# Patient Record
Sex: Male | Born: 1950
Health system: Southern US, Community
[De-identification: ages and names within clinical notes are randomized; demographics above are authoritative.]

## PROBLEM LIST (undated history)

## (undated) DIAGNOSIS — I10 Essential (primary) hypertension: Secondary | ICD-10-CM

## (undated) DIAGNOSIS — C61 Malignant neoplasm of prostate: Secondary | ICD-10-CM

## (undated) DIAGNOSIS — E785 Hyperlipidemia, unspecified: Secondary | ICD-10-CM

## (undated) DIAGNOSIS — R972 Elevated prostate specific antigen [PSA]: Secondary | ICD-10-CM

## (undated) HISTORY — DX: Malignant neoplasm of prostate: C61

## (undated) HISTORY — DX: Elevated prostate specific antigen (PSA): R97.20

## (undated) HISTORY — DX: Essential (primary) hypertension: I10

## (undated) HISTORY — PX: TONSILLECTOMY: SUR1361

## (undated) HISTORY — DX: Hyperlipidemia, unspecified: E78.5

---

## 1998-09-30 ENCOUNTER — Ambulatory Visit (HOSPITAL_COMMUNITY): Admission: RE | Admit: 1998-09-30 | Discharge: 1998-09-30 | Payer: Self-pay | Admitting: Specialist

## 1998-09-30 ENCOUNTER — Encounter: Payer: Self-pay | Admitting: Specialist

## 1999-05-11 ENCOUNTER — Encounter: Payer: Self-pay | Admitting: Orthopedic Surgery

## 1999-05-18 ENCOUNTER — Encounter: Payer: Self-pay | Admitting: Orthopedic Surgery

## 1999-05-18 ENCOUNTER — Inpatient Hospital Stay (HOSPITAL_COMMUNITY): Admission: RE | Admit: 1999-05-18 | Discharge: 1999-05-23 | Payer: Self-pay | Admitting: Orthopedic Surgery

## 1999-12-07 HISTORY — PX: TOTAL HIP ARTHROPLASTY: SHX124

## 2000-12-15 ENCOUNTER — Other Ambulatory Visit: Admission: RE | Admit: 2000-12-15 | Discharge: 2000-12-15 | Payer: Self-pay | Admitting: Internal Medicine

## 2004-12-28 ENCOUNTER — Ambulatory Visit: Payer: Self-pay | Admitting: Internal Medicine

## 2005-02-15 ENCOUNTER — Ambulatory Visit: Payer: Self-pay | Admitting: Gastroenterology

## 2005-02-26 ENCOUNTER — Ambulatory Visit: Payer: Self-pay | Admitting: Gastroenterology

## 2005-02-26 ENCOUNTER — Encounter: Payer: Self-pay | Admitting: Internal Medicine

## 2005-03-22 ENCOUNTER — Ambulatory Visit: Payer: Self-pay | Admitting: Internal Medicine

## 2005-03-29 ENCOUNTER — Ambulatory Visit: Payer: Self-pay | Admitting: Internal Medicine

## 2005-11-05 ENCOUNTER — Ambulatory Visit: Payer: Self-pay | Admitting: Internal Medicine

## 2005-12-06 HISTORY — PX: KNEE ARTHROSCOPY: SUR90

## 2006-03-01 ENCOUNTER — Ambulatory Visit: Payer: Self-pay | Admitting: Internal Medicine

## 2006-03-17 ENCOUNTER — Ambulatory Visit: Payer: Self-pay | Admitting: Internal Medicine

## 2006-09-06 ENCOUNTER — Ambulatory Visit: Payer: Self-pay | Admitting: Internal Medicine

## 2006-09-13 ENCOUNTER — Ambulatory Visit: Payer: Self-pay | Admitting: Internal Medicine

## 2006-12-06 HISTORY — PX: OTHER SURGICAL HISTORY: SHX169

## 2006-12-06 HISTORY — PX: PROSTATE BIOPSY: SHX241

## 2007-01-04 ENCOUNTER — Ambulatory Visit: Payer: Self-pay | Admitting: Internal Medicine

## 2007-01-04 LAB — CONVERTED CEMR LAB: Testosterone: 345.1 ng/dL — ABNORMAL LOW (ref 350.00–890)

## 2007-01-10 ENCOUNTER — Ambulatory Visit: Payer: Self-pay | Admitting: Internal Medicine

## 2007-01-11 LAB — CONVERTED CEMR LAB
FSH: 9.5 milliintl units/mL
LH: 13.1 milliintl units/mL
Prolactin: 11.5 ng/mL

## 2007-01-27 ENCOUNTER — Ambulatory Visit: Payer: Self-pay | Admitting: Internal Medicine

## 2007-03-14 ENCOUNTER — Ambulatory Visit: Payer: Self-pay | Admitting: Internal Medicine

## 2007-03-14 LAB — CONVERTED CEMR LAB
ALT: 35 units/L (ref 0–40)
AST: 35 units/L (ref 0–37)
Albumin: 4 g/dL (ref 3.5–5.2)
Alkaline Phosphatase: 47 units/L (ref 39–117)
BUN: 21 mg/dL (ref 6–23)
Basophils Absolute: 0 10*3/uL (ref 0.0–0.1)
Basophils Relative: 0.1 % (ref 0.0–1.0)
Bilirubin, Direct: 0.2 mg/dL (ref 0.0–0.3)
CO2: 32 meq/L (ref 19–32)
Calcium: 9.4 mg/dL (ref 8.4–10.5)
Chloride: 110 meq/L (ref 96–112)
Cholesterol: 110 mg/dL (ref 0–200)
Creatinine, Ser: 1 mg/dL (ref 0.4–1.5)
Eosinophils Absolute: 0.1 10*3/uL (ref 0.0–0.6)
Eosinophils Relative: 2.5 % (ref 0.0–5.0)
GFR calc Af Amer: 99 mL/min
GFR calc non Af Amer: 82 mL/min
Glucose, Bld: 93 mg/dL (ref 70–99)
HCT: 45.6 % (ref 39.0–52.0)
HDL: 46.1 mg/dL (ref >39.0)
Hemoglobin: 15.7 g/dL (ref 13.0–17.0)
LDL Cholesterol: 51 mg/dL (ref 0–99)
Lymphocytes Relative: 25.3 % (ref 12.0–46.0)
MCHC: 34.3 g/dL (ref 30.0–36.0)
MCV: 94.1 fL (ref 78.0–100.0)
Monocytes Absolute: 0.5 10*3/uL (ref 0.2–0.7)
Monocytes Relative: 10.4 % (ref 3.0–11.0)
Neutro Abs: 3.1 10*3/uL (ref 1.4–7.7)
Neutrophils Relative %: 61.7 % (ref 43.0–77.0)
PSA: 4.06 ng/mL — ABNORMAL HIGH (ref 0.10–4.00)
Platelets: 242 10*3/uL (ref 150–400)
Potassium: 4.2 meq/L (ref 3.5–5.1)
RBC: 4.85 M/uL (ref 4.22–5.81)
RDW: 13 % (ref 11.5–14.6)
Sodium: 149 meq/L — ABNORMAL HIGH (ref 135–145)
TSH: 1.76 microintl units/mL (ref 0.35–5.50)
Total Bilirubin: 1.1 mg/dL (ref 0.3–1.2)
Total CHOL/HDL Ratio: 2.4
Total Protein: 7.1 g/dL (ref 6.0–8.3)
Triglycerides: 64 mg/dL (ref 0–149)
VLDL: 13 mg/dL (ref 0–40)
WBC: 4.9 10*3/uL (ref 4.5–10.5)

## 2007-03-21 ENCOUNTER — Ambulatory Visit: Payer: Self-pay | Admitting: Internal Medicine

## 2007-03-21 LAB — CONVERTED CEMR LAB
PSA, Free Pct: 26 (ref >25)
PSA, Free: 1.2 ng/mL
PSA: 4.64 ng/mL — ABNORMAL HIGH (ref 0.10–4.00)

## 2007-03-31 ENCOUNTER — Ambulatory Visit: Payer: Self-pay | Admitting: Internal Medicine

## 2007-03-31 LAB — CONVERTED CEMR LAB
PSA, Free Pct: 19 — ABNORMAL LOW (ref >25)
PSA, Free: 0.7 ng/mL
PSA: 3.77 ng/mL (ref 0.10–4.00)

## 2007-05-05 ENCOUNTER — Ambulatory Visit: Payer: Self-pay | Admitting: Internal Medicine

## 2007-05-05 LAB — CONVERTED CEMR LAB
PSA, Free Pct: 18 — ABNORMAL LOW (ref >25)
PSA, Free: 0.7 ng/mL
PSA: 3.99 ng/mL (ref 0.10–4.00)

## 2007-07-06 DIAGNOSIS — E785 Hyperlipidemia, unspecified: Secondary | ICD-10-CM | POA: Insufficient documentation

## 2007-07-11 ENCOUNTER — Ambulatory Visit: Payer: Self-pay | Admitting: Internal Medicine

## 2007-07-11 LAB — CONVERTED CEMR LAB: Cholesterol: 134 mg/dL (ref 0–200)

## 2007-07-19 ENCOUNTER — Ambulatory Visit: Payer: Self-pay | Admitting: Internal Medicine

## 2007-07-19 DIAGNOSIS — K4020 Bilateral inguinal hernia, without obstruction or gangrene, not specified as recurrent: Secondary | ICD-10-CM | POA: Insufficient documentation

## 2007-07-19 DIAGNOSIS — IMO0002 Reserved for concepts with insufficient information to code with codable children: Secondary | ICD-10-CM | POA: Insufficient documentation

## 2007-07-19 DIAGNOSIS — B079 Viral wart, unspecified: Secondary | ICD-10-CM | POA: Insufficient documentation

## 2007-07-19 DIAGNOSIS — F528 Other sexual dysfunction not due to a substance or known physiological condition: Secondary | ICD-10-CM | POA: Insufficient documentation

## 2007-07-25 ENCOUNTER — Encounter: Payer: Self-pay | Admitting: Internal Medicine

## 2007-09-27 ENCOUNTER — Ambulatory Visit (HOSPITAL_BASED_OUTPATIENT_CLINIC_OR_DEPARTMENT_OTHER): Admission: RE | Admit: 2007-09-27 | Discharge: 2007-09-27 | Payer: Self-pay | Admitting: Orthopedic Surgery

## 2008-02-12 ENCOUNTER — Ambulatory Visit: Payer: Self-pay | Admitting: Internal Medicine

## 2008-02-12 LAB — CONVERTED CEMR LAB
Albumin: 4.1 g/dL (ref 3.5–5.2)
Alkaline Phosphatase: 45 units/L (ref 39–117)
BUN: 16 mg/dL (ref 6–23)
Basophils Absolute: 0 10*3/uL (ref 0.0–0.1)
Cholesterol: 134 mg/dL (ref 0–200)
Creatinine, Ser: 1 mg/dL (ref 0.4–1.5)
Eosinophils Absolute: 0.1 10*3/uL (ref 0.0–0.6)
GFR calc Af Amer: 99 mL/min
GFR calc non Af Amer: 82 mL/min
Glucose, Urine, Semiquant: NEGATIVE
HCT: 43.5 % (ref 39.0–52.0)
HDL: 52 mg/dL (ref >39.0)
Hemoglobin: 14.7 g/dL (ref 13.0–17.0)
LDL Cholesterol: 74 mg/dL (ref 0–99)
MCHC: 33.8 g/dL (ref 30.0–36.0)
MCV: 92.5 fL (ref 78.0–100.0)
Monocytes Absolute: 0.4 10*3/uL (ref 0.2–0.7)
Monocytes Relative: 8.5 % (ref 3.0–11.0)
Neutrophils Relative %: 71.8 % (ref 43.0–77.0)
PSA: 3.52 ng/mL (ref 0.10–4.00)
Potassium: 4.6 meq/L (ref 3.5–5.1)
RDW: 13.7 % (ref 11.5–14.6)
Sodium: 147 meq/L — ABNORMAL HIGH (ref 135–145)
TSH: 1.33 microintl units/mL (ref 0.35–5.50)
Total Bilirubin: 1.4 mg/dL — ABNORMAL HIGH (ref 0.3–1.2)
Total CHOL/HDL Ratio: 2.6
Urobilinogen, UA: 0.2
pH: 5.5

## 2008-02-19 ENCOUNTER — Ambulatory Visit: Payer: Self-pay | Admitting: Internal Medicine

## 2008-05-23 ENCOUNTER — Ambulatory Visit: Payer: Self-pay | Admitting: Internal Medicine

## 2008-05-23 DIAGNOSIS — L570 Actinic keratosis: Secondary | ICD-10-CM | POA: Insufficient documentation

## 2008-07-11 ENCOUNTER — Ambulatory Visit: Payer: Self-pay | Admitting: Internal Medicine

## 2008-07-11 LAB — CONVERTED CEMR LAB

## 2008-07-15 ENCOUNTER — Ambulatory Visit: Payer: Self-pay | Admitting: Internal Medicine

## 2008-07-15 DIAGNOSIS — M7710 Lateral epicondylitis, unspecified elbow: Secondary | ICD-10-CM | POA: Insufficient documentation

## 2008-08-23 ENCOUNTER — Telehealth: Payer: Self-pay | Admitting: Internal Medicine

## 2008-11-06 ENCOUNTER — Ambulatory Visit: Payer: Self-pay | Admitting: Psychology

## 2008-11-08 ENCOUNTER — Ambulatory Visit: Payer: Self-pay | Admitting: Psychology

## 2008-11-12 ENCOUNTER — Ambulatory Visit: Payer: Self-pay | Admitting: Psychology

## 2008-11-14 ENCOUNTER — Ambulatory Visit: Payer: Self-pay | Admitting: Psychology

## 2008-11-18 ENCOUNTER — Ambulatory Visit: Payer: Self-pay | Admitting: Psychology

## 2008-11-19 ENCOUNTER — Ambulatory Visit: Payer: Self-pay | Admitting: Psychology

## 2008-11-21 ENCOUNTER — Ambulatory Visit: Payer: Self-pay | Admitting: Psychology

## 2008-11-26 ENCOUNTER — Ambulatory Visit: Payer: Self-pay | Admitting: Psychology

## 2008-12-16 ENCOUNTER — Ambulatory Visit: Payer: Self-pay | Admitting: Internal Medicine

## 2008-12-16 LAB — CONVERTED CEMR LAB
ALT: 45 units/L (ref 0–53)
Basophils Absolute: 0 10*3/uL (ref 0.0–0.1)
Basophils Relative: 0.6 % (ref 0.0–3.0)
Bilirubin Urine: NEGATIVE
CO2: 30 meq/L (ref 19–32)
Calcium: 9.4 mg/dL (ref 8.4–10.5)
Cholesterol: 121 mg/dL (ref 0–200)
Creatinine, Ser: 0.6 mg/dL (ref 0.4–1.5)
GFR calc Af Amer: 178 mL/min
Glucose, Bld: 99 mg/dL (ref 70–99)
Glucose, Urine, Semiquant: NEGATIVE
HCT: 40.9 % (ref 39.0–52.0)
Hemoglobin: 14.7 g/dL (ref 13.0–17.0)
LDL Cholesterol: 59 mg/dL (ref 0–99)
Lymphocytes Relative: 17.3 % (ref 12.0–46.0)
MCHC: 35.8 g/dL (ref 30.0–36.0)
Monocytes Absolute: 0.6 10*3/uL (ref 0.1–1.0)
Monocytes Relative: 10.8 % (ref 3.0–12.0)
Neutro Abs: 3.8 10*3/uL (ref 1.4–7.7)
PSA: 5.31 ng/mL — ABNORMAL HIGH (ref 0.10–4.00)
RBC: 4.44 M/uL (ref 4.22–5.81)
Specific Gravity, Urine: >=1.03
TSH: 1.59 microintl units/mL (ref 0.35–5.50)
Total Protein: 7.2 g/dL (ref 6.0–8.3)
Triglycerides: 42 mg/dL (ref 0–149)
Urobilinogen, UA: 0.2

## 2008-12-20 ENCOUNTER — Ambulatory Visit: Payer: Self-pay | Admitting: Internal Medicine

## 2008-12-20 DIAGNOSIS — R972 Elevated prostate specific antigen [PSA]: Secondary | ICD-10-CM | POA: Insufficient documentation

## 2009-02-14 ENCOUNTER — Ambulatory Visit: Payer: Self-pay | Admitting: Internal Medicine

## 2009-02-14 LAB — CONVERTED CEMR LAB: PSA: 4.69 ng/mL — ABNORMAL HIGH (ref 0.10–4.00)

## 2009-03-31 ENCOUNTER — Telehealth: Payer: Self-pay | Admitting: Internal Medicine

## 2009-12-15 ENCOUNTER — Telehealth: Payer: Self-pay | Admitting: Internal Medicine

## 2009-12-22 ENCOUNTER — Ambulatory Visit: Payer: Self-pay | Admitting: Internal Medicine

## 2009-12-22 DIAGNOSIS — M549 Dorsalgia, unspecified: Secondary | ICD-10-CM | POA: Insufficient documentation

## 2010-01-13 ENCOUNTER — Encounter (INDEPENDENT_AMBULATORY_CARE_PROVIDER_SITE_OTHER): Payer: Self-pay | Admitting: *Deleted

## 2010-03-05 ENCOUNTER — Encounter (INDEPENDENT_AMBULATORY_CARE_PROVIDER_SITE_OTHER): Payer: Self-pay | Admitting: *Deleted

## 2010-03-09 ENCOUNTER — Ambulatory Visit: Payer: Self-pay | Admitting: Gastroenterology

## 2010-03-13 ENCOUNTER — Ambulatory Visit: Payer: Self-pay | Admitting: Internal Medicine

## 2010-03-13 LAB — CONVERTED CEMR LAB
AST: 27 units/L (ref 0–37)
Albumin: 4.2 g/dL (ref 3.5–5.2)
Alkaline Phosphatase: 49 units/L (ref 39–117)
Basophils Absolute: 0 10*3/uL (ref 0.0–0.1)
Blood in Urine, dipstick: NEGATIVE
Calcium: 9.6 mg/dL (ref 8.4–10.5)
GFR calc non Af Amer: 104.74 mL/min (ref >60)
Glucose, Bld: 103 mg/dL — ABNORMAL HIGH (ref 70–99)
HDL: 45 mg/dL (ref >39.00)
Hemoglobin: 15.5 g/dL (ref 13.0–17.0)
LDL Cholesterol: 89 mg/dL (ref 0–99)
Lymphocytes Relative: 20.2 % (ref 12.0–46.0)
Monocytes Relative: 8.8 % (ref 3.0–12.0)
Neutro Abs: 4.6 10*3/uL (ref 1.4–7.7)
Nitrite: NEGATIVE
RDW: 16.2 % — ABNORMAL HIGH (ref 11.5–14.6)
Sodium: 147 meq/L — ABNORMAL HIGH (ref 135–145)
Total Bilirubin: 0.8 mg/dL (ref 0.3–1.2)
Total CHOL/HDL Ratio: 3
Urobilinogen, UA: 0.2
VLDL: 8 mg/dL (ref 0.0–40.0)
WBC Urine, dipstick: NEGATIVE
WBC: 6.6 10*3/uL (ref 4.5–10.5)

## 2010-03-18 ENCOUNTER — Telehealth: Payer: Self-pay | Admitting: Gastroenterology

## 2010-03-19 ENCOUNTER — Ambulatory Visit: Payer: Self-pay | Admitting: Gastroenterology

## 2010-03-19 LAB — HM COLONOSCOPY

## 2010-03-20 ENCOUNTER — Ambulatory Visit: Payer: Self-pay | Admitting: Internal Medicine

## 2010-03-23 ENCOUNTER — Encounter: Payer: Self-pay | Admitting: Gastroenterology

## 2010-04-23 ENCOUNTER — Ambulatory Visit: Payer: Self-pay | Admitting: Internal Medicine

## 2010-04-23 LAB — CONVERTED CEMR LAB
PSA, Free Pct: 13 — ABNORMAL LOW (ref >25)
PSA, Free: 0.7 ng/mL

## 2010-05-06 ENCOUNTER — Telehealth: Payer: Self-pay | Admitting: Internal Medicine

## 2010-05-07 ENCOUNTER — Encounter: Payer: Self-pay | Admitting: Internal Medicine

## 2010-09-11 ENCOUNTER — Ambulatory Visit: Payer: Self-pay | Admitting: Internal Medicine

## 2010-09-11 LAB — CONVERTED CEMR LAB
Chloride: 106 meq/L (ref 96–112)
Potassium: 4.1 meq/L (ref 3.5–5.1)

## 2010-09-15 LAB — CONVERTED CEMR LAB
PSA, Free: 0.8 ng/mL
PSA: 5.64 ng/mL — ABNORMAL HIGH (ref 0.10–4.00)

## 2010-09-18 ENCOUNTER — Ambulatory Visit: Payer: Self-pay | Admitting: Internal Medicine

## 2010-09-25 ENCOUNTER — Telehealth: Payer: Self-pay | Admitting: Internal Medicine

## 2010-10-01 ENCOUNTER — Telehealth: Payer: Self-pay | Admitting: Internal Medicine

## 2010-10-05 ENCOUNTER — Telehealth: Payer: Self-pay | Admitting: Internal Medicine

## 2010-12-30 ENCOUNTER — Telehealth: Payer: Self-pay | Admitting: Internal Medicine

## 2011-01-05 NOTE — Letter (Signed)
Summary: Patient Notice- Polyp Results  [facility]  [address] [place], [place] [address]   Phone: [phone]  Fax: [phone]        March 23, 2010 MRN: [id number]    Ruben Chavez [address] [place], [place]  [address]    Dear [name],  I am pleased to inform you that the colon polyp(s) removed during your recent colonoscopy was (were) found to be benign (no cancer detected) upon pathologic examination.  I recommend you have a repeat colonoscopy examination in 3 years to look for recurrent polyps, as having colon polyps increases your risk for having recurrent polyps or even colon cancer in the future.  Should you develop new or worsening symptoms of abdominal pain, bowel habit changes or bleeding from the rectum or bowels, please schedule an evaluation with either your primary care physician or with me.  Continue treatment plan as outlined the day of your exam.  Please call us if you are having persistent problems or have questions about your condition that have not been fully answered at this time.  Sincerely,  Meryl Dare MD [facility]  This letter has been electronically signed by your physician.  Appended Document: Patient Notice- Polyp Results letter mailed 4.20.11

## 2011-01-05 NOTE — Assessment & Plan Note (Signed)
Summary: 6 MONTH ROV/NJR/pt rescd from bump//ccm   Vital Signs:  Patient profile:   60 year old male Height:      70 inches Weight:      192 pounds BMI:     27.65 Temp:     98.2 degrees F oral Pulse rate:   76 / minute Resp:     14 per minute BP sitting:   144 / 80  (left arm)  Vitals Entered By: Willy Eddy, LPN (September 18, 2010 11:30 AM)  Contraindications/Deferment of Procedures/Staging:    Test/Procedure: FLU VAX    Reason for deferment: patient declined  CC: roa labs Is Patient Diabetic? No   CC:  roa labs.  History of Present Illness: The pt had  PSAs was elevated to 7.49 then dropped to 5.47, now 5.64 the pt has no family hx of prostat risk pts age 3 hx of prostatitis seeing urology bx  in 2008 negative  Preventive Screening-Counseling & Management  Alcohol-Tobacco     Smoking Status: quit     Tobacco Counseling: to remain off tobacco products  Problems Prior to Update: 1)  Back Pain, Left  (ICD-724.5) 2)  Prostate Specific Antigen, Elevated  (ICD-790.93) 3)  Lateral Epicondylitis  (ICD-726.32) 4)  Actinic Keratosis, Forehead, Left  (ICD-702.0) 5)  Preventive Health Care  (ICD-V70.0) 6)  Wart, Left Hand  (ICD-078.10) 7)  Erectile Dysfunction, Mild  (ICD-302.72) 8)  Meniscus Tear  (ICD-836.2) 9)  Hernia, Bilateral Inguinal w/o Obst/gangrene  (ICD-550.92) 10)  Psa, Increased  (ICD-790.93) 11)  Hyperlipidemia  (ICD-272.4)  Current Problems (verified): 1)  Back Pain, Left  (ICD-724.5) 2)  Prostate Specific Antigen, Elevated  (ICD-790.93) 3)  Lateral Epicondylitis  (ICD-726.32) 4)  Actinic Keratosis, Forehead, Left  (ICD-702.0) 5)  Preventive Health Care  (ICD-V70.0) 6)  Wart, Left Hand  (ICD-078.10) 7)  Erectile Dysfunction, Mild  (ICD-302.72) 8)  Meniscus Tear  (ICD-836.2) 9)  Hernia, Bilateral Inguinal w/o Obst/gangrene  (ICD-550.92) 10)  Psa, Increased  (ICD-790.93) 11)  Hyperlipidemia  (ICD-272.4)  Medications Prior to Update: 1)   Multivitamins   Tabs (Multiple Vitamin) .... Once Daily 2)  Vitamin C 250 Mg  Chew (Ascorbic Acid) .... Once Daily 3)  Niacin Flush Free 500 Mg Caps (Inositol Niacinate) .... One By Mouth Daily 4)  Viagra 100 Mg Tabs (Sildenafil Citrate) .... Take As Directed One Hour Before Activity  Current Medications (verified): 1)  Multivitamins   Tabs (Multiple Vitamin) .... Once Daily 2)  Vitamin C 250 Mg  Chew (Ascorbic Acid) .... Once Daily 3)  Niacin Flush Free 500 Mg Caps (Inositol Niacinate) .... One By Mouth Daily 4)  Viagra 100 Mg Tabs (Sildenafil Citrate) .... Take As Directed One Hour Before Activity 5)  Lamisil 250 Mg Tabs (Terbinafine Hcl) .Marland Kitchen.. 1 Once Daily For 90 Days 6)  Doxycycline Hyclate 100 Mg Caps (Doxycycline Hyclate) .... One By Mouth Daily  Allergies (verified): 1)  ! Betadine  Past History:  Social History: Last updated: 07/19/2007 Married Former Smoker  Risk Factors: Smoking Status: quit (09/18/2010)  Past medical, surgical, family and social histories (including risk factors) reviewed, and no changes noted (except as noted below).  Past Medical History: Reviewed history from 07/06/2007 and no changes required. Hyperlipidemia elevated psa 790.93  Past Surgical History: Reviewed history from 07/19/2007 and no changes required. Total hip replacement  Family History: Reviewed history and no changes required.  Social History: Reviewed history from 07/19/2007 and no changes required. Married Former Smoker  Review  of Systems  The patient denies anorexia, fever, weight loss, weight gain, vision loss, decreased hearing, hoarseness, chest pain, syncope, dyspnea on exertion, peripheral edema, prolonged cough, headaches, hemoptysis, abdominal pain, melena, hematochezia, severe indigestion/heartburn, hematuria, incontinence, genital sores, muscle weakness, suspicious skin lesions, transient blindness, difficulty walking, depression, unusual weight change, abnormal  bleeding, enlarged lymph nodes, angioedema, breast masses, and testicular masses.         Flu Vaccine Consent Questions     Do you have a history of severe allergic reactions to this vaccine? no    Any prior history of allergic reactions to egg and/or gelatin? no    Do you have a sensitivity to the preservative Thimersol? no    Do you have a past history of Guillan-Barre Syndrome? no    Do you currently have an acute febrile illness? no    Have you ever had a severe reaction to latex? no    Vaccine information given and explained to patient? yes    Are you currently pregnant? no    Lot Number:[id number]   Exp Date:06/05/2011   Site Given  Left Deltoid IM   Physical Exam  General:  Well-developed,well-nourished,in no acute distress; alert,appropriate and cooperative throughout examination Head:  male-pattern balding.   Eyes:  pupils equal and pupils round.   Ears:  R ear normal and L ear normal.   Nose:  no external deformity and no nasal discharge.   Mouth:  pharynx pink and moist and no erythema.   Neck:  No deformities, masses, or tenderness noted. Lungs:  normal respiratory effort and no wheezes.   Heart:  normal rate and regular rhythm.   Abdomen:  Bowel sounds positive,abdomen soft and non-tender without masses, organomegaly or hernias noted. Msk:  joint replacment Pulses:  R and L carotid,radial,femoral,dorsalis pedis and posterior tibial pulses are full and equal bilaterally Extremities:  No clubbing, cyanosis, edema, or deformity noted with normal full range of motion of all joints.   Neurologic:  No cranial nerve deficits noted. Station and gait are normal. Plantar reflexes are down-going bilaterally. DTRs are symmetrical throughout. Sensory, motor and coordinative functions appear intact.   Impression & Recommendations:  Problem # 1:  PROSTATE SPECIFIC ANTIGEN, ELEVATED (ICD-790.93) Assessment Unchanged monitering and review of the urologist consult with pt pt askes to  have primary care porceed with monitering priveious response to ABX suggest inflection and inflamatoru process  Problem # 2:  HYPERLIPIDEMIA (ICD-272.4) Assessment: Unchanged  Labs Reviewed: SGOT: 27 (03/13/2010)   SGPT: 29 (03/13/2010)   HDL:45.00 (03/13/2010), 53.3 (12/16/2008)  LDL:89 (03/13/2010), 59 (12/16/2008)  Chol:142 (03/13/2010), 121 (12/16/2008)  Trig:40.0 (03/13/2010), 42 (12/16/2008)  Problem # 3:  BACK PAIN, LEFT (ICD-724.5)  Discussed use of moist heat or ice, modified activities, medications, and stretching/strengthening exercises. Back care instructions given. To be seen in 2 weeks if no improvement; sooner if worsening of symptoms.   Complete Medication List: 1)  Multivitamins Tabs (Multiple vitamin) .... Once daily 2)  Vitamin C 250 Mg Chew (Ascorbic acid) .... Once daily 3)  Niacin Flush Free 500 Mg Caps (Inositol niacinate) .... One by mouth daily 4)  Viagra 100 Mg Tabs (Sildenafil citrate) .... Take as directed one hour before activity 5)  Lamisil 250 Mg Tabs (Terbinafine hcl) .Marland Kitchen.. 1 once daily for 90 days 6)  Doxycycline Hyclate 100 Mg Caps (Doxycycline hyclate) .... One by mouth daily  Other Orders: Admin 1st Vaccine ([address]) Flu Vaccine 67yrs + [phone])  Patient Instructions: 1)  CPX in march  Prescriptions: DOXYCYCLINE HYCLATE 100 MG CAPS (DOXYCYCLINE HYCLATE) one by mouth daily  #90 x 1   Entered and Authorized by:   Stacie Glaze MD   Signed by:   Stacie Glaze MD on 09/18/2010   Method used:   Electronically to        [facility] (mail-order)             , [place]         Ph: [phone]       Fax: [phone]   RxID:   [id number]

## 2011-01-05 NOTE — Assessment & Plan Note (Signed)
Summary: back pain/dm   Vital Signs:  Patient profile:   60 year old male Height:      70 inches Weight:      192 pounds BMI:     27.65 Temp:     98.2 degrees F oral Pulse rate:   76 / minute Resp:     14 per minute BP sitting:   146 / 84  (left arm)  Vitals Entered By: Willy Eddy, LPN (December 22, 2009 12:16 PM) CC: c/o low back pain-vicodan helped some but still painful   CC:  c/o low back pain-vicodan helped some but still painful.  History of Present Illness: The primary complaint is MSK pain The pt has a floater that was worrisome The pt had been sneezing and has a vitrious detachment Had the flu and has a dental infections back pain ( hx since "college injury")  hx of a chipped bone in lumbar back sharp pain  10/10 acute now 6/10 differential includes prostate and disc   Preventive Screening-Counseling & Management  Alcohol-Tobacco     Smoking Status: quit  Problems Prior to Update: 1)  Prostate Specific Antigen, Elevated  (ICD-790.93) 2)  Lateral Epicondylitis  (ICD-726.32) 3)  Actinic Keratosis, Forehead, Left  (ICD-702.0) 4)  Preventive Health Care  (ICD-V70.0) 5)  Wart, Left Hand  (ICD-078.10) 6)  Erectile Dysfunction, Mild  (ICD-302.72) 7)  Meniscus Tear  (ICD-836.2) 8)  Hernia, Bilateral Inguinal w/o Obst/gangrene  (ICD-550.92) 9)  Psa, Increased  (ICD-790.93) 10)  Hyperlipidemia  (ICD-272.4)  Medications Prior to Update: 1)  Multivitamins   Tabs (Multiple Vitamin) .... Once Daily 2)  Vitamin C 250 Mg  Chew (Ascorbic Acid) .... Once Daily 3)  No Flush Niacin 100-400 Mg  Caps (Niacin-Inositol) .... Once Daily 4)  Viagra 100 Mg Tabs (Sildenafil Citrate) .... Take As Directed One Hour Before Activity 5)  Doxycycline Hyclate 100 Mg Caps (Doxycycline Hyclate) .... One By Mouth Bid 6)  Meridia 15 Mg Caps (Sibutramine Hcl Monohydrate) .Marland Kitchen.. 1 Once Daily 7)  Vicodin 5-500 Mg Tabs (Hydrocodone-Acetaminophen) .... One By Mouth Q 4 Hour As Needed Back  Pain  Current Medications (verified): 1)  Multivitamins   Tabs (Multiple Vitamin) .... Once Daily 2)  Vitamin C 250 Mg  Chew (Ascorbic Acid) .... Once Daily 3)  No Flush Niacin 100-400 Mg  Caps (Niacin-Inositol) .... Once Daily 4)  Viagra 100 Mg Tabs (Sildenafil Citrate) .... Take As Directed One Hour Before Activity 5)  Vicodin 5-500 Mg Tabs (Hydrocodone-Acetaminophen) .... One By Mouth Q 4 Hour As Needed Back Pain  Allergies (verified): No Known Drug Allergies  Past History:  Social History: Last updated: 07/19/2007 Married Former Smoker  Risk Factors: Smoking Status: quit (12/22/2009)  Past medical, surgical, family and social histories (including risk factors) reviewed, and no changes noted (except as noted below).  Past Medical History: Reviewed history from 07/06/2007 and no changes required. Hyperlipidemia elevated psa 790.93  Past Surgical History: Reviewed history from 07/19/2007 and no changes required. Total hip replacement  Family History: Reviewed history and no changes required.  Social History: Reviewed history from 07/19/2007 and no changes required. Married Former Smoker  Review of Systems       The patient complains of weight gain.  The patient denies anorexia, fever, weight loss, vision loss, decreased hearing, hoarseness, chest pain, syncope, dyspnea on exertion, peripheral edema, prolonged cough, headaches, hemoptysis, abdominal pain, melena, hematochezia, severe indigestion/heartburn, hematuria, incontinence, genital sores, muscle weakness, suspicious skin lesions, transient blindness, difficulty  walking, depression, unusual weight change, abnormal bleeding, enlarged lymph nodes, angioedema, and breast masses.    Physical Exam  General:  Well-developed,well-nourished,in no acute distress; alert,appropriate and cooperative throughout examination Head:  male-pattern balding.   Eyes:  pupils equal and pupils round.   Ears:  R ear normal and L ear  normal.   Nose:  no external deformity and no nasal discharge.   Mouth:  pharynx pink and moist and no erythema.   Neck:  No deformities, masses, or tenderness noted. Lungs:  normal respiratory effort and no wheezes.   Heart:  normal rate and regular rhythm.   Abdomen:  Bowel sounds positive,abdomen soft and non-tender without masses, organomegaly or hernias noted. Msk:  joint replacment Pulses:  R and L carotid,radial,femoral,dorsalis pedis and posterior tibial pulses are full and equal bilaterally Extremities:  No clubbing, cyanosis, edema, or deformity noted with normal full range of motion of all joints.   Neurologic:  No cranial nerve deficits noted. Station and gait are normal. Plantar reflexes are down-going bilaterally. DTRs are symmetrical throughout. Sensory, motor and coordinative functions appear intact.   Impression & Recommendations:  Problem # 1:  BACK PAIN, LEFT (ICD-724.5)  in the SI  joint area with  locations more toawrd the left and not radicular His updated medication list for this problem includes:    Hydrocodone-acetaminophen 7.5-650 Mg Tabs (Hydrocodone-acetaminophen) ..... One by mouth q 6 hour prn    Cyclobenzaprine Hcl 5 Mg Tabs (Cyclobenzaprine hcl) ..... One by mouth three times a day  Discussed use of moist heat or ice, modified activities, medications, and stretching/strengthening exercises. Back care instructions given. To be seen in 2 weeks if no improvement; sooner if worsening of symptoms.   Complete Medication List: 1)  Multivitamins Tabs (Multiple vitamin) .... Once daily 2)  Vitamin C 250 Mg Chew (Ascorbic acid) .... Once daily 3)  Niacin Flush Free 500 Mg Caps (Inositol niacinate) .... One by mouth daily 4)  Viagra 100 Mg Tabs (Sildenafil citrate) .... Take as directed one hour before activity 5)  Hydrocodone-acetaminophen 7.5-650 Mg Tabs (Hydrocodone-acetaminophen) .... One by mouth q 6 hour prn 6)  Cyclobenzaprine Hcl 5 Mg Tabs (Cyclobenzaprine  hcl) .... One by mouth three times a day  Patient Instructions: 1)  Please schedule a follow-up appointment in 3 months.  CPX Prescriptions: NIACIN FLUSH FREE 500 MG CAPS (INOSITOL NIACINATE) one by mouth daily  #30 x 11   Entered and Authorized by:   Stacie Glaze MD   Signed by:   Stacie Glaze MD on 12/22/2009   Method used:   Print then Give to Patient   RxID:   [id number] HYDROCODONE-ACETAMINOPHEN 7.5-650 MG TABS (HYDROCODONE-ACETAMINOPHEN) one by mouth q 6 hour prn  #30 x 2   Entered and Authorized by:   Stacie Glaze MD   Signed by:   Stacie Glaze MD on 12/22/2009   Method used:   Print then Give to Patient   RxID:   [id number] CYCLOBENZAPRINE HCL 5 MG TABS (CYCLOBENZAPRINE HCL) one by mouth three times a day  #30 x 0   Entered and Authorized by:   Stacie Glaze MD   Signed by:   Stacie Glaze MD on 12/22/2009   Method used:   [facility] [phone]* (retail)       [address]       [place]  [place], [place]  [address]       Ph: [phone]       Fax: [phone]   RxID:   [id number]

## 2011-01-05 NOTE — Progress Notes (Signed)
Summary: prep ?'s   Phone Note Call from Patient Call back at Home Phone [phone]   Caller: Patient Call For: Dr. Russella Dar Reason for Call: Talk to Nurse Summary of Call: prep ?'s Initial call taken by: Vallarie Mare,  March 18, 2010 2:11 PM  Follow-up for Phone Call        Phone call returned to pt.  Pt had a tooth extraction today and he took 1 hydrocodon today.  Pt questions if this was okay to take the med and if he can proceed with the procedure.  I told pt it is okay to take the hydrocodon  if he needed it and if he wants to proceed with the colonoscopy [place] it would be fine.  I also spoke with Jennye Boroughs, RN about this matter. Follow-up by: Eual Fines RN,  March 18, 2010 3:50 PM

## 2011-01-05 NOTE — Progress Notes (Signed)
Summary: med never received  Phone Note Refill Request   Refills Requested: Medication #1:  DOXYCYCLINE HYCLATE 100 MG CAPS one by mouth daily. #90 with 1 [facility] [phone]  they never received e-script  Initial call taken by: Heron Sabins,  September 25, 2010 11:36 AM  Follow-up for Phone Call        please let pt k now i sent it again Follow-up by: Willy Eddy, LPN,  September 25, 2010 11:59 AM  Additional Follow-up for Phone Call Additional follow up Details #1::        pt is aware Additional Follow-up by: Heron Sabins,  September 25, 2010 12:16 PM    Prescriptions: DOXYCYCLINE HYCLATE 100 MG CAPS (DOXYCYCLINE HYCLATE) one by mouth daily  #90 x 1   Entered by:   Willy Eddy, LPN   Authorized by:   Stacie Glaze MD   Signed by:   Willy Eddy, LPN on 78/29/5621   Method used:   Electronically to        [facility] [address] ([facility]order)       [facility]       [address]       [place], [place]  [address]       Ph: [phone]       Fax: [phone]   RxID:   [id number]

## 2011-01-05 NOTE — Miscellaneous (Signed)
Summary: [facility]  Clinical Lists Changes  Medications: Added new medication of MOVIPREP 100 GM  SOLR (PEG-KCL-NACL-NASULF-NA ASC-C) As per prep instructions. - Signed Rx of MOVIPREP 100 GM  SOLR (PEG-KCL-NACL-NASULF-NA ASC-C) As per prep instructions.;  #1 x 0;  Signed;  Entered by: Ezra Sites RN;  Authorized by: Meryl Dare MD [facility];  Method used: [facility] [phone]*, [address] [place], [place], [place]  [address], Ph: [phone], Fax: [phone] Allergies: Added new allergy or adverse reaction of BETADINE Observations: Added new observation of NKA: F (03/09/2010 14:10)    Prescriptions: MOVIPREP 100 GM  SOLR (PEG-KCL-NACL-NASULF-NA ASC-C) As per prep instructions.  #1 x 0   Entered by:   Ezra Sites RN   Authorized by:   Meryl Dare MD [facility]   Signed by:   Ezra Sites RN on 03/09/2010   Method used:   [facility]ically to        [facility] [phone]* (retail)       [address]       [place], [place]  [address]       Ph: [phone]       Fax: [phone]   RxID:   [id number]

## 2011-01-05 NOTE — Letter (Signed)
Summary: [facility] Instructions  [facility]  [address] [place], [place] [address]   Phone: [phone]  Fax: [phone]       Ruben Chavez    06-Aug-1951    MRN: [id number]        Procedure Day /Date:  Thursday 03/19/10     Arrival Time:  10:30 AM      Procedure Time:  11:30 AM     Location of Procedure:                    _X_  [facility] (4th Floor)  PREPARATION FOR COLONOSCOPY WITH MOVIPREP   Starting 5 days prior to your procedure Saturday 4/9/11_ do not eat nuts, seeds, popcorn, corn, beans, peas,  salads, or any raw vegetables.  Do not take any fiber supplements (e.g. Metamucil, Citrucel, and Benefiber).  THE DAY BEFORE YOUR PROCEDURE         DATE: 4/13/11_  DAY: Wednesday_  1.  Drink clear liquids the entire day-NO SOLID FOOD  2.  Do not drink anything colored red or purple.  Avoid juices with pulp.  No orange juice.  3.  Drink at least 64 oz. (8 glasses) of fluid/clear liquids during the day to prevent dehydration and help the prep work efficiently.  CLEAR LIQUIDS INCLUDE: Water Jello Ice Popsicles Tea (sugar ok, no milk/cream) Powdered fruit flavored drinks Coffee (sugar ok, no milk/cream) Gatorade Juice: apple, white grape, white cranberry  Lemonade Clear bullion, consomm, broth Carbonated beverages (any kind) Strained chicken noodle soup Hard Candy                             4.  In the morning, mix first dose of MoviPrep solution:    Empty 1 Pouch A and 1 Pouch B into the disposable container    Add lukewarm drinking water to the top line of the container. Mix to dissolve    Refrigerate (mixed solution should be used within 24 hrs)  5.  Begin drinking the prep at 5:00 p.m. The MoviPrep container is divided by 4 marks.   Every 15 minutes drink the solution down to the next mark (approximately 8 oz) until the full liter is complete.   6.  Follow completed prep with 16 oz of clear liquid of your choice (Nothing red  or purple).  Continue to drink clear liquids until bedtime.  7.  Before going to bed, mix second dose of MoviPrep solution:    Empty 1 Pouch A and 1 Pouch B into the disposable container    Add lukewarm drinking water to the top line of the container. Mix to dissolve    Refrigerate  THE DAY OF YOUR PROCEDURE      DATE: 03/19/10 DAY: Thursday  Beginning at 6:30 a.m. (5 hours before procedure):         1. Every 15 minutes, drink the solution down to the next mark (approx 8 oz) until the full liter is complete.  2. Follow completed prep with 16 oz. of clear liquid of your choice.    3. You may drink clear liquids until 9:30 AM (2 HOURS BEFORE PROCEDURE).   MEDICATION INSTRUCTIONS  Unless otherwise instructed, you should take regular prescription medications with a small sip of water   as early as possible the morning of your procedure.          OTHER INSTRUCTIONS  You will need a  responsible adult at least 61 years of age to accompany you and drive you home.   This person must remain in the waiting room during your procedure.  Wear loose fitting clothing that is easily removed.  Leave jewelry and other valuables at home.  However, you may wish to bring a book to read or  an iPod/MP3 player to listen to music as you wait for your procedure to start.  Remove all body piercing jewelry and leave at home.  Total time from sign-in until discharge is approximately 2-3 hours.  You should go home directly after your procedure and rest.  You can resume normal activities the  day after your procedure.  The day of your procedure you should not:   Drive   Make legal decisions   Operate machinery   Drink alcohol   Return to work  You will receive specific instructions about eating, activities and medications before you leave.    The above instructions have been reviewed and explained to me by   Ezra Sites RN  March 09, 2010 2:32 PM     I fully understand and can  verbalize these instructions _____________________________ Date _________

## 2011-01-05 NOTE — Progress Notes (Signed)
Summary: please resend rx  Phone Note Call from Patient Call back at Home Phone [phone]   Caller: Patient--live call Summary of Call: Pt called again this morning and said that his rx was not received by [facility] on 10-01-2010. please either fax again to the number or e-scibe. Pt also mentioned that he has a hard copy, but may take a long time to get there. please call pt when done.  Initial call taken by: Warnell Forester,  October 05, 2010 11:26 AM  Follow-up for Phone Call        pt informed fax was confirmed--he will mail hard copy Follow-up by: Willy Eddy, LPN,  October 05, 2010 11:30 AM

## 2011-01-05 NOTE — Procedures (Signed)
Summary: Colonoscopy  Patient: Ruben Chavez Note: All result statuses are Final unless otherwise noted.  Tests: (1) Colonoscopy (COL)   COL Colonoscopy           DONE     [facility]     [address]     [place], [place]  [address]           COLONOSCOPY PROCEDURE REPORT           PATIENT:  Ruben Chavez, Ruben Chavez  MR#:  [id number]     BIRTHDATE:  1951/01/16, 59 yrs. old  GENDER:  male           ENDOSCOPIST:  Judie Petit T. Russella Dar, MD, [facility]           PROCEDURE DATE:  03/19/2010     PROCEDURE:  Colonoscopy with biopsy and snare polypectomy     ASA CLASS:  Class II     INDICATIONS:  1) follow-up of polyp, surveillance and high-risk     screening, adenomatous polyp, 02/2002.           MEDICATIONS:   Fentanyl 75 mcg IV, Versed 7 mg IV           DESCRIPTION OF PROCEDURE:   After the risks benefits and     alternatives of the procedure were thoroughly explained, informed     consent was obtained.  Digital rectal exam was performed and     revealed no abnormalities.   The LB [id number]-[id number] endoscope     was introduced through the anus and advanced to the cecum, which     was identified by both the appendix and ileocecal valve, without     limitations.  The quality of the prep was good, using MoviPrep.     The instrument was then slowly withdrawn as the colon was fully     examined.     <<PROCEDUREIMAGES>>           FINDINGS:  A normal appearing cecum, ileocecal valve, and     appendiceal orifice were identified. The ascending, hepatic     flexure, transverse, splenic flexure, descending appeared     unremarkable.  Two polyps were found in the sigmoid colon. They     were 5 mm in size. Polyps were snared without cautery. Retrieval     was successful. Five polyps were found in the sigmoid colon. They     were 3 mm in size. The polyps were removed using cold biopsy     forceps.  Two polyps were found in the rectum. They were 4 - 5 mm     in size. Polyps were snared without  cautery. Retrieval was     successful.    Retroflexed views in the rectum revealed internal     hemorrhoids, small. The time to cecum =  3.75  minutes. The scope     was then withdrawn (time =  15.5  min) from the patient and the     procedure completed.           COMPLICATIONS:  None           ENDOSCOPIC IMPRESSION:     1) 5 mm, two polyps in the sigmoid colon     2) 3 mm, five polyps in the sigmoid colon     3) 4 - 5 mm, two polyps in the rectum     4) Internal hemorrhoids           RECOMMENDATIONS:  1) Await pathology results     2) Repeat Colonscopy in 3-5 years pending pathology review.           Venita Lick. Russella Dar, MD, [facility]           CC: Stacie Glaze, MD           n.     Rosalie DoctorVenita Lick. Sumi Lye at 03/19/2010 12:05 PM           [place], [employer], [id number]  Note: An exclamation mark (!) indicates a result that was not dispersed into the flowsheet. Document Creation Date: 03/19/2010 12:05 PM _______________________________________________________________________  (1) Order result status: Final Collection or observation date-time: 03/19/2010 11:59 Requested date-time:  Receipt date-time:  Reported date-time:  Referring Physician:   Ordering Physician: Claudette Head [phone]) Specimen Source:  Source: Launa Grill Order Number: [id number] Lab site:   Appended Document: Colonoscopy     Procedures Next Due Date:    Colonoscopy: 03/2013

## 2011-01-05 NOTE — Letter (Signed)
Summary: Colonoscopy Letter  [facility]  [address] [place], [place] [address]   Phone: [phone]  Fax: [phone]      January 13, 2010 MRN: [id number]   ELISA SORLIE [address] [place], [place]  [address]   Dear Mr. Piccini,   According to your medical record, it is time for you to schedule a Colonoscopy. The American Cancer Society recommends this procedure as a method to detect early colon cancer. Patients with a family history of colon cancer, or a personal history of colon polyps or inflammatory bowel disease are at increased risk.  This letter has beeen generated based on the recommendations made at the time of your procedure. If you feel that in your particular situation this may no longer apply, please contact our office.  Please call our office at [phone] to schedule this appointment or to update your records at your earliest convenience.  Thank you for cooperating with [place] to provide you with the very best care possible.   Sincerely,  Judie Petit T. Russella Dar, M.D.  [facility] [phone]

## 2011-01-05 NOTE — Consult Note (Signed)
Summary: [facility]  [facility]   Imported By: Maryln Gottron 05/13/2010 15:06:17  _____________________________________________________________________  External Attachment:    Type:   Image     Comment:   External Document

## 2011-01-05 NOTE — Progress Notes (Signed)
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  Really needs to see Dr. Lovell Sheehan today. [facility], [address]. [phone] Initial call taken by: Lynann Beaver CMA,  December 15, 2009 11:55 AM  Follow-up for Phone Call        [facility] have vicodan 1 every 4-6 hours as needed pain#30 and ov on tuesday pm Follow-up by: Willy Eddy, LPN,  December 15, 2009 11:59 AM    New/Updated Medications: VICODIN 5-500 MG TABS (HYDROCODONE-ACETAMINOPHEN) one by mouth q 4 hour as needed back pain Prescriptions: VICODIN 5-500 MG TABS (HYDROCODONE-ACETAMINOPHEN) one by mouth q 4 hour as needed back pain  #30 x 0   Entered by:   Lynann Beaver CMA   Authorized by:   Stacie Glaze MD   Signed by:   Lynann Beaver CMA on 12/15/2009   Method used:   Telephoned to ...       [facility] [address] [facility] [phone]* (retail)       [address] [place], [place]  [address]       Ph: [phone]       Fax: [phone]   RxID:   [id number]  Pt. notified but he cannot come in tomorrow.

## 2011-01-05 NOTE — Progress Notes (Signed)
  Phone Note Call from Patient   Summary of Call: pt called again stating [facility] never received script----I called them and coudl never talk with any one- finally after being on the phone for  10-15 minutes i left message on machine with new script and let them k now this is the third tim e- what is their problem.. talked with pt and let him know what trouble we are having- will mail him a script and let him mail it  Initial call taken by: Willy Eddy, LPN,  October 01, 2010 10:42 AM    Prescriptions: DOXYCYCLINE HYCLATE 100 MG CAPS (DOXYCYCLINE HYCLATE) one by mouth daily  #90 x 1   Entered by:   Willy Eddy, LPN   Authorized by:   Stacie Glaze MD   Signed by:   Willy Eddy, LPN on 57/84/6962   Method used:   Print then Give to Patient   RxID:   [id number]

## 2011-01-05 NOTE — Progress Notes (Signed)
Summary: psa results and urologist ov  Phone Note Call from Patient Call back at Home Phone [phone]   Caller: Patient Call For: Stacie Glaze MD Reason for Call: Insurance Question Summary of Call: pt has urologist appt on 05-07-2010. pt is resulting psa results and to find out should he kept urologist appt. Initial call taken by: Heron Sabins,  May 06, 2010 10:18 AM  Follow-up for Phone Call        psa did come down for 7 to 5 we can fax the result to the urologist or he can pick up a copy here  Follow-up by: Stacie Glaze MD,  May 06, 2010 11:16 AM  Additional Follow-up for Phone Call Additional follow up Details #1::        Pt wants to know why he has to see the URO if the PSA is going down.  He needs to be called early in the am, so he can cancel this appt or go to it.??? Please call ASAP Thursday am. Additional Follow-up by: Lynann Beaver CMA,  May 06, 2010 12:57 PM    Additional Follow-up for Phone Call Additional follow up Details #2::    due to the fact that it is still higher that bhis baseline of 4.5 just a check to be sure..... Follow-up by: Stacie Glaze MD,  May 07, 2010 8:04 AM  Additional Follow-up for Phone Call Additional follow up Details #3:: Details for Additional Follow-up Action Taken: pt informed and psa faxed to dr dahlstadt this am Additional Follow-up by: Willy Eddy, LPN,  May 07, 1477 9:10 AM

## 2011-01-05 NOTE — Assessment & Plan Note (Signed)
Summary: cpx/njr   Vital Signs:  Patient profile:   61 year old male Height:      70 inches Weight:      190 pounds BMI:     27.36 Temp:     98.2 degrees F oral Pulse rate:   76 / minute Resp:     14 per minute BP sitting:   136 / 90  (left arm)  Vitals Entered By: Willy Eddy, LPN (March 20, 2010 3:12 PM) CC: cpx   CC:  cpx.  Preventive Screening-Counseling & Management  Alcohol-Tobacco     Smoking Status: quit  Problems Prior to Update: 1)  Back Pain, Left  (ICD-724.5) 2)  Prostate Specific Antigen, Elevated  (ICD-790.93) 3)  Lateral Epicondylitis  (ICD-726.32) 4)  Actinic Keratosis, Forehead, Left  (ICD-702.0) 5)  Preventive Health Care  (ICD-V70.0) 6)  Wart, Left Hand  (ICD-078.10) 7)  Erectile Dysfunction, Mild  (ICD-302.72) 8)  Meniscus Tear  (ICD-836.2) 9)  Hernia, Bilateral Inguinal w/o Obst/gangrene  (ICD-550.92) 10)  Psa, Increased  (ICD-790.93) 11)  Hyperlipidemia  (ICD-272.4)  Medications Prior to Update: 1)  Multivitamins   Tabs (Multiple Vitamin) .... Once Daily 2)  Vitamin C 250 Mg  Chew (Ascorbic Acid) .... Once Daily 3)  Niacin Flush Free 500 Mg Caps (Inositol Niacinate) .... One By Mouth Daily 4)  Viagra 100 Mg Tabs (Sildenafil Citrate) .... Take As Directed One Hour Before Activity 5)  Hydrocodone-Acetaminophen 7.5-650 Mg Tabs (Hydrocodone-Acetaminophen) .... One By Mouth Q 6 Hour Prn 6)  Cyclobenzaprine Hcl 5 Mg Tabs (Cyclobenzaprine Hcl) .... One By Mouth Three Times A Day 7)  Moviprep 100 Gm  Solr (Peg-Kcl-Nacl-Nasulf-Na Asc-C) .... As Per Prep Instructions.  Current Medications (verified): 1)  Multivitamins   Tabs (Multiple Vitamin) .... Once Daily 2)  Vitamin C 250 Mg  Chew (Ascorbic Acid) .... Once Daily 3)  Niacin Flush Free 500 Mg Caps (Inositol Niacinate) .... One By Mouth Daily 4)  Viagra 100 Mg Tabs (Sildenafil Citrate) .... Take As Directed One Hour Before Activity 5)  Ciprofloxacin Hcl 500 Mg Tabs (Ciprofloxacin Hcl) .... One  By Mouth Two Times A Day For 21 Days  Allergies (verified): 1)  ! Betadine  Past History:  Social History: Last updated: 07/19/2007 Married Former Smoker  Risk Factors: Smoking Status: quit (03/20/2010)  Past medical, surgical, family and social histories (including risk factors) reviewed, and no changes noted (except as noted below).  Past Medical History: Reviewed history from 07/06/2007 and no changes required. Hyperlipidemia elevated psa 790.93  Past Surgical History: Reviewed history from 07/19/2007 and no changes required. Total hip replacement  Family History: Reviewed history and no changes required.  Social History: Reviewed history from 07/19/2007 and no changes required. Married Former Smoker  Review of Systems  The patient denies anorexia, fever, weight loss, weight gain, vision loss, decreased hearing, hoarseness, chest pain, syncope, dyspnea on exertion, peripheral edema, prolonged cough, headaches, hemoptysis, abdominal pain, melena, hematochezia, severe indigestion/heartburn, hematuria, incontinence, genital sores, muscle weakness, suspicious skin lesions, transient blindness, difficulty walking, depression, unusual weight change, abnormal bleeding, enlarged lymph nodes, angioedema, and breast masses.    Physical Exam  General:  Well-developed,well-nourished,in no acute distress; alert,appropriate and cooperative throughout examination Head:  male-pattern balding.   Eyes:  pupils equal and pupils round.   Ears:  R ear normal and L ear normal.   Nose:  no external deformity and no nasal discharge.   Mouth:  pharynx pink and moist and no erythema.  Neck:  No deformities, masses, or tenderness noted. Lungs:  normal respiratory effort and no wheezes.   Heart:  normal rate and regular rhythm.   Abdomen:  Bowel sounds positive,abdomen soft and non-tender without masses, organomegaly or hernias noted. Msk:  joint replacment Pulses:  R and L  carotid,radial,femoral,dorsalis pedis and posterior tibial pulses are full and equal bilaterally Extremities:  No clubbing, cyanosis, edema, or deformity noted with normal full range of motion of all joints.   Neurologic:  No cranial nerve deficits noted. Station and gait are normal. Plantar reflexes are down-going bilaterally. DTRs are symmetrical throughout. Sensory, motor and coordinative functions appear intact.   Impression & Recommendations:  Problem # 1:  PREVENTIVE HEALTH CARE (ICD-V70.0)  Colonoscopy: DONE (03/19/2010) Td Booster: Tdap (06/05/2006)   Chol: 142 (03/13/2010)   HDL: 45.00 (03/13/2010)   LDL: 89 (03/13/2010)   TG: 40.0 (03/13/2010) TSH: 1.60 (03/13/2010)   PSA: 7.49 (03/13/2010) Next Colonoscopy due:: 12/2009 (12/20/2008)  Discussed using sunscreen, use of alcohol, drug use, self testicular exam, routine dental care, routine eye care, routine physical exam, seat belts, multiple vitamins, osteoporosis prevention, adequate calcium intake in diet, and recommendations for immunizations.  Discussed exercise and checking cholesterol.  Discussed gun safety, safe sex, and contraception. Also recommend checking PSA.  Problem # 2:  PSA, INCREASED (ICD-790.93) monioter and it does not respond to the antibiotic then  reer ( hx of negsative bx in past 2008  Complete Medication List: 1)  Multivitamins Tabs (Multiple vitamin) .... Once daily 2)  Vitamin C 250 Mg Chew (Ascorbic acid) .... Once daily 3)  Niacin Flush Free 500 Mg Caps (Inositol niacinate) .... One by mouth daily 4)  Viagra 100 Mg Tabs (Sildenafil citrate) .... Take as directed one hour before activity 5)  Ciprofloxacin Hcl 500 Mg Tabs (Ciprofloxacin hcl) .... One by mouth two times a day for 21 days  Other Orders: Urology Referral (Urology)  Patient Instructions: 1)  PSA free and total  in 4 weeks 790.93 2)  Please schedule a follow-up appointment in 6 months. 3)  Lipid Panel prior to visit,  ICD-9:272.4 Prescriptions: CIPROFLOXACIN HCL 500 MG TABS (CIPROFLOXACIN HCL) one by mouth two times a day for 21 days  #42 x 0   Entered and Authorized by:   Stacie Glaze MD   Signed by:   Stacie Glaze MD on 03/20/2010   Method used:   Electronically to        [facility] [phone]* (retail)       [address]       [place], [place]  [address]       Ph: [phone]       Fax: [phone]   RxID:   [id number]

## 2011-01-07 NOTE — Progress Notes (Signed)
Summary: med call into pharm/ov declined  Phone Note Call from Patient Call back at Home Phone [phone]   Caller: Patient Call For: Stacie Glaze MD Summary of Call: pt has strain back would like hydrocodone  call into [facility]. Pt decline ov. Initial call taken by: Heron Sabins,  December 30, 2010 10:40 AM    New/Updated Medications: VICODIN 5-500 MG TABS (HYDROCODONE-ACETAMINOPHEN) 1 every 4 hours as needed pain Prescriptions: VICODIN 5-500 MG TABS (HYDROCODONE-ACETAMINOPHEN) 1 every 4 hours as needed pain  #30 x 1   Entered by:   Willy Eddy, LPN   Authorized by:   Stacie Glaze MD   Signed by:   Willy Eddy, LPN on 84/13/2440   Method used:   Telephoned to ...       [facility] [phone]* (retail)       [address]       [place], [place]  [address]       Ph: [phone]       Fax: [phone]   RxID:   [id number]  pt informed  left message on machine

## 2011-02-16 ENCOUNTER — Telehealth: Payer: Self-pay | Admitting: Internal Medicine

## 2011-02-16 NOTE — Telephone Encounter (Signed)
1 

## 2011-02-16 NOTE — Telephone Encounter (Signed)
Pt called and has questions re: upcoming labs and cpx. Pt is sch to come in. Pt is out of a job and does not have insurance.  Pt is req Dr Lovell Sheehan or nurse.

## 2011-02-16 NOTE — Telephone Encounter (Signed)
Talked with pt- lost job and insurance and will have to cx cpx and labs- he has 45 days remaining of doxycyline- do you want him to continue even though he is not coming for cpx

## 2011-02-17 NOTE — Telephone Encounter (Signed)
Finish the antibiotic

## 2011-02-17 NOTE — Telephone Encounter (Signed)
Pt informed

## 2011-02-26 ENCOUNTER — Other Ambulatory Visit: Payer: Self-pay

## 2011-03-05 ENCOUNTER — Encounter: Payer: Self-pay | Admitting: Internal Medicine

## 2011-04-20 NOTE — Op Note (Signed)
Ruben Chavez, Ruben Chavez             ACCOUNT NO.:  [id number]   MEDICAL RECORD NO.:  [id number]          PATIENT TYPE:  AMB   LOCATION:  [facility]                         FACILITY:  [facility]   PHYSICIAN:  Ollen Gross, M.D.    DATE OF BIRTH:  1951/11/11   DATE OF PROCEDURE:  09/27/2007  DATE OF DISCHARGE:                               OPERATIVE REPORT   PREOPERATIVE DIAGNOSIS:  Right knee medial meniscal tear.   POSTOPERATIVE DIAGNOSIS:  1. Right knee medial meniscal tear.  2. Chondral defect medial femoral condyle.   PROCEDURE:  1. Right knee arthroscopy.  2. Meniscal debridement.  3. Chondroplasty.   SURGEON:  Ollen Gross, M.D.   ASSISTANT:  None.   ANESTHESIA:  General.   ESTIMATED BLOOD LOSS:  Minimal.   DRAINS:  None.   COMPLICATIONS:  None.   CLINICAL NOTE:  The 60 year old male who has almost a year history of  significant right knee pain, mechanical symptoms which have gotten  worse.  He recently had MRI which showed a medial meniscal tear.  He  presents now for arthroscopy and debridement.   PROCEDURE IN DETAIL:  After successful administration of general  anesthetic, a tourniquet was placed high on the right thigh and right  lower extremity prepped and draped in usual sterile fashion.  Standard  superomedial inferolateral incisions were made in flow cannula passed  superomedial camera passed inferolateral.  Arthroscopic visualization  proceeds.  The undersurface of the patella and trochlea looked normal.  Mediolateral gutters were visualized.  There are no loose bodies.  Flexion valgus force applied to the knee and the medial compartment  entered.  There is evidence of a small chondral delamination off the  medial femoral condyles as well as significant tear in the body and  posterior horn of the medial meniscus.  A spinal needle is used to  localize the inferomedial portal, small incision made, dilator placed  and a probe placed.  The meniscus is unstable.  This  is debrided back to  stable base with baskets and 4.2 mm shaver and sealed off with the  ArthroCare device.  The chondral defect is debrided back to a stable  cartilaginous base with the 4.2 mm shaver.  Did not go down to  subchondral bone.  The intercondylar notch is visualized and the ACL  looks normal.  Lateral compartment is entered.  It is normal.  I again  inspected the joint.  There were no further cartilage tears, chondral  defects or loose bodies.  The arthroscopic  equipment is then removed from the inferior portals which were closed  with interrupted 4-0 nylon and 20 mL of 0.25% Marcaine with epinephrine  injected through the inflow cannula and then that is removed and that  portal closed with nylon.  Bulky sterile dressing applied and he is  awakened and transferred to recovery in stable condition.      Ollen Gross, M.D.  Electronically Signed     FA/MEDQ  D:  09/27/2007  T:  09/28/2007  Job:  [id number]

## 2011-06-18 ENCOUNTER — Other Ambulatory Visit (INDEPENDENT_AMBULATORY_CARE_PROVIDER_SITE_OTHER): Payer: BC Managed Care – PPO

## 2011-06-18 DIAGNOSIS — Z0000 Encounter for general adult medical examination without abnormal findings: Secondary | ICD-10-CM

## 2011-06-18 LAB — CBC WITH DIFFERENTIAL/PLATELET
Basophils Absolute: 0 10*3/uL (ref 0.0–0.1)
Eosinophils Absolute: 0.1 10*3/uL (ref 0.0–0.7)
Hemoglobin: 15 g/dL (ref 13.0–17.0)
Lymphocytes Relative: 18.4 % (ref 12.0–46.0)
Lymphs Abs: 1.2 10*3/uL (ref 0.7–4.0)
MCHC: 34.6 g/dL (ref 30.0–36.0)
Monocytes Relative: 8.6 % (ref 3.0–12.0)
Neutro Abs: 4.4 10*3/uL (ref 1.4–7.7)
Platelets: 205 10*3/uL (ref 150.0–400.0)
RDW: 14.4 % (ref 11.5–14.6)

## 2011-06-18 LAB — BASIC METABOLIC PANEL
Calcium: 9.6 mg/dL (ref 8.4–10.5)
Chloride: 105 mEq/L (ref 96–112)
Creatinine, Ser: 0.8 mg/dL (ref 0.4–1.5)
Sodium: 142 mEq/L (ref 135–145)

## 2011-06-18 LAB — POCT URINALYSIS DIPSTICK
Blood, UA: NEGATIVE
Nitrite, UA: NEGATIVE
Protein, UA: NEGATIVE
Urobilinogen, UA: 0.2
pH, UA: 5.5

## 2011-06-18 LAB — LIPID PANEL
HDL: 48.8 mg/dL (ref >39.00)
LDL Cholesterol: 98 mg/dL (ref 0–99)
Total CHOL/HDL Ratio: 3
Triglycerides: 53 mg/dL (ref 0.0–149.0)

## 2011-06-18 LAB — HEPATIC FUNCTION PANEL
Alkaline Phosphatase: 45 U/L (ref 39–117)
Bilirubin, Direct: 0.1 mg/dL (ref 0.0–0.3)
Total Bilirubin: 0.6 mg/dL (ref 0.3–1.2)

## 2011-06-23 ENCOUNTER — Encounter: Payer: Self-pay | Admitting: Internal Medicine

## 2011-06-25 ENCOUNTER — Ambulatory Visit (INDEPENDENT_AMBULATORY_CARE_PROVIDER_SITE_OTHER): Payer: BC Managed Care – PPO | Admitting: Internal Medicine

## 2011-06-25 ENCOUNTER — Encounter: Payer: Self-pay | Admitting: Internal Medicine

## 2011-06-25 VITALS — BP 150/80 | HR 58 | Temp 98.4°F | Resp 16 | Ht 70.0 in | Wt 192.0 lb

## 2011-06-25 DIAGNOSIS — Z0000 Encounter for general adult medical examination without abnormal findings: Secondary | ICD-10-CM

## 2011-06-25 DIAGNOSIS — L570 Actinic keratosis: Secondary | ICD-10-CM

## 2011-06-25 DIAGNOSIS — E785 Hyperlipidemia, unspecified: Secondary | ICD-10-CM

## 2011-06-25 DIAGNOSIS — R972 Elevated prostate specific antigen [PSA]: Secondary | ICD-10-CM

## 2011-06-25 DIAGNOSIS — M549 Dorsalgia, unspecified: Secondary | ICD-10-CM

## 2011-06-25 NOTE — Progress Notes (Signed)
  Subjective:    Patient ID: Ruben Chavez, male    DOB: 07/09/1951, 60 y.o.   MRN: [id number]  HPI Presents for CPX Recent history of elevated PSA one year ago 27 we have been following serial PSAs every 3 months since that time and have treated the acute prostatitis with antibiotics his PSA dropped to 6 and 5 and today it is in the high fours.  Declining velocity of the PSA is reassuring that this represents prostate inflammation and BPH rather than a malignant process.  He continues with mild rectal dysfunction is a history of bilateral hernias has a history of degenerative joint disease status post joint replacement   Review of Systems  Constitutional: Negative for fever and fatigue.  HENT: Negative for hearing loss, congestion, neck pain and postnasal drip.   Eyes: Negative for discharge, redness and visual disturbance.  Respiratory: Negative for cough, shortness of breath and wheezing.   Cardiovascular: Negative for leg swelling.  Gastrointestinal: Negative for abdominal pain, constipation and abdominal distention.  Genitourinary: Negative for urgency and frequency.  Musculoskeletal: Negative for joint swelling and arthralgias.  Skin: Negative for color change and rash.  Neurological: Negative for weakness and light-headedness.  Hematological: Negative for adenopathy.  Psychiatric/Behavioral: Negative for behavioral problems.   Past Medical History  Diagnosis Date  . Hyperlipidemia   . Elevated PSA    Past Surgical History  Procedure Date  . Total hip arthroplasty     reports that he has quit smoking. He does not have any smokeless tobacco history on file. He reports that he does not drink alcohol or use illicit drugs. family history is not on file. Allergies  Allergen Reactions  . Povidone-Iodine     REACTION: rash       Objective:   Physical Exam  Nursing note and vitals reviewed. Constitutional: He is oriented to person, place, and time. He appears  well-developed and well-nourished.  HENT:  Head: Normocephalic and atraumatic.  Eyes: Conjunctivae are normal. Pupils are equal, round, and reactive to light.  Neck: Normal range of motion. Neck supple.  Cardiovascular: Normal rate and regular rhythm.   Pulmonary/Chest: Effort normal and breath sounds normal.  Abdominal: Soft. Bowel sounds are normal.  Genitourinary: Guaiac negative stool. No penile tenderness.       Prostate +2 enlarged  Musculoskeletal: He exhibits tenderness.  Neurological: He is alert and oriented to person, place, and time. He has normal reflexes. No cranial nerve deficit. He exhibits normal muscle tone. Coordination normal.  Skin: Skin is warm and dry.  Psychiatric: He has a normal mood and affect. His behavior is normal. Judgment and thought content normal.          Assessment & Plan:   Patient presents for yearly preventative medicine examination.   all immunizations and health maintenance protocols were reviewed with the patient and they are up to date with these protocols.   screening laboratory values were reviewed with the patient including screening of hyperlipidemia PSA renal function and hepatic function.   There medications past medical history social history problem list and allergies were reviewed in detail.   Goals were established with regard to weight loss exercise diet in compliance with medications  The PSA velocity now is negative we'll continue to monitor this we plan return this visit in 6 months.  Blood pressure is stable hyperlipidemia is treated

## 2011-09-15 LAB — POCT HEMOGLOBIN-HEMACUE: Hemoglobin: 16.2

## 2011-12-07 DIAGNOSIS — C61 Malignant neoplasm of prostate: Secondary | ICD-10-CM

## 2011-12-07 HISTORY — DX: Malignant neoplasm of prostate: C61

## 2011-12-07 HISTORY — PX: PROSTATE BIOPSY: SHX241

## 2012-01-21 ENCOUNTER — Other Ambulatory Visit: Payer: Self-pay | Admitting: *Deleted

## 2012-01-21 ENCOUNTER — Other Ambulatory Visit (INDEPENDENT_AMBULATORY_CARE_PROVIDER_SITE_OTHER): Payer: BC Managed Care – PPO

## 2012-01-21 DIAGNOSIS — R972 Elevated prostate specific antigen [PSA]: Secondary | ICD-10-CM

## 2012-01-21 MED ORDER — CYCLOBENZAPRINE HCL 10 MG PO TABS: 10.0000 mg | ORAL_TABLET | Freq: Three times a day (TID) | ORAL | Status: AC | PRN

## 2012-01-21 NOTE — Telephone Encounter (Deleted)
Requesting muscle relaxer for back pain-

## 2012-01-21 NOTE — Telephone Encounter (Signed)
Per dr Lovell Sheehan m ay have flexeril 10 tid for 10 days with 1 refill

## 2012-01-21 NOTE — Telephone Encounter (Deleted)
Sent to pharmacy 

## 2012-01-24 LAB — PSA, TOTAL AND FREE: PSA: 7.72 ng/mL — ABNORMAL HIGH (ref <=4.00)

## 2012-01-27 NOTE — Progress Notes (Signed)
Has ov tomorrow - dr Lovell Sheehan will discuss then

## 2012-01-28 ENCOUNTER — Encounter: Payer: Self-pay | Admitting: Internal Medicine

## 2012-01-28 ENCOUNTER — Ambulatory Visit (INDEPENDENT_AMBULATORY_CARE_PROVIDER_SITE_OTHER): Payer: BC Managed Care – PPO | Admitting: Internal Medicine

## 2012-01-28 VITALS — BP 170/90 | HR 80 | Temp 98.2°F | Resp 16 | Ht 71.5 in | Wt 198.0 lb

## 2012-01-28 DIAGNOSIS — N411 Chronic prostatitis: Secondary | ICD-10-CM

## 2012-01-28 MED ORDER — CIPROFLOXACIN HCL 500 MG PO TABS: 500.0000 mg | ORAL_TABLET | Freq: Two times a day (BID) | ORAL | Status: AC

## 2012-01-28 NOTE — Patient Instructions (Signed)
The patient is instructed to continue all medications as prescribed. Schedule followup with check out clerk upon leaving the clinic  

## 2012-01-28 NOTE — Progress Notes (Signed)
Subjective:    Patient ID: Ruben Chavez, male    DOB: 03-Apr-1951, 61 y.o.   MRN: [id number]  HPI His is a 61 year old male patient is followed for elevated PSA.  His PSA has been elevated intermittently for over 3 years in 2008 he had a biopsy for an elevation in his PSA by Dr. Letha Cape. At that time the biopsies were negative.  His PSA went to 5.3 in 2010 then dropped to 4.69 then increased to 7.49 in 2011 and dropped to 5.47 subsequent measurements 5.64 and subsequent measure was 4.93 on 06/18/2011 6 month monitoring shows an elevation to 7.72.  In the past she's been treated with antibiotics without resolution of the elevation. We will again treat him with antibiotics if the PSA returns to the 5 range then we will continue watchful waiting if the PSA continues to be elevated in the 7+ range we will refer him back to the urologist for consideration for second biopsy.     Review of Systems  Constitutional: Negative for fever and fatigue.  HENT: Negative for hearing loss, congestion, neck pain and postnasal drip.   Eyes: Negative for discharge, redness and visual disturbance.  Respiratory: Negative for cough, shortness of breath and wheezing.   Cardiovascular: Negative for leg swelling.  Gastrointestinal: Negative for abdominal pain, constipation and abdominal distention.  Genitourinary: Negative for urgency and frequency.  Musculoskeletal: Negative for joint swelling and arthralgias.  Skin: Negative for color change and rash.  Neurological: Negative for weakness and light-headedness.  Hematological: Negative for adenopathy.  Psychiatric/Behavioral: Negative for behavioral problems.   Past Medical History  Diagnosis Date  . Hyperlipidemia   . Elevated PSA     History   Social History  . Marital Status: Married    Spouse Name: N/A    Number of Children: N/A  . Years of Education: N/A   Occupational History  . Not on file.   Social History Main Topics  . Smoking  status: Former Games developer  . Smokeless tobacco: Not on file  . Alcohol Use: No  . Drug Use: No  . Sexually Active: Yes   Other Topics Concern  . Not on file   Social History Narrative  . No narrative on file    Past Surgical History  Procedure Date  . Total hip arthroplasty     No family history on file.  Allergies  Allergen Reactions  . Povidone-Iodine     REACTION: rash    Current Outpatient Prescriptions on File Prior to Visit  Medication Sig Dispense Refill  . cyclobenzaprine (FLEXERIL) 10 MG tablet Take 1 tablet (10 mg total) by mouth 3 (three) times daily as needed for muscle spasms.  30 tablet  1  . HYDROcodone-acetaminophen (VICODIN) 5-500 MG per tablet Take 1 tablet by mouth every 4 (four) hours as needed.        . Inositol Niacinate (NIACIN FLUSH FREE) 500 MG CAPS Take by mouth daily.        . multivitamin (THERAGRAN) per tablet Take 1 tablet by mouth daily.        . sildenafil (VIAGRA) 100 MG tablet Take 100 mg by mouth daily as needed.        . vitamin C (ASCORBIC ACID) 250 MG tablet Take 250 mg by mouth daily.          BP 180/100  Pulse 80  Temp 98.2 F (36.8 C)  Resp 16  Ht 5' 11.5" (1.816 m)  Wt 198 lb (89.812 kg)  BMI 27.23 kg/m2       Objective:   Physical Exam  Nursing note and vitals reviewed. Constitutional: He appears well-developed and well-nourished.  HENT:  Head: Normocephalic and atraumatic.  Eyes: Conjunctivae are normal. Pupils are equal, round, and reactive to light.  Neck: Normal range of motion. Neck supple.  Cardiovascular: Normal rate and regular rhythm.   Pulmonary/Chest: Effort normal and breath sounds normal.  Abdominal: Soft. Bowel sounds are normal.          Assessment & Plan:  The plan was discussed with the patient and the patient agreed.

## 2012-02-03 ENCOUNTER — Telehealth: Payer: Self-pay | Admitting: Internal Medicine

## 2012-02-03 NOTE — Telephone Encounter (Signed)
Pt was seen on 01-28-2012 for prostate inf .Pt was given abx rx for 21 day and then 3 month appt  follow on psa. Pt would like to know can he change appt to mid April due to change in Insurance and ?biopsy maybe need. Pt insurance will change in May.

## 2012-02-03 NOTE — Telephone Encounter (Signed)
That will be fine in april

## 2012-02-04 NOTE — Telephone Encounter (Signed)
Pt is sch for all appt in april

## 2012-03-28 ENCOUNTER — Other Ambulatory Visit (INDEPENDENT_AMBULATORY_CARE_PROVIDER_SITE_OTHER): Payer: BC Managed Care – PPO

## 2012-03-28 DIAGNOSIS — R972 Elevated prostate specific antigen [PSA]: Secondary | ICD-10-CM

## 2012-04-03 ENCOUNTER — Ambulatory Visit: Payer: BC Managed Care – PPO | Admitting: Internal Medicine

## 2012-04-07 ENCOUNTER — Encounter: Payer: Self-pay | Admitting: Internal Medicine

## 2012-04-07 ENCOUNTER — Ambulatory Visit (INDEPENDENT_AMBULATORY_CARE_PROVIDER_SITE_OTHER): Payer: BC Managed Care – PPO | Admitting: Internal Medicine

## 2012-04-07 VITALS — BP 154/84 | HR 76 | Temp 98.3°F | Resp 16 | Ht 71.0 in | Wt 192.0 lb

## 2012-04-07 DIAGNOSIS — R972 Elevated prostate specific antigen [PSA]: Secondary | ICD-10-CM

## 2012-04-07 NOTE — Progress Notes (Signed)
  Subjective:    Patient ID: Ruben Chavez, male    DOB: Nov 21, 1951, 61 y.o.   MRN: [id number]  HPI Reason is a 61 year old male that we follow for elevated PSA.  His first elevation of his PSA was about 5 years ago when he was referred to urology and had stereotypical biopsies. Those were negative his PSA has hovered in the 5 range over the last several years with one bump up to the 7 range which return to the 5 range with antibiotic therapy.  At his last physical PSA increased again to the mid sevens and he took an antibiotic course and although it did not continue to rise it did not declined significantly.  We have discussed all the options available and we think a consultation with the urologist is the most viable at this time 2 courses may be pursued one with the continued watchful waiting under the guidance of the urologist the other would be to go ahead with a second series of biopsies.  Patient understands these options and we'll proceed with a consult as soon as possible   Review of Systems  Constitutional: Negative for fever and fatigue.  HENT: Negative for hearing loss, congestion, neck pain and postnasal drip.   Eyes: Negative for discharge, redness and visual disturbance.  Respiratory: Negative for cough, shortness of breath and wheezing.   Cardiovascular: Negative for leg swelling.  Gastrointestinal: Negative for abdominal pain, constipation and abdominal distention.  Genitourinary: Negative for urgency and frequency.  Musculoskeletal: Negative for joint swelling and arthralgias.  Skin: Negative for color change and rash.  Neurological: Negative for weakness and light-headedness.  Hematological: Negative for adenopathy.  Psychiatric/Behavioral: Negative for behavioral problems.       Objective:   Physical Exam  Nursing note and vitals reviewed. Constitutional: He appears well-developed and well-nourished.  HENT:  Head: Normocephalic and atraumatic.  Eyes:  Conjunctivae are normal. Pupils are equal, round, and reactive to light.  Neck: Normal range of motion. Neck supple.  Cardiovascular: Normal rate and regular rhythm.   Pulmonary/Chest: Effort normal and breath sounds normal.  Abdominal: Soft. Bowel sounds are normal.          Assessment & Plan:  Referred to Dr. Cyndie Mull at urology.

## 2012-04-07 NOTE — Patient Instructions (Signed)
The patient is instructed to continue all medications as prescribed. Schedule followup with check out clerk upon leaving the clinic  

## 2012-04-11 ENCOUNTER — Telehealth: Payer: Self-pay | Admitting: Internal Medicine

## 2012-04-11 NOTE — Telephone Encounter (Signed)
Open in error

## 2012-04-14 ENCOUNTER — Other Ambulatory Visit: Payer: BC Managed Care – PPO

## 2012-04-28 ENCOUNTER — Ambulatory Visit: Payer: BC Managed Care – PPO | Admitting: Internal Medicine

## 2012-06-19 ENCOUNTER — Telehealth: Payer: Self-pay | Admitting: Internal Medicine

## 2012-06-19 NOTE — Telephone Encounter (Signed)
Pt informed he will be charged for an office visit and he decided to wait until cpx

## 2012-06-19 NOTE — Telephone Encounter (Signed)
Pt is coming in for labs on Wed and has wax build up in his ear he needs to have removed pt want to know if he can have it done then or if he will need to wait for his cpx appt. Offered pt to see another provider, pt declined. Please contact

## 2012-06-21 ENCOUNTER — Other Ambulatory Visit (INDEPENDENT_AMBULATORY_CARE_PROVIDER_SITE_OTHER): Payer: BC Managed Care – PPO

## 2012-06-21 DIAGNOSIS — Z0000 Encounter for general adult medical examination without abnormal findings: Secondary | ICD-10-CM

## 2012-06-21 LAB — CBC WITH DIFFERENTIAL/PLATELET
Basophils Absolute: 0 10*3/uL (ref 0.0–0.1)
Eosinophils Relative: 2.3 % (ref 0.0–5.0)
HCT: 41.3 % (ref 39.0–52.0)
Hemoglobin: 13.8 g/dL (ref 13.0–17.0)
MCHC: 33.5 g/dL (ref 30.0–36.0)
Monocytes Absolute: 0.6 10*3/uL (ref 0.1–1.0)
Monocytes Relative: 9.6 % (ref 3.0–12.0)
Neutrophils Relative %: 70.2 % (ref 43.0–77.0)
Platelets: 251 10*3/uL (ref 150.0–400.0)
RBC: 4.44 Mil/uL (ref 4.22–5.81)
RDW: 14.5 % (ref 11.5–14.6)

## 2012-06-21 LAB — HEPATIC FUNCTION PANEL
ALT: 26 U/L (ref 0–53)
Bilirubin, Direct: 0 mg/dL (ref 0.0–0.3)
Total Bilirubin: 0.4 mg/dL (ref 0.3–1.2)

## 2012-06-21 LAB — BASIC METABOLIC PANEL
CO2: 27 mEq/L (ref 19–32)
Calcium: 9.1 mg/dL (ref 8.4–10.5)
Chloride: 107 mEq/L (ref 96–112)
Potassium: 4.1 mEq/L (ref 3.5–5.1)
Sodium: 142 mEq/L (ref 135–145)

## 2012-06-21 LAB — LIPID PANEL
Cholesterol: 134 mg/dL (ref 0–200)
LDL Cholesterol: 79 mg/dL (ref 0–99)
Total CHOL/HDL Ratio: 3
Triglycerides: 81 mg/dL (ref 0.0–149.0)
VLDL: 16.2 mg/dL (ref 0.0–40.0)

## 2012-06-21 LAB — POCT URINALYSIS DIPSTICK
Glucose, UA: NEGATIVE
Nitrite, UA: NEGATIVE
Protein, UA: NEGATIVE
Spec Grav, UA: 1.025
Urobilinogen, UA: 0.2
pH, UA: 6

## 2012-06-22 LAB — PSA: PSA: 9.91 ng/mL — ABNORMAL HIGH (ref 0.10–4.00)

## 2012-06-28 ENCOUNTER — Encounter: Payer: Self-pay | Admitting: Internal Medicine

## 2012-06-28 ENCOUNTER — Encounter: Payer: BC Managed Care – PPO | Admitting: Internal Medicine

## 2012-06-28 ENCOUNTER — Ambulatory Visit (INDEPENDENT_AMBULATORY_CARE_PROVIDER_SITE_OTHER): Payer: BC Managed Care – PPO | Admitting: Internal Medicine

## 2012-06-28 VITALS — BP 150/90 | HR 72 | Temp 98.1°F | Resp 16 | Ht 71.5 in | Wt 192.0 lb

## 2012-06-28 DIAGNOSIS — N411 Chronic prostatitis: Secondary | ICD-10-CM

## 2012-06-28 DIAGNOSIS — R972 Elevated prostate specific antigen [PSA]: Secondary | ICD-10-CM

## 2012-06-28 DIAGNOSIS — H6120 Impacted cerumen, unspecified ear: Secondary | ICD-10-CM

## 2012-06-28 DIAGNOSIS — Z2911 Encounter for prophylactic immunotherapy for respiratory syncytial virus (RSV): Secondary | ICD-10-CM

## 2012-06-28 DIAGNOSIS — Z0000 Encounter for general adult medical examination without abnormal findings: Secondary | ICD-10-CM

## 2012-06-28 MED ORDER — SULFAMETHOXAZOLE-TRIMETHOPRIM 800-160 MG PO TABS: 1.0000 | ORAL_TABLET | Freq: Two times a day (BID) | ORAL | Status: AC

## 2012-06-28 NOTE — Addendum Note (Signed)
Addended by: Willy Eddy on: 06/28/2012 04:43 PM   Modules accepted: Orders

## 2012-06-28 NOTE — Patient Instructions (Addendum)
The patient is instructed to continue all medications as prescribed. Schedule followup with check out clerk upon leaving the clinic  

## 2012-06-28 NOTE — Progress Notes (Signed)
Subjective:    Patient ID: Ruben Chavez, male    DOB: 1951-10-25, 61 y.o.   MRN: [id number]  HPI 90 % hearing loss in the right ear prostate biopsy Low grade prostate cancer Seeing the urologist Elected to do the 6 month "watchfull waiting"  Here for CPX     Review of Systems  Constitutional: Negative for fever and fatigue.  HENT: Negative for hearing loss, congestion, neck pain and postnasal drip.   Eyes: Negative for discharge, redness and visual disturbance.  Respiratory: Negative for cough, shortness of breath and wheezing.   Cardiovascular: Negative for leg swelling.  Gastrointestinal: Negative for abdominal pain, constipation and abdominal distention.  Genitourinary: Negative for urgency and frequency.  Musculoskeletal: Negative for joint swelling and arthralgias.  Skin: Negative for color change and rash.  Neurological: Negative for weakness and light-headedness.  Hematological: Negative for adenopathy.  Psychiatric/Behavioral: Negative for behavioral problems.   Past Medical History  Diagnosis Date  . Hyperlipidemia   . Elevated PSA     History   Social History  . Marital Status: Married    Spouse Name: N/A    Number of Children: N/A  . Years of Education: N/A   Occupational History  . Not on file.   Social History Main Topics  . Smoking status: Former Games developer  . Smokeless tobacco: Not on file  . Alcohol Use: No  . Drug Use: No  . Sexually Active: Yes   Other Topics Concern  . Not on file   Social History Narrative  . No narrative on file    Past Surgical History  Procedure Date  . Total hip arthroplasty     No family history on file.  Allergies  Allergen Reactions  . Povidone     REACTION: rash    Current Outpatient Prescriptions on File Prior to Visit  Medication Sig Dispense Refill  . HYDROcodone-acetaminophen (VICODIN) 5-500 MG per tablet Take 1 tablet by mouth every 4 (four) hours as needed.        . Inositol Niacinate  (NIACIN FLUSH FREE) 500 MG CAPS Take by mouth daily.        . multivitamin (THERAGRAN) per tablet Take 1 tablet by mouth daily.        . saw palmetto 160 MG capsule Take 160 mg by mouth daily.      . sildenafil (VIAGRA) 100 MG tablet Take 100 mg by mouth daily as needed.        . vitamin C (ASCORBIC ACID) 250 MG tablet Take 250 mg by mouth daily.          BP 150/90  Pulse 72  Temp 98.1 F (36.7 C)  Resp 16  Ht 5' 11.5" (1.816 m)  Wt 192 lb (87.091 kg)  BMI 26.41 kg/m2       Objective:   Physical Exam  Nursing note and vitals reviewed. Constitutional: He appears well-developed and well-nourished.  HENT:  Head: Normocephalic and atraumatic.  Eyes: Conjunctivae are normal. Pupils are equal, round, and reactive to light.  Neck: Normal range of motion. Neck supple.  Cardiovascular: Normal rate and regular rhythm.   Pulmonary/Chest: Effort normal and breath sounds normal.  Abdominal: Soft. Bowel sounds are normal.  Musculoskeletal: Normal range of motion.  Neurological: He is alert.  Skin: Skin is warm and dry.  Psychiatric: He has a normal mood and affect. His behavior is normal.          Assessment & Plan:   Patient presents for  yearly preventative medicine examination.   all immunizations and health maintenance protocols were reviewed with the patient and they are up to date with these protocols.   screening laboratory values were reviewed with the patient including screening of hyperlipidemia PSA renal function and hepatic function.   There medications past medical history social history problem list and allergies were reviewed in detail.  Elevated PAS and positive biopsy with low level malignancy  Dicussed patients choice for watchfully waiting  I have spent more than 30 minutes examining this patient face-to-face of which over half was spent in counseling    Goals were established with regard to weight loss exercise diet in compliance with  medications  Physical examination is consistent with mild chronic prostatitis will treat with Septra DS for 21 days

## 2012-07-17 ENCOUNTER — Encounter: Payer: BC Managed Care – PPO | Admitting: Internal Medicine

## 2012-08-29 ENCOUNTER — Other Ambulatory Visit (INDEPENDENT_AMBULATORY_CARE_PROVIDER_SITE_OTHER): Payer: BC Managed Care – PPO

## 2012-08-29 DIAGNOSIS — R972 Elevated prostate specific antigen [PSA]: Secondary | ICD-10-CM

## 2012-08-29 NOTE — Progress Notes (Signed)
Pt informed-very pleased

## 2013-03-14 ENCOUNTER — Encounter: Payer: Self-pay | Admitting: Gastroenterology

## 2013-04-02 ENCOUNTER — Encounter: Payer: Self-pay | Admitting: Gastroenterology

## 2013-05-10 ENCOUNTER — Encounter: Payer: BC Managed Care – PPO | Admitting: Gastroenterology

## 2013-06-05 ENCOUNTER — Ambulatory Visit (AMBULATORY_SURGERY_CENTER): Payer: BC Managed Care – PPO | Admitting: *Deleted

## 2013-06-05 VITALS — Ht 72.0 in | Wt 193.6 lb

## 2013-06-05 DIAGNOSIS — Z86010 Personal history of colonic polyps: Secondary | ICD-10-CM

## 2013-06-05 MED ORDER — MOVIPREP 100 G PO SOLR: ORAL | Status: DC

## 2013-06-06 ENCOUNTER — Encounter: Payer: Self-pay | Admitting: Gastroenterology

## 2013-06-29 ENCOUNTER — Encounter: Payer: BC Managed Care – PPO | Admitting: Gastroenterology

## 2013-07-12 ENCOUNTER — Telehealth: Payer: Self-pay | Admitting: Internal Medicine

## 2013-07-12 NOTE — Telephone Encounter (Signed)
We just dont have anything- we dont have places to put people in that we are moving that has scheduled-maybe padonda could do, if ok with pt

## 2013-07-12 NOTE — Telephone Encounter (Signed)
Pt would rather wait until jan for Dr Lovell Sheehan.  appt scheduled.

## 2013-07-12 NOTE — Telephone Encounter (Signed)
lmovm making pt aware / ga

## 2013-07-12 NOTE — Telephone Encounter (Signed)
PT is requesting to be seen by Dr. Lovell Sheehan soon for a CPX. Please assist.

## 2013-09-11 ENCOUNTER — Other Ambulatory Visit (INDEPENDENT_AMBULATORY_CARE_PROVIDER_SITE_OTHER): Payer: BC Managed Care – PPO

## 2013-09-11 DIAGNOSIS — Z0000 Encounter for general adult medical examination without abnormal findings: Secondary | ICD-10-CM

## 2013-09-11 LAB — LIPID PANEL
Cholesterol: 144 mg/dL (ref 0–200)
HDL: 47.1 mg/dL (ref >39.00)
LDL Cholesterol: 81 mg/dL (ref 0–99)
Triglycerides: 81 mg/dL (ref 0.0–149.0)

## 2013-09-11 LAB — BASIC METABOLIC PANEL
BUN: 17 mg/dL (ref 6–23)
CO2: 29 mEq/L (ref 19–32)
Calcium: 9.6 mg/dL (ref 8.4–10.5)
GFR: 108.22 mL/min (ref >60.00)
Glucose, Bld: 87 mg/dL (ref 70–99)
Potassium: 5.5 mEq/L — ABNORMAL HIGH (ref 3.5–5.1)
Sodium: 144 mEq/L (ref 135–145)

## 2013-09-11 LAB — HEPATIC FUNCTION PANEL
ALT: 35 U/L (ref 0–53)
AST: 36 U/L (ref 0–37)
Alkaline Phosphatase: 45 U/L (ref 39–117)
Bilirubin, Direct: 0.1 mg/dL (ref 0.0–0.3)
Total Bilirubin: 0.9 mg/dL (ref 0.3–1.2)
Total Protein: 7 g/dL (ref 6.0–8.3)

## 2013-09-11 LAB — CBC WITH DIFFERENTIAL/PLATELET
Basophils Absolute: 0 10*3/uL (ref 0.0–0.1)
Eosinophils Absolute: 0.2 10*3/uL (ref 0.0–0.7)
HCT: 44 % (ref 39.0–52.0)
Hemoglobin: 15 g/dL (ref 13.0–17.0)
Lymphs Abs: 1 10*3/uL (ref 0.7–4.0)
MCHC: 34 g/dL (ref 30.0–36.0)
Neutro Abs: 5.4 10*3/uL (ref 1.4–7.7)
Platelets: 226 10*3/uL (ref 150.0–400.0)
RDW: 14.9 % — ABNORMAL HIGH (ref 11.5–14.6)

## 2013-09-11 LAB — PSA: PSA: 7.82 ng/mL — ABNORMAL HIGH (ref 0.10–4.00)

## 2013-09-11 LAB — POCT URINALYSIS DIPSTICK
Bilirubin, UA: NEGATIVE
Glucose, UA: NEGATIVE
Nitrite, UA: NEGATIVE
Spec Grav, UA: 1.025
Urobilinogen, UA: 0.2

## 2013-09-11 LAB — TSH: TSH: 2.16 u[IU]/mL (ref 0.35–5.50)

## 2013-09-17 ENCOUNTER — Encounter: Payer: Self-pay | Admitting: Internal Medicine

## 2013-09-17 ENCOUNTER — Ambulatory Visit (INDEPENDENT_AMBULATORY_CARE_PROVIDER_SITE_OTHER): Payer: BC Managed Care – PPO | Admitting: Internal Medicine

## 2013-09-17 VITALS — BP 156/90 | HR 72 | Temp 98.2°F | Resp 16 | Ht 72.0 in | Wt 194.0 lb

## 2013-09-17 DIAGNOSIS — Z0000 Encounter for general adult medical examination without abnormal findings: Secondary | ICD-10-CM

## 2013-09-17 DIAGNOSIS — Z23 Encounter for immunization: Secondary | ICD-10-CM

## 2013-09-17 MED ORDER — NAPROXEN 375 MG PO TABS: 375.0000 mg | ORAL_TABLET | Freq: Two times a day (BID) | ORAL | Status: DC

## 2013-09-17 NOTE — Patient Instructions (Addendum)
Stop the niacin Start kril oil

## 2013-09-17 NOTE — Progress Notes (Signed)
Subjective:    Patient ID: Ruben Chavez, male    DOB: 1951-10-19, 62 y.o.   MRN: [id number]  HPI Has follow up wit [facility] for PSA Had biopsy with low grade CPX Lipid monitoring     Review of Systems  Constitutional: Negative for fever and fatigue.  HENT: Negative for congestion, hearing loss and postnasal drip.   Eyes: Negative for discharge, redness and visual disturbance.  Respiratory: Negative for cough, shortness of breath and wheezing.   Cardiovascular: Negative for leg swelling.  Gastrointestinal: Negative for abdominal pain, constipation and abdominal distention.  Genitourinary: Negative for urgency and frequency.  Musculoskeletal: Negative for arthralgias, joint swelling and neck pain.  Skin: Negative for color change and rash.  Neurological: Negative for weakness and light-headedness.  Hematological: Negative for adenopathy.  Psychiatric/Behavioral: Negative for behavioral problems.       Past Medical History  Diagnosis Date  . Hyperlipidemia   . Elevated PSA   . Prostate cancer 2013    History   Social History  . Marital Status: Married    Spouse Name: N/A    Number of Children: N/A  . Years of Education: N/A   Occupational History  . Not on file.   Social History Main Topics  . Smoking status: Former Smoker    Quit date: 06/06/1999  . Smokeless tobacco: Never Used  . Alcohol Use: No  . Drug Use: No  . Sexual Activity: Yes   Other Topics Concern  . Not on file   Social History Narrative  . No narrative on file    Past Surgical History  Procedure Laterality Date  . Total hip arthroplasty  2001    left  . Knee arthroscopy  2007    right  . Bilateral inguinal hernia  2008    Family History  Problem Relation Age of Onset  . Colon cancer Neg Hx     Allergies  Allergen Reactions  . Povidone     REACTION: rash    Current Outpatient Prescriptions on File Prior to Visit  Medication Sig Dispense Refill  .  HYDROcodone-acetaminophen (VICODIN) 5-500 MG per tablet Take 1 tablet by mouth every 4 (four) hours as needed.        . IBUPROFEN PO Take by mouth as needed.      . multivitamin (THERAGRAN) per tablet Take 1 tablet by mouth daily.        . sildenafil (VIAGRA) 100 MG tablet Take 100 mg by mouth daily as needed.        . vitamin C (ASCORBIC ACID) 250 MG tablet Take 250 mg by mouth daily.         No current facility-administered medications on file prior to visit.    BP 156/90  Pulse 72  Temp(Src) 98.2 F (36.8 C)  Resp 16  Ht 6' (1.829 m)  Wt 194 lb (87.998 kg)  BMI 26.31 kg/m2    Objective:   Physical Exam  Nursing note and vitals reviewed. Constitutional: He is oriented to person, place, and time. He appears well-developed and well-nourished.  HENT:  Head: Normocephalic and atraumatic.  Eyes: Conjunctivae are normal. Pupils are equal, round, and reactive to light.  Neck: Normal range of motion. Neck supple.  Cardiovascular: Normal rate and regular rhythm.   Pulmonary/Chest: Effort normal and breath sounds normal.  Abdominal: Soft. Bowel sounds are normal.  Genitourinary:  Deferred to urologist  Musculoskeletal: He exhibits edema and tenderness.  Neurological: He is alert and oriented to person,  place, and time.  Skin: Skin is warm and dry.  Psychiatric: He has a normal mood and affect. His behavior is normal.          Assessment & Plan:   Patient presents for yearly preventative medicine examination.   all immunizations and health maintenance protocols were reviewed with the patient and they are up to date with these protocols.   screening laboratory values were reviewed with the patient including screening of hyperlipidemia PSA renal function and hepatic function.   There medications past medical history social history problem list and allergies were reviewed in detail.   Goals were established with regard to weight loss exercise diet in compliance with  medications discussion of the PSA    kril oil discussed OA

## 2013-12-03 ENCOUNTER — Other Ambulatory Visit: Payer: BC Managed Care – PPO

## 2013-12-10 ENCOUNTER — Encounter: Payer: BC Managed Care – PPO | Admitting: Internal Medicine

## 2014-03-18 ENCOUNTER — Other Ambulatory Visit (INDEPENDENT_AMBULATORY_CARE_PROVIDER_SITE_OTHER): Payer: BC Managed Care – PPO

## 2014-03-18 DIAGNOSIS — E785 Hyperlipidemia, unspecified: Secondary | ICD-10-CM

## 2014-03-18 DIAGNOSIS — R972 Elevated prostate specific antigen [PSA]: Secondary | ICD-10-CM

## 2014-03-18 LAB — LIPID PANEL
CHOLESTEROL: 133 mg/dL (ref 0–200)
HDL: 47.8 mg/dL (ref >39.00)
LDL Cholesterol: 69 mg/dL (ref 0–99)
TRIGLYCERIDES: 82 mg/dL (ref 0.0–149.0)
Total CHOL/HDL Ratio: 3
VLDL: 16.4 mg/dL (ref 0.0–40.0)

## 2014-03-18 LAB — HEPATIC FUNCTION PANEL
ALBUMIN: 4.1 g/dL (ref 3.5–5.2)
ALK PHOS: 47 U/L (ref 39–117)
ALT: 33 U/L (ref 0–53)
AST: 34 U/L (ref 0–37)
Bilirubin, Direct: 0 mg/dL (ref 0.0–0.3)
TOTAL PROTEIN: 7.2 g/dL (ref 6.0–8.3)
Total Bilirubin: 0.7 mg/dL (ref 0.3–1.2)

## 2014-03-21 ENCOUNTER — Encounter: Payer: Self-pay | Admitting: Internal Medicine

## 2014-03-25 ENCOUNTER — Encounter: Payer: Self-pay | Admitting: Internal Medicine

## 2014-03-25 DIAGNOSIS — R972 Elevated prostate specific antigen [PSA]: Secondary | ICD-10-CM

## 2014-03-28 ENCOUNTER — Telehealth: Payer: Self-pay | Admitting: Internal Medicine

## 2014-03-28 DIAGNOSIS — R972 Elevated prostate specific antigen [PSA]: Secondary | ICD-10-CM

## 2014-03-28 NOTE — Telephone Encounter (Signed)
Pt had labs done on 4/17 and it looks like his PSA was not done. Pt would like to know why this was not done, and also would like to schedule to come in for the PSA, Do you need to put the order in?  Pt would like to be assured he will not be charged any additional amount besides the lab.  Is it ok to sch?

## 2014-03-28 NOTE — Telephone Encounter (Signed)
All the psa under the diagnosis in his chart for elevated psa

## 2014-03-29 NOTE — Telephone Encounter (Signed)
Lab order placed, please call and schedule

## 2014-04-01 NOTE — Telephone Encounter (Signed)
Discussed with patient will report out finding on mail order

## 2014-04-01 NOTE — Addendum Note (Signed)
Addended by: Elmer Picker on: 04/01/2014 09:26 AM   Modules accepted: Orders

## 2014-04-01 NOTE — Telephone Encounter (Signed)
Pt would like to meet w/ you one more time to discuss his psa results and also states he has a rash he wants something for. Pt refused to see someone else until he asked if you would see him. pls advise.

## 2014-04-02 LAB — PSA, TOTAL AND FREE
PSA FREE PCT: 15 % — AB (ref >25)
PSA, Free: 1.16 ng/mL
PSA: 7.64 ng/mL — ABNORMAL HIGH (ref <=4.00)

## 2014-04-08 ENCOUNTER — Encounter: Payer: Self-pay | Admitting: Internal Medicine

## 2014-05-07 ENCOUNTER — Telehealth: Payer: Self-pay | Admitting: Internal Medicine

## 2014-05-07 NOTE — Telephone Encounter (Signed)
Pt states he spoke with dr. Raliegh Ip personally 2weeks ago in his office, regarding establishing him as a new pt, please confirm if dr.k will accept him.

## 2014-05-16 NOTE — Telephone Encounter (Signed)
Pt has appt with Dr. Raliegh Ip

## 2014-05-16 NOTE — Telephone Encounter (Signed)
ok 

## 2014-09-11 ENCOUNTER — Other Ambulatory Visit: Payer: BC Managed Care – PPO

## 2014-09-18 ENCOUNTER — Encounter: Payer: BC Managed Care – PPO | Admitting: Internal Medicine

## 2014-09-18 ENCOUNTER — Other Ambulatory Visit (INDEPENDENT_AMBULATORY_CARE_PROVIDER_SITE_OTHER): Payer: BC Managed Care – PPO

## 2014-09-18 DIAGNOSIS — R972 Elevated prostate specific antigen [PSA]: Secondary | ICD-10-CM

## 2014-09-18 DIAGNOSIS — Z0000 Encounter for general adult medical examination without abnormal findings: Secondary | ICD-10-CM

## 2014-09-18 LAB — POCT URINALYSIS DIPSTICK
Bilirubin, UA: NEGATIVE
Blood, UA: NEGATIVE
Glucose, UA: NEGATIVE
Ketones, UA: NEGATIVE
Leukocytes, UA: NEGATIVE
Nitrite, UA: NEGATIVE
SPEC GRAV UA: 1.02
UROBILINOGEN UA: 0.2
pH, UA: 6

## 2014-09-18 LAB — CBC WITH DIFFERENTIAL/PLATELET
BASOS PCT: 0.3 % (ref 0.0–3.0)
Basophils Absolute: 0 10*3/uL (ref 0.0–0.1)
EOS ABS: 0.2 10*3/uL (ref 0.0–0.7)
EOS PCT: 2.7 % (ref 0.0–5.0)
HCT: 44.6 % (ref 39.0–52.0)
HEMOGLOBIN: 14.8 g/dL (ref 13.0–17.0)
Lymphocytes Relative: 15.4 % (ref 12.0–46.0)
Lymphs Abs: 1 10*3/uL (ref 0.7–4.0)
MCHC: 33.1 g/dL (ref 30.0–36.0)
MCV: 93.4 fl (ref 78.0–100.0)
MONOS PCT: 8.9 % (ref 3.0–12.0)
Monocytes Absolute: 0.6 10*3/uL (ref 0.1–1.0)
NEUTROS PCT: 72.7 % (ref 43.0–77.0)
Neutro Abs: 4.9 10*3/uL (ref 1.4–7.7)
Platelets: 231 10*3/uL (ref 150.0–400.0)
RBC: 4.77 Mil/uL (ref 4.22–5.81)
RDW: 15.1 % (ref 11.5–15.5)
WBC: 6.8 10*3/uL (ref 4.0–10.5)

## 2014-09-18 LAB — TSH: TSH: 1.36 u[IU]/mL (ref 0.35–4.50)

## 2014-09-18 LAB — LIPID PANEL
CHOL/HDL RATIO: 3
Cholesterol: 133 mg/dL (ref 0–200)
HDL: 38.3 mg/dL — ABNORMAL LOW (ref >39.00)
LDL CALC: 81 mg/dL (ref 0–99)
NonHDL: 94.7
Triglycerides: 71 mg/dL (ref 0.0–149.0)
VLDL: 14.2 mg/dL (ref 0.0–40.0)

## 2014-09-18 LAB — BASIC METABOLIC PANEL
BUN: 18 mg/dL (ref 6–23)
CALCIUM: 9.5 mg/dL (ref 8.4–10.5)
CO2: 28 meq/L (ref 19–32)
Chloride: 107 mEq/L (ref 96–112)
Creatinine, Ser: 0.8 mg/dL (ref 0.4–1.5)
GFR: 100.32 mL/min (ref >60.00)
Glucose, Bld: 101 mg/dL — ABNORMAL HIGH (ref 70–99)
Potassium: 4.8 mEq/L (ref 3.5–5.1)
SODIUM: 144 meq/L (ref 135–145)

## 2014-09-18 LAB — HEPATIC FUNCTION PANEL
ALBUMIN: 3.7 g/dL (ref 3.5–5.2)
ALK PHOS: 46 U/L (ref 39–117)
ALT: 26 U/L (ref 0–53)
AST: 27 U/L (ref 0–37)
BILIRUBIN DIRECT: 0.1 mg/dL (ref 0.0–0.3)
Total Bilirubin: 0.8 mg/dL (ref 0.2–1.2)
Total Protein: 7.8 g/dL (ref 6.0–8.3)

## 2014-09-19 LAB — PSA, TOTAL AND FREE
PSA FREE: 1.09 ng/mL
PSA, Free Pct: 14 % — ABNORMAL LOW (ref >25)
PSA: 7.71 ng/mL — ABNORMAL HIGH (ref <=4.00)

## 2014-09-26 ENCOUNTER — Ambulatory Visit (INDEPENDENT_AMBULATORY_CARE_PROVIDER_SITE_OTHER): Payer: BC Managed Care – PPO | Admitting: Internal Medicine

## 2014-09-26 ENCOUNTER — Encounter: Payer: Self-pay | Admitting: Internal Medicine

## 2014-09-26 VITALS — BP 170/100 | HR 67 | Temp 98.0°F | Resp 20 | Ht 70.5 in | Wt 187.0 lb

## 2014-09-26 DIAGNOSIS — Z0000 Encounter for general adult medical examination without abnormal findings: Secondary | ICD-10-CM

## 2014-09-26 DIAGNOSIS — Z23 Encounter for immunization: Secondary | ICD-10-CM

## 2014-09-26 DIAGNOSIS — Z86010 Personal history of colonic polyps: Secondary | ICD-10-CM

## 2014-09-26 NOTE — Progress Notes (Signed)
Subjective:    Patient ID: Ruben Chavez, male    DOB: 06/04/1951, 63 y.o.   MRN: [id number]  HPI 63 year old patient who is seen today for a preventive health examination.  He has a prior history of dyslipidemia that has normalized following dramatic weight loss.  Number of years ago. He has a number of orthopedic issues, which have been stable He is followed by urology for low-grade prostate cancer. He does have a history of colonic polyps and is deferring followup colonoscopy until he is Medicare eligible   Family history mother died of complications of probable pancreatic cancer Father died age 85 of an MI  One brother, age 46 one sister, age 78, in good health Past Medical History  Diagnosis Date  . Hyperlipidemia   . Elevated PSA   . Prostate cancer 2013    History   Social History  . Marital Status: Married    Spouse Name: N/A    Number of Children: N/A  . Years of Education: N/A   Occupational History  . Not on file.   Social History Main Topics  . Smoking status: Former Smoker    Quit date: 06/06/1999  . Smokeless tobacco: Never Used  . Alcohol Use: No  . Drug Use: No  . Sexual Activity: Yes   Other Topics Concern  . Not on file   Social History Narrative  . No narrative on file    Past Surgical History  Procedure Laterality Date  . Total hip arthroplasty  2001    left  . Knee arthroscopy  2007    right  . Bilateral inguinal hernia  2008    Family History  Problem Relation Age of Onset  . Colon cancer Neg Hx     Allergies  Allergen Reactions  . Povidone-Iodine     REACTION: rash    Current Outpatient Prescriptions on File Prior to Visit  Medication Sig Dispense Refill  . HYDROcodone-acetaminophen (VICODIN) 5-500 MG per tablet Take 1 tablet by mouth every 4 (four) hours as needed.        . multivitamin (THERAGRAN) per tablet Take 1 tablet by mouth daily.        . sildenafil (VIAGRA) 100 MG tablet Take 100 mg by mouth daily as  needed.         No current facility-administered medications on file prior to visit.    BP 170/100  Pulse 67  Temp(Src) 98 F (36.7 C) (Oral)  Resp 20  Ht 5' 10.5" (1.791 m)  Wt 187 lb (84.823 kg)  BMI 26.44 kg/m2  SpO2 98%      Review of Systems  Constitutional: Negative for fever, chills, activity change, appetite change and fatigue.  HENT: Negative for congestion, dental problem, ear pain, hearing loss, mouth sores, rhinorrhea, sinus pressure, sneezing, tinnitus, trouble swallowing and voice change.   Eyes: Negative for photophobia, pain, redness and visual disturbance.  Respiratory: Negative for apnea, cough, choking, chest tightness, shortness of breath and wheezing.   Cardiovascular: Negative for chest pain, palpitations and leg swelling.  Gastrointestinal: Negative for nausea, vomiting, abdominal pain, diarrhea, constipation, blood in stool, abdominal distention, anal bleeding and rectal pain.  Genitourinary: Negative for dysuria, urgency, frequency, hematuria, flank pain, decreased urine volume, discharge, penile swelling, scrotal swelling, difficulty urinating, genital sores and testicular pain.  Musculoskeletal: Positive for arthralgias and back pain. Negative for gait problem, joint swelling, myalgias, neck pain and neck stiffness.  Skin: Negative for color change, rash and wound.  Neurological: Negative for dizziness, tremors, seizures, syncope, facial asymmetry, speech difficulty, weakness, light-headedness, numbness and headaches.  Hematological: Negative for adenopathy. Does not bruise/bleed easily.  Psychiatric/Behavioral: Negative for suicidal ideas, hallucinations, behavioral problems, confusion, sleep disturbance, self-injury, dysphoric mood, decreased concentration and agitation. The patient is not nervous/anxious.        Objective:   Physical Exam  Constitutional: He appears well-developed and well-nourished.  Blood pressure 170/100  HENT:  Head:  Normocephalic and atraumatic.  Right Ear: External ear normal.  Left Ear: External ear normal.  Nose: Nose normal.  Mouth/Throat: Oropharynx is clear and moist.  Eyes: Conjunctivae and EOM are normal. Pupils are equal, round, and reactive to light. No scleral icterus.  Neck: Normal range of motion. Neck supple. No JVD present. No thyromegaly present.  Cardiovascular: Regular rhythm and normal heart sounds.  Exam reveals no gallop and no friction rub.   No murmur heard. Decreased right dorsalis pedis pulse  Pulmonary/Chest: Effort normal and breath sounds normal. He exhibits no tenderness.  Abdominal: Soft. Bowel sounds are normal. He exhibits no distension and no mass. There is no tenderness.  Genitourinary: Prostate normal and penis normal.  Musculoskeletal: Normal range of motion. He exhibits no edema and no tenderness.  Lymphadenopathy:    He has no cervical adenopathy.  Neurological: He is alert. He has normal reflexes. No cranial nerve deficit. Coordination normal.  Skin: Skin is warm and dry. No rash noted.  Psychiatric: He has a normal mood and affect. His behavior is normal.          Assessment & Plan:   Rule out sustained hypertension.  Careful home blood pressure monitoring.  Will be initiated.  Low salt diet recommended Preventive health examination History of prostate cancer.  Followup urology   Recheck 4 weeks

## 2014-09-26 NOTE — Progress Notes (Signed)
Pre visit review using our clinic review tool, if applicable. No additional management support is needed unless otherwise documented below in the visit note. 

## 2014-09-26 NOTE — Patient Instructions (Addendum)
Health Maintenance A healthy lifestyle and preventative care can promote health and wellness.  Maintain regular health, dental, and eye exams.  Eat a healthy diet. Foods like vegetables, fruits, whole grains, low-fat dairy products, and lean protein foods contain the nutrients you need and are low in calories. Decrease your intake of foods high in solid fats, added sugars, and salt. Get information about a proper diet from your health care provider, if necessary.  Regular physical exercise is one of the most important things you can do for your health. Most adults should get at least 150 minutes of moderate-intensity exercise (any activity that increases your heart rate and causes you to sweat) each week. In addition, most adults need muscle-strengthening exercises on 2 or more days a week.   Maintain a healthy weight. The body mass index (BMI) is a screening tool to identify possible weight problems. It provides an estimate of body fat based on height and weight. Your health care provider can find your BMI and can help you achieve or maintain a healthy weight. For males 20 years and older:  A BMI below 18.5 is considered underweight.  A BMI of 18.5 to 24.9 is normal.  A BMI of 25 to 29.9 is considered overweight.  A BMI of 30 and above is considered obese.  Maintain normal blood lipids and cholesterol by exercising and minimizing your intake of saturated fat. Eat a balanced diet with plenty of fruits and vegetables. Blood tests for lipids and cholesterol should begin at age 20 and be repeated every 5 years. If your lipid or cholesterol levels are high, you are over age 50, or you are at high risk for heart disease, you may need your cholesterol levels checked more frequently.Ongoing high lipid and cholesterol levels should be treated with medicines if diet and exercise are not working.  If you smoke, find out from your health care provider how to quit. If you do not use tobacco, do not  start.  Lung cancer screening is recommended for adults aged 55-80 years who are at high risk for developing lung cancer because of a history of smoking. A yearly low-dose CT scan of the lungs is recommended for people who have at least a 30-pack-year history of smoking and are current smokers or have quit within the past 15 years. A pack year of smoking is smoking an average of 1 pack of cigarettes a day for 1 year (for example, a 30-pack-year history of smoking could mean smoking 1 pack a day for 30 years or 2 packs a day for 15 years). Yearly screening should continue until the smoker has stopped smoking for at least 15 years. Yearly screening should be stopped for people who develop a health problem that would prevent them from having lung cancer treatment.  If you choose to drink alcohol, do not have more than 2 drinks per day. One drink is considered to be 12 oz (360 mL) of beer, 5 oz (150 mL) of wine, or 1.5 oz (45 mL) of liquor.  Avoid the use of street drugs. Do not share needles with anyone. Ask for help if you need support or instructions about stopping the use of drugs.  High blood pressure causes heart disease and increases the risk of stroke. Blood pressure should be checked at least every 1-2 years. Ongoing high blood pressure should be treated with medicines if weight loss and exercise are not effective.  If you are 45-79 years old, ask your health care provider if   you should take aspirin to prevent heart disease.  Diabetes screening involves taking a blood sample to check your fasting blood sugar level. This should be done once every 3 years after age 45 if you are at a normal weight and without risk factors for diabetes. Testing should be considered at a younger age or be carried out more frequently if you are overweight and have at least 1 risk factor for diabetes.  Colorectal cancer can be detected and often prevented. Most routine colorectal cancer screening begins at the age of 50  and continues through age 75. However, your health care provider may recommend screening at an earlier age if you have risk factors for colon cancer. On a yearly basis, your health care provider may provide home test kits to check for hidden blood in the stool. A small camera at the end of a tube may be used to directly examine the colon (sigmoidoscopy or colonoscopy) to detect the earliest forms of colorectal cancer. Talk to your health care provider about this at age 50 when routine screening begins. A direct exam of the colon should be repeated every 5-10 years through age 75, unless early forms of precancerous polyps or small growths are found.  People who are at an increased risk for hepatitis B should be screened for this virus. You are considered at high risk for hepatitis B if:  You were born in a country where hepatitis B occurs often. Talk with your health care provider about which countries are considered high risk.  Your parents were born in a high-risk country and you have not received a shot to protect against hepatitis B (hepatitis B vaccine).  You have HIV or AIDS.  You use needles to inject street drugs.  You live with, or have sex with, someone who has hepatitis B.  You are a man who has sex with other men (MSM).  You get hemodialysis treatment.  You take certain medicines for conditions like cancer, organ transplantation, and autoimmune conditions.  Hepatitis C blood testing is recommended for all people born from 1945 through 1965 and any individual with known risk factors for hepatitis C.  Healthy men should no longer receive prostate-specific antigen (PSA) blood tests as part of routine cancer screening. Talk to your health care provider about prostate cancer screening.  Testicular cancer screening is not recommended for adolescents or adult males who have no symptoms. Screening includes self-exam, a health care provider exam, and other screening tests. Consult with your  health care provider about any symptoms you have or any concerns you have about testicular cancer.  Practice safe sex. Use condoms and avoid high-risk sexual practices to reduce the spread of sexually transmitted infections (STIs).  You should be screened for STIs, including gonorrhea and chlamydia if:  You are sexually active and are younger than 24 years.  You are older than 24 years, and your health care provider tells you that you are at risk for this type of infection.  Your sexual activity has changed since you were last screened, and you are at an increased risk for chlamydia or gonorrhea. Ask your health care provider if you are at risk.  If you are at risk of being infected with HIV, it is recommended that you take a prescription medicine daily to prevent HIV infection. This is called pre-exposure prophylaxis (PrEP). You are considered at risk if:  You are a man who has sex with other men (MSM).  You are a heterosexual man who   is sexually active with multiple partners.  You take drugs by injection.  You are sexually active with a partner who has HIV.  Talk with your health care provider about whether you are at high risk of being infected with HIV. If you choose to begin PrEP, you should first be tested for HIV. You should then be tested every 3 months for as long as you are taking PrEP.  Use sunscreen. Apply sunscreen liberally and repeatedly throughout the day. You should seek shade when your shadow is shorter than you. Protect yourself by wearing long sleeves, pants, a wide-brimmed hat, and sunglasses year round whenever you are outdoors.  Tell your health care provider of new moles or changes in moles, especially if there is a change in shape or color. Also, tell your health care provider if a mole is larger than the size of a pencil eraser.  A one-time screening for abdominal aortic aneurysm (AAA) and surgical repair of large AAAs by ultrasound is recommended for men aged  65-75 years who are current or former smokers.  Stay current with your vaccines (immunizations). Document Released: 05/20/2008 Document Revised: 11/27/2013 Document Reviewed: 04/19/2011 [employer] Patient Information 2015 [employer]. This information is not intended to replace advice given to you by your health care provider. Make sure you discuss any questions you have with your health care provider. DASH Eating Plan DASH stands for "Dietary Approaches to Stop Hypertension." The DASH eating plan is a healthy eating plan that has been shown to reduce high blood pressure (hypertension). Additional health benefits may include reducing the risk of type 2 diabetes mellitus, heart disease, and stroke. The DASH eating plan may also help with weight loss. WHAT DO I NEED TO KNOW ABOUT THE DASH EATING PLAN? For the DASH eating plan, you will follow these general guidelines:  Choose foods with a percent daily value for sodium of less than 5% (as listed on the food label).  Use salt-free seasonings or herbs instead of table salt or sea salt.  Check with your health care provider or pharmacist before using salt substitutes.  Eat lower-sodium products, often labeled as "lower sodium" or "no salt added."  Eat fresh foods.  Eat more vegetables, fruits, and low-fat dairy products.  Choose whole grains. Look for the word "whole" as the first word in the ingredient list.  Choose fish and skinless chicken or turkey more often than red meat. Limit fish, poultry, and meat to 6 oz (170 g) each day.  Limit sweets, desserts, sugars, and sugary drinks.  Choose heart-healthy fats.  Limit cheese to 1 oz (28 g) per day.  Eat more home-cooked food and less restaurant, buffet, and fast food.  Limit fried foods.  Cook foods using methods other than frying.  Limit canned vegetables. If you do use them, rinse them well to decrease the sodium.  When eating at a restaurant, ask that your food be prepared  with less salt, or no salt if possible. WHAT FOODS CAN I EAT? Seek help from a dietitian for individual calorie needs. Grains Whole grain or whole wheat bread. Brown rice. Whole grain or whole wheat pasta. Quinoa, bulgur, and whole grain cereals. Low-sodium cereals. Corn or whole wheat flour tortillas. Whole grain cornbread. Whole grain crackers. Low-sodium crackers. Vegetables Fresh or frozen vegetables (raw, steamed, roasted, or grilled). Low-sodium or reduced-sodium tomato and vegetable juices. Low-sodium or reduced-sodium tomato sauce and paste. Low-sodium or reduced-sodium canned vegetables.  Fruits All fresh, canned (in natural juice), or frozen   fruits. Meat and Other Protein Products Ground beef (85% or leaner), grass-fed beef, or beef trimmed of fat. Skinless chicken or [place]. Ground chicken or [place]. Pork trimmed of fat. All fish and seafood. Eggs. Dried beans, peas, or lentils. Unsalted nuts and seeds. Unsalted canned beans. Dairy Low-fat dairy products, such as skim or 1% milk, 2% or reduced-fat cheeses, low-fat ricotta or cottage cheese, or plain low-fat yogurt. Low-sodium or reduced-sodium cheeses. Fats and Oils Tub margarines without trans fats. Light or reduced-fat mayonnaise and salad dressings (reduced sodium). Avocado. Safflower, olive, or canola oils. Natural peanut or almond butter. Other Unsalted popcorn and pretzels. The items listed above may not be a complete list of recommended foods or beverages. Contact your dietitian for more options. WHAT FOODS ARE NOT RECOMMENDED? Grains White bread. White pasta. White rice. Refined cornbread. Bagels and croissants. Crackers that contain trans fat. Vegetables Creamed or fried vegetables. Vegetables in a cheese sauce. Regular canned vegetables. Regular canned tomato sauce and paste. Regular tomato and vegetable juices. Fruits Dried fruits. Canned fruit in light or heavy syrup. Fruit juice. Meat and Other Protein Products Fatty  cuts of meat. Ribs, chicken wings, bacon, sausage, bologna, salami, chitterlings, fatback, hot dogs, bratwurst, and packaged luncheon meats. Salted nuts and seeds. Canned beans with salt. Dairy Whole or 2% milk, cream, half-and-half, and cream cheese. Whole-fat or sweetened yogurt. Full-fat cheeses or blue cheese. Nondairy creamers and whipped toppings. Processed cheese, cheese spreads, or cheese curds. Condiments Onion and garlic salt, seasoned salt, table salt, and sea salt. Canned and packaged gravies. Worcestershire sauce. Tartar sauce. Barbecue sauce. Teriyaki sauce. Soy sauce, including reduced sodium. Steak sauce. Fish sauce. Oyster sauce. Cocktail sauce. Horseradish. Ketchup and mustard. Meat flavorings and tenderizers. Bouillon cubes. Hot sauce. Tabasco sauce. Marinades. Taco seasonings. Relishes. Fats and Oils Butter, stick margarine, lard, shortening, ghee, and bacon fat. Coconut, palm kernel, or palm oils. Regular salad dressings. Other Pickles and olives. Salted popcorn and pretzels. The items listed above may not be a complete list of foods and beverages to avoid. Contact your dietitian for more information. WHERE CAN I FIND MORE INFORMATION? National Heart, Lung, and Blood Institute: travelstabloid.com Document Released: 11/11/2011 Document Revised: 04/08/2014 Document Reviewed: 09/26/2013 [facility] Patient Information 2015 [place], [place]. This information is not intended to replace advice given to you by your health care provider. Make sure you discuss any questions you have with your health care provider.  Please check your blood pressure on a regular basis.  If it is consistently greater than 150/90, please make an office appointment.

## 2014-10-07 ENCOUNTER — Telehealth: Payer: Self-pay | Admitting: Internal Medicine

## 2014-10-07 MED ORDER — HYDROCHLOROTHIAZIDE 25 MG PO TABS: 25.0000 mg | ORAL_TABLET | Freq: Every day | ORAL | Status: DC

## 2014-10-07 NOTE — Telephone Encounter (Signed)
Please call in a prescription for hydrochlorothiazide 25 mg #90 Notify patient of new prescription Follow-up office visit in 4-6 weeks Please ask  patient to bring home blood pressure monitor at his next visit

## 2014-10-07 NOTE — Telephone Encounter (Signed)
Pt is calling with bp results average from last 7 days 157/86 taking in am and pm 15 times. Pt was at urgent care on July 31 and bp was 149/84

## 2014-10-07 NOTE — Telephone Encounter (Signed)
Please advise 

## 2014-10-07 NOTE — Telephone Encounter (Signed)
Spoke to pt, told him Rx for Hydrochlorothiazide 25 mg one tablet daily was sent to pharmacy. Also need to schedule follow up in 4-6 weeks and bring blood pressure monitor with you to visit per Dr. Raliegh Ip. Pt verbalized understanding. Rx sent to pharmacy.

## 2014-11-27 ENCOUNTER — Encounter: Payer: Self-pay | Admitting: Internal Medicine

## 2014-11-27 ENCOUNTER — Ambulatory Visit (INDEPENDENT_AMBULATORY_CARE_PROVIDER_SITE_OTHER): Payer: BC Managed Care – PPO | Admitting: Internal Medicine

## 2014-11-27 VITALS — BP 146/80 | HR 88 | Temp 97.8°F | Resp 20 | Ht 70.5 in | Wt 186.0 lb

## 2014-11-27 DIAGNOSIS — I10 Essential (primary) hypertension: Secondary | ICD-10-CM

## 2014-11-27 DIAGNOSIS — E785 Hyperlipidemia, unspecified: Secondary | ICD-10-CM

## 2014-11-27 MED ORDER — HYDROCODONE-ACETAMINOPHEN 5-500 MG PO TABS: 1.0000 | ORAL_TABLET | ORAL | Status: DC | PRN

## 2014-11-27 MED ORDER — LISINOPRIL-HYDROCHLOROTHIAZIDE 20-25 MG PO TABS: 1.0000 | ORAL_TABLET | Freq: Every day | ORAL | Status: DC

## 2014-11-27 NOTE — Progress Notes (Signed)
Pre visit review using our clinic review tool, if applicable. No additional management support is needed unless otherwise documented below in the visit note. 

## 2014-11-27 NOTE — Progress Notes (Signed)
Subjective:    Patient ID: Ruben Chavez, male    DOB: Apr 24, 1951, 63 y.o.   MRN: [id number]  HPI 63 year old patient who is seen today for follow-up of hypertension.  After home blood pressure monitoring.  He was initially placed on diuretic therapy.  Blood pressure has improved but still at times remains moderately elevated. He brings his home blood pressure monitor today and there is nice.  Correlation with our blood pressure readings.  Blood pressure on arrival 146 over 80.  Past Medical History  Diagnosis Date  . Hyperlipidemia   . Elevated PSA   . Prostate cancer 2013    History   Social History  . Marital Status: Married    Spouse Name: N/A    Number of Children: N/A  . Years of Education: N/A   Occupational History  . Not on file.   Social History Main Topics  . Smoking status: Former Smoker    Quit date: 06/06/1999  . Smokeless tobacco: Never Used  . Alcohol Use: No  . Drug Use: No  . Sexual Activity: Yes   Other Topics Concern  . Not on file   Social History Narrative    Past Surgical History  Procedure Laterality Date  . Total hip arthroplasty  2001    left  . Knee arthroscopy  2007    right  . Bilateral inguinal hernia  2008    Family History  Problem Relation Age of Onset  . Colon cancer Neg Hx     Allergies  Allergen Reactions  . Povidone-Iodine     REACTION: rash    Current Outpatient Prescriptions on File Prior to Visit  Medication Sig Dispense Refill  . hydrochlorothiazide (HYDRODIURIL) 25 MG tablet Take 1 tablet (25 mg total) by mouth daily. 90 tablet 1  . HYDROcodone-acetaminophen (VICODIN) 5-500 MG per tablet Take 1 tablet by mouth every 4 (four) hours as needed.      Javier Docker Oil 300 MG CAPS Take 1 capsule by mouth daily.    . multivitamin (THERAGRAN) per tablet Take 1 tablet by mouth daily.      . sildenafil (VIAGRA) 100 MG tablet Take 100 mg by mouth daily as needed.       No current facility-administered medications on  file prior to visit.    BP 146/80 mmHg  Pulse 88  Temp(Src) 97.8 F (36.6 C) (Oral)  Resp 20  Ht 5' 10.5" (1.791 m)  Wt 186 lb (84.369 kg)  BMI 26.30 kg/m2  SpO2 98%      Review of Systems  Constitutional: Negative for fever, chills, appetite change and fatigue.  HENT: Negative for congestion, dental problem, ear pain, hearing loss, sore throat, tinnitus, trouble swallowing and voice change.   Eyes: Negative for pain, discharge and visual disturbance.  Respiratory: Negative for cough, chest tightness, wheezing and stridor.   Cardiovascular: Negative for chest pain, palpitations and leg swelling.  Gastrointestinal: Negative for nausea, vomiting, abdominal pain, diarrhea, constipation, blood in stool and abdominal distention.  Genitourinary: Negative for urgency, hematuria, flank pain, discharge, difficulty urinating and genital sores.  Musculoskeletal: Negative for myalgias, back pain, joint swelling, arthralgias, gait problem and neck stiffness.  Skin: Negative for rash.  Neurological: Negative for dizziness, syncope, speech difficulty, weakness, numbness and headaches.  Hematological: Negative for adenopathy. Does not bruise/bleed easily.  Psychiatric/Behavioral: Negative for behavioral problems and dysphoric mood. The patient is not nervous/anxious.        Objective:   Physical Exam  Constitutional:  He appears well-developed and well-nourished. No distress.  Blood pressure 160/80          Assessment & Plan:   Essential hypertension.  Suboptimal control.  Will change to lisinopril hydrochlorothiazide combination.  Continue home blood pressure monitoring.  Continue restricted salt diet

## 2014-11-27 NOTE — Patient Instructions (Signed)
Limit your sodium (Salt) intake  Please check your blood pressure on a regular basis.  If it is consistently greater than 150/90, please make an office appointment.   

## 2014-11-28 ENCOUNTER — Telehealth: Payer: Self-pay | Admitting: Internal Medicine

## 2014-11-28 NOTE — Telephone Encounter (Signed)
emmi emailed °

## 2014-12-06 HISTORY — PX: CYST REMOVAL TRUNK: SHX6283

## 2014-12-09 ENCOUNTER — Encounter: Payer: Self-pay | Admitting: Internal Medicine

## 2015-01-28 ENCOUNTER — Encounter: Payer: Self-pay | Admitting: Gastroenterology

## 2015-02-25 ENCOUNTER — Encounter: Payer: Self-pay | Admitting: Internal Medicine

## 2015-02-25 DIAGNOSIS — D179 Benign lipomatous neoplasm, unspecified: Secondary | ICD-10-CM

## 2015-02-27 ENCOUNTER — Telehealth: Payer: Self-pay | Admitting: Internal Medicine

## 2015-02-27 NOTE — Telephone Encounter (Signed)
Pt was referred to [place] [facility] for  Lipoma resection need to know where this is located please call [facility]  Address: [address], [place], [place] [address]  [phone] TO ADVISE

## 2015-03-03 NOTE — Telephone Encounter (Signed)
Left detailed message for Sonja on her voicemail that I believe the Lipoma is on his back. Any questions please call.

## 2015-04-30 ENCOUNTER — Other Ambulatory Visit: Payer: Self-pay

## 2015-06-02 ENCOUNTER — Other Ambulatory Visit: Payer: Self-pay

## 2015-06-26 ENCOUNTER — Other Ambulatory Visit (INDEPENDENT_AMBULATORY_CARE_PROVIDER_SITE_OTHER): Payer: BLUE CROSS/BLUE SHIELD

## 2015-06-26 DIAGNOSIS — R972 Elevated prostate specific antigen [PSA]: Secondary | ICD-10-CM

## 2015-06-27 LAB — PSA, TOTAL AND FREE
PSA, Free Pct: 19 % — ABNORMAL LOW (ref >25)
PSA, Free: 1.48 ng/mL
PSA: 7.9 ng/mL — ABNORMAL HIGH (ref <=4.00)

## 2015-09-23 ENCOUNTER — Other Ambulatory Visit (INDEPENDENT_AMBULATORY_CARE_PROVIDER_SITE_OTHER): Payer: BLUE CROSS/BLUE SHIELD

## 2015-09-23 DIAGNOSIS — Z0000 Encounter for general adult medical examination without abnormal findings: Secondary | ICD-10-CM

## 2015-09-23 LAB — LIPID PANEL
CHOL/HDL RATIO: 3
Cholesterol: 143 mg/dL (ref 0–200)
HDL: 47.7 mg/dL (ref >39.00)
LDL Cholesterol: 79 mg/dL (ref 0–99)
NONHDL: 95.74
Triglycerides: 86 mg/dL (ref 0.0–149.0)
VLDL: 17.2 mg/dL (ref 0.0–40.0)

## 2015-09-23 LAB — CBC WITH DIFFERENTIAL/PLATELET
BASOS ABS: 0 10*3/uL (ref 0.0–0.1)
BASOS PCT: 0.4 % (ref 0.0–3.0)
EOS ABS: 0.2 10*3/uL (ref 0.0–0.7)
EOS PCT: 3.7 % (ref 0.0–5.0)
HEMATOCRIT: 40 % (ref 39.0–52.0)
HEMOGLOBIN: 13.6 g/dL (ref 13.0–17.0)
LYMPHS ABS: 1.2 10*3/uL (ref 0.7–4.0)
Lymphocytes Relative: 20 % (ref 12.0–46.0)
MCHC: 34 g/dL (ref 30.0–36.0)
MCV: 93.9 fl (ref 78.0–100.0)
MONOS PCT: 11.5 % (ref 3.0–12.0)
Monocytes Absolute: 0.7 10*3/uL (ref 0.1–1.0)
Neutro Abs: 3.8 10*3/uL (ref 1.4–7.7)
Neutrophils Relative %: 64.4 % (ref 43.0–77.0)
Platelets: 230 10*3/uL (ref 150.0–400.0)
RBC: 4.26 Mil/uL (ref 4.22–5.81)
RDW: 13.7 % (ref 11.5–15.5)
WBC: 5.8 10*3/uL (ref 4.0–10.5)

## 2015-09-23 LAB — POCT URINALYSIS DIPSTICK
BILIRUBIN UA: NEGATIVE
Glucose, UA: NEGATIVE
Ketones, UA: NEGATIVE
Leukocytes, UA: NEGATIVE
NITRITE UA: NEGATIVE
PH UA: 5.5
RBC UA: NEGATIVE
Spec Grav, UA: >=1.03
UROBILINOGEN UA: 0.2

## 2015-09-23 LAB — BASIC METABOLIC PANEL
BUN: 27 mg/dL — AB (ref 6–23)
CHLORIDE: 105 meq/L (ref 96–112)
CO2: 33 meq/L — AB (ref 19–32)
Calcium: 10.2 mg/dL (ref 8.4–10.5)
Creatinine, Ser: 1.03 mg/dL (ref 0.40–1.50)
GFR: 76.86 mL/min (ref >60.00)
GLUCOSE: 99 mg/dL (ref 70–99)
POTASSIUM: 5.6 meq/L — AB (ref 3.5–5.1)
SODIUM: 144 meq/L (ref 135–145)

## 2015-09-23 LAB — HEPATIC FUNCTION PANEL
ALK PHOS: 34 U/L — AB (ref 39–117)
ALT: 26 U/L (ref 0–53)
AST: 27 U/L (ref 0–37)
Albumin: 4.1 g/dL (ref 3.5–5.2)
BILIRUBIN DIRECT: 0.1 mg/dL (ref 0.0–0.3)
TOTAL PROTEIN: 6.9 g/dL (ref 6.0–8.3)
Total Bilirubin: 0.5 mg/dL (ref 0.2–1.2)

## 2015-09-23 LAB — TSH: TSH: 1.44 u[IU]/mL (ref 0.35–4.50)

## 2015-09-30 ENCOUNTER — Encounter: Payer: Self-pay | Admitting: Internal Medicine

## 2015-09-30 ENCOUNTER — Ambulatory Visit (INDEPENDENT_AMBULATORY_CARE_PROVIDER_SITE_OTHER): Payer: BLUE CROSS/BLUE SHIELD | Admitting: Internal Medicine

## 2015-09-30 VITALS — BP 150/94 | HR 62 | Temp 98.1°F | Resp 20 | Ht 69.0 in | Wt 186.0 lb

## 2015-09-30 DIAGNOSIS — Z0000 Encounter for general adult medical examination without abnormal findings: Secondary | ICD-10-CM | POA: Diagnosis not present

## 2015-09-30 DIAGNOSIS — E785 Hyperlipidemia, unspecified: Secondary | ICD-10-CM | POA: Diagnosis not present

## 2015-09-30 DIAGNOSIS — I10 Essential (primary) hypertension: Secondary | ICD-10-CM | POA: Diagnosis not present

## 2015-09-30 DIAGNOSIS — Z23 Encounter for immunization: Secondary | ICD-10-CM | POA: Diagnosis not present

## 2015-09-30 DIAGNOSIS — Z86010 Personal history of colonic polyps: Secondary | ICD-10-CM

## 2015-09-30 MED ORDER — LISINOPRIL-HYDROCHLOROTHIAZIDE 20-25 MG PO TABS: 1.0000 | ORAL_TABLET | Freq: Every day | ORAL | Status: DC

## 2015-09-30 MED ORDER — HYDROCODONE-ACETAMINOPHEN 5-325 MG PO TABS: 1.0000 | ORAL_TABLET | Freq: Four times a day (QID) | ORAL | Status: DC | PRN

## 2015-09-30 NOTE — Progress Notes (Signed)
Subjective:    Patient ID: Ruben Chavez, male    DOB: 1951-02-02, 64 y.o.   MRN: [id number]  HPI  64 -year-old patient who is seen today for a preventive health examination.   He has a prior history of dyslipidemia that has normalized following dramatic weight loss.  Number of years ago. He has a number of orthopedic issues, which have been stable He is followed by urology for low-grade prostate cancer. He does have a history of colonic polyps and is deferring followup colonoscopy until he is Medicare eligible after the first of the year.  Family history mother died of complications of probable pancreatic cancer Father died age 82 of an MI  One brother, age 24 one sister, age 19, in good health Past Medical History  Diagnosis Date  . Hyperlipidemia   . Elevated PSA   . Prostate cancer [facility]) 2013    Social History   Social History  . Marital Status: Married    Spouse Name: N/A  . Number of Children: N/A  . Years of Education: N/A   Occupational History  . Not on file.   Social History Main Topics  . Smoking status: Former Smoker    Quit date: 06/06/1999  . Smokeless tobacco: Never Used  . Alcohol Use: No  . Drug Use: No  . Sexual Activity: Yes   Other Topics Concern  . Not on file   Social History Narrative    Past Surgical History  Procedure Laterality Date  . Total hip arthroplasty  2001    left  . Knee arthroscopy  2007    right  . Bilateral inguinal hernia  2008    Family History  Problem Relation Age of Onset  . Colon cancer Neg Hx     Allergies  Allergen Reactions  . Povidone-Iodine     REACTION: rash    Current Outpatient Prescriptions on File Prior to Visit  Medication Sig Dispense Refill  . Krill Oil 300 MG CAPS Take 1 capsule by mouth daily.    Marland Kitchen lisinopril-hydrochlorothiazide (PRINZIDE,ZESTORETIC) 20-25 MG per tablet Take 1 tablet by mouth daily. 90 tablet 3  . multivitamin (THERAGRAN) per tablet Take 1 tablet by mouth daily.       . naproxen sodium (ANAPROX) 220 MG tablet Take 220 mg by mouth daily.    . Potassium Gluconate 550 MG TABS Take 1 tablet by mouth daily.    . sildenafil (VIAGRA) 100 MG tablet Take 100 mg by mouth daily as needed.       No current facility-administered medications on file prior to visit.    BP 150/94 mmHg  Pulse 62  Temp(Src) 98.1 F (36.7 C) (Oral)  Resp 20  Ht 5\' 9"  (1.753 m)  Wt 186 lb (84.369 kg)  BMI 27.45 kg/m2  SpO2 98%      Review of Systems  Constitutional: Negative for fever, chills, activity change, appetite change and fatigue.  HENT: Negative for congestion, dental problem, ear pain, hearing loss, mouth sores, rhinorrhea, sinus pressure, sneezing, tinnitus, trouble swallowing and voice change.   Eyes: Negative for photophobia, pain, redness and visual disturbance.  Respiratory: Negative for apnea, cough, choking, chest tightness, shortness of breath and wheezing.   Cardiovascular: Negative for chest pain, palpitations and leg swelling.  Gastrointestinal: Negative for nausea, vomiting, abdominal pain, diarrhea, constipation, blood in stool, abdominal distention, anal bleeding and rectal pain.  Genitourinary: Negative for dysuria, urgency, frequency, hematuria, flank pain, decreased urine volume, discharge, penile swelling, scrotal  swelling, difficulty urinating, genital sores and testicular pain.  Musculoskeletal: Positive for back pain and arthralgias. Negative for myalgias, joint swelling, gait problem, neck pain and neck stiffness.  Skin: Negative for color change, rash and wound.  Neurological: Negative for dizziness, tremors, seizures, syncope, facial asymmetry, speech difficulty, weakness, light-headedness, numbness and headaches.  Hematological: Negative for adenopathy. Does not bruise/bleed easily.  Psychiatric/Behavioral: Negative for suicidal ideas, hallucinations, behavioral problems, confusion, sleep disturbance, self-injury, dysphoric mood, decreased  concentration and agitation. The patient is not nervous/anxious.        Objective:   Physical Exam  Constitutional: He appears well-developed and well-nourished.  Blood pressure 140 over 82  HENT:  Head: Normocephalic and atraumatic.  Right Ear: External ear normal.  Left Ear: External ear normal.  Nose: Nose normal.  Mouth/Throat: Oropharynx is clear and moist.  Eyes: Conjunctivae and EOM are normal. Pupils are equal, round, and reactive to light. No scleral icterus.  Neck: Normal range of motion. Neck supple. No JVD present. No thyromegaly present.  Cardiovascular: Regular rhythm and normal heart sounds.  Exam reveals no gallop and no friction rub.   No murmur heard. Decreased right dorsalis pedis pulse  Pulmonary/Chest: Effort normal and breath sounds normal. He exhibits no tenderness.  Abdominal: Soft. Bowel sounds are normal. He exhibits no distension and no mass. There is no tenderness.  Genitourinary: Prostate normal and penis normal.  Musculoskeletal: Normal range of motion. He exhibits no edema or tenderness.  Lymphadenopathy:    He has no cervical adenopathy.  Neurological: He is alert. He has normal reflexes. No cranial nerve deficit. Coordination normal.  Skin: Skin is warm and dry. No rash noted.  Psychiatric: He has a normal mood and affect. His behavior is normal.          Assessment & Plan:    Preventive health examination History of prostate cancer.  Followup urology Mild dyslipidemia Hypertension, stable.  Continue home blood pressure monitoring

## 2015-09-30 NOTE — Patient Instructions (Addendum)
Limit your sodium (Salt) intake  Please check your blood pressure on a regular basis.  If it is consistently greater than 150/90, please make an office appointment.    It is important that you exercise regularly, at least 20 minutes 3 to 4 times per week.  If you develop chest pain or shortness of breath seek  medical attention.  Schedule your colonoscopy to help detect colon cancer.  Return in one year for follow-up    Health Maintenance, Male A healthy lifestyle and preventative care can promote health and wellness.  Maintain regular health, dental, and eye exams.  Eat a healthy diet. Foods like vegetables, fruits, whole grains, low-fat dairy products, and lean protein foods contain the nutrients you need and are low in calories. Decrease your intake of foods high in solid fats, added sugars, and salt. Get information about a proper diet from your health care provider, if necessary.  Regular physical exercise is one of the most important things you can do for your health. Most adults should get at least 150 minutes of moderate-intensity exercise (any activity that increases your heart rate and causes you to sweat) each week. In addition, most adults need muscle-strengthening exercises on 2 or more days a week.   Maintain a healthy weight. The body mass index (BMI) is a screening tool to identify possible weight problems. It provides an estimate of body fat based on height and weight. Your health care provider can find your BMI and can help you achieve or maintain a healthy weight. For males 20 years and older:  A BMI below 18.5 is considered underweight.  A BMI of 18.5 to 24.9 is normal.  A BMI of 25 to 29.9 is considered overweight.  A BMI of 30 and above is considered obese.  Maintain normal blood lipids and cholesterol by exercising and minimizing your intake of saturated fat. Eat a balanced diet with plenty of fruits and vegetables. Blood tests for lipids and cholesterol should  begin at age 31 and be repeated every 5 years. If your lipid or cholesterol levels are high, you are over age 25, or you are at high risk for heart disease, you may need your cholesterol levels checked more frequently.Ongoing high lipid and cholesterol levels should be treated with medicines if diet and exercise are not working.  If you smoke, find out from your health care provider how to quit. If you do not use tobacco, do not start.  Lung cancer screening is recommended for adults aged 85-80 years who are at high risk for developing lung cancer because of a history of smoking. A yearly low-dose CT scan of the lungs is recommended for people who have at least a 30-pack-year history of smoking and are current smokers or have quit within the past 15 years. A pack year of smoking is smoking an average of 1 pack of cigarettes a day for 1 year (for example, a 30-pack-year history of smoking could mean smoking 1 pack a day for 30 years or 2 packs a day for 15 years). Yearly screening should continue until the smoker has stopped smoking for at least 15 years. Yearly screening should be stopped for people who develop a health problem that would prevent them from having lung cancer treatment.  If you choose to drink alcohol, do not have more than 2 drinks per day. One drink is considered to be 12 oz (360 mL) of beer, 5 oz (150 mL) of wine, or 1.5 oz (45 mL) of  liquor.  Avoid the use of street drugs. Do not share needles with anyone. Ask for help if you need support or instructions about stopping the use of drugs.  High blood pressure causes heart disease and increases the risk of stroke. High blood pressure is more likely to develop in:  People who have blood pressure in the end of the normal range (100-139/85-89 mm Hg).  People who are overweight or obese.  People who are African American.  If you are 58-85 years of age, have your blood pressure checked every 3-5 years. If you are 31 years of age or  older, have your blood pressure checked every year. You should have your blood pressure measured twice--once when you are at a hospital or clinic, and once when you are not at a hospital or clinic. Record the average of the two measurements. To check your blood pressure when you are not at a hospital or clinic, you can use:  An automated blood pressure machine at a pharmacy.  A home blood pressure monitor.  If you are 35-59 years old, ask your health care provider if you should take aspirin to prevent heart disease.  Diabetes screening involves taking a blood sample to check your fasting blood sugar level. This should be done once every 3 years after age 32 if you are at a normal weight and without risk factors for diabetes. Testing should be considered at a younger age or be carried out more frequently if you are overweight and have at least 1 risk factor for diabetes.  Colorectal cancer can be detected and often prevented. Most routine colorectal cancer screening begins at the age of 59 and continues through age 38. However, your health care provider may recommend screening at an earlier age if you have risk factors for colon cancer. On a yearly basis, your health care provider may provide home test kits to check for hidden blood in the stool. A small camera at the end of a tube may be used to directly examine the colon (sigmoidoscopy or colonoscopy) to detect the earliest forms of colorectal cancer. Talk to your health care provider about this at age 8 when routine screening begins. A direct exam of the colon should be repeated every 5-10 years through age 53, unless early forms of precancerous polyps or small growths are found.  People who are at an increased risk for hepatitis B should be screened for this virus. You are considered at high risk for hepatitis B if:  You were born in a country where hepatitis B occurs often. Talk with your health care provider about which countries are considered  high risk.  Your parents were born in a high-risk country and you have not received a shot to protect against hepatitis B (hepatitis B vaccine).  You have HIV or AIDS.  You use needles to inject street drugs.  You live with, or have sex with, someone who has hepatitis B.  You are a man who has sex with other men (MSM).  You get hemodialysis treatment.  You take certain medicines for conditions like cancer, organ transplantation, and autoimmune conditions.  Hepatitis C blood testing is recommended for all people born from [birth year] through 1966 and any individual with known risk factors for hepatitis C.  Healthy men should no longer receive prostate-specific antigen (PSA) blood tests as part of routine cancer screening. Talk to your health care provider about prostate cancer screening.  Testicular cancer screening is not recommended for adolescents or adult  males who have no symptoms. Screening includes self-exam, a health care provider exam, and other screening tests. Consult with your health care provider about any symptoms you have or any concerns you have about testicular cancer.  Practice safe sex. Use condoms and avoid high-risk sexual practices to reduce the spread of sexually transmitted infections (STIs).  You should be screened for STIs, including gonorrhea and chlamydia if:  You are sexually active and are younger than 24 years.  You are older than 24 years, and your health care provider tells you that you are at risk for this type of infection.  Your sexual activity has changed since you were last screened, and you are at an increased risk for chlamydia or gonorrhea. Ask your health care provider if you are at risk.  If you are at risk of being infected with HIV, it is recommended that you take a prescription medicine daily to prevent HIV infection. This is called pre-exposure prophylaxis (PrEP). You are considered at risk if:  You are a man who has sex with other men  (MSM).  You are a heterosexual man who is sexually active with multiple partners.  You take drugs by injection.  You are sexually active with a partner who has HIV.  Talk with your health care provider about whether you are at high risk of being infected with HIV. If you choose to begin PrEP, you should first be tested for HIV. You should then be tested every 3 months for as long as you are taking PrEP.  Use sunscreen. Apply sunscreen liberally and repeatedly throughout the day. You should seek shade when your shadow is shorter than you. Protect yourself by wearing long sleeves, pants, a wide-brimmed hat, and sunglasses year round whenever you are outdoors.  Tell your health care provider of new moles or changes in moles, especially if there is a change in shape or color. Also, tell your health care provider if a mole is larger than the size of a pencil eraser.  A one-time screening for abdominal aortic aneurysm (AAA) and surgical repair of large AAAs by ultrasound is recommended for men aged 29-75 years who are current or former smokers.  Stay current with your vaccines (immunizations).   This information is not intended to replace advice given to you by your health care provider. Make sure you discuss any questions you have with your health care provider.   Document Released: 05/20/2008 Document Revised: 12/13/2014 Document Reviewed: 04/19/2011 Elsevier Interactive Patient Education [employer].

## 2015-09-30 NOTE — Addendum Note (Signed)
Addended by: Marian Sorrow on: 09/30/2015 04:15 PM   Modules accepted: Orders, Medications

## 2015-09-30 NOTE — Progress Notes (Signed)
Pre visit review using our clinic review tool, if applicable. No additional management support is needed unless otherwise documented below in the visit note. 

## 2015-10-07 ENCOUNTER — Encounter: Payer: Self-pay | Admitting: Gastroenterology

## 2015-10-17 ENCOUNTER — Other Ambulatory Visit: Payer: Self-pay | Admitting: Urology

## 2015-10-17 DIAGNOSIS — C61 Malignant neoplasm of prostate: Secondary | ICD-10-CM

## 2015-11-08 ENCOUNTER — Other Ambulatory Visit: Payer: Self-pay | Admitting: Internal Medicine

## 2015-12-11 ENCOUNTER — Ambulatory Visit (AMBULATORY_SURGERY_CENTER): Payer: Self-pay | Admitting: *Deleted

## 2015-12-11 VITALS — Ht 71.0 in | Wt 193.0 lb

## 2015-12-11 DIAGNOSIS — Z86010 Personal history of colonic polyps: Secondary | ICD-10-CM

## 2015-12-11 MED ORDER — NA SULFATE-K SULFATE-MG SULF 17.5-3.13-1.6 GM/177ML PO SOLN: ORAL | Status: DC

## 2015-12-11 NOTE — Progress Notes (Signed)
No allergies to eggs or soy. No problems with anesthesia.  Pt given Emmi instructions for colonoscopy  No oxygen use  No diet drug use  

## 2015-12-21 ENCOUNTER — Encounter: Payer: Self-pay | Admitting: Internal Medicine

## 2015-12-25 ENCOUNTER — Ambulatory Visit (AMBULATORY_SURGERY_CENTER): Payer: BLUE CROSS/BLUE SHIELD | Admitting: Gastroenterology

## 2015-12-25 ENCOUNTER — Encounter: Payer: Self-pay | Admitting: Gastroenterology

## 2015-12-25 VITALS — BP 129/72 | HR 81 | Temp 97.1°F | Resp 19 | Ht 71.0 in | Wt 193.0 lb

## 2015-12-25 DIAGNOSIS — Z86010 Personal history of colonic polyps: Secondary | ICD-10-CM | POA: Diagnosis present

## 2015-12-25 DIAGNOSIS — D123 Benign neoplasm of transverse colon: Secondary | ICD-10-CM | POA: Diagnosis not present

## 2015-12-25 DIAGNOSIS — D125 Benign neoplasm of sigmoid colon: Secondary | ICD-10-CM | POA: Diagnosis not present

## 2015-12-25 MED ORDER — SODIUM CHLORIDE 0.9 % IV SOLN: 500.0000 mL | INTRAVENOUS | Status: DC

## 2015-12-25 NOTE — Patient Instructions (Signed)
Impressions/recommendations:  Polyps (handout given) Hemorrhoids (handout given)  Repeat colonoscopy in 5 years.  YOU HAD AN ENDOSCOPIC PROCEDURE TODAY AT [facility]:   Refer to the procedure report that was given to you for any specific questions about what was found during the examination.  If the procedure report does not answer your questions, please call your gastroenterologist to clarify.  If you requested that your care partner not be given the details of your procedure findings, then the procedure report has been included in a sealed envelope for you to review at your convenience later.  YOU SHOULD EXPECT: Some feelings of bloating in the abdomen. Passage of more gas than usual.  Walking can help get rid of the air that was put into your GI tract during the procedure and reduce the bloating. If you had a lower endoscopy (such as a colonoscopy or flexible sigmoidoscopy) you may notice spotting of blood in your stool or on the toilet paper. If you underwent a bowel prep for your procedure, you may not have a normal bowel movement for a few days.  Please Note:  You might notice some irritation and congestion in your nose or some drainage.  This is from the oxygen used during your procedure.  There is no need for concern and it should clear up in a day or so.  SYMPTOMS TO REPORT IMMEDIATELY:   Following lower endoscopy (colonoscopy or flexible sigmoidoscopy):  Excessive amounts of blood in the stool  Significant tenderness or worsening of abdominal pains  Swelling of the abdomen that is new, acute  Fever of 100F or higher  For urgent or emergent issues, a gastroenterologist can be reached at any hour by calling [phone].   DIET: Your first meal following the procedure should be a small meal and then it is ok to progress to your normal diet. Heavy or fried foods are harder to digest and may make you feel nauseous or bloated.  Likewise, meals heavy in dairy and  vegetables can increase bloating.  Drink plenty of fluids but you should avoid alcoholic beverages for 24 hours.  ACTIVITY:  You should plan to take it easy for the rest of today and you should NOT DRIVE or use heavy machinery until tomorrow (because of the sedation medicines used during the test).    FOLLOW UP: Our staff will call the number listed on your records the next business day following your procedure to check on you and address any questions or concerns that you may have regarding the information given to you following your procedure. If we do not reach you, we will leave a message.  However, if you are feeling well and you are not experiencing any problems, there is no need to return our call.  We will assume that you have returned to your regular daily activities without incident.  If any biopsies were taken you will be contacted by phone or by letter within the next 1-3 weeks.  Please call us at [phone] if you have not heard about the biopsies in 3 weeks.    SIGNATURES/CONFIDENTIALITY: You and/or your care partner have signed paperwork which will be entered into your electronic medical record.  These signatures attest to the fact that that the information above on your After Visit Summary has been reviewed and is understood.  Full responsibility of the confidentiality of this discharge information lies with you and/or your care-partner.

## 2015-12-25 NOTE — Progress Notes (Signed)
Discharge instructions given by N. Megan Salon, LPN.

## 2015-12-25 NOTE — Op Note (Signed)
[facility]  [address] [place], [id number]   COLONOSCOPY PROCEDURE REPORT  PATIENT: Ruben Chavez, Ruben Chavez  MR#: [id number] BIRTHDATE: 07/12/1951 , 44  yrs. old GENDER: male ENDOSCOPIST: Ladene Artist, MD, [facility] PROCEDURE DATE:  12/25/2015 PROCEDURE:   Colonoscopy, surveillance , Colonoscopy with biopsy, and Colonoscopy with snare polypectomy First Screening Colonoscopy - Avg.  risk and is 50 yrs.  old or older - No.  Prior Negative Screening - Now for repeat screening. N/A  History of Adenoma - Now for follow-up colonoscopy & has been > or = to 3 yrs.  Yes hx of adenoma.  Has been 3 or more years since last colonoscopy.  Polyps removed today? Yes ASA CLASS:   Class II INDICATIONS:Surveillance due to prior colonic neoplasia and PH Colon Adenoma. MEDICATIONS: Monitored anesthesia care, Propofol 450 mg IV, and lidocaine 40 mg IV DESCRIPTION OF PROCEDURE:   After the risks benefits and alternatives of the procedure were thoroughly explained, informed consent was obtained.  The digital rectal exam revealed no abnormalities of the rectum.   The LB [id number]  endoscope was introduced through the anus and advanced to the cecum, which was identified by both the appendix and ileocecal valve. No adverse events experienced.   The quality of the prep was adequate (Suprep was used)  The instrument was then slowly withdrawn as the colon was fully examined. Estimated blood loss is zero unless otherwise noted in this procedure report.    COLON FINDINGS: Three sessile polyps measuring 5-6 mm in size were found in the sigmoid colon.  Polypectomies were performed with a cold snare.  The resection was complete, the polyp tissue was completely retrieved and sent to histology.   A sessile polyp measuring 5 mm in size was found in the transverse colon.  A polypectomy was performed with cold forceps.  The resection was complete, the polyp tissue was completely retrieved and  sent to histology.   The examination was otherwise normal.  Retroflexed views revealed internal Grade I hemorrhoids. The time to cecum = 4.6 Withdrawal time = 13.7   The scope was withdrawn and the procedure completed. COMPLICATIONS: There were no immediate complications.  ENDOSCOPIC IMPRESSION: 1.   Three sessile polyps in the sigmoid colon; polypectomies performed with a cold snare 2.   Sessile polyp in the transverse colon; polypectomy performed with cold forceps 3.   Grade l internal hemorrhoids  RECOMMENDATIONS: 1.  Await pathology results 2.  Repeat Colonoscopy in 5 years.  eSigned:  Ladene Artist, MD, [facility] 12/25/2015 10:14 AM

## 2015-12-25 NOTE — Progress Notes (Signed)
Called to room to assist during endoscopic procedure.  Patient ID and intended procedure confirmed with present staff. Received instructions for my participation in the procedure from the performing physician.  

## 2015-12-25 NOTE — Progress Notes (Signed)
Stable to RR 

## 2015-12-26 ENCOUNTER — Telehealth: Payer: Self-pay

## 2015-12-26 NOTE — Telephone Encounter (Signed)
  Follow up Call-  Call back number 12/25/2015  Post procedure Call Back phone  # [phone]  Permission to leave phone message Yes     Patient was called for follow up after his procedure on 12/25/2015. No answer at the number given. A message was left on the answering machine.

## 2015-12-26 NOTE — Telephone Encounter (Signed)
  Follow up Call-  Call back number 12/25/2015  Post procedure Call Back phone  # [phone]  Permission to leave phone message Yes     Patient questions:  Do you have a fever, pain , or abdominal swelling? No. Pain Score  0 *  Have you tolerated food without any problems? Yes.    Have you been able to return to your normal activities? Yes.    Do you have any questions about your discharge instructions: Diet   No. Medications  No. Follow up visit  No.  Do you have questions or concerns about your Care? No.  Actions: * If pain score is 4 or above: No action needed, pain <4.

## 2016-01-05 ENCOUNTER — Telehealth: Payer: Self-pay | Admitting: Gastroenterology

## 2016-01-05 NOTE — Telephone Encounter (Signed)
Patient advised that pathology is on Dr. Lynne Leader desk for review and that he will send a letter or we will call with the results.

## 2016-01-06 ENCOUNTER — Encounter: Payer: Self-pay | Admitting: Gastroenterology

## 2016-01-14 ENCOUNTER — Ambulatory Visit (HOSPITAL_COMMUNITY)
Admission: RE | Admit: 2016-01-14 | Discharge: 2016-01-14 | Disposition: A | Discharge status: Home / Self Care | Payer: BLUE CROSS/BLUE SHIELD | Source: Ambulatory Visit | Attending: Urology | Admitting: Urology

## 2016-01-14 DIAGNOSIS — C61 Malignant neoplasm of prostate: Secondary | ICD-10-CM | POA: Insufficient documentation

## 2016-01-14 DIAGNOSIS — N400 Enlarged prostate without lower urinary tract symptoms: Secondary | ICD-10-CM | POA: Insufficient documentation

## 2016-01-14 LAB — POCT I-STAT CREATININE: Creatinine, Ser: 0.9 mg/dL (ref 0.61–1.24)

## 2016-01-14 MED ORDER — GADOBENATE DIMEGLUMINE 529 MG/ML IV SOLN
20.0000 mL | Freq: Once | INTRAVENOUS | Status: AC | PRN
  Administered 2016-01-14: 17 mL via INTRAVENOUS

## 2016-01-28 HISTORY — PX: PROSTATE BIOPSY: SHX241

## 2016-02-24 ENCOUNTER — Telehealth: Payer: Self-pay | Admitting: Internal Medicine

## 2016-02-24 NOTE — Telephone Encounter (Signed)
Patient Name: Ruben Chavez DOB: [birth year] Initial Comment Caller states has severe cramping in fingers, feet and calves, couple of times per day; thinks electrolyte imbalance; worse driving long distances and at night; worse after hot tub few nights ago; for about 10 days; Nurse Assessment Nurse: Marcelline Deist, RN, Kermit Balo Date/Time (Eastern Time): 02/24/2016 11:28:54 AM Confirm and document reason for call. If symptomatic, describe symptoms. You must click the next button to save text entered. ---Caller states has severe cramping in fingers, feet and calves, couple of times per day. Thinks it could be an electrolyte imbalance. Has been on BP rx for a year 1/2. Seems to be worse driving long distances and at night. It was worse after hot tub few nights ago, for about 10 days. This lasts a few seconds to a minute or so, worse with fingers as he is using them. No visible symptoms. Has the patient traveled out of the country within the last 30 days? ---Not Applicable Does the patient have any new or worsening symptoms? ---Yes Will a triage be completed? ---Yes Related visit to physician within the last 2 weeks? ---No Does the PT have any chronic conditions? (i.e. diabetes, asthma, etc.) ---Yes List chronic conditions. ---on Bp rx, prostate cancer - low-grade Is this a behavioral health or substance abuse call? ---No Guidelines Guideline Title Affirmed Question Affirmed Notes Leg Pain [1] MODERATE pain (e.g., interferes with normal activities, limping) AND [2] present > 3 days Final Disposition User See PCP When Office is Open (within 3 days) Marcelline Deist, RN, Kermit Balo Comments Caller became very irritated while nurse was attempting to schedule patient. He was upset that there were no appointments available with his Dr. this week at a specific time, and not sure about seeing a NP. Scheduled next week with his Dr. at the time he wanted, but if there are cancellations in the meantime, to  contact him at this #. Referrals REFERRED TO PCP OFFICE Disagree/Comply: Comply

## 2016-02-24 NOTE — Telephone Encounter (Signed)
Noted  

## 2016-03-03 ENCOUNTER — Encounter: Payer: Self-pay | Admitting: Internal Medicine

## 2016-03-03 ENCOUNTER — Ambulatory Visit (INDEPENDENT_AMBULATORY_CARE_PROVIDER_SITE_OTHER): Payer: BLUE CROSS/BLUE SHIELD | Admitting: Internal Medicine

## 2016-03-03 VITALS — BP 124/62 | HR 71 | Temp 98.0°F | Resp 20 | Ht 71.0 in | Wt 187.0 lb

## 2016-03-03 DIAGNOSIS — I10 Essential (primary) hypertension: Secondary | ICD-10-CM

## 2016-03-03 DIAGNOSIS — C61 Malignant neoplasm of prostate: Secondary | ICD-10-CM | POA: Diagnosis not present

## 2016-03-03 DIAGNOSIS — E785 Hyperlipidemia, unspecified: Secondary | ICD-10-CM | POA: Diagnosis not present

## 2016-03-03 NOTE — Progress Notes (Signed)
Pre visit review using our clinic review tool, if applicable. No additional management support is needed unless otherwise documented below in the visit note. 

## 2016-03-04 ENCOUNTER — Encounter: Payer: Self-pay | Admitting: Internal Medicine

## 2016-03-04 DIAGNOSIS — C61 Malignant neoplasm of prostate: Secondary | ICD-10-CM | POA: Insufficient documentation

## 2016-03-04 NOTE — Patient Instructions (Signed)
Limit your sodium (Salt) intake   Please check your blood pressure on a regular basis.  If it is consistently greater than 150/90, please make an office appointment.  Followup Radiation Oncology  Return in 6 months for follow-up   Call or return to clinic prn if these symptoms worsen or fail to improve as anticipated.

## 2016-03-04 NOTE — Progress Notes (Signed)
Subjective:    Patient ID: Ruben Chavez, male    DOB: [birth year], 65 y.o.   MRN: [id number]  HPI 65 y/o with long h/o HTN.  Last week, had cramping that has resolved.  Has treated HTN which includes HCTZ- on K+ supplement.  Recent f/u prostate bx which reveales Gleason score of 3+4 (incr from 6).  Has been referred by Urol to Rad Onc.  Considerable discussion about options for treating and ,onitoring prostate Ca.  Past Medical History  Diagnosis Date  . Hyperlipidemia     pt says not anymore  . Elevated PSA   . Prostate cancer ([place]) 2013  . Asthma   . Hypertension     Social History   Social History  . Marital Status: Married    Spouse Name: N/A  . Number of Children: N/A  . Years of Education: N/A   Occupational History  . Not on file.   Social History Main Topics  . Smoking status: Former Smoker    Quit date: 06/06/1999  . Smokeless tobacco: Never Used  . Alcohol Use: 8.4 oz/week    14 Shots of liquor per week  . Drug Use: No  . Sexual Activity: Yes   Other Topics Concern  . Not on file   Social History Narrative    Past Surgical History  Procedure Laterality Date  . Total hip arthroplasty  2001    left  . Knee arthroscopy  2007    right  . Bilateral inguinal hernia  2008  . Cyst removal trunk  2016    sebaceous cyst    Family History  Problem Relation Age of Onset  . Colon cancer Neg Hx   . Pancreatic cancer Mother   . Heart disease Father     Allergies  Allergen Reactions  . Povidone-Iodine     REACTION: rash    Current Outpatient Prescriptions on File Prior to Visit  Medication Sig Dispense Refill  . aspirin 81 MG tablet Take 81 mg by mouth daily.    Marland Kitchen HYDROcodone-acetaminophen (NORCO/VICODIN) 5-325 MG tablet Take 1 tablet by mouth every 6 (six) hours as needed for moderate pain. 60 tablet 0  . Krill Oil 300 MG CAPS Take 1 capsule by mouth daily.    Marland Kitchen lisinopril-hydrochlorothiazide (PRINZIDE,ZESTORETIC) 20-25 MG tablet Take 1 tablet  by mouth daily. 90 tablet 3  . multivitamin (THERAGRAN) per tablet Take 1 tablet by mouth daily.      . naproxen sodium (ANAPROX) 220 MG tablet Take 220 mg by mouth daily.    . Potassium Gluconate 550 MG TABS Take 1 tablet by mouth daily.    . sildenafil (VIAGRA) 100 MG tablet Take 100 mg by mouth daily as needed.       No current facility-administered medications on file prior to visit.    BP 124/62 mmHg  Pulse 71  Temp(Src) 98 F (36.7 C) (Oral)  Resp 20  Ht 5\' 11"  (1.803 m)  Wt 187 lb (84.823 kg)  BMI 26.09 kg/m2  SpO2 97%     Review of Systems  Musculoskeletal:       Cramping resolved       Objective:   Physical Exam  Constitutional:  BP 124/64  Vitals reviewed. ; f/u Rad Onc        Assessment & Plan:   HTN- well controlled; no change in Rx at this time but will consider d/c of HCTZ if cramping more of a chr problem; cont. K+ supplement. Prostate  ca- discussed at length

## 2016-03-12 ENCOUNTER — Encounter: Payer: Self-pay | Admitting: Radiation Oncology

## 2016-03-12 NOTE — Progress Notes (Signed)
GU Location of Tumor / Histology: prostatic adenocarcinoma  If Prostate Cancer, Gleason Score is (3 + 4) and PSA is (7.9)  Ruben Chavez has been under active surveillance since June 2008.  Third biopsy done 0000000 (if applicable) revealed:    Past/Anticipated interventions by urology, if any: prostate biopsy x 3, active surveillance, referral to radiation oncology  Past/Anticipated interventions by medical oncology, if any: no  Weight changes, if any: no  Bowel/Bladder complaints, if any:  Urgency on occasion, nocturia x 1. Denies incontinence, leakage, hematuria, or dysuria.   Nausea/Vomiting, if any: no  Pain issues, if any:  no  SAFETY ISSUES:  Prior radiation? no  Pacemaker/ICD? no  Possible current pregnancy? no  Is the patient on methotrexate? no  Current Complaints / other details:  65 year old male. Mother with hx of cancer. Prostatic volume was 57.01 cc on 04/26/2016. Has two sons and one daughter.

## 2016-03-15 ENCOUNTER — Encounter: Payer: Self-pay | Admitting: Radiation Oncology

## 2016-03-15 ENCOUNTER — Ambulatory Visit
Admission: RE | Admit: 2016-03-15 | Discharge: 2016-03-15 | Disposition: A | Discharge status: Home / Self Care | Payer: BLUE CROSS/BLUE SHIELD | Source: Ambulatory Visit | Attending: Radiation Oncology | Admitting: Radiation Oncology

## 2016-03-15 VITALS — BP 148/58 | HR 67 | Resp 16 | Wt 188.0 lb

## 2016-03-15 DIAGNOSIS — Z510 Encounter for antineoplastic radiation therapy: Secondary | ICD-10-CM | POA: Insufficient documentation

## 2016-03-15 DIAGNOSIS — J45909 Unspecified asthma, uncomplicated: Secondary | ICD-10-CM | POA: Insufficient documentation

## 2016-03-15 DIAGNOSIS — E785 Hyperlipidemia, unspecified: Secondary | ICD-10-CM | POA: Diagnosis not present

## 2016-03-15 DIAGNOSIS — C61 Malignant neoplasm of prostate: Secondary | ICD-10-CM | POA: Diagnosis not present

## 2016-03-15 DIAGNOSIS — I10 Essential (primary) hypertension: Secondary | ICD-10-CM | POA: Diagnosis not present

## 2016-03-15 NOTE — Progress Notes (Signed)
Radiation Oncology         [phone] ________________________________  Initial outpatient Consultation  Name: Ruben Chavez MRN: [id number]  Date: 03/15/2016  DOB: 1951/08/16  VK:[id number] Pilar Plate, MD  [name], MD   REFERRING PHYSICIAN: [name], MD  DIAGNOSIS: 65 y.o. gentleman with stage T1c adenocarcinoma of the prostate with a Gleason's score of 3+4 and a PSA of 7.9    ICD-9-CM ICD-10-CM   1. Malignant neoplasm of prostate ([name]) [name] is a 65 y.o. gentleman.  He has been under active surveillance since June 2008.  He was noted to have an elevated PSA of 7.91 by his primary care physician, Dr. Burnice Logan.  The patient proceeded to MRI of the prostate on 01/14/16 revealing a more focal area of T2 hypointensity and restricted diffusion within the right anterior paracentral mid gland was noted though indeterminate. No locally advanced disease was seen. A transrectal ultrasound with 12 biopsies of the prostate on 01/28/16.  The prostate volume measured 57.01 cc.  Out of 12 core biopsies, 3 were positive.  The maximum Gleason score was 3+4, and this was seen in the left lateral apex.  The patient reviewed the biopsy results with his urologist and he has kindly been referred today for discussion of potential radiation treatment options.      PREVIOUS RADIATION THERAPY: No  PAST MEDICAL HISTORY:  Past Medical History  Diagnosis Date  . Hyperlipidemia     pt says not anymore  . Elevated PSA   . Prostate cancer ([place]) 2013  . Asthma   . Hypertension       PAST SURGICAL HISTORY: Past Surgical History  Procedure Laterality Date  . Total hip arthroplasty  2001    left  . Knee arthroscopy  2007    right  . Bilateral inguinal hernia  2008  . Cyst removal trunk  2016    sebaceous cyst  . Prostate biopsy  2008  . Prostate biopsy  2013  . Prostate biopsy  01/28/2016    FAMILY HISTORY:  Family  History  Problem Relation Age of Onset  . Colon cancer Neg Hx   . Pancreatic cancer Mother   . Heart disease Father     SOCIAL HISTORY:  reports that he quit smoking about 16 years ago. His smoking use included Cigarettes. He has a 32 pack-year smoking history. He has never used smokeless tobacco. He reports that he drinks about 8.4 oz of alcohol per week. He reports that he does not use illicit drugs. He is single and works in [employer]. 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 He lives in [place].  ALLERGIES: Povidone-iodine  MEDICATIONS:  Current Outpatient Prescriptions  Medication Sig Dispense Refill  . aspirin 81 MG tablet Take 81 mg by mouth daily.    Marland Kitchen HYDROcodone-acetaminophen (NORCO/VICODIN) 5-325 MG tablet Take 1 tablet by mouth every 6 (six) hours as needed for moderate pain. 60 tablet 0  . Krill Oil 300 MG CAPS Take 1 capsule by mouth daily.    Marland Kitchen lisinopril-hydrochlorothiazide (PRINZIDE,ZESTORETIC) 20-25 MG tablet Take 1 tablet by mouth daily. 90 tablet 3  . multivitamin (THERAGRAN) per tablet Take 1 tablet by mouth daily.      . naproxen sodium (ANAPROX) 220 MG tablet Take 220 mg by mouth daily.    . Potassium Gluconate 550 MG TABS Take 1 tablet by mouth daily.    . sildenafil (VIAGRA)  100 MG tablet Take 100 mg by mouth daily as needed.       No current facility-administered medications for this encounter.    REVIEW OF SYSTEMS:  On review of systems, the patient reports that he is doing well overall. He denies any chest pain, shortness of breath, cough, fevers, chills, night sweats, unintended weight changes. He denies any bowel or bladder disturbances, and denies abdominal pain, nausea or vomiting. He denies any new musculoskeletal or joint aches or pains. The patient completed an IPSS and IIEF questionnaire.  His IPSS score was 2 indicating mild urinary outflow obstructive symptoms including nocturia x 1 and occasional urgency.  He indicated that his  erectile function is able to complete sexual activity with 1/2 Viagra tablet. He reports he would like to schedule treatment as soon as possible, and that he's met his deductible for the year. His daughter is also getting married in May and he needs to be able to travel to [place] for this. A complete review of systems is obtained and is otherwise negative.   PHYSICAL EXAM:    weight is 188 lb (85.276 kg). His blood pressure is 148/58 and his pulse is 67. His respiration is 16 and oxygen saturation is 100%.   Pain scale 0/10 In general this is a well appearing Caucasian male  in no acute distress. He is alert and oriented x4 and appropriate throughout the examination. HEENT reveals that the patient is normocephalic, atraumatic. EOMs are intact. PERRLA. Skin is intact without any evidence of gross lesions. Cardiovascular exam reveals a regular rate and rhythm, no clicks rubs or murmurs are auscultated. Chest is clear to auscultation bilaterally. Lymphatic assessment is performed and does not reveal any adenopathy in the cervical, supraclavicular, axillary, or inguinal chains. Abdomen has active bowel sounds in all quadrants and is intact. The abdomen is soft, non tender, non distended. Lower extremities are negative for pretibial pitting edema, deep calf tenderness, cyanosis or clubbing.  KPS = 100  100 - Normal; no complaints; no evidence of disease. 90   - Able to carry on normal activity; minor signs or symptoms of disease. 80   - Normal activity with effort; some signs or symptoms of disease. 57   - Cares for self; unable to carry on normal activity or to do active work. 60   - Requires occasional assistance, but is able to care for most of his personal needs. 50   - Requires considerable assistance and frequent medical care. 68   - Disabled; requires special care and assistance. 72   - Severely disabled; hospital admission is indicated although death not imminent. 70   - Very sick; hospital  admission necessary; active supportive treatment necessary. 10   - Moribund; fatal processes progressing rapidly. 0     - Dead  Karnofsky DA, Abelmann WH, Craver LS and Burchenal [facility] [phone]) The use of the nitrogen mustards in the palliative treatment of carcinoma: with particular reference to bronchogenic carcinoma Cancer 1 [phone]     IMPRESSION:  65 year-old male with stage T1c adenocarcinoma of the prostate with a Gleason's score of 3+4 and a PSA of 7.9.  His T-Stage, Gleason's Score, and PSA put him into the intermediate risk group.  Accordingly he is eligible for a variety of potential treatment options including radiation treatment, prostate brachytherapy, and radical prostatectomy.  PLAN: Dr. Tammi Klippel reviewed the findings and workup thus far.  We discussed the natural history of prostate cancer.  We reviewed the the implications  of T-stage, Gleason's Score, and PSA on decision-making and outcomes in prostate cancer.  We discussed radiation treatment in the management of prostate cancer with regard to the logistics and delivery of external beam radiation treatment as well as the logistics and delivery of prostate brachytherapy.  We compared and contrasted each of these approaches and also compared these against prostatectomy.  The patient expressed interest in prostate brachytherapy. The procedure was outlined and the risks, benefits, long and short term side effects of each were detailed.  The patient would like to proceed with prostate brachytherapy.  We  will share our discussion with Dr. Diona Fanti and move forward with scheduling the procedure in the near future.  We enjoyed meeting with him today, and will look forward to participating in the care of this very nice gentleman.   The above documentation reflects my direct findings during this shared patient visit. Please see the separate note by Dr. Tammi Klippel on this date for the remainder of the patient's plan of care.    Carola Rhine,  PAC     This document serves as a record of services personally performed by Shona Simpson, PAC and  Tyler Pita, MD. It was created on their behalf by Arlyce Harman, a trained medical scribe. The creation of this record is based on the scribe's personal observations and the provider's statements to them. This document has been checked and approved by the attending provider.

## 2016-03-15 NOTE — Progress Notes (Signed)
See progress note under physician encounter. 

## 2016-03-16 ENCOUNTER — Telehealth: Payer: Self-pay | Admitting: *Deleted

## 2016-03-16 NOTE — Telephone Encounter (Signed)
Called patient to inform of pre-seed planning CT, spoke with patient and he is aware of this appt.

## 2016-03-17 ENCOUNTER — Other Ambulatory Visit: Payer: Self-pay | Admitting: Urology

## 2016-03-18 ENCOUNTER — Ambulatory Visit (HOSPITAL_BASED_OUTPATIENT_CLINIC_OR_DEPARTMENT_OTHER)
Admission: RE | Admit: 2016-03-18 | Discharge: 2016-03-18 | Disposition: A | Discharge status: Home / Self Care | Payer: BLUE CROSS/BLUE SHIELD | Source: Ambulatory Visit | Attending: Radiation Oncology | Admitting: Radiation Oncology

## 2016-03-18 ENCOUNTER — Other Ambulatory Visit: Payer: Self-pay

## 2016-03-18 ENCOUNTER — Encounter: Payer: Self-pay | Admitting: Radiation Oncology

## 2016-03-18 ENCOUNTER — Ambulatory Visit (HOSPITAL_COMMUNITY)
Admission: RE | Admit: 2016-03-18 | Discharge: 2016-03-18 | Disposition: A | Discharge status: Home / Self Care | Payer: BLUE CROSS/BLUE SHIELD | Source: Ambulatory Visit | Attending: Urology | Admitting: Urology

## 2016-03-18 ENCOUNTER — Encounter (HOSPITAL_BASED_OUTPATIENT_CLINIC_OR_DEPARTMENT_OTHER)
Admission: RE | Admit: 2016-03-18 | Discharge: 2016-03-18 | Disposition: A | Discharge status: Home / Self Care | Payer: BLUE CROSS/BLUE SHIELD | Source: Ambulatory Visit | Attending: Urology | Admitting: Urology

## 2016-03-18 ENCOUNTER — Ambulatory Visit
Admission: RE | Admit: 2016-03-18 | Discharge: 2016-03-18 | Disposition: A | Discharge status: Home / Self Care | Payer: BLUE CROSS/BLUE SHIELD | Source: Ambulatory Visit | Attending: Radiation Oncology | Admitting: Radiation Oncology

## 2016-03-18 VITALS — BP 130/63 | HR 63 | Temp 98.0°F | Ht 71.0 in | Wt 187.0 lb

## 2016-03-18 DIAGNOSIS — C61 Malignant neoplasm of prostate: Secondary | ICD-10-CM

## 2016-03-18 DIAGNOSIS — I10 Essential (primary) hypertension: Secondary | ICD-10-CM | POA: Diagnosis not present

## 2016-03-18 DIAGNOSIS — E785 Hyperlipidemia, unspecified: Secondary | ICD-10-CM | POA: Diagnosis not present

## 2016-03-18 DIAGNOSIS — J45909 Unspecified asthma, uncomplicated: Secondary | ICD-10-CM | POA: Diagnosis not present

## 2016-03-18 DIAGNOSIS — Z01810 Encounter for preprocedural cardiovascular examination: Secondary | ICD-10-CM | POA: Insufficient documentation

## 2016-03-18 DIAGNOSIS — Z510 Encounter for antineoplastic radiation therapy: Secondary | ICD-10-CM | POA: Diagnosis not present

## 2016-03-18 NOTE — Progress Notes (Signed)
  Radiation Oncology         [phone] ________________________________  Name: Ruben Chavez MRN: [id number]  Date: 03/18/2016  DOB: 02/18/1951  SIMULATION AND TREATMENT PLANNING NOTE PUBIC ARCH STUDY  [id number] Pilar Plate, MD  [name], MD  DIAGNOSIS: 65 year-old male with stage T1c adenocarcinoma of the prostate with a Gleason's score of 3+4 and a PSA of 7.9     ICD-9-CM ICD-10-CM   1. Prostate cancer ([place]) [name]:  The patient presented today for evaluation for possible prostate seed implant. He was brought to the radiation planning suite and placed supine on the CT couch. A 3-dimensional image study set was obtained in upload to the planning computer. There, on each axial slice, I contoured the prostate gland. Then, using three-dimensional radiation planning tools I reconstructed the prostate in view of the structures from the transperineal needle pathway to assess for possible pubic arch interference. In doing so, I did not appreciate any pubic arch interference. Also, the patient's prostate volume was estimated based on the drawn structure. The volume was 63 cc correlating well with his TRUS volume of 57 cc.  Given the pubic arch appearance and prostate volume, patient remains a good candidate to proceed with prostate seed implant. Today, he freely provided informed written consent to proceed.    PLAN: The patient will undergo prostate seed implant.   ________________________________  Sheral Apley. Tammi Klippel, M.D.

## 2016-03-18 NOTE — Progress Notes (Signed)
  Radiation Oncology         [phone] ________________________________  Name: Ruben Chavez MRN: [id number]  Date: 03/18/2016  DOB: [birth year]  SIMULATION AND TREATMENT PLANNING NOTE PUBIC ARCH STUDY  [id number] Pilar Plate, MD  [name], MD  DIAGNOSIS: 65 y.o. gentleman with stage T1c adenocarcinoma of the prostate with a Gleason's score of 3+4 and a PSA of 7.9  Mr. Butron comes today in preparation for proceeding with a radioactive seed implant with Dr. Tammi Klippel. This is tentatively scheduled for 04/22/2016. He is planning to meet with anesthesia today to have a preoperative chest x-ray and EKG. He has gone to simulation, and Dr. Tammi Klippel have also reviewed the images and contour his prostate by CT. He has an appropriate study, and the prostate is acceptable and obscured by the pubic rami.    ICD-9-CM ICD-10-CM   1. Prostate cancer ([place]) 185 C61     COMPLEX SIMULATION:  See the separately dictated note and imaging in Dr. Johny Shears follow-up note.   PLAN: The patient is counseled on the rationale for proceeding with radioactive seed implant. He is scheduled already as above, and we will forward with this., Benefits, short and long-term side effects of this procedure were detailed with the patient as well as perioperative expectations. Date agreement and understanding and is encouraged to call if you've any questions or concerns prior to this procedure.     Carola Rhine, PAC,

## 2016-04-14 ENCOUNTER — Telehealth: Payer: Self-pay | Admitting: *Deleted

## 2016-04-14 ENCOUNTER — Encounter (HOSPITAL_BASED_OUTPATIENT_CLINIC_OR_DEPARTMENT_OTHER): Payer: Self-pay | Admitting: *Deleted

## 2016-04-14 NOTE — Telephone Encounter (Signed)
CALLED PATIENT TO REMIND OF LABS FOR IMPLANT, SPOKE WITH PATIENT AND HE IS AWARE OF THIS APPT. 

## 2016-04-14 NOTE — Progress Notes (Signed)
To [facility] at 1300 -Ekg,Cxr with chart,labs to be drawn 04/15/16 -instructed Npo after Mn-solids,clear liquids( no dairy,pulp) until 0700,then Npo.To complete fleet enema prior to arrival.

## 2016-04-15 ENCOUNTER — Ambulatory Visit: Admission: RE | Admit: 2016-04-15 | Payer: BLUE CROSS/BLUE SHIELD | Source: Ambulatory Visit

## 2016-04-15 DIAGNOSIS — Z87891 Personal history of nicotine dependence: Secondary | ICD-10-CM | POA: Diagnosis not present

## 2016-04-15 DIAGNOSIS — C61 Malignant neoplasm of prostate: Secondary | ICD-10-CM | POA: Diagnosis not present

## 2016-04-15 DIAGNOSIS — I10 Essential (primary) hypertension: Secondary | ICD-10-CM | POA: Diagnosis not present

## 2016-04-15 DIAGNOSIS — Z96642 Presence of left artificial hip joint: Secondary | ICD-10-CM | POA: Diagnosis not present

## 2016-04-15 LAB — COMPREHENSIVE METABOLIC PANEL
ALT: 32 U/L (ref 17–63)
AST: 29 U/L (ref 15–41)
Albumin: 4.4 g/dL (ref 3.5–5.0)
Alkaline Phosphatase: 41 U/L (ref 38–126)
Anion gap: 9 (ref 5–15)
BUN: 28 mg/dL — AB (ref 6–20)
CHLORIDE: 105 mmol/L (ref 101–111)
CO2: 28 mmol/L (ref 22–32)
Calcium: 9.7 mg/dL (ref 8.9–10.3)
Creatinine, Ser: 0.99 mg/dL (ref 0.61–1.24)
GFR calc Af Amer: >60 mL/min (ref >60)
GFR calc non Af Amer: >60 mL/min (ref >60)
GLUCOSE: 113 mg/dL — AB (ref 65–99)
Potassium: 3.8 mmol/L (ref 3.5–5.1)
SODIUM: 142 mmol/L (ref 135–145)
Total Bilirubin: 0.9 mg/dL (ref 0.3–1.2)
Total Protein: 7.2 g/dL (ref 6.5–8.1)

## 2016-04-15 LAB — CBC
HCT: 41.2 % (ref 39.0–52.0)
Hemoglobin: 14 g/dL (ref 13.0–17.0)
MCH: 32 pg (ref 26.0–34.0)
MCHC: 34 g/dL (ref 30.0–36.0)
MCV: 94.3 fL (ref 78.0–100.0)
PLATELETS: 227 10*3/uL (ref 150–400)
RBC: 4.37 MIL/uL (ref 4.22–5.81)
RDW: 13.3 % (ref 11.5–15.5)
WBC: 5.9 10*3/uL (ref 4.0–10.5)

## 2016-04-15 LAB — PROTIME-INR
INR: 1 (ref 0.00–1.49)
Prothrombin Time: 13.4 seconds (ref 11.6–15.2)

## 2016-04-15 LAB — APTT: aPTT: 29 seconds (ref 24–37)

## 2016-04-16 DIAGNOSIS — C61 Malignant neoplasm of prostate: Secondary | ICD-10-CM | POA: Diagnosis not present

## 2016-04-21 ENCOUNTER — Telehealth: Payer: Self-pay | Admitting: *Deleted

## 2016-04-21 NOTE — Telephone Encounter (Signed)
CALLED PATIENT TO REMIND OF IMPLANT FOR 04/22/16, SPOKE WITH PATIENT AND HE IS AWARE OF THIS IMPLANT

## 2016-04-22 ENCOUNTER — Ambulatory Visit (HOSPITAL_BASED_OUTPATIENT_CLINIC_OR_DEPARTMENT_OTHER)
Admission: RE | Admit: 2016-04-22 | Discharge: 2016-04-22 | Disposition: A | Discharge status: Home / Self Care | Payer: BLUE CROSS/BLUE SHIELD | Source: Ambulatory Visit | Attending: Urology | Admitting: Urology

## 2016-04-22 ENCOUNTER — Ambulatory Visit (HOSPITAL_BASED_OUTPATIENT_CLINIC_OR_DEPARTMENT_OTHER): Payer: BLUE CROSS/BLUE SHIELD | Admitting: Certified Registered"

## 2016-04-22 ENCOUNTER — Ambulatory Visit (HOSPITAL_COMMUNITY): Payer: BLUE CROSS/BLUE SHIELD

## 2016-04-22 ENCOUNTER — Encounter (HOSPITAL_BASED_OUTPATIENT_CLINIC_OR_DEPARTMENT_OTHER)
Admission: RE | Disposition: A | Discharge status: Home / Self Care | Payer: Self-pay | Source: Ambulatory Visit | Attending: Urology

## 2016-04-22 ENCOUNTER — Encounter (HOSPITAL_BASED_OUTPATIENT_CLINIC_OR_DEPARTMENT_OTHER): Payer: Self-pay | Admitting: *Deleted

## 2016-04-22 DIAGNOSIS — I10 Essential (primary) hypertension: Secondary | ICD-10-CM | POA: Diagnosis not present

## 2016-04-22 DIAGNOSIS — Z96642 Presence of left artificial hip joint: Secondary | ICD-10-CM | POA: Insufficient documentation

## 2016-04-22 DIAGNOSIS — Z01818 Encounter for other preprocedural examination: Secondary | ICD-10-CM

## 2016-04-22 DIAGNOSIS — Z87891 Personal history of nicotine dependence: Secondary | ICD-10-CM | POA: Diagnosis not present

## 2016-04-22 DIAGNOSIS — C61 Malignant neoplasm of prostate: Secondary | ICD-10-CM | POA: Insufficient documentation

## 2016-04-22 HISTORY — PX: RADIOACTIVE SEED IMPLANT: SHX5150

## 2016-04-22 HISTORY — PX: CYSTOSCOPY: SHX5120

## 2016-04-22 SURGERY — INSERTION, RADIATION SOURCE, PROSTATE
Anesthesia: General

## 2016-04-22 MED ORDER — HYDROMORPHONE HCL 1 MG/ML IJ SOLN
0.2500 mg | INTRAMUSCULAR | Status: DC | PRN
  Administered 2016-04-22 (×2): 0.5 mg via INTRAVENOUS
  Filled 2016-04-22: qty 1

## 2016-04-22 MED ORDER — CIPROFLOXACIN IN D5W 400 MG/200ML IV SOLN
400.0000 mg | INTRAVENOUS | Status: AC
  Administered 2016-04-22: 400 mg via INTRAVENOUS
  Filled 2016-04-22: qty 200

## 2016-04-22 MED ORDER — MIDAZOLAM HCL 5 MG/5ML IJ SOLN
INTRAMUSCULAR | Status: DC | PRN
  Administered 2016-04-22: 2 mg via INTRAVENOUS

## 2016-04-22 MED ORDER — FENTANYL CITRATE (PF) 100 MCG/2ML IJ SOLN
INTRAMUSCULAR | Status: AC
  Filled 2016-04-22: qty 2

## 2016-04-22 MED ORDER — FENTANYL CITRATE (PF) 100 MCG/2ML IJ SOLN
INTRAMUSCULAR | Status: DC | PRN
  Administered 2016-04-22 (×4): 25 ug via INTRAVENOUS
  Administered 2016-04-22 (×2): 50 ug via INTRAVENOUS

## 2016-04-22 MED ORDER — DIATRIZOATE MEGLUMINE 30 % UR SOLN
URETHRAL | Status: DC | PRN
  Administered 2016-04-22: 7 mL via URETHRAL

## 2016-04-22 MED ORDER — DEXAMETHASONE SODIUM PHOSPHATE 4 MG/ML IJ SOLN
INTRAMUSCULAR | Status: DC | PRN
  Administered 2016-04-22: 10 mg via INTRAVENOUS

## 2016-04-22 MED ORDER — PROMETHAZINE HCL 25 MG/ML IJ SOLN
6.2500 mg | INTRAMUSCULAR | Status: DC | PRN
  Filled 2016-04-22: qty 1

## 2016-04-22 MED ORDER — ONDANSETRON HCL 4 MG/2ML IJ SOLN
INTRAMUSCULAR | Status: DC | PRN
  Administered 2016-04-22: 4 mg via INTRAVENOUS

## 2016-04-22 MED ORDER — METHOCARBAMOL 500 MG PO TABS
500.0000 mg | ORAL_TABLET | Freq: Once | ORAL | Status: AC
  Administered 2016-04-22: 500 mg via ORAL
  Filled 2016-04-22: qty 1

## 2016-04-22 MED ORDER — LACTATED RINGERS IV SOLN
INTRAVENOUS | Status: DC
  Administered 2016-04-22: 14:00:00 via INTRAVENOUS
  Filled 2016-04-22: qty 1000

## 2016-04-22 MED ORDER — LIDOCAINE 2% (20 MG/ML) 5 ML SYRINGE
INTRAMUSCULAR | Status: DC | PRN
  Administered 2016-04-22: 80 mg via INTRAVENOUS

## 2016-04-22 MED ORDER — FLEET ENEMA 7-19 GM/118ML RE ENEM
1.0000 | ENEMA | Freq: Once | RECTAL | Status: AC
  Administered 2016-04-22: 1 via RECTAL
  Filled 2016-04-22: qty 1

## 2016-04-22 MED ORDER — CEPHALEXIN 500 MG PO CAPS: 500.0000 mg | ORAL_CAPSULE | Freq: Two times a day (BID) | ORAL | Status: DC

## 2016-04-22 MED ORDER — METHOCARBAMOL 500 MG PO TABS
ORAL_TABLET | ORAL | Status: AC
  Filled 2016-04-22: qty 1

## 2016-04-22 MED ORDER — PROPOFOL 10 MG/ML IV BOLUS
INTRAVENOUS | Status: DC | PRN
  Administered 2016-04-22: 200 mg via INTRAVENOUS

## 2016-04-22 MED ORDER — MIDAZOLAM HCL 2 MG/2ML IJ SOLN
INTRAMUSCULAR | Status: AC
  Filled 2016-04-22: qty 2

## 2016-04-22 MED ORDER — HYDROMORPHONE HCL 1 MG/ML IJ SOLN
INTRAMUSCULAR | Status: AC
  Filled 2016-04-22: qty 1

## 2016-04-22 MED ORDER — SODIUM CHLORIDE 0.9 % IR SOLN
Status: DC | PRN
  Administered 2016-04-22: 1000 mL via INTRAVESICAL

## 2016-04-22 MED ORDER — TRAMADOL HCL 50 MG PO TABS: 50.0000 mg | ORAL_TABLET | Freq: Four times a day (QID) | ORAL | Status: DC | PRN

## 2016-04-22 MED ORDER — PROPOFOL 500 MG/50ML IV EMUL
INTRAVENOUS | Status: DC | PRN
  Administered 2016-04-22: 25 ug/kg/min via INTRAVENOUS

## 2016-04-22 MED ORDER — PROPOFOL 10 MG/ML IV BOLUS
INTRAVENOUS | Status: AC
  Filled 2016-04-22: qty 20

## 2016-04-22 MED ORDER — CIPROFLOXACIN IN D5W 400 MG/200ML IV SOLN
INTRAVENOUS | Status: AC
  Filled 2016-04-22: qty 200

## 2016-04-22 MED ORDER — STERILE WATER FOR IRRIGATION IR SOLN
Status: DC | PRN
  Administered 2016-04-22: 500 mL

## 2016-04-22 SURGICAL SUPPLY — 27 items
BAG URINE DRAINAGE (UROLOGICAL SUPPLIES) ×3 IMPLANT
BLADE CLIPPER SURG (BLADE) ×3 IMPLANT
CATH FOLEY 2WAY SLVR  5CC 16FR (CATHETERS) ×1
CATH FOLEY 2WAY SLVR 5CC 16FR (CATHETERS) ×2 IMPLANT
CATH ROBINSON RED A/P 20FR (CATHETERS) ×3 IMPLANT
CLOTH BEACON ORANGE TIMEOUT ST (SAFETY) ×3 IMPLANT
COVER BACK TABLE 60X90IN (DRAPES) ×3 IMPLANT
COVER MAYO STAND STRL (DRAPES) ×3 IMPLANT
DRSG TEGADERM 4X4.75 (GAUZE/BANDAGES/DRESSINGS) ×3 IMPLANT
DRSG TEGADERM 8X12 (GAUZE/BANDAGES/DRESSINGS) ×3 IMPLANT
GLOVE BIO SURGEON STRL SZ7.5 (GLOVE) IMPLANT
GLOVE BIO SURGEON STRL SZ8 (GLOVE) ×6 IMPLANT
GLOVE ECLIPSE 8.0 STRL XLNG CF (GLOVE) IMPLANT
GOWN STRL REUS W/ TWL LRG LVL3 (GOWN DISPOSABLE) ×2 IMPLANT
GOWN STRL REUS W/ TWL XL LVL3 (GOWN DISPOSABLE) ×2 IMPLANT
GOWN STRL REUS W/TWL LRG LVL3 (GOWN DISPOSABLE) ×3
GOWN STRL REUS W/TWL XL LVL3 (GOWN DISPOSABLE) ×3
HOLDER FOLEY CATH W/STRAP (MISCELLANEOUS) ×3 IMPLANT
KIT ROOM TURNOVER WOR (KITS) ×3 IMPLANT
MANIFOLD NEPTUNE II (INSTRUMENTS) IMPLANT
PACK CYSTO (CUSTOM PROCEDURE TRAY) ×3 IMPLANT
SYRINGE 10CC LL (SYRINGE) ×3 IMPLANT
TUBE CONNECTING 12X1/4 (SUCTIONS) IMPLANT
UNDERPAD 30X30 INCONTINENT (UNDERPADS AND DIAPERS) ×6 IMPLANT
WATER STERILE IRR 3000ML UROMA (IV SOLUTION) ×3 IMPLANT
WATER STERILE IRR 500ML POUR (IV SOLUTION) ×3 IMPLANT
selectSeed I-125 ×1 IMPLANT

## 2016-04-22 NOTE — H&P (Signed)
Urology History and Physical Exam  CC: prostate cancer  HPI: 65 year old male presents at this time for radiotherapy of his prostate using I-125 brachytherapy for primary management of adenocarcinoma of the prostate.  His history as below:   He first had a biopsy in June 2008. At that time, prostatic volume was 42 cc, PSA was 4. All biopsies were negative, but basal cell hyperplasia was found in one biopsy. His PSA subsequently climbed over the next few years. In April 2011, PSA was 7.5. After antibiotics, in May 2011 PSA was 5.5. In May, 2013, his PSA was 7.64. He underwent repeat ultrasound and biopsy of his prostate on 05/02/2012.Prostatic volume was 42.7 cc. PCA 3 at that time was 68 (positive). 3/12 cores were positive for adenocarcinoma as follows:  Right apex lateral, Gleason 3+3, less than 5% of core Right apex medial, Gleason 3+3, 5% of core Left apex medial, Gleason 3+3, 20% of core Atypia was found in the left apex lateral biopsy.  Dr. Junious Silk counseled the patient, and discussed treatment with him, including active surveillance. Because of the time and money involved in treatment, the patient did not want any treatment, just active surveillance.   His PSA readings since his diagnosis are as follows:  03/28/2012-7.64 06/21/2012-9.91--this was the first postbiopsy PSA 08/29/2012-6.91 09/21/2013-7.82 09/18/2014--7.71 06/27/2015--7.9  He underwent surveillance biopsy in 01/28/2016. Prostatic volume was 57.01 cc. PSA 7.9. PSA density 0.14. 3/12 cores positive for adenocarcinoma-  left apex lateral biopsy revealed Gleason 3+4 = 7 in 20% of the core Left apex medial biopsy revealed Gleason 3+3 = 7 in 50% of the core Right apex medial biopsy revealed Gleason 3+3 = 7 in 5% of the core Atypia was found in the medial biopsies from the right mid and base.  Because of his upstaging, it was recommended that the patient undergo curativetherapy for his prostate cancer he has decided on  radiotherapy, and consulted with Dr. Tammi Klippel.  He presents at this time for I-125 brachytherapy.   PMH: Past Medical History  Diagnosis Date  . Hyperlipidemia     pt says not anymore  . Elevated PSA   . Prostate cancer ([place]) 2013  . Asthma   . Hypertension     PSH: Past Surgical History  Procedure Laterality Date  . Total hip arthroplasty  2001    left  . Knee arthroscopy  2007    right  . Bilateral inguinal hernia  2008  . Cyst removal trunk  2016    sebaceous cyst  . Prostate biopsy  2008  . Prostate biopsy  2013  . Prostate biopsy  01/28/2016    Allergies: Allergies  Allergen Reactions  . Povidone-Iodine     REACTION: rash    Medications: No prescriptions prior to admission     Social History: Social History   Social History  . Marital Status: Married    Spouse Name: N/A  . Number of Children: N/A  . Years of Education: N/A   Occupational History  . Not on file.   Social History Main Topics  . Smoking status: Former Smoker -- 1.00 packs/day for 32 years    Types: Cigarettes    Quit date: 06/06/1999  . Smokeless tobacco: Never Used  . Alcohol Use: 8.4 oz/week    14 Shots of liquor per week  . Drug Use: No  . Sexual Activity: Yes   Other Topics Concern  . Not on file   Social History Narrative    Family History:  Family History  Problem Relation Age of Onset  . Colon cancer Neg Hx   . Pancreatic cancer Mother   . Heart disease Father     Review of Systems: Positive: the patient does have erectile dysfunction. Negative: A further 10 point review of systems was negative except what is listed in the HPI.                  Physical Exam: @VITALS2 @ General: No acute distress.  Awake. Head:  Normocephalic.  Atraumatic. ENT:  EOMI.  Mucous membranes moist Neck:  Supple.  No lymphadenopathy. CV:  S1 present. S2 present. Regular rate. Pulmonary: Equal effort bilaterally.  Clear to auscultation bilaterally. Abdomen: Soft.  none tender to  palpation. Skin:  Normal turgor.  No visible rash. Extremity: No gross deformity of bilateral upper extremities.  No gross deformity of                             lower extremities. Neurologic: Alert. Appropriate mood.    Studies:  No results for input(s): HGB, WBC, PLT in the last 72 hours.  No results for input(s): NA, K, CL, CO2, BUN, CREATININE, CALCIUM, GFRNONAA, GFRAA in the last 72 hours.  Invalid input(s): MAGNESIUM   No results for input(s): INR, APTT in the last 72 hours.  Invalid input(s): PT   Invalid input(s): ABG    Assessment:  Adenocarcinoma of the prostate, stage TIc, Gleason 3+4 = 7, for brachy therapy  Plan: I-125 brachytherapy

## 2016-04-22 NOTE — Anesthesia Procedure Notes (Signed)
Procedure Name: LMA Insertion Date/Time: 04/22/2016 3:17 PM Performed by: Denna Haggard D Pre-anesthesia Checklist: Patient identified, Emergency Drugs available, Suction available and Patient being monitored Patient Re-evaluated:Patient Re-evaluated prior to inductionOxygen Delivery Method: Circle System Utilized Preoxygenation: Pre-oxygenation with 100% oxygen Intubation Type: IV induction Ventilation: Mask ventilation without difficulty LMA: LMA inserted LMA Size: 4.0 Number of attempts: 1 Airway Equipment and Method: Bite block Placement Confirmation: positive ETCO2 Tube secured with: Tape Dental Injury: Teeth and Oropharynx as per pre-operative assessment

## 2016-04-22 NOTE — Transfer of Care (Signed)
  Immediate Anesthesia Transfer of Care Note  Patient: Ruben Chavez  Procedure(s) Performed: Procedure(s) (LRB): RADIOACTIVE SEED IMPLANT/BRACHYTHERAPY IMPLANT (N/A) CYSTOSCOPY  Patient Location: PACU  Anesthesia Type: General  Level of Consciousness: awake, oriented, sedated and patient cooperative  Airway & Oxygen Therapy: Patient Spontanous Breathing and Patient connected to face mask oxygen  Post-op Assessment: Report given to PACU RN and Post -op Vital signs reviewed and stable  Post vital signs: Reviewed and stable  Complications: No apparent anesthesia complications

## 2016-04-22 NOTE — Op Note (Signed)
Preoperative diagnosis: Clinical stage TI C adenocarcinoma the prostate   Postoperative diagnosis: Same   Procedure: I-125 prostate seed implantation with Nucletron robotic implanter   Surgeon: Lillette Boxer. Christepher Melchior M.D.  Radiation Oncologist: Tyler Pita, M.D.  Anesthesia: Gen.   Indications: Patient  was diagnosed with clinical stage TI C prostate cancer. We had extensive discussion with him about treatment options versus. He elected to proceed with seed implantation. He underwent consultation my office as well as with Dr. Tammi Klippel. He appeared to understand the advantages disadvantages potential risks of this treatment option. Full informed consent has been obtained.   Technique and findings: Patient was brought the operating room where he had successful induction of general anesthesia. He was placed in dorso-lithotomy position and prepped and draped in usual manner. Appropriate surgical timeout was performed. Radiation oncology department placed a transrectal ultrasound probe anchoring stand. Foley catheter with contrast in the balloon was inserted without difficulty. Anchoring needles were placed within the prostate. Rectal tube was placed. Real-time contouring of the urethra prostate and rectum were performed and the dosing parameters were established. Targeted dose was 145 gray.  I was then called  to the operating suite suite for placement of the needles. A second timeout was performed. All needle passage was done with real-time transrectal ultrasound guidance with the sagittal plane. A total of 24 needles were placed. The implantation itself was done with the robotic implanter. 65 active seeds were implanted. The Foley catheter was removed and flexible cystoscopy failed to show any seeds outside the prostate.  The patient was brought to recovery room in stable condition, having tolerated the procedure well.Marland Kitchen

## 2016-04-22 NOTE — Progress Notes (Signed)
Pt constantly looking at cel phone during review of discharge instructions. Wife present and folwing review of instructions. Asked pt to put away phone while instructions reviewed in order to pay attention. Pt complied and review completed.

## 2016-04-22 NOTE — Anesthesia Preprocedure Evaluation (Signed)
Anesthesia Evaluation  Patient identified by MRN, date of birth, ID band Patient awake    Reviewed: Allergy & Precautions, NPO status , Patient's Chart, lab work & pertinent test results  History of Anesthesia Complications Negative for: history of anesthetic complications  Airway Mallampati: II  TM Distance: >3 FB Neck ROM: Full    Dental  (+) Teeth Intact, Dental Advisory Given   Pulmonary former smoker,    Pulmonary exam normal        Cardiovascular hypertension, Normal cardiovascular exam     Neuro/Psych negative neurological ROS  negative psych ROS   GI/Hepatic negative GI ROS, Neg liver ROS,   Endo/Other  negative endocrine ROS  Renal/GU      Musculoskeletal   Abdominal   Peds  Hematology negative hematology ROS (+)   Anesthesia Other Findings   Reproductive/Obstetrics                             Anesthesia Physical Anesthesia Plan  ASA: II  Anesthesia Plan: General   Post-op Pain Management:    Induction: Intravenous  Airway Management Planned: LMA  Additional Equipment:   Intra-op Plan:   Post-operative Plan: Extubation in OR  Informed Consent: I have reviewed the patients History and Physical, chart, labs and discussed the procedure including the risks, benefits and alternatives for the proposed anesthesia with the patient or authorized representative who has indicated his/her understanding and acceptance.   Dental advisory given  Plan Discussed with: Anesthesiologist  Anesthesia Plan Comments:         Anesthesia Quick Evaluation

## 2016-04-22 NOTE — Anesthesia Postprocedure Evaluation (Signed)
Anesthesia Post Note  Patient: Ruben Chavez  Procedure(s) Performed: Procedure(s) (LRB): RADIOACTIVE SEED IMPLANT/BRACHYTHERAPY IMPLANT (N/A) CYSTOSCOPY  Patient location during evaluation: PACU Anesthesia Type: General Level of consciousness: awake and alert and oriented Pain management: pain level controlled Vital Signs Assessment: post-procedure vital signs reviewed and stable Respiratory status: spontaneous breathing, nonlabored ventilation and respiratory function stable Cardiovascular status: blood pressure returned to baseline and stable Postop Assessment: no signs of nausea or vomiting Anesthetic complications: no    Last Vitals:  Filed Vitals:   04/22/16 1730 04/22/16 1745  BP: 196/86 176/83  Pulse: 68 67  Temp:    Resp: 12 15    Last Pain:  Filed Vitals:   04/22/16 1748  PainSc: 4                  Georgine Wiltse A.

## 2016-04-22 NOTE — Discharge Instructions (Signed)
Radioactive Seed Implant Home Care Instructions   Activity:    Rest for the remainder of the day.  Do not drive or operate equipment today.  You may resume normal  activities in a few days as instructed by your physician, without risk of harmful radiation exposure to those around you, provided you follow the time and distance precautions on the Radiation Oncology Instruction Sheet.   Meals: Drink plenty of lipuids and eat light foods, such as gelatin or soup this evening .  You may return to normal meal plan tomorrow.  Return To Work: You may return to work as instructed by Naval architect.  Special Instruction:   If any seeds are found, use tweezers to pick up seeds and place in a glass container of any kind and bring to your physician's office.  Call your physician if any of these symptoms occur:   Persistent or heavy bleeding  Urine stream diminishes or stops completely after catheter is removed  Fever equal to or greater than 101 degrees F  Cloudy urine with a strong foul odor  Severe pain  You may feel some burning pain and/or hesitancy when you urinate after the catheter is removed.  These symptoms may increase over the next few weeks, but should diminish within forur to six weeks.  Applying moist heat to the lower abdomen or a hot tub bath may help relieve the pain.  If the discomfort becomes severe, please call your physician for additional medications.   Radioactive Seed Implant Home Care Instructions   Activity:    Rest for the remainder of the day.  Do not drive or operate equipment today.  You may resume normal  activities in a few days as instructed by your physician, without risk of harmful radiation exposure to those around you, provided you follow the time and distance precautions on the Radiation Oncology Instruction Sheet.   Meals: Drink plenty of lipuids and eat light foods, such as gelatin or soup this evening .  You may return to normal meal plan  tomorrow.  Return To Work: You may return to work as instructed by Naval architect.  Special Instruction:   If any seeds are found, use tweezers to pick up seeds and place in a glass container of any kind and bring to your physician's office.  Call your physician if any of these symptoms occur:   Persistent or heavy bleeding  Urine stream diminishes or stops completely after catheter is removed  Fever equal to or greater than 101 degrees F  Cloudy urine with a strong foul odor  Severe pain  You may feel some burning pain and/or hesitancy when you urinate after the catheter is removed.  These symptoms may increase over the next few weeks, but should diminish within forur to six weeks.  Applying moist heat to the lower abdomen or a hot tub bath may help relieve the pain.  If the discomfort becomes severe, please call your physician for additional medications.  Follow-up (Date of Return Visit to Physician):   Patient:_______________________________   @DATE @  Nurse:________________________________ @DATE @  Post Anesthesia Home Care Instructions  Activity: Get plenty of rest for the remainder of the day. A responsible adult should stay with you for 24 hours following the procedure.  For the next 24 hours, DO NOT: -Drive a car -Paediatric nurse -Drink alcoholic beverages -Take any medication unless instructed by your physician -Make any legal decisions or sign important papers.  Meals: Start with liquid foods such as gelatin  or soup. Progress to regular foods as tolerated. Avoid greasy, spicy, heavy foods. If nausea and/or vomiting occur, drink only clear liquids until the nausea and/or vomiting subsides. Call your physician if vomiting continues.  Special Instructions/Symptoms: Your throat may feel dry or sore from the anesthesia or the breathing tube placed in your throat during surgery. If this causes discomfort, gargle with warm salt water. The discomfort should disappear  within 24 hours.  If you had a scopolamine patch placed behind your ear for the management of post- operative nausea and/or vomiting:  1. The medication in the patch is effective for 72 hours, after which it should be removed.  Wrap patch in a tissue and discard in the trash. Wash hands thoroughly with soap and water. 2. You may remove the patch earlier than 72 hours if you experience unpleasant side effects which may include dry mouth, dizziness or visual disturbances. 3. Avoid touching the patch. Wash your hands with soap and water after contact with the patch.

## 2016-04-23 ENCOUNTER — Encounter (HOSPITAL_BASED_OUTPATIENT_CLINIC_OR_DEPARTMENT_OTHER): Payer: Self-pay | Admitting: Urology

## 2016-04-24 NOTE — Progress Notes (Signed)
  Radiation Oncology         (336) [phone] ________________________________  Name: SIG CLISHAM MRN: [id number]  Date: 04/24/2016  DOB: [birth year]       Prostate Seed Implant  [id number] Pilar Plate, MD  No ref. provider found  DIAGNOSIS: 65 year-old male with stage T1c adenocarcinoma of the prostate with a Gleason's score of 3+4 and a PSA of 7.9    ICD-9-CM ICD-10-CM   1. Pre-operative exam V72.84 Z01.818 DG Chest 2 View     DG Chest 2 View     Discharge patient    PROCEDURE: Insertion of radioactive I-125 seeds into the prostate gland.  RADIATION DOSE: 145 Gy, definitive therapy.  TECHNIQUE: REVANTH TRABUCCO was brought to the operating room with the urologist. He was placed in the dorsolithotomy position. He was catheterized and a rectal tube was inserted. The perineum was shaved, prepped and draped. The ultrasound probe was then introduced into the rectum to see the prostate gland.  TREATMENT DEVICE: A needle grid was attached to the ultrasound probe stand and anchor needles were placed.  3D PLANNING: The prostate was imaged in 3D using a sagittal sweep of the prostate probe. These images were transferred to the planning computer. There, the prostate, urethra and rectum were defined on each axial reconstructed image. Then, the software created an optimized 3D plan and a few seed positions were adjusted. The quality of the plan was reviewed using [facility] information for the target and the following two organs at risk:  Urethra and Rectum.  Then the accepted plan was uploaded to the seed Selectron afterloading unit.  PROSTATE VOLUME STUDY:  Using transrectal ultrasound the volume of the prostate was verified to be 55 cc.  SPECIAL TREATMENT PROCEDURE/SUPERVISION AND HANDLING: The Nucletron FIRST system was used to place the needles under sagittal guidance. A total of 24 needles were used to deposit 65 seeds in the prostate gland. The individual seed activity was 0.591  mCi.  COMPLEX SIMULATION: At the end of the procedure, an anterior radiograph of the pelvis was obtained to document seed positioning and count. Cystoscopy was performed to check the urethra and bladder.  MICRODOSIMETRY: At the end of the procedure, the patient was emitting 0.08 mR/hr at 1 meter. Accordingly, he was considered safe for hospital discharge.  PLAN: The patient will return to the radiation oncology clinic for post implant CT dosimetry in three weeks.   ________________________________  Sheral Apley Tammi Klippel, M.D.

## 2016-04-27 ENCOUNTER — Telehealth: Payer: Self-pay | Admitting: *Deleted

## 2016-04-27 NOTE — Telephone Encounter (Signed)
CALLED PATIENT TO INFORM THAT POST SEED APPTS. HAVE BEEN MOVED TO 05-04-16, SPOKE WITH PATIENT AND HE IS AWARE OF THIS CHANGE AND GOOD WITH THIS CHANGE

## 2016-05-04 ENCOUNTER — Encounter: Payer: Self-pay | Admitting: Radiation Oncology

## 2016-05-04 ENCOUNTER — Ambulatory Visit
Admission: RE | Admit: 2016-05-04 | Discharge: 2016-05-04 | Disposition: A | Discharge status: Home / Self Care | Payer: BLUE CROSS/BLUE SHIELD | Source: Ambulatory Visit | Attending: Radiation Oncology | Admitting: Radiation Oncology

## 2016-05-04 VITALS — BP 124/69 | HR 59 | Resp 16 | Wt 185.7 lb

## 2016-05-04 DIAGNOSIS — I10 Essential (primary) hypertension: Secondary | ICD-10-CM | POA: Diagnosis not present

## 2016-05-04 DIAGNOSIS — J45909 Unspecified asthma, uncomplicated: Secondary | ICD-10-CM | POA: Diagnosis not present

## 2016-05-04 DIAGNOSIS — E785 Hyperlipidemia, unspecified: Secondary | ICD-10-CM | POA: Diagnosis not present

## 2016-05-04 DIAGNOSIS — C61 Malignant neoplasm of prostate: Secondary | ICD-10-CM | POA: Diagnosis not present

## 2016-05-04 DIAGNOSIS — Z510 Encounter for antineoplastic radiation therapy: Secondary | ICD-10-CM | POA: Diagnosis not present

## 2016-05-04 MED ORDER — SONAFINE EX EMUL
1.0000 "application " | Freq: Two times a day (BID) | CUTANEOUS | Status: DC
  Administered 2016-05-04: 1 via TOPICAL
  Filled 2016-05-04: qty 45

## 2016-05-04 NOTE — Progress Notes (Signed)
  Radiation Oncology         [phone] ________________________________  Name: Ruben Chavez MRN: [id number]  Date: 05/04/2016  DOB: Apr 13, 1951  COMPLEX SIMULATION NOTE  NARRATIVE:  The patient was brought to the [facility] today following prostate seed implantation approximately one month ago.  Identity was confirmed.  All relevant records and images related to the planned course of therapy were reviewed.  Then, the patient was set-up supine.  CT images were obtained.  The CT images were loaded into the planning software.  Then the prostate and rectum were contoured.  Treatment planning then occurred.  The implanted iodine 125 seeds were identified by the physics staff for projection of radiation distribution  I have requested : 3D Simulation  I have requested a DVH of the following structures: Prostate and rectum.    ________________________________  Sheral Apley Tammi Klippel, M.D.    This document serves as a record of services personally performed by Tyler Pita, MD. It was created on his behalf by Lendon Collar, a trained medical scribe. The creation of this record is based on the scribe's personal observations and the provider's statements to them. This document has been checked and approved by the attending provider.

## 2016-05-04 NOTE — Progress Notes (Signed)
Radiation Oncology         (336) [phone] ________________________________  Name: Ruben Chavez MRN: [id number]  Date: 05/04/2016  DOB: 08/20/1951  Follow-Up Visit Note  CC: Nyoka Cowden, MD  [name], MD  Diagnosis:   64 year-old male with stage T1c adenocarcinoma of the prostate with a Gleason's score of 3+4 and a PSA of 7.9    ICD-9-CM ICD-10-CM   1. Prostate cancer (HCC) 185 C61 SONAFINE emulsion 1 application    Interval Since Last Radiation:  2 weeks.  Narrative:  The patient returns today for routine follow-up. He reports that he is doing great except for irritation and pruritis of the perineum and increased urinary frequency and urinary hesitation symptoms. He filled out a questionnaire regarding urinary function today providing and overall IPSS score of 10 characterizing his symptoms as moderate.  His pre-implant score was 2. He denies any bowel dysfunction. He is not noting shortness of breath or chest pain. No other complaints are noted.  ALLERGIES:  is allergic to povidone-iodine.  Meds: Current Outpatient Prescriptions  Medication Sig Dispense Refill  . aspirin 81 MG tablet Take 81 mg by mouth daily.    Marland Kitchen HYDROcodone-acetaminophen (NORCO/VICODIN) 5-325 MG tablet Take 1 tablet by mouth every 6 (six) hours as needed for moderate pain. 60 tablet 0  . Krill Oil 300 MG CAPS Take 1 capsule by mouth daily.    Marland Kitchen lisinopril-hydrochlorothiazide (PRINZIDE,ZESTORETIC) 20-25 MG tablet Take 1 tablet by mouth daily. 90 tablet 3  . multivitamin (THERAGRAN) per tablet Take 1 tablet by mouth daily.      . naproxen sodium (ANAPROX) 220 MG tablet Take 220 mg by mouth daily.    . Potassium Gluconate 550 MG TABS Take 1 tablet by mouth daily.    . sildenafil (VIAGRA) 100 MG tablet Take 100 mg by mouth daily as needed.      . traMADol (ULTRAM) 50 MG tablet Take 1 tablet (50 mg total) by mouth every 6 (six) hours as needed for moderate pain. 10 tablet 0   Current  Facility-Administered Medications  Medication Dose Route Frequency Provider Last Rate Last Dose  . SONAFINE emulsion 1 application  1 application Topical BID Hayden Pedro, PA-C   1 application at [id number]    Physical Findings:  weight is 185 lb 11.2 oz (84.233 kg). His blood pressure is 124/69 and his pulse is 59. His respiration is 16 and oxygen saturation is 100%.   In general this is a well appearing caucasian male in no acute distress. He's alert and oriented x4 and appropriate throughout the examination. Cardiopulmonary assessment is negative for acute distress and he exhibits normal effort. The perineum is intact without ulceration, there is superficial sloughing of epidermal skin. The entire perineum extending down and around the anus is involved. No puntate lesions or satellite erythema is noted.   Lab Findings: Lab Results  Component Value Date   WBC 5.9 04/15/2016   HGB 14.0 04/15/2016   HCT 41.2 04/15/2016   MCV 94.3 04/15/2016   PLT 227 04/15/2016    Radiographic Findings:  Patient underwent CT imaging in our clinic for post implant dosimetry. The CT appears to demonstrate an adequate distribution of radioactive seeds throughout the prostate gland. There no seeds in her near the rectum. Dr. Tammi Klippel suspects the final radiation plan and dosimetry will show appropriate coverage of the prostate gland.   Impression: The patient is recovering from the effects of radiation. His urinary symptoms should gradually improve  over the next 4-6 months. We talked about this today. He is encouraged by his improvement already and is otherwise please with his outcome.   Plan: Today we spent time talking to the patient about his prostate seed implant and resolving urinary symptoms. We also discussed his skin concerns and have given him a tube of sonafine cream to apply TID, prn, aquaphor to use for moisture, and discussed the use of OTC hydrocortisone 1% cream if sonafine does not  address his pruritis. He is encouraged to call back if he symptoms do not improve in the next 3-4 weeks. We also talked about long-term follow-up for prostate cancer following seed implant. He understands that ongoing PSA determinations and digital rectal exams will help perform surveillance to rule out disease recurrence. He understands what to expect with his PSA measures. Patient was also educated today about some of the long-term effects from radiation including a small risk for rectal bleeding and possibly erectile dysfunction. We talked about some of the general management approaches to these potential complications. However, we did encourage the patient to contact our office or return at any point if he has questions or concerns related to his previous radiation and prostate cancer.    Carola Rhine, PAC    This document serves as a record of services personally performed by Carola Rhine, PAC. It was created on her behalf by Lendon Collar, a trained medical scribe. The creation of this record is based on the scribe's personal observations and the provider's statements to them. This document has been checked and approved by the attending provider.

## 2016-05-04 NOTE — Progress Notes (Addendum)
Patient presents today for post seed follow up with Dr. Tammi Klippel. Patient is two weeks s/p implant. Pre seed IPSS 2 but, post seed IPSS is 10. Patient's biggest complaint is a "raw itchy anus." Patient reports there is no visible rash around or near his anus. Reports using talcum powder and triple action antibiotic on anus without relief. Intermittent stream, urinary urgency, and nocturia x 2 are other complaints. Reports dysuria and hematuria resolved quickly following surgery. Denise urinary incontinence or leakage. Reports about 1/2 the time he experience urinary hesitancy.  BP 124/69 mmHg  Pulse 59  Resp 16  Wt 185 lb 11.2 oz (84.233 kg)  SpO2 100% Wt Readings from Last 3 Encounters:  05/04/16 185 lb 11.2 oz (84.233 kg)  04/22/16 178 lb (80.74 kg)  03/18/16 187 lb (84.823 kg)

## 2016-05-07 ENCOUNTER — Telehealth: Payer: Self-pay | Admitting: Radiation Oncology

## 2016-05-07 NOTE — Telephone Encounter (Signed)
Per PA Dara Lords order phoned patient to explain that PCMX surgical scrub was used to prep the site prior to seed implantation. Explained this has been added to his allergy list as an intolerance. Patient verbalized understanding and expressed appreciation for the staff taking the time to investigate. Patient questions the results of his CT scan done earlier this week. Also, patient reports increased nocturia and incontinence. Patient understands this RN will inform PA Dara Lords of the findings and call him back with further directions.

## 2016-05-07 NOTE — Telephone Encounter (Signed)
Per Dr. Johny Shears order called in Flomax 0.4 mg tablet and diflucan 200 mg to Ruben Chavez, Ruben Chavez. Phoned patient informing him this was done and reviewed medication directions with him. Patient verbalized understanding.

## 2016-05-07 NOTE — Addendum Note (Signed)
Encounter addended by: Heywood Footman, RN on: 05/07/2016  1:15 PM<BR>     Documentation filed: Charges VN

## 2016-05-10 ENCOUNTER — Encounter: Payer: Self-pay | Admitting: Radiation Oncology

## 2016-05-10 DIAGNOSIS — C61 Malignant neoplasm of prostate: Secondary | ICD-10-CM | POA: Diagnosis not present

## 2016-05-10 DIAGNOSIS — J45909 Unspecified asthma, uncomplicated: Secondary | ICD-10-CM | POA: Diagnosis not present

## 2016-05-10 DIAGNOSIS — I10 Essential (primary) hypertension: Secondary | ICD-10-CM | POA: Diagnosis not present

## 2016-05-10 DIAGNOSIS — Z510 Encounter for antineoplastic radiation therapy: Secondary | ICD-10-CM | POA: Diagnosis not present

## 2016-05-10 DIAGNOSIS — E785 Hyperlipidemia, unspecified: Secondary | ICD-10-CM | POA: Diagnosis not present

## 2016-05-14 ENCOUNTER — Ambulatory Visit: Payer: Self-pay | Admitting: Radiation Oncology

## 2016-05-14 ENCOUNTER — Ambulatory Visit: Payer: BLUE CROSS/BLUE SHIELD | Admitting: Radiation Oncology

## 2016-05-16 NOTE — Progress Notes (Signed)
  Radiation Oncology         [phone] ________________________________  Name: Ruben Chavez MRN: [id number]  Date: 05/10/2016  DOB: 12/07/1950  3D Planning Note   Prostate Brachytherapy Post-Implant Dosimetry  Diagnosis: 65 year-old male with stage T1c adenocarcinoma of the prostate with a Gleason's score of 3+4 and a PSA of 7.9   Narrative: On a previous date, Ruben Chavez returned following prostate seed implantation for post implant planning. He underwent CT scan complex simulation to delineate the three-dimensional structures of the pelvis and demonstrate the radiation distribution.  Since that time, the seed localization, and complex isodose planning with dose volume histograms have now been completed.  Results:   Prostate Coverage - The dose of radiation delivered to the 90% or more of the prostate gland (D90) was 109.74% of the prescription dose. This exceeds our goal of greater than 90%. Rectal Sparing - The volume of rectal tissue receiving the prescription dose or higher was 0.0 cc. This falls under our thresholds tolerance of 1.0 cc.  Impression: The prostate seed implant appears to show adequate target coverage and appropriate rectal sparing.  Plan:  The patient will continue to follow with urology for ongoing PSA determinations. I would anticipate a high likelihood for local tumor control with minimal risk for rectal morbidity.  ________________________________  Sheral Apley Tammi Klippel, M.D.

## 2016-05-17 ENCOUNTER — Telehealth: Payer: Self-pay | Admitting: Radiation Oncology

## 2016-05-17 NOTE — Telephone Encounter (Signed)
Received call from the patient that Flomax script went through but, Diflucan didn't. Called [place] at [facility]. Called Diflucan script in a second time. Called patient back and informed him this was done. Patient verbalized understanding.

## 2016-06-12 ENCOUNTER — Other Ambulatory Visit: Payer: Self-pay | Admitting: Radiation Oncology

## 2016-07-12 ENCOUNTER — Other Ambulatory Visit: Payer: Self-pay | Admitting: Radiation Oncology

## 2016-07-16 ENCOUNTER — Telehealth: Payer: Self-pay | Admitting: Radiation Oncology

## 2016-07-16 MED ORDER — TAMSULOSIN HCL 0.4 MG PO CAPS: 0.4000 mg | ORAL_CAPSULE | Freq: Every day | ORAL | 2 refills | Status: AC

## 2016-07-16 NOTE — Telephone Encounter (Signed)
Faxed signed authorization to refill tamsulosin back to [facility] [place], [place]. Confirmation fax of delivery obtained. Two refills given. Future refills will need to come from urologist per Shona Simpson, PA-C.

## 2016-07-16 NOTE — Telephone Encounter (Signed)
-----   Message from Hayden Pedro, [place] sent at 07/15/2016  4:29 PM EDT ----- Regarding: RE: Medication refill request That's fine to give him two months worth and just let him know additional will come from urology ----- Message ----- From: Heywood Footman, RN Sent: 07/15/2016   3:03 PM To: Hayden Pedro, PA-C Subject: Medication refill request                      Ruben Chavez.   Received fax request to refill patient's tamsulosin. Dr. Tammi Klippel wrote this script on 06/13/16. Patient was last seen in the office for post seed follow up on 05/14/16. Patient does not have any future appointments scheduled with our office.  Sam

## 2016-07-21 ENCOUNTER — Telehealth: Payer: Self-pay | Admitting: Internal Medicine

## 2016-07-21 DIAGNOSIS — C61 Malignant neoplasm of prostate: Secondary | ICD-10-CM | POA: Diagnosis not present

## 2016-07-21 DIAGNOSIS — Z125 Encounter for screening for malignant neoplasm of prostate: Secondary | ICD-10-CM

## 2016-07-21 DIAGNOSIS — N5201 Erectile dysfunction due to arterial insufficiency: Secondary | ICD-10-CM | POA: Diagnosis not present

## 2016-07-21 NOTE — Telephone Encounter (Signed)
Pt called back and he furnished the following [facility] Provider Dr. Dorita Fray and need to have Prostate Cancer Screening with Code: C61.  Pt prefer to have it done at [facility] due to the cost.

## 2016-07-21 NOTE — Telephone Encounter (Signed)
Pt would like to have his PSA drawn here that a [facility] request him to have.  Pt phone was cutting in and out and not sure what happened the call was dropped and I couldn't hear the pt but he stated he could hear me.

## 2016-07-27 NOTE — Telephone Encounter (Signed)
Yes thanks 

## 2016-07-28 ENCOUNTER — Other Ambulatory Visit: Payer: Self-pay

## 2016-08-02 NOTE — Telephone Encounter (Signed)
Dr.K, pt wants to have PSA drawn here for Urology okay to put order in? Dr. Yong Channel said to check with you to make sure okay.

## 2016-08-02 NOTE — Telephone Encounter (Signed)
Okay for PSA

## 2016-08-03 NOTE — Telephone Encounter (Signed)
Spoke to pt, told him sorry did not get back to him sooner Dr.K was on vacation. Told him Dr.K said it was okay to order PSA and I have put orders in EPIC just needs to schedule lab appt only. Pt verbalized understanding.

## 2016-09-28 ENCOUNTER — Other Ambulatory Visit (INDEPENDENT_AMBULATORY_CARE_PROVIDER_SITE_OTHER): Payer: BLUE CROSS/BLUE SHIELD

## 2016-09-28 DIAGNOSIS — Z0000 Encounter for general adult medical examination without abnormal findings: Secondary | ICD-10-CM

## 2016-09-28 DIAGNOSIS — Z125 Encounter for screening for malignant neoplasm of prostate: Secondary | ICD-10-CM | POA: Diagnosis not present

## 2016-09-28 LAB — CBC WITH DIFFERENTIAL/PLATELET
BASOS ABS: 0 10*3/uL (ref 0.0–0.1)
BASOS PCT: 0.6 % (ref 0.0–3.0)
EOS ABS: 0.2 10*3/uL (ref 0.0–0.7)
Eosinophils Relative: 2.9 % (ref 0.0–5.0)
HCT: 41.6 % (ref 39.0–52.0)
HEMOGLOBIN: 14.3 g/dL (ref 13.0–17.0)
Lymphocytes Relative: 15.9 % (ref 12.0–46.0)
Lymphs Abs: 0.9 10*3/uL (ref 0.7–4.0)
MCHC: 34.4 g/dL (ref 30.0–36.0)
MCV: 92.6 fl (ref 78.0–100.0)
MONO ABS: 0.5 10*3/uL (ref 0.1–1.0)
Monocytes Relative: 8.3 % (ref 3.0–12.0)
NEUTROS ABS: 4.2 10*3/uL (ref 1.4–7.7)
Neutrophils Relative %: 72.3 % (ref 43.0–77.0)
PLATELETS: 248 10*3/uL (ref 150.0–400.0)
RBC: 4.5 Mil/uL (ref 4.22–5.81)
RDW: 14.7 % (ref 11.5–15.5)
WBC: 5.7 10*3/uL (ref 4.0–10.5)

## 2016-09-28 LAB — POC URINALSYSI DIPSTICK (AUTOMATED)
BILIRUBIN UA: NEGATIVE
Blood, UA: NEGATIVE
GLUCOSE UA: NEGATIVE
KETONES UA: NEGATIVE
Leukocytes, UA: NEGATIVE
Nitrite, UA: NEGATIVE
SPEC GRAV UA: 1.025
Urobilinogen, UA: 0.2
pH, UA: 6

## 2016-09-28 LAB — LIPID PANEL
CHOL/HDL RATIO: 3
Cholesterol: 144 mg/dL (ref 0–200)
HDL: 49.2 mg/dL (ref >39.00)
LDL CALC: 75 mg/dL (ref 0–99)
NonHDL: 94.36
TRIGLYCERIDES: 95 mg/dL (ref 0.0–149.0)
VLDL: 19 mg/dL (ref 0.0–40.0)

## 2016-09-28 LAB — BASIC METABOLIC PANEL
BUN: 22 mg/dL (ref 6–23)
CHLORIDE: 100 meq/L (ref 96–112)
CO2: 34 mEq/L — ABNORMAL HIGH (ref 19–32)
CREATININE: 0.94 mg/dL (ref 0.40–1.50)
Calcium: 10.2 mg/dL (ref 8.4–10.5)
GFR: 85.15 mL/min (ref >60.00)
Glucose, Bld: 89 mg/dL (ref 70–99)
Potassium: 4.6 mEq/L (ref 3.5–5.1)
Sodium: 139 mEq/L (ref 135–145)

## 2016-09-28 LAB — HEPATIC FUNCTION PANEL
ALK PHOS: 47 U/L (ref 39–117)
ALT: 23 U/L (ref 0–53)
AST: 22 U/L (ref 0–37)
Albumin: 4.3 g/dL (ref 3.5–5.2)
BILIRUBIN DIRECT: 0.1 mg/dL (ref 0.0–0.3)
BILIRUBIN TOTAL: 0.6 mg/dL (ref 0.2–1.2)
Total Protein: 6.9 g/dL (ref 6.0–8.3)

## 2016-09-28 LAB — PSA: PSA: 1.45 ng/mL (ref 0.10–4.00)

## 2016-09-28 LAB — TSH: TSH: 1.86 u[IU]/mL (ref 0.35–4.50)

## 2016-10-05 ENCOUNTER — Encounter: Payer: Self-pay | Admitting: Internal Medicine

## 2016-10-05 ENCOUNTER — Ambulatory Visit (INDEPENDENT_AMBULATORY_CARE_PROVIDER_SITE_OTHER): Payer: BLUE CROSS/BLUE SHIELD | Admitting: Internal Medicine

## 2016-10-05 VITALS — BP 138/80 | HR 57 | Temp 98.2°F | Resp 20 | Ht 68.75 in | Wt 183.2 lb

## 2016-10-05 DIAGNOSIS — Z23 Encounter for immunization: Secondary | ICD-10-CM

## 2016-10-05 DIAGNOSIS — Z0000 Encounter for general adult medical examination without abnormal findings: Secondary | ICD-10-CM | POA: Diagnosis not present

## 2016-10-05 MED ORDER — HYDROCODONE-ACETAMINOPHEN 5-325 MG PO TABS: 1.0000 | ORAL_TABLET | Freq: Four times a day (QID) | ORAL | 0 refills | Status: DC | PRN

## 2016-10-05 NOTE — Patient Instructions (Signed)
Limit your sodium (Salt) intake  Please check your blood pressure on a regular basis.  If it is consistently greater than 150/90, please make an office appointment.    It is important that you exercise regularly, at least 20 minutes 3 to 4 times per week.  If you develop chest pain or shortness of breath seek  medical attention.  Return in one year for follow-up  

## 2016-10-05 NOTE — Progress Notes (Signed)
Pre visit review using our clinic review tool, if applicable. No additional management support is needed unless otherwise documented below in the visit note. 

## 2016-10-05 NOTE — Progress Notes (Signed)
Subjective:    Patient ID: Ruben Chavez, male    DOB: [birth year], 65 y.o.   MRN: [id number]  HPI   65 year old patient who is seen today for a preventive health examination.   He has a prior history of dyslipidemia that has normalized following dramatic weight loss. He has a number of orthopedic issues, which have been stable He is followed by urology for low-grade prostate cancer. He was treated with radioactive seed implantation in the spring.  He has done well, although having some increase urgency and frequency He does have a history of colonic polyps and had follow-up colonoscopy in January 2017.   Family history mother died of complications of probable pancreatic cancer Father died age 67 of an MI  One brother, age 52 one sister, age 60 in good health   Past Medical History:  Diagnosis Date  . Asthma   . Elevated PSA   . Hyperlipidemia    pt says not anymore  . Hypertension   . Prostate cancer [facility]) 2013    Social History   Social History  . Marital status: Married    Spouse name: N/A  . Number of children: N/A  . Years of education: N/A   Occupational History  . Not on file.   Social History Main Topics  . Smoking status: Former Smoker    Packs/day: 1.00    Years: 32.00    Types: Cigarettes    Quit date: 06/06/1999  . Smokeless tobacco: Never Used  . Alcohol use 8.4 oz/week    14 Shots of liquor per week  . Drug use: No  . Sexual activity: Yes   Other Topics Concern  . Not on file   Social History Narrative  . No narrative on file    Past Surgical History:  Procedure Laterality Date  . bilateral inguinal hernia  2008  . CYST REMOVAL TRUNK  2016   sebaceous cyst  . CYSTOSCOPY  04/22/2016   Procedure: CYSTOSCOPY;  Surgeon: [name], MD;  Location: [facility];  Service: Urology;;  . KNEE ARTHROSCOPY  2007   right  . PROSTATE BIOPSY  2008  . PROSTATE BIOPSY  2013  . PROSTATE BIOPSY  01/28/2016  . RADIOACTIVE SEED  IMPLANT N/A 04/22/2016   Procedure: RADIOACTIVE SEED IMPLANT/BRACHYTHERAPY IMPLANT;  Surgeon: [name], MD;  Location[facility];  Service: Urology;  Laterality: N/A;  . TOTAL HIP ARTHROPLASTY  2001   left    Family History  Problem Relation Age of Onset  . Colon cancer Neg Hx   . Pancreatic cancer Mother   . Heart disease Father     Allergies  Allergen Reactions  . Povidone-Iodine     REACTION: rash  . Skin Protectants, Misc. Rash    PCMX surgical sterilizing scrub    Current Outpatient Prescriptions on File Prior to Visit  Medication Sig Dispense Refill  . aspirin 81 MG tablet Take 81 mg by mouth daily.    Marland Kitchen HYDROcodone-acetaminophen (NORCO/VICODIN) 5-325 MG tablet Take 1 tablet by mouth every 6 (six) hours as needed for moderate pain. 60 tablet 0  . Krill Oil 300 MG CAPS Take 1 capsule by mouth daily.    Marland Kitchen lisinopril-hydrochlorothiazide (PRINZIDE,ZESTORETIC) 20-25 MG tablet Take 1 tablet by mouth daily. 90 tablet 3  . multivitamin (THERAGRAN) per tablet Take 1 tablet by mouth daily.      . naproxen sodium (ANAPROX) 220 MG tablet Take 220 mg by mouth daily.    Marland Kitchen  Potassium Gluconate 550 MG TABS Take 1 tablet by mouth daily.    . sildenafil (VIAGRA) 100 MG tablet Take 100 mg by mouth daily as needed.       No current facility-administered medications on file prior to visit.     There were no vitals taken for this visit.      Review of Systems  Constitutional: Negative for activity change, appetite change, chills, fatigue and fever.  HENT: Negative for congestion, dental problem, ear pain, hearing loss, mouth sores, rhinorrhea, sinus pressure, sneezing, tinnitus, trouble swallowing and voice change.   Eyes: Negative for photophobia, pain, redness and visual disturbance.  Respiratory: Negative for apnea, cough, choking, chest tightness, shortness of breath and wheezing.   Cardiovascular: Negative for chest pain, palpitations and leg swelling.    Gastrointestinal: Negative for abdominal distention, abdominal pain, anal bleeding, blood in stool, constipation, diarrhea, nausea, rectal pain and vomiting.  Genitourinary: Negative for decreased urine volume, difficulty urinating, discharge, dysuria, flank pain, frequency, genital sores, hematuria, penile swelling, scrotal swelling, testicular pain and urgency.  Musculoskeletal: Positive for arthralgias and back pain. Negative for gait problem, joint swelling, myalgias, neck pain and neck stiffness.  Skin: Negative for color change, rash and wound.  Neurological: Negative for dizziness, tremors, seizures, syncope, facial asymmetry, speech difficulty, weakness, light-headedness, numbness and headaches.  Hematological: Negative for adenopathy. Does not bruise/bleed easily.  Psychiatric/Behavioral: Negative for agitation, behavioral problems, confusion, decreased concentration, dysphoric mood, hallucinations, self-injury, sleep disturbance and suicidal ideas. The patient is not nervous/anxious.        Objective:   Physical Exam  Constitutional: He appears well-developed and well-nourished.  Blood pressure 130/80  HENT:  Head: Normocephalic and atraumatic.  Right Ear: External ear normal.  Left Ear: External ear normal.  Nose: Nose normal.  Mouth/Throat: Oropharynx is clear and moist.  Eyes: Conjunctivae and EOM are normal. Pupils are equal, round, and reactive to light. No scleral icterus.  Neck: Normal range of motion. Neck supple. No JVD present. No thyromegaly present.  Cardiovascular: Regular rhythm and normal heart sounds.  Exam reveals no gallop and no friction rub.   No murmur heard. The left dorsalis pedis pulse and right posterior tibial pulse intact  Pulmonary/Chest: Effort normal and breath sounds normal. He exhibits no tenderness.  Abdominal: Soft. Bowel sounds are normal. He exhibits no distension and no mass. There is no tenderness.  Genitourinary: Prostate normal and penis  normal.  Musculoskeletal: Normal range of motion. He exhibits no edema or tenderness.  Lymphadenopathy:    He has no cervical adenopathy.  Neurological: He is alert. He has normal reflexes. No cranial nerve deficit. Coordination normal.  Skin: Skin is warm and dry. No rash noted.  Psychiatric: He has a normal mood and affect. His behavior is normal.          Assessment & Plan:    Preventive health examination History of prostate cancer.  Followup urology.  Status post radioactive seed implantation History of Mild dyslipidemia.  Well controlled with weight loss and diet Hypertension, stable.  Continue home blood pressure monitoring  Follow-up urology Recheck here one year or as needed  Nyoka Cowden

## 2016-10-06 ENCOUNTER — Other Ambulatory Visit: Payer: Self-pay | Admitting: Internal Medicine

## 2016-10-27 ENCOUNTER — Other Ambulatory Visit: Payer: BLUE CROSS/BLUE SHIELD

## 2016-11-03 ENCOUNTER — Telehealth: Payer: Self-pay | Admitting: Internal Medicine

## 2016-11-03 DIAGNOSIS — C61 Malignant neoplasm of prostate: Secondary | ICD-10-CM | POA: Diagnosis not present

## 2016-11-03 DIAGNOSIS — N5201 Erectile dysfunction due to arterial insufficiency: Secondary | ICD-10-CM | POA: Diagnosis not present

## 2016-11-03 NOTE — Telephone Encounter (Signed)
° ° ° ° °  Pt would like to have a PSA test need a order please   Wednesday  03/09/17 at 9:15

## 2016-11-04 NOTE — Telephone Encounter (Signed)
Ruben Chavez order put in EPIC. Please call pt and schedule appt.

## 2016-11-04 NOTE — Telephone Encounter (Signed)
Spoke to pt, asked him why he needs a PSA done again due to just had done in Oct. Pt said his Urologist wanted it checked again in April and it is cheaper for him to have done here that at the Urologist. Told him okay I will put order in. Pt verbalized understanding. Order put in EPIC.

## 2016-11-07 DIAGNOSIS — L255 Unspecified contact dermatitis due to plants, except food: Secondary | ICD-10-CM | POA: Diagnosis not present

## 2017-01-21 DIAGNOSIS — H524 Presbyopia: Secondary | ICD-10-CM | POA: Diagnosis not present

## 2017-03-09 ENCOUNTER — Other Ambulatory Visit (INDEPENDENT_AMBULATORY_CARE_PROVIDER_SITE_OTHER): Payer: BLUE CROSS/BLUE SHIELD

## 2017-03-09 DIAGNOSIS — C61 Malignant neoplasm of prostate: Secondary | ICD-10-CM

## 2017-03-09 LAB — PSA
PSA: 0.62
PSA: 0.62 ng/mL (ref 0.10–4.00)

## 2017-03-23 DIAGNOSIS — N5201 Erectile dysfunction due to arterial insufficiency: Secondary | ICD-10-CM | POA: Diagnosis not present

## 2017-03-23 DIAGNOSIS — C61 Malignant neoplasm of prostate: Secondary | ICD-10-CM | POA: Diagnosis not present

## 2017-03-25 ENCOUNTER — Encounter: Payer: Self-pay | Admitting: Family Medicine

## 2017-08-25 ENCOUNTER — Encounter: Payer: Self-pay | Admitting: Internal Medicine

## 2017-09-23 ENCOUNTER — Other Ambulatory Visit: Payer: Self-pay | Admitting: Internal Medicine

## 2017-10-04 ENCOUNTER — Other Ambulatory Visit: Payer: BLUE CROSS/BLUE SHIELD

## 2017-10-11 ENCOUNTER — Encounter: Payer: BLUE CROSS/BLUE SHIELD | Admitting: Internal Medicine

## 2017-10-12 ENCOUNTER — Encounter: Payer: Self-pay | Admitting: Internal Medicine

## 2017-10-12 ENCOUNTER — Ambulatory Visit (INDEPENDENT_AMBULATORY_CARE_PROVIDER_SITE_OTHER): Payer: BLUE CROSS/BLUE SHIELD | Admitting: Internal Medicine

## 2017-10-12 VITALS — BP 142/62 | HR 68 | Temp 98.2°F | Ht 68.0 in | Wt 182.4 lb

## 2017-10-12 DIAGNOSIS — Z0000 Encounter for general adult medical examination without abnormal findings: Secondary | ICD-10-CM | POA: Diagnosis not present

## 2017-10-12 DIAGNOSIS — Z23 Encounter for immunization: Secondary | ICD-10-CM | POA: Diagnosis not present

## 2017-10-12 DIAGNOSIS — Z125 Encounter for screening for malignant neoplasm of prostate: Secondary | ICD-10-CM | POA: Diagnosis not present

## 2017-10-12 LAB — COMPREHENSIVE METABOLIC PANEL
ALBUMIN: 4.3 g/dL (ref 3.5–5.2)
ALK PHOS: 41 U/L (ref 39–117)
ALT: 23 U/L (ref 0–53)
AST: 20 U/L (ref 0–37)
BUN: 24 mg/dL — AB (ref 6–23)
CALCIUM: 10.3 mg/dL (ref 8.4–10.5)
CO2: 32 mEq/L (ref 19–32)
Chloride: 101 mEq/L (ref 96–112)
Creatinine, Ser: 0.92 mg/dL (ref 0.40–1.50)
GFR: 87.02 mL/min (ref >60.00)
Glucose, Bld: 92 mg/dL (ref 70–99)
POTASSIUM: 4.9 meq/L (ref 3.5–5.1)
SODIUM: 140 meq/L (ref 135–145)
TOTAL PROTEIN: 7.4 g/dL (ref 6.0–8.3)
Total Bilirubin: 0.9 mg/dL (ref 0.2–1.2)

## 2017-10-12 LAB — CBC WITH DIFFERENTIAL/PLATELET
BASOS ABS: 0 10*3/uL (ref 0.0–0.1)
Basophils Relative: 0.4 % (ref 0.0–3.0)
EOS PCT: 2.1 % (ref 0.0–5.0)
Eosinophils Absolute: 0.1 10*3/uL (ref 0.0–0.7)
HCT: 41.8 % (ref 39.0–52.0)
HEMOGLOBIN: 14 g/dL (ref 13.0–17.0)
LYMPHS ABS: 1 10*3/uL (ref 0.7–4.0)
Lymphocytes Relative: 17.6 % (ref 12.0–46.0)
MCHC: 33.4 g/dL (ref 30.0–36.0)
MCV: 97 fl (ref 78.0–100.0)
MONO ABS: 0.6 10*3/uL (ref 0.1–1.0)
MONOS PCT: 10.4 % (ref 3.0–12.0)
NEUTROS PCT: 69.5 % (ref 43.0–77.0)
Neutro Abs: 3.9 10*3/uL (ref 1.4–7.7)
Platelets: 258 10*3/uL (ref 150.0–400.0)
RBC: 4.31 Mil/uL (ref 4.22–5.81)
RDW: 14.2 % (ref 11.5–15.5)
WBC: 5.7 10*3/uL (ref 4.0–10.5)

## 2017-10-12 LAB — TSH: TSH: 1.44 u[IU]/mL (ref 0.35–4.50)

## 2017-10-12 LAB — LIPID PANEL
CHOLESTEROL: 139 mg/dL (ref 0–200)
HDL: 42.6 mg/dL (ref >39.00)
LDL Cholesterol: 73 mg/dL (ref 0–99)
NonHDL: 96.61
TRIGLYCERIDES: 119 mg/dL (ref 0.0–149.0)
Total CHOL/HDL Ratio: 3
VLDL: 23.8 mg/dL (ref 0.0–40.0)

## 2017-10-12 LAB — PSA: PSA: 0.53 ng/mL (ref 0.10–4.00)

## 2017-10-12 MED ORDER — HYDROCODONE-ACETAMINOPHEN 5-325 MG PO TABS: 1.0000 | ORAL_TABLET | Freq: Four times a day (QID) | ORAL | 0 refills | Status: DC | PRN

## 2017-10-12 NOTE — Progress Notes (Signed)
Subjective:    Patient ID: Ruben Chavez, male    DOB: 1951-01-30, 66 y.o.   MRN: [id number]  HPI  66 year old patient who is seen today for a preventive health examination. He has a history of diet-controlled dyslipidemia. His main concern today is hip pain.  He is scheduled for follow-up with orthopedics soon.  He has had a prior left total hip replacement surgery and now is having right hip pain. He is followed by urology semiannually due to prostate cancer.  He is status post treatment with seed implantation.  He still works 3 days/week.  He has a history of colonic polyps and did have a follow-up colonoscopy January 2017  Flu vaccine given today Past Medical History:  Diagnosis Date  . Asthma   . Elevated PSA   . Hyperlipidemia    pt says not anymore  . Hypertension   . Prostate cancer [facility]) 2013     Social History   Socioeconomic History  . Marital status: Married    Spouse name: Not on file  . Number of children: Not on file  . Years of education: Not on file  . Highest education level: Not on file  Social Needs  . Financial resource strain: Not on file  . Food insecurity - worry: Not on file  . Food insecurity - inability: Not on file  . Transportation needs - medical: Not on file  . Transportation needs - non-medical: Not on file  Occupational History  . Not on file  Tobacco Use  . Smoking status: Former Smoker    Packs/day: 1.00    Years: 32.00    Pack years: 32.00    Types: Cigarettes    Last attempt to quit: 06/06/1999    Years since quitting: 18.3  . Smokeless tobacco: Never Used  Substance and Sexual Activity  . Alcohol use: Yes    Alcohol/week: 8.4 oz    Types: 14 Shots of liquor per week  . Drug use: No  . Sexual activity: Yes  Other Topics Concern  . Not on file  Social History Narrative  . Not on file    Past Surgical History:  Procedure Laterality Date  . bilateral inguinal hernia  2008  . CYST REMOVAL TRUNK  2016   sebaceous cyst    . KNEE ARTHROSCOPY  2007   right  . PROSTATE BIOPSY  2008  . PROSTATE BIOPSY  2013  . PROSTATE BIOPSY  01/28/2016  . TOTAL HIP ARTHROPLASTY  2001   left    Family History  Problem Relation Age of Onset  . Colon cancer Neg Hx   . Pancreatic cancer Mother   . Heart disease Father     Allergies  Allergen Reactions  . Povidone-Iodine     REACTION: rash  . Skin Protectants, Misc. Rash    PCMX surgical sterilizing scrub    Current Outpatient Medications on File Prior to Visit  Medication Sig Dispense Refill  . Krill Oil 300 MG CAPS Take 1 capsule by mouth daily.    Marland Kitchen lisinopril-hydrochlorothiazide (PRINZIDE,ZESTORETIC) 20-25 MG tablet TAKE 1 TABLET BY MOUTH DAILY 90 tablet 0  . multivitamin (THERAGRAN) per tablet Take 1 tablet by mouth daily.      . Potassium Gluconate 550 MG TABS Take 1 tablet by mouth daily.    . sildenafil (VIAGRA) 100 MG tablet Take 100 mg by mouth daily as needed.      . tamsulosin (FLOMAX) 0.4 MG CAPS capsule Take 0.4 mg by  mouth every other day.   2   No current facility-administered medications on file prior to visit.     BP (!) 142/62 (BP Location: Left Arm, Patient Position: Sitting, Cuff Size: Normal)   Pulse 68   Temp 98.2 F (36.8 C) (Oral)   Ht 5\' 8"  (1.727 m)   Wt 182 lb 6.4 oz (82.7 kg)   SpO2 99%   BMI 27.73 kg/m       Review of Systems  Constitutional: Negative for appetite change, chills, fatigue and fever.  HENT: Negative for congestion, dental problem, ear pain, hearing loss, sore throat, tinnitus, trouble swallowing and voice change.   Eyes: Negative for pain, discharge and visual disturbance.  Respiratory: Negative for cough, chest tightness, wheezing and stridor.   Cardiovascular: Negative for chest pain, palpitations and leg swelling.  Gastrointestinal: Negative for abdominal distention, abdominal pain, blood in stool, constipation, diarrhea, nausea and vomiting.  Genitourinary: Negative for difficulty urinating, discharge,  flank pain, genital sores, hematuria and urgency.  Musculoskeletal: Positive for arthralgias and gait problem. Negative for back pain, joint swelling, myalgias and neck stiffness.  Skin: Negative for rash.  Neurological: Negative for dizziness, syncope, speech difficulty, weakness, numbness and headaches.  Hematological: Negative for adenopathy. Does not bruise/bleed easily.  Psychiatric/Behavioral: Negative for behavioral problems and dysphoric mood. The patient is not nervous/anxious.        Objective:   Physical Exam  Constitutional: He appears well-developed and well-nourished.  HENT:  Head: Normocephalic and atraumatic.  Right Ear: External ear normal.  Left Ear: External ear normal.  Nose: Nose normal.  Mouth/Throat: Oropharynx is clear and moist.  Eyes: Conjunctivae and EOM are normal. Pupils are equal, round, and reactive to light. No scleral icterus.  Neck: Normal range of motion. Neck supple. No JVD present. No thyromegaly present.  Cardiovascular: Regular rhythm, normal heart sounds and intact distal pulses. Exam reveals no gallop and no friction rub.  No murmur heard. Pulmonary/Chest: Effort normal and breath sounds normal. He exhibits no tenderness.  Abdominal: Soft. Bowel sounds are normal. He exhibits no distension and no mass. There is no tenderness.  Genitourinary: Penis normal.  Genitourinary Comments: Prostate exam deferred  Musculoskeletal: Normal range of motion. He exhibits no edema or tenderness.  Lymphadenopathy:    He has no cervical adenopathy.  Neurological: He is alert. He has normal reflexes. No cranial nerve deficit. Coordination normal.  Skin: Skin is warm and dry. No rash noted.  Psychiatric: He has a normal mood and affect. His behavior is normal.          Assessment & Plan:   Preventive health examination History of dyslipidemia.  Now diet controlled Prostate cancer.  Follow-up urology History of colonic polyps.  Will continue 5-year  surveillance Osteoarthritis right hip.  Orthopedic follow-up as scheduled  Flu vaccine given Check updated lab Follow-up one year or as needed  Nyoka Cowden

## 2017-10-12 NOTE — Patient Instructions (Signed)
Limit your sodium (Salt) intake  Please check your blood pressure on a regular basis.  If it is consistently greater than 150/90, please make an office appointment.    It is important that you exercise regularly, at least 20 minutes 3 to 4 times per week.  If you develop chest pain or shortness of breath seek  medical attention.  Return in one year for follow-up  

## 2017-10-13 LAB — HEPATITIS C ANTIBODY
HEP C AB: NONREACTIVE
SIGNAL TO CUT-OFF: 0.03 (ref <1.00)

## 2017-10-14 ENCOUNTER — Encounter: Payer: BLUE CROSS/BLUE SHIELD | Admitting: Internal Medicine

## 2017-10-17 ENCOUNTER — Telehealth: Payer: Self-pay | Admitting: Internal Medicine

## 2017-10-17 NOTE — Telephone Encounter (Signed)
Pt states he needs results of labs done last Friday released to mychart asap in order to have results of PSA for appt with urology tomorrow.

## 2017-10-17 NOTE — Telephone Encounter (Signed)
Lab results were released to my chart. Called pt to make him aware.

## 2017-10-19 DIAGNOSIS — R3915 Urgency of urination: Secondary | ICD-10-CM | POA: Diagnosis not present

## 2017-10-19 DIAGNOSIS — C61 Malignant neoplasm of prostate: Secondary | ICD-10-CM | POA: Diagnosis not present

## 2017-10-19 DIAGNOSIS — N5201 Erectile dysfunction due to arterial insufficiency: Secondary | ICD-10-CM | POA: Diagnosis not present

## 2017-11-10 DIAGNOSIS — M1611 Unilateral primary osteoarthritis, right hip: Secondary | ICD-10-CM | POA: Diagnosis not present

## 2017-12-20 ENCOUNTER — Ambulatory Visit: Payer: Self-pay | Admitting: Orthopedic Surgery

## 2017-12-21 ENCOUNTER — Other Ambulatory Visit: Payer: Self-pay | Admitting: Internal Medicine

## 2018-01-11 NOTE — Patient Instructions (Signed)
Ruben Chavez  01/11/2018   Your procedure is scheduled on: 01-18-18  Report to [facility] Main  Entrance Report to Admitting at 2:30 PM   Call this number if you have problems the morning of surgery [phone]   Remember: Do not eat food or drink liquids :After Midnight. You may have a Clear Liquid Diet from Midnight until 11:00 AM. After 11:00 AM, nothing until after surgery     CLEAR LIQUID DIET   Foods Allowed                                                                     Foods Excluded  Coffee and tea, regular and decaf                             liquids that you cannot  Plain Jell-O in any flavor                                             see through such as: Fruit ices (not with fruit pulp)                                     milk, soups, orange juice  Iced Popsicles                                    All solid food Carbonated beverages, regular and diet                                    Cranberry, grape and apple juices Sports drinks like Gatorade Lightly seasoned clear broth or consume(fat free) Sugar, honey syrup  Sample Menu Breakfast                                Lunch                                     Supper Cranberry juice                    Beef broth                            Chicken broth Jell-O                                     Grape juice                           Apple juice Coffee  or tea                        Jell-O                                      Popsicle                                                Coffee or tea                        Coffee or tea  _____________________________________________________________________     Take these medicines the morning of surgery with A SIP OF WATER: You may use your prn pain medication as needed                                You may not have any metal on your body including hair pins and              piercings  Do not wear jewelry, lotions, powders, deodorant              Men may shave face and neck.   Do not bring valuables to the hospital. [place].  Contacts, dentures or bridgework may not be worn into surgery.  Leave suitcase in the car. After surgery it may be brought to your room.                 Please read over the following fact sheets you were given: _____________________________________________________________________         [facility] - Preparing for Surgery Before surgery, you can play an important role.  Because skin is not sterile, your skin needs to be as free of germs as possible.  You can reduce the number of germs on your skin by washing with CHG (chlorahexidine gluconate) soap before surgery.  CHG is an antiseptic cleaner which kills germs and bonds with the skin to continue killing germs even after washing. Please DO NOT use if you have an allergy to CHG or antibacterial soaps.  If your skin becomes reddened/irritated stop using the CHG and inform your nurse when you arrive at Short Stay. Do not shave (including legs and underarms) for at least 48 hours prior to the first CHG shower.  You may shave your face/neck. Please follow these instructions carefully:  1.  Shower with CHG Soap the night before surgery and the  morning of Surgery.  2.  If you choose to wash your hair, wash your hair first as usual with your  normal  shampoo.  3.  After you shampoo, rinse your hair and body thoroughly to remove the  shampoo.                           4.  Use CHG as you would any other liquid soap.  You can apply chg directly  to the skin and wash  Gently with a scrungie or clean washcloth.  5.  Apply the CHG Soap to your body ONLY FROM THE NECK DOWN.   Do not use on face/ open                           Wound or open sores. Avoid contact with eyes, ears mouth and genitals (private parts).                       Wash face,  Genitals (private parts) with your normal soap.              6.  Wash thoroughly, paying special attention to the area where your surgery  will be performed.  7.  Thoroughly rinse your body with warm water from the neck down.  8.  DO NOT shower/wash with your normal soap after using and rinsing off  the CHG Soap.                9.  Pat yourself dry with a clean towel.            10.  Wear clean pajamas.            11.  Place clean sheets on your bed the night of your first shower and do not  sleep with pets. Day of Surgery : Do not apply any lotions/deodorants the morning of surgery.  Please wear clean clothes to the hospital/surgery center.  FAILURE TO FOLLOW THESE INSTRUCTIONS MAY RESULT IN THE CANCELLATION OF YOUR SURGERY PATIENT SIGNATURE_________________________________  NURSE SIGNATURE__________________________________  ________________________________________________________________________   Adam Phenix  An incentive spirometer is a tool that can help keep your lungs clear and active. This tool measures how well you are filling your lungs with each breath. Taking long deep breaths may help reverse or decrease the chance of developing breathing (pulmonary) problems (especially infection) following:  A long period of time when you are unable to move or be active. BEFORE THE PROCEDURE   If the spirometer includes an indicator to show your best effort, your nurse or respiratory therapist will set it to a desired goal.  If possible, sit up straight or lean slightly forward. Try not to slouch.  Hold the incentive spirometer in an upright position. INSTRUCTIONS FOR USE  1. Sit on the edge of your bed if possible, or sit up as far as you can in bed or on a chair. 2. Hold the incentive spirometer in an upright position. 3. Breathe out normally. 4. Place the mouthpiece in your mouth and seal your lips tightly around it. 5. Breathe in slowly and as deeply as possible, raising the piston or the ball toward the top of the  column. 6. Hold your breath for 3-5 seconds or for as long as possible. Allow the piston or ball to fall to the bottom of the column. 7. Remove the mouthpiece from your mouth and breathe out normally. 8. Rest for a few seconds and repeat Steps 1 through 7 at least 10 times every 1-2 hours when you are awake. Take your time and take a few normal breaths between deep breaths. 9. The spirometer may include an indicator to show your best effort. Use the indicator as a goal to work toward during each repetition. 10. After each set of 10 deep breaths, practice coughing to be sure your lungs are clear. If you have an incision (the cut made at the time of  surgery), support your incision when coughing by placing a pillow or rolled up towels firmly against it. Once you are able to get out of bed, walk around indoors and cough well. You may stop using the incentive spirometer when instructed by your caregiver.  RISKS AND COMPLICATIONS  Take your time so you do not get dizzy or light-headed.  If you are in pain, you may need to take or ask for pain medication before doing incentive spirometry. It is harder to take a deep breath if you are having pain. AFTER USE  Rest and breathe slowly and easily.  It can be helpful to keep track of a log of your progress. Your caregiver can provide you with a simple table to help with this. If you are using the spirometer at home, follow these instructions: [place] IF:   You are having difficultly using the spirometer.  You have trouble using the spirometer as often as instructed.  Your pain medication is not giving enough relief while using the spirometer.  You develop fever of 100.5 F (38.1 C) or higher. SEEK IMMEDIATE MEDICAL CARE IF:   You cough up bloody sputum that had not been present before.  You develop fever of 102 F (38.9 C) or greater.  You develop worsening pain at or near the incision site. MAKE SURE YOU:   Understand these  instructions.  Will watch your condition.  Will get help right away if you are not doing well or get worse. Document Released: 04/04/2007 Document Revised: 02/14/2012 Document Reviewed: 06/05/2007 ExitCare Patient Information 2014 ExitCare, [place].   ________________________________________________________________________  WHAT IS A BLOOD TRANSFUSION? Blood Transfusion Information  A transfusion is the replacement of blood or some of its parts. Blood is made up of multiple cells which provide different functions.  Red blood cells carry oxygen and are used for blood loss replacement.  White blood cells fight against infection.  Platelets control bleeding.  Plasma helps clot blood.  Other blood products are available for specialized needs, such as hemophilia or other clotting disorders. BEFORE THE TRANSFUSION  Who gives blood for transfusions?   Healthy volunteers who are fully evaluated to make sure their blood is safe. This is blood bank blood. Transfusion therapy is the safest it has ever been in the practice of medicine. Before blood is taken from a donor, a complete history is taken to make sure that person has no history of diseases nor engages in risky social behavior (examples are intravenous drug use or sexual activity with multiple partners). The donor's travel history is screened to minimize risk of transmitting infections, such as malaria. The donated blood is tested for signs of infectious diseases, such as HIV and hepatitis. The blood is then tested to be sure it is compatible with you in order to minimize the chance of a transfusion reaction. If you or a relative donates blood, this is often done in anticipation of surgery and is not appropriate for emergency situations. It takes many days to process the donated blood. RISKS AND COMPLICATIONS Although transfusion therapy is very safe and saves many lives, the main dangers of transfusion include:   Getting an infectious  disease.  Developing a transfusion reaction. This is an allergic reaction to something in the blood you were given. Every precaution is taken to prevent this. The decision to have a blood transfusion has been considered carefully by your caregiver before blood is given. Blood is not given unless the benefits outweigh the risks. AFTER THE  TRANSFUSION  Right after receiving a blood transfusion, you will usually feel much better and more energetic. This is especially true if your red blood cells have gotten low (anemic). The transfusion raises the level of the red blood cells which carry oxygen, and this usually causes an energy increase.  The nurse administering the transfusion will monitor you carefully for complications. HOME CARE INSTRUCTIONS  No special instructions are needed after a transfusion. You may find your energy is better. Speak with your caregiver about any limitations on activity for underlying diseases you may have. SEEK MEDICAL CARE IF:   Your condition is not improving after your transfusion.  You develop redness or irritation at the intravenous (IV) site. SEEK IMMEDIATE MEDICAL CARE IF:  Any of the following symptoms occur over the next 12 hours:  Shaking chills.  You have a temperature by mouth above 102 F (38.9 C), not controlled by medicine.  Chest, back, or muscle pain.  People around you feel you are not acting correctly or are confused.  Shortness of breath or difficulty breathing.  Dizziness and fainting.  You get a rash or develop hives.  You have a decrease in urine output.  Your urine turns a dark color or changes to pink, red, or brown. Any of the following symptoms occur over the next 10 days:  You have a temperature by mouth above 102 F (38.9 C), not controlled by medicine.  Shortness of breath.  Weakness after normal activity.  The white part of the eye turns yellow (jaundice).  You have a decrease in the amount of urine or are  urinating less often.  Your urine turns a dark color or changes to pink, red, or brown. Document Released: 11/19/2000 Document Revised: 02/14/2012 Document Reviewed: 07/08/2008 [facility] Patient Information 2014 [place], [place].  _______________________________________________________________________

## 2018-01-11 NOTE — Progress Notes (Signed)
10-22-17 Surgical clearance from Dr. Inda Merlin on chart

## 2018-01-12 ENCOUNTER — Other Ambulatory Visit: Payer: Self-pay

## 2018-01-12 ENCOUNTER — Encounter (HOSPITAL_COMMUNITY)
Admission: RE | Admit: 2018-01-12 | Discharge: 2018-01-12 | Disposition: A | Discharge status: Home / Self Care | Payer: BLUE CROSS/BLUE SHIELD | Source: Ambulatory Visit | Attending: Orthopedic Surgery | Admitting: Orthopedic Surgery

## 2018-01-12 ENCOUNTER — Encounter (HOSPITAL_COMMUNITY): Payer: Self-pay

## 2018-01-12 DIAGNOSIS — M1611 Unilateral primary osteoarthritis, right hip: Secondary | ICD-10-CM | POA: Insufficient documentation

## 2018-01-12 DIAGNOSIS — Z01818 Encounter for other preprocedural examination: Secondary | ICD-10-CM | POA: Diagnosis not present

## 2018-01-12 DIAGNOSIS — I10 Essential (primary) hypertension: Secondary | ICD-10-CM | POA: Insufficient documentation

## 2018-01-12 LAB — SURGICAL PCR SCREEN
MRSA, PCR: NEGATIVE
Staphylococcus aureus: POSITIVE — AB

## 2018-01-12 LAB — PROTIME-INR
INR: 0.98
Prothrombin Time: 12.9 seconds (ref 11.4–15.2)

## 2018-01-12 LAB — CBC
HEMATOCRIT: 38.9 % — AB (ref 39.0–52.0)
Hemoglobin: 13.4 g/dL (ref 13.0–17.0)
MCH: 32.4 pg (ref 26.0–34.0)
MCHC: 34.4 g/dL (ref 30.0–36.0)
MCV: 94 fL (ref 78.0–100.0)
PLATELETS: 257 10*3/uL (ref 150–400)
RBC: 4.14 MIL/uL — ABNORMAL LOW (ref 4.22–5.81)
RDW: 13.3 % (ref 11.5–15.5)
WBC: 7.8 10*3/uL (ref 4.0–10.5)

## 2018-01-12 LAB — COMPREHENSIVE METABOLIC PANEL
ALBUMIN: 4.2 g/dL (ref 3.5–5.0)
ALT: 33 U/L (ref 17–63)
AST: 33 U/L (ref 15–41)
Alkaline Phosphatase: 42 U/L (ref 38–126)
Anion gap: 5 (ref 5–15)
BILIRUBIN TOTAL: 0.6 mg/dL (ref 0.3–1.2)
BUN: 27 mg/dL — ABNORMAL HIGH (ref 6–20)
CHLORIDE: 103 mmol/L (ref 101–111)
CO2: 28 mmol/L (ref 22–32)
Calcium: 9.4 mg/dL (ref 8.9–10.3)
Creatinine, Ser: 0.91 mg/dL (ref 0.61–1.24)
GFR calc Af Amer: >60 mL/min (ref >60)
GFR calc non Af Amer: >60 mL/min (ref >60)
GLUCOSE: 86 mg/dL (ref 65–99)
POTASSIUM: 3.9 mmol/L (ref 3.5–5.1)
SODIUM: 136 mmol/L (ref 135–145)
TOTAL PROTEIN: 7.4 g/dL (ref 6.5–8.1)

## 2018-01-12 LAB — ABO/RH: ABO/RH(D): O NEG

## 2018-01-12 LAB — APTT: APTT: 30 s (ref 24–36)

## 2018-01-12 NOTE — Progress Notes (Signed)
Pt allergic to Choloroxylenol. Spoke to pharmacy to verify if patient could be given Chlorhexidine Gluconate for pre-op shower. Pharmacist indicated that she was not familiar with Choloroxylenol. Because of the uncertainty of whether they are in the same medication category, pt requesting to shower with  soap and water.

## 2018-01-13 NOTE — Progress Notes (Signed)
cmet results faxed to dr Wynelle Link by epic

## 2018-01-15 ENCOUNTER — Ambulatory Visit: Payer: Self-pay | Admitting: Orthopedic Surgery

## 2018-01-15 NOTE — H&P (View-Only) (Signed)
Ruben Chavez, Ruben Chavez (62SL, M) DOB [birth year]   Chief Complaint Right Hip Pain H&P right THA 01/24/18  Patient's Care Team Primary Care Provider: Marletta Lor MD: [address] [place], [place], [place] [address], Ph [phone], Fax [phone] NPI: [id number] Patient's Pharmacies [place] [address] [facility]): [place], [place] [address], Ph [phone], Fax [phone]   Vitals Ht: 5 ft 11 in 01/03/2018 04:18 pm Wt: 180 lbs 01/03/2018 04:19 pm BMI: 25.1 01/03/2018 04:19 pm BP - 132/68 Pulse - 68   Allergies Reviewed Allergies BETADINE: Itching, Rash  [facility] Surgical Scrub    Medications Reviewed Medications HYDROcodone 5 mg-acetaminophen 325 mg tablet TK 1 T PO Q 6 H PRF MODERATE PAIN 10/12/17   filled surescripts krill oil 01/03/18   entered Avriana Joo, PA-C lisinopril 20 mg-hydrochlorothiazide 25 mg tablet TK 1 T PO D 12/21/17   filled PRIME multivitamin 01/03/18   entered Lenard Kampf, PA-C potassium 01/15/18   entered Andreus Cure, PA-C tamsulosin 0.4 mg capsule TK 1 C PO QOD 10/03/17   filled surescripts Tylenol 01/03/18   entered Solon Alban, PA-C   Problems Reviewed Problems Osteoarthritis of right hip joint     Family History Reviewed Family History Father - Father deceased   - Myocardial infarction Mother - Mother deceased   - Family history of cancer Brother - Prosthetic arthroplasty of the hip   Social History Reviewed Social History Smoking Status: Former smoker Non-smoker Tobacco-years of use: 30 Chewing tobacco: none Alcohol intake: Moderate Hand Dominance: Right Work related injury?: N Advance directive: N Medical Power of Attorney: N   Surgical History Reviewed Surgical History Total replacement of left hip joint - 2000 Other - 12/07/2015 Knee Surgery - 12/06/2008 Hernia Repair - 12/07/2007 - Bilateral Knee Arthroscopy - 12/07/2007    Past Medical  History Reviewed Past Medical History Cancer: Y - Prostate Hypertension: Y Joint Pain: Y Previous Cortisone Injection(s): Y Previous Fracture(s): Y Notes: Impaired Vision,  Hemorrhoids,  Childhood Measels,  Childhood Mumps     HPI Patient is a 67 year old male scheduled for a right total hip anterior approach by Dr. Wynelle Link on January 18, 2018 at [facility]. The patient is a 67 year old male who reports right hip problems including pain symptoms that have been present for 6 month(s), but has gotten much worse over the past 3 months. The symptoms began without any known injury. Symptoms reported include hip pain with weightbearing, severe with certain movements, and popping and clicking. The patient reports symptoms radiating to the: right thigh anteriorly. The patient feels as if their symptoms are worsening. He got his LEFT hip about 18 years ago and he has done great with that. In the past year or so he has had some pain in the RIGHT hip which has gotten progressively worse in the past 6 months. It is hurting at most times and limiting what he can and cannot do. He has lost a lot of motion in the hip. This hip now is as bad as the other one was prior to having it fixed. Radiographs, AP pelvis and lateral of the hip show the prosthesis on the LEFT remains in excellent position with no periprosthetic abnormalities. Remarkably there is minimal wear. The RIGHT hip shows bone-on-bone change throughout with a near ankylosed appearance. He has end-stage arthritis of that RIGHT hip with progressively worsening pain and dysfunction. At this point he is an excellent candidate for total hip arthroplasty. We discussed the differences between  the anterior approach and a posterior approach with regards to the procedure itself and postop pain in rehab. We discussed procedure risks and potential complications and he would like to go ahead and proceed with the RIGHT total hip  arthroplasty.   ROS Constitutional: Constitutional: no significant weight gain or loss and no fever.  HEENT: Eyes: no irritation, dry eyes, vision change, or sore throat.  Cardiovascular: Cardiovascular: no palpitations or chest pain.  Respiratory: Respiratory: no cough or shortness of breath and No COPD.  Gastrointestinal: Gastrointestinal: no vomiting or diarrhea and not vomiting blood.  Genitourinary: Genitourinary: nocturia and some painful uriantion.  Musculoskeletal: Musculoskeletal: Joint Pain; muscle pain, back pain.    Physical Exam Patient is a 67 year old male.  General Mental Status - Alert, cooperative and good historian. General Appearance - pleasant, Not in acute distress. Orientation - Oriented X3. Build & Nutrition - Well nourished and Well developed.  Head and Neck Head - normocephalic, atraumatic . Neck Global Assessment - supple, no bruit auscultated on the right, no bruit auscultated on the left.  Eye Pupil - Bilateral - PERR Motion - Bilateral - EOMI.  Chest and Lung Exam Auscultation Breath sounds - clear at anterior chest wall and clear at posterior chest wall. Adventitious sounds - No Adventitious sounds.  Cardiovascular Auscultation Rhythm - Regular rate and rhythm. Heart Sounds - S1 WNL and S2 WNL. Murmurs & Other Heart Sounds - Auscultation of the heart reveals - No Murmurs.  Abdomen Palpation/Percussion Tenderness - Abdomen is non-tender to palpation. Abdomen is soft. Auscultation Auscultation of the abdomen reveals - Bowel sounds normal.  Male Genitourinary Note: Not done, not pertinent to present illness  Musculoskeletal LEFT hip can be flexed to 120, rotated in 30, out 40, and abducted 40 without discomfort. RIGHT hip can be flexed to 100, with about 10 of internal rotation 20 of external rotation 20 of abduction. He has an antalgic gait pattern on the RIGHT. Pulses sensation and motor are intact.  Radiographs AP pelvis  lateral of the hip show the prosthesis on the LEFT remains in excellent position with no periprosthetic abnormalities. Remarkably there is minimal wear. The RIGHT hip shows bone-on-bone change throughout with a near ankylosed appearance.   Assessment / Plan 1. Osteoarthritis of right hip joint M16.11: Unilateral primary osteoarthritis, right hip  Patient Instructions Surgical Plans: Right Total Hip Replacement - Anterior Approach Disposition: Home, HEP PCP: Dr. Hermine Messick Topical TXA Anesthesia Issues: None Patient was instructed on what medications to stop prior to surgery. - Follow up visit in 2 weeks with Dr. Wynelle Link - Begin physical therapy following surgery - Pre-operative lab work as pre Pre-Surgical Testing - Prescriptions will be provided in hospital at time of discharge  Return to [place], MD for [place] at [facility] on 01/31/2018 at 03:45 PM  Encounter signed-off by Mickel Crow, PA-C

## 2018-01-15 NOTE — H&P (Signed)
STORM, SOVINE (67SW, M) DOB 1951/10/02   Chief Complaint Right Hip Pain H&P right THA 01/24/18  Patient's Care Team Primary Care Provider: Marletta Lor MD: [address] [place], [place], [place] [address], Ph [phone], Fax [phone] NPI: [id number] Patient[facility] Pharm[facility] [place] [address] [facility]): [place], [place] [address], Ph [phone], Fax [phone]   Vitals Ht: 5 ft 11 in 01/03/2018 04:18 pm Wt: 180 lbs 01/03/2018 04:19 pm BMI: 25.1 01/03/2018 04:19 pm BP - 132/68 Pulse - 68   Allergies Reviewed Allergies BETADINE: Itching, Rash  [facility] Surgical Scrub    Medications Reviewed Medications HYDROcodone 5 mg-acetaminophen 325 mg tablet TK 1 T PO Q 6 H PRF MODERATE PAIN 10/12/17   filled surescripts krill oil 01/03/18   entered Lucky Alverson, PA-C lisinopril 20 mg-hydrochlorothiazide 25 mg tablet TK 1 T PO D 12/21/17   filled PRIME multivitamin 01/03/18   entered Amna Welker, PA-C potassium 01/15/18   entered Lesly Pontarelli, PA-C tamsulosin 0.4 mg capsule TK 1 C PO QOD 10/03/17   filled surescripts Tylenol 01/03/18   entered Kiersten Coss, PA-C   Problems Reviewed Problems Osteoarthritis of right hip joint     Family History Reviewed Family History Father - Father deceased   - Myocardial infarction Mother - Mother deceased   - Family history of cancer Brother - Prosthetic arthroplasty of the hip   Social History Reviewed Social History Smoking Status: Former smoker Non-smoker Tobacco-years of use: 30 Chewing tobacco: none Alcohol intake: Moderate Hand Dominance: Right Work related injury?: N Advance directive: N Medical Power of Attorney: N   Surgical History Reviewed Surgical History Total replacement of left hip joint - 2000 Other - 12/07/2015 Knee Surgery - 12/06/2008 Hernia Repair - 12/07/2007 - Bilateral Knee Arthroscopy - 12/07/2007    Past Medical  History Reviewed Past Medical History Cancer: Y - Prostate Hypertension: Y Joint Pain: Y Previous Cortisone Injection(s): Y Previous Fracture(s): Y Notes: Impaired Vision,  Hemorrhoids,  Childhood Measels,  Childhood Mumps     HPI Patient is a 67 year old male scheduled for a right total hip anterior approach by Dr. Wynelle Link on January 18, 2018 at [facility]. The patient is a 67 year old male who reports right hip problems including pain symptoms that have been present for 6 month(s), but has gotten much worse over the past 3 months. The symptoms began without any known injury. Symptoms reported include hip pain with weightbearing, severe with certain movements, and popping and clicking. The patient reports symptoms radiating to the: right thigh anteriorly. The patient feels as if their symptoms are worsening. He got his LEFT hip about 18 years ago and he has done great with that. In the past year or so he has had some pain in the RIGHT hip which has gotten progressively worse in the past 6 months. It is hurting at most times and limiting what he can and cannot do. He has lost a lot of motion in the hip. This hip now is as bad as the other one was prior to having it fixed. Radiographs, AP pelvis and lateral of the hip show the prosthesis on the LEFT remains in excellent position with no periprosthetic abnormalities. Remarkably there is minimal wear. The RIGHT hip shows bone-on-bone change throughout with a near ankylosed appearance. He has end-stage arthritis of that RIGHT hip with progressively worsening pain and dysfunction. At this point he is an excellent candidate for total hip arthroplasty. We discussed the differences between  the anterior approach and a posterior approach with regards to the procedure itself and postop pain in rehab. We discussed procedure risks and potential complications and he would like to go ahead and proceed with the RIGHT total hip  arthroplasty.   ROS Constitutional: Constitutional: no significant weight gain or loss and no fever.  HEENT: Eyes: no irritation, dry eyes, vision change, or sore throat.  Cardiovascular: Cardiovascular: no palpitations or chest pain.  Respiratory: Respiratory: no cough or shortness of breath and No COPD.  Gastrointestinal: Gastrointestinal: no vomiting or diarrhea and not vomiting blood.  Genitourinary: Genitourinary: nocturia and some painful uriantion.  Musculoskeletal: Musculoskeletal: Joint Pain; muscle pain, back pain.    Physical Exam Patient is a 67 year old male.  General Mental Status - Alert, cooperative and good historian. General Appearance - pleasant, Not in acute distress. Orientation - Oriented X3. Build & Nutrition - Well nourished and Well developed.  Head and Neck Head - normocephalic, atraumatic . Neck Global Assessment - supple, no bruit auscultated on the right, no bruit auscultated on the left.  Eye Pupil - Bilateral - PERR Motion - Bilateral - EOMI.  Chest and Lung Exam Auscultation Breath sounds - clear at anterior chest wall and clear at posterior chest wall. Adventitious sounds - No Adventitious sounds.  Cardiovascular Auscultation Rhythm - Regular rate and rhythm. Heart Sounds - S1 WNL and S2 WNL. Murmurs & Other Heart Sounds - Auscultation of the heart reveals - No Murmurs.  Abdomen Palpation/Percussion Tenderness - Abdomen is non-tender to palpation. Abdomen is soft. Auscultation Auscultation of the abdomen reveals - Bowel sounds normal.  Male Genitourinary Note: Not done, not pertinent to present illness  Musculoskeletal LEFT hip can be flexed to 120, rotated in 30, out 40, and abducted 40 without discomfort. RIGHT hip can be flexed to 100, with about 10 of internal rotation 20 of external rotation 20 of abduction. He has an antalgic gait pattern on the RIGHT. Pulses sensation and motor are intact.  Radiographs AP pelvis  lateral of the hip show the prosthesis on the LEFT remains in excellent position with no periprosthetic abnormalities. Remarkably there is minimal wear. The RIGHT hip shows bone-on-bone change throughout with a near ankylosed appearance.   Assessment / Plan 1. Osteoarthritis of right hip joint M16.11: Unilateral primary osteoarthritis, right hip  Patient Instructions Surgical Plans: Right Total Hip Replacement - Anterior Approach Disposition: Home, HEP PCP: Dr. Hermine Messick Topical TXA Anesthesia Issues: None Patient was instructed on what medications to stop prior to surgery. - Follow up visit in 2 weeks with Dr. Wynelle Link - Begin physical therapy following surgery - Pre-operative lab work as pre Pre-Surgical Testing - Prescriptions will be provided in hospital at time of discharge  Return to [place], MD for Emily at [facility] on 01/31/2018 at 03:45 PM  Encounter signed-off by Mickel Crow, PA-C

## 2018-01-17 MED ORDER — TRANEXAMIC ACID 1000 MG/10ML IV SOLN
2000.0000 mg | Freq: Once | INTRAVENOUS | Status: DC
  Filled 2018-01-17: qty 20

## 2018-01-18 ENCOUNTER — Encounter (HOSPITAL_COMMUNITY)
Admission: RE | Disposition: A | Discharge status: Home / Self Care | Payer: Self-pay | Source: Ambulatory Visit | Attending: Orthopedic Surgery

## 2018-01-18 ENCOUNTER — Inpatient Hospital Stay (HOSPITAL_COMMUNITY): Payer: BLUE CROSS/BLUE SHIELD | Admitting: Certified Registered Nurse Anesthetist

## 2018-01-18 ENCOUNTER — Other Ambulatory Visit: Payer: Self-pay

## 2018-01-18 ENCOUNTER — Inpatient Hospital Stay (HOSPITAL_COMMUNITY)
Admission: RE | Admit: 2018-01-18 | Discharge: 2018-01-19 | DRG: 470 | Disposition: A | Discharge status: Home / Self Care | Payer: BLUE CROSS/BLUE SHIELD | Source: Ambulatory Visit | Attending: Orthopedic Surgery | Admitting: Orthopedic Surgery

## 2018-01-18 ENCOUNTER — Encounter (HOSPITAL_COMMUNITY): Payer: Self-pay | Admitting: Certified Registered Nurse Anesthetist

## 2018-01-18 ENCOUNTER — Inpatient Hospital Stay (HOSPITAL_COMMUNITY): Payer: BLUE CROSS/BLUE SHIELD

## 2018-01-18 DIAGNOSIS — Z79899 Other long term (current) drug therapy: Secondary | ICD-10-CM | POA: Diagnosis not present

## 2018-01-18 DIAGNOSIS — H547 Unspecified visual loss: Secondary | ICD-10-CM | POA: Diagnosis not present

## 2018-01-18 DIAGNOSIS — I10 Essential (primary) hypertension: Secondary | ICD-10-CM | POA: Diagnosis present

## 2018-01-18 DIAGNOSIS — Z96642 Presence of left artificial hip joint: Secondary | ICD-10-CM | POA: Diagnosis present

## 2018-01-18 DIAGNOSIS — C61 Malignant neoplasm of prostate: Secondary | ICD-10-CM | POA: Diagnosis not present

## 2018-01-18 DIAGNOSIS — Z8249 Family history of ischemic heart disease and other diseases of the circulatory system: Secondary | ICD-10-CM

## 2018-01-18 DIAGNOSIS — Z87891 Personal history of nicotine dependence: Secondary | ICD-10-CM

## 2018-01-18 DIAGNOSIS — M1611 Unilateral primary osteoarthritis, right hip: Secondary | ICD-10-CM | POA: Diagnosis not present

## 2018-01-18 DIAGNOSIS — Z471 Aftercare following joint replacement surgery: Secondary | ICD-10-CM | POA: Diagnosis not present

## 2018-01-18 DIAGNOSIS — Z8546 Personal history of malignant neoplasm of prostate: Secondary | ICD-10-CM

## 2018-01-18 DIAGNOSIS — Z96641 Presence of right artificial hip joint: Secondary | ICD-10-CM | POA: Diagnosis not present

## 2018-01-18 DIAGNOSIS — M169 Osteoarthritis of hip, unspecified: Secondary | ICD-10-CM | POA: Diagnosis present

## 2018-01-18 DIAGNOSIS — Z96649 Presence of unspecified artificial hip joint: Secondary | ICD-10-CM

## 2018-01-18 DIAGNOSIS — E785 Hyperlipidemia, unspecified: Secondary | ICD-10-CM | POA: Diagnosis not present

## 2018-01-18 HISTORY — PX: TOTAL HIP ARTHROPLASTY: SHX124

## 2018-01-18 LAB — TYPE AND SCREEN
ABO/RH(D): O NEG
ANTIBODY SCREEN: NEGATIVE

## 2018-01-18 SURGERY — ARTHROPLASTY, HIP, TOTAL, ANTERIOR APPROACH
Anesthesia: General | Site: Hip | Laterality: Right

## 2018-01-18 MED ORDER — HYDROMORPHONE HCL 1 MG/ML IJ SOLN
INTRAMUSCULAR | Status: AC
  Administered 2018-01-18: 0.5 mg via INTRAVENOUS
  Filled 2018-01-18: qty 1

## 2018-01-18 MED ORDER — HYDROMORPHONE HCL 1 MG/ML IJ SOLN
0.5000 mg | INTRAMUSCULAR | Status: AC | PRN
  Administered 2018-01-18: 0.5 mg via INTRAVENOUS
  Administered 2018-01-18 (×2): 0.25 mg via INTRAVENOUS

## 2018-01-18 MED ORDER — METHOCARBAMOL 500 MG PO TABS
500.0000 mg | ORAL_TABLET | Freq: Four times a day (QID) | ORAL | Status: DC | PRN
  Administered 2018-01-18 – 2018-01-19 (×2): 500 mg via ORAL
  Filled 2018-01-18 (×2): qty 1

## 2018-01-18 MED ORDER — POTASSIUM 99 MG PO TABS: 99.0000 mg | ORAL_TABLET | Freq: Every day | ORAL | Status: DC

## 2018-01-18 MED ORDER — DEXAMETHASONE SODIUM PHOSPHATE 10 MG/ML IJ SOLN
10.0000 mg | Freq: Once | INTRAMUSCULAR | Status: AC
  Administered 2018-01-18: 10 mg via INTRAVENOUS

## 2018-01-18 MED ORDER — BISACODYL 10 MG RE SUPP: 10.0000 mg | Freq: Every day | RECTAL | Status: DC | PRN

## 2018-01-18 MED ORDER — HYDROMORPHONE HCL 1 MG/ML IJ SOLN
0.2500 mg | INTRAMUSCULAR | Status: DC | PRN
  Administered 2018-01-18 (×4): 0.5 mg via INTRAVENOUS

## 2018-01-18 MED ORDER — FLEET ENEMA 7-19 GM/118ML RE ENEM: 1.0000 | ENEMA | Freq: Once | RECTAL | Status: DC | PRN

## 2018-01-18 MED ORDER — FENTANYL CITRATE (PF) 100 MCG/2ML IJ SOLN
INTRAMUSCULAR | Status: DC | PRN
  Administered 2018-01-18: 50 ug via INTRAVENOUS
  Administered 2018-01-18: 100 ug via INTRAVENOUS
  Administered 2018-01-18: 50 ug via INTRAVENOUS
  Administered 2018-01-18: 100 ug via INTRAVENOUS

## 2018-01-18 MED ORDER — CHLORHEXIDINE GLUCONATE 4 % EX LIQD: 60.0000 mL | Freq: Once | CUTANEOUS | Status: DC

## 2018-01-18 MED ORDER — LIDOCAINE 2% (20 MG/ML) 5 ML SYRINGE
INTRAMUSCULAR | Status: DC | PRN
  Administered 2018-01-18: 60 mg via INTRAVENOUS

## 2018-01-18 MED ORDER — ONDANSETRON HCL 4 MG PO TABS: 4.0000 mg | ORAL_TABLET | Freq: Four times a day (QID) | ORAL | Status: DC | PRN

## 2018-01-18 MED ORDER — DOCUSATE SODIUM 100 MG PO CAPS
100.0000 mg | ORAL_CAPSULE | Freq: Two times a day (BID) | ORAL | Status: DC
  Administered 2018-01-18 – 2018-01-19 (×2): 100 mg via ORAL
  Filled 2018-01-18 (×2): qty 1

## 2018-01-18 MED ORDER — ACETAMINOPHEN 325 MG PO TABS: 650.0000 mg | ORAL_TABLET | ORAL | Status: DC | PRN

## 2018-01-18 MED ORDER — BUPIVACAINE-EPINEPHRINE (PF) 0.25% -1:200000 IJ SOLN
INTRAMUSCULAR | Status: DC | PRN
  Administered 2018-01-18: 30 mL

## 2018-01-18 MED ORDER — TAMSULOSIN HCL 0.4 MG PO CAPS: 0.4000 mg | ORAL_CAPSULE | ORAL | Status: DC

## 2018-01-18 MED ORDER — METOCLOPRAMIDE HCL 5 MG PO TABS: 5.0000 mg | ORAL_TABLET | Freq: Three times a day (TID) | ORAL | Status: DC | PRN

## 2018-01-18 MED ORDER — ONDANSETRON HCL 4 MG/2ML IJ SOLN: 4.0000 mg | Freq: Once | INTRAMUSCULAR | Status: DC | PRN

## 2018-01-18 MED ORDER — TRAMADOL HCL 50 MG PO TABS: 50.0000 mg | ORAL_TABLET | Freq: Four times a day (QID) | ORAL | Status: DC | PRN

## 2018-01-18 MED ORDER — CEFAZOLIN SODIUM-DEXTROSE 2-4 GM/100ML-% IV SOLN
2.0000 g | Freq: Four times a day (QID) | INTRAVENOUS | Status: AC
  Administered 2018-01-18 – 2018-01-19 (×2): 2 g via INTRAVENOUS
  Filled 2018-01-18 (×2): qty 100

## 2018-01-18 MED ORDER — POLYETHYLENE GLYCOL 3350 17 G PO PACK: 17.0000 g | PACK | Freq: Every day | ORAL | Status: DC | PRN

## 2018-01-18 MED ORDER — SODIUM CHLORIDE 0.9 % IV SOLN
INTRAVENOUS | Status: DC
  Administered 2018-01-18: 1000 mL via INTRAVENOUS
  Administered 2018-01-19: 03:00:00 via INTRAVENOUS

## 2018-01-18 MED ORDER — FENTANYL CITRATE (PF) 100 MCG/2ML IJ SOLN
25.0000 ug | INTRAMUSCULAR | Status: DC | PRN
  Administered 2018-01-18: 50 ug via INTRAVENOUS

## 2018-01-18 MED ORDER — METHOCARBAMOL 1000 MG/10ML IJ SOLN
500.0000 mg | Freq: Four times a day (QID) | INTRAVENOUS | Status: DC | PRN
  Administered 2018-01-18: 500 mg via INTRAVENOUS
  Filled 2018-01-18: qty 550

## 2018-01-18 MED ORDER — TRANEXAMIC ACID 1000 MG/10ML IV SOLN
INTRAVENOUS | Status: AC | PRN
  Administered 2018-01-18: 2000 mg via TOPICAL

## 2018-01-18 MED ORDER — FENTANYL CITRATE (PF) 100 MCG/2ML IJ SOLN
INTRAMUSCULAR | Status: AC
  Administered 2018-01-18: 50 ug via INTRAVENOUS
  Filled 2018-01-18: qty 2

## 2018-01-18 MED ORDER — ACETAMINOPHEN 650 MG RE SUPP: 650.0000 mg | RECTAL | Status: DC | PRN

## 2018-01-18 MED ORDER — SUGAMMADEX SODIUM 200 MG/2ML IV SOLN
INTRAVENOUS | Status: DC | PRN
  Administered 2018-01-18: 200 mg via INTRAVENOUS

## 2018-01-18 MED ORDER — OXYCODONE HCL 5 MG PO TABS
10.0000 mg | ORAL_TABLET | ORAL | Status: DC | PRN
  Administered 2018-01-18 – 2018-01-19 (×3): 10 mg via ORAL
  Filled 2018-01-18 (×3): qty 2

## 2018-01-18 MED ORDER — ROCURONIUM BROMIDE 10 MG/ML (PF) SYRINGE
PREFILLED_SYRINGE | INTRAVENOUS | Status: DC | PRN
  Administered 2018-01-18: 50 mg via INTRAVENOUS
  Administered 2018-01-18 (×2): 10 mg via INTRAVENOUS

## 2018-01-18 MED ORDER — BUPIVACAINE-EPINEPHRINE 0.25% -1:200000 IJ SOLN
INTRAMUSCULAR | Status: AC
  Filled 2018-01-18: qty 1

## 2018-01-18 MED ORDER — ACETAMINOPHEN 500 MG PO TABS
1000.0000 mg | ORAL_TABLET | Freq: Four times a day (QID) | ORAL | Status: DC
  Administered 2018-01-18 – 2018-01-19 (×3): 1000 mg via ORAL
  Filled 2018-01-18 (×3): qty 2

## 2018-01-18 MED ORDER — LACTATED RINGERS IV SOLN
INTRAVENOUS | Status: DC
  Administered 2018-01-18 (×2): via INTRAVENOUS

## 2018-01-18 MED ORDER — DIPHENHYDRAMINE HCL 12.5 MG/5ML PO ELIX: 12.5000 mg | ORAL_SOLUTION | ORAL | Status: DC | PRN

## 2018-01-18 MED ORDER — PHENOL 1.4 % MT LIQD: 1.0000 | OROMUCOSAL | Status: DC | PRN

## 2018-01-18 MED ORDER — OXYCODONE HCL 5 MG PO TABS
5.0000 mg | ORAL_TABLET | ORAL | Status: DC | PRN
  Administered 2018-01-18 – 2018-01-19 (×3): 5 mg via ORAL
  Filled 2018-01-18 (×3): qty 1

## 2018-01-18 MED ORDER — DEXAMETHASONE SODIUM PHOSPHATE 10 MG/ML IJ SOLN
10.0000 mg | Freq: Once | INTRAMUSCULAR | Status: AC
  Administered 2018-01-19: 10 mg via INTRAVENOUS
  Filled 2018-01-18: qty 1

## 2018-01-18 MED ORDER — 0.9 % SODIUM CHLORIDE (POUR BTL) OPTIME
TOPICAL | Status: DC | PRN
  Administered 2018-01-18: 1000 mL

## 2018-01-18 MED ORDER — METOCLOPRAMIDE HCL 5 MG/ML IJ SOLN: 5.0000 mg | Freq: Three times a day (TID) | INTRAMUSCULAR | Status: DC | PRN

## 2018-01-18 MED ORDER — MENTHOL 3 MG MT LOZG: 1.0000 | LOZENGE | OROMUCOSAL | Status: DC | PRN

## 2018-01-18 MED ORDER — FENTANYL CITRATE (PF) 250 MCG/5ML IJ SOLN
INTRAMUSCULAR | Status: AC
  Filled 2018-01-18: qty 5

## 2018-01-18 MED ORDER — PROPOFOL 10 MG/ML IV BOLUS
INTRAVENOUS | Status: DC | PRN
  Administered 2018-01-18: 40 mg via INTRAVENOUS
  Administered 2018-01-18: 160 mg via INTRAVENOUS

## 2018-01-18 MED ORDER — EPHEDRINE SULFATE-NACL 50-0.9 MG/10ML-% IV SOSY
PREFILLED_SYRINGE | INTRAVENOUS | Status: DC | PRN
Start: 2018-01-18 — End: 2018-01-18
  Administered 2018-01-18: 15 mg via INTRAVENOUS
  Administered 2018-01-18: 10 mg via INTRAVENOUS

## 2018-01-18 MED ORDER — PROPOFOL 10 MG/ML IV BOLUS
INTRAVENOUS | Status: AC
  Filled 2018-01-18: qty 60

## 2018-01-18 MED ORDER — CEFAZOLIN SODIUM-DEXTROSE 2-4 GM/100ML-% IV SOLN
2.0000 g | INTRAVENOUS | Status: AC
  Administered 2018-01-18: 2 g via INTRAVENOUS
  Filled 2018-01-18: qty 100

## 2018-01-18 MED ORDER — ONDANSETRON HCL 4 MG/2ML IJ SOLN
INTRAMUSCULAR | Status: DC | PRN
  Administered 2018-01-18: 4 mg via INTRAVENOUS

## 2018-01-18 MED ORDER — ONDANSETRON HCL 4 MG/2ML IJ SOLN: 4.0000 mg | Freq: Four times a day (QID) | INTRAMUSCULAR | Status: DC | PRN

## 2018-01-18 MED ORDER — ACETAMINOPHEN 10 MG/ML IV SOLN
1000.0000 mg | Freq: Once | INTRAVENOUS | Status: AC
  Administered 2018-01-18: 1000 mg via INTRAVENOUS
  Filled 2018-01-18: qty 100

## 2018-01-18 MED ORDER — RIVAROXABAN 10 MG PO TABS
10.0000 mg | ORAL_TABLET | Freq: Every day | ORAL | Status: DC
  Administered 2018-01-19: 10 mg via ORAL
  Filled 2018-01-18: qty 1

## 2018-01-18 MED ORDER — STERILE WATER FOR IRRIGATION IR SOLN
Status: DC | PRN
  Administered 2018-01-18: 2000 mL

## 2018-01-18 MED ORDER — MORPHINE SULFATE (PF) 4 MG/ML IV SOLN: 1.0000 mg | INTRAVENOUS | Status: DC | PRN

## 2018-01-18 MED ORDER — FENTANYL CITRATE (PF) 100 MCG/2ML IJ SOLN
INTRAMUSCULAR | Status: AC
  Filled 2018-01-18: qty 2

## 2018-01-18 SURGICAL SUPPLY — 39 items
BAG DECANTER FOR FLEXI CONT (MISCELLANEOUS) ×3 IMPLANT
BLADE SAG 18X100X1.27 (BLADE) ×3 IMPLANT
CAPT HIP TOTAL 2 ×2 IMPLANT
CLOSURE WOUND 1/2 X4 (GAUZE/BANDAGES/DRESSINGS) ×1
CLOTH BEACON ORANGE TIMEOUT ST (SAFETY) ×3 IMPLANT
COVER PERINEAL POST (MISCELLANEOUS) ×3 IMPLANT
COVER SURGICAL LIGHT HANDLE (MISCELLANEOUS) ×3 IMPLANT
DECANTER SPIKE VIAL GLASS SM (MISCELLANEOUS) ×3 IMPLANT
DRAPE STERI IOBAN 125X83 (DRAPES) ×3 IMPLANT
DRAPE U-SHAPE 47X51 STRL (DRAPES) ×6 IMPLANT
DRSG ADAPTIC 3X8 NADH LF (GAUZE/BANDAGES/DRESSINGS) ×3 IMPLANT
DRSG MEPILEX BORDER 4X4 (GAUZE/BANDAGES/DRESSINGS) ×3 IMPLANT
DRSG MEPILEX BORDER 4X8 (GAUZE/BANDAGES/DRESSINGS) ×3 IMPLANT
DURAPREP 26ML APPLICATOR (WOUND CARE) ×3 IMPLANT
ELECT REM PT RETURN 15FT ADLT (MISCELLANEOUS) ×3 IMPLANT
EVACUATOR 1/8 PVC DRAIN (DRAIN) ×3 IMPLANT
GLOVE BIO SURGEON STRL SZ 6.5 (GLOVE) ×1 IMPLANT
GLOVE BIO SURGEON STRL SZ7.5 (GLOVE) ×3 IMPLANT
GLOVE BIO SURGEON STRL SZ8 (GLOVE) ×6 IMPLANT
GLOVE BIO SURGEONS STRL SZ 6.5 (GLOVE) ×1
GLOVE BIOGEL PI IND STRL 6.5 (GLOVE) IMPLANT
GLOVE BIOGEL PI IND STRL 7.5 (GLOVE) IMPLANT
GLOVE BIOGEL PI IND STRL 8 (GLOVE) ×2 IMPLANT
GLOVE BIOGEL PI INDICATOR 6.5 (GLOVE) ×2
GLOVE BIOGEL PI INDICATOR 7.5 (GLOVE) ×6
GLOVE BIOGEL PI INDICATOR 8 (GLOVE) ×4
GLOVE ECLIPSE 7.5 STRL STRAW (GLOVE) ×2 IMPLANT
GOWN STRL REUS W/ TWL XL LVL3 (GOWN DISPOSABLE) IMPLANT
GOWN STRL REUS W/TWL LRG LVL3 (GOWN DISPOSABLE) ×5 IMPLANT
GOWN STRL REUS W/TWL XL LVL3 (GOWN DISPOSABLE) ×6 IMPLANT
PACK ANTERIOR HIP CUSTOM (KITS) ×3 IMPLANT
STRIP CLOSURE SKIN 1/2X4 (GAUZE/BANDAGES/DRESSINGS) ×2 IMPLANT
SUT ETHIBOND NAB CT1 #1 30IN (SUTURE) ×3 IMPLANT
SUT MNCRL AB 4-0 PS2 18 (SUTURE) ×3 IMPLANT
SUT STRATAFIX 0 PDS 27 VIOLET (SUTURE) ×3
SUT VIC AB 2-0 CT1 27 (SUTURE) ×6
SUT VIC AB 2-0 CT1 TAPERPNT 27 (SUTURE) ×2 IMPLANT
SUTURE STRATFX 0 PDS 27 VIOLET (SUTURE) ×1 IMPLANT
YANKAUER SUCT BULB TIP 10FT TU (MISCELLANEOUS) ×3 IMPLANT

## 2018-01-18 NOTE — Transfer of Care (Signed)
Immediate Anesthesia Transfer of Care Note  Patient: Ruben Chavez  Procedure(s) Performed: RIGHT TOTAL HIP ARTHROPLASTY ANTERIOR APPROACH (Right Hip)  Patient Location: PACU  Anesthesia Type:General  Level of Consciousness: awake, alert  and oriented  Airway & Oxygen Therapy: Patient Spontanous Breathing and Patient connected to face mask oxygen  Post-op Assessment: Report given to RN and Post -op Vital signs reviewed and stable  Post vital signs: Reviewed and stable  Last Vitals:  Vitals:   01/18/18 1509  BP: (!) 158/78  Pulse: 77  Resp: 18  Temp: 36.9 C  SpO2: 100%    Last Pain:  Vitals:   01/18/18 1509  TempSrc: Oral      Patients Stated Pain Goal: 4 (03/88/82 8003)  Complications: No apparent anesthesia complications

## 2018-01-18 NOTE — Discharge Instructions (Addendum)
Dr. [name] Total Joint Specialist [facility] [address], [place], [place] [address] [phone]  ANTERIOR APPROACH TOTAL HIP REPLACEMENT POSTOPERATIVE DIRECTIONS   Hip Rehabilitation, Guidelines Following Surgery  The results of a hip operation are greatly improved after range of motion and muscle strengthening exercises. Follow all safety measures which are given to protect your hip. If any of these exercises cause increased pain or swelling in your joint, decrease the amount until you are comfortable again. Then slowly increase the exercises. Call your caregiver if you have problems or questions.   HOME CARE INSTRUCTIONS  Remove items at home which could result in a fall. This includes throw rugs or furniture in walking pathways.   ICE to the affected hip every three hours for 30 minutes at a time and then as needed for pain and swelling.  Continue to use ice on the hip for pain and swelling from surgery. You may notice swelling that will progress down to the foot and ankle.  This is normal after surgery.  Elevate the leg when you are not up walking on it.    Continue to use the breathing machine which will help keep your temperature down.  It is common for your temperature to cycle up and down following surgery, especially at night when you are not up moving around and exerting yourself.  The breathing machine keeps your lungs expanded and your temperature down.   DIET You may resume your previous home diet once your are discharged from the hospital.  DRESSING / WOUND CARE / SHOWERING You may shower 3 days after surgery, but keep the wounds dry during showering.  You may use an occlusive plastic wrap (Press'n Seal for example), NO SOAKING/SUBMERGING IN THE BATHTUB.  If the bandage gets wet, change with a clean dry gauze.  If the incision gets wet, pat the wound dry with a clean towel. You may start showering once you are discharged home but do not submerge  the incision under water. Just pat the incision dry and apply a dry gauze dressing on daily. Change the surgical dressing daily and reapply a dry dressing each time.  ACTIVITY Walk with your walker as instructed. Use walker as long as suggested by your caregivers. Avoid periods of inactivity such as sitting longer than an hour when not asleep. This helps prevent blood clots.  You may resume a sexual relationship in one month or when given the OK by your doctor.  You may return to work once you are cleared by your doctor.  Do not drive a car for 6 weeks or until released by you surgeon.  Do not drive while taking narcotics.  WEIGHT BEARING Weight bearing as tolerated with assist device (walker, cane, etc) as directed, use it as long as suggested by your surgeon or therapist, typically at least 4-6 weeks.  POSTOPERATIVE CONSTIPATION PROTOCOL Constipation - defined medically as fewer than three stools per week and severe constipation as less than one stool per week.  One of the most common issues patients have following surgery is constipation.  Even if you have a regular bowel pattern at home, your normal regimen is likely to be disrupted due to multiple reasons following surgery.  Combination of anesthesia, postoperative narcotics, change in appetite and fluid intake all can affect your bowels.  In order to avoid complications following surgery, here are some recommendations in order to help you during your recovery period.  Colace (docusate) - Pick up an over-the-counter  form of Colace or another stool softener and take twice a day as long as you are requiring postoperative pain medications.  Take with a full glass of water daily.  If you experience loose stools or diarrhea, hold the colace until you stool forms back up.  If your symptoms do not get better within 1 week or if they get worse, check with your doctor.  Dulcolax (bisacodyl) - Pick up over-the-counter and take as directed by the  product packaging as needed to assist with the movement of your bowels.  Take with a full glass of water.  Use this product as needed if not relieved by Colace only.   MiraLax (polyethylene glycol) - Pick up over-the-counter to have on hand.  MiraLax is a solution that will increase the amount of water in your bowels to assist with bowel movements.  Take as directed and can mix with a glass of water, juice, soda, coffee, or tea.  Take if you go more than two days without a movement. Do not use MiraLax more than once per day. Call your doctor if you are still constipated or irregular after using this medication for 7 days in a row.  If you continue to have problems with postoperative constipation, please contact the office for further assistance and recommendations.  If you experience "the worst abdominal pain ever" or develop nausea or vomiting, please contact the office immediatly for further recommendations for treatment.  ITCHING  If you experience itching with your medications, try taking only a single pain pill, or even half a pain pill at a time.  You can also use Benadryl over the counter for itching or also to help with sleep.   TED HOSE STOCKINGS Wear the elastic stockings on both legs for three weeks following surgery during the day but you may remove then at night for sleeping.  MEDICATIONS See your medication summary on the After Visit Summary that the nursing staff will review with you prior to discharge.  You may have some home medications which will be placed on hold until you complete the course of blood thinner medication.  It is important for you to complete the blood thinner medication as prescribed by your surgeon.  Continue your approved medications as instructed at time of discharge.  PRECAUTIONS If you experience chest pain or shortness of breath - call 911 immediately for transfer to the hospital emergency department.  If you develop a fever greater that 101 F, purulent  drainage from wound, increased redness or drainage from wound, foul odor from the wound/dressing, or calf pain - CONTACT YOUR SURGEON.                                                   FOLLOW-UP APPOINTMENTS Make sure you keep all of your appointments after your operation with your surgeon and caregivers. You should call the office at the above phone number and make an appointment for approximately two weeks after the date of your surgery or on the date instructed by your surgeon outlined in the "After Visit Summary".  RANGE OF MOTION AND STRENGTHENING EXERCISES  These exercises are designed to help you keep full movement of your hip joint. Follow your caregiver's or physical therapist's instructions. Perform all exercises about fifteen times, three times per day or as directed. Exercise both hips, even if you  have had only one joint replacement. These exercises can be done on a training (exercise) mat, on the floor, on a table or on a bed. Use whatever works the best and is most comfortable for you. Use music or television while you are exercising so that the exercises are a pleasant break in your day. This will make your life better with the exercises acting as a break in routine you can look forward to.  Lying on your back, slowly slide your foot toward your buttocks, raising your knee up off the floor. Then slowly slide your foot back down until your leg is straight again.  Lying on your back spread your legs as far apart as you can without causing discomfort.  Lying on your side, raise your upper leg and foot straight up from the floor as far as is comfortable. Slowly lower the leg and repeat.  Lying on your back, tighten up the muscle in the front of your thigh (quadriceps muscles). You can do this by keeping your leg straight and trying to raise your heel off the floor. This helps strengthen the largest muscle supporting your knee.  Lying on your back, tighten up the muscles of your buttocks both  with the legs straight and with the knee bent at a comfortable angle while keeping your heel on the floor.   IF YOU ARE TRANSFERRED TO A SKILLED REHAB FACILITY If the patient is transferred to a skilled rehab facility following release from the hospital, a list of the current medications will be sent to the facility for the patient to continue.  When discharged from the skilled rehab facility, please have the facility set up the patient's [place] prior to being released. Also, the skilled facility will be responsible for providing the patient with their medications at time of release from the facility to include their pain medication, the muscle relaxants, and their blood thinner medication. If the patient is still at the rehab facility at time of the two week follow up appointment, the skilled rehab facility will also need to assist the patient in arranging follow up appointment in our office and any transportation needs.  MAKE SURE YOU:  Understand these instructions.  Get help right away if you are not doing well or get worse.    Pick up stool softner and laxative for home use following surgery while on pain medications. Do not submerge incision under water. Please use good hand washing techniques while changing dressing each day. May shower starting three days after surgery. Please use a clean towel to pat the incision dry following showers. Continue to use ice for pain and swelling after surgery. Do not use any lotions or creams on the incision until instructed by your surgeon.  Take Xarelto for two and a half more weeks following discharge from the hospital, then discontinue Xarelto. Once the patient has completed the blood thinner regimen, then take a Baby 81 mg Aspirin daily for three more weeks.    Information on my medicine - XARELTO (Rivaroxaban)  Why was Xarelto prescribed for you? Xarelto was prescribed for you to reduce the risk of blood clots forming  after orthopedic surgery. The medical term for these abnormal blood clots is venous thromboembolism (VTE).  What do you need to know about xarelto ? Take your Xarelto ONCE DAILY at the same time every day. You may take it either with or without food.  If you have difficulty swallowing the tablet whole, you may crush  crush it and mix in applesauce just prior to taking your dose. ° °Take Xarelto® exactly as prescribed by your doctor and DO NOT stop taking Xarelto® without talking to the doctor who prescribed the medication.  Stopping without other VTE prevention medication to take the place of Xarelto® may increase your risk of developing a clot. ° °After discharge, you should have regular check-up appointments with your healthcare provider that is prescribing your Xarelto®.   ° °What do you do if you miss a dose? °If you miss a dose, take it as soon as you remember on the same day then continue your regularly scheduled once daily regimen the next day. Do not take two doses of Xarelto® on the same day.  ° °Important Safety Information °A possible side effect of Xarelto® is bleeding. You should call your healthcare provider right away if you experience any of the following: °? Bleeding from an injury or your nose that does not stop. °? Unusual colored urine (red or dark brown) or unusual colored stools (red or black). °? Unusual bruising for unknown reasons. °? A serious fall or if you hit your head (even if there is no bleeding). ° °Some medicines may interact with Xarelto® and might increase your risk of bleeding while on Xarelto®. To help avoid this, consult your healthcare provider or pharmacist prior to using any new prescription or non-prescription medications, including herbals, vitamins, non-steroidal anti-inflammatory drugs (NSAIDs) and supplements. ° °This website has more information on Xarelto®: www.xarelto.com. ° ° ° °

## 2018-01-18 NOTE — Interval H&P Note (Signed)
History and Physical Interval Note:  01/18/2018 3:55 PM  Ruben Chavez  has presented today for surgery, with the diagnosis of Osteoarthritis Right Hip  The various methods of treatment have been discussed with the patient and family. After consideration of risks, benefits and other options for treatment, the patient has consented to  Procedure(s): RIGHT TOTAL HIP ARTHROPLASTY ANTERIOR APPROACH (Right) as a surgical intervention .  The patient's history has been reviewed, patient examined, no change in status, stable for surgery.  I have reviewed the patient's chart and labs.  Questions were answered to the patient's satisfaction.     Pilar Plate Marily Konczal

## 2018-01-18 NOTE — Anesthesia Procedure Notes (Signed)
Procedure Name: Intubation Performed by: Gean Maidens, CRNA Pre-anesthesia Checklist: Patient identified, Emergency Drugs available, Suction available, Patient being monitored and Timeout performed Patient Re-evaluated:Patient Re-evaluated prior to induction Oxygen Delivery Method: Circle system utilized Preoxygenation: Pre-oxygenation with 100% oxygen Induction Type: IV induction Ventilation: Mask ventilation without difficulty Laryngoscope Size: Mac and 3 Grade View: Grade I Tube type: Oral Tube size: 7.5 mm Number of attempts: 1 Airway Equipment and Method: Stylet Placement Confirmation: ETT inserted through vocal cords under direct vision,  positive ETCO2,  CO2 detector and breath sounds checked- equal and bilateral Secured at: 23 cm Tube secured with: Tape Dental Injury: Teeth and Oropharynx as per pre-operative assessment

## 2018-01-18 NOTE — Anesthesia Preprocedure Evaluation (Addendum)
Anesthesia Evaluation  Patient identified by MRN, date of birth, ID band Patient awake    Reviewed: Allergy & Precautions, NPO status , Patient's Chart, lab work & pertinent test results  Airway Mallampati: II  TM Distance: >3 FB Neck ROM: Full    Dental  (+) Teeth Intact, Dental Advisory Given, Implants, Missing, Caps   Pulmonary former smoker,    Pulmonary exam normal breath sounds clear to auscultation       Cardiovascular hypertension, Pt. on medications (-) angina(-) CAD and (-) Past MI Normal cardiovascular exam Rhythm:Regular Rate:Normal     Neuro/Psych negative neurological ROS  negative psych ROS   GI/Hepatic negative GI ROS, Neg liver ROS,   Endo/Other  negative endocrine ROS  Renal/GU negative Renal ROS     Musculoskeletal  (+) Arthritis , Osteoarthritis,    Abdominal   Peds  Hematology Plt 257k   Anesthesia Other Findings Day of surgery medications reviewed with the patient.  Reproductive/Obstetrics                            Anesthesia Physical Anesthesia Plan  ASA: II  Anesthesia Plan: General   Post-op Pain Management:    Induction: Intravenous  PONV Risk Score and Plan: 2 and Midazolam, Dexamethasone and Ondansetron  Airway Management Planned: Oral ETT  Additional Equipment:   Intra-op Plan:   Post-operative Plan: Extubation in OR  Informed Consent: I have reviewed the patients History and Physical, chart, labs and discussed the procedure including the risks, benefits and alternatives for the proposed anesthesia with the patient or authorized representative who has indicated his/her understanding and acceptance.   Dental advisory given  Plan Discussed with: CRNA  Anesthesia Plan Comments: (Risks/benefits of general anesthesia discussed with patient including risk of damage to teeth, lips, gum, and tongue, nausea/vomiting, allergic reactions to medications,  and the possibility of heart attack, stroke and death.  All patient questions answered.  Patient wishes to proceed.)       Anesthesia Quick Evaluation

## 2018-01-18 NOTE — Op Note (Signed)
OPERATIVE REPORT- TOTAL HIP ARTHROPLASTY   PREOPERATIVE DIAGNOSIS: Osteoarthritis of the Right hip.   POSTOPERATIVE DIAGNOSIS: Osteoarthritis of the Right  hip.   PROCEDURE: Right total hip arthroplasty, anterior approach.   SURGEON: [name], MD   ASSISTANT: Arlee Muslim, PA-C  ANESTHESIA:  Spinal  ESTIMATED BLOOD LOSS:-500 mL    DRAINS: Hemovac x1.   COMPLICATIONS: None   CONDITION: PACU - hemodynamically stable.   BRIEF CLINICAL NOTE: Ruben Chavez is a 67 y.o. male who has advanced end-  stage arthritis of their Right  hip with progressively worsening pain and  dysfunction.The patient has failed nonoperative management and presents for  total hip arthroplasty.   PROCEDURE IN DETAIL: After successful administration of spinal  anesthetic, the traction boots for the [facility] bed were placed on both  feet and the patient was placed onto the [facility] bed, boots placed into the leg  holders. The Right hip was then isolated from the perineum with plastic  drapes and prepped and draped in the usual sterile fashion. ASIS and  greater trochanter were marked and a oblique incision was made, starting  at about 1 cm lateral and 2 cm distal to the ASIS and coursing towards  the anterior cortex of the femur. The skin was cut with a 10 blade  through subcutaneous tissue to the level of the fascia overlying the  tensor fascia lata muscle. The fascia was then incised in line with the  incision at the junction of the anterior third and posterior 2/3rd. The  muscle was teased off the fascia and then the interval between the TFL  and the rectus was developed. The Hohmann retractor was then placed at  the top of the femoral neck over the capsule. The vessels overlying the  capsule were cauterized and the fat on top of the capsule was removed.  A Hohmann retractor was then placed anterior underneath the rectus  femoris to give exposure to the entire anterior capsule. A T-shaped   capsulotomy was performed. The edges were tagged and the femoral head  was identified.       Osteophytes are removed off the superior acetabulum.  The femoral neck was then cut in situ with an oscillating saw. Traction  was then applied to the left lower extremity utilizing the [facility]  traction. The femoral head was then removed. Retractors were placed  around the acetabulum and then circumferential removal of the labrum was  performed. Osteophytes were also removed. Reaming starts at 49 mm to  medialize and  Increased in 2 mm increments to 55 mm. We reamed in  approximately 40 degrees of abduction, 20 degrees anteversion. A 56 mm  pinnacle acetabular shell was then impacted in anatomic position under  fluoroscopic guidance with excellent purchase. We did not need to place  any additional dome screws. A 36 mm neutral + 4 marathon liner was then  placed into the acetabular shell.       The femoral lift was then placed along the lateral aspect of the femur  just distal to the vastus ridge. The leg was  externally rotated and capsule  was stripped off the inferior aspect of the femoral neck down to the  level of the lesser trochanter, this was done with electrocautery. The femur was lifted after this was performed. The  leg was then placed in an extended and adducted position essentially delivering the femur. We also removed the capsule superiorly and the piriformis from the piriformis  fossa to gain excellent exposure of the  proximal femur. Rongeur was used to remove some cancellous bone to get  into the lateral portion of the proximal femur for placement of the  initial starter reamer. The starter broaches was placed  the starter broach  and was shown to go down the center of the canal. Broaching  with the  Corail system was then performed starting at size 8, coursing  Up to size 11. A size 11 had excellent torsional and rotational  and axial stability. The trial high offset neck was then  placed  with a 36 + 8.5 trial head. The hip was then reduced. We confirmed that  the stem was in the canal both on AP and lateral x-rays. It also has excellent sizing. The hip was reduced with outstanding stability through full extension and full external rotation.. AP pelvis was taken and the leg lengths were measured and found to be equal. Hip was then dislocated again and the femoral head and neck removed. The  femoral broach was removed. Size 11 Corail stem with a high offset  neck was then impacted into the femur following native anteversion. Has  excellent purchase in the canal. Excellent torsional and rotational and  axial stability. It is confirmed to be in the canal on AP and lateral  fluoroscopic views. The 36 + 8.5 ceramic head was placed and the hip  reduced with outstanding stability. Again AP pelvis was taken and it  confirmed that the leg lengths were equal. The wound was then copiously  irrigated with saline solution and the capsule reattached and repaired  with Ethibond suture. 30 ml of .25% Bupivicaine was  injected into the capsule and into the edge of the tensor fascia lata as well as subcutaneous tissue. The fascia overlying the tensor fascia lata was then closed with a running #1 V-Loc. Subcu was closed with interrupted 2-0 Vicryl and subcuticular running 4-0 Monocryl. Incision was cleaned  and dried. Steri-Strips and a bulky sterile dressing applied. Hemovac  drain was hooked to suction and then the patient was awakened and transported to  recovery in stable condition.        Please note that a surgical assistant was a medical necessity for this procedure to perform it in a safe and expeditious manner. Assistant was necessary to provide appropriate retraction of vital neurovascular structures and to prevent femoral fracture and allow for anatomic placement of the prosthesis.  [name], M.D.

## 2018-01-18 NOTE — Anesthesia Postprocedure Evaluation (Signed)
Anesthesia Post Note  Patient: Ruben Chavez  Procedure(s) Performed: RIGHT TOTAL HIP ARTHROPLASTY ANTERIOR APPROACH (Right Hip)     Patient location during evaluation: PACU Anesthesia Type: General Level of consciousness: awake and alert, oriented, patient cooperative and awake Pain management: pain level controlled Vital Signs Assessment: post-procedure vital signs reviewed and stable Respiratory status: spontaneous breathing, nonlabored ventilation, respiratory function stable and patient connected to nasal cannula oxygen Cardiovascular status: blood pressure returned to baseline and stable Postop Assessment: no apparent nausea or vomiting Anesthetic complications: no    Last Vitals:  Vitals:   01/18/18 1830 01/18/18 1845  BP: 133/71 136/78  Pulse: 70 71  Resp: 16 18  Temp: 36.9 C   SpO2: 100% 100%    Last Pain:  Vitals:   01/18/18 1845  TempSrc:   PainSc: 6                  Catalina Gravel

## 2018-01-19 ENCOUNTER — Encounter (HOSPITAL_COMMUNITY): Payer: Self-pay | Admitting: Orthopedic Surgery

## 2018-01-19 LAB — BASIC METABOLIC PANEL
Anion gap: 9 (ref 5–15)
BUN: 21 mg/dL — ABNORMAL HIGH (ref 6–20)
CALCIUM: 8.3 mg/dL — AB (ref 8.9–10.3)
CO2: 24 mmol/L (ref 22–32)
CREATININE: 0.83 mg/dL (ref 0.61–1.24)
Chloride: 103 mmol/L (ref 101–111)
Glucose, Bld: 168 mg/dL — ABNORMAL HIGH (ref 65–99)
Potassium: 4 mmol/L (ref 3.5–5.1)
Sodium: 136 mmol/L (ref 135–145)

## 2018-01-19 LAB — CBC
HCT: 28.4 % — ABNORMAL LOW (ref 39.0–52.0)
Hemoglobin: 10.1 g/dL — ABNORMAL LOW (ref 13.0–17.0)
MCH: 33.4 pg (ref 26.0–34.0)
MCHC: 35.6 g/dL (ref 30.0–36.0)
MCV: 94 fL (ref 78.0–100.0)
PLATELETS: 216 10*3/uL (ref 150–400)
RBC: 3.02 MIL/uL — ABNORMAL LOW (ref 4.22–5.81)
RDW: 13.3 % (ref 11.5–15.5)
WBC: 10.2 10*3/uL (ref 4.0–10.5)

## 2018-01-19 MED ORDER — METHOCARBAMOL 500 MG PO TABS: 500.0000 mg | ORAL_TABLET | Freq: Four times a day (QID) | ORAL | 0 refills | Status: DC | PRN

## 2018-01-19 MED ORDER — OXYCODONE HCL 5 MG PO TABS: 5.0000 mg | ORAL_TABLET | ORAL | 0 refills | Status: DC | PRN

## 2018-01-19 MED ORDER — RIVAROXABAN 10 MG PO TABS: 10.0000 mg | ORAL_TABLET | Freq: Every day | ORAL | 0 refills | Status: DC

## 2018-01-19 MED ORDER — SODIUM CHLORIDE 0.9 % IV BOLUS (SEPSIS)
250.0000 mL | Freq: Once | INTRAVENOUS | Status: AC
  Administered 2018-01-19: 250 mL via INTRAVENOUS

## 2018-01-19 MED ORDER — TRAMADOL HCL 50 MG PO TABS: 50.0000 mg | ORAL_TABLET | Freq: Four times a day (QID) | ORAL | 0 refills | Status: DC | PRN

## 2018-01-19 NOTE — Progress Notes (Signed)
Physical Therapy Treatment Patient Details Name: Ruben Chavez MRN: [id number] DOB: 1951/03/09 Today's Date: 01/19/2018    History of Present Illness Pt s/p R THR and with hx of L THR in 2001    PT Comments    Pt progressing well with mobility and eager for dc home.  Reviewed stairs and car transfers.  Will return shortly for home therex program.   Follow Up Recommendations  Follow surgeon's recommendation for DC plan and follow-up therapies     Equipment Recommendations  None recommended by PT    Recommendations for Other Services       Precautions / Restrictions Precautions Precautions: Fall Restrictions Weight Bearing Restrictions: No Other Position/Activity Restrictions: WBAT    Mobility  Bed Mobility                  Transfers Overall transfer level: Needs assistance Equipment used: Rolling walker (2 wheeled) Transfers: Sit to/from Stand Sit to Stand: Supervision         General transfer comment: cues for LE management and use of UEs to self assist  Ambulation/Gait Ambulation/Gait assistance: Min guard;Supervision Ambulation Distance (Feet): 300 Feet Assistive device: Crutches Gait Pattern/deviations: Step-to pattern;Step-through pattern;Decreased step length - right;Decreased step length - left;Shuffle Gait velocity: decr Gait velocity interpretation: Below normal speed for age/gender General Gait Details: cues for sequence   Stairs Stairs: Yes   Stair Management: No rails;Step to pattern;One rail Left;Forwards;With crutches Number of Stairs: 8 General stair comments: 4 stairs with bil crutches and 4 stairs with crutch and rail  Wheelchair Mobility    Modified Rankin (Stroke Patients Only)       Balance Overall balance assessment: No apparent balance deficits (not formally assessed)                                          Cognition Arousal/Alertness: Awake/alert Behavior During Therapy: WFL for tasks  assessed/performed Overall Cognitive Status: Within Functional Limits for tasks assessed                                        Exercises      General Comments        Pertinent Vitals/Pain Pain Assessment: 0-10 Pain Score: 3  Pain Location: R hip Pain Descriptors / Indicators: Sore Pain Intervention(s): Limited activity within patient's tolerance;Monitored during session;Premedicated before session;Ice applied    Home Living                      Prior Function            PT Goals (current goals can now be found in the care plan section) Acute Rehab PT Goals Patient Stated Goal: Regain IND ASAP PT Goal Formulation: With patient Time For Goal Achievement: 01/21/18 Potential to Achieve Goals: Good Progress towards PT goals: Progressing toward goals    Frequency    7X/week      PT Plan Current plan remains appropriate    Co-evaluation              AM-PAC PT "6 Clicks" Daily Activity  Outcome Measure  Difficulty turning over in bed (including adjusting bedclothes, sheets and blankets)?: A Little Difficulty moving from lying on back to sitting on the side of the bed? : A Lot Difficulty  sitting down on and standing up from a chair with arms (e.g., wheelchair, bedside commode, etc,.)?: A Little Help needed moving to and from a bed to chair (including a wheelchair)?: A Little Help needed walking in hospital room?: A Little Help needed climbing 3-5 steps with a railing? : A Little 6 Click Score: 17    End of Session Equipment Utilized During Treatment: Gait belt Activity Tolerance: Patient tolerated treatment well Patient left: with call bell/phone within reach;Other (comment)(bathroom) Nurse Communication: Mobility status PT Visit Diagnosis: Difficulty in walking, not elsewhere classified (R26.2)     Time: 1120-1150 PT Time Calculation (min) (ACUTE ONLY): 30 min  Charges:  $Gait Training: 23-37 mins                    G  Codes:       Pg [phone]    Woodrow Drab 01/19/2018, 2:04 PM

## 2018-01-19 NOTE — Discharge Summary (Signed)
Physician Discharge Summary   Patient ID: Ruben Chavez MRN: [id number] DOB/AGE: 1951-07-16 67 y.o.  Admit date: 01/18/2018 Discharge date: 01-19-2017  Primary Diagnosis:  Osteoarthritis of the Right  hip.    Admission Diagnoses:  Past Medical History:  Diagnosis Date  . Elevated PSA   . Hyperlipidemia    pt says not anymore  . Hypertension   . Prostate cancer [facility]) 2013   Discharge Diagnoses:   Principal Problem:   OA (osteoarthritis) of hip  Estimated body mass index is 25.1 kg/m as calculated from the following:   Height as of this encounter: 5' 11" (1.803 m).   Weight as of this encounter: 81.6 kg (180 lb).  Procedure(s) (LRB): RIGHT TOTAL HIP ARTHROPLASTY ANTERIOR APPROACH (Right)   Consults: None  HPI: Ruben Chavez is a 67 y.o. male who has advanced end-  stage arthritis of their Right  hip with progressively worsening pain and  dysfunction.The patient has failed nonoperative management and presents for  total hip arthroplasty.    Laboratory Data: Admission on 01/18/2018  Component Date Value Ref Range Status  . WBC 01/19/2018 10.2  4.0 - 10.5 K/uL Final  . RBC 01/19/2018 3.02* 4.22 - 5.81 MIL/uL Final  . Hemoglobin 01/19/2018 10.1* 13.0 - 17.0 g/dL Final  . HCT 01/19/2018 28.4* 39.0 - 52.0 % Final  . MCV 01/19/2018 94.0  78.0 - 100.0 fL Final  . MCH 01/19/2018 33.4  26.0 - 34.0 pg Final  . MCHC 01/19/2018 35.6  30.0 - 36.0 g/dL Final  . RDW 01/19/2018 13.3  11.5 - 15.5 % Final  . Platelets 01/19/2018 216  150 - 400 K/uL Final   Performed at [facility], [place] [address] [place], [place] [address]  . Sodium 01/19/2018 136  135 - 145 mmol/L Final  . Potassium 01/19/2018 4.0  3.5 - 5.1 mmol/L Final  . Chloride 01/19/2018 103  101 - 111 mmol/L Final  . CO2 01/19/2018 24  22 - 32 mmol/L Final  . Glucose, Bld 01/19/2018 168* 65 - 99 mg/dL Final  . BUN 01/19/2018 21* 6 - 20 mg/dL Final  . Creatinine, Ser 01/19/2018 0.83  0.61 - 1.24  mg/dL Final  . Calcium 01/19/2018 8.3* 8.9 - 10.3 mg/dL Final  . GFR calc non Af Amer 01/19/2018 >60  >60 mL/min Final  . GFR calc Af Amer 01/19/2018 >60  >60 mL/min Final   Comment: (NOTE) The eGFR has been calculated using the CKD EPI equation. This calculation has not been validated in all clinical situations. eGFR's persistently <60 mL/min signify possible Chronic Kidney Disease.   Georgiann Hahn gap 01/19/2018 9  5 - 15 Final   Performed at [facility], [place] [address] [place], [place] [address]  Hospital Outpatient Visit on 01/12/2018  Component Date Value Ref Range Status  . aPTT 01/12/2018 30  24 - 36 seconds Final   Performed at [facility], [place] [address], [place], [place] [address]  . WBC 01/12/2018 7.8  4.0 - 10.5 K/uL Final  . RBC 01/12/2018 4.14* 4.22 - 5.81 MIL/uL Final  . Hemoglobin 01/12/2018 13.4  13.0 - 17.0 g/dL Final  . HCT 01/12/2018 38.9* 39.0 - 52.0 % Final  . MCV 01/12/2018 94.0  78.0 - 100.0 fL Final  . MCH 01/12/2018 32.4  26.0 - 34.0 pg Final  . MCHC 01/12/2018 34.4  30.0 - 36.0 g/dL Final  . RDW 01/12/2018 13.3  11.5 - 15.5 % Final  .  Platelets 01/12/2018 257  150 - 400 K/uL Final   Performed at [facility], [place] [address] [place], [place] [address]  . Sodium 01/12/2018 136  135 - 145 mmol/L Final  . Potassium 01/12/2018 3.9  3.5 - 5.1 mmol/L Final  . Chloride 01/12/2018 103  101 - 111 mmol/L Final  . CO2 01/12/2018 28  22 - 32 mmol/L Final  . Glucose, Bld 01/12/2018 86  65 - 99 mg/dL Final  . BUN 01/12/2018 27* 6 - 20 mg/dL Final  . Creatinine, Ser 01/12/2018 0.91  0.61 - 1.24 mg/dL Final  . Calcium 01/12/2018 9.4  8.9 - 10.3 mg/dL Final  . Total Protein 01/12/2018 7.4  6.5 - 8.1 g/dL Final  . Albumin 01/12/2018 4.2  3.5 - 5.0 g/dL Final  . AST 01/12/2018 33  15 - 41 U/L Final  . ALT 01/12/2018 33  17 - 63 U/L Final  . Alkaline Phosphatase 01/12/2018 42  38 - 126 U/L Final  . Total Bilirubin  01/12/2018 0.6  0.3 - 1.2 mg/dL Final  . GFR calc non Af Amer 01/12/2018 >60  >60 mL/min Final  . GFR calc Af Amer 01/12/2018 >60  >60 mL/min Final   Comment: (NOTE) The eGFR has been calculated using the CKD EPI equation. This calculation has not been validated in all clinical situations. eGFR's persistently <60 mL/min signify possible Chronic Kidney Disease.   Georgiann Hahn gap 01/12/2018 5  5 - 15 Final   Performed at [facility], [place] [address] [place], [place] [address]  . Prothrombin Time 01/12/2018 12.9  11.4 - 15.2 seconds Final  . INR 01/12/2018 0.98   Final   Performed at [facility], [place] [address] [place], [place] [address]  . ABO/RH(D) 01/12/2018 O NEG   Final  . Antibody Screen 01/12/2018 NEG   Final  . Sample Expiration 01/12/2018 01/21/2018   Final  . Extend sample reason 01/12/2018    Final                   Value:NO TRANSFUSIONS OR PREGNANCY IN THE PAST 3 MONTHS Performed at [facility], [place] [address] [place], [place] [address]   . MRSA, PCR 01/12/2018 NEGATIVE  NEGATIVE Final  . Staphylococcus aureus 01/12/2018 POSITIVE* NEGATIVE Final   Comment: (NOTE) The Xpert SA Assay (FDA approved for NASAL specimens in patients 30 years of age and older), is one component of a comprehensive surveillance program. It is not intended to diagnose infection nor to guide or monitor treatment. Performed at [facility], [place] [address], [place], [place] [address]   . ABO/RH(D) 01/12/2018    Final                   Value:O NEG Performed at [facility], [place] [address], [place], [place] [address]      X-Rays:Dg Pelvis Portable  Result Date: 01/18/2018 CLINICAL DATA:  67 y/o male status post right hip arthroplasty. EXAM: PORTABLE PELVIS 1-2 VIEWS COMPARISON:  Intraoperative fluoroscopic image at 1709 hr today. Prostate MRI 01/14/2016 FINDINGS: Postoperative AP portable view of the  pelvis at 1752 hrs. Preexisting left hip arthroplasty. Sequelae of prostate brachytherapy. Sequelae of pelvic hernia repair with mesh. Bipolar type right hip arthroplasty in place. Postoperative drain in place. Hardware appears intact and normally aligned in the AP projection. No unexpected osseous changes identified. IMPRESSION: Right hip arthroplasty with no adverse features. Preexisting left hip arthroplasty  and other postoperative/post treatment changes to the pelvis. Electronically Signed   By: Genevie Ann M.D.   On: 01/18/2018 18:28   Dg C-arm 1-60 Min-no Report  Result Date: 01/18/2018 Fluoroscopy was utilized by the requesting physician.  No radiographic interpretation.    EKG: Orders placed or performed during the hospital encounter of 01/12/18  . EKG 12 lead  . EKG 12 lead     Hospital Course: Patient was admitted to [facility] and taken to the OR and underwent the above state procedure without complications.  Patient tolerated the procedure well and was later transferred to the recovery room and then to the orthopaedic floor for postoperative care.  They were given PO and IV analgesics for pain control following their surgery.  They were given 24 hours of postoperative antibiotics of  Anti-infectives (From admission, onward)   Start     Dose/Rate Route Frequency Ordered Stop   01/18/18 2200  ceFAZolin (ANCEF) IVPB 2g/100 mL premix     2 g 200 mL/hr over 30 Minutes Intravenous Every 6 hours 01/18/18 1921 01/19/18 0334   01/18/18 1500  ceFAZolin (ANCEF) IVPB 2g/100 mL premix     2 g 200 mL/hr over 30 Minutes Intravenous On call to O.R. 01/18/18 1448 01/18/18 1634     and started on DVT prophylaxis in the form of Xarelto.   PT and OT were ordered for total hip protocol.  The patient was allowed to be WBAT with therapy. Discharge planning was consulted to help with postop disposition and equipment needs.  Patient had a good night on the evening of surgery.  They started to get up  OOB with therapy on day one.  Hemovac drain was pulled without difficulty. Dressing was checked and was clean and dry.  Patient was seen in rounds on day one by Dr. Wynelle Link and was ready to go home following therapy goals.  Diet - Cardiac diet Follow up - in 2 weeks Activity - WBAT Disposition - Home Condition Upon Discharge - stable D/C Meds - See DC Summary DVT Prophylaxis - Xarelto     Discharge Instructions    Call MD / Call 911   Complete by:  As directed    If you experience chest pain or shortness of breath, CALL 911 and be transported to the hospital emergency room.  If you develope a fever above 101 F, pus (white drainage) or increased drainage or redness at the wound, or calf pain, call your surgeon's office.   Change dressing   Complete by:  As directed    You may change your dressing dressing daily with sterile 4 x 4 inch gauze dressing and paper tape.  Do not submerge the incision under water.   Constipation Prevention   Complete by:  As directed    Drink plenty of fluids.  Prune juice may be helpful.  You may use a stool softener, such as Colace (over the counter) 100 mg twice a day.  Use MiraLax (over the counter) for constipation as needed.   Diet general   Complete by:  As directed    Discharge instructions   Complete by:  As directed    Take Xarelto for two and a half more weeks, then discontinue Xarelto. Once the patient has completed the blood thinner regimen, then take a Baby 81 mg Aspirin daily for three more weeks.   Pick up stool softner and laxative for home use following surgery while on pain medications. Do not submerge incision  under water. Please use good hand washing techniques while changing dressing each day. May shower starting three days after surgery. Please use a clean towel to pat the incision dry following showers. Continue to use ice for pain and swelling after surgery. Do not use any lotions or creams on the incision until instructed by  your surgeon.  Wear both TED hose on both legs during the day every day for three weeks, but may remove the TED hose at night at home.  Postoperative Constipation Protocol  Constipation - defined medically as fewer than three stools per week and severe constipation as less than one stool per week.  One of the most common issues patients have following surgery is constipation.  Even if you have a regular bowel pattern at home, your normal regimen is likely to be disrupted due to multiple reasons following surgery.  Combination of anesthesia, postoperative narcotics, change in appetite and fluid intake all can affect your bowels.  In order to avoid complications following surgery, here are some recommendations in order to help you during your recovery period.  Colace (docusate) - Pick up an over-the-counter form of Colace or another stool softener and take twice a day as long as you are requiring postoperative pain medications.  Take with a full glass of water daily.  If you experience loose stools or diarrhea, hold the colace until you stool forms back up.  If your symptoms do not get better within 1 week or if they get worse, check with your doctor.  Dulcolax (bisacodyl) - Pick up over-the-counter and take as directed by the product packaging as needed to assist with the movement of your bowels.  Take with a full glass of water.  Use this product as needed if not relieved by Colace only.   MiraLax (polyethylene glycol) - Pick up over-the-counter to have on hand.  MiraLax is a solution that will increase the amount of water in your bowels to assist with bowel movements.  Take as directed and can mix with a glass of water, juice, soda, coffee, or tea.  Take if you go more than two days without a movement. Do not use MiraLax more than once per day. Call your doctor if you are still constipated or irregular after using this medication for 7 days in a row.  If you continue to have problems with  postoperative constipation, please contact the office for further assistance and recommendations.  If you experience "the worst abdominal pain ever" or develop nausea or vomiting, please contact the office immediatly for further recommendations for treatment.   Do not sit on low chairs, stoools or toilet seats, as it may be difficult to get up from low surfaces   Complete by:  As directed    Driving restrictions   Complete by:  As directed    No driving until released by the physician.   Increase activity slowly as tolerated   Complete by:  As directed    Lifting restrictions   Complete by:  As directed    No lifting until released by the physician.   Patient may shower   Complete by:  As directed    You may shower without a dressing once there is no drainage.  Do not wash over the wound.  If drainage remains, do not shower until drainage stops.   TED hose   Complete by:  As directed    Use stockings (TED hose) for 3 weeks on both leg(s).  You may remove  them at night for sleeping.   Weight bearing as tolerated   Complete by:  As directed    Laterality:  right   Extremity:  Lower     Allergies as of 01/19/2018      Reactions   Sulfa Antibiotics Other (See Comments)   Swelling in ankles   Betadine [povidone Iodine] Rash   Chloroxylenol (antiseptic) Itching, Rash   PCMX surgical sterilizing scrub   Poison Ivy Extract Rash   Povidone-iodine Itching, Rash   BETADINE      Medication List    STOP taking these medications   cephALEXin 500 MG capsule Commonly known as:  KEFLEX   HYDROcodone-acetaminophen 5-325 MG tablet Commonly known as:  NORCO/VICODIN   Krill Oil 350 MG Caps   multivitamin with minerals Tabs tablet   sildenafil 100 MG tablet Commonly known as:  VIAGRA     TAKE these medications   acetaminophen 500 MG tablet Commonly known as:  TYLENOL Take 1,000 mg by mouth every 4 (four) hours as needed (for pain.).   lisinopril-hydrochlorothiazide 20-25 MG  tablet Commonly known as:  PRINZIDE,ZESTORETIC TAKE 1 TABLET BY MOUTH DAILY   methocarbamol 500 MG tablet Commonly known as:  ROBAXIN Take 1 tablet (500 mg total) by mouth every 6 (six) hours as needed for muscle spasms.   oxyCODONE 5 MG immediate release tablet Commonly known as:  Oxy IR/ROXICODONE Take 1-2 tablets (5-10 mg total) by mouth every 4 (four) hours as needed for moderate pain or severe pain.   Potassium 99 MG Tabs Take 99 mg by mouth daily.   rivaroxaban 10 MG Tabs tablet Commonly known as:  XARELTO Take 1 tablet (10 mg total) by mouth daily with breakfast. Take Xarelto for two and a half more weeks following discharge from the hospital, then discontinue Xarelto. Once the patient has completed the blood thinner regimen, then take a Baby 81 mg Aspirin daily for three more weeks. Start taking on:  01/20/2018   tamsulosin 0.4 MG Caps capsule Commonly known as:  FLOMAX Take 0.4 mg by mouth every other day. In the evening.   traMADol 50 MG tablet Commonly known as:  ULTRAM Take 1-2 tablets (50-100 mg total) by mouth every 6 (six) hours as needed (mild pain).            Discharge Care Instructions  (From admission, onward)        Start     Ordered   01/19/18 0000  Weight bearing as tolerated    Question Answer Comment  Laterality right   Extremity Lower      01/19/18 0825   01/19/18 0000  Change dressing    Comments:  You may change your dressing dressing daily with sterile 4 x 4 inch gauze dressing and paper tape.  Do not submerge the incision under water.   01/19/18 0825     Follow-up Information    [name], MD. Schedule an appointment as soon as possible for a visit on 01/31/2018.   Specialty:  Orthopedic Surgery Contact information: [address] [place] [address] [phone]           Signed: Arlee Muslim, PA-C Orthopaedic Surgery 01/19/2018, 8:26 AM

## 2018-01-19 NOTE — Evaluation (Signed)
Physical Therapy Evaluation Patient Details Name: Ruben Chavez MRN: [id number] DOB: 07-Mar-1951 Today's Date: 01/19/2018   History of Present Illness  Pt s/p R THR and with hx of L THR in 2001  Clinical Impression  Pt s/p R THR and presents with decreased R LE strength/ROM and post op pain limiting functional mobility.  Pt should progress to dc home with family assist.    Follow Up Recommendations Follow surgeon's recommendation for DC plan and follow-up therapies    Equipment Recommendations  None recommended by PT    Recommendations for Other Services       Precautions / Restrictions Precautions Precautions: Fall Restrictions Weight Bearing Restrictions: No Other Position/Activity Restrictions: WBAT      Mobility  Bed Mobility Overal bed mobility: Needs Assistance Bed Mobility: Supine to Sit     Supine to sit: Min guard        Transfers Overall transfer level: Needs assistance   Transfers: Sit to/from Stand Sit to Stand: Min guard         General transfer comment: cues for LE management and use of UEs to self assist  Ambulation/Gait Ambulation/Gait assistance: Min guard Ambulation Distance (Feet): 300 Feet Assistive device: Rolling walker (2 wheeled) Gait Pattern/deviations: Step-to pattern;Step-through pattern;Decreased step length - right;Decreased step length - left;Shuffle Gait velocity: decr Gait velocity interpretation: Below normal speed for age/gender General Gait Details: min cues for posture and position from [employer]            Wheelchair Mobility    Modified Rankin (Stroke Patients Only)       Balance Overall balance assessment: No apparent balance deficits (not formally assessed)                                           Pertinent Vitals/Pain Pain Assessment: Faces Faces Pain Scale: Hurts little more Pain Location: R hip Pain Descriptors / Indicators: Sore Pain Intervention(s): Limited activity  within patient's tolerance;Monitored during session;Premedicated before session;Ice applied    Home Living Family/patient expects to be discharged to:: Private residence Living Arrangements: Spouse/significant other Available Help at Discharge: Family Type of Home: House Home Access: Stairs to enter   Technical brewer of Steps: 1 Home Layout: Two level Home Equipment: Crutches      Prior Function Level of Independence: Independent               Hand Dominance        Extremity/Trunk Assessment   Upper Extremity Assessment Upper Extremity Assessment: Overall WFL for tasks assessed    Lower Extremity Assessment Lower Extremity Assessment: RLE deficits/detail RLE Deficits / Details: 2+/5 strength at hip with AAROM at hip to 95 flex and 25 abd    Cervical / Trunk Assessment Cervical / Trunk Assessment: Normal  Communication   Communication: No difficulties  Cognition Arousal/Alertness: Awake/alert Behavior During Therapy: WFL for tasks assessed/performed Overall Cognitive Status: Within Functional Limits for tasks assessed                                        General Comments      Exercises Total Joint Exercises Ankle Circles/Pumps: AROM;Both;20 reps;Supine Quad Sets: AROM;Both;10 reps;Supine Heel Slides: AAROM;Right;20 reps;Supine Hip ABduction/ADduction: AAROM;Right;20 reps;Supine   Assessment/Plan    PT Assessment Patient needs continued  PT services  PT Problem List Decreased strength;Decreased range of motion;Decreased activity tolerance;Decreased mobility;Decreased knowledge of use of DME;Pain       PT Treatment Interventions DME instruction;Gait training;Stair training;Functional mobility training;Therapeutic activities;Therapeutic exercise;Patient/family education    PT Goals (Current goals can be found in the Care Plan section)  Acute Rehab PT Goals Patient Stated Goal: Regain IND ASAP PT Goal Formulation: With  patient Time For Goal Achievement: 01/21/18 Potential to Achieve Goals: Good    Frequency 7X/week   Barriers to discharge        Co-evaluation               AM-PAC PT "6 Clicks" Daily Activity  Outcome Measure Difficulty turning over in bed (including adjusting bedclothes, sheets and blankets)?: A Little Difficulty moving from lying on back to sitting on the side of the bed? : A Lot Difficulty sitting down on and standing up from a chair with arms (e.g., wheelchair, bedside commode, etc,.)?: A Little Help needed moving to and from a bed to chair (including a wheelchair)?: A Little Help needed walking in hospital room?: A Little Help needed climbing 3-5 steps with a railing? : A Little 6 Click Score: 17    End of Session   Activity Tolerance: Patient tolerated treatment well Patient left: in chair;with call bell/phone within reach Nurse Communication: Mobility status PT Visit Diagnosis: Difficulty in walking, not elsewhere classified (R26.2)    Time: 1694-5038 PT Time Calculation (min) (ACUTE ONLY): 30 min   Charges:   PT Evaluation $PT Eval Low Complexity: 1 Low PT Treatments $Therapeutic Exercise: 8-22 mins   PT G Codes:        Pg [phone]   Lynetta Tomczak 01/19/2018, 9:12 AM

## 2018-01-19 NOTE — Progress Notes (Signed)
Physical Therapy Treatment Patient Details Name: Ruben Chavez MRN: [id number] DOB: 10/12/1951 Today's Date: 01/19/2018    History of Present Illness Pt s/p R THR and with hx of L THR in 2001    PT Comments    Reviewed home therex program with written instruction and progression provided.     Follow Up Recommendations  Follow surgeon's recommendation for DC plan and follow-up therapies     Equipment Recommendations  None recommended by PT    Recommendations for Other Services       Precautions / Restrictions Precautions Precautions: Fall Restrictions Weight Bearing Restrictions: No Other Position/Activity Restrictions: WBAT    Mobility  Bed Mobility Overal bed mobility: Needs Assistance Bed Mobility: Supine to Sit;Sit to Supine     Supine to sit: Supervision Sit to supine: Min guard   General bed mobility comments: cues for sequence and ue of L LE to self assist  Transfers Overall transfer level: Needs assistance Equipment used: Rolling walker (2 wheeled) Transfers: Sit to/from Stand Sit to Stand: Supervision         General transfer comment: cues for LE management and use of UEs to self assist  Ambulation/Gait Ambulation/Gait assistance: Min guard;Supervision Ambulation Distance (Feet): 300 Feet Assistive device: Crutches Gait Pattern/deviations: Step-to pattern;Step-through pattern;Decreased step length - right;Decreased step length - left;Shuffle Gait velocity: decr Gait velocity interpretation: Below normal speed for age/gender General Gait Details: cues for sequence   Stairs Stairs: Yes   Stair Management: No rails;Step to pattern;One rail Left;Forwards;With crutches Number of Stairs: 8 General stair comments: 4 stairs with bil crutches and 4 stairs with crutch and rail  Wheelchair Mobility    Modified Rankin (Stroke Patients Only)       Balance Overall balance assessment: No apparent balance deficits (not formally assessed)                                           Cognition Arousal/Alertness: Awake/alert Behavior During Therapy: WFL for tasks assessed/performed Overall Cognitive Status: Within Functional Limits for tasks assessed                                        Exercises Total Joint Exercises Ankle Circles/Pumps: AROM;Both;20 reps;Supine Quad Sets: AROM;Both;10 reps;Supine Heel Slides: AAROM;Right;20 reps;Supine Hip ABduction/ADduction: AAROM;Right;20 reps;Supine;AROM;10 reps;Standing Long Arc Quad: AROM;Right;10 reps;Seated Marching in Standing: AROM;Right;10 reps;Standing Standing Hip Extension: AROM;Right;10 reps;Standing    General Comments        Pertinent Vitals/Pain Pain Assessment: 0-10 Pain Score: 3  Pain Location: R hip Pain Descriptors / Indicators: Sore Pain Intervention(s): Limited activity within patient's tolerance;Monitored during session;Premedicated before session;Ice applied    Home Living                      Prior Function            PT Goals (current goals can now be found in the care plan section) Acute Rehab PT Goals Patient Stated Goal: Regain IND ASAP PT Goal Formulation: With patient Time For Goal Achievement: 01/21/18 Potential to Achieve Goals: Good Progress towards PT goals: Progressing toward goals    Frequency    7X/week      PT Plan Current plan remains appropriate    Co-evaluation  AM-PAC PT "6 Clicks" Daily Activity  Outcome Measure  Difficulty turning over in bed (including adjusting bedclothes, sheets and blankets)?: A Little Difficulty moving from lying on back to sitting on the side of the bed? : A Little Difficulty sitting down on and standing up from a chair with arms (e.g., wheelchair, bedside commode, etc,.)?: A Little Help needed moving to and from a bed to chair (including a wheelchair)?: A Little Help needed walking in hospital room?: A Little Help needed climbing 3-5  steps with a railing? : A Little 6 Click Score: 18    End of Session Equipment Utilized During Treatment: Gait belt Activity Tolerance: Patient tolerated treatment well Patient left: with call bell/phone within reach;in chair Nurse Communication: Mobility status PT Visit Diagnosis: Difficulty in walking, not elsewhere classified (R26.2)     Time: 0383-3383 PT Time Calculation (min) (ACUTE ONLY): 28 min  Charges:  $Gait Training: 23-37 mins $Therapeutic Exercise: 23-37 mins                    G Codes:       Pg [phone]    Corleone Biegler 01/19/2018, 2:09 PM

## 2018-01-19 NOTE — Progress Notes (Signed)
   Subjective: 1 Day Post-Op Procedure(s) (LRB): RIGHT TOTAL HIP ARTHROPLASTY ANTERIOR APPROACH (Right) Patient reports pain as mild.   Patient seen in rounds by Dr. Wynelle Link. Patient is well, but has had some minor complaints of pain in the hip, requiring pain medications We will start therapy today.  If they do well with therapy and meets all goals, then will allow home later this afternoon following therapy. Plan is to go Home after hospital stay.  Objective: Vital signs in last 24 hours: Temp:  [97.8 F (36.6 C)-98.9 F (37.2 C)] 98.7 F (37.1 C) (02/14 0504) Pulse Rate:  [62-85] 62 (02/14 0504) Resp:  [12-18] 13 (02/14 0504) BP: (94-158)/(46-78) 103/56 (02/14 0504) SpO2:  [100 %] 100 % (02/14 0504) Weight:  [81.6 kg (180 lb)] 81.6 kg (180 lb) (02/13 1509)  Intake/Output from previous day:  Intake/Output Summary (Last 24 hours) at 01/19/2018 0819 Last data filed at 01/19/2018 0600 Gross per 24 hour  Intake 3703.33 ml  Output 1705 ml  Net 1998.33 ml    Intake/Output this shift: No intake/output data recorded.  Labs: Recent Labs    01/19/18 0605  HGB 10.1*   Recent Labs    01/19/18 0605  WBC 10.2  RBC 3.02*  HCT 28.4*  PLT 216   Recent Labs    01/19/18 0605  NA 136  K 4.0  CL 103  CO2 24  BUN 21*  CREATININE 0.83  GLUCOSE 168*  CALCIUM 8.3*   No results for input(s): LABPT, INR in the last 72 hours.  EXAM General - Patient is Alert, Appropriate and Oriented Extremity - Neurovascular intact Sensation intact distally Intact pulses distally Dressing - dressing C/D/I Motor Function - intact, moving foot and toes well on exam.  Hemovac pulled without difficulty.  Past Medical History:  Diagnosis Date  . Elevated PSA   . Hyperlipidemia    pt says not anymore  . Hypertension   . Prostate cancer ([place]) 2013    Assessment/Plan: 1 Day Post-Op Procedure(s) (LRB): RIGHT TOTAL HIP ARTHROPLASTY ANTERIOR APPROACH (Right) Principal Problem:   OA  (osteoarthritis) of hip  Estimated body mass index is 25.1 kg/m as calculated from the following:   Height as of this encounter: 5\' 11"  (1.803 m).   Weight as of this encounter: 81.6 kg (180 lb). Up with therapy Discharge home if meets goals  DVT Prophylaxis - Xarelto Weight Bearing As Tolerated right Leg Hemovac Pulled Begin Therapy  If meets goals and able to go home: Diet - Cardiac diet Follow up - in 2 weeks Activity - WBAT Disposition - Home Condition Upon Discharge - pending therapy D/C Meds - See DC Summary DVT Prophylaxis - Xarelto  Arlee Muslim, PA-C Orthopaedic Surgery 01/19/2018, 8:19 AM

## 2018-02-09 DIAGNOSIS — M7541 Impingement syndrome of right shoulder: Secondary | ICD-10-CM | POA: Diagnosis not present

## 2018-02-09 DIAGNOSIS — M25511 Pain in right shoulder: Secondary | ICD-10-CM | POA: Diagnosis not present

## 2018-02-21 DIAGNOSIS — M7541 Impingement syndrome of right shoulder: Secondary | ICD-10-CM | POA: Insufficient documentation

## 2018-02-23 DIAGNOSIS — M7541 Impingement syndrome of right shoulder: Secondary | ICD-10-CM | POA: Diagnosis not present

## 2018-02-23 DIAGNOSIS — M1611 Unilateral primary osteoarthritis, right hip: Secondary | ICD-10-CM | POA: Diagnosis not present

## 2018-03-01 DIAGNOSIS — M7541 Impingement syndrome of right shoulder: Secondary | ICD-10-CM | POA: Diagnosis not present

## 2018-03-18 ENCOUNTER — Other Ambulatory Visit: Payer: Self-pay | Admitting: Internal Medicine

## 2018-04-18 DIAGNOSIS — M7541 Impingement syndrome of right shoulder: Secondary | ICD-10-CM | POA: Diagnosis not present

## 2018-05-02 DIAGNOSIS — M7551 Bursitis of right shoulder: Secondary | ICD-10-CM | POA: Diagnosis not present

## 2018-05-02 DIAGNOSIS — M75111 Incomplete rotator cuff tear or rupture of right shoulder, not specified as traumatic: Secondary | ICD-10-CM | POA: Diagnosis not present

## 2018-05-02 DIAGNOSIS — G8918 Other acute postprocedural pain: Secondary | ICD-10-CM | POA: Diagnosis not present

## 2018-05-02 DIAGNOSIS — M25511 Pain in right shoulder: Secondary | ICD-10-CM | POA: Diagnosis not present

## 2018-05-02 DIAGNOSIS — M7521 Bicipital tendinitis, right shoulder: Secondary | ICD-10-CM | POA: Diagnosis not present

## 2018-05-02 DIAGNOSIS — M7541 Impingement syndrome of right shoulder: Secondary | ICD-10-CM | POA: Diagnosis not present

## 2018-05-02 DIAGNOSIS — M24111 Other articular cartilage disorders, right shoulder: Secondary | ICD-10-CM | POA: Diagnosis not present

## 2018-05-02 DIAGNOSIS — M19011 Primary osteoarthritis, right shoulder: Secondary | ICD-10-CM | POA: Diagnosis not present

## 2018-05-03 DIAGNOSIS — Z4889 Encounter for other specified surgical aftercare: Secondary | ICD-10-CM | POA: Diagnosis not present

## 2018-05-03 DIAGNOSIS — Z8546 Personal history of malignant neoplasm of prostate: Secondary | ICD-10-CM | POA: Diagnosis not present

## 2018-05-03 DIAGNOSIS — R600 Localized edema: Secondary | ICD-10-CM | POA: Diagnosis not present

## 2018-05-03 DIAGNOSIS — M25511 Pain in right shoulder: Secondary | ICD-10-CM | POA: Diagnosis not present

## 2018-05-06 DIAGNOSIS — M25511 Pain in right shoulder: Secondary | ICD-10-CM | POA: Diagnosis not present

## 2018-05-06 DIAGNOSIS — Z8546 Personal history of malignant neoplasm of prostate: Secondary | ICD-10-CM | POA: Diagnosis not present

## 2018-05-06 DIAGNOSIS — Z4889 Encounter for other specified surgical aftercare: Secondary | ICD-10-CM | POA: Diagnosis not present

## 2018-05-06 DIAGNOSIS — R600 Localized edema: Secondary | ICD-10-CM | POA: Diagnosis not present

## 2018-06-15 ENCOUNTER — Other Ambulatory Visit: Payer: Self-pay | Admitting: Internal Medicine

## 2018-08-21 ENCOUNTER — Ambulatory Visit (INDEPENDENT_AMBULATORY_CARE_PROVIDER_SITE_OTHER): Payer: BLUE CROSS/BLUE SHIELD | Admitting: Internal Medicine

## 2018-08-21 ENCOUNTER — Encounter: Payer: Self-pay | Admitting: Internal Medicine

## 2018-08-21 VITALS — BP 120/68 | HR 57 | Temp 98.6°F | Wt 184.2 lb

## 2018-08-21 DIAGNOSIS — E785 Hyperlipidemia, unspecified: Secondary | ICD-10-CM

## 2018-08-21 DIAGNOSIS — Z860100 Personal history of colon polyps, unspecified: Secondary | ICD-10-CM

## 2018-08-21 DIAGNOSIS — I10 Essential (primary) hypertension: Secondary | ICD-10-CM | POA: Diagnosis not present

## 2018-08-21 DIAGNOSIS — Z23 Encounter for immunization: Secondary | ICD-10-CM | POA: Diagnosis not present

## 2018-08-21 DIAGNOSIS — Z0000 Encounter for general adult medical examination without abnormal findings: Secondary | ICD-10-CM

## 2018-08-21 DIAGNOSIS — C61 Malignant neoplasm of prostate: Secondary | ICD-10-CM

## 2018-08-21 LAB — CBC WITH DIFFERENTIAL/PLATELET
BASOS PCT: 0.4 % (ref 0.0–3.0)
Basophils Absolute: 0 10*3/uL (ref 0.0–0.1)
EOS PCT: 1.2 % (ref 0.0–5.0)
Eosinophils Absolute: 0.1 10*3/uL (ref 0.0–0.7)
HEMATOCRIT: 46.3 % (ref 39.0–52.0)
HEMOGLOBIN: 16 g/dL (ref 13.0–17.0)
Lymphocytes Relative: 20.5 % (ref 12.0–46.0)
Lymphs Abs: 1.2 10*3/uL (ref 0.7–4.0)
MCHC: 34.6 g/dL (ref 30.0–36.0)
MCV: 92.8 fl (ref 78.0–100.0)
Monocytes Absolute: 0.7 10*3/uL (ref 0.1–1.0)
Monocytes Relative: 11.1 % (ref 3.0–12.0)
Neutro Abs: 4 10*3/uL (ref 1.4–7.7)
Neutrophils Relative %: 66.8 % (ref 43.0–77.0)
Platelets: 282 10*3/uL (ref 150.0–400.0)
RBC: 4.99 Mil/uL (ref 4.22–5.81)
RDW: 13.9 % (ref 11.5–15.5)
WBC: 6 10*3/uL (ref 4.0–10.5)

## 2018-08-21 LAB — COMPREHENSIVE METABOLIC PANEL
ALK PHOS: 58 U/L (ref 39–117)
ALT: 32 U/L (ref 0–53)
AST: 26 U/L (ref 0–37)
Albumin: 4.4 g/dL (ref 3.5–5.2)
BUN: 22 mg/dL (ref 6–23)
CO2: 33 mEq/L — ABNORMAL HIGH (ref 19–32)
Calcium: 10.2 mg/dL (ref 8.4–10.5)
Chloride: 98 mEq/L (ref 96–112)
Creatinine, Ser: 0.92 mg/dL (ref 0.40–1.50)
GFR: 86.8 mL/min (ref >60.00)
GLUCOSE: 88 mg/dL (ref 70–99)
POTASSIUM: 4.7 meq/L (ref 3.5–5.1)
Sodium: 137 mEq/L (ref 135–145)
Total Bilirubin: 0.8 mg/dL (ref 0.2–1.2)
Total Protein: 7.4 g/dL (ref 6.0–8.3)

## 2018-08-21 LAB — LIPID PANEL
Cholesterol: 140 mg/dL (ref 0–200)
HDL: 38.7 mg/dL — AB (ref >39.00)
LDL Cholesterol: 80 mg/dL (ref 0–99)
NONHDL: 101.49
Total CHOL/HDL Ratio: 4
Triglycerides: 105 mg/dL (ref 0.0–149.0)
VLDL: 21 mg/dL (ref 0.0–40.0)

## 2018-08-21 LAB — PSA: PSA: 0.53 ng/mL (ref 0.10–4.00)

## 2018-08-21 LAB — TSH: TSH: 1.42 u[IU]/mL (ref 0.35–4.50)

## 2018-08-21 MED ORDER — TAMSULOSIN HCL 0.4 MG PO CAPS: 0.4000 mg | ORAL_CAPSULE | ORAL | 3 refills | Status: DC

## 2018-08-21 MED ORDER — LISINOPRIL-HYDROCHLOROTHIAZIDE 20-25 MG PO TABS: 1.0000 | ORAL_TABLET | Freq: Every day | ORAL | 4 refills | Status: DC

## 2018-08-21 MED ORDER — HYDROCODONE-ACETAMINOPHEN 5-325 MG PO TABS: 1.0000 | ORAL_TABLET | Freq: Four times a day (QID) | ORAL | 0 refills | Status: AC | PRN

## 2018-08-21 NOTE — Patient Instructions (Addendum)
Limit your sodium (Salt) intake    It is important that you exercise regularly, at least 20 minutes 3 to 4 times per week.  If you develop chest pain or shortness of breath seek  medical attention.  Urology  follow-up as scheduled

## 2018-08-21 NOTE — Progress Notes (Signed)
Subjective:    Patient ID: Ruben Chavez, male    DOB: 1951/06/19, 67 y.o.   MRN: [id number]  HPI  67 year old patient who is seen today for annual preventive health examination. Earlier this year he has had right shoulder surgery as well as right hip surgery.  He did well with orthopedic surgery. He has a history of prostate cancer and continues semiannual follow-up with urology.  He is status post seed implantation.  He has a history of colonic polyps and did have follow-up colonoscopy in January 2017  Social history still works 3 days of the week  Past Medical History:  Diagnosis Date  . Elevated PSA   . Hyperlipidemia    pt says not anymore  . Hypertension   . Prostate cancer [facility]) 2013     Social History   Socioeconomic History  . Marital status: Married    Spouse name: Not on file  . Number of children: Not on file  . Years of education: Not on file  . Highest education level: Not on file  Occupational History  . Not on file  Social Needs  . Financial resource strain: Not on file  . Food insecurity:    Worry: Not on file    Inability: Not on file  . Transportation needs:    Medical: Not on file    Non-medical: Not on file  Tobacco Use  . Smoking status: Former Smoker    Packs/day: 1.00    Years: 32.00    Pack years: 32.00    Types: Cigarettes    Last attempt to quit: 06/06/1999    Years since quitting: 19.2  . Smokeless tobacco: Never Used  Substance and Sexual Activity  . Alcohol use: Yes    Alcohol/week: 14.0 standard drinks    Types: 14 Shots of liquor per week  . Drug use: No  . Sexual activity: Yes  Lifestyle  . Physical activity:    Days per week: Not on file    Minutes per session: Not on file  . Stress: Not on file  Relationships  . Social connections:    Talks on phone: Not on file    Gets together: Not on file    Attends religious service: Not on file    Active member of club or organization: Not on file    Attends meetings of  clubs or organizations: Not on file    Relationship status: Not on file  . Intimate partner violence:    Fear of current or ex partner: Not on file    Emotionally abused: Not on file    Physically abused: Not on file    Forced sexual activity: Not on file  Other Topics Concern  . Not on file  Social History Narrative  . Not on file    Past Surgical History:  Procedure Laterality Date  . bilateral inguinal hernia  2008  . CYST REMOVAL TRUNK  2016   sebaceous cyst  . CYSTOSCOPY  04/22/2016   Procedure: CYSTOSCOPY;  Surgeon: [name], MD;  Location[facility];  Service: Urology;;  . KNEE ARTHROSCOPY  2007   right  . PROSTATE BIOPSY  2008  . PROSTATE BIOPSY  2013  . PROSTATE BIOPSY  01/28/2016  . RADIOACTIVE SEED IMPLANT N/A 04/22/2016   Procedure: RADIOACTIVE SEED IMPLANT/BRACHYTHERAPY IMPLANT;  Surgeon: [name], MD;  Location: [facility];  Service: Urology;  Laterality: N/A;  . TONSILLECTOMY    . TOTAL HIP ARTHROPLASTY  2001   left  . TOTAL HIP ARTHROPLASTY Right 01/18/2018   Procedure: RIGHT TOTAL HIP ARTHROPLASTY ANTERIOR APPROACH;  Surgeon: [name], MD;  Location: [facility];  Service: Orthopedics;  Laterality: Right;    Family History  Problem Relation Age of Onset  . Pancreatic cancer Mother   . Heart disease Father   . Colon cancer Neg Hx     Allergies  Allergen Reactions  . Sulfa Antibiotics Other (See Comments)    Swelling in ankles   . Betadine [Povidone Iodine] Rash  . Chloroxylenol (Antiseptic) Itching and Rash    PCMX surgical sterilizing scrub  . Poison Ivy Extract Rash  . Povidone-Iodine Itching and Rash    BETADINE    No current outpatient medications on file prior to visit.   No current facility-administered medications on file prior to visit.     BP 120/68 (BP Location: Right Arm, Patient Position: Sitting, Cuff Size: Normal)   Pulse (!) 57   Temp 98.6 F (37 C) (Oral)   Wt 184 lb 3.2 oz  (83.6 kg)   SpO2 100%   BMI 25.69 kg/m     Review of Systems  Constitutional: Negative for appetite change, chills, fatigue and fever.  HENT: Negative for congestion, dental problem, ear pain, hearing loss, sore throat, tinnitus, trouble swallowing and voice change.   Eyes: Negative for pain, discharge and visual disturbance.  Respiratory: Negative for cough, chest tightness, wheezing and stridor.   Cardiovascular: Negative for chest pain, palpitations and leg swelling.  Gastrointestinal: Negative for abdominal distention, abdominal pain, blood in stool, constipation, diarrhea, nausea and vomiting.  Genitourinary: Negative for difficulty urinating, discharge, flank pain, genital sores, hematuria and urgency.  Musculoskeletal: Negative for arthralgias, back pain, gait problem, joint swelling, myalgias and neck stiffness.  Skin: Negative for rash.  Neurological: Negative for dizziness, syncope, speech difficulty, weakness, numbness and headaches.  Hematological: Negative for adenopathy. Does not bruise/bleed easily.  Psychiatric/Behavioral: Negative for behavioral problems and dysphoric mood. The patient is not nervous/anxious.        Objective:   Physical Exam  Constitutional: He is oriented to person, place, and time. He appears well-developed.  HENT:  Head: Normocephalic.  Right Ear: External ear normal.  Left Ear: External ear normal.  Eyes: Conjunctivae and EOM are normal.  Neck: Normal range of motion.  Cardiovascular: Normal rate and normal heart sounds.  Pulmonary/Chest: Breath sounds normal.  Abdominal: Bowel sounds are normal.  Musculoskeletal: Normal range of motion. He exhibits no edema or tenderness.  Neurological: He is alert and oriented to person, place, and time.  Psychiatric: He has a normal mood and affect. His behavior is normal.          Assessment & Plan:   Preventive health examination Essential hypertension well-controlled Osteoarthritis.  Status  post multiple orthopedic procedures.  Status post right shoulder surgery and right total hip replacement surgery 2019 History of colonic polyps History of prostate cancer.  Will check a PSA.  Follow-up urology  Patient return in 1 year for follow-up with a new primary care provider  Marletta Lor

## 2018-11-16 DIAGNOSIS — Z23 Encounter for immunization: Secondary | ICD-10-CM | POA: Diagnosis not present

## 2018-12-14 ENCOUNTER — Other Ambulatory Visit: Payer: Self-pay | Admitting: Internal Medicine

## 2018-12-27 ENCOUNTER — Ambulatory Visit: Payer: BLUE CROSS/BLUE SHIELD | Admitting: Physician Assistant

## 2019-01-01 IMAGING — DX DG PORTABLE PELVIS
1 series · 1 of 1 positions shown · non-contrast
Comparison: Intraoperative fluoroscopic image at 9644 hr today.
Prostate MRI 01/14/2016

CLINICAL DATA: 67 y/o male status post right hip arthroplasty.

EXAM:
PORTABLE PELVIS 1-2 VIEWS

[pelvis ap]
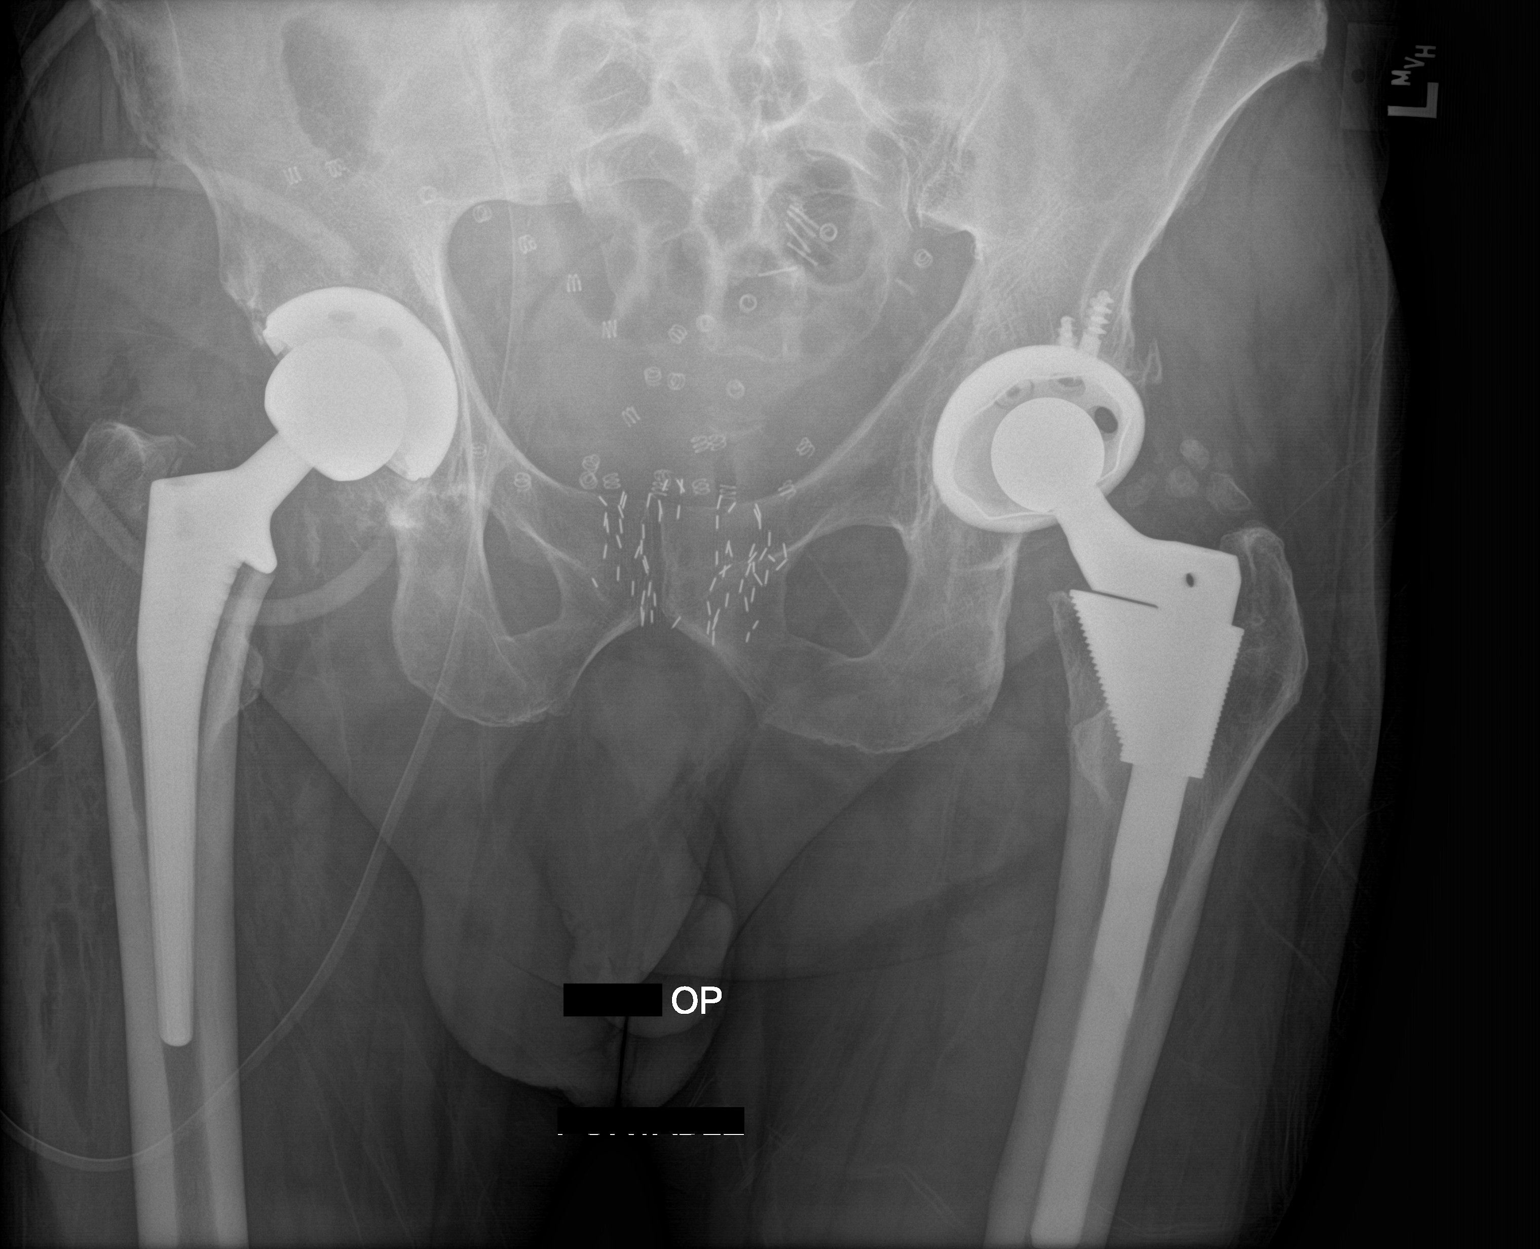

[1 of 1 positions shown; findings below may reference images not displayed]

FINDINGS: Postoperative AP portable view of the pelvis at 6707 hrs.

Preexisting left hip arthroplasty. Sequelae of prostate
brachytherapy. Sequelae of pelvic hernia repair with mesh.

Bipolar type right hip arthroplasty in place. Postoperative drain in
place. Hardware appears intact and normally aligned in the AP
projection. No unexpected osseous changes identified.
IMPRESSION: Right hip arthroplasty with no adverse features.

Preexisting left hip arthroplasty and other postoperative/post
treatment changes to the pelvis.

## 2019-01-09 DIAGNOSIS — K4091 Unilateral inguinal hernia, without obstruction or gangrene, recurrent: Secondary | ICD-10-CM | POA: Diagnosis not present

## 2019-02-07 DIAGNOSIS — K4091 Unilateral inguinal hernia, without obstruction or gangrene, recurrent: Secondary | ICD-10-CM | POA: Diagnosis not present

## 2019-02-07 DIAGNOSIS — D176 Benign lipomatous neoplasm of spermatic cord: Secondary | ICD-10-CM | POA: Diagnosis not present

## 2019-02-21 ENCOUNTER — Other Ambulatory Visit: Payer: Self-pay

## 2019-02-21 ENCOUNTER — Ambulatory Visit (INDEPENDENT_AMBULATORY_CARE_PROVIDER_SITE_OTHER): Payer: PPO | Admitting: Internal Medicine

## 2019-02-21 VITALS — BP 130/70 | HR 73 | Temp 98.2°F | Ht 71.0 in | Wt 183.3 lb

## 2019-02-21 DIAGNOSIS — M169 Osteoarthritis of hip, unspecified: Secondary | ICD-10-CM | POA: Diagnosis not present

## 2019-02-21 DIAGNOSIS — E785 Hyperlipidemia, unspecified: Secondary | ICD-10-CM | POA: Diagnosis not present

## 2019-02-21 DIAGNOSIS — C61 Malignant neoplasm of prostate: Secondary | ICD-10-CM | POA: Diagnosis not present

## 2019-02-21 DIAGNOSIS — I10 Essential (primary) hypertension: Secondary | ICD-10-CM | POA: Diagnosis not present

## 2019-02-21 NOTE — Patient Instructions (Addendum)
Great meeting you this afternoon.  Follow up each year for your annual physical exam.

## 2019-02-21 NOTE — Progress Notes (Signed)
Established Patient Office Visit     CC/Reason for Visit: transfer of care, follow up chronic conditions  HPI: Ruben Chavez is a 68 y.o. male who is coming in today for the above mentioned reasons.  Patient just recently retired and changed his healthcare insurance to [employer]. Past Medical History is significant for hypertension, hyperlipidemia, osteoarthritis and prostate cancer.  Patient says that he had a total right hip done this time last year by Dr. Pilar Plate Aluisio.  Patient sts his last CPE was September of last year. Patient says he had a radioactive seed implant in 2017 for prostate cancer at [facility]. He sees Dr. Tresa Moore  every 6 months for follow up. Patient had repeated hernia surgery 2 weeks ago with Dr. Excell Seltzer.  He is doing well and has a follow up in 2 weeks.  Patient denies having any questions or concerns.     Past Medical/Surgical History: Past Medical History:  Diagnosis Date  . Elevated PSA   . Hyperlipidemia    pt says not anymore  . Hypertension   . Prostate cancer ([place]) 2013    Past Surgical History:  Procedure Laterality Date  . bilateral inguinal hernia  2008  . CYST REMOVAL TRUNK  2016   sebaceous cyst  . CYSTOSCOPY  04/22/2016   Procedure: CYSTOSCOPY;  Surgeon: [name], MD;  Location: [facility];  Service: Urology;;  . KNEE ARTHROSCOPY  2007   right  . PROSTATE BIOPSY  2008  . PROSTATE BIOPSY  2013  . PROSTATE BIOPSY  01/28/2016  . RADIOACTIVE SEED IMPLANT N/A 04/22/2016   Procedure: RADIOACTIVE SEED IMPLANT/BRACHYTHERAPY IMPLANT;  Surgeon: [name], MD;  Location: [facility];  Service: Urology;  Laterality: N/A;  . TONSILLECTOMY    . TOTAL HIP ARTHROPLASTY  2001   left  . TOTAL HIP ARTHROPLASTY Right 01/18/2018   Procedure: RIGHT TOTAL HIP ARTHROPLASTY ANTERIOR APPROACH;  Surgeon: [name], MD;  Location: [facility];  Service: Orthopedics;  Laterality: Right;    Social History:  reports that he quit smoking about 19 years ago. His smoking use included cigarettes. He has a 32.00 pack-year smoking history. He has never used smokeless tobacco. He reports current alcohol use of about 14.0 standard drinks of alcohol per week. He reports that he does not use drugs.  Allergies: Allergies  Allergen Reactions  . Sulfa Antibiotics Other (See Comments)    Swelling in ankles   . Betadine [Povidone Iodine] Rash  . Chloroxylenol (Antiseptic) Itching and Rash    PCMX surgical sterilizing scrub  . Poison Ivy Extract Rash  . Povidone-Iodine Itching and Rash    BETADINE    Family History:  Family History  Problem Relation Age of Onset  . Pancreatic cancer Mother   . Heart disease Father   . Colon cancer Neg Hx      Current Outpatient Medications:  .  lisinopril-hydrochlorothiazide (PRINZIDE,ZESTORETIC) 20-25 MG tablet, Take 1 tablet by mouth daily., Disp: 90 tablet, Rfl: 4 .  tamsulosin (FLOMAX) 0.4 MG CAPS capsule, Take 1 capsule (0.4 mg total) by mouth every other day. In the evening., Disp: 90 capsule, Rfl: 3  Review of Systems:  Constitutional: Denies fever, chills, diaphoresis, appetite change and fatigue.  HEENT: Denies photophobia, eye pain, redness, hearing loss, ear pain, congestion, sore throat, rhinorrhea, sneezing, mouth sores, trouble swallowing, neck pain, neck stiffness and tinnitus.   Respiratory: Denies SOB, DOE, cough, chest tightness,  and wheezing.   Cardiovascular: Denies  chest pain, palpitations and leg swelling.  Gastrointestinal: Denies nausea, vomiting, abdominal pain, diarrhea, constipation, blood in stool and abdominal distention.  Genitourinary: Denies dysuria, urgency, frequency, hematuria, flank pain and difficulty urinating.  Endocrine: Denies: hot or cold intolerance, sweats, changes in hair or nails, polyuria, polydipsia. Musculoskeletal: Denies myalgias, back pain, joint swelling, arthralgias and gait problem.  Skin: Denies pallor, rash  and wound.  Neurological: Denies dizziness, seizures, syncope, weakness, light-headedness, numbness and headaches.  Hematological: Denies adenopathy. Easy bruising, personal or family bleeding history  Psychiatric/Behavioral: Denies suicidal ideation, mood changes, confusion, nervousness, sleep disturbance and agitation   Physical Exam: Vitals:   02/21/19 1340  BP: 130/70  Pulse: 73  Temp: 98.2 F (36.8 C)  TempSrc: Oral  SpO2: 98%  Weight: 183 lb 4.8 oz (83.1 kg)  Height: 5\' 11"  (1.803 m)    Body mass index is 25.57 kg/m.   Constitutional: NAD, calm, comfortable Eyes: PERRL, lids and conjunctivae normal Respiratory: clear to auscultation bilaterally, no wheezing, no crackles. Normal respiratory effort. No accessory muscle use.  Cardiovascular: Regular rate and rhythm, no murmurs / rubs / gallops. No extremity edema. 2+ pedal pulses.   Psychiatric: Normal judgment and insight. Alert and oriented x 3. Normal mood.    Impression and Plan:  Essential hypertension -cont taking lisinopril/HCTZ , well controlled  Prostate cancer ([place]) -follow up with Dr. Tresa Moore every 6 months  Osteoarthritis of hip, unspecified laterality, unspecified osteoarthritis type -stable, total right hip last year -f/u with Dr. Wynelle Link as needed  Hyperlipidemia, unspecified hyperlipidemia type -discussed heart healthy diet -will draw labs at his PE in September of this year  Right Inguinal Hernia -S/p recent repair by Dr. Excell Seltzer. Doing well.    Patient Instructions  Great meeting you this afternoon.  Follow up each year for your annual physical exam.       Enzo Bi, RN DNP Student [facility]

## 2019-03-07 ENCOUNTER — Ambulatory Visit: Payer: Self-pay | Admitting: Internal Medicine

## 2019-03-07 ENCOUNTER — Ambulatory Visit (INDEPENDENT_AMBULATORY_CARE_PROVIDER_SITE_OTHER): Payer: PPO | Admitting: Internal Medicine

## 2019-03-07 ENCOUNTER — Other Ambulatory Visit: Payer: Self-pay

## 2019-03-07 DIAGNOSIS — L03113 Cellulitis of right upper limb: Secondary | ICD-10-CM

## 2019-03-07 DIAGNOSIS — R21 Rash and other nonspecific skin eruption: Secondary | ICD-10-CM

## 2019-03-07 DIAGNOSIS — W57XXXA Bitten or stung by nonvenomous insect and other nonvenomous arthropods, initial encounter: Secondary | ICD-10-CM

## 2019-03-07 DIAGNOSIS — S50861A Insect bite (nonvenomous) of right forearm, initial encounter: Secondary | ICD-10-CM | POA: Diagnosis not present

## 2019-03-07 MED ORDER — TRIAMCINOLONE ACETONIDE 0.1 % EX CREA: 1.0000 "application " | TOPICAL_CREAM | Freq: Two times a day (BID) | CUTANEOUS | 0 refills | Status: DC

## 2019-03-07 MED ORDER — DOXYCYCLINE HYCLATE 100 MG PO TABS: 100.0000 mg | ORAL_TABLET | Freq: Two times a day (BID) | ORAL | 0 refills | Status: DC

## 2019-03-07 NOTE — Telephone Encounter (Signed)
WebEx appointment scheduled.

## 2019-03-07 NOTE — Telephone Encounter (Signed)
Pt. Reports he noticed a possible spider bite on right forearm 2 days ago. Then developed a rash on both arms, stomach and sides, some on legs. No fever. Rash is "very itchy." Using hydrocortisone with some relief. No other symptoms.Has taken pictures of his rash. Would like "something done about this today." Contact number [phone].  Answer Assessment - Initial Assessment Questions 1. APPEARANCE of RASH: "Describe the rash." (e.g., spots, blisters, raised areas, skin peeling, scaly)     Blotchy, red and raised 2. SIZE: "How big are the spots?" (e.g., tip of pen, eraser, coin; inches, centimeters)     Blisters -  1/2 inch to being covered 3. LOCATION: "Where is the rash located?"     Right arm, stomach and side, upper legs 4. COLOR: "What color is the rash?" (Note: It is difficult to assess rash color in people with darker-colored skin. When this situation occurs, simply ask the caller to describe what they see.)     Red 5. ONSET: "When did the rash begin?"     2 days ago 6. FEVER: "Do you have a fever?" If so, ask: "What is your temperature, how was it measured, and when did it start?"     No 7. ITCHING: "Does the rash itch?" If so, ask: "How bad is the itch?" (Scale 1-10; or mild, moderate, severe)     10 8. CAUSE: "What do you think is causing the rash?"     Maybe a spider bite 9. MEDICATION FACTORS: "Have you started any new medications within the last 2 weeks?" (e.g., antibiotics)      No 10. OTHER SYMPTOMS: "Do you have any other symptoms?" (e.g., dizziness, headache, sore throat, joint pain)       No 11. PREGNANCY: "Is there any chance you are pregnant?" "When was your last menstrual period?"       n/a  Protocols used: RASH OR REDNESS - [facility]

## 2019-03-07 NOTE — Progress Notes (Signed)
Virtual Visit via Video Note  I connected with Ruben Chavez on 03/07/19 at  1:30 PM EDT by a video enabled telemedicine application and verified that I am speaking with the correct person using two identifiers.  Location patient: home Location provider: work office Persons participating in the virtual visit: patient, provider  I discussed the limitations of evaluation and management by telemedicine and the availability of in person appointments. The patient expressed understanding and agreed to proceed.   H[place]: [place] he [place] he noticed a black area on the inside of his right forearm and wonders if he may have a spider bite. Does not recall any insect bites. He has subsequently developed redness surrounding that area. He then noticed a vesicular rash over abdomen, and right thigh (he thinks it looks like prior episodes of poison ivy). See pictures below:       ROS: Constitutional: Denies fever, chills, diaphoresis, appetite change and fatigue.  HEENT: Denies photophobia, eye pain, redness, hearing loss, ear pain, congestion, sore throat, rhinorrhea, sneezing, mouth sores, trouble swallowing, neck pain, neck stiffness and tinnitus.   Respiratory: Denies SOB, DOE, cough, chest tightness,  and wheezing.   Cardiovascular: Denies chest pain, palpitations and leg swelling.  Gastrointestinal: Denies nausea, vomiting, abdominal pain, diarrhea, constipation, blood in stool and abdominal distention.  Genitourinary: Denies dysuria, urgency, frequency, hematuria, flank pain and difficulty urinating.  Endocrine: Denies: hot or cold intolerance, sweats, changes in hair or nails, polyuria, polydipsia. Musculoskeletal: Denies myalgias, back pain, joint swelling, arthralgias and gait problem.  Neurological: Denies dizziness, seizures, syncope, weakness, light-headedness, numbness and headaches.  Hematological: Denies adenopathy. Easy bruising, personal  or family bleeding history  Psychiatric/Behavioral: Denies suicidal ideation, mood changes, confusion, nervousness, sleep disturbance and agitation   Past Medical History:  Diagnosis Date   Elevated PSA    Hyperlipidemia    pt says not anymore   Hypertension    Prostate cancer ([place]) 2013    Past Surgical History:  Procedure Laterality Date   bilateral inguinal hernia  2008   CYST REMOVAL TRUNK  2016   sebaceous cyst   CYSTOSCOPY  04/22/2016   Procedure: CYSTOSCOPY;  Surgeon: [name], MD;  Location: [facility];  Service: Urology;;   KNEE ARTHROSCOPY  2007   right   PROSTATE BIOPSY  2008   PROSTATE BIOPSY  2013   PROSTATE BIOPSY  01/28/2016   RADIOACTIVE SEED IMPLANT N/A 04/22/2016   Procedure: RADIOACTIVE SEED IMPLANT/BRACHYTHERAPY IMPLANT;  Surgeon: [name], MD;  [facility];  Service: Urology;  Laterality: N/A;   TONSILLECTOMY     TOTAL HIP ARTHROPLASTY  2001   left   TOTAL HIP ARTHROPLASTY Right 01/18/2018   Procedure: RIGHT TOTAL HIP ARTHROPLASTY ANTERIOR APPROACH;  Surgeon: [name], MD;  Location: [facility];  Service: Orthopedics;  Laterality: Right;    Family History  Problem Relation Age of Onset   Pancreatic cancer Mother    Heart disease Father    Colon cancer Neg Hx     SOCIAL HX:   reports that he quit smoking about 19 years ago. His smoking use included cigarettes. He has a 32.00 pack-year smoking history. He has never used smokeless tobacco. He reports current alcohol use of about 14.0 standard drinks of alcohol per week. He reports that he does not use drugs.   Current Outpatient Medications:    doxycycline (VIBRA-TABS) 100 MG tablet, Take 1 tablet (100 mg  total) by mouth 2 (two) times daily., Disp: 20 tablet, Rfl: 0   lisinopril-hydrochlorothiazide (PRINZIDE,ZESTORETIC) 20-25 MG tablet, Take 1 tablet by mouth daily., Disp: 90 tablet, Rfl: 4   tamsulosin (FLOMAX) 0.4 MG CAPS  capsule, Take 1 capsule (0.4 mg total) by mouth every other day. In the evening., Disp: 90 capsule, Rfl: 3   triamcinolone cream (KENALOG) 0.1 %, Apply 1 application topically 2 (two) times daily., Disp: 30 g, Rfl: 0  EXAM:   VITALS per patient if applicable: none reported  GENERAL: alert, oriented, appears well and in no acute distress  HEENT: atraumatic, conjunttiva clear, no obvious abnormalities on inspection of external nose and ears  NECK: normal movements of the head and neck  LUNGS: on inspection no signs of respiratory distress, breathing rate appears normal, no obvious gross increased work of breathing, gasping or wheezing  CV: no obvious cyanosis  MS: moves all visible extremities without noticeable abnormality  Skin: rash as evidenced by pictures above  PSYCH/NEURO: pleasant and cooperative, no obvious depression or anxiety, speech and thought processing grossly intact  ASSESSMENT AND PLAN:   Insect bite of right forearm, initial encounter  Cellulitis of right upper extremity  -I suspect spider bite given necrotic appearance but certainly with surrounding cellulitis. -Doxy for 10 days. -He knows to notify us if no improvement in 10-14 days.  Rash  -Gross appearance of rash on torso, by pictures appears different that on arm, ?contact dermatitis, poison ivy given vesicular appearance and pruritus. -Triamcinolone cream prescrbed.     I discussed the assessment and treatment plan with the patient. The patient was provided an opportunity to ask questions and all were answered. The patient agreed with the plan and demonstrated an understanding of the instructions.   The patient was advised to call back or seek an in-person evaluation if the symptoms worsen or if the condition fails to improve as anticipated.    Lelon Frohlich, MD  [facility] at [facility]

## 2019-07-17 ENCOUNTER — Encounter: Payer: Self-pay | Admitting: Internal Medicine

## 2019-07-17 ENCOUNTER — Ambulatory Visit (INDEPENDENT_AMBULATORY_CARE_PROVIDER_SITE_OTHER): Payer: PPO | Admitting: Internal Medicine

## 2019-07-17 ENCOUNTER — Other Ambulatory Visit: Payer: Self-pay

## 2019-07-17 VITALS — BP 120/74 | HR 71 | Temp 97.8°F | Wt 188.5 lb

## 2019-07-17 DIAGNOSIS — H6123 Impacted cerumen, bilateral: Secondary | ICD-10-CM

## 2019-07-17 DIAGNOSIS — K219 Gastro-esophageal reflux disease without esophagitis: Secondary | ICD-10-CM | POA: Diagnosis not present

## 2019-07-17 NOTE — Progress Notes (Signed)
Established Patient Office Visit     CC/Reason for Visit: Wants ears cleaned out, discuss acid reflux  HPI: Ruben Chavez is a 68 y.o. male who is coming in today for the above mentioned reasons. Has had decreased hearing of his right ear and is requesting ears be cleaned out. He also would like to discuss acid reflux. He gets an episode q2-3 months. No associated food triggers. Had one last pm. Took 2 famotidine which resolved episode.  Past Medical/Surgical History: Past Medical History:  Diagnosis Date  . Elevated PSA   . Hyperlipidemia    pt says not anymore  . Hypertension   . Prostate cancer ([place]) 2013    Past Surgical History:  Procedure Laterality Date  . bilateral inguinal hernia  2008  . CYST REMOVAL TRUNK  2016   sebaceous cyst  . CYSTOSCOPY  04/22/2016   Procedure: CYSTOSCOPY;  Surgeon: [name], MD;  Location: [facility];  Service: Urology;;  . KNEE ARTHROSCOPY  2007   right  . PROSTATE BIOPSY  2008  . PROSTATE BIOPSY  2013  . PROSTATE BIOPSY  01/28/2016  . RADIOACTIVE SEED IMPLANT N/A 04/22/2016   Procedure: RADIOACTIVE SEED IMPLANT/BRACHYTHERAPY IMPLANT;  Surgeon: [name], MD;  Location: [facility];  Service: Urology;  Laterality: N/A;  . TONSILLECTOMY    . TOTAL HIP ARTHROPLASTY  2001   left  . TOTAL HIP ARTHROPLASTY Right 01/18/2018   Procedure: RIGHT TOTAL HIP ARTHROPLASTY ANTERIOR APPROACH;  Surgeon: [name], MD;  Location: [facility];  Service: Orthopedics;  Laterality: Right;    Social History:  reports that he quit smoking about 20 years ago. His smoking use included cigarettes. He has a 32.00 pack-year smoking history. He has never used smokeless tobacco. He reports current alcohol use of about 14.0 standard drinks of alcohol per week. He reports that he does not use drugs.  Allergies: Allergies  Allergen Reactions  . Sulfa Antibiotics Other (See Comments)    Swelling in ankles   .  Betadine [Povidone Iodine] Rash  . Chloroxylenol (Antiseptic) Itching and Rash    PCMX surgical sterilizing scrub  . Poison Ivy Extract Rash  . Povidone-Iodine Itching and Rash    BETADINE    Family History:  Family History  Problem Relation Age of Onset  . Pancreatic cancer Mother   . Heart disease Father   . Colon cancer Neg Hx      Current Outpatient Medications:  .  lisinopril-hydrochlorothiazide (PRINZIDE,ZESTORETIC) 20-25 MG tablet, Take 1 tablet by mouth daily., Disp: 90 tablet, Rfl: 4 .  tamsulosin (FLOMAX) 0.4 MG CAPS capsule, Take 1 capsule (0.4 mg total) by mouth every other day. In the evening., Disp: 90 capsule, Rfl: 3 .  triamcinolone cream (KENALOG) 0.1 %, Apply 1 application topically 2 (two) times daily., Disp: 30 g, Rfl: 0  Review of Systems:  Constitutional: Denies fever, chills, diaphoresis, appetite change and fatigue.  HEENT: Denies photophobia, eye pain, redness, hearing loss, ear pain, congestion, sore throat, rhinorrhea, sneezing, mouth sores, trouble swallowing, neck pain, neck stiffness and tinnitus.   Respiratory: Denies SOB, DOE, cough, chest tightness,  and wheezing.   Cardiovascular: Denies chest pain, palpitations and leg swelling.  Gastrointestinal: Denies nausea, vomiting, abdominal pain, diarrhea, constipation, blood in stool and abdominal distention.  Genitourinary: Denies dysuria, urgency, frequency, hematuria, flank pain and difficulty urinating.  Endocrine: Denies: hot or cold intolerance, sweats, changes in hair or nails, polyuria, polydipsia. Musculoskeletal: Denies myalgias, back  pain, joint swelling, arthralgias and gait problem.  Skin: Denies pallor, rash and wound.  Neurological: Denies dizziness, seizures, syncope, weakness, light-headedness, numbness and headaches.  Hematological: Denies adenopathy. Easy bruising, personal or family bleeding history  Psychiatric/Behavioral: Denies suicidal ideation, mood changes, confusion, nervousness,  sleep disturbance and agitation    Physical Exam: Vitals:   07/17/19 1011  BP: 120/74  Pulse: 71  Temp: 97.8 F (36.6 C)  TempSrc: Temporal  SpO2: 97%  Weight: 188 lb 8 oz (85.5 kg)    Body mass index is 26.29 kg/m.   Constitutional: NAD, calm, comfortable Eyes: PERRL, lids and conjunctivae normal ENMT: Mucous membranes are moist.Tympanic membrane is obstructed by cerumen bilaterally. Neck: normal, supple, no masses, no thyromegaly Psychiatric: Normal judgment and insight. Alert and oriented x 3. Normal mood.    Impression and Plan:  Gastroesophageal reflux disease without esophagitis -Since infrequent episodes, have advised continued use of H2 blockers and OTC antacids PRN.  Bilateral impacted cerumen -Cerumen Desimpaction  Warm water was applied and gentle ear lavage performed on bilateral ears. There were no complications and following the desimpaction the tympanic membranes were visible. Tympanic membranes are intact following the procedure. Auditory canals are normal. The patient reported relief of symptoms after removal of cerumen.      Lelon Frohlich, MD [facility] at [facility]

## 2019-09-11 ENCOUNTER — Ambulatory Visit: Payer: PPO | Admitting: Internal Medicine

## 2019-09-19 ENCOUNTER — Other Ambulatory Visit: Payer: Self-pay | Admitting: *Deleted

## 2019-09-19 MED ORDER — LISINOPRIL-HYDROCHLOROTHIAZIDE 20-25 MG PO TABS: 1.0000 | ORAL_TABLET | Freq: Every day | ORAL | 1 refills | Status: DC

## 2019-10-09 ENCOUNTER — Other Ambulatory Visit: Payer: Self-pay

## 2019-10-09 ENCOUNTER — Encounter: Payer: Self-pay | Admitting: Internal Medicine

## 2019-10-09 ENCOUNTER — Ambulatory Visit (INDEPENDENT_AMBULATORY_CARE_PROVIDER_SITE_OTHER): Payer: PPO | Admitting: Internal Medicine

## 2019-10-09 VITALS — BP 120/80 | HR 68 | Temp 97.8°F | Ht 69.0 in | Wt 188.7 lb

## 2019-10-09 DIAGNOSIS — F528 Other sexual dysfunction not due to a substance or known physiological condition: Secondary | ICD-10-CM

## 2019-10-09 DIAGNOSIS — C61 Malignant neoplasm of prostate: Secondary | ICD-10-CM

## 2019-10-09 DIAGNOSIS — I10 Essential (primary) hypertension: Secondary | ICD-10-CM

## 2019-10-09 DIAGNOSIS — Z0000 Encounter for general adult medical examination without abnormal findings: Secondary | ICD-10-CM

## 2019-10-09 DIAGNOSIS — M169 Osteoarthritis of hip, unspecified: Secondary | ICD-10-CM | POA: Diagnosis not present

## 2019-10-09 DIAGNOSIS — Z23 Encounter for immunization: Secondary | ICD-10-CM

## 2019-10-09 DIAGNOSIS — K4020 Bilateral inguinal hernia, without obstruction or gangrene, not specified as recurrent: Secondary | ICD-10-CM | POA: Diagnosis not present

## 2019-10-09 DIAGNOSIS — E785 Hyperlipidemia, unspecified: Secondary | ICD-10-CM | POA: Diagnosis not present

## 2019-10-09 LAB — CBC WITH DIFFERENTIAL/PLATELET
Basophils Absolute: 0 10*3/uL (ref 0.0–0.1)
Basophils Relative: 0.6 % (ref 0.0–3.0)
Eosinophils Absolute: 0.1 10*3/uL (ref 0.0–0.7)
Eosinophils Relative: 1.2 % (ref 0.0–5.0)
HCT: 43.5 % (ref 39.0–52.0)
Hemoglobin: 14.8 g/dL (ref 13.0–17.0)
Lymphocytes Relative: 17.6 % (ref 12.0–46.0)
Lymphs Abs: 1 10*3/uL (ref 0.7–4.0)
MCHC: 34 g/dL (ref 30.0–36.0)
MCV: 94.8 fl (ref 78.0–100.0)
Monocytes Absolute: 0.5 10*3/uL (ref 0.1–1.0)
Monocytes Relative: 9.8 % (ref 3.0–12.0)
Neutro Abs: 4 10*3/uL (ref 1.4–7.7)
Neutrophils Relative %: 70.8 % (ref 43.0–77.0)
Platelets: 239 10*3/uL (ref 150.0–400.0)
RBC: 4.59 Mil/uL (ref 4.22–5.81)
RDW: 14.5 % (ref 11.5–15.5)
WBC: 5.6 10*3/uL (ref 4.0–10.5)

## 2019-10-09 LAB — COMPREHENSIVE METABOLIC PANEL
ALT: 26 U/L (ref 0–53)
AST: 25 U/L (ref 0–37)
Albumin: 4.6 g/dL (ref 3.5–5.2)
Alkaline Phosphatase: 46 U/L (ref 39–117)
BUN: 25 mg/dL — ABNORMAL HIGH (ref 6–23)
CO2: 30 mEq/L (ref 19–32)
Calcium: 9.9 mg/dL (ref 8.4–10.5)
Chloride: 99 mEq/L (ref 96–112)
Creatinine, Ser: 0.91 mg/dL (ref 0.40–1.50)
GFR: 82.43 mL/min (ref >60.00)
Glucose, Bld: 91 mg/dL (ref 70–99)
Potassium: 4.9 mEq/L (ref 3.5–5.1)
Sodium: 137 mEq/L (ref 135–145)
Total Bilirubin: 0.7 mg/dL (ref 0.2–1.2)
Total Protein: 7.3 g/dL (ref 6.0–8.3)

## 2019-10-09 LAB — LIPID PANEL
Cholesterol: 147 mg/dL (ref 0–200)
HDL: 53 mg/dL (ref >39.00)
LDL Cholesterol: 83 mg/dL (ref 0–99)
NonHDL: 93.72
Total CHOL/HDL Ratio: 3
Triglycerides: 52 mg/dL (ref 0.0–149.0)
VLDL: 10.4 mg/dL (ref 0.0–40.0)

## 2019-10-09 LAB — VITAMIN B12: Vitamin B-12: 463 pg/mL (ref 211–911)

## 2019-10-09 LAB — TSH: TSH: 1.44 u[IU]/mL (ref 0.35–4.50)

## 2019-10-09 LAB — VITAMIN D 25 HYDROXY (VIT D DEFICIENCY, FRACTURES): VITD: 81.18 ng/mL (ref 30.00–100.00)

## 2019-10-09 LAB — PSA: PSA: 0.1 ng/mL (ref 0.10–4.00)

## 2019-10-09 MED ORDER — HYDROCODONE-ACETAMINOPHEN 5-325 MG PO TABS: 1.0000 | ORAL_TABLET | Freq: Four times a day (QID) | ORAL | 0 refills | Status: DC | PRN

## 2019-10-09 NOTE — Progress Notes (Signed)
Established Patient Office Visit     CC/Reason for Visit: Annual preventive exam and initial Medicare wellness visit  HPI: Ruben Chavez is a 68 y.o. male who is coming in today for the above mentioned reasons. Past Medical History is significant for: Well-controlled hypertension, hyperlipidemia, osteoarthritis for which he uses very infrequent hydrocodone and history of prostate cancer.  He had a total right hip replacement in 2019.  He has history of prostate cancer with radioactive seed implant in 2017 and has a urologist that he follows with chronic basis.  He had an inguinal hernia repaired earlier this year.  He feels well today and has no acute complaints.  He has routine dental care, has not seen an eye doctor in over 2 years, he exercises routinely.   Past Medical/Surgical History: Past Medical History:  Diagnosis Date  . Elevated PSA   . Hyperlipidemia    pt says not anymore  . Hypertension   . Prostate cancer ([place]) 2013    Past Surgical History:  Procedure Laterality Date  . bilateral inguinal hernia  2008  . CYST REMOVAL TRUNK  2016   sebaceous cyst  . CYSTOSCOPY  04/22/2016   Procedure: CYSTOSCOPY;  Surgeon: [name], MD;  Location: [facility];  Service: Urology;;  . KNEE ARTHROSCOPY  2007   right  . PROSTATE BIOPSY  2008  . PROSTATE BIOPSY  2013  . PROSTATE BIOPSY  01/28/2016  . RADIOACTIVE SEED IMPLANT N/A 04/22/2016   Procedure: RADIOACTIVE SEED IMPLANT/BRACHYTHERAPY IMPLANT;  Surgeon: [name], MD;  Location: [facility];  Service: Urology;  Laterality: N/A;  . TONSILLECTOMY    . TOTAL HIP ARTHROPLASTY  2001   left  . TOTAL HIP ARTHROPLASTY Right 01/18/2018   Procedure: RIGHT TOTAL HIP ARTHROPLASTY ANTERIOR APPROACH;  Surgeon: [name], MD;  Location: [facility];  Service: Orthopedics;  Laterality: Right;    Social History:  reports that he quit smoking about 20 years ago. His smoking use included  cigarettes. He has a 32.00 pack-year smoking history. He has never used smokeless tobacco. He reports current alcohol use of about 14.0 standard drinks of alcohol per week. He reports that he does not use drugs.  Allergies: Allergies  Allergen Reactions  . Sulfa Antibiotics Other (See Comments)    Swelling in ankles   . Betadine [Povidone Iodine] Rash  . Chloroxylenol (Antiseptic) Itching and Rash    PCMX surgical sterilizing scrub  . Poison Ivy Extract Rash  . Povidone-Iodine Itching and Rash    BETADINE    Family History:  Family History  Problem Relation Age of Onset  . Pancreatic cancer Mother   . Heart disease Father   . Colon cancer Neg Hx      Current Outpatient Medications:  .  HYDROcodone-acetaminophen (NORCO/VICODIN) 5-325 MG tablet, Take 1 tablet by mouth every 6 (six) hours as needed for moderate pain., Disp: 30 tablet, Rfl: 0 .  lisinopril-hydrochlorothiazide (ZESTORETIC) 20-25 MG tablet, Take 1 tablet by mouth daily., Disp: 90 tablet, Rfl: 1  Review of Systems:  Constitutional: Denies fever, chills, diaphoresis, appetite change and fatigue.  HEENT: Denies photophobia, eye pain, redness, hearing loss, ear pain, congestion, sore throat, rhinorrhea, sneezing, mouth sores, trouble swallowing, neck pain, neck stiffness and tinnitus.   Respiratory: Denies SOB, DOE, cough, chest tightness,  and wheezing.   Cardiovascular: Denies chest pain, palpitations and leg swelling.  Gastrointestinal: Denies nausea, vomiting, abdominal pain, diarrhea, constipation, blood in stool and abdominal  distention.  Genitourinary: Denies dysuria, urgency, frequency, hematuria, flank pain and difficulty urinating.  Endocrine: Denies: hot or cold intolerance, sweats, changes in hair or nails, polyuria, polydipsia. Musculoskeletal: Denies myalgias, back pain, joint swelling, arthralgias and gait problem.  Skin: Denies pallor, rash and wound.  Neurological: Denies dizziness, seizures, syncope,  weakness, light-headedness, numbness and headaches.  Hematological: Denies adenopathy. Easy bruising, personal or family bleeding history  Psychiatric/Behavioral: Denies suicidal ideation, mood changes, confusion, nervousness, sleep disturbance and agitation    Physical Exam: Vitals:   10/09/19 1145  BP: 120/80  Pulse: 68  Temp: 97.8 F (36.6 C)  TempSrc: Temporal  SpO2: 97%  Weight: 188 lb 11.2 oz (85.6 kg)  Height: 5' 9"  (1.753 m)    Body mass index is 27.87 kg/m.   Constitutional: NAD, calm, comfortable Eyes: PERRL, lids and conjunctivae normal ENMT: Mucous membranes are moist. Posterior pharynx clear of any exudate or lesions. Normal dentition. Tympanic membrane is pearly white, no erythema or bulging. Neck: normal, supple, no masses, no thyromegaly Respiratory: clear to auscultation bilaterally, no wheezing, no crackles. Normal respiratory effort. No accessory muscle use.  Cardiovascular: Regular rate and rhythm, no murmurs / rubs / gallops. No extremity edema. 2+ pedal pulses.  Abdomen: no tenderness, no masses palpated. No hepatosplenomegaly. Bowel sounds positive.  Musculoskeletal: no clubbing / cyanosis. No joint deformity upper and lower extremities. Good ROM, no contractures. Normal muscle tone.  Skin: no rashes, lesions, ulcers. No induration Neurologic: CN 2-12 grossly intact. Sensation intact, DTR normal. Strength 5/5 in all 4.  Psychiatric: Normal judgment and insight. Alert and oriented x 3. Normal mood.    Subsequent Medicare wellness visit   1. Risk factors, based on past  M,S,F -cardiovascular disease risk factors include age, gender, history of hypertension   2.  Physical activities: He exercises on a daily basis   3.  Depression/mood:  Stable, not depressed   4.  Hearing:  No issues   5.  ADL's: Independent in all ADLs   6.  Fall risk:  Low fall risk   7.  Home safety: No problems identified   8.  Height weight, and visual acuity: Height and  weight as above, visual acuity is 20/25 with the left eye, 20/32 with the right eye and 20/25 together   9.  Counseling:  Advised routine follow-up with urology as scheduled   10. Lab orders based on risk factors: Laboratory update will be reviewed   11. Referral :  None today   12. Care plan:  Follow-up with me in 6 months   13. Cognitive assessment:  No cognitive impairment   14. Screening: Patient provided with a written and personalized 5-10 year screening schedule in the AVS.   yes   15. Provider List Update:   PCP, urology (Dr. Diona Fanti)  28. Advance Directives: Full code     Fall Risk  02/21/2019 08/21/2018 10/05/2016 05/04/2016 03/18/2016  Falls in the past year? 0 No No No No  Number falls in past yr: 0 - - - -  Injury with Fall? 0 - - - -     Impression and Plan:  Encounter for preventive health examination -He has routine dental care, have advised routine eye care. -Will receive flu vaccine in office today.  Have discussed shingles vaccination series which she will obtain at his local pharmacy, otherwise immunizations are up-to-date. -Screening labs today. -Healthy lifestyle has been discussed in detail. -He had a colonoscopy in 2017 and is a 5-year recall. -He  has a history of prostate cancer, check PSA today.  Prostate cancer [facility]) -Check PSA, continue follow-up with urology as scheduled.  Osteoarthritis of hip, unspecified laterality, unspecified osteoarthritis type  -PDMP has been reviewed, no red flags.  Overdose risk score is 270.  He only uses about 30 tablets of Vicodin throughout the whole year.  Will prescribe #30 today.  Bilateral inguinal hernia without obstruction or gangrene, recurrence not specified -Status post repair earlier this year.  Hyperlipidemia, unspecified hyperlipidemia type  -Last LDL was 88 in September 2019, recheck lipids today.  Essential hypertension  -Well-controlled.    Patient Instructions  -Nice seeing you today!!   -Lab work today; will notify you once results are available.  -Flu vaccine today.  -Schedule 6 month follow up.   Preventive Care 70 Years and Older, Male Preventive care refers to lifestyle choices and visits with your health care provider that can promote health and wellness. This includes:  A yearly physical exam. This is also called an annual well check.  Regular dental and eye exams.  Immunizations.  Screening for certain conditions.  Healthy lifestyle choices, such as diet and exercise. What can I expect for my preventive care visit? Physical exam Your health care provider will check:  Height and weight. These may be used to calculate body mass index (BMI), which is a measurement that tells if you are at a healthy weight.  Heart rate and blood pressure.  Your skin for abnormal spots. Counseling Your health care provider may ask you questions about:  Alcohol, tobacco, and drug use.  Emotional well-being.  Home and relationship well-being.  Sexual activity.  Eating habits.  History of falls.  Memory and ability to understand (cognition).  Work and work Statistician. What immunizations do I need?  Influenza (flu) vaccine  This is recommended every year. Tetanus, diphtheria, and pertussis (Tdap) vaccine  You may need a Td booster every 10 years. Varicella (chickenpox) vaccine  You may need this vaccine if you have not already been vaccinated. Zoster (shingles) vaccine  You may need this after age 9. Pneumococcal conjugate (PCV13) vaccine  One dose is recommended after age 78. Pneumococcal polysaccharide (PPSV23) vaccine  One dose is recommended after age 22. Measles, mumps, and rubella (MMR) vaccine  You may need at least one dose of MMR if you were born in 1957 or later. You may also need a second dose. Meningococcal conjugate (MenACWY) vaccine  You may need this if you have certain conditions. Hepatitis A vaccine  You may need this if you  have certain conditions or if you travel or work in places where you may be exposed to hepatitis A. Hepatitis B vaccine  You may need this if you have certain conditions or if you travel or work in places where you may be exposed to hepatitis B. Haemophilus influenzae type b (Hib) vaccine  You may need this if you have certain conditions. You may receive vaccines as individual doses or as more than one vaccine together in one shot (combination vaccines). Talk with your health care provider about the risks and benefits of combination vaccines. What tests do I need? Blood tests  Lipid and cholesterol levels. These may be checked every 5 years, or more frequently depending on your overall health.  Hepatitis C test.  Hepatitis B test. Screening  Lung cancer screening. You may have this screening every year starting at age 23 if you have a 30-pack-year history of smoking and currently smoke or have quit within  the past 15 years.  Colorectal cancer screening. All adults should have this screening starting at age 96 and continuing until age 78. Your health care provider may recommend screening at age 39 if you are at increased risk. You will have tests every 1-10 years, depending on your results and the type of screening test.  Prostate cancer screening. Recommendations will vary depending on your family history and other risks.  Diabetes screening. This is done by checking your blood sugar (glucose) after you have not eaten for a while (fasting). You may have this done every 1-3 years.  Abdominal aortic aneurysm (AAA) screening. You may need this if you are a current or former smoker.  Sexually transmitted disease (STD) testing. Follow these instructions at home: Eating and drinking  Eat a diet that includes fresh fruits and vegetables, whole grains, lean protein, and low-fat dairy products. Limit your intake of foods with high amounts of sugar, saturated fats, and salt.  Take vitamin and  mineral supplements as recommended by your health care provider.  Do not drink alcohol if your health care provider tells you not to drink.  If you drink alcohol: ? Limit how much you have to 0-2 drinks a day. ? Be aware of how much alcohol is in your drink. In the [place] one drink equals one 12 oz bottle of beer (355 mL), one 5 oz glass of wine (148 mL), or one 1 oz glass of hard liquor (44 mL). Lifestyle  Take daily care of your teeth and gums.  Stay active. Exercise for at least 30 minutes on 5 or more days each week.  Do not use any products that contain nicotine or tobacco, such as cigarettes, e-cigarettes, and chewing tobacco. If you need help quitting, ask your health care provider.  If you are sexually active, practice safe sex. Use a condom or other form of protection to prevent STIs (sexually transmitted infections).  Talk with your health care provider about taking a low-dose aspirin or statin. What's next?  Visit your health care provider once a year for a well check visit.  Ask your health care provider how often you should have your eyes and teeth checked.  Stay up to date on all vaccines. This information is not intended to replace advice given to you by your health care provider. Make sure you discuss any questions you have with your health care provider. Document Released: 12/19/2015 Document Revised: 11/16/2018 Document Reviewed: 11/16/2018 Elsevier Patient Education  2020 [place], MD [facility]

## 2019-10-09 NOTE — Patient Instructions (Signed)
-Nice seeing you today!!  -Lab work today; will notify you once results are available.  -Flu vaccine today.  -Schedule 6 month follow up.   Preventive Care 48 Years and Older, Male Preventive care refers to lifestyle choices and visits with your health care provider that can promote health and wellness. This includes:  A yearly physical exam. This is also called an annual well check.  Regular dental and eye exams.  Immunizations.  Screening for certain conditions.  Healthy lifestyle choices, such as diet and exercise. What can I expect for my preventive care visit? Physical exam Your health care provider will check:  Height and weight. These may be used to calculate body mass index (BMI), which is a measurement that tells if you are at a healthy weight.  Heart rate and blood pressure.  Your skin for abnormal spots. Counseling Your health care provider may ask you questions about:  Alcohol, tobacco, and drug use.  Emotional well-being.  Home and relationship well-being.  Sexual activity.  Eating habits.  History of falls.  Memory and ability to understand (cognition).  Work and work Statistician. What immunizations do I need?  Influenza (flu) vaccine  This is recommended every year. Tetanus, diphtheria, and pertussis (Tdap) vaccine  You may need a Td booster every 10 years. Varicella (chickenpox) vaccine  You may need this vaccine if you have not already been vaccinated. Zoster (shingles) vaccine  You may need this after age 11. Pneumococcal conjugate (PCV13) vaccine  One dose is recommended after age 84. Pneumococcal polysaccharide (PPSV23) vaccine  One dose is recommended after age 107. Measles, mumps, and rubella (MMR) vaccine  You may need at least one dose of MMR if you were born in 1957 or later. You may also need a second dose. Meningococcal conjugate (MenACWY) vaccine  You may need this if you have certain conditions. Hepatitis A vaccine   You may need this if you have certain conditions or if you travel or work in places where you may be exposed to hepatitis A. Hepatitis B vaccine  You may need this if you have certain conditions or if you travel or work in places where you may be exposed to hepatitis B. Haemophilus influenzae type b (Hib) vaccine  You may need this if you have certain conditions. You may receive vaccines as individual doses or as more than one vaccine together in one shot (combination vaccines). Talk with your health care provider about the risks and benefits of combination vaccines. What tests do I need? Blood tests  Lipid and cholesterol levels. These may be checked every 5 years, or more frequently depending on your overall health.  Hepatitis C test.  Hepatitis B test. Screening  Lung cancer screening. You may have this screening every year starting at age 70 if you have a 30-pack-year history of smoking and currently smoke or have quit within the past 15 years.  Colorectal cancer screening. All adults should have this screening starting at age 57 and continuing until age 72. Your health care provider may recommend screening at age 106 if you are at increased risk. You will have tests every 1-10 years, depending on your results and the type of screening test.  Prostate cancer screening. Recommendations will vary depending on your family history and other risks.  Diabetes screening. This is done by checking your blood sugar (glucose) after you have not eaten for a while (fasting). You may have this done every 1-3 years.  Abdominal aortic aneurysm (AAA) screening. You  may need this if you are a current or former smoker.  Sexually transmitted disease (STD) testing. Follow these instructions at home: Eating and drinking  Eat a diet that includes fresh fruits and vegetables, whole grains, lean protein, and low-fat dairy products. Limit your intake of foods with high amounts of sugar, saturated fats,  and salt.  Take vitamin and mineral supplements as recommended by your health care provider.  Do not drink alcohol if your health care provider tells you not to drink.  If you drink alcohol: ? Limit how much you have to 0-2 drinks a day. ? Be aware of how much alcohol is in your drink. In the [place] one drink equals one 12 oz bottle of beer (355 mL), one 5 oz glass of wine (148 mL), or one 1 oz glass of hard liquor (44 mL). Lifestyle  Take daily care of your teeth and gums.  Stay active. Exercise for at least 30 minutes on 5 or more days each week.  Do not use any products that contain nicotine or tobacco, such as cigarettes, e-cigarettes, and chewing tobacco. If you need help quitting, ask your health care provider.  If you are sexually active, practice safe sex. Use a condom or other form of protection to prevent STIs (sexually transmitted infections).  Talk with your health care provider about taking a low-dose aspirin or statin. What's next?  Visit your health care provider once a year for a well check visit.  Ask your health care provider how often you should have your eyes and teeth checked.  Stay up to date on all vaccines. This information is not intended to replace advice given to you by your health care provider. Make sure you discuss any questions you have with your health care provider. Document Released: 12/19/2015 Document Revised: 11/16/2018 Document Reviewed: 11/16/2018 Elsevier Patient Education  2020 [employer].

## 2019-10-10 LAB — HEMOGLOBIN A1C: Hgb A1c MFr Bld: 5.3 % (ref 4.6–6.5)

## 2019-11-12 ENCOUNTER — Telehealth (INDEPENDENT_AMBULATORY_CARE_PROVIDER_SITE_OTHER): Payer: PPO | Admitting: Family Medicine

## 2019-11-12 ENCOUNTER — Telehealth: Payer: Self-pay | Admitting: Internal Medicine

## 2019-11-12 DIAGNOSIS — M545 Low back pain, unspecified: Secondary | ICD-10-CM

## 2019-11-12 DIAGNOSIS — M62838 Other muscle spasm: Secondary | ICD-10-CM | POA: Diagnosis not present

## 2019-11-12 MED ORDER — METHOCARBAMOL 500 MG PO TABS: 500.0000 mg | ORAL_TABLET | Freq: Three times a day (TID) | ORAL | 0 refills | Status: DC | PRN

## 2019-11-12 NOTE — Telephone Encounter (Signed)
Pt is in a lot of pain, he is having back spasms for 2 days, he is asking for call back and a muscle relaxer to be called in.  He has a dentist appt at 1 pm, would like a call back before then.  His pharm is [facility] [address] [place].   He is asking for call back this am from either Dr Jerilee Hoh or Apolonio Schneiders.  Please call [phone]

## 2019-11-12 NOTE — Progress Notes (Signed)
Virtual Visit via Video Note  I connected with Ruben Chavez on 11/12/19 at  3:30 PM EST by a video enabled telemedicine application 2/2 [id number] pandemic and verified that I am speaking with the correct person using two identifiers.  Location patient: home Location provider:work or home office Persons participating in the virtual visit: patient, provider  I discussed the limitations of evaluation and management by telemedicine and the availability of in person appointments. The patient expressed understanding and agreed to proceed.   HPI: Pt states 1 wk ago when got out of bed had soreness and tingling in lower medial R ankle.  Got better with moving around.  Has noticed this for several days.  Denies doing anything out of the ordinary.  Pt lifted a somewhat heavy box of Christmas decorations, then days later developed pain in lower mid left back.   Back pain present x 3 days.  "Feels like a horse kicked me in the kidney".  Pain causing muscle spasms.  Sitting in a chair feels better, but moving around is extremely painful.  Denies fever, chills, n/v, dysuria, hematuria, h/o kidney stones.  Drinking a couple of glasses of water per day.    ROS: See pertinent positives and negatives per HPI.  Past Medical History:  Diagnosis Date  . Elevated PSA   . Hyperlipidemia    pt says not anymore  . Hypertension   . Prostate cancer ([place]) 2013    Past Surgical History:  Procedure Laterality Date  . bilateral inguinal hernia  2008  . CYST REMOVAL TRUNK  2016   sebaceous cyst  . CYSTOSCOPY  04/22/2016   Procedure: CYSTOSCOPY;  Surgeon: [name], MD;  Location: [facility];  Service: Urology;;  . KNEE ARTHROSCOPY  2007   right  . PROSTATE BIOPSY  2008  . PROSTATE BIOPSY  2013  . PROSTATE BIOPSY  01/28/2016  . RADIOACTIVE SEED IMPLANT N/A 04/22/2016   Procedure: RADIOACTIVE SEED IMPLANT/BRACHYTHERAPY IMPLANT;  Surgeon: [name], MD;  Location: [facility];  Service: Urology;  Laterality: N/A;  . TONSILLECTOMY    . TOTAL HIP ARTHROPLASTY  2001   left  . TOTAL HIP ARTHROPLASTY Right 01/18/2018   Procedure: RIGHT TOTAL HIP ARTHROPLASTY ANTERIOR APPROACH;  Surgeon: [name], MD;  Location: [facility];  Service: Orthopedics;  Laterality: Right;    Family History  Problem Relation Age of Onset  . Pancreatic cancer Mother   . Heart disease Father   . Colon cancer Neg Hx     Current Outpatient Medications:  .  HYDROcodone-acetaminophen (NORCO/VICODIN) 5-325 MG tablet, Take 1 tablet by mouth every 6 (six) hours as needed for moderate pain., Disp: 30 tablet, Rfl: 0 .  lisinopril-hydrochlorothiazide (ZESTORETIC) 20-25 MG tablet, Take 1 tablet by mouth daily., Disp: 90 tablet, Rfl: 1  EXAM:  VITALS per patient if applicable: RR between 123456 bpm  GENERAL: alert, oriented, appears well and in no acute distress  HEENT: atraumatic, conjunctiva clear, no obvious abnormalities on inspection of external nose and ears  NECK: normal movements of the head and neck  LUNGS: on inspection no signs of respiratory distress, breathing rate appears normal, no obvious gross SOB, gasping or wheezing  CV: no obvious cyanosis  MS: moves all visible extremities without noticeable abnormality  PSYCH/NEURO: pleasant and cooperative, no obvious depression or anxiety, speech and thought processing grossly intact  ASSESSMENT AND PLAN:  Discussed the following assessment and plan:  Acute left-sided low back pain without sciatica  -discussed  possible causes including UTI, renal calculi, Pyelonephritis, muscle strain -pt strongly advised to increase po intake of water and fluids. -ok to use regular prescription for Norco 5-325 mg prn for current back pain or tylenol. --CT stone study order as UA with 1+bilrubin, 2+ketones, protein.  No RBCs or leuks  - Plan: POC Urinalysis Dipstick  Muscle spasm  - Plan: methocarbamol (ROBAXIN) 500 MG  tablet  F/u prn with pcp   I discussed the assessment and treatment plan with the patient. The patient was provided an opportunity to ask questions and all were answered. The patient agreed with the plan and demonstrated an understanding of the instructions.   The patient was advised to call back or seek an in-person evaluation if the symptoms worsen or if the condition fails to improve as anticipated.  Billie Ruddy, MD

## 2019-11-13 ENCOUNTER — Other Ambulatory Visit (INDEPENDENT_AMBULATORY_CARE_PROVIDER_SITE_OTHER): Payer: PPO

## 2019-11-13 ENCOUNTER — Other Ambulatory Visit: Payer: Self-pay

## 2019-11-13 DIAGNOSIS — M545 Low back pain, unspecified: Secondary | ICD-10-CM

## 2019-11-13 LAB — POCT URINALYSIS DIPSTICK
Glucose, UA: NEGATIVE
Leukocytes, UA: NEGATIVE
Nitrite, UA: 0.2
Protein, UA: POSITIVE — AB
Spec Grav, UA: 1.025 (ref 1.010–1.025)
Urobilinogen, UA: 0.2 E.U./dL
pH, UA: 6 (ref 5.0–8.0)

## 2019-11-13 NOTE — Telephone Encounter (Signed)
Yes, please schedule visit.

## 2019-11-14 ENCOUNTER — Telehealth: Payer: Self-pay | Admitting: Family Medicine

## 2019-11-14 ENCOUNTER — Ambulatory Visit
Admission: RE | Admit: 2019-11-14 | Discharge: 2019-11-14 | Disposition: A | Discharge status: Home / Self Care | Payer: PPO | Source: Ambulatory Visit | Attending: Family Medicine | Admitting: Family Medicine

## 2019-11-14 ENCOUNTER — Other Ambulatory Visit: Payer: Self-pay | Admitting: Family Medicine

## 2019-11-14 DIAGNOSIS — N200 Calculus of kidney: Secondary | ICD-10-CM

## 2019-11-14 DIAGNOSIS — M545 Low back pain, unspecified: Secondary | ICD-10-CM

## 2019-11-14 MED ORDER — SILODOSIN 4 MG PO CAPS: 4.0000 mg | ORAL_CAPSULE | Freq: Every day | ORAL | 0 refills | Status: AC

## 2019-11-14 NOTE — Telephone Encounter (Signed)
Please advise 

## 2019-11-14 NOTE — Telephone Encounter (Signed)
See result note.  

## 2019-11-14 NOTE — Telephone Encounter (Signed)
Copied from [place] [id number]. Topic: General - Other >> Nov 14, 2019  9:48 AM Rainey Pines A wrote: Patient stated that Dr. Volanda Napoleon called and left vm wanting to speak with patient on how he was doing. Patient would like a callback to speak with Dr. Volanda Napoleon directly. Please advise

## 2019-11-14 NOTE — Telephone Encounter (Signed)
Spoke with patient. Saw Dr. Volanda Napoleon for this. Has a CT scheduled at [facility].  Will send to Dr. Jerilee Hoh as Juluis Rainier

## 2019-11-15 NOTE — Telephone Encounter (Signed)
Please advise pt would like to talk with pcp

## 2019-11-15 NOTE — Telephone Encounter (Signed)
Copied from [place] [id number]. Topic: General - Call Back - No Documentation >> Nov 14, 2019  6:35 PM Erick Blinks wrote: Reason for CRM: Pt is requesting a call back from PCP, please advise Best contact: [phone]

## 2019-11-15 NOTE — Telephone Encounter (Signed)
CT and current work up for back pain has been done by Dr. Volanda Napoleon. Since I have not seen him for this issue, I would recommend referring back to Dr. Volanda Napoleon, or he can schedule a visit to discuss his complaints. [place]

## 2019-11-15 NOTE — Telephone Encounter (Signed)
Pt wants to discuss the CAT scan and results he sees in [place], he states he also has significant pain in his right ankle.

## 2019-11-16 ENCOUNTER — Telehealth (INDEPENDENT_AMBULATORY_CARE_PROVIDER_SITE_OTHER): Payer: PPO | Admitting: Internal Medicine

## 2019-11-16 ENCOUNTER — Other Ambulatory Visit: Payer: Self-pay

## 2019-11-16 DIAGNOSIS — N200 Calculus of kidney: Secondary | ICD-10-CM | POA: Diagnosis not present

## 2019-11-16 DIAGNOSIS — M169 Osteoarthritis of hip, unspecified: Secondary | ICD-10-CM | POA: Diagnosis not present

## 2019-11-16 DIAGNOSIS — M79605 Pain in left leg: Secondary | ICD-10-CM | POA: Diagnosis not present

## 2019-11-16 DIAGNOSIS — M545 Low back pain, unspecified: Secondary | ICD-10-CM

## 2019-11-16 MED ORDER — HYDROCODONE-ACETAMINOPHEN 5-325 MG PO TABS: 1.0000 | ORAL_TABLET | Freq: Four times a day (QID) | ORAL | 0 refills | Status: DC | PRN

## 2019-11-16 NOTE — Telephone Encounter (Signed)
Noted! Thank you

## 2019-11-16 NOTE — Telephone Encounter (Signed)
Please advise. Would Dr. Volanda Napoleon like to follow up with this patient?

## 2019-11-16 NOTE — Progress Notes (Signed)
Virtual Visit via Video Note  I connected with Ruben Chavez on 11/16/19 at  4:15 PM EST by a video enabled telemedicine application and verified that I am speaking with the correct person using two identifiers.  Location patient: home Location provider: work office Persons participating in the virtual visit: patient, provider  I discussed the limitations of evaluation and management by telemedicine and the availability of in person appointments. The patient expressed understanding and agreed to proceed.   HPI: He has scheduled this visit to discuss some acute concerns  1.  On Monday he started having "excruciating left mid back pain" this radiated in front towards his groin.  He had to cancel a dental procedure he has scheduled for that day.  He saw another provider in the office that day who ordered some Robaxin and had them come in the next day for a urine test, based on that he was sent for a CT scan that showed a nonobstructing 2 mm left renal calculus without hydroureter.  He was advised to take Rapaflo and increase fluid consumption.  Back pain has improved but still "pretty bad".  He has used most of the Vicodin that I have prescribed at last visit.  Usually 30 tablets will last a whole year.  2.  He has been having pain on the inside of his right lower leg about 3 to 4 inches above his ankle both.  He states it feels as if the skin is burning.  He denies any injuries, his foot is not red or swollen.   ROS: Constitutional: Denies fever, chills, diaphoresis, appetite change and fatigue.  HEENT: Denies photophobia, eye pain, redness, hearing loss, ear pain, congestion, sore throat, rhinorrhea, sneezing, mouth sores, trouble swallowing, neck pain, neck stiffness and tinnitus.   Respiratory: Denies SOB, DOE, cough, chest tightness,  and wheezing.   Cardiovascular: Denies chest pain, palpitations and leg swelling.  Gastrointestinal: Denies nausea, vomiting, abdominal pain,  diarrhea, constipation, blood in stool and abdominal distention.  Genitourinary: Denies dysuria, urgency, frequency, hematuria, flank pain and difficulty urinating.  Endocrine: Denies: hot or cold intolerance, sweats, changes in hair or nails, polyuria, polydipsia. Musculoskeletal: Denies myalgias, back pain, joint swelling, arthralgias and gait problem.  Skin: Denies pallor, rash and wound.  Neurological: Denies dizziness, seizures, syncope, weakness, light-headedness, numbness and headaches.  Hematological: Denies adenopathy. Easy bruising, personal or family bleeding history  Psychiatric/Behavioral: Denies suicidal ideation, mood changes, confusion, nervousness, sleep disturbance and agitation   Past Medical History:  Diagnosis Date  . Elevated PSA   . Hyperlipidemia    pt says not anymore  . Hypertension   . Prostate cancer ([place]) 2013    Past Surgical History:  Procedure Laterality Date  . bilateral inguinal hernia  2008  . CYST REMOVAL TRUNK  2016   sebaceous cyst  . CYSTOSCOPY  04/22/2016   Procedure: CYSTOSCOPY;  Surgeon: [name], MD;  Location: [facility];  Service: Urology;;  . KNEE ARTHROSCOPY  2007   right  . PROSTATE BIOPSY  2008  . PROSTATE BIOPSY  2013  . PROSTATE BIOPSY  01/28/2016  . RADIOACTIVE SEED IMPLANT N/A 04/22/2016   Procedure: RADIOACTIVE SEED IMPLANT/BRACHYTHERAPY IMPLANT;  Surgeon: [name], MD;  Location: [facility];  Service: Urology;  Laterality: N/A;  . TONSILLECTOMY    . TOTAL HIP ARTHROPLASTY  2001   left  . TOTAL HIP ARTHROPLASTY Right 01/18/2018   Procedure: RIGHT TOTAL HIP ARTHROPLASTY ANTERIOR APPROACH;  Surgeon: Wynelle Link,  Pilar Plate, MD;  Location: [facility];  Service: Orthopedics;  Laterality: Right;    Family History  Problem Relation Age of Onset  . Pancreatic cancer Mother   . Heart disease Father   . Colon cancer Neg Hx     SOCIAL HX:   reports that he quit smoking about 20 years ago. His  smoking use included cigarettes. He has a 32.00 pack-year smoking history. He has never used smokeless tobacco. He reports current alcohol use of about 14.0 standard drinks of alcohol per week. He reports that he does not use drugs.   Current Outpatient Medications:  .  HYDROcodone-acetaminophen (NORCO/VICODIN) 5-325 MG tablet, Take 1 tablet by mouth every 6 (six) hours as needed for moderate pain., Disp: 30 tablet, Rfl: 0 .  lisinopril-hydrochlorothiazide (ZESTORETIC) 20-25 MG tablet, Take 1 tablet by mouth daily., Disp: 90 tablet, Rfl: 1 .  methocarbamol (ROBAXIN) 500 MG tablet, Take 1 tablet (500 mg total) by mouth every 8 (eight) hours as needed for muscle spasms., Disp: 30 tablet, Rfl: 0 .  silodosin (RAPAFLO) 4 MG CAPS capsule, Take 1 capsule (4 mg total) by mouth daily with breakfast for 10 days., Disp: 10 capsule, Rfl: 0  EXAM:   VITALS per patient if applicable: None reported  GENERAL: alert, oriented, appears well and in no acute distress  HEENT: atraumatic, conjunttiva clear, no obvious abnormalities on inspection of external nose and ears  NECK: normal movements of the head and neck  LUNGS: on inspection no signs of respiratory distress, breathing rate appears normal, no obvious gross increased work of breathing, gasping or wheezing  CV: no obvious cyanosis  MS: moves all visible extremities without noticeable abnormality  PSYCH/NEURO: pleasant and cooperative, no obvious depression or anxiety, speech and thought processing grossly intact  ASSESSMENT AND PLAN:   Nephrolithiasis Acute left-sided low back pain without sciatica -Suspect this to be the cause of his pain.  I believe he may have passed a stone on Monday night when the pain was at its worst.  I do not think that a 2 mm nonobstructing stone should not cause him any issues, do not believe nephrology referral is needed at this time. -Have advised that he continue to take copious amount of fluids, avoid carbonated  beverages and we will send another 30 tablets of Vicodin for him to use as needed.  Left leg pain -Hard to determine on video chat, but he states it is not the ankle joint so doubt gout, he denies any injuries erythema or edema.  Wonder if he may have some posterior tibial tendinitis. -Have advised for now icing, as needed NSAIDs, bracing and follow-up if no improvement.      I discussed the assessment and treatment plan with the patient. The patient was provided an opportunity to ask questions and all were answered. The patient agreed with the plan and demonstrated an understanding of the instructions.   The patient was advised to call back or seek an in-person evaluation if the symptoms worsen or if the condition fails to improve as anticipated.    Lelon Frohlich, MD  [facility]

## 2019-11-16 NOTE — Telephone Encounter (Signed)
Unfortunately, this provider is seeing pts all day, so will not be calling pt by noon.  Pt was called by this provider a few days ago, but he did not answer the phone.   A result note was left and pt was to be contacted regarding his results.  It appears he looked on mychart and reviewed the result note.  Pt was advised to increase fluid intake and start rx for flomax for the 2 mm stone noted on CT.  Pt is on a pain contract and has opioid pain meds that he can take.  For worsening symptoms pt should proceed to the nearest ED.  For any further issues pt advised to f/u with his pcp.

## 2019-11-16 NOTE — Telephone Encounter (Signed)
Patient called and said that he would like a call back from Dr. Volanda Napoleon by noon today about his CT scan because he has additional questions about it. Patient is upset because he has left multiple messages in the last 48 hrs and has not got a call back. Please call patient back, thanks.

## 2019-11-16 NOTE — Telephone Encounter (Signed)
Clinic RN spoke with patient. Advised him of message below. Patient had multiple questions and concerns. Scheduled patient an appointment with PCP to discuss concerns about CT scan and acute issue

## 2019-11-16 NOTE — Telephone Encounter (Signed)
Pt was seen by this provider for an acute visit as his pcp was not available.  Pt can schedule an appt with his pcp to f/u and further discussed his ankle issue.

## 2019-12-05 ENCOUNTER — Telehealth: Payer: Self-pay | Admitting: *Deleted

## 2019-12-05 NOTE — Telephone Encounter (Signed)
Prior auth started for   methocarbamol (ROBAXIN) 500 MG tablet  Key: [id number]

## 2020-01-24 ENCOUNTER — Emergency Department (HOSPITAL_COMMUNITY): Payer: PPO

## 2020-01-24 ENCOUNTER — Other Ambulatory Visit: Payer: Self-pay

## 2020-01-24 ENCOUNTER — Encounter (HOSPITAL_COMMUNITY)
Admission: EM | Disposition: A | Discharge status: Rehabilitation Facility | Payer: Self-pay | Source: Home / Self Care | Attending: Neurology

## 2020-01-24 ENCOUNTER — Emergency Department (HOSPITAL_COMMUNITY): Payer: PPO | Admitting: Anesthesiology

## 2020-01-24 ENCOUNTER — Inpatient Hospital Stay (HOSPITAL_COMMUNITY)
Admission: EM | Admit: 2020-01-24 | Discharge: 2020-01-28 | DRG: 023 | Disposition: A | Discharge status: Rehabilitation Facility | Payer: PPO | Attending: Neurology | Admitting: Neurology

## 2020-01-24 ENCOUNTER — Encounter (HOSPITAL_COMMUNITY): Payer: Self-pay

## 2020-01-24 DIAGNOSIS — R414 Neurologic neglect syndrome: Secondary | ICD-10-CM | POA: Diagnosis not present

## 2020-01-24 DIAGNOSIS — H5340 Unspecified visual field defects: Secondary | ICD-10-CM | POA: Diagnosis present

## 2020-01-24 DIAGNOSIS — Z20822 Contact with and (suspected) exposure to covid-19: Secondary | ICD-10-CM | POA: Diagnosis not present

## 2020-01-24 DIAGNOSIS — R4189 Other symptoms and signs involving cognitive functions and awareness: Secondary | ICD-10-CM | POA: Diagnosis present

## 2020-01-24 DIAGNOSIS — I951 Orthostatic hypotension: Secondary | ICD-10-CM | POA: Diagnosis not present

## 2020-01-24 DIAGNOSIS — I63411 Cerebral infarction due to embolism of right middle cerebral artery: Secondary | ICD-10-CM | POA: Diagnosis not present

## 2020-01-24 DIAGNOSIS — R339 Retention of urine, unspecified: Secondary | ICD-10-CM | POA: Diagnosis not present

## 2020-01-24 DIAGNOSIS — I952 Hypotension due to drugs: Secondary | ICD-10-CM | POA: Diagnosis not present

## 2020-01-24 DIAGNOSIS — R471 Dysarthria and anarthria: Secondary | ICD-10-CM | POA: Diagnosis not present

## 2020-01-24 DIAGNOSIS — R29810 Facial weakness: Secondary | ICD-10-CM | POA: Diagnosis present

## 2020-01-24 DIAGNOSIS — J3489 Other specified disorders of nose and nasal sinuses: Secondary | ICD-10-CM | POA: Diagnosis not present

## 2020-01-24 DIAGNOSIS — R29723 NIHSS score 23: Secondary | ICD-10-CM | POA: Diagnosis not present

## 2020-01-24 DIAGNOSIS — R29729 NIHSS score 29: Secondary | ICD-10-CM | POA: Diagnosis not present

## 2020-01-24 DIAGNOSIS — I639 Cerebral infarction, unspecified: Secondary | ICD-10-CM | POA: Diagnosis not present

## 2020-01-24 DIAGNOSIS — M7541 Impingement syndrome of right shoulder: Secondary | ICD-10-CM | POA: Diagnosis not present

## 2020-01-24 DIAGNOSIS — F0789 Other personality and behavioral disorders due to known physiological condition: Secondary | ICD-10-CM | POA: Diagnosis not present

## 2020-01-24 DIAGNOSIS — I63519 Cerebral infarction due to unspecified occlusion or stenosis of unspecified middle cerebral artery: Secondary | ICD-10-CM | POA: Diagnosis not present

## 2020-01-24 DIAGNOSIS — Z96643 Presence of artificial hip joint, bilateral: Secondary | ICD-10-CM | POA: Diagnosis not present

## 2020-01-24 DIAGNOSIS — R000 Tachycardia, unspecified: Secondary | ICD-10-CM | POA: Diagnosis not present

## 2020-01-24 DIAGNOSIS — C61 Malignant neoplasm of prostate: Secondary | ICD-10-CM | POA: Diagnosis not present

## 2020-01-24 DIAGNOSIS — I618 Other nontraumatic intracerebral hemorrhage: Secondary | ICD-10-CM | POA: Diagnosis not present

## 2020-01-24 DIAGNOSIS — I69322 Dysarthria following cerebral infarction: Secondary | ICD-10-CM | POA: Diagnosis not present

## 2020-01-24 DIAGNOSIS — R29711 NIHSS score 11: Secondary | ICD-10-CM | POA: Diagnosis not present

## 2020-01-24 DIAGNOSIS — I371 Nonrheumatic pulmonary valve insufficiency: Secondary | ICD-10-CM | POA: Diagnosis not present

## 2020-01-24 DIAGNOSIS — Z96641 Presence of right artificial hip joint: Secondary | ICD-10-CM | POA: Diagnosis not present

## 2020-01-24 DIAGNOSIS — E785 Hyperlipidemia, unspecified: Secondary | ICD-10-CM | POA: Diagnosis present

## 2020-01-24 DIAGNOSIS — R29712 NIHSS score 12: Secondary | ICD-10-CM | POA: Diagnosis not present

## 2020-01-24 DIAGNOSIS — I69354 Hemiplegia and hemiparesis following cerebral infarction affecting left non-dominant side: Secondary | ICD-10-CM | POA: Diagnosis not present

## 2020-01-24 DIAGNOSIS — R4781 Slurred speech: Secondary | ICD-10-CM | POA: Diagnosis not present

## 2020-01-24 DIAGNOSIS — R1312 Dysphagia, oropharyngeal phase: Secondary | ICD-10-CM | POA: Diagnosis not present

## 2020-01-24 DIAGNOSIS — E876 Hypokalemia: Secondary | ICD-10-CM | POA: Diagnosis present

## 2020-01-24 DIAGNOSIS — G9349 Other encephalopathy: Secondary | ICD-10-CM | POA: Diagnosis not present

## 2020-01-24 DIAGNOSIS — G459 Transient cerebral ischemic attack, unspecified: Secondary | ICD-10-CM | POA: Diagnosis not present

## 2020-01-24 DIAGNOSIS — G8194 Hemiplegia, unspecified affecting left nondominant side: Secondary | ICD-10-CM | POA: Diagnosis not present

## 2020-01-24 DIAGNOSIS — I160 Hypertensive urgency: Secondary | ICD-10-CM | POA: Diagnosis present

## 2020-01-24 DIAGNOSIS — R29716 NIHSS score 16: Secondary | ICD-10-CM | POA: Diagnosis not present

## 2020-01-24 DIAGNOSIS — J9601 Acute respiratory failure with hypoxia: Secondary | ICD-10-CM | POA: Diagnosis not present

## 2020-01-24 DIAGNOSIS — I10 Essential (primary) hypertension: Secondary | ICD-10-CM | POA: Diagnosis present

## 2020-01-24 DIAGNOSIS — Z882 Allergy status to sulfonamides status: Secondary | ICD-10-CM

## 2020-01-24 DIAGNOSIS — I6523 Occlusion and stenosis of bilateral carotid arteries: Secondary | ICD-10-CM | POA: Diagnosis not present

## 2020-01-24 DIAGNOSIS — G9340 Encephalopathy, unspecified: Secondary | ICD-10-CM | POA: Diagnosis present

## 2020-01-24 DIAGNOSIS — I69392 Facial weakness following cerebral infarction: Secondary | ICD-10-CM | POA: Diagnosis not present

## 2020-01-24 DIAGNOSIS — F329 Major depressive disorder, single episode, unspecified: Secondary | ICD-10-CM | POA: Diagnosis not present

## 2020-01-24 DIAGNOSIS — I63511 Cerebral infarction due to unspecified occlusion or stenosis of right middle cerebral artery: Secondary | ICD-10-CM | POA: Diagnosis not present

## 2020-01-24 DIAGNOSIS — I6601 Occlusion and stenosis of right middle cerebral artery: Secondary | ICD-10-CM | POA: Diagnosis present

## 2020-01-24 DIAGNOSIS — I959 Hypotension, unspecified: Secondary | ICD-10-CM

## 2020-01-24 DIAGNOSIS — F09 Unspecified mental disorder due to known physiological condition: Secondary | ICD-10-CM | POA: Diagnosis not present

## 2020-01-24 DIAGNOSIS — Z7982 Long term (current) use of aspirin: Secondary | ICD-10-CM | POA: Diagnosis not present

## 2020-01-24 DIAGNOSIS — J9602 Acute respiratory failure with hypercapnia: Secondary | ICD-10-CM | POA: Diagnosis not present

## 2020-01-24 DIAGNOSIS — I6389 Other cerebral infarction: Secondary | ICD-10-CM | POA: Diagnosis not present

## 2020-01-24 DIAGNOSIS — Z888 Allergy status to other drugs, medicaments and biological substances status: Secondary | ICD-10-CM

## 2020-01-24 DIAGNOSIS — D170 Benign lipomatous neoplasm of skin and subcutaneous tissue of head, face and neck: Secondary | ICD-10-CM | POA: Diagnosis not present

## 2020-01-24 DIAGNOSIS — Z87891 Personal history of nicotine dependence: Secondary | ICD-10-CM | POA: Diagnosis not present

## 2020-01-24 DIAGNOSIS — R21 Rash and other nonspecific skin eruption: Secondary | ICD-10-CM | POA: Diagnosis not present

## 2020-01-24 DIAGNOSIS — Z8546 Personal history of malignant neoplasm of prostate: Secondary | ICD-10-CM

## 2020-01-24 DIAGNOSIS — Z79899 Other long term (current) drug therapy: Secondary | ICD-10-CM | POA: Diagnosis not present

## 2020-01-24 DIAGNOSIS — Z8249 Family history of ischemic heart disease and other diseases of the circulatory system: Secondary | ICD-10-CM

## 2020-01-24 DIAGNOSIS — K5900 Constipation, unspecified: Secondary | ICD-10-CM | POA: Diagnosis not present

## 2020-01-24 DIAGNOSIS — I071 Rheumatic tricuspid insufficiency: Secondary | ICD-10-CM | POA: Diagnosis not present

## 2020-01-24 HISTORY — PX: RADIOLOGY WITH ANESTHESIA: SHX6223

## 2020-01-24 LAB — APTT: aPTT: 27 seconds (ref 24–36)

## 2020-01-24 LAB — DIFFERENTIAL
Abs Immature Granulocytes: 0.01 10*3/uL (ref 0.00–0.07)
Basophils Absolute: 0 10*3/uL (ref 0.0–0.1)
Basophils Relative: 0 %
Eosinophils Absolute: 0.2 10*3/uL (ref 0.0–0.5)
Eosinophils Relative: 2 %
Immature Granulocytes: 0 %
Lymphocytes Relative: 23 %
Lymphs Abs: 1.7 10*3/uL (ref 0.7–4.0)
Monocytes Absolute: 0.9 10*3/uL (ref 0.1–1.0)
Monocytes Relative: 12 %
Neutro Abs: 4.7 10*3/uL (ref 1.7–7.7)
Neutrophils Relative %: 63 %

## 2020-01-24 LAB — PROTIME-INR
INR: 1 (ref 0.8–1.2)
Prothrombin Time: 12.9 seconds (ref 11.4–15.2)

## 2020-01-24 LAB — CBG MONITORING, ED: Glucose-Capillary: 89 mg/dL (ref 70–99)

## 2020-01-24 LAB — I-STAT CHEM 8, ED
BUN: 20 mg/dL (ref 8–23)
Calcium, Ion: 1.12 mmol/L — ABNORMAL LOW (ref 1.15–1.40)
Chloride: 104 mmol/L (ref 98–111)
Creatinine, Ser: 0.8 mg/dL (ref 0.61–1.24)
Glucose, Bld: 96 mg/dL (ref 70–99)
HCT: 46 % (ref 39.0–52.0)
Hemoglobin: 15.6 g/dL (ref 13.0–17.0)
Potassium: 3.4 mmol/L — ABNORMAL LOW (ref 3.5–5.1)
Sodium: 140 mmol/L (ref 135–145)
TCO2: 28 mmol/L (ref 22–32)

## 2020-01-24 LAB — COMPREHENSIVE METABOLIC PANEL
ALT: 55 U/L — ABNORMAL HIGH (ref 0–44)
AST: 50 U/L — ABNORMAL HIGH (ref 15–41)
Albumin: 4.2 g/dL (ref 3.5–5.0)
Alkaline Phosphatase: 40 U/L (ref 38–126)
Anion gap: 15 (ref 5–15)
BUN: 17 mg/dL (ref 8–23)
CO2: 20 mmol/L — ABNORMAL LOW (ref 22–32)
Calcium: 9.7 mg/dL (ref 8.9–10.3)
Chloride: 104 mmol/L (ref 98–111)
Creatinine, Ser: 0.95 mg/dL (ref 0.61–1.24)
GFR calc Af Amer: >60 mL/min (ref >60)
GFR calc non Af Amer: >60 mL/min (ref >60)
Glucose, Bld: 103 mg/dL — ABNORMAL HIGH (ref 70–99)
Potassium: 3.5 mmol/L (ref 3.5–5.1)
Sodium: 139 mmol/L (ref 135–145)
Total Bilirubin: 1.1 mg/dL (ref 0.3–1.2)
Total Protein: 7.3 g/dL (ref 6.5–8.1)

## 2020-01-24 LAB — RESPIRATORY PANEL BY RT PCR (FLU A&B, COVID)
Influenza A by PCR: NEGATIVE
Influenza B by PCR: NEGATIVE
SARS Coronavirus 2 by RT PCR: NEGATIVE

## 2020-01-24 LAB — CBC
HCT: 43.2 % (ref 39.0–52.0)
Hemoglobin: 14.6 g/dL (ref 13.0–17.0)
MCH: 32 pg (ref 26.0–34.0)
MCHC: 33.8 g/dL (ref 30.0–36.0)
MCV: 94.7 fL (ref 80.0–100.0)
Platelets: 264 10*3/uL (ref 150–400)
RBC: 4.56 MIL/uL (ref 4.22–5.81)
RDW: 14.2 % (ref 11.5–15.5)
WBC: 7.4 10*3/uL (ref 4.0–10.5)
nRBC: 0 % (ref 0.0–0.2)

## 2020-01-24 LAB — ETHANOL: Alcohol, Ethyl (B): <10 mg/dL (ref <10)

## 2020-01-24 SURGERY — IR WITH ANESTHESIA
Anesthesia: General

## 2020-01-24 MED ORDER — GLYCOPYRROLATE 0.2 MG/ML IJ SOLN
INTRAMUSCULAR | Status: DC | PRN
  Administered 2020-01-24: .2 mg via INTRAVENOUS

## 2020-01-24 MED ORDER — ALTEPLASE (STROKE) FULL DOSE INFUSION
0.9000 mg/kg | Freq: Once | INTRAVENOUS | Status: AC
  Administered 2020-01-24: 73.4 mg via INTRAVENOUS
  Filled 2020-01-24: qty 100

## 2020-01-24 MED ORDER — IOHEXOL 350 MG/ML SOLN
40.0000 mL | Freq: Once | INTRAVENOUS | Status: AC | PRN
  Administered 2020-01-24: 40 mL via INTRAVENOUS

## 2020-01-24 MED ORDER — SUCCINYLCHOLINE CHLORIDE 20 MG/ML IJ SOLN
INTRAMUSCULAR | Status: DC | PRN
  Administered 2020-01-24: 120 mg via INTRAVENOUS

## 2020-01-24 MED ORDER — LACTATED RINGERS IV SOLN: INTRAVENOUS | Status: DC | PRN

## 2020-01-24 MED ORDER — FENTANYL CITRATE (PF) 100 MCG/2ML IJ SOLN
INTRAMUSCULAR | Status: DC | PRN
  Administered 2020-01-24 (×2): 25 ug via INTRAVENOUS
  Administered 2020-01-24: 50 ug via INTRAVENOUS

## 2020-01-24 MED ORDER — ONDANSETRON HCL 4 MG/2ML IJ SOLN
INTRAMUSCULAR | Status: DC | PRN
  Administered 2020-01-24: 4 mg via INTRAVENOUS

## 2020-01-24 MED ORDER — EPTIFIBATIDE 20 MG/10ML IV SOLN
INTRAVENOUS | Status: AC
  Filled 2020-01-24: qty 10

## 2020-01-24 MED ORDER — ACETAMINOPHEN 650 MG RE SUPP: 650.0000 mg | RECTAL | Status: DC | PRN

## 2020-01-24 MED ORDER — FENTANYL CITRATE (PF) 100 MCG/2ML IJ SOLN
INTRAMUSCULAR | Status: AC
  Filled 2020-01-24: qty 2

## 2020-01-24 MED ORDER — ROCURONIUM BROMIDE 50 MG/5ML IV SOSY
PREFILLED_SYRINGE | INTRAVENOUS | Status: DC | PRN
  Administered 2020-01-24 (×3): 20 mg via INTRAVENOUS
  Administered 2020-01-24: 30 mg via INTRAVENOUS
  Administered 2020-01-24: 10 mg via INTRAVENOUS

## 2020-01-24 MED ORDER — NITROGLYCERIN 1 MG/10 ML FOR IR/CATH LAB
INTRA_ARTERIAL | Status: AC
  Filled 2020-01-24: qty 10

## 2020-01-24 MED ORDER — CLEVIDIPINE BUTYRATE 0.5 MG/ML IV EMUL
0.0000 mg/h | INTRAVENOUS | Status: DC
  Administered 2020-01-25: 2 mg/h via INTRAVENOUS
  Filled 2020-01-24: qty 50

## 2020-01-24 MED ORDER — ASPIRIN 81 MG PO CHEW
CHEWABLE_TABLET | ORAL | Status: AC
  Filled 2020-01-24: qty 1

## 2020-01-24 MED ORDER — VERAPAMIL HCL 2.5 MG/ML IV SOLN
INTRAVENOUS | Status: AC
  Filled 2020-01-24: qty 2

## 2020-01-24 MED ORDER — IOHEXOL 350 MG/ML SOLN
75.0000 mL | Freq: Once | INTRAVENOUS | Status: AC | PRN
  Administered 2020-01-24: 75 mL via INTRAVENOUS

## 2020-01-24 MED ORDER — LIDOCAINE 2% (20 MG/ML) 5 ML SYRINGE
INTRAMUSCULAR | Status: DC | PRN
  Administered 2020-01-24: 60 mg via INTRAVENOUS

## 2020-01-24 MED ORDER — VASOPRESSIN 20 UNIT/ML IV SOLN
INTRAVENOUS | Status: DC | PRN
  Administered 2020-01-24: 2 [IU] via INTRAVENOUS

## 2020-01-24 MED ORDER — ACETAMINOPHEN 160 MG/5ML PO SOLN: 650.0000 mg | ORAL | Status: DC | PRN

## 2020-01-24 MED ORDER — PHENYLEPHRINE HCL-NACL 10-0.9 MG/250ML-% IV SOLN
INTRAVENOUS | Status: DC | PRN
  Administered 2020-01-24: 100 ug/min via INTRAVENOUS

## 2020-01-24 MED ORDER — PROPOFOL 10 MG/ML IV BOLUS
INTRAVENOUS | Status: DC | PRN
  Administered 2020-01-24: 200 mg via INTRAVENOUS

## 2020-01-24 MED ORDER — SODIUM CHLORIDE 0.9 % IV SOLN
50.0000 mL | Freq: Once | INTRAVENOUS | Status: AC
  Administered 2020-01-24: 50 mL via INTRAVENOUS

## 2020-01-24 MED ORDER — CEFAZOLIN SODIUM-DEXTROSE 2-4 GM/100ML-% IV SOLN
INTRAVENOUS | Status: AC
  Filled 2020-01-24: qty 100

## 2020-01-24 MED ORDER — TIROFIBAN HCL IN NACL 5-0.9 MG/100ML-% IV SOLN
INTRAVENOUS | Status: AC
  Filled 2020-01-24: qty 100

## 2020-01-24 MED ORDER — CEFAZOLIN SODIUM-DEXTROSE 2-3 GM-%(50ML) IV SOLR
INTRAVENOUS | Status: DC | PRN
  Administered 2020-01-24: 2 g via INTRAVENOUS

## 2020-01-24 MED ORDER — SODIUM CHLORIDE 0.9 % IV SOLN: INTRAVENOUS | Status: DC

## 2020-01-24 MED ORDER — CLOPIDOGREL BISULFATE 300 MG PO TABS
ORAL_TABLET | ORAL | Status: AC
  Filled 2020-01-24: qty 1

## 2020-01-24 MED ORDER — TICAGRELOR 90 MG PO TABS
ORAL_TABLET | ORAL | Status: AC
  Filled 2020-01-24: qty 2

## 2020-01-24 MED ORDER — ACETAMINOPHEN 325 MG PO TABS
650.0000 mg | ORAL_TABLET | ORAL | Status: DC | PRN
  Filled 2020-01-24 (×4): qty 2

## 2020-01-24 MED ORDER — PHENYLEPHRINE HCL (PRESSORS) 10 MG/ML IV SOLN
INTRAVENOUS | Status: DC | PRN
  Administered 2020-01-24 (×2): 80 ug via INTRAVENOUS
  Administered 2020-01-24: 40 ug via INTRAVENOUS

## 2020-01-24 MED ORDER — EPHEDRINE SULFATE 50 MG/ML IJ SOLN
INTRAMUSCULAR | Status: DC | PRN
  Administered 2020-01-24 (×3): 10 mg via INTRAVENOUS

## 2020-01-24 MED ORDER — NITROGLYCERIN 1 MG/10 ML FOR IR/CATH LAB
INTRA_ARTERIAL | Status: AC | PRN
  Administered 2020-01-24 (×3): 25 ug via INTRA_ARTERIAL

## 2020-01-24 NOTE — Anesthesia Procedure Notes (Signed)
Procedure Name: Intubation Date/Time: 01/24/2020 9:42 PM Performed by: Purvis Kilts, CRNA Pre-anesthesia Checklist: Patient identified, Suction available, Patient being monitored and Timeout performed Patient Re-evaluated:Patient Re-evaluated prior to induction Oxygen Delivery Method: Circle system utilized Preoxygenation: Pre-oxygenation with 100% oxygen Induction Type: IV induction Ventilation: Mask ventilation without difficulty Laryngoscope Size: Mac and 4 Grade View: Grade I Tube type: Oral Tube size: 8.0 mm Number of attempts: 1 Airway Equipment and Method: Stylet Placement Confirmation: ETT inserted through vocal cords under direct vision,  positive ETCO2 and breath sounds checked- equal and bilateral Secured at: 22 cm Tube secured with: Tape Dental Injury: Teeth and Oropharynx as per pre-operative assessment

## 2020-01-24 NOTE — Sedation Documentation (Signed)
Spoke with [place], [place], 4N Charge. Pt to be admitted to 4N20. Cheryl with pt transport to bring bed to IR2 with monitor and O2 tank.

## 2020-01-24 NOTE — Progress Notes (Addendum)
PHARMACIST CODE STROKE RESPONSE  Notified to mix tPA at 2018 by Dr. Cheral Marker Delivered tPA to RN at 2022  tPA dose = 7.3 mg bolus over 1 minute followed by 66.1 mg for a total dose of 73.4 mg over 1 hour  Issues/delays encountered (if applicable): Patient's blood pressure was >185/110 on arrival requiring two doses labetalol 10 mg IV to be eligible for tPA  ADDENDUM: There were also no stroke beds available in the ED for this patient.   Ruben Chavez, PharmD PGY1 Pharmacy Resident

## 2020-01-24 NOTE — Sedation Documentation (Signed)
Spoke with [place] in pt placement. No admission order. There is a clean an ready bed available on 4N. Dr. Cheral Marker messaged to place order ASAP.

## 2020-01-24 NOTE — ED Provider Notes (Signed)
[facility] EMERGENCY DEPARTMENT Provider Note   CSN: [id number] Arrival date & time: 01/24/20  2003  An emergency department physician performed an initial assessment on this suspected stroke patient at 2004.  History Chief Complaint  Patient presents with  . Code Stroke    Ruben Chavez is a 69 y.o. male with past medical history significant for hypertension, hyperlipidemia, prostate cancer presenting to emergency department today via EMS with left-sided weakness, slurred speech and left-sided facial droop.  Code stroke was called and patient was evaluated by neurology on arrival. Patient denies any history of CVA or recent surgery.  He is not currently anticoagulated.  He states he was in his den tonight when he had sudden onset of weakness and fell to the ground.  He denies loss of consciousness.  He denies headache, visual changes, neck pain, chest pain, shortness of breath.  No medications prior to arrival.   Past Medical History:  Diagnosis Date  . Elevated PSA   . Hyperlipidemia    pt says not anymore  . Hypertension   . Prostate cancer [facility]) 2013    Patient Active Problem List   Diagnosis Date Noted  . Hyperlipidemia 02/21/2019  . Impingement syndrome of right shoulder region 02/21/2018  . OA (osteoarthritis) of hip 01/18/2018  . Prostate cancer ([place]) 03/04/2016  . Essential hypertension 11/27/2014  . History of colonic polyps 09/26/2014  . BACK PAIN, LEFT 12/22/2009  . PROSTATE SPECIFIC ANTIGEN, ELEVATED 12/20/2008  . LATERAL EPICONDYLITIS 07/15/2008  . ACTINIC KERATOSIS, FOREHEAD, LEFT 05/23/2008  . WART, LEFT HAND 07/19/2007  . ERECTILE DYSFUNCTION, MILD 07/19/2007  . HERNIA, BILATERAL INGUINAL W/O OBST/GANGRENE 07/19/2007  . MENISCUS TEAR 07/19/2007  . Bilateral inguinal hernia 07/19/2007    Past Surgical History:  Procedure Laterality Date  . bilateral inguinal hernia  2008  . CYST REMOVAL TRUNK  2016   sebaceous cyst  .  CYSTOSCOPY  04/22/2016   Procedure: CYSTOSCOPY;  Surgeon: [name], MD;  Location: [facility];  Service: Urology;;  . KNEE ARTHROSCOPY  2007   right  . PROSTATE BIOPSY  2008  . PROSTATE BIOPSY  2013  . PROSTATE BIOPSY  01/28/2016  . RADIOACTIVE SEED IMPLANT N/A 04/22/2016   Procedure: RADIOACTIVE SEED IMPLANT/BRACHYTHERAPY IMPLANT;  Surgeon: [name], MD;  Location[facility];  Service: Urology;  Laterality: N/A;  . TONSILLECTOMY    . TOTAL HIP ARTHROPLASTY  2001   left  . TOTAL HIP ARTHROPLASTY Right 01/18/2018   Procedure: RIGHT TOTAL HIP ARTHROPLASTY ANTERIOR APPROACH;  Surgeon: [name], MD;  Location: [facility];  Service: Orthopedics;  Laterality: Right;       Family History  Problem Relation Age of Onset  . Pancreatic cancer Mother   . Heart disease Father   . Colon cancer Neg Hx     Social History   Tobacco Use  . Smoking status: Former Smoker    Packs/day: 1.00    Years: 32.00    Pack years: 32.00    Types: Cigarettes    Quit date: 06/06/1999    Years since quitting: 20.6  . Smokeless tobacco: Never Used  Substance Use Topics  . Alcohol use: Yes    Alcohol/week: 14.0 standard drinks    Types: 14 Shots of liquor per week  . Drug use: No    Home Medications Prior to Admission medications   Medication Sig Start Date End Date Taking? Authorizing Provider  cephALEXin (KEFLEX) 500 MG capsule Take 2,000  mg by mouth See admin instructions. Take 2,000 mg by mouth one hour prior to dental appointment   Yes [provider]  HYDROcodone-acetaminophen (NORCO/VICODIN) 5-325 MG tablet Take 1 tablet by mouth every 6 (six) hours as needed for moderate pain. 11/16/19  Yes Isaac Bliss, Rayford Halsted, MD  lisinopril-hydrochlorothiazide (ZESTORETIC) 20-25 MG tablet Take 1 tablet by mouth daily. 09/19/19  Yes Isaac Bliss, Rayford Halsted, MD  methocarbamol (ROBAXIN) 500 MG tablet Take 1 tablet (500 mg total) by mouth every 8  (eight) hours as needed for muscle spasms. 11/12/19  Yes Billie Ruddy, MD    Allergies    Advil [ibuprofen], Aleve [naproxen], Sulfa antibiotics, Betadine [povidone iodine], Chloroxylenol (antiseptic), Poison ivy extract, and Povidone-iodine  Review of Systems   Review of Systems All other systems are reviewed and are negative for acute change except as noted in the HPI.  Physical Exam Updated Vital Signs BP (!) 164/82   Pulse 71   Temp 98.5 F (36.9 C) (Oral)   Resp 12   Wt 81.6 kg   SpO2 96%   BMI 26.58 kg/m   Physical Exam Vitals and nursing note reviewed.  Constitutional:      General: He is not in acute distress.    Appearance: He is not ill-appearing.  HENT:     Head: Normocephalic and atraumatic.     Right Ear: Tympanic membrane and external ear normal.     Left Ear: Tympanic membrane and external ear normal.     Nose: Nose normal.     Mouth/Throat:     Mouth: Mucous membranes are moist.     Pharynx: Oropharynx is clear.  Eyes:     General: No scleral icterus.       Right eye: No discharge.        Left eye: No discharge.     Extraocular Movements: Extraocular movements intact.     Conjunctiva/sclera: Conjunctivae normal.     Pupils: Pupils are equal, round, and reactive to light.  Neck:     Vascular: No JVD.  Cardiovascular:     Rate and Rhythm: Normal rate and regular rhythm.     Pulses: Normal pulses.          Radial pulses are 2+ on the right side and 2+ on the left side.     Heart sounds: Normal heart sounds.  Pulmonary:     Comments: Lungs clear to auscultation in all fields. Symmetric chest rise. No wheezing, rales, or rhonchi. Abdominal:     Comments: Abdomen is soft, non-distended, and non-tender in all quadrants. No rigidity, no guarding. No peritoneal signs.  Musculoskeletal:        General: Normal range of motion.     Cervical back: Normal range of motion.  Skin:    General: Skin is warm and dry.     Capillary Refill: Capillary refill  takes less than 2 seconds.  Neurological:     Mental Status: He is oriented to person, place, and time.     GCS: GCS eye subscore is 4. GCS verbal subscore is 5. GCS motor subscore is 6.     Comments: Patient able to follow commands. He is alert and oriented x3.  He has left facial droop, left hemiplegia and dysarthria. Left visual field cut.  Full ROM of right upper and lower extremity.  Psychiatric:        Behavior: Behavior normal.     ED Results / Procedures / Treatments   Labs (all  labs ordered are listed, but only abnormal results are displayed) Labs Reviewed  COMPREHENSIVE METABOLIC PANEL - Abnormal; Notable for the following components:      Result Value   CO2 20 (*)    Glucose, Bld 103 (*)    AST 50 (*)    ALT 55 (*)    All other components within normal limits  I-STAT CHEM 8, ED - Abnormal; Notable for the following components:   Potassium 3.4 (*)    Calcium, Ion 1.12 (*)    All other components within normal limits  RESPIRATORY PANEL BY RT PCR (FLU A&B, COVID)  ETHANOL  PROTIME-INR  APTT  CBC  DIFFERENTIAL  RAPID URINE DRUG SCREEN, HOSP PERFORMED  URINALYSIS, ROUTINE W REFLEX MICROSCOPIC  CBG MONITORING, ED    EKG EKG Interpretation  Date/Time:  Thursday January 24 2020 20:35:38 EST Ventricular Rate:  74 PR Interval:    QRS Duration: 104 QT Interval:  406 QTC Calculation: 451 R Axis:   12 Text Interpretation: Sinus rhythm Abnormal R-wave progression, early transition Minimal ST elevation, inferior leads No STEMI Confirmed by Octaviano Glow [phone]) on 01/24/2020 10:48:44 PM   Radiology CT Code Stroke CTA Head W/WO contrast  Result Date: 01/24/2020 CLINICAL DATA:  Left-sided weakness.  Neglect. EXAM: CT ANGIOGRAPHY HEAD AND NECK TECHNIQUE: Multidetector CT imaging of the head and neck was performed using the standard protocol during bolus administration of intravenous contrast. Multiplanar CT image reconstructions and MIPs were obtained to evaluate the  vascular anatomy. Carotid stenosis measurements (when applicable) are obtained utilizing NASCET criteria, using the distal internal carotid diameter as the denominator. CONTRAST:  54mL OMNIPAQUE IOHEXOL 350 MG/ML SOLN COMPARISON:  Head CT earlier same day FINDINGS: CTA NECK FINDINGS Aortic arch: Aortic atherosclerotic calcification. No aneurysm or dissection. Right carotid system: Right common carotid artery widely patent to the bifurcation. Calcified plaque at the carotid bifurcation and ICA bulb but no stenosis. Cervical ICA mildly irregular but patent to the skull base. Left carotid system: Common carotid artery widely patent to the bifurcation. Extensive calcified plaque at the carotid bifurcation and ICA bulb but no stenosis. Cervical ICA widely patent to the skull base. Vertebral arteries: Calcified plaque at the right vertebral artery origin with 30% stenosis. Left vertebral artery origin widely patent. Both vertebral arteries widely patent beyond that through the cervical region to the foramen magnum. Skeleton: Ordinary spondylosis. Other neck: No mass or lymphadenopathy. Upper chest: Normal Review of the MIP images confirms the above findings CTA HEAD FINDINGS Anterior circulation: Both internal carotid arteries are patent through the skull base and siphon regions. There is atherosclerotic calcification in the carotid siphon regions but no stenosis greater than 30%. On the left, the anterior and middle cerebral vessels appear normal. On the right, there is embolic occlusion of the superior segment M2 branch. Posterior circulation: Both vertebral arteries are patent through the foramen magnum to the basilar. There is atherosclerotic disease in both V4 segments with stenoses estimated at 30-50%. No basilar stenosis. Posterior circulation branch vessels are patent. Venous sinuses: Patent and normal. Anatomic variants: None significant. Review of the MIP images confirms the above findings IMPRESSION: Embolic  occlusion of the superior division M2 branch on the right. Atherosclerotic disease at both carotid bifurcation regions but no sign of flow limiting stenosis. On the initial CT, I probably underestimated the aspects score. I think there are some early changes higher than the temporal lobe, aspects probably 8 rather than 9. Case discussed with Dr. Cheral Marker during interpretation. Electronically  Signed   By: Nelson Chimes M.D.   On: 01/24/2020 20:44   CT Code Stroke CTA Neck W/WO contrast  Result Date: 01/24/2020 CLINICAL DATA:  Left-sided weakness.  Neglect. EXAM: CT ANGIOGRAPHY HEAD AND NECK TECHNIQUE: Multidetector CT imaging of the head and neck was performed using the standard protocol during bolus administration of intravenous contrast. Multiplanar CT image reconstructions and MIPs were obtained to evaluate the vascular anatomy. Carotid stenosis measurements (when applicable) are obtained utilizing NASCET criteria, using the distal internal carotid diameter as the denominator. CONTRAST:  35mL OMNIPAQUE IOHEXOL 350 MG/ML SOLN COMPARISON:  Head CT earlier same day FINDINGS: CTA NECK FINDINGS Aortic arch: Aortic atherosclerotic calcification. No aneurysm or dissection. Right carotid system: Right common carotid artery widely patent to the bifurcation. Calcified plaque at the carotid bifurcation and ICA bulb but no stenosis. Cervical ICA mildly irregular but patent to the skull base. Left carotid system: Common carotid artery widely patent to the bifurcation. Extensive calcified plaque at the carotid bifurcation and ICA bulb but no stenosis. Cervical ICA widely patent to the skull base. Vertebral arteries: Calcified plaque at the right vertebral artery origin with 30% stenosis. Left vertebral artery origin widely patent. Both vertebral arteries widely patent beyond that through the cervical region to the foramen magnum. Skeleton: Ordinary spondylosis. Other neck: No mass or lymphadenopathy. Upper chest: Normal  Review of the MIP images confirms the above findings CTA HEAD FINDINGS Anterior circulation: Both internal carotid arteries are patent through the skull base and siphon regions. There is atherosclerotic calcification in the carotid siphon regions but no stenosis greater than 30%. On the left, the anterior and middle cerebral vessels appear normal. On the right, there is embolic occlusion of the superior segment M2 branch. Posterior circulation: Both vertebral arteries are patent through the foramen magnum to the basilar. There is atherosclerotic disease in both V4 segments with stenoses estimated at 30-50%. No basilar stenosis. Posterior circulation branch vessels are patent. Venous sinuses: Patent and normal. Anatomic variants: None significant. Review of the MIP images confirms the above findings IMPRESSION: Embolic occlusion of the superior division M2 branch on the right. Atherosclerotic disease at both carotid bifurcation regions but no sign of flow limiting stenosis. On the initial CT, I probably underestimated the aspects score. I think there are some early changes higher than the temporal lobe, aspects probably 8 rather than 9. Case discussed with Dr. Cheral Marker during interpretation. Electronically Signed   By: Nelson Chimes M.D.   On: 01/24/2020 20:44   CT CEREBRAL PERFUSION W CONTRAST  Result Date: 01/24/2020 CLINICAL DATA:  Left-sided weakness and neglect. EXAM: CT PERFUSION BRAIN TECHNIQUE: Multiphase CT imaging of the brain was performed following IV bolus contrast injection. Subsequent parametric perfusion maps were calculated using RAPID software. CONTRAST:  45mL OMNIPAQUE IOHEXOL 350 MG/ML SOLN COMPARISON:  CT head and CT angiography earlier same day. FINDINGS: CT Brain Perfusion Findings: CBF (<30%) Volume: 71mL Perfusion (Tmax>6.0s) volume: 176mL Mismatch Volume: 84mL ASPECTS on noncontrast CT Head: 8 at 2030 hours today. Infarct Core: 46 mL demonstrated in the frontoparietal brain. Completed  infarction in the right temporal lobe not shown because of luxury perfusion. Infarction Location:Completed infarction in the right temporal lobe with luxury perfusion. New or completed infarction in the right frontoparietal brain. Additional 72 cc of at risk brain in the right MCA territory. IMPRESSION: Early subacute infarction in the right temporal lobe with pseudo normalization. Acute infarction at the right frontoparietal junction, 46 cc volume. Additional 72 cc of at risk  brain in the right MCA territory. Results reported to Dr. Cheral Marker by text message at approximately 2055 hours. Electronically Signed   By: Nelson Chimes M.D.   On: 01/24/2020 21:03   CT HEAD CODE STROKE WO CONTRAST  Addendum Date: 01/24/2020   ADDENDUM REPORT: 01/24/2020 20:49 ADDENDUM: I think that the initial interpretation underestimated the disease. Whereas there is a subacute infarction in the temporal lobe, there are probably more acute changes with loss of gray-white differentiation above that in the frontoparietal region, making the aspects 8 rather than 9. Electronically Signed   By: Nelson Chimes M.D.   On: 01/24/2020 20:49   Result Date: 01/24/2020 CLINICAL DATA:  Code stroke.  Left-sided weakness. EXAM: CT HEAD WITHOUT CONTRAST TECHNIQUE: Contiguous axial images were obtained from the base of the skull through the vertex without intravenous contrast. COMPARISON:  None. FINDINGS: Brain: Subacute infarction in the right lateral temporal lobe. Mild swelling but no hemorrhage. No other acute finding. Elsewhere, there are mild chronic small-vessel changes of the white matter. No mass, hydrocephalus or extra-axial collection. Vascular: There is atherosclerotic calcification of the major vessels at the base of the brain. Skull: Negative Sinuses/Orbits: Clear/normal Other: None ASPECTS ([place] Stroke Program Early CT Score) - Ganglionic level infarction (caudate, lentiform nuclei, internal capsule, insula, M1-M3 cortex): 6 -  Supraganglionic infarction (M4-M6 cortex): 3 Total score (0-10 with 10 being normal): 9 IMPRESSION: 1. Subacute infarction in the right lateral temporal lobe. No hemorrhage or mass effect. 2. ASPECTS is 9 3. These results were communicated to Dr. Cheral Marker at [place] 2/18/2021by text page via the [facility] messaging system. Electronically Signed: By: Nelson Chimes M.D. On: 01/24/2020 20:22    Procedures .Critical Care Performed by: Cherre Robins, PA-C Authorized by: Cherre Robins, PA-C   Critical care provider statement:    Critical care time (minutes):  40   Critical care time was exclusive of:  Separately billable procedures and treating other patients and teaching time   Critical care was time spent personally by me on the following activities:  Blood draw for specimens, development of treatment plan with patient or surrogate, discussions with consultants, evaluation of patient's response to treatment, examination of patient, ordering and performing treatments and interventions, ordering and review of laboratory studies, ordering and review of radiographic studies, pulse oximetry, re-evaluation of patient's condition, review of old charts and obtaining history from patient or surrogate   I assumed direction of critical care for this patient from another provider in my specialty: no     (including critical care time)  Medications Ordered in ED Medications  tirofiban (AGGRASTAT) 5-0.9 MG/100ML-% injection (has no administration in time range)  aspirin 81 MG chewable tablet (has no administration in time range)  verapamil (ISOPTIN) 2.5 MG/ML injection (has no administration in time range)  ticagrelor (BRILINTA) 90 MG tablet (has no administration in time range)  clopidogrel (PLAVIX) 300 MG tablet (has no administration in time range)  eptifibatide (INTEGRILIN) 20 MG/10ML injection (has no administration in time range)  ceFAZolin (ANCEF) 2-4 GM/100ML-% IVPB (has no administration in  time range)  nitroGLYCERIN 100 mcg/mL intra-arterial injection (has no administration in time range)  alteplase (ACTIVASE) 1 mg/mL infusion 73.4 mg (0 mg/kg  81.6 kg Intravenous Stopped 01/24/20 2142)    Followed by  0.9 %  sodium chloride infusion (50 mLs Intravenous New Bag/Given 01/24/20 2140)  iohexol (OMNIPAQUE) 350 MG/ML injection 75 mL (75 mLs Intravenous Contrast Given 01/24/20 2024)  iohexol (OMNIPAQUE) 350 MG/ML injection 40  mL (40 mLs Intravenous Contrast Given 01/24/20 2047)    ED Course  I have reviewed the triage vital signs and the nursing notes.  Pertinent labs & imaging results that were available during my care of the patient were reviewed by me and considered in my medical decision making (see chart for details).    MDM Rules/Calculators/A&P                      Last seen normal 7 PM tonight.  Code stroke activated and patient was evaluated by neurology at the bridge.  He immediately went over to scanner. CT head with an early subacute ischemic infarction in the right temporal lobe.  CTA head and neck reveals acute right M2 superior division occlusion. Patient consented by neurology and given TPA.  Labs reviewed.  Overall unremarkable including CBC, CMP, INR, PTT, ethanol, Covid PCR is negative. EKG without stemi.  On reassessment with Dr. Cheral Marker at the bedside.  Patient is still having left-sided weakness.  He is unable to raise his left arm and leg.  Has decreased sensation in left arm and leg. I discussed with patient's wife on speaker phone the risks and benefits of VIR removal with IR.  She discussed with patient and they are agreeable to the procedure.  Patient is able to give consent. Patient stable at time of disposition.     Portions of this note were generated with [employer]. Dictation errors may occur despite best attempts at proofreading.   Final Clinical Impression(s) / ED Diagnoses Final diagnoses:  Stroke [facility])    Rx / DC Orders ED  Discharge Orders    None       Cherre Robins, PA-C 01/24/20 2250    Wyvonnia Dusky, MD 01/25/20 [phone]

## 2020-01-24 NOTE — ED Notes (Signed)
Pt arrival to IR. Pt groin shaved, consent obtained. Family notified.

## 2020-01-24 NOTE — ED Notes (Addendum)
10 mg IV labetalol given.

## 2020-01-24 NOTE — Anesthesia Preprocedure Evaluation (Signed)
Anesthesia Evaluation  Patient identified by MRN, date of birth, ID band Patient awake    Reviewed: Allergy & Precautions, NPO status , Patient's Chart, lab work & pertinent test results  Airway Mallampati: II  TM Distance: >3 FB Neck ROM: Full    Dental no notable dental hx.    Pulmonary neg pulmonary ROS, former smoker,    Pulmonary exam normal breath sounds clear to auscultation       Cardiovascular hypertension, Pt. on medications Normal cardiovascular exam Rhythm:Regular Rate:Normal     Neuro/Psych negative psych ROS   GI/Hepatic negative GI ROS, Neg liver ROS,   Endo/Other  negative endocrine ROS  Renal/GU negative Renal ROS  negative genitourinary   Musculoskeletal negative musculoskeletal ROS (+)   Abdominal   Peds negative pediatric ROS (+)  Hematology negative hematology ROS (+)   Anesthesia Other Findings   Reproductive/Obstetrics negative OB ROS                             Anesthesia Physical Anesthesia Plan  ASA: III and emergent  Anesthesia Plan: General   Post-op Pain Management:    Induction: Intravenous and Rapid sequence  PONV Risk Score and Plan: 2 and Ondansetron, Dexamethasone and Treatment may vary due to age or medical condition  Airway Management Planned: Oral ETT  Additional Equipment:   Intra-op Plan:   Post-operative Plan: Possible Post-op intubation/ventilation  Informed Consent: I have reviewed the patients History and Physical, chart, labs and discussed the procedure including the risks, benefits and alternatives for the proposed anesthesia with the patient or authorized representative who has indicated his/her understanding and acceptance.     Dental advisory given  Plan Discussed with: CRNA and Surgeon  Anesthesia Plan Comments:         Anesthesia Quick Evaluation

## 2020-01-24 NOTE — Consult Note (Signed)
INR. 69 Y RT H M MRS 0  New onset Lt sided weakness and dysarthria. LSW 700 pm LN. Ct brain NO ICH AS{PECTS 8. CTA occluded LT MCA inf division. CTP core of 46 ml v mismatch of 72 ml ,and ratio of 2.t ml. Patient fully conversant and appropriate. Endo vascular treatment D/W him. Reasons,procedure and alternatives reviewed. Risks of ICH of 10 %,worsening neuro function ,death and inability to revascularize were discussed.  Patient expressed understanding and gave consent to proceed. S.Muath Hallam MD

## 2020-01-24 NOTE — H&P (Addendum)
NEUROLOGY HISTORY AND PHYSICAL  Referring Physician: Dr. Langston Masker    Chief Complaint: Acute onset of left sided weakness  HPI: Ruben Chavez is an 69 y.o. male presenting acutely to the [facility] ED after acute onset of left sided weakness at home. He was sitting on a couch without symptoms when he suddenly slid and fell off the couch at 1900. Wife noted that he was weak on the left side, so she called EMS. On arrival, EMS noted left facial droop, left hemiplegia and dysarthria. Code Stroke was called in the field. BP was 180/120 per EMS. CBG 113.   On arrival to the ED, the patient was hemiplegic on the left, with dense left sided sensory loss, left facial droop, dysarthria, left visual field cut and anosognosia. He was fully conversant and able to understand all questions and commands.   He is not on a blood thinner. He has no prior history of stroke, ICH, intracranial aneurysm, brain tumor or MI. No recent major surgeries.   LSN: 1900 tPA Given: Yes NIHSS: 18  Past Medical History:  Diagnosis Date  . Elevated PSA   . Hyperlipidemia    pt says not anymore  . Hypertension   . Prostate cancer ([place]) 2013    Past Surgical History:  Procedure Laterality Date  . bilateral inguinal hernia  2008  . CYST REMOVAL TRUNK  2016   sebaceous cyst  . CYSTOSCOPY  04/22/2016   Procedure: CYSTOSCOPY;  Surgeon: [name], MD;  Location: [facility];  Service: Urology;;  . KNEE ARTHROSCOPY  2007   right  . PROSTATE BIOPSY  2008  . PROSTATE BIOPSY  2013  . PROSTATE BIOPSY  01/28/2016  . RADIOACTIVE SEED IMPLANT N/A 04/22/2016   Procedure: RADIOACTIVE SEED IMPLANT/BRACHYTHERAPY IMPLANT;  Surgeon: [name], MD;  Location: [facility];  Service: Urology;  Laterality: N/A;  . TONSILLECTOMY    . TOTAL HIP ARTHROPLASTY  2001   left  . TOTAL HIP ARTHROPLASTY Right 01/18/2018   Procedure: RIGHT TOTAL HIP ARTHROPLASTY ANTERIOR APPROACH;  Surgeon: [name],  MD;  Location: [facility];  Service: Orthopedics;  Laterality: Right;    Family History  Problem Relation Age of Onset  . Pancreatic cancer Mother   . Heart disease Father   . Colon cancer Neg Hx    Social History:  reports that he quit smoking about 20 years ago. His smoking use included cigarettes. He has a 32.00 pack-year smoking history. He has never used smokeless tobacco. He reports current alcohol use of about 14.0 standard drinks of alcohol per week. He reports that he does not use drugs.  Allergies:  Allergies  Allergen Reactions  . Sulfa Antibiotics Other (See Comments)    Swelling in ankles   . Betadine [Povidone Iodine] Rash  . Chloroxylenol (Antiseptic) Itching and Rash    PCMX surgical sterilizing scrub  . Poison Ivy Extract Rash  . Povidone-Iodine Itching and Rash    BETADINE    Home Medications:  No current facility-administered medications on file prior to encounter.   Current Outpatient Medications on File Prior to Encounter  Medication Sig Dispense Refill  . HYDROcodone-acetaminophen (NORCO/VICODIN) 5-325 MG tablet Take 1 tablet by mouth every 6 (six) hours as needed for moderate pain. 30 tablet 0  . lisinopril-hydrochlorothiazide (ZESTORETIC) 20-25 MG tablet Take 1 tablet by mouth daily. 90 tablet 1  . methocarbamol (ROBAXIN) 500 MG tablet Take 1 tablet (500 mg total) by mouth every 8 (eight) hours as  needed for muscle spasms. 30 tablet 0     ROS: Denies headache. Has some right posterior neck pain. The patient has left sided neglect and anosognosia. In this context remainder of the comprehensive ROS is negative.   Physical Examination: BP (!) 153/83   Pulse 75   Resp 16   Wt 81.6 kg   SpO2 99%   BMI 26.58 kg/m   HEENT: Normocephalic. C-collar in place.  Lungs: Respirations unlabored Ext: No edema  Neurologic Examination: Mental Status:  Alert, oriented, thought content appropriate.  Speech fluent without evidence of aphasia, but with prominent  dysarthria.  Able to follow all commands without difficulty. Left hemineglect with anosognosia.  Cranial Nerves: II:  Left visual field cut. PERRL.  III,IV, VI: No ptosis. Horizontal EOM to right normal, has hesitancy with tracking to left, can cross midline but cannot fully gaze to left. No nystagmus.  V,VII: Left facial droop. Insensate to temp and FT on left. VIII: Hearing intact to voice IX,X: No hypophonia XI: Weak on the left.  XII: Tongue deviates to left Motor: RUE and RLE 5/5 LUE 0/5 volitional, with reflexive weak internal rotation and extension to deep pressure LLE 0/5 volitional, with reflexive weak internal rotation and extension to deep pressure Sensory: Insensate to FT, temp and deep pressure, LUE and LLE Deep Tendon Reflexes:  2+ right biceps and brachioradialis. 1+ left brachioradialis and biceps. 3+ bilateral patellae.  Plantars: Right: downgoing   Left: upgoing Cerebellar: No ataxia with FNF on the right. Unable to perform on the left.  Gait: Unable to assess   Results for orders placed or performed during the hospital encounter of 01/24/20 (from the past 48 hour(s))  CBG monitoring, ED     Status: None   Collection Time: 01/24/20  8:05 PM  Result Value Ref Range   Glucose-Capillary 89 70 - 99 mg/dL   Comment 1 Notify RN    Comment 2 Document in Chart     CT head:  Subacute infarction in the right lateral temporal lobe. No hemorrhage or mass effect. ASPECTS is 9  CTA head and neck: Embolic occlusion of the superior division M2 branch on the right. Atherosclerotic disease at both carotid bifurcation regions but no sign of flow limiting stenosis. On the initial CT, I probably underestimated the aspects score. I think there are some early changes higher than the temporal lobe, aspects probably 8 rather than 9.  CTP: Early subacute infarction in the right temporal lobe with pseudo-normalization. Acute infarction at the right frontoparietal junction, 46 cc volume.  Additional 72 cc of at risk brain in the right MCA territory.  Assessment: 69 y.o. male presenting with acute onset of left hemiplegia, dense left sided sensory loss, left facial droop, dysarthria, left visual field cut and anosognosia.  1. CT reveals an early subacute ischemic infarction in the right temporal lobe. ASPECTS is 8. 2. CTA head and neck reveals acute right M2 superior division occlusion 3. NIHSS 18 4. Stroke Risk Factors - History of cancer, HLD and HTN 5. 14 drink-per-week EtOH usage.   Plan: 1. After comprehensive review of possible contraindications, he has no absolute contraindications to tPA administration. Patient is a tPA candidate. Discussed extensively the risks/benefits of tPA treatment vs. no treatment with the patient, including risks of hemorrhage and death with tPA administration versus worse overall outcomes on average in patients within tPA time window who are not administered tPA. Overall benefits of tPA regarding long-term prognosis are felt to outweigh risks. The  patient expressed understanding and wish to proceed with tPA.  2. The patient is a candidate for VIR. Full risks/benefits discussed, including approximately 50% chance of significant improvement with an approximately 10% risk of subarachnoid hemorrhage. Consent form signed.  3. Admitting to Neuro ICU after VIR. Post-tPA and VIR orders to include frequent neuro checks and BP management. No antiplatelet medications or anticoagulants for at least 24 hours following tPA.  4. DVT prophylaxis with SCDs.  5. Will need to be started on a statin.  6. Will need to be started on antiplatelet therapy if follow up CT at 24 hours is negative for hemorrhagic conversion. 7. TTE.  8. MRI brain  9. Telemetry monitoring. 10. PT/OT/Speech.  11. NPO until passes swallow evaluation.  12. Fasting lipid panel, HgbA1c 13. CIWA protocol.   104 minutes spent in the emergent neurological evaluation and management of this  critically ill patient  @Electronically  signed: Dr. Kerney Elbe  01/24/2020, 8:09 PM

## 2020-01-24 NOTE — ED Triage Notes (Signed)
Pt bib gcems from home for left sided weakness, slurred speech, left sided facial droop. Code stroke called. Pt AOx4 on arrival. EMS VSS.

## 2020-01-24 NOTE — Procedures (Addendum)
S/P RT common carotid arteriogram followed by revascularization of occluded inf division of Rt MCA with x2 passes with 93mm x 40 mm solitaireX retriever and x 1 pass with 76mm x 32 mm Trevoprovue retriever and penumbra aspiration achieving a TICI 2b revascularization. S.Amani Nodarse MD. Post treatmen tCT brain reveals contrast stain in the temporal parietal cortical subcortical region and a small focal  Overlying hyperintensity ? Contrast with SAH. No mass effect.  RT groin sheath left in place in view of patient having received TPA. Distal pulses  right DP and PT dopplerable .  Lt DP and PT palpable ,unchanged. S.Lew Prout MD

## 2020-01-24 NOTE — ED Provider Notes (Signed)
Patient presented into the ED while I was at the bedside with another critically ill patient, and I was therefore not able to personally assess the patient.  However he was seen by my PA provider as well as immediately by the neurologist, and taken for IR intervention prior to my being able to evaluate him in the ED.   Wyvonnia Dusky, MD 01/24/20 2249

## 2020-01-25 ENCOUNTER — Inpatient Hospital Stay (HOSPITAL_COMMUNITY): Payer: PPO

## 2020-01-25 DIAGNOSIS — J9601 Acute respiratory failure with hypoxia: Secondary | ICD-10-CM | POA: Diagnosis not present

## 2020-01-25 DIAGNOSIS — I639 Cerebral infarction, unspecified: Secondary | ICD-10-CM | POA: Diagnosis present

## 2020-01-25 DIAGNOSIS — I959 Hypotension, unspecified: Secondary | ICD-10-CM

## 2020-01-25 DIAGNOSIS — E876 Hypokalemia: Secondary | ICD-10-CM | POA: Diagnosis present

## 2020-01-25 DIAGNOSIS — I6389 Other cerebral infarction: Secondary | ICD-10-CM

## 2020-01-25 DIAGNOSIS — G9340 Encephalopathy, unspecified: Secondary | ICD-10-CM | POA: Diagnosis present

## 2020-01-25 HISTORY — PX: IR PERCUTANEOUS ART THROMBECTOMY/INFUSION INTRACRANIAL INC DIAG ANGIO: IMG6087

## 2020-01-25 HISTORY — PX: IR CT HEAD LTD: IMG2386

## 2020-01-25 HISTORY — PX: IR ANGIO VERTEBRAL SEL SUBCLAVIAN INNOMINATE UNI R MOD SED: IMG5365

## 2020-01-25 LAB — BASIC METABOLIC PANEL
Anion gap: 12 (ref 5–15)
BUN: 14 mg/dL (ref 8–23)
CO2: 20 mmol/L — ABNORMAL LOW (ref 22–32)
Calcium: 8.3 mg/dL — ABNORMAL LOW (ref 8.9–10.3)
Chloride: 103 mmol/L (ref 98–111)
Creatinine, Ser: 0.85 mg/dL (ref 0.61–1.24)
GFR calc Af Amer: >60 mL/min (ref >60)
GFR calc non Af Amer: >60 mL/min (ref >60)
Glucose, Bld: 173 mg/dL — ABNORMAL HIGH (ref 70–99)
Potassium: 3.8 mmol/L (ref 3.5–5.1)
Sodium: 135 mmol/L (ref 135–145)

## 2020-01-25 LAB — CBC WITH DIFFERENTIAL/PLATELET
Abs Immature Granulocytes: 0.04 10*3/uL (ref 0.00–0.07)
Basophils Absolute: 0 10*3/uL (ref 0.0–0.1)
Basophils Relative: 0 %
Eosinophils Absolute: 0 10*3/uL (ref 0.0–0.5)
Eosinophils Relative: 0 %
HCT: 40.3 % (ref 39.0–52.0)
Hemoglobin: 13.9 g/dL (ref 13.0–17.0)
Immature Granulocytes: 0 %
Lymphocytes Relative: 8 %
Lymphs Abs: 1.1 10*3/uL (ref 0.7–4.0)
MCH: 32.4 pg (ref 26.0–34.0)
MCHC: 34.5 g/dL (ref 30.0–36.0)
MCV: 93.9 fL (ref 80.0–100.0)
Monocytes Absolute: 0.8 10*3/uL (ref 0.1–1.0)
Monocytes Relative: 6 %
Neutro Abs: 11.2 10*3/uL — ABNORMAL HIGH (ref 1.7–7.7)
Neutrophils Relative %: 86 %
Platelets: 269 10*3/uL (ref 150–400)
RBC: 4.29 MIL/uL (ref 4.22–5.81)
RDW: 14.4 % (ref 11.5–15.5)
WBC: 13.3 10*3/uL — ABNORMAL HIGH (ref 4.0–10.5)
nRBC: 0 % (ref 0.0–0.2)

## 2020-01-25 LAB — POCT I-STAT 7, (LYTES, BLD GAS, ICA,H+H)
Acid-base deficit: 2 mmol/L (ref 0.0–2.0)
Bicarbonate: 22.3 mmol/L (ref 20.0–28.0)
Calcium, Ion: 1.31 mmol/L (ref 1.15–1.40)
HCT: 40 % (ref 39.0–52.0)
Hemoglobin: 13.6 g/dL (ref 13.0–17.0)
O2 Saturation: 100 %
Patient temperature: 98.7
Potassium: 3.7 mmol/L (ref 3.5–5.1)
Sodium: 137 mmol/L (ref 135–145)
TCO2: 23 mmol/L (ref 22–32)
pCO2 arterial: 35.1 mmHg (ref 32.0–48.0)
pH, Arterial: 7.41 (ref 7.350–7.450)
pO2, Arterial: 188 mmHg — ABNORMAL HIGH (ref 83.0–108.0)

## 2020-01-25 LAB — HEMOGLOBIN A1C
Hgb A1c MFr Bld: 5 % (ref 4.8–5.6)
Mean Plasma Glucose: 96.8 mg/dL

## 2020-01-25 LAB — TRIGLYCERIDES: Triglycerides: 129 mg/dL (ref <150)

## 2020-01-25 LAB — MRSA PCR SCREENING: MRSA by PCR: NEGATIVE

## 2020-01-25 LAB — ECHOCARDIOGRAM COMPLETE
Height: 69 in
Weight: 2880 oz

## 2020-01-25 LAB — LIPID PANEL
Cholesterol: 117 mg/dL (ref 0–200)
HDL: 36 mg/dL — ABNORMAL LOW (ref >40)
LDL Cholesterol: 54 mg/dL (ref 0–99)
Total CHOL/HDL Ratio: 3.3 RATIO
Triglycerides: 135 mg/dL (ref <150)
VLDL: 27 mg/dL (ref 0–40)

## 2020-01-25 LAB — HIV ANTIBODY (ROUTINE TESTING W REFLEX): HIV Screen 4th Generation wRfx: NONREACTIVE

## 2020-01-25 LAB — GLUCOSE, CAPILLARY: Glucose-Capillary: 128 mg/dL — ABNORMAL HIGH (ref 70–99)

## 2020-01-25 MED ORDER — FENTANYL 2500MCG IN NS 250ML (10MCG/ML) PREMIX INFUSION
0.0000 ug/h | INTRAVENOUS | Status: DC
  Administered 2020-01-25: 50 ug/h via INTRAVENOUS
  Administered 2020-01-25: 300 ug/h via INTRAVENOUS
  Filled 2020-01-25 (×2): qty 250

## 2020-01-25 MED ORDER — LISINOPRIL-HYDROCHLOROTHIAZIDE 20-25 MG PO TABS: 1.0000 | ORAL_TABLET | Freq: Every day | ORAL | Status: DC

## 2020-01-25 MED ORDER — STROKE: EARLY STAGES OF RECOVERY BOOK
Freq: Once | Status: AC
  Filled 2020-01-25: qty 1

## 2020-01-25 MED ORDER — IOHEXOL 300 MG/ML  SOLN
150.0000 mL | Freq: Once | INTRAMUSCULAR | Status: AC | PRN
  Administered 2020-01-25: 48 mL via INTRA_ARTERIAL

## 2020-01-25 MED ORDER — SODIUM CHLORIDE 0.9 % IV SOLN: INTRAVENOUS | Status: DC

## 2020-01-25 MED ORDER — CHLORHEXIDINE GLUCONATE 0.12% ORAL RINSE (MEDLINE KIT)
15.0000 mL | Freq: Two times a day (BID) | OROMUCOSAL | Status: DC
  Administered 2020-01-25 (×2): 15 mL via OROMUCOSAL

## 2020-01-25 MED ORDER — HYDROCHLOROTHIAZIDE 25 MG PO TABS: 25.0000 mg | ORAL_TABLET | Freq: Every day | ORAL | Status: DC

## 2020-01-25 MED ORDER — ACETAMINOPHEN 325 MG PO TABS
650.0000 mg | ORAL_TABLET | ORAL | Status: DC | PRN
  Administered 2020-01-26 – 2020-01-27 (×3): 650 mg via ORAL

## 2020-01-25 MED ORDER — CALCIUM GLUCONATE 10 % IV SOLN: 1.0000 g | Freq: Once | INTRAVENOUS | Status: DC

## 2020-01-25 MED ORDER — POTASSIUM CHLORIDE 20 MEQ/15ML (10%) PO SOLN: 40.0000 meq | Freq: Once | ORAL | Status: DC

## 2020-01-25 MED ORDER — MIDAZOLAM HCL 2 MG/2ML IJ SOLN
1.0000 mg | INTRAMUSCULAR | Status: DC | PRN
  Filled 2020-01-25: qty 2

## 2020-01-25 MED ORDER — LISINOPRIL 20 MG PO TABS: 20.0000 mg | ORAL_TABLET | Freq: Every day | ORAL | Status: DC

## 2020-01-25 MED ORDER — PANTOPRAZOLE SODIUM 40 MG IV SOLR
40.0000 mg | Freq: Every day | INTRAVENOUS | Status: DC
  Administered 2020-01-25: 22:00:00 40 mg via INTRAVENOUS
  Filled 2020-01-25: qty 40

## 2020-01-25 MED ORDER — CHLORHEXIDINE GLUCONATE CLOTH 2 % EX PADS
6.0000 | MEDICATED_PAD | Freq: Every day | CUTANEOUS | Status: DC
  Administered 2020-01-26 – 2020-01-27 (×2): 6 via TOPICAL

## 2020-01-25 MED ORDER — FOLIC ACID 5 MG/ML IJ SOLN
1.0000 mg | Freq: Every day | INTRAMUSCULAR | Status: DC
  Administered 2020-01-25: 10:00:00 1 mg via INTRAVENOUS
  Filled 2020-01-25 (×2): qty 0.2

## 2020-01-25 MED ORDER — NOREPINEPHRINE 4 MG/250ML-% IV SOLN
2.0000 ug/min | INTRAVENOUS | Status: DC
  Administered 2020-01-25: 10 ug/min via INTRAVENOUS
  Administered 2020-01-25: 2 ug/min via INTRAVENOUS
  Administered 2020-01-25: 10 ug/min via INTRAVENOUS
  Administered 2020-01-25: 2 ug/min via INTRAVENOUS
  Filled 2020-01-25 (×3): qty 250

## 2020-01-25 MED ORDER — SODIUM CHLORIDE 0.9 % IV SOLN: 250.0000 mL | INTRAVENOUS | Status: DC

## 2020-01-25 MED ORDER — LABETALOL HCL 5 MG/ML IV SOLN: 20.0000 mg | Freq: Once | INTRAVENOUS | Status: DC

## 2020-01-25 MED ORDER — ASPIRIN EC 81 MG PO TBEC
81.0000 mg | DELAYED_RELEASE_TABLET | Freq: Every day | ORAL | Status: DC
  Administered 2020-01-25 – 2020-01-27 (×3): 81 mg via ORAL
  Filled 2020-01-25 (×3): qty 1

## 2020-01-25 MED ORDER — NOREPINEPHRINE 4 MG/250ML-% IV SOLN
INTRAVENOUS | Status: AC
  Filled 2020-01-25: qty 250

## 2020-01-25 MED ORDER — CLEVIDIPINE BUTYRATE 0.5 MG/ML IV EMUL: 0.0000 mg/h | INTRAVENOUS | Status: DC

## 2020-01-25 MED ORDER — ORAL CARE MOUTH RINSE
15.0000 mL | OROMUCOSAL | Status: DC
  Administered 2020-01-25 (×7): 15 mL via OROMUCOSAL

## 2020-01-25 MED ORDER — PROPOFOL 500 MG/50ML IV EMUL
INTRAVENOUS | Status: DC | PRN
  Administered 2020-01-24: 50 ug/kg/min via INTRAVENOUS

## 2020-01-25 MED ORDER — PROPOFOL 1000 MG/100ML IV EMUL
5.0000 ug/kg/min | INTRAVENOUS | Status: DC
  Administered 2020-01-25: 30 ug/kg/min via INTRAVENOUS
  Administered 2020-01-25: 50 ug/kg/min via INTRAVENOUS
  Filled 2020-01-25: qty 100

## 2020-01-25 MED ORDER — SODIUM CHLORIDE 0.9 % IV SOLN
1.0000 g | Freq: Once | INTRAVENOUS | Status: AC
  Administered 2020-01-25: 1 g via INTRAVENOUS
  Filled 2020-01-25: qty 10

## 2020-01-25 MED ORDER — THIAMINE HCL 100 MG/ML IJ SOLN
100.0000 mg | Freq: Every day | INTRAMUSCULAR | Status: DC
  Administered 2020-01-25: 100 mg via INTRAVENOUS
  Filled 2020-01-25: qty 2

## 2020-01-25 MED ORDER — IOHEXOL 300 MG/ML  SOLN
50.0000 mL | Freq: Once | INTRAMUSCULAR | Status: AC | PRN
  Administered 2020-01-25: 40 mL via INTRA_ARTERIAL

## 2020-01-25 MED ORDER — ACETAMINOPHEN 650 MG RE SUPP: 650.0000 mg | RECTAL | Status: DC | PRN

## 2020-01-25 MED ORDER — SENNOSIDES-DOCUSATE SODIUM 8.6-50 MG PO TABS: 1.0000 | ORAL_TABLET | Freq: Every evening | ORAL | Status: DC | PRN

## 2020-01-25 MED ORDER — SODIUM CHLORIDE 0.9 % IV BOLUS
500.0000 mL | Freq: Once | INTRAVENOUS | Status: AC
  Administered 2020-01-25: 500 mL via INTRAVENOUS

## 2020-01-25 NOTE — Progress Notes (Addendum)
  Echocardiogram 2D Echocardiogram has been performed.  Strongly suggest waiting until tomorrow if further imaging is needed due to patient being extubated this evening.   Burke Terry A Tressie Ragin 01/25/2020, 3:21 PM

## 2020-01-25 NOTE — Transfer of Care (Signed)
Immediate Anesthesia Transfer of Care Note  Patient: [name]  Procedure(s) Performed: IR WITH ANESTHESIA (N/A )  Patient Location: ICU  Anesthesia Type:General  Level of Consciousness: Patient remains intubated per anesthesia plan  Airway & Oxygen Therapy: Patient remains intubated per anesthesia plan and Patient placed on Ventilator (see vital sign flow sheet for setting)  Post-op Assessment: Report given to RN and Post -op Vital signs reviewed and stable  Post vital signs: Reviewed and stable  Last Vitals:  Vitals Value Taken Time  BP 118/72   Temp    Pulse 75   Resp 16   SpO2 100     Last Pain:  Vitals:   01/24/20 2107  TempSrc: Oral  PainSc:      Report to Nic RN in 4N ICU. Full report given. RT at bedside, applied to Vent - PRVC, 40% FiO2, VSS. Patient to remain intubated per Deveshwar MD.     Complications: No apparent anesthesia complications

## 2020-01-25 NOTE — Progress Notes (Signed)
Staff is following BP via R fem sheath. Dr. Estanislado Pandy aware of discrepancy between the two. Post sheath removal, staff instructed to maintain NBP 100-140 by Dr. Estanislado Pandy d/t systolic difference between cuff and sheath.

## 2020-01-25 NOTE — Progress Notes (Signed)
Patient for 4N20 came up with no possessions from the IR procedure.

## 2020-01-25 NOTE — Progress Notes (Addendum)
Referring Physician(s): Code Stroke- Kerney Elbe  Supervising Physician: [name]  Patient Status:  [facility] - In-pt  Chief Complaint: None- intubated with sedation  Subjective:  History of acute CVA s/p cerebral arteriogram with emergent mechanical thrombectomy of right MCA inferior division achieving a TICI 2B revascularization 01/24/2020 by Dr. Estanislado Pandy. Patient laying in bed intubated with sedation. He responds to name and follows simple commands. Can spontaneously move right side and LLE, no spontaneous movements of LUE. Right groin incision c/d/i with sheath in place.   Allergies: Advil [ibuprofen], Aleve [naproxen], Sulfa antibiotics, Betadine [povidone iodine], Chloroxylenol (antiseptic), Poison ivy extract, and Povidone-iodine  Medications: Prior to Admission medications   Medication Sig Start Date End Date Taking? Authorizing Provider  cephALEXin (KEFLEX) 500 MG capsule Take 2,000 mg by mouth See admin instructions. Take 2,000 mg by mouth one hour prior to dental appointment   Yes [provider]  HYDROcodone-acetaminophen (NORCO/VICODIN) 5-325 MG tablet Take 1 tablet by mouth every 6 (six) hours as needed for moderate pain. 11/16/19  Yes Isaac Bliss, Rayford Halsted, MD  lisinopril-hydrochlorothiazide (ZESTORETIC) 20-25 MG tablet Take 1 tablet by mouth daily. 09/19/19  Yes Isaac Bliss, Rayford Halsted, MD  methocarbamol (ROBAXIN) 500 MG tablet Take 1 tablet (500 mg total) by mouth every 8 (eight) hours as needed for muscle spasms. 11/12/19  Yes Billie Ruddy, MD     Vital Signs: BP 100/82   Pulse (!) 55   Temp 97.8 F (36.6 C) (Oral)   Resp (!) 21   Ht 5\' 9"  (1.753 m)   Wt 180 lb (81.6 kg)   SpO2 100%   BMI 26.58 kg/m   Physical Exam Vitals and nursing note reviewed.  Constitutional:      General: He is not in acute distress.    Comments: Intubated with sedation.  Pulmonary:     Effort: Pulmonary effort is normal. No respiratory distress.       Comments: Intubated with sedation. Skin:    General: Skin is warm and dry.     Comments: Right groin incision soft with sheath intact, no active bleeding or hematoma.  Neurological:     Comments: Intubated with sedation. He opens eyes to voice and follows simple commands. PERRL bilaterally. Can spontaneously move right side and LLE, no spontaneous movements of LUE. Distal pulses 1+ on left and palpable with Doppler on right.     Imaging: CT Code Stroke CTA Head W/WO contrast  Result Date: 01/24/2020 CLINICAL DATA:  Left-sided weakness.  Neglect. EXAM: CT ANGIOGRAPHY HEAD AND NECK TECHNIQUE: Multidetector CT imaging of the head and neck was performed using the standard protocol during bolus administration of intravenous contrast. Multiplanar CT image reconstructions and MIPs were obtained to evaluate the vascular anatomy. Carotid stenosis measurements (when applicable) are obtained utilizing NASCET criteria, using the distal internal carotid diameter as the denominator. CONTRAST:  62mL OMNIPAQUE IOHEXOL 350 MG/ML SOLN COMPARISON:  Head CT earlier same day FINDINGS: CTA NECK FINDINGS Aortic arch: Aortic atherosclerotic calcification. No aneurysm or dissection. Right carotid system: Right common carotid artery widely patent to the bifurcation. Calcified plaque at the carotid bifurcation and ICA bulb but no stenosis. Cervical ICA mildly irregular but patent to the skull base. Left carotid system: Common carotid artery widely patent to the bifurcation. Extensive calcified plaque at the carotid bifurcation and ICA bulb but no stenosis. Cervical ICA widely patent to the skull base. Vertebral arteries: Calcified plaque at the right vertebral artery origin with 30% stenosis. Left vertebral  artery origin widely patent. Both vertebral arteries widely patent beyond that through the cervical region to the foramen magnum. Skeleton: Ordinary spondylosis. Other neck: No mass or lymphadenopathy. Upper chest:  Normal Review of the MIP images confirms the above findings CTA HEAD FINDINGS Anterior circulation: Both internal carotid arteries are patent through the skull base and siphon regions. There is atherosclerotic calcification in the carotid siphon regions but no stenosis greater than 30%. On the left, the anterior and middle cerebral vessels appear normal. On the right, there is embolic occlusion of the superior segment M2 branch. Posterior circulation: Both vertebral arteries are patent through the foramen magnum to the basilar. There is atherosclerotic disease in both V4 segments with stenoses estimated at 30-50%. No basilar stenosis. Posterior circulation branch vessels are patent. Venous sinuses: Patent and normal. Anatomic variants: None significant. Review of the MIP images confirms the above findings IMPRESSION: Embolic occlusion of the superior division M2 branch on the right. Atherosclerotic disease at both carotid bifurcation regions but no sign of flow limiting stenosis. On the initial CT, I probably underestimated the aspects score. I think there are some early changes higher than the temporal lobe, aspects probably 8 rather than 9. Case discussed with Dr. Cheral Marker during interpretation. Electronically Signed   By: Nelson Chimes M.D.   On: 01/24/2020 20:44   CT Code Stroke CTA Neck W/WO contrast  Result Date: 01/24/2020 CLINICAL DATA:  Left-sided weakness.  Neglect. EXAM: CT ANGIOGRAPHY HEAD AND NECK TECHNIQUE: Multidetector CT imaging of the head and neck was performed using the standard protocol during bolus administration of intravenous contrast. Multiplanar CT image reconstructions and MIPs were obtained to evaluate the vascular anatomy. Carotid stenosis measurements (when applicable) are obtained utilizing NASCET criteria, using the distal internal carotid diameter as the denominator. CONTRAST:  51mL OMNIPAQUE IOHEXOL 350 MG/ML SOLN COMPARISON:  Head CT earlier same day FINDINGS: CTA NECK FINDINGS  Aortic arch: Aortic atherosclerotic calcification. No aneurysm or dissection. Right carotid system: Right common carotid artery widely patent to the bifurcation. Calcified plaque at the carotid bifurcation and ICA bulb but no stenosis. Cervical ICA mildly irregular but patent to the skull base. Left carotid system: Common carotid artery widely patent to the bifurcation. Extensive calcified plaque at the carotid bifurcation and ICA bulb but no stenosis. Cervical ICA widely patent to the skull base. Vertebral arteries: Calcified plaque at the right vertebral artery origin with 30% stenosis. Left vertebral artery origin widely patent. Both vertebral arteries widely patent beyond that through the cervical region to the foramen magnum. Skeleton: Ordinary spondylosis. Other neck: No mass or lymphadenopathy. Upper chest: Normal Review of the MIP images confirms the above findings CTA HEAD FINDINGS Anterior circulation: Both internal carotid arteries are patent through the skull base and siphon regions. There is atherosclerotic calcification in the carotid siphon regions but no stenosis greater than 30%. On the left, the anterior and middle cerebral vessels appear normal. On the right, there is embolic occlusion of the superior segment M2 branch. Posterior circulation: Both vertebral arteries are patent through the foramen magnum to the basilar. There is atherosclerotic disease in both V4 segments with stenoses estimated at 30-50%. No basilar stenosis. Posterior circulation branch vessels are patent. Venous sinuses: Patent and normal. Anatomic variants: None significant. Review of the MIP images confirms the above findings IMPRESSION: Embolic occlusion of the superior division M2 branch on the right. Atherosclerotic disease at both carotid bifurcation regions but no sign of flow limiting stenosis. On the initial CT, I probably underestimated  the aspects score. I think there are some early changes higher than the temporal  lobe, aspects probably 8 rather than 9. Case discussed with Dr. Cheral Marker during interpretation. Electronically Signed   By: Nelson Chimes M.D.   On: 01/24/2020 20:44   CT CEREBRAL PERFUSION W CONTRAST  Result Date: 01/24/2020 CLINICAL DATA:  Left-sided weakness and neglect. EXAM: CT PERFUSION BRAIN TECHNIQUE: Multiphase CT imaging of the brain was performed following IV bolus contrast injection. Subsequent parametric perfusion maps were calculated using RAPID software. CONTRAST:  22mL OMNIPAQUE IOHEXOL 350 MG/ML SOLN COMPARISON:  CT head and CT angiography earlier same day. FINDINGS: CT Brain Perfusion Findings: CBF (<30%) Volume: 41mL Perfusion (Tmax>6.0s) volume: 142mL Mismatch Volume: 15mL ASPECTS on noncontrast CT Head: 8 at 2030 hours today. Infarct Core: 46 mL demonstrated in the frontoparietal brain. Completed infarction in the right temporal lobe not shown because of luxury perfusion. Infarction Location:Completed infarction in the right temporal lobe with luxury perfusion. New or completed infarction in the right frontoparietal brain. Additional 72 cc of at risk brain in the right MCA territory. IMPRESSION: Early subacute infarction in the right temporal lobe with pseudo normalization. Acute infarction at the right frontoparietal junction, 46 cc volume. Additional 72 cc of at risk brain in the right MCA territory. Results reported to Dr. Cheral Marker by text message at approximately 2055 hours. Electronically Signed   By: Nelson Chimes M.D.   On: 01/24/2020 21:03   CT HEAD CODE STROKE WO CONTRAST  Addendum Date: 01/24/2020   ADDENDUM REPORT: 01/24/2020 20:49 ADDENDUM: I think that the initial interpretation underestimated the disease. Whereas there is a subacute infarction in the temporal lobe, there are probably more acute changes with loss of gray-white differentiation above that in the frontoparietal region, making the aspects 8 rather than 9. Electronically Signed   By: Nelson Chimes M.D.   On:  01/24/2020 20:49   Result Date: 01/24/2020 CLINICAL DATA:  Code stroke.  Left-sided weakness. EXAM: CT HEAD WITHOUT CONTRAST TECHNIQUE: Contiguous axial images were obtained from the base of the skull through the vertex without intravenous contrast. COMPARISON:  None. FINDINGS: Brain: Subacute infarction in the right lateral temporal lobe. Mild swelling but no hemorrhage. No other acute finding. Elsewhere, there are mild chronic small-vessel changes of the white matter. No mass, hydrocephalus or extra-axial collection. Vascular: There is atherosclerotic calcification of the major vessels at the base of the brain. Skull: Negative Sinuses/Orbits: Clear/normal Other: None ASPECTS ([place] Stroke Program Early CT Score) - Ganglionic level infarction (caudate, lentiform nuclei, internal capsule, insula, M1-M3 cortex): 6 - Supraganglionic infarction (M4-M6 cortex): 3 Total score (0-10 with 10 being normal): 9 IMPRESSION: 1. Subacute infarction in the right lateral temporal lobe. No hemorrhage or mass effect. 2. ASPECTS is 9 3. These results were communicated to Dr. Cheral Marker at [place] 2/18/2021by text page via the [facility] messaging system. Electronically Signed: By: Nelson Chimes M.D. On: 01/24/2020 20:22    Labs:  CBC: Recent Labs    10/09/19 1234 10/09/19 1234 01/24/20 2006 01/24/20 2032 01/25/20 0150 01/25/20 0502  WBC 5.6  --  7.4  --  13.3*  --   HGB 14.8   < > 14.6 15.6 13.9 13.6  HCT 43.5   < > 43.2 46.0 40.3 40.0  PLT 239.0  --  264  --  269  --    < > = values in this interval not displayed.    COAGS: Recent Labs    01/24/20 2006  INR 1.0  APTT 27    BMP: Recent Labs    10/09/19 1234 10/09/19 1234 01/24/20 2006 01/24/20 2032 01/25/20 0150 01/25/20 0502  NA 137   < > 139 140 135 137  K 4.9   < > 3.5 3.4* 3.8 3.7  CL 99  --  104 104 103  --   CO2 30  --  20*  --  20*  --   GLUCOSE 91  --  103* 96 173*  --   BUN 25*  --  17 20 14   --   CALCIUM 9.9  --  9.7  --  8.3*  --     CREATININE 0.91  --  0.95 0.80 0.85  --   GFRNONAA  --   --  >60  --  >60  --   GFRAA  --   --  >60  --  >60  --    < > = values in this interval not displayed.    LIVER FUNCTION TESTS: Recent Labs    10/09/19 1234 01/24/20 2006  BILITOT 0.7 1.1  AST 25 50*  ALT 26 55*  ALKPHOS 46 40  PROT 7.3 7.3  ALBUMIN 4.6 4.2    Assessment and Plan:  History of acute CVA s/p cerebral arteriogram with emergent mechanical thrombectomy of right MCA inferior division achieving a TICI 2B revascularization 01/24/2020 by Dr. Estanislado Pandy. Patient's condition stable- remains intubated/sedated, opens eyes to voice and follows simple commands, moving right side and LLE but no movements of LUE. Right groin incision stable with sheath in place- sheath was removed at 1047. Manual pressure was held by this PA and Dr. Estanislado Pandy for over 20 minutes and hemostasis was achieved at approximately 1117. Pressure dressing with syringe applied to site. Distal pulses 1+ on left and palpable with Doppler on right. Site reviewed with Estill Bamberg, RN. Post-sheath orders in place. Further plans per neurology/CCM- appreciate and agree with management. NIR to follow.   Electronically Signed: Earley Abide, PA-C 01/25/2020, 10:29 AM   I spent a total of 35 Minutes at the the patient's bedside AND on the patient's hospital floor or unit, greater than 50% of which was counseling/coordinating care for right MCA inferior division occlusion s/p revascularization.

## 2020-01-25 NOTE — Consult Note (Signed)
NAME:  Ruben Chavez, MRN:  [id number], DOB:  Nov 27, 1951, LOS: 1 ADMISSION DATE:  01/24/2020, CONSULTATION DATE:  01/25/20 REFERRING MD:  Cheral Marker  CHIEF COMPLAINT:  AMS   Brief History   Ruben Chavez is a 69 y.o. male who was admitted 2/18 with acute CVA 2/2 right M2 occlusion.  He received tPA and was taken for IR revascularization.  History of present illness   Pt is encephelopathic; therefore, this HPI is obtained from chart review. Ruben Chavez is a 69 y.o. male who has a PMH including but not limited to HTN, HLD, prostate CA (see "past medical history" for rest).  He presented to [facility] ED 2/18 with left sided weakness, left sided sensory loss, left facial droop, dysarthria, left visual field cut and anosognosia.  He received tPA.  CT revealed early ischemic infarct in right temporal lobe.  CTA revealed right M2 occlusion.  He was taken to IR for revascularization.  Post procedure, he was transferred to the ICU and PCCM was asked to assist with vent management.  Past Medical History  has WART, LEFT HAND; ERECTILE DYSFUNCTION, MILD; HERNIA, BILATERAL INGUINAL W/O OBST/GANGRENE; ACTINIC KERATOSIS, FOREHEAD, LEFT; BACK PAIN, LEFT; LATERAL EPICONDYLITIS; PROSTATE SPECIFIC ANTIGEN, ELEVATED; MENISCUS TEAR; History of colonic polyps; Essential hypertension; Prostate cancer ([place]); OA (osteoarthritis) of hip; Hyperlipidemia; Impingement syndrome of right shoulder region; Bilateral inguinal hernia; Stroke ([place]); Middle cerebral artery embolism, right; and Stroke (cerebrum) ([place]) on their problem list.  Significant Hospital Events   2/18 > admit.  Consults:  PCCM.  Procedures:  ETT 2/18 >  R fem art line 2/18 >   Significant Diagnostic Tests:  CT / CTA / CTP head 2/18 > subacute infarction of right temporal lobe. Embolic occlusion of the superior M2 branch on the right.  MRI brain 2/19 >  Echo 2/19 >   Micro Data:  Flu 2/18 > neg. COVID 2/18 > neg.  Antimicrobials:  None.    Interim history/subjective:  Sedated heavily due to shaking and moving right leg.  SBP 115 range, goal 120 - 140.  Objective:  Blood pressure (!) 85/57, pulse 65, temperature 98.5 F (36.9 C), temperature source Oral, resp. rate 16, height 5\' 9"  (1.753 m), weight 81.6 kg, SpO2 100 %.    FiO2 (%):  [40 %] 40 % Set Rate:  [16 bmp] 16 bmp Vt Set:  [560 mL] 560 mL PEEP:  [5 cmH20] 5 cmH20 Plateau Pressure:  [14 cmH20] 14 cmH20   Intake/Output Summary (Last 24 hours) at 01/25/2020 0044 Last data filed at 01/25/2020 0005 Gross per 24 hour  Intake 1000 ml  Output 20 ml  Net 980 ml   Filed Weights   01/24/20 2000  Weight: 81.6 kg    Examination: General: Adult male, in NAD. Neuro: Sedated, not fully responsive but is moving right side.Marland Kitchen HEENT: [place]/AT. Sclerae anicteric.  ETT in place. Cardiovascular: RRR, no M/R/G.  Lungs: Respirations even and unlabored.  CTA bilaterally, No W/R/R.  Abdomen: BS x 4, soft, NT/ND.  Musculoskeletal: No gross deformities, no edema.  Skin: Intact, warm, no rashes.  Assessment & Plan:   Acute CVA 2/2 right M2 occlusion - s/p tPA  followed by IR revascularization. - Post procedure management per IR. - Stroke workup / management per neuro. - F/u on CT head, MRI brain, echo.   Hypotension - 2/2 sedation. - Low dose levophed as needed for goal SBP 120 - 140. - D/c lisinopril / HCTZ (home med).  Respiratory insufficiency -  due to inability to protect the airway in the setting of above. - Full vent support. - Assess ABG. - Wean as able. - Daily SBT. - Bronchial hygiene. - Follow CXR.  Hypokalemia. - 40 mEq K per tube. - Follow BMP.  Hypocalcemia. - 1g Ca gluconate.  Hx EtOH use. - Thiamine / Folate.   Best Practice:  Diet: NPO. Pain/Anxiety/Delirium protocol (if indicated): Propofol gtt / Fentanyl gtt / Midazolam gtt.  RASS goal 0 to -1. VAP protocol (if indicated): In place. DVT prophylaxis: SCD's. GI prophylaxis: PPI. Glucose  control: SSI if glucose consistently > 180. Mobility: BR for now. Code Status: Full. Family Communication: Per primary. Disposition: ICU.  Labs   CBC: Recent Labs  Lab 01/24/20 2006 01/24/20 2032  WBC 7.4  --   NEUTROABS 4.7  --   HGB 14.6 15.6  HCT 43.2 46.0  MCV 94.7  --   PLT 264  --    Basic Metabolic Panel: Recent Labs  Lab 01/24/20 2006 01/24/20 2032  NA 139 140  K 3.5 3.4*  CL 104 104  CO2 20*  --   GLUCOSE 103* 96  BUN 17 20  CREATININE 0.95 0.80  CALCIUM 9.7  --    GFR: Estimated Creatinine Clearance: 85.9 mL/min (by C-G formula based on SCr of 0.8 mg/dL). Recent Labs  Lab 01/24/20 2006  WBC 7.4   Liver Function Tests: Recent Labs  Lab 01/24/20 2006  AST 50*  ALT 55*  ALKPHOS 40  BILITOT 1.1  PROT 7.3  ALBUMIN 4.2   No results for input(s): LIPASE, AMYLASE in the last 168 hours. No results for input(s): AMMONIA in the last 168 hours. ABG    Component Value Date/Time   TCO2 28 01/24/2020 2032    Coagulation Profile: Recent Labs  Lab 01/24/20 2006  INR 1.0   Cardiac Enzymes: No results for input(s): CKTOTAL, CKMB, CKMBINDEX, TROPONINI in the last 168 hours. HbA1C: Hgb A1c MFr Bld  Date/Time Value Ref Range Status  10/09/2019 12:34 PM 5.3 4.6 - 6.5 % Final    Comment:    Glycemic Control Guidelines for People with Diabetes:Non Diabetic:  <6%Goal of Therapy: <7%Additional Action Suggested:  >8%    CBG: Recent Labs  Lab 01/24/20 2005  GLUCAP 89    Review of Systems:   Unable to obtain as pt is encephalopathic.  Past medical history  He,  has a past medical history of Elevated PSA, Hyperlipidemia, Hypertension, and Prostate cancer ([place]) (2013).   Surgical History    Past Surgical History:  Procedure Laterality Date  . bilateral inguinal hernia  2008  . CYST REMOVAL TRUNK  2016   sebaceous cyst  . CYSTOSCOPY  04/22/2016   Procedure: CYSTOSCOPY;  Surgeon: [name], MD;  Location[facility];   Service: Urology;;  . KNEE ARTHROSCOPY  2007   right  . PROSTATE BIOPSY  2008  . PROSTATE BIOPSY  2013  . PROSTATE BIOPSY  01/28/2016  . RADIOACTIVE SEED IMPLANT N/A 04/22/2016   Procedure: RADIOACTIVE SEED IMPLANT/BRACHYTHERAPY IMPLANT;  Surgeon: [name], MD;  Location: [facility];  Service: Urology;  Laterality: N/A;  . TONSILLECTOMY    . TOTAL HIP ARTHROPLASTY  2001   left  . TOTAL HIP ARTHROPLASTY Right 01/18/2018   Procedure: RIGHT TOTAL HIP ARTHROPLASTY ANTERIOR APPROACH;  Surgeon: [name], MD;  Location: [facility];  Service: Orthopedics;  Laterality: Right;     Social History   reports that he quit smoking  about 20 years ago. His smoking use included cigarettes. He has a 32.00 pack-year smoking history. He has never used smokeless tobacco. He reports current alcohol use of about 14.0 standard drinks of alcohol per week. He reports that he does not use drugs.   Family history   His family history includes Heart disease in his father; Pancreatic cancer in his mother. There is no history of Colon cancer.   Allergies Allergies  Allergen Reactions  . Advil [Ibuprofen] Other (See Comments)    "Dots on chest" (Petechiae)  . Aleve [Naproxen] Other (See Comments)    "Dots on chest" (Petechiae)  . Sulfa Antibiotics Swelling and Other (See Comments)    Swelling in ankles   . Betadine [Povidone Iodine] Itching and Rash  . Chloroxylenol (Antiseptic) Itching, Rash and Other (See Comments)    PCMX surgical sterilizing scrub  . Poison Ivy Extract Rash  . Povidone-Iodine Itching and Rash    BETADINE     Home meds  Prior to Admission medications   Medication Sig Start Date End Date Taking? Authorizing Provider  cephALEXin (KEFLEX) 500 MG capsule Take 2,000 mg by mouth See admin instructions. Take 2,000 mg by mouth one hour prior to dental appointment   Yes [provider]  HYDROcodone-acetaminophen (NORCO/VICODIN) 5-325 MG tablet Take 1 tablet by  mouth every 6 (six) hours as needed for moderate pain. 11/16/19  Yes Isaac Bliss, Rayford Halsted, MD  lisinopril-hydrochlorothiazide (ZESTORETIC) 20-25 MG tablet Take 1 tablet by mouth daily. 09/19/19  Yes Isaac Bliss, Rayford Halsted, MD  methocarbamol (ROBAXIN) 500 MG tablet Take 1 tablet (500 mg total) by mouth every 8 (eight) hours as needed for muscle spasms. 11/12/19  Yes Billie Ruddy, MD    Critical care time: 35 min.    Montey Hora, [place] [facility] 01/25/2020, 12:44 AM

## 2020-01-25 NOTE — Progress Notes (Signed)
MD/PA paged at 1015 d/t DP/PT pulses absent. MD aware at 0900 that DP pulse was no longer able to be dopplered. PT unable to be dopplered at 1000. MD/PA to bedside at 1030 to remove sheath. Post sheath removal both pulses dopplerable. Shortly after, DP pulse 2+ palpable.

## 2020-01-25 NOTE — Progress Notes (Signed)
NAME:  Ruben Chavez, MRN:  [id number], DOB:  [birth year], LOS: 1 ADMISSION DATE:  01/24/2020, CONSULTATION DATE:  01/25/20 REFERRING MD:  Cheral Marker  CHIEF COMPLAINT:  AMS   Brief History   Ruben Chavez is a 69 y.o. male who was admitted 2/18 with acute CVA 2/2 right M2 occlusion.  He received tPA and was taken for IR revascularization.  History of present illness    Ruben Chavez is a 69 y.o. male who has a PMH including but not limited to HTN, HLD, prostate CA (see "past medical history" for rest).  He presented to [facility] ED 2/18 with left sided weakness, left sided sensory loss, left facial droop, dysarthria, left visual field cut and anosognosia.  He received tPA.  CT revealed early ischemic infarct in right temporal lobe.  CTA revealed right M2 occlusion.  He was taken to IR for revascularization.  Post procedure, he was transferred to the ICU and PCCM was asked to assist with vent management.  Past Medical History  has WART, LEFT HAND; ERECTILE DYSFUNCTION, MILD; HERNIA, BILATERAL INGUINAL W/O OBST/GANGRENE; ACTINIC KERATOSIS, FOREHEAD, LEFT; BACK PAIN, LEFT; LATERAL EPICONDYLITIS; PROSTATE SPECIFIC ANTIGEN, ELEVATED; MENISCUS TEAR; History of colonic polyps; Essential hypertension; Prostate cancer ([place]); OA (osteoarthritis) of hip; Hyperlipidemia; Impingement syndrome of right shoulder region; Bilateral inguinal hernia; Stroke ([place]); Middle cerebral artery embolism, right; Stroke (cerebrum) ([place]); Acute respiratory failure with hypoxia ([place]); Hypotension; Hypokalemia; Hypocalcemia; and Encephalopathy acute on their problem list.  Significant Hospital Events   2/18 > admit.  Given TPA and subsequently underwent thrombectomy on 2/18  Consults:  PCCM.  Procedures:  ETT 2/18 >  R fem art line 2/18 >   Significant Diagnostic Tests:  CT / CTA / CTP head 2/18 > subacute infarction of right temporal lobe. Embolic occlusion of the superior M2 branch on the right.  MRI brain 2/19 >    Echo 2/19 >   Micro Data:  Flu 2/18 > neg. COVID 2/18 > neg.  Antimicrobials:  None.   Interim history/subjective:  Sedated heavily due to shaking and moving right leg.  SBP 115 range, goal 120 - 140.  Objective:  Blood pressure 100/82, pulse (!) 55, temperature 97.8 F (36.6 C), temperature source Oral, resp. rate (!) 21, height 5\' 9"  (1.753 m), weight 81.6 kg, SpO2 100 %.    Vent Mode: PRVC FiO2 (%):  [40 %] 40 % Set Rate:  [16 bmp] 16 bmp Vt Set:  [560 mL] 560 mL PEEP:  [5 cmH20] 5 cmH20 Plateau Pressure:  [14 cmH20-18 cmH20] 18 cmH20   Intake/Output Summary (Last 24 hours) at 01/25/2020 0841 Last data filed at 01/25/2020 0700 Gross per 24 hour  Intake 1901.42 ml  Output 20 ml  Net 1881.42 ml   Filed Weights   01/24/20 2000  Weight: 81.6 kg    Examination: General: Orally intubated, mechanically ventilated, and in no distress.   Neuro: He is nodding questions to me and following simple instructions.  The face is symmetrical to pupils are equal.  He is able to move the right limbs on request but not the left.   HEENT: [place]/AT. Sclerae anicteric.  ETT in place. Cardiovascular: S1 and S2 are regular without murmur rub or gallop   Lungs: I have switched him to CPAP with a pressure support of 10.  Respirations are unlabored on that setting there is symmetric air movement no wheezes.   Abdomen: The abdomen is soft without organomegaly masses tenderness guarding or rebound.  He is anicteric.  BS x 4, soft, NT/ND.  Musculoskeletal: No deformity or edema Skin: There is no ecchymosis or hematoma at the right groin sheath site.  Assessment & Plan:   Acute CVA 2/2 right M2 occlusion - s/p tPA  followed by IR revascularization. - Post procedure management per IR. - Stroke workup / management per neuro. - F/u on CT head, MRI brain, echo.   Hypotension - 2/2 sedation. - Low dose levophed as needed for goal SBP 120 - 140. - D/c lisinopril / HCTZ (home med).  Respiratory  insufficiency - due to inability to protect the airway in the setting of above. -I have decreased his sedation during my visit this morning and at this point he is tolerating CPAP with a pressure support of 10 very nicely.  I am anticipating his sheath being removed in the very near future and will put proceed towards extubation if there are no complications from removal of the sheath.  Hypokalemia. -Corrected - Follow BMP.  Hypocalcemia. - 1g Ca gluconate.  Hx EtOH use. - Thiamine / Folate.   Best Practice:  Diet: NPO. Pain/Anxiety/Delirium protocol (if indicated): Propofol gtt / Fentanyl gtt / Midazolam gtt.  RASS goal 0 to -1. VAP protocol (if indicated): In place. DVT prophylaxis: SCD's. GI prophylaxis: PPI. Glucose control: SSI if glucose consistently > 180. Mobility: BR for now. Code Status: Full. Family Communication: Per primary. Disposition: ICU.  Labs   CBC: Recent Labs  Lab 01/24/20 2006 01/24/20 2032 01/25/20 0150 01/25/20 0502  WBC 7.4  --  13.3*  --   NEUTROABS 4.7  --  11.2*  --   HGB 14.6 15.6 13.9 13.6  HCT 43.2 46.0 40.3 40.0  MCV 94.7  --  93.9  --   PLT 264  --  269  --    Basic Metabolic Panel: Recent Labs  Lab 01/24/20 2006 01/24/20 2032 01/25/20 0150 01/25/20 0502  NA 139 140 135 137  K 3.5 3.4* 3.8 3.7  CL 104 104 103  --   CO2 20*  --  20*  --   GLUCOSE 103* 96 173*  --   BUN 17 20 14   --   CREATININE 0.95 0.80 0.85  --   CALCIUM 9.7  --  8.3*  --    GFR: Estimated Creatinine Clearance: 80.9 mL/min (by C-G formula based on SCr of 0.85 mg/dL). Recent Labs  Lab 01/24/20 2006 01/25/20 0150  WBC 7.4 13.3*   Liver Function Tests: Recent Labs  Lab 01/24/20 2006  AST 50*  ALT 55*  ALKPHOS 40  BILITOT 1.1  PROT 7.3  ALBUMIN 4.2   No results for input(s): LIPASE, AMYLASE in the last 168 hours. No results for input(s): AMMONIA in the last 168 hours. ABG    Component Value Date/Time   PHART 7.410 01/25/2020 0502   PCO2ART  35.1 01/25/2020 0502   PO2ART 188.0 (H) 01/25/2020 0502   HCO3 22.3 01/25/2020 0502   TCO2 23 01/25/2020 0502   ACIDBASEDEF 2.0 01/25/2020 0502   O2SAT 100.0 01/25/2020 0502    Coagulation Profile: Recent Labs  Lab 01/24/20 2006  INR 1.0   Cardiac Enzymes: No results for input(s): CKTOTAL, CKMB, CKMBINDEX, TROPONINI in the last 168 hours. HbA1C: Hgb A1c MFr Bld  Date/Time Value Ref Range Status  01/25/2020 01:50 AM 5.0 4.8 - 5.6 % Final    Comment:    (NOTE) Pre diabetes:          5.7%-6.4% Diabetes:              >  6.4% Glycemic control for   <7.0% adults with diabetes   10/09/2019 12:34 PM 5.3 4.6 - 6.5 % Final    Comment:    Glycemic Control Guidelines for People with Diabetes:Non Diabetic:  <6%Goal of Therapy: <7%Additional Action Suggested:  >8%    CBG: Recent Labs  Lab 01/24/20 2005  GLUCAP 89    Review of Systems:   Unable to obtain as pt is encephalopathic.  Past medical history  He,  has a past medical history of Elevated PSA, Hyperlipidemia, Hypertension, and Prostate cancer ([place]) (2013).   Surgical History    Past Surgical History:  Procedure Laterality Date  . bilateral inguinal hernia  2008  . CYST REMOVAL TRUNK  2016   sebaceous cyst  . CYSTOSCOPY  04/22/2016   Procedure: CYSTOSCOPY;  Surgeon: [name], MD;  Location: [facility];  Service: Urology;;  . KNEE ARTHROSCOPY  2007   right  . PROSTATE BIOPSY  2008  . PROSTATE BIOPSY  2013  . PROSTATE BIOPSY  01/28/2016  . RADIOACTIVE SEED IMPLANT N/A 04/22/2016   Procedure: RADIOACTIVE SEED IMPLANT/BRACHYTHERAPY IMPLANT;  Surgeon: [name], MD;  Location: [facility];  Service: Urology;  Laterality: N/A;  . TONSILLECTOMY    . TOTAL HIP ARTHROPLASTY  2001   left  . TOTAL HIP ARTHROPLASTY Right 01/18/2018   Procedure: RIGHT TOTAL HIP ARTHROPLASTY ANTERIOR APPROACH;  Surgeon: [name], MD;  Location: [facility];  Service: Orthopedics;  Laterality: Right;       Social History   reports that he quit smoking about 20 years ago. His smoking use included cigarettes. He has a 32.00 pack-year smoking history. He has never used smokeless tobacco. He reports current alcohol use of about 14.0 standard drinks of alcohol per week. He reports that he does not use drugs.   Family history   His family history includes Heart disease in his father; Pancreatic cancer in his mother. There is no history of Colon cancer.   Allergies Allergies  Allergen Reactions  . Advil [Ibuprofen] Other (See Comments)    "Dots on chest" (Petechiae)  . Aleve [Naproxen] Other (See Comments)    "Dots on chest" (Petechiae)  . Sulfa Antibiotics Swelling and Other (See Comments)    Swelling in ankles   . Betadine [Povidone Iodine] Itching and Rash  . Chloroxylenol (Antiseptic) Itching, Rash and Other (See Comments)    PCMX surgical sterilizing scrub  . Poison Ivy Extract Rash  . Povidone-Iodine Itching and Rash    BETADINE     Home meds  Prior to Admission medications   Medication Sig Start Date End Date Taking? Authorizing Provider  cephALEXin (KEFLEX) 500 MG capsule Take 2,000 mg by mouth See admin instructions. Take 2,000 mg by mouth one hour prior to dental appointment   Yes [provider]  HYDROcodone-acetaminophen (NORCO/VICODIN) 5-325 MG tablet Take 1 tablet by mouth every 6 (six) hours as needed for moderate pain. 11/16/19  Yes Isaac Bliss, Rayford Halsted, MD  lisinopril-hydrochlorothiazide (ZESTORETIC) 20-25 MG tablet Take 1 tablet by mouth daily. 09/19/19  Yes Isaac Bliss, Rayford Halsted, MD  methocarbamol (ROBAXIN) 500 MG tablet Take 1 tablet (500 mg total) by mouth every 8 (eight) hours as needed for muscle spasms. 11/12/19  Yes Billie Ruddy, MD    Critical care time: 32  min.   Lars Masson MD Critical Care Medicine 01/25/2020, 8:41 AM

## 2020-01-25 NOTE — Procedures (Signed)
Extubation Procedure Note  Patient Details:   Name: ANGELICA STABLEIN DOB: May 07, 1951 MRN: [id number]   Airway Documentation:    Vent end date: 01/25/20 Vent end time: 1735   Evaluation  O2 sats: stable throughout Complications: No apparent complications Patient did tolerate procedure well. Bilateral Breath Sounds: Clear   Yes, pt could speak post extubation.  Pt extubated to 4 l/m [place] per physician's order.  Earney Navy 01/25/2020, 5:37 PM

## 2020-01-25 NOTE — Progress Notes (Signed)
PT Cancellation Note  Patient Details Name: Ruben Chavez MRN: [id number] DOB: 1951-07-10   Cancelled Treatment:    Reason Eval/Treat Not Completed: Active bedrest order Pt still intubated with femoral sheath and on bedrest. Will await increase in activity orders and appropriateness prior to PT evaluation.    [name] A [name] 01/25/2020, 6:48 AM [name], PT, DPT Acute Rehabilitation Services Pager [phone] Office [phone]

## 2020-01-25 NOTE — Progress Notes (Signed)
OT Cancellation Note  Patient Details Name: Ruben Chavez MRN: [id number] DOB: 08-01-1951   Cancelled Treatment:    Reason Eval/Treat Not Completed: Patient not medically ready;Active bedrest order. Intubated with femoral sheath and on bedrest. Will return as schedule allows and pt medically ready.  [place], OTR/L Acute Rehab Pager: [phone] Office: [phone] 01/25/2020, 7:30 AM

## 2020-01-25 NOTE — Sedation Documentation (Signed)
Groins and pulses assessed at beside with Merrilee Seashore, RN. No change, see flowsheet. All questions answered to satisfaction.

## 2020-01-25 NOTE — Progress Notes (Signed)
STROKE TEAM PROGRESS NOTE   INTERVAL HISTORY I have personally reviewed history of presenting illness, electronic medical records and imaging films in PACS. He presented with sudden onset of left hemiplegia, hemisensory loss and high NIH stroke scale of 18.  CT angiogram showed embolic occlusion of superior division of left M2 branch on the right with 70 cc of penumbra at risk in the right MCA territory and underwent successful mechanical thrombectomy.  Ruben Chavez blood pressure has been adequately controlled overnight.  He is arousable and follows commands.  He still has right groin arterial sheath. Vitals:   01/25/20 0700 01/25/20 0730 01/25/20 0734 01/25/20 0800  BP: (!) 107/59 (!) 116/55  100/82  Pulse: (!) 49 (!) 58  (!) 55  Resp: 16 19  (!) 21  Temp:      TempSrc:      SpO2: 100% 100% 100% 100%  Weight:      Height:        CBC:  Recent Labs  Lab 01/24/20 2006 01/24/20 2032 01/25/20 0150 01/25/20 0502  WBC 7.4  --  13.3*  --   NEUTROABS 4.7  --  11.2*  --   HGB 14.6   < > 13.9 13.6  HCT 43.2   < > 40.3 40.0  MCV 94.7  --  93.9  --   PLT 264  --  269  --    < > = values in this interval not displayed.    Basic Metabolic Panel:  Recent Labs  Lab 01/24/20 2006 01/24/20 2006 01/24/20 2032 01/24/20 2032 01/25/20 0150 01/25/20 0502  NA 139   < > 140   < > 135 137  K 3.5   < > 3.4*   < > 3.8 3.7  CL 104   < > 104  --  103  --   CO2 20*  --   --   --  20*  --   GLUCOSE 103*   < > 96  --  173*  --   BUN 17   < > 20  --  14  --   CREATININE 0.95   < > 0.80  --  0.85  --   CALCIUM 9.7  --   --   --  8.3*  --    < > = values in this interval not displayed.   Lipid Panel:     Component Value Date/Time   CHOL 117 01/25/2020 0150   TRIG 135 01/25/2020 0150   HDL 36 (L) 01/25/2020 0150   CHOLHDL 3.3 01/25/2020 0150   VLDL 27 01/25/2020 0150   LDLCALC 54 01/25/2020 0150   HgbA1c:  Lab Results  Component Value Date   HGBA1C 5.0 01/25/2020   Urine Drug Screen: No results  found for: LABOPIA, COCAINSCRNUR, LABBENZ, AMPHETMU, THCU, LABBARB  Alcohol Level     Component Value Date/Time   ETH <10 01/24/2020 2006    IMAGING past 48 hours CT Code Stroke CTA Head W/WO contrast  Result Date: 01/24/2020 CLINICAL DATA:  Left-sided weakness.  Neglect. EXAM: CT ANGIOGRAPHY HEAD AND NECK TECHNIQUE: Multidetector CT imaging of the head and neck was performed using the standard protocol during bolus administration of intravenous contrast. Multiplanar CT image reconstructions and MIPs were obtained to evaluate the vascular anatomy. Carotid stenosis measurements (when applicable) are obtained utilizing NASCET criteria, using the distal internal carotid diameter as the denominator. CONTRAST:  46m OMNIPAQUE IOHEXOL 350 MG/ML SOLN COMPARISON:  Head CT earlier same day FINDINGS: CTA NECK  FINDINGS Aortic arch: Aortic atherosclerotic calcification. No aneurysm or dissection. Right carotid system: Right common carotid artery widely patent to the bifurcation. Calcified plaque at the carotid bifurcation and ICA bulb but no stenosis. Cervical ICA mildly irregular but patent to the skull base. Left carotid system: Common carotid artery widely patent to the bifurcation. Extensive calcified plaque at the carotid bifurcation and ICA bulb but no stenosis. Cervical ICA widely patent to the skull base. Vertebral arteries: Calcified plaque at the right vertebral artery origin with 30% stenosis. Left vertebral artery origin widely patent. Both vertebral arteries widely patent beyond that through the cervical region to the foramen magnum. Skeleton: Ordinary spondylosis. Other neck: No mass or lymphadenopathy. Upper chest: Normal Review of the MIP images confirms the above findings CTA HEAD FINDINGS Anterior circulation: Both internal carotid arteries are patent through the skull base and siphon regions. There is atherosclerotic calcification in the carotid siphon regions but no stenosis greater than 30%. On  the left, the anterior and middle cerebral vessels appear normal. On the right, there is embolic occlusion of the superior segment M2 branch. Posterior circulation: Both vertebral arteries are patent through the foramen magnum to the basilar. There is atherosclerotic disease in both V4 segments with stenoses estimated at 30-50%. No basilar stenosis. Posterior circulation branch vessels are patent. Venous sinuses: Patent and normal. Anatomic variants: None significant. Review of the MIP images confirms the above findings IMPRESSION: Embolic occlusion of the superior division M2 branch on the right. Atherosclerotic disease at both carotid bifurcation regions but no sign of flow limiting stenosis. On the initial CT, I probably underestimated the aspects score. I think there are some early changes higher than the temporal lobe, aspects probably 8 rather than 9. Case discussed with Dr. Cheral Marker during interpretation. Electronically Signed   By: Nelson Chimes M.D.   On: 01/24/2020 20:44   CT Code Stroke CTA Neck W/WO contrast  Result Date: 01/24/2020 CLINICAL DATA:  Left-sided weakness.  Neglect. EXAM: CT ANGIOGRAPHY HEAD AND NECK TECHNIQUE: Multidetector CT imaging of the head and neck was performed using the standard protocol during bolus administration of intravenous contrast. Multiplanar CT image reconstructions and MIPs were obtained to evaluate the vascular anatomy. Carotid stenosis measurements (when applicable) are obtained utilizing NASCET criteria, using the distal internal carotid diameter as the denominator. CONTRAST:  70m OMNIPAQUE IOHEXOL 350 MG/ML SOLN COMPARISON:  Head CT earlier same day FINDINGS: CTA NECK FINDINGS Aortic arch: Aortic atherosclerotic calcification. No aneurysm or dissection. Right carotid system: Right common carotid artery widely patent to the bifurcation. Calcified plaque at the carotid bifurcation and ICA bulb but no stenosis. Cervical ICA mildly irregular but patent to the skull  base. Left carotid system: Common carotid artery widely patent to the bifurcation. Extensive calcified plaque at the carotid bifurcation and ICA bulb but no stenosis. Cervical ICA widely patent to the skull base. Vertebral arteries: Calcified plaque at the right vertebral artery origin with 30% stenosis. Left vertebral artery origin widely patent. Both vertebral arteries widely patent beyond that through the cervical region to the foramen magnum. Skeleton: Ordinary spondylosis. Other neck: No mass or lymphadenopathy. Upper chest: Normal Review of the MIP images confirms the above findings CTA HEAD FINDINGS Anterior circulation: Both internal carotid arteries are patent through the skull base and siphon regions. There is atherosclerotic calcification in the carotid siphon regions but no stenosis greater than 30%. On the left, the anterior and middle cerebral vessels appear normal. On the right, there is embolic occlusion of the  superior segment M2 branch. Posterior circulation: Both vertebral arteries are patent through the foramen magnum to the basilar. There is atherosclerotic disease in both V4 segments with stenoses estimated at 30-50%. No basilar stenosis. Posterior circulation branch vessels are patent. Venous sinuses: Patent and normal. Anatomic variants: None significant. Review of the MIP images confirms the above findings IMPRESSION: Embolic occlusion of the superior division M2 branch on the right. Atherosclerotic disease at both carotid bifurcation regions but no sign of flow limiting stenosis. On the initial CT, I probably underestimated the aspects score. I think there are some early changes higher than the temporal lobe, aspects probably 8 rather than 9. Case discussed with Dr. Cheral Marker during interpretation. Electronically Signed   By: Nelson Chimes M.D.   On: 01/24/2020 20:44   CT CEREBRAL PERFUSION W CONTRAST  Result Date: 01/24/2020 CLINICAL DATA:  Left-sided weakness and neglect. EXAM: CT  PERFUSION BRAIN TECHNIQUE: Multiphase CT imaging of the brain was performed following IV bolus contrast injection. Subsequent parametric perfusion maps were calculated using RAPID software. CONTRAST:  55m OMNIPAQUE IOHEXOL 350 MG/ML SOLN COMPARISON:  CT head and CT angiography earlier same day. FINDINGS: CT Brain Perfusion Findings: CBF (<30%) Volume: 447mPerfusion (Tmax>6.0s) volume: 11871mismatch Volume: 21m73mPECTS on noncontrast CT Head: 8 at 2030 hours today. Infarct Core: 46 mL demonstrated in the frontoparietal brain. Completed infarction in the right temporal lobe not shown because of luxury perfusion. Infarction Location:Completed infarction in the right temporal lobe with luxury perfusion. New or completed infarction in the right frontoparietal brain. Additional 72 cc of at risk brain in the right MCA territory. IMPRESSION: Early subacute infarction in the right temporal lobe with pseudo normalization. Acute infarction at the right frontoparietal junction, 46 cc volume. Additional 72 cc of at risk brain in the right MCA territory. Results reported to Dr. LindCheral Markertext message at approximately 2055 hours. Electronically Signed   By: MarkNelson Chimes.   On: 01/24/2020 21:03   CT HEAD CODE STROKE WO CONTRAST  Addendum Date: 01/24/2020   ADDENDUM REPORT: 01/24/2020 20:49 ADDENDUM: I think that the initial interpretation underestimated the disease. Whereas there is a subacute infarction in the temporal lobe, there are probably more acute changes with loss of gray-white differentiation above that in the frontoparietal region, making the aspects 8 rather than 9. Electronically Signed   By: MarkNelson Chimes.   On: 01/24/2020 20:49   Result Date: 01/24/2020 CLINICAL DATA:  Code stroke.  Left-sided weakness. EXAM: CT HEAD WITHOUT CONTRAST TECHNIQUE: Contiguous axial images were obtained from the base of the skull through the vertex without intravenous contrast. COMPARISON:  None. FINDINGS: Brain:  Subacute infarction in the right lateral temporal lobe. Mild swelling but no hemorrhage. No other acute finding. Elsewhere, there are mild chronic small-vessel changes of the white matter. No mass, hydrocephalus or extra-axial collection. Vascular: There is atherosclerotic calcification of the major vessels at the base of the brain. Skull: Negative Sinuses/Orbits: Clear/normal Other: None ASPECTS ([place] Program Early CT Score) - Ganglionic level infarction (caudate, lentiform nuclei, internal capsule, insula, M1-M3 cortex): 6 - Supraganglionic infarction (M4-M6 cortex): 3 Total score (0-10 with 10 being normal): 9 IMPRESSION: 1. Subacute infarction in the right lateral temporal lobe. No hemorrhage or mass effect. 2. ASPECTS is 9 3. These results were communicated to Dr. LindCheral [id number] text page via the [facility] system. Electronically Signed: By: MarkNelson Chimes. On: 01/24/2020 20:22   Cerebral Angio 2/18/20121 S/P RT common  carotid arteriogram followed by revascularization of occluded inf division of Rt MCA with x2 passes with 87m x 40 mm solitaireX retriever and x 1 pass with 36mx 32 mm Trevoprovue retriever and penumbra aspiration achieving a TICI 2b revascularization.  PHYSICAL EXAM Ruben Chavez who is intubated and sedated.  Not in distress. . Afebrile. Head is nontraumatic. Neck is supple without bruit.    Cardiac exam no murmur or gallop. Lungs are clear to auscultation. Distal pulses are well felt. Neurological Exam :  Patient is sedated intubated.  Ruben Chavez eyes are open.  He blinks to threat more on the right than on the left.  Left lower facial weakness.  Tongue midline he follows simple commands with gaze and extremities on the right.  Is able to move left leg partially of the bed with remains plegic in the left arm with hypotonia and flaccidity. ASSESSMENT/PLAN Ruben Chavez a [age].o. Chavez with history of HTN, HLD, prostate  cancer presenting with L sided weakness, L hemisensory loss, L facial droop, L field cut, dysarthria and anosognosia. Received tPA 01/24/2020 at 2024. Taken to IR for R MCA occlusion.   Stroke:   R MAC infarct s/p tPA and IR for R MCA occlusion w/ partial recanalization - embolic secondary to unknown source  Code Stroke CT head subacute R lateral temporal lobe and temporoparietal area infarcts. ASPECTS 8    CTA head & neck R M2 superior division occlusion. B ICA bifurcation atherosclerosis.   CT perfusion pseudonormalization R temporal lobe. R frontoparietal jxn infarct 46cc, R MCA 72cc.  Cerebral angio TICI2b revascularization R MCA w/ solitaire, trevo and penumbra aspiration   Post IR CT contrast stain in the temporal parietal cortical subcortical region and a small focal  Overlying hyperintensity ? Contrast with SAH. No mass effect.  MRI  pending   2D Echo pending   Consider loop placement at d/c if source not found  LDL 54  HgbA1c 5.0  SCDs for VTE prophylaxis  No antithrombotic prior to admission, now on No antithrombotic as within 24h of tPA administration. Plan aspirin 24h after tPA if imaging neg for hemorrhage.   Therapy recommendations:  pending   Disposition:  pending   Remove sheath  Acute Hypoxic and Hypercapnic Respiratory Failure  Intubated for IR, left intubated  Sedated w/ propofol, which is lowering BP. CCM adjusting meds  CCM onboard  Hypertensive Urgency Hypotension in Setting of Sedation  BP 180/120 on arrival  Home meds:  Lisinopril-HCTZ 20-25 daily . BP goal per IR x 24h following IR procedure and tPA administration.  . Now on vasopressors and sedation . CCM on board and helping to adjusting meds. Off home meds . Long-term BP goal normotensive  Dysphagia   . Secondary to stroke . NPO . Speech on board   Other Stroke Risk Factors  Advanced age  Former Cigarette smoker, quit 20 yrs ago  ETOH use, alcohol level <10, advised to drink  no more than 2 drink(s) a day. Started on thiamine and folate.   Other Active Problems  Hx Prostate cancer  Hypokalemia 3.4-3.8-3.7 - supplement  Hypocalcemia 8.3 - supplement  Leukocytosis WBC 13.3  Urinary retention requiring I&O cath  Hospital day # 1  Continue close neurological monitoring and strict blood pressure control as per post interventional protocol.  Discontinue groin sheath and when patient is able to sit up extubation later today.  Check MRI scan.  Aspirin after MRI if no bleed.  Long  discussion with Dr. Marolyn Hammock critical care medicine as well as with the patient's wife and answered questions about Ruben Chavez care. This patient is critically ill and at significant risk of neurological worsening, death and care requires constant monitoring of vital signs, hemodynamics,respiratory and cardiac monitoring, extensive review of multiple databases, frequent neurological assessment, discussion with family, other specialists and medical decision making of high complexity.I have made any additions or clarifications directly to the above note.This critical care time does not reflect procedure time, or teaching time or supervisory time of PA/NP/Med Resident etc but could involve care discussion time.  I spent 35 minutes of neurocritical care time  in the care of  this patient.   Antony Contras, MD   To contact Stroke Continuity provider, please refer to [email or web address] After hours, contact General Neurology

## 2020-01-25 NOTE — Anesthesia Postprocedure Evaluation (Signed)
Anesthesia Post Note  Patient: Ruben Chavez  Procedure(s) Performed: IR WITH ANESTHESIA (N/A )     Patient location during evaluation: SICU Anesthesia Type: General Level of consciousness: sedated Pain management: pain level controlled Vital Signs Assessment: post-procedure vital signs reviewed and stable Respiratory status: patient remains intubated per anesthesia plan Cardiovascular status: stable Postop Assessment: no apparent nausea or vomiting Anesthetic complications: no    Last Vitals:  Vitals:   01/25/20 0015 01/25/20 0030  BP: 123/78 (!) 85/57  Pulse: 76 65  Resp: 19 16  Temp:    SpO2: 100% 100%    Last Pain:  Vitals:   01/24/20 2107  TempSrc: Oral  PainSc:                  Bari Handshoe S

## 2020-01-25 NOTE — Progress Notes (Signed)
Received page for IR team. Met patient and RN in IR to assist with IR prep. Pt's groins clipped and pulses marked. Pt placed on IR monitor and IVF attached to PIV.

## 2020-01-25 NOTE — Progress Notes (Signed)
SLP Cancellation Note  Patient Details Name: Ruben Chavez MRN: [id number] DOB: 1951/06/30   Cancelled treatment:       Reason Eval/Treat Not Completed: Patient not medically ready (on vent). Will f/u as able.    Osie Bond., M.A. [place] [facility] Pager [phone] Office [phone]  01/25/2020, 8:01 AM

## 2020-01-26 ENCOUNTER — Inpatient Hospital Stay (HOSPITAL_COMMUNITY): Payer: PPO

## 2020-01-26 DIAGNOSIS — I952 Hypotension due to drugs: Secondary | ICD-10-CM

## 2020-01-26 DIAGNOSIS — E876 Hypokalemia: Secondary | ICD-10-CM

## 2020-01-26 DIAGNOSIS — I639 Cerebral infarction, unspecified: Secondary | ICD-10-CM

## 2020-01-26 DIAGNOSIS — R1312 Dysphagia, oropharyngeal phase: Secondary | ICD-10-CM

## 2020-01-26 DIAGNOSIS — J9601 Acute respiratory failure with hypoxia: Secondary | ICD-10-CM

## 2020-01-26 DIAGNOSIS — J9602 Acute respiratory failure with hypercapnia: Secondary | ICD-10-CM

## 2020-01-26 DIAGNOSIS — I63519 Cerebral infarction due to unspecified occlusion or stenosis of unspecified middle cerebral artery: Secondary | ICD-10-CM

## 2020-01-26 LAB — CBC
HCT: 34.3 % — ABNORMAL LOW (ref 39.0–52.0)
Hemoglobin: 11.5 g/dL — ABNORMAL LOW (ref 13.0–17.0)
MCH: 33 pg (ref 26.0–34.0)
MCHC: 33.5 g/dL (ref 30.0–36.0)
MCV: 98.3 fL (ref 80.0–100.0)
Platelets: 167 10*3/uL (ref 150–400)
RBC: 3.49 MIL/uL — ABNORMAL LOW (ref 4.22–5.81)
RDW: 14.6 % (ref 11.5–15.5)
WBC: 9.9 10*3/uL (ref 4.0–10.5)
nRBC: 0 % (ref 0.0–0.2)

## 2020-01-26 LAB — BASIC METABOLIC PANEL
Anion gap: 9 (ref 5–15)
BUN: 12 mg/dL (ref 8–23)
CO2: 21 mmol/L — ABNORMAL LOW (ref 22–32)
Calcium: 8.4 mg/dL — ABNORMAL LOW (ref 8.9–10.3)
Chloride: 109 mmol/L (ref 98–111)
Creatinine, Ser: 0.88 mg/dL (ref 0.61–1.24)
GFR calc Af Amer: >60 mL/min (ref >60)
GFR calc non Af Amer: >60 mL/min (ref >60)
Glucose, Bld: 116 mg/dL — ABNORMAL HIGH (ref 70–99)
Potassium: 4.4 mmol/L (ref 3.5–5.1)
Sodium: 139 mmol/L (ref 135–145)

## 2020-01-26 MED ORDER — THIAMINE HCL 100 MG PO TABS
100.0000 mg | ORAL_TABLET | Freq: Every day | ORAL | Status: DC
  Administered 2020-01-26 – 2020-01-28 (×3): 100 mg via ORAL
  Filled 2020-01-26 (×3): qty 1

## 2020-01-26 MED ORDER — ADULT MULTIVITAMIN LIQUID CH
15.0000 mL | Freq: Every day | ORAL | Status: DC
  Filled 2020-01-26: qty 15

## 2020-01-26 MED ORDER — FOLIC ACID 1 MG PO TABS
1.0000 mg | ORAL_TABLET | Freq: Every day | ORAL | Status: DC
  Administered 2020-01-26 – 2020-01-28 (×3): 1 mg via ORAL
  Filled 2020-01-26 (×3): qty 1

## 2020-01-26 MED ORDER — CLOPIDOGREL BISULFATE 75 MG PO TABS
75.0000 mg | ORAL_TABLET | Freq: Every day | ORAL | Status: DC
  Administered 2020-01-26 – 2020-01-28 (×3): 75 mg via ORAL
  Filled 2020-01-26 (×3): qty 1

## 2020-01-26 MED ORDER — ENOXAPARIN SODIUM 40 MG/0.4ML ~~LOC~~ SOLN
40.0000 mg | SUBCUTANEOUS | Status: DC
  Administered 2020-01-26 – 2020-01-28 (×3): 40 mg via SUBCUTANEOUS
  Filled 2020-01-26 (×3): qty 0.4

## 2020-01-26 MED ORDER — PANTOPRAZOLE SODIUM 40 MG PO TBEC
40.0000 mg | DELAYED_RELEASE_TABLET | Freq: Every day | ORAL | Status: DC
  Administered 2020-01-26 – 2020-01-28 (×3): 40 mg via ORAL
  Filled 2020-01-26 (×3): qty 1

## 2020-01-26 MED ORDER — ADULT MULTIVITAMIN W/MINERALS CH
1.0000 | ORAL_TABLET | Freq: Every day | ORAL | Status: DC
  Administered 2020-01-26 – 2020-01-28 (×3): 1 via ORAL
  Filled 2020-01-26 (×3): qty 1

## 2020-01-26 MED ORDER — ATORVASTATIN CALCIUM 10 MG PO TABS
20.0000 mg | ORAL_TABLET | Freq: Every day | ORAL | Status: DC
  Administered 2020-01-26 – 2020-01-28 (×3): 20 mg via ORAL
  Filled 2020-01-26 (×3): qty 2

## 2020-01-26 MED ORDER — LABETALOL HCL 5 MG/ML IV SOLN: 5.0000 mg | INTRAVENOUS | Status: DC | PRN

## 2020-01-26 NOTE — Progress Notes (Signed)
Referring Physician(s): Lindzen,E  Supervising Physician: [name]  Patient Status:  [facility] - In-pt  Chief Complaint:  Left extremities okay as well as left lower extremity weakness, stroke  Subjective: Patient extubated yesterday, patient conversant, speech slightly slurred/dysarthric,  left facial droop  Allergies: Sulfa antibiotics, Advil [ibuprofen], Aleve [naproxen], Betadine [povidone iodine], Chloroxylenol (antiseptic), Poison ivy extract, and Povidone-iodine  Medications: Prior to Admission medications   Medication Sig Start Date End Date Taking? Authorizing Provider  cephALEXin (KEFLEX) 500 MG capsule Take 2,000 mg by mouth See admin instructions. Take 2,000 mg by mouth one hour prior to dental appointment   Yes [provider]  HYDROcodone-acetaminophen (NORCO/VICODIN) 5-325 MG tablet Take 1 tablet by mouth every 6 (six) hours as needed for moderate pain. 11/16/19  Yes Isaac Bliss, Rayford Halsted, MD  lisinopril-hydrochlorothiazide (ZESTORETIC) 20-25 MG tablet Take 1 tablet by mouth daily. 09/19/19  Yes Isaac Bliss, Rayford Halsted, MD  methocarbamol (ROBAXIN) 500 MG tablet Take 1 tablet (500 mg total) by mouth every 8 (eight) hours as needed for muscle spasms. 11/12/19  Yes Billie Ruddy, MD     Vital Signs: BP (!) 156/68   Pulse 86   Temp 98.3 F (36.8 C) (Oral)   Resp (!) 28   Ht 5\' 9"  (1.753 m)   Wt 180 lb (81.6 kg)   SpO2 99%   BMI 26.58 kg/m   Physical Exam awake, answering questions and following directions appropriately; pupils equal round reactive to light; still weakness noted in left upper extremity; residual left upper extremity weakness; moving right arm and leg okay as well as left leg; groin access site clean and dry, nontender, no hematoma  Imaging: CT Code Stroke CTA Head W/WO contrast  Result Date: 01/24/2020 CLINICAL DATA:  Left-sided weakness.  Neglect. EXAM: CT ANGIOGRAPHY HEAD AND NECK TECHNIQUE: Multidetector CT imaging  of the head and neck was performed using the standard protocol during bolus administration of intravenous contrast. Multiplanar CT image reconstructions and MIPs were obtained to evaluate the vascular anatomy. Carotid stenosis measurements (when applicable) are obtained utilizing NASCET criteria, using the distal internal carotid diameter as the denominator. CONTRAST:  16mL OMNIPAQUE IOHEXOL 350 MG/ML SOLN COMPARISON:  Head CT earlier same day FINDINGS: CTA NECK FINDINGS Aortic arch: Aortic atherosclerotic calcification. No aneurysm or dissection. Right carotid system: Right common carotid artery widely patent to the bifurcation. Calcified plaque at the carotid bifurcation and ICA bulb but no stenosis. Cervical ICA mildly irregular but patent to the skull base. Left carotid system: Common carotid artery widely patent to the bifurcation. Extensive calcified plaque at the carotid bifurcation and ICA bulb but no stenosis. Cervical ICA widely patent to the skull base. Vertebral arteries: Calcified plaque at the right vertebral artery origin with 30% stenosis. Left vertebral artery origin widely patent. Both vertebral arteries widely patent beyond that through the cervical region to the foramen magnum. Skeleton: Ordinary spondylosis. Other neck: No mass or lymphadenopathy. Upper chest: Normal Review of the MIP images confirms the above findings CTA HEAD FINDINGS Anterior circulation: Both internal carotid arteries are patent through the skull base and siphon regions. There is atherosclerotic calcification in the carotid siphon regions but no stenosis greater than 30%. On the left, the anterior and middle cerebral vessels appear normal. On the right, there is embolic occlusion of the superior segment M2 branch. Posterior circulation: Both vertebral arteries are patent through the foramen magnum to the basilar. There is atherosclerotic disease in both V4 segments with stenoses estimated at  30-50%. No basilar stenosis.  Posterior circulation branch vessels are patent. Venous sinuses: Patent and normal. Anatomic variants: None significant. Review of the MIP images confirms the above findings IMPRESSION: Embolic occlusion of the superior division M2 branch on the right. Atherosclerotic disease at both carotid bifurcation regions but no sign of flow limiting stenosis. On the initial CT, I probably underestimated the aspects score. I think there are some early changes higher than the temporal lobe, aspects probably 8 rather than 9. Case discussed with Dr. Cheral Marker during interpretation. Electronically Signed   By: Nelson Chimes M.D.   On: 01/24/2020 20:44   CT Code Stroke CTA Neck W/WO contrast  Result Date: 01/24/2020 CLINICAL DATA:  Left-sided weakness.  Neglect. EXAM: CT ANGIOGRAPHY HEAD AND NECK TECHNIQUE: Multidetector CT imaging of the head and neck was performed using the standard protocol during bolus administration of intravenous contrast. Multiplanar CT image reconstructions and MIPs were obtained to evaluate the vascular anatomy. Carotid stenosis measurements (when applicable) are obtained utilizing NASCET criteria, using the distal internal carotid diameter as the denominator. CONTRAST:  34mL OMNIPAQUE IOHEXOL 350 MG/ML SOLN COMPARISON:  Head CT earlier same day FINDINGS: CTA NECK FINDINGS Aortic arch: Aortic atherosclerotic calcification. No aneurysm or dissection. Right carotid system: Right common carotid artery widely patent to the bifurcation. Calcified plaque at the carotid bifurcation and ICA bulb but no stenosis. Cervical ICA mildly irregular but patent to the skull base. Left carotid system: Common carotid artery widely patent to the bifurcation. Extensive calcified plaque at the carotid bifurcation and ICA bulb but no stenosis. Cervical ICA widely patent to the skull base. Vertebral arteries: Calcified plaque at the right vertebral artery origin with 30% stenosis. Left vertebral artery origin widely patent. Both  vertebral arteries widely patent beyond that through the cervical region to the foramen magnum. Skeleton: Ordinary spondylosis. Other neck: No mass or lymphadenopathy. Upper chest: Normal Review of the MIP images confirms the above findings CTA HEAD FINDINGS Anterior circulation: Both internal carotid arteries are patent through the skull base and siphon regions. There is atherosclerotic calcification in the carotid siphon regions but no stenosis greater than 30%. On the left, the anterior and middle cerebral vessels appear normal. On the right, there is embolic occlusion of the superior segment M2 branch. Posterior circulation: Both vertebral arteries are patent through the foramen magnum to the basilar. There is atherosclerotic disease in both V4 segments with stenoses estimated at 30-50%. No basilar stenosis. Posterior circulation branch vessels are patent. Venous sinuses: Patent and normal. Anatomic variants: None significant. Review of the MIP images confirms the above findings IMPRESSION: Embolic occlusion of the superior division M2 branch on the right. Atherosclerotic disease at both carotid bifurcation regions but no sign of flow limiting stenosis. On the initial CT, I probably underestimated the aspects score. I think there are some early changes higher than the temporal lobe, aspects probably 8 rather than 9. Case discussed with Dr. Cheral Marker during interpretation. Electronically Signed   By: Nelson Chimes M.D.   On: 01/24/2020 20:44   MR BRAIN WO CONTRAST  Result Date: 01/25/2020 CLINICAL DATA:  69 year old male code stroke presentation yesterday, received IV tPA. Right MCA M2 occlusion treated with endovascular thrombectomy. EXAM: MRI HEAD WITHOUT CONTRAST TECHNIQUE: Multiplanar, multiecho pulse sequences of the brain and surrounding structures were obtained without intravenous contrast. COMPARISON:  CT head, CTA and CTP yesterday. FINDINGS: Brain: Patchy, confluent posterior right temporal lobe gray  and white matter restricted diffusion corresponding to that evident by plain CT  yesterday. Superimposed similar confluent infarct in the right operculum (series 7, image 52). Some involvement of the insula. Restricted diffusion here also tracks cephalad along the perirolandic cortex to the level of upper extremity representation. Additional smaller area of right superior frontal gyrus pre motor cortical restriction. Additional focus of right corona radiata involvement. T2 and FLAIR hyperintensity with petechial hemorrhage in the posterior right temporal lobe (Heidelberg Classification 1 A series 12, image 28). No other cerebral blood products identified. No significant mass effect. Small area of what appears to be chronic cortical encephalomalacia in the right parietal lobe on series 9, image 18. Pearline Cables and white matter signal in the left hemisphere and posterior fossa is normal for age. No midline shift, ventriculomegaly, extra-axial collection. Cervicomedullary junction and pituitary are within normal limits. Vascular: Major intracranial vascular flow voids are preserved. Skull and upper cervical spine: Partially visible cervical spine degeneration at C3-C4. Visualized bone marrow signal is within normal limits. Sinuses/Orbits: Negative orbits. Mucosal thickening and small fluid levels in the ethmoid and sphenoid sinuses. Other: Trace mastoid fluid. Visible internal auditory structures appear normal. Incidental benign appearing right occipital scalp lipoma (series 14, image 6). IMPRESSION: 1. Patchy and scattered acute right MCA territory infarct. Heidelberg Classification 1a petechial blood in the right temporal lobe. No significant mass effect. 2. Suggestion of a small area of chronic right parietal lobe cortical encephalomalacia. Negative for age noncontrast MRI appearance of the brain elsewhere. Electronically Signed   By: Genevie Ann M.D.   On: 01/25/2020 21:26   CT CEREBRAL PERFUSION W CONTRAST  Result Date:  01/24/2020 CLINICAL DATA:  Left-sided weakness and neglect. EXAM: CT PERFUSION BRAIN TECHNIQUE: Multiphase CT imaging of the brain was performed following IV bolus contrast injection. Subsequent parametric perfusion maps were calculated using RAPID software. CONTRAST:  56mL OMNIPAQUE IOHEXOL 350 MG/ML SOLN COMPARISON:  CT head and CT angiography earlier same day. FINDINGS: CT Brain Perfusion Findings: CBF (<30%) Volume: 68mL Perfusion (Tmax>6.0s) volume: 125mL Mismatch Volume: 82mL ASPECTS on noncontrast CT Head: 8 at 2030 hours today. Infarct Core: 46 mL demonstrated in the frontoparietal brain. Completed infarction in the right temporal lobe not shown because of luxury perfusion. Infarction Location:Completed infarction in the right temporal lobe with luxury perfusion. New or completed infarction in the right frontoparietal brain. Additional 72 cc of at risk brain in the right MCA territory. IMPRESSION: Early subacute infarction in the right temporal lobe with pseudo normalization. Acute infarction at the right frontoparietal junction, 46 cc volume. Additional 72 cc of at risk brain in the right MCA territory. Results reported to Dr. Cheral Marker by text message at approximately 2055 hours. Electronically Signed   By: Nelson Chimes M.D.   On: 01/24/2020 21:03   ECHOCARDIOGRAM COMPLETE  Result Date: 01/25/2020    ECHOCARDIOGRAM REPORT   Patient Name:   ALOYSIOUS BALAN Date of Exam: 01/25/2020 Medical Rec #:  [id number]         Height:       69.0 in Accession #:    [id number]        Weight:       180.0 lb Date of Birth:  July 02, 1951          BSA:          1.98 m Patient Age:    69 years          BP:           119/53 mmHg Patient Gender: M  HR:           60 bpm. Exam Location:  Inpatient Procedure: 2D Echo Indications:    Stroke 434.91 / I163.9  History:        Patient has no prior history of Echocardiogram examinations.                 Signs/Symptoms:Hypotension; Risk Factors:Hypertension and                  Dyslipidemia.  Sonographer:    Vikki Ports Turrentine Referring Phys: [phone] ERIC LINDZEN  Sonographer Comments: Echo performed with patient supine and on artificial respirator. IMPRESSIONS  1. Left ventricular ejection fraction, by estimation, is 60 to 65%. The left ventricle has normal function. LV endocardial border not optimally defined to evaluate regional wall motion. Left ventricular diastolic parameters are consistent with Grade I diastolic dysfunction (impaired relaxation).  2. Right ventricular systolic function is normal. The right ventricular size is normal. Tricuspid regurgitation signal is inadequate for assessing PA pressure.  3. The mitral valve is grossly normal. No evidence of mitral valve regurgitation. No evidence of mitral stenosis.  4. The aortic valve is tricuspid. Aortic valve regurgitation is not visualized. No aortic stenosis is present. Comparison(s): No prior Echocardiogram. FINDINGS  Left Ventricle: Left ventricular ejection fraction, by estimation, is 60 to 65%. The left ventricle has normal function. LV endocardial border not optimally defined to evaluate regional wall motion. The left ventricular internal cavity size was normal in size. There is no left ventricular hypertrophy. Left ventricular diastolic parameters are consistent with Grade I diastolic dysfunction (impaired relaxation). Right Ventricle: The right ventricular size is normal. No increase in right ventricular wall thickness. Right ventricular systolic function is normal. Tricuspid regurgitation signal is inadequate for assessing PA pressure. Left Atrium: Left atrial size was normal in size. Right Atrium: Right atrial size was normal in size. Pericardium: There is no evidence of pericardial effusion. Mitral Valve: The mitral valve is grossly normal. No evidence of mitral valve regurgitation. No evidence of mitral valve stenosis. Tricuspid Valve: The tricuspid valve is grossly normal. Tricuspid valve regurgitation is not  demonstrated. Aortic Valve: The aortic valve is tricuspid. Aortic valve regurgitation is not visualized. No aortic stenosis is present. Pulmonic Valve: The pulmonic valve was grossly normal. Pulmonic valve regurgitation is not visualized. Aorta: The aortic root is normal in size and structure. Venous: The inferior vena cava was not well visualized. IAS/Shunts: No atrial level shunt detected by color flow Doppler.  LEFT VENTRICLE PLAX 2D LVIDd:         3.79 cm  Diastology LVIDs:         2.87 cm  LV e' lateral:   13.50 cm/s LV PW:         0.96 cm  LV E/e' lateral: 5.1 LV IVS:        1.00 cm  LV e' medial:    9.90 cm/s LVOT diam:     2.10 cm  LV E/e' medial:  6.9 LV SV:         89.71 ml LV SV Index:   15.03 LVOT Area:     3.46 cm  RIGHT VENTRICLE RV S prime:     20.50 cm/s TAPSE (M-mode): 2.1 cm RIGHT ATRIUM           Index RA Area:     15.30 cm RA Volume:   35.00 ml  17.72 ml/m  AORTIC VALVE LVOT Vmax:   118.00 cm/s LVOT Vmean:  90.000 cm/s  LVOT VTI:    0.259 m  AORTA Ao Root diam: 3.60 cm MITRAL VALVE MV Area (PHT): 2.76 cm    SHUNTS MV Decel Time: 275 msec    Systemic VTI:  0.26 m MV E velocity: 68.60 cm/s  Systemic Diam: 2.10 cm MV A velocity: 85.30 cm/s MV E/A ratio:  0.80 Eleonore Chiquito MD Electronically signed by Eleonore Chiquito MD Signature Date/Time: 01/25/2020/4:26:55 PM    Final    CT HEAD CODE STROKE WO CONTRAST  Addendum Date: 01/24/2020   ADDENDUM REPORT: 01/24/2020 20:49 ADDENDUM: I think that the initial interpretation underestimated the disease. Whereas there is a subacute infarction in the temporal lobe, there are probably more acute changes with loss of gray-white differentiation above that in the frontoparietal region, making the aspects 8 rather than 9. Electronically Signed   By: Nelson Chimes M.D.   On: 01/24/2020 20:49   Result Date: 01/24/2020 CLINICAL DATA:  Code stroke.  Left-sided weakness. EXAM: CT HEAD WITHOUT CONTRAST TECHNIQUE: Contiguous axial images were obtained from the base of  the skull through the vertex without intravenous contrast. COMPARISON:  None. FINDINGS: Brain: Subacute infarction in the right lateral temporal lobe. Mild swelling but no hemorrhage. No other acute finding. Elsewhere, there are mild chronic small-vessel changes of the white matter. No mass, hydrocephalus or extra-axial collection. Vascular: There is atherosclerotic calcification of the major vessels at the base of the brain. Skull: Negative Sinuses/Orbits: Clear/normal Other: None ASPECTS (Trail Stroke Program Early CT Score) - Ganglionic level infarction (caudate, lentiform nuclei, internal capsule, insula, M1-M3 cortex): 6 - Supraganglionic infarction (M4-M6 cortex): 3 Total score (0-10 with 10 being normal): 9 IMPRESSION: 1. Subacute infarction in the right lateral temporal lobe. No hemorrhage or mass effect. 2. ASPECTS is 9 3. These results were communicated to Dr. Cheral Marker at [place] 2/18/2021by text page via the [facility] messaging system. Electronically Signed: By: Nelson Chimes M.D. On: 01/24/2020 20:22   VAS [place] LOWER EXTREMITY VENOUS (DVT)  Result Date: 01/26/2020  Lower Venous DVTStudy Indications: Stroke.  Comparison Study: No prior study on file for comparison Performing Technologist: Sharion Dove RVS  Examination Guidelines: A complete evaluation includes B-mode imaging, spectral Doppler, color Doppler, and power Doppler as needed of all accessible portions of each vessel. Bilateral testing is considered an integral part of a complete examination. Limited examinations for reoccurring indications may be performed as noted. The reflux portion of the exam is performed with the patient in reverse Trendelenburg.  +---------+---------------+---------+-----------+----------+--------------+ RIGHT    CompressibilityPhasicitySpontaneityPropertiesThrombus Aging +---------+---------------+---------+-----------+----------+--------------+ CFV      Full           Yes      Yes                                  +---------+---------------+---------+-----------+----------+--------------+ SFJ      Full                                                        +---------+---------------+---------+-----------+----------+--------------+ FV Prox  Full                                                        +---------+---------------+---------+-----------+----------+--------------+  FV Mid   Full                                                        +---------+---------------+---------+-----------+----------+--------------+ FV DistalFull                                                        +---------+---------------+---------+-----------+----------+--------------+ PFV      Full                                                        +---------+---------------+---------+-----------+----------+--------------+ POP      Full           Yes      Yes                                 +---------+---------------+---------+-----------+----------+--------------+ PTV      Full                                                        +---------+---------------+---------+-----------+----------+--------------+ PERO     Full                                                        +---------+---------------+---------+-----------+----------+--------------+   +---------+---------------+---------+-----------+----------+--------------+ LEFT     CompressibilityPhasicitySpontaneityPropertiesThrombus Aging +---------+---------------+---------+-----------+----------+--------------+ CFV      Full           Yes      Yes                                 +---------+---------------+---------+-----------+----------+--------------+ SFJ      Full                                                        +---------+---------------+---------+-----------+----------+--------------+ FV Prox  Full                                                         +---------+---------------+---------+-----------+----------+--------------+ FV Mid   Full                                                        +---------+---------------+---------+-----------+----------+--------------+  FV DistalFull                                                        +---------+---------------+---------+-----------+----------+--------------+ PFV      Full                                                        +---------+---------------+---------+-----------+----------+--------------+ POP      Full           Yes      Yes                                 +---------+---------------+---------+-----------+----------+--------------+ PTV      Full                                                        +---------+---------------+---------+-----------+----------+--------------+ PERO     Full                                                        +---------+---------------+---------+-----------+----------+--------------+     Summary: BILATERAL: - No evidence of deep vein thrombosis seen in the lower extremities, bilaterally.   *See table(s) above for measurements and observations.    Preliminary     Labs:  CBC: Recent Labs    10/09/19 1234 10/09/19 1234 01/24/20 2006 01/24/20 2006 01/24/20 2032 01/25/20 0150 01/25/20 0502 01/26/20 1045  WBC 5.6  --  7.4  --   --  13.3*  --  9.9  HGB 14.8   < > 14.6   < > 15.6 13.9 13.6 11.5*  HCT 43.5   < > 43.2   < > 46.0 40.3 40.0 34.3*  PLT 239.0  --  264  --   --  269  --  167   < > = values in this interval not displayed.    COAGS: Recent Labs    01/24/20 2006  INR 1.0  APTT 27    BMP: Recent Labs    10/09/19 1234 10/09/19 1234 01/24/20 2006 01/24/20 2006 01/24/20 2032 01/25/20 0150 01/25/20 0502 01/26/20 1045  NA 137   < > 139   < > 140 135 137 139  K 4.9   < > 3.5   < > 3.4* 3.8 3.7 4.4  CL 99   < > 104  --  104 103  --  109  CO2 30  --  20*  --   --  20*  --  21*  GLUCOSE 91    < > 103*  --  96 173*  --  116*  BUN 25*   < > 17  --  20 14  --  12  CALCIUM 9.9  --  9.7  --   --  8.3*  --  8.4*  CREATININE 0.91   < > 0.95  --  0.80 0.85  --  0.88  GFRNONAA  --   --  >60  --   --  >60  --  >60  GFRAA  --   --  >60  --   --  >60  --  >60   < > = values in this interval not displayed.    LIVER FUNCTION TESTS: Recent Labs    10/09/19 1234 01/24/20 2006  BILITOT 0.7 1.1  AST 25 50*  ALT 26 55*  ALKPHOS 46 40  PROT 7.3 7.3  ALBUMIN 4.6 4.2    Assessment and Plan: History of acute CVA s/p cerebral arteriogram with emergent mechanical thrombectomy of right MCA inferior division achieving a TICI 2B revascularization 01/24/2020 by Dr. Estanislado Pandy.  Afebrile, creatinine normal, BBC normal, hemoglobin 11.5, MR brain yesterday: 1. Patchy and scattered acute right MCA territory infarct. Heidelberg Classification 1a petechial blood in the right temporal lobe. No significant mass effect.  2. Suggestion of a small area of chronic right parietal lobe cortical encephalomalacia. Negative for age noncontrast MRI appearance of the brain elsewhere  Currently on aspirin, Plavix and Lovenox; continue with physical therapy, additional plans as per neurology.   Electronically Signed: D. Rowe Hjalmer, PA-C 01/26/2020, 4:55 PM   I spent a total of 15 minutes at the the patient's bedside AND on the patient's hospital floor or unit, greater than 50% of which was counseling/coordinating care for cerebral arteriogram with endovascular intervention    Patient ID: [name], male   DOB: 02-28-1951, 69 y.o.   MRN: [id number]

## 2020-01-26 NOTE — Plan of Care (Signed)
Patient tolerating room air.

## 2020-01-26 NOTE — Progress Notes (Signed)
Rehab Admissions Coordinator Note:  Patient was screened by Cleatrice Burke for appropriateness for an Inpatient Acute Rehab Consult.  At this time, we are recommending Inpatient Rehab consult. I iwll place order per protocol.  Cleatrice Burke RN MSN 01/26/2020, 6:51 PM  I can be reached at [phone].

## 2020-01-26 NOTE — Progress Notes (Signed)
After ambulating with PT in room and sitting in chair, patient's BP dropped to 63/38. PT assessed on both arms and found to correlate. Patient appearance pale and diaphoretic, denies chest pain. No change in HR. Reassessment of BP came up to 120/64 within 10 minutes. Dr. Erlinda Hong notified. New orders for TED hose, orthostatic BPs. MD instructed pt still clear to transfer out of ICU and to return to bed.

## 2020-01-26 NOTE — Evaluation (Signed)
Physical Therapy Evaluation Patient Details Name: Ruben Chavez MRN: [id number] DOB: 02/23/1951 Today's Date: 01/26/2020   History of Present Illness  Pt presents with L sided weakness due to R MCA CVA, received tPA and underwent revascularization. PMH: prostate cancer, L THA 20 yrs ago, R THA 2 yrs ago, R knee arthroscopy, R biceps tendon repair.   Clinical Impression  Pt admitted with above diagnosis. Pt presents with poor insight into deficits and significant safety concerns. Though pt tested nearly full strength LLE, demonstrated coordination and proprioceptive deficits during gait, needed mod A +2 with RW. Would benefit from intense rehab environment with all 3 disciplines and is very motivated to return to independence.   Pt currently with functional limitations due to the deficits listed below (see PT Problem List). Pt will benefit from skilled PT to increase their independence and safety with mobility to allow discharge to the venue listed below.       Follow Up Recommendations CIR    Equipment Recommendations  Other (comment)(TBD)    Recommendations for Other Services Rehab consult     Precautions / Restrictions Precautions Precautions: Fall Precaution Comments: poor insight into deficits Restrictions Weight Bearing Restrictions: No      Mobility  Bed Mobility Overal bed mobility: Needs Assistance Bed Mobility: Supine to Sit     Supine to sit: Min assist     General bed mobility comments: Pt able to bring BLEs towards EOB. Min A for elevating trunk  Transfers Overall transfer level: Needs assistance Equipment used: Rolling walker (2 wheeled) Transfers: Sit to/from Stand Sit to Stand: Min assist         General transfer comment: Min A for safety and balance once in standing  Ambulation/Gait Ambulation/Gait assistance: Min assist;+2 safety/equipment Gait Distance (Feet): 20 Feet Assistive device: Rolling walker (2 wheeled) Gait Pattern/deviations:  Step-to pattern Gait velocity: decreased Gait velocity interpretation: <1.31 ft/sec, indicative of household ambulator General Gait Details: pt tapping L foot down on floor several times before putting wt on it or occasionally moving it fwd then bkwd. Easily overstimulated and confused by task.   Stairs            Wheelchair Mobility    Modified Rankin (Stroke Patients Only) Modified Rankin (Stroke Patients Only) Pre-Morbid Rankin Score: No symptoms Modified Rankin: Moderately severe disability     Balance Overall balance assessment: Needs assistance Sitting-balance support: No upper extremity supported;Feet supported Sitting balance-Leahy Scale: Fair     Standing balance support: Bilateral upper extremity supported;During functional activity Standing balance-Leahy Scale: Poor Standing balance comment: needed UE support for safety                             Pertinent Vitals/Pain Pain Assessment: No/denies pain Faces Pain Scale: Hurts little more Pain Location: L hand/ wrist Pain Descriptors / Indicators: Sore Pain Intervention(s): Limited activity within patient's tolerance;Monitored during session    Home Living Family/patient expects to be discharged to:: Private residence Living Arrangements: Spouse/significant other Available Help at Discharge: Family;Available 24 hours/day Type of Home: House Home Access: Stairs to enter   [employer] of Steps: 1 Home Layout: Two level;1/2 bath on main level Home Equipment: Crutches Additional Comments: wife is home with him and she is retired, however, she is scheduled for THA on 02/06/20. Good family support from children though too.     Prior Function Level of Independence: Independent         Comments:  Recently retired in July. ADLs and IADLs.     Hand Dominance   Dominant Hand: Right    Extremity/Trunk Assessment   Upper Extremity Assessment Upper Extremity Assessment: LUE  deficits/detail LUE Coordination: decreased fine motor;decreased gross motor    Lower Extremity Assessment Lower Extremity Assessment: LLE deficits/detail LLE Deficits / Details: pt taps L foot on the floor several times before placing it down to step, denies sensory deficits but appears to have diminished sensation LLE. hip flex 3+/5, knee ext 4+/5, knee flex 4+/5, no L knee buckling LLE Sensation: decreased proprioception LLE Coordination: decreased gross motor;decreased fine motor    Cervical / Trunk Assessment Cervical / Trunk Assessment: Normal  Communication   Communication: Other (comment)(slurred speech)  Cognition Arousal/Alertness: Awake/alert Behavior During Therapy: Flat affect Overall Cognitive Status: Impaired/Different from baseline Area of Impairment: Attention;Memory;Following commands;Safety/judgement;Awareness;Problem solving                   Current Attention Level: Focused Memory: Decreased short-term memory;Decreased recall of precautions Following Commands: Follows one step commands inconsistently Safety/Judgement: Decreased awareness of safety;Decreased awareness of deficits Awareness: Intellectual Problem Solving: Slow processing;Decreased initiation;Difficulty sequencing;Requires verbal cues;Requires tactile cues General Comments: pt highly distractable, especially when wife present and multiple people speaking      General Comments General comments (skin integrity, edema, etc.): BP sup before session 177/74. BP sitting after ambulation 74/47 with feet up. Similar reading on both UE's. After 10 mins BP returned to 120/64 with LE's up and pt reclined. Pt denied dizziness but was diaphoretic and pale. HR 60s-70s    Exercises General Exercises - Lower Extremity Ankle Circles/Pumps: AROM;Both;10 reps;Seated   Assessment/Plan    PT Assessment Patient needs continued PT services  PT Problem List Decreased strength;Decreased range of motion;Decreased  activity tolerance;Decreased balance;Decreased mobility;Decreased coordination;Decreased cognition;Decreased knowledge of use of DME;Decreased safety awareness;Decreased knowledge of precautions;Cardiopulmonary status limiting activity;Impaired sensation;Impaired tone;Pain       PT Treatment Interventions DME instruction;Gait training;Stair training;Functional mobility training;Therapeutic activities;Therapeutic exercise;Balance training;Neuromuscular re-education;Cognitive remediation;Patient/family education    PT Goals (Current goals can be found in the Care Plan section)  Acute Rehab PT Goals Patient Stated Goal: return home PT Goal Formulation: With patient/family Time For Goal Achievement: 02/09/20 Potential to Achieve Goals: Good    Frequency Min 4X/week   Barriers to discharge        Co-evaluation PT/OT/SLP Co-Evaluation/Treatment: Yes Reason for Co-Treatment: For patient/therapist safety;To address functional/ADL transfers PT goals addressed during session: Mobility/safety with mobility;Balance;Proper use of DME;Strengthening/ROM OT goals addressed during session: ADL's and self-care       AM-PAC PT "6 Clicks" Mobility  Outcome Measure Help needed turning from your back to your side while in a flat bed without using bedrails?: A Little Help needed moving from lying on your back to sitting on the side of a flat bed without using bedrails?: A Little Help needed moving to and from a bed to a chair (including a wheelchair)?: A Little Help needed standing up from a chair using your arms (e.g., wheelchair or bedside chair)?: A Little Help needed to walk in hospital room?: A Lot Help needed climbing 3-5 steps with a railing? : Total 6 Click Score: 15    End of Session Equipment Utilized During Treatment: Gait belt Activity Tolerance: Treatment limited secondary to medical complications (Comment)(hypotension after ambulation) Patient left: in chair;with call bell/phone within  reach;with family/visitor present;with chair alarm set Nurse Communication: Mobility status PT Visit Diagnosis: Unsteadiness on feet (R26.81);Hemiplegia and hemiparesis;Difficulty in walking,  not elsewhere classified (R26.2) Hemiplegia - Right/Left: Left Hemiplegia - dominant/non-dominant: Non-dominant Hemiplegia - caused by: Cerebral infarction    Time: 1133-1207 PT Time Calculation (min) (ACUTE ONLY): 34 min   Charges:   PT Evaluation $PT Eval Moderate Complexity: [place]  Pager [phone] Office [place] 01/26/2020, 4:43 PM

## 2020-01-26 NOTE — Evaluation (Signed)
Occupational Therapy Evaluation Patient Details Name: Ruben Chavez MRN: [id number] DOB: 12-27-1950 Today's Date: 01/26/2020    History of Present Illness Pt presents with L sided weakness due to R MCA CVA, received tPA and underwent revascularization. PMH: prostate cancer, L THA 20 yrs ago, R THA 2 yrs ago, R knee arthroscopy, R biceps tendon repair.    Clinical Impression   PTA, pt was living with his wife and was independent. Pt currently requiring Mod A for UB ADLs, Mod-Max A for LB ADLs, and Min A for functional transfer with RW. Pt presenting with poor vision, cognition, functional use of LUE, balance, strength, and safety. Pt highly motivated to participate in therapy and has good family support. Pt would benefit from further acute OT to facilitate safe dc. Recommend dc to CIR for intensive OT to optimize safety, independence with ADLs, and return to PLOF.      Follow Up Recommendations  CIR;Supervision/Assistance - 24 hour    Equipment Recommendations  Other (comment)(Defer to next venue)    Recommendations for Other Services PT consult;Rehab consult;Speech consult     Precautions / Restrictions Precautions Precautions: Fall Precaution Comments: poor insight into deficits Restrictions Weight Bearing Restrictions: No      Mobility Bed Mobility Overal bed mobility: Needs Assistance Bed Mobility: Supine to Sit     Supine to sit: Min assist     General bed mobility comments: Pt able to bring BLEs towards EOB. Min A for elevating trunk  Transfers Overall transfer level: Needs assistance Equipment used: Rolling walker (2 wheeled) Transfers: Sit to/from Stand Sit to Stand: Min assist         General transfer comment: Min A for safety and balance once in standing    Balance Overall balance assessment: Needs assistance Sitting-balance support: No upper extremity supported;Feet supported Sitting balance-Leahy Scale: Fair     Standing balance support:  Bilateral upper extremity supported;During functional activity Standing balance-Leahy Scale: Poor Standing balance comment: needed UE support for safety                           ADL either performed or assessed with clinical judgement   ADL Overall ADL's : Needs assistance/impaired Eating/Feeding: Minimal assistance;Sitting Eating/Feeding Details (indicate cue type and reason): bilateral tasks Grooming: Moderate assistance;Sitting Grooming Details (indicate cue type and reason): Mod A for bilateral tasks Upper Body Bathing: Moderate assistance;Sitting   Lower Body Bathing: Moderate assistance;Sit to/from stand   Upper Body Dressing : Moderate assistance;Sitting   Lower Body Dressing: Maximal assistance;Sit to/from stand Lower Body Dressing Details (indicate cue type and reason): Max A for donning socks Toilet Transfer: Minimal assistance;+2 for safety/equipment;Ambulation;RW(simulated to recliner) Toilet Transfer Details (indicate cue type and reason): Min A for balance nad RW management.          Functional mobility during ADLs: Minimal assistance;+2 for safety/equipment;Rolling walker General ADL Comments: Pt presenting with decreased cognition, balance, strength, functional use of LUE, and safety.     Vision Baseline Vision/History: Wears glasses Wears Glasses: Reading only Patient Visual Report: No change from baseline Vision Assessment?: Yes Eye Alignment: Within Functional Limits Ocular Range of Motion: Within Functional Limits Alignment/Gaze Preference: Head turned;Gaze right Tracking/Visual Pursuits: Decreased smoothness of horizontal tracking;Decreased smoothness of vertical tracking;Requires cues, head turns, or add eye shifts to track;Unable to hold eye position out of midline Convergence: Within functional limits Additional Comments: Decreased smooth tracking (worse to right). Highly distracted during assessment. Right head turn and  gaze preference.      Perception     Praxis      Pertinent Vitals/Pain Pain Assessment: Faces Faces Pain Scale: Hurts little more Pain Location: L hand/ wrist Pain Descriptors / Indicators: Sore Pain Intervention(s): Limited activity within patient's tolerance;Monitored during session     Hand Dominance Right   Extremity/Trunk Assessment Upper Extremity Assessment Upper Extremity Assessment: LUE deficits/detail LUE Deficits / Details: increased edema. Noting slight flexion tone at elbow and shoulder. No active movements at hand, wrist, and elbow. Pt with compensatory hiking of shoulder LUE Sensation: decreased light touch;decreased proprioception(Poor deep touch) LUE Coordination: decreased fine motor;decreased gross motor   Lower Extremity Assessment Lower Extremity Assessment: Defer to PT evaluation LLE Deficits / Details: pt taps L foot on the floor several times before placing it down to step, denies sensory deficits but appears to have diminished sensation LLE. hip flex 3+/5, knee ext 4+/5, knee flex 4+/5, no L knee buckling LLE Sensation: decreased proprioception LLE Coordination: decreased gross motor;decreased fine motor   Cervical / Trunk Assessment Cervical / Trunk Assessment: Normal   Communication Communication Communication: Other (comment)(slurred speech)   Cognition Arousal/Alertness: Awake/alert Behavior During Therapy: Flat affect Overall Cognitive Status: Impaired/Different from baseline Area of Impairment: Attention;Memory;Following commands;Safety/judgement;Awareness;Problem solving                   Current Attention Level: Focused;Sustained Memory: Decreased short-term memory;Decreased recall of precautions Following Commands: Follows one step commands inconsistently Safety/Judgement: Decreased awareness of safety;Decreased awareness of deficits Awareness: Intellectual Problem Solving: Slow processing;Decreased initiation;Difficulty sequencing;Requires verbal  cues;Requires tactile cues General Comments: pt highly distractable, especially when wife present and multiple people speaking. Pt benefiting from calm, direct cues.   General Comments  BP supine 171/74. BP after mobility while sitting in recliner 74/47. After several minutes with BLEs elevated and reclined 120/64.  HR 60-70s. SpO2 90s.     Exercises Exercises: General Lower Extremity General Exercises - Lower Extremity Ankle Circles/Pumps: AROM;Both;10 reps;Seated   Shoulder Instructions      Home Living Family/patient expects to be discharged to:: Private residence Living Arrangements: Spouse/significant other Available Help at Discharge: Family;Available 24 hours/day Type of Home: House Home Access: Stairs to enter [employer] of Steps: 1   Home Layout: Two level;1/2 bath on main level Alternate Level Stairs-Number of Steps: 14 Alternate Level Stairs-Rails: Right;Left Bathroom Shower/Tub: Occupational psychologist: Standard     Home Equipment: Crutches   Additional Comments: wife is home with him and she is retired, however, she is scheduled for THA on 02/06/20. Good family support from children though too.       Prior Functioning/Environment Level of Independence: Independent        Comments: Recently retired in July. ADLs and IADLs.        OT Problem List: Decreased strength;Decreased range of motion;Decreased activity tolerance;Impaired balance (sitting and/or standing);Impaired vision/perception;Decreased coordination;Decreased cognition;Decreased safety awareness;Decreased knowledge of use of DME or AE;Decreased knowledge of precautions;Impaired UE functional use      OT Treatment/Interventions: Self-care/ADL training;Therapeutic exercise;Energy conservation;DME and/or AE instruction;Therapeutic activities;Patient/family education    OT Goals(Current goals can be found in the care plan section) Acute Rehab OT Goals Patient Stated Goal: Return  to home OT Goal Formulation: With patient/family Time For Goal Achievement: 02/09/20 Potential to Achieve Goals: Good  OT Frequency: Min 2X/week   Barriers to D/C:            Co-evaluation PT/OT/SLP Co-Evaluation/Treatment: Yes Reason for Co-Treatment: For patient/therapist  safety;To address functional/ADL transfers PT goals addressed during session: Mobility/safety with mobility;Balance;Proper use of DME;Strengthening/ROM OT goals addressed during session: ADL's and self-care      AM-PAC OT "6 Clicks" Daily Activity     Outcome Measure Help from another person eating meals?: A Little Help from another person taking care of personal grooming?: A Lot Help from another person toileting, which includes using toliet, bedpan, or urinal?: A Lot Help from another person bathing (including washing, rinsing, drying)?: A Lot Help from another person to put on and taking off regular upper body clothing?: A Lot Help from another person to put on and taking off regular lower body clothing?: A Lot 6 Click Score: 13   End of Session Equipment Utilized During Treatment: Gait belt;Rolling walker Nurse Communication: Mobility status;Other (comment)(BP)  Activity Tolerance: Patient tolerated treatment well Patient left: with call bell/phone within reach;in chair;with chair alarm set;with nursing/sitter in room;with family/visitor present  OT Visit Diagnosis: Unsteadiness on feet (R26.81);Other abnormalities of gait and mobility (R26.89);Muscle weakness (generalized) (M62.81);Other symptoms and signs involving cognitive function;Hemiplegia and hemiparesis Hemiplegia - Right/Left: Left Hemiplegia - dominant/non-dominant: Non-Dominant Hemiplegia - caused by: Cerebral infarction                Time: 1130-1204 OT Time Calculation (min): 34 min Charges:  OT General Charges $OT Visit: 1 Visit OT Evaluation $OT Eval Moderate Complexity: 1 Mod  Jailah Willis MSOT, OTR/L Acute Rehab Pager:  [phone] Office: [place] 01/26/2020, 5:20 PM

## 2020-01-26 NOTE — Progress Notes (Addendum)
STROKE TEAM PROGRESS NOTE   INTERVAL HISTORY RN at bedside. Pt awake alert stated that he felt his left arm not able to hold up this morning as he could hold it up and move fingers yesterday. However, by asking RN and looking at Dr. Clydene Fake note, pt seems has left UE plegic and no change since yesterday. MRI showed right MCA patchy infarcts, no hemorrhage. Pt LLE seems stronger than yesterday. He required levophed for BP but has been off. On diet.   Vitals:   01/26/20 0300 01/26/20 0400 01/26/20 0500 01/26/20 0600  BP: (!) 131/58 (!) 120/57 (!) 101/53 (!) 110/54  Pulse: 69 67 67 62  Resp: _0 Temp:  98.4 F (36.9 C)    TempSrc:  Oral    SpO2: 95% 99% 99% 100%  Weight:      Height:        CBC:  Recent Labs  Lab 01/24/20 2006 01/24/20 2032 01/25/20 0150 01/25/20 0502  WBC 7.4  --  13.3*  --   NEUTROABS 4.7  --  11.2*  --   HGB 14.6   < > 13.9 13.6  HCT 43.2   < > 40.3 40.0  MCV 94.7  --  93.9  --   PLT 264  --  269  --    < > = values in this interval not displayed.    Basic Metabolic Panel:  Recent Labs  Lab 01/24/20 2006 01/24/20 2006 01/24/20 2032 01/24/20 2032 01/25/20 0150 01/25/20 0502  NA 139   < > 140   < > 135 137  K 3.5   < > 3.4*   < > 3.8 3.7  CL 104   < > 104  --  103  --   CO2 20*  --   --   --  20*  --   GLUCOSE 103*   < > 96  --  173*  --   BUN 17   < > 20  --  14  --   CREATININE 0.95   < > 0.80  --  0.85  --   CALCIUM 9.7  --   --   --  8.3*  --    < > = values in this interval not displayed.   Lipid Panel:     Component Value Date/Time   CHOL 117 01/25/2020 0150   TRIG 135 01/25/2020 0150   HDL 36 (L) 01/25/2020 0150   CHOLHDL 3.3 01/25/2020 0150   VLDL 27 01/25/2020 0150   LDLCALC 54 01/25/2020 0150   HgbA1c:  Lab Results  Component Value Date   HGBA1C 5.0 01/25/2020   Urine Drug Screen: No results found for: LABOPIA, COCAINSCRNUR, LABBENZ, AMPHETMU, THCU, LABBARB  Alcohol Level     Component Value Date/Time   ETH <10  01/24/2020 2006    IMAGING past 48 hours CT Code Stroke CTA Head W/WO contrast  Result Date: 01/24/2020 CLINICAL DATA:  Left-sided weakness.  Neglect. EXAM: CT ANGIOGRAPHY HEAD AND NECK TECHNIQUE: Multidetector CT imaging of the head and neck was performed using the standard protocol during bolus administration of intravenous contrast. Multiplanar CT image reconstructions and MIPs were obtained to evaluate the vascular anatomy. Carotid stenosis measurements (when applicable) are obtained utilizing NASCET criteria, using the distal internal carotid diameter as the denominator. CONTRAST:  33m OMNIPAQUE IOHEXOL 350 MG/ML SOLN COMPARISON:  Head CT earlier same day FINDINGS: CTA NECK FINDINGS Aortic arch: Aortic atherosclerotic calcification. No aneurysm or dissection. Right  carotid system: Right common carotid artery widely patent to the bifurcation. Calcified plaque at the carotid bifurcation and ICA bulb but no stenosis. Cervical ICA mildly irregular but patent to the skull base. Left carotid system: Common carotid artery widely patent to the bifurcation. Extensive calcified plaque at the carotid bifurcation and ICA bulb but no stenosis. Cervical ICA widely patent to the skull base. Vertebral arteries: Calcified plaque at the right vertebral artery origin with 30% stenosis. Left vertebral artery origin widely patent. Both vertebral arteries widely patent beyond that through the cervical region to the foramen magnum. Skeleton: Ordinary spondylosis. Other neck: No mass or lymphadenopathy. Upper chest: Normal Review of the MIP images confirms the above findings CTA HEAD FINDINGS Anterior circulation: Both internal carotid arteries are patent through the skull base and siphon regions. There is atherosclerotic calcification in the carotid siphon regions but no stenosis greater than 30%. On the left, the anterior and middle cerebral vessels appear normal. On the right, there is embolic occlusion of the superior  segment M2 branch. Posterior circulation: Both vertebral arteries are patent through the foramen magnum to the basilar. There is atherosclerotic disease in both V4 segments with stenoses estimated at 30-50%. No basilar stenosis. Posterior circulation branch vessels are patent. Venous sinuses: Patent and normal. Anatomic variants: None significant. Review of the MIP images confirms the above findings IMPRESSION: Embolic occlusion of the superior division M2 branch on the right. Atherosclerotic disease at both carotid bifurcation regions but no sign of flow limiting stenosis. On the initial CT, I probably underestimated the aspects score. I think there are some early changes higher than the temporal lobe, aspects probably 8 rather than 9. Case discussed with Dr. Cheral Marker during interpretation. Electronically Signed   By: Nelson Chimes M.D.   On: 01/24/2020 20:44   CT Code Stroke CTA Neck W/WO contrast  Result Date: 01/24/2020 CLINICAL DATA:  Left-sided weakness.  Neglect. EXAM: CT ANGIOGRAPHY HEAD AND NECK TECHNIQUE: Multidetector CT imaging of the head and neck was performed using the standard protocol during bolus administration of intravenous contrast. Multiplanar CT image reconstructions and MIPs were obtained to evaluate the vascular anatomy. Carotid stenosis measurements (when applicable) are obtained utilizing NASCET criteria, using the distal internal carotid diameter as the denominator. CONTRAST:  17m OMNIPAQUE IOHEXOL 350 MG/ML SOLN COMPARISON:  Head CT earlier same day FINDINGS: CTA NECK FINDINGS Aortic arch: Aortic atherosclerotic calcification. No aneurysm or dissection. Right carotid system: Right common carotid artery widely patent to the bifurcation. Calcified plaque at the carotid bifurcation and ICA bulb but no stenosis. Cervical ICA mildly irregular but patent to the skull base. Left carotid system: Common carotid artery widely patent to the bifurcation. Extensive calcified plaque at the carotid  bifurcation and ICA bulb but no stenosis. Cervical ICA widely patent to the skull base. Vertebral arteries: Calcified plaque at the right vertebral artery origin with 30% stenosis. Left vertebral artery origin widely patent. Both vertebral arteries widely patent beyond that through the cervical region to the foramen magnum. Skeleton: Ordinary spondylosis. Other neck: No mass or lymphadenopathy. Upper chest: Normal Review of the MIP images confirms the above findings CTA HEAD FINDINGS Anterior circulation: Both internal carotid arteries are patent through the skull base and siphon regions. There is atherosclerotic calcification in the carotid siphon regions but no stenosis greater than 30%. On the left, the anterior and middle cerebral vessels appear normal. On the right, there is embolic occlusion of the superior segment M2 branch. Posterior circulation: Both vertebral arteries are patent  through the foramen magnum to the basilar. There is atherosclerotic disease in both V4 segments with stenoses estimated at 30-50%. No basilar stenosis. Posterior circulation branch vessels are patent. Venous sinuses: Patent and normal. Anatomic variants: None significant. Review of the MIP images confirms the above findings IMPRESSION: Embolic occlusion of the superior division M2 branch on the right. Atherosclerotic disease at both carotid bifurcation regions but no sign of flow limiting stenosis. On the initial CT, I probably underestimated the aspects score. I think there are some early changes higher than the temporal lobe, aspects probably 8 rather than 9. Case discussed with Dr. Cheral Marker during interpretation. Electronically Signed   By: Nelson Chimes M.D.   On: 01/24/2020 20:44   MR BRAIN WO CONTRAST  Result Date: 01/25/2020 CLINICAL DATA:  69 year old male code stroke presentation yesterday, received IV tPA. Right MCA M2 occlusion treated with endovascular thrombectomy. EXAM: MRI HEAD WITHOUT CONTRAST TECHNIQUE:  Multiplanar, multiecho pulse sequences of the brain and surrounding structures were obtained without intravenous contrast. COMPARISON:  CT head, CTA and CTP yesterday. FINDINGS: Brain: Patchy, confluent posterior right temporal lobe gray and white matter restricted diffusion corresponding to that evident by plain CT yesterday. Superimposed similar confluent infarct in the right operculum (series 7, image 52). Some involvement of the insula. Restricted diffusion here also tracks cephalad along the perirolandic cortex to the level of upper extremity representation. Additional smaller area of right superior frontal gyrus pre motor cortical restriction. Additional focus of right corona radiata involvement. T2 and FLAIR hyperintensity with petechial hemorrhage in the posterior right temporal lobe (Heidelberg Classification 1 A series 12, image 28). No other cerebral blood products identified. No significant mass effect. Small area of what appears to be chronic cortical encephalomalacia in the right parietal lobe on series 9, image 18. Pearline Cables and white matter signal in the left hemisphere and posterior fossa is normal for age. No midline shift, ventriculomegaly, extra-axial collection. Cervicomedullary junction and pituitary are within normal limits. Vascular: Major intracranial vascular flow voids are preserved. Skull and upper cervical spine: Partially visible cervical spine degeneration at C3-C4. Visualized bone marrow signal is within normal limits. Sinuses/Orbits: Negative orbits. Mucosal thickening and small fluid levels in the ethmoid and sphenoid sinuses. Other: Trace mastoid fluid. Visible internal auditory structures appear normal. Incidental benign appearing right occipital scalp lipoma (series 14, image 6). IMPRESSION: 1. Patchy and scattered acute right MCA territory infarct. Heidelberg Classification 1a petechial blood in the right temporal lobe. No significant mass effect. 2. Suggestion of a small area of  chronic right parietal lobe cortical encephalomalacia. Negative for age noncontrast MRI appearance of the brain elsewhere. Electronically Signed   By: Genevie Ann M.D.   On: 01/25/2020 21:26   CT CEREBRAL PERFUSION W CONTRAST  Result Date: 01/24/2020 CLINICAL DATA:  Left-sided weakness and neglect. EXAM: CT PERFUSION BRAIN TECHNIQUE: Multiphase CT imaging of the brain was performed following IV bolus contrast injection. Subsequent parametric perfusion maps were calculated using RAPID software. CONTRAST:  45m OMNIPAQUE IOHEXOL 350 MG/ML SOLN COMPARISON:  CT head and CT angiography earlier same day. FINDINGS: CT Brain Perfusion Findings: CBF (<30%) Volume: 436mPerfusion (Tmax>6.0s) volume: 11846mismatch Volume: 100m13mPECTS on noncontrast CT Head: 8 at 2030 hours today. Infarct Core: 46 mL demonstrated in the frontoparietal brain. Completed infarction in the right temporal lobe not shown because of luxury perfusion. Infarction Location:Completed infarction in the right temporal lobe with luxury perfusion. New or completed infarction in the right frontoparietal brain. Additional 72 cc of at  risk brain in the right MCA territory. IMPRESSION: Early subacute infarction in the right temporal lobe with pseudo normalization. Acute infarction at the right frontoparietal junction, 46 cc volume. Additional 72 cc of at risk brain in the right MCA territory. Results reported to Dr. Cheral Marker by text message at approximately 2055 hours. Electronically Signed   By: Nelson Chimes M.D.   On: 01/24/2020 21:03   ECHOCARDIOGRAM COMPLETE  Result Date: 01/25/2020    ECHOCARDIOGRAM REPORT   Patient Name:   Ruben Chavez Date of Exam: 01/25/2020 Medical Rec #:  [id number]         Height:       69.0 in Accession #:    [id number]        Weight:       180.0 lb Date of Birth:  January 20, 1951          BSA:          1.98 m Patient Age:    19 years          BP:           119/53 mmHg Patient Gender: M                 HR:           60 bpm. Exam  Location:  Inpatient Procedure: 2D Echo Indications:    Stroke 434.91 / I163.9  History:        Patient has no prior history of Echocardiogram examinations.                 Signs/Symptoms:Hypotension; Risk Factors:Hypertension and                 Dyslipidemia.  Sonographer:    Vikki Ports Turrentine Referring Phys: [phone] ERIC LINDZEN  Sonographer Comments: Echo performed with patient supine and on artificial respirator. IMPRESSIONS  1. Left ventricular ejection fraction, by estimation, is 60 to 65%. The left ventricle has normal function. LV endocardial border not optimally defined to evaluate regional wall motion. Left ventricular diastolic parameters are consistent with Grade I diastolic dysfunction (impaired relaxation).  2. Right ventricular systolic function is normal. The right ventricular size is normal. Tricuspid regurgitation signal is inadequate for assessing PA pressure.  3. The mitral valve is grossly normal. No evidence of mitral valve regurgitation. No evidence of mitral stenosis.  4. The aortic valve is tricuspid. Aortic valve regurgitation is not visualized. No aortic stenosis is present. Comparison(s): No prior Echocardiogram. FINDINGS  Left Ventricle: Left ventricular ejection fraction, by estimation, is 60 to 65%. The left ventricle has normal function. LV endocardial border not optimally defined to evaluate regional wall motion. The left ventricular internal cavity size was normal in size. There is no left ventricular hypertrophy. Left ventricular diastolic parameters are consistent with Grade I diastolic dysfunction (impaired relaxation). Right Ventricle: The right ventricular size is normal. No increase in right ventricular wall thickness. Right ventricular systolic function is normal. Tricuspid regurgitation signal is inadequate for assessing PA pressure. Left Atrium: Left atrial size was normal in size. Right Atrium: Right atrial size was normal in size. Pericardium: There is no evidence of  pericardial effusion. Mitral Valve: The mitral valve is grossly normal. No evidence of mitral valve regurgitation. No evidence of mitral valve stenosis. Tricuspid Valve: The tricuspid valve is grossly normal. Tricuspid valve regurgitation is not demonstrated. Aortic Valve: The aortic valve is tricuspid. Aortic valve regurgitation is not visualized. No aortic stenosis is present. Pulmonic Valve: The pulmonic valve was  grossly normal. Pulmonic valve regurgitation is not visualized. Aorta: The aortic root is normal in size and structure. Venous: The inferior vena cava was not well visualized. IAS/Shunts: No atrial level shunt detected by color flow Doppler.  LEFT VENTRICLE PLAX 2D LVIDd:         3.79 cm  Diastology LVIDs:         2.87 cm  LV e' lateral:   13.50 cm/s LV PW:         0.96 cm  LV E/e' lateral: 5.1 LV IVS:        1.00 cm  LV e' medial:    9.90 cm/s LVOT diam:     2.10 cm  LV E/e' medial:  6.9 LV SV:         89.71 ml LV SV Index:   15.03 LVOT Area:     3.46 cm  RIGHT VENTRICLE RV S prime:     20.50 cm/s TAPSE (M-mode): 2.1 cm RIGHT ATRIUM           Index RA Area:     15.30 cm RA Volume:   35.00 ml  17.72 ml/m  AORTIC VALVE LVOT Vmax:   118.00 cm/s LVOT Vmean:  90.000 cm/s LVOT VTI:    0.259 m  AORTA Ao Root diam: 3.60 cm MITRAL VALVE MV Area (PHT): 2.76 cm    SHUNTS MV Decel Time: 275 msec    Systemic VTI:  0.26 m MV E velocity: 68.60 cm/s  Systemic Diam: 2.10 cm MV A velocity: 85.30 cm/s MV E/A ratio:  0.80 Eleonore Chiquito MD Electronically signed by Eleonore Chiquito MD Signature Date/Time: 01/25/2020/4:26:55 PM    Final    CT HEAD CODE STROKE WO CONTRAST  Addendum Date: 01/24/2020   ADDENDUM REPORT: 01/24/2020 20:49 ADDENDUM: I think that the initial interpretation underestimated the disease. Whereas there is a subacute infarction in the temporal lobe, there are probably more acute changes with loss of gray-white differentiation above that in the frontoparietal region, making the aspects 8 rather than 9.  Electronically Signed   By: Nelson Chimes M.D.   On: 01/24/2020 20:49   Result Date: 01/24/2020 CLINICAL DATA:  Code stroke.  Left-sided weakness. EXAM: CT HEAD WITHOUT CONTRAST TECHNIQUE: Contiguous axial images were obtained from the base of the skull through the vertex without intravenous contrast. COMPARISON:  None. FINDINGS: Brain: Subacute infarction in the right lateral temporal lobe. Mild swelling but no hemorrhage. No other acute finding. Elsewhere, there are mild chronic small-vessel changes of the white matter. No mass, hydrocephalus or extra-axial collection. Vascular: There is atherosclerotic calcification of the major vessels at the base of the brain. Skull: Negative Sinuses/Orbits: Clear/normal Other: None ASPECTS ([place] Stroke Program Early CT Score) - Ganglionic level infarction (caudate, lentiform nuclei, internal capsule, insula, M1-M3 cortex): 6 - Supraganglionic infarction (M4-M6 cortex): 3 Total score (0-10 with 10 being normal): 9 IMPRESSION: 1. Subacute infarction in the right lateral temporal lobe. No hemorrhage or mass effect. 2. ASPECTS is 9 3. These results were communicated to Dr. Cheral Marker at [facility] 2/18/2021by text page via the [facility] messaging system. Electronically Signed: By: Nelson Chimes M.D. On: 01/24/2020 20:22   Cerebral Angio 2/18/20121 S/P RT common carotid arteriogram followed by revascularization of occluded inf division of Rt MCA with x2 passes with 71m x 40 mm solitaireX retriever and x 1 pass with 334mx 32 mm Trevoprovue retriever and penumbra aspiration achieving a TICI 2b revascularization.  PHYSICAL EXAM  Temp:  [97.7 F (36.5  C)-98.5 F (36.9 C)] 98.4 F (36.9 C) (02/20 0400) Pulse Rate:  [53-106] 64 (02/20 0700) Resp:  [8-36] 11 (02/20 0700) BP: (80-160)/(45-94) 126/61 (02/20 0700) SpO2:  [95 %-100 %] 100 % (02/20 0700) Arterial Line BP: (112-164)/(43-69) 137/69 (02/19 1045) FiO2 (%):  [40 %] 40 % (02/19 1440)  General - Well nourished, well  developed, in no apparent distress.  Ophthalmologic - fundi not visualized due to noncooperation.  Cardiovascular - Regular rhythm and rate.  Mental Status -  Level of arousal and orientation to time, place, and person were intact. Language including expression, naming, repetition, comprehension was assessed and found intact. Mild dysarthria Fund of Knowledge was assessed and was intact.  Cranial Nerves II - XII - II - Visual field intact OU. III, IV, VI - Extraocular movements intact. V - Facial sensation intact bilaterally. VII - left facial droop. VIII - Hearing & vestibular intact bilaterally. X - Palate elevates symmetrically. XI - Chin turning & shoulder shrug intact bilaterally. XII - Tongue protrusion intact.  Motor Strength - The patient's strength was normal in RUE and RLE, however, LUE plegic and LLE 4+/5 proximal and distal.  Bulk was normal and fasciculations were absent.   Motor Tone - Muscle tone was assessed at the neck and appendages and was normal.  Reflexes - The patient's reflexes were symmetrical in all extremities except LUE diminished reflex and tone and he had no pathological reflexes.  Sensory - Light touch, temperature/pinprick were assessed and were near diminished on the LUE and LLE    Coordination - The patient had normal movements in the right hand with no ataxia or dysmetria.  Tremor was absent.  Gait and Station - deferred.   ASSESSMENT/PLAN [name] is a 69 y.o. male with history of HTN, HLD, prostate cancer presenting with L sided weakness, L hemisensory loss, L facial droop, L field cut, dysarthria and anosognosia. Received tPA 01/24/2020 at 2024. Taken to IR for R MCA occlusion.   Stroke:   [name]  CT head subacute R lateral temporal lobe and temporoparietal area infarcts.     CTA head & neck R M2 superior division  occlusion. B ICA bifurcation atherosclerosis.   CT perfusion pseudonormalization R temporal lobe. R frontoparietal jxn infarct 46cc, R MCA 72cc.  Cerebral angio TICI2b revascularization R MCA  MRI - Patchy and scattered acute right MCA territory infarct. Petechial blood in the right temporal lobe.  2D Echo - EF 60 - 65%. No cardiac source of emboli identified.   LE venous Doppler pending  Consider loop placement at d/c if source not found  LDL 54 - no statin needed given goal LDL  HgbA1c 5.0  UDS pending  Lovenox for VTE prophylaxis  No antithrombotic prior to admission, now on ASA 81 mg and Plavix 75 daily.  Therapy recommendations:  pending   Disposition:  pending   Hypertensive urgency Hypotension in setting of sedation  BP 180/120 on arrival  Home meds:  Lisinopril-HCTZ 20-25 daily . Was on vasopressors and sedation, now off . BP stable . Long-term BP goal normotensive  Dysphagia   . Secondary to stroke . Now on diet . Speech on board   Other Stroke Risk Factors  Advanced age  Former Cigarette smoker, quit 20 yrs ago  ETOH use, advised to drink no more than 2 drink(s) a day. On thiamine and folate  and MVI.   Other Active Problems  Hx Prostate cancer  Hypokalemia 3.4-3.8-3.7 - supplement  Hypocalcemia 8.3 - supplement  Leukocytosis WBC 7.4->13.3->9.9 (UA pending)  Urinary retention requiring I&O cath  Hospital day # 2  This patient is critically ill and at significant risk of neurological worsening, death and care requires constant monitoring of vital signs, hemodynamics,respiratory and cardiac monitoring, extensive review of multiple databases, frequent neurological assessment, discussion with family, other specialists and medical decision making of high complexity. I spent 35 minutes of neurocritical care time  in the care of  this patient.  Rosalin Hawking, MD PhD Stroke Neurology 01/26/2020 5:04 PM   To contact Stroke Continuity provider,  please refer to [email or web address] After hours, contact General Neurology

## 2020-01-26 NOTE — Progress Notes (Signed)
VASCULAR LAB PRELIMINARY  PRELIMINARY  PRELIMINARY  PRELIMINARY  Bilateral lower extremity venous completed.    Preliminary report:  See CV proc for preliminary results.   Raechell Singleton, RVT 01/26/2020, 1:30 PM

## 2020-01-26 NOTE — Progress Notes (Signed)
NAME:  Ruben Chavez, MRN:  [id number], DOB:  [birth year], LOS: 2 ADMISSION DATE:  01/24/2020, CONSULTATION DATE:  01/25/20 REFERRING MD:  Cheral Marker  CHIEF COMPLAINT:  AMS   Brief History   Ruben Chavez is a 69 y.o. male who was admitted 2/18 with acute CVA 2/2 right M2 occlusion.  He received tPA and was taken for IR revascularization.  History of present illness    Ruben Chavez is a 69 y.o. male who has a PMH including but not limited to HTN, HLD, prostate CA (see "past medical history" for rest).  He presented to [facility] ED 2/18 with left sided weakness, left sided sensory loss, left facial droop, dysarthria, left visual field cut and anosognosia.  He received tPA.  CT revealed early ischemic infarct in right temporal lobe.  CTA revealed right M2 occlusion.  He was taken to IR for revascularization.  Post procedure, he was transferred to the ICU and PCCM was asked to assist with vent management.  Past Medical History  has WART, LEFT HAND; ERECTILE DYSFUNCTION, MILD; HERNIA, BILATERAL INGUINAL W/O OBST/GANGRENE; ACTINIC KERATOSIS, FOREHEAD, LEFT; BACK PAIN, LEFT; LATERAL EPICONDYLITIS; PROSTATE SPECIFIC ANTIGEN, ELEVATED; MENISCUS TEAR; History of colonic polyps; Essential hypertension; Prostate cancer ([place]); OA (osteoarthritis) of hip; Hyperlipidemia; Impingement syndrome of right shoulder region; Bilateral inguinal hernia; Stroke ([place]); Middle cerebral artery embolism, right; Stroke (cerebrum) ([place]); Acute respiratory failure with hypoxia ([place]); Hypotension; Hypokalemia; Hypocalcemia; and Encephalopathy acute on their problem list.  Significant Hospital Events   2/18 > admit.  Given TPA and subsequently underwent thrombectomy on 2/18  Consults:  PCCM.  Procedures:  ETT 2/18 >  R fem art line 2/18 >  Extubated 2/19  Significant Diagnostic Tests:  CT / CTA / CTP head 2/18 > subacute infarction of right temporal lobe. Embolic occlusion of the superior M2 branch on the right.  MRI  brain 2/19 >  Echo 2/19 >   Micro Data:  Flu 2/18 > neg. COVID 2/18 > neg.  Antimicrobials:  None.   Interim history/subjective:  He was extubated yesterday and has not had any difficulties in controlling secretions or with dyspnea.  He still has no use of the left hand.  Objective:  Blood pressure 126/61, pulse 64, temperature 98.4 F (36.9 C), temperature source Oral, resp. rate 11, height 5\' 9"  (1.753 m), weight 81.6 kg, SpO2 100 %.    Vent Mode: PRVC FiO2 (%):  [40 %] 40 % Set Rate:  [16 bmp] 16 bmp Vt Set:  [560 mL] 560 mL PEEP:  [5 cmH20] 5 cmH20 Plateau Pressure:  [15 cmH20-16 cmH20] 15 cmH20   Intake/Output Summary (Last 24 hours) at 01/26/2020 0945 Last data filed at 01/26/2020 0600 Gross per 24 hour  Intake 2237.87 ml  Output 1575 ml  Net 662.87 ml   Filed Weights   01/24/20 2000  Weight: 81.6 kg    Examination: General: Conversant and in no distress. Neuro: He is appropriately verbally interactive perhaps a little anxious.  Pupils are equal and EOMs appear to be full.  There is a slight left facial droop.  He cannot raise the left arm off the bed.  He is able to raise the left leg off the bed without difficulty. HEENT:  Cardiovascular: S1 and S2 are distant and regular without murmur rub or gallop    Lungs: Respirations are unlabored there is symmetric air movement, no wheezes the lungs are clear to supine exam    Abdomen: The abdomen is soft without  organomegaly masses tenderness guarding or rebound .  Musculoskeletal: No deformity or edema Skin: There is no ecchymosis or hematoma at the right groin sheath site.  Assessment & Plan:   Acute CVA 2/2 right M2 occlusion - s/p tPA  followed by IR revascularization. - Post procedure management per IR. - Stroke workup / management per neuro. - F/u on CT head, MRI brain, echo.   History of hypertension.  He was actually hypotensive when sedated for mechanical ventilation and transiently on Levophed.  We will  need to continue monitoring his blood pressure and may need to reintroduce his antihypertensives.    Respiratory insufficiency -extubation has been well-tolerated .  Hypokalemia. -Corrected   Hx EtOH use. - Thiamine / Folate.   Best Practice:  Diet: Regular  Pain/Anxiety/Delirium protocol (if indicated): Not indicated VAP protocol (if indicated): Extubated . DVT prophylaxis: SCD's and Lovenox. GI prophylaxis: PPI. Glucose control: SSI if glucose consistently > 180. Mobility: BR for now. Code Status: Full. Family Communication: Per primary. Disposition: ICU.  Labs   CBC: Recent Labs  Lab 01/24/20 2006 01/24/20 2032 01/25/20 0150 01/25/20 0502  WBC 7.4  --  13.3*  --   NEUTROABS 4.7  --  11.2*  --   HGB 14.6 15.6 13.9 13.6  HCT 43.2 46.0 40.3 40.0  MCV 94.7  --  93.9  --   PLT 264  --  269  --    Basic Metabolic Panel: Recent Labs  Lab 01/24/20 2006 01/24/20 2032 01/25/20 0150 01/25/20 0502  NA 139 140 135 137  K 3.5 3.4* 3.8 3.7  CL 104 104 103  --   CO2 20*  --  20*  --   GLUCOSE 103* 96 173*  --   BUN 17 20 14   --   CREATININE 0.95 0.80 0.85  --   CALCIUM 9.7  --  8.3*  --    GFR: Estimated Creatinine Clearance: 80.9 mL/min (by C-G formula based on SCr of 0.85 mg/dL). Recent Labs  Lab 01/24/20 2006 01/25/20 0150  WBC 7.4 13.3*   Liver Function Tests: Recent Labs  Lab 01/24/20 2006  AST 50*  ALT 55*  ALKPHOS 40  BILITOT 1.1  PROT 7.3  ALBUMIN 4.2   No results for input(s): LIPASE, AMYLASE in the last 168 hours. No results for input(s): AMMONIA in the last 168 hours. ABG    Component Value Date/Time   PHART 7.410 01/25/2020 0502   PCO2ART 35.1 01/25/2020 0502   PO2ART 188.0 (H) 01/25/2020 0502   HCO3 22.3 01/25/2020 0502   TCO2 23 01/25/2020 0502   ACIDBASEDEF 2.0 01/25/2020 0502   O2SAT 100.0 01/25/2020 0502    Coagulation Profile: Recent Labs  Lab 01/24/20 2006  INR 1.0   Cardiac Enzymes: No results for input(s): CKTOTAL,  CKMB, CKMBINDEX, TROPONINI in the last 168 hours. HbA1C: Hgb A1c MFr Bld  Date/Time Value Ref Range Status  01/25/2020 01:50 AM 5.0 4.8 - 5.6 % Final    Comment:    (NOTE) Pre diabetes:          5.7%-6.4% Diabetes:              >6.4% Glycemic control for   <7.0% adults with diabetes   10/09/2019 12:34 PM 5.3 4.6 - 6.5 % Final    Comment:    Glycemic Control Guidelines for People with Diabetes:Non Diabetic:  <6%Goal of Therapy: <7%Additional Action Suggested:  >8%    CBG: Recent Labs  Lab 01/24/20 2005 01/25/20 2256  GLUCAP 89 128*    Review of Systems:   Unable to obtain as pt is encephalopathic.  Past medical history  He,  has a past medical history of Elevated PSA, Hyperlipidemia, Hypertension, and Prostate cancer ([place]) (2013).   Surgical History    Past Surgical History:  Procedure Laterality Date  . bilateral inguinal hernia  2008  . CYST REMOVAL TRUNK  2016   sebaceous cyst  . CYSTOSCOPY  04/22/2016   Procedure: CYSTOSCOPY;  Surgeon: [name], MD;  Location: [facility];  Service: Urology;;  . KNEE ARTHROSCOPY  2007   right  . PROSTATE BIOPSY  2008  . PROSTATE BIOPSY  2013  . PROSTATE BIOPSY  01/28/2016  . RADIOACTIVE SEED IMPLANT N/A 04/22/2016   Procedure: RADIOACTIVE SEED IMPLANT/BRACHYTHERAPY IMPLANT;  Surgeon: [name], MD;  Location[facility];  Service: Urology;  Laterality: N/A;  . RADIOLOGY WITH ANESTHESIA N/A 01/24/2020   Procedure: IR WITH ANESTHESIA;  Surgeon: [name], MD;  Location: [place];  Service: Radiology;  Laterality: N/A;  . TONSILLECTOMY    . TOTAL HIP ARTHROPLASTY  2001   left  . TOTAL HIP ARTHROPLASTY Right 01/18/2018   Procedure: RIGHT TOTAL HIP ARTHROPLASTY ANTERIOR APPROACH;  Surgeon: [name], MD;  Location: [facility];  Service: Orthopedics;  Laterality: Right;     Social History   reports that he quit smoking about 20 years ago. His smoking use included cigarettes. He has  a 32.00 pack-year smoking history. He has never used smokeless tobacco. He reports current alcohol use of about 14.0 standard drinks of alcohol per week. He reports that he does not use drugs.   Family history   His family history includes Heart disease in his father; Pancreatic cancer in his mother. There is no history of Colon cancer.   Allergies Allergies  Allergen Reactions  . Sulfa Antibiotics Swelling and Other (See Comments)    Swelling in ankles   . Advil [Ibuprofen] Other (See Comments)    "Dots on chest" (Petechiae)  . Aleve [Naproxen] Other (See Comments)    "Dots on chest" (Petechiae)  . Betadine [Povidone Iodine] Itching and Rash  . Chloroxylenol (Antiseptic) Itching, Rash and Other (See Comments)    PCMX surgical sterilizing scrub  . Poison Ivy Extract Rash  . Povidone-Iodine Itching and Rash    BETADINE     Home meds  Prior to Admission medications   Medication Sig Start Date End Date Taking? Authorizing Provider  cephALEXin (KEFLEX) 500 MG capsule Take 2,000 mg by mouth See admin instructions. Take 2,000 mg by mouth one hour prior to dental appointment   Yes [provider]  HYDROcodone-acetaminophen (NORCO/VICODIN) 5-325 MG tablet Take 1 tablet by mouth every 6 (six) hours as needed for moderate pain. 11/16/19  Yes Isaac Bliss, Rayford Halsted, MD  lisinopril-hydrochlorothiazide (ZESTORETIC) 20-25 MG tablet Take 1 tablet by mouth daily. 09/19/19  Yes Isaac Bliss, Rayford Halsted, MD  methocarbamol (ROBAXIN) 500 MG tablet Take 1 tablet (500 mg total) by mouth every 8 (eight) hours as needed for muscle spasms. 11/12/19  Yes Billie Ruddy, MD       [facility] 01/26/2020, 9:45 AM

## 2020-01-27 DIAGNOSIS — I10 Essential (primary) hypertension: Secondary | ICD-10-CM

## 2020-01-27 LAB — BASIC METABOLIC PANEL
Anion gap: 11 (ref 5–15)
BUN: 10 mg/dL (ref 8–23)
CO2: 23 mmol/L (ref 22–32)
Calcium: 8.1 mg/dL — ABNORMAL LOW (ref 8.9–10.3)
Chloride: 104 mmol/L (ref 98–111)
Creatinine, Ser: 0.8 mg/dL (ref 0.61–1.24)
GFR calc Af Amer: >60 mL/min (ref >60)
GFR calc non Af Amer: >60 mL/min (ref >60)
Glucose, Bld: 103 mg/dL — ABNORMAL HIGH (ref 70–99)
Potassium: 3.1 mmol/L — ABNORMAL LOW (ref 3.5–5.1)
Sodium: 138 mmol/L (ref 135–145)

## 2020-01-27 LAB — CBC
HCT: 30.3 % — ABNORMAL LOW (ref 39.0–52.0)
Hemoglobin: 10 g/dL — ABNORMAL LOW (ref 13.0–17.0)
MCH: 32.2 pg (ref 26.0–34.0)
MCHC: 33 g/dL (ref 30.0–36.0)
MCV: 97.4 fL (ref 80.0–100.0)
Platelets: 149 10*3/uL — ABNORMAL LOW (ref 150–400)
RBC: 3.11 MIL/uL — ABNORMAL LOW (ref 4.22–5.81)
RDW: 14.4 % (ref 11.5–15.5)
WBC: 7.4 10*3/uL (ref 4.0–10.5)
nRBC: 0 % (ref 0.0–0.2)

## 2020-01-27 MED ORDER — LISINOPRIL 20 MG PO TABS
20.0000 mg | ORAL_TABLET | Freq: Every day | ORAL | Status: DC
  Administered 2020-01-27 – 2020-01-28 (×2): 20 mg via ORAL
  Filled 2020-01-27 (×2): qty 1

## 2020-01-27 MED ORDER — ASPIRIN EC 325 MG PO TBEC
325.0000 mg | DELAYED_RELEASE_TABLET | Freq: Every day | ORAL | Status: DC
  Administered 2020-01-28: 325 mg via ORAL
  Filled 2020-01-27: qty 1

## 2020-01-27 MED ORDER — TRAZODONE HCL 100 MG PO TABS
100.0000 mg | ORAL_TABLET | Freq: Every day | ORAL | Status: DC
  Administered 2020-01-27: 100 mg via ORAL
  Filled 2020-01-27: qty 1

## 2020-01-27 MED ORDER — POTASSIUM CHLORIDE CRYS ER 20 MEQ PO TBCR
40.0000 meq | EXTENDED_RELEASE_TABLET | ORAL | Status: AC
  Administered 2020-01-27 (×2): 40 meq via ORAL
  Filled 2020-01-27 (×2): qty 2

## 2020-01-27 NOTE — Progress Notes (Signed)
STROKE TEAM PROGRESS NOTE   INTERVAL HISTORY Wife is at bedside. Pt left UE much improved, now able to lift against gravity. Still not able to move left fingers. He worked with PT yesterday, on standing up walking, he had episode of orthostatic hypotension, BP down to 60s, he was put back in bed, placed on TED hose.   Vitals:   01/26/20 2313 01/27/20 0454 01/27/20 0814 01/27/20 1240  BP: (!) 157/80 (!) 168/84 (!) 151/79 (!) 145/71  Pulse: 77 81 74 82  Resp: _0 Temp: 99.3 F (37.4 C) 98.4 F (36.9 C) 98.5 F (36.9 C) 98.3 F (36.8 C)  TempSrc: Oral Oral Oral Oral  SpO2: 96% 97% 96% 97%  Weight:      Height:        CBC:  Recent Labs  Lab 01/24/20 2006 01/24/20 2032 01/25/20 0150 01/25/20 0502 01/26/20 1045 01/27/20 0339  WBC 7.4   < > 13.3*   < > 9.9 7.4  NEUTROABS 4.7  --  11.2*  --   --   --   HGB 14.6   < > 13.9   < > 11.5* 10.0*  HCT 43.2   < > 40.3   < > 34.3* 30.3*  MCV 94.7   < > 93.9   < > 98.3 97.4  PLT 264   < > 269   < > 167 149*   < > = values in this interval not displayed.    Basic Metabolic Panel:  Recent Labs  Lab 01/26/20 1045 01/27/20 0339  NA 139 138  K 4.4 3.1*  CL 109 104  CO2 21* 23  GLUCOSE 116* 103*  BUN 12 10  CREATININE 0.88 0.80  CALCIUM 8.4* 8.1*   Lipid Panel:     Component Value Date/Time   CHOL 117 01/25/2020 0150   TRIG 135 01/25/2020 0150   HDL 36 (L) 01/25/2020 0150   CHOLHDL 3.3 01/25/2020 0150   VLDL 27 01/25/2020 0150   LDLCALC 54 01/25/2020 0150   HgbA1c:  Lab Results  Component Value Date   HGBA1C 5.0 01/25/2020   Urine Drug Screen: No results found for: LABOPIA, COCAINSCRNUR, LABBENZ, AMPHETMU, THCU, LABBARB  Alcohol Level     Component Value Date/Time   ETH <10 01/24/2020 2006    IMAGING past 48 hours MR BRAIN WO CONTRAST  Result Date: 01/25/2020 CLINICAL DATA:  69 year old male code stroke presentation yesterday, received IV tPA. Right MCA M2 occlusion treated with endovascular  thrombectomy. EXAM: MRI HEAD WITHOUT CONTRAST TECHNIQUE: Multiplanar, multiecho pulse sequences of the brain and surrounding structures were obtained without intravenous contrast. COMPARISON:  CT head, CTA and CTP yesterday. FINDINGS: Brain: Patchy, confluent posterior right temporal lobe gray and white matter restricted diffusion corresponding to that evident by plain CT yesterday. Superimposed similar confluent infarct in the right operculum (series 7, image 52). Some involvement of the insula. Restricted diffusion here also tracks cephalad along the perirolandic cortex to the level of upper extremity representation. Additional smaller area of right superior frontal gyrus pre motor cortical restriction. Additional focus of right corona radiata involvement. T2 and FLAIR hyperintensity with petechial hemorrhage in the posterior right temporal lobe (Heidelberg Classification 1 A series 12, image 28). No other cerebral blood products identified. No significant mass effect. Small area of what appears to be chronic cortical encephalomalacia in the right parietal lobe on series 9, image 18. Pearline Cables and white matter signal in the left hemisphere and posterior fossa is normal  for age. No midline shift, ventriculomegaly, extra-axial collection. Cervicomedullary junction and pituitary are within normal limits. Vascular: Major intracranial vascular flow voids are preserved. Skull and upper cervical spine: Partially visible cervical spine degeneration at C3-C4. Visualized bone marrow signal is within normal limits. Sinuses/Orbits: Negative orbits. Mucosal thickening and small fluid levels in the ethmoid and sphenoid sinuses. Other: Trace mastoid fluid. Visible internal auditory structures appear normal. Incidental benign appearing right occipital scalp lipoma (series 14, image 6). IMPRESSION: 1. Patchy and scattered acute right MCA territory infarct. Heidelberg Classification 1a petechial blood in the right temporal lobe. No  significant mass effect. 2. Suggestion of a small area of chronic right parietal lobe cortical encephalomalacia. Negative for age noncontrast MRI appearance of the brain elsewhere. Electronically Signed   By: Genevie Ann M.D.   On: 01/25/2020 21:26   ECHOCARDIOGRAM COMPLETE  Result Date: 01/25/2020    ECHOCARDIOGRAM REPORT   Patient Name:   Ruben Chavez Date of Exam: 01/25/2020 Medical Rec #:  [id number]         Height:       69.0 in Accession #:    [id number]        Weight:       180.0 lb Date of Birth:  1951-08-05          BSA:          1.98 m Patient Age:    69 years          BP:           119/53 mmHg Patient Gender: M                 HR:           60 bpm. Exam Location:  Inpatient Procedure: 2D Echo Indications:    Stroke 434.91 / I163.9  History:        Patient has no prior history of Echocardiogram examinations.                 Signs/Symptoms:Hypotension; Risk Factors:Hypertension and                 Dyslipidemia.  Sonographer:    Vikki Ports Turrentine Referring Phys: [id number] ERIC LINDZEN  Sonographer Comments: Echo performed with patient supine and on artificial respirator. IMPRESSIONS  1. Left ventricular ejection fraction, by estimation, is 60 to 65%. The left ventricle has normal function. LV endocardial border not optimally defined to evaluate regional wall motion. Left ventricular diastolic parameters are consistent with Grade I diastolic dysfunction (impaired relaxation).  2. Right ventricular systolic function is normal. The right ventricular size is normal. Tricuspid regurgitation signal is inadequate for assessing PA pressure.  3. The mitral valve is grossly normal. No evidence of mitral valve regurgitation. No evidence of mitral stenosis.  4. The aortic valve is tricuspid. Aortic valve regurgitation is not visualized. No aortic stenosis is present. Comparison(s): No prior Echocardiogram. FINDINGS  Left Ventricle: Left ventricular ejection fraction, by estimation, is 60 to 65%. The left ventricle has  normal function. LV endocardial border not optimally defined to evaluate regional wall motion. The left ventricular internal cavity size was normal in size. There is no left ventricular hypertrophy. Left ventricular diastolic parameters are consistent with Grade I diastolic dysfunction (impaired relaxation). Right Ventricle: The right ventricular size is normal. No increase in right ventricular wall thickness. Right ventricular systolic function is normal. Tricuspid regurgitation signal is inadequate for assessing PA pressure. Left Atrium: Left atrial size was normal in size. Right  Atrium: Right atrial size was normal in size. Pericardium: There is no evidence of pericardial effusion. Mitral Valve: The mitral valve is grossly normal. No evidence of mitral valve regurgitation. No evidence of mitral valve stenosis. Tricuspid Valve: The tricuspid valve is grossly normal. Tricuspid valve regurgitation is not demonstrated. Aortic Valve: The aortic valve is tricuspid. Aortic valve regurgitation is not visualized. No aortic stenosis is present. Pulmonic Valve: The pulmonic valve was grossly normal. Pulmonic valve regurgitation is not visualized. Aorta: The aortic root is normal in size and structure. Venous: The inferior vena cava was not well visualized. IAS/Shunts: No atrial level shunt detected by color flow Doppler.  LEFT VENTRICLE PLAX 2D LVIDd:         3.79 cm  Diastology LVIDs:         2.87 cm  LV e' lateral:   13.50 cm/s LV PW:         0.96 cm  LV E/e' lateral: 5.1 LV IVS:        1.00 cm  LV e' medial:    9.90 cm/s LVOT diam:     2.10 cm  LV E/e' medial:  6.9 LV SV:         89.71 ml LV SV Index:   15.03 LVOT Area:     3.46 cm  RIGHT VENTRICLE RV S prime:     20.50 cm/s TAPSE (M-mode): 2.1 cm RIGHT ATRIUM           Index RA Area:     15.30 cm RA Volume:   35.00 ml  17.72 ml/m  AORTIC VALVE LVOT Vmax:   118.00 cm/s LVOT Vmean:  90.000 cm/s LVOT VTI:    0.259 m  AORTA Ao Root diam: 3.60 cm MITRAL VALVE MV Area  (PHT): 2.76 cm    SHUNTS MV Decel Time: 275 msec    Systemic VTI:  0.26 m MV E velocity: 68.60 cm/s  Systemic Diam: 2.10 cm MV A velocity: 85.30 cm/s MV E/A ratio:  0.80 Eleonore Chiquito MD Electronically signed by Eleonore Chiquito MD Signature Date/Time: 01/25/2020/4:26:55 PM    Final    VAS [place] LOWER EXTREMITY VENOUS (DVT)  Result Date: 01/26/2020  Lower Venous DVTStudy Indications: Stroke.  Comparison Study: No prior study on file for comparison Performing Technologist: Sharion Dove RVS  Examination Guidelines: A complete evaluation includes B-mode imaging, spectral Doppler, color Doppler, and power Doppler as needed of all accessible portions of each vessel. Bilateral testing is considered an integral part of a complete examination. Limited examinations for reoccurring indications may be performed as noted. The reflux portion of the exam is performed with the patient in reverse Trendelenburg.  +---------+---------------+---------+-----------+----------+--------------+ RIGHT    CompressibilityPhasicitySpontaneityPropertiesThrombus Aging +---------+---------------+---------+-----------+----------+--------------+ CFV      Full           Yes      Yes                                 +---------+---------------+---------+-----------+----------+--------------+ SFJ      Full                                                        +---------+---------------+---------+-----------+----------+--------------+ FV Prox  Full                                                        +---------+---------------+---------+-----------+----------+--------------+  FV Mid   Full                                                        +---------+---------------+---------+-----------+----------+--------------+ FV DistalFull                                                        +---------+---------------+---------+-----------+----------+--------------+ PFV      Full                                                         +---------+---------------+---------+-----------+----------+--------------+ POP      Full           Yes      Yes                                 +---------+---------------+---------+-----------+----------+--------------+ PTV      Full                                                        +---------+---------------+---------+-----------+----------+--------------+ PERO     Full                                                        +---------+---------------+---------+-----------+----------+--------------+   +---------+---------------+---------+-----------+----------+--------------+ LEFT     CompressibilityPhasicitySpontaneityPropertiesThrombus Aging +---------+---------------+---------+-----------+----------+--------------+ CFV      Full           Yes      Yes                                 +---------+---------------+---------+-----------+----------+--------------+ SFJ      Full                                                        +---------+---------------+---------+-----------+----------+--------------+ FV Prox  Full                                                        +---------+---------------+---------+-----------+----------+--------------+ FV Mid   Full                                                        +---------+---------------+---------+-----------+----------+--------------+  FV DistalFull                                                        +---------+---------------+---------+-----------+----------+--------------+ PFV      Full                                                        +---------+---------------+---------+-----------+----------+--------------+ POP      Full           Yes      Yes                                 +---------+---------------+---------+-----------+----------+--------------+ PTV      Full                                                         +---------+---------------+---------+-----------+----------+--------------+ PERO     Full                                                        +---------+---------------+---------+-----------+----------+--------------+     Summary: BILATERAL: - No evidence of deep vein thrombosis seen in the lower extremities, bilaterally.   *See table(s) above for measurements and observations. Electronically signed by Christopher Dickson MD on 01/26/2020 at 4:57:01 PM.    Final    Cerebral Angio 2/18/20121 S/P RT common carotid arteriogram followed by revascularization of occluded inf division of Rt MCA with x2 passes with 4mm x 40 mm solitaireX retriever and x 1 pass with 3mm x 32 mm Trevoprovue retriever and penumbra aspiration achieving a TICI 2b revascularization.  PHYSICAL EXAM  Temp:  [98.2 F (36.8 C)-99.3 F (37.4 C)] 98.3 F (36.8 C) (02/21 1240) Pulse Rate:  [74-83] 82 (02/21 1240) Resp:  [16-18] 18 (02/21 1240) BP: (136-168)/(66-84) 145/71 (02/21 1240) SpO2:  [96 %-100 %] 97 % (02/21 1240)  General - Well nourished, well developed, in no apparent distress.  Ophthalmologic - fundi not visualized due to noncooperation.  Cardiovascular - Regular rhythm and rate.  Mental Status -  Level of arousal and orientation to time, place, and person were intact. Language including expression, naming, repetition, comprehension was assessed and found intact. Fund of Knowledge was assessed and was intact.  Cranial Nerves II - XII - II - Visual field intact OU. III, IV, VI - Extraocular movements intact. V - Facial sensation intact bilaterally. VII - mild left facial droop. VIII - Hearing & vestibular intact bilaterally. X - Palate elevates symmetrically. XI - Chin turning & shoulder shrug intact bilaterally. XII - Tongue protrusion intact.  Motor Strength - The patient's strength was normal in RUE and RLE, however, LUE 3/5 proximal but 0/5 distal and LLE 4+/5 proximal and distal.  Bulk was  normal and fasciculations were absent.     Motor Tone - Muscle tone was assessed at the neck and appendages and was normal.  Reflexes - The patient's reflexes were symmetrical in all extremities except LUE decreased DTR and tone and he had no pathological reflexes.  Sensory - Light touch, temperature/pinprick were assessed and were decreased on the LUE and LLE    Coordination - The patient had normal movements in the right hand with no ataxia or dysmetria.  Tremor was absent.  Gait and Station - deferred.   ASSESSMENT/PLAN Ruben Chavez is a 69 y.o. male with history of HTN, HLD, prostate cancer presenting with L sided weakness, L hemisensory loss, L facial droop, L field cut, dysarthria and anosognosia. Received tPA 01/24/2020 at 2024. Taken to IR for R MCA occlusion.   Stroke:   [name]  CT head subacute R lateral temporal lobe and temporoparietal area infarcts.     CTA head & neck R M2 superior division occlusion. B ICA bifurcation atherosclerosis.   CT perfusion pseudonormalization R temporal lobe. R frontoparietal jxn infarct 46cc, R MCA 72cc.  Cerebral angio TICI2b revascularization R MCA  MRI - Patchy and scattered acute right MCA territory infarct. Petechial blood in the right temporal lobe.  2D Echo - EF 60 - 65%. No cardiac source of emboli identified.   LE venous Doppler - negative for DVT bilaterally  Consider loop placement before CIR placement  LDL 54 - no statin needed given goal LDL  HgbA1c 5.0  UDS pending  Lovenox for VTE prophylaxis  No antithrombotic prior to admission, now on ASA 325 mg and Plavix 75 daily. Continue DAPT for 3 months and then ASA alone.  Therapy recommendations:  CIR  Disposition:  pending   Hypertensive urgency Hypotension in setting of sedation  BP 180/120 on arrival  Home meds:  Lisinopril-HCTZ 20-25 daily . Was  on pressor, now off . BP stable . Resume lisinopril 20 . Long-term BP goal normotensive  Dysphagia   . Secondary to stroke . Dysarthria improved . Now on diet . Speech on board   Other Stroke Risk Factors  Advanced age  Former Cigarette smoker, quit 20 yrs ago  ETOH use, advised to drink no more than 2 drink(s) a day. On thiamine and folate and MVI.   Other Active Problems  Hx Prostate cancer  Hypokalemia 3.4-3.8-3.7->3.1->supplement  Hypocalcemia 8.3 - supplement  Leukocytosis WBC 7.4->13.3->9.9->7.4 (UA pending)  Urinary retention requiring I&O cath  Hospital day # 3  Rosalin Hawking, MD PhD Stroke Neurology 01/27/2020 6:55 PM  To contact Stroke Continuity provider, please refer to [email or web address] After hours, contact General Neurology

## 2020-01-27 NOTE — Evaluation (Signed)
Speech Language Pathology Evaluation Patient Details Name: Ruben Chavez MRN: [id number] DOB: 18-Sep-1951 Today's Date: 01/27/2020 Time: [id number] SLP Time Calculation (min) (ACUTE ONLY): 19 min  Problem List:  Patient Active Problem List   Diagnosis Date Noted  . Stroke (cerebrum) ([place]) 01/25/2020  . Acute respiratory failure with hypoxia ([place])   . Hypotension   . Hypokalemia   . Hypocalcemia   . Encephalopathy acute   . Stroke ([place]) 01/24/2020  . Middle cerebral artery embolism, right 01/24/2020  . Hyperlipidemia 02/21/2019  . Impingement syndrome of right shoulder region 02/21/2018  . OA (osteoarthritis) of hip 01/18/2018  . Prostate cancer ([place]) 03/04/2016  . Essential hypertension 11/27/2014  . History of colonic polyps 09/26/2014  . BACK PAIN, LEFT 12/22/2009  . PROSTATE SPECIFIC ANTIGEN, ELEVATED 12/20/2008  . LATERAL EPICONDYLITIS 07/15/2008  . ACTINIC KERATOSIS, FOREHEAD, LEFT 05/23/2008  . WART, LEFT HAND 07/19/2007  . ERECTILE DYSFUNCTION, MILD 07/19/2007  . HERNIA, BILATERAL INGUINAL W/O OBST/GANGRENE 07/19/2007  . MENISCUS TEAR 07/19/2007  . Bilateral inguinal hernia 07/19/2007   Past Medical History:  Past Medical History:  Diagnosis Date  . Elevated PSA   . Hyperlipidemia    pt says not anymore  . Hypertension   . Prostate cancer [facility]) 2013   Past Surgical History:  Past Surgical History:  Procedure Laterality Date  . bilateral inguinal hernia  2008  . CYST REMOVAL TRUNK  2016   sebaceous cyst  . CYSTOSCOPY  04/22/2016   Procedure: CYSTOSCOPY;  Surgeon: [name], MD;  Location: [facility];  Service: Urology;;  . KNEE ARTHROSCOPY  2007   right  . PROSTATE BIOPSY  2008  . PROSTATE BIOPSY  2013  . PROSTATE BIOPSY  01/28/2016  . RADIOACTIVE SEED IMPLANT N/A 04/22/2016   Procedure: RADIOACTIVE SEED IMPLANT/BRACHYTHERAPY IMPLANT;  Surgeon: [name], MD;  Location: [facility];  Service: Urology;   Laterality: N/A;  . RADIOLOGY WITH ANESTHESIA N/A 01/24/2020   Procedure: IR WITH ANESTHESIA;  Surgeon: [name], MD;  Location: [place];  Service: Radiology;  Laterality: N/A;  . TONSILLECTOMY    . TOTAL HIP ARTHROPLASTY  2001   left  . TOTAL HIP ARTHROPLASTY Right 01/18/2018   Procedure: RIGHT TOTAL HIP ARTHROPLASTY ANTERIOR APPROACH;  Surgeon: [name], MD;  Location: [facility];  Service: Orthopedics;  Laterality: Right;   HPI:  Pt is an 69 y.o. male who presented acutely to the ED after acute onset of left sided weakness and dysarthria. MRI: Patchy and scattered acute right MCA territory infarct. Small area of chronic right parietal lobe cortical encephalomalacia. Pt received tPA and underwent revascularization.   Assessment / Plan / Recommendation Clinical Impression  Pt participated in speech/language/cognition evaluation with his wife present for part of the evaluation. Pt denied any baseline deficits in speech, language, or cognition but remarked that his wife may say that he typically has difficulty with memory. 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 He reported that his speech is not as clear as at baseline and indicated that he believes it is now approximately 70% back to baseline. The [facility] Mental Status Examination was completed to evaluate the pt's cognitive-linguistic skills. He achieved a score of 24/30 which is below the normal limits of 27 or more out of 30 and is suggestive of a mild impairment. He exhibited difficulty in the areas of awareness, attention, memory, executive function, and complex problem solving. He also presented with  mild dysarthria characterized by reduced articulatory precision which negatively impacted speech intelligibility at the conversational level. Skilled SLP services are clinically indicated at this time to improve motor speech and cognitive-linguistic function.    SLP Assessment  SLP  Recommendation/Assessment: Patient needs continued Speech Lanaguage Pathology Services SLP Visit Diagnosis: Cognitive communication deficit (R41.841);Dysarthria and anarthria (R47.1)    Follow Up Recommendations  Inpatient Rehab    Frequency and Duration min 2x/week  2 weeks      SLP Evaluation Cognition  Overall Cognitive Status: Impaired/Different from baseline Arousal/Alertness: Awake/alert Orientation Level: Oriented X4 Attention: Focused;Sustained Focused Attention: Appears intact Sustained Attention: Impaired Sustained Attention Impairment: Verbal complex Memory: Impaired Memory Impairment: Storage deficit;Retrieval deficit;Decreased recall of new information(Immediate: 5/5; delayed: 3/5) Awareness: Impaired Awareness Impairment: Intellectual impairment Problem Solving: (WNL for functional problem solving) Executive Function: Reasoning;Sequencing Reasoning: Impaired Reasoning Impairment: Verbal complex Sequencing: Appears intact Safety/Judgment: Appears intact       Comprehension  Auditory Comprehension Overall Auditory Comprehension: Appears within functional limits for tasks assessed Yes/No Questions: Within Functional Limits Commands: Impaired One Step Basic Commands: (WNL) Two Step Basic Commands: 25-49% accurate Interfering Components: Attention;Working memory;Processing speed EffectiveTechniques: Artist Discrimination: Within Function Limits Reading Comprehension Reading Status: Not tested    Expression Expression Primary Mode of Expression: Verbal Verbal Expression Overall Verbal Expression: Appears within functional limits for tasks assessed Initiation: No impairment Naming: No impairment Pragmatics: No impairment Interfering Components: Attention   Oral / Motor  Oral Motor/Sensory Function Overall Oral Motor/Sensory Function: Mild impairment Facial ROM: Reduced left Facial  Symmetry: Abnormal symmetry left Facial Strength: Reduced left Facial Sensation: Within Functional Limits Lingual ROM: Within Functional Limits Lingual Symmetry: Abnormal symmetry left Lingual Strength: Reduced;Suspected CN XII (hypoglossal) dysfunction Lingual Sensation: Within Functional Limits Velum: Within Functional Limits Mandible: Within Functional Limits Motor Speech Overall Motor Speech: Impaired Respiration: Impaired Level of Impairment: Conversation Phonation: Normal Resonance: Within functional limits Articulation: Impaired Level of Impairment: Conversation Intelligibility: Intelligibility reduced Word: 75-100% accurate Phrase: 75-100% accurate Sentence: 75-100% accurate Conversation: 50-74% accurate Motor Planning: Witnin functional limits Motor Speech Errors: Not applicable Effective Techniques: Slow rate;Increased vocal intensity   Giovanni Bath I. Hardin Negus, [place], [place] Office number [phone] Pager [place] 01/27/2020, 3:32 PM

## 2020-01-27 NOTE — H&P (View-Only) (Signed)
STROKE TEAM PROGRESS NOTE   INTERVAL HISTORY Wife is at bedside. Pt left UE much improved, now able to lift against gravity. Still not able to move left fingers. He worked with PT yesterday, on standing up walking, he had episode of orthostatic hypotension, BP down to 60s, he was put back in bed, placed on TED hose.   Vitals:   01/26/20 2313 01/27/20 0454 01/27/20 0814 01/27/20 1240  BP: (!) 157/80 (!) 168/84 (!) 151/79 (!) 145/71  Pulse: 77 81 74 82  Resp: _0 Temp: 99.3 F (37.4 C) 98.4 F (36.9 C) 98.5 F (36.9 C) 98.3 F (36.8 C)  TempSrc: Oral Oral Oral Oral  SpO2: 96% 97% 96% 97%  Weight:      Height:        CBC:  Recent Labs  Lab 01/24/20 2006 01/24/20 2032 01/25/20 0150 01/25/20 0502 01/26/20 1045 01/27/20 0339  WBC 7.4   < > 13.3*   < > 9.9 7.4  NEUTROABS 4.7  --  11.2*  --   --   --   HGB 14.6   < > 13.9   < > 11.5* 10.0*  HCT 43.2   < > 40.3   < > 34.3* 30.3*  MCV 94.7   < > 93.9   < > 98.3 97.4  PLT 264   < > 269   < > 167 149*   < > = values in this interval not displayed.    Basic Metabolic Panel:  Recent Labs  Lab 01/26/20 1045 01/27/20 0339  NA 139 138  K 4.4 3.1*  CL 109 104  CO2 21* 23  GLUCOSE 116* 103*  BUN 12 10  CREATININE 0.88 0.80  CALCIUM 8.4* 8.1*   Lipid Panel:     Component Value Date/Time   CHOL 117 01/25/2020 0150   TRIG 135 01/25/2020 0150   HDL 36 (L) 01/25/2020 0150   CHOLHDL 3.3 01/25/2020 0150   VLDL 27 01/25/2020 0150   LDLCALC 54 01/25/2020 0150   HgbA1c:  Lab Results  Component Value Date   HGBA1C 5.0 01/25/2020   Urine Drug Screen: No results found for: LABOPIA, COCAINSCRNUR, LABBENZ, AMPHETMU, THCU, LABBARB  Alcohol Level     Component Value Date/Time   ETH <10 01/24/2020 2006    IMAGING past 48 hours MR BRAIN WO CONTRAST  Result Date: 01/25/2020 CLINICAL DATA:  69 year old male code stroke presentation yesterday, received IV tPA. Right MCA M2 occlusion treated with endovascular  thrombectomy. EXAM: MRI HEAD WITHOUT CONTRAST TECHNIQUE: Multiplanar, multiecho pulse sequences of the brain and surrounding structures were obtained without intravenous contrast. COMPARISON:  CT head, CTA and CTP yesterday. FINDINGS: Brain: Patchy, confluent posterior right temporal lobe gray and white matter restricted diffusion corresponding to that evident by plain CT yesterday. Superimposed similar confluent infarct in the right operculum (series 7, image 52). Some involvement of the insula. Restricted diffusion here also tracks cephalad along the perirolandic cortex to the level of upper extremity representation. Additional smaller area of right superior frontal gyrus pre motor cortical restriction. Additional focus of right corona radiata involvement. T2 and FLAIR hyperintensity with petechial hemorrhage in the posterior right temporal lobe (Heidelberg Classification 1 A series 12, image 28). No other cerebral blood products identified. No significant mass effect. Small area of what appears to be chronic cortical encephalomalacia in the right parietal lobe on series 9, image 18. Pearline Cables and white matter signal in the left hemisphere and posterior fossa is normal  for age. No midline shift, ventriculomegaly, extra-axial collection. Cervicomedullary junction and pituitary are within normal limits. Vascular: Major intracranial vascular flow voids are preserved. Skull and upper cervical spine: Partially visible cervical spine degeneration at C3-C4. Visualized bone marrow signal is within normal limits. Sinuses/Orbits: Negative orbits. Mucosal thickening and small fluid levels in the ethmoid and sphenoid sinuses. Other: Trace mastoid fluid. Visible internal auditory structures appear normal. Incidental benign appearing right occipital scalp lipoma (series 14, image 6). IMPRESSION: 1. Patchy and scattered acute right MCA territory infarct. Heidelberg Classification 1a petechial blood in the right temporal lobe. No  significant mass effect. 2. Suggestion of a small area of chronic right parietal lobe cortical encephalomalacia. Negative for age noncontrast MRI appearance of the brain elsewhere. Electronically Signed   By: Genevie Ann M.D.   On: 01/25/2020 21:26   ECHOCARDIOGRAM COMPLETE  Result Date: 01/25/2020    ECHOCARDIOGRAM REPORT   Patient Name:   Ruben Chavez Date of Exam: 01/25/2020 Medical Rec #:  [id number]         Height:       69.0 in Accession #:    [id number]        Weight:       180.0 lb Date of Birth:  Apr 24, 1951          BSA:          1.98 m Patient Age:    69 years          BP:           119/53 mmHg Patient Gender: M                 HR:           60 bpm. Exam Location:  Inpatient Procedure: 2D Echo Indications:    Stroke 434.91 / I163.9  History:        Patient has no prior history of Echocardiogram examinations.                 Signs/Symptoms:Hypotension; Risk Factors:Hypertension and                 Dyslipidemia.  Sonographer:    Vikki Ports Turrentine Referring Phys: [phone] ERIC LINDZEN  Sonographer Comments: Echo performed with patient supine and on artificial respirator. IMPRESSIONS  1. Left ventricular ejection fraction, by estimation, is 60 to 65%. The left ventricle has normal function. LV endocardial border not optimally defined to evaluate regional wall motion. Left ventricular diastolic parameters are consistent with Grade I diastolic dysfunction (impaired relaxation).  2. Right ventricular systolic function is normal. The right ventricular size is normal. Tricuspid regurgitation signal is inadequate for assessing PA pressure.  3. The mitral valve is grossly normal. No evidence of mitral valve regurgitation. No evidence of mitral stenosis.  4. The aortic valve is tricuspid. Aortic valve regurgitation is not visualized. No aortic stenosis is present. Comparison(s): No prior Echocardiogram. FINDINGS  Left Ventricle: Left ventricular ejection fraction, by estimation, is 60 to 65%. The left ventricle has  normal function. LV endocardial border not optimally defined to evaluate regional wall motion. The left ventricular internal cavity size was normal in size. There is no left ventricular hypertrophy. Left ventricular diastolic parameters are consistent with Grade I diastolic dysfunction (impaired relaxation). Right Ventricle: The right ventricular size is normal. No increase in right ventricular wall thickness. Right ventricular systolic function is normal. Tricuspid regurgitation signal is inadequate for assessing PA pressure. Left Atrium: Left atrial size was normal in size. Right  Atrium: Right atrial size was normal in size. Pericardium: There is no evidence of pericardial effusion. Mitral Valve: The mitral valve is grossly normal. No evidence of mitral valve regurgitation. No evidence of mitral valve stenosis. Tricuspid Valve: The tricuspid valve is grossly normal. Tricuspid valve regurgitation is not demonstrated. Aortic Valve: The aortic valve is tricuspid. Aortic valve regurgitation is not visualized. No aortic stenosis is present. Pulmonic Valve: The pulmonic valve was grossly normal. Pulmonic valve regurgitation is not visualized. Aorta: The aortic root is normal in size and structure. Venous: The inferior vena cava was not well visualized. IAS/Shunts: No atrial level shunt detected by color flow Doppler.  LEFT VENTRICLE PLAX 2D LVIDd:         3.79 cm  Diastology LVIDs:         2.87 cm  LV e' lateral:   13.50 cm/s LV PW:         0.96 cm  LV E/e' lateral: 5.1 LV IVS:        1.00 cm  LV e' medial:    9.90 cm/s LVOT diam:     2.10 cm  LV E/e' medial:  6.9 LV SV:         89.71 ml LV SV Index:   15.03 LVOT Area:     3.46 cm  RIGHT VENTRICLE RV S prime:     20.50 cm/s TAPSE (M-mode): 2.1 cm RIGHT ATRIUM           Index RA Area:     15.30 cm RA Volume:   35.00 ml  17.72 ml/m  AORTIC VALVE LVOT Vmax:   118.00 cm/s LVOT Vmean:  90.000 cm/s LVOT VTI:    0.259 m  AORTA Ao Root diam: 3.60 cm MITRAL VALVE MV Area  (PHT): 2.76 cm    SHUNTS MV Decel Time: 275 msec    Systemic VTI:  0.26 m MV E velocity: 68.60 cm/s  Systemic Diam: 2.10 cm MV A velocity: 85.30 cm/s MV E/A ratio:  0.80 Eleonore Chiquito MD Electronically signed by Eleonore Chiquito MD Signature Date/Time: 01/25/2020/4:26:55 PM    Final    VAS [place] LOWER EXTREMITY VENOUS (DVT)  Result Date: 01/26/2020  Lower Venous DVTStudy Indications: Stroke.  Comparison Study: No prior study on file for comparison Performing Technologist: Sharion Dove RVS  Examination Guidelines: A complete evaluation includes B-mode imaging, spectral Doppler, color Doppler, and power Doppler as needed of all accessible portions of each vessel. Bilateral testing is considered an integral part of a complete examination. Limited examinations for reoccurring indications may be performed as noted. The reflux portion of the exam is performed with the patient in reverse Trendelenburg.  +---------+---------------+---------+-----------+----------+--------------+ RIGHT    CompressibilityPhasicitySpontaneityPropertiesThrombus Aging +---------+---------------+---------+-----------+----------+--------------+ CFV      Full           Yes      Yes                                 +---------+---------------+---------+-----------+----------+--------------+ SFJ      Full                                                        +---------+---------------+---------+-----------+----------+--------------+ FV Prox  Full                                                        +---------+---------------+---------+-----------+----------+--------------+  FV Mid   Full                                                        +---------+---------------+---------+-----------+----------+--------------+ FV DistalFull                                                        +---------+---------------+---------+-----------+----------+--------------+ PFV      Full                                                         +---------+---------------+---------+-----------+----------+--------------+ POP      Full           Yes      Yes                                 +---------+---------------+---------+-----------+----------+--------------+ PTV      Full                                                        +---------+---------------+---------+-----------+----------+--------------+ PERO     Full                                                        +---------+---------------+---------+-----------+----------+--------------+   +---------+---------------+---------+-----------+----------+--------------+ LEFT     CompressibilityPhasicitySpontaneityPropertiesThrombus Aging +---------+---------------+---------+-----------+----------+--------------+ CFV      Full           Yes      Yes                                 +---------+---------------+---------+-----------+----------+--------------+ SFJ      Full                                                        +---------+---------------+---------+-----------+----------+--------------+ FV Prox  Full                                                        +---------+---------------+---------+-----------+----------+--------------+ FV Mid   Full                                                        +---------+---------------+---------+-----------+----------+--------------+  FV DistalFull                                                        +---------+---------------+---------+-----------+----------+--------------+ PFV      Full                                                        +---------+---------------+---------+-----------+----------+--------------+ POP      Full           Yes      Yes                                 +---------+---------------+---------+-----------+----------+--------------+ PTV      Full                                                         +---------+---------------+---------+-----------+----------+--------------+ PERO     Full                                                        +---------+---------------+---------+-----------+----------+--------------+     Summary: BILATERAL: - No evidence of deep vein thrombosis seen in the lower extremities, bilaterally.   *See table(s) above for measurements and observations. Electronically signed by Deitra Mayo MD on 01/26/2020 at 4:57:01 PM.    Final    Cerebral Angio 2/18/20121 S/P RT common carotid arteriogram followed by revascularization of occluded inf division of Rt MCA with x2 passes with 52m x 40 mm solitaireX retriever and x 1 pass with 337mx 32 mm Trevoprovue retriever and penumbra aspiration achieving a TICI 2b revascularization.  PHYSICAL EXAM  Temp:  [98.2 F (36.8 C)-99.3 F (37.4 C)] 98.3 F (36.8 C) (02/21 1240) Pulse Rate:  [74-83] 82 (02/21 1240) Resp:  [16-18] 18 (02/21 1240) BP: (136-168)/(66-84) 145/71 (02/21 1240) SpO2:  [96 %-100 %] 97 % (02/21 1240)  General - Well nourished, well developed, in no apparent distress.  Ophthalmologic - fundi not visualized due to noncooperation.  Cardiovascular - Regular rhythm and rate.  Mental Status -  Level of arousal and orientation to time, place, and person were intact. Language including expression, naming, repetition, comprehension was assessed and found intact. Fund of Knowledge was assessed and was intact.  Cranial Nerves II - XII - II - Visual field intact OU. III, IV, VI - Extraocular movements intact. V - Facial sensation intact bilaterally. VII - mild left facial droop. VIII - Hearing & vestibular intact bilaterally. X - Palate elevates symmetrically. XI - Chin turning & shoulder shrug intact bilaterally. XII - Tongue protrusion intact.  Motor Strength - The patient's strength was normal in RUE and RLE, however, LUE 3/5 proximal but 0/5 distal and LLE 4+/5 proximal and distal.  Bulk was  normal and fasciculations were absent.  Motor Tone - Muscle tone was assessed at the neck and appendages and was normal.  Reflexes - The patient's reflexes were symmetrical in all extremities except LUE decreased DTR and tone and he had no pathological reflexes.  Sensory - Light touch, temperature/pinprick were assessed and were decreased on the LUE and LLE    Coordination - The patient had normal movements in the right hand with no ataxia or dysmetria.  Tremor was absent.  Gait and Station - deferred.   ASSESSMENT/PLAN Ruben Chavez is a 69 y.o. male with history of HTN, HLD, prostate cancer presenting with L sided weakness, L hemisensory loss, L facial droop, L field cut, dysarthria and anosognosia. Received tPA 01/24/2020 at 2024. Taken to IR for R MCA occlusion.   Stroke:   [name]  CT head subacute R lateral temporal lobe and temporoparietal area infarcts.     CTA head & neck R M2 superior division occlusion. B ICA bifurcation atherosclerosis.   CT perfusion pseudonormalization R temporal lobe. R frontoparietal jxn infarct 46cc, R MCA 72cc.  Cerebral angio TICI2b revascularization R MCA  MRI - Patchy and scattered acute right MCA territory infarct. Petechial blood in the right temporal lobe.  2D Echo - EF 60 - 65%. No cardiac source of emboli identified.   LE venous Doppler - negative for DVT bilaterally  Consider loop placement before CIR placement  LDL 54 - no statin needed given goal LDL  HgbA1c 5.0  UDS pending  Lovenox for VTE prophylaxis  No antithrombotic prior to admission, now on ASA 325 mg and Plavix 75 daily. Continue DAPT for 3 months and then ASA alone.  Therapy recommendations:  CIR  Disposition:  pending   Hypertensive urgency Hypotension in setting of sedation  BP 180/120 on arrival  Home meds:  Lisinopril-HCTZ 20-25 daily . Was  on pressor, now off . BP stable . Resume lisinopril 20 . Long-term BP goal normotensive  Dysphagia   . Secondary to stroke . Dysarthria improved . Now on diet . Speech on board   Other Stroke Risk Factors  Advanced age  Former Cigarette smoker, quit 20 yrs ago  ETOH use, advised to drink no more than 2 drink(s) a day. On thiamine and folate and MVI.   Other Active Problems  Hx Prostate cancer  Hypokalemia 3.4-3.8-3.7->3.1->supplement  Hypocalcemia 8.3 - supplement  Leukocytosis WBC 7.4->13.3->9.9->7.4 (UA pending)  Urinary retention requiring I&O cath  Hospital day # 3  Rosalin Hawking, MD PhD Stroke Neurology 01/27/2020 6:55 PM  To contact Stroke Continuity provider, please refer to [email or web address] After hours, contact General Neurology

## 2020-01-28 ENCOUNTER — Inpatient Hospital Stay (HOSPITAL_COMMUNITY): Payer: PPO | Admitting: Anesthesiology

## 2020-01-28 ENCOUNTER — Inpatient Hospital Stay (HOSPITAL_COMMUNITY): Payer: PPO

## 2020-01-28 ENCOUNTER — Other Ambulatory Visit: Payer: Self-pay

## 2020-01-28 ENCOUNTER — Encounter (HOSPITAL_COMMUNITY)
Admission: EM | Disposition: A | Discharge status: Rehabilitation Facility | Payer: Self-pay | Source: Home / Self Care | Attending: Neurology

## 2020-01-28 ENCOUNTER — Inpatient Hospital Stay (HOSPITAL_COMMUNITY)
Admission: RE | Admit: 2020-01-28 | Discharge: 2020-02-13 | DRG: 057 | Disposition: A | Discharge status: Home / Self Care | Payer: PPO | Source: Intra-hospital | Attending: Physical Medicine and Rehabilitation | Admitting: Physical Medicine and Rehabilitation

## 2020-01-28 DIAGNOSIS — F0789 Other personality and behavioral disorders due to known physiological condition: Secondary | ICD-10-CM | POA: Diagnosis not present

## 2020-01-28 DIAGNOSIS — I63511 Cerebral infarction due to unspecified occlusion or stenosis of right middle cerebral artery: Secondary | ICD-10-CM | POA: Diagnosis not present

## 2020-01-28 DIAGNOSIS — Z87891 Personal history of nicotine dependence: Secondary | ICD-10-CM

## 2020-01-28 DIAGNOSIS — Z79899 Other long term (current) drug therapy: Secondary | ICD-10-CM

## 2020-01-28 DIAGNOSIS — F09 Unspecified mental disorder due to known physiological condition: Secondary | ICD-10-CM | POA: Diagnosis not present

## 2020-01-28 DIAGNOSIS — R21 Rash and other nonspecific skin eruption: Secondary | ICD-10-CM | POA: Diagnosis not present

## 2020-01-28 DIAGNOSIS — I6389 Other cerebral infarction: Secondary | ICD-10-CM

## 2020-01-28 DIAGNOSIS — Z20822 Contact with and (suspected) exposure to covid-19: Secondary | ICD-10-CM | POA: Diagnosis not present

## 2020-01-28 DIAGNOSIS — M7989 Other specified soft tissue disorders: Secondary | ICD-10-CM | POA: Diagnosis not present

## 2020-01-28 DIAGNOSIS — E785 Hyperlipidemia, unspecified: Secondary | ICD-10-CM

## 2020-01-28 DIAGNOSIS — K5900 Constipation, unspecified: Secondary | ICD-10-CM | POA: Diagnosis not present

## 2020-01-28 DIAGNOSIS — I69392 Facial weakness following cerebral infarction: Secondary | ICD-10-CM | POA: Diagnosis not present

## 2020-01-28 DIAGNOSIS — I63411 Cerebral infarction due to embolism of right middle cerebral artery: Principal | ICD-10-CM

## 2020-01-28 DIAGNOSIS — I69354 Hemiplegia and hemiparesis following cerebral infarction affecting left non-dominant side: Secondary | ICD-10-CM | POA: Diagnosis not present

## 2020-01-28 DIAGNOSIS — C61 Malignant neoplasm of prostate: Secondary | ICD-10-CM | POA: Diagnosis not present

## 2020-01-28 DIAGNOSIS — I69322 Dysarthria following cerebral infarction: Secondary | ICD-10-CM | POA: Diagnosis not present

## 2020-01-28 DIAGNOSIS — R339 Retention of urine, unspecified: Secondary | ICD-10-CM | POA: Diagnosis not present

## 2020-01-28 DIAGNOSIS — I10 Essential (primary) hypertension: Secondary | ICD-10-CM | POA: Diagnosis not present

## 2020-01-28 DIAGNOSIS — Z96643 Presence of artificial hip joint, bilateral: Secondary | ICD-10-CM | POA: Diagnosis not present

## 2020-01-28 DIAGNOSIS — Z7982 Long term (current) use of aspirin: Secondary | ICD-10-CM

## 2020-01-28 DIAGNOSIS — F329 Major depressive disorder, single episode, unspecified: Secondary | ICD-10-CM | POA: Diagnosis not present

## 2020-01-28 DIAGNOSIS — I6601 Occlusion and stenosis of right middle cerebral artery: Secondary | ICD-10-CM

## 2020-01-28 DIAGNOSIS — I160 Hypertensive urgency: Secondary | ICD-10-CM

## 2020-01-28 HISTORY — PX: LOOP RECORDER INSERTION: EP1214

## 2020-01-28 HISTORY — PX: BUBBLE STUDY: SHX6837

## 2020-01-28 HISTORY — PX: TEE WITHOUT CARDIOVERSION: SHX5443

## 2020-01-28 LAB — BASIC METABOLIC PANEL
Anion gap: 6 (ref 5–15)
BUN: 11 mg/dL (ref 8–23)
CO2: 24 mmol/L (ref 22–32)
Calcium: 8 mg/dL — ABNORMAL LOW (ref 8.9–10.3)
Chloride: 108 mmol/L (ref 98–111)
Creatinine, Ser: 0.9 mg/dL (ref 0.61–1.24)
GFR calc Af Amer: >60 mL/min (ref >60)
GFR calc non Af Amer: >60 mL/min (ref >60)
Glucose, Bld: 111 mg/dL — ABNORMAL HIGH (ref 70–99)
Potassium: 3.7 mmol/L (ref 3.5–5.1)
Sodium: 138 mmol/L (ref 135–145)

## 2020-01-28 LAB — CBC
HCT: 29.4 % — ABNORMAL LOW (ref 39.0–52.0)
Hemoglobin: 9.9 g/dL — ABNORMAL LOW (ref 13.0–17.0)
MCH: 32.4 pg (ref 26.0–34.0)
MCHC: 33.7 g/dL (ref 30.0–36.0)
MCV: 96.1 fL (ref 80.0–100.0)
Platelets: 169 10*3/uL (ref 150–400)
RBC: 3.06 MIL/uL — ABNORMAL LOW (ref 4.22–5.81)
RDW: 13.7 % (ref 11.5–15.5)
WBC: 6.1 10*3/uL (ref 4.0–10.5)
nRBC: 0 % (ref 0.0–0.2)

## 2020-01-28 LAB — PHOSPHORUS: Phosphorus: 2.4 mg/dL — ABNORMAL LOW (ref 2.5–4.6)

## 2020-01-28 LAB — MAGNESIUM: Magnesium: 1.8 mg/dL (ref 1.7–2.4)

## 2020-01-28 SURGERY — ECHOCARDIOGRAM, TRANSESOPHAGEAL
Anesthesia: Monitor Anesthesia Care

## 2020-01-28 SURGERY — LOOP RECORDER INSERTION

## 2020-01-28 MED ORDER — LIDOCAINE-EPINEPHRINE 1 %-1:100000 IJ SOLN
INTRAMUSCULAR | Status: DC | PRN
  Administered 2020-01-28: 15 mL

## 2020-01-28 MED ORDER — PROPOFOL 500 MG/50ML IV EMUL
INTRAVENOUS | Status: DC | PRN
  Administered 2020-01-28: 100 ug/kg/min via INTRAVENOUS

## 2020-01-28 MED ORDER — PANTOPRAZOLE SODIUM 40 MG PO TBEC
40.0000 mg | DELAYED_RELEASE_TABLET | Freq: Every day | ORAL | Status: DC
  Administered 2020-01-29 – 2020-02-13 (×16): 40 mg via ORAL
  Filled 2020-01-28 (×16): qty 1

## 2020-01-28 MED ORDER — ACETAMINOPHEN 325 MG PO TABS: 650.0000 mg | ORAL_TABLET | ORAL | Status: DC | PRN

## 2020-01-28 MED ORDER — BUTAMBEN-TETRACAINE-BENZOCAINE 2-2-14 % EX AERO
INHALATION_SPRAY | CUTANEOUS | Status: DC | PRN
  Administered 2020-01-28: 14:00:00 2 via TOPICAL

## 2020-01-28 MED ORDER — FOLIC ACID 1 MG PO TABS
1.0000 mg | ORAL_TABLET | Freq: Every day | ORAL | Status: DC
  Administered 2020-01-29 – 2020-02-13 (×16): 1 mg via ORAL
  Filled 2020-01-28 (×16): qty 1

## 2020-01-28 MED ORDER — THIAMINE HCL 100 MG PO TABS
100.0000 mg | ORAL_TABLET | Freq: Every day | ORAL | Status: DC
  Administered 2020-01-29 – 2020-02-13 (×16): 100 mg via ORAL
  Filled 2020-01-28 (×16): qty 1

## 2020-01-28 MED ORDER — FOLIC ACID 1 MG PO TABS: 1.0000 mg | ORAL_TABLET | Freq: Every day | ORAL | Status: DC

## 2020-01-28 MED ORDER — SODIUM CHLORIDE 0.9 % IV SOLN
INTRAVENOUS | Status: DC | PRN
  Administered 2020-01-28: 500 mL via INTRAMUSCULAR

## 2020-01-28 MED ORDER — ACETAMINOPHEN 160 MG/5ML PO SOLN: 650.0000 mg | ORAL | Status: DC | PRN

## 2020-01-28 MED ORDER — ASPIRIN 325 MG PO TBEC: 325.0000 mg | DELAYED_RELEASE_TABLET | Freq: Every day | ORAL | 0 refills | Status: DC

## 2020-01-28 MED ORDER — SENNOSIDES-DOCUSATE SODIUM 8.6-50 MG PO TABS: 1.0000 | ORAL_TABLET | Freq: Every evening | ORAL | Status: DC | PRN

## 2020-01-28 MED ORDER — ASPIRIN EC 325 MG PO TBEC
325.0000 mg | DELAYED_RELEASE_TABLET | Freq: Every day | ORAL | Status: DC
  Administered 2020-01-29 – 2020-02-13 (×16): 325 mg via ORAL
  Filled 2020-01-28 (×16): qty 1

## 2020-01-28 MED ORDER — LISINOPRIL 20 MG PO TABS
20.0000 mg | ORAL_TABLET | Freq: Every day | ORAL | Status: DC
  Administered 2020-01-29 – 2020-02-13 (×16): 20 mg via ORAL
  Filled 2020-01-28 (×16): qty 1

## 2020-01-28 MED ORDER — LIDOCAINE-EPINEPHRINE 1 %-1:100000 IJ SOLN
INTRAMUSCULAR | Status: AC
  Filled 2020-01-28: qty 1

## 2020-01-28 MED ORDER — HYDROCODONE-ACETAMINOPHEN 5-325 MG PO TABS
1.0000 | ORAL_TABLET | Freq: Once | ORAL | Status: AC
  Administered 2020-01-28: 1 via ORAL
  Filled 2020-01-28: qty 1

## 2020-01-28 MED ORDER — THIAMINE HCL 100 MG PO TABS: 100.0000 mg | ORAL_TABLET | Freq: Every day | ORAL | Status: DC

## 2020-01-28 MED ORDER — ADULT MULTIVITAMIN W/MINERALS CH
1.0000 | ORAL_TABLET | Freq: Every day | ORAL | Status: DC
  Administered 2020-01-29 – 2020-02-13 (×16): 1 via ORAL
  Filled 2020-01-28 (×16): qty 1

## 2020-01-28 MED ORDER — ENOXAPARIN SODIUM 40 MG/0.4ML ~~LOC~~ SOLN
40.0000 mg | SUBCUTANEOUS | Status: DC
  Administered 2020-01-29 – 2020-02-13 (×16): 40 mg via SUBCUTANEOUS
  Filled 2020-01-28 (×16): qty 0.4

## 2020-01-28 MED ORDER — ATORVASTATIN CALCIUM 10 MG PO TABS
20.0000 mg | ORAL_TABLET | Freq: Every day | ORAL | Status: DC
  Administered 2020-01-29 – 2020-02-12 (×15): 20 mg via ORAL
  Filled 2020-01-28 (×16): qty 2

## 2020-01-28 MED ORDER — ACETAMINOPHEN 325 MG PO TABS
650.0000 mg | ORAL_TABLET | ORAL | Status: DC | PRN
  Administered 2020-01-29 – 2020-02-13 (×14): 650 mg via ORAL
  Filled 2020-01-28 (×15): qty 2

## 2020-01-28 MED ORDER — ATORVASTATIN CALCIUM 20 MG PO TABS: 20.0000 mg | ORAL_TABLET | Freq: Every day | ORAL | Status: DC

## 2020-01-28 MED ORDER — CLOPIDOGREL BISULFATE 75 MG PO TABS: 75.0000 mg | ORAL_TABLET | Freq: Every day | ORAL | Status: DC

## 2020-01-28 MED ORDER — ENOXAPARIN SODIUM 40 MG/0.4ML ~~LOC~~ SOLN: 40.0000 mg | SUBCUTANEOUS | Status: DC

## 2020-01-28 MED ORDER — ACETAMINOPHEN 650 MG RE SUPP: 650.0000 mg | RECTAL | Status: DC | PRN

## 2020-01-28 MED ORDER — ONDANSETRON HCL 4 MG PO TABS: 4.0000 mg | ORAL_TABLET | Freq: Three times a day (TID) | ORAL | Status: DC | PRN

## 2020-01-28 MED ORDER — CLOPIDOGREL BISULFATE 75 MG PO TABS
75.0000 mg | ORAL_TABLET | Freq: Every day | ORAL | Status: DC
  Administered 2020-01-29 – 2020-02-13 (×16): 75 mg via ORAL
  Filled 2020-01-28 (×16): qty 1

## 2020-01-28 MED ORDER — LISINOPRIL 20 MG PO TABS: 20.0000 mg | ORAL_TABLET | Freq: Every day | ORAL | Status: DC

## 2020-01-28 MED ORDER — TRAZODONE HCL 50 MG PO TABS
100.0000 mg | ORAL_TABLET | Freq: Every day | ORAL | Status: DC
  Administered 2020-01-28 – 2020-02-12 (×16): 100 mg via ORAL
  Filled 2020-01-28 (×16): qty 2

## 2020-01-28 MED ORDER — TRAZODONE HCL 100 MG PO TABS: 100.0000 mg | ORAL_TABLET | Freq: Every day | ORAL | Status: DC

## 2020-01-28 MED ORDER — ADULT MULTIVITAMIN W/MINERALS CH: 1.0000 | ORAL_TABLET | Freq: Every day | ORAL | Status: DC

## 2020-01-28 MED ORDER — SORBITOL 70 % SOLN: 30.0000 mL | Freq: Every day | Status: DC | PRN

## 2020-01-28 SURGICAL SUPPLY — 2 items
MONITOR REVEAL LINQ II (Prosthesis & Implant Heart) ×1 IMPLANT
PACK LOOP INSERTION (CUSTOM PROCEDURE TRAY) ×2 IMPLANT

## 2020-01-28 NOTE — Consult Note (Addendum)
ELECTROPHYSIOLOGY CONSULT NOTE  Patient ID: Ruben Chavez MRN: [id number], DOB/AGE: [birth year]   Admit date: 01/24/2020 Date of Consult: 01/28/2020  Primary Physician: Isaac Bliss, Rayford Halsted, MD Primary Cardiologist: No primary care provider on file.  Primary Electrophysiologist: New to None  Reason for Consultation: Cryptogenic stroke; recommendations regarding Implantable Loop Recorder Insurance: [employer]  History of Present Illness EP has been asked to evaluate Ruben Chavez for placement of an implantable loop recorder to monitor for atrial fibrillation by Dr Erlinda Hong.  The patient was admitted on 01/24/2020 with L sided weakness, hemisensory loss, facial droop, visual field cut, dysarthria and anosognosia. Taken to IR for R MCA occlusion. They first developed symptoms while at home.  Imaging demonstrated R MCA occlusion. S/p tPA and IR with TICI2b racanalizaiton.  They have undergone workup for stroke including echocardiogram and CTA Head and Neck.  The patient has been monitored on telemetry which has demonstrated sinus rhythm with no arrhythmias.  Inpatient stroke work-up may require a TEE per Neurology. Awaiting final decision  Echocardiogram this admission demonstrated 60-65%.  Lab work is reviewed.  Prior to admission, the patient denies chest pain, shortness of breath, dizziness, palpitations, or syncope.  They are recovering from their stroke with plans to attend CIR  at discharge.  Past Medical History:  Diagnosis Date  . Elevated PSA   . Hyperlipidemia    pt says not anymore  . Hypertension   . Prostate cancer [facility]) 2013     Surgical History:  Past Surgical History:  Procedure Laterality Date  . bilateral inguinal hernia  2008  . CYST REMOVAL TRUNK  2016   sebaceous cyst  . CYSTOSCOPY  04/22/2016   Procedure: CYSTOSCOPY;  Surgeon: [name], MD;  Location: [facility];  Service: Urology;;  . KNEE ARTHROSCOPY  2007   right  .  PROSTATE BIOPSY  2008  . PROSTATE BIOPSY  2013  . PROSTATE BIOPSY  01/28/2016  . RADIOACTIVE SEED IMPLANT N/A 04/22/2016   Procedure: RADIOACTIVE SEED IMPLANT/BRACHYTHERAPY IMPLANT;  Surgeon: [name], MD;  Location: [facility];  Service: Urology;  Laterality: N/A;  . RADIOLOGY WITH ANESTHESIA N/A 01/24/2020   Procedure: IR WITH ANESTHESIA;  Surgeon: [name], MD;  Location: [place];  Service: Radiology;  Laterality: N/A;  . TONSILLECTOMY    . TOTAL HIP ARTHROPLASTY  2001   left  . TOTAL HIP ARTHROPLASTY Right 01/18/2018   Procedure: RIGHT TOTAL HIP ARTHROPLASTY ANTERIOR APPROACH;  Surgeon: [name], MD;  Location: [facility];  Service: Orthopedics;  Laterality: Right;     Medications Prior to Admission  Medication Sig Dispense Refill Last Dose  . cephALEXin (KEFLEX) 500 MG capsule Take 2,000 mg by mouth See admin instructions. Take 2,000 mg by mouth one hour prior to dental appointment   01/15/2020 at Unknown time  . HYDROcodone-acetaminophen (NORCO/VICODIN) 5-325 MG tablet Take 1 tablet by mouth every 6 (six) hours as needed for moderate pain. 30 tablet 0 unk at unk  . lisinopril-hydrochlorothiazide (ZESTORETIC) 20-25 MG tablet Take 1 tablet by mouth daily. 90 tablet 1 SEE NOTE at unk  . methocarbamol (ROBAXIN) 500 MG tablet Take 1 tablet (500 mg total) by mouth every 8 (eight) hours as needed for muscle spasms. 30 tablet 0 unk at unk    Inpatient Medications:  . aspirin EC  325 mg Oral Daily  . atorvastatin  20 mg Oral q1800  . clopidogrel  75 mg Oral Daily  . enoxaparin (LOVENOX)  injection  40 mg Subcutaneous Q24H  . folic acid  1 mg Oral Daily  . lisinopril  20 mg Oral Daily  . multivitamin with minerals  1 tablet Oral Daily  . pantoprazole  40 mg Oral Daily  . thiamine  100 mg Oral Daily  . traZODone  100 mg Oral QHS    Allergies:  Allergies  Allergen Reactions  . Sulfa Antibiotics Swelling and Other (See Comments)    Swelling in ankles   .  Advil [Ibuprofen] Other (See Comments)    "Dots on chest" (Petechiae)  . Aleve [Naproxen] Other (See Comments)    "Dots on chest" (Petechiae)  . Betadine [Povidone Iodine] Itching and Rash  . Chloroxylenol (Antiseptic) Itching, Rash and Other (See Comments)    PCMX surgical sterilizing scrub  . Poison Ivy Extract Rash  . Povidone-Iodine Itching and Rash    BETADINE    Social History   Socioeconomic History  . Marital status: Married    Spouse name: Not on file  . Number of children: Not on file  . Years of education: Not on file  . Highest education level: Not on file  Occupational History  . Not on file  Tobacco Use  . Smoking status: Former Smoker    Packs/day: 1.00    Years: 32.00    Pack years: 32.00    Types: Cigarettes    Quit date: 06/06/1999    Years since quitting: 20.6  . Smokeless tobacco: Never Used  Substance and Sexual Activity  . Alcohol use: Yes    Alcohol/week: 14.0 standard drinks    Types: 14 Shots of liquor per week  . Drug use: No  . Sexual activity: Yes  Other Topics Concern  . Not on file  Social History Narrative  . Not on file   Social Determinants of Health   Financial Resource Strain:   . Difficulty of Paying Living Expenses: Not on file  Food Insecurity:   . Worried About Charity fundraiser in the Last Year: Not on file  . Ran Out of Food in the Last Year: Not on file  Transportation Needs:   . Lack of Transportation (Medical): Not on file  . Lack of Transportation (Non-Medical): Not on file  Physical Activity:   . Days of Exercise per Week: Not on file  . Minutes of Exercise per Session: Not on file  Stress:   . Feeling of Stress : Not on file  Social Connections:   . Frequency of Communication with Friends and Family: Not on file  . Frequency of Social Gatherings with Friends and Family: Not on file  . Attends Religious Services: Not on file  . Active Member of Clubs or Organizations: Not on file  . Attends Theatre manager Meetings: Not on file  . Marital Status: Not on file  Intimate Partner Violence:   . Fear of Current or Ex-Partner: Not on file  . Emotionally Abused: Not on file  . Physically Abused: Not on file  . Sexually Abused: Not on file     Family History  Problem Relation Age of Onset  . Pancreatic cancer Mother   . Heart disease Father   . Colon cancer Neg Hx       Review of Systems: All other systems reviewed and are otherwise negative except as noted above.  Physical Exam: Vitals:   01/27/20 2026 01/27/20 2356 01/28/20 0329 01/28/20 0803  BP: (!) 153/83 (!) 126/53 129/60 128/73  Pulse: 76  80 76 (!) 59  Resp: 18 16 16 14   Temp: 98.2 F (36.8 C) 99.5 F (37.5 C) 99 F (37.2 C) 98.2 F (36.8 C)  TempSrc: Oral Oral Oral Oral  SpO2: 100% 100% 99% 99%  Weight:      Height:        GEN- The patient is well appearing, alert and oriented x 3 today.   Head- normocephalic, atraumatic Eyes-  Sclera clear, conjunctiva pink Ears- hearing intact Oropharynx- clear Neck- supple Lungs- Clear to ausculation bilaterally, normal work of breathing Heart- Regular rate and rhythm, no murmurs, rubs or gallops  GI- soft, NT, ND, + BS Extremities- no clubbing, cyanosis, or edema MS- no significant deformity or atrophy Skin- no rash or lesion Psych- euthymic mood, full affect   Labs:   Lab Results  Component Value Date   WBC 6.1 01/28/2020   HGB 9.9 (L) 01/28/2020   HCT 29.4 (L) 01/28/2020   MCV 96.1 01/28/2020   PLT 169 01/28/2020    Recent Labs  Lab 01/24/20 2006 01/24/20 2032 01/28/20 0400  NA 139   < > 138  K 3.5   < > 3.7  CL 104   < > 108  CO2 20*   < > 24  BUN 17   < > 11  CREATININE 0.95   < > 0.90  CALCIUM 9.7   < > 8.0*  PROT 7.3  --   --   BILITOT 1.1  --   --   ALKPHOS 40  --   --   ALT 55*  --   --   AST 50*  --   --   GLUCOSE 103*   < > 111*   < > = values in this interval not displayed.     Radiology/Studies: CT Code Stroke CTA Head W/WO  contrast  Result Date: 01/24/2020 CLINICAL DATA:  Left-sided weakness.  Neglect. EXAM: CT ANGIOGRAPHY HEAD AND NECK TECHNIQUE: Multidetector CT imaging of the head and neck was performed using the standard protocol during bolus administration of intravenous contrast. Multiplanar CT image reconstructions and MIPs were obtained to evaluate the vascular anatomy. Carotid stenosis measurements (when applicable) are obtained utilizing NASCET criteria, using the distal internal carotid diameter as the denominator. CONTRAST:  27mL OMNIPAQUE IOHEXOL 350 MG/ML SOLN COMPARISON:  Head CT earlier same day FINDINGS: CTA NECK FINDINGS Aortic arch: Aortic atherosclerotic calcification. No aneurysm or dissection. Right carotid system: Right common carotid artery widely patent to the bifurcation. Calcified plaque at the carotid bifurcation and ICA bulb but no stenosis. Cervical ICA mildly irregular but patent to the skull base. Left carotid system: Common carotid artery widely patent to the bifurcation. Extensive calcified plaque at the carotid bifurcation and ICA bulb but no stenosis. Cervical ICA widely patent to the skull base. Vertebral arteries: Calcified plaque at the right vertebral artery origin with 30% stenosis. Left vertebral artery origin widely patent. Both vertebral arteries widely patent beyond that through the cervical region to the foramen magnum. Skeleton: Ordinary spondylosis. Other neck: No mass or lymphadenopathy. Upper chest: Normal Review of the MIP images confirms the above findings CTA HEAD FINDINGS Anterior circulation: Both internal carotid arteries are patent through the skull base and siphon regions. There is atherosclerotic calcification in the carotid siphon regions but no stenosis greater than 30%. On the left, the anterior and middle cerebral vessels appear normal. On the right, there is embolic occlusion of the superior segment M2 branch. Posterior circulation: Both vertebral arteries are patent  through the foramen magnum to the basilar. There is atherosclerotic disease in both V4 segments with stenoses estimated at 30-50%. No basilar stenosis. Posterior circulation branch vessels are patent. Venous sinuses: Patent and normal. Anatomic variants: None significant. Review of the MIP images confirms the above findings IMPRESSION: Embolic occlusion of the superior division M2 branch on the right. Atherosclerotic disease at both carotid bifurcation regions but no sign of flow limiting stenosis. On the initial CT, I probably underestimated the aspects score. I think there are some early changes higher than the temporal lobe, aspects probably 8 rather than 9. Case discussed with Dr. Cheral Marker during interpretation. Electronically Signed   By: Nelson Chimes M.D.   On: 01/24/2020 20:44   CT Code Stroke CTA Neck W/WO contrast  Result Date: 01/24/2020 CLINICAL DATA:  Left-sided weakness.  Neglect. EXAM: CT ANGIOGRAPHY HEAD AND NECK TECHNIQUE: Multidetector CT imaging of the head and neck was performed using the standard protocol during bolus administration of intravenous contrast. Multiplanar CT image reconstructions and MIPs were obtained to evaluate the vascular anatomy. Carotid stenosis measurements (when applicable) are obtained utilizing NASCET criteria, using the distal internal carotid diameter as the denominator. CONTRAST:  55mL OMNIPAQUE IOHEXOL 350 MG/ML SOLN COMPARISON:  Head CT earlier same day FINDINGS: CTA NECK FINDINGS Aortic arch: Aortic atherosclerotic calcification. No aneurysm or dissection. Right carotid system: Right common carotid artery widely patent to the bifurcation. Calcified plaque at the carotid bifurcation and ICA bulb but no stenosis. Cervical ICA mildly irregular but patent to the skull base. Left carotid system: Common carotid artery widely patent to the bifurcation. Extensive calcified plaque at the carotid bifurcation and ICA bulb but no stenosis. Cervical ICA widely patent to the  skull base. Vertebral arteries: Calcified plaque at the right vertebral artery origin with 30% stenosis. Left vertebral artery origin widely patent. Both vertebral arteries widely patent beyond that through the cervical region to the foramen magnum. Skeleton: Ordinary spondylosis. Other neck: No mass or lymphadenopathy. Upper chest: Normal Review of the MIP images confirms the above findings CTA HEAD FINDINGS Anterior circulation: Both internal carotid arteries are patent through the skull base and siphon regions. There is atherosclerotic calcification in the carotid siphon regions but no stenosis greater than 30%. On the left, the anterior and middle cerebral vessels appear normal. On the right, there is embolic occlusion of the superior segment M2 branch. Posterior circulation: Both vertebral arteries are patent through the foramen magnum to the basilar. There is atherosclerotic disease in both V4 segments with stenoses estimated at 30-50%. No basilar stenosis. Posterior circulation branch vessels are patent. Venous sinuses: Patent and normal. Anatomic variants: None significant. Review of the MIP images confirms the above findings IMPRESSION: Embolic occlusion of the superior division M2 branch on the right. Atherosclerotic disease at both carotid bifurcation regions but no sign of flow limiting stenosis. On the initial CT, I probably underestimated the aspects score. I think there are some early changes higher than the temporal lobe, aspects probably 8 rather than 9. Case discussed with Dr. Cheral Marker during interpretation. Electronically Signed   By: Nelson Chimes M.D.   On: 01/24/2020 20:44   MR BRAIN WO CONTRAST  Result Date: 01/25/2020 CLINICAL DATA:  69 year old male code stroke presentation yesterday, received IV tPA. Right MCA M2 occlusion treated with endovascular thrombectomy. EXAM: MRI HEAD WITHOUT CONTRAST TECHNIQUE: Multiplanar, multiecho pulse sequences of the brain and surrounding structures were  obtained without intravenous contrast. COMPARISON:  CT head, CTA and CTP yesterday. FINDINGS: Brain: Patchy,  confluent posterior right temporal lobe gray and white matter restricted diffusion corresponding to that evident by plain CT yesterday. Superimposed similar confluent infarct in the right operculum (series 7, image 52). Some involvement of the insula. Restricted diffusion here also tracks cephalad along the perirolandic cortex to the level of upper extremity representation. Additional smaller area of right superior frontal gyrus pre motor cortical restriction. Additional focus of right corona radiata involvement. T2 and FLAIR hyperintensity with petechial hemorrhage in the posterior right temporal lobe (Heidelberg Classification 1 A series 12, image 28). No other cerebral blood products identified. No significant mass effect. Small area of what appears to be chronic cortical encephalomalacia in the right parietal lobe on series 9, image 18. Pearline Cables and white matter signal in the left hemisphere and posterior fossa is normal for age. No midline shift, ventriculomegaly, extra-axial collection. Cervicomedullary junction and pituitary are within normal limits. Vascular: Major intracranial vascular flow voids are preserved. Skull and upper cervical spine: Partially visible cervical spine degeneration at C3-C4. Visualized bone marrow signal is within normal limits. Sinuses/Orbits: Negative orbits. Mucosal thickening and small fluid levels in the ethmoid and sphenoid sinuses. Other: Trace mastoid fluid. Visible internal auditory structures appear normal. Incidental benign appearing right occipital scalp lipoma (series 14, image 6). IMPRESSION: 1. Patchy and scattered acute right MCA territory infarct. Heidelberg Classification 1a petechial blood in the right temporal lobe. No significant mass effect. 2. Suggestion of a small area of chronic right parietal lobe cortical encephalomalacia. Negative for age noncontrast MRI  appearance of the brain elsewhere. Electronically Signed   By: Genevie Ann M.D.   On: 01/25/2020 21:26   CT CEREBRAL PERFUSION W CONTRAST  Result Date: 01/24/2020 CLINICAL DATA:  Left-sided weakness and neglect. EXAM: CT PERFUSION BRAIN TECHNIQUE: Multiphase CT imaging of the brain was performed following IV bolus contrast injection. Subsequent parametric perfusion maps were calculated using RAPID software. CONTRAST:  23mL OMNIPAQUE IOHEXOL 350 MG/ML SOLN COMPARISON:  CT head and CT angiography earlier same day. FINDINGS: CT Brain Perfusion Findings: CBF (<30%) Volume: 79mL Perfusion (Tmax>6.0s) volume: 153mL Mismatch Volume: 72mL ASPECTS on noncontrast CT Head: 8 at 2030 hours today. Infarct Core: 46 mL demonstrated in the frontoparietal brain. Completed infarction in the right temporal lobe not shown because of luxury perfusion. Infarction Location:Completed infarction in the right temporal lobe with luxury perfusion. New or completed infarction in the right frontoparietal brain. Additional 72 cc of at risk brain in the right MCA territory. IMPRESSION: Early subacute infarction in the right temporal lobe with pseudo normalization. Acute infarction at the right frontoparietal junction, 46 cc volume. Additional 72 cc of at risk brain in the right MCA territory. Results reported to Dr. Cheral Marker by text message at approximately 2055 hours. Electronically Signed   By: Nelson Chimes M.D.   On: 01/24/2020 21:03   ECHOCARDIOGRAM COMPLETE  Result Date: 01/25/2020    ECHOCARDIOGRAM REPORT   Patient Name:   DUANNE YALE Date of Exam: 01/25/2020 Medical Rec #:  [id number]         Height:       69.0 in Accession #:    [id number]        Weight:       180.0 lb Date of Birth:  09-21-1951          BSA:          1.98 m Patient Age:    26 years          BP:  119/53 mmHg Patient Gender: M                 HR:           60 bpm. Exam Location:  Inpatient Procedure: 2D Echo Indications:    Stroke 434.91 / I163.9  History:         Patient has no prior history of Echocardiogram examinations.                 Signs/Symptoms:Hypotension; Risk Factors:Hypertension and                 Dyslipidemia.  Sonographer:    Vikki Ports Turrentine Referring Phys: [phone] ERIC LINDZEN  Sonographer Comments: Echo performed with patient supine and on artificial respirator. IMPRESSIONS  1. Left ventricular ejection fraction, by estimation, is 60 to 65%. The left ventricle has normal function. LV endocardial border not optimally defined to evaluate regional wall motion. Left ventricular diastolic parameters are consistent with Grade I diastolic dysfunction (impaired relaxation).  2. Right ventricular systolic function is normal. The right ventricular size is normal. Tricuspid regurgitation signal is inadequate for assessing PA pressure.  3. The mitral valve is grossly normal. No evidence of mitral valve regurgitation. No evidence of mitral stenosis.  4. The aortic valve is tricuspid. Aortic valve regurgitation is not visualized. No aortic stenosis is present. Comparison(s): No prior Echocardiogram. FINDINGS  Left Ventricle: Left ventricular ejection fraction, by estimation, is 60 to 65%. The left ventricle has normal function. LV endocardial border not optimally defined to evaluate regional wall motion. The left ventricular internal cavity size was normal in size. There is no left ventricular hypertrophy. Left ventricular diastolic parameters are consistent with Grade I diastolic dysfunction (impaired relaxation). Right Ventricle: The right ventricular size is normal. No increase in right ventricular wall thickness. Right ventricular systolic function is normal. Tricuspid regurgitation signal is inadequate for assessing PA pressure. Left Atrium: Left atrial size was normal in size. Right Atrium: Right atrial size was normal in size. Pericardium: There is no evidence of pericardial effusion. Mitral Valve: The mitral valve is grossly normal. No evidence of mitral  valve regurgitation. No evidence of mitral valve stenosis. Tricuspid Valve: The tricuspid valve is grossly normal. Tricuspid valve regurgitation is not demonstrated. Aortic Valve: The aortic valve is tricuspid. Aortic valve regurgitation is not visualized. No aortic stenosis is present. Pulmonic Valve: The pulmonic valve was grossly normal. Pulmonic valve regurgitation is not visualized. Aorta: The aortic root is normal in size and structure. Venous: The inferior vena cava was not well visualized. IAS/Shunts: No atrial level shunt detected by color flow Doppler.  LEFT VENTRICLE PLAX 2D LVIDd:         3.79 cm  Diastology LVIDs:         2.87 cm  LV e' lateral:   13.50 cm/s LV PW:         0.96 cm  LV E/e' lateral: 5.1 LV IVS:        1.00 cm  LV e' medial:    9.90 cm/s LVOT diam:     2.10 cm  LV E/e' medial:  6.9 LV SV:         89.71 ml LV SV Index:   15.03 LVOT Area:     3.46 cm  RIGHT VENTRICLE RV S prime:     20.50 cm/s TAPSE (M-mode): 2.1 cm RIGHT ATRIUM           Index RA Area:     15.30 cm RA Volume:  35.00 ml  17.72 ml/m  AORTIC VALVE LVOT Vmax:   118.00 cm/s LVOT Vmean:  90.000 cm/s LVOT VTI:    0.259 m  AORTA Ao Root diam: 3.60 cm MITRAL VALVE MV Area (PHT): 2.76 cm    SHUNTS MV Decel Time: 275 msec    Systemic VTI:  0.26 m MV E velocity: 68.60 cm/s  Systemic Diam: 2.10 cm MV A velocity: 85.30 cm/s MV E/A ratio:  0.80 Eleonore Chiquito MD Electronically signed by Eleonore Chiquito MD Signature Date/Time: 01/25/2020/4:26:55 PM    Final    CT HEAD CODE STROKE WO CONTRAST  Addendum Date: 01/24/2020   ADDENDUM REPORT: 01/24/2020 20:49 ADDENDUM: I think that the initial interpretation underestimated the disease. Whereas there is a subacute infarction in the temporal lobe, there are probably more acute changes with loss of gray-white differentiation above that in the frontoparietal region, making the aspects 8 rather than 9. Electronically Signed   By: Nelson Chimes M.D.   On: 01/24/2020 20:49   Result Date:  01/24/2020 CLINICAL DATA:  Code stroke.  Left-sided weakness. EXAM: CT HEAD WITHOUT CONTRAST TECHNIQUE: Contiguous axial images were obtained from the base of the skull through the vertex without intravenous contrast. COMPARISON:  None. FINDINGS: Brain: Subacute infarction in the right lateral temporal lobe. Mild swelling but no hemorrhage. No other acute finding. Elsewhere, there are mild chronic small-vessel changes of the white matter. No mass, hydrocephalus or extra-axial collection. Vascular: There is atherosclerotic calcification of the major vessels at the base of the brain. Skull: Negative Sinuses/Orbits: Clear/normal Other: None ASPECTS (Barrera Stroke Program Early CT Score) - Ganglionic level infarction (caudate, lentiform nuclei, internal capsule, insula, M1-M3 cortex): 6 - Supraganglionic infarction (M4-M6 cortex): 3 Total score (0-10 with 10 being normal): 9 IMPRESSION: 1. Subacute infarction in the right lateral temporal lobe. No hemorrhage or mass effect. 2. ASPECTS is 9 3. These results were communicated to Dr. Cheral Marker at [place] 2/18/2021by text page via the [facility] messaging system. Electronically Signed: By: Nelson Chimes M.D. On: 01/24/2020 20:22   VAS [place] LOWER EXTREMITY VENOUS (DVT)  Result Date: 01/26/2020  Lower Venous DVTStudy Indications: Stroke.  Comparison Study: No prior study on file for comparison Performing Technologist: Sharion Dove RVS  Examination Guidelines: A complete evaluation includes B-mode imaging, spectral Doppler, color Doppler, and power Doppler as needed of all accessible portions of each vessel. Bilateral testing is considered an integral part of a complete examination. Limited examinations for reoccurring indications may be performed as noted. The reflux portion of the exam is performed with the patient in reverse Trendelenburg.  +---------+---------------+---------+-----------+----------+--------------+ RIGHT     CompressibilityPhasicitySpontaneityPropertiesThrombus Aging +---------+---------------+---------+-----------+----------+--------------+ CFV      Full           Yes      Yes                                 +---------+---------------+---------+-----------+----------+--------------+ SFJ      Full                                                        +---------+---------------+---------+-----------+----------+--------------+ FV Prox  Full                                                        +---------+---------------+---------+-----------+----------+--------------+  FV Mid   Full                                                        +---------+---------------+---------+-----------+----------+--------------+ FV DistalFull                                                        +---------+---------------+---------+-----------+----------+--------------+ PFV      Full                                                        +---------+---------------+---------+-----------+----------+--------------+ POP      Full           Yes      Yes                                 +---------+---------------+---------+-----------+----------+--------------+ PTV      Full                                                        +---------+---------------+---------+-----------+----------+--------------+ PERO     Full                                                        +---------+---------------+---------+-----------+----------+--------------+   +---------+---------------+---------+-----------+----------+--------------+ LEFT     CompressibilityPhasicitySpontaneityPropertiesThrombus Aging +---------+---------------+---------+-----------+----------+--------------+ CFV      Full           Yes      Yes                                 +---------+---------------+---------+-----------+----------+--------------+ SFJ      Full                                                         +---------+---------------+---------+-----------+----------+--------------+ FV Prox  Full                                                        +---------+---------------+---------+-----------+----------+--------------+ FV Mid   Full                                                        +---------+---------------+---------+-----------+----------+--------------+  FV DistalFull                                                        +---------+---------------+---------+-----------+----------+--------------+ PFV      Full                                                        +---------+---------------+---------+-----------+----------+--------------+ POP      Full           Yes      Yes                                 +---------+---------------+---------+-----------+----------+--------------+ PTV      Full                                                        +---------+---------------+---------+-----------+----------+--------------+ PERO     Full                                                        +---------+---------------+---------+-----------+----------+--------------+     Summary: BILATERAL: - No evidence of deep vein thrombosis seen in the lower extremities, bilaterally.   *See table(s) above for measurements and observations. Electronically signed by Deitra Mayo MD on 01/26/2020 at 4:57:01 PM.    Final     12-lead ECG 01/24/20 showed NSR 74 bpm (personally reviewed) All prior EKG's in EPIC reviewed with no documented atrial fibrillation  Telemetry NSR and ST 70-80s, 100s occasionally (personally reviewed)  Assessment and Plan:  1. Cryptogenic stroke The patient presents with cryptogenic stroke.  The patient is scheduled for a TEE this PM. Neuro to clarify need, as initially was not ordered. I spoke at length with the patient about monitoring for afib with an implantable loop recorder.  Risks, benefits, and alteratives to  implantable loop recorder were discussed with the patient today.   At this time, the patient is very clear in their decision to proceed with implantable loop recorder if not contraindicated by remaining work up.   Wound care was reviewed with the patient (keep incision clean and dry for 3 days).  Wound check scheduled and entered in AVS. Please call with questions.   Shirley Friar, PA-C 01/28/2020 9:37 AM  EP Attending  Patient seen and examined. Agree with the findings as noted above. The patient presents with a cryptogenic stroke. I have reviewed the indications/risks/benefits/goals/expectations of ILR insertion if his TEE is unrevealing. He wishes to proceed.  Mikle Bosworth.D.

## 2020-01-28 NOTE — CV Procedure (Signed)
TEE: Anesthesia:  Propofol  No SOE No LAA thrombus EF 60-65% Negative bubble study Trivial MR Mild TR No AS No effusion Mild aortic debris  Patient to proceed with ILR See full note in [facility]  Jenkins Rouge MD [facility]

## 2020-01-28 NOTE — Progress Notes (Signed)
Inpatient Rehabilitation Admissions Coordinator  I met with patient at bedside for rehab assessment. We discussed goals and expectations of an inpt rehab admit and he is in agreement. I will begin insurance authorization with [employer] for a possible admit today pending insurance approval and medical work up completion.  Danne Baxter, RN, MSN Rehab Admissions Coordinator [phone] 01/28/2020 11:12 AM  I met with patient's wife at bedside while pt at his TEE. She  Is in agreement to CIR if approved.  Danne Baxter, RN, MSN Rehab Admissions Coordinator [phone] 01/28/2020 2:34 PM

## 2020-01-28 NOTE — Progress Notes (Signed)
Inpatient Rehabilitation Admissions Coordinator  I have insurance approval and will make th arrangements to admit to CIR today.  Danne Baxter, RN, MSN Rehab Admissions Coordinator [phone] 01/28/2020 4:59 PM

## 2020-01-28 NOTE — Anesthesia Postprocedure Evaluation (Signed)
Anesthesia Post Note  Patient: Ruben Chavez  Procedure(s) Performed: TRANSESOPHAGEAL ECHOCARDIOGRAM (TEE) (N/A ) BUBBLE STUDY     Patient location during evaluation: Endoscopy Anesthesia Type: MAC Level of consciousness: awake and alert Pain management: pain level controlled Vital Signs Assessment: post-procedure vital signs reviewed and stable Respiratory status: spontaneous breathing, nonlabored ventilation, respiratory function stable and patient connected to nasal cannula oxygen Cardiovascular status: stable and blood pressure returned to baseline Postop Assessment: no apparent nausea or vomiting Anesthetic complications: no    Last Vitals:  Vitals:   01/28/20 1425 01/28/20 1639  BP: (!) 135/57 (!) 144/69  Pulse: 70 68  Resp: 16 16  Temp:  (!) 36.3 C  SpO2: 98%     Last Pain:  Vitals:   01/28/20 1639  TempSrc: Oral  PainSc:                  [name]

## 2020-01-28 NOTE — PMR Pre-admission (Signed)
PMR Admission Coordinator Pre-Admission Assessment  Patient: Ruben Chavez is an 69 y.o., male MRN: [id number] DOB: 1951-10-13 Height: 5\' 9"  (175.3 cm) Weight: 82.6 kg              Insurance Information HMO:     PPO: yes     PCP:      IPA:      80/20:      OTHER:  PRIMARY: [employer]      Policy#: [id number]      Subscriber: pt CM Name: Crystal   Phone#: [phone]     Fax#: Epic access Pre-Cert#: [id number]  Employer:  Benefits:  Phone #: [phone]     Name: 2/22 Eff. Date: 12/07/2019     Deduct: none      Out of Pocket Max: $3400      Life Max: none CIR: $295 co pay per day days 1 until 6      SNF: no co pay per day days 1 until 20; $178 co pay per day days 21 until 100 Outpatient: $15 per visit     Co-Pay: visits per medical neccesity Home Health: 100%      Co-Pay: visits per medical neccesity DME: 80%     Co-Pay: 20% Providers: in network  SECONDARY: none        Medicaid Application Date:       Case Manager:  Disability Application Date:       Case Worker:   The "Data Collection Information Summary" for patients in Inpatient Rehabilitation Facilities with attached "Privacy Act [place] Records" was provided and verbally reviewed with: Patient and Family  Emergency Contact Information Contact Information    Name Relation Home Work Mobile   Cordone,Katherine Spouse [phone]       Current Medical History  Patient Admitting Diagnosis: CVA  History of Present Illness:  69 y.o. right-handed male with history of hyperlipidemia, hypertension, prostate cancer radioactive seed implant, remote tobacco abuse.   Presented 01/24/2020 with acute onset of left-sided weakness facial droop and dysarthria.  Cranial CT scan showed subacute infarction right lateral temporal lobe.  No hemorrhage or mass-effect.  CT angiogram of head and neck embolic occlusion of the superior division M2 branch on the right.  Patient did receive TPA.  Underwent revascularization  procedure per interventional radiology.  Echocardiogram with ejection fraction of 65% without emboli.  Venous Doppler studies negative.  Neurology consulted maintain on aspirin and Plavix for CVA prophylaxis.  Subcutaneous Lovenox for DVT prophylaxis.  TEE showed ejection fraction 65% negative bubble study without thrombus.  Loop recorder placed 01/28/2020.  Patient did have occasional bouts of orthostatic hypotension blood pressure in the 60s on 01/26/2020 with conservative care and monitored when out of bed.    Complete NIHSS TOTAL: 7 Glasgow Coma Scale Score: 15  Past Medical History  Past Medical History:  Diagnosis Date  . Elevated PSA   . Hyperlipidemia    pt says not anymore  . Hypertension   . Prostate cancer ([place]) 2013    Family History  family history includes Heart disease in his father; Pancreatic cancer in his mother.  Prior Rehab/Hospitalizations:  Has the patient had prior rehab or hospitalizations prior to admission? Yes  Has the patient had major surgery during 100 days prior to admission? Yes  Current Medications   Current Facility-Administered Medications:  .  [MAR Hold] acetaminophen (TYLENOL) tablet 650 mg, 650 mg, Oral, Q4H PRN **OR** [MAR Hold] acetaminophen (TYLENOL) 160 MG/5ML solution 650 mg,  650 mg, Per Tube, Q4H PRN **OR** [MAR Hold] acetaminophen (TYLENOL) suppository 650 mg, 650 mg, Rectal, Q4H PRN, [name], MD .  Doug Sou Hold] acetaminophen (TYLENOL) tablet 650 mg, 650 mg, Oral, Q4H PRN, 650 mg at 01/27/20 2102 **OR** [MAR Hold] acetaminophen (TYLENOL) suppository 650 mg, 650 mg, Rectal, Q4H PRN, Kerney Elbe, MD .  Doug Sou Hold] aspirin EC tablet 325 mg, 325 mg, Oral, Daily, Rosalin Hawking, MD, 325 mg at 01/28/20 0947 .  [MAR Hold] atorvastatin (LIPITOR) tablet 20 mg, 20 mg, Oral, q1800, Rosalin Hawking, MD, 20 mg at 01/27/20 1712 .  [MAR Hold] clopidogrel (PLAVIX) tablet 75 mg, 75 mg, Oral, Daily, Rosalin Hawking, MD, 75 mg at 01/28/20 0947 .  [MAR Hold]  enoxaparin (LOVENOX) injection 40 mg, 40 mg, Subcutaneous, Q24H, Rosalin Hawking, MD, 40 mg at 01/28/20 0947 .  [MAR Hold] folic acid (FOLVITE) tablet 1 mg, 1 mg, Oral, Daily, Garvin Fila, MD, 1 mg at 01/28/20 0948 .  [MAR Hold] labetalol (NORMODYNE) injection 5-20 mg, 5-20 mg, Intravenous, Q2H PRN, Rosalin Hawking, MD .  Doug Sou Hold] lisinopril (ZESTRIL) tablet 20 mg, 20 mg, Oral, Daily, Rosalin Hawking, MD, 20 mg at 01/28/20 0947 .  [MAR Hold] midazolam (VERSED) injection 1-2 mg, 1-2 mg, Intravenous, Q2H PRN, Desai, Rahul P, PA-C .  [MAR Hold] multivitamin with minerals tablet 1 tablet, 1 tablet, Oral, Daily, Garvin Fila, MD, 1 tablet at 01/28/20 0947 .  [MAR Hold] pantoprazole (PROTONIX) EC tablet 40 mg, 40 mg, Oral, Daily, Rosalin Hawking, MD, 40 mg at 01/28/20 0947 .  [MAR Hold] senna-docusate (Senokot-S) tablet 1 tablet, 1 tablet, Oral, QHS PRN, Kerney Elbe, MD .  Doug Sou Hold] thiamine tablet 100 mg, 100 mg, Oral, Daily, Garvin Fila, MD, 100 mg at 01/28/20 0947 .  [MAR Hold] traZODone (DESYREL) tablet 100 mg, 100 mg, Oral, QHS, Kerney Elbe, MD, 100 mg at 01/27/20 2103  Patients Current Diet:  Diet Order            Diet Heart Room service appropriate? Yes; Fluid consistency: Thin  Diet effective now              Precautions / Restrictions Precautions Precautions: Fall Precaution Comments: poor insight into deficits Restrictions Weight Bearing Restrictions: No   Has the patient had 2 or more falls or a fall with injury in the past year?No  Prior Activity Level Community (5-7x/wk): Independent, active; recently retired  Prior Functional Level Prior Function Level of Independence: Independent Comments: Recently retired in July. ADLs and IADLs.  Self Care: Did the patient need help bathing, dressing, using the toilet or eating?  Independent  Indoor Mobility: Did the patient need assistance with walking from room to room (with or without device)? Independent  Stairs: Did the  patient need assistance with internal or external stairs (with or without device)? Independent  Functional Cognition: Did the patient need help planning regular tasks such as shopping or remembering to take medications? Independent  Home Assistive Devices / Equipment Home Equipment: Crutches  Prior Device Use: Indicate devices/aids used by the patient prior to current illness, exacerbation or injury? None of the above  Current Functional Level Cognition  Arousal/Alertness: Awake/alert Overall Cognitive Status: Impaired/Different from baseline Current Attention Level: Focused, Sustained Orientation Level: Oriented X4 Following Commands: Follows one step commands inconsistently Safety/Judgement: Decreased awareness of safety, Decreased awareness of deficits General Comments: pt highly distractable, especially when wife present and multiple people speaking. Pt benefiting from calm, direct cues. Attention: Focused, Sustained Focused Attention:  Appears intact Sustained Attention: Impaired Sustained Attention Impairment: Verbal complex Memory: Impaired Memory Impairment: Storage deficit, Retrieval deficit, Decreased recall of new information(Immediate: 5/5; delayed: 3/5) Awareness: Impaired Awareness Impairment: Intellectual impairment Problem Solving: (WNL for functional problem solving) Executive Function: Reasoning, Sequencing Reasoning: Impaired Reasoning Impairment: Verbal complex Sequencing: Appears intact Safety/Judgment: Appears intact    Extremity Assessment (includes Sensation/Coordination)  Upper Extremity Assessment: LUE deficits/detail LUE Deficits / Details: increased edema. Noting slight flexion tone at elbow and shoulder. No active movements at hand, wrist, and elbow. Pt with compensatory hiking of shoulder LUE Sensation: decreased light touch, decreased proprioception(Poor deep touch) LUE Coordination: decreased fine motor, decreased gross motor  Lower Extremity  Assessment: Defer to PT evaluation LLE Deficits / Details: pt taps L foot on the floor several times before placing it down to step, denies sensory deficits but appears to have diminished sensation LLE. hip flex 3+/5, knee ext 4+/5, knee flex 4+/5, no L knee buckling LLE Sensation: decreased proprioception LLE Coordination: decreased gross motor, decreased fine motor    ADLs  Overall ADL's : Needs assistance/impaired Eating/Feeding: Minimal assistance, Sitting Eating/Feeding Details (indicate cue type and reason): bilateral tasks Grooming: Moderate assistance, Sitting Grooming Details (indicate cue type and reason): Mod A for bilateral tasks Upper Body Bathing: Moderate assistance, Sitting Lower Body Bathing: Moderate assistance, Sit to/from stand Upper Body Dressing : Moderate assistance, Sitting Lower Body Dressing: Maximal assistance, Sit to/from stand Lower Body Dressing Details (indicate cue type and reason): Max A for donning socks Toilet Transfer: Minimal assistance, +2 for safety/equipment, Ambulation, RW(simulated to recliner) Toilet Transfer Details (indicate cue type and reason): Min A for balance nad RW management.  Functional mobility during ADLs: Minimal assistance, +2 for safety/equipment, Rolling walker General ADL Comments: Pt presenting with decreased cognition, balance, strength, functional use of LUE, and safety.    Mobility  Overal bed mobility: Needs Assistance Bed Mobility: Supine to Sit Supine to sit: Min assist General bed mobility comments: Pt able to bring BLEs towards EOB. Min A for elevating trunk    Transfers  Overall transfer level: Needs assistance Equipment used: Rolling walker (2 wheeled) Transfers: Sit to/from Stand Sit to Stand: Min assist General transfer comment: Min A for safety and balance once in standing    Ambulation / Gait / Stairs / Wheelchair Mobility  Ambulation/Gait Ambulation/Gait assistance: Min assist, +2 safety/equipment Gait  Distance (Feet): 20 Feet Assistive device: Rolling walker (2 wheeled) Gait Pattern/deviations: Step-to pattern General Gait Details: pt tapping L foot down on floor several times before putting wt on it or occasionally moving it fwd then bkwd. Easily overstimulated and confused by task.  Gait velocity: decreased Gait velocity interpretation: <1.31 ft/sec, indicative of household ambulator    Posture / Balance Balance Overall balance assessment: Needs assistance Sitting-balance support: No upper extremity supported, Feet supported Sitting balance-Leahy Scale: Fair Standing balance support: Bilateral upper extremity supported, During functional activity Standing balance-Leahy Scale: Poor Standing balance comment: needed UE support for safety    Special needs/care consideration BiPAP/CPAP CPM Continuous Drip IV Dialysis         Life Vest Oxygen Special Bed Trach Size Wound Vac Skin                              Bowel mgmt:continent Bladder mgmt:continent Diabetic mgmt Behavioral consideration  Wife reports pt with high anxiety and would like him to be considered to be put back on Wellbutrin which he used before  to quit smoking Chemo/radiation  Designated visitor is wife , Belenda Cruise; retired Therapist, sports Wife to have Annetta North 3/3 so children to assist with 24 7 assist at d/c LOOP placed 2/22   Previous Home Environment  Living Arrangements: Spouse/significant other  Lives With: Spouse Available Help at Discharge: Family, Available 24 hours/day(daughter, daughter in law and son to asisst at d/c) Type of Home: House Home Layout: Two level, 1/2 bath on main level Alternate Level Stairs-Rails: Right, Left Alternate Level Stairs-Number of Steps: 14 Home Access: Stairs to enter [employer] of Steps: 1 Bathroom Shower/Tub: Multimedia programmer: Standard Additional Comments: wife is home with him and she is retired, however, she is scheduled for THA on 02/06/20. Good family  support from children though too.   Discharge Living Setting Plans for Discharge Living Setting: Patient's home, Lives with (comment)(wife) Type of Home at Discharge: House Discharge Home Layout: Two level, 1/2 bath on main level, Bed/bath upstairs Alternate Level Stairs-Rails: Right, Left Alternate Level Stairs-Number of Steps: 14 Discharge Home Access: Stairs to enter Entrance Stairs-Rails: None Entrance Stairs-Number of Steps: 1 Discharge Bathroom Shower/Tub: Horticulturist, commercial: Standard Discharge Bathroom Accessibility: Yes How Accessible: Accessible via walker Does the patient have any problems obtaining your medications?: No  Social/Family/Support Systems Patient Roles: Spouse, Parent Contact Information: wife, Belenda Cruise Anticipated Caregiver: daughter, daughter in law and son; wife for THA 3/3 Anticipated Caregiver's Contact Information: see above Ability/Limitations of Caregiver: wife to have surgery 3/3 THA. CHildren to asisst Caregiver Availability: 24/7 Discharge Plan Discussed with Primary Caregiver: Yes Is Caregiver In Agreement with Plan?: No Does Caregiver/Family have Issues with Lodging/Transportation while Pt is in Rehab?: No  Goals/Additional Needs Patient/Family Goal for Rehab: Mod I with PT, OT, and SLP Expected length of stay: ELOS 10 to 14 days Pt/Family Agrees to Admission and willing to participate: Yes Program Orientation Provided & Reviewed with Pt/Caregiver Including Roles  & Responsibilities: Yes  Decrease burden of Care through IP rehab admission:   Possible need for SNF placement upon discharge:  Patient Condition: This patient's condition remains as documented in the consult dated 01/28/2020, in which the Rehabilitation Physician determined and documented that the patient's condition is appropriate for intensive rehabilitative care in an inpatient rehabilitation facility. Will admit to inpatient rehab today.  Preadmission  Screen Completed By:  Cleatrice Burke, RN, 01/28/2020 4:19 PM ______________________________________________________________________   Discussed status with Dr. Naaman Plummer on 01/28/2020 at 1620 and received approval for admission today.  Admission Coordinator:  Cleatrice Burke, time [id number] Date 01/28/2020

## 2020-01-28 NOTE — Discharge Instructions (Signed)

## 2020-01-28 NOTE — Consult Note (Signed)
Physical Medicine and Rehabilitation Consult Reason for Consult: Left side weakness Referring Physician: Dr.Xu   HPI: Ruben Chavez is a 69 y.o. right-handed male with history of hyperlipidemia, hypertension, prostate cancer with radioactive seed implant, remote tobacco abuse.  Per chart review patient lives with spouse independent prior to admission.  Two-level home half bath on main level one-step to entry.  Wife can assist as needed.  Presented 01/24/2020 with acute onset of left-sided weakness facial droop and dysarthria.  Cranial CT scan showed subacute infarction in the right lateral temporal lobe.  No hemorrhage or mass-effect.  CT angiogram of head and neck embolic occlusion of the superior division M2 branch on the right.  Patient did receive TPA.  Patient underwent revascularization procedure per interventional radiology.  Echocardiogram with ejection fraction of 65% without emboli.  Venous Doppler studies negative.  Neurology consulted currently on aspirin and Plavix for CVA prophylaxis.  Subcutaneous Lovenox for DVT prophylaxis.  Awaiting plan for possible loop recorder.  Patient with bouts of orthostatic hypotension BP down to the 60s 01/26/2020 with conservative care.  Therapy evaluations completed with recommendations of physical medicine rehab consult.   Review of Systems  Constitutional: Negative for chills and fever.  HENT: Negative for hearing loss.   Eyes: Negative for blurred vision and double vision.  Respiratory: Negative for cough and shortness of breath.   Cardiovascular: Negative for chest pain, palpitations and leg swelling.  Gastrointestinal: Positive for constipation. Negative for heartburn and nausea.  Genitourinary: Negative for dysuria, flank pain and hematuria.  Musculoskeletal: Positive for joint pain and myalgias.  Skin: Negative for rash.  Neurological: Positive for speech change and weakness.  All other systems reviewed and are negative.  Past  Medical History:  Diagnosis Date  . Elevated PSA   . Hyperlipidemia    pt says not anymore  . Hypertension   . Prostate cancer ([place]) 2013   Past Surgical History:  Procedure Laterality Date  . bilateral inguinal hernia  2008  . CYST REMOVAL TRUNK  2016   sebaceous cyst  . CYSTOSCOPY  04/22/2016   Procedure: CYSTOSCOPY;  Surgeon: [name], MD;  Location: [facility];  Service: Urology;;  . KNEE ARTHROSCOPY  2007   right  . PROSTATE BIOPSY  2008  . PROSTATE BIOPSY  2013  . PROSTATE BIOPSY  01/28/2016  . RADIOACTIVE SEED IMPLANT N/A 04/22/2016   Procedure: RADIOACTIVE SEED IMPLANT/BRACHYTHERAPY IMPLANT;  Surgeon: [name], MD;  Location: [facility];  Service: Urology;  Laterality: N/A;  . RADIOLOGY WITH ANESTHESIA N/A 01/24/2020   Procedure: IR WITH ANESTHESIA;  Surgeon: [name], MD;  Location: [place];  Service: Radiology;  Laterality: N/A;  . TONSILLECTOMY    . TOTAL HIP ARTHROPLASTY  2001   left  . TOTAL HIP ARTHROPLASTY Right 01/18/2018   Procedure: RIGHT TOTAL HIP ARTHROPLASTY ANTERIOR APPROACH;  Surgeon: [name], MD;  Location: [facility];  Service: Orthopedics;  Laterality: Right;   Family History  Problem Relation Age of Onset  . Pancreatic cancer Mother   . Heart disease Father   . Colon cancer Neg Hx    Social History:  reports that he quit smoking about 20 years ago. His smoking use included cigarettes. He has a 32.00 pack-year smoking history. He has never used smokeless tobacco. He reports current alcohol use of about 14.0 standard drinks of alcohol per week. He reports that he does not use drugs. Allergies:  Allergies  Allergen Reactions  .  Sulfa Antibiotics Swelling and Other (See Comments)    Swelling in ankles   . Advil [Ibuprofen] Other (See Comments)    "Dots on chest" (Petechiae)  . Aleve [Naproxen] Other (See Comments)    "Dots on chest" (Petechiae)  . Betadine [Povidone Iodine] Itching and Rash    . Chloroxylenol (Antiseptic) Itching, Rash and Other (See Comments)    PCMX surgical sterilizing scrub  . Poison Ivy Extract Rash  . Povidone-Iodine Itching and Rash    BETADINE   Medications Prior to Admission  Medication Sig Dispense Refill  . cephALEXin (KEFLEX) 500 MG capsule Take 2,000 mg by mouth See admin instructions. Take 2,000 mg by mouth one hour prior to dental appointment    . HYDROcodone-acetaminophen (NORCO/VICODIN) 5-325 MG tablet Take 1 tablet by mouth every 6 (six) hours as needed for moderate pain. 30 tablet 0  . lisinopril-hydrochlorothiazide (ZESTORETIC) 20-25 MG tablet Take 1 tablet by mouth daily. 90 tablet 1  . methocarbamol (ROBAXIN) 500 MG tablet Take 1 tablet (500 mg total) by mouth every 8 (eight) hours as needed for muscle spasms. 30 tablet 0    Home: Home Living Family/patient expects to be discharged to:: Private residence Living Arrangements: Spouse/significant other Available Help at Discharge: Family, Available 24 hours/day Type of Home: House Home Access: Stairs to enter [employer] of Steps: 1 Home Layout: Two level, 1/2 bath on main level Alternate Level Stairs-Number of Steps: 14 Alternate Level Stairs-Rails: Right, Left Bathroom Shower/Tub: Multimedia programmer: Standard Home Equipment: Crutches Additional Comments: wife is home with him and she is retired, however, she is scheduled for THA on 02/06/20. Good family support from children though too.   Lives With: Spouse  Functional History: Prior Function Level of Independence: Independent Comments: Recently retired in July. ADLs and IADLs. Functional Status:  Mobility: Bed Mobility Overal bed mobility: Needs Assistance Bed Mobility: Supine to Sit Supine to sit: Min assist General bed mobility comments: Pt able to bring BLEs towards EOB. Min A for elevating trunk Transfers Overall transfer level: Needs assistance Equipment used: Rolling walker (2  wheeled) Transfers: Sit to/from Stand Sit to Stand: Min assist General transfer comment: Min A for safety and balance once in standing Ambulation/Gait Ambulation/Gait assistance: Min assist, +2 safety/equipment Gait Distance (Feet): 20 Feet Assistive device: Rolling walker (2 wheeled) Gait Pattern/deviations: Step-to pattern General Gait Details: pt tapping L foot down on floor several times before putting wt on it or occasionally moving it fwd then bkwd. Easily overstimulated and confused by task.  Gait velocity: decreased Gait velocity interpretation: <1.31 ft/sec, indicative of household ambulator    ADL: ADL Overall ADL's : Needs assistance/impaired Eating/Feeding: Minimal assistance, Sitting Eating/Feeding Details (indicate cue type and reason): bilateral tasks Grooming: Moderate assistance, Sitting Grooming Details (indicate cue type and reason): Mod A for bilateral tasks Upper Body Bathing: Moderate assistance, Sitting Lower Body Bathing: Moderate assistance, Sit to/from stand Upper Body Dressing : Moderate assistance, Sitting Lower Body Dressing: Maximal assistance, Sit to/from stand Lower Body Dressing Details (indicate cue type and reason): Max A for donning socks Toilet Transfer: Minimal assistance, +2 for safety/equipment, Ambulation, RW(simulated to recliner) Toilet Transfer Details (indicate cue type and reason): Min A for balance nad RW management.  Functional mobility during ADLs: Minimal assistance, +2 for safety/equipment, Rolling walker General ADL Comments: Pt presenting with decreased cognition, balance, strength, functional use of LUE, and safety.  Cognition: Cognition Overall Cognitive Status: Impaired/Different from baseline Arousal/Alertness: Awake/alert Orientation Level: Oriented X4 Attention: Focused, Sustained Focused  Attention: Appears intact Sustained Attention: Impaired Sustained Attention Impairment: Verbal complex Memory: Impaired Memory  Impairment: Storage deficit, Retrieval deficit, Decreased recall of new information(Immediate: 5/5; delayed: 3/5) Awareness: Impaired Awareness Impairment: Intellectual impairment Problem Solving: (WNL for functional problem solving) Executive Function: Reasoning, Sequencing Reasoning: Impaired Reasoning Impairment: Verbal complex Sequencing: Appears intact Safety/Judgment: Appears intact Cognition Arousal/Alertness: Awake/alert Behavior During Therapy: Flat affect Overall Cognitive Status: Impaired/Different from baseline Area of Impairment: Attention, Memory, Following commands, Safety/judgement, Awareness, Problem solving Current Attention Level: Focused, Sustained Memory: Decreased short-term memory, Decreased recall of precautions Following Commands: Follows one step commands inconsistently Safety/Judgement: Decreased awareness of safety, Decreased awareness of deficits Awareness: Intellectual Problem Solving: Slow processing, Decreased initiation, Difficulty sequencing, Requires verbal cues, Requires tactile cues General Comments: pt highly distractable, especially when wife present and multiple people speaking. Pt benefiting from calm, direct cues.  Blood pressure 129/60, pulse 76, temperature 99 F (37.2 C), temperature source Oral, resp. rate 16, height 5\' 9"  (1.753 m), weight 81.6 kg, SpO2 99 %.  Physical Exam  General: Alert and oriented x 3, No apparent distress HEENT: Head is normocephalic, atraumatic, PERRLA, EOMI, sclera anicteric, oral mucosa pink and moist, dentition intact, ext ear canals clear,  Neck: Supple without JVD or lymphadenopathy Heart: Reg rate and rhythm. No murmurs rubs or gallops Chest: CTA bilaterally without wheezes, rales, or rhonchi; no distress Abdomen: Soft, non-tender, non-distended, bowel sounds positive. Extremities: No clubbing, cyanosis, or edema. Pulses are 2+ Skin: Clean and intact without signs of breakdown Neurological: Patient is AOx3.  Speech is dysarthric but intelligible.  Follows simple commands. +facial droop. 5/5 strength in RUE and bilateral lower extremities. LUE: 3/5 SA, 2/5 EE and EF, 0/5 WF and hand grip with swelling in right hand.  Musculoskeletal: Full ROM, No pain with AROM or PROM in the neck, trunk, or extremities. Posture appropriate Psych: Pt's affect is appropriate. Pt is cooperative. Expresses frustration with medical condition and situational depression.    Results for orders placed or performed during the hospital encounter of 01/24/20 (from the past 24 hour(s))  CBC     Status: Abnormal   Collection Time: 01/28/20  4:00 AM  Result Value Ref Range   WBC 6.1 4.0 - 10.5 K/uL   RBC 3.06 (L) 4.22 - 5.81 MIL/uL   Hemoglobin 9.9 (L) 13.0 - 17.0 g/dL   HCT 29.4 (L) 39.0 - 52.0 %   MCV 96.1 80.0 - 100.0 fL   MCH 32.4 26.0 - 34.0 pg   MCHC 33.7 30.0 - 36.0 g/dL   RDW 13.7 11.5 - 15.5 %   Platelets 169 150 - 400 K/uL   nRBC 0.0 0.0 - 0.2 %  Basic metabolic panel     Status: Abnormal   Collection Time: 01/28/20  4:00 AM  Result Value Ref Range   Sodium 138 135 - 145 mmol/L   Potassium 3.7 3.5 - 5.1 mmol/L   Chloride 108 98 - 111 mmol/L   CO2 24 22 - 32 mmol/L   Glucose, Bld 111 (H) 70 - 99 mg/dL   BUN 11 8 - 23 mg/dL   Creatinine, Ser 0.90 0.61 - 1.24 mg/dL   Calcium 8.0 (L) 8.9 - 10.3 mg/dL   GFR calc non Af Amer >60 >60 mL/min   GFR calc Af Amer >60 >60 mL/min   Anion gap 6 5 - 15  Magnesium     Status: None   Collection Time: 01/28/20  4:00 AM  Result Value Ref Range   Magnesium 1.8  1.7 - 2.4 mg/dL  Phosphorus     Status: Abnormal   Collection Time: 01/28/20  4:00 AM  Result Value Ref Range   Phosphorus 2.4 (L) 2.5 - 4.6 mg/dL   VAS [place] LOWER EXTREMITY VENOUS (DVT)  Result Date: 01/26/2020  Lower Venous DVTStudy Indications: Stroke.  Comparison Study: No prior study on file for comparison Performing Technologist: Sharion Dove RVS  Examination Guidelines: A complete evaluation includes  B-mode imaging, spectral Doppler, color Doppler, and power Doppler as needed of all accessible portions of each vessel. Bilateral testing is considered an integral part of a complete examination. Limited examinations for reoccurring indications may be performed as noted. The reflux portion of the exam is performed with the patient in reverse Trendelenburg.  +---------+---------------+---------+-----------+----------+--------------+ RIGHT    CompressibilityPhasicitySpontaneityPropertiesThrombus Aging +---------+---------------+---------+-----------+----------+--------------+ CFV      Full           Yes      Yes                                 +---------+---------------+---------+-----------+----------+--------------+ SFJ      Full                                                        +---------+---------------+---------+-----------+----------+--------------+ FV Prox  Full                                                        +---------+---------------+---------+-----------+----------+--------------+ FV Mid   Full                                                        +---------+---------------+---------+-----------+----------+--------------+ FV DistalFull                                                        +---------+---------------+---------+-----------+----------+--------------+ PFV      Full                                                        +---------+---------------+---------+-----------+----------+--------------+ POP      Full           Yes      Yes                                 +---------+---------------+---------+-----------+----------+--------------+ PTV      Full                                                        +---------+---------------+---------+-----------+----------+--------------+  PERO     Full                                                         +---------+---------------+---------+-----------+----------+--------------+   +---------+---------------+---------+-----------+----------+--------------+ LEFT     CompressibilityPhasicitySpontaneityPropertiesThrombus Aging +---------+---------------+---------+-----------+----------+--------------+ CFV      Full           Yes      Yes                                 +---------+---------------+---------+-----------+----------+--------------+ SFJ      Full                                                        +---------+---------------+---------+-----------+----------+--------------+ FV Prox  Full                                                        +---------+---------------+---------+-----------+----------+--------------+ FV Mid   Full                                                        +---------+---------------+---------+-----------+----------+--------------+ FV DistalFull                                                        +---------+---------------+---------+-----------+----------+--------------+ PFV      Full                                                        +---------+---------------+---------+-----------+----------+--------------+ POP      Full           Yes      Yes                                 +---------+---------------+---------+-----------+----------+--------------+ PTV      Full                                                        +---------+---------------+---------+-----------+----------+--------------+ PERO     Full                                                        +---------+---------------+---------+-----------+----------+--------------+  Summary: BILATERAL: - No evidence of deep vein thrombosis seen in the lower extremities, bilaterally.   *See table(s) above for measurements and observations. Electronically signed by Deitra Mayo MD on 01/26/2020 at 4:57:01 PM.    Final       Assessment/Plan: Diagnosis: Impaired mobility and ADLs 2/2 right MCA stroke 1. Does the need for close, 24 hr/day medical supervision in concert with the patient's rehab needs make it unreasonable for this patient to be served in a less intensive setting? Yes 2. Co-Morbidities requiring supervision/potential complications: HLD, HTN, prostate cancer, remote tobacco abuse 3. Due to bowel management, safety, skin/wound care, disease management, medication administration, pain management and patient education, does the patient require 24 hr/day rehab nursing? Yes 4. Does the patient require coordinated care of a physician, rehab nurse, therapy disciplines of PT, OT, SLP to address physical and functional deficits in the context of the above medical diagnosis(es)? Yes Addressing deficits in the following areas: balance, endurance, locomotion, strength, transferring, bowel/bladder control, bathing, dressing, feeding, grooming, toileting, cognition, speech and psychosocial support 5. Can the patient actively participate in an intensive therapy program of at least 3 hrs of therapy per day at least 5 days per week? Yes 6. The potential for patient to make measurable gains while on inpatient rehab is excellent 7. Anticipated functional outcomes upon discharge from inpatient rehab are modified independent  with PT, modified independent with OT, modified independent with SLP. 8. Estimated rehab length of stay to reach the above functional goals is: 10-14 days 9. Anticipated discharge destination: Home 10. Overall Rehab/Functional Prognosis: excellent  RECOMMENDATIONS: This patient's condition is appropriate for continued rehabilitative care in the following setting: CIR Patient has agreed to participate in recommended program. Yes Note that insurance prior authorization may be required for reimbursement for recommended care.  Comment: Mr Clem would be an excellent CIR candidate. He would benefit  from neuropysch follow-up for situational depression. Discussed rationale for loop recorder today. Patient is anxious about missing his COVID-19 vaccination on Thursday. He has excellent home support from his wife, who is a former Tree surgeon.   Lavon Paganini Angiulli, PA-C 01/28/2020   I have personally performed a face to face diagnostic evaluation, including, but not limited to relevant history and physical exam findings, of this patient and developed relevant assessment and plan.  Additionally, I have reviewed and concur with the physician assistant's documentation above.  Leeroy Cha, MD

## 2020-01-28 NOTE — Progress Notes (Deleted)
Cristina Gong, RN  Rehab Admission Coordinator  Physical Medicine and Rehabilitation  PMR Pre-admission  Signed  Date of Service:  01/28/2020  3:40 PM          Signed        Show:Clear all [x] Manual[x] Template[x] Copied  Added by: [x] Cristina Gong, RN  [] Hover for details PMR Admission Coordinator Pre-Admission Assessment   Patient: Ruben Chavez is an 69 y.o., male MRN: [id number] DOB: [birth year] Height: 5\' 9"  (175.3 cm) Weight: 82.6 kg                                                                                                                                                  Insurance Information HMO:     PPO: yes     PCP:      IPA:      80/20:      OTHER:  PRIMARY: [employer]      Policy#: [id number]      Subscriber: pt CM Name: Crystal   Phone#: [phone]     Fax#: Epic access Pre-Cert#: [id number]  Employer:  Benefits:  Phone #: [phone]     Name: 2/22 Eff. Date: 12/07/2019     Deduct: none      Out of Pocket Max: $3400      Life Max: none CIR: $295 co pay per day days 1 until 6      SNF: no co pay per day days 1 until 20; $178 co pay per day days 21 until 100 Outpatient: $15 per visit     Co-Pay: visits per medical neccesity Home Health: 100%      Co-Pay: visits per medical neccesity DME: 80%     Co-Pay: 20% Providers: in network  SECONDARY: none         Medicaid Application Date:       Case Manager:  Disability Application Date:       Case Worker:    The "Data Collection Information Summary" for patients in Inpatient Rehabilitation Facilities with attached "Privacy Act [place] Records" was provided and verbally reviewed with: Patient and Family   Emergency Contact Information         Contact Information     Name Relation Home Work Mobile    Scheier,Katherine Spouse [phone]           Current Medical History  Patient Admitting Diagnosis: CVA  History of Present Illness:  69 y.o. right-handed male with  history of hyperlipidemia, hypertension, prostate cancer radioactive seed implant, remote tobacco abuse.   Presented 01/24/2020 with acute onset of left-sided weakness facial droop and dysarthria.  Cranial CT scan showed subacute infarction right lateral temporal lobe.  No hemorrhage or mass-effect.  CT angiogram of head and neck embolic occlusion of the superior division M2 branch on the right.  Patient did receive TPA.  Underwent revascularization procedure per interventional radiology.  Echocardiogram with ejection fraction of 65% without emboli.  Venous Doppler studies negative.  Neurology consulted maintain on aspirin and Plavix for CVA prophylaxis.  Subcutaneous Lovenox for DVT prophylaxis.  TEE showed ejection fraction 65% negative bubble study without thrombus.  Loop recorder placed 01/28/2020.  Patient did have occasional bouts of orthostatic hypotension blood pressure in the 60s on 01/26/2020 with conservative care and monitored when out of bed.     Complete NIHSS TOTAL: 7 Glasgow Coma Scale Score: 15   Past Medical History      Past Medical History:  Diagnosis Date  . Elevated PSA    . Hyperlipidemia      pt says not anymore  . Hypertension    . Prostate cancer ([place]) 2013      Family History  family history includes Heart disease in his father; Pancreatic cancer in his mother.   Prior Rehab/Hospitalizations:  Has the patient had prior rehab or hospitalizations prior to admission? Yes   Has the patient had major surgery during 100 days prior to admission? Yes   Current Medications    Current Facility-Administered Medications:  .  [MAR Hold] acetaminophen (TYLENOL) tablet 650 mg, 650 mg, Oral, Q4H PRN **OR** [MAR Hold] acetaminophen (TYLENOL) 160 MG/5ML solution 650 mg, 650 mg, Per Tube, Q4H PRN **OR** [MAR Hold] acetaminophen (TYLENOL) suppository 650 mg, 650 mg, Rectal, Q4H PRN, [name], MD .  Doug Sou Hold] acetaminophen (TYLENOL) tablet 650 mg, 650 mg, Oral, Q4H PRN, 650  mg at 01/27/20 2102 **OR** [MAR Hold] acetaminophen (TYLENOL) suppository 650 mg, 650 mg, Rectal, Q4H PRN, Kerney Elbe, MD .  Doug Sou Hold] aspirin EC tablet 325 mg, 325 mg, Oral, Daily, Rosalin Hawking, MD, 325 mg at 01/28/20 0947 .  [MAR Hold] atorvastatin (LIPITOR) tablet 20 mg, 20 mg, Oral, q1800, Rosalin Hawking, MD, 20 mg at 01/27/20 1712 .  [MAR Hold] clopidogrel (PLAVIX) tablet 75 mg, 75 mg, Oral, Daily, Rosalin Hawking, MD, 75 mg at 01/28/20 0947 .  [MAR Hold] enoxaparin (LOVENOX) injection 40 mg, 40 mg, Subcutaneous, Q24H, Rosalin Hawking, MD, 40 mg at 01/28/20 0947 .  [MAR Hold] folic acid (FOLVITE) tablet 1 mg, 1 mg, Oral, Daily, Garvin Fila, MD, 1 mg at 01/28/20 0948 .  [MAR Hold] labetalol (NORMODYNE) injection 5-20 mg, 5-20 mg, Intravenous, Q2H PRN, Rosalin Hawking, MD .  Doug Sou Hold] lisinopril (ZESTRIL) tablet 20 mg, 20 mg, Oral, Daily, Rosalin Hawking, MD, 20 mg at 01/28/20 0947 .  [MAR Hold] midazolam (VERSED) injection 1-2 mg, 1-2 mg, Intravenous, Q2H PRN, Desai, Rahul P, PA-C .  [MAR Hold] multivitamin with minerals tablet 1 tablet, 1 tablet, Oral, Daily, Garvin Fila, MD, 1 tablet at 01/28/20 0947 .  [MAR Hold] pantoprazole (PROTONIX) EC tablet 40 mg, 40 mg, Oral, Daily, Rosalin Hawking, MD, 40 mg at 01/28/20 0947 .  [MAR Hold] senna-docusate (Senokot-S) tablet 1 tablet, 1 tablet, Oral, QHS PRN, Kerney Elbe, MD .  Doug Sou Hold] thiamine tablet 100 mg, 100 mg, Oral, Daily, Garvin Fila, MD, 100 mg at 01/28/20 0947 .  [MAR Hold] traZODone (DESYREL) tablet 100 mg, 100 mg, Oral, QHS, Kerney Elbe, MD, 100 mg at 01/27/20 2103   Patients Current Diet:     Diet Order                      Diet Heart Room service appropriate? Yes; Fluid consistency: Thin  Diet effective now  Precautions / Restrictions Precautions Precautions: Fall Precaution Comments: poor insight into deficits Restrictions Weight Bearing Restrictions: No    Has the patient had 2 or more falls or a fall  with injury in the past year?No   Prior Activity Level Community (5-7x/wk): Independent, active; recently retired   Prior Functional Level Prior Function Level of Independence: Independent Comments: Recently retired in July. ADLs and IADLs.   Self Care: Did the patient need help bathing, dressing, using the toilet or eating?  Independent   Indoor Mobility: Did the patient need assistance with walking from room to room (with or without device)? Independent   Stairs: Did the patient need assistance with internal or external stairs (with or without device)? Independent   Functional Cognition: Did the patient need help planning regular tasks such as shopping or remembering to take medications? Independent   Home Assistive Devices / Equipment Home Equipment: Crutches   Prior Device Use: Indicate devices/aids used by the patient prior to current illness, exacerbation or injury? None of the above   Current Functional Level Cognition   Arousal/Alertness: Awake/alert Overall Cognitive Status: Impaired/Different from baseline Current Attention Level: Focused, Sustained Orientation Level: Oriented X4 Following Commands: Follows one step commands inconsistently Safety/Judgement: Decreased awareness of safety, Decreased awareness of deficits General Comments: pt highly distractable, especially when wife present and multiple people speaking. Pt benefiting from calm, direct cues. Attention: Focused, Sustained Focused Attention: Appears intact Sustained Attention: Impaired Sustained Attention Impairment: Verbal complex Memory: Impaired Memory Impairment: Storage deficit, Retrieval deficit, Decreased recall of new information(Immediate: 5/5; delayed: 3/5) Awareness: Impaired Awareness Impairment: Intellectual impairment Problem Solving: (WNL for functional problem solving) Executive Function: Reasoning, Sequencing Reasoning: Impaired Reasoning Impairment: Verbal complex Sequencing:  Appears intact Safety/Judgment: Appears intact    Extremity Assessment (includes Sensation/Coordination)   Upper Extremity Assessment: LUE deficits/detail LUE Deficits / Details: increased edema. Noting slight flexion tone at elbow and shoulder. No active movements at hand, wrist, and elbow. Pt with compensatory hiking of shoulder LUE Sensation: decreased light touch, decreased proprioception(Poor deep touch) LUE Coordination: decreased fine motor, decreased gross motor  Lower Extremity Assessment: Defer to PT evaluation LLE Deficits / Details: pt taps L foot on the floor several times before placing it down to step, denies sensory deficits but appears to have diminished sensation LLE. hip flex 3+/5, knee ext 4+/5, knee flex 4+/5, no L knee buckling LLE Sensation: decreased proprioception LLE Coordination: decreased gross motor, decreased fine motor     ADLs   Overall ADL's : Needs assistance/impaired Eating/Feeding: Minimal assistance, Sitting Eating/Feeding Details (indicate cue type and reason): bilateral tasks Grooming: Moderate assistance, Sitting Grooming Details (indicate cue type and reason): Mod A for bilateral tasks Upper Body Bathing: Moderate assistance, Sitting Lower Body Bathing: Moderate assistance, Sit to/from stand Upper Body Dressing : Moderate assistance, Sitting Lower Body Dressing: Maximal assistance, Sit to/from stand Lower Body Dressing Details (indicate cue type and reason): Max A for donning socks Toilet Transfer: Minimal assistance, +2 for safety/equipment, Ambulation, RW(simulated to recliner) Toilet Transfer Details (indicate cue type and reason): Min A for balance nad RW management.  Functional mobility during ADLs: Minimal assistance, +2 for safety/equipment, Rolling walker General ADL Comments: Pt presenting with decreased cognition, balance, strength, functional use of LUE, and safety.     Mobility   Overal bed mobility: Needs Assistance Bed Mobility:  Supine to Sit Supine to sit: Min assist General bed mobility comments: Pt able to bring BLEs towards EOB. Min A for elevating trunk  Transfers   Overall transfer level: Needs assistance Equipment used: Rolling walker (2 wheeled) Transfers: Sit to/from Stand Sit to Stand: Min assist General transfer comment: Min A for safety and balance once in standing     Ambulation / Gait / Stairs / Wheelchair Mobility   Ambulation/Gait Ambulation/Gait assistance: Min assist, +2 safety/equipment Gait Distance (Feet): 20 Feet Assistive device: Rolling walker (2 wheeled) Gait Pattern/deviations: Step-to pattern General Gait Details: pt tapping L foot down on floor several times before putting wt on it or occasionally moving it fwd then bkwd. Easily overstimulated and confused by task.  Gait velocity: decreased Gait velocity interpretation: <1.31 ft/sec, indicative of household ambulator     Posture / Balance Balance Overall balance assessment: Needs assistance Sitting-balance support: No upper extremity supported, Feet supported Sitting balance-Leahy Scale: Fair Standing balance support: Bilateral upper extremity supported, During functional activity Standing balance-Leahy Scale: Poor Standing balance comment: needed UE support for safety     Special needs/care consideration BiPAP/CPAP CPM Continuous Drip IV Dialysis         Life Vest Oxygen Special Bed Trach Size Wound Vac Skin                              Bowel mgmt:continent Bladder mgmt:continent Diabetic mgmt Behavioral consideration  Wife reports pt with high anxiety and would like him to be considered to be put back on Wellbutrin which he used before to quit smoking Chemo/radiation  Designated visitor is wife , Belenda Cruise; retired Therapist, sports Wife to have [place] 3/3 so children to assist with 24 7 assist at d/c LOOP placed 2/22    Previous Home Environment  Living Arrangements: Spouse/significant other  Lives With: Spouse Available  Help at Discharge: Family, Available 24 hours/day(daughter, daughter in law and son to asisst at d/c) Type of Home: House Home Layout: Two level, 1/2 bath on main level Alternate Level Stairs-Rails: Right, Left Alternate Level Stairs-Number of Steps: 14 Home Access: Stairs to enter [employer] of Steps: 1 Bathroom Shower/Tub: Multimedia programmer: Standard Additional Comments: wife is home with him and she is retired, however, she is scheduled for THA on 02/06/20. Good family support from children though too.    Discharge Living Setting Plans for Discharge Living Setting: Patient's home, Lives with (comment)(wife) Type of Home at Discharge: House Discharge Home Layout: Two level, 1/2 bath on main level, Bed/bath upstairs Alternate Level Stairs-Rails: Right, Left Alternate Level Stairs-Number of Steps: 14 Discharge Home Access: Stairs to enter Entrance Stairs-Rails: None Entrance Stairs-Number of Steps: 1 Discharge Bathroom Shower/Tub: Horticulturist, commercial: Standard Discharge Bathroom Accessibility: Yes How Accessible: Accessible via walker Does the patient have any problems obtaining your medications?: No   Social/Family/Support Systems Patient Roles: Spouse, Parent Contact Information: wife, Belenda Cruise Anticipated Caregiver: daughter, daughter in law and son; wife for THA 3/3 Anticipated Caregiver's Contact Information: see above Ability/Limitations of Caregiver: wife to have surgery 3/3 THA. CHildren to asisst Caregiver Availability: 24/7 Discharge Plan Discussed with Primary Caregiver: Yes Is Caregiver In Agreement with Plan?: No Does Caregiver/Family have Issues with Lodging/Transportation while Pt is in Rehab?: No   Goals/Additional Needs Patient/Family Goal for Rehab: Mod I with PT, OT, and SLP Expected length of stay: ELOS 10 to 14 days Pt/Family Agrees to Admission and willing to participate: Yes Program Orientation Provided &  Reviewed with Pt/Caregiver Including Roles  & Responsibilities: Yes   Decrease burden of Care through IP rehab admission:  Possible need for SNF placement upon discharge:   Patient Condition: This patient's condition remains as documented in the consult dated 01/28/2020, in which the Rehabilitation Physician determined and documented that the patient's condition is appropriate for intensive rehabilitative care in an inpatient rehabilitation facility. Will admit to inpatient rehab today.   Preadmission Screen Completed By:  Cleatrice Burke, RN, 01/28/2020 4:19 PM ______________________________________________________________________   Discussed status with Dr. Naaman Plummer on 01/28/2020 at 1620 and received approval for admission today.   Admission Coordinator:  Cleatrice Burke, time [id number] Date 01/28/2020         Cosigned by: Meredith Staggers, MD at 01/28/2020  4:24 PM  Revision History

## 2020-01-28 NOTE — Anesthesia Preprocedure Evaluation (Signed)
Anesthesia Evaluation  Patient identified by MRN, date of birth, ID band Patient awake    Reviewed: Allergy & Precautions, NPO status , Patient's Chart, lab work & pertinent test results  Airway Mallampati: II  TM Distance: >3 FB Neck ROM: Full    Dental  (+) Teeth Intact, Dental Advisory Given   Pulmonary former smoker,    breath sounds clear to auscultation       Cardiovascular hypertension,  Rhythm:Regular Rate:Normal     Neuro/Psych    GI/Hepatic   Endo/Other    Renal/GU      Musculoskeletal   Abdominal   Peds  Hematology   Anesthesia Other Findings   Reproductive/Obstetrics                             Anesthesia Physical  Anesthesia Plan  ASA: III  Anesthesia Plan: MAC   Post-op Pain Management:    Induction: Intravenous  PONV Risk Score and Plan: Ondansetron and Propofol infusion  Airway Management Planned: Natural Airway and Nasal Cannula  Additional Equipment:   Intra-op Plan:   Post-operative Plan:   Informed Consent: I have reviewed the patients History and Physical, chart, labs and discussed the procedure including the risks, benefits and alternatives for the proposed anesthesia with the patient or authorized representative who has indicated his/her understanding and acceptance.   Dental advisory given  Plan Discussed with: CRNA and Anesthesiologist  Anesthesia Plan Comments:         Anesthesia Quick Evaluation  

## 2020-01-28 NOTE — Progress Notes (Signed)
PT Cancellation Note  Patient Details Name: Ruben Chavez MRN: [id number] DOB: 1951/08/07   Cancelled Treatment:    Reason Eval/Treat Not Completed: Patient at procedure or test/unavailable Pt at TEE with loop recorder placed in am and then out to cath lab in pm.  Will follow up as able.  Maggie Font, PT Acute Rehab Services Pager [phone] [facility] [place] 01/28/2020, 3:38 PM

## 2020-01-28 NOTE — Progress Notes (Signed)
Cristina Gong, RN  Rehab Admission Coordinator  Physical Medicine and Rehabilitation  PMR Pre-admission  Signed  Date of Service:  01/28/2020  3:40 PM          Signed        Show:Clear all [x] Manual[x] Template[x] Copied  Added by: [x] Cristina Gong, RN  [] Hover for details PMR Admission Coordinator Pre-Admission Assessment   Patient: Ruben Chavez is an 69 y.o., male MRN: [id number] DOB: 23-Oct-1951 Height: 5\' 9"  (175.3 cm) Weight: 82.6 kg                                                                                                                                                  Insurance Information HMO:     PPO: yes     PCP:      IPA:      80/20:      OTHER:  PRIMARY: [employer]      Policy#: [id number]      Subscriber: pt CM Name: Crystal   Phone#: [phone]     Fax#: Epic access Pre-Cert#: [id number]  Employer:  Benefits:  Phone #: [phone]     Name: 2/22 Eff. Date: 12/07/2019     Deduct: none      Out of Pocket Max: $3400      Life Max: none CIR: $295 co pay per day days 1 until 6      SNF: no co pay per day days 1 until 20; $178 co pay per day days 21 until 100 Outpatient: $15 per visit     Co-Pay: visits per medical neccesity Home Health: 100%      Co-Pay: visits per medical neccesity DME: 80%     Co-Pay: 20% Providers: in network  SECONDARY: none         Medicaid Application Date:       Case Manager:  Disability Application Date:       Case Worker:    The "Data Collection Information Summary" for patients in Inpatient Rehabilitation Facilities with attached "Privacy Act [place] Records" was provided and verbally reviewed with: Patient and Family   Emergency Contact Information         Contact Information     Name Relation Home Work Mobile    Sneed,Katherine Spouse [phone]           Current Medical History  Patient Admitting Diagnosis: CVA  History of Present Illness:  69 y.o. right-handed male with  history of hyperlipidemia, hypertension, prostate cancer radioactive seed implant, remote tobacco abuse.   Presented 01/24/2020 with acute onset of left-sided weakness facial droop and dysarthria.  Cranial CT scan showed subacute infarction right lateral temporal lobe.  No hemorrhage or mass-effect.  CT angiogram of head and neck embolic occlusion of the superior division M2 branch on the right.  Patient did receive TPA.  Underwent revascularization procedure per interventional radiology.  Echocardiogram with ejection fraction of 65% without emboli.  Venous Doppler studies negative.  Neurology consulted maintain on aspirin and Plavix for CVA prophylaxis.  Subcutaneous Lovenox for DVT prophylaxis.  TEE showed ejection fraction 65% negative bubble study without thrombus.  Loop recorder placed 01/28/2020.  Patient did have occasional bouts of orthostatic hypotension blood pressure in the 60s on 01/26/2020 with conservative care and monitored when out of bed.     Complete NIHSS TOTAL: 7 Glasgow Coma Scale Score: 15   Past Medical History      Past Medical History:  Diagnosis Date  . Elevated PSA    . Hyperlipidemia      pt says not anymore  . Hypertension    . Prostate cancer ([place]) 2013      Family History  family history includes Heart disease in his father; Pancreatic cancer in his mother.   Prior Rehab/Hospitalizations:  Has the patient had prior rehab or hospitalizations prior to admission? Yes   Has the patient had major surgery during 100 days prior to admission? Yes   Current Medications    Current Facility-Administered Medications:  .  [MAR Hold] acetaminophen (TYLENOL) tablet 650 mg, 650 mg, Oral, Q4H PRN **OR** [MAR Hold] acetaminophen (TYLENOL) 160 MG/5ML solution 650 mg, 650 mg, Per Tube, Q4H PRN **OR** [MAR Hold] acetaminophen (TYLENOL) suppository 650 mg, 650 mg, Rectal, Q4H PRN, [name], MD .  Doug Sou Hold] acetaminophen (TYLENOL) tablet 650 mg, 650 mg, Oral, Q4H PRN, 650  mg at 01/27/20 2102 **OR** [MAR Hold] acetaminophen (TYLENOL) suppository 650 mg, 650 mg, Rectal, Q4H PRN, Kerney Elbe, MD .  Doug Sou Hold] aspirin EC tablet 325 mg, 325 mg, Oral, Daily, Rosalin Hawking, MD, 325 mg at 01/28/20 0947 .  [MAR Hold] atorvastatin (LIPITOR) tablet 20 mg, 20 mg, Oral, q1800, Rosalin Hawking, MD, 20 mg at 01/27/20 1712 .  [MAR Hold] clopidogrel (PLAVIX) tablet 75 mg, 75 mg, Oral, Daily, Rosalin Hawking, MD, 75 mg at 01/28/20 0947 .  [MAR Hold] enoxaparin (LOVENOX) injection 40 mg, 40 mg, Subcutaneous, Q24H, Rosalin Hawking, MD, 40 mg at 01/28/20 0947 .  [MAR Hold] folic acid (FOLVITE) tablet 1 mg, 1 mg, Oral, Daily, Garvin Fila, MD, 1 mg at 01/28/20 0948 .  [MAR Hold] labetalol (NORMODYNE) injection 5-20 mg, 5-20 mg, Intravenous, Q2H PRN, Rosalin Hawking, MD .  Doug Sou Hold] lisinopril (ZESTRIL) tablet 20 mg, 20 mg, Oral, Daily, Rosalin Hawking, MD, 20 mg at 01/28/20 0947 .  [MAR Hold] midazolam (VERSED) injection 1-2 mg, 1-2 mg, Intravenous, Q2H PRN, Desai, Rahul P, PA-C .  [MAR Hold] multivitamin with minerals tablet 1 tablet, 1 tablet, Oral, Daily, Garvin Fila, MD, 1 tablet at 01/28/20 0947 .  [MAR Hold] pantoprazole (PROTONIX) EC tablet 40 mg, 40 mg, Oral, Daily, Rosalin Hawking, MD, 40 mg at 01/28/20 0947 .  [MAR Hold] senna-docusate (Senokot-S) tablet 1 tablet, 1 tablet, Oral, QHS PRN, Kerney Elbe, MD .  Doug Sou Hold] thiamine tablet 100 mg, 100 mg, Oral, Daily, Garvin Fila, MD, 100 mg at 01/28/20 0947 .  [MAR Hold] traZODone (DESYREL) tablet 100 mg, 100 mg, Oral, QHS, Kerney Elbe, MD, 100 mg at 01/27/20 2103   Patients Current Diet:     Diet Order                      Diet Heart Room service appropriate? Yes; Fluid consistency: Thin  Diet effective now  Precautions / Restrictions Precautions Precautions: Fall Precaution Comments: poor insight into deficits Restrictions Weight Bearing Restrictions: No    Has the patient had 2 or more falls or a fall  with injury in the past year?No   Prior Activity Level Community (5-7x/wk): Independent, active; recently retired   Prior Functional Level Prior Function Level of Independence: Independent Comments: Recently retired in July. ADLs and IADLs.   Self Care: Did the patient need help bathing, dressing, using the toilet or eating?  Independent   Indoor Mobility: Did the patient need assistance with walking from room to room (with or without device)? Independent   Stairs: Did the patient need assistance with internal or external stairs (with or without device)? Independent   Functional Cognition: Did the patient need help planning regular tasks such as shopping or remembering to take medications? Independent   Home Assistive Devices / Equipment Home Equipment: Crutches   Prior Device Use: Indicate devices/aids used by the patient prior to current illness, exacerbation or injury? None of the above   Current Functional Level Cognition   Arousal/Alertness: Awake/alert Overall Cognitive Status: Impaired/Different from baseline Current Attention Level: Focused, Sustained Orientation Level: Oriented X4 Following Commands: Follows one step commands inconsistently Safety/Judgement: Decreased awareness of safety, Decreased awareness of deficits General Comments: pt highly distractable, especially when wife present and multiple people speaking. Pt benefiting from calm, direct cues. Attention: Focused, Sustained Focused Attention: Appears intact Sustained Attention: Impaired Sustained Attention Impairment: Verbal complex Memory: Impaired Memory Impairment: Storage deficit, Retrieval deficit, Decreased recall of new information(Immediate: 5/5; delayed: 3/5) Awareness: Impaired Awareness Impairment: Intellectual impairment Problem Solving: (WNL for functional problem solving) Executive Function: Reasoning, Sequencing Reasoning: Impaired Reasoning Impairment: Verbal complex Sequencing:  Appears intact Safety/Judgment: Appears intact    Extremity Assessment (includes Sensation/Coordination)   Upper Extremity Assessment: LUE deficits/detail LUE Deficits / Details: increased edema. Noting slight flexion tone at elbow and shoulder. No active movements at hand, wrist, and elbow. Pt with compensatory hiking of shoulder LUE Sensation: decreased light touch, decreased proprioception(Poor deep touch) LUE Coordination: decreased fine motor, decreased gross motor  Lower Extremity Assessment: Defer to PT evaluation LLE Deficits / Details: pt taps L foot on the floor several times before placing it down to step, denies sensory deficits but appears to have diminished sensation LLE. hip flex 3+/5, knee ext 4+/5, knee flex 4+/5, no L knee buckling LLE Sensation: decreased proprioception LLE Coordination: decreased gross motor, decreased fine motor     ADLs   Overall ADL's : Needs assistance/impaired Eating/Feeding: Minimal assistance, Sitting Eating/Feeding Details (indicate cue type and reason): bilateral tasks Grooming: Moderate assistance, Sitting Grooming Details (indicate cue type and reason): Mod A for bilateral tasks Upper Body Bathing: Moderate assistance, Sitting Lower Body Bathing: Moderate assistance, Sit to/from stand Upper Body Dressing : Moderate assistance, Sitting Lower Body Dressing: Maximal assistance, Sit to/from stand Lower Body Dressing Details (indicate cue type and reason): Max A for donning socks Toilet Transfer: Minimal assistance, +2 for safety/equipment, Ambulation, RW(simulated to recliner) Toilet Transfer Details (indicate cue type and reason): Min A for balance nad RW management.  Functional mobility during ADLs: Minimal assistance, +2 for safety/equipment, Rolling walker General ADL Comments: Pt presenting with decreased cognition, balance, strength, functional use of LUE, and safety.     Mobility   Overal bed mobility: Needs Assistance Bed Mobility:  Supine to Sit Supine to sit: Min assist General bed mobility comments: Pt able to bring BLEs towards EOB. Min A for elevating trunk  Transfers   Overall transfer level: Needs assistance Equipment used: Rolling walker (2 wheeled) Transfers: Sit to/from Stand Sit to Stand: Min assist General transfer comment: Min A for safety and balance once in standing     Ambulation / Gait / Stairs / Wheelchair Mobility   Ambulation/Gait Ambulation/Gait assistance: Min assist, +2 safety/equipment Gait Distance (Feet): 20 Feet Assistive device: Rolling walker (2 wheeled) Gait Pattern/deviations: Step-to pattern General Gait Details: pt tapping L foot down on floor several times before putting wt on it or occasionally moving it fwd then bkwd. Easily overstimulated and confused by task.  Gait velocity: decreased Gait velocity interpretation: <1.31 ft/sec, indicative of household ambulator     Posture / Balance Balance Overall balance assessment: Needs assistance Sitting-balance support: No upper extremity supported, Feet supported Sitting balance-Leahy Scale: Fair Standing balance support: Bilateral upper extremity supported, During functional activity Standing balance-Leahy Scale: Poor Standing balance comment: needed UE support for safety     Special needs/care consideration BiPAP/CPAP CPM Continuous Drip IV Dialysis         Life Vest Oxygen Special Bed Trach Size Wound Vac Skin                              Bowel mgmt:continent Bladder mgmt:continent Diabetic mgmt Behavioral consideration  Wife reports pt with high anxiety and would like him to be considered to be put back on Wellbutrin which he used before to quit smoking Chemo/radiation  Designated visitor is wife , Belenda Cruise; retired Therapist, sports Wife to have [place] 3/3 so children to assist with 24 7 assist at d/c LOOP placed 2/22    Previous Home Environment  Living Arrangements: Spouse/significant other  Lives With: Spouse Available  Help at Discharge: Family, Available 24 hours/day(daughter, daughter in law and son to asisst at d/c) Type of Home: House Home Layout: Two level, 1/2 bath on main level Alternate Level Stairs-Rails: Right, Left Alternate Level Stairs-Number of Steps: 14 Home Access: Stairs to enter [employer] of Steps: 1 Bathroom Shower/Tub: Multimedia programmer: Standard Additional Comments: wife is home with him and she is retired, however, she is scheduled for THA on 02/06/20. Good family support from children though too.    Discharge Living Setting Plans for Discharge Living Setting: Patient's home, Lives with (comment)(wife) Type of Home at Discharge: House Discharge Home Layout: Two level, 1/2 bath on main level, Bed/bath upstairs Alternate Level Stairs-Rails: Right, Left Alternate Level Stairs-Number of Steps: 14 Discharge Home Access: Stairs to enter Entrance Stairs-Rails: None Entrance Stairs-Number of Steps: 1 Discharge Bathroom Shower/Tub: Horticulturist, commercial: Standard Discharge Bathroom Accessibility: Yes How Accessible: Accessible via walker Does the patient have any problems obtaining your medications?: No   Social/Family/Support Systems Patient Roles: Spouse, Parent Contact Information: wife, Belenda Cruise Anticipated Caregiver: daughter, daughter in law and son; wife for THA 3/3 Anticipated Caregiver's Contact Information: see above Ability/Limitations of Caregiver: wife to have surgery 3/3 THA. CHildren to asisst Caregiver Availability: 24/7 Discharge Plan Discussed with Primary Caregiver: Yes Is Caregiver In Agreement with Plan?: No Does Caregiver/Family have Issues with Lodging/Transportation while Pt is in Rehab?: No   Goals/Additional Needs Patient/Family Goal for Rehab: Mod I with PT, OT, and SLP Expected length of stay: ELOS 10 to 14 days Pt/Family Agrees to Admission and willing to participate: Yes Program Orientation Provided &  Reviewed with Pt/Caregiver Including Roles  & Responsibilities: Yes   Decrease burden of Care through IP rehab admission:  Possible need for SNF placement upon discharge:   Patient Condition: This patient's condition remains as documented in the consult dated 01/28/2020, in which the Rehabilitation Physician determined and documented that the patient's condition is appropriate for intensive rehabilitative care in an inpatient rehabilitation facility. Will admit to inpatient rehab today.   Preadmission Screen Completed By:  Cleatrice Burke, RN, 01/28/2020 4:19 PM ______________________________________________________________________   Discussed status with Dr. Naaman Plummer on 01/28/2020 at 1620 and received approval for admission today.   Admission Coordinator:  Cleatrice Burke, time [id number] Date 01/28/2020         Cosigned by: Meredith Staggers, MD at 01/28/2020  4:24 PM  Revision History

## 2020-01-28 NOTE — Discharge Summary (Addendum)
Stroke Discharge Summary  Patient ID: Ruben Chavez   MRN: [id number]      DOB: October 13, 1951  Date of Admission: 01/24/2020 Date of Discharge: 01/28/2020  Attending Physician:  Rosalin Hawking, MD, Stroke MD Consultant(s):  Olene Craven) Estanislado Pandy, MD (Interventional Neuroradiologist), Jenkins Rouge, MD (Lbcardiology, Rounding, MD for TEE), Cristopher Peru, MD (electrophysiology-loop), Leeroy Cha, MD (Physical Medicine & Rehabilitation)   Patient's PCP:  Isaac Bliss, Rayford Halsted, MD  Discharge Diagnoses:  Principal Problem:   Stroke (cerebrum) ([place]) -  [name] Active Problems:   Essential hypertension   Prostate cancer ([place])   Hyperlipidemia   Middle cerebral artery embolism, right   Acute respiratory failure with hypoxia ([place])   Hypotension   Hypokalemia   Hypocalcemia   Encephalopathy acute   Hypertensive urgency   Urinary retention  Medications to be continued on Rehab Allergies as of 01/28/2020      Reactions   Sulfa Antibiotics Swelling, Other (See Comments)   Swelling in ankles   Advil [ibuprofen] Other (See Comments)   "Dots on chest" (Petechiae)   Aleve [naproxen] Other (See Comments)   "Dots on chest" (Petechiae)   Betadine [povidone Iodine] Itching, Rash   Chloroxylenol (antiseptic) Itching, Rash, Other (See Comments)   PCMX surgical sterilizing scrub   Poison Ivy Extract Rash   Povidone-iodine Itching, Rash   BETADINE      Medication List    STOP taking these medications   lisinopril-hydrochlorothiazide 20-25 MG tablet Commonly known as: ZESTORETIC     TAKE these medications   aspirin 325 MG EC tablet Take 1 tablet (325 mg total) by mouth daily. Start taking on: January 29, 2020   atorvastatin 20 MG tablet Commonly known as: LIPITOR Take 1 tablet (20 mg total) by mouth daily at 6 PM.   cephALEXin 500 MG capsule Commonly known as:  KEFLEX Take 2,000 mg by mouth See admin instructions. Take 2,000 mg by mouth one hour prior to dental appointment   clopidogrel 75 MG tablet Commonly known as: PLAVIX Take 1 tablet (75 mg total) by mouth daily. Start taking on: January 29, 2020   enoxaparin 40 MG/0.4ML injection Commonly known as: LOVENOX Inject 0.4 mLs (40 mg total) into the skin daily. Start taking on: January 29, 3556   folic acid 1 MG tablet Commonly known as: FOLVITE Take 1 tablet (1 mg total) by mouth daily. Start taking on: January 29, 2020   HYDROcodone-acetaminophen 5-325 MG tablet Commonly known as: NORCO/VICODIN Take 1 tablet by mouth every 6 (six) hours as needed for moderate pain.   lisinopril 20 MG tablet Commonly known as: ZESTRIL Take 1 tablet (20 mg total) by mouth daily. Start taking on: January 29, 2020   methocarbamol 500 MG tablet Commonly known as: Robaxin Take 1 tablet (500 mg total) by mouth every 8 (eight) hours as needed for muscle spasms.   multivitamin with minerals Tabs tablet Take 1 tablet by mouth daily. Start taking on: January 29, 2020   thiamine 100 MG tablet Take 1 tablet (100 mg total) by mouth daily. Start taking on: January 29, 2020   traZODone 100 MG tablet Commonly known as: DESYREL Take 1 tablet (100 mg total) by mouth at bedtime.       LABORATORY STUDIES CBC    Component Value Date/Time   WBC 6.1 01/28/2020 0400   RBC 3.06 (L) 01/28/2020  0400   HGB 9.9 (L) 01/28/2020 0400   HCT 29.4 (L) 01/28/2020 0400   PLT 169 01/28/2020 0400   MCV 96.1 01/28/2020 0400   MCH 32.4 01/28/2020 0400   MCHC 33.7 01/28/2020 0400   RDW 13.7 01/28/2020 0400   LYMPHSABS 1.1 01/25/2020 0150   MONOABS 0.8 01/25/2020 0150   EOSABS 0.0 01/25/2020 0150   BASOSABS 0.0 01/25/2020 0150   CMP    Component Value Date/Time   NA 138 01/28/2020 0400   K 3.7 01/28/2020 0400   CL 108 01/28/2020 0400   CO2 24 01/28/2020 0400   GLUCOSE 111 (H) 01/28/2020 0400   BUN 11  01/28/2020 0400   CREATININE 0.90 01/28/2020 0400   CALCIUM 8.0 (L) 01/28/2020 0400   PROT 7.3 01/24/2020 2006   ALBUMIN 4.2 01/24/2020 2006   AST 50 (H) 01/24/2020 2006   ALT 55 (H) 01/24/2020 2006   ALKPHOS 40 01/24/2020 2006   BILITOT 1.1 01/24/2020 2006   GFRNONAA >60 01/28/2020 0400   GFRAA >60 01/28/2020 0400   COAGS Lab Results  Component Value Date   INR 1.0 01/24/2020   INR 0.98 01/12/2018   INR 1.00 04/15/2016   Lipid Panel    Component Value Date/Time   CHOL 117 01/25/2020 0150   TRIG 135 01/25/2020 0150   HDL 36 (L) 01/25/2020 0150   CHOLHDL 3.3 01/25/2020 0150   VLDL 27 01/25/2020 0150   LDLCALC 54 01/25/2020 0150   HgbA1C  Lab Results  Component Value Date   HGBA1C 5.0 01/25/2020    SIGNIFICANT DIAGNOSTIC STUDIES CT Code Stroke CTA Head W/WO contrast  Result Date: 01/24/2020 CLINICAL DATA:  Left-sided weakness.  Neglect. EXAM: CT ANGIOGRAPHY HEAD AND NECK TECHNIQUE: Multidetector CT imaging of the head and neck was performed using the standard protocol during bolus administration of intravenous contrast. Multiplanar CT image reconstructions and MIPs were obtained to evaluate the vascular anatomy. Carotid stenosis measurements (when applicable) are obtained utilizing NASCET criteria, using the distal internal carotid diameter as the denominator. CONTRAST:  26m OMNIPAQUE IOHEXOL 350 MG/ML SOLN COMPARISON:  Head CT earlier same day FINDINGS: CTA NECK FINDINGS Aortic arch: Aortic atherosclerotic calcification. No aneurysm or dissection. Right carotid system: Right common carotid artery widely patent to the bifurcation. Calcified plaque at the carotid bifurcation and ICA bulb but no stenosis. Cervical ICA mildly irregular but patent to the skull base. Left carotid system: Common carotid artery widely patent to the bifurcation. Extensive calcified plaque at the carotid bifurcation and ICA bulb but no stenosis. Cervical ICA widely patent to the skull base. Vertebral  arteries: Calcified plaque at the right vertebral artery origin with 30% stenosis. Left vertebral artery origin widely patent. Both vertebral arteries widely patent beyond that through the cervical region to the foramen magnum. Skeleton: Ordinary spondylosis. Other neck: No mass or lymphadenopathy. Upper chest: Normal Review of the MIP images confirms the above findings CTA HEAD FINDINGS Anterior circulation: Both internal carotid arteries are patent through the skull base and siphon regions. There is atherosclerotic calcification in the carotid siphon regions but no stenosis greater than 30%. On the left, the anterior and middle cerebral vessels appear normal. On the right, there is embolic occlusion of the superior segment M2 branch. Posterior circulation: Both vertebral arteries are patent through the foramen magnum to the basilar. There is atherosclerotic disease in both V4 segments with stenoses estimated at 30-50%. No basilar stenosis. Posterior circulation branch vessels are patent. Venous sinuses: Patent and normal. Anatomic variants: None significant.  Review of the MIP images confirms the above findings IMPRESSION: Embolic occlusion of the superior division M2 branch on the right. Atherosclerotic disease at both carotid bifurcation regions but no sign of flow limiting stenosis. On the initial CT, I probably underestimated the aspects score. I think there are some early changes higher than the temporal lobe, aspects probably 8 rather than 9. Case discussed with Dr. Cheral Marker during interpretation. Electronically Signed   By: Nelson Chimes M.D.   On: 01/24/2020 20:44   CT Code Stroke CTA Neck W/WO contrast  Result Date: 01/24/2020 CLINICAL DATA:  Left-sided weakness.  Neglect. EXAM: CT ANGIOGRAPHY HEAD AND NECK TECHNIQUE: Multidetector CT imaging of the head and neck was performed using the standard protocol during bolus administration of intravenous contrast. Multiplanar CT image reconstructions and MIPs  were obtained to evaluate the vascular anatomy. Carotid stenosis measurements (when applicable) are obtained utilizing NASCET criteria, using the distal internal carotid diameter as the denominator. CONTRAST:  56m OMNIPAQUE IOHEXOL 350 MG/ML SOLN COMPARISON:  Head CT earlier same day FINDINGS: CTA NECK FINDINGS Aortic arch: Aortic atherosclerotic calcification. No aneurysm or dissection. Right carotid system: Right common carotid artery widely patent to the bifurcation. Calcified plaque at the carotid bifurcation and ICA bulb but no stenosis. Cervical ICA mildly irregular but patent to the skull base. Left carotid system: Common carotid artery widely patent to the bifurcation. Extensive calcified plaque at the carotid bifurcation and ICA bulb but no stenosis. Cervical ICA widely patent to the skull base. Vertebral arteries: Calcified plaque at the right vertebral artery origin with 30% stenosis. Left vertebral artery origin widely patent. Both vertebral arteries widely patent beyond that through the cervical region to the foramen magnum. Skeleton: Ordinary spondylosis. Other neck: No mass or lymphadenopathy. Upper chest: Normal Review of the MIP images confirms the above findings CTA HEAD FINDINGS Anterior circulation: Both internal carotid arteries are patent through the skull base and siphon regions. There is atherosclerotic calcification in the carotid siphon regions but no stenosis greater than 30%. On the left, the anterior and middle cerebral vessels appear normal. On the right, there is embolic occlusion of the superior segment M2 branch. Posterior circulation: Both vertebral arteries are patent through the foramen magnum to the basilar. There is atherosclerotic disease in both V4 segments with stenoses estimated at 30-50%. No basilar stenosis. Posterior circulation branch vessels are patent. Venous sinuses: Patent and normal. Anatomic variants: None significant. Review of the MIP images confirms the above  findings IMPRESSION: Embolic occlusion of the superior division M2 branch on the right. Atherosclerotic disease at both carotid bifurcation regions but no sign of flow limiting stenosis. On the initial CT, I probably underestimated the aspects score. I think there are some early changes higher than the temporal lobe, aspects probably 8 rather than 9. Case discussed with Dr. LCheral Markerduring interpretation. Electronically Signed   By: MNelson ChimesM.D.   On: 01/24/2020 20:44   MR BRAIN WO CONTRAST  Result Date: 01/25/2020 CLINICAL DATA:  69year old male code stroke presentation yesterday, received IV tPA. Right MCA M2 occlusion treated with endovascular thrombectomy. EXAM: MRI HEAD WITHOUT CONTRAST TECHNIQUE: Multiplanar, multiecho pulse sequences of the brain and surrounding structures were obtained without intravenous contrast. COMPARISON:  CT head, CTA and CTP yesterday. FINDINGS: Brain: Patchy, confluent posterior right temporal lobe gray and white matter restricted diffusion corresponding to that evident by plain CT yesterday. Superimposed similar confluent infarct in the right operculum (series 7, image 52). Some involvement of the insula. Restricted diffusion  here also tracks cephalad along the perirolandic cortex to the level of upper extremity representation. Additional smaller area of right superior frontal gyrus pre motor cortical restriction. Additional focus of right corona radiata involvement. T2 and FLAIR hyperintensity with petechial hemorrhage in the posterior right temporal lobe (Heidelberg Classification 1 A series 12, image 28). No other cerebral blood products identified. No significant mass effect. Small area of what appears to be chronic cortical encephalomalacia in the right parietal lobe on series 9, image 18. Pear[place] Cables and white matter signal in the left hemisphere and posterior fossa is normal for age. No midline shift, ventriculomegaly, extra-axial collection. Cervicomedullary junction and  pituitary are within normal limits. Vascular: Major intracranial vascular flow voids are preserved. Skull and upper cervical spine: Partially visible cervical spine degeneration at C3-C4. Visualized bone marrow signal is within normal limits. Sinuses/Orbits: Negative orbits. Mucosal thickening and small fluid levels in the ethmoid and sphenoid sinuses. Other: Trace mastoid fluid. Visible internal auditory structures appear normal. Incidental benign appearing right occipital scalp lipoma (series 14, image 6). IMPRESSION: 1. Patchy and scattered acute right MCA territory infarct. Heidelberg Classification 1a petechial blood in the right temporal lobe. No significant mass effect. 2. Suggestion of a small area of chronic right parietal lobe cortical encephalomalacia. Negative for age noncontrast MRI appearance of the brain elsewhere. Electronically Signed   By: Genevie Ann M.D.   On: 01/25/2020 21:26   IR CT Head Ltd  Result Date: 01/25/2020 INDICATION: New onset left-sided facial droop, left-sided weakness arm greater than leg with right gaze deviation.  EXAM: 1. EMERGENT LARGE VESSEL OCCLUSION THROMBOLYSIS (anterior CIRCULATION)  COMPARISON:  CT angiogram of the head and neck of January 24, 2020.  MEDICATIONS: Ancef 2 g IV antibiotic was administered within 1 hour of the procedure.  ANESTHESIA/SEDATION: General anesthesia.  CONTRAST:  Isovue 300 approximately 100 mL.  FLUOROSCOPY TIME:  Fluoroscopy Time: 67 minutes 32 seconds (2309 mGy).  COMPLICATIONS: None immediate.  TECHNIQUE: Following a full explanation of the procedure along with the potential associated complications, an informed witnessed consent was obtained. The risks of intracranial hemorrhage of 10%, worsening neurological deficit, ventilator dependency, death and inability to revascularize were all reviewed in detail with the patient's wife.  The patient was then put under general anesthesia by the Department of Anesthesiology at [facility].  The right groin was prepped and draped in the usual sterile fashion. Thereafter using modified Seldinger technique, transfemoral access into the right common femoral artery was obtained without difficulty. Over a 0.035 inch guidewire a 8 French Pinnacle sheath was inserted. Through this, and also over a 0.035 inch guidewire a 5 [place] JB 1 catheter was advanced to the aortic arch region and selectively positioned in the innominate artery and the right common carotid artery.  FINDINGS: The innominate arteriogram demonstrates the origin of the right subclavian artery and the right common carotid artery to be widely patent.  The right vertebral artery origin is patent. The vessel is seen to opacify to the cranial skull base. Opacification is seen of the right vertebrobasilar junction and the right posterior-inferior cerebellar artery.  The right common carotid arteriogram demonstrates the right external carotid artery and its major branches to be widely patent.  The right internal carotid artery at the bulb to the cranial skull base demonstrates wide patency.  The petrous, cavernous and the supraclinoid segments are widely patent.  The right posterior communicating artery is seen transiently opacifying the right posterior cerebral artery distribution.  The right  anterior cerebral artery opacifies into the capillary and venous phases. The right anterior cerebral artery M1 segment is widely patent.  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 The delayed arterial and capillary phase demonstrates a large area of hypoperfusion involving the posterior right middle cerebral artery distribution.  PROCEDURE: The diagnostic JB 1 catheter in right common carotid artery was exchanged over a 0.035 inch 300 cm Rosen exchange guidewire for an 087 balloon guide catheter which had been prepped with 50% contrast and 50% heparinized  saline infusion and advanced to the mid cervical right ICA.  The guidewire was removed. Good aspiration obtained from the hub of the balloon guide catheter. A control arteriogram performed through this demonstrated no evidence of spasms, dissections or of intraluminal filling defects.  Over a 0.014 inch standard Synchro micro guidewire, the combination of a Trevo ProVue microcatheter inside of a 6 French 132 cm Catalyst guide catheter was advanced without difficulty to the supraclinoid right ICA.  The micro guidewire was then gently manipulated and advanced through the occluded dominant inferior division into the M2 M3 region followed by the microcatheter. The guidewire was removed. Good aspiration obtained from the hub of the microcatheter. Gentle control arteriogram performed through the microcatheter demonstrated safe position of the tip of the microcatheter.  A 4 mm x 40 mm Solitaire X retrieval device was advanced to the distal tip of the microcatheter. The O ring on the delivery microcatheter was then loosened. The retrieval device was then deployed in the usual manner.  The 6 [place] Catalyst guide catheter was advanced into the proximal right M1 segment initially.  With proximal flow arrest in the right internal carotid artery with the balloon guide catheter, and constant aspiration being applied at the hub of the 6 [place] Catalyst guide catheter which had now been advanced right at the origin of the occluded inferior division using a Penumbra aspiration device over 2 minutes, and with a 60 mL syringe at the hub of the balloon guide catheter, the combination was retrieved and removed. A control arteriogram performed following reversal of flow arrest in the right internal carotid artery demonstrated no significant alteration in the occluded right dominant inferior division.  A second pass was then made again using the above combination again after having verified safe position of tip of the  microcatheter and inferior branch of the right middle cerebral artery M2 M3 region, the 4 mm x 40 mm Solitaire X retrieval device was again deployed as above. Again with proximal flow arrest in the right internal carotid artery, and with Penumbra aspiration through the hub of the 6 [place] Catalyst guide catheter right now just inside the origin of the occluded branch over approximately 2 minutes, the combination was retrieved and removed as constant aspiration at the hub of the balloon guide catheter. Following reversal of flow arrest, a control arteriogram performed through the balloon guide catheter again demonstrated no significant alteration in the occluded inferior division occlusion.  A third pass was then made again using the Trevo ProVue microcatheter combination inside of a Catalyst guide catheter over a 0.014 inch standard Synchro micro guidewire. Again the microcatheter was advanced without difficulty into the M2 M3 segment of the inferior division over the micro guidewire. A safe position was then confirmed with aspiration and gentle contrast injection. At this time a 3 mm x 32 mm Trevo ProVue device was advanced to the distal end of the microcatheter.  This was again deployed in the usual manner and left for approximately 3 minutes as constant aspiration was applied at the hub of the 6 [place] Catalyst guide catheter locked into position just inside the origin of the inferior division. With proximal flow arrest, and constant aspiration at the hub of the 6 [place] Catalyst guide catheter, and with a 60 mL syringe at the hub of the balloon guide catheter, the combination of the retrieval device, the microcatheter and the 6 [place] Catalyst guide catheter were retrieved and removed. Clot was seen entangled within the cells of the retrieval device.  Following reversal of flow arrest, a control arteriogram performed through the balloon guide catheter now demonstrated near complete revascularization of the  inferior division. A TICI 2b revascularization was achieved. The delayed arterial phase continued to demonstrate retrograde opacification of the anterior peri-insular branches in the M3 M4 region. However, there continued to be a moderate sized area of hypoperfusion subcortical region in the posterior operculum and the frontal parietal regions. This corresponds to the area of core abnormalities seen on CT perfusion maps earlier.  The right anterior cerebral artery and the right posterior communicating arteries remain patent.  Throughout the procedure, the patient's blood pressure and neurological status remained stable.  No evidence of extravasation or mass-effect or midline shift was seen.  CT of the brain demonstrated contrast stain involving two posterior temporoparietal areas associated with a small focal area of subarachnoid leakage. However, no evidence of mass effect or midline shift was seen on the flat panel CT of the brain. An 8 [place] working sheath was left in situ and connected to continuous heparinized saline infusion.  The patient was left intubated for airway protection. He was then transferred to the neuro ICU for post thrombectomy management.  IMPRESSION: Status post revascularization of occluded dominant inferior division of the right middle cerebral artery with 2 passes with the Solitaire 4 mm x 40 mm X retrieval device, and 1 pass with the 3 mm x 32 mm Trevo ProVue retrieval device and Penumbra aspiration achieving a TICI 2b revascularization.  PLAN: Follow-up in the clinic 4 weeks post discharge.   Electronically Signed   By: [name] M.D.   On: 01/25/2020 10:29   CT CEREBRAL PERFUSION W CONTRAST  Result Date: 01/24/2020 CLINICAL DATA:  Left-sided weakness and neglect. EXAM: CT PERFUSION BRAIN TECHNIQUE: Multiphase CT imaging of the brain was performed following IV bolus contrast injection. Subsequent parametric perfusion maps were calculated using RAPID software.  CONTRAST:  22m OMNIPAQUE IOHEXOL 350 MG/ML SOLN COMPARISON:  CT head and CT angiography earlier same day. FINDINGS: CT Brain Perfusion Findings: CBF (<30%) Volume: 457mPerfusion (Tmax>6.0s) volume: 1185mismatch Volume: 34m2mPECTS on noncontrast CT Head: 8 at 2030 hours today. Infarct Core: 46 mL demonstrated in the frontoparietal brain. Completed infarction in the right temporal lobe not shown because of luxury perfusion. Infarction Location:Completed infarction in the right temporal lobe with luxury perfusion. New or completed infarction in the right frontoparietal brain. Additional 72 cc of at risk brain in the right MCA territory. IMPRESSION: Early subacute infarction in the right temporal lobe with pseudo normalization. Acute infarction at the right frontoparietal junction, 46 cc volume. Additional 72 cc of at risk brain in the right MCA territory. Results reported to Dr. LindCheral Markertext message at approximately 2055 hours. Electronically Signed   By: MarkNelson Chimes.   On: 01/24/2020 21:03   ECHOCARDIOGRAM COMPLETE  Result Date: 01/25/2020    ECHOCARDIOGRAM REPORT  Patient Name:   Ruben Chavez Date of Exam: 01/25/2020 Medical Rec #:  [id number]         Height:       69.0 in Accession #:    [id number]        Weight:       180.0 lb Date of Birth:  1951-02-17          BSA:          1.98 m Patient Age:    43 years          BP:           119/53 mmHg Patient Gender: M                 HR:           60 bpm. Exam Location:  Inpatient Procedure: 2D Echo Indications:    Stroke 434.91 / I163.9  History:        Patient has no prior history of Echocardiogram examinations.                 Signs/Symptoms:Hypotension; Risk Factors:Hypertension and                 Dyslipidemia.  Sonographer:    Vikki Ports Turrentine Referring Phys: [phone] ERIC LINDZEN  Sonographer Comments: Echo performed with patient supine and on artificial respirator. IMPRESSIONS  1. Left ventricular ejection fraction, by estimation, is 60 to 65%.  The left ventricle has normal function. LV endocardial border not optimally defined to evaluate regional wall motion. Left ventricular diastolic parameters are consistent with Grade I diastolic dysfunction (impaired relaxation).  2. Right ventricular systolic function is normal. The right ventricular size is normal. Tricuspid regurgitation signal is inadequate for assessing PA pressure.  3. The mitral valve is grossly normal. No evidence of mitral valve regurgitation. No evidence of mitral stenosis.  4. The aortic valve is tricuspid. Aortic valve regurgitation is not visualized. No aortic stenosis is present. Comparison(s): No prior Echocardiogram. FINDINGS  Left Ventricle: Left ventricular ejection fraction, by estimation, is 60 to 65%. The left ventricle has normal function. LV endocardial border not optimally defined to evaluate regional wall motion. The left ventricular internal cavity size was normal in size. There is no left ventricular hypertrophy. Left ventricular diastolic parameters are consistent with Grade I diastolic dysfunction (impaired relaxation). Right Ventricle: The right ventricular size is normal. No increase in right ventricular wall thickness. Right ventricular systolic function is normal. Tricuspid regurgitation signal is inadequate for assessing PA pressure. Left Atrium: Left atrial size was normal in size. Right Atrium: Right atrial size was normal in size. Pericardium: There is no evidence of pericardial effusion. Mitral Valve: The mitral valve is grossly normal. No evidence of mitral valve regurgitation. No evidence of mitral valve stenosis. Tricuspid Valve: The tricuspid valve is grossly normal. Tricuspid valve regurgitation is not demonstrated. Aortic Valve: The aortic valve is tricuspid. Aortic valve regurgitation is not visualized. No aortic stenosis is present. Pulmonic Valve: The pulmonic valve was grossly normal. Pulmonic valve regurgitation is not visualized. Aorta: The aortic  root is normal in size and structure. Venous: The inferior vena cava was not well visualized. IAS/Shunts: No atrial level shunt detected by color flow Doppler.  LEFT VENTRICLE PLAX 2D LVIDd:         3.79 cm  Diastology LVIDs:         2.87 cm  LV e' lateral:   13.50 cm/s LV PW:  0.96 cm  LV E/e' lateral: 5.1 LV IVS:        1.00 cm  LV e' medial:    9.90 cm/s LVOT diam:     2.10 cm  LV E/e' medial:  6.9 LV SV:         89.71 ml LV SV Index:   15.03 LVOT Area:     3.46 cm  RIGHT VENTRICLE RV S prime:     20.50 cm/s TAPSE (M-mode): 2.1 cm RIGHT ATRIUM           Index RA Area:     15.30 cm RA Volume:   35.00 ml  17.72 ml/m  AORTIC VALVE LVOT Vmax:   118.00 cm/s LVOT Vmean:  90.000 cm/s LVOT VTI:    0.259 m  AORTA Ao Root diam: 3.60 cm MITRAL VALVE MV Area (PHT): 2.76 cm    SHUNTS MV Decel Time: 275 msec    Systemic VTI:  0.26 m MV E velocity: 68.60 cm/s  Systemic Diam: 2.10 cm MV A velocity: 85.30 cm/s MV E/A ratio:  0.80 Eleonore Chiquito MD Electronically signed by Eleonore Chiquito MD Signature Date/Time: 01/25/2020/4:26:55 PM    Final    ECHO TEE  Result Date: 01/28/2020    TRANSESOPHOGEAL ECHO REPORT   Patient Name:   Ruben Chavez Date of Exam: 01/28/2020 Medical Rec #:  [id number]         Height:       69.0 in Accession #:    [id number]        Weight:       182.0 lb Date of Birth:  04/03/1951          BSA:          1.985 m Patient Age:    65 years          BP:           134/88 mmHg Patient Gender: M                 HR:           88 bpm. Exam Location:  Inpatient Procedure: Transesophageal Echo Indications:    CVA  History:        Patient has prior history of Echocardiogram examinations.                 Stroke.  Sonographer:    Mikki Santee RDCS (AE) Referring Phys: [id number] [place]: The transesophogeal probe was passed without difficulty through the esophogus of the patient. Sedation performed by different physician. The patient's vital signs; including heart rate, blood pressure, and  oxygen saturation; remained stable throughout the procedure. The patient developed no complications during the procedure. IMPRESSIONS  1. No source of embolus Proceed with ILR.  2. Left ventricular ejection fraction, by estimation, is 60 to 65%. The left ventricle has normal function. The left ventricle has no regional wall motion abnormalities. Left ventricular diastolic function could not be evaluated. Left ventricular diastolic function could not be evaluated.  3. Right ventricular systolic function is normal. The right ventricular size is normal.  4. No left atrial/left atrial appendage thrombus was detected.  5. The mitral valve is normal in structure and function. Trivial mitral valve regurgitation. No evidence of mitral stenosis.  6. The aortic valve is normal in structure and function. Aortic valve regurgitation is not visualized. No aortic stenosis is present.  7. The inferior vena cava is normal in size with greater than 50% respiratory  variability, suggesting right atrial pressure of 3 mmHg.  8. Agitated saline contrast bubble study was negative, with no evidence of any interatrial shunt. Conclusion(s)/Recommendation(s): Normal biventricular function without evidence of hemodynamically significant valvular heart disease. FINDINGS  Left Ventricle: Left ventricular ejection fraction, by estimation, is 60 to 65%. The left ventricle has normal function. The left ventricle has no regional wall motion abnormalities. The left ventricular internal cavity size was normal in size. There is  no left ventricular hypertrophy. Left ventricular diastolic function could not be evaluated. Right Ventricle: The right ventricular size is normal. No increase in right ventricular wall thickness. Right ventricular systolic function is normal. Left Atrium: Left atrial size was normal in size. No left atrial/left atrial appendage thrombus was detected. Right Atrium: Right atrial size was normal in size. Pericardium: There is no  evidence of pericardial effusion. Mitral Valve: The mitral valve is normal in structure and function. Normal mobility of the mitral valve leaflets. Trivial mitral valve regurgitation. No evidence of mitral valve stenosis. Tricuspid Valve: The tricuspid valve is normal in structure. Tricuspid valve regurgitation is mild . No evidence of tricuspid stenosis. Aortic Valve: The aortic valve is normal in structure and function. Aortic valve regurgitation is not visualized. No aortic stenosis is present. Pulmonic Valve: The pulmonic valve was normal in structure. Pulmonic valve regurgitation is mild. No evidence of pulmonic stenosis. Aorta: The aortic root is normal in size and structure. Venous: The inferior vena cava is normal in size with greater than 50% respiratory variability, suggesting right atrial pressure of 3 mmHg. IAS/Shunts: No atrial level shunt detected by color flow Doppler. Agitated saline contrast was given intravenously to evaluate for intracardiac shunting. Agitated saline contrast bubble study was negative, with no evidence of any interatrial shunt. Additional Comments: No source of embolus Proceed with ILR. Jenkins Rouge MD Electronically signed by Jenkins Rouge MD Signature Date/Time: 01/28/2020/2:09:41 PM    Final    IR PERCUTANEOUS ART THROMBECTOMY/INFUSION INTRACRANIAL INC DIAG ANGIO  Result Date: 01/28/2020 INDICATION: New onset left-sided facial droop, left-sided weakness arm greater than leg with right gaze deviation. EXAM: 1. EMERGENT LARGE VESSEL OCCLUSION THROMBOLYSIS (anterior CIRCULATION) COMPARISON:  CT angiogram of the head and neck of January 24, 2020. MEDICATIONS: Ancef 2 g IV antibiotic was administered within 1 hour of the procedure. ANESTHESIA/SEDATION: General anesthesia. CONTRAST:  Isovue 300 approximately 100 mL. FLUOROSCOPY TIME:  Fluoroscopy Time: 67 minutes 32 seconds (2309 mGy). COMPLICATIONS: None immediate. TECHNIQUE: Following a full explanation of the procedure along  with the potential associated complications, an informed witnessed consent was obtained. The risks of intracranial hemorrhage of 10%, worsening neurological deficit, ventilator dependency, death and inability to revascularize were all reviewed in detail with the patient's wife. The patient was then put under general anesthesia by the Department of Anesthesiology at [facility]. The right groin was prepped and draped in the usual sterile fashion. Thereafter using modified Seldinger technique, transfemoral access into the right common femoral artery was obtained without difficulty. Over a 0.035 inch guidewire a 8 French Pinnacle sheath was inserted. Through this, and also over a 0.035 inch guidewire a 5 [place] JB 1 catheter was advanced to the aortic arch region and selectively positioned in the innominate artery and the right common carotid artery. FINDINGS: The innominate arteriogram demonstrates the origin of the right subclavian artery and the right common carotid artery to be widely patent. The right vertebral artery origin is patent. The vessel is seen to opacify to the cranial skull base. Opacification  is seen of the right vertebrobasilar junction and the right posterior-inferior cerebellar artery. The right common carotid arteriogram demonstrates the right external carotid artery and its major branches to be widely patent. The right internal carotid artery at the bulb to the cranial skull base demonstrates wide patency. The petrous, cavernous and the supraclinoid segments are widely patent. The right posterior communicating artery is seen transiently opacifying the right posterior cerebral artery distribution. The right anterior cerebral artery opacifies into the capillary and venous phases. The right anterior cerebral artery M1 segment is widely patent. 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 The delayed arterial and  capillary phase demonstrates a large area of hypoperfusion involving the posterior right middle cerebral artery distribution. PROCEDURE: The diagnostic JB 1 catheter in right common carotid artery was exchanged over a 0.035 inch 300 cm Rosen exchange guidewire for an 087 balloon guide catheter which had been prepped with 50% contrast and 50% heparinized saline infusion and advanced to the mid cervical right ICA. The guidewire was removed. Good aspiration obtained from the hub of the balloon guide catheter. A control arteriogram performed through this demonstrated no evidence of spasms, dissections or of intraluminal filling defects. Over a 0.014 inch standard Synchro micro guidewire, the combination of a Trevo ProVue microcatheter inside of a 6 French 132 cm Catalyst guide catheter was advanced without difficulty to the supraclinoid right ICA. The micro guidewire was then gently manipulated and advanced through the occluded dominant inferior division into the M2 M3 region followed by the microcatheter. The guidewire was removed. Good aspiration obtained from the hub of the microcatheter. Gentle control arteriogram performed through the microcatheter demonstrated safe position of the tip of the microcatheter. A 4 mm x 40 mm Solitaire X retrieval device was advanced to the distal tip of the microcatheter. The O ring on the delivery microcatheter was then loosened. The retrieval device was then deployed in the usual manner. The 6 [place] Catalyst guide catheter was advanced into the proximal right M1 segment initially. With proximal flow arrest in the right internal carotid artery with the balloon guide catheter, and constant aspiration being applied at the hub of the 6 [place] Catalyst guide catheter which had now been advanced right at the origin of the occluded inferior division using a Penumbra aspiration device over 2 minutes, and with a 60 mL syringe at the hub of the balloon guide catheter, the combination was  retrieved and removed. A control arteriogram performed following reversal of flow arrest in the right internal carotid artery demonstrated no significant alteration in the occluded right dominant inferior division. A second pass was then made again using the above combination again after having verified safe position of tip of the microcatheter and inferior branch of the right middle cerebral artery M2 M3 region, the 4 mm x 40 mm Solitaire X retrieval device was again deployed as above. Again with proximal flow arrest in the right internal carotid artery, and with Penumbra aspiration through the hub of the 6 [place] Catalyst guide catheter right now just inside the origin of the occluded branch over approximately 2 minutes, the combination was retrieved and removed as constant aspiration at the hub of the balloon guide catheter. Following reversal of flow arrest, a control arteriogram performed through the balloon guide catheter again demonstrated no significant alteration in the occluded inferior division occlusion. A third pass was then made again using the Trevo ProVue microcatheter combination inside of  a Catalyst guide catheter over a 0.014 inch standard Synchro micro guidewire. Again the microcatheter was advanced without difficulty into the M2 M3 segment of the inferior division over the micro guidewire. A safe position was then confirmed with aspiration and gentle contrast injection. At this time a 3 mm x 32 mm Trevo ProVue device was advanced to the distal end of the microcatheter. This was again deployed in the usual manner and left for approximately 3 minutes as constant aspiration was applied at the hub of the 6 [place] Catalyst guide catheter locked into position just inside the origin of the inferior division. With proximal flow arrest, and constant aspiration at the hub of the 6 [place] Catalyst guide catheter, and with a 60 mL syringe at the hub of the balloon guide catheter, the combination of the  retrieval device, the microcatheter and the 6 [place] Catalyst guide catheter were retrieved and removed. Clot was seen entangled within the cells of the retrieval device. Following reversal of flow arrest, a control arteriogram performed through the balloon guide catheter now demonstrated near complete revascularization of the inferior division. A TICI 2b revascularization was achieved. The delayed arterial phase continued to demonstrate retrograde opacification of the anterior peri-insular branches in the M3 M4 region. However, there continued to be a moderate sized area of hypoperfusion subcortical region in the posterior operculum and the frontal parietal regions. This corresponds to the area of core abnormalities seen on CT perfusion maps earlier. The right anterior cerebral artery and the right posterior communicating arteries remain patent. Throughout the procedure, the patient's blood pressure and neurological status remained stable. No evidence of extravasation or mass-effect or midline shift was seen. CT of the brain demonstrated contrast stain involving two posterior temporoparietal areas associated with a small focal area of subarachnoid leakage. However, no evidence of mass effect or midline shift was seen on the flat panel CT of the brain. An 8 [place] working sheath was left in situ and connected to continuous heparinized saline infusion. The patient was left intubated for airway protection. He was then transferred to the neuro ICU for post thrombectomy management. IMPRESSION: Status post revascularization of occluded dominant inferior division of the right middle cerebral artery with 2 passes with the Solitaire 4 mm x 40 mm X retrieval device, and 1 pass with the 3 mm x 32 mm Trevo ProVue retrieval device and Penumbra aspiration achieving a TICI 2b revascularization. PLAN: Follow-up in the clinic 4 weeks post discharge. Electronically Signed   By: [name] M.D.   On: 01/25/2020 10:29   CT  HEAD CODE STROKE WO CONTRAST  Addendum Date: 01/24/2020   ADDENDUM REPORT: 01/24/2020 20:49 ADDENDUM: I think that the initial interpretation underestimated the disease. Whereas there is a subacute infarction in the temporal lobe, there are probably more acute changes with loss of gray-white differentiation above that in the frontoparietal region, making the aspects 8 rather than 9. Electronically Signed   By: Nelson Chimes M.D.   On: 01/24/2020 20:49   Result Date: 01/24/2020 CLINICAL DATA:  Code stroke.  Left-sided weakness. EXAM: CT HEAD WITHOUT CONTRAST TECHNIQUE: Contiguous axial images were obtained from the base of the skull through the vertex without intravenous contrast. COMPARISON:  None. FINDINGS: Brain: Subacute infarction in the right lateral temporal lobe. Mild swelling but no hemorrhage. No other acute finding. Elsewhere, there are mild chronic small-vessel changes of the white matter. No mass, hydrocephalus or extra-axial collection. Vascular: There is atherosclerotic calcification of the major vessels at the  base of the brain. Skull: Negative Sinuses/Orbits: Clear/normal Other: None ASPECTS ([place] Stroke Program Early CT Score) - Ganglionic level infarction (caudate, lentiform nuclei, internal capsule, insula, M1-M3 cortex): 6 - Supraganglionic infarction (M4-M6 cortex): 3 Total score (0-10 with 10 being normal): 9 IMPRESSION: 1. Subacute infarction in the right lateral temporal lobe. No hemorrhage or mass effect. 2. ASPECTS is 9 3. These results were communicated to Dr. Cheral Marker at [place] 2/18/2021by text page via the [facility] messaging system. Electronically Signed: By: Nelson Chimes M.D. On: 01/24/2020 20:22   VAS [place] LOWER EXTREMITY VENOUS (DVT)  Result Date: 01/26/2020  Lower Venous DVTStudy Indications: Stroke.  Comparison Study: No prior study on file for comparison Performing Technologist: Sharion Dove RVS  Examination Guidelines: A complete evaluation includes B-mode imaging,  spectral Doppler, color Doppler, and power Doppler as needed of all accessible portions of each vessel. Bilateral testing is considered an integral part of a complete examination. Limited examinations for reoccurring indications may be performed as noted. The reflux portion of the exam is performed with the patient in reverse Trendelenburg.  +---------+---------------+---------+-----------+----------+--------------+ RIGHT    CompressibilityPhasicitySpontaneityPropertiesThrombus Aging +---------+---------------+---------+-----------+----------+--------------+ CFV      Full           Yes      Yes                                 +---------+---------------+---------+-----------+----------+--------------+ SFJ      Full                                                        +---------+---------------+---------+-----------+----------+--------------+ FV Prox  Full                                                        +---------+---------------+---------+-----------+----------+--------------+ FV Mid   Full                                                        +---------+---------------+---------+-----------+----------+--------------+ FV DistalFull                                                        +---------+---------------+---------+-----------+----------+--------------+ PFV      Full                                                        +---------+---------------+---------+-----------+----------+--------------+ POP      Full           Yes      Yes                                 +---------+---------------+---------+-----------+----------+--------------+  PTV      Full                                                        +---------+---------------+---------+-----------+----------+--------------+ PERO     Full                                                        +---------+---------------+---------+-----------+----------+--------------+    +---------+---------------+---------+-----------+----------+--------------+ LEFT     CompressibilityPhasicitySpontaneityPropertiesThrombus Aging +---------+---------------+---------+-----------+----------+--------------+ CFV      Full           Yes      Yes                                 +---------+---------------+---------+-----------+----------+--------------+ SFJ      Full                                                        +---------+---------------+---------+-----------+----------+--------------+ FV Prox  Full                                                        +---------+---------------+---------+-----------+----------+--------------+ FV Mid   Full                                                        +---------+---------------+---------+-----------+----------+--------------+ FV DistalFull                                                        +---------+---------------+---------+-----------+----------+--------------+ PFV      Full                                                        +---------+---------------+---------+-----------+----------+--------------+ POP      Full           Yes      Yes                                 +---------+---------------+---------+-----------+----------+--------------+ PTV      Full                                                        +---------+---------------+---------+-----------+----------+--------------+  PERO     Full                                                        +---------+---------------+---------+-----------+----------+--------------+     Summary: BILATERAL: - No evidence of deep vein thrombosis seen in the lower extremities, bilaterally.   *See table(s) above for measurements and observations. Electronically signed by Deitra Mayo MD on 01/26/2020 at 4:57:01 PM.    Final    IR ANGIO VERTEBRAL SEL SUBCLAVIAN INNOMINATE UNI R MOD SED  Result Date: 01/25/2020 INDICATION:  New onset left-sided facial droop, left-sided weakness arm greater than leg with right gaze deviation.  EXAM: 1. EMERGENT LARGE VESSEL OCCLUSION THROMBOLYSIS (anterior CIRCULATION)  COMPARISON:  CT angiogram of the head and neck of January 24, 2020.  MEDICATIONS: Ancef 2 g IV antibiotic was administered within 1 hour of the procedure.  ANESTHESIA/SEDATION: General anesthesia.  CONTRAST:  Isovue 300 approximately 100 mL.  FLUOROSCOPY TIME:  Fluoroscopy Time: 67 minutes 32 seconds (2309 mGy).  COMPLICATIONS: None immediate.  TECHNIQUE: Following a full explanation of the procedure along with the potential associated complications, an informed witnessed consent was obtained. The risks of intracranial hemorrhage of 10%, worsening neurological deficit, ventilator dependency, death and inability to revascularize were all reviewed in detail with the patient's wife.  The patient was then put under general anesthesia by the Department of Anesthesiology at [facility].  The right groin was prepped and draped in the usual sterile fashion. Thereafter using modified Seldinger technique, transfemoral access into the right common femoral artery was obtained without difficulty. Over a 0.035 inch guidewire a 8 French Pinnacle sheath was inserted. Through this, and also over a 0.035 inch guidewire a 5 [place] JB 1 catheter was advanced to the aortic arch region and selectively positioned in the innominate artery and the right common carotid artery.  FINDINGS: The innominate arteriogram demonstrates the origin of the right subclavian artery and the right common carotid artery to be widely patent.  The right vertebral artery origin is patent. The vessel is seen to opacify to the cranial skull base. Opacification is seen of the right vertebrobasilar junction and the right posterior-inferior cerebellar artery.  The right common carotid arteriogram demonstrates the right external carotid artery and its major branches  to be widely patent.  The right internal carotid artery at the bulb to the cranial skull base demonstrates wide patency.  The petrous, cavernous and the supraclinoid segments are widely patent.  The right posterior communicating artery is seen transiently opacifying the right posterior cerebral artery distribution.  The right anterior cerebral artery opacifies into the capillary and venous phases. The right anterior cerebral artery M1 segment is widely patent.  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 The delayed arterial and capillary phase demonstrates a large area of hypoperfusion involving the posterior right middle cerebral artery distribution.  PROCEDURE: The diagnostic JB 1 catheter in right common carotid artery was exchanged over a 0.035 inch 300 cm Rosen exchange guidewire for an 087 balloon guide catheter which had been prepped with 50% contrast and 50% heparinized saline infusion and advanced to the mid cervical right ICA.  The guidewire was removed. Good aspiration obtained from the hub of the balloon guide catheter. A control arteriogram  performed through this demonstrated no evidence of spasms, dissections or of intraluminal filling defects.  Over a 0.014 inch standard Synchro micro guidewire, the combination of a Trevo ProVue microcatheter inside of a 6 French 132 cm Catalyst guide catheter was advanced without difficulty to the supraclinoid right ICA.  The micro guidewire was then gently manipulated and advanced through the occluded dominant inferior division into the M2 M3 region followed by the microcatheter. The guidewire was removed. Good aspiration obtained from the hub of the microcatheter. Gentle control arteriogram performed through the microcatheter demonstrated safe position of the tip of the microcatheter.  A 4 mm x 40 mm Solitaire X retrieval device was advanced to the distal tip of the  microcatheter. The O ring on the delivery microcatheter was then loosened. The retrieval device was then deployed in the usual manner.  The 6 [place] Catalyst guide catheter was advanced into the proximal right M1 segment initially.  With proximal flow arrest in the right internal carotid artery with the balloon guide catheter, and constant aspiration being applied at the hub of the 6 [place] Catalyst guide catheter which had now been advanced right at the origin of the occluded inferior division using a Penumbra aspiration device over 2 minutes, and with a 60 mL syringe at the hub of the balloon guide catheter, the combination was retrieved and removed. A control arteriogram performed following reversal of flow arrest in the right internal carotid artery demonstrated no significant alteration in the occluded right dominant inferior division.  A second pass was then made again using the above combination again after having verified safe position of tip of the microcatheter and inferior branch of the right middle cerebral artery M2 M3 region, the 4 mm x 40 mm Solitaire X retrieval device was again deployed as above. Again with proximal flow arrest in the right internal carotid artery, and with Penumbra aspiration through the hub of the 6 [place] Catalyst guide catheter right now just inside the origin of the occluded branch over approximately 2 minutes, the combination was retrieved and removed as constant aspiration at the hub of the balloon guide catheter. Following reversal of flow arrest, a control arteriogram performed through the balloon guide catheter again demonstrated no significant alteration in the occluded inferior division occlusion.  A third pass was then made again using the Trevo ProVue microcatheter combination inside of a Catalyst guide catheter over a 0.014 inch standard Synchro micro guidewire. Again the microcatheter was advanced without difficulty into the M2 M3 segment of the inferior division  over the micro guidewire. A safe position was then confirmed with aspiration and gentle contrast injection. At this time a 3 mm x 32 mm Trevo ProVue device was advanced to the distal end of the microcatheter. This was again deployed in the usual manner and left for approximately 3 minutes as constant aspiration was applied at the hub of the 6 [place] Catalyst guide catheter locked into position just inside the origin of the inferior division. With proximal flow arrest, and constant aspiration at the hub of the 6 [place] Catalyst guide catheter, and with a 60 mL syringe at the hub of the balloon guide catheter, the combination of the retrieval device, the microcatheter and the 6 [place] Catalyst guide catheter were retrieved and removed. Clot was seen entangled within the cells of the retrieval device.  Following reversal of flow arrest, a control arteriogram performed through the balloon guide catheter now demonstrated near complete revascularization of the inferior division. A TICI 2b  revascularization was achieved. The delayed arterial phase continued to demonstrate retrograde opacification of the anterior peri-insular branches in the M3 M4 region. However, there continued to be a moderate sized area of hypoperfusion subcortical region in the posterior operculum and the frontal parietal regions. This corresponds to the area of core abnormalities seen on CT perfusion maps earlier.  The right anterior cerebral artery and the right posterior communicating arteries remain patent.  Throughout the procedure, the patient's blood pressure and neurological status remained stable.  No evidence of extravasation or mass-effect or midline shift was seen.  CT of the brain demonstrated contrast stain involving two posterior temporoparietal areas associated with a small focal area of subarachnoid leakage. However, no evidence of mass effect or midline shift was seen on the flat panel CT of the brain. An 8 [place] working sheath  was left in situ and connected to continuous heparinized saline infusion.  The patient was left intubated for airway protection. He was then transferred to the neuro ICU for post thrombectomy management.  IMPRESSION: Status post revascularization of occluded dominant inferior division of the right middle cerebral artery with 2 passes with the Solitaire 4 mm x 40 mm X retrieval device, and 1 pass with the 3 mm x 32 mm Trevo ProVue retrieval device and Penumbra aspiration achieving a TICI 2b revascularization.  PLAN: Follow-up in the clinic 4 weeks post discharge.   Electronically Signed   By: [name] M.D.   On: 01/25/2020 10:29       HISTORY OF PRESENT ILLNESS VIRGIE CHERY is an 69 y.o. male presenting acutely to the [facility] ED after acute onset of left sided weakness at home. He was sitting on a couch without symptoms when he suddenly slid and fell off the couch at on 01/24/2020 at 1900. Wife noted that he was weak on the left side, so she called EMS. On arrival, EMS noted left facial droop, left hemiplegia and dysarthria. Code Stroke was called in the field. BP was 180/120 per EMS. CBG 113.  On arrival to the ED, the patient was hemiplegic on the left, with dense left sided sensory loss, left facial droop, dysarthria, left visual field cut and anosognosia. He was fully conversant and able to understand all questions and commands. He is not on a blood thinner. He has no prior history of stroke, ICH, intracranial aneurysm, brain tumor or MI. No recent major surgeries. NIHSS: 18. He was administered tPA.  HOSPITAL COURSE Mr. RACHEL SAMPLES is a 69 y.o. male with history of HTN, HLD, prostate cancer presenting with L sided weakness, L hemisensory loss, L facial droop, L field cut, dysarthria and anosognosia. Received tPA 01/24/2020 at 2024. Taken to IR for R MCA occlusion.   Stroke:   R MAC infarct due to R MCA occlusion s/p tPA and IR with TICI2b recanalization - embolic secondary to  unknown source  CT head subacute R lateral temporal lobe and temporoparietal area infarcts.     CTA head & neck R M2 superior division occlusion. B ICA bifurcation atherosclerosis.   CT perfusion pseudonormalization R temporal lobe. R frontoparietal jxn infarct 46cc, R MCA 72cc.  Cerebral angio TICI2b revascularization R MCA  MRI - Patchy and scattered acute right MCA territory infarct. Petechial blood in the right temporal lobe.  2D Echo - EF 60 - 65%. No cardiac source of emboli identified.   LE venous Doppler - negative for DVT bilaterally  TEE neg PFO or other SOE   loop placement  2/22 Lovena Le)    LDL 54 - no statin needed given goal LDL  HgbA1c 5.0  UDS pending  Lovenox for VTE prophylaxis  No antithrombotic prior to admission, now on ASA 325 mg and Plavix 75 daily. Continue DAPT for 3 months and then ASA alone.  Therapy recommendations:  CIR  Disposition:  CIR   Hypertensive urgency Hypotension in setting of sedation  BP 180/120 on arrival  Home meds:  Lisinopril-HCTZ 20-25 daily  Was on pressor, now off  BP stable  Resume lisinopril 20  Long-term BP goal normotensive  Dysphagia, resolved  Secondary to stroke  Dysarthria improved  Now on thin liquid diet   Other Stroke Risk Factors  Advanced age  Former Cigarette smoker, quit 20 yrs ago  ETOH use, advised to drink no more than 2 drink(s) a day. On thiamine and folate and MVI.   Other Active Problems  Hx Prostate cancer  Hypokalemia 3.4-3.8-3.7->3.1->supplement->3.7  Hypocalcemia 8.1 - supplement - 8.0  Leukocytosis WBC 7.4->13.3->9.9->7.4 (UA pending) - resolved  Urinary retention requiring I&O cath  DISCHARGE EXAM Blood pressure (!) 135/57, pulse 70, temperature 98.5 F (36.9 C), temperature source Oral, resp. rate 16, height _0  (1.753 m), weight 82.6 kg, SpO2 98 %. General - Well nourished, well developed, in no apparent distress.  Ophthalmologic - fundi not visualized  due to noncooperation.  Cardiovascular - Regular rhythm and rate.  Mental Status -  Level of arousal and orientation to time, place, and person were intact. Language including expression, naming, repetition, comprehension was assessed and found intact. Fund of Knowledge was assessed and was intact.  Cranial Nerves II - XII - II - Visual field intact OU. III, IV, VI - Extraocular movements intact. V - Facial sensation intact bilaterally. VII - mild left facial droop. VIII - Hearing & vestibular intact bilaterally. X - Palate elevates symmetrically. XI - Chin turning & shoulder shrug intact bilaterally. XII - Tongue protrusion intact.  Motor Strength - The patient's strength was normal in RUE and RLE, however, LUE 3/5 proximal but 0/5 distal and LLE 4+/5 proximal and distal.  Bulk was normal and fasciculations were absent.   Motor Tone - Muscle tone was assessed at the neck and appendages and was normal.  Reflexes - The patient's reflexes were symmetrical in all extremities except LUE decreased DTR and tone and he had no pathological reflexes.  Sensory - Light touch, temperature/pinprick were assessed and were decreased on the LUE and LLE    Coordination - The patient had normal movements in the right hand with no ataxia or dysmetria.  Tremor was absent.  Gait and Station - deferred.  Discharge Diet  Heart healthy thin liquids  DISCHARGE PLAN  Disposition:  Transfer to [place] for ongoing PT, OT and ST  aspirin 325 mg daily and clopidogrel 75 mg daily for secondary stroke prevention for 3 months then aspirin alone.  Recommend ongoing stroke risk factor control by Primary Care Physician at time of discharge from inpatient rehabilitation.  Follow-up PCP Isaac Bliss, Rayford Halsted, MD in 2 weeks following discharge from rehab.  Follow-up in [facility] in 4 weeks following discharge from rehab, office to schedule an  appointment.   35 minutes were spent preparing discharge.  Burnetta Sabin, MSN, APRN, ANVP-BC, AGPCNP-BC Advanced Practice Stroke Nurse [place] for Schedule & Pager information 01/28/2020 3:00 PM   ATTENDING NOTE: I reviewed above note and agree with the assessment and  plan.   No acute event overnight.  Patient had TEE for embolic work-up.  TEE showed unremarkable, no PFO.  Loop recorder placed to rule out A. fib as outpatient.  PT/OT recommend inpatient rehab.  Patient will be discharged to [facility] for further rehab.  Rosalin Hawking, MD PhD Stroke Neurology 01/28/2020 6:40 PM

## 2020-01-28 NOTE — H&P (Signed)
Physical Medicine and Rehabilitation Admission H&P    Chief Complaint  Patient presents with  . Code Stroke  : HPI: Ruben Chavez is a 69 y.o. right-handed male with history of hyperlipidemia, hypertension, prostate cancer radioactive seed implant, remote tobacco abuse.  Per chart review lives with spouse independent prior to admission.  Two-level home half bath on the main level and one-step to entry.  Wife can assist as needed.  Presented 01/24/2020 with acute onset of left-sided weakness facial droop and dysarthria.  Cranial CT scan showed subacute infarction right lateral temporal lobe.  No hemorrhage or mass-effect.  CT angiogram of head and neck embolic occlusion of the superior division M2 branch on the right.  Patient did receive TPA.  Underwent revascularization procedure per interventional radiology.  Echocardiogram with ejection fraction of 65% without emboli.  Venous Doppler studies negative.  Neurology consulted maintain on aspirin and Plavix for CVA prophylaxis.  Subcutaneous Lovenox for DVT prophylaxis.  TEE showed ejection fraction 65% negative bubble study without thrombus.  Loop recorder placed 01/28/2020.  Patient did have occasional bouts of orthostatic hypotension blood pressure in the 60s on 01/26/2020 with conservative care and monitored when out of bed.  Therapy evaluations completed and patient was admitted for a comprehensive rehab program.  Review of Systems  Constitutional: Negative for chills and fever.  HENT: Negative for hearing loss.   Eyes: Negative for blurred vision and double vision.  Respiratory: Negative for cough and shortness of breath.   Cardiovascular: Negative for chest pain, palpitations and leg swelling.  Gastrointestinal: Positive for constipation. Negative for heartburn, nausea and vomiting.  Genitourinary: Negative for dysuria, flank pain and hematuria.  Musculoskeletal: Positive for joint pain and myalgias.  Skin: Negative for rash.    Neurological: Positive for speech change and weakness.  All other systems reviewed and are negative.  Past Medical History:  Diagnosis Date  . Elevated PSA   . Hyperlipidemia    pt says not anymore  . Hypertension   . Prostate cancer ([place]) 2013   Past Surgical History:  Procedure Laterality Date  . bilateral inguinal hernia  2008  . CYST REMOVAL TRUNK  2016   sebaceous cyst  . CYSTOSCOPY  04/22/2016   Procedure: CYSTOSCOPY;  Surgeon: [name], MD;  Location: [facility];  Service: Urology;;  . IR PERCUTANEOUS ART THROMBECTOMY/INFUSION INTRACRANIAL INC DIAG ANGIO  01/25/2020  . KNEE ARTHROSCOPY  2007   right  . PROSTATE BIOPSY  2008  . PROSTATE BIOPSY  2013  . PROSTATE BIOPSY  01/28/2016  . RADIOACTIVE SEED IMPLANT N/A 04/22/2016   Procedure: RADIOACTIVE SEED IMPLANT/BRACHYTHERAPY IMPLANT;  Surgeon: [name], MD;  Location: [facility];  Service: Urology;  Laterality: N/A;  . RADIOLOGY WITH ANESTHESIA N/A 01/24/2020   Procedure: IR WITH ANESTHESIA;  Surgeon: [name], MD;  Location: [place];  Service: Radiology;  Laterality: N/A;  . TONSILLECTOMY    . TOTAL HIP ARTHROPLASTY  2001   left  . TOTAL HIP ARTHROPLASTY Right 01/18/2018   Procedure: RIGHT TOTAL HIP ARTHROPLASTY ANTERIOR APPROACH;  Surgeon: [name], MD;  Location: [facility];  Service: Orthopedics;  Laterality: Right;   Family History  Problem Relation Age of Onset  . Pancreatic cancer Mother   . Heart disease Father   . Colon cancer Neg Hx    Social History:  reports that he quit smoking about 20 years ago. His smoking use included cigarettes. He has a 32.00 pack-year smoking history. He  has never used smokeless tobacco. He reports current alcohol use of about 14.0 standard drinks of alcohol per week. He reports that he does not use drugs. Allergies:  Allergies  Allergen Reactions  . Sulfa Antibiotics Swelling and Other (See Comments)    Swelling in ankles    . Advil [Ibuprofen] Other (See Comments)    "Dots on chest" (Petechiae)  . Aleve [Naproxen] Other (See Comments)    "Dots on chest" (Petechiae)  . Betadine [Povidone Iodine] Itching and Rash  . Chloroxylenol (Antiseptic) Itching, Rash and Other (See Comments)    PCMX surgical sterilizing scrub  . Poison Ivy Extract Rash  . Povidone-Iodine Itching and Rash    BETADINE   Medications Prior to Admission  Medication Sig Dispense Refill  . cephALEXin (KEFLEX) 500 MG capsule Take 2,000 mg by mouth See admin instructions. Take 2,000 mg by mouth one hour prior to dental appointment    . HYDROcodone-acetaminophen (NORCO/VICODIN) 5-325 MG tablet Take 1 tablet by mouth every 6 (six) hours as needed for moderate pain. 30 tablet 0  . lisinopril-hydrochlorothiazide (ZESTORETIC) 20-25 MG tablet Take 1 tablet by mouth daily. 90 tablet 1  . methocarbamol (ROBAXIN) 500 MG tablet Take 1 tablet (500 mg total) by mouth every 8 (eight) hours as needed for muscle spasms. 30 tablet 0    Drug Regimen Review Drug regimen was reviewed and remains appropriate with no significant issues identified  Home: Home Living Family/patient expects to be discharged to:: Private residence Living Arrangements: Spouse/significant other Available Help at Discharge: Family, Available 24 hours/day Type of Home: House Home Access: Stairs to enter [employer] of Steps: 1 Home Layout: Two level, 1/2 bath on main level Alternate Level Stairs-Number of Steps: 14 Alternate Level Stairs-Rails: Right, Left Bathroom Shower/Tub: Multimedia programmer: Standard Home Equipment: Crutches Additional Comments: wife is home with him and she is retired, however, she is scheduled for THA on 02/06/20. Good family support from children though too.   Lives With: Spouse   Functional History: Prior Function Level of Independence: Independent Comments: Recently retired in July. ADLs and IADLs.  Functional Status:   Mobility: Bed Mobility Overal bed mobility: Needs Assistance Bed Mobility: Supine to Sit Supine to sit: Min assist General bed mobility comments: Pt able to bring BLEs towards EOB. Min A for elevating trunk Transfers Overall transfer level: Needs assistance Equipment used: Rolling walker (2 wheeled) Transfers: Sit to/from Stand Sit to Stand: Min assist General transfer comment: Min A for safety and balance once in standing Ambulation/Gait Ambulation/Gait assistance: Min assist, +2 safety/equipment Gait Distance (Feet): 20 Feet Assistive device: Rolling walker (2 wheeled) Gait Pattern/deviations: Step-to pattern General Gait Details: pt tapping L foot down on floor several times before putting wt on it or occasionally moving it fwd then bkwd. Easily overstimulated and confused by task.  Gait velocity: decreased Gait velocity interpretation: <1.31 ft/sec, indicative of household ambulator    ADL: ADL Overall ADL's : Needs assistance/impaired Eating/Feeding: Minimal assistance, Sitting Eating/Feeding Details (indicate cue type and reason): bilateral tasks Grooming: Moderate assistance, Sitting Grooming Details (indicate cue type and reason): Mod A for bilateral tasks Upper Body Bathing: Moderate assistance, Sitting Lower Body Bathing: Moderate assistance, Sit to/from stand Upper Body Dressing : Moderate assistance, Sitting Lower Body Dressing: Maximal assistance, Sit to/from stand Lower Body Dressing Details (indicate cue type and reason): Max A for donning socks Toilet Transfer: Minimal assistance, +2 for safety/equipment, Ambulation, RW(simulated to recliner) Toilet Transfer Details (indicate cue type and reason): Min A  for balance nad RW management.  Functional mobility during ADLs: Minimal assistance, +2 for safety/equipment, Rolling walker General ADL Comments: Pt presenting with decreased cognition, balance, strength, functional use of LUE, and  safety.  Cognition: Cognition Overall Cognitive Status: Impaired/Different from baseline Arousal/Alertness: Awake/alert Orientation Level: Oriented X4 Attention: Focused, Sustained Focused Attention: Appears intact Sustained Attention: Impaired Sustained Attention Impairment: Verbal complex Memory: Impaired Memory Impairment: Storage deficit, Retrieval deficit, Decreased recall of new information(Immediate: 5/5; delayed: 3/5) Awareness: Impaired Awareness Impairment: Intellectual impairment Problem Solving: (WNL for functional problem solving) Executive Function: Reasoning, Sequencing Reasoning: Impaired Reasoning Impairment: Verbal complex Sequencing: Appears intact Safety/Judgment: Appears intact Cognition Arousal/Alertness: Awake/alert Behavior During Therapy: Flat affect Overall Cognitive Status: Impaired/Different from baseline Area of Impairment: Attention, Memory, Following commands, Safety/judgement, Awareness, Problem solving Current Attention Level: Focused, Sustained Memory: Decreased short-term memory, Decreased recall of precautions Following Commands: Follows one step commands inconsistently Safety/Judgement: Decreased awareness of safety, Decreased awareness of deficits Awareness: Intellectual Problem Solving: Slow processing, Decreased initiation, Difficulty sequencing, Requires verbal cues, Requires tactile cues General Comments: pt highly distractable, especially when wife present and multiple people speaking. Pt benefiting from calm, direct cues.  Physical Exam: Blood pressure 128/73, pulse (!) 59, temperature 98.2 F (36.8 C), temperature source Oral, resp. rate 14, height 5\' 9"  (1.753 m), weight 81.6 kg, SpO2 99 %. Physical Exam  Constitutional: He appears well-developed.  HENT:  Head: Normocephalic.  Eyes: Pupils are equal, round, and reactive to light.  Cardiovascular: Normal rate.  Respiratory: Effort normal. No respiratory distress. He has no  wheezes.  GI: Soft. He exhibits no distension.  Musculoskeletal:        General: Normal range of motion.     Cervical back: Normal range of motion.  Neurological:  Pt is alert and oriented. Fair insight ad awareness.  Left central 7. Speech is dysarthric. RUE 5/5. LUE 2-3/5 prox to trace distally. BLE 4+ to 5/6. No focal sensory findings.      Skin: Skin is warm.  Psychiatric:  Cooperative. flat    Results for orders placed or performed during the hospital encounter of 01/24/20 (from the past 48 hour(s))  CBC     Status: Abnormal   Collection Time: 01/26/20 10:45 AM  Result Value Ref Range   WBC 9.9 4.0 - 10.5 K/uL   RBC 3.49 (L) 4.22 - 5.81 MIL/uL   Hemoglobin 11.5 (L) 13.0 - 17.0 g/dL   HCT 34.3 (L) 39.0 - 52.0 %   MCV 98.3 80.0 - 100.0 fL   MCH 33.0 26.0 - 34.0 pg   MCHC 33.5 30.0 - 36.0 g/dL   RDW 14.6 11.5 - 15.5 %   Platelets 167 150 - 400 K/uL   nRBC 0.0 0.0 - 0.2 %    Comment: Performed at [facility], [place] [address], [place], [place] [id number]  Basic metabolic panel     Status: Abnormal   Collection Time: 01/26/20 10:45 AM  Result Value Ref Range   Sodium 139 135 - 145 mmol/L   Potassium 4.4 3.5 - 5.1 mmol/L   Chloride 109 98 - 111 mmol/L   CO2 21 (L) 22 - 32 mmol/L   Glucose, Bld 116 (H) 70 - 99 mg/dL   BUN 12 8 - 23 mg/dL   Creatinine, Ser 0.88 0.61 - 1.24 mg/dL   Calcium 8.4 (L) 8.9 - 10.3 mg/dL   GFR calc non Af Amer >60 >60 mL/min   GFR calc Af Amer >60 >60 mL/min   Anion  gap 9 5 - 15    Comment: Performed at [facility], [place] [address], [place], [place] [address]  CBC     Status: Abnormal   Collection Time: 01/27/20  3:39 AM  Result Value Ref Range   WBC 7.4 4.0 - 10.5 K/uL   RBC 3.11 (L) 4.22 - 5.81 MIL/uL   Hemoglobin 10.0 (L) 13.0 - 17.0 g/dL   HCT 30.3 (L) 39.0 - 52.0 %   MCV 97.4 80.0 - 100.0 fL   MCH 32.2 26.0 - 34.0 pg   MCHC 33.0 30.0 - 36.0 g/dL   RDW 14.4 11.5 - 15.5 %   Platelets 149 (L) 150 - 400 K/uL   nRBC 0.0 0.0 -  0.2 %    Comment: Performed at [facility], [place] [address] [place], [place] [id number]  Basic metabolic panel     Status: Abnormal   Collection Time: 01/27/20  3:39 AM  Result Value Ref Range   Sodium 138 135 - 145 mmol/L   Potassium 3.1 (L) 3.5 - 5.1 mmol/L    Comment: DELTA CHECK NOTED   Chloride 104 98 - 111 mmol/L   CO2 23 22 - 32 mmol/L   Glucose, Bld 103 (H) 70 - 99 mg/dL   BUN 10 8 - 23 mg/dL   Creatinine, Ser 0.80 0.61 - 1.24 mg/dL   Calcium 8.1 (L) 8.9 - 10.3 mg/dL   GFR calc non Af Amer >60 >60 mL/min   GFR calc Af Amer >60 >60 mL/min   Anion gap 11 5 - 15    Comment: Performed at [facility], [place] [address] [place], [place] [address]  CBC     Status: Abnormal   Collection Time: 01/28/20  4:00 AM  Result Value Ref Range   WBC 6.1 4.0 - 10.5 K/uL   RBC 3.06 (L) 4.22 - 5.81 MIL/uL   Hemoglobin 9.9 (L) 13.0 - 17.0 g/dL   HCT 29.4 (L) 39.0 - 52.0 %   MCV 96.1 80.0 - 100.0 fL   MCH 32.4 26.0 - 34.0 pg   MCHC 33.7 30.0 - 36.0 g/dL   RDW 13.7 11.5 - 15.5 %   Platelets 169 150 - 400 K/uL   nRBC 0.0 0.0 - 0.2 %    Comment: Performed at [facility], [place] [address] [place], [place] [id number]  Basic metabolic panel     Status: Abnormal   Collection Time: 01/28/20  4:00 AM  Result Value Ref Range   Sodium 138 135 - 145 mmol/L   Potassium 3.7 3.5 - 5.1 mmol/L   Chloride 108 98 - 111 mmol/L   CO2 24 22 - 32 mmol/L   Glucose, Bld 111 (H) 70 - 99 mg/dL   BUN 11 8 - 23 mg/dL   Creatinine, Ser 0.90 0.61 - 1.24 mg/dL   Calcium 8.0 (L) 8.9 - 10.3 mg/dL   GFR calc non Af Amer >60 >60 mL/min   GFR calc Af Amer >60 >60 mL/min   Anion gap 6 5 - 15    Comment: Performed at [facility] [address], [place], [place] [address]  Magnesium     Status: None   Collection Time: 01/28/20  4:00 AM  Result Value Ref Range   Magnesium 1.8 1.7 - 2.4 mg/dL    Comment: Performed at [facility] [address] [place],  [address]  Phosphorus      Status: Abnormal  Collection Time: 01/28/20  4:00 AM  Result Value Ref Range   Phosphorus 2.4 (L) 2.5 - 4.6 mg/dL    Comment: Performed at [facility] [address], [place], [place] [address]   VAS [place] LOWER EXTREMITY VENOUS (DVT)  Result Date: 01/26/2020  Lower Venous DVTStudy Indications: Stroke.  Comparison Study: No prior study on file for comparison Performing Technologist: Sharion Dove RVS  Examination Guidelines: A complete evaluation includes B-mode imaging, spectral Doppler, color Doppler, and power Doppler as needed of all accessible portions of each vessel. Bilateral testing is considered an integral part of a complete examination. Limited examinations for reoccurring indications may be performed as noted. The reflux portion of the exam is performed with the patient in reverse Trendelenburg.  +---------+---------------+---------+-----------+----------+--------------+ RIGHT    CompressibilityPhasicitySpontaneityPropertiesThrombus Aging +---------+---------------+---------+-----------+----------+--------------+ CFV      Full           Yes      Yes                                 +---------+---------------+---------+-----------+----------+--------------+ SFJ      Full                                                        +---------+---------------+---------+-----------+----------+--------------+ FV Prox  Full                                                        +---------+---------------+---------+-----------+----------+--------------+ FV Mid   Full                                                        +---------+---------------+---------+-----------+----------+--------------+ FV DistalFull                                                        +---------+---------------+---------+-----------+----------+--------------+ PFV      Full                                                         +---------+---------------+---------+-----------+----------+--------------+ POP      Full           Yes      Yes                                 +---------+---------------+---------+-----------+----------+--------------+ PTV      Full                                                        +---------+---------------+---------+-----------+----------+--------------+  PERO     Full                                                        +---------+---------------+---------+-----------+----------+--------------+   +---------+---------------+---------+-----------+----------+--------------+ LEFT     CompressibilityPhasicitySpontaneityPropertiesThrombus Aging +---------+---------------+---------+-----------+----------+--------------+ CFV      Full           Yes      Yes                                 +---------+---------------+---------+-----------+----------+--------------+ SFJ      Full                                                        +---------+---------------+---------+-----------+----------+--------------+ FV Prox  Full                                                        +---------+---------------+---------+-----------+----------+--------------+ FV Mid   Full                                                        +---------+---------------+---------+-----------+----------+--------------+ FV DistalFull                                                        +---------+---------------+---------+-----------+----------+--------------+ PFV      Full                                                        +---------+---------------+---------+-----------+----------+--------------+ POP      Full           Yes      Yes                                 +---------+---------------+---------+-----------+----------+--------------+ PTV      Full                                                         +---------+---------------+---------+-----------+----------+--------------+ PERO     Full                                                        +---------+---------------+---------+-----------+----------+--------------+  Summary: BILATERAL: - No evidence of deep vein thrombosis seen in the lower extremities, bilaterally.   *See table(s) above for measurements and observations. Electronically signed by Deitra Mayo MD on 01/26/2020 at 4:57:01 PM.    Final        Medical Problem List and Plan: 1.  Right side weakness with dysarthria and facial droop secondary to right MCA infarction.  Status post loop recorder placement  -patient may  shower  -ELOS/Goals: 10-14 days, mod I 2.  Antithrombotics: -DVT/anticoagulation: Lovenox  -antiplatelet therapy: Aspirin 325 mg daily and Plavix 75 mg daily x3 months then aspirin alone 3. Pain Management: Tylenol as needed 4. Mood: Provide emotional support  -antipsychotic agents: N/A 5. Neuropsych: This patient is capable of making decisions on his own behalf. 6. Skin/Wound Care: Routine skin checks 7. Fluids/Electrolytes/Nutrition: Routine in and outs with follow-up chemistries 8.  Hypertension.  Lisinopril 20 mg daily.  Monitor with increased mobility 9.  Hyperlipidemia.  Lipitor 10.Prostate cancer with history of radioactive seed implant.  Follow-up outpatient 11.  Remote tobacco abuse.  Continue to provide counseling      Cathlyn Parsons, PA-C 01/28/2020

## 2020-01-28 NOTE — Progress Notes (Deleted)
Ruben Ribas, MD  Physician  Physical Medicine and Rehabilitation  Consult Note  Signed  Date of Service:  01/28/2020  4:46 AM          Signed      Expand AllCollapse All   Show:Clear all [x] Manual[x] Template[] Copied  Added by: [x] Angiulli, Lavon Paganini, PA-C[x] Raulkar, Clide Deutscher, MD  [] Hover for details          Physical Medicine and Rehabilitation Consult Reason for Consult: Left side weakness Referring Physician: Dr.Xu     HPI: Ruben Chavez is a 69 y.o. right-handed male with history of hyperlipidemia, hypertension, prostate cancer with radioactive seed implant, remote tobacco abuse.  Per chart review patient lives with spouse independent prior to admission.  Two-level home half bath on main level one-step to entry.  Wife can assist as needed.  Presented 01/24/2020 with acute onset of left-sided weakness facial droop and dysarthria.  Cranial CT scan showed subacute infarction in the right lateral temporal lobe.  No hemorrhage or mass-effect.  CT angiogram of head and neck embolic occlusion of the superior division M2 branch on the right.  Patient did receive TPA.  Patient underwent revascularization procedure per interventional radiology.  Echocardiogram with ejection fraction of 65% without emboli.  Venous Doppler studies negative.  Neurology consulted currently on aspirin and Plavix for CVA prophylaxis.  Subcutaneous Lovenox for DVT prophylaxis.  Awaiting plan for possible loop recorder.  Patient with bouts of orthostatic hypotension BP down to the 60s 01/26/2020 with conservative care.  Therapy evaluations completed with recommendations of physical medicine rehab consult.     Review of Systems  Constitutional: Negative for chills and fever.  HENT: Negative for hearing loss.   Eyes: Negative for blurred vision and double vision.  Respiratory: Negative for cough and shortness of breath.   Cardiovascular: Negative for chest pain, palpitations and leg swelling.    Gastrointestinal: Positive for constipation. Negative for heartburn and nausea.  Genitourinary: Negative for dysuria, flank pain and hematuria.  Musculoskeletal: Positive for joint pain and myalgias.  Skin: Negative for rash.  Neurological: Positive for speech change and weakness.  All other systems reviewed and are negative.       Past Medical History:  Diagnosis Date  . Elevated PSA    . Hyperlipidemia      pt says not anymore  . Hypertension    . Prostate cancer ([place]) 2013         Past Surgical History:  Procedure Laterality Date  . bilateral inguinal hernia   2008  . CYST REMOVAL TRUNK   2016    sebaceous cyst  . CYSTOSCOPY   04/22/2016    Procedure: CYSTOSCOPY;  Surgeon: [name], MD;  Location: [facility];  Service: Urology;;  . KNEE ARTHROSCOPY   2007    right  . PROSTATE BIOPSY   2008  . PROSTATE BIOPSY   2013  . PROSTATE BIOPSY   01/28/2016  . RADIOACTIVE SEED IMPLANT N/A 04/22/2016    Procedure: RADIOACTIVE SEED IMPLANT/BRACHYTHERAPY IMPLANT;  Surgeon: [name], MD;  Location: [facility];  Service: Urology;  Laterality: N/A;  . RADIOLOGY WITH ANESTHESIA N/A 01/24/2020    Procedure: IR WITH ANESTHESIA;  Surgeon: [name], MD;  Location: [place];  Service: Radiology;  Laterality: N/A;  . TONSILLECTOMY      . TOTAL HIP ARTHROPLASTY   2001    left  . TOTAL HIP ARTHROPLASTY Right 01/18/2018    Procedure: RIGHT TOTAL HIP ARTHROPLASTY ANTERIOR APPROACH;  Surgeon: [name], MD;  Location: [facility];  Service: Orthopedics;  Laterality: Right;         Family History  Problem Relation Age of Onset  . Pancreatic cancer Mother    . Heart disease Father    . Colon cancer Neg Hx      Social History:  reports that he quit smoking about 20 years ago. His smoking use included cigarettes. He has a 32.00 pack-year smoking history. He has never used smokeless tobacco. He reports current alcohol use of about 14.0 standard  drinks of alcohol per week. He reports that he does not use drugs. Allergies:       Allergies  Allergen Reactions  . Sulfa Antibiotics Swelling and Other (See Comments)      Swelling in ankles    . Advil [Ibuprofen] Other (See Comments)      "Dots on chest" (Petechiae)  . Aleve [Naproxen] Other (See Comments)      "Dots on chest" (Petechiae)  . Betadine [Povidone Iodine] Itching and Rash  . Chloroxylenol (Antiseptic) Itching, Rash and Other (See Comments)      PCMX surgical sterilizing scrub  . Poison Ivy Extract Rash  . Povidone-Iodine Itching and Rash      BETADINE          Medications Prior to Admission  Medication Sig Dispense Refill  . cephALEXin (KEFLEX) 500 MG capsule Take 2,000 mg by mouth See admin instructions. Take 2,000 mg by mouth one hour prior to dental appointment      . HYDROcodone-acetaminophen (NORCO/VICODIN) 5-325 MG tablet Take 1 tablet by mouth every 6 (six) hours as needed for moderate pain. 30 tablet 0  . lisinopril-hydrochlorothiazide (ZESTORETIC) 20-25 MG tablet Take 1 tablet by mouth daily. 90 tablet 1  . methocarbamol (ROBAXIN) 500 MG tablet Take 1 tablet (500 mg total) by mouth every 8 (eight) hours as needed for muscle spasms. 30 tablet 0      Home: Home Living Family/patient expects to be discharged to:: Private residence Living Arrangements: Spouse/significant other Available Help at Discharge: Family, Available 24 hours/day Type of Home: House Home Access: Stairs to enter [employer] of Steps: 1 Home Layout: Two level, 1/2 bath on main level Alternate Level Stairs-Number of Steps: 14 Alternate Level Stairs-Rails: Right, Left Bathroom Shower/Tub: Multimedia programmer: Standard Home Equipment: Crutches Additional Comments: wife is home with him and she is retired, however, she is scheduled for THA on 02/06/20. Good family support from children though too.   Lives With: Spouse  Functional History: Prior Function Level of  Independence: Independent Comments: Recently retired in July. ADLs and IADLs. Functional Status:  Mobility: Bed Mobility Overal bed mobility: Needs Assistance Bed Mobility: Supine to Sit Supine to sit: Min assist General bed mobility comments: Pt able to bring BLEs towards EOB. Min A for elevating trunk Transfers Overall transfer level: Needs assistance Equipment used: Rolling walker (2 wheeled) Transfers: Sit to/from Stand Sit to Stand: Min assist General transfer comment: Min A for safety and balance once in standing Ambulation/Gait Ambulation/Gait assistance: Min assist, +2 safety/equipment Gait Distance (Feet): 20 Feet Assistive device: Rolling walker (2 wheeled) Gait Pattern/deviations: Step-to pattern General Gait Details: pt tapping L foot down on floor several times before putting wt on it or occasionally moving it fwd then bkwd. Easily overstimulated and confused by task.  Gait velocity: decreased Gait velocity interpretation: <1.31 ft/sec, indicative of household ambulator   ADL: ADL Overall ADL's : Needs assistance/impaired Eating/Feeding: Minimal assistance, Sitting Eating/Feeding  Details (indicate cue type and reason): bilateral tasks Grooming: Moderate assistance, Sitting Grooming Details (indicate cue type and reason): Mod A for bilateral tasks Upper Body Bathing: Moderate assistance, Sitting Lower Body Bathing: Moderate assistance, Sit to/from stand Upper Body Dressing : Moderate assistance, Sitting Lower Body Dressing: Maximal assistance, Sit to/from stand Lower Body Dressing Details (indicate cue type and reason): Max A for donning socks Toilet Transfer: Minimal assistance, +2 for safety/equipment, Ambulation, RW(simulated to recliner) Toilet Transfer Details (indicate cue type and reason): Min A for balance nad RW management.  Functional mobility during ADLs: Minimal assistance, +2 for safety/equipment, Rolling walker General ADL Comments: Pt presenting with  decreased cognition, balance, strength, functional use of LUE, and safety.   Cognition: Cognition Overall Cognitive Status: Impaired/Different from baseline Arousal/Alertness: Awake/alert Orientation Level: Oriented X4 Attention: Focused, Sustained Focused Attention: Appears intact Sustained Attention: Impaired Sustained Attention Impairment: Verbal complex Memory: Impaired Memory Impairment: Storage deficit, Retrieval deficit, Decreased recall of new information(Immediate: 5/5; delayed: 3/5) Awareness: Impaired Awareness Impairment: Intellectual impairment Problem Solving: (WNL for functional problem solving) Executive Function: Reasoning, Sequencing Reasoning: Impaired Reasoning Impairment: Verbal complex Sequencing: Appears intact Safety/Judgment: Appears intact Cognition Arousal/Alertness: Awake/alert Behavior During Therapy: Flat affect Overall Cognitive Status: Impaired/Different from baseline Area of Impairment: Attention, Memory, Following commands, Safety/judgement, Awareness, Problem solving Current Attention Level: Focused, Sustained Memory: Decreased short-term memory, Decreased recall of precautions Following Commands: Follows one step commands inconsistently Safety/Judgement: Decreased awareness of safety, Decreased awareness of deficits Awareness: Intellectual Problem Solving: Slow processing, Decreased initiation, Difficulty sequencing, Requires verbal cues, Requires tactile cues General Comments: pt highly distractable, especially when wife present and multiple people speaking. Pt benefiting from calm, direct cues.   Blood pressure 129/60, pulse 76, temperature 99 F (37.2 C), temperature source Oral, resp. rate 16, height 5\' 9"  (1.753 m), weight 81.6 kg, SpO2 99 %.   Physical Exam  General: Alert and oriented x 3, No apparent distress HEENT: Head is normocephalic, atraumatic, PERRLA, EOMI, sclera anicteric, oral mucosa pink and moist, dentition intact, ext ear  canals clear,  Neck: Supple without JVD or lymphadenopathy Heart: Reg rate and rhythm. No murmurs rubs or gallops Chest: CTA bilaterally without wheezes, rales, or rhonchi; no distress Abdomen: Soft, non-tender, non-distended, bowel sounds positive. Extremities: No clubbing, cyanosis, or edema. Pulses are 2+ Skin: Clean and intact without signs of breakdown Neurological: Patient is AOx3. Speech is dysarthric but intelligible.  Follows simple commands. +facial droop. 5/5 strength in RUE and bilateral lower extremities. LUE: 3/5 SA, 2/5 EE and EF, 0/5 WF and hand grip with swelling in right hand.  Musculoskeletal: Full ROM, No pain with AROM or PROM in the neck, trunk, or extremities. Posture appropriate Psych: Pt's affect is appropriate. Pt is cooperative. Expresses frustration with medical condition and situational depression.    Lab Results Last 24 Hours       Results for orders placed or performed during the hospital encounter of 01/24/20 (from the past 24 hour(s))  CBC     Status: Abnormal    Collection Time: 01/28/20  4:00 AM  Result Value Ref Range    WBC 6.1 4.0 - 10.5 K/uL    RBC 3.06 (L) 4.22 - 5.81 MIL/uL    Hemoglobin 9.9 (L) 13.0 - 17.0 g/dL    HCT 29.4 (L) 39.0 - 52.0 %    MCV 96.1 80.0 - 100.0 fL    MCH 32.4 26.0 - 34.0 pg    MCHC 33.7 30.0 - 36.0 g/dL    RDW 13.7  11.5 - 15.5 %    Platelets 169 150 - 400 K/uL    nRBC 0.0 0.0 - 0.2 %  Basic metabolic panel     Status: Abnormal    Collection Time: 01/28/20  4:00 AM  Result Value Ref Range    Sodium 138 135 - 145 mmol/L    Potassium 3.7 3.5 - 5.1 mmol/L    Chloride 108 98 - 111 mmol/L    CO2 24 22 - 32 mmol/L    Glucose, Bld 111 (H) 70 - 99 mg/dL    BUN 11 8 - 23 mg/dL    Creatinine, Ser 0.90 0.61 - 1.24 mg/dL    Calcium 8.0 (L) 8.9 - 10.3 mg/dL    GFR calc non Af Amer >60 >60 mL/min    GFR calc Af Amer >60 >60 mL/min    Anion gap 6 5 - 15  Magnesium     Status: None    Collection Time: 01/28/20  4:00 AM  Result  Value Ref Range    Magnesium 1.8 1.7 - 2.4 mg/dL  Phosphorus     Status: Abnormal    Collection Time: 01/28/20  4:00 AM  Result Value Ref Range    Phosphorus 2.4 (L) 2.5 - 4.6 mg/dL       Imaging Results (Last 48 hours)  VAS [place] LOWER EXTREMITY VENOUS (DVT)   Result Date: 01/26/2020  Lower Venous DVTStudy Indications: Stroke.  Comparison Study: No prior study on file for comparison Performing Technologist: Sharion Dove RVS  Examination Guidelines: A complete evaluation includes B-mode imaging, spectral Doppler, color Doppler, and power Doppler as needed of all accessible portions of each vessel. Bilateral testing is considered an integral part of a complete examination. Limited examinations for reoccurring indications may be performed as noted. The reflux portion of the exam is performed with the patient in reverse Trendelenburg.  +---------+---------------+---------+-----------+----------+--------------+ RIGHT    CompressibilityPhasicitySpontaneityPropertiesThrombus Aging +---------+---------------+---------+-----------+----------+--------------+ CFV      Full           Yes      Yes                                 +---------+---------------+---------+-----------+----------+--------------+ SFJ      Full                                                        +---------+---------------+---------+-----------+----------+--------------+ FV Prox  Full                                                        +---------+---------------+---------+-----------+----------+--------------+ FV Mid   Full                                                        +---------+---------------+---------+-----------+----------+--------------+ FV DistalFull                                                        +---------+---------------+---------+-----------+----------+--------------+  PFV      Full                                                         +---------+---------------+---------+-----------+----------+--------------+ POP      Full           Yes      Yes                                 +---------+---------------+---------+-----------+----------+--------------+ PTV      Full                                                        +---------+---------------+---------+-----------+----------+--------------+ PERO     Full                                                        +---------+---------------+---------+-----------+----------+--------------+   +---------+---------------+---------+-----------+----------+--------------+ LEFT     CompressibilityPhasicitySpontaneityPropertiesThrombus Aging +---------+---------------+---------+-----------+----------+--------------+ CFV      Full           Yes      Yes                                 +---------+---------------+---------+-----------+----------+--------------+ SFJ      Full                                                        +---------+---------------+---------+-----------+----------+--------------+ FV Prox  Full                                                        +---------+---------------+---------+-----------+----------+--------------+ FV Mid   Full                                                        +---------+---------------+---------+-----------+----------+--------------+ FV DistalFull                                                        +---------+---------------+---------+-----------+----------+--------------+ PFV      Full                                                        +---------+---------------+---------+-----------+----------+--------------+  POP      Full           Yes      Yes                                 +---------+---------------+---------+-----------+----------+--------------+ PTV      Full                                                         +---------+---------------+---------+-----------+----------+--------------+ PERO     Full                                                        +---------+---------------+---------+-----------+----------+--------------+     Summary: BILATERAL: - No evidence of deep vein thrombosis seen in the lower extremities, bilaterally.   *See table(s) above for measurements and observations. Electronically signed by Deitra Mayo MD on 01/26/2020 at 4:57:01 PM.    Final          Assessment/Plan: Diagnosis: Impaired mobility and ADLs 2/2 right MCA stroke 1. Does the need for close, 24 hr/day medical supervision in concert with the patient's rehab needs make it unreasonable for this patient to be served in a less intensive setting? Yes 2. Co-Morbidities requiring supervision/potential complications: HLD, HTN, prostate cancer, remote tobacco abuse 3. Due to bowel management, safety, skin/wound care, disease management, medication administration, pain management and patient education, does the patient require 24 hr/day rehab nursing? Yes 4. Does the patient require coordinated care of a physician, rehab nurse, therapy disciplines of PT, OT, SLP to address physical and functional deficits in the context of the above medical diagnosis(es)? Yes Addressing deficits in the following areas: balance, endurance, locomotion, strength, transferring, bowel/bladder control, bathing, dressing, feeding, grooming, toileting, cognition, speech and psychosocial support 5. Can the patient actively participate in an intensive therapy program of at least 3 hrs of therapy per day at least 5 days per week? Yes 6. The potential for patient to make measurable gains while on inpatient rehab is excellent 7. Anticipated functional outcomes upon discharge from inpatient rehab are modified independent  with PT, modified independent with OT, modified independent with SLP. 8. Estimated rehab length of stay to reach the above functional  goals is: 10-14 days 9. Anticipated discharge destination: Home 10. Overall Rehab/Functional Prognosis: excellent   RECOMMENDATIONS: This patient's condition is appropriate for continued rehabilitative care in the following setting: CIR Patient has agreed to participate in recommended program. Yes Note that insurance prior authorization may be required for reimbursement for recommended care.   Comment: Mr Walls would be an excellent CIR candidate. He would benefit from neuropysch follow-up for situational depression. Discussed rationale for loop recorder today. Patient is anxious about missing his COVID-19 vaccination on Thursday. He has excellent home support from his wife, who is a former Tree surgeon.    Lavon Paganini Angiulli, PA-C 01/28/2020    I have personally performed a face to face diagnostic evaluation, including, but not limited to relevant history and physical exam findings, of this patient and developed relevant assessment and plan.  Additionally, I have reviewed and concur with  the physician assistant's documentation above.   Leeroy Cha, MD        Revision History                     Routing History

## 2020-01-28 NOTE — H&P (Signed)
Physical Medicine and Rehabilitation Admission H&P        Chief Complaint  Patient presents with  . Code Stroke  : HPI: Ruben Chavez is a 69 y.o. right-handed male with history of hyperlipidemia, hypertension, prostate cancer radioactive seed implant, remote tobacco abuse.  Per chart review lives with spouse independent prior to admission.  Two-level home half bath on the main level and one-step to entry.  Wife can assist as needed.  Presented 01/24/2020 with acute onset of left-sided weakness facial droop and dysarthria.  Cranial CT scan showed subacute infarction right lateral temporal lobe.  No hemorrhage or mass-effect.  CT angiogram of head and neck embolic occlusion of the superior division M2 branch on the right.  Patient did receive TPA.  Underwent revascularization procedure per interventional radiology.  Echocardiogram with ejection fraction of 65% without emboli.  Venous Doppler studies negative.  Neurology consulted maintain on aspirin and Plavix for CVA prophylaxis.  Subcutaneous Lovenox for DVT prophylaxis.  TEE showed ejection fraction 65% negative bubble study without thrombus.  Loop recorder placed 01/28/2020.  Patient did have occasional bouts of orthostatic hypotension blood pressure in the 60s on 01/26/2020 with conservative care and monitored when out of bed.  Therapy evaluations completed and patient was admitted for a comprehensive rehab program.   Review of Systems  Constitutional: Negative for chills and fever.  HENT: Negative for hearing loss.   Eyes: Negative for blurred vision and double vision.  Respiratory: Negative for cough and shortness of breath.   Cardiovascular: Negative for chest pain, palpitations and leg swelling.  Gastrointestinal: Positive for constipation. Negative for heartburn, nausea and vomiting.  Genitourinary: Negative for dysuria, flank pain and hematuria.  Musculoskeletal: Positive for joint pain and myalgias.  Skin: Negative for rash.    Neurological: Positive for speech change and weakness.  All other systems reviewed and are negative.       Past Medical History:  Diagnosis Date  . Elevated PSA    . Hyperlipidemia      pt says not anymore  . Hypertension    . Prostate cancer ([place]) 2013         Past Surgical History:  Procedure Laterality Date  . bilateral inguinal hernia   2008  . CYST REMOVAL TRUNK   2016    sebaceous cyst  . CYSTOSCOPY   04/22/2016    Procedure: CYSTOSCOPY;  Surgeon: [name], MD;  Location: [facility];  Service: Urology;;  . IR PERCUTANEOUS ART THROMBECTOMY/INFUSION INTRACRANIAL INC DIAG ANGIO   01/25/2020  . KNEE ARTHROSCOPY   2007    right  . PROSTATE BIOPSY   2008  . PROSTATE BIOPSY   2013  . PROSTATE BIOPSY   01/28/2016  . RADIOACTIVE SEED IMPLANT N/A 04/22/2016    Procedure: RADIOACTIVE SEED IMPLANT/BRACHYTHERAPY IMPLANT;  Surgeon: [name], MD;  Location[facility];  Service: Urology;  Laterality: N/A;  . RADIOLOGY WITH ANESTHESIA N/A 01/24/2020    Procedure: IR WITH ANESTHESIA;  Surgeon: [name], MD;  Location: [facility];  Service: Radiology;  Laterality: N/A;  . TONSILLECTOMY      . TOTAL HIP ARTHROPLASTY   2001    left  . TOTAL HIP ARTHROPLASTY Right 01/18/2018    Procedure: RIGHT TOTAL HIP ARTHROPLASTY ANTERIOR APPROACH;  Surgeon: [name], MD;  Location: [facility];  Service: Orthopedics;  Laterality: Right;         Family History  Problem Relation Age of Onset  .  Pancreatic cancer Mother    . Heart disease Father    . Colon cancer Neg Hx      Social History:  reports that he quit smoking about 20 years ago. His smoking use included cigarettes. He has a 32.00 pack-year smoking history. He has never used smokeless tobacco. He reports current alcohol use of about 14.0 standard drinks of alcohol per week. He reports that he does not use drugs. Allergies:       Allergies  Allergen Reactions  . Sulfa Antibiotics Swelling  and Other (See Comments)      Swelling in ankles    . Advil [Ibuprofen] Other (See Comments)      "Dots on chest" (Petechiae)  . Aleve [Naproxen] Other (See Comments)      "Dots on chest" (Petechiae)  . Betadine [Povidone Iodine] Itching and Rash  . Chloroxylenol (Antiseptic) Itching, Rash and Other (See Comments)      PCMX surgical sterilizing scrub  . Poison Ivy Extract Rash  . Povidone-Iodine Itching and Rash      BETADINE          Medications Prior to Admission  Medication Sig Dispense Refill  . cephALEXin (KEFLEX) 500 MG capsule Take 2,000 mg by mouth See admin instructions. Take 2,000 mg by mouth one hour prior to dental appointment      . HYDROcodone-acetaminophen (NORCO/VICODIN) 5-325 MG tablet Take 1 tablet by mouth every 6 (six) hours as needed for moderate pain. 30 tablet 0  . lisinopril-hydrochlorothiazide (ZESTORETIC) 20-25 MG tablet Take 1 tablet by mouth daily. 90 tablet 1  . methocarbamol (ROBAXIN) 500 MG tablet Take 1 tablet (500 mg total) by mouth every 8 (eight) hours as needed for muscle spasms. 30 tablet 0      Drug Regimen Review Drug regimen was reviewed and remains appropriate with no significant issues identified   Home: Home Living Family/patient expects to be discharged to:: Private residence Living Arrangements: Spouse/significant other Available Help at Discharge: Family, Available 24 hours/day Type of Home: House Home Access: Stairs to enter [employer] of Steps: 1 Home Layout: Two level, 1/2 bath on main level Alternate Level Stairs-Number of Steps: 14 Alternate Level Stairs-Rails: Right, Left Bathroom Shower/Tub: Multimedia programmer: Standard Home Equipment: Crutches Additional Comments: wife is home with him and she is retired, however, she is scheduled for THA on 02/06/20. Good family support from children though too.   Lives With: Spouse   Functional History: Prior Function Level of Independence:  Independent Comments: Recently retired in July. ADLs and IADLs.   Functional Status:  Mobility: Bed Mobility Overal bed mobility: Needs Assistance Bed Mobility: Supine to Sit Supine to sit: Min assist General bed mobility comments: Pt able to bring BLEs towards EOB. Min A for elevating trunk Transfers Overall transfer level: Needs assistance Equipment used: Rolling walker (2 wheeled) Transfers: Sit to/from Stand Sit to Stand: Min assist General transfer comment: Min A for safety and balance once in standing Ambulation/Gait Ambulation/Gait assistance: Min assist, +2 safety/equipment Gait Distance (Feet): 20 Feet Assistive device: Rolling walker (2 wheeled) Gait Pattern/deviations: Step-to pattern General Gait Details: pt tapping L foot down on floor several times before putting wt on it or occasionally moving it fwd then bkwd. Easily overstimulated and confused by task.  Gait velocity: decreased Gait velocity interpretation: <1.31 ft/sec, indicative of household ambulator   ADL: ADL Overall ADL's : Needs assistance/impaired Eating/Feeding: Minimal assistance, Sitting Eating/Feeding Details (indicate cue type and reason): bilateral tasks Grooming: Moderate assistance, Sitting  Grooming Details (indicate cue type and reason): Mod A for bilateral tasks Upper Body Bathing: Moderate assistance, Sitting Lower Body Bathing: Moderate assistance, Sit to/from stand Upper Body Dressing : Moderate assistance, Sitting Lower Body Dressing: Maximal assistance, Sit to/from stand Lower Body Dressing Details (indicate cue type and reason): Max A for donning socks Toilet Transfer: Minimal assistance, +2 for safety/equipment, Ambulation, RW(simulated to recliner) Toilet Transfer Details (indicate cue type and reason): Min A for balance nad RW management.  Functional mobility during ADLs: Minimal assistance, +2 for safety/equipment, Rolling walker General ADL Comments: Pt presenting with decreased  cognition, balance, strength, functional use of LUE, and safety.   Cognition: Cognition Overall Cognitive Status: Impaired/Different from baseline Arousal/Alertness: Awake/alert Orientation Level: Oriented X4 Attention: Focused, Sustained Focused Attention: Appears intact Sustained Attention: Impaired Sustained Attention Impairment: Verbal complex Memory: Impaired Memory Impairment: Storage deficit, Retrieval deficit, Decreased recall of new information(Immediate: 5/5; delayed: 3/5) Awareness: Impaired Awareness Impairment: Intellectual impairment Problem Solving: (WNL for functional problem solving) Executive Function: Reasoning, Sequencing Reasoning: Impaired Reasoning Impairment: Verbal complex Sequencing: Appears intact Safety/Judgment: Appears intact Cognition Arousal/Alertness: Awake/alert Behavior During Therapy: Flat affect Overall Cognitive Status: Impaired/Different from baseline Area of Impairment: Attention, Memory, Following commands, Safety/judgement, Awareness, Problem solving Current Attention Level: Focused, Sustained Memory: Decreased short-term memory, Decreased recall of precautions Following Commands: Follows one step commands inconsistently Safety/Judgement: Decreased awareness of safety, Decreased awareness of deficits Awareness: Intellectual Problem Solving: Slow processing, Decreased initiation, Difficulty sequencing, Requires verbal cues, Requires tactile cues General Comments: pt highly distractable, especially when wife present and multiple people speaking. Pt benefiting from calm, direct cues.   Physical Exam: Blood pressure 128/73, pulse (!) 59, temperature 98.2 F (36.8 C), temperature source Oral, resp. rate 14, height 5\' 9"  (1.753 m), weight 81.6 kg, SpO2 99 %. Physical Exam  Constitutional: He appears well-developed.  HENT:  Head: Normocephalic.  Eyes: Pupils are equal, round, and reactive to light.  Cardiovascular: Normal rate.   Respiratory: Effort normal. No respiratory distress. He has no wheezes. Loop recorder chest wall GI: Soft. He exhibits no distension.  Musculoskeletal:        General: Normal range of motion.     Cervical back: Normal range of motion.  Neurological:  Pt is alert and oriented. Fair insight and awareness.  Left central 7. Speech is dysarthric. RUE 5/5. LUE 2-3/5 prox to trace distally. BLE 4+ to 5/6. No focal sensory findings.      Skin: Skin is warm.  Psychiatric:  Cooperative. flat      Lab Results Last 48 Hours        Results for orders placed or performed during the hospital encounter of 01/24/20 (from the past 48 hour(s))  CBC     Status: Abnormal    Collection Time: 01/26/20 10:45 AM  Result Value Ref Range    WBC 9.9 4.0 - 10.5 K/uL    RBC 3.49 (L) 4.22 - 5.81 MIL/uL    Hemoglobin 11.5 (L) 13.0 - 17.0 g/dL    HCT 34.3 (L) 39.0 - 52.0 %    MCV 98.3 80.0 - 100.0 fL    MCH 33.0 26.0 - 34.0 pg    MCHC 33.5 30.0 - 36.0 g/dL    RDW 14.6 11.5 - 15.5 %    Platelets 167 150 - 400 K/uL    nRBC 0.0 0.0 - 0.2 %      Comment: Performed at [facility], [place] [address] [place], [place] [id number]  Basic metabolic panel  Status: Abnormal    Collection Time: 01/26/20 10:45 AM  Result Value Ref Range    Sodium 139 135 - 145 mmol/L    Potassium 4.4 3.5 - 5.1 mmol/L    Chloride 109 98 - 111 mmol/L    CO2 21 (L) 22 - 32 mmol/L    Glucose, Bld 116 (H) 70 - 99 mg/dL    BUN 12 8 - 23 mg/dL    Creatinine, Ser 0.88 0.61 - 1.24 mg/dL    Calcium 8.4 (L) 8.9 - 10.3 mg/dL    GFR calc non Af Amer >60 >60 mL/min    GFR calc Af Amer >60 >60 mL/min    Anion gap 9 5 - 15      Comment: Performed at [facility] [address], [place], [place] [address]  CBC     Status: Abnormal    Collection Time: 01/27/20  3:39 AM  Result Value Ref Range    WBC 7.4 4.0 - 10.5 K/uL    RBC 3.11 (L) 4.22 - 5.81 MIL/uL    Hemoglobin 10.0 (L) 13.0 - 17.0 g/dL    HCT 30.3 (L) 39.0 - 52.0 %    MCV  97.4 80.0 - 100.0 fL    MCH 32.2 26.0 - 34.0 pg    MCHC 33.0 30.0 - 36.0 g/dL    RDW 14.4 11.5 - 15.5 %    Platelets 149 (L) 150 - 400 K/uL    nRBC 0.0 0.0 - 0.2 %      Comment: Performed at [facility], [place] [address] [place], [place] [id number]  Basic metabolic panel     Status: Abnormal    Collection Time: 01/27/20  3:39 AM  Result Value Ref Range    Sodium 138 135 - 145 mmol/L    Potassium 3.1 (L) 3.5 - 5.1 mmol/L      Comment: DELTA CHECK NOTED    Chloride 104 98 - 111 mmol/L    CO2 23 22 - 32 mmol/L    Glucose, Bld 103 (H) 70 - 99 mg/dL    BUN 10 8 - 23 mg/dL    Creatinine, Ser 0.80 0.61 - 1.24 mg/dL    Calcium 8.1 (L) 8.9 - 10.3 mg/dL    GFR calc non Af Amer >60 >60 mL/min    GFR calc Af Amer >60 >60 mL/min    Anion gap 11 5 - 15      Comment: Performed at [facility], [place] [address] [place], [place] [address]  CBC     Status: Abnormal    Collection Time: 01/28/20  4:00 AM  Result Value Ref Range    WBC 6.1 4.0 - 10.5 K/uL    RBC 3.06 (L) 4.22 - 5.81 MIL/uL    Hemoglobin 9.9 (L) 13.0 - 17.0 g/dL    HCT 29.4 (L) 39.0 - 52.0 %    MCV 96.1 80.0 - 100.0 fL    MCH 32.4 26.0 - 34.0 pg    MCHC 33.7 30.0 - 36.0 g/dL    RDW 13.7 11.5 - 15.5 %    Platelets 169 150 - 400 K/uL    nRBC 0.0 0.0 - 0.2 %      Comment: Performed at [facility], [place] [address] [place], [place] [id number]  Basic metabolic panel     Status: Abnormal    Collection Time: 01/28/20  4:00 AM  Result Value Ref Range    Sodium 138 135 -  145 mmol/L    Potassium 3.7 3.5 - 5.1 mmol/L    Chloride 108 98 - 111 mmol/L    CO2 24 22 - 32 mmol/L    Glucose, Bld 111 (H) 70 - 99 mg/dL    BUN 11 8 - 23 mg/dL    Creatinine, Ser 0.90 0.61 - 1.24 mg/dL    Calcium 8.0 (L) 8.9 - 10.3 mg/dL    GFR calc non Af Amer >60 >60 mL/min    GFR calc Af Amer >60 >60 mL/min    Anion gap 6 5 - 15      Comment: Performed at [facility] [address], [place], [place] [address]  Magnesium     Status:  None    Collection Time: 01/28/20  4:00 AM  Result Value Ref Range    Magnesium 1.8 1.7 - 2.4 mg/dL      Comment: Performed at [facility] [address] [place], [place] [address]  Phosphorus     Status: Abnormal    Collection Time: 01/28/20  4:00 AM  Result Value Ref Range    Phosphorus 2.4 (L) 2.5 - 4.6 mg/dL      Comment: Performed at [facility] [address], [place], [place] [address]       Imaging Results (Last 48 hours)  VAS [place] LOWER EXTREMITY VENOUS (DVT)   Result Date: 01/26/2020  Lower Venous DVTStudy Indications: Stroke.  Comparison Study: No prior study on file for comparison Performing Technologist: Sharion Dove RVS  Examination Guidelines: A complete evaluation includes B-mode imaging, spectral Doppler, color Doppler, and power Doppler as needed of all accessible portions of each vessel. Bilateral testing is considered an integral part of a complete examination. Limited examinations for reoccurring indications may be performed as noted. The reflux portion of the exam is performed with the patient in reverse Trendelenburg.  +---------+---------------+---------+-----------+----------+--------------+ RIGHT    CompressibilityPhasicitySpontaneityPropertiesThrombus Aging +---------+---------------+---------+-----------+----------+--------------+ CFV      Full           Yes      Yes                                 +---------+---------------+---------+-----------+----------+--------------+ SFJ      Full                                                        +---------+---------------+---------+-----------+----------+--------------+ FV Prox  Full                                                        +---------+---------------+---------+-----------+----------+--------------+ FV Mid   Full                                                        +---------+---------------+---------+-----------+----------+--------------+ FV DistalFull                                                         +---------+---------------+---------+-----------+----------+--------------+  PFV      Full                                                        +---------+---------------+---------+-----------+----------+--------------+ POP      Full           Yes      Yes                                 +---------+---------------+---------+-----------+----------+--------------+ PTV      Full                                                        +---------+---------------+---------+-----------+----------+--------------+ PERO     Full                                                        +---------+---------------+---------+-----------+----------+--------------+   +---------+---------------+---------+-----------+----------+--------------+ LEFT     CompressibilityPhasicitySpontaneityPropertiesThrombus Aging +---------+---------------+---------+-----------+----------+--------------+ CFV      Full           Yes      Yes                                 +---------+---------------+---------+-----------+----------+--------------+ SFJ      Full                                                        +---------+---------------+---------+-----------+----------+--------------+ FV Prox  Full                                                        +---------+---------------+---------+-----------+----------+--------------+ FV Mid   Full                                                        +---------+---------------+---------+-----------+----------+--------------+ FV DistalFull                                                        +---------+---------------+---------+-----------+----------+--------------+ PFV      Full                                                        +---------+---------------+---------+-----------+----------+--------------+  POP      Full           Yes      Yes                                  +---------+---------------+---------+-----------+----------+--------------+ PTV      Full                                                        +---------+---------------+---------+-----------+----------+--------------+ PERO     Full                                                        +---------+---------------+---------+-----------+----------+--------------+     Summary: BILATERAL: - No evidence of deep vein thrombosis seen in the lower extremities, bilaterally.   *See table(s) above for measurements and observations. Electronically signed by Deitra Mayo MD on 01/26/2020 at 4:57:01 PM.    Final              Medical Problem List and Plan: 1.  Right side weakness with dysarthria and facial droop secondary to right MCA infarction.  Status post loop recorder placement             -patient may  shower             -ELOS/Goals: 10-14 days, mod I 2.  Antithrombotics: -DVT/anticoagulation: Lovenox             -antiplatelet therapy: Aspirin 325 mg daily and Plavix 75 mg daily x3 months then aspirin alone 3. Pain Management: Tylenol as needed 4. Mood: Provide emotional support             -antipsychotic agents: N/A 5. Neuropsych: This patient is capable of making decisions on his own behalf. 6. Skin/Wound Care: Routine skin checks 7. Fluids/Electrolytes/Nutrition: Routine in and outs with follow-up chemistries 8.  Hypertension.  Lisinopril 20 mg daily.  Monitor with increased mobility 9.  Hyperlipidemia.  Lipitor 10.Prostate cancer with history of radioactive seed implant.  Follow-up outpatient 11.  Remote tobacco abuse.  Continue to provide counseling         Cathlyn Parsons, PA-C 01/28/2020    The patient's status has not changed from the original H&P.  Any changes in documentation from the acute care chart have been noted above.  Meredith Staggers, MD, [facility]

## 2020-01-28 NOTE — Progress Notes (Signed)
STROKE TEAM PROGRESS NOTE   INTERVAL HISTORY Patient lying in bed. We discussed TEE testing. He would like to proceed. Plans for loop following and rehab is working on Biochemist, clinical for transfer there.   Vitals:   01/28/20 0329 01/28/20 0803 01/28/20 1147 01/28/20 1215  BP: 129/60 128/73 132/81 132/73  Pulse: 76 (!) 59 63 61  Resp: _0 (!) 22  Temp: 99 F (37.2 C) 98.2 F (36.8 C) 97.8 F (36.6 C) 97.8 F (36.6 C)  TempSrc: Oral Oral Oral Oral  SpO2: 99% 99% 100% 99%  Weight:    82.6 kg  Height:        CBC:  Recent Labs  Lab 01/24/20 2006 01/24/20 2032 01/25/20 0150 01/25/20 0502 01/27/20 0339 01/28/20 0400  WBC 7.4   < > 13.3*   < > 7.4 6.1  NEUTROABS 4.7  --  11.2*  --   --   --   HGB 14.6   < > 13.9   < > 10.0* 9.9*  HCT 43.2   < > 40.3   < > 30.3* 29.4*  MCV 94.7   < > 93.9   < > 97.4 96.1  PLT 264   < > 269   < > 149* 169   < > = values in this interval not displayed.    Basic Metabolic Panel:  Recent Labs  Lab 01/27/20 0339 01/28/20 0400  NA 138 138  K 3.1* 3.7  CL 104 108  CO2 23 24  GLUCOSE 103* 111*  BUN 10 11  CREATININE 0.80 0.90  CALCIUM 8.1* 8.0*  MG  --  1.8  PHOS  --  2.4*    IMAGING past 48 hours No results found.   TEE  01/28/2020 No SOE No LAA thrombus EF 60-65% Negative bubble study Trivial MR Mild TR No AS No effusion Mild aortic debris  Cerebral Angio 2/18/20121 S/P RT common carotid arteriogram followed by revascularization of occluded inf division of Rt MCA with x2 passes with 26m x 40 mm solitaireX retriever and x 1 pass with 378mx 32 mm Trevoprovue retriever and penumbra aspiration achieving a TICI 2b revascularization.  PHYSICAL EXAM  Temp:  [97.8 F (36.6 C)-99.5 F (37.5 C)] 97.8 F (36.6 C) (02/22 1215) Pulse Rate:  [59-80] 61 (02/22 1215) Resp:  [14-22] 22 (02/22 1215) BP: (126-166)/(53-83) 132/73 (02/22 1215) SpO2:  [99 %-100 %] 99 % (02/22 1215) Weight:  [82.6 kg] 82.6 kg (02/22  1215)  General - Well nourished, well developed, in no apparent distress.  Ophthalmologic - fundi not visualized due to noncooperation.  Cardiovascular - Regular rhythm and rate.  Mental Status -  Level of arousal and orientation to time, place, and person were intact. Language including expression, naming, repetition, comprehension was assessed and found intact. Fund of Knowledge was assessed and was intact.  Cranial Nerves II - XII - II - Visual field intact OU. III, IV, VI - Extraocular movements intact. V - Facial sensation intact bilaterally. VII - mild left facial droop. dysarthric VIII - Hearing & vestibular intact bilaterally. X - Palate elevates symmetrically. XI - Chin turning & shoulder shrug intact bilaterally. XII - Tongue protrusion intact.  Motor Strength - The patient's strength was normal in RUE and RLE, however, LUE 3+/5 proximal but 0/5 distal and LLE 4+/5 proximal and distal.  Bulk was normal and fasciculations were absent.   Motor Tone - Muscle tone was assessed at the neck and appendages and was normal.  Reflexes - The patient's reflexes were symmetrical in all extremities except LUE decreased DTR and tone and he had no pathological reflexes.  Sensory - Light touch, temperature/pinprick were assessed and were decreased on the LUE and LLE    Coordination - The patient had normal movements in the right hand with no ataxia or dysmetria.  Tremor was absent.  Gait and Station - deferred.   ASSESSMENT/PLAN [name] is a 69 y.o. male with history of HTN, HLD, prostate cancer presenting with L sided weakness, L hemisensory loss, L facial droop, L field cut, dysarthria and anosognosia. Received tPA 01/24/2020 at 2024. Taken to IR for R MCA occlusion.   Stroke:   [name]  CT head subacute R lateral temporal lobe and temporoparietal area infarcts.      CTA head & neck R M2 superior division occlusion. B ICA bifurcation atherosclerosis.   CT perfusion pseudonormalization R temporal lobe. R frontoparietal jxn infarct 46cc, R MCA 72cc.  Cerebral angio TICI2b revascularization R MCA  MRI - Patchy and scattered acute right MCA territory infarct. Petechial blood in the right temporal lobe.  2D Echo - EF 60 - 65%. No cardiac source of emboli identified.   LE venous Doppler - negative for DVT bilaterally  TEE neg PFO or other SOE   loop placement 2/22 Lovena Le)    LDL 54 - no statin needed given goal LDL  HgbA1c 5.0  UDS pending  Lovenox for VTE prophylaxis  No antithrombotic prior to admission, now on ASA 325 mg and Plavix 75 daily. Continue DAPT for 3 months and then ASA alone.  Therapy recommendations:  CIR  Disposition:  pending - awaiting insurance approval  Hypertensive urgency Hypotension in setting of sedation  BP 180/120 on arrival  Home meds:  Lisinopril-HCTZ 20-25 daily . Was on pressor, now off . BP stable . Resume lisinopril 20 . Long-term BP goal normotensive  Dysphagia   . Secondary to stroke . Dysarthria improved . Now on diet . Speech on board   Other Stroke Risk Factors  Advanced age  Former Cigarette smoker, quit 20 yrs ago  ETOH use, advised to drink no more than 2 drink(s) a day. On thiamine and folate and MVI.   Other Active Problems  Hx Prostate cancer  Hypokalemia 3.4-3.8-3.7->3.1->supplement->3.7  Hypocalcemia 8.1 - supplement - 8.0  Leukocytosis WBC 7.4->13.3->9.9->7.4 (UA pending)  Urinary retention requiring I&O cath  Hospital day # [place], MSN, APRN, ANVP-BC, AGPCNP-BC Advanced Practice Stroke Nurse [place] for Schedule & Pager information 01/28/2020 1:54 PM  To contact Stroke Continuity provider, please refer to [email or web address] After hours, contact General Neurology

## 2020-01-28 NOTE — Progress Notes (Signed)
Ruben Ribas, MD  Physician  Physical Medicine and Rehabilitation  Consult Note  Signed  Date of Service:  01/28/2020  4:46 AM      Related encounter: ED to Hosp-Admission (Current) from 01/24/2020 in [facility]      Signed      Expand AllCollapse All   Show:Clear all [x] Manual[x] Template[] Copied  Added by: [x] Angiulli, Lavon Paganini, PA-C[x] Raulkar, Clide Deutscher, MD  [] Hover for details          Physical Medicine and Rehabilitation Consult Reason for Consult: Left side weakness Referring Physician: Dr.Xu     HPI: Ruben Chavez is a 69 y.o. right-handed male with history of hyperlipidemia, hypertension, prostate cancer with radioactive seed implant, remote tobacco abuse.  Per chart review patient lives with spouse independent prior to admission.  Two-level home half bath on main level one-step to entry.  Wife can assist as needed.  Presented 01/24/2020 with acute onset of left-sided weakness facial droop and dysarthria.  Cranial CT scan showed subacute infarction in the right lateral temporal lobe.  No hemorrhage or mass-effect.  CT angiogram of head and neck embolic occlusion of the superior division M2 branch on the right.  Patient did receive TPA.  Patient underwent revascularization procedure per interventional radiology.  Echocardiogram with ejection fraction of 65% without emboli.  Venous Doppler studies negative.  Neurology consulted currently on aspirin and Plavix for CVA prophylaxis.  Subcutaneous Lovenox for DVT prophylaxis.  Awaiting plan for possible loop recorder.  Patient with bouts of orthostatic hypotension BP down to the 60s 01/26/2020 with conservative care.  Therapy evaluations completed with recommendations of physical medicine rehab consult.     Review of Systems  Constitutional: Negative for chills and fever.  HENT: Negative for hearing loss.   Eyes: Negative for blurred vision and double vision.  Respiratory: Negative for cough and  shortness of breath.   Cardiovascular: Negative for chest pain, palpitations and leg swelling.  Gastrointestinal: Positive for constipation. Negative for heartburn and nausea.  Genitourinary: Negative for dysuria, flank pain and hematuria.  Musculoskeletal: Positive for joint pain and myalgias.  Skin: Negative for rash.  Neurological: Positive for speech change and weakness.  All other systems reviewed and are negative.       Past Medical History:  Diagnosis Date  . Elevated PSA    . Hyperlipidemia      pt says not anymore  . Hypertension    . Prostate cancer ([place]) 2013         Past Surgical History:  Procedure Laterality Date  . bilateral inguinal hernia   2008  . CYST REMOVAL TRUNK   2016    sebaceous cyst  . CYSTOSCOPY   04/22/2016    Procedure: CYSTOSCOPY;  Surgeon: [name], MD;  Location: [facility];  Service: Urology;;  . KNEE ARTHROSCOPY   2007    right  . PROSTATE BIOPSY   2008  . PROSTATE BIOPSY   2013  . PROSTATE BIOPSY   01/28/2016  . RADIOACTIVE SEED IMPLANT N/A 04/22/2016    Procedure: RADIOACTIVE SEED IMPLANT/BRACHYTHERAPY IMPLANT;  Surgeon: [name], MD;  Location: [facility];  Service: Urology;  Laterality: N/A;  . RADIOLOGY WITH ANESTHESIA N/A 01/24/2020    Procedure: IR WITH ANESTHESIA;  Surgeon: [name], MD;  Location: [facility];  Service: Radiology;  Laterality: N/A;  . TONSILLECTOMY      . TOTAL HIP ARTHROPLASTY   2001    left  . TOTAL  HIP ARTHROPLASTY Right 01/18/2018    Procedure: RIGHT TOTAL HIP ARTHROPLASTY ANTERIOR APPROACH;  Surgeon: [name], MD;  Location: [facility];  Service: Orthopedics;  Laterality: Right;         Family History  Problem Relation Age of Onset  . Pancreatic cancer Mother    . Heart disease Father    . Colon cancer Neg Hx      Social History:  reports that he quit smoking about 20 years ago. His smoking use included cigarettes. He has a 32.00 pack-year smoking  history. He has never used smokeless tobacco. He reports current alcohol use of about 14.0 standard drinks of alcohol per week. He reports that he does not use drugs. Allergies:       Allergies  Allergen Reactions  . Sulfa Antibiotics Swelling and Other (See Comments)      Swelling in ankles    . Advil [Ibuprofen] Other (See Comments)      "Dots on chest" (Petechiae)  . Aleve [Naproxen] Other (See Comments)      "Dots on chest" (Petechiae)  . Betadine [Povidone Iodine] Itching and Rash  . Chloroxylenol (Antiseptic) Itching, Rash and Other (See Comments)      PCMX surgical sterilizing scrub  . Poison Ivy Extract Rash  . Povidone-Iodine Itching and Rash      BETADINE          Medications Prior to Admission  Medication Sig Dispense Refill  . cephALEXin (KEFLEX) 500 MG capsule Take 2,000 mg by mouth See admin instructions. Take 2,000 mg by mouth one hour prior to dental appointment      . HYDROcodone-acetaminophen (NORCO/VICODIN) 5-325 MG tablet Take 1 tablet by mouth every 6 (six) hours as needed for moderate pain. 30 tablet 0  . lisinopril-hydrochlorothiazide (ZESTORETIC) 20-25 MG tablet Take 1 tablet by mouth daily. 90 tablet 1  . methocarbamol (ROBAXIN) 500 MG tablet Take 1 tablet (500 mg total) by mouth every 8 (eight) hours as needed for muscle spasms. 30 tablet 0      Home: Home Living Family/patient expects to be discharged to:: Private residence Living Arrangements: Spouse/significant other Available Help at Discharge: Family, Available 24 hours/day Type of Home: House Home Access: Stairs to enter [employer] of Steps: 1 Home Layout: Two level, 1/2 bath on main level Alternate Level Stairs-Number of Steps: 14 Alternate Level Stairs-Rails: Right, Left Bathroom Shower/Tub: Multimedia programmer: Standard Home Equipment: Crutches Additional Comments: wife is home with him and she is retired, however, she is scheduled for THA on 02/06/20. Good family  support from children though too.   Lives With: Spouse  Functional History: Prior Function Level of Independence: Independent Comments: Recently retired in July. ADLs and IADLs. Functional Status:  Mobility: Bed Mobility Overal bed mobility: Needs Assistance Bed Mobility: Supine to Sit Supine to sit: Min assist General bed mobility comments: Pt able to bring BLEs towards EOB. Min A for elevating trunk Transfers Overall transfer level: Needs assistance Equipment used: Rolling walker (2 wheeled) Transfers: Sit to/from Stand Sit to Stand: Min assist General transfer comment: Min A for safety and balance once in standing Ambulation/Gait Ambulation/Gait assistance: Min assist, +2 safety/equipment Gait Distance (Feet): 20 Feet Assistive device: Rolling walker (2 wheeled) Gait Pattern/deviations: Step-to pattern General Gait Details: pt tapping L foot down on floor several times before putting wt on it or occasionally moving it fwd then bkwd. Easily overstimulated and confused by task.  Gait velocity: decreased Gait velocity interpretation: <1.31 ft/sec, indicative of household  ambulator   ADL: ADL Overall ADL's : Needs assistance/impaired Eating/Feeding: Minimal assistance, Sitting Eating/Feeding Details (indicate cue type and reason): bilateral tasks Grooming: Moderate assistance, Sitting Grooming Details (indicate cue type and reason): Mod A for bilateral tasks Upper Body Bathing: Moderate assistance, Sitting Lower Body Bathing: Moderate assistance, Sit to/from stand Upper Body Dressing : Moderate assistance, Sitting Lower Body Dressing: Maximal assistance, Sit to/from stand Lower Body Dressing Details (indicate cue type and reason): Max A for donning socks Toilet Transfer: Minimal assistance, +2 for safety/equipment, Ambulation, RW(simulated to recliner) Toilet Transfer Details (indicate cue type and reason): Min A for balance nad RW management.  Functional mobility during  ADLs: Minimal assistance, +2 for safety/equipment, Rolling walker General ADL Comments: Pt presenting with decreased cognition, balance, strength, functional use of LUE, and safety.   Cognition: Cognition Overall Cognitive Status: Impaired/Different from baseline Arousal/Alertness: Awake/alert Orientation Level: Oriented X4 Attention: Focused, Sustained Focused Attention: Appears intact Sustained Attention: Impaired Sustained Attention Impairment: Verbal complex Memory: Impaired Memory Impairment: Storage deficit, Retrieval deficit, Decreased recall of new information(Immediate: 5/5; delayed: 3/5) Awareness: Impaired Awareness Impairment: Intellectual impairment Problem Solving: (WNL for functional problem solving) Executive Function: Reasoning, Sequencing Reasoning: Impaired Reasoning Impairment: Verbal complex Sequencing: Appears intact Safety/Judgment: Appears intact Cognition Arousal/Alertness: Awake/alert Behavior During Therapy: Flat affect Overall Cognitive Status: Impaired/Different from baseline Area of Impairment: Attention, Memory, Following commands, Safety/judgement, Awareness, Problem solving Current Attention Level: Focused, Sustained Memory: Decreased short-term memory, Decreased recall of precautions Following Commands: Follows one step commands inconsistently Safety/Judgement: Decreased awareness of safety, Decreased awareness of deficits Awareness: Intellectual Problem Solving: Slow processing, Decreased initiation, Difficulty sequencing, Requires verbal cues, Requires tactile cues General Comments: pt highly distractable, especially when wife present and multiple people speaking. Pt benefiting from calm, direct cues.   Blood pressure 129/60, pulse 76, temperature 99 F (37.2 C), temperature source Oral, resp. rate 16, height 5\' 9"  (1.753 m), weight 81.6 kg, SpO2 99 %.   Physical Exam  General: Alert and oriented x 3, No apparent distress HEENT: Head is  normocephalic, atraumatic, PERRLA, EOMI, sclera anicteric, oral mucosa pink and moist, dentition intact, ext ear canals clear,  Neck: Supple without JVD or lymphadenopathy Heart: Reg rate and rhythm. No murmurs rubs or gallops Chest: CTA bilaterally without wheezes, rales, or rhonchi; no distress Abdomen: Soft, non-tender, non-distended, bowel sounds positive. Extremities: No clubbing, cyanosis, or edema. Pulses are 2+ Skin: Clean and intact without signs of breakdown Neurological: Patient is AOx3. Speech is dysarthric but intelligible.  Follows simple commands. +facial droop. 5/5 strength in RUE and bilateral lower extremities. LUE: 3/5 SA, 2/5 EE and EF, 0/5 WF and hand grip with swelling in right hand.  Musculoskeletal: Full ROM, No pain with AROM or PROM in the neck, trunk, or extremities. Posture appropriate Psych: Pt's affect is appropriate. Pt is cooperative. Expresses frustration with medical condition and situational depression.    Lab Results Last 24 Hours       Results for orders placed or performed during the hospital encounter of 01/24/20 (from the past 24 hour(s))  CBC     Status: Abnormal    Collection Time: 01/28/20  4:00 AM  Result Value Ref Range    WBC 6.1 4.0 - 10.5 K/uL    RBC 3.06 (L) 4.22 - 5.81 MIL/uL    Hemoglobin 9.9 (L) 13.0 - 17.0 g/dL    HCT 29.4 (L) 39.0 - 52.0 %    MCV 96.1 80.0 - 100.0 fL    MCH 32.4 26.0 - 34.0  pg    MCHC 33.7 30.0 - 36.0 g/dL    RDW 13.7 11.5 - 15.5 %    Platelets 169 150 - 400 K/uL    nRBC 0.0 0.0 - 0.2 %  Basic metabolic panel     Status: Abnormal    Collection Time: 01/28/20  4:00 AM  Result Value Ref Range    Sodium 138 135 - 145 mmol/L    Potassium 3.7 3.5 - 5.1 mmol/L    Chloride 108 98 - 111 mmol/L    CO2 24 22 - 32 mmol/L    Glucose, Bld 111 (H) 70 - 99 mg/dL    BUN 11 8 - 23 mg/dL    Creatinine, Ser 0.90 0.61 - 1.24 mg/dL    Calcium 8.0 (L) 8.9 - 10.3 mg/dL    GFR calc non Af Amer >60 >60 mL/min    GFR calc Af Amer >60  >60 mL/min    Anion gap 6 5 - 15  Magnesium     Status: None    Collection Time: 01/28/20  4:00 AM  Result Value Ref Range    Magnesium 1.8 1.7 - 2.4 mg/dL  Phosphorus     Status: Abnormal    Collection Time: 01/28/20  4:00 AM  Result Value Ref Range    Phosphorus 2.4 (L) 2.5 - 4.6 mg/dL       Imaging Results (Last 48 hours)  VAS [place] LOWER EXTREMITY VENOUS (DVT)   Result Date: 01/26/2020  Lower Venous DVTStudy Indications: Stroke.  Comparison Study: No prior study on file for comparison Performing Technologist: Sharion Dove RVS  Examination Guidelines: A complete evaluation includes B-mode imaging, spectral Doppler, color Doppler, and power Doppler as needed of all accessible portions of each vessel. Bilateral testing is considered an integral part of a complete examination. Limited examinations for reoccurring indications may be performed as noted. The reflux portion of the exam is performed with the patient in reverse Trendelenburg.  +---------+---------------+---------+-----------+----------+--------------+ RIGHT    CompressibilityPhasicitySpontaneityPropertiesThrombus Aging +---------+---------------+---------+-----------+----------+--------------+ CFV      Full           Yes      Yes                                 +---------+---------------+---------+-----------+----------+--------------+ SFJ      Full                                                        +---------+---------------+---------+-----------+----------+--------------+ FV Prox  Full                                                        +---------+---------------+---------+-----------+----------+--------------+ FV Mid   Full                                                        +---------+---------------+---------+-----------+----------+--------------+ FV DistalFull                                                         +---------+---------------+---------+-----------+----------+--------------+  PFV      Full                                                        +---------+---------------+---------+-----------+----------+--------------+ POP      Full           Yes      Yes                                 +---------+---------------+---------+-----------+----------+--------------+ PTV      Full                                                        +---------+---------------+---------+-----------+----------+--------------+ PERO     Full                                                        +---------+---------------+---------+-----------+----------+--------------+   +---------+---------------+---------+-----------+----------+--------------+ LEFT     CompressibilityPhasicitySpontaneityPropertiesThrombus Aging +---------+---------------+---------+-----------+----------+--------------+ CFV      Full           Yes      Yes                                 +---------+---------------+---------+-----------+----------+--------------+ SFJ      Full                                                        +---------+---------------+---------+-----------+----------+--------------+ FV Prox  Full                                                        +---------+---------------+---------+-----------+----------+--------------+ FV Mid   Full                                                        +---------+---------------+---------+-----------+----------+--------------+ FV DistalFull                                                        +---------+---------------+---------+-----------+----------+--------------+ PFV      Full                                                        +---------+---------------+---------+-----------+----------+--------------+  POP      Full           Yes      Yes                                  +---------+---------------+---------+-----------+----------+--------------+ PTV      Full                                                        +---------+---------------+---------+-----------+----------+--------------+ PERO     Full                                                        +---------+---------------+---------+-----------+----------+--------------+     Summary: BILATERAL: - No evidence of deep vein thrombosis seen in the lower extremities, bilaterally.   *See table(s) above for measurements and observations. Electronically signed by Deitra Mayo MD on 01/26/2020 at 4:57:01 PM.    Final          Assessment/Plan: Diagnosis: Impaired mobility and ADLs 2/2 right MCA stroke 1. Does the need for close, 24 hr/day medical supervision in concert with the patient's rehab needs make it unreasonable for this patient to be served in a less intensive setting? Yes 2. Co-Morbidities requiring supervision/potential complications: HLD, HTN, prostate cancer, remote tobacco abuse 3. Due to bowel management, safety, skin/wound care, disease management, medication administration, pain management and patient education, does the patient require 24 hr/day rehab nursing? Yes 4. Does the patient require coordinated care of a physician, rehab nurse, therapy disciplines of PT, OT, SLP to address physical and functional deficits in the context of the above medical diagnosis(es)? Yes Addressing deficits in the following areas: balance, endurance, locomotion, strength, transferring, bowel/bladder control, bathing, dressing, feeding, grooming, toileting, cognition, speech and psychosocial support 5. Can the patient actively participate in an intensive therapy program of at least 3 hrs of therapy per day at least 5 days per week? Yes 6. The potential for patient to make measurable gains while on inpatient rehab is excellent 7. Anticipated functional outcomes upon discharge from inpatient rehab are  modified independent  with PT, modified independent with OT, modified independent with SLP. 8. Estimated rehab length of stay to reach the above functional goals is: 10-14 days 9. Anticipated discharge destination: Home 10. Overall Rehab/Functional Prognosis: excellent   RECOMMENDATIONS: This patient's condition is appropriate for continued rehabilitative care in the following setting: CIR Patient has agreed to participate in recommended program. Yes Note that insurance prior authorization may be required for reimbursement for recommended care.   Comment: Mr Dubow would be an excellent CIR candidate. He would benefit from neuropysch follow-up for situational depression. Discussed rationale for loop recorder today. Patient is anxious about missing his COVID-19 vaccination on Thursday. He has excellent home support from his wife, who is a former Tree surgeon.    Lavon Paganini Angiulli, PA-C 01/28/2020    I have personally performed a face to face diagnostic evaluation, including, but not limited to relevant history and physical exam findings, of this patient and developed relevant assessment and plan.  Additionally, I have reviewed and concur with  the physician assistant's documentation above.   Leeroy Cha, MD        Revision History                     Routing History

## 2020-01-28 NOTE — Interval H&P Note (Signed)
History and Physical Interval Note:  01/28/2020 1:23 PM  Ruben Chavez  has presented today for surgery, with the diagnosis of stroke.  The various methods of treatment have been discussed with the patient and family. After consideration of risks, benefits and other options for treatment, the patient has consented to  Procedure(s): TRANSESOPHAGEAL ECHOCARDIOGRAM (TEE) (N/A) as a surgical intervention.  The patient's history has been reviewed, patient examined, no change in status, stable for surgery.  I have reviewed the patient's chart and labs.  Questions were answered to the patient's satisfaction.     Jenkins Rouge

## 2020-01-28 NOTE — Transfer of Care (Signed)
Immediate Anesthesia Transfer of Care Note  Patient: Ruben Chavez  Procedure(s) Performed: TRANSESOPHAGEAL ECHOCARDIOGRAM (TEE) (N/A ) BUBBLE STUDY  Patient Location: Endoscopy Unit  Anesthesia Type:MAC  Level of Consciousness: drowsy  Airway & Oxygen Therapy: Patient Spontanous Breathing and Patient connected to nasal cannula oxygen  Post-op Assessment: Report given to RN and Post -op Vital signs reviewed and stable  Post vital signs: Reviewed and stable  Last Vitals:  Vitals Value Taken Time  BP    Temp    Pulse    Resp    SpO2      Last Pain:  Vitals:   01/28/20 1215  TempSrc: Oral  PainSc: 0-No pain         Complications: No apparent anesthesia complications

## 2020-01-28 NOTE — Progress Notes (Signed)
   [place] has been requested to perform a transesophageal echocardiogram on Ruben Chavez for acute stroke.  After careful review of history and examination, the risks and benefits of transesophageal echocardiogram have been explained including risks of esophageal damage, perforation (1:10,000 risk), bleeding, pharyngeal hematoma as well as other potential complications associated with conscious sedation including aspiration, arrhythmia, respiratory failure and death. Alternatives to treatment were discussed, questions were answered. Patient is willing to proceed.   Procedure is scheduled for today at 13:30 with Dr. Jenkins Rouge.  Darreld Mclean, PA-C 01/28/2020 10:17 AM

## 2020-01-28 NOTE — Progress Notes (Signed)
Pt concerned about missing his 2nd covid vaccination scheduled for Thursday, since he says he will be in rehab at that time. Also, pt anxious, unable to sleep most of the night, even after 1 time dose of trazadone at bedtime, and one 5-325 mg tablet of norco for pain in the neck. Pt expressed frustration over being NPO for possible TEE, and stated he wasn't told about the test. Emotional support given to patient.

## 2020-01-29 ENCOUNTER — Inpatient Hospital Stay (HOSPITAL_COMMUNITY): Payer: PPO

## 2020-01-29 ENCOUNTER — Inpatient Hospital Stay (HOSPITAL_COMMUNITY): Payer: PPO | Admitting: Physical Therapy

## 2020-01-29 ENCOUNTER — Inpatient Hospital Stay (HOSPITAL_COMMUNITY): Payer: PPO | Admitting: Speech Pathology

## 2020-01-29 DIAGNOSIS — I63511 Cerebral infarction due to unspecified occlusion or stenosis of right middle cerebral artery: Secondary | ICD-10-CM

## 2020-01-29 LAB — COMPREHENSIVE METABOLIC PANEL
ALT: 26 U/L (ref 0–44)
AST: 26 U/L (ref 15–41)
Albumin: 2.7 g/dL — ABNORMAL LOW (ref 3.5–5.0)
Alkaline Phosphatase: 35 U/L — ABNORMAL LOW (ref 38–126)
Anion gap: 9 (ref 5–15)
BUN: 12 mg/dL (ref 8–23)
CO2: 23 mmol/L (ref 22–32)
Calcium: 8.5 mg/dL — ABNORMAL LOW (ref 8.9–10.3)
Chloride: 107 mmol/L (ref 98–111)
Creatinine, Ser: 0.88 mg/dL (ref 0.61–1.24)
GFR calc Af Amer: >60 mL/min (ref >60)
GFR calc non Af Amer: >60 mL/min (ref >60)
Glucose, Bld: 103 mg/dL — ABNORMAL HIGH (ref 70–99)
Potassium: 3.7 mmol/L (ref 3.5–5.1)
Sodium: 139 mmol/L (ref 135–145)
Total Bilirubin: 0.4 mg/dL (ref 0.3–1.2)
Total Protein: 5.4 g/dL — ABNORMAL LOW (ref 6.5–8.1)

## 2020-01-29 LAB — CBC WITH DIFFERENTIAL/PLATELET
Abs Immature Granulocytes: 0.02 10*3/uL (ref 0.00–0.07)
Basophils Absolute: 0 10*3/uL (ref 0.0–0.1)
Basophils Relative: 0 %
Eosinophils Absolute: 0.3 10*3/uL (ref 0.0–0.5)
Eosinophils Relative: 5 %
HCT: 30.3 % — ABNORMAL LOW (ref 39.0–52.0)
Hemoglobin: 10.2 g/dL — ABNORMAL LOW (ref 13.0–17.0)
Immature Granulocytes: 0 %
Lymphocytes Relative: 14 %
Lymphs Abs: 0.8 10*3/uL (ref 0.7–4.0)
MCH: 32.1 pg (ref 26.0–34.0)
MCHC: 33.7 g/dL (ref 30.0–36.0)
MCV: 95.3 fL (ref 80.0–100.0)
Monocytes Absolute: 0.5 10*3/uL (ref 0.1–1.0)
Monocytes Relative: 9 %
Neutro Abs: 4.2 10*3/uL (ref 1.7–7.7)
Neutrophils Relative %: 72 %
Platelets: 204 10*3/uL (ref 150–400)
RBC: 3.18 MIL/uL — ABNORMAL LOW (ref 4.22–5.81)
RDW: 13.5 % (ref 11.5–15.5)
WBC: 5.8 10*3/uL (ref 4.0–10.5)
nRBC: 0 % (ref 0.0–0.2)

## 2020-01-29 MED ORDER — SENNOSIDES-DOCUSATE SODIUM 8.6-50 MG PO TABS
2.0000 | ORAL_TABLET | Freq: Every day | ORAL | Status: DC
  Administered 2020-01-29 – 2020-02-13 (×16): 2 via ORAL
  Filled 2020-01-29 (×16): qty 2

## 2020-01-29 MED ORDER — TRAMADOL HCL 50 MG PO TABS
50.0000 mg | ORAL_TABLET | Freq: Four times a day (QID) | ORAL | Status: DC | PRN
  Filled 2020-01-29: qty 1

## 2020-01-29 MED ORDER — BISACODYL 10 MG RE SUPP: 10.0000 mg | Freq: Every day | RECTAL | Status: DC | PRN

## 2020-01-29 NOTE — Progress Notes (Signed)
Inpatient Rehabilitation  Patient information reviewed and entered into eRehab system by Yoshi Vicencio M. Cosimo Schertzer, M.A., CCC/SLP, PPS Coordinator.  Information including medical coding, functional ability and quality indicators will be reviewed and updated through discharge.    

## 2020-01-29 NOTE — Patient Care Conference (Signed)
Inpatient RehabilitationTeam Conference and Plan of Care Update Date: 01/29/2020   Time: 11:50 AM   Patient Name: Ruben Chavez      Medical Record Number: [id number]  Date of Birth: September 29, 1951 Sex: Male         Room/Bed: 4M13C/4M13C-01 Payor Info: Payor: [employer] / Plan: [employer] PPO / Product Type: *No Product type* /    Admit Date/Time:  01/28/2020  5:43 PM  Primary Diagnosis:  Right middle cerebral artery stroke [facility])  Patient Active Problem List   Diagnosis Date Noted  . Cognitive and neurobehavioral dysfunction   . Hypertensive urgency 01/28/2020  . Urinary retention 01/28/2020  . Right middle cerebral artery stroke ([place]) 01/28/2020  . Stroke (cerebrum) ([place]) -  [name] 01/25/2020  . Acute respiratory failure with hypoxia ([place])   . Hypotension   . Hypokalemia   . Hypocalcemia   . Encephalopathy acute   . Stroke ([place]) 01/24/2020  . Middle cerebral artery embolism, right 01/24/2020  . Hyperlipidemia 02/21/2019  . Impingement syndrome of right shoulder region 02/21/2018  . OA (osteoarthritis) of hip 01/18/2018  . Prostate cancer ([place]) 03/04/2016  . Essential hypertension 11/27/2014  . History of colonic polyps 09/26/2014  . BACK PAIN, LEFT 12/22/2009  . PROSTATE SPECIFIC ANTIGEN, ELEVATED 12/20/2008  . LATERAL EPICONDYLITIS 07/15/2008  . ACTINIC KERATOSIS, FOREHEAD, LEFT 05/23/2008  . WART, LEFT HAND 07/19/2007  . ERECTILE DYSFUNCTION, MILD 07/19/2007  . HERNIA, BILATERAL INGUINAL W/O OBST/GANGRENE 07/19/2007  . MENISCUS TEAR 07/19/2007  . Bilateral inguinal hernia 07/19/2007    Expected Discharge Date: Expected Discharge Date: (Estimated LOS 14-18 days)  Team Members Present: Physician leading conference: Dr. Courtney Heys Social Worker Present: Loralee Pacas, LCSW) Nurse Present: Other (comment)(Amanda Archie, RN) Case  Manager: Karene Fry, RN PT Present: Apolinar Junes, PT OT Present: Elisabeth Most, OT SLP Present: Jettie Booze, CF-SLP PPS Coordinator present : Ileana Ladd, Burna Mortimer, SLP     Current Status/Progress Goal Weekly Team Focus  Bowel/Bladder   pt is continent of B&B  remain continent  assist with B&B qshift and prn   Swallow/Nutrition/ Hydration             ADL's   mod A UB and LB ADLS, min A ADL transfers, poor safety awareness  (S) overall  LUE NMR, safety awareness, ADL retraining, ADL transfers   Mobility   Min assist for mobility, gt 185f w/RW w/min assist, transfers max cues for safety, stairs min assist and max cues for safety and attention to LUE  Supervision overall  Balance, functional mobility, safety awareness, attention, gait and stair training, activity tolerance, patient/caregiver education   Communication   Eval Pending         Safety/Cognition/ Behavioral Observations  Eval Pending         Pain   pt c/o of pain in neck, prn tyneol  keep pain level <3  assess pain qshift and prn   Skin   generalized brusing  remain free of new skin breakdown  assses skin qshift and prn    Rehab Goals Patient on target to meet rehab goals: Yes *See Care Plan and progress notes for long and short-term goals.     Barriers to Discharge  Current Status/Progress Possible Resolutions Date Resolved   Nursing                  PT  Behavior;Lack of/limited family support  Per the evaluting therapy notes, patient will require 24/7 supervision at d/c due to decreased safety awareness              OT                  SLP                SW Lack of/limited family support;Decreased caregiver support Pt reports wife to have hip replacement on 3/2 and will have limited support from children            Discharge Planning/Teaching Needs:  D/C to home with wife to provde 24/7 care  Family education as recommended by therapy   Team Discussion: 69 yo R MCA CVA, L side weak,  chronic neck/back pain, started tramadol, constipation, bowel meds ordered, prostate CA.  RN had BM today, L arm swollen.  OT min a overall.  PT min A overall no device, 100' min A no device, 8 steps min A with 1 rail, multi level home.  Wife is scheduled for hip replacement 3/3, don locally, Dtr visiting, another son in [place].  SLP eval for cognition pending.   Revisions to Treatment Plan: N/A     Medical Summary Current Status: LBM this AM- not 5 days ago anymore; walked to bathroom; continent B/B; no wounds Weekly Focus/Goal: OT- arm weaker than leg; poor insight into deficits; min assist for self care  Barriers to Discharge: Behavior;Decreased family/caregiver support;Home enviroment access/layout;Other (comments);Medical stability  Barriers to Discharge Comments: R MCA CVA; SLP- doing eval today! for cognition; Possible Resolutions to Barriers: PT- min assist overall without AD transfers; no AD 100 ft; 8 steps 1 rail min assist; multilevel home   Continued Need for Acute Rehabilitation Level of Care: The patient requires daily medical management by a physician with specialized training in physical medicine and rehabilitation for the following reasons: Direction of a multidisciplinary physical rehabilitation program to maximize functional independence : Yes Medical management of patient stability for increased activity during participation in an intensive rehabilitation regime.: Yes Analysis of laboratory values and/or radiology reports with any subsequent need for medication adjustment and/or medical intervention. : Yes   I attest that I was present, lead the team conference, and concur with the assessment and plan of the team.   Jodell Cipro M 01/30/2020, 8:18 PM   Team conference was held via web/ teleconference due to [place] - 19

## 2020-01-29 NOTE — Progress Notes (Addendum)
[facility] PHYSICAL MEDICINE & REHABILITATION PROGRESS NOTE   Subjective/Complaints:   Pt reports in pain from neck and back- took tylenol without great improvement- took Norco at home- willing to try tramadol if doesn't work, will do Norco.   LBM 5 days ago.  Feels constipated.   Uses own pillow which helps pain in neck and back, but still there and bothersome- moderate level per pt.   ROS: denies SOB, CP, N/V/D; endorses constipation; denies abd pain.  Objective:   ECHO TEE  Result Date: 01/28/2020    TRANSESOPHOGEAL ECHO REPORT   Patient Name:   Ruben Chavez Date of Exam: 01/28/2020 Medical Rec #:  [id number]         Height:       69.0 in Accession #:    [id number]        Weight:       182.0 lb Date of Birth:  1951/02/06          BSA:          1.985 m Patient Age:    69 years          BP:           134/88 mmHg Patient Gender: M                 HR:           88 bpm. Exam Location:  Inpatient Procedure: Transesophageal Echo Indications:    CVA  History:        Patient has prior history of Echocardiogram examinations.                 Stroke.  Sonographer:    Mikki Santee RDCS (AE) Referring Phys: [id number] [place]: The transesophogeal probe was passed without difficulty through the esophogus of the patient. Sedation performed by different physician. The patient's vital signs; including heart rate, blood pressure, and oxygen saturation; remained stable throughout the procedure. The patient developed no complications during the procedure. IMPRESSIONS  1. No source of embolus Proceed with ILR.  2. Left ventricular ejection fraction, by estimation, is 60 to 65%. The left ventricle has normal function. The left ventricle has no regional wall motion abnormalities. Left ventricular diastolic function could not be evaluated. Left ventricular diastolic function could not be evaluated.  3. Right ventricular systolic function is normal. The right ventricular size is normal.  4. No  left atrial/left atrial appendage thrombus was detected.  5. The mitral valve is normal in structure and function. Trivial mitral valve regurgitation. No evidence of mitral stenosis.  6. The aortic valve is normal in structure and function. Aortic valve regurgitation is not visualized. No aortic stenosis is present.  7. The inferior vena cava is normal in size with greater than 50% respiratory variability, suggesting right atrial pressure of 3 mmHg.  8. Agitated saline contrast bubble study was negative, with no evidence of any interatrial shunt. Conclusion(s)/Recommendation(s): Normal biventricular function without evidence of hemodynamically significant valvular heart disease. FINDINGS  Left Ventricle: Left ventricular ejection fraction, by estimation, is 60 to 65%. The left ventricle has normal function. The left ventricle has no regional wall motion abnormalities. The left ventricular internal cavity size was normal in size. There is  no left ventricular hypertrophy. Left ventricular diastolic function could not be evaluated. Right Ventricle: The right ventricular size is normal. No increase in right ventricular wall thickness. Right ventricular systolic function is normal. Left Atrium: Left atrial size was normal in  size. No left atrial/left atrial appendage thrombus was detected. Right Atrium: Right atrial size was normal in size. Pericardium: There is no evidence of pericardial effusion. Mitral Valve: The mitral valve is normal in structure and function. Normal mobility of the mitral valve leaflets. Trivial mitral valve regurgitation. No evidence of mitral valve stenosis. Tricuspid Valve: The tricuspid valve is normal in structure. Tricuspid valve regurgitation is mild . No evidence of tricuspid stenosis. Aortic Valve: The aortic valve is normal in structure and function. Aortic valve regurgitation is not visualized. No aortic stenosis is present. Pulmonic Valve: The pulmonic valve was normal in structure.  Pulmonic valve regurgitation is mild. No evidence of pulmonic stenosis. Aorta: The aortic root is normal in size and structure. Venous: The inferior vena cava is normal in size with greater than 50% respiratory variability, suggesting right atrial pressure of 3 mmHg. IAS/Shunts: No atrial level shunt detected by color flow Doppler. Agitated saline contrast was given intravenously to evaluate for intracardiac shunting. Agitated saline contrast bubble study was negative, with no evidence of any interatrial shunt. Additional Comments: No source of embolus Proceed with ILR. Jenkins Rouge MD Electronically signed by Jenkins Rouge MD Signature Date/Time: 01/28/2020/2:09:41 PM    Final    Recent Labs    01/28/20 0400 01/29/20 0601  WBC 6.1 5.8  HGB 9.9* 10.2*  HCT 29.4* 30.3*  PLT 169 204   Recent Labs    01/28/20 0400 01/29/20 0601  NA 138 139  K 3.7 3.7  CL 108 107  CO2 24 23  GLUCOSE 111* 103*  BUN 11 12  CREATININE 0.90 0.88  CALCIUM 8.0* 8.5*    Intake/Output Summary (Last 24 hours) at 01/29/2020 0949 Last data filed at 01/29/2020 0834 Gross per 24 hour  Intake 200 ml  Output --  Net 200 ml     Physical Exam: Vital Signs Blood pressure (!) 155/72, pulse 69, temperature 98.1 F (36.7 C), height 5\' 11"  (1.803 m), weight 82.3 kg, SpO2 99 %.  Physical Exam Constitutional: He appearswell-developed. Appropriate, sitting up in manual w/c at sink, OT in room, doing grooming techniques, NAD  HENT:  Head:Normocephalic.  Eyes:conjugate gaze Cardiovascular:RRR  Respiratory:CTA B/L- no W/R/R- good air movement. Loop recorder chest wall [id number], distended; NT; (+) hypoactive BS.  Musculoskeletal:  General: Normal range of motion.  Cervical back:tight musculature in upper traps, levators and scalenes as well as rhomboids B/L Neurological: Pt is alert and oriented. Fair insight and awareness. Left central 7. Speech is dysarthric. RUE 5/5. LUE 2-3/5 prox to trace distally.  BLE 4+ to 5/5. No focal sensory findings.  Skin: Skin iswarm.  Psychiatric:appropriate, making jokes,   Assessment/Plan: 1. Functional deficits secondary to R MCA stroke with L hemiparesis which require 3+ hours per day of interdisciplinary therapy in a comprehensive inpatient rehab setting.  Physiatrist is providing close team supervision and 24 hour management of active medical problems listed below.  Physiatrist and rehab team continue to assess barriers to discharge/monitor patient progress toward functional and medical goals  Care Tool:  Bathing              Bathing assist       Upper Body Dressing/Undressing Upper body dressing        Upper body assist      Lower Body Dressing/Undressing Lower body dressing            Lower body assist       Toileting Toileting    Toileting assist  Transfers Chair/bed transfer  Transfers assist           Locomotion Ambulation   Ambulation assist              Walk 10 feet activity   Assist           Walk 50 feet activity   Assist           Walk 150 feet activity   Assist           Walk 10 feet on uneven surface  activity   Assist           Wheelchair     Assist               Wheelchair 50 feet with 2 turns activity    Assist            Wheelchair 150 feet activity     Assist          Blood pressure (!) 155/72, pulse 69, temperature 98.1 F (36.7 C), height 5\' 11"  (1.803 m), weight 82.3 kg, SpO2 99 %.  Medical Problem List and Plan: 1.Left side weakness with dysarthria and facial droopsecondary to right MCA infarction.Status post loop recorder placement -patient may shower -ELOS/Goals: 10-14 days, mod I 2. Antithrombotics: -DVT/anticoagulation:Lovenox -antiplatelet therapy: Aspirin 325 mg daily and Plavix 75 mg daily x3 months then aspirin alone 3. Pain Management:Tylenol as  needed  2/23- pt took Norco at home- willing to try tramadol 50 mg q6 hours prn ordered- if not effective, will do Norco 4. Mood:Provide emotional support -antipsychotic agents: N/A 5. Neuropsych: This patientiscapable of making decisions on hisown behalf. 6. Skin/Wound Care:Routine skin checks 7. Fluids/Electrolytes/Nutrition:Routine in and outs with follow-up chemistries  2/23- kidney function great and K+ is 3.7- con't to monitor  8. Hypertension. Lisinopril 20 mg daily. Monitor with increased mobility 9. Hyperlipidemia. Lipitor 10.Prostate cancerwith history of radioactive seed implant. Follow-up outpatient 11. Remote tobacco abuse. Continue to provide counseling 12. Constipation- LBM 5 days ago  2/23- will order Senokot-S 2 tabs qAM, and sorbitol and dulcolax suppository prn 13. Neck tightness/myofascial pain  2/23- might benefit from trigger point injections if tramadol not effective    LOS: 1 days A FACE TO FACE EVALUATION WAS PERFORMED  Krish Bailly 01/29/2020, 9:49 AM

## 2020-01-29 NOTE — Progress Notes (Signed)
Social Work Assessment and Plan   Patient Details  Name: Ruben Chavez MRN: [id number] Date of Birth: [birth year]  Today's Date: 01/29/2020  Problem List:  Patient Active Problem List   Diagnosis Date Noted  . Hypertensive urgency 01/28/2020  . Urinary retention 01/28/2020  . Right middle cerebral artery stroke ([place]) 01/28/2020  . Stroke (cerebrum) ([place]) -  [name] 01/25/2020  . Acute respiratory failure with hypoxia ([place])   . Hypotension   . Hypokalemia   . Hypocalcemia   . Encephalopathy acute   . Stroke ([place]) 01/24/2020  . Middle cerebral artery embolism, right 01/24/2020  . Hyperlipidemia 02/21/2019  . Impingement syndrome of right shoulder region 02/21/2018  . OA (osteoarthritis) of hip 01/18/2018  . Prostate cancer ([place]) 03/04/2016  . Essential hypertension 11/27/2014  . History of colonic polyps 09/26/2014  . BACK PAIN, LEFT 12/22/2009  . PROSTATE SPECIFIC ANTIGEN, ELEVATED 12/20/2008  . LATERAL EPICONDYLITIS 07/15/2008  . ACTINIC KERATOSIS, FOREHEAD, LEFT 05/23/2008  . WART, LEFT HAND 07/19/2007  . ERECTILE DYSFUNCTION, MILD 07/19/2007  . HERNIA, BILATERAL INGUINAL W/O OBST/GANGRENE 07/19/2007  . MENISCUS TEAR 07/19/2007  . Bilateral inguinal hernia 07/19/2007   Past Medical History:  Past Medical History:  Diagnosis Date  . Elevated PSA   . Hyperlipidemia    pt says not anymore  . Hypertension   . Prostate cancer [facility]) 2013   Past Surgical History:  Past Surgical History:  Procedure Laterality Date  . bilateral inguinal hernia  2008  . CYST REMOVAL TRUNK  2016   sebaceous cyst  . CYSTOSCOPY  04/22/2016   Procedure: CYSTOSCOPY;  Surgeon: [name], MD;  Location: [facility];  Service: Urology;;  . IR ANGIO VERTEBRAL SEL SUBCLAVIAN INNOMINATE UNI R MOD SED  01/25/2020  . IR CT HEAD LTD  01/25/2020  . IR PERCUTANEOUS ART  THROMBECTOMY/INFUSION INTRACRANIAL INC DIAG ANGIO  01/25/2020  . KNEE ARTHROSCOPY  2007   right  . LOOP RECORDER INSERTION N/A 01/28/2020   Procedure: LOOP RECORDER INSERTION;  Surgeon: [name], MD;  Location: [facility];  Service: Cardiovascular;  Laterality: N/A;  . PROSTATE BIOPSY  2008  . PROSTATE BIOPSY  2013  . PROSTATE BIOPSY  01/28/2016  . RADIOACTIVE SEED IMPLANT N/A 04/22/2016   Procedure: RADIOACTIVE SEED IMPLANT/BRACHYTHERAPY IMPLANT;  Surgeon: [name], MD;  Location: [facility];  Service: Urology;  Laterality: N/A;  . RADIOLOGY WITH ANESTHESIA N/A 01/24/2020   Procedure: IR WITH ANESTHESIA;  Surgeon: [name], MD;  Location: [facility];  Service: Radiology;  Laterality: N/A;  . TONSILLECTOMY    . TOTAL HIP ARTHROPLASTY  2001   left  . TOTAL HIP ARTHROPLASTY Right 01/18/2018   Procedure: RIGHT TOTAL HIP ARTHROPLASTY ANTERIOR APPROACH;  Surgeon: [name], MD;  Location: [facility];  Service: Orthopedics;  Laterality: Right;   Social History:  reports that he quit smoking about 20 years ago. His smoking use included cigarettes. He has a 32.00 pack-year smoking history. He has never used smokeless tobacco. He reports current alcohol use of about 14.0 standard drinks of alcohol per week. He reports that he does not use drugs.  Family / Support Systems Marital Status: Married How Long?: 42 years Patient Roles: Spouse, Parent Spouse/Significant Other: Hillery Aldo (wife): [phone] Children: 3 adult children. 1 son lives in [place] area; 1 son in [place], and dtr lives in  [place] Other Supports: None reported Anticipated Caregiver: Wife Ability/Limitations of Caregiver: Wife to have hip replacement on 3/2 Caregiver Availability: 24/7 Family Dynamics: Pt and wife live alone and managing  Social History Preferred language: English Religion: Baptist Cultural Background: Pt worked in Proofreader for 40 yrs. Education: [employer]-  BA Read: Yes Write: Yes Employment Status: Retired Public relations account executive Issues: Denies Guardian/Conservator: N/A   Abuse/Neglect Abuse/Neglect Assessment Can Be Completed: Yes Physical Abuse: Denies Verbal Abuse: Denies Sexual Abuse: Denies Exploitation of patient/patient's resources: Denies Self-Neglect: Denies  Emotional Status Pt's affect, behavior and adjustment status: Pt in good spirits. Pt concerned about being able to help his wife. Pt would like to d/c by the time of her hip surgery if possible. Recent Psychosocial Issues: Denies Psychiatric History: Denies Substance Abuse History: Pt admits to daily crown royal and diet ginger ale- 2/3 drinks daily.  Patient / Family Perceptions, Expectations & Goals Pt/Family understanding of illness & functional limitations: Pt adjusting to his current condition, and having a hard time accepting his limitations. Anticipated changes in roles/activities/participation: Pt will require support for ADLs/IADLS Pt/family expectations/goals: Pt would liek to be able to operate his arm better and move around " as he normally would."  [employer]: None Premorbid Home Care/DME Agencies: None Transportation available at discharge: Family to d/c to home Resource referrals recommended: Neuropsychology  Discharge Planning Living Arrangements: Spouse/significant other Support Systems: Spouse/significant other Type of Residence: Private residence Insurance Resources: Multimedia programmer (specify)(Health Team Advantage) Financial Resources: SSI Financial Screen Referred: No Living Expenses: Own Money Management: Spouse, Patient Does the patient have any problems obtaining your medications?: No Home Management: Pt mananged houseleeping; both managed bills. [place] Barriers to Discharge: Lack of/limited family support, Decreased caregiver support [place] Barriers to Discharge Comments: Pt reports wife to have hip replacement  on 3/2 and will have limited support from children Social Work Anticipated Follow Up Needs: [place] Additional Notes/Comments: Pt will d/c to home. Pt will require support for care needs.  Clinical Impression SW met with pt in room to introduce self, explain role, and discuss discharge process. Pt reports DME: cane, crutches, and shower seat (given to them). Pt admits his wife will not be physically able to help him. She is scheduled to have hip surgery on 3/2.   Rana Snare 01/29/2020, 5:19 PM

## 2020-01-29 NOTE — Evaluation (Signed)
Speech Language Pathology Assessment and Plan  Patient Details  Name: Ruben Chavez MRN: [id number] Date of Birth: Nov 17, 1951  SLP Diagnosis: Cognitive Impairments;Dysarthria  Rehab Potential: Excellent ELOS: 14-18 days    Today's Date: 01/29/2020 SLP Individual Time: 6269-4854 SLP Individual Time Calculation (min): 55 min   Problem List:  Patient Active Problem List   Diagnosis Date Noted  . Hypertensive urgency 01/28/2020  . Urinary retention 01/28/2020  . Right middle cerebral artery stroke ([place]) 01/28/2020  . Stroke (cerebrum) ([place]) -  [name] 01/25/2020  . Acute respiratory failure with hypoxia ([place])   . Hypotension   . Hypokalemia   . Hypocalcemia   . Encephalopathy acute   . Stroke ([place]) 01/24/2020  . Middle cerebral artery embolism, right 01/24/2020  . Hyperlipidemia 02/21/2019  . Impingement syndrome of right shoulder region 02/21/2018  . OA (osteoarthritis) of hip 01/18/2018  . Prostate cancer ([place]) 03/04/2016  . Essential hypertension 11/27/2014  . History of colonic polyps 09/26/2014  . BACK PAIN, LEFT 12/22/2009  . PROSTATE SPECIFIC ANTIGEN, ELEVATED 12/20/2008  . LATERAL EPICONDYLITIS 07/15/2008  . ACTINIC KERATOSIS, FOREHEAD, LEFT 05/23/2008  . WART, LEFT HAND 07/19/2007  . ERECTILE DYSFUNCTION, MILD 07/19/2007  . HERNIA, BILATERAL INGUINAL W/O OBST/GANGRENE 07/19/2007  . MENISCUS TEAR 07/19/2007  . Bilateral inguinal hernia 07/19/2007   Past Medical History:  Past Medical History:  Diagnosis Date  . Elevated PSA   . Hyperlipidemia    pt says not anymore  . Hypertension   . Prostate cancer [facility]) 2013   Past Surgical History:  Past Surgical History:  Procedure Laterality Date  . bilateral inguinal hernia  2008  . CYST REMOVAL TRUNK  2016   sebaceous cyst  . CYSTOSCOPY  04/22/2016   Procedure: CYSTOSCOPY;  Surgeon: [name],  MD;  Location: [facility];  Service: Urology;;  . IR ANGIO VERTEBRAL SEL SUBCLAVIAN INNOMINATE UNI R MOD SED  01/25/2020  . IR CT HEAD LTD  01/25/2020  . IR PERCUTANEOUS ART THROMBECTOMY/INFUSION INTRACRANIAL INC DIAG ANGIO  01/25/2020  . KNEE ARTHROSCOPY  2007   right  . LOOP RECORDER INSERTION N/A 01/28/2020   Procedure: LOOP RECORDER INSERTION;  Surgeon: [name], MD;  Location: [place] [facility];  Service: Cardiovascular;  Laterality: N/A;  . PROSTATE BIOPSY  2008  . PROSTATE BIOPSY  2013  . PROSTATE BIOPSY  01/28/2016  . RADIOACTIVE SEED IMPLANT N/A 04/22/2016   Procedure: RADIOACTIVE SEED IMPLANT/BRACHYTHERAPY IMPLANT;  Surgeon: [name], MD;  Location: [facility];  Service: Urology;  Laterality: N/A;  . RADIOLOGY WITH ANESTHESIA N/A 01/24/2020   Procedure: IR WITH ANESTHESIA;  Surgeon: [name], MD;  Location: [place];  Service: Radiology;  Laterality: N/A;  . TONSILLECTOMY    . TOTAL HIP ARTHROPLASTY  2001   left  . TOTAL HIP ARTHROPLASTY Right 01/18/2018   Procedure: RIGHT TOTAL HIP ARTHROPLASTY ANTERIOR APPROACH;  Surgeon: [name], MD;  Location: [facility];  Service: Orthopedics;  Laterality: Right;    Assessment / Plan / Recommendation Clinical Impression Patient is a 69 y.o.right-handed male with history of hyperlipidemia, hypertension, prostate cancer radioactive seed implant, remote tobacco abuse. Per chart review lives with spouse independent prior to admission. Two-level home half bath on the main level and one-step to entry. Wife can assist as needed. Presented 01/24/2020 with acute onset of left-sided weakness facial droop and  dysarthria. Cranial CT scan showed subacute infarction right lateral temporal lobe. No hemorrhage or mass-effect. CT angiogram of head and neck embolic occlusion of the superior division M2 branch on the right. Patient did receive TPA. Underwent revascularization procedure per interventional  radiology. Loop recorder placed 01/28/2020. Patient did have occasional bouts of orthostatic hypotension blood pressure in the 60s on 01/26/2020 with conservative care and monitored when out of bed. Therapy evaluations completed and patient was admitted for a comprehensive rehab program 01/28/20.  Patient was administered the [place]ist[place] in which he scored WFL on all subtests. However, mild-moderate deficits were noted in sustained attention, emergent awareness, and complex problem solving throughout functional tasks which impacts his overall safety.  Patient also demonstrated mild deficits in speech intelligibility at the conservation level due to left oral-motor weakness. Patient would benefit from skilled SLP intervention to maximize his cognitive functioning and speech intelligibility prior to discharge.    Skilled Therapeutic Interventions          Administered a cognitive-linguistic evaluation, please see above for details.   SLP Assessment  Patient will need skilled [place] [facility] during CIR admission    Recommendations  Patient destination: Home Follow up Recommendations: 24 hour supervision/assistance;Outpatient SLP Equipment Recommended: None recommended by SLP    SLP Frequency 3 to 5 out of 7 days   SLP Duration  SLP Intensity  SLP Treatment/Interventions 14-18 days  Minumum of 1-2 x/day, 30 to 90 minutes  Cognitive remediation/compensation;Internal/external aids;Speech/Language facilitation;Cueing hierarchy;Environmental controls;Therapeutic Activities;Functional tasks;Patient/family education    Pain No/Denies Pain  SLP Evaluation Cognition Overall Cognitive Status: Impaired/Different from baseline Arousal/Alertness: Awake/alert Orientation Level: Oriented X4 Focused Attention: Appears intact Sustained Attention: Impaired Selective Attention Impairment: Verbal complex;Functional complex Memory: Appears intact Problem Solving: Impaired Problem  Solving Impairment: Verbal complex;Functional complex Reasoning: Impaired Reasoning Impairment: Verbal complex Behaviors: Lability Safety/Judgment: Impaired  Comprehension Auditory Comprehension Overall Auditory Comprehension: Appears within functional limits for tasks assessed Visual Recognition/Discrimination Discrimination: Within Function Limits Reading Comprehension Reading Status: Within funtional limits Expression Expression Primary Mode of Expression: Verbal Verbal Expression Overall Verbal Expression: Appears within functional limits for tasks assessed Oral Motor Oral Motor/Sensory Function Overall Oral Motor/Sensory Function: Mild impairment Facial ROM: Reduced left Facial Symmetry: Abnormal symmetry left Facial Strength: Reduced left Facial Sensation: Within Functional Limits Lingual ROM: Within Functional Limits Lingual Symmetry: Abnormal symmetry left Lingual Strength: Reduced;Suspected CN XII (hypoglossal) dysfunction Lingual Sensation: Within Functional Limits Mandible: Within Functional Limits Motor Speech Overall Motor Speech: Impaired Respiration: Within functional limits Level of Impairment: Conversation Phonation: Normal Resonance: Within functional limits Articulation: Impaired Level of Impairment: Conversation Intelligibility: Intelligibility reduced Word: 75-100% accurate Phrase: 75-100% accurate Sentence: 75-100% accurate Conversation: 75-100% accurate Motor Planning: Witnin functional limits Effective Techniques: Slow rate;Increased vocal intensity   PMSV Assessment  PMSV Trial Intelligibility: Intelligibility reduced Word: 75-100% accurate Phrase: 75-100% accurate Sentence: 75-100% accurate Conversation: 75-100% accurate  Short Term Goals: Week 1: SLP Short Term Goal 1 (Week 1): Patient will demonstrate sustained attention to functional tasks for 45 minutes with Min verbal cues for redirection. SLP Short Term Goal 2 (Week 1): Patient  will demonstrate functional problem solving for mildly complex but familiar tasks with Min A verbal cues. SLP Short Term Goal 3 (Week 1): Patient will self-monitor and correct errors during functional tasks with Mod A verbal cues. SLP Short Term Goal 4 (Week 1): Patient will utilize speech intelligibility strategies at the conversation level with supervision verbal cues.  Refer to Care Plan for Long Term Goals  Recommendations for other services: Neuropsych  Discharge Criteria: Patient will be discharged from SLP if patient refuses treatment 3 consecutive times without medical reason, if treatment goals not met, if there is a change in medical status, if patient makes no progress towards goals or if patient is discharged from hospital.  The above assessment, treatment plan, treatment alternatives and goals were discussed and mutually agreed upon: by patient  Pardeep Pautz 01/29/2020, 3:12 PM

## 2020-01-29 NOTE — Progress Notes (Signed)
Physical Therapy Session Note  Patient Details  Name: Ruben Chavez MRN: [id number] Date of Birth: [birth year]  Today's Date: 01/29/2020 PT Individual Time: 1130-1200 PT Individual Time Calculation (min): 30 min   Short Term Goals: Week 1:  PT Short Term Goal 1 (Week 1): Pt will perform wc to/from bed/mat w/LRAD and cga, minimal cues for safety PT Short Term Goal 2 (Week 1): Pt will perform bed mobility w/supervision attending to LUE positioining PT Short Term Goal 3 (Week 1): Pt will ambulate in home environment greater than 130ft w/cga/LRAD  Skilled Therapeutic Interventions/Progress Updates:  Pt received seated EOB in room, agreeable to PT session. No complaints of pain. Pt hyperverbal throughout session and requires cues to focus on therapy tasks. Sit to stand with min A with no AD. Stand pivot transfer to w/c with min A. Patient demonstrates increased fall risk as noted by score of  18/56 on Berg Balance Scale.  (<36= high risk for falls, close to 100%; 37-45 significant >80%; 46-51 moderate >50%; 52-55 lower >25%). Discussed score on Berg and functional implications and fall risk. Pt with some insight into deficits and aware he has weakness in his L hemibody. Pt left seated in bed with needs in reach, bed alarm in place at end of session.  Therapy Documentation Precautions:  Precautions Precautions: Fall Precaution Comments: L hemi UE>LE Restrictions Weight Bearing Restrictions: No   Therapy/Group: Individual Therapy   Excell Seltzer, PT, DPT  01/29/2020, 12:36 PM

## 2020-01-29 NOTE — Evaluation (Signed)
Physical Therapy Assessment and Plan  Patient Details  Name: Ruben Chavez MRN: [id number] Date of Birth: [birth year]  PT Diagnosis: Abnormality of gait, Difficulty walking, Hemiparesis non-dominant, Impaired cognition and Impaired sensation Rehab Potential: Good ELOS: 14 days   Today's Date: 01/29/2020 PT Individual Time: 5852-7782 PT Individual Time Calculation (min): 60 min    Problem List:  Patient Active Problem List   Diagnosis Date Noted  . Hypertensive urgency 01/28/2020  . Urinary retention 01/28/2020  . Right middle cerebral artery stroke ([place]) 01/28/2020  . Stroke (cerebrum) ([place]) -  [name] 01/25/2020  . Acute respiratory failure with hypoxia ([place])   . Hypotension   . Hypokalemia   . Hypocalcemia   . Encephalopathy acute   . Stroke ([place]) 01/24/2020  . Middle cerebral artery embolism, right 01/24/2020  . Hyperlipidemia 02/21/2019  . Impingement syndrome of right shoulder region 02/21/2018  . OA (osteoarthritis) of hip 01/18/2018  . Prostate cancer ([place]) 03/04/2016  . Essential hypertension 11/27/2014  . History of colonic polyps 09/26/2014  . BACK PAIN, LEFT 12/22/2009  . PROSTATE SPECIFIC ANTIGEN, ELEVATED 12/20/2008  . LATERAL EPICONDYLITIS 07/15/2008  . ACTINIC KERATOSIS, FOREHEAD, LEFT 05/23/2008  . WART, LEFT HAND 07/19/2007  . ERECTILE DYSFUNCTION, MILD 07/19/2007  . HERNIA, BILATERAL INGUINAL W/O OBST/GANGRENE 07/19/2007  . MENISCUS TEAR 07/19/2007  . Bilateral inguinal hernia 07/19/2007    Past Medical History:  Past Medical History:  Diagnosis Date  . Elevated PSA   . Hyperlipidemia    pt says not anymore  . Hypertension   . Prostate cancer [facility]) 2013   Past Surgical History:  Past Surgical History:  Procedure Laterality Date  . bilateral inguinal hernia  2008  . CYST REMOVAL TRUNK  2016   sebaceous cyst  . CYSTOSCOPY  04/22/2016    Procedure: CYSTOSCOPY;  Surgeon: [name], MD;  Location: [facility];  Service: Urology;;  . IR ANGIO VERTEBRAL SEL SUBCLAVIAN INNOMINATE UNI R MOD SED  01/25/2020  . IR CT HEAD LTD  01/25/2020  . IR PERCUTANEOUS ART THROMBECTOMY/INFUSION INTRACRANIAL INC DIAG ANGIO  01/25/2020  . KNEE ARTHROSCOPY  2007   right  . LOOP RECORDER INSERTION N/A 01/28/2020   Procedure: LOOP RECORDER INSERTION;  Surgeon: [name], MD;  Location: [place] [facility];  Service: Cardiovascular;  Laterality: N/A;  . PROSTATE BIOPSY  2008  . PROSTATE BIOPSY  2013  . PROSTATE BIOPSY  01/28/2016  . RADIOACTIVE SEED IMPLANT N/A 04/22/2016   Procedure: RADIOACTIVE SEED IMPLANT/BRACHYTHERAPY IMPLANT;  Surgeon: [name], MD;  Location: [facility];  Service: Urology;  Laterality: N/A;  . RADIOLOGY WITH ANESTHESIA N/A 01/24/2020   Procedure: IR WITH ANESTHESIA;  Surgeon: [name], MD;  Location: [place];  Service: Radiology;  Laterality: N/A;  . TONSILLECTOMY    . TOTAL HIP ARTHROPLASTY  2001   left  . TOTAL HIP ARTHROPLASTY Right 01/18/2018   Procedure: RIGHT TOTAL HIP ARTHROPLASTY ANTERIOR APPROACH;  Surgeon: [name], MD;  Location: [facility];  Service: Orthopedics;  Laterality: Right;    Assessment & Plan Clinical Impression: Ruben Chavez is a 69 y.o.right-handed male with history of hyperlipidemia, hypertension, prostate cancer radioactive seed implant, remote tobacco abuse. Per chart review lives with spouse independent prior to admission. Two-level home half bath on the main level and one-step to entry. Presented 01/24/2020 with acute onset of left-sided  weakness facial droop and dysarthria. Cranial CT scan showed subacute infarction right lateral temporal lobe. No hemorrhage or mass-effect. CT angiogram of head and neck embolic occlusion of the superior division M2 branch on the right. Patient did receive TPA. Underwent revascularization  procedure per interventional radiology. Echocardiogram with ejection fraction of 65% without emboli. Venous Doppler studies negative. Neurology consulted maintain on aspirin and Plavix for CVA prophylaxis. Subcutaneous Lovenox for DVT prophylaxis. TEE showed ejection fraction 65% negative bubble study without thrombus. Loop recorder placed 01/28/2020. Patient did have occasional bouts of orthostatic hypotension blood pressure in the 60s on 01/26/2020 with conservative care and monitored when out of bed. Therapy evaluations completed and patient was admitted for a comprehensive rehab program.Patient transferred to CIR on 01/28/2020 .   Patient currently requires min with mobility secondary to muscle weakness, decreased cardiorespiratoy endurance, decreased coordination and weakness, decreased attention to left, decreased attention, decreased awareness and decreased safety awareness and decreased standing balance, hemiplegia and decreased balance strategies.  Prior to hospitalization, patient was independent  with mobility and lived with Spouse in a House home.  Home access is 1Stairs to enter. But home is 2L w/bedroom and bathroom on second level.  Wife is scheduled for THA on 02/06/2020.  Pt states his daughter is visiting and will stay for one week while his wife has surgery then returns to home out of state.  Another son in [place].  Third son local but limited in his ability to assist.  Pt is currently a very high fall risk due to inattention, poor insight into deficits (states there is no reason he cannot drive himself home), poor safety awareness, and impaired dynamic balance.  He would benefit from IPR to address these deficits.    Patient will benefit from skilled PT intervention to maximize safe functional mobility, minimize fall risk and decrease caregiver burden for planned discharge home with intermittent assist.  Anticipate patient will benefit from follow up [facility] at discharge.  PT - End of  Session Activity Tolerance: Tolerates 30+ min activity with multiple rests Endurance Deficit: Yes Endurance Deficit Description: dyspnea w/exertion, rests 2-3 min between mobility tasks to recover PT Assessment Rehab Potential (ACUTE/IP ONLY): Good PT Barriers to Discharge: [place] home environment;Decreased caregiver support;Home environment access/layout;Other (comments) PT Barriers to Discharge Comments: impaired insight into deficits, ie states he will drive his wife home following her surgery and this wont be a problem. PT Patient demonstrates impairments in the following area(s): Balance;Perception;Behavior;Safety;Sensory;Edema;Endurance;Skin Integrity;Motor PT Transfers Functional Problem(s): Bed Mobility;Bed to Chair;Car;Furniture PT Locomotion Functional Problem(s): Ambulation;Wheelchair Mobility;Stairs PT Plan PT Intensity: Minimum of 1-2 x/day ,45 to 90 minutes PT Frequency: 5 out of 7 days PT Duration Estimated Length of Stay: 14 days PT Treatment/Interventions: Ambulation/gait training;DME/adaptive equipment instruction;Neuromuscular re-education;Stair training;UE/LE Strength taining/ROM;Wheelchair propulsion/positioning;Balance/vestibular training;Discharge planning;Therapeutic Activities;UE/LE Coordination activities;Cognitive remediation/compensation;Functional mobility training;Patient/family education;Therapeutic Exercise PT Transfers Anticipated Outcome(s): supervision PT Locomotion Anticipated Outcome(s): supervision PT Recommendation Recommendations for Other Services: Speech consult Follow Up Recommendations: Home health PT Patient destination: Home Equipment Recommended: To be determined  Skilled Therapeutic Intervention  Pt oriented to rehab unit and educated re scheduling, team conference/dc planning, role of PT and goals of todays session.  Pt receptive to education.  Pt participated in full evaluation and interventions as follow: Gait 158f w/RW w/min assist  and verbal cues for attending to LUE and L environment.  Eduated pt in use of LUE orthosis w/RW.  See eval for gait without AD. Stairs:  Pt required much cuing for safety on stairs,  moves impulsively, chooses step over step strategy but not careful w/placement of L foot on stair and does not attend to L hand w/progression on rail. Car transfers:  Pt completed w/max verbal cueing required for safety.  Attempts to pull himself up using door, also attempts to stand from wc w/L foot already placed into car, cannot perform safely without instruction. Bed mobility:  Pt rolls onto LUE without awareness, w/supine to sit attempts to sit w/L UE caught behind back. Standing:  Worked on standing on uneven surface, increased sway, flexed posture, delayed ankle and hip strategy Repeated STS w/min assist x 8reps for dynamic balance challenge w/transition. Pt educated re:  Need to call for assist and use of chair alarm for safety.  Discussed need for comfortable clothing and walking shoes, pt calling wife at end of session to request she bring today. Pt left oob in wc w/alarm belt set and needs in reach    PT Evaluation Precautions/Restrictions Poor safety awareness, L inattention Precautions Precautions: Fall Precaution Comments: L hemi UE>LE Restrictions Weight Bearing Restrictions: No    Pain Pain Assessment Pain Scale: 0-10 Pain Score: 0-No pain Home Living/Prior Functioning Home Living Available Help at Discharge: Family Type of Home: House Home Access: Stairs to enter Technical brewer of Steps: 1 Entrance Stairs-Rails: None Home Layout: 1/2 bath on main level Alternate Level Stairs-Number of Steps: 14 Alternate Level Stairs-Rails: (single rail first 6 steps, landing then 2 rails for next 8) Bathroom Shower/Tub: Multimedia programmer: Standard Additional Comments: wife is home with him and she is retired, however, she is scheduled for THA on 02/06/20. Good family support from  children though too.   Lives With: Spouse Prior Function Level of Independence: Independent with basic ADLs;Independent with gait;Independent with homemaking with ambulation;Independent with transfers  Able to Take Stairs?: Yes Driving: (has no insight into fact that driving will be a challenge at this time) Vocation: Retired Comments: Recently retired in July. ADLs and IADLs. Cognition Overall Cognitive Status: Impaired/Different from baseline Arousal/Alertness: Awake/alert Orientation Level: Oriented X4 Attention: Sustained Focused Attention: Appears intact Focused Attention Impairment: Functional complex Sustained Attention: Impaired Selective Attention: Impaired Selective Attention Impairment: Verbal complex;Functional complex Memory: Appears intact Immediate Memory Recall: Sock;Blue;Bed Memory Recall Sock: Without Cue Memory Recall Blue: Without Cue Memory Recall Bed: Without Cue Awareness: Impaired Awareness Impairment: Emergent impairment Problem Solving: Impaired Problem Solving Impairment: Verbal complex Reasoning: Impaired Reasoning Impairment: Verbal complex Sequencing: Impaired Sequencing Impairment: Verbal complex Safety/Judgment: Impaired Sensation Sensation Light Touch: Impaired Detail Light Touch Impaired Details: Impaired LUE Proprioception: Impaired Detail Proprioception Impaired Details: Impaired LUE Coordination Gross Motor Movements are Fluid and Coordinated: No Fine Motor Movements are Fluid and Coordinated: No Coordination and Movement Description: L hemi UE>LE Finger Nose Finger Test: poor hand function, poor control LUE Heel Shin Test: impaired LLE w/some ataxia Motor  Motor Motor: Hemiplegia Motor - Skilled Clinical Observations: R LE grossly 4+/5, LLE grossly 4-4+/5 hamstrings and DF L  Mobility Bed Mobility Bed Mobility: Rolling Right;Supine to Sit;Rolling Left;Sitting - Scoot to Edge of Bed;Sit to Supine Rolling Right: Supervision/verbal  cueing Rolling Left: Supervision/Verbal cueing Supine to Sit: Minimal Assistance - Patient > 75% Sitting - Scoot to [employer] of Bed: Contact Guard/Touching assist Sit to Supine: Contact Guard/Touching assist Transfers Transfers: Sit to Stand;Stand to Sit;Stand Pivot Transfers Sit to Stand: Minimal Assistance - Patient > 75% Stand to Sit: Contact Guard/Touching assist Stand Pivot Transfers: Minimal Assistance - Patient > 75% Stand Pivot Transfer Details: Verbal cues for sequencing;Verbal  cues for precautions/safety;Tactile cues for sequencing Stand Pivot Transfer Details (indicate cue type and reason): very poor safety awareness Transfer (Assistive device): None Locomotion  Gait Ambulation: Yes Gait Assistance: Minimal Assistance - Patient > 75% Gait Distance (Feet): 100 Feet Assistive device: None Gait Assistance Details: decreased step length bilat, increased lateral trunk excursions, balance loss turning requiring min assist for recovery, flexed posture, slow cadence, decreased attention to L Gait Gait: Yes Gait Pattern: Impaired(decreased step length bilat, increased lateral trunk excursions, balance loss turning requiring min assist for recovery, flexed posture, slow cadence, decreased attention to L) Gait velocity: decreased Stairs / Additional Locomotion Stairs: Yes Stairs Assistance: Minimal Assistance - Patient > 75%(and max verbal cues, poor safety awareness, poor attention to LUE) Stair Management Technique: One rail Right Number of Stairs: 8 Height of Stairs: 5 Curb: Moderate Assistance - Patient 50 - 74%(with no rail) Wheelchair Mobility Wheelchair Mobility: Yes Wheelchair Assistance: Minimal assistance - Patient >75%(uses bilat LEs and RUE, decreased attention to L) Wheelchair Parts Management: Needs assistance Distance: 75  Trunk/Postural Assessment  Cervical Assessment Cervical Assessment: Within Functional Limits(age related changes) Thoracic Assessment Thoracic  Assessment: Within Functional Limits(age related changes) Lumbar Assessment Lumbar Assessment: Within Functional Limits Postural Control Postural Control: Deficits on evaluation(delayed LUE/LLE balance reactions)  Balance Balance Balance Assessed: Yes Dynamic Sitting Balance Dynamic Sitting - Level of Assistance: 6: Modified independent (Device/Increase time)(but poor safety awarenss, attempts to reach under bed to retrieve washcloth/distracted by this) Static Standing Balance Static Standing - Balance Support: No upper extremity supported(requires min assist w/static stand) Dynamic Standing Balance Dynamic Standing - Balance Support: Bilateral upper extremity supported;During functional activity  SEE BERG 18/56 Extremity Assessment  RUE Assessment RUE Assessment: Within Functional Limits General Strength Comments: see OT eval for full assessment of RUE LUE Assessment LUE Assessment: Exceptions to [facility] Active Range of Motion (AROM) Comments: shoulder abd/flex to 90, full AROM at elbow, wrist/hand w/minimal AROM see OT eval for detailed assessment RLE Assessment RLE Assessment: Within Functional Limits LLE Assessment General Strength Comments: grossly 4+/5 throughout w/mild deficits w/resistive testing at hamstrings, hip flexors, ankle DF.  No knee buckling or signficant instability at hip w/gait    Refer to Care Plan for Long Term Goals  Recommendations for other services: Other: speech therapy consult to further assess cognitive issues  Discharge Criteria: Patient will be discharged from PT if patient refuses treatment 3 consecutive times without medical reason, if treatment goals not met, if there is a change in medical status, if patient makes no progress towards goals or if patient is discharged from hospital.  The above assessment, treatment plan, treatment alternatives and goals were discussed and mutually agreed upon: by patient  Jerrilyn Cairo 01/29/2020, 10:45 AM

## 2020-01-29 NOTE — Evaluation (Signed)
Occupational Therapy Assessment and Plan  Patient Details  Name: Ruben Chavez MRN: [id number] Date of Birth: June 22, 1951  OT Diagnosis: cognitive deficits, hemiplegia affecting non-dominant side and muscle weakness (generalized) Rehab Potential: Rehab Potential (ACUTE ONLY): Good ELOS: 10-14 days   Today's Date: 01/29/2020 OT Individual Time: 1638-4536 OT Individual Time Calculation (min): 60 min     Problem List:  Patient Active Problem List   Diagnosis Date Noted  . Hypertensive urgency 01/28/2020  . Urinary retention 01/28/2020  . Right middle cerebral artery stroke ([place]) 01/28/2020  . Stroke (cerebrum) ([place]) -  [name] 01/25/2020  . Acute respiratory failure with hypoxia ([place])   . Hypotension   . Hypokalemia   . Hypocalcemia   . Encephalopathy acute   . Stroke ([place]) 01/24/2020  . Middle cerebral artery embolism, right 01/24/2020  . Hyperlipidemia 02/21/2019  . Impingement syndrome of right shoulder region 02/21/2018  . OA (osteoarthritis) of hip 01/18/2018  . Prostate cancer ([place]) 03/04/2016  . Essential hypertension 11/27/2014  . History of colonic polyps 09/26/2014  . BACK PAIN, LEFT 12/22/2009  . PROSTATE SPECIFIC ANTIGEN, ELEVATED 12/20/2008  . LATERAL EPICONDYLITIS 07/15/2008  . ACTINIC KERATOSIS, FOREHEAD, LEFT 05/23/2008  . WART, LEFT HAND 07/19/2007  . ERECTILE DYSFUNCTION, MILD 07/19/2007  . HERNIA, BILATERAL INGUINAL W/O OBST/GANGRENE 07/19/2007  . MENISCUS TEAR 07/19/2007  . Bilateral inguinal hernia 07/19/2007    Past Medical History:  Past Medical History:  Diagnosis Date  . Elevated PSA   . Hyperlipidemia    pt says not anymore  . Hypertension   . Prostate cancer [facility]) 2013   Past Surgical History:  Past Surgical History:  Procedure Laterality Date  . bilateral inguinal hernia  2008  . CYST REMOVAL TRUNK  2016   sebaceous cyst  .  CYSTOSCOPY  04/22/2016   Procedure: CYSTOSCOPY;  Surgeon: [name], MD;  Location: [facility];  Service: Urology;;  . IR ANGIO VERTEBRAL SEL SUBCLAVIAN INNOMINATE UNI R MOD SED  01/25/2020  . IR CT HEAD LTD  01/25/2020  . IR PERCUTANEOUS ART THROMBECTOMY/INFUSION INTRACRANIAL INC DIAG ANGIO  01/25/2020  . KNEE ARTHROSCOPY  2007   right  . LOOP RECORDER INSERTION N/A 01/28/2020   Procedure: LOOP RECORDER INSERTION;  Surgeon: [name], MD;  Location: [place] [facility];  Service: Cardiovascular;  Laterality: N/A;  . PROSTATE BIOPSY  2008  . PROSTATE BIOPSY  2013  . PROSTATE BIOPSY  01/28/2016  . RADIOACTIVE SEED IMPLANT N/A 04/22/2016   Procedure: RADIOACTIVE SEED IMPLANT/BRACHYTHERAPY IMPLANT;  Surgeon: [name], MD;  Location: [facility];  Service: Urology;  Laterality: N/A;  . RADIOLOGY WITH ANESTHESIA N/A 01/24/2020   Procedure: IR WITH ANESTHESIA;  Surgeon: [name], MD;  Location: [place];  Service: Radiology;  Laterality: N/A;  . TONSILLECTOMY    . TOTAL HIP ARTHROPLASTY  2001   left  . TOTAL HIP ARTHROPLASTY Right 01/18/2018   Procedure: RIGHT TOTAL HIP ARTHROPLASTY ANTERIOR APPROACH;  Surgeon: [name], MD;  Location: [facility];  Service: Orthopedics;  Laterality: Right;    Assessment & Plan Clinical Impression: Ruben Chavez is a 69 y.o.right-handed male with history of hyperlipidemia, hypertension, prostate cancer radioactive seed implant, remote tobacco abuse. Per chart review lives with spouse independent prior to admission. Two-level home half bath on the main level and one-step to entry. Wife can assist as  needed. Presented 01/24/2020 with acute onset of left-sided weakness facial droop and dysarthria. Cranial CT scan showed subacute infarction right lateral temporal lobe. No hemorrhage or mass-effect. CT angiogram of head and neck embolic occlusion of the superior division M2 branch on the right. Patient did  receive TPA. Underwent revascularization procedure per interventional radiology. Echocardiogram with ejection fraction of 65% without emboli. Venous Doppler studies negative. Neurology consulted maintain on aspirin and Plavix for CVA prophylaxis. Subcutaneous Lovenox for DVT prophylaxis. TEE showed ejection fraction 65% negative bubble study without thrombus. Loop recorder placed 01/28/2020. Patient did have occasional bouts of orthostatic hypotension blood pressure in the 60s on 01/26/2020 with conservative care and monitored when out of bed. Therapy evaluations completed and patient was admitted for a comprehensive rehab program..  Patient transferred to CIR on 01/28/2020 .    Patient currently requires mod with basic self-care skills secondary to muscle weakness, unbalanced muscle activation and decreased coordination, decreased attention to left, decreased attention, decreased awareness, decreased problem solving, decreased safety awareness, decreased memory and delayed processing and decreased standing balance, decreased postural control, hemiplegia and decreased balance strategies.  Prior to hospitalization, patient could complete ADLs/IADLs with independent .  Patient will benefit from skilled intervention to decrease level of assist with basic self-care skills and increase level of independence with iADL prior to discharge home with care partner.  Anticipate patient will require 24 hour supervision and follow up home health.  OT - End of Session Activity Tolerance: Tolerates 10 - 20 min activity with multiple rests Endurance Deficit: Yes Endurance Deficit Description: dyspnea w/exertion, rests 2-3 min between mobility tasks to recover OT Assessment Rehab Potential (ACUTE ONLY): Good OT Patient demonstrates impairments in the following area(s): Balance;Perception;Safety;Cognition;Sensory;Edema;Endurance;Motor;Pain OT Basic ADL's Functional Problem(s): Bathing;Dressing;Toileting OT  Transfers Functional Problem(s): Toilet;Tub/Shower OT Additional Impairment(s): Fuctional Use of Upper Extremity OT Plan OT Intensity: Minimum of 1-2 x/day, 45 to 90 minutes OT Frequency: 5 out of 7 days OT Duration/Estimated Length of Stay: 10-14 days OT Treatment/Interventions: Balance/vestibular training;Discharge planning;Functional electrical stimulation;Pain management;Self Care/advanced ADL retraining;Therapeutic Activities;UE/LE Coordination activities;Therapeutic Exercise;Visual/perceptual remediation/compensation;Patient/family education;Functional mobility training;Disease mangement/prevention;Cognitive remediation/compensation;Community reintegration;Wheelchair propulsion/positioning;UE/LE Strength taining/ROM;Splinting/orthotics;Psychosocial support;Neuromuscular re-education;DME/adaptive equipment instruction OT Self Feeding Anticipated Outcome(s): no goal set OT Basic Self-Care Anticipated Outcome(s): (S) OT Toileting Anticipated Outcome(s): (S) OT Bathroom Transfers Anticipated Outcome(s): (S) OT Recommendation Patient destination: Home Follow Up Recommendations: Home health OT Equipment Recommended: To be determined   Skilled Therapeutic Intervention Skilled OT evaluation completed. Pt edu on OT POC, ELOS, goals, and rehab expectations. Pt refused to complete any ADLs despite edu re need for ADL assessment. Pt with poor awareness of deficits during session but good carryover of therapy concepts. L UE orthosis applied to RW to support hand during functional mobility. Pt able to carryover placing hand on/off splint with min cueing. Pt able to complete grooming tasks and oral hygiene in standing with no LOB. Mod HOH facilitation provided for LUE to act as gross assist/stabilizer. Pt verbose throughout session and requiring mod cueing overall to redirect to session and sustain attention to task. Pt was left sitting up in the w/c with chair alarm belt fastened and all needs met.   OT  Evaluation Precautions/Restrictions  Precautions Precautions: Fall Precaution Comments: L hemi UE>LE Restrictions Weight Bearing Restrictions: No General Chart Reviewed: Yes Family/Caregiver Present: No Vital Signs Therapy Vitals BP: 133/73 Pain Pain Assessment Pain Scale: 0-10 Pain Score: 0-No pain Home Living/Prior Functioning Home Living Family/patient expects to be discharged to:: Private residence Available Help at Discharge:  Family Type of Home: House Home Access: Stairs to enter Technical brewer of Steps: 1 Entrance Stairs-Rails: None Home Layout: 1/2 bath on main level Alternate Level Stairs-Number of Steps: 14 Alternate Level Stairs-Rails: (single rail first 6 steps, landing then 2 rails for next 8) Bathroom Shower/Tub: Multimedia programmer: Standard Additional Comments: wife is home with him and she is retired, however, she is scheduled for THA on 02/06/20. Good family support from children though too.   Lives With: Spouse IADL History Homemaking Responsibilities: Yes Meal Prep Responsibility: Secondary Laundry Responsibility: Secondary Cleaning Responsibility: Secondary Bill Paying/Finance Responsibility: Secondary Shopping Responsibility: Secondary Child Care Responsibility: No Current License: Yes Occupation: Retired Prior Function Level of Independence: Independent with basic ADLs, Independent with gait, Independent with homemaking with ambulation, Independent with transfers  Able to Take Stairs?: Yes Driving: (has no insight into fact that driving will be a challenge at this time) Vocation: Retired Comments: Recently retired in July. ADLs and IADLs. ADL   Vision Baseline Vision/History: Wears glasses Wears Glasses: Reading only Patient Visual Report: Diplopia(reports one incidence of diplopia) Vision Assessment?: Yes Eye Alignment: Within Functional Limits Ocular Range of Motion: Within Functional Limits Tracking/Visual Pursuits:  Requires cues, head turns, or add eye shifts to track Saccades: Additional eye shifts occurred during testing;Additional head turns occurred during testing Convergence: Within functional limits Perception  Perception: Impaired Inattention/Neglect: Impaired-to be further tested in functional context Praxis Praxis: Impaired Praxis Impairment Details: Motor planning Praxis-Other Comments: To be investigated further Cognition Overall Cognitive Status: Impaired/Different from baseline Arousal/Alertness: Awake/alert Orientation Level: Person;Place;Situation Person: Oriented Place: Oriented Situation: Oriented Year: 2021 Month: February Day of Week: Correct Memory: Appears intact Immediate Memory Recall: Sock;Blue;Bed Memory Recall Sock: Without Cue Memory Recall Blue: Without Cue Memory Recall Bed: Without Cue Attention: Sustained Focused Attention: Appears intact Focused Attention Impairment: Functional complex Sustained Attention: Impaired Selective Attention: Impaired Selective Attention Impairment: Verbal complex;Functional complex Awareness: Impaired Awareness Impairment: Emergent impairment Problem Solving: Impaired Problem Solving Impairment: Verbal complex Reasoning: Impaired Reasoning Impairment: Verbal complex Sequencing: Impaired Sequencing Impairment: Verbal complex Safety/Judgment: Impaired Sensation Sensation Light Touch: Impaired Detail Light Touch Impaired Details: Impaired LUE Proprioception: Impaired Detail Proprioception Impaired Details: Impaired LUE Coordination Gross Motor Movements are Fluid and Coordinated: No Fine Motor Movements are Fluid and Coordinated: No Coordination and Movement Description: L hemi UE>LE Finger Nose Finger Test: poor hand function, poor control LUE Heel Shin Test: impaired LLE w/some ataxia Motor  Motor Motor: Hemiplegia Motor - Skilled Clinical Observations: R LE grossly 4+/5, LLE grossly 4-4+/5 hamstrings and DF  L Mobility  Bed Mobility Bed Mobility: Rolling Right;Supine to Sit;Rolling Left;Sitting - Scoot to Edge of Bed;Sit to Supine Rolling Right: Supervision/verbal cueing Rolling Left: Supervision/Verbal cueing Supine to Sit: Minimal Assistance - Patient > 75% Sitting - Scoot to [employer] of Bed: Contact Guard/Touching assist Sit to Supine: Contact Guard/Touching assist Transfers Sit to Stand: Minimal Assistance - Patient > 75% Stand to Sit: Contact Guard/Touching assist  Trunk/Postural Assessment  Cervical Assessment Cervical Assessment: Within Functional Limits Thoracic Assessment Thoracic Assessment: Within Functional Limits Lumbar Assessment Lumbar Assessment: Within Functional Limits Postural Control Postural Control: Deficits on evaluation Righting Reactions: delayed Protective Responses: delayed  Balance Balance Balance Assessed: Yes Static Sitting Balance Static Sitting - Balance Support: Feet supported Static Sitting - Level of Assistance: 6: Modified independent (Device/Increase time) Dynamic Sitting Balance Dynamic Sitting - Level of Assistance: 6: Modified independent (Device/Increase time) Static Standing Balance Static Standing - Balance Support: No upper extremity supported;During functional activity Static  Standing - Level of Assistance: 4: Min assist Dynamic Standing Balance Dynamic Standing - Balance Support: During functional activity Dynamic Standing - Level of Assistance: 4: Min assist Extremity/Trunk Assessment RUE Assessment RUE Assessment: Within Functional Limits General Strength Comments: see OT eval for full assessment of RUE LUE Assessment LUE Assessment: Exceptions to [facility] Active Range of Motion (AROM) Comments: shoulder abd/flex to 90, full AROM at elbow, wrist/hand w/minimal AROM see OT eval for detailed assessment General Strength Comments: No activation palpated in the hand/wrist. Edema present LUE Body System: Neuro Brunstrum levels for arm and  hand: Arm;Hand Brunstrum level for arm: Stage III Synergy is performed voluntarily Brunstrum level for hand: Stage III Synergies performed voluntarily LUE AROM (degrees) Left Shoulder Flexion: 90 Degrees Left Elbow Flexion: 45     Refer to Care Plan for Long Term Goals  Recommendations for other services: None    Discharge Criteria: Patient will be discharged from OT if patient refuses treatment 3 consecutive times without medical reason, if treatment goals not met, if there is a change in medical status, if patient makes no progress towards goals or if patient is discharged from hospital.  The above assessment, treatment plan, treatment alternatives and goals were discussed and mutually agreed upon: by patient  Curtis Sites 01/29/2020, 11:20 AM

## 2020-01-30 ENCOUNTER — Inpatient Hospital Stay (HOSPITAL_COMMUNITY): Payer: PPO

## 2020-01-30 ENCOUNTER — Encounter (HOSPITAL_COMMUNITY): Payer: PPO | Admitting: Psychology

## 2020-01-30 ENCOUNTER — Inpatient Hospital Stay (HOSPITAL_COMMUNITY): Payer: PPO | Admitting: Occupational Therapy

## 2020-01-30 ENCOUNTER — Inpatient Hospital Stay (HOSPITAL_COMMUNITY): Payer: PPO | Admitting: Speech Pathology

## 2020-01-30 DIAGNOSIS — F09 Unspecified mental disorder due to known physiological condition: Secondary | ICD-10-CM

## 2020-01-30 DIAGNOSIS — F0789 Other personality and behavioral disorders due to known physiological condition: Secondary | ICD-10-CM

## 2020-01-30 NOTE — Progress Notes (Signed)
Social Work Patient ID: Ruben Chavez, male   DOB: 12/19/1950, 69 y.o.   MRN: [id number]   SW met with pt in room to provide updates from rehab team, and anticipated d/c date within 14-18 days. Pt reports that his dtr will be staying an additional week to help after mother's surgery.   Loralee Pacas, MSW, [place] Office: [phone] Cell: [phone] Fax: [phone]

## 2020-01-30 NOTE — Progress Notes (Signed)
Occupational Therapy Session Note  Patient Details  Name: Ruben Chavez MRN: [id number] Date of Birth: Mar 25, 1951  Today's Date: 01/30/2020 OT Individual Time: [id number] OT Individual Time Calculation (min): 70 min    Short Term Goals: Week 1:  OT Short Term Goal 1 (Week 1): Pt will require no more than min cueing for use of hemi technique during dressing OT Short Term Goal 2 (Week 1): Pt will don shirt with min A OT Short Term Goal 3 (Week 1): Pt will don pants with min A OT Short Term Goal 4 (Week 1): Pt will transfer to toilet with LRAD with CGA  Skilled Therapeutic Interventions/Progress Updates:    1:1 Pt in bed when arrived and on the phone. Self care retraining at shower level with focus on stand pivot transfers, standing balance during functional tasks, safety with mobility, sequencing, functional problem solving and slowing down self and attention to use of left UE. Pt performed transfer from bed to w/c with min A with cues for management of left UE. Pt able to stand with min guard to brush teeth. Pt required max cues to maintain focus on task to complete task. Pt very internally distracted and difficulty with completing task before moving on to the next task. Pt ambulated with RW to shower and bathed sit to stand with min A. Pt with difficult with isolated use of LE UE in shower with support but once got the concept of "turning on the UE" for task pt able to complete with mod A . Focus on hemi dressing techniques with max cues to follow through with tasks. Pt required A to button and zipper pants in standing. Pt left at the sink to to complete shaving with electric razor prior to next appointment.   Therapy Documentation Precautions:  Precautions Precautions: Fall Precaution Comments: L hemi UE>LE Restrictions Weight Bearing Restrictions: No Pain: No c/o pain in session; Pt wth swollen left Ue mid shaft of humerus to fingers. Reported to MD and PA.   Therapy/Group: Individual  Therapy  Willeen Cass [facility] 01/30/2020, 3:30 PM

## 2020-01-30 NOTE — Progress Notes (Signed)
[place] PHYSICAL MEDICINE & REHABILITATION PROGRESS NOTE   Subjective/Complaints:   Had 2 BMs in last 24 hours- feeling much better- no constipation now.   Room is warm- asking temp to be lowered by 2 degrees.  Asking about d/c date   ROS: denies more stroke symptoms, SOB, CP, N/V/C/D  .  Objective:   No results found. Recent Labs    01/28/20 0400 01/29/20 0601  WBC 6.1 5.8  HGB 9.9* 10.2*  HCT 29.4* 30.3*  PLT 169 204   Recent Labs    01/28/20 0400 01/29/20 0601  NA 138 139  K 3.7 3.7  CL 108 107  CO2 24 23  GLUCOSE 111* 103*  BUN 11 12  CREATININE 0.90 0.88  CALCIUM 8.0* 8.5*    Intake/Output Summary (Last 24 hours) at 01/30/2020 1843 Last data filed at 01/30/2020 1822 Gross per 24 hour  Intake 876 ml  Output -  Net 876 ml     Physical Exam: Vital Signs Blood pressure 128/73, pulse 71, temperature 98.1 F (36.7 C), resp. rate 17, height 5\' 11"  (1.803 m), weight 82.3 kg, SpO2 100 %.  Physical Exam Constitutional: awake, alert, finished entire breakfast tray, in bed; sitting, watching TV,  NAD  HENT:  Head:Normocephalic.  Eyes:conjugate gaze; no nystagmus Cardiovascular:RRR, no M/R/G  Respiratory: CTA B/L- no W/R/R Loop recorder chest wall [id number], NT, ND, (+) hyperactive from breakfast Musculoskeletal:  General: Normal range of motion.  Cervical back:tight musculature in upper traps, levators and scalenes as well as rhomboids B/L Neurological: Pt is alert and oriented. Fair insight and awareness. Left central 7. Speech is dysarthric. RUE 5/5. LUE 2-3/5 prox to trace distally. BLE 4+ to 5/5. No focal sensory findings.  Skin: Skin iswarm.  Psychiatric:appropriate, very talkative   Assessment/Plan: 1. Functional deficits secondary to R MCA stroke with L hemiparesis which require 3+ hours per day of interdisciplinary therapy in a comprehensive inpatient rehab setting.  Physiatrist is providing close team supervision and 24  hour management of active medical problems listed below.  Physiatrist and rehab team continue to assess barriers to discharge/monitor patient progress toward functional and medical goals  Care Tool:  Bathing  Bathing activity did not occur: Refused Body parts bathed by patient: Chest, Left arm, Abdomen, Face, Front perineal area, Right upper leg, Left upper leg, Right lower leg, Left lower leg   Body parts bathed by helper: Buttocks, Right arm     Bathing assist Assist Level: Minimal Assistance - Patient > 75%     Upper Body Dressing/Undressing Upper body dressing Upper body dressing/undressing activity did not occur (including orthotics): Refused What is the patient wearing?: Pull over shirt    Upper body assist Assist Level: Moderate Assistance - Patient 50 - 74%    Lower Body Dressing/Undressing Lower body dressing    Lower body dressing activity did not occur: Refused What is the patient wearing?: Pants, Underwear/pull up     Lower body assist Assist for lower body dressing: Moderate Assistance - Patient 50 - 74%     Toileting Toileting Toileting Activity did not occur (Clothing management and hygiene only): N/A (no void or bm)  Toileting assist Assist for toileting: Moderate Assistance - Patient 50 - 74%     Transfers Chair/bed transfer  Transfers assist     Chair/bed transfer assist level: Minimal Assistance - Patient > 75%     Locomotion Ambulation   Ambulation assist      Assist level: Minimal Assistance - Patient > 75%  Assistive device: No Device Max distance: 100   Walk 10 feet activity   Assist     Assist level: Minimal Assistance - Patient > 75% Assistive device: No Device   Walk 50 feet activity   Assist    Assist level: Minimal Assistance - Patient > 75% Assistive device: No Device    Walk 150 feet activity   Assist Walk 150 feet activity did not occur: Safety/medical concerns(distance not tolerated today)          Walk 10 feet on uneven surface  activity   Assist     Assist level: Minimal Assistance - Patient > 75% Assistive device: Hand held assist(pt unsafe without UE support)   Wheelchair     Assist Will patient use wheelchair at discharge?: No             Wheelchair 50 feet with 2 turns activity    Assist            Wheelchair 150 feet activity     Assist          Blood pressure 128/73, pulse 71, temperature 98.1 F (36.7 C), resp. rate 17, height 5\' 11"  (1.803 m), weight 82.3 kg, SpO2 100 %.  Medical Problem List and Plan: 1.Left side weakness with dysarthria and facial droopsecondary to right MCA infarction.Status post loop recorder placement -patient may shower -ELOS/Goals: 10-14 days, mod I 2. Antithrombotics: -DVT/anticoagulation:Lovenox -antiplatelet therapy: Aspirin 325 mg daily and Plavix 75 mg daily x3 months then aspirin alone 3. Pain Management:Tylenol as needed  2/23- pt took Norco at home- willing to try tramadol 50 mg q6 hours prn ordered- if not effective, will do Norco 4. Mood:Provide emotional support -antipsychotic agents: N/A 5. Neuropsych: This patientiscapable of making decisions on hisown behalf. 6. Skin/Wound Care:Routine skin checks 7. Fluids/Electrolytes/Nutrition:Routine in and outs with follow-up chemistries  2/23- kidney function great and K+ is 3.7- con't to monitor  8. Hypertension. Lisinopril 20 mg daily. Monitor with increased mobility 9. Hyperlipidemia. Lipitor 10.Prostate cancerwith history of radioactive seed implant. Follow-up outpatient 11. Remote tobacco abuse. Continue to provide counseling 12. Constipation- LBM 5 days ago  2/23- will order Senokot-S 2 tabs qAM, and sorbitol and dulcolax suppository prn  2/24- had 2 BMs- feeling better 13. Neck tightness/myofascial pain  2/23- might benefit from trigger point injections if tramadol not  effective  2/24- slept better with his tempurpedic pillow- will con't to monitor     LOS: 2 days A FACE TO FACE EVALUATION WAS PERFORMED  Ruben Chavez 01/30/2020, 6:43 PM

## 2020-01-30 NOTE — Progress Notes (Signed)
Physical Therapy Session Note  Patient Details  Name: Ruben Chavez MRN: [id number] Date of Birth: 12-14-1950  Today's Date: 01/30/2020 PT Individual Time:  -      Short Term Goals: Week 1:  PT Short Term Goal 1 (Week 1): Pt will perform wc to/from bed/mat w/LRAD and cga, minimal cues for safety PT Short Term Goal 2 (Week 1): Pt will perform bed mobility w/supervision attending to LUE positioining PT Short Term Goal 3 (Week 1): Pt will ambulate in home environment greater than 166ft w/cga/LRAD  Skilled Therapeutic Interventions/Progress Updates:     Patient in w/c eating lunch upon PT arrival. Patient alert and agreeable to PT session. Patient denied pain during session. Noted increased L UE edema, discussed with Linna Hoff, PA, and he was in agreement with applying ACE wraps to improve edema control. Applied 2-4" ACE wraps to patient's L UE with total A. Educated patient on purpose of wrapping and wrapping technique and frequency while wraps were applied.   Therapeutic Activity: Transfers: Patient performed sit to/from stand x4 and stand pivot x2 with CGA-close supervision without an AD. Provided verbal cues for awareness and protection of L UE during transfers. Five times Sit to Stand Test (FTSS) Method: Use a straight back chair with a solid seat that is 16-18" high. Ask participant to sit on the chair with arms folded across their chest.   Instructions: "Stand up and sit down as quickly as possible 5 times, keeping your arms folded across your chest."   Measurement: Stop timing when the participant stands the 5th time.  TIME: ___32___ (in seconds)  Times > 13.6 seconds is associated with increased disability and morbidity (Guralnik, 2000) Times > 15 seconds is predictive of recurrent falls in healthy individuals aged 61 and older (Buatois, et al., 2008) Normal performance values in community dwelling individuals aged 63 and older (Bohannon, 2006): o 60-69 years: 11.4 seconds o 70-79  years: 12.6 seconds o 80-89 years: 14.8 seconds  MCID: ? 2.3 seconds for Vestibular Disorders Mariah Milling, 2006) Educated patient on results and interpretation following assessment.   Gait Training:  Patient ambulated 480 feet without an AD with CGA and min A x2 due to minor LOB with external distraction when stepping with L foot. Ambulated with decreased gait speed, decreased B step length, decreased L arm swing, intermittent variable foot placement on L (worse with distraction), downward head gaze, and decreased propulsion at terminal stance. Provided verbal cues for looking ahead and increased gait speed and step length to improve balance, timing, and propulsion during gait.  Wheelchair Mobility:  Patient propelled wheelchair 55 feet x2 with supervision for safety using B LEs and intermittent R UE. Provided verbal cues for avoiding obstacles an turning technique.  Neuromuscular Re-ed: Patient performed the following balance and motor planning activities during a functional activity: -sit to/from stand x5 without use of UE progressing from CGA to close supervision for safety. Provided multimodal cues for supporting L UE, forward weight shift, and controlled descent.   Patient in w/c in room with L UE elevated at end of session with breaks locked, seat belt alarm set, and all needs within reach.    Therapy Documentation Precautions:  Precautions Precautions: Fall Precaution Comments: L hemi UE>LE Restrictions Weight Bearing Restrictions: No    Therapy/Group: Individual Therapy  Ruben Chavez L Ruben Chavez PT, DPT  01/30/2020, 4:41 PM

## 2020-01-30 NOTE — Consult Note (Signed)
Neuropsychological Consultation   Patient:   Ruben Chavez   DOB:   1951-07-06  MR Number:  [id number]  Location:  [facility] [address] C[facility] [address] [place] [id number] [place] [address] Dept: [place]: [phone]           Date of Service:   01/30/2020  Start Time:   10 AM End Time:   11 AM  Provider/Observer:  Ilean Skill, Psy.D.       Clinical Neuropsychologist       Billing Code/Service: [id number]  Chief Complaint:    Ruben Chavez is a 69 year old male with a history of hyperlipidemia, hypertension, prostate cancer with radioactive seed implant, remote tobacco abuse.  The patient presented on 01/24/2020 with acute onset of left-sided weakness, facial droop and dysarthria.  Cranial CT scan showed subacute infarction right lateral temporal lobe.  No hemorrhage or mass-effect.  CT angiogram of head neck indicated embolic occlusion of the superior division M2 branch on the right.  MRI showed patchy and scattered acute right MCA territory infarct.  There was some noted blood in the right temporal lobe.  There also suggestion of small area of chronic right parietal lobe cortical encephalomalacia.  Small areas of the right superior frontal gyrus premotor cortical blood constriction and additional focus of right coronal radiata involvement.   Reason for Service:  The patient was referred for neuropsychological consultation due to cognitive and neurobehavioral dysfunction.  Below is the HPI for the current admission.  [id number] P [name] is a 69 y.o.right-handed male with history of hyperlipidemia, hypertension, prostate cancer radioactive seed implant, remote tobacco abuse. Per chart review lives with spouse independent prior to admission. Two-level home half bath on the main level and one-step to entry. Wife can assist as needed. Presented 01/24/2020 with acute onset of left-sided weakness facial droop and  dysarthria. Cranial CT scan showed subacute infarction right lateral temporal lobe. No hemorrhage or mass-effect. CT angiogram of head and neck embolic occlusion of the superior division M2 branch on the right. Patient did receive TPA. Underwent revascularization procedure per interventional radiology. Echocardiogram with ejection fraction of 65% without emboli. Venous Doppler studies negative. Neurology consulted maintain on aspirin and Plavix for CVA prophylaxis. Subcutaneous Lovenox for DVT prophylaxis. TEE showed ejection fraction 65% negative bubble study without thrombus. Loop recorder placed 01/28/2020. Patient did have occasional bouts of orthostatic hypotension blood pressure in the 60s on 01/26/2020 with conservative care and monitored when out of bed. Therapy evaluations completed and patient was admitted for a comprehensive rehab program.  Current Status:  While the patient was oriented today there was a lot of abrupt emotional changes and disruption.  The patient went from times of being very euthymic to sudden tearfulness.  The patient is aware that he has had a stroke and has suffered significant motor changes and motor deficits for his right side.  The patient is concerned about residual effects.  There is clearly significant frontal lobe and temporal lobe involvement in his neuro behavioral dysfunction.  The patient is having great difficulty with forming gestalt concepts and is impulsively trying to address issues in a generally inefficient and random manner.  He has made numerous phone calls to his wife and is impulsively jumping from subject to subject during therapies.  This is very likely directly related to right frontal involvement in his stroke.  The patient was likely a very gregarious individual and this impulsivity and difficulty with comprehension and  organizing his thoughts in a structured organized manner are likely being exacerbated by longstanding personality variables.   The patient is having difficulty inhibiting thoughts and impulses and difficulty maintaining focus and attention.  While he is aware of what is going on with him his ability to adapt and adjust to some of the symptoms are quite challenged and impaired.  The patient's intellectual capacity remains intact but his executive functioning is significantly impaired.  The patient is clearly showing some aspects of neglect syndrome that are not primarily visual neglect but information neglect and comprehension issues.  The patient also has significant involvement in the primary motor strip and likely issues related to his sensory motor interactions.  Behavioral Observation: [name]  presents as a 69 y.o.-year-old Right Caucasian Male who appeared his stated age. his dress was Appropriate and he was Well Groomed and his manners were Appropriate to the situation.  his participation was indicative of Appropriate, Inattentive and Redirectable behaviors.  There were any physical disabilities noted.  he displayed an appropriate level of cooperation and motivation.     Interactions:    Active Intrusive, Inattentive and Redirectable  Attention:   abnormal and attention span appeared shorter than expected for age  Memory:   abnormal; remote memory intact, recent memory impaired  Visuo-spatial:  not examined  Speech (Volume):  normal  Speech:   normal; normal  Thought Process:  Circumstantial, Tangential and Disorganized  Though Content:  Rumination; not suicidal and not homicidal  Orientation:   person, place, time/date and situation  Judgment:   Poor  Planning:   Poor  Affect:    Excited and Labile  Mood:    Dysphoric  Insight:   Fair  Intelligence:   high  Medical History:   Past Medical History:  Diagnosis Date  . Elevated PSA   . Hyperlipidemia    pt says not anymore  . Hypertension   . Prostate cancer [facility]) 2013     Psychiatric History:  No prior psychiatric history  noted  Family Med/Psych History:  Family History  Problem Relation Age of Onset  . Pancreatic cancer Mother   . Heart disease Father   . Colon cancer Neg Hx     Impression/DX:  [name] is a 69 year old male with a history of hyperlipidemia, hypertension, prostate cancer with radioactive seed implant, remote tobacco abuse.  The patient presented on 01/24/2020 with acute onset of left-sided weakness, facial droop and dysarthria.  Cranial CT scan showed subacute infarction right lateral temporal lobe.  No hemorrhage or mass-effect.  CT angiogram of head neck indicated embolic occlusion of the superior division M2 branch on the right.  MRI showed patchy and scattered acute right MCA territory infarct.  There was some noted blood in the right temporal lobe.  There also suggestion of small area of chronic right parietal lobe cortical encephalomalacia.  Small areas of the right superior frontal gyrus premotor cortical blood constriction and additional focus of right coronal radiata involvement.  While the patient was oriented today there was a lot of abrupt emotional changes and disruption.  The patient went from times of being very euthymic to sudden tearfulness.  The patient is aware that he has had a stroke and has suffered significant motor changes and motor deficits for his right side.  The patient is concerned about residual effects.  There is clearly significant frontal lobe and temporal lobe involvement in his neuro behavioral dysfunction.  The patient is having great difficulty with  forming gestalt concepts and is impulsively trying to address issues in a generally inefficient and random manner.  He has made numerous phone calls to his wife and is impulsively jumping from subject to subject during therapies.  This is very likely directly related to right frontal involvement in his stroke.  The patient was likely a very gregarious individual and this impulsivity and difficulty with  comprehension and organizing his thoughts in a structured organized manner are likely being exacerbated by longstanding personality variables.  The patient is having difficulty inhibiting thoughts and impulses and difficulty maintaining focus and attention.  While he is aware of what is going on with him his ability to adapt and adjust to some of the symptoms are quite challenged and impaired.  The patient's intellectual capacity remains intact but his executive functioning is significantly impaired.  The patient is clearly showing some aspects of neglect syndrome that are not primarily visual neglect but information neglect and comprehension issues.  The patient also has significant involvement in the primary motor strip and likely issues related to his sensory motor interactions.  Disposition/Plan:  I will follow-up with patient next week. The patient's level of executive functioning deficits and  And difficulty with self monitoring will pose a challenge with theraputic interventions.  He will need regular direction and redirection.  His mood is positive most of the time but is aware for the most part of his deficits but unable to use that information to help adjust during the moment.  Regular cueing and redirection will be needed.  Diagnosis:    Right middle cerebral artery stroke [facility]) - Plan: Ambulatory referral to Neurology         Electronically Signed   _______________________ Ilean Skill, Psy.D.

## 2020-01-30 NOTE — Progress Notes (Signed)
Speech Language Pathology Daily Session Note  Patient Details  Name: Ruben Chavez MRN: [id number] Date of Birth: 04/17/1951  Today's Date: 01/30/2020 SLP Individual Time: 1130-1200 SLP Individual Time Calculation (min): 30 min  Short Term Goals: Week 1: SLP Short Term Goal 1 (Week 1): Patient will demonstrate sustained attention to functional tasks for 45 minutes with Min verbal cues for redirection. SLP Short Term Goal 2 (Week 1): Patient will demonstrate functional problem solving for mildly complex but familiar tasks with Min A verbal cues. SLP Short Term Goal 3 (Week 1): Patient will self-monitor and correct errors during functional tasks with Mod A verbal cues. SLP Short Term Goal 4 (Week 1): Patient will utilize speech intelligibility strategies at the conversation level with supervision verbal cues.  Skilled Therapeutic Interventions: Skilled treatment session focused on cognitive goals. SLP facilitated session by providing Mod A verbal and visual cues for patient to attend to the left side of his body/enviornment during functional tasks. SLP also facilitated session by providing Min A verbal cues for recall of his current medications and their functions in preparation of a medication organization task. Patient was 100% intelligible throughout the conversation with Min verbal cues for topic maintenance and attention to task. Patient left upright in wheelchair with alarm on and all needs within reach. Continue with current plan of care.      Pain No/Denies Pain   Therapy/Group: Individual Therapy  Janai Brannigan 01/30/2020, 2:19 PM

## 2020-01-30 NOTE — Care Management (Signed)
Inpatient [facility] Individual Statement of Services  Patient Name:  Ruben Chavez  Date:  01/30/2020  Welcome to the [facility].  Our goal is to provide you with an individualized program based on your diagnosis and situation, designed to meet your specific needs.  With this comprehensive rehabilitation program, you will be expected to participate in at least 3 hours of rehabilitation therapies Monday-Friday, with modified therapy programming on the weekends.  Your rehabilitation program will include the following services:  Physical Therapy (PT), Occupational Therapy (OT), Speech Therapy (ST), 24 hour per day rehabilitation nursing, Therapeutic Recreaction (TR), Psychology, Neuropsychology, Case Management (Social Worker), Rehabilitation Medicine, Nutrition Services, Pharmacy Services and Other  Weekly team conferences will be held on Tuesdays to discuss your progress.  Your Social Worker will talk with you frequently to get your input and to update you on team discussions.  Team conferences with you and your family in attendance may also be held.  Expected length of stay: 10-14 days   Overall anticipated outcome: Minimal Assistance to Supervision  Depending on your progress and recovery, your program may change. Your Social Worker will coordinate services and will keep you informed of any changes. Your Social Worker's name and contact numbers are listed  below.  The following services may also be recommended but are not provided by the [facility] will be made to provide these services after discharge if needed.  Arrangements include referral to agencies that provide these services.  Your insurance has been verified to be:  [employer]   Your primary doctor is:  Dorise Hiss   Pertinent information will be shared with your doctor and your insurance company.  Social Worker:  Loralee Pacas, LCSWA  Information discussed with and copy given to patient by: Rana Snare, 01/30/2020, 10:06 AM

## 2020-01-31 ENCOUNTER — Ambulatory Visit (HOSPITAL_COMMUNITY): Payer: PPO | Attending: Physical Medicine and Rehabilitation

## 2020-01-31 ENCOUNTER — Inpatient Hospital Stay (HOSPITAL_COMMUNITY): Payer: PPO | Admitting: Speech Pathology

## 2020-01-31 ENCOUNTER — Inpatient Hospital Stay (HOSPITAL_COMMUNITY): Payer: PPO

## 2020-01-31 DIAGNOSIS — M7989 Other specified soft tissue disorders: Secondary | ICD-10-CM | POA: Diagnosis not present

## 2020-01-31 MED ORDER — HYDROCORTISONE 1 % EX LOTN
TOPICAL_LOTION | Freq: Three times a day (TID) | CUTANEOUS | Status: DC
  Filled 2020-01-31: qty 118

## 2020-01-31 MED ORDER — HYDROCORTISONE 1 % EX CREA
TOPICAL_CREAM | Freq: Three times a day (TID) | CUTANEOUS | Status: DC
  Filled 2020-01-31: qty 28

## 2020-01-31 NOTE — Progress Notes (Signed)
[facility] PHYSICAL MEDICINE & REHABILITATION PROGRESS NOTE   Subjective/Complaints:  Pt reports LUE more swollen than R- much worse than had been- was wrapped with ACE wrap yesterday- didn't appear to reduce swelling to pt at all.  Pt also notes a new rash on back as well as a different rash between thighs/inner thighs.     ROS: denies CP, SOB, N/V/C/D- going regularly- notes rash doesn't itch in either case .  Objective:   No results found. Recent Labs    01/29/20 0601  WBC 5.8  HGB 10.2*  HCT 30.3*  PLT 204   Recent Labs    01/29/20 0601  NA 139  K 3.7  CL 107  CO2 23  GLUCOSE 103*  BUN 12  CREATININE 0.88  CALCIUM 8.5*    Intake/Output Summary (Last 24 hours) at 01/31/2020 1134 Last data filed at 01/31/2020 0900 Gross per 24 hour  Intake 912 ml  Output -  Net 912 ml     Physical Exam: Vital Signs Blood pressure (!) 144/70, pulse 67, temperature 98.3 F (36.8 C), resp. rate 20, height 5\' 11"  (1.803 m), weight 82.3 kg, SpO2 99 %.  Physical Exam Constitutional: wake, alert, laying on supine with L arm elevated on pillow; NAD  HENT:  Head:Normocephalic.  Eyes: no nystagmus seen Cardiovascular: RRR  Respiratory: CTA B/L- loop recorder shown by small incision covered with dressing C/D/I on L chest [id number], NT, ND, (+)BS Musculoskeletal:  General: Normal range of motion.  Cervical back:tight musculature in upper traps, levators and scalenes as well as rhomboids B/L Neurological: Pt is alert and oriented. Fair insight and awareness. Left central 7. Speech is dysarthric. RUE 5/5. LUE 2-3/5 prox to trace distally. BLE 4+ to 5/5. No focal sensory findings.  Skin: Skin iswarm. L arm is esp swollen- esp hand, fingers and wrist- 4+- nonpitting- warmer than R side;  Rash- scattered spots- that are red/open? On back- like scratched pimples- not itching; also fine reticular rash that looks different than back on inner thighs- no signs of scratching.    Psychiatric:appropriate, joking some   Assessment/Plan: 1. Functional deficits secondary to R MCA stroke with L hemiparesis which require 3+ hours per day of interdisciplinary therapy in a comprehensive inpatient rehab setting.  Physiatrist is providing close team supervision and 24 hour management of active medical problems listed below.  Physiatrist and rehab team continue to assess barriers to discharge/monitor patient progress toward functional and medical goals  Care Tool:  Bathing  Bathing activity did not occur: Refused Body parts bathed by patient: Chest, Left arm, Abdomen, Face, Front perineal area, Right upper leg, Left upper leg, Right lower leg, Left lower leg   Body parts bathed by helper: Buttocks, Right arm     Bathing assist Assist Level: Minimal Assistance - Patient > 75%     Upper Body Dressing/Undressing Upper body dressing Upper body dressing/undressing activity did not occur (including orthotics): Refused What is the patient wearing?: Pull over shirt    Upper body assist Assist Level: Moderate Assistance - Patient 50 - 74%    Lower Body Dressing/Undressing Lower body dressing    Lower body dressing activity did not occur: Refused What is the patient wearing?: Pants, Underwear/pull up     Lower body assist Assist for lower body dressing: Moderate Assistance - Patient 50 - 74%     Toileting Toileting Toileting Activity did not occur (Clothing management and hygiene only): N/A (no void or bm)  Toileting assist Assist for toileting:  Moderate Assistance - Patient 50 - 74%     Transfers Chair/bed transfer  Transfers assist     Chair/bed transfer assist level: Minimal Assistance - Patient > 75%     Locomotion Ambulation   Ambulation assist      Assist level: Minimal Assistance - Patient > 75% Assistive device: No Device Max distance: 100   Walk 10 feet activity   Assist     Assist level: Minimal Assistance - Patient >  75% Assistive device: No Device   Walk 50 feet activity   Assist    Assist level: Minimal Assistance - Patient > 75% Assistive device: No Device    Walk 150 feet activity   Assist Walk 150 feet activity did not occur: Safety/medical concerns(distance not tolerated today)         Walk 10 feet on uneven surface  activity   Assist     Assist level: Minimal Assistance - Patient > 75% Assistive device: Hand held assist(pt unsafe without UE support)   Wheelchair     Assist Will patient use wheelchair at discharge?: No             Wheelchair 50 feet with 2 turns activity    Assist            Wheelchair 150 feet activity     Assist          Blood pressure (!) 144/70, pulse 67, temperature 98.3 F (36.8 C), resp. rate 20, height 5\' 11"  (1.803 m), weight 82.3 kg, SpO2 99 %.  Medical Problem List and Plan: 1.Left side weakness with dysarthria and facial droopsecondary to right MCA infarction.Status post loop recorder placement -patient may shower -ELOS/Goals: 10-14 days, mod I 2. Antithrombotics: -DVT/anticoagulation:Lovenox -antiplatelet therapy: Aspirin 325 mg daily and Plavix 75 mg daily x3 months then aspirin alone 3. Pain Management:Tylenol as needed  2/23- pt took Norco at home- willing to try tramadol 50 mg q6 hours prn ordered- if not effective, will do Norco  2/25- less back pain 4. Mood:Provide emotional support -antipsychotic agents: N/A 5. Neuropsych: This patientiscapable of making decisions on hisown behalf. 6. Skin/Wound Care:Routine skin checks 7. Fluids/Electrolytes/Nutrition:Routine in and outs with follow-up chemistries  2/23- kidney function great and K+ is 3.7- con't to monitor  2/25- check tomorrow  8. Hypertension. Lisinopril 20 mg daily. Monitor with increased mobility  2/25- BP running 0000000 systolic- con't regimen 9. Hyperlipidemia.  Lipitor 10.Prostate cancerwith history of radioactive seed implant. Follow-up outpatient 11. Remote tobacco abuse. Continue to provide counseling 12. Constipation- LBM 5 days ago  2/23- will order Senokot-S 2 tabs qAM, and sorbitol and dulcolax suppository prn  2/24- had 2 BMs- feeling better 13. Neck tightness/myofascial pain  2/23- might benefit from trigger point injections if tramadol not effective  2/24- slept better with his tempurpedic pillow- will con't to monitor 14/ Rashes- will try cortisone lotion for both and see how it helps BID; 15. LUE edema  2/25- concerned about DVT_ get Dopplers of LUE today and monitor Sx's.      LOS: 3 days A FACE TO FACE EVALUATION WAS PERFORMED  Akacia Boltz 01/31/2020, 11:34 AM

## 2020-01-31 NOTE — Progress Notes (Signed)
Physical Therapy Session Note  Patient Details  Name: Ruben Chavez MRN: [id number] Date of Birth: 1951-04-18  Today's Date: 01/31/2020 PT Individual Time: [id number] PT Individual Time Calculation (min): 42 min   Short Term Goals: Week 1:  PT Short Term Goal 1 (Week 1): Pt will perform wc to/from bed/mat w/LRAD and cga, minimal cues for safety PT Short Term Goal 2 (Week 1): Pt will perform bed mobility w/supervision attending to LUE positioining PT Short Term Goal 3 (Week 1): Pt will ambulate in home environment greater than 164ft w/cga/LRAD  Skilled Therapeutic Interventions/Progress Updates:     Patient in w/c in room reaching down to pick up his lap tray upon PT arrival. Patient alert and agreeable to PT session. Patient denied pain during session. Educated patient on calling for assistance to pick things up of the floor for safety. Patient stated understanding. Patient asked to discuss his deficits base on neuro-anatomy. Provided general education about the location of his stroke and corresponding deficits. Patient very appreciative.   Therapeutic Activity: Transfers: Patient performed sit to/from stand x8 with CGA-close supervision without an AD. Provided verbal cues for controlled descent for safety.  Gait Training:  Patient ambulated ~130 feet x2 without and AD with CGA and min A x1 due to LOB to the L. Ambulated with decreased gait speed, decreased step length, intermittent variable L foot placement, decreased L arm swing, mild forward trunk lean, and downward head gaze. Provided verbal cues for erect posture, looking ahead, and increased gait speed to promote increased stride length and balance with ambulation.  Neuromuscular Re-ed: Patient performed the following the Functional Gait Assessment without an AD and close supervision-CGA for safety (see details below): Patient presents with decreased balance with dynamic gait due to a score of 17/30 on the Functional Gait Assessment.  Score <19/30 indicated increased fall risk with dynamic gait activities.   Patient in w/c in room at end of session with breaks locked, seat blet alarm set, and all needs within reach.    Therapy Documentation Precautions:  Precautions Precautions: Fall Precaution Comments: L hemi UE>LE Restrictions Weight Bearing Restrictions: No  Balance: Balance Balance Assessed: Yes Standardized Balance Assessment Standardized Balance Assessment: Functional Gait Assessment Functional Gait  Assessment Gait Level Surface: Walks 20 ft in less than 7 sec but greater than 5.5 sec, uses assistive device, slower speed, mild gait deviations, or deviates 6-10 in outside of the 12 in walkway width. Change in Gait Speed: Able to change speed, demonstrates mild gait deviations, deviates 6-10 in outside of the 12 in walkway width, or no gait deviations, unable to achieve a major change in velocity, or uses a change in velocity, or uses an assistive device. Gait with Horizontal Head Turns: Performs head turns smoothly with slight change in gait velocity (eg, minor disruption to smooth gait path), deviates 6-10 in outside 12 in walkway width, or uses an assistive device. Gait with Vertical Head Turns: Performs task with slight change in gait velocity (eg, minor disruption to smooth gait path), deviates 6 - 10 in outside 12 in walkway width or uses assistive device Gait and Pivot Turn: Turns slowly, requires verbal cueing, or requires several small steps to catch balance following turn and stop Step Over Obstacle: Is able to step over one shoe box (4.5 in total height) without changing gait speed. No evidence of imbalance. Gait with Narrow Base of Support: Ambulates 4-7 steps. Gait with Eyes Closed: Walks 20 ft, slow speed, abnormal gait pattern, evidence for imbalance,  deviates 10-15 in outside 12 in walkway width. Requires more than 9 sec to ambulate 20 ft. Ambulating Backwards: Walks 20 ft, uses assistive device,  slower speed, mild gait deviations, deviates 6-10 in outside 12 in walkway width. Steps: Alternating feet, must use rail. Total Score: 17/30    Therapy/Group: Individual Therapy  Jacky Dross L Krysten Veronica PT, DPT  01/31/2020, 4:44 PM

## 2020-01-31 NOTE — Progress Notes (Signed)
Occupational Therapy Session Note  Patient Details  Name: Ruben Chavez MRN: [id number] Date of Birth: Jun 30, 1951  Today's Date: 01/31/2020 OT Individual Time: 1045-1200 OT Individual Time Calculation (min): 75 min    Short Term Goals: Week 1:  OT Short Term Goal 1 (Week 1): Pt will require no more than min cueing for use of hemi technique during dressing OT Short Term Goal 2 (Week 1): Pt will don shirt with min A OT Short Term Goal 3 (Week 1): Pt will don pants with min A OT Short Term Goal 4 (Week 1): Pt will transfer to toilet with LRAD with CGA  Skilled Therapeutic Interventions/Progress Updates:    1;1. Pt received in w/c agreeable to shower but requesting to brush teeth. Pt very internally and externally distracted requiring up to mod cueing to attend to tasks throughout session and manage LUE. Pt stands with VC for placing LUE in WB position to groom at sink while grooming with CGA. Pt completes ambulatory transfer with MIN A to sit on TTB in shower and bathe using HOH A of LUE to wash RUE with total A to grasp washcloth. Pt completes. Dressing at sit to stand level with MIN A for LB and S for UB and total cueing for attention/follwing hemi techniques. VC to use 1 handed sock donning technique. Exited session with pt seated in w/c, exit alarm on and call light tin reach  Therapy Documentation Precautions:  Precautions Precautions: Fall Precaution Comments: L hemi UE>LE Restrictions Weight Bearing Restrictions: No General:   Vital Signs:  Pain: Pain Assessment Pain Scale: 0-10 Pain Score: 0-No pain ADL:   Vision   Perception    Praxis   Exercises:   Other Treatments:     Therapy/Group: Individual Therapy  Tonny Branch 01/31/2020, 10:44 AM

## 2020-01-31 NOTE — IPOC Note (Signed)
Overall Plan of Care [facility]) Patient Details Name: Ruben Chavez MRN: [id number] DOB: 03-23-1951  Admitting Diagnosis: Right middle cerebral artery stroke [facility])  Hospital Problems: Principal Problem:   Right middle cerebral artery stroke [facility]) Active Problems:   Cognitive and neurobehavioral dysfunction     Functional Problem List: Nursing Bladder, Bowel, Edema, Endurance, Medication Management, Motor, Pain, Perception, Safety, Skin Integrity  PT Balance, Perception, Behavior, Safety, Sensory, Edema, Endurance, Skin Integrity, Motor  OT Balance, Perception, Safety, Cognition, Sensory, Edema, Endurance, Motor, Pain  SLP    TR         Basic ADL's: OT Bathing, Dressing, Toileting     Advanced  ADL's: OT       Transfers: PT Bed Mobility, Bed to Chair, Car, Manufacturing systems engineer, Metallurgist: PT Ambulation, Emergency planning/management officer, Stairs     Additional Impairments: OT Fuctional Use of Upper Extremity  SLP Communication, Social Cognition expression Problem Solving, Awareness, Attention  TR      Anticipated Outcomes Item Anticipated Outcome  Self Feeding no goal set  Swallowing      Basic self-care  (S)  Toileting  (S)   Bathroom Transfers (S)  Bowel/Bladder  remain continent and maintain regular patter of emptying  Transfers  supervision  Locomotion  supervision  Communication  Mod I  Cognition  Supervision  Pain  less than 3  Safety/Judgment  free of falls, skin breakdown and infection while on rehab   Therapy Plan: PT Intensity: Minimum of 1-2 x/day ,45 to 90 minutes PT Frequency: 5 out of 7 days PT Duration Estimated Length of Stay: 14 days OT Intensity: Minimum of 1-2 x/day, 45 to 90 minutes OT Frequency: 5 out of 7 days OT Duration/Estimated Length of Stay: 10-14 days SLP Intensity: Minumum of 1-2 x/day, 30 to 90 minutes SLP Frequency: 3 to 5 out of 7 days SLP Duration/Estimated Length of Stay: 14-18 days   Due to the current  state of emergency, patients may not be receiving their 3-hours of Medicare-mandated therapy.   Team Interventions: Nursing Interventions Patient/Family Education, Bladder Management, Bowel Management, Skin Care/Wound Management, Discharge Planning, Psychosocial Support  PT interventions Ambulation/gait training, DME/adaptive equipment instruction, Neuromuscular re-education, Stair training, UE/LE Strength taining/ROM, Wheelchair propulsion/positioning, Training and development officer, Discharge planning, Therapeutic Activities, UE/LE Coordination activities, Cognitive remediation/compensation, Functional mobility training, Patient/family education, Therapeutic Exercise  OT Interventions Balance/vestibular training, Discharge planning, Functional electrical stimulation, Pain management, Self Care/advanced ADL retraining, Therapeutic Activities, UE/LE Coordination activities, Therapeutic Exercise, Visual/perceptual remediation/compensation, Patient/family education, Functional mobility training, Disease mangement/prevention, Cognitive remediation/compensation, Community reintegration, Wheelchair propulsion/positioning, UE/LE Strength taining/ROM, Splinting/orthotics, Psychosocial support, Neuromuscular re-education, DME/adaptive equipment instruction  SLP Interventions Cognitive remediation/compensation, Internal/external aids, Speech/Language facilitation, English as a second language teacher, Environmental controls, Therapeutic Activities, Functional tasks, Patient/family education  TR Interventions    SW/CM Interventions Discharge Planning, Psychosocial Support, Patient/Family Education   Barriers to Discharge MD  Medical stability, Home enviroment access/loayout, Wound care, Lack of/limited family support, Behavior and LUE edema- check for DVT  Nursing      PT Behavior, Lack of/limited family support Per the evaluting therapy notes, patient will require 24/7 supervision at d/c due to decreased safety awareness  OT       SLP      SW Lack of/limited family support, Decreased caregiver support Pt reports wife to have hip replacement on 3/2 and will have limited support from children   Team Discharge Planning: Destination: PT-Home ,OT- Home , SLP-Home Projected Follow-up: PT-Home health PT, OT-  Home health  OT, SLP-24 hour supervision/assistance, Outpatient SLP Projected Equipment Needs: PT-To be determined, OT- To be determined, SLP-None recommended by SLP Equipment Details: PT- , OT-  Patient/family involved in discharge planning: PT- Patient,  OT-Patient, SLP-Patient  MD ELOS: 14 days Medical Rehab Prognosis:  Good Assessment: Pt had R MCA stroke with L hemiparesis- as well as impaired ability to recognize deficits- also has new LUE edema which is getting worse- will check Dopplers- also has 2 different rashes- ordered cortisone lotion .   Goals Supervision- to work on endurance, transfers, gait, ADLs and motor recovery as well as cognition.     See Team Conference Notes for weekly updates to the plan of care

## 2020-01-31 NOTE — Progress Notes (Signed)
Upper extremity venous has been completed.   Preliminary results in CV Proc.   Abram Sander 01/31/2020 3:11 PM

## 2020-01-31 NOTE — Progress Notes (Signed)
Speech Language Pathology Daily Session Note  Patient Details  Name: [name] MRN: [id number] Date of Birth: 1951/01/20  Today's Date: 01/31/2020 SLP Individual Time: 0830-0925 SLP Individual Time Calculation (min): 55 min  Short Term Goals: Week 1: SLP Short Term Goal 1 (Week 1): Patient will demonstrate sustained attention to functional tasks for 45 minutes with Min verbal cues for redirection. SLP Short Term Goal 2 (Week 1): Patient will demonstrate functional problem solving for mildly complex but familiar tasks with Min A verbal cues. SLP Short Term Goal 3 (Week 1): Patient will self-monitor and correct errors during functional tasks with Mod A verbal cues. SLP Short Term Goal 4 (Week 1): Patient will utilize speech intelligibility strategies at the conversation level with supervision verbal cues.  Skilled Therapeutic Interventions: Skilled treatment session focused on cognitive goals. Upon arrival, patient was shirtless while sitting EOB. SLP facilitated session by providing extra time and Min A verbal cues for problem solving while donning his shirt. Patient was transferred to the wheelchair and performed basic self-care tasks at the sink with Mod verbal cues to attend to his LUE. Patient requested to eat his breakfast and required Min verbal cues to scan to locate items on his breakfast tray in his left field of environment and Max verbal cues to self-monitor and correct anterior spillage while talking with a full oral cavity. Patient required Mod verbal cues to sustain attention to task and for topic maintenance. Patient left upright in the wheelchair with alarm on and all needs within reach. Continue with current plan of care.      Pain No/Denies Pain   Therapy/Group: Individual Therapy  Bao Coreas 01/31/2020, 3:03 PM

## 2020-01-31 NOTE — Progress Notes (Signed)
Patient would like information about getting back on Wellbutrin, I told patient I would let the doctor know so she could address in the morning. [place]

## 2020-01-31 NOTE — Telephone Encounter (Signed)
PA Case: [id number], Status: Approved, Coverage Starts on: 12/05/2019 12:00:00 AM, Coverage Ends on: 12/04/2020 12:00:00 AM.

## 2020-02-01 ENCOUNTER — Inpatient Hospital Stay (HOSPITAL_COMMUNITY): Payer: PPO

## 2020-02-01 ENCOUNTER — Inpatient Hospital Stay (HOSPITAL_COMMUNITY): Payer: PPO | Admitting: Speech Pathology

## 2020-02-01 MED ORDER — BUPROPION HCL 75 MG PO TABS
75.0000 mg | ORAL_TABLET | Freq: Two times a day (BID) | ORAL | Status: DC
  Administered 2020-02-01 – 2020-02-13 (×25): 75 mg via ORAL
  Filled 2020-02-01 (×26): qty 1

## 2020-02-01 NOTE — Progress Notes (Signed)
Occupational Therapy Session Note  Patient Details  Name: IRVAN VAILLANCOURT MRN: [id number] Date of Birth: 1951/09/30  Today's Date: 02/01/2020 OT Individual Time: [id number] OT Individual Time Calculation (min): 54 min    Short Term Goals: Week 1:  OT Short Term Goal 1 (Week 1): Pt will require no more than min cueing for use of hemi technique during dressing OT Short Term Goal 2 (Week 1): Pt will don shirt with min A OT Short Term Goal 3 (Week 1): Pt will don pants with min A OT Short Term Goal 4 (Week 1): Pt will transfer to toilet with LRAD with CGA  Skilled Therapeutic Interventions/Progress Updates:    1:1. Pt received in w/c reporting wanting to shower. Pt ambulates into bathroom with CGA and VC for LUE management on RW prior to transfer and attention to limb throughout tasks. Pt completes seated bathing with NMR of LUE to wash/dry RUE via [place] A for facilitation. Pt dresses sit to stand with CGA and min question cues to recall hemi techniques for LB dressing. Pt continues to require VC for pacing of donning shirt using hemi techniques as pt easily distracted and moving on to other steps before finishing the first. OT wraps LUE with coban from distal digits to mid humerus for edema management. Edu re use of estim for wrist activation and wash mit for future showers to improve use of LUE to bathe more body parts  Therapy Documentation Precautions:  Precautions Precautions: Fall Precaution Comments: L hemi UE>LE Restrictions Weight Bearing Restrictions: No General:   Vital Signs: Therapy Vitals Temp: 97.8 F (36.6 C) Pulse Rate: 61 Resp: 18 BP: (!) 144/59 Patient Position (if appropriate): Sitting Oxygen Therapy SpO2: 100 % O2 Device: Room Air Pain:   ADL:   Vision   Perception    Praxis   Exercises:   Other Treatments:     Therapy/Group: Individual Therapy  Tonny Branch 02/01/2020, 6:21 PM

## 2020-02-01 NOTE — Progress Notes (Signed)
[place] PHYSICAL MEDICINE & REHABILITATION PROGRESS NOTE   Subjective/Complaints:  Pt asking to get back on Wellbutrin- has taken prior.   Elevating LUE on pillow except this AM let hang while eating breakfast- evident by exam.   Rashes- inner thighs is better- still not itchy.   Wondering about Koban with PT for LUE- she will either wrap it herself or d/w OT>    [id number] CP, SOB, does admit to some depression, denies N/V/C/D .  Objective:   VAS [place] UPPER EXTREMITY VENOUS DUPLEX  Result Date: 01/31/2020 UPPER VENOUS STUDY  Indications: Swelling Comparison Study: no prior Performing Technologist: Abram Sander RVS  Examination Guidelines: A complete evaluation includes B-mode imaging, spectral Doppler, color Doppler, and power Doppler as needed of all accessible portions of each vessel. Bilateral testing is considered an integral part of a complete examination. Limited examinations for reoccurring indications may be performed as noted.  Left Findings: +----------+------------+---------+-----------+----------+-------+ LEFT      CompressiblePhasicitySpontaneousPropertiesSummary +----------+------------+---------+-----------+----------+-------+ IJV           Full       Yes       Yes                      +----------+------------+---------+-----------+----------+-------+ Subclavian    Full       Yes       Yes                      +----------+------------+---------+-----------+----------+-------+ Axillary      Full       Yes       Yes                      +----------+------------+---------+-----------+----------+-------+ Brachial      Full       Yes       Yes                      +----------+------------+---------+-----------+----------+-------+ Radial        Full                                          +----------+------------+---------+-----------+----------+-------+ Ulnar         Full                                           +----------+------------+---------+-----------+----------+-------+ Cephalic      Full                                          +----------+------------+---------+-----------+----------+-------+ Basilic       Full                                          +----------+------------+---------+-----------+----------+-------+  Summary:  Left: No evidence of deep vein thrombosis in the upper extremity. No evidence of superficial vein thrombosis in the upper extremity.  *See table(s) above for measurements and observations.  Diagnosing physician: Monica Martinez MD Electronically signed by Monica Martinez MD on 01/31/2020 at 7:04:07 PM.    Final  No results for input(s): WBC, HGB, HCT, PLT in the last 72 hours. No results for input(s): NA, K, CL, CO2, GLUCOSE, BUN, CREATININE, CALCIUM in the last 72 hours.  Intake/Output Summary (Last 24 hours) at 02/01/2020 0939 Last data filed at 02/01/2020 0905 Gross per 24 hour  Intake 903 ml  Output --  Net 903 ml     Physical Exam: Vital Signs Blood pressure 137/69, pulse 61, temperature 97.9 F (36.6 C), resp. rate 18, height 5\' 11"  (1.803 m), weight 82.3 kg, SpO2 99 %.  Physical Exam Constitutional: awake, alert, sitting EOB, PT in room; LLE hanging in between legs, NAD  HENT:  Head:Normocephalic.  Eyes: conjugate gaze Cardiovascular: RRR Respiratory: normal rate, no signs of accessory muscle use- loop recorder seen under bandage on L chest C/D/I [id number], NT, ND, (+)BS Musculoskeletal:  General: Normal range of motion.  Cervical back:tight musculature in upper traps, levators and scalenes as well as rhomboids B/L Neurological: Pt is alert and oriented. Fair insight and awareness. Left central 7. Speech is dysarthric. RUE 5/5. LUE 2-3/5 prox to trace distally. BLE 4+ to 5/5. No focal sensory findings.  Skin: L hand and fingers most swollen- like LUE has been hanging down (it was) less swelling in Lforearm and upper  arm -rash on back looks bette-r less erythema/less red Rash on inner thighs-less red reticular fine rash Psychiatric:joking a lot- but appears sad   Assessment/Plan: 1. Functional deficits secondary to R MCA stroke with L hemiparesis which require 3+ hours per day of interdisciplinary therapy in a comprehensive inpatient rehab setting.  Physiatrist is providing close team supervision and 24 hour management of active medical problems listed below.  Physiatrist and rehab team continue to assess barriers to discharge/monitor patient progress toward functional and medical goals  Care Tool:  Bathing  Bathing activity did not occur: Refused Body parts bathed by patient: Chest, Left arm, Abdomen, Face, Front perineal area, Right upper leg, Left upper leg, Right lower leg, Left lower leg   Body parts bathed by helper: Buttocks, Right arm     Bathing assist Assist Level: Minimal Assistance - Patient > 75%     Upper Body Dressing/Undressing Upper body dressing Upper body dressing/undressing activity did not occur (including orthotics): Refused What is the patient wearing?: Pull over shirt    Upper body assist Assist Level: Moderate Assistance - Patient 50 - 74%    Lower Body Dressing/Undressing Lower body dressing    Lower body dressing activity did not occur: Refused What is the patient wearing?: Underwear/pull up, Pants     Lower body assist Assist for lower body dressing: Moderate Assistance - Patient 50 - 74%     Toileting Toileting Toileting Activity did not occur (Clothing management and hygiene only): N/A (no void or bm)  Toileting assist Assist for toileting: Moderate Assistance - Patient 50 - 74%     Transfers Chair/bed transfer  Transfers assist     Chair/bed transfer assist level: Minimal Assistance - Patient > 75%     Locomotion Ambulation   Ambulation assist      Assist level: Minimal Assistance - Patient > 75% Assistive device: No Device Max  distance: 100   Walk 10 feet activity   Assist     Assist level: Minimal Assistance - Patient > 75% Assistive device: No Device   Walk 50 feet activity   Assist    Assist level: Minimal Assistance - Patient > 75% Assistive device: No Device    Walk 150 feet  activity   Assist Walk 150 feet activity did not occur: Safety/medical concerns(distance not tolerated today)         Walk 10 feet on uneven surface  activity   Assist     Assist level: Minimal Assistance - Patient > 75% Assistive device: Hand held assist(pt unsafe without UE support)   Wheelchair     Assist Will patient use wheelchair at discharge?: No             Wheelchair 50 feet with 2 turns activity    Assist            Wheelchair 150 feet activity     Assist          Blood pressure 137/69, pulse 61, temperature 97.9 F (36.6 C), resp. rate 18, height 5\' 11"  (1.803 m), weight 82.3 kg, SpO2 99 %.  Medical Problem List and Plan: 1.Left side weakness with dysarthria and facial droopsecondary to right MCA infarction.Status post loop recorder placement -patient may shower -ELOS/Goals: 10-14 days, mod I 2. Antithrombotics: -DVT/anticoagulation:Lovenox -antiplatelet therapy: Aspirin 325 mg daily and Plavix 75 mg daily x3 months then aspirin alone 3. Pain Management:Tylenol as needed  2/23- pt took Norco at home- willing to try tramadol 50 mg q6 hours prn ordered- if not effective, will do Norco  2/25- less back pain 4. Mood:Provide emotional support -antipsychotic agents: N/A 5. Neuropsych: This patientiscapable of making decisions on hisown behalf. 6. Skin/Wound Care:Routine skin checks 7. Fluids/Electrolytes/Nutrition:Routine in and outs with follow-up chemistries  2/23- kidney function great and K+ is 3.7- con't to monitor  2/25- check tomorrow  8. Hypertension. Lisinopril 20 mg daily. Monitor with  increased mobility  2/25- BP running 0000000 systolic- con't regimen 9. Hyperlipidemia. Lipitor 10.Prostate cancerwith history of radioactive seed implant. Follow-up outpatient 11. Remote tobacco abuse. Continue to provide counseling 12. Constipation- LBM 5 days ago  2/23- will order Senokot-S 2 tabs qAM, and sorbitol and dulcolax suppository prn  2/24- had 2 BMs- feeling better 13. Neck tightness/myofascial pain  2/23- might benefit from trigger point injections if tramadol not effective  2/24- slept better with his tempurpedic pillow- will con't to monitor  2/26- notes neck pain better- con't to monitor- can still do injections if needed 14/ Rashes- will try cortisone lotion for both and see how it helps BID;  2/26- helping both rashes- con't regimen 15. LUE edema  2/25- concerned about DVT_ get Dopplers of LUE today and monitor Sx's.   2/26- Dopplers (-)- will get Koban to wrap LUE- by OT or PT- esp hand/fingers and encouraged pt to not let arm dangle.  16. Depression  2/26- pt wants ot restart Wellbutrin- will start 75 mg BID- low dose, esp because (although he knows it's worked) can lower seizure threshold slightly- will monitor for seizures.       LOS: 4 days A FACE TO FACE EVALUATION WAS PERFORMED  Mae Cianci 02/01/2020, 9:39 AM

## 2020-02-01 NOTE — Progress Notes (Signed)
Speech Language Pathology Daily Session Note  Patient Details  Name: Ruben Chavez MRN: [id number] Date of Birth: [birth year]  Today's Date: 02/01/2020 SLP Individual Time: 1005-1045 SLP Individual Time Calculation (min): 40 min  Short Term Goals: Week 1: SLP Short Term Goal 1 (Week 1): Patient will demonstrate sustained attention to functional tasks for 45 minutes with Min verbal cues for redirection. SLP Short Term Goal 2 (Week 1): Patient will demonstrate functional problem solving for mildly complex but familiar tasks with Min A verbal cues. SLP Short Term Goal 3 (Week 1): Patient will self-monitor and correct errors during functional tasks with Mod A verbal cues. SLP Short Term Goal 4 (Week 1): Patient will utilize speech intelligibility strategies at the conversation level with supervision verbal cues.  Skilled Therapeutic Interventions: Skilled treatment session focused on cognitive goals. SLP facilitated session by providing Min A verbal cues for problem solving, error awareness and selective attention during a complex medication management task in which he had to organize a BID pill box. Patient left upright in wheelchair with alarm on and all needs within reach. Continue with current plan of care.      Pain No/Denies Pain   Therapy/Group: Individual Therapy  Isela Stantz 02/01/2020, 3:18 PM

## 2020-02-01 NOTE — Progress Notes (Signed)
Physical Therapy Session Note  Patient Details  Name: Ruben Chavez MRN: [id number] Date of Birth: Feb 06, 1951  Today's Date: 02/01/2020 PT Individual Time: [phone] and [id number]  PT Individual Time Calculation (min): 58 min and 45 min   Short Term Goals: Week 1:  PT Short Term Goal 1 (Week 1): Pt will perform wc to/from bed/mat w/LRAD and cga, minimal cues for safety PT Short Term Goal 2 (Week 1): Pt will perform bed mobility w/supervision attending to LUE positioining PT Short Term Goal 3 (Week 1): Pt will ambulate in home environment greater than 121ft w/cga/LRAD  Skilled Therapeutic Interventions/Progress Updates:     Session 1: Patient sitting EOB finishing breakfast upon PT arrival. Patient alert and agreeable to PT session. Patient denied pain during session. Dr. Dagoberto Ligas, MD rounded during session. Discussed UE swelling and management.   Therapeutic Activity: Transfers: Patient performed sit to/from stand x6 with CGA-close supervision. Provided verbal cues for reaching back to control descent for safety.  Gait Training:  Patient ambulated 80 feet x2 without an AD with CGA-close supervision. Ambulated with decreased gait speed, decreased step length, mild forward trunk lean, decreased L arm swing, and intermittent variable L foot placement. Provided verbal cues for erect posture and increased propulsion to improve step length.  Neuromuscular Re-ed: Patient performed the following standing balance and UE motor control activities: -standing balance at the sink with L UE support for UE activation x6 min as patient performed brushing his hair, brushing his teeth, and shaving with his R hand. Patient was able to manipulate all utensils with his R hand and steadying with his L hand while maintaining his balance with CGA progressing to close supervision -B UE progressing to L UE weight bearing while leaning onto an elevated mat table. Provided multimodal cues for L elbow extension and  scapular muscle activation -Quadruped rocking back and forth x10. Provided multimodal cues for L elbow extension and scapular muscle activation and equal weight bearing between hands. -Quadruped lifting R UE up for increased weightbearing on L UE. Provided multimodal cues for L elbow extension and scapular muscle activation and L weight shift versus backwards weight shift to increase L UE weight bearing -Seated lateral trunk leans onto L elbow and using L UE to push up. Provided multimodal cues for use of L UE versus trunk musculature.  Patient in w/c in room with L UE elevated on 2 pillows with a blanket folded under his hand for edema control at end of session with breaks locked, seat belt alarm set, and all needs within reach.   Session 2: Patient in w/c in room upon PT arrival. Patient alert and agreeable to PT session. Patient denied pain during session.  Therapeutic Activity: Transfers: Patient performed sit to/from stand x8 with supervision without an AD from the w/c, mat table, and standard chair with no arm rests. Provided verbal cues for controlled descent for safety x3. He performed floor transfers x2 with min A and heavy cues for technique/sequencing due to difficulty with motor planning and attention during the task. Provided demonstration of technique prior to trials.  Gait Training:  Patient ambulated 135 feet x2 without an AD, as above. Provided verbal cues for increased gait speed to promote increased step length, arm swing, and propulsion.  Neuromuscular Re-ed: Patient performed the following dynamic gait activities with CGA and intermittent min A for minor LOB without an AD: -Obstacle course including ambulating over a mat with 10lb weights underneath it for unlevel surfaces, stepping over 8 cones,  stepping up/down a 4" step x2, weaving between 8 cones, and ambulating over the mat with weights under it again. PT demonstrated sequence x1. First trial, the patient required heavy  cues for sequencing with poor recall of instructions. Second trial he performed with min cues.  -Same obstacle course with patient ambulating backwards over the mat with weights under it at the end. First trial patient required min cues for sequencing. Second trial he performed without cues.  -Standing balance while holding a medium sized ball with both hands, maintained pressure to hold the ball in his hands x1 min -Same obstacle course with backwards walking on mat while holding a the ball in his hands, focused on keeping pressure on the ball with his L hand.   Patient in w/c in room at end of session with breaks locked, seat belt alarm set, and all needs within reach.    Therapy Documentation Precautions:  Precautions Precautions: Fall Precaution Comments: L hemi UE>LE Restrictions Weight Bearing Restrictions: No    Therapy/Group: Individual Therapy  Aleshka Corney L Roi Jafari PT, DPT  02/01/2020, 12:25 PM

## 2020-02-02 ENCOUNTER — Inpatient Hospital Stay (HOSPITAL_COMMUNITY): Payer: PPO | Admitting: Physical Therapy

## 2020-02-02 ENCOUNTER — Inpatient Hospital Stay (HOSPITAL_COMMUNITY): Payer: PPO | Admitting: Speech Pathology

## 2020-02-02 ENCOUNTER — Inpatient Hospital Stay (HOSPITAL_COMMUNITY): Payer: PPO | Admitting: Occupational Therapy

## 2020-02-02 NOTE — Progress Notes (Signed)
[facility] PHYSICAL MEDICINE & REHABILITATION PROGRESS NOTE   Subjective/Complaints:  Discussed that his wellbutrin was resumed. Notes emotional lability at times and asked if that was due to his stroke location  ROS: Patient denies fever, rash, sore throat, blurred vision, nausea, vomiting, diarrhea, cough, shortness of breath or chest pain, joint or back pain, headache,  .  .  Objective:   VAS [place] UPPER EXTREMITY VENOUS DUPLEX  Result Date: 01/31/2020 UPPER VENOUS STUDY  Indications: Swelling Comparison Study: no prior Performing Technologist: Abram Sander RVS  Examination Guidelines: A complete evaluation includes B-mode imaging, spectral Doppler, color Doppler, and power Doppler as needed of all accessible portions of each vessel. Bilateral testing is considered an integral part of a complete examination. Limited examinations for reoccurring indications may be performed as noted.  Left Findings: +----------+------------+---------+-----------+----------+-------+ LEFT      CompressiblePhasicitySpontaneousPropertiesSummary +----------+------------+---------+-----------+----------+-------+ IJV           Full       Yes       Yes                      +----------+------------+---------+-----------+----------+-------+ Subclavian    Full       Yes       Yes                      +----------+------------+---------+-----------+----------+-------+ Axillary      Full       Yes       Yes                      +----------+------------+---------+-----------+----------+-------+ Brachial      Full       Yes       Yes                      +----------+------------+---------+-----------+----------+-------+ Radial        Full                                          +----------+------------+---------+-----------+----------+-------+ Ulnar         Full                                          +----------+------------+---------+-----------+----------+-------+ Cephalic       Full                                          +----------+------------+---------+-----------+----------+-------+ Basilic       Full                                          +----------+------------+---------+-----------+----------+-------+  Summary:  Left: No evidence of deep vein thrombosis in the upper extremity. No evidence of superficial vein thrombosis in the upper extremity.  *See table(s) above for measurements and observations.  Diagnosing physician: Monica Martinez MD Electronically signed by Monica Martinez MD on 01/31/2020 at 7:04:07 PM.    Final    No results for input(s): WBC, HGB, HCT, PLT in the last 72 hours. No results for input(s):  NA, K, CL, CO2, GLUCOSE, BUN, CREATININE, CALCIUM in the last 72 hours.  Intake/Output Summary (Last 24 hours) at 02/02/2020 1006 Last data filed at 02/02/2020 [id number] Gross per 24 hour  Intake 564 ml  Output --  Net 564 ml     Physical Exam: Vital Signs Blood pressure (!) 138/54, pulse 63, temperature 98.3 F (36.8 C), resp. rate 18, height 5\' 11"  (1.803 m), weight 82.3 kg, SpO2 99 %.  Physical Exam Constitutional: No distress . Vital signs reviewed. HEENT: EOMI, oral membranes moist Neck: supple Cardiovascular: RRR without murmur. No JVD    Respiratory/Chest: CTA Bilaterally without wheezes or rales. Normal effort. loop rec    GI/Abdomen: BS +, non-tender, non-distended Ext: no clubbing, cyanosis, or edema Musculoskeletal:  General: Normal range of motion.  Cervical back:tight musculature in upper traps, levators and scalenes as well as rhomboids B/L    LUE with coban wrap--swelling reduced Neurological: Pt is alert and oriented. Fair insight and awareness. Left central 7. Speech is dysarthric. RUE 5/5. LUE 2-3/5 prox to trace distally. BLE 4+ to 5/5. No focal sensory findings.  Skin: -rash on back looks bette-r less erythema/less red Rash on inner thighs-less red reticular fine rash--stable Psychiatric:  pleasant   Assessment/Plan: 1. Functional deficits secondary to R MCA stroke with L hemiparesis which require 3+ hours per day of interdisciplinary therapy in a comprehensive inpatient rehab setting.  Physiatrist is providing close team supervision and 24 hour management of active medical problems listed below.  Physiatrist and rehab team continue to assess barriers to discharge/monitor patient progress toward functional and medical goals  Care Tool:  Bathing  Bathing activity did not occur: Refused Body parts bathed by patient: Chest, Left arm, Abdomen, Face, Front perineal area, Right upper leg, Left upper leg, Right lower leg, Left lower leg   Body parts bathed by helper: Buttocks, Front perineal area     Bathing assist Assist Level: Minimal Assistance - Patient > 75%     Upper Body Dressing/Undressing Upper body dressing Upper body dressing/undressing activity did not occur (including orthotics): Refused What is the patient wearing?: Pull over shirt    Upper body assist Assist Level: Supervision/Verbal cueing    Lower Body Dressing/Undressing Lower body dressing    Lower body dressing activity did not occur: Refused What is the patient wearing?: Underwear/pull up, Pants     Lower body assist Assist for lower body dressing: Minimal Assistance - Patient > 75%     Toileting Toileting Toileting Activity did not occur (Clothing management and hygiene only): N/A (no void or bm)  Toileting assist Assist for toileting: Moderate Assistance - Patient 50 - 74%     Transfers Chair/bed transfer  Transfers assist     Chair/bed transfer assist level: Minimal Assistance - Patient > 75%     Locomotion Ambulation   Ambulation assist      Assist level: Contact Guard/Touching assist Assistive device: Walker-standard Max distance: 135'   Walk 10 feet activity   Assist     Assist level: Contact Guard/Touching assist Assistive device: No Device   Walk 50 feet  activity   Assist    Assist level: Contact Guard/Touching assist Assistive device: No Device    Walk 150 feet activity   Assist Walk 150 feet activity did not occur: Safety/medical concerns(distance not tolerated today)         Walk 10 feet on uneven surface  activity   Assist     Assist level: Contact Guard/Touching assist Assistive device:  Hand held assist(pt unsafe without UE support)   Wheelchair     Assist Will patient use wheelchair at discharge?: No   Wheelchair activity did not occur: N/A         Wheelchair 50 feet with 2 turns activity    Assist            Wheelchair 150 feet activity     Assist          Blood pressure (!) 138/54, pulse 63, temperature 98.3 F (36.8 C), resp. rate 18, height 5\' 11"  (1.803 m), weight 82.3 kg, SpO2 99 %.  Medical Problem List and Plan: 1.Left side weakness with dysarthria and facial droopsecondary to right MCA infarction.Status post loop recorder placement -patient may shower -ELOS/Goals: 10-14 days, mod I 2. Antithrombotics: -DVT/anticoagulation:Lovenox -antiplatelet therapy: Aspirin 325 mg daily and Plavix 75 mg daily x3 months then aspirin alone 3. Pain Management:Tylenol as needed  2/23- pt took Norco at home- willing to try tramadol 50 mg q6 hours prn ordered- if not effective, will do Norco  2/25- less back pain 4. Mood:Provide emotional support -antipsychotic agents: N/A 5. Neuropsych: This patientiscapable of making decisions on hisown behalf. 6. Skin/Wound Care:Routine skin checks 7. Fluids/Electrolytes/Nutrition:Routine in and outs with follow-up chemistries  2/23- kidney function great and K+ is 3.7- con't to monitor  2/25- check tomorrow  8. Hypertension. Lisinopril 20 mg daily. Monitor with increased mobility  2/25- BP running 0000000 systolic- con't regimen  [id number] bp controlled 9. Hyperlipidemia.  Lipitor 10.Prostate cancerwith history of radioactive seed implant. Follow-up outpatient 11. Remote tobacco abuse. Continue to provide counseling 12. Constipation- LBM 5 days ago  2/23- will order Senokot-S 2 tabs qAM, and sorbitol and dulcolax suppository prn  2/24- had 2 BMs- feeling better 13. Neck tightness/myofascial pain  2/23- might benefit from trigger point injections if tramadol not effective  2/24- slept better with his tempurpedic pillow- will con't to monitor  2/26- notes neck pain better- con't to monitor- can still do injections if needed 14/ Rashes- will try cortisone lotion for both and see how it helps BID;  2/26- helping both rashes- con't regimen 15. LUE edema  2/25- concerned about DVT_ get Dopplers of LUE today and monitor Sx's.   2/26- Dopplers (-)- will get Koban to wrap LUE- by OT or PT- esp hand/fingers and encouraged pt to not let arm dangle.  16. Depression  2/26- pt wants ot restart Wellbutrin- will start 75 mg BID- low dose, esp because (although he knows it's worked) can lower seizure threshold slightly- will monitor for seizures.   2/27- happy to be on wellbutrin. Discussed the fact that his right cortical infarct may lead to emotional lability/PBA. Monitor for now. Might be a candidate for nuedexta if symptoms are ongoing.       LOS: 5 days A FACE TO FACE EVALUATION WAS PERFORMED  Meredith Staggers 02/02/2020, 10:06 AM

## 2020-02-02 NOTE — Progress Notes (Signed)
Physical Therapy Session Note  Patient Details  Name: Ruben Chavez MRN: [id number] Date of Birth: [birth year]  Today's Date: 02/02/2020 PT Individual Time: 1630-1741 PT Individual Time Calculation (min): 71 min   Short Term Goals: Week 1:  PT Short Term Goal 1 (Week 1): Pt will perform wc to/from bed/mat w/LRAD and cga, minimal cues for safety PT Short Term Goal 2 (Week 1): Pt will perform bed mobility w/supervision attending to LUE positioining PT Short Term Goal 3 (Week 1): Pt will ambulate in home environment greater than 169ft w/cga/LRAD  Skilled Therapeutic Interventions/Progress Updates:   Gregary Signs, OT, recommended trying GivMohr sling during ambulation to provide improved L UE support. Pt received sitting in w/c and agreeable to therapy session. Therapist donned sling to L UE to wear throughout session (only size small available at the time). Gait training ~243ft, no AD, with CGA for steadying - pt demonstrates improved L UE support during gait while wearing sling compared to without it - demonstrates no significant LOB or unsteadiness while walking despite turns and sudden stopping and changing of directions. Therapist donned maxi-sky harness for safety during the following higher level gait challenges:  - lateral side stepping  - backwards walking  - lateral side stepping while tapping cones - backwards cone weaving - required max cuing with visual demonstration to motor plan task - [place]/karaoke walking - pt required max cuing with visual demonstration of sequencing demonstrating increased difficulty motor planning stepping towards the R when L leg stepping forward/backwards around R Doffed harness in standing with CGA for steadying. Gait to ortho gym ~164ft, no AD, with CGA for steadying. Ambulated ~35ft up/down ramp, ~20ft x2 over mulch, and up/down curb step all without UE support and CGA for steadying. Therapist donned ACE wrap on pt's L hand to provide grip support on arm bike  and pt performed B UE rotations 31minutes then L UE only forward rotations 75minutes and backwards rotations 24minutes with therapist also providing manual facilitation throughout for wrist stability all against level 1 resistance.Walked into stairwell, no AD, with CGA for steadying throughout. Ascended/descended 12steps using minimal R UE support on handrail and CGA/min assist for steadying. Ambulated ~48ft back to room with CGA for steadying and pt left in w/c with L UE supported on lap tray (GivMohr sling doffed), seat belt alarm on, and meal tray set-up.     Therapy Documentation Precautions:  Precautions Precautions: Fall Precaution Comments: L hemi UE>LE Restrictions Weight Bearing Restrictions: No  Pain: No reports of pain throughout session.   Therapy/Group: Individual Therapy  Tawana Scale, PT, DPT 02/02/2020, 3:36 PM

## 2020-02-02 NOTE — Progress Notes (Signed)
Speech Language Pathology Daily Session Note  Patient Details  Name: Ruben Chavez MRN: [id number] Date of Birth: May 26, 1951  Today's Date: 02/02/2020 SLP Individual Time: 0930-1000 SLP Individual Time Calculation (min): 30 min  Short Term Goals: Week 1: SLP Short Term Goal 1 (Week 1): Patient will demonstrate sustained attention to functional tasks for 45 minutes with Min verbal cues for redirection. SLP Short Term Goal 2 (Week 1): Patient will demonstrate functional problem solving for mildly complex but familiar tasks with Min A verbal cues. SLP Short Term Goal 3 (Week 1): Patient will self-monitor and correct errors during functional tasks with Mod A verbal cues. SLP Short Term Goal 4 (Week 1): Patient will utilize speech intelligibility strategies at the conversation level with supervision verbal cues.  Skilled Therapeutic Interventions:  Pt was seen for skilled ST targeting cognitive goals.  Session began late as pt was on the phone with a customer service representative from his bank in order to reset his online bank account password.  Therapist facilitated the session with a semi-complex deductive reasoning task to address goals for problem solving.  Pt was able to complete task with min assist verbal cues for task organization, attention to detail, and error awareness.  Pt reports that task was harder than he anticipated.  Pt was left in wheelchair with call bell within reach.  Continue per current plan of care.    Pain Pain Assessment Pain Scale: 0-10 Pain Score: 0-No pain  Therapy/Group: Individual Therapy  Ruben Chavez, Ruben Chavez 02/02/2020, 10:01 AM

## 2020-02-02 NOTE — Progress Notes (Signed)
Occupational Therapy Session Note  Patient Details  Name: Ruben Chavez MRN: [id number] Date of Birth: [birth year]  Today's Date: 02/02/2020 OT Individual Time: 1300-1400 OT Individual Time Calculation (min): 60 min    Short Term Goals: Week 1:  OT Short Term Goal 1 (Week 1): Pt will require no more than min cueing for use of hemi technique during dressing OT Short Term Goal 2 (Week 1): Pt will don shirt with min A OT Short Term Goal 3 (Week 1): Pt will don pants with min A OT Short Term Goal 4 (Week 1): Pt will transfer to toilet with LRAD with CGA      Skilled Therapeutic Interventions/Progress Updates:    Pt received in w/c with LUE on 2 pillows on top of lap tray. This put his L shoulder in an awkward position and recommended he only use a towel on top of lap tray.  Pt requesting to shower.  Pt had coban wrap applied yesterday from finger tips up to mid forearm. With coban, he continued to have a large amount of fluid pooled over dorsum of hand.  Removed coban for shower and noted some areas of skin irritation under his wrist.  Recommended waiting to reapply until Monday to let skin heal.   Pt has excellent motion of shoulder developing along with 75% of elbow activation. Pt completed all ADL transfers and ambulation in bathroom room with CGA.  CGA overall with LB self care and A to wash R arm. After self care completion, focused on reducing edema in hand.  Set up ice bath for pt to put hand in from a standing positon. Held L hand in water for 20 seconds and pt stated "yeah it is a little cold"  and the water was freezing.  Had pt feel the water with his R hand and he said, "oh yes that is super cold". Educated pt on precautions with decreased sensation, especially temperatures. Did ice bath a 2nd 20 seconds and then had pt sit in w/c.  Manual therapy of retrograde massage and significant amount of fluid reduced in hand where the dimples of his knuckles were visible.  Educated pt on how  to do self massage, positioning,etc.. Obtained a Give Mohr sling for PT to try with pt this afternoon as he is ambulating a great deal and needs support to reduce edema in hand.  Pt resting in wc with chair alarm and all needs met.    Therapy Documentation Precautions:  Precautions Precautions: Fall Precaution Comments: L hemi UE>LE Restrictions Weight Bearing Restrictions: No    Vital Signs: Therapy Vitals Temp: 97.7 F (36.5 C) Pulse Rate: 69 Resp: 20 BP: 138/63 Patient Position (if appropriate): Sitting Oxygen Therapy SpO2: 99 % O2 Device: Room Air Pain: Pain Assessment Pain Score: 0-No pain   Therapy/Group: Individual Therapy  Hoagland 02/02/2020, 5:13 PM

## 2020-02-03 ENCOUNTER — Inpatient Hospital Stay (HOSPITAL_COMMUNITY): Payer: PPO | Admitting: Speech Pathology

## 2020-02-03 NOTE — Progress Notes (Signed)
[place] PHYSICAL MEDICINE & REHABILITATION PROGRESS NOTE   Subjective/Complaints:  Pt up in bathroom when I came in. Walked back to bed with CGA. No new complaints.   ROS: Patient denies fever, rash, sore throat, blurred vision, nausea, vomiting, diarrhea, cough, shortness of breath or chest pain, joint or back pain, headache, or mood change.    Objective:   No results found. No results for input(s): WBC, HGB, HCT, PLT in the last 72 hours. No results for input(s): NA, K, CL, CO2, GLUCOSE, BUN, CREATININE, CALCIUM in the last 72 hours.  Intake/Output Summary (Last 24 hours) at 02/03/2020 1049 Last data filed at 02/02/2020 1840 Gross per 24 hour  Intake 660 ml  Output --  Net 660 ml     Physical Exam: Vital Signs Blood pressure (!) 144/73, pulse 68, temperature 98.2 F (36.8 C), temperature source Oral, resp. rate 19, height 5\' 11"  (1.803 m), weight 82.3 kg, SpO2 100 %.  Physical Exam Constitutional: No distress . Vital signs reviewed. HEENT: EOMI, oral membranes moist Neck: supple Cardiovascular: RRR without murmur. No JVD    Respiratory/Chest: CTA Bilaterally without wheezes or rales. Normal effort    GI/Abdomen: BS +, non-tender, non-distended Ext: no clubbing, cyanosis. LUE with 1+ edema Musculoskeletal:  General: Normal range of motion.  Cervical back:tight musculature in upper traps, levators and scalenes as well as rhomboids B/L      Neurological: Pt is alert and oriented. Fair insight and awareness. Left central 7. Speech is still dysarthric. RUE 5/5. LUE 2-3/5 prox to trace distally. BLE 4+ to 5/5. No focal sensory findings. Good standing balance.  Skin: -rash on back looks bette-r less erythema/less red Rash on inner thighs-less red reticular fine rash--not visualized today Psychiatric: bery pleasant   Assessment/Plan: 1. Functional deficits secondary to R MCA stroke with L hemiparesis which require 3+ hours per day of interdisciplinary therapy  in a comprehensive inpatient rehab setting.  Physiatrist is providing close team supervision and 24 hour management of active medical problems listed below.  Physiatrist and rehab team continue to assess barriers to discharge/monitor patient progress toward functional and medical goals  Care Tool:  Bathing  Bathing activity did not occur: Refused Body parts bathed by patient: Chest, Left arm, Abdomen, Face, Front perineal area, Right upper leg, Left upper leg, Right lower leg, Left lower leg, Buttocks   Body parts bathed by helper: Right arm     Bathing assist Assist Level: Minimal Assistance - Patient > 75%     Upper Body Dressing/Undressing Upper body dressing Upper body dressing/undressing activity did not occur (including orthotics): Refused What is the patient wearing?: Pull over shirt    Upper body assist Assist Level: Contact Guard/Touching assist    Lower Body Dressing/Undressing Lower body dressing    Lower body dressing activity did not occur: Refused What is the patient wearing?: Underwear/pull up, Pants     Lower body assist Assist for lower body dressing: Contact Guard/Touching assist     Toileting Toileting Toileting Activity did not occur (Clothing management and hygiene only): N/A (no void or bm)  Toileting assist Assist for toileting: Moderate Assistance - Patient 50 - 74%     Transfers Chair/bed transfer  Transfers assist     Chair/bed transfer assist level: Contact Guard/Touching assist     Locomotion Ambulation   Ambulation assist      Assist level: Contact Guard/Touching assist Assistive device: No Device Max distance: 234ft   Walk 10 feet activity   Assist  Assist level: Contact Guard/Touching assist Assistive device: No Device   Walk 50 feet activity   Assist    Assist level: Contact Guard/Touching assist Assistive device: No Device    Walk 150 feet activity   Assist Walk 150 feet activity did not occur:  Safety/medical concerns(distance not tolerated today)  Assist level: Contact Guard/Touching assist Assistive device: No Device    Walk 10 feet on uneven surface  activity   Assist     Assist level: Contact Guard/Touching assist Assistive device: Other (comment)(no device)   Wheelchair     Assist Will patient use wheelchair at discharge?: No   Wheelchair activity did not occur: N/A         Wheelchair 50 feet with 2 turns activity    Assist            Wheelchair 150 feet activity     Assist          Blood pressure (!) 144/73, pulse 68, temperature 98.2 F (36.8 C), temperature source Oral, resp. rate 19, height 5\' 11"  (1.803 m), weight 82.3 kg, SpO2 100 %.  Medical Problem List and Plan: 1.Left side weakness with dysarthria and facial droopsecondary to right MCA infarction.Status post loop recorder placement -patient may shower -ELOS/Goals: 10-14 days, mod I 2. Antithrombotics: -DVT/anticoagulation:Lovenox -antiplatelet therapy: Aspirin 325 mg daily and Plavix 75 mg daily x3 months then aspirin alone 3. Pain Management:Tylenol as needed  2/23- pt took Norco at home- willing to try tramadol 50 mg q6 hours prn ordered- if not effective, will do Norco  2/25- less back pain 4. Mood:Provide emotional support -antipsychotic agents: N/A 5. Neuropsych: This patientiscapable of making decisions on hisown behalf. 6. Skin/Wound Care:Routine skin checks 7. Fluids/Electrolytes/Nutrition:Routine in and outs with follow-up chemistries  2/23- kidney function great and K+ is 3.7- con't to monitor  2/25- check tomorrow  8. Hypertension. Lisinopril 20 mg daily. Monitor with increased mobility  2/25- BP running 0000000 systolic- con't regimen  [id number] bp controlled 9. Hyperlipidemia. Lipitor 10.Prostate cancerwith history of radioactive seed implant. Follow-up outpatient 11. Remote tobacco abuse.  Continue to provide counseling 12. Constipation- LBM 5 days ago  2/23- will order Senokot-S 2 tabs qAM, and sorbitol and dulcolax suppository prn  2/24- had 2 BMs- feeling better 13. Neck tightness/myofascial pain  2/23- might benefit from trigger point injections if tramadol not effective  2/24- slept better with his tempurpedic pillow- will con't to monitor  2/26- notes neck pain better- con't to monitor- can still do injections if needed 14/ Rashes- will try cortisone lotion for both and see how it helps BID;  2/26- helping both rashes- con't regimen 15. LUE edema  2/25- concerned about DVT_ get Dopplers of LUE today and monitor Sx's.   2/26- Dopplers (-)- will get Koban to wrap LUE- by OT or PT- esp hand/fingers and encouraged pt to not let arm dangle.  2/28 reviewed massage and edema mgt techniques  16. Depression  2/26- pt wants ot restart Wellbutrin- will start 75 mg BID- low dose, esp because (although he knows it's worked) can lower seizure threshold slightly- will monitor for seizures.   2/27- happy to be on wellbutrin. Discussed the fact that his right cortical infarct may lead to emotional lability/PBA. Monitor for now. Might be a candidate for nuedexta if symptoms are ongoing or progressive.       LOS: 6 days A FACE TO FACE EVALUATION WAS PERFORMED  Meredith Staggers 02/03/2020, 10:49 AM

## 2020-02-03 NOTE — Plan of Care (Signed)
  Problem: Consults Goal: RH STROKE PATIENT EDUCATION Description: See Patient Education module for education specifics  Outcome: Progressing   Problem: RH BOWEL ELIMINATION Goal: RH STG MANAGE BOWEL W/MEDICATION W/ASSISTANCE Description: STG Manage Bowel with Medication with Assistance. Min Outcome: Progressing   Problem: RH BLADDER ELIMINATION Goal: RH STG MANAGE BLADDER WITH ASSISTANCE Description: STG Manage Bladder With Assistance. Min assist Outcome: Progressing   Problem: RH SKIN INTEGRITY Goal: RH STG SKIN FREE OF INFECTION/BREAKDOWN Description: Free of breakdown and infection with Min assist Outcome: Progressing   Problem: RH SAFETY Goal: RH STG ADHERE TO SAFETY PRECAUTIONS W/ASSISTANCE/DEVICE Description: STG Adhere to Safety Precautions With Assistance/Device. min Outcome: Progressing   Problem: RH PAIN MANAGEMENT Goal: RH STG PAIN MANAGED AT OR BELOW PT'S PAIN GOAL Description: Less than 3 Outcome: Progressing   Problem: RH KNOWLEDGE DEFICIT Goal: RH STG INCREASE KNOWLEDGE OF STROKE PROPHYLAXIS Description: Patient/spouse will be able to verbalize measures to prevent stroke including medication and lifestyle management with cues/handouts Outcome: Progressing

## 2020-02-03 NOTE — Progress Notes (Signed)
Speech Language Pathology Daily Session Note  Patient Details  Name: Ruben Chavez MRN: [id number] Date of Birth: 05-19-1951  Today's Date: 02/03/2020 SLP Individual Time: [id number] SLP Individual Time Calculation (min): 57 min  Short Term Goals: Week 1: SLP Short Term Goal 1 (Week 1): Patient will demonstrate sustained attention to functional tasks for 45 minutes with Min verbal cues for redirection. SLP Short Term Goal 2 (Week 1): Patient will demonstrate functional problem solving for mildly complex but familiar tasks with Min A verbal cues. SLP Short Term Goal 3 (Week 1): Patient will self-monitor and correct errors during functional tasks with Mod A verbal cues. SLP Short Term Goal 4 (Week 1): Patient will utilize speech intelligibility strategies at the conversation level with supervision verbal cues.  Skilled Therapeutic Interventions: Pt was seen for skilled ST targeting cognitive goals. SLP facilitated session with 2 deductive reasoning logic puzzles, ranging from semi-complex to complex in nature. Pt initially required Min A verbal cues for organization, however he carried organizational strategies such as a check off system and "slow down" for accuracy to better organize and attend to details, requiring only Supervision A verbal cues for said skills throughout the second task. Although pt was slightly delayed in initiation of task at first and mildly distracted by his cell phone, once engaged in tasks, he sustained attention throughout session with Supervision A verbal cues for redirection. Pt left sitting in wheelchair with seatbelt alarm in place and needs within reach. Continue per current plan of care.        Pain Pain Assessment Pain Scale: Faces Pain Score: 5  Faces Pain Scale: Hurts a little bit Pain Type: Acute pain Pain Location: Arm Pain Orientation: Left Pain Descriptors / Indicators: Aching Pain Onset: Gradual Pain Intervention(s): RN made aware Multiple Pain  Sites: No  Therapy/Group: Individual Therapy  Arbutus Leas 02/03/2020, 11:06 AM

## 2020-02-04 ENCOUNTER — Inpatient Hospital Stay (HOSPITAL_COMMUNITY): Payer: PPO

## 2020-02-04 ENCOUNTER — Inpatient Hospital Stay (HOSPITAL_COMMUNITY): Payer: PPO | Admitting: Occupational Therapy

## 2020-02-04 ENCOUNTER — Inpatient Hospital Stay (HOSPITAL_COMMUNITY): Payer: PPO | Admitting: Speech Pathology

## 2020-02-04 LAB — CREATININE, SERUM
Creatinine, Ser: 0.95 mg/dL (ref 0.61–1.24)
GFR calc Af Amer: >60 mL/min (ref >60)
GFR calc non Af Amer: >60 mL/min (ref >60)

## 2020-02-04 NOTE — Progress Notes (Signed)
Physical Therapy Session Note  Patient Details  Name: Ruben Chavez MRN: [id number] Date of Birth: 05/10/1951  Today's Date: 02/04/2020 PT Individual Time: 1005-1015 PT Individual Time Calculation (min): 10 min   Short Term Goals: Week 1:  PT Short Term Goal 1 (Week 1): Pt will perform wc to/from bed/mat w/LRAD and cga, minimal cues for safety PT Short Term Goal 2 (Week 1): Pt will perform bed mobility w/supervision attending to LUE positioining PT Short Term Goal 3 (Week 1): Pt will ambulate in home environment greater than 191ft w/cga/LRAD  Skilled Therapeutic Interventions/Progress Updates:     Patient in w/c in room upon PT arrival. Patient alert and agreeable to PT session. Patient denied pain during session. Had patient don mask at beginning of session. He reported that he could perform elbow flexion/extension and demonstrated for PT x5 through full range with decreased control moving with gravity for extension. He was wearing sandals and stated that he wears them more often than tennis shoes. He ambulated in the room with the sandals with CGA-close supervision with no signs of increased fall risk or new gait deviations. Cleared patient to ambulate in the room with sandals donned. Educated that he would still need to wear tennis shoes during therapy for dynamic gait activities. Patient in agreement.   Patient then reported that he had just heard from his daughter that she had tested positive for COVID. His daughter lives with him and his wife and his wife has been visiting him in the hospital and CIR. RN entered the room and informed the patient that he would be tested for COVID today due to potential exposure and that he would be on hold from therapies until the results came back. Ended session at this time. Patient missed 50 min of skilled PT due to medical hold for COVID testing.    Patient in w/c with RN in room at end of session with breaks locked, seat belt alarm set, and all needs  within reach.    Therapy Documentation Precautions:  Precautions Precautions: Fall Precaution Comments: L hemi UE>LE Restrictions Weight Bearing Restrictions: No General: PT Amount of Missed Time (min): 50 Minutes PT Missed Treatment Reason: MD hold (Comment)(COVID testing)    Therapy/Group: Individual Therapy  Tyla Burgner L Charles Andringa PT, DPT  02/04/2020, 12:59 PM

## 2020-02-04 NOTE — Progress Notes (Signed)
Occupational Therapy Session Note  Patient Details  Name: Ruben Chavez MRN: [id number] Date of Birth: 1951-03-31  Today's Date: 02/04/2020 OT Individual Time: [id number] OT Individual Time Calculation (min): 62 min    Short Term Goals: Week 1:  OT Short Term Goal 1 (Week 1): Pt will require no more than min cueing for use of hemi technique during dressing OT Short Term Goal 2 (Week 1): Pt will don shirt with min A OT Short Term Goal 3 (Week 1): Pt will don pants with min A OT Short Term Goal 4 (Week 1): Pt will transfer to toilet with LRAD with CGA  Skilled Therapeutic Interventions/Progress Updates:    Patient seated in w/c, pleasant, cooperative and aware of needs.  Ambulation without AD to/from w/c, shower bench with CGA.  Shower completed seated on shower bench with CS.  Dressing completed seated with set up, CS in stance for CM.  Grooming tasks completed in stance at sink with CS, occ assist for set up due to limited grasp with left hand - using left UE as support in stance and attempts to use as gross assist.  Reviewed postioning and posture to promote more normalized movement t/o left side.  Ambulation to/from therapy gym with CGA.  Completed right shoulder AROM activities with set up.  kinesiotape applied to left hand and forearm for edema management - reviewed wear, care and precautions.  Noted full elbow flexion and extension against gravity, full pronation and supination.  Trace wrist.  Patient returned to w/c at close of session. Seat belt alarm set and call bell in reach.    Therapy Documentation Precautions:  Precautions Precautions: Fall Precaution Comments: L hemi UE>LE Restrictions Weight Bearing Restrictions: No General:   Vital Signs:  Therapy/Group: Individual Therapy  Carlos Levering 02/04/2020, 7:48 AM

## 2020-02-04 NOTE — Progress Notes (Signed)
During medication pass this morning, this writer over heard wife state that their daughter tested positive for covid. She was tested on Friday of this past week due to not feeling well and just got er results. The patients wide stated that she was going at 11am to get tested and would call him back with the results. Writer informed charge nurse of the possible exposure, precautions put in place, medical team and infection control aware. Patient aware of staying in room and wearing a mask when staff enter. Patient verbalized understanding.

## 2020-02-04 NOTE — Progress Notes (Signed)
Speech Language Pathology Daily Session Note  Patient Details  Name: Ruben Chavez MRN: [id number] Date of Birth: 12/15/1950  Today's Date: 02/04/2020 SLP Individual Time: [id number] SLP Individual Time Calculation (min): 55 min  Short Term Goals: Week 1: SLP Short Term Goal 1 (Week 1): Patient will demonstrate sustained attention to functional tasks for 45 minutes with Min verbal cues for redirection. SLP Short Term Goal 2 (Week 1): Patient will demonstrate functional problem solving for mildly complex but familiar tasks with Min A verbal cues. SLP Short Term Goal 3 (Week 1): Patient will self-monitor and correct errors during functional tasks with Mod A verbal cues. SLP Short Term Goal 4 (Week 1): Patient will utilize speech intelligibility strategies at the conversation level with supervision verbal cues.  Skilled Therapeutic Interventions: Skilled treatment session focused on cognitive goals. SLP facilitated session by providing Min A verbal cues for error awareness during a complex money management task. Patient demonstrated selective attention to task for ~45 minutes with overall supervision level verbal cues for redirection. Patient left upright in wheelchair with alarm on and all needs within reach. Continue with current plan of care.      Pain Pain Assessment Pain Scale: 0-10 Pain Score: 0-No pain  Therapy/Group: Individual Therapy  Traivon Morrical 02/04/2020, 2:05 PM

## 2020-02-04 NOTE — Progress Notes (Signed)
[place] PHYSICAL MEDICINE & REHABILITATION PROGRESS NOTE   Subjective/Complaints:  Pt reports taking shower- has new Elbow ROM at biceps and triceps per pt and OT.   Was called AFTER rounds that daughter Lanelle Bal has COVID and was staying with pt's wife who has been visiting regularly- pt's wife also supposed to get joint replacement this week, so went to get tested.     ROS: pt denies SOB, CP, N/V/C/D; also denies cough, sneezing, resp Sx's.   Objective:   No results found. No results for input(s): WBC, HGB, HCT, PLT in the last 72 hours. Recent Labs    02/04/20 0628  CREATININE 0.95    Intake/Output Summary (Last 24 hours) at 02/04/2020 1104 Last data filed at 02/04/2020 [id number] Gross per 24 hour  Intake 922 ml  Output --  Net 922 ml     Physical Exam: Vital Signs Blood pressure 125/69, pulse 71, temperature 98.4 F (36.9 C), resp. rate 19, height 5\' 11"  (1.803 m), weight 82.3 kg, SpO2 97 %.  Physical Exam Constitutional: No distress . Vital signs reviewed. Pt sitting in shower- OT at side- doing supervision currently.appropriate, NAD  HEENT: EOMI, oral membranes moist Neck: supple Cardiovascular: no JVD-  Respiratory/Chest: no resp distress; no accessory muscle use   GI/Abdomen: nondistended; soft Ext: no clubbing, cyanosis- L hand 1-2+ edema- less sausage fingers, but still significantly edematous. D/w OT_ will wrap with K-tape, not coban.  Musculoskeletal:  General: Normal range of motion.  Cervical back:tight musculature in upper traps, levators and scalenes as well as rhomboids B/L     -pt has new L biceps, triceps movement - at least 2/5 Neurological: Pt alert and oriented- asking appropriate questions.  Skin: -rash on back looks bette-r less erythema/less red Rash on inner thighs-less red reticular fine rash--not visualized today Psychiatric: pleasant/appropriate.    Assessment/Plan: 1. Functional deficits secondary to R MCA stroke with L  hemiparesis which require 3+ hours per day of interdisciplinary therapy in a comprehensive inpatient rehab setting.  Physiatrist is providing close team supervision and 24 hour management of active medical problems listed below.  Physiatrist and rehab team continue to assess barriers to discharge/monitor patient progress toward functional and medical goals  Care Tool:  Bathing  Bathing activity did not occur: Refused Body parts bathed by patient: Chest, Left arm, Abdomen, Face, Front perineal area, Right upper leg, Left upper leg, Right lower leg, Left lower leg, Buttocks   Body parts bathed by helper: Front perineal area, Buttocks     Bathing assist Assist Level: Minimal Assistance - Patient > 75%     Upper Body Dressing/Undressing Upper body dressing Upper body dressing/undressing activity did not occur (including orthotics): Refused What is the patient wearing?: Pull over shirt    Upper body assist Assist Level: Contact Guard/Touching assist    Lower Body Dressing/Undressing Lower body dressing    Lower body dressing activity did not occur: Refused What is the patient wearing?: Underwear/pull up, Pants     Lower body assist Assist for lower body dressing: Contact Guard/Touching assist     Toileting Toileting Toileting Activity did not occur (Clothing management and hygiene only): N/A (no void or bm)  Toileting assist Assist for toileting: Moderate Assistance - Patient 50 - 74%     Transfers Chair/bed transfer  Transfers assist     Chair/bed transfer assist level: Contact Guard/Touching assist     Locomotion Ambulation   Ambulation assist      Assist level: Contact Guard/Touching assist Assistive  device: Walker-rolling Max distance: 228ft   Walk 10 feet activity   Assist     Assist level: Contact Guard/Touching assist Assistive device: No Device   Walk 50 feet activity   Assist    Assist level: Contact Guard/Touching assist Assistive  device: No Device    Walk 150 feet activity   Assist Walk 150 feet activity did not occur: Safety/medical concerns(distance not tolerated today)  Assist level: Contact Guard/Touching assist Assistive device: No Device    Walk 10 feet on uneven surface  activity   Assist     Assist level: Contact Guard/Touching assist Assistive device: Other (comment)(no device)   Wheelchair     Assist Will patient use wheelchair at discharge?: No   Wheelchair activity did not occur: N/A         Wheelchair 50 feet with 2 turns activity    Assist            Wheelchair 150 feet activity     Assist          Blood pressure 125/69, pulse 71, temperature 98.4 F (36.9 C), resp. rate 19, height 5\' 11"  (1.803 m), weight 82.3 kg, SpO2 97 %.  Medical Problem List and Plan: 1.Left side weakness with dysarthria and facial droopsecondary to right MCA infarction.Status post loop recorder placement  3/1- pt has new L biceps and triceps movement- over weekend- will con't OT for UB strengthening.  -patient may shower -ELOS/Goals: 10-14 days, mod I 2. Antithrombotics: -DVT/anticoagulation:Lovenox -antiplatelet therapy: Aspirin 325 mg daily and Plavix 75 mg daily x3 months then aspirin alone 3. Pain Management:Tylenol as needed  2/23- pt took Norco at home- willing to try tramadol 50 mg q6 hours prn ordered- if not effective, will do Norco  2/25- less back pain 4. Mood:Provide emotional support -antipsychotic agents: N/A 5. Neuropsych: This patientiscapable of making decisions on hisown behalf. 6. Skin/Wound Care:Routine skin checks 7. Fluids/Electrolytes/Nutrition:Routine in and outs with follow-up chemistries  2/23- kidney function great and K+ is 3.7- con't to monitor  2/25- check tomorrow   3/1- labs in AM- Cr 0.95 today.  8. Hypertension. Lisinopril 20 mg daily. Monitor with increased mobility  2/25- BP  running 0000000 systolic- con't regimen  [id number] bp controlled  3/1- BP running 120s/70s- well controlled- con't regimen.  9. Hyperlipidemia. Lipitor 10.Prostate cancerwith history of radioactive seed implant. Follow-up outpatient 11. Remote tobacco abuse. Continue to provide counseling 12. Constipation- LBM 5 days ago  2/23- will order Senokot-S 2 tabs qAM, and sorbitol and dulcolax suppository prn  2/24- had 2 BMs- feeling better 13. Neck tightness/myofascial pain  2/23- might benefit from trigger point injections if tramadol not effective  2/24- slept better with his tempurpedic pillow- will con't to monitor  2/26- notes neck pain better- con't to monitor- can still do injections if needed 14/ Rashes- will try cortisone lotion for both and see how it helps BID;  2/26- helping both rashes- con't regimen 15. LUE edema  2/25- concerned about DVT_ get Dopplers of LUE today and monitor Sx's.   2/26- Dopplers (-)- will get Koban to wrap LUE- by OT or PT- esp hand/fingers and encouraged pt to not let arm dangle.  2/28 reviewed massage and edema mgt techniques   3/1- pt's getting wrapped with kineseotape today to help swelling/edema.  16. Depression  2/26- pt wants ot restart Wellbutrin- will start 75 mg BID- low dose, esp because (although he knows it's worked) can lower seizure threshold slightly- will monitor for seizures.  2/27- happy to be on wellbutrin. Discussed the fact that his right cortical infarct may lead to emotional lability/PBA. Monitor for now. Might be a candidate for nuedexta if symptoms are ongoing or progressive.  17. COVID exposure-  3/1- will get COIVD test done asap- will wait for additional therapies today until we have COIVD test results.       LOS: 7 days A FACE TO FACE EVALUATION WAS PERFORMED  Ruben Chavez 02/04/2020, 11:04 AM

## 2020-02-04 NOTE — Progress Notes (Signed)
Spoke with Lonn Georgia who informed me that visitor was exposed to daughter who tested positive for COVID-19 on Friday (sx started Friday as well). Test resulted 02/03/20. Reportedly, visitor did not wear mask 100% of the time. Spoke with Dr. Baxter Flattery. As long as patient remains ASYMPTOMATIC, initiate droplet/contact isolation. Do not reuse droplet mask used in the room, discard each time. Ask patient to mask while staff are in the room. If patient is to receive any breathing treatment, elevate isolation to airborne/contact level PPE until end of treatment + 70 minutes to allow for room air exchanges. Recommend testing on 02/07/20 and 02/10/20, since visitor was exposed on Friday and would be considered potentially infectious 2 days after. We generally test on days 4 and 7. Continue isolation through 3/10 even if tests are negative. If patient becomes symptomatic or tests positive, initiate airborne/contact isolation immediately. Please try to perform all rehab activities in the room if possible. If not, keep patient masked and wait until the end the day with no other patients present.Please contact infection prevention if you have any further questions or concerns.

## 2020-02-05 ENCOUNTER — Inpatient Hospital Stay (HOSPITAL_COMMUNITY): Payer: PPO | Admitting: Speech Pathology

## 2020-02-05 ENCOUNTER — Inpatient Hospital Stay (HOSPITAL_COMMUNITY): Payer: PPO | Admitting: Occupational Therapy

## 2020-02-05 ENCOUNTER — Inpatient Hospital Stay (HOSPITAL_COMMUNITY): Payer: PPO

## 2020-02-05 LAB — CBC WITH DIFFERENTIAL/PLATELET
Abs Immature Granulocytes: 0.04 10*3/uL (ref 0.00–0.07)
Basophils Absolute: 0 10*3/uL (ref 0.0–0.1)
Basophils Relative: 1 %
Eosinophils Absolute: 0.2 10*3/uL (ref 0.0–0.5)
Eosinophils Relative: 3 %
HCT: 32.9 % — ABNORMAL LOW (ref 39.0–52.0)
Hemoglobin: 10.7 g/dL — ABNORMAL LOW (ref 13.0–17.0)
Immature Granulocytes: 1 %
Lymphocytes Relative: 9 %
Lymphs Abs: 0.8 10*3/uL (ref 0.7–4.0)
MCH: 31.5 pg (ref 26.0–34.0)
MCHC: 32.5 g/dL (ref 30.0–36.0)
MCV: 96.8 fL (ref 80.0–100.0)
Monocytes Absolute: 0.8 10*3/uL (ref 0.1–1.0)
Monocytes Relative: 10 %
Neutro Abs: 6.3 10*3/uL (ref 1.7–7.7)
Neutrophils Relative %: 76 %
Platelets: 465 10*3/uL — ABNORMAL HIGH (ref 150–400)
RBC: 3.4 MIL/uL — ABNORMAL LOW (ref 4.22–5.81)
RDW: 14 % (ref 11.5–15.5)
WBC: 8.2 10*3/uL (ref 4.0–10.5)
nRBC: 0 % (ref 0.0–0.2)

## 2020-02-05 LAB — BASIC METABOLIC PANEL
Anion gap: 7 (ref 5–15)
BUN: 15 mg/dL (ref 8–23)
CO2: 24 mmol/L (ref 22–32)
Calcium: 8.7 mg/dL — ABNORMAL LOW (ref 8.9–10.3)
Chloride: 106 mmol/L (ref 98–111)
Creatinine, Ser: 0.91 mg/dL (ref 0.61–1.24)
GFR calc Af Amer: >60 mL/min (ref >60)
GFR calc non Af Amer: >60 mL/min (ref >60)
Glucose, Bld: 99 mg/dL (ref 70–99)
Potassium: 3.7 mmol/L (ref 3.5–5.1)
Sodium: 137 mmol/L (ref 135–145)

## 2020-02-05 MED ORDER — NITROGLYCERIN 0.4 MG SL SUBL
SUBLINGUAL_TABLET | SUBLINGUAL | Status: AC
  Filled 2020-02-05: qty 1

## 2020-02-05 NOTE — Progress Notes (Signed)
Physical Therapy Weekly Progress Note  Patient Details  Name: Ruben Chavez MRN: [id number] Date of Birth: [birth year]  Beginning of progress report period: January 29, 2020 End of progress report period: February 05, 2020  Today's Date: 02/05/2020 PT Individual Time: 0900-1000 PT Individual Time Calculation (min): 60 min   Patient has met 3 of 3 short term goals.  Patient is progressing well with therapies. He currently is independent with bed mobility, requires supervision for transfers and gait >500 ft with intermittent CGA for steadying assist, CGA-close supervision for 10 steps with 1 rail, and CGA for dynamic gait on unlevel surfaces. He improved his Berg Balance Scale score by 23 points since admission and increased his time on the FiveTime Sit to Stand by 11 seconds. Both meet the MCID for these assessments for measures of fall risk assessment.   Patient continues to demonstrate the following deficits muscle weakness, decreased cardiorespiratoy endurance, abnormal tone, decreased coordination and decreased motor planning, decreased attention to left and decreased motor planning, decreased attention and decreased safety awareness and decreased standing balance, decreased postural control, hemiplegia and decreased balance strategies and therefore will continue to benefit from skilled PT intervention to increase functional independence with mobility.  Patient progressing toward long term goals..  Continue plan of care.  PT Short Term Goals Week 1:  PT Short Term Goal 1 (Week 1): Pt will perform wc to/from bed/mat w/LRAD and cga, minimal cues for safety PT Short Term Goal 1 - Progress (Week 1): Met PT Short Term Goal 2 (Week 1): Pt will perform bed mobility w/supervision attending to LUE positioining PT Short Term Goal 2 - Progress (Week 1): Met PT Short Term Goal 3 (Week 1): Pt will ambulate in home environment greater than 156f w/cga/LRAD PT Short Term Goal 3 - Progress (Week 1):  Met Week 2:  PT Short Term Goal 1 (Week 2): STG=LTG due to ELOS.  Skilled Therapeutic Interventions/Progress Updates:     Patient in w/c in room upon PT arrival. Patient alert and agreeable to PT session. Patient denied pain during session. He was visibly upset by just hearing that his wife had tested positive for COVID, nursing and medical staff aware. Comforted patient and discussed possible safe d/c options/strategies to reduce his chance of infection upon d/c. Patient reports that he has no other family or friends that he could stay with, and that his son can only provide intermittent assistance when he is not working. Patient also reported that he was disappointed that he would not be driving at d/c. Provided education on cognitive and physical deficits that would make driving unsafe at d/c from CIR. Encouraged him to speak to his OP therapists about goals for returning to driving. Also educated on general stroke recovery and coping strategies. Patient was receptive and appreciative of all education.   Therapeutic Activity: Bed Mobility: Patient reported that he is wearing his R arm out pulling on the bed rails at night. Had patient perform supine to/from sit and rolling L/R with the bed rails down in a flat bed. He performed this independently with good awareness of his L arm throughout without cues. He also demonstrated use of a pillow in supine and side-lying positions to support his L arm. Provided education on performing bed mobility without use of the bed rails as he just had to avoid overuse of R UE, patient in agreement.  Transfers: Patient performed sit to/from stand x7 with supervision without an AD. Provided verbal cues for forward weight shift  x2 for improved balance while standing. Five times Sit to Stand Test (FTSS) Method: Use a straight back chair with a solid seat that is 16-18" high. Ask participant to sit on the chair with arms folded across their chest.   Instructions: "Stand  up and sit down as quickly as possible 5 times, keeping your arms folded across your chest."   Measurement: Stop timing when the participant stands the 5th time.  TIME: __21____ (in seconds)  Times > 13.6 seconds is associated with increased disability and morbidity (Guralnik, 2000) Times > 15 seconds is predictive of recurrent falls in healthy individuals aged 97 and older (Buatois, et al., 2008) Normal performance values in community dwelling individuals aged 53 and older (Bohannon, 2006): o 60-69 years: 11.4 seconds o 70-79 years: 12.6 seconds o 80-89 years: 14.8 seconds  MCID: ? 2.3 seconds for Vestibular Disorders (Meretta, 2006)  Gait Training:  Patient ambulated 15 feet in the room x4 without an AD with supervision for safety. Ambulated with decreased gait speed, decreased step length and height, mild forward trunk lean, and decreased L arm swing. Provided verbal cues for erect posture and increased L scapular activation for L shoulder support while ambulating.  Neuromuscular Re-ed: Patient performed the following standing balance and L UE activities: -BSLS x 2: L 2 sec and 8 sec, R 9 sec and 12 sec, provided cues for increased gluteal activation and finding a visual focal point -He performed the [employer] International Scale, see details below. Patient demonstrates increased fall risk as noted by score of  51/56 on Berg Balance Scale.  (<36= high risk for falls, close to 100%; 37-45 significant >80%; 46-51 moderate >50%; 52-55 lower >25%) -L elbow flexion/extension with mild resistance and multimodal cues for increased motor response and timing -L wrist flexion/extension with PROM and motor imagery, progressed to self PROM and instructed to perform while sitting in the room to promote motor learning and for improved edema control with mobility  Patient in w/c in room at end of session with breaks locked, seat belt alarm set, and all needs within reach. Patient remained in the room with his  surgical mask donned throughout session.    Therapy Documentation Precautions:  Precautions Precautions: Fall Precaution Comments: L hemi UE>LE Restrictions Weight Bearing Restrictions: No  Balance: Standardized Balance Assessment Standardized Balance Assessment: Berg Balance Test Berg Balance Test Sit to Stand: Able to stand without using hands and stabilize independently Standing Unsupported: Able to stand safely 2 minutes Sitting with Back Unsupported but Feet Supported on Floor or Stool: Able to sit safely and securely 2 minutes Stand to Sit: Sits safely with minimal use of hands Transfers: Able to transfer safely, minor use of hands Standing Unsupported with Eyes Closed: Able to stand 10 seconds safely Standing Ubsupported with Feet Together: Able to place feet together independently and stand 1 minute safely From Standing, Reach Forward with Outstretched Arm: Can reach forward >12 cm safely (5") From Standing Position, Pick up Object from Floor: Able to pick up shoe safely and easily From Standing Position, Turn to Look Behind Over each Shoulder: Looks behind from both sides and weight shifts well Turn 360 Degrees: Able to turn 360 degrees safely but slowly Standing Unsupported, Alternately Place Feet on Step/Stool: Able to stand independently and safely and complete 8 steps in 20 seconds Standing Unsupported, One Foot in Front: Able to plae foot ahead of the other independently and hold 30 seconds Standing on One Leg: Able to lift leg independently and hold  5-10 seconds Total Score: 51/56  Therapy/Group: Individual Therapy  Taz Vanness L Jacque Byron PT, DPT  02/05/2020, 4:17 PM

## 2020-02-05 NOTE — Progress Notes (Signed)
Occupational Therapy Weekly Progress Note  Patient Details  Name: Ruben Chavez MRN: [id number] Date of Birth: 05-27-1951  Beginning of progress report period: January 29, 2020 End of progress report period: February 05, 2020  Today's Date: 02/05/2020 OT Individual Time: 1058-1130  & 1500-1540 OT Individual Time Calculation (min): 32 min & 40 min   Patient has met 4 of 4 short term goals.  Patient making progress in balance, awareness and motor control of left side resulting in increased independence and safety with all aspects of self care and mobility.    Patient continues to demonstrate the following deficits: muscle weakness, decreased cardiorespiratoy endurance, unbalanced muscle activation, decreased coordination and decreased motor planning and decreased standing balance and hemiplegia and therefore will continue to benefit from skilled OT intervention to enhance overall performance with BADL and iADL.  Patient progressing toward long term goals..  Continue plan of care.  OT Short Term Goals Week 1:  OT Short Term Goal 1 (Week 1): Pt will require no more than min cueing for use of hemi technique during dressing OT Short Term Goal 1 - Progress (Week 1): Met OT Short Term Goal 2 (Week 1): Pt will don shirt with min A OT Short Term Goal 2 - Progress (Week 1): Met OT Short Term Goal 3 (Week 1): Pt will don pants with min A OT Short Term Goal 3 - Progress (Week 1): Met OT Short Term Goal 4 (Week 1): Pt will transfer to toilet with LRAD with CGA OT Short Term Goal 4 - Progress (Week 1): Met Week 2:  OT Short Term Goal 1 (Week 2): STG = LTG, overall S level  Skilled Therapeutic Interventions/Progress Updates:    AM session:  Patient seated in w/c, alert and aware of needs.  Ambulation in room to/from w/c, toilet and bed with CS.  toileting completed with CS.  Bed mobility CS.  Completed supine motor control activities with focus on coordination and strength proximal left UE - weakness at  ER, reviewed positioning and focus on POS to reduce compensatory techniques.  Returned to w/c at close of session with CS, seat belt fastened and call bell/tray table in reach.    PM session:  Patient ready for afternoon session.  Reviewed progress to date and plan of care.  Adjusted kinesiotape as dorsal aspect not staying due to arm hair.  Utilized hand paddle for weight bearing surface.  Completed standing wall slide hip to [facility] - requires min A to maintain straight path - IR inhibition needed.  Completed weight bearing and attempted wrist facilitation - no volitional motion noted wrist or hand.  Patient remained seated in w/c at close of session. Seat belt alarm set and call bell in reach.    Therapy Documentation Precautions:  Precautions Precautions: Fall Precaution Comments: L hemi UE>LE Restrictions Weight Bearing Restrictions: No General:   Vital Signs:   Pain: Pain Assessment Pain Scale: 0-10 Pain Score: 0-No pain   Therapy/Group: Individual Therapy  Carlos Levering 02/05/2020, 12:46 PM

## 2020-02-05 NOTE — Patient Care Conference (Signed)
Inpatient RehabilitationTeam Conference and Plan of Care Update Date: 02/05/2020   Time: 11:45 AM   Patient Name: Ruben Chavez      Medical Record Number: [id number]  Date of Birth: 1951/05/31 Sex: Male         Room/Bed: 4M13C/4M13C-01 Payor Info: Payor: [employer] / Plan: [employer] PPO / Product Type: *No Product type* /    Admit Date/Time:  01/28/2020  5:43 PM  Primary Diagnosis:  Right middle cerebral artery stroke [facility])  Patient Active Problem List   Diagnosis Date Noted  . Cognitive and neurobehavioral dysfunction   . Hypertensive urgency 01/28/2020  . Urinary retention 01/28/2020  . Right middle cerebral artery stroke ([place]) 01/28/2020  . Stroke (cerebrum) ([place]) -  [name] 01/25/2020  . Acute respiratory failure with hypoxia ([place])   . Hypotension   . Hypokalemia   . Hypocalcemia   . Encephalopathy acute   . Stroke ([place]) 01/24/2020  . Middle cerebral artery embolism, right 01/24/2020  . Hyperlipidemia 02/21/2019  . Impingement syndrome of right shoulder region 02/21/2018  . OA (osteoarthritis) of hip 01/18/2018  . Prostate cancer ([place]) 03/04/2016  . Essential hypertension 11/27/2014  . History of colonic polyps 09/26/2014  . BACK PAIN, LEFT 12/22/2009  . PROSTATE SPECIFIC ANTIGEN, ELEVATED 12/20/2008  . LATERAL EPICONDYLITIS 07/15/2008  . ACTINIC KERATOSIS, FOREHEAD, LEFT 05/23/2008  . WART, LEFT HAND 07/19/2007  . ERECTILE DYSFUNCTION, MILD 07/19/2007  . HERNIA, BILATERAL INGUINAL W/O OBST/GANGRENE 07/19/2007  . MENISCUS TEAR 07/19/2007  . Bilateral inguinal hernia 07/19/2007    Expected Discharge Date: Expected Discharge Date: (TBD)  Team Members Present: Physician leading conference: Dr. Courtney Heys Care Coodinator Present: Loralee Pacas, LCSWA;Genie Heloise Gordan, RN, MSN Nurse Present: Other (comment)(Amanda Archie, RN) PT Present:  Apolinar Junes, PT OT Present: Elisabeth Most, OT SLP Present: Weston Anna, SLP PPS Coordinator present : Ileana Ladd, Burna Mortimer, SLP     Current Status/Progress Goal Weekly Team Focus  Bowel/Bladder   Patient is contient of B&B LBM 02/02/20  Remain continent  Assist with B&B every shift.PRN   Swallow/Nutrition/ Hydration             ADL's   CS/set up dressing, grooming CS/set up in stance at sink.  functional transfers with CGA/CS  supervision  family education, left UE NMR   Mobility   CGA-supervision overall, gait >400 ft, 10 steps 1 rail  Mod I bed mobility and sit to stand, supervision transfers, gait 200 ft, and 14 steps  Balance, L UE NMR, activity tolerance, functional mobility, gait and stair training, patient/caregiver education   Communication   Supervision-Mod I  Mod I  use of speech intelligibility strategies   Safety/Cognition/ Behavioral Observations  Min A  Supervision  attention, problem solving, awareness   Pain   Pt c/o pain to LUE, PRN tylenol 4 out of 10  Pain 3 or less  Assess pain every shift / PRN   Skin   MASD with buttocks/groin, incision to chest with transparent dressing  Prevent further skin breakdown  Assess skin every shift/PRN    Rehab Goals Patient on target to meet rehab goals: Yes *See Care Plan and progress notes for long and short-term goals.     Barriers to Discharge  Current Status/Progress Possible Resolutions Date Resolved   Nursing  PT  Lack of/limited family support;Home environment access/layout;Medical stability;Behavior  Patient's daughter tested positive for COVID and planned to be main caregiver at d/c, patient currently pending COVID testing; patient has 2 level home and needs to access second level safely, patient with decreased attentin and safety awareness (improving)              OT                  SLP                SW Lack of/limited family support;Decreased caregiver support               Discharge Planning/Teaching Needs:  D/C to home with wife. Dtr will be staying for an additional week to assist with his wife's care needs since she will have hip surgery on 3/2. pt needs to be independent at d/c.  Family education as recommended by therapy   Team Discussion: On droplet precautions, wife + Covid, patient was exposed Saturday, will test for Covid Thursday.  RN tylenol for pain, TEDs on, wearing his mask.  OT self care CGA/S, can stand 15 mins at sink, steady progress.  PT has a house at the [place], gait S, amb 500', endurance improving, S goals.  SLP decreased impulsivity, improved safety awareness, increased attention, making good gains.   Revisions to Treatment Plan: N/A     Medical Summary Current Status: nursing wise- continent- B/B- ; denies pain overall- tylenol prn Weekly Focus/Goal: OT- doing all self care himself CGA-supervision- occ needs CGA. Can stand 15 minutes for grooming; great return in LUE; hand still edematous; has supination/pronation which is new.  Barriers to Discharge: Behavior;Weight bearing restrictions;Medical stability;Medication compliance  Barriers to Discharge Comments: LUE edema- at goal level for OT mostly- SLP- making cognitive gains; impulsiveness still but better; good gains. Possible Resolutions to Barriers: PT_ gait improving - was CGA, now Supervsion; walked 557f with no AD; balance improving; goals level Supervison   Continued Need for Acute Rehabilitation Level of Care: The patient requires daily medical management by a physician with specialized training in physical medicine and rehabilitation for the following reasons: Direction of a multidisciplinary physical rehabilitation program to maximize functional independence : Yes Medical management of patient stability for increased activity during participation in an intensive rehabilitation regime.: Yes Analysis of laboratory values and/or radiology reports with any subsequent need for  medication adjustment and/or medical intervention. : Yes   I attest that I was present, lead the team conference, and concur with the assessment and plan of the team.   LRetta Diones3/01/2020, 7:00 PM   Team conference was held via web/ teleconference due to [place]- 19

## 2020-02-05 NOTE — Progress Notes (Signed)
Patient continues on Droplet and contact precautions. Patient continues to wear his mask when staff enter his room and during cares. Patient remains symptom free of covid at this time. Will continue to monitor.

## 2020-02-05 NOTE — Progress Notes (Addendum)
[place] PHYSICAL MEDICINE & REHABILITATION PROGRESS NOTE   Subjective/Complaints:   Pt reports still more movement of LUE-  Mask fogging eyeglasses, but still wearing mask-  Per SLP, pt less impulsive, however pt also asking while SLP in room, about driving when leaves rehab- explained after a CVA, pt's response time is slowed and would be at risk for an MVA if drives at this time.   Will be a few months until can drive.    ROS: pt denies resp Sx's, SOB, CP, abd pain, N?V/C/D  Objective:   No results found. No results for input(s): WBC, HGB, HCT, PLT in the last 72 hours. Recent Labs    02/04/20 0628  CREATININE 0.95    Intake/Output Summary (Last 24 hours) at 02/05/2020 1001 Last data filed at 02/04/2020 1844 Gross per 24 hour  Intake 444 ml  Output --  Net 444 ml    Labs not done yet are active!   Physical Exam: Vital Signs Blood pressure 113/75, pulse 70, temperature 98.3 F (36.8 C), resp. rate 20, height 5\' 11"  (1.803 m), weight 82.3 kg, SpO2 98 %.  Physical Exam Constitutional: No distress  Pt sitting up in manual w/c, working with SLP on memory tasks, NAD   HEENT: conjugate gaze;  Neck: supple Cardiovascular: RRR; no M/R/G Respiratory/Chest: CTA B/L- no W/R/R   GI/Abdomen: soft, NT, ND, (+)BS Ext: no clubbing, cyanosis-  L hand much better swelling with K-tape on tope of hand/forearm- down to 1-2+ Musculoskeletal:  LUE biceps 3/5 and triceps 3/5- nothing distally General: Normal range of motion.  Cervical back:tight musculature in upper traps, levators and scalenes as well as rhomboids B/L  less impulsive in behavior but still asking about driving- still fair to poor insight into deficits.  Skin: -rash on back looks bette-r less erythema/less red Rash on inner thighs- improved- almost gone  Psychiatric: talkative    Assessment/Plan: 1. Functional deficits secondary to R MCA stroke with L hemiparesis which require 3+ hours per day of  interdisciplinary therapy in a comprehensive inpatient rehab setting.  Physiatrist is providing close team supervision and 24 hour management of active medical problems listed below.  Physiatrist and rehab team continue to assess barriers to discharge/monitor patient progress toward functional and medical goals  Care Tool:  Bathing  Bathing activity did not occur: Refused Body parts bathed by patient: Chest, Left arm, Abdomen, Face, Front perineal area, Right upper leg, Left upper leg, Right lower leg, Left lower leg, Buttocks   Body parts bathed by helper: Front perineal area, Buttocks     Bathing assist Assist Level: Minimal Assistance - Patient > 75%     Upper Body Dressing/Undressing Upper body dressing Upper body dressing/undressing activity did not occur (including orthotics): Refused What is the patient wearing?: Pull over shirt    Upper body assist Assist Level: Contact Guard/Touching assist    Lower Body Dressing/Undressing Lower body dressing    Lower body dressing activity did not occur: Refused What is the patient wearing?: Underwear/pull up, Pants     Lower body assist Assist for lower body dressing: Contact Guard/Touching assist     Toileting Toileting Toileting Activity did not occur (Clothing management and hygiene only): N/A (no void or bm)  Toileting assist Assist for toileting: Moderate Assistance - Patient 50 - 74%     Transfers Chair/bed transfer  Transfers assist     Chair/bed transfer assist level: Contact Guard/Touching assist     Locomotion Ambulation   Ambulation assist  Assist level: Contact Guard/Touching assist Assistive device: Walker-rolling Max distance: 215ft   Walk 10 feet activity   Assist     Assist level: Contact Guard/Touching assist Assistive device: No Device   Walk 50 feet activity   Assist    Assist level: Contact Guard/Touching assist Assistive device: No Device    Walk 150 feet  activity   Assist Walk 150 feet activity did not occur: Safety/medical concerns(distance not tolerated today)  Assist level: Contact Guard/Touching assist Assistive device: No Device    Walk 10 feet on uneven surface  activity   Assist     Assist level: Contact Guard/Touching assist Assistive device: Other (comment)(no device)   Wheelchair     Assist Will patient use wheelchair at discharge?: No   Wheelchair activity did not occur: N/A         Wheelchair 50 feet with 2 turns activity    Assist            Wheelchair 150 feet activity     Assist          Blood pressure 113/75, pulse 70, temperature 98.3 F (36.8 C), resp. rate 20, height 5\' 11"  (1.803 m), weight 82.3 kg, SpO2 98 %.  Medical Problem List and Plan: 1.Left side weakness with dysarthria and facial droopsecondary to right MCA infarction.Status post loop recorder placement  3/1- pt has new L biceps and triceps movement- over weekend- will con't OT for UB strengthening.   3/2- put on droplet precautions for possible COVID- has been exposed -patient may shower -ELOS/Goals: 10-14 days, mod I 2. Antithrombotics: -DVT/anticoagulation:Lovenox -antiplatelet therapy: Aspirin 325 mg daily and Plavix 75 mg daily x3 months then aspirin alone 3. Pain Management:Tylenol as needed  2/23- pt took Norco at home- willing to try tramadol 50 mg q6 hours prn ordered- if not effective, will do Norco  2/25- less back pain  3/2- no complaints of pain 4. Mood:Provide emotional support -antipsychotic agents: N/A 5. Neuropsych: This patientiscapable of making decisions on hisown behalf. 6. Skin/Wound Care:Routine skin checks 7. Fluids/Electrolytes/Nutrition:Routine in and outs with follow-up chemistries  2/23- kidney function great and K+ is 3.7- con't to monitor  2/25- check tomorrow   3/1- labs in AM- Cr 0.95 today.   3/2- labs in process-  pending- will address as comes back in.  8. Hypertension. Lisinopril 20 mg daily. Monitor with increased mobility  2/25- BP running 0000000 systolic- con't regimen  [id number] bp controlled  3/1- BP running 120s/70s- well controlled- con't regimen.  9. Hyperlipidemia. Lipitor 10.Prostate cancerwith history of radioactive seed implant. Follow-up outpatient 11. Remote tobacco abuse. Continue to provide counseling 12. Constipation- LBM 5 days ago  2/23- will order Senokot-S 2 tabs qAM, and sorbitol and dulcolax suppository prn  2/24- had 2 BMs- feeling better 13. Neck tightness/myofascial pain  2/23- might benefit from trigger point injections if tramadol not effective  2/24- slept better with his tempurpedic pillow- will con't to monitor  2/26- notes neck pain better- con't to monitor- can still do injections if needed 14/ Rashes- will try cortisone lotion for both and see how it helps BID;  2/26- helping both rashes- con't regime  3/2- Inner thigh rash gone- con't meds for back rash 15. LUE edema  2/25- concerned about DVT_ get Dopplers of LUE today and monitor Sx's.   2/26- Dopplers (-)- will get Koban to wrap LUE- by OT or PT- esp hand/fingers and encouraged pt to not let arm dangle.  2/28 reviewed massage and edema  mgt techniques   3/1- pt's getting wrapped with kineseotape today to help swelling/edema.   3/2- hand looks 30% better today- con't Ktape 16. Depression  2/26- pt wants ot restart Wellbutrin- will start 75 mg BID- low dose, esp because (although he knows it's worked) can lower seizure threshold slightly- will monitor for seizures.   2/27- happy to be on wellbutrin. Discussed the fact that his right cortical infarct may lead to emotional lability/PBA. Monitor for now. Might be a candidate for nuedexta if symptoms are ongoing or progressive.  17. COVID exposure-  3/1- will get COIVD test done asap- will wait for additional therapies today until we have COIVD test results.    3/2- wife, who came to visit pt, is (+) in spite of COVID vaccine (2nd one 2 days prior to exposure)- will keep on droplet precautions x 10 days minimum and test Thursday.  Spoke to infection control nurse- went over plan- x10 minutes Also explained not appropriate to drive for at least 2-3 months- will d/w team.      LOS: 8 days A FACE TO FACE EVALUATION WAS PERFORMED  Ruben Chavez 02/05/2020, 10:01 AM

## 2020-02-05 NOTE — Progress Notes (Signed)
Speech Language Pathology Weekly Progress and Session Note  Patient Details  Name: Ruben Chavez MRN: [id number] Date of Birth: 1951-10-03  Beginning of progress report period: January 28, 2020 End of progress report period: February 05, 2020  Today's Date: 02/05/2020 SLP Individual Time: 0725-0820 SLP Individual Time Calculation (min): 55 min  Short Term Goals: Week 1: SLP Short Term Goal 1 (Week 1): Patient will demonstrate sustained attention to functional tasks for 45 minutes with Min verbal cues for redirection. SLP Short Term Goal 1 - Progress (Week 1): Met SLP Short Term Goal 2 (Week 1): Patient will demonstrate functional problem solving for mildly complex but familiar tasks with Min A verbal cues. SLP Short Term Goal 2 - Progress (Week 1): Met SLP Short Term Goal 3 (Week 1): Patient will self-monitor and correct errors during functional tasks with Mod A verbal cues. SLP Short Term Goal 3 - Progress (Week 1): Met SLP Short Term Goal 4 (Week 1): Patient will utilize speech intelligibility strategies at the conversation level with supervision verbal cues. SLP Short Term Goal 4 - Progress (Week 1): Met    New Short Term Goals: Week 2: SLP Short Term Goal 1 (Week 2): STGs=LTGs due to ELOS  Weekly Progress Updates: Patient has made functional gains and has met 4 of 4 STGs this reporting period. Currently, patient requires overall Min A verbal cues to complete functional and mildly complex tasks safely in regards to problem solving, selective attention and emergent awareness of errors. Patient also demonstrates improved speech intelligibility at the conversation level requiring overall supervision level verbal cues for use of compensatory strategies. Patient and family education ongoing. Patient would benefit from continued skilled SLP intervention to maximize his cognitive functioning and speech intelligibility prior to discharge.       Intensity: Minumum of 1-2 x/day, 30 to 90  minutes Frequency: 3 to 5 out of 7 days Duration/Length of Stay: 02/12/20 Treatment/Interventions: Cognitive remediation/compensation;Internal/external aids;Speech/Language facilitation;Cueing hierarchy;Environmental controls;Therapeutic Activities;Functional tasks;Patient/family education   Daily Session  Skilled Therapeutic Interventions:  Skilled treatment session focused on cognitive goals. Upon arrival, patient requested to use the bathroom. Patient ambulated to the bathroom with CGA and performed all self-care tasks with supervision and extra time. Patient also performed basic self-care tasks at the sink while standing with extra time and intermittent assist to open containers (toothpaste, etc). SLP also facilitated session by providing supervision verbal cues for organization during a complex calendar task and Min-Mod A verbal cues for sustained attention to task due to mild verbosity. Patient also required Max-Total A for anticipatory awareness in regards to d/c planning. Patient left upright in wheelchair with alarm on and all needs within reach. Continue with current plan of care.      Pain No/Denies Pain   Therapy/Group: Individual Therapy  Zyaira Vejar 02/05/2020, 6:10 AM

## 2020-02-06 ENCOUNTER — Inpatient Hospital Stay (HOSPITAL_COMMUNITY): Payer: PPO | Admitting: Physical Therapy

## 2020-02-06 ENCOUNTER — Inpatient Hospital Stay (HOSPITAL_COMMUNITY): Payer: PPO | Admitting: Occupational Therapy

## 2020-02-06 ENCOUNTER — Inpatient Hospital Stay (HOSPITAL_COMMUNITY): Payer: PPO

## 2020-02-06 NOTE — Progress Notes (Signed)
Patient has denied shortness of breath, cough, loss of taste/smell or other symptoms of covid19. Pt up in wc at this time with chair alarm in place. Call light in reach.

## 2020-02-06 NOTE — Progress Notes (Signed)
Physical Therapy Session Note  Patient Details  Name: DEAGAN CHICAS MRN: [id number] Date of Birth: May 01, 1951  Today's Date: 02/06/2020 PT Individual Time: 0800-0912 PT Individual Time Calculation (min): 72 min   Short Term Goals: Week 2:  PT Short Term Goal 1 (Week 2): STG=LTG due to ELOS.  Skilled Therapeutic Interventions/Progress Updates:   Pt presents sitting in w/c and agreeable to therapy.  Pt is on bedroom treatment only pending Covid testing.  Assisted pt w/ donning knee-high TED hose and sneakers.  Pt stood at sink to brush teeth w/o UE support w/o LOB.  Pt performed multiple standing dynamic balance activities in room including side-stepping, marching, toe-taps to floor mat, toe-walking, amb carrying cup of water and heavy pillow.  Pt amb multiple trials w/o AD in room, negotiating obstacles w/o LOB, although occasional reach for external support.  Pt left sitting in w/c w/ all needs close and seat alarm on.     Therapy Documentation Precautions:  Precautions Precautions: Fall Precaution Comments: L hemi UE>LE Restrictions Weight Bearing Restrictions: No General:   Vital Signs: Therapy Vitals BP: (!) 119/54 Pain: 0/10         Therapy/Group: Individual Therapy  Ladoris Gene 02/06/2020, 12:31 PM

## 2020-02-06 NOTE — Progress Notes (Signed)
Occupational Therapy Session Note  Patient Details  Name: Ruben Chavez MRN: [id number] Date of Birth: 11-02-1951  Today's Date: 02/06/2020 OT Individual Time: 55-1558 OT Individual Time Calculation (min): 73 min    Short Term Goals: Week 2:  OT Short Term Goal 1 (Week 2): STG = LTG, overall S level  Skilled Therapeutic Interventions/Progress Updates:    Upon entering the room, pt seated in wheelchair with no c/o pain and agreeable to shower. Pt demonstrated A/AROM for shoulder and elbow in all planes of movement x 5 reps. Pt needs PROM for wrist and digits as well as increased edema noted. Pt ambulating with CGA into bathroom without use of AD and required min cuing for safety awareness to doff clothing items while seated. Pt bathing from shower level while seated on TTB and almost able to reach R armpit with L UE but needing hand over hand assistance for thoroughness. Pt transferred from shower onto commode due to urge for BM but unable to void. Pt drying self further and ambulating to wheelchair to don LB clothing items with CGA for balance when standing. Pt standing at sink for 15 minutes with grooming tasks and L UE placed into weight bearing position on sink. Pt returning to wheelchair at end of session and donning pull over shirt with supervision and min cuing for hemiplegic technique. Chair alarm belt donned and call bell within reach upon exiting the room.   Therapy Documentation Precautions:  Precautions Precautions: Fall Precaution Comments: L hemi UE>LE Restrictions Weight Bearing Restrictions: No General:   Vital Signs: Therapy Vitals Temp: 98.7 F (37.1 C) Pulse Rate: 67 Resp: 17 BP: 118/69 Patient Position (if appropriate): Sitting Oxygen Therapy SpO2: 99 % O2 Device: Room Air   Therapy/Group: Individual Therapy  Gypsy Decant 02/06/2020, 4:12 PM

## 2020-02-06 NOTE — Progress Notes (Signed)
[place] PHYSICAL MEDICINE & REHABILITATION PROGRESS NOTE   Subjective/Complaints:   Pt not wearing mask when I arrived- put on but kept pushing down below nose- reminded multiple times to push back up.   Denies any COVID Sx's- loss of taste/smell, sneezing, coughing, SOB, DOE.    ROS: pt denies CP, SOB, N/V/C/D  Objective:   No results found. Recent Labs    02/05/20 0915  WBC 8.2  HGB 10.7*  HCT 32.9*  PLT 465*   Recent Labs    02/04/20 0628 02/05/20 0915  NA  --  137  K  --  3.7  CL  --  106  CO2  --  24  GLUCOSE  --  99  BUN  --  15  CREATININE 0.95 0.91  CALCIUM  --  8.7*    Intake/Output Summary (Last 24 hours) at 02/06/2020 1219 Last data filed at 02/06/2020 0800 Gross per 24 hour  Intake 720 ml  Output --  Net 720 ml    Labs not done yet are active!   Physical Exam: Vital Signs Blood pressure (!) 119/54, pulse 68, temperature 97.9 F (36.6 C), temperature source Oral, resp. rate 18, height 5\' 11"  (1.803 m), weight 82.3 kg, SpO2 98 %.  Physical Exam Constitutional: sitting up in manual w/c working on grooming tasks at sink on own, NAD HEENT: conjugate gaze;  No signs of resp dripping Neck: supple Cardiovascular: RRR Respiratory/Chest: CTA B/L- good air movement   GI/Abdomen: soft, NT, ND, (+)BS Ext: no clubbing, cyanosis-  L hand much better swelling with K-tape on tope of hand/forearm- still taped and swelling stable since yesterday Musculoskeletal:  LUE strength still 3/5 in L biceps and triceps and now has some supination/pronation which is new General: Normal range of motion.  Cervical back:tight musculature in upper traps, levators and scalenes as well as rhomboids B/L Didn't seem to understand that daughter timing of leaving didn't make sense to [place] Skin: -rash on back looks bette-r less erythema/less red Rash on inner thighs- improved- almost gone  Psychiatric: talkative    Assessment/Plan: 1. Functional deficits secondary  to R MCA stroke with L hemiparesis which require 3+ hours per day of interdisciplinary therapy in a comprehensive inpatient rehab setting.  Physiatrist is providing close team supervision and 24 hour management of active medical problems listed below.  Physiatrist and rehab team continue to assess barriers to discharge/monitor patient progress toward functional and medical goals  Care Tool:  Bathing  Bathing activity did not occur: Refused Body parts bathed by patient: Chest, Left arm, Abdomen, Face, Front perineal area, Right upper leg, Left upper leg, Right lower leg, Left lower leg, Buttocks   Body parts bathed by helper: Front perineal area, Buttocks     Bathing assist Assist Level: Minimal Assistance - Patient > 75%     Upper Body Dressing/Undressing Upper body dressing Upper body dressing/undressing activity did not occur (including orthotics): Refused What is the patient wearing?: Pull over shirt    Upper body assist Assist Level: Contact Guard/Touching assist    Lower Body Dressing/Undressing Lower body dressing    Lower body dressing activity did not occur: Refused What is the patient wearing?: Underwear/pull up, Pants     Lower body assist Assist for lower body dressing: Contact Guard/Touching assist     Toileting Toileting Toileting Activity did not occur (Clothing management and hygiene only): N/A (no void or bm)  Toileting assist Assist for toileting: Moderate Assistance - Patient 50 - 74%  Transfers Chair/bed transfer  Transfers assist     Chair/bed transfer assist level: Supervision/Verbal cueing     Locomotion Ambulation   Ambulation assist      Assist level: Supervision/Verbal cueing Assistive device: No Device Max distance: 45(inroom                    only)   Walk 10 feet activity   Assist     Assist level: Supervision/Verbal cueing Assistive device: No Device   Walk 50 feet activity   Assist    Assist level: Contact  Guard/Touching assist Assistive device: No Device    Walk 150 feet activity   Assist Walk 150 feet activity did not occur: Safety/medical concerns(distance not tolerated today)  Assist level: Contact Guard/Touching assist Assistive device: No Device    Walk 10 feet on uneven surface  activity   Assist     Assist level: Contact Guard/Touching assist Assistive device: Other (comment)(no device)   Wheelchair     Assist Will patient use wheelchair at discharge?: No   Wheelchair activity did not occur: N/A         Wheelchair 50 feet with 2 turns activity    Assist            Wheelchair 150 feet activity     Assist          Blood pressure (!) 119/54, pulse 68, temperature 97.9 F (36.6 C), temperature source Oral, resp. rate 18, height 5\' 11"  (1.803 m), weight 82.3 kg, SpO2 98 %.  Medical Problem List and Plan: 1.Left side weakness with dysarthria and facial droopsecondary to right MCA infarction.Status post loop recorder placement  3/1- pt has new L biceps and triceps movement- over weekend- will con't OT for UB strengthening.   3/2- put on droplet precautions for possible COVID- has been exposed  3/3- has new pronation and supination on LUE -patient may shower -ELOS/Goals: 10-14 days, mod I 2. Antithrombotics: -DVT/anticoagulation:Lovenox -antiplatelet therapy: Aspirin 325 mg daily and Plavix 75 mg daily x3 months then aspirin alone 3. Pain Management:Tylenol as needed  2/23- pt took Norco at home- willing to try tramadol 50 mg q6 hours prn ordered- if not effective, will do Norco  2/25- less back pain  3/3- pain not an issue per pt.  4. Mood:Provide emotional support -antipsychotic agents: N/A 5. Neuropsych: This patientiscapable of making decisions on hisown behalf. 6. Skin/Wound Care:Routine skin checks 7. Fluids/Electrolytes/Nutrition:Routine in and outs with follow-up  chemistries  2/23- kidney function great and K+ is 3.7- con't to monitor  2/25- check tomorrow   3/1- labs in AM- Cr 0.95 today.   3/2- labs in process- pending- will address as comes back in.  8. Hypertension. Lisinopril 20 mg daily. Monitor with increased mobility  2/25- BP running 0000000 systolic- con't regimen  [id number] bp controlled  3/1- BP running 120s/70s- well controlled- con't regimen  3/3- BP running 110s/70s- well controlled- con't regimen.  9. Hyperlipidemia. Lipitor 10.Prostate cancerwith history of radioactive seed implant. Follow-up outpatient 11. Remote tobacco abuse. Continue to provide counseling 12. Constipation- LBM 5 days ago  2/23- will order Senokot-S 2 tabs qAM, and sorbitol and dulcolax suppository prn  2/24- had 2 BMs- feeling better 13. Neck tightness/myofascial pain  2/23- might benefit from trigger point injections if tramadol not effective  2/24- slept better with his tempurpedic pillow- will con't to monitor  2/26- notes neck pain better- con't to monitor- can still do injections if needed  3/3- reports doesn't  need injections 14/ Rashes- will try cortisone lotion for both and see how it helps BID;  2/26- helping both rashes- con't regime  3/2- Inner thigh rash gone- con't meds for back rash 15. LUE edema  2/25- concerned about DVT_ get Dopplers of LUE today and monitor Sx's.   2/26- Dopplers (-)- will get Koban to wrap LUE- by OT or PT- esp hand/fingers and encouraged pt to not let arm dangle.  2/28 reviewed massage and edema mgt techniques   3/1- pt's getting wrapped with kineseotape today to help swelling/edema.   3/2- hand looks 30% better today- con't Ktape  3/3- stable today- con't Ktape 16. Depression  2/26- pt wants ot restart Wellbutrin- will start 75 mg BID- low dose, esp because (although he knows it's worked) can lower seizure threshold slightly- will monitor for seizures.   2/27- happy to be on wellbutrin. Discussed the fact that his  right cortical infarct may lead to emotional lability/PBA. Monitor for now. Might be a candidate for nuedexta if symptoms are ongoing or progressive.  17. COVID exposure-  3/1- will get COIVD test done asap- will wait for additional therapies today until we have COIVD test results.   3/2- wife, who came to visit pt, is (+) in spite of COVID vaccine (2nd one 2 days prior to exposure)- will keep on droplet precautions x 10 days minimum and test Thursday.  Spoke to infection control nurse- went over plan- x10 minutes Also explained not appropriate to drive for at least 2-3 months- will d/w team.   3/3- pt insists daughter to be off precautions as of 3/5- which doesn't correlate with [place] being told last Friday was the day she tested (+)- will need to figure this out for infection control to determine overall plan.   - also wait for pt's COVID test to determine plan    LOS: 9 days A FACE TO FACE EVALUATION WAS PERFORMED  Jaxston Chohan 02/06/2020, 12:19 PM

## 2020-02-06 NOTE — Progress Notes (Signed)
Physical Therapy Session Note  Patient Details  Name: Ruben Chavez MRN: [id number] Date of Birth: Jun 26, 1951  Today's Date: 02/06/2020 PT Individual Time: 1000-1057 PT Individual Time Calculation (min): 57 min   Short Term Goals: Week 2:  PT Short Term Goal 1 (Week 2): STG=LTG due to ELOS.  Skilled Therapeutic Interventions/Progress Updates:     Patient in w/c in room upon PT arrival. Patient alert and agreeable to PT session. Patient denied pain during session. Patient continues to have concerns about his family and discussed ELOS and POC based on COVID results. Informed him that the therapy team is consulting with medical team about POC changes and ELOS based on his families symptom presentation and his testing results.  Therapeutic Activity: Transfers: Patient performed sit to/from stands from the w/c and standard chair with with supervision without an AD. Provided verbal cues for no use of UEs for increased strengthening with a functional activity. Patient ambulated 20-40 feet in the room throughout session without an AD with supervision for safety. Ambulated with decreased gait speed, decreased step length, mild forward trunk lean, and decreased L arm swing. Provided verbal cues for erect posture and increased scapular activation to support L arm while ambulating. While ambulating in the room with supervision at end of session patient moved furniture and picked up shoes, trash cans, and mats on the floor and put them away to simulate activities that would occur in the home setting.   Neuromuscular Re-ed: Patient performed the following L UE motor control and dynamic gait activities: -Obstacle course in hospital room: ambulating around obstacles performing side stepping, 360 turn, figure eight around garbage cans, stepping over shoes and a trash can lying on its side, walking forward sand bacwards on 2 stacked floor mats with towel rolls between them for unlevel surface and walking  backwards to the w/c. Proved demonstration of course x2. Patient performed trial run with max cues for sequencing. He then performed 3 timed trials: Trial 1: 1 min 18 sec 1 cue for sequencing Trial 2: 56 sec no cues for sequencing Trial 3: 53 sec no cues for sequencing -L UE wall push ups 2x10 with arm in front, out to the side, and pushing down on the window sill for close chain motor control with weight bearing   Patient in w/c in room with L lap tray and towel placed under L arm for edema control at end of session with breaks locked, seat belt alarm set, and all needs within reach.    Therapy Documentation Precautions:  Precautions Precautions: Fall Precaution Comments: L hemi UE>LE Restrictions Weight Bearing Restrictions: No    Therapy/Group: Individual Therapy  Ruben Chavez L Ruben Chavez PT, DPT  02/06/2020, 4:38 PM

## 2020-02-06 NOTE — Progress Notes (Signed)
Social Work Patient ID: [name], male   DOB: 1951-05-02, 69 y.o.   MRN: [id number]    02/05/2020- SW met with pt in room to discuss d/c date of 02/12/2020 and to get updates on family members who tested positive for COVID. Pt called his wife while SW in room to discuss plan of care. She reported that she was at [facility] and was getting antibodies. She states that at this time their dtr will be here to Sunday and will travel back to [place]. She also states that her surgery was changed to April 2021. When discussing family education with a negative COVID family member, pt reports their son Tomasita Crumble can come in. SW informed will provide these updates to medical team, and will follow-up about best medical plan for pt once there is a decision made.   Loralee Pacas, MSW, [place] Office: [phone] Cell: [phone] Fax: [phone]

## 2020-02-07 ENCOUNTER — Inpatient Hospital Stay (HOSPITAL_COMMUNITY): Payer: PPO | Admitting: Occupational Therapy

## 2020-02-07 ENCOUNTER — Inpatient Hospital Stay (HOSPITAL_COMMUNITY): Payer: PPO | Admitting: Speech Pathology

## 2020-02-07 ENCOUNTER — Inpatient Hospital Stay (HOSPITAL_COMMUNITY): Payer: PPO

## 2020-02-07 ENCOUNTER — Ambulatory Visit: Payer: PPO

## 2020-02-07 LAB — SARS CORONAVIRUS 2 (TAT 6-24 HRS): SARS Coronavirus 2: NEGATIVE

## 2020-02-07 NOTE — Plan of Care (Signed)
  Problem: Consults Goal: RH STROKE PATIENT EDUCATION Description: See Patient Education module for education specifics  Outcome: Progressing   Problem: RH BOWEL ELIMINATION Goal: RH STG MANAGE BOWEL W/MEDICATION W/ASSISTANCE Description: STG Manage Bowel with Medication with Assistance. Min Outcome: Progressing   Problem: RH BLADDER ELIMINATION Goal: RH STG MANAGE BLADDER WITH ASSISTANCE Description: STG Manage Bladder With Assistance. Min assist Outcome: Progressing   Problem: RH SKIN INTEGRITY Goal: RH STG SKIN FREE OF INFECTION/BREAKDOWN Description: Free of breakdown and infection with Min assist Outcome: Progressing   Problem: RH SAFETY Goal: RH STG ADHERE TO SAFETY PRECAUTIONS W/ASSISTANCE/DEVICE Description: STG Adhere to Safety Precautions With Assistance/Device. min Outcome: Progressing   Problem: RH KNOWLEDGE DEFICIT Goal: RH STG INCREASE KNOWLEDGE OF STROKE PROPHYLAXIS Description: Patient/spouse will be able to verbalize measures to prevent stroke including medication and lifestyle management with cues/handouts Outcome: Progressing

## 2020-02-07 NOTE — Progress Notes (Signed)
Social Work Patient ID: Ruben Chavez, male   DOB: 1951/10/05, 69 y.o.   MRN: [id number]   SW returned phone call/left message for Leona Carry with [facility] [phone]) who reported that pt medical director (Dr. Amalia Hailey) was reviewing pt record, and wanted to know what was the plan of care for pt. SW informed waiting on updates from [facility] test and medical team to discuss best plan of care for pt going forward.   Loralee Pacas, MSW, [place] Office: [phone] Cell: [phone] Fax: [phone]

## 2020-02-07 NOTE — Progress Notes (Signed)
[facility] PHYSICAL MEDICINE & REHABILITATION PROGRESS NOTE   Subjective/Complaints:  Pt reports L hand still isn't moving- planned to do estim today.   Reports prostate CA was 3+ years ago, so can participate in estim- since been greater than 2 years.   Pt wants to go home "asap"- explained can go home in the next few days after training, but esp if COVID testing (+)- because won't expose him to [place] when he's negative if send him home.   If (-) will have to determine hospital policy- pt emphatic can stay away from wife at home, but isn't thinking about how needs some assistance currently.   Denies all resp Sx's, cough, sneezing, post nasal drip-SOB/DOE, and loss of taste and smell.    ROS: pt denies SOB, CP, abd pain, N/V/C/D  Objective:   No results found. Recent Labs    02/05/20 0915  WBC 8.2  HGB 10.7*  HCT 32.9*  PLT 465*   Recent Labs    02/05/20 0915  NA 137  K 3.7  CL 106  CO2 24  GLUCOSE 99  BUN 15  CREATININE 0.91  CALCIUM 8.7*    Intake/Output Summary (Last 24 hours) at 02/07/2020 1049 Last data filed at 02/07/2020 1017 Gross per 24 hour  Intake 942 ml  Output -  Net 942 ml    Labs not done yet are active!   Physical Exam: Vital Signs Blood pressure 133/78, pulse 62, temperature 97.8 F (36.6 C), temperature source Axillary, resp. rate 18, height 5\' 11"  (1.803 m), weight 82.3 kg, SpO2 100 %.  Physical Exam Constitutional: laying supine in bed; with mask pulled down to chin, on phone- CNA at bedside, NAD HEENT: conjugate gaze Cardiovascular: RRR Respiratory/Chest:no accessory muscle use; no resp distress; breathing comfortably.   GI/Abdomen: soft, NT, ND, (+)BS Ext: no clubbing, cyanosis-  L hand swelling stable with Ktape still in place Musculoskeletal:  LUE 3/5 in biceps, and triceps- can supinate and pronate, but no  hand/fingers yet.  General: Normal range of motion.  Cervical back:tight musculature in upper traps, levators and  scalenes as well as rhomboids B/L Skin: -rash on back looks bette-r less erythema/less red Rash on inner thighs- improved- almost gone  Psychiatric: still talkative   Assessment/Plan: 1. Functional deficits secondary to R MCA stroke with L hemiparesis which require 3+ hours per day of interdisciplinary therapy in a comprehensive inpatient rehab setting.  Physiatrist is providing close team supervision and 24 hour management of active medical problems listed below.  Physiatrist and rehab team continue to assess barriers to discharge/monitor patient progress toward functional and medical goals  Care Tool:  Bathing  Bathing activity did not occur: Refused Body parts bathed by patient: Chest, Left arm, Abdomen, Face, Front perineal area, Right upper leg, Left upper leg, Right lower leg, Left lower leg, Buttocks   Body parts bathed by helper: Right arm     Bathing assist Assist Level: Minimal Assistance - Patient > 75%     Upper Body Dressing/Undressing Upper body dressing Upper body dressing/undressing activity did not occur (including orthotics): Refused What is the patient wearing?: Pull over shirt    Upper body assist Assist Level: Set up assist    Lower Body Dressing/Undressing Lower body dressing    Lower body dressing activity did not occur: Refused What is the patient wearing?: Underwear/pull up, Pants     Lower body assist Assist for lower body dressing: Contact Guard/Touching assist     Toileting Toileting Toileting Activity  did not occur Landscape architect and hygiene only): N/A (no void or bm)  Toileting assist Assist for toileting: Contact Guard/Touching assist     Transfers Chair/bed transfer  Transfers assist     Chair/bed transfer assist level: Supervision/Verbal cueing     Locomotion Ambulation   Ambulation assist      Assist level: Supervision/Verbal cueing Assistive device: No Device Max distance: 45'   Walk 10 feet  activity   Assist     Assist level: Supervision/Verbal cueing Assistive device: No Device   Walk 50 feet activity   Assist    Assist level: Contact Guard/Touching assist Assistive device: No Device    Walk 150 feet activity   Assist Walk 150 feet activity did not occur: Safety/medical concerns(distance not tolerated today)  Assist level: Contact Guard/Touching assist Assistive device: No Device    Walk 10 feet on uneven surface  activity   Assist     Assist level: Contact Guard/Touching assist Assistive device: Other (comment)(no device)   Wheelchair     Assist Will patient use wheelchair at discharge?: No   Wheelchair activity did not occur: N/A         Wheelchair 50 feet with 2 turns activity    Assist            Wheelchair 150 feet activity     Assist          Blood pressure 133/78, pulse 62, temperature 97.8 F (36.6 C), temperature source Axillary, resp. rate 18, height 5\' 11"  (1.803 m), weight 82.3 kg, SpO2 100 %.  Medical Problem List and Plan: 1.Left side weakness with dysarthria and facial droopsecondary to right MCA infarction.Status post loop recorder placement  3/1- pt has new L biceps and triceps movement- over weekend- will con't OT for UB strengthening.   3/2- put on droplet precautions for possible COVID- has been exposed  3/3- has new pronation and supination on LUE -patient may shower -ELOS/Goals: 10-14 days, mod I 2. Antithrombotics: -DVT/anticoagulation:Lovenox -antiplatelet therapy: Aspirin 325 mg daily and Plavix 75 mg daily x3 months then aspirin alone 3. Pain Management:Tylenol as needed  2/23- pt took Norco at home- willing to try tramadol 50 mg q6 hours prn ordered- if not effective, will do Norco  2/25- less back pain  3/3- pain not an issue per pt.  4. Mood:Provide emotional support -antipsychotic agents: N/A 5. Neuropsych: This  patientiscapable of making decisions on hisown behalf. 6. Skin/Wound Care:Routine skin checks 7. Fluids/Electrolytes/Nutrition:Routine in and outs with follow-up chemistries  2/23- kidney function great and K+ is 3.7- con't to monitor  2/25- check tomorrow   3/1- labs in AM- Cr 0.95 today.   3/2- labs in process- pending- will address as comes back in.  8. Hypertension. Lisinopril 20 mg daily. Monitor with increased mobility  2/25- BP running 0000000 systolic- con't regimen  [id number] bp controlled  3/1- BP running 120s/70s- well controlled- con't regimen  3/3- BP running 110s/70s- well controlled- con't regimen.  9. Hyperlipidemia. Lipitor 10.Prostate cancerwith history of radioactive seed implant. Follow-up outpatient  3/4- can use estim since been 3+ years since treatment 11. Remote tobacco abuse. Continue to provide counseling 12. Constipation- LBM 5 days ago  2/23- will order Senokot-S 2 tabs qAM, and sorbitol and dulcolax suppository prn  2/24- had 2 BMs- feeling better 13. Neck tightness/myofascial pain  2/23- might benefit from trigger point injections if tramadol not effective  2/24- slept better with his tempurpedic pillow- will con't to monitor  2/26- notes  neck pain better- con't to monitor- can still do injections if needed  3/3- reports doesn't need injections 14/ Rashes- will try cortisone lotion for both and see how it helps BID;  2/26- helping both rashes- con't regime  3/2- Inner thigh rash gone- con't meds for back rash 15. LUE edema  2/25- concerned about DVT_ get Dopplers of LUE today and monitor Sx's.   2/26- Dopplers (-)- will get Koban to wrap LUE- by OT or PT- esp hand/fingers and encouraged pt to not let arm dangle.  2/28 reviewed massage and edema mgt techniques   3/1- pt's getting wrapped with kineseotape today to help swelling/edema.   3/2- hand looks 30% better today- con't Ktape  3/4- con't Ktape for swelling-  16. Depression  2/26- pt wants  ot restart Wellbutrin- will start 75 mg BID- low dose, esp because (although he knows it's worked) can lower seizure threshold slightly- will monitor for seizures.   2/27- happy to be on wellbutrin. Discussed the fact that his right cortical infarct may lead to emotional lability/PBA. Monitor for now. Might be a candidate for nuedexta if symptoms are ongoing or progressive.  17. COVID exposure-  3/1- will get COIVD test done asap- will wait for additional therapies today until we have COIVD test results.   3/2- wife, who came to visit pt, is (+) in spite of COVID vaccine (2nd one 2 days prior to exposure)- will keep on droplet precautions x 10 days minimum and test Thursday.  Spoke to infection control nurse- went over plan- x10 minutes Also explained not appropriate to drive for at least 2-3 months- will d/w team.   3/3- pt insists daughter to be off precautions as of 3/5- which doesn't correlate with [place] being told last Friday was the day she tested (+)- will need to figure this out for infection control to determine overall plan.   - also wait for pt's COVID test to determine plan   3/4- pt's getting COVID test today- will take up to 24 hours to get results. Is asymptomatic Will determine when can send pt home.   LOS: 10 days A FACE TO FACE EVALUATION WAS PERFORMED  Dilraj Killgore 02/07/2020, 10:49 AM

## 2020-02-07 NOTE — Progress Notes (Signed)
Physical Therapy Session Note  Patient Details  Name: Ruben Chavez MRN: [id number] Date of Birth: 1951-02-24  Today's Date: 02/07/2020 PT Individual Time: [id number] PT Individual Time Calculation (min): 60 min   Short Term Goals: Week 2:  PT Short Term Goal 1 (Week 2): STG=LTG due to ELOS.  Skilled Therapeutic Interventions/Progress Updates:     Patient in w/c in room upon PT arrival. Patient alert and agreeable to PT session. Patient denied pain during session. Discussed d/c planning and confirmed with Dr. Dagoberto Ligas about possible d/c plans pending test results.   Therapeutic Activity: Bed Mobility: Patient performed supine to/from sit with independently in a flat bed without use of bed rails.  Transfers: Patient performed sit to/from stand x6 with supervision for safety due to decreased balance and impulsivity.  Patient performed a floor transfer with CGA-close supervision. Demonstrated technique with increased use of R side over L due to hemiparetic L UE. Patient required mod-max cues for sequencing during transfer. Provided education on self-assessment in the event of a fall, situations in which it would be best for him or his family to call emergency services and to have assistance if he is safe to get up from the floor. Recommended crawling to furniture to assist and come to sitting rather than standing if he is safe to get up from the floor with assistance.   Gait Training:  Patient ambulated 10-20 feet in sandals around the room during session with distant supervision. Ambulated with decreased gait speed, decreased step length, and mild forward lean. He performed transfers from the bed and w/c as he would at home. He took off his sandals in standing and then picked them up to move them while holding the bed rail before sitting down. Educated on sitting to doff or don shoes rather than taking them off in standing for safety. Patient feels he could be independent in the room, discussed  concerns about patient's safety awareness and educated on fall prevention with mobility. Patient stated understanding.   Neuromuscular Re-ed: Patient performed the following R UE activities for increased motor control and activation: -elbow flexion/extension with light manual resistance 2x10 -wrist flexion/extension PROM with guided motor imagery 2x10 -shoulder flexion extension while maintaining elbow extension without support 2x10  Patient in w/c in room at end of session with breaks locked, seat belt alarm set, and all needs within reach.    Therapy Documentation Precautions:  Precautions Precautions: Fall Precaution Comments: L hemi UE>LE Restrictions Weight Bearing Restrictions: No    Therapy/Group: Individual Therapy  Kaytee Taliercio L Advait Buice PT, DPT  02/07/2020, 12:53 PM

## 2020-02-07 NOTE — Progress Notes (Signed)
Occupational Therapy Session Note  Patient Details  Name: Ruben Chavez MRN: [id number] Date of Birth: [birth year]  Today's Date: 02/07/2020 OT Individual Time: 1100-1202 OT Individual Time Calculation (min): 62 min    Short Term Goals: Week 2:  OT Short Term Goal 1 (Week 2): STG = LTG, overall S level  Skilled Therapeutic Interventions/Progress Updates:    Patient seated in w/c, alert and ready for therapy session.  He is requesting a shower this am.  Ambulation in room without AD CS.  toileting completed with CS.   Transfer to shower bench with CS, set up for shower, dressing completed with set up, grooming at sink mod I seated, DS in stance. Dependent for donning teds, able to donn sneakers with set up after providing and educating regarding use of elastic laces.  Patient remained seated in the w/c at close of session, seat belt alarm set and call bell in reach.    Therapy Documentation Precautions:  Precautions Precautions: Fall Precaution Comments: L hemi UE>LE Restrictions Weight Bearing Restrictions: No General:   Vital Signs:    Therapy/Group: Individual Therapy  Carlos Levering 02/07/2020, 7:33 AM

## 2020-02-07 NOTE — Progress Notes (Signed)
Speech Language Pathology Daily Session Note  Patient Details  Name: Ruben Chavez MRN: [id number] Date of Birth: 1951/11/14  Today's Date: 02/07/2020 SLP Individual Time: 1300-1355 SLP Individual Time Calculation (min): 55 min  Short Term Goals: Week 2: SLP Short Term Goal 1 (Week 2): STGs=LTGs due to ELOS  Skilled Therapeutic Interventions: Skilled treatment session focused on cognitive goals. SLP facilitated session by providing extra time and overall supervision level verbal cues for organization and problem solving during a complex calendar task. SLP also facilitated a conversation that focused on d/c planning and utilizing zoom to complete family education when appropriate. Patient in agreement.  Patient left upright in wheelchair with alarm on and all needs within reach. Continue with current plan of care.      Pain No/Denies Pain   Therapy/Group: Individual Therapy  Rosalea Withrow 02/07/2020, 3:18 PM

## 2020-02-07 NOTE — Progress Notes (Signed)
Late entry- Patient states it feels like his wrist is "bone on bone" Dan PA notified. No new orders given.

## 2020-02-08 ENCOUNTER — Inpatient Hospital Stay (HOSPITAL_COMMUNITY): Payer: PPO

## 2020-02-08 ENCOUNTER — Inpatient Hospital Stay (HOSPITAL_COMMUNITY): Payer: PPO | Admitting: Speech Pathology

## 2020-02-08 ENCOUNTER — Inpatient Hospital Stay (HOSPITAL_COMMUNITY): Payer: PPO | Admitting: Occupational Therapy

## 2020-02-08 NOTE — Progress Notes (Signed)
Patient is due for loop implant wound check. Loop implanted 01/28/2020 by Dr. Caryl Comes for cryptogenic stroke Pt had COVID exposure apparently by a visitor several days ago Yesterday's COVID test was negative and seem potentially planned for discharge in d/w staff  I saw the patient wearing both contact and droplet precaution PPE, patient was also wearing a mask Our time together was brief, <51minutes interrogation of his device noted no arrhythmias or observations Wound was dressed with a large tegaderm over the original dressings.  All were removed without difficulty Wound is well healed, no erythema, edema or increased heat to the surrounding tissues I did not see his transmitter plugged in the room, though the box was sitting on a table in the room I asked staff to make sure that if he does not go home today to please make sure the transmitter gets plugged in. I discussed the same with the patient, and importance of once home to make sure is plugged in at his bedside.  Tommye Standard, PA-C

## 2020-02-08 NOTE — Progress Notes (Signed)
Physical Therapy Session Note  Patient Details  Name: Ruben Chavez MRN: [id number] Date of Birth: 05-25-1951  Today's Date: 02/08/2020 PT Individual Time: 1100-1155 PT Individual Time Calculation (min): 55 min   Short Term Goals: Week 1:  PT Short Term Goal 1 (Week 1): Pt will perform wc to/from bed/mat w/LRAD and cga, minimal cues for safety PT Short Term Goal 1 - Progress (Week 1): Met PT Short Term Goal 2 (Week 1): Pt will perform bed mobility w/supervision attending to LUE positioining PT Short Term Goal 2 - Progress (Week 1): Met PT Short Term Goal 3 (Week 1): Pt will ambulate in home environment greater than 122f w/cga/LRAD PT Short Term Goal 3 - Progress (Week 1): Met Week 2:  PT Short Term Goal 1 (Week 2): STG=LTG due to ELOS.  Skilled Therapeutic Interventions/Progress Updates:    Pt seen bedside due to isolation precautions and COVID test.  Received pt sitting in WC, pt agreeable to therapy, and denied any pain during session. Pt stated he is waiting for the latest update from the MD regarding discharge date. Session focused on functional mobility/transfers, LE strength, dynamic standing balance, and improved endurance with activity. Pt transferred sit<>stand without UE support 2x10 with close supervision. Standing without AD pt performed ball toss against door using R UE and CGA for approximately 2 minutes x 2 trials varying from wide BOS to narrow BOS. Pt able to pick up ball from floor using R UE and CGA. Pt transferred sit<>stand at sink supervision. Pt brushed teeth independently but required min A to open mouthwash and squeeze toothpaste onto toothbrush. Pt performed 4x5 standing mini squats with bilateral UE support on sink CGA with manual facilitation to maintain L UE in weight bearing position. Pt required verbal and visual cues for technique. Pt performed the following exercises seated in WC: -hip adduction ball squeezes 2x15 with 5 second isometric hold.  -hip abduction  with green TB 2x15 with 3 second isometric hold. Pt required verbal cues for technique and eccentric control. Concluded session with pt sitting in WC, needs within reach, and seatbelt alarm on.   Therapy Documentation Precautions:  Precautions Precautions: Fall Precaution Comments: L hemi UE>LE Restrictions Weight Bearing Restrictions: No  Therapy/Group: Individual Therapy AAlfonse AlpersPT, DPT   02/08/2020, 7:31 AM

## 2020-02-08 NOTE — Plan of Care (Signed)
  Problem: Consults Goal: RH STROKE PATIENT EDUCATION Description: See Patient Education module for education specifics  Outcome: Progressing   Problem: RH BLADDER ELIMINATION Goal: RH STG MANAGE BLADDER WITH ASSISTANCE Description: STG Manage Bladder With Assistance. Min assist Outcome: Progressing   Problem: RH SKIN INTEGRITY Goal: RH STG SKIN FREE OF INFECTION/BREAKDOWN Description: Free of breakdown and infection with Min assist Outcome: Progressing   Problem: RH SAFETY Goal: RH STG ADHERE TO SAFETY PRECAUTIONS W/ASSISTANCE/DEVICE Description: STG Adhere to Safety Precautions With Assistance/Device. min Outcome: Progressing

## 2020-02-08 NOTE — Progress Notes (Signed)
Occupational Therapy Session Note  Patient Details  Name: Ruben Chavez MRN: [id number] Date of Birth: 16-Jul-1951  Today's Date: 02/08/2020 OT Individual Time: 1300-1350 OT Individual Time Calculation (min): 50 min    Short Term Goals: Week 1:  OT Short Term Goal 1 (Week 1): Pt will require no more than min cueing for use of hemi technique during dressing OT Short Term Goal 1 - Progress (Week 1): Met OT Short Term Goal 2 (Week 1): Pt will don shirt with min A OT Short Term Goal 2 - Progress (Week 1): Met OT Short Term Goal 3 (Week 1): Pt will don pants with min A OT Short Term Goal 3 - Progress (Week 1): Met OT Short Term Goal 4 (Week 1): Pt will transfer to toilet with LRAD with CGA OT Short Term Goal 4 - Progress (Week 1): Met Week 2:  OT Short Term Goal 1 (Week 2): STG = LTG, overall S level Week 3:     Skilled Therapeutic Interventions/Progress Updates:    1:1 Continued focus on self care retraining. Pt ambulated around the room without AD with contact guard. Pt still required supervision for safety with mobility and functional tasks; including recommendation to sit to doff pants instead of trying to step on them to try to step out of them and safety with left LE with repositioning in w/c (decr attention to UE - letting it hang. Pt able to bathe with min A with A to use left UE to wash right Ue. Pt reported he wanted to wear his soiled underwear since he didn't have clean ones- albe to encourage him to go wtihout and therapist would wash his for wearing it tomorrow. Pt able to dress shirt and shorts with setup.   Shaved dorsal portion of UE and applied SABO to promote wrist and finger extension . Run for 60 minutes after fit. Returned to retrieve it and no adverse reaction.  Saebo Stim One 330 pulse width 35 Hz pulse rate On 8 sec/ off 8 sec Ramp up/ down 2 sec Symmetrical Biphasic wave form  Max intensity 126m at 500 Ohm load    Therapy Documentation Precautions:   Precautions Precautions: Fall Precaution Comments: L hemi UE>LE Restrictions Weight Bearing Restrictions: No General:   Vital Signs: Therapy Vitals Temp: 97.7 F (36.5 C) Pulse Rate: 68 Resp: 18 BP: 134/64 Patient Position (if appropriate): Sitting Oxygen Therapy SpO2: 100 % O2 Device: Room Air Pain:  no c/o pain in session   Therapy/Group: Individual Therapy  SWilleen CassLBrand [facility] Institute3/04/2020, 6:00 PM

## 2020-02-08 NOTE — Progress Notes (Signed)
Physical Therapy Session Note  Patient Details  Name: Ruben Chavez MRN: [id number] Date of Birth: [birth year]  Today's Date: 02/08/2020 PT Individual Time: 0915-1000 PT Individual Time Calculation (min): 45 min   Short Term Goals: Week 1:  PT Short Term Goal 1 (Week 1): Pt will perform wc to/from bed/mat w/LRAD and cga, minimal cues for safety PT Short Term Goal 1 - Progress (Week 1): Met PT Short Term Goal 2 (Week 1): Pt will perform bed mobility w/supervision attending to LUE positioining PT Short Term Goal 2 - Progress (Week 1): Met PT Short Term Goal 3 (Week 1): Pt will ambulate in home environment greater than 115f w/cga/LRAD PT Short Term Goal 3 - Progress (Week 1): Met Week 2:  PT Short Term Goal 1 (Week 2): STG=LTG due to ELOS. Week 3:     Skilled Therapeutic Interventions/Progress Updates:    PAIN  Denies pain  Pt seen bedside due to isolation for pending COVID test.  Pt initially OOB in wc and agreeable to session focusing on LUE/ LLE NMRE and balance.  STS w/supervison.  Pt stood w/supervision and clasped ball between hands w/emphasis on compressing ball w/both hands.  Performed lebow flex/ext while grasping ball between hands x 10. MD in to remove dressing from chest, pt continued standing w/supervison during this.   Standing w/LUE wbing positioned w/palm down on nightstand while performing the following: Heel raises x 20 Marching x 20 Hip abd/add x 15 w/decreased coordination LLE. Hamstring curls x 15  cga w/balance during above therex  Alternating tapping 3inch step x 15 each w/cga Standing feet together eyes open/eyes closed, standing semi tandem eyes open/eyes closed w/mild increased sway to L.  cga for safety  Pt unable to achieve full tandem position due to LOB to L when attempting. Standing sidestepping to L and R, retrogait w/min to cga assist for balance.  Pt left oob in wc w/alarm belt set and needs in reach   Therapy Documentation Precautions:   Precautions Precautions: Fall Precaution Comments: L hemi UE>LE Restrictions Weight Bearing Restrictions: No   Therapy/Group: Individual Therapy  BCallie Fielding PLittle [place]3/04/2020, 4:03 PM

## 2020-02-08 NOTE — Progress Notes (Signed)
Speech Language Pathology Daily Session Note  Patient Details  Name: Ruben Chavez MRN: [id number] Date of Birth: 12-26-1950  Today's Date: 02/08/2020 SLP Individual Time: 0700-0730 SLP Individual Time Calculation (min): 30 min  Short Term Goals: Week 2: SLP Short Term Goal 1 (Week 2): STGs=LTGs due to ELOS  Skilled Therapeutic Interventions: Skilled treatment session focused on cognitive goals. SLP facilitated session by providing supervision level verbal cues to maximize safety with basic self-care tasks like ambulating to the bathroom, dressing and basic grooming while standing at the sink. Patient demonstrated sustained attention to all tasks with overall Mod I. Patient left upright in wheelchair with alarm on and all needs within reach. Continue with current plan of care.      Pain Pain Assessment Pain Scale: 0-10 Pain Score: 0-No pain  Therapy/Group: Individual Therapy  Shaunta Oncale 02/08/2020, 8:36 AM

## 2020-02-08 NOTE — Progress Notes (Signed)
Social Work Patient ID: Ruben Chavez, male   DOB: 01-14-1951, 69 y.o.   MRN: [id number]   Per medical team, pt tested negative for COVID, and have ethical concerns with patient discharging to home before 02/13/2020 as pt is high risk. SW to provide updates to insurance.   SW left message for [facility] [phone]) requesting return phone call to provide updates from medical team.   SW spoke with Leona Carry with [facility] [phone]) to discuss medical concerns above. Reports pt has options of SNF or ALF during his isolation time. SW discussed current medical issues, and pt high risk to be exposed to COVID due to medical issues. SW informed attending is willing to speak with medical director about concerns. Dr Daylene Posey [phone]. SW provided contact information to Dr. Dagoberto Ligas. *SW received updates from Dr. Dagoberto Ligas reporting pt was approved through Tuesday for therapy with d/c on Wednesday (3/10).   Loralee Pacas, MSW, [place] Office: [phone] Cell: [phone] Fax: [phone]

## 2020-02-08 NOTE — Progress Notes (Signed)
[place] PHYSICAL MEDICINE & REHABILITATION PROGRESS NOTE   Subjective/Complaints:   Pt's first COVID test was negative.   However per ID/infeciton control, need to keep on droplet and contact precautions until 10 days from exposure- which is at the latest, March 10 (wife's test was 3/1- exposed 2/27 and 2/28).   Pt denies any resp Sx's, abd pain, N/V/C/D and denies loss of smell and taste.   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   LBM yesterday.   ROS: pt denies SOB, CP, abd pain.  Objective:   No results found. No results for input(s): WBC, HGB, HCT, PLT in the last 72 hours. No results for input(s): NA, K, CL, CO2, GLUCOSE, BUN, CREATININE, CALCIUM in the last 72 hours.  Intake/Output Summary (Last 24 hours) at 02/08/2020 1006 Last data filed at 02/08/2020 0807 Gross per 24 hour  Intake 1026 ml  Output -  Net 1026 ml    Labs not done yet are active!   Physical Exam: Vital Signs Blood pressure 136/75, pulse 67, temperature 97.9 F (36.6 C), temperature source Oral, resp. rate 16, height 5\' 11"  (1.803 m), weight 82.3 kg, SpO2 100 %.  Physical Exam Constitutional: awake, alert, more appropriate, mask in place except for ~ 30 seconds came down around nose; NAD  HEENT: conjugate gaze Cardiovascular: no JVD Respiratory/Chest: no resp distress; Clear- no W/R/R GI/Abdomen: soft, nondistended;  Ext: no clubbing, cyanosis-  L hand swelling is down substantially to ~ trace and K tape is off.  Musculoskeletal:  LUE 3/5 in biceps, and triceps- can supinate and pronate, still no hands or finger movement.  Has grinding sound with constant movement of L wrist- sounds like arthritic changes General: Normal range of motion.  Cervical back:not as tight neck/shoulder muscles Skin: -rash on back looks bette-r less erythema/less red Rash on inner thighs- improved- almost gone   Psychiatric:talkative- but appropriate   Assessment/Plan: 1. Functional deficits secondary to R MCA stroke with L hemiparesis which require 3+ hours per day of interdisciplinary therapy in a comprehensive inpatient rehab setting.  Physiatrist is providing close team supervision and 24 hour management of active medical problems listed below.  Physiatrist and rehab team continue to assess barriers to discharge/monitor patient progress toward functional and medical goals  Care Tool:  Bathing  Bathing activity did not occur: Refused Body parts bathed by patient: Chest, Left arm, Abdomen, Face, Front perineal area, Right upper leg, Left upper leg, Right lower leg, Left lower leg, Buttocks, Right arm   Body parts bathed by helper: Right arm     Bathing assist Assist Level: Set up assist     Upper Body Dressing/Undressing Upper body dressing Upper body dressing/undressing activity did not occur (including orthotics): Refused What is the patient wearing?: Pull over shirt    Upper body assist Assist Level: Independent with assistive device    Lower Body Dressing/Undressing Lower body dressing    Lower body dressing activity did not occur: Refused What is the patient wearing?: Underwear/pull up, Pants     Lower body assist Assist for lower body dressing: Supervision/Verbal cueing     Toileting Toileting Toileting Activity did not occur (Clothing management and hygiene only): N/A (no void or bm)  Toileting assist Assist for toileting: Supervision/Verbal cueing     Transfers Chair/bed transfer  Transfers assist     Chair/bed transfer assist level: Supervision/Verbal cueing  Locomotion Ambulation   Ambulation assist      Assist level: Supervision/Verbal cueing Assistive device: No Device Max distance: 25'   Walk 10 feet activity   Assist     Assist level: Supervision/Verbal cueing Assistive device: No Device   Walk 50 feet activity   Assist     Assist level: Contact Guard/Touching assist Assistive device: No Device    Walk 150 feet activity   Assist Walk 150 feet activity did not occur: Safety/medical concerns(distance not tolerated today)  Assist level: Contact Guard/Touching assist Assistive device: No Device    Walk 10 feet on uneven surface  activity   Assist     Assist level: Contact Guard/Touching assist Assistive device: Other (comment)(no device)   Wheelchair     Assist Will patient use wheelchair at discharge?: No   Wheelchair activity did not occur: N/A         Wheelchair 50 feet with 2 turns activity    Assist            Wheelchair 150 feet activity     Assist          Blood pressure 136/75, pulse 67, temperature 97.9 F (36.6 C), temperature source Oral, resp. rate 16, height 5\' 11"  (1.803 m), weight 82.3 kg, SpO2 100 %.  Medical Problem List and Plan: 1.Left side weakness with dysarthria and facial droopsecondary to right MCA infarction.Status post loop recorder placement  3/1- pt has new L biceps and triceps movement- over weekend- will con't OT for UB strengthening.   3/2- put on droplet precautions for possible COVID- has been exposed  3/3- has new pronation and supination on LUE  3/5- can use Estim for hand/finger movement- still has difficulties -patient may shower -ELOS/Goals: 10-14 days, mod I 2. Antithrombotics: -DVT/anticoagulation:Lovenox -antiplatelet therapy: Aspirin 325 mg daily and Plavix 75 mg daily x3 months then aspirin alone 3. Pain Management:Tylenol as needed  2/23- pt took Norco at home- willing to try tramadol 50 mg q6 hours prn ordered- if not effective, will do Norco  2/25- less back pain  3/3- pain not an issue per pt.  4. Mood:Provide emotional support -antipsychotic agents: N/A 5. Neuropsych: This patientiscapable of making decisions on hisown behalf. 6. Skin/Wound  Care:Routine skin checks 7. Fluids/Electrolytes/Nutrition:Routine in and outs with follow-up chemistries  2/23- kidney function great and K+ is 3.7- con't to monitor  2/25- check tomorrow   3/1- labs in AM- Cr 0.95 today.   3/2- labs in process- pending- will address as comes back in.  3/5- labs for Monday ordered to f/u  8. Hypertension. Lisinopril 20 mg daily. Monitor with increased mobility  2/25- BP running 0000000 systolic- con't regimen  [id number] bp controlled  3/1- BP running 120s/70s- well controlled- con't regimen  3/3- BP running 110s/70s- well controlled- con't regimen.   3/5- BP 130s/70s- con't regimen 9. Hyperlipidemia. Lipitor 10.Prostate cancerwith history of radioactive seed implant. Follow-up outpatient  3/4- can use estim since been 3+ years since treatment 11. Remote tobacco abuse. Continue to provide counseling 12. Constipation- LBM 5 days ago  2/23- will order Senokot-S 2 tabs qAM, and sorbitol and dulcolax suppository prn  2/24- had 2 BMs- feeling better 13. Neck tightness/myofascial pain  2/23- might benefit from trigger point injections if tramadol not effective  2/24- slept better with his tempurpedic pillow- will con't to monitor  2/26- notes neck pain better- con't to monitor- can still do injections if needed  3/3- reports doesn't need injections 14/ Rashes- will  try cortisone lotion for both and see how it helps BID;  2/26- helping both rashes- con't regime  3/2- Inner thigh rash gone- con't meds for back rash 15. LUE edema  2/25- concerned about DVT_ get Dopplers of LUE today and monitor Sx's.   2/26- Dopplers (-)- will get Koban to wrap LUE- by OT or PT- esp hand/fingers and encouraged pt to not let arm dangle.  2/28 reviewed massage and edema mgt techniques   3/1- pt's getting wrapped with kineseotape today to help swelling/edema.   3/2- hand looks 30% better today- con't Ktape  3/4- con't Ktape for swelling-   3/5- almost gone- con't Ktape as  needed 16. Depression  2/26- pt wants ot restart Wellbutrin- will start 75 mg BID- low dose, esp because (although he knows it's worked) can lower seizure threshold slightly- will monitor for seizures.   2/27- happy to be on wellbutrin. Discussed the fact that his right cortical infarct may lead to emotional lability/PBA. Monitor for now. Might be a candidate for nuedexta if symptoms are ongoing or progressive.  17. COVID exposure-  3/1- will get COIVD test done asap- will wait for additional therapies today until we have COIVD test results.   3/2- wife, who came to visit pt, is (+) in spite of COVID vaccine (2nd one 2 days prior to exposure)- will keep on droplet precautions x 10 days minimum and test Thursday.  Spoke to infection control nurse- went over plan- x10 minutes Also explained not appropriate to drive for at least 2-3 months- will d/w team.   3/3- pt insists daughter to be off precautions as of 3/5- which doesn't correlate with [place] being told last Friday was the day she tested (+)- will need to figure this out for infection control to determine overall plan.   - also wait for pt's COVID test to determine plan   3/4- pt's getting COVID test today- will take up to 24 hours to get results. Is asymptomatic Will determine when can send pt home.  3/5- COVID test (-)- still needs another test Monday per ID/infection control- insurance ants pt to go to SNF- a SNF wouldn't take him when he could be COVID (+) up til the 10 days mark. Likely here until March 10th.  18/ L wrist pain  3/5- sounds and feels like arthritic changes- d/w pt can get xray in future, but I don't think it's anything concerning or bad- just signs of losing cartilage- pt voiced understanding.    LOS: 11 days A FACE TO FACE EVALUATION WAS PERFORMED  Ruben Chavez 02/08/2020, 10:06 AM

## 2020-02-09 NOTE — Progress Notes (Signed)
[place] PHYSICAL MEDICINE & REHABILITATION PROGRESS NOTE   Subjective/Complaints: Listening to prayer for him on phone. Denies pain.  Had BM this AM.  Sleeping well at night.    ROS: pt denies SOB, CP, abd pain.  Objective:   No results found. No results for input(s): WBC, HGB, HCT, PLT in the last 72 hours. No results for input(s): NA, K, CL, CO2, GLUCOSE, BUN, CREATININE, CALCIUM in the last 72 hours.  Intake/Output Summary (Last 24 hours) at 02/09/2020 1357 Last data filed at 02/09/2020 1346 Gross per 24 hour  Intake 480 ml  Output --  Net 480 ml    Labs not done yet are active!   Physical Exam: Vital Signs Blood pressure 118/69, pulse 69, temperature 97.9 F (36.6 C), resp. rate 18, height 5\' 11"  (1.803 m), weight 82.3 kg, SpO2 100 %.  Physical Exam Constitutional: awake, alert, more appropriate, speaking on phone with pastor HEENT: conjugate gaze Cardiovascular: no JVD Respiratory/Chest: no resp distress; Clear- no W/R/R GI/Abdomen: soft, nondistended;  Ext: no clubbing, cyanosis-  L hand swelling is down substantially to ~ trace and K tape is off.  Musculoskeletal:  LUE 3/5 in biceps, and triceps- can supinate and pronate, still no hands or finger movement.  Has grinding sound with constant movement of L wrist- sounds like arthritic changes General: Normal range of motion.  Cervical back:not as tight neck/shoulder muscles Skin: -rash on back looks bette-r less erythema/less red Rash on inner thighs- improved- almost gone  Psychiatric:talkative- but appropriate  Assessment/Plan: 1. Functional deficits secondary to R MCA stroke with L hemiparesis which require 3+ hours per day of interdisciplinary therapy in a comprehensive inpatient rehab setting.  Physiatrist is providing close team supervision and 24 hour management of active medical problems listed below.  Physiatrist and rehab team continue to assess barriers to discharge/monitor patient  progress toward functional and medical goals  Care Tool:  Bathing  Bathing activity did not occur: Refused Body parts bathed by patient: Chest, Left arm, Abdomen, Face, Front perineal area, Right upper leg, Left upper leg, Right lower leg, Left lower leg, Buttocks, Right arm   Body parts bathed by helper: Right arm     Bathing assist Assist Level: Minimal Assistance - Patient > 75%     Upper Body Dressing/Undressing Upper body dressing Upper body dressing/undressing activity did not occur (including orthotics): Refused What is the patient wearing?: Pull over shirt    Upper body assist Assist Level: Contact Guard/Touching assist    Lower Body Dressing/Undressing Lower body dressing    Lower body dressing activity did not occur: Refused What is the patient wearing?: Pants     Lower body assist Assist for lower body dressing: Minimal Assistance - Patient > 75%     Toileting Toileting Toileting Activity did not occur (Clothing management and hygiene only): N/A (no void or bm)  Toileting assist Assist for toileting: Supervision/Verbal cueing     Transfers Chair/bed transfer  Transfers assist     Chair/bed transfer assist level: Supervision/Verbal cueing     Locomotion Ambulation   Ambulation assist      Assist level: Supervision/Verbal cueing Assistive device: No Device Max distance: 25'   Walk 10 feet activity   Assist     Assist level: Supervision/Verbal cueing Assistive device: No Device   Walk 50 feet activity   Assist    Assist level: Contact Guard/Touching assist Assistive device: No Device    Walk 150 feet activity   Assist Walk 150 feet  activity did not occur: Safety/medical concerns(distance not tolerated today)  Assist level: Contact Guard/Touching assist Assistive device: No Device    Walk 10 feet on uneven surface  activity   Assist     Assist level: Contact Guard/Touching assist Assistive device: Other (comment)(no  device)   Wheelchair     Assist Will patient use wheelchair at discharge?: No Type of Wheelchair: Manual Wheelchair activity did not occur: N/A         Wheelchair 50 feet with 2 turns activity    Assist            Wheelchair 150 feet activity     Assist          Blood pressure 118/69, pulse 69, temperature 97.9 F (36.6 C), resp. rate 18, height 5\' 11"  (1.803 m), weight 82.3 kg, SpO2 100 %.  Medical Problem List and Plan: 1.Left side weakness with dysarthria and facial droopsecondary to right MCA infarction.Status post loop recorder placement  3/1- pt has new L biceps and triceps movement- over weekend- will con't OT for UB strengthening.   3/2- put on droplet precautions for possible COVID- has been exposed  3/3- has new pronation and supination on LUE  3/5- can use Estim for hand/finger movement- still has difficulties -patient may shower -ELOS/Goals: 10-14 days, mod I  Continue CIR PT and OT 2. Antithrombotics: -DVT/anticoagulation:Lovenox -antiplatelet therapy: Aspirin 325 mg daily and Plavix 75 mg daily x3 months then aspirin alone 3. Pain Management:Tylenol as needed  2/23- pt took Norco at home- willing to try tramadol 50 mg q6 hours prn ordered- if not effective, will do Norco  2/25- less back pain  3/3- pain not an issue per pt.   3/6: pain well controlled.  4. Mood:Provide emotional support -antipsychotic agents: N/A 5. Neuropsych: This patientiscapable of making decisions on hisown behalf. 6. Skin/Wound Care:Routine skin checks 7. Fluids/Electrolytes/Nutrition:Routine in and outs with follow-up chemistries  2/23- kidney function great and K+ is 3.7- con't to monitor  2/25- check tomorrow   3/1- labs in AM- Cr 0.95 today.   3/2- labs in process- pending- will address as comes back in.  3/5- labs for Monday ordered to f/u  8. Hypertension. Lisinopril 20 mg daily. Monitor with  increased mobility  2/25- BP running 0000000 systolic- con't regimen  [id number] bp controlled  3/1- BP running 120s/70s- well controlled- con't regimen  3/3- BP running 110s/70s- well controlled- con't regimen.   3/5- BP 130s/70s- con't regimen  3/6: well controllled.  9. Hyperlipidemia. Lipitor 10.Prostate cancerwith history of radioactive seed implant. Follow-up outpatient  3/4- can use estim since been 3+ years since treatment 11. Remote tobacco abuse. Continue to provide counseling 12. Constipation- LBM 5 days ago  2/23- will order Senokot-S 2 tabs qAM, and sorbitol and dulcolax suppository prn  2/24- had 2 BMs- feeling better  3/6: having regular BM 13. Neck tightness/myofascial pain  2/23- might benefit from trigger point injections if tramadol not effective  2/24- slept better with his tempurpedic pillow- will con't to monitor  2/26- notes neck pain better- con't to monitor- can still do injections if needed  3/3- reports doesn't need injections 14/ Rashes- will try cortisone lotion for both and see how it helps BID;  2/26- helping both rashes- con't regime  3/2- Inner thigh rash gone- con't meds for back rash 15. LUE edema  2/25- concerned about DVT_ get Dopplers of LUE today and monitor Sx's.   2/26- Dopplers (-)- will get Koban to wrap LUE-  by OT or PT- esp hand/fingers and encouraged pt to not let arm dangle.  2/28 reviewed massage and edema mgt techniques   3/1- pt's getting wrapped with kineseotape today to help swelling/edema.   3/2- hand looks 30% better today- con't Ktape  3/4- con't Ktape for swelling-   3/5- almost gone- con't Ktape as needed 16. Depression  2/26- pt wants ot restart Wellbutrin- will start 75 mg BID- low dose, esp because (although he knows it's worked) can lower seizure threshold slightly- will monitor for seizures.   2/27- happy to be on wellbutrin. Discussed the fact that his right cortical infarct may lead to emotional lability/PBA. Monitor for  now. Might be a candidate for nuedexta if symptoms are ongoing or progressive.  17. COVID exposure-  3/1- will get COIVD test done asap- will wait for additional therapies today until we have COIVD test results.   3/2- wife, who came to visit pt, is (+) in spite of COVID vaccine (2nd one 2 days prior to exposure)- will keep on droplet precautions x 10 days minimum and test Thursday.  Spoke to infection control nurse- went over plan- x10 minutes Also explained not appropriate to drive for at least 2-3 months- will d/w team.   3/3- pt insists daughter to be off precautions as of 3/5- which doesn't correlate with [place] being told last Friday was the day she tested (+)- will need to figure this out for infection control to determine overall plan.   - also wait for pt's COVID test to determine plan   3/4- pt's getting COVID test today- will take up to 24 hours to get results. Is asymptomatic Will determine when can send pt home.  3/5- COVID test (-)- still needs another test Monday per ID/infection control- insurance ants pt to go to SNF- a SNF wouldn't take him when he could be COVID (+) up til the 10 days mark. Likely here until March 10th.  18/ L wrist pain  3/5- sounds and feels like arthritic changes- d/w pt can get xray in future, but I don't think it's anything concerning or bad- just signs of losing cartilage- pt voiced understanding.    LOS: 12 days A FACE TO FACE EVALUATION WAS PERFORMED  Clide Deutscher Kippy Gohman 02/09/2020, 1:57 PM

## 2020-02-10 ENCOUNTER — Inpatient Hospital Stay (HOSPITAL_COMMUNITY): Payer: PPO

## 2020-02-10 NOTE — Progress Notes (Signed)
[place] PHYSICAL MEDICINE & REHABILITATION PROGRESS NOTE   Subjective/Complaints: Denies pain.  Having regular BM Sleeping well at night.  Wearing mask when providers in room.   ROS: pt denies SOB, CP, abd pain.  Objective:   No results found. No results for input(s): WBC, HGB, HCT, PLT in the last 72 hours. No results for input(s): NA, K, CL, CO2, GLUCOSE, BUN, CREATININE, CALCIUM in the last 72 hours.  Intake/Output Summary (Last 24 hours) at 02/10/2020 1049 Last data filed at 02/10/2020 0700 Gross per 24 hour  Intake 660 ml  Output --  Net 660 ml    Labs not done yet are active!   Physical Exam: Vital Signs Blood pressure 119/62, pulse 71, temperature 97.7 F (36.5 C), temperature source Oral, resp. rate 20, height 5\' 11"  (1.803 m), weight 82.3 kg, SpO2 97 %.  Physical Exam Constitutional: awake, alert, more appropriate, very polite and comfortable.  HEENT: conjugate gaze Cardiovascular: no JVD Respiratory/Chest: no resp distress; Clear- no W/R/R GI/Abdomen: soft, nondistended;  Ext: no clubbing, cyanosis-  L hand swelling is down substantially to ~ trace and K tape is off.  Musculoskeletal:  LUE 3/5 in biceps, and triceps- can supinate and pronate, still no hands or finger movement.  Has grinding sound with constant movement of L wrist- sounds like arthritic changes General: Normal range of motion.  Cervical back:not as tight neck/shoulder muscles Skin: -rash on back looks bette-r less erythema/less red Rash on inner thighs- improved- almost gone  Psychiatric:talkative- but appropriate  Assessment/Plan: 1. Functional deficits secondary to R MCA stroke with L hemiparesis which require 3+ hours per day of interdisciplinary therapy in a comprehensive inpatient rehab setting.  Physiatrist is providing close team supervision and 24 hour management of active medical problems listed below.  Physiatrist and rehab team continue to assess barriers to  discharge/monitor patient progress toward functional and medical goals  Care Tool:  Bathing  Bathing activity did not occur: Refused Body parts bathed by patient: Chest, Left arm, Abdomen, Face, Front perineal area, Right upper leg, Left upper leg, Right lower leg, Left lower leg, Buttocks, Right arm   Body parts bathed by helper: Right arm     Bathing assist Assist Level: Minimal Assistance - Patient > 75%     Upper Body Dressing/Undressing Upper body dressing Upper body dressing/undressing activity did not occur (including orthotics): Refused What is the patient wearing?: Pull over shirt    Upper body assist Assist Level: Contact Guard/Touching assist    Lower Body Dressing/Undressing Lower body dressing    Lower body dressing activity did not occur: Refused What is the patient wearing?: Pants     Lower body assist Assist for lower body dressing: Minimal Assistance - Patient > 75%     Toileting Toileting Toileting Activity did not occur (Clothing management and hygiene only): N/A (no void or bm)  Toileting assist Assist for toileting: Supervision/Verbal cueing     Transfers Chair/bed transfer  Transfers assist     Chair/bed transfer assist level: Supervision/Verbal cueing     Locomotion Ambulation   Ambulation assist      Assist level: Supervision/Verbal cueing Assistive device: No Device Max distance: 25'   Walk 10 feet activity   Assist     Assist level: Supervision/Verbal cueing Assistive device: No Device   Walk 50 feet activity   Assist    Assist level: Contact Guard/Touching assist Assistive device: No Device    Walk 150 feet activity   Assist Walk 150 feet  activity did not occur: Safety/medical concerns(distance not tolerated today)  Assist level: Contact Guard/Touching assist Assistive device: No Device    Walk 10 feet on uneven surface  activity   Assist     Assist level: Contact Guard/Touching assist Assistive  device: Other (comment)(no device)   Wheelchair     Assist Will patient use wheelchair at discharge?: No Type of Wheelchair: Manual Wheelchair activity did not occur: N/A         Wheelchair 50 feet with 2 turns activity    Assist            Wheelchair 150 feet activity     Assist          Blood pressure 119/62, pulse 71, temperature 97.7 F (36.5 C), temperature source Oral, resp. rate 20, height 5\' 11"  (1.803 m), weight 82.3 kg, SpO2 97 %.  Medical Problem List and Plan: 1.Left side weakness with dysarthria and facial droopsecondary to right MCA infarction.Status post loop recorder placement  3/1- pt has new L biceps and triceps movement- over weekend- will con't OT for UB strengthening.   3/2- put on droplet precautions for possible COVID- has been exposed  3/3- has new pronation and supination on LUE  3/5- can use Estim for hand/finger movement- still has difficulties -patient may shower -ELOS/Goals: 10-14 days, mod I  Continue CIR PT and OT 2. Antithrombotics: -DVT/anticoagulation:Lovenox -antiplatelet therapy: Aspirin 325 mg daily and Plavix 75 mg daily x3 months then aspirin alone 3. Pain Management:Tylenol as needed  2/23- pt took Norco at home- willing to try tramadol 50 mg q6 hours prn ordered- if not effective, will do Norco  2/25- less back pain  3/3- pain not an issue per pt.   3/6, 3/7: pain well controlled.  4. Mood:Provide emotional support -antipsychotic agents: N/A 5. Neuropsych: This patientiscapable of making decisions on hisown behalf. 6. Skin/Wound Care:Routine skin checks 7. Fluids/Electrolytes/Nutrition:Routine in and outs with follow-up chemistries  2/23- kidney function great and K+ is 3.7- con't to monitor  2/25- check tomorrow   3/1- labs in AM- Cr 0.95 today.   3/2- labs in process- pending- will address as comes back in.  3/5- labs for Monday ordered to f/u  8.  Hypertension. Lisinopril 20 mg daily. Monitor with increased mobility  2/25- BP running 0000000 systolic- con't regimen  [id number] bp controlled  3/1- BP running 120s/70s- well controlled- con't regimen  3/3- BP running 110s/70s- well controlled- con't regimen.   3/5- BP 130s/70s- con't regimen  3/6, 3/7: well controllled. Continue current regimen. 9. Hyperlipidemia. Lipitor 10.Prostate cancerwith history of radioactive seed implant. Follow-up outpatient  3/4- can use estim since been 3+ years since treatment 11. Remote tobacco abuse. Continue to provide counseling 12. Constipation- LBM 5 days ago  2/23- will order Senokot-S 2 tabs qAM, and sorbitol and dulcolax suppository prn  2/24- had 2 BMs- feeling better  3/6: having regular BM 13. Neck tightness/myofascial pain  2/23- might benefit from trigger point injections if tramadol not effective  2/24- slept better with his tempurpedic pillow- will con't to monitor  2/26- notes neck pain better- con't to monitor- can still do injections if needed  3/3- reports doesn't need injections 14/ Rashes- will try cortisone lotion for both and see how it helps BID;  2/26- helping both rashes- con't regime  3/2- Inner thigh rash gone- con't meds for back rash 15. LUE edema  2/25- concerned about DVT_ get Dopplers of LUE today and monitor Sx's.   2/26- Dopplers (-)-  will get Koban to wrap LUE- by OT or PT- esp hand/fingers and encouraged pt to not let arm dangle.  2/28 reviewed massage and edema mgt techniques   3/1- pt's getting wrapped with kineseotape today to help swelling/edema.   3/2- hand looks 30% better today- con't Ktape  3/4- con't Ktape for swelling-   3/5- almost gone- con't Ktape as needed 16. Depression  2/26- pt wants ot restart Wellbutrin- will start 75 mg BID- low dose, esp because (although he knows it's worked) can lower seizure threshold slightly- will monitor for seizures.   2/27- happy to be on wellbutrin. Discussed the  fact that his right cortical infarct may lead to emotional lability/PBA. Monitor for now. Might be a candidate for nuedexta if symptoms are ongoing or progressive.  17. COVID exposure-  3/1- will get COIVD test done asap- will wait for additional therapies today until we have COIVD test results.   3/2- wife, who came to visit pt, is (+) in spite of COVID vaccine (2nd one 2 days prior to exposure)- will keep on droplet precautions x 10 days minimum and test Thursday.  Spoke to infection control nurse- went over plan- x10 minutes Also explained not appropriate to drive for at least 2-3 months- will d/w team.   3/3- pt insists daughter to be off precautions as of 3/5- which doesn't correlate with [place] being told last Friday was the day she tested (+)- will need to figure this out for infection control to determine overall plan.   - also wait for pt's COVID test to determine plan   3/4- pt's getting COVID test today- will take up to 24 hours to get results. Is asymptomatic Will determine when can send pt home.  3/5- COVID test (-)- still needs another test Monday per ID/infection control- insurance ants pt to go to SNF- a SNF wouldn't take him when he could be COVID (+) up til the 10 days mark. Likely here until March 10th.  18/ L wrist pain  3/5- sounds and feels like arthritic changes- d/w pt can get xray in future, but I don't think it's anything concerning or bad- just signs of losing cartilage- pt voiced understanding.    LOS: 13 days A FACE TO FACE EVALUATION WAS PERFORMED  Clide Deutscher Imari Sivertsen 02/10/2020, 10:49 AM

## 2020-02-10 NOTE — Progress Notes (Addendum)
Physical Therapy Session Note  Patient Details  Name: Ruben Chavez MRN: [id number] Date of Birth: [birth year]  Today's Date: 02/10/2020 PT Individual Time: 1440-1605 PT Individual Time Calculation (min): 85 min   Short Term Goals: Week 2:  PT Short Term Goal 1 (Week 2): STG=LTG due to ELOS.  Skilled Therapeutic Interventions/Progress Updates:     Patient in w/c in room upon PT arrival. Patient alert and agreeable to PT session. Patient denied pain during session. Patient had several questions regarding stoke risk factors and modifications. PT provided education on individual risk factors for stroke, behavior modifications, dietary changes, exercise promotion, cessation of alcohol consumption while balance remains impaired for reduced fall risk. Also educated patient on types of stroke, general brain anatomy, signs and symptoms of stroke using "BE FAST" acronym written on a piece of paper in patient's room. Patient had very appropriate questions and encouraged patient to continue asking about this and to follow-up with the same questions with his doctors. Patient in agreement and appreciative of all education.   Therapeutic Activity: Transfers: Patient performed sit to/from stand x6 with supervision-mod I without an Ad. Provided verbal cues for planting feet before standing to reduce LOB when standing.  Gait Training:  Patient ambulated >300 feet, paced 20 ft straight path in the room performing 180 degree turns, without and AD with supervision for gait training and endurance. Ambulated with decreased gait speed, decreased B step length, decreased L arm swing, and slowed speed on turns. Provided verbal cues for increased gait speed for endurance, increased scapular activation to support L UE, increased L arm swing, and erect posture. Patient also ambulated around the room throughout session, as above.   Neuromuscular Re-ed: Patient performed the following L UE motor control, dynamic balance,  and dynamic gait activities: -L UE moving ball on the wall for scapular and upper arm motor control in a weight bearing position. PT provided approximation at the wrist for increased proprioceptive feedback and weight bearing. Performed up/down, L/R, and circles clockwise and counter-clockwise x2 -Tandem stance in the corner x2, R: 21 and 28 sec, L: 18 and 17 sec -Staggered stance catching a ball with R UE 2x1 min -side stepping 20 ft x4 -backwards walking 20 ft x4 -tandem walking 20 ft x2 progressing from min A-CGA  Therapeutic Exercise: Patient performed the following exercises with verbal and tactile cues for proper technique. -sit to/from stand x10 with focus on speed for strengthening and endurance Trial 1: 30.2 sec Trial 2: 28.3 sec  Patient in w/c in room at end of session with breaks locked, seat belt alarm set, and all needs within reach. Patient remained in the room with mask donned and contact isolation precautions followed throughout session.      Therapy Documentation Precautions:  Precautions Precautions: Fall Precaution Comments: L hemi UE>LE Restrictions Weight Bearing Restrictions: No    Therapy/Group: Individual Therapy  Ruben Chavez PT, DPT  02/10/2020, 4:49 PM

## 2020-02-11 ENCOUNTER — Inpatient Hospital Stay (HOSPITAL_COMMUNITY): Payer: PPO

## 2020-02-11 ENCOUNTER — Inpatient Hospital Stay (HOSPITAL_COMMUNITY): Payer: PPO | Admitting: Occupational Therapy

## 2020-02-11 ENCOUNTER — Inpatient Hospital Stay (HOSPITAL_COMMUNITY): Payer: PPO | Admitting: Speech Pathology

## 2020-02-11 LAB — CBC WITH DIFFERENTIAL/PLATELET
Abs Immature Granulocytes: 0.02 10*3/uL (ref 0.00–0.07)
Basophils Absolute: 0.1 10*3/uL (ref 0.0–0.1)
Basophils Relative: 1 %
Eosinophils Absolute: 0.4 10*3/uL (ref 0.0–0.5)
Eosinophils Relative: 7 %
HCT: 32.3 % — ABNORMAL LOW (ref 39.0–52.0)
Hemoglobin: 10.7 g/dL — ABNORMAL LOW (ref 13.0–17.0)
Immature Granulocytes: 0 %
Lymphocytes Relative: 19 %
Lymphs Abs: 1.1 10*3/uL (ref 0.7–4.0)
MCH: 31 pg (ref 26.0–34.0)
MCHC: 33.1 g/dL (ref 30.0–36.0)
MCV: 93.6 fL (ref 80.0–100.0)
Monocytes Absolute: 0.6 10*3/uL (ref 0.1–1.0)
Monocytes Relative: 9 %
Neutro Abs: 3.9 10*3/uL (ref 1.7–7.7)
Neutrophils Relative %: 64 %
Platelets: 471 10*3/uL — ABNORMAL HIGH (ref 150–400)
RBC: 3.45 MIL/uL — ABNORMAL LOW (ref 4.22–5.81)
RDW: 13.4 % (ref 11.5–15.5)
WBC: 6 10*3/uL (ref 4.0–10.5)
nRBC: 0 % (ref 0.0–0.2)

## 2020-02-11 LAB — BASIC METABOLIC PANEL
Anion gap: 8 (ref 5–15)
BUN: 16 mg/dL (ref 8–23)
CO2: 24 mmol/L (ref 22–32)
Calcium: 8.9 mg/dL (ref 8.9–10.3)
Chloride: 108 mmol/L (ref 98–111)
Creatinine, Ser: 0.89 mg/dL (ref 0.61–1.24)
GFR calc Af Amer: >60 mL/min (ref >60)
GFR calc non Af Amer: >60 mL/min (ref >60)
Glucose, Bld: 96 mg/dL (ref 70–99)
Potassium: 3.8 mmol/L (ref 3.5–5.1)
Sodium: 140 mmol/L (ref 135–145)

## 2020-02-11 LAB — SARS CORONAVIRUS 2 (TAT 6-24 HRS): SARS Coronavirus 2: NEGATIVE

## 2020-02-11 NOTE — Progress Notes (Signed)
Physical Therapy Session Note  Patient Details  Name: Ruben Chavez MRN: [id number] Date of Birth: 17-Jun-1951  Today's Date: 02/11/2020 PT Individual Time: 1420-1535 PT Individual Time Calculation (min): 75 min   Short Term Goals: Week 2:  PT Short Term Goal 1 (Week 2): STG=LTG due to ELOS.  Skilled Therapeutic Interventions/Progress Updates:     Patient in w/c in room upon PT arrival. Patient alert and agreeable to PT session. Patient denied pain during session. Discussed d/c planning, family education scheduled for tomorrow, and patient's concerns about follow-up care. Encouraged him to continue further discussion about follow-up appointments with CSW and MD as appropriate. Also discussed use of e-stim unit and purpose of this intervention and promoted performing a functional task as it stimulated his wrist extensors to encourage neuroplastic changes.   Therapeutic Activity: Transfers: Patient performed sit to/from stand x8 with supervision-mod I. Provided verbal cues for setting up for standing proir to attempting to stand for safety to manage impulsivity with mobility. Five times Sit to Stand Test (FTSS) Method: Use a straight back chair with a solid seat that is 16-18" high. Ask participant to sit on the chair with arms folded across their chest.   Instructions: "Stand up and sit down as quickly as possible 5 times, keeping your arms folded across your chest."   Measurement: Stop timing when the participant stands the 5th time.  TIME: __13.8____ (in seconds)  Times > 13.6 seconds is associated with increased disability and morbidity (Guralnik, 2000) Times > 15 seconds is predictive of recurrent falls in healthy individuals aged 29 and older (Buatois, et al., 2008) Normal performance values in community dwelling individuals aged 18 and older (Bohannon, 2006): o 60-69 years: 11.4 seconds o 70-79 years: 12.6 seconds o 80-89 years: 14.8 seconds  MCID: ? 2.3 seconds for Vestibular  Disorders (Meretta, 2006)  Gait Training:  Patient ambulated in the room without an AD with distant supervision for safety throughout session. Patient carried a chair across the room and picked up objects from the floor during ambulation with supervision. Ambulated with decreased gait speed, decreased step length and height, mild forward trunk lean, downward head gaze, and decreased L arm swing. Provided verbal cues for erect posture, and increased L scapular activation for shoulder support.  Neuromuscular Re-ed: Patient performed the Berg Balance Test: Patient demonstrates increased fall risk as noted by score of  55/56 on Berg Balance Scale.  (<36= high risk for falls, close to 100%; 37-45 significant >80%; 46-51 moderate >50%; 52-55 lower >25%) He also performed quadruped on the bed with vertical and horizontal weight shifts and progressed to lifting L UE. Patient would grab onto the bed rail and shift his knees forward to compensate for L UE weakness. Provided cues for equal weight between legs and arms, L elbow extension, maintaining balance without R UE support, and provided approximation at the wrist for proprioceptive feedback and increased weight bearing. Patient denied wrist pain with this activity.  He performed B SLS x5 3-6 sec each trila and B tandem stance 2x30 sec without UE support while standing in a corner with a chair in front of him for safety. Educated on performing these exercises at home with this set up, patient stated understanding. Will provide HEP handout during next session.   Patient in w/c in room at end of session with breaks locked, seat belt alarm set, and all needs within reach. Discussed d/c planning and recommendations of 24/7 supervision for safety at home at end of session.  Patient stated understanding.   Therapy Documentation Precautions:  Precautions Precautions: Fall Precaution Comments: L hemi UE>LE Restrictions Weight Bearing Restrictions:  No  Balance: Standardized Balance Assessment Standardized Balance Assessment: Berg Balance Test Berg Balance Test Sit to Stand: Able to stand without using hands and stabilize independently Standing Unsupported: Able to stand safely 2 minutes Sitting with Back Unsupported but Feet Supported on Floor or Stool: Able to sit safely and securely 2 minutes Stand to Sit: Sits safely with minimal use of hands Transfers: Able to transfer safely, minor use of hands Standing Unsupported with Eyes Closed: Able to stand 10 seconds safely Standing Ubsupported with Feet Together: Able to place feet together independently and stand 1 minute safely From Standing, Reach Forward with Outstretched Arm: Can reach confidently >25 cm (10") From Standing Position, Pick up Object from Floor: Able to pick up shoe safely and easily From Standing Position, Turn to Look Behind Over each Shoulder: Looks behind from both sides and weight shifts well Turn 360 Degrees: Able to turn 360 degrees safely in 4 seconds or less Standing Unsupported, Alternately Place Feet on Step/Stool: Able to stand independently and safely and complete 8 steps in 20 seconds Standing Unsupported, One Foot in Front: Able to place foot tandem independently and hold 30 seconds Standing on One Leg: Able to lift leg independently and hold 5-10 seconds Total Score: 55    Therapy/Group: Individual Therapy  Meta Kroenke L Blanca Carreon PT, DPT  02/11/2020, 4:42 PM

## 2020-02-11 NOTE — Discharge Summary (Signed)
Physician Discharge Summary  Patient ID: Ruben Chavez MRN: [id number] DOB/AGE: Aug 05, 1951 69 y.o.  Admit date: 01/28/2020 Discharge date: 02/13/2020  Discharge Diagnoses:  Principal Problem:   Right middle cerebral artery stroke [facility]) Active Problems:   Cognitive and neurobehavioral dysfunction DVT prophylaxis Pain management Hypertension Hyperlipidemia Prostate cancer Remote tobacco abuse Constipation Covid exposure  Discharged Condition: Stable  Significant Diagnostic Studies: CT Code Stroke CTA Head W/WO contrast  Result Date: 01/24/2020 CLINICAL DATA:  Left-sided weakness.  Neglect. EXAM: CT ANGIOGRAPHY HEAD AND NECK TECHNIQUE: Multidetector CT imaging of the head and neck was performed using the standard protocol during bolus administration of intravenous contrast. Multiplanar CT image reconstructions and MIPs were obtained to evaluate the vascular anatomy. Carotid stenosis measurements (when applicable) are obtained utilizing NASCET criteria, using the distal internal carotid diameter as the denominator. CONTRAST:  70mL OMNIPAQUE IOHEXOL 350 MG/ML SOLN COMPARISON:  Head CT earlier same day FINDINGS: CTA NECK FINDINGS Aortic arch: Aortic atherosclerotic calcification. No aneurysm or dissection. Right carotid system: Right common carotid artery widely patent to the bifurcation. Calcified plaque at the carotid bifurcation and ICA bulb but no stenosis. Cervical ICA mildly irregular but patent to the skull base. Left carotid system: Common carotid artery widely patent to the bifurcation. Extensive calcified plaque at the carotid bifurcation and ICA bulb but no stenosis. Cervical ICA widely patent to the skull base. Vertebral arteries: Calcified plaque at the right vertebral artery origin with 30% stenosis. Left vertebral artery origin widely patent. Both vertebral arteries widely patent beyond that through the cervical region to the foramen magnum. Skeleton: Ordinary spondylosis. Other  neck: No mass or lymphadenopathy. Upper chest: Normal Review of the MIP images confirms the above findings CTA HEAD FINDINGS Anterior circulation: Both internal carotid arteries are patent through the skull base and siphon regions. There is atherosclerotic calcification in the carotid siphon regions but no stenosis greater than 30%. On the left, the anterior and middle cerebral vessels appear normal. On the right, there is embolic occlusion of the superior segment M2 branch. Posterior circulation: Both vertebral arteries are patent through the foramen magnum to the basilar. There is atherosclerotic disease in both V4 segments with stenoses estimated at 30-50%. No basilar stenosis. Posterior circulation branch vessels are patent. Venous sinuses: Patent and normal. Anatomic variants: None significant. Review of the MIP images confirms the above findings IMPRESSION: Embolic occlusion of the superior division M2 branch on the right. Atherosclerotic disease at both carotid bifurcation regions but no sign of flow limiting stenosis. On the initial CT, I probably underestimated the aspects score. I think there are some early changes higher than the temporal lobe, aspects probably 8 rather than 9. Case discussed with Dr. Cheral Marker during interpretation. Electronically Signed   By: Nelson Chimes M.D.   On: 01/24/2020 20:44   CT Code Stroke CTA Neck W/WO contrast  Result Date: 01/24/2020 CLINICAL DATA:  Left-sided weakness.  Neglect. EXAM: CT ANGIOGRAPHY HEAD AND NECK TECHNIQUE: Multidetector CT imaging of the head and neck was performed using the standard protocol during bolus administration of intravenous contrast. Multiplanar CT image reconstructions and MIPs were obtained to evaluate the vascular anatomy. Carotid stenosis measurements (when applicable) are obtained utilizing NASCET criteria, using the distal internal carotid diameter as the denominator. CONTRAST:  55mL OMNIPAQUE IOHEXOL 350 MG/ML SOLN COMPARISON:  Head CT  earlier same day FINDINGS: CTA NECK FINDINGS Aortic arch: Aortic atherosclerotic calcification. No aneurysm or dissection. Right carotid system: Right common carotid artery widely patent to the bifurcation. Calcified  plaque at the carotid bifurcation and ICA bulb but no stenosis. Cervical ICA mildly irregular but patent to the skull base. Left carotid system: Common carotid artery widely patent to the bifurcation. Extensive calcified plaque at the carotid bifurcation and ICA bulb but no stenosis. Cervical ICA widely patent to the skull base. Vertebral arteries: Calcified plaque at the right vertebral artery origin with 30% stenosis. Left vertebral artery origin widely patent. Both vertebral arteries widely patent beyond that through the cervical region to the foramen magnum. Skeleton: Ordinary spondylosis. Other neck: No mass or lymphadenopathy. Upper chest: Normal Review of the MIP images confirms the above findings CTA HEAD FINDINGS Anterior circulation: Both internal carotid arteries are patent through the skull base and siphon regions. There is atherosclerotic calcification in the carotid siphon regions but no stenosis greater than 30%. On the left, the anterior and middle cerebral vessels appear normal. On the right, there is embolic occlusion of the superior segment M2 branch. Posterior circulation: Both vertebral arteries are patent through the foramen magnum to the basilar. There is atherosclerotic disease in both V4 segments with stenoses estimated at 30-50%. No basilar stenosis. Posterior circulation branch vessels are patent. Venous sinuses: Patent and normal. Anatomic variants: None significant. Review of the MIP images confirms the above findings IMPRESSION: Embolic occlusion of the superior division M2 branch on the right. Atherosclerotic disease at both carotid bifurcation regions but no sign of flow limiting stenosis. On the initial CT, I probably underestimated the aspects score. I think there are  some early changes higher than the temporal lobe, aspects probably 8 rather than 9. Case discussed with Dr. Cheral Marker during interpretation. Electronically Signed   By: Nelson Chimes M.D.   On: 01/24/2020 20:44   MR BRAIN WO CONTRAST  Result Date: 01/25/2020 CLINICAL DATA:  69 year old male code stroke presentation yesterday, received IV tPA. Right MCA M2 occlusion treated with endovascular thrombectomy. EXAM: MRI HEAD WITHOUT CONTRAST TECHNIQUE: Multiplanar, multiecho pulse sequences of the brain and surrounding structures were obtained without intravenous contrast. COMPARISON:  CT head, CTA and CTP yesterday. FINDINGS: Brain: Patchy, confluent posterior right temporal lobe gray and white matter restricted diffusion corresponding to that evident by plain CT yesterday. Superimposed similar confluent infarct in the right operculum (series 7, image 52). Some involvement of the insula. Restricted diffusion here also tracks cephalad along the perirolandic cortex to the level of upper extremity representation. Additional smaller area of right superior frontal gyrus pre motor cortical restriction. Additional focus of right corona radiata involvement. T2 and FLAIR hyperintensity with petechial hemorrhage in the posterior right temporal lobe (Heidelberg Classification 1 A series 12, image 28). No other cerebral blood products identified. No significant mass effect. Small area of what appears to be chronic cortical encephalomalacia in the right parietal lobe on series 9, image 18. [place] Cables and white matter signal in the left hemisphere and posterior fossa is normal for age. No midline shift, ventriculomegaly, extra-axial collection. Cervicomedullary junction and pituitary are within normal limits. Vascular: Major intracranial vascular flow voids are preserved. Skull and upper cervical spine: Partially visible cervical spine degeneration at C3-C4. Visualized bone marrow signal is within normal limits. Sinuses/Orbits: Negative  orbits. Mucosal thickening and small fluid levels in the ethmoid and sphenoid sinuses. Other: Trace mastoid fluid. Visible internal auditory structures appear normal. Incidental benign appearing right occipital scalp lipoma (series 14, image 6). IMPRESSION: 1. Patchy and scattered acute right MCA territory infarct. Heidelberg Classification 1a petechial blood in the right temporal lobe. No significant mass effect. 2.  Suggestion of a small area of chronic right parietal lobe cortical encephalomalacia. Negative for age noncontrast MRI appearance of the brain elsewhere. Electronically Signed   By: Genevie Ann M.D.   On: 01/25/2020 21:26   IR CT Head Ltd  Result Date: 01/25/2020 INDICATION: New onset left-sided facial droop, left-sided weakness arm greater than leg with right gaze deviation.  EXAM: 1. EMERGENT LARGE VESSEL OCCLUSION THROMBOLYSIS (anterior CIRCULATION)  COMPARISON:  CT angiogram of the head and neck of January 24, 2020.  MEDICATIONS: Ancef 2 g IV antibiotic was administered within 1 hour of the procedure.  ANESTHESIA/SEDATION: General anesthesia.  CONTRAST:  Isovue 300 approximately 100 mL.  FLUOROSCOPY TIME:  Fluoroscopy Time: 67 minutes 32 seconds (2309 mGy).  COMPLICATIONS: None immediate.  TECHNIQUE: Following a full explanation of the procedure along with the potential associated complications, an informed witnessed consent was obtained. The risks of intracranial hemorrhage of 10%, worsening neurological deficit, ventilator dependency, death and inability to revascularize were all reviewed in detail with the patient's wife.  The patient was then put under general anesthesia by the Department of Anesthesiology at [facility].  The right groin was prepped and draped in the usual sterile fashion. Thereafter using modified Seldinger technique, transfemoral access into the right common femoral artery was obtained without difficulty. Over a 0.035 inch guidewire a 8 French Pinnacle  sheath was inserted. Through this, and also over a 0.035 inch guidewire a 5 [place] JB 1 catheter was advanced to the aortic arch region and selectively positioned in the innominate artery and the right common carotid artery.  FINDINGS: The innominate arteriogram demonstrates the origin of the right subclavian artery and the right common carotid artery to be widely patent.  The right vertebral artery origin is patent. The vessel is seen to opacify to the cranial skull base. Opacification is seen of the right vertebrobasilar junction and the right posterior-inferior cerebellar artery.  The right common carotid arteriogram demonstrates the right external carotid artery and its major branches to be widely patent.  The right internal carotid artery at the bulb to the cranial skull base demonstrates wide patency.  The petrous, cavernous and the supraclinoid segments are widely patent.  The right posterior communicating artery is seen transiently opacifying the right posterior cerebral artery distribution.  The right anterior cerebral artery opacifies into the capillary and venous phases. The right anterior cerebral artery M1 segment is widely patent.  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 The delayed arterial and capillary phase demonstrates a large area of hypoperfusion involving the posterior right middle cerebral artery distribution.  PROCEDURE: The diagnostic JB 1 catheter in right common carotid artery was exchanged over a 0.035 inch 300 cm Rosen exchange guidewire for an 087 balloon guide catheter which had been prepped with 50% contrast and 50% heparinized saline infusion and advanced to the mid cervical right ICA.  The guidewire was removed. Good aspiration obtained from the hub of the balloon guide catheter. A control arteriogram performed through this demonstrated no evidence of spasms, dissections or of  intraluminal filling defects.  Over a 0.014 inch standard Synchro micro guidewire, the combination of a Trevo ProVue microcatheter inside of a 6 French 132 cm Catalyst guide catheter was advanced without difficulty to the supraclinoid right ICA.  The micro guidewire was then gently manipulated and advanced through the occluded dominant inferior division into the M2 M3 region followed by the microcatheter. The guidewire  was removed. Good aspiration obtained from the hub of the microcatheter. Gentle control arteriogram performed through the microcatheter demonstrated safe position of the tip of the microcatheter.  A 4 mm x 40 mm Solitaire X retrieval device was advanced to the distal tip of the microcatheter. The O ring on the delivery microcatheter was then loosened. The retrieval device was then deployed in the usual manner.  The 6 [place] Catalyst guide catheter was advanced into the proximal right M1 segment initially.  With proximal flow arrest in the right internal carotid artery with the balloon guide catheter, and constant aspiration being applied at the hub of the 6 [place] Catalyst guide catheter which had now been advanced right at the origin of the occluded inferior division using a Penumbra aspiration device over 2 minutes, and with a 60 mL syringe at the hub of the balloon guide catheter, the combination was retrieved and removed. A control arteriogram performed following reversal of flow arrest in the right internal carotid artery demonstrated no significant alteration in the occluded right dominant inferior division.  A second pass was then made again using the above combination again after having verified safe position of tip of the microcatheter and inferior branch of the right middle cerebral artery M2 M3 region, the 4 mm x 40 mm Solitaire X retrieval device was again deployed as above. Again with proximal flow arrest in the right internal carotid artery, and with Penumbra aspiration through the  hub of the 6 [place] Catalyst guide catheter right now just inside the origin of the occluded branch over approximately 2 minutes, the combination was retrieved and removed as constant aspiration at the hub of the balloon guide catheter. Following reversal of flow arrest, a control arteriogram performed through the balloon guide catheter again demonstrated no significant alteration in the occluded inferior division occlusion.  A third pass was then made again using the Trevo ProVue microcatheter combination inside of a Catalyst guide catheter over a 0.014 inch standard Synchro micro guidewire. Again the microcatheter was advanced without difficulty into the M2 M3 segment of the inferior division over the micro guidewire. A safe position was then confirmed with aspiration and gentle contrast injection. At this time a 3 mm x 32 mm Trevo ProVue device was advanced to the distal end of the microcatheter. This was again deployed in the usual manner and left for approximately 3 minutes as constant aspiration was applied at the hub of the 6 [place] Catalyst guide catheter locked into position just inside the origin of the inferior division. With proximal flow arrest, and constant aspiration at the hub of the 6 [place] Catalyst guide catheter, and with a 60 mL syringe at the hub of the balloon guide catheter, the combination of the retrieval device, the microcatheter and the 6 [place] Catalyst guide catheter were retrieved and removed. Clot was seen entangled within the cells of the retrieval device.  Following reversal of flow arrest, a control arteriogram performed through the balloon guide catheter now demonstrated near complete revascularization of the inferior division. A TICI 2b revascularization was achieved. The delayed arterial phase continued to demonstrate retrograde opacification of the anterior peri-insular branches in the M3 M4 region. However, there continued to be a moderate sized area of hypoperfusion  subcortical region in the posterior operculum and the frontal parietal regions. This corresponds to the area of core abnormalities seen on CT perfusion maps earlier.  The right anterior cerebral artery and the right posterior communicating arteries remain patent.  Throughout the procedure,  the patient's blood pressure and neurological status remained stable.  No evidence of extravasation or mass-effect or midline shift was seen.  CT of the brain demonstrated contrast stain involving two posterior temporoparietal areas associated with a small focal area of subarachnoid leakage. However, no evidence of mass effect or midline shift was seen on the flat panel CT of the brain. An 8 [place] working sheath was left in situ and connected to continuous heparinized saline infusion.  The patient was left intubated for airway protection. He was then transferred to the neuro ICU for post thrombectomy management.  IMPRESSION: Status post revascularization of occluded dominant inferior division of the right middle cerebral artery with 2 passes with the Solitaire 4 mm x 40 mm X retrieval device, and 1 pass with the 3 mm x 32 mm Trevo ProVue retrieval device and Penumbra aspiration achieving a TICI 2b revascularization.  PLAN: Follow-up in the clinic 4 weeks post discharge.   Electronically Signed   By: [name] M.D.   On: 01/25/2020 10:29   CT CEREBRAL PERFUSION W CONTRAST  Result Date: 01/24/2020 CLINICAL DATA:  Left-sided weakness and neglect. EXAM: CT PERFUSION BRAIN TECHNIQUE: Multiphase CT imaging of the brain was performed following IV bolus contrast injection. Subsequent parametric perfusion maps were calculated using RAPID software. CONTRAST:  23mL OMNIPAQUE IOHEXOL 350 MG/ML SOLN COMPARISON:  CT head and CT angiography earlier same day. FINDINGS: CT Brain Perfusion Findings: CBF (<30%) Volume: 28mL Perfusion (Tmax>6.0s) volume: 188mL Mismatch Volume: 100mL ASPECTS on noncontrast CT Head: 8 at 2030  hours today. Infarct Core: 46 mL demonstrated in the frontoparietal brain. Completed infarction in the right temporal lobe not shown because of luxury perfusion. Infarction Location:Completed infarction in the right temporal lobe with luxury perfusion. New or completed infarction in the right frontoparietal brain. Additional 72 cc of at risk brain in the right MCA territory. IMPRESSION: Early subacute infarction in the right temporal lobe with pseudo normalization. Acute infarction at the right frontoparietal junction, 46 cc volume. Additional 72 cc of at risk brain in the right MCA territory. Results reported to Dr. Cheral Marker by text message at approximately 2055 hours. Electronically Signed   By: Nelson Chimes M.D.   On: 01/24/2020 21:03   ECHOCARDIOGRAM COMPLETE  Result Date: 01/25/2020    ECHOCARDIOGRAM REPORT   Patient Name:   Ruben Chavez Date of Exam: 01/25/2020 Medical Rec #:  [id number]         Height:       69.0 in Accession #:    [id number]        Weight:       180.0 lb Date of Birth:  [birth year]          BSA:          1.98 m Patient Age:    15 years          BP:           119/53 mmHg Patient Gender: M                 HR:           60 bpm. Exam Location:  Inpatient Procedure: 2D Echo Indications:    Stroke 434.91 / I163.9  History:        Patient has no prior history of Echocardiogram examinations.                 Signs/Symptoms:Hypotension; Risk Factors:Hypertension and  Dyslipidemia.  Sonographer:    Vikki Ports T[place]ine Referring Phys: [phone] ERIC LINDZEN  Sonographer Comments: Echo performed with patient supine and on artificial respirator. IMPRESSIONS  1. Left ventricular ejection fraction, by estimation, is 60 to 65%. The left ventricle has normal function. LV endocardial border not optimally defined to evaluate regional wall motion. Left ventricular diastolic parameters are consistent with Grade I diastolic dysfunction (impaired relaxation).  2. Right ventricular systolic function is  normal. The right ventricular size is normal. Tricuspid regurgitation signal is inadequate for assessing PA pressure.  3. The mitral valve is grossly normal. No evidence of mitral valve regurgitation. No evidence of mitral stenosis.  4. The aortic valve is tricuspid. Aortic valve regurgitation is not visualized. No aortic stenosis is present. Comparison(s): No prior Echocardiogram. FINDINGS  Left Ventricle: Left ventricular ejection fraction, by estimation, is 60 to 65%. The left ventricle has normal function. LV endocardial border not optimally defined to evaluate regional wall motion. The left ventricular internal cavity size was normal in size. There is no left ventricular hypertrophy. Left ventricular diastolic parameters are consistent with Grade I diastolic dysfunction (impaired relaxation). Right Ventricle: The right ventricular size is normal. No increase in right ventricular wall thickness. Right ventricular systolic function is normal. Tricuspid regurgitation signal is inadequate for assessing PA pressure. Left Atrium: Left atrial size was normal in size. Right Atrium: Right atrial size was normal in size. Pericardium: There is no evidence of pericardial effusion. Mitral Valve: The mitral valve is grossly normal. No evidence of mitral valve regurgitation. No evidence of mitral valve stenosis. Tricuspid Valve: The tricuspid valve is grossly normal. Tricuspid valve regurgitation is not demonstrated. Aortic Valve: The aortic valve is tricuspid. Aortic valve regurgitation is not visualized. No aortic stenosis is present. Pulmonic Valve: The pulmonic valve was grossly normal. Pulmonic valve regurgitation is not visualized. Aorta: The aortic root is normal in size and structure. Venous: The inferior vena cava was not well visualized. IAS/Shunts: No atrial level shunt detected by color flow Doppler.  LEFT VENTRICLE PLAX 2D LVIDd:         3.79 cm  Diastology LVIDs:         2.87 cm  LV e' lateral:   13.50 cm/s LV  PW:         0.96 cm  LV E/e' lateral: 5.1 LV IVS:        1.00 cm  LV e' medial:    9.90 cm/s LVOT diam:     2.10 cm  LV E/e' medial:  6.9 LV SV:         89.71 ml LV SV Index:   15.03 LVOT Area:     3.46 cm  RIGHT VENTRICLE RV S prime:     20.50 cm/s TAPSE (M-mode): 2.1 cm RIGHT ATRIUM           Index RA Area:     15.30 cm RA Volume:   35.00 ml  17.72 ml/m  AORTIC VALVE LVOT Vmax:   118.00 cm/s LVOT Vmean:  90.000 cm/s LVOT VTI:    0.259 m  AORTA Ao Root diam: 3.60 cm MITRAL VALVE MV Area (PHT): 2.76 cm    SHUNTS MV Decel Time: 275 msec    Systemic VTI:  0.26 m MV E velocity: 68.60 cm/s  Systemic Diam: 2.10 cm MV A velocity: 85.30 cm/s MV E/A ratio:  0.80 Eleonore Chiquito MD Electronically signed by Eleonore Chiquito MD Signature Date/Time: 01/25/2020/4:26:55 PM    Final    ECHO TEE  Result Date: 01/28/2020    TRANSESOPHOGEAL ECHO REPORT   Patient Name:   Ruben Chavez Date of Exam: 01/28/2020 Medical Rec #:  [id number]         Height:       69.0 in Accession #:    [id number]        Weight:       182.0 lb Date of Birth:  1951-10-27          BSA:          1.985 m Patient Age:    41 years          BP:           134/88 mmHg Patient Gender: M                 HR:           88 bpm. Exam Location:  Inpatient Procedure: Transesophageal Echo Indications:    CVA  History:        Patient has prior history of Echocardiogram examinations.                 Stroke.  Sonographer:    Mikki Santee RDCS (AE) Referring Phys: [id number] [place]: The transesophogeal probe was passed without difficulty through the esophogus of the patient. Sedation performed by different physician. The patient's vital signs; including heart rate, blood pressure, and oxygen saturation; remained stable throughout the procedure. The patient developed no complications during the procedure. IMPRESSIONS  1. No source of embolus Proceed with ILR.  2. Left ventricular ejection fraction, by estimation, is 60 to 65%. The left ventricle has  normal function. The left ventricle has no regional wall motion abnormalities. Left ventricular diastolic function could not be evaluated. Left ventricular diastolic function could not be evaluated.  3. Right ventricular systolic function is normal. The right ventricular size is normal.  4. No left atrial/left atrial appendage thrombus was detected.  5. The mitral valve is normal in structure and function. Trivial mitral valve regurgitation. No evidence of mitral stenosis.  6. The aortic valve is normal in structure and function. Aortic valve regurgitation is not visualized. No aortic stenosis is present.  7. The inferior vena cava is normal in size with greater than 50% respiratory variability, suggesting right atrial pressure of 3 mmHg.  8. Agitated saline contrast bubble study was negative, with no evidence of any interatrial shunt. Conclusion(s)/Recommendation(s): Normal biventricular function without evidence of hemodynamically significant valvular heart disease. FINDINGS  Left Ventricle: Left ventricular ejection fraction, by estimation, is 60 to 65%. The left ventricle has normal function. The left ventricle has no regional wall motion abnormalities. The left ventricular internal cavity size was normal in size. There is  no left ventricular hypertrophy. Left ventricular diastolic function could not be evaluated. Right Ventricle: The right ventricular size is normal. No increase in right ventricular wall thickness. Right ventricular systolic function is normal. Left Atrium: Left atrial size was normal in size. No left atrial/left atrial appendage thrombus was detected. Right Atrium: Right atrial size was normal in size. Pericardium: There is no evidence of pericardial effusion. Mitral Valve: The mitral valve is normal in structure and function. Normal mobility of the mitral valve leaflets. Trivial mitral valve regurgitation. No evidence of mitral valve stenosis. Tricuspid Valve: The tricuspid valve is normal  in structure. Tricuspid valve regurgitation is mild . No evidence of tricuspid stenosis. Aortic Valve: The aortic valve is normal in structure and function. Aortic valve regurgitation  is not visualized. No aortic stenosis is present. Pulmonic Valve: The pulmonic valve was normal in structure. Pulmonic valve regurgitation is mild. No evidence of pulmonic stenosis. Aorta: The aortic root is normal in size and structure. Venous: The inferior vena cava is normal in size with greater than 50% respiratory variability, suggesting right atrial pressure of 3 mmHg. IAS/Shunts: No atrial level shunt detected by color flow Doppler. Agitated saline contrast was given intravenously to evaluate for intracardiac shunting. Agitated saline contrast bubble study was negative, with no evidence of any interatrial shunt. Additional Comments: No source of embolus Proceed with ILR. Jenkins Rouge MD Electronically signed by Jenkins Rouge MD Signature Date/Time: 01/28/2020/2:09:41 PM    Final    IR PERCUTANEOUS ART THROMBECTOMY/INFUSION INTRACRANIAL INC DIAG ANGIO  Result Date: 01/28/2020 INDICATION: New onset left-sided facial droop, left-sided weakness arm greater than leg with right gaze deviation. EXAM: 1. EMERGENT LARGE VESSEL OCCLUSION THROMBOLYSIS (anterior CIRCULATION) COMPARISON:  CT angiogram of the head and neck of January 24, 2020. MEDICATIONS: Ancef 2 g IV antibiotic was administered within 1 hour of the procedure. ANESTHESIA/SEDATION: General anesthesia. CONTRAST:  Isovue 300 approximately 100 mL. FLUOROSCOPY TIME:  Fluoroscopy Time: 67 minutes 32 seconds (2309 mGy). COMPLICATIONS: None immediate. TECHNIQUE: Following a full explanation of the procedure along with the potential associated complications, an informed witnessed consent was obtained. The risks of intracranial hemorrhage of 10%, worsening neurological deficit, ventilator dependency, death and inability to revascularize were all reviewed in detail with the  patient's wife. The patient was then put under general anesthesia by the Department of Anesthesiology at [facility]. The right groin was prepped and draped in the usual sterile fashion. Thereafter using modified Seldinger technique, transfemoral access into the right common femoral artery was obtained without difficulty. Over a 0.035 inch guidewire a 8 French Pinnacle sheath was inserted. Through this, and also over a 0.035 inch guidewire a 5 [place] JB 1 catheter was advanced to the aortic arch region and selectively positioned in the innominate artery and the right common carotid artery. FINDINGS: The innominate arteriogram demonstrates the origin of the right subclavian artery and the right common carotid artery to be widely patent. The right vertebral artery origin is patent. The vessel is seen to opacify to the cranial skull base. Opacification is seen of the right vertebrobasilar junction and the right posterior-inferior cerebellar artery. The right common carotid arteriogram demonstrates the right external carotid artery and its major branches to be widely patent. The right internal carotid artery at the bulb to the cranial skull base demonstrates wide patency. The petrous, cavernous and the supraclinoid segments are widely patent. The right posterior communicating artery is seen transiently opacifying the right posterior cerebral artery distribution. The right anterior cerebral artery opacifies into the capillary and venous phases. The right anterior cerebral artery M1 segment is widely patent. 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 The delayed arterial and capillary phase demonstrates a large area of hypoperfusion involving the posterior right middle cerebral artery distribution. PROCEDURE: The diagnostic JB 1 catheter in right common carotid artery was exchanged over a 0.035 inch 300 cm Rosen exchange guidewire  for an 087 balloon guide catheter which had been prepped with 50% contrast and 50% heparinized saline infusion and advanced to the mid cervical right ICA. The guidewire was removed. Good aspiration obtained from the hub of the balloon guide catheter. A control arteriogram performed through this demonstrated no evidence of  spasms, dissections or of intraluminal filling defects. Over a 0.014 inch standard Synchro micro guidewire, the combination of a Trevo ProVue microcatheter inside of a 6 French 132 cm Catalyst guide catheter was advanced without difficulty to the supraclinoid right ICA. The micro guidewire was then gently manipulated and advanced through the occluded dominant inferior division into the M2 M3 region followed by the microcatheter. The guidewire was removed. Good aspiration obtained from the hub of the microcatheter. Gentle control arteriogram performed through the microcatheter demonstrated safe position of the tip of the microcatheter. A 4 mm x 40 mm Solitaire X retrieval device was advanced to the distal tip of the microcatheter. The O ring on the delivery microcatheter was then loosened. The retrieval device was then deployed in the usual manner. The 6 [place] Catalyst guide catheter was advanced into the proximal right M1 segment initially. With proximal flow arrest in the right internal carotid artery with the balloon guide catheter, and constant aspiration being applied at the hub of the 6 [place] Catalyst guide catheter which had now been advanced right at the origin of the occluded inferior division using a Penumbra aspiration device over 2 minutes, and with a 60 mL syringe at the hub of the balloon guide catheter, the combination was retrieved and removed. A control arteriogram performed following reversal of flow arrest in the right internal carotid artery demonstrated no significant alteration in the occluded right dominant inferior division. A second pass was then made again using the above  combination again after having verified safe position of tip of the microcatheter and inferior branch of the right middle cerebral artery M2 M3 region, the 4 mm x 40 mm Solitaire X retrieval device was again deployed as above. Again with proximal flow arrest in the right internal carotid artery, and with Penumbra aspiration through the hub of the 6 [place] Catalyst guide catheter right now just inside the origin of the occluded branch over approximately 2 minutes, the combination was retrieved and removed as constant aspiration at the hub of the balloon guide catheter. Following reversal of flow arrest, a control arteriogram performed through the balloon guide catheter again demonstrated no significant alteration in the occluded inferior division occlusion. A third pass was then made again using the Trevo ProVue microcatheter combination inside of a Catalyst guide catheter over a 0.014 inch standard Synchro micro guidewire. Again the microcatheter was advanced without difficulty into the M2 M3 segment of the inferior division over the micro guidewire. A safe position was then confirmed with aspiration and gentle contrast injection. At this time a 3 mm x 32 mm Trevo ProVue device was advanced to the distal end of the microcatheter. This was again deployed in the usual manner and left for approximately 3 minutes as constant aspiration was applied at the hub of the 6 [place] Catalyst guide catheter locked into position just inside the origin of the inferior division. With proximal flow arrest, and constant aspiration at the hub of the 6 [place] Catalyst guide catheter, and with a 60 mL syringe at the hub of the balloon guide catheter, the combination of the retrieval device, the microcatheter and the 6 [place] Catalyst guide catheter were retrieved and removed. Clot was seen entangled within the cells of the retrieval device. Following reversal of flow arrest, a control arteriogram performed through the balloon guide  catheter now demonstrated near complete revascularization of the inferior division. A TICI 2b revascularization was achieved. The delayed arterial phase continued to demonstrate retrograde opacification of the anterior  peri-insular branches in the M3 M4 region. However, there continued to be a moderate sized area of hypoperfusion subcortical region in the posterior operculum and the frontal parietal regions. This corresponds to the area of core abnormalities seen on CT perfusion maps earlier. The right anterior cerebral artery and the right posterior communicating arteries remain patent. Throughout the procedure, the patient's blood pressure and neurological status remained stable. No evidence of extravasation or mass-effect or midline shift was seen. CT of the brain demonstrated contrast stain involving two posterior temporoparietal areas associated with a small focal area of subarachnoid leakage. However, no evidence of mass effect or midline shift was seen on the flat panel CT of the brain. An 8 [place] working sheath was left in situ and connected to continuous heparinized saline infusion. The patient was left intubated for airway protection. He was then transferred to the neuro ICU for post thrombectomy management. IMPRESSION: Status post revascularization of occluded dominant inferior division of the right middle cerebral artery with 2 passes with the Solitaire 4 mm x 40 mm X retrieval device, and 1 pass with the 3 mm x 32 mm Trevo ProVue retrieval device and Penumbra aspiration achieving a TICI 2b revascularization. PLAN: Follow-up in the clinic 4 weeks post discharge. Electronically Signed   By: [name] M.D.   On: 01/25/2020 10:29   CT HEAD CODE STROKE WO CONTRAST  Addendum Date: 01/24/2020   ADDENDUM REPORT: 01/24/2020 20:49 ADDENDUM: I think that the initial interpretation underestimated the disease. Whereas there is a subacute infarction in the temporal lobe, there are probably more acute  changes with loss of gray-white differentiation above that in the frontoparietal region, making the aspects 8 rather than 9. Electronically Signed   By: Nelson Chimes M.D.   On: 01/24/2020 20:49   Result Date: 01/24/2020 CLINICAL DATA:  Code stroke.  Left-sided weakness. EXAM: CT HEAD WITHOUT CONTRAST TECHNIQUE: Contiguous axial images were obtained from the base of the skull through the vertex without intravenous contrast. COMPARISON:  None. FINDINGS: Brain: Subacute infarction in the right lateral temporal lobe. Mild swelling but no hemorrhage. No other acute finding. Elsewhere, there are mild chronic small-vessel changes of the white matter. No mass, hydrocephalus or extra-axial collection. Vascular: There is atherosclerotic calcification of the major vessels at the base of the brain. Skull: Negative Sinuses/Orbits: Clear/normal Other: None ASPECTS ([place] Stroke Program Early CT Score) - Ganglionic level infarction (caudate, lentiform nuclei, internal capsule, insula, M1-M3 cortex): 6 - Supraganglionic infarction (M4-M6 cortex): 3 Total score (0-10 with 10 being normal): 9 IMPRESSION: 1. Subacute infarction in the right lateral temporal lobe. No hemorrhage or mass effect. 2. ASPECTS is 9 3. These results were communicated to Dr. Cheral Marker at [place] 2/18/2021by text page via the [employer] messaging system. Electronically Signed: By: Nelson Chimes M.D. On: 01/24/2020 20:22   VAS [place] LOWER EXTREMITY VENOUS (DVT)  Result Date: 01/26/2020  Lower Venous DVTStudy Indications: Stroke.  Comparison Study: No prior study on file for comparison Performing Technologist: Sharion Dove RVS  Examination Guidelines: A complete evaluation includes B-mode imaging, spectral Doppler, color Doppler, and power Doppler as needed of all accessible portions of each vessel. Bilateral testing is considered an integral part of a complete examination. Limited examinations for reoccurring indications may be performed as noted. The reflux  portion of the exam is performed with the patient in reverse Trendelenburg.  +---------+---------------+---------+-----------+----------+--------------+ RIGHT    CompressibilityPhasicitySpontaneityPropertiesThrombus Aging +---------+---------------+---------+-----------+----------+--------------+ CFV      Full  Yes      Yes                                 +---------+---------------+---------+-----------+----------+--------------+ SFJ      Full                                                        +---------+---------------+---------+-----------+----------+--------------+ FV Prox  Full                                                        +---------+---------------+---------+-----------+----------+--------------+ FV Mid   Full                                                        +---------+---------------+---------+-----------+----------+--------------+ FV DistalFull                                                        +---------+---------------+---------+-----------+----------+--------------+ PFV      Full                                                        +---------+---------------+---------+-----------+----------+--------------+ POP      Full           Yes      Yes                                 +---------+---------------+---------+-----------+----------+--------------+ PTV      Full                                                        +---------+---------------+---------+-----------+----------+--------------+ PERO     Full                                                        +---------+---------------+---------+-----------+----------+--------------+   +---------+---------------+---------+-----------+----------+--------------+ LEFT     CompressibilityPhasicitySpontaneityPropertiesThrombus Aging +---------+---------------+---------+-----------+----------+--------------+ CFV      Full           Yes      Yes                                  +---------+---------------+---------+-----------+----------+--------------+ SFJ  Full                                                        +---------+---------------+---------+-----------+----------+--------------+ FV Prox  Full                                                        +---------+---------------+---------+-----------+----------+--------------+ FV Mid   Full                                                        +---------+---------------+---------+-----------+----------+--------------+ FV DistalFull                                                        +---------+---------------+---------+-----------+----------+--------------+ PFV      Full                                                        +---------+---------------+---------+-----------+----------+--------------+ POP      Full           Yes      Yes                                 +---------+---------------+---------+-----------+----------+--------------+ PTV      Full                                                        +---------+---------------+---------+-----------+----------+--------------+ PERO     Full                                                        +---------+---------------+---------+-----------+----------+--------------+     Summary: BILATERAL: - No evidence of deep vein thrombosis seen in the lower extremities, bilaterally.   *See table(s) above for measurements and observations. Electronically signed by Deitra Mayo MD on 01/26/2020 at 4:57:01 PM.    Final    VAS [place] UPPER EXTREMITY VENOUS DUPLEX  Result Date: 01/31/2020 UPPER VENOUS STUDY  Indications: Swelling Comparison Study: no prior Performing Technologist: Abram Sander RVS  Examination Guidelines: A complete evaluation includes B-mode imaging, spectral Doppler, color Doppler, and power Doppler as needed of all accessible portions of each vessel. Bilateral testing is  considered an integral part of a complete examination. Limited examinations for reoccurring indications may be performed as noted.  Left Findings: +----------+------------+---------+-----------+----------+-------+ LEFT      CompressiblePhasicitySpontaneousPropertiesSummary +----------+------------+---------+-----------+----------+-------+ IJV           Full       Yes       Yes                      +----------+------------+---------+-----------+----------+-------+ Subclavian    Full       Yes       Yes                      +----------+------------+---------+-----------+----------+-------+ Axillary      Full       Yes       Yes                      +----------+------------+---------+-----------+----------+-------+ Brachial      Full       Yes       Yes                      +----------+------------+---------+-----------+----------+-------+ Radial        Full                                          +----------+------------+---------+-----------+----------+-------+ Ulnar         Full                                          +----------+------------+---------+-----------+----------+-------+ Cephalic      Full                                          +----------+------------+---------+-----------+----------+-------+ Basilic       Full                                          +----------+------------+---------+-----------+----------+-------+  Summary:  Left: No evidence of deep vein thrombosis in the upper extremity. No evidence of superficial vein thrombosis in the upper extremity.  *See table(s) above for measurements and observations.  Diagnosing physician: Monica Martinez MD Electronically signed by Monica Martinez MD on 01/31/2020 at 7:04:07 PM.    Final    IR ANGIO VERTEBRAL SEL SUBCLAVIAN INNOMINATE UNI R MOD SED  Result Date: 01/25/2020 INDICATION: New onset left-sided facial droop, left-sided weakness arm greater than leg with right gaze  deviation.  EXAM: 1. EMERGENT LARGE VESSEL OCCLUSION THROMBOLYSIS (anterior CIRCULATION)  COMPARISON:  CT angiogram of the head and neck of January 24, 2020.  MEDICATIONS: Ancef 2 g IV antibiotic was administered within 1 hour of the procedure.  ANESTHESIA/SEDATION: General anesthesia.  CONTRAST:  Isovue 300 approximately 100 mL.  FLUOROSCOPY TIME:  Fluoroscopy Time: 67 minutes 32 seconds (2309 mGy).  COMPLICATIONS: None immediate.  TECHNIQUE: Following a full explanation of the procedure along with the potential associated complications, an informed witnessed consent was obtained. The risks of intracranial hemorrhage of 10%, worsening neurological deficit, ventilator dependency, death and inability to revascularize were all reviewed in detail with the patient's wife.  The patient was then put under general anesthesia by the  Department of Anesthesiology at [facility].  The right groin was prepped and draped in the usual sterile fashion. Thereafter using modified Seldinger technique, transfemoral access into the right common femoral artery was obtained without difficulty. Over a 0.035 inch guidewire a 8 French Pinnacle sheath was inserted. Through this, and also over a 0.035 inch guidewire a 5 [place] JB 1 catheter was advanced to the aortic arch region and selectively positioned in the innominate artery and the right common carotid artery.  FINDINGS: The innominate arteriogram demonstrates the origin of the right subclavian artery and the right common carotid artery to be widely patent.  The right vertebral artery origin is patent. The vessel is seen to opacify to the cranial skull base. Opacification is seen of the right vertebrobasilar junction and the right posterior-inferior cerebellar artery.  The right common carotid arteriogram demonstrates the right external carotid artery and its major branches to be widely patent.  The right internal carotid artery at the bulb to the cranial skull  base demonstrates wide patency.  The petrous, cavernous and the supraclinoid segments are widely patent.  The right posterior communicating artery is seen transiently opacifying the right posterior cerebral artery distribution.  The right anterior cerebral artery opacifies into the capillary and venous phases. The right anterior cerebral artery M1 segment is widely patent.  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 The delayed arterial and capillary phase demonstrates a large area of hypoperfusion involving the posterior right middle cerebral artery distribution.  PROCEDURE: The diagnostic JB 1 catheter in right common carotid artery was exchanged over a 0.035 inch 300 cm Rosen exchange guidewire for an 087 balloon guide catheter which had been prepped with 50% contrast and 50% heparinized saline infusion and advanced to the mid cervical right ICA.  The guidewire was removed. Good aspiration obtained from the hub of the balloon guide catheter. A control arteriogram performed through this demonstrated no evidence of spasms, dissections or of intraluminal filling defects.  Over a 0.014 inch standard Synchro micro guidewire, the combination of a Trevo ProVue microcatheter inside of a 6 French 132 cm Catalyst guide catheter was advanced without difficulty to the supraclinoid right ICA.  The micro guidewire was then gently manipulated and advanced through the occluded dominant inferior division into the M2 M3 region followed by the microcatheter. The guidewire was removed. Good aspiration obtained from the hub of the microcatheter. Gentle control arteriogram performed through the microcatheter demonstrated safe position of the tip of the microcatheter.  A 4 mm x 40 mm Solitaire X retrieval device was advanced to the distal tip of the microcatheter. The O ring on the delivery microcatheter was then loosened. The retrieval device  was then deployed in the usual manner.  The 6 [place] Catalyst guide catheter was advanced into the proximal right M1 segment initially.  With proximal flow arrest in the right internal carotid artery with the balloon guide catheter, and constant aspiration being applied at the hub of the 6 [place] Catalyst guide catheter which had now been advanced right at the origin of the occluded inferior division using a Penumbra aspiration device over 2 minutes, and with a 60 mL syringe at the hub of the balloon guide catheter, the combination was retrieved and removed. A control arteriogram performed following reversal of flow arrest in the right internal carotid artery demonstrated no significant alteration in the occluded right dominant inferior division.  A second pass was  then made again using the above combination again after having verified safe position of tip of the microcatheter and inferior branch of the right middle cerebral artery M2 M3 region, the 4 mm x 40 mm Solitaire X retrieval device was again deployed as above. Again with proximal flow arrest in the right internal carotid artery, and with Penumbra aspiration through the hub of the 6 [place] Catalyst guide catheter right now just inside the origin of the occluded branch over approximately 2 minutes, the combination was retrieved and removed as constant aspiration at the hub of the balloon guide catheter. Following reversal of flow arrest, a control arteriogram performed through the balloon guide catheter again demonstrated no significant alteration in the occluded inferior division occlusion.  A third pass was then made again using the Trevo ProVue microcatheter combination inside of a Catalyst guide catheter over a 0.014 inch standard Synchro micro guidewire. Again the microcatheter was advanced without difficulty into the M2 M3 segment of the inferior division over the micro guidewire. A safe position was then confirmed with aspiration and gentle  contrast injection. At this time a 3 mm x 32 mm Trevo ProVue device was advanced to the distal end of the microcatheter. This was again deployed in the usual manner and left for approximately 3 minutes as constant aspiration was applied at the hub of the 6 [place] Catalyst guide catheter locked into position just inside the origin of the inferior division. With proximal flow arrest, and constant aspiration at the hub of the 6 [place] Catalyst guide catheter, and with a 60 mL syringe at the hub of the balloon guide catheter, the combination of the retrieval device, the microcatheter and the 6 [place] Catalyst guide catheter were retrieved and removed. Clot was seen entangled within the cells of the retrieval device.  Following reversal of flow arrest, a control arteriogram performed through the balloon guide catheter now demonstrated near complete revascularization of the inferior division. A TICI 2b revascularization was achieved. The delayed arterial phase continued to demonstrate retrograde opacification of the anterior peri-insular branches in the M3 M4 region. However, there continued to be a moderate sized area of hypoperfusion subcortical region in the posterior operculum and the frontal parietal regions. This corresponds to the area of core abnormalities seen on CT perfusion maps earlier.  The right anterior cerebral artery and the right posterior communicating arteries remain patent.  Throughout the procedure, the patient's blood pressure and neurological status remained stable.  No evidence of extravasation or mass-effect or midline shift was seen.  CT of the brain demonstrated contrast stain involving two posterior temporoparietal areas associated with a small focal area of subarachnoid leakage. However, no evidence of mass effect or midline shift was seen on the flat panel CT of the brain. An 8 [place] working sheath was left in situ and connected to continuous heparinized saline infusion.  The patient  was left intubated for airway protection. He was then transferred to the neuro ICU for post thrombectomy management.  IMPRESSION: Status post revascularization of occluded dominant inferior division of the right middle cerebral artery with 2 passes with the Solitaire 4 mm x 40 mm X retrieval device, and 1 pass with the 3 mm x 32 mm Trevo ProVue retrieval device and Penumbra aspiration achieving a TICI 2b revascularization.  PLAN: Follow-up in the clinic 4 weeks post discharge.   Electronically Signed   By: [name] M.D.   On: 01/25/2020 10:29    Labs:  Basic Metabolic Panel: Recent Labs  Lab 02/11/20 0520  NA 140  K 3.8  CL 108  CO2 24  GLUCOSE 96  BUN 16  CREATININE 0.89  CALCIUM 8.9    CBC: Recent Labs  Lab 02/11/20 0520  WBC 6.0  NEUTROABS 3.9  HGB 10.7*  HCT 32.3*  MCV 93.6  PLT 471*    CBG: No results for input(s): GLUCAP in the last 168 hours.  Family history.  Mother with pancreatic cancer.  Father with CAD.  Negative colon cancer diabetes mellitus or hypertension  Brief HPI:   Ruben Chavez is a 69 y.o. right-handed male with history of hyperlipidemia, hypertension, prostate cancer with radioactive seed implant, remote tobacco abuse.  Per chart review lives with spouse independent prior to admission.  Two-level home half bath on main level and one-step to entry.  Presented 01/24/2020 with acute onset of left-sided weakness facial droop and dysarthria.  Cranial CT scan showed subacute infarction right lateral temporal lobe.  No hemorrhage or mass-effect.  CT angiogram of head and neck embolic occlusion of the superior division M2 branch of the right.  Patient did receive TPA.  Underwent revascularization procedure per interventional radiology.  Echocardiogram with ejection fraction of 65% without emboli.  Venous Doppler studies negative.  Neurology consulted maintain on aspirin and Plavix for CVA prophylaxis.  Subcutaneous Lovenox for DVT prophylaxis.  TEE  showed ejection fraction 65% negative bubble study without thrombus.  Loop recorder placed 01/28/2020.  Patient did have occasional bouts of orthostatic hypotension blood pressure in the 60s on 01/26/2020 conservative care monitor out of bed.  Patient was admitted for a comprehensive rehab program   Hospital Course: Ruben Chavez was admitted to rehab 01/28/2020 for inpatient therapies to consist of PT, ST and OT at least three hours five days a week. Past admission physiatrist, therapy team and rehab RN have worked together to provide customized collaborative inpatient rehab.  Pertaining to patient's right MCA infarction status post loop recorder.  Remained on aspirin and Plavix x3 months and aspirin alone.  Subcutaneous Lovenox for DVT prophylaxis.  Patient follow-up neurology services.  Pain managed with use of tramadol.  Blood pressure control lisinopril and would follow-up primary MD.  Wellbutrin for mood stabilization attending full therapies as well as follow-up per neuropsychology.  Lipitor for hyperlipidemia.  Patient was exposed to Covid through family being positive patient placed on precautions latest Covid testing negative.   Blood pressures were monitored on TID basis and controlled  He/ is continent of bowel and bladder.  He/ has made gains during rehab stay and is attending therapies  He/ will continue to receive follow up therapies   after discharge  Rehab course: During patient's stay in rehab weekly team conferences were held to monitor patient's progress, set goals and discuss barriers to discharge. At admission, patient required minimal assist ambulate 20 feet rolling walker, minimal assist sit to stand.  Moderate assist upper body bathing mod assist lower body bathing moderate assist upper body dressing max assist lower body dressing  Physical exam.  Blood pressure 128/73 pulse 59 temperature 92 respirations 18 oxygen saturation 99% room air Constitutional.  Alert and well  developed HEENT Head.  Normocephalic and atraumatic Eyes.  Pupils round and reactive to light without discharge no nystagmus Neck.  Supple nontender no JVD without thyromegaly Cardiac regular rate rhythm without any extra sounds or murmur heard Respiratory.  Effort normal no respiratory distress without wheeze GI.  Soft nontender positive bowel sounds without rebound Musculoskeletal General.  Normal  range of motion Cervical back.  Normal range of motion Neurological.  Alert and oriented fair insight and awareness speech was mildly dysarthric.  Right upper extremity 5 out of 5 left upper extremity 2 - 3 out of 5 proximal with trace distally bilateral lower extremities 4+ to 5.  No focal sensory findings  He/  has had improvement in activity tolerance, balance, postural control as well as ability to compensate for deficits. He/ has had improvement in functional use RUE/LUE  and RLE/LLE as well as improvement in awareness.  Patient ambulates in the room without assistive device with distant supervision for safety throughout sessions.  Patient carried a chair across the room and picked up object from the floor during ambulation with supervision.  Ambulated with decreased gait speed decreased step length with a high mild forward trunk lean.  He can gather belongings for activities day living and homemaking.  It was discussed with family for supervision for his safety.  Speech therapy follow-up sessions focused on cognitive goals needed some extra time to complete some problem solving.  Patient independently could call his wife on the phone.  Patient scored within functional limits on executive functioning visual-spatial subtest.       Disposition: Discharge to home    Diet: Regular  Special Instructions: No driving smoking or alcohol  Continue aspirin 305 mg daily and Plavix 75 mg daily x3 months and aspirin alone  Medications at discharge 1.  Tylenol as needed 2.  Aspirin 325 mg p.o.  daily 3.  Lipitor 20 mg p.o. daily 4.  Wellbutrin 75 mg p.o. twice daily 5.  Plavix 75 mg p.o. daily 6.  Folic acid 1 mg p.o. daily 7.  Lisinopril 20 mg p.o. daily 8.  Multivitamin daily 9.  Protonix 40 mg p.o. daily 10.  Ultram 50 mg every 6 hours as needed pain 11.  Trazodone 100 mg p.o. nightly  Discharge Instructions    Ambulatory referral to Neurology   Complete by: As directed    An appointment is requested in approximately 4 weeks right MCA infarction   Ambulatory referral to Physical Medicine Rehab   Complete by: As directed    Moderate complexity follow-up 1 to 2 weeks right MCA infarction     Allergies as of 02/13/2020      Reactions   Sulfa Antibiotics Swelling, Other (See Comments)   Swelling in ankles   Advil [ibuprofen] Other (See Comments)   "Dots on chest" (Petechiae)   Aleve [naproxen] Other (See Comments)   "Dots on chest" (Petechiae)   Betadine [povidone Iodine] Itching, Rash   Chloroxylenol (antiseptic) Itching, Rash, Other (See Comments)   PCMX surgical sterilizing scrub   Poison Ivy Extract Rash   Povidone-iodine Itching, Rash   BETADINE      Medication List    STOP taking these medications   cephALEXin 500 MG capsule Commonly known as: KEFLEX   enoxaparin 40 MG/0.4ML injection Commonly known as: LOVENOX   HYDROcodone-acetaminophen 5-325 MG tablet Commonly known as: NORCO/VICODIN   lisinopril 20 MG tablet Commonly known as: ZESTRIL   methocarbamol 500 MG tablet Commonly known as: Robaxin   thiamine 100 MG tablet     TAKE these medications   acetaminophen 325 MG tablet Commonly known as: TYLENOL Take 2 tablets (650 mg total) by mouth every 4 (four) hours as needed for mild pain (or temp > 37.5 C (99.5 F)).   aspirin 325 MG EC tablet Take 1 tablet (325 mg total) by mouth daily.  atorvastatin 20 MG tablet Commonly known as: LIPITOR Take 1 tablet (20 mg total) by mouth daily at 6 PM.   buPROPion 75 MG tablet Commonly known as:  WELLBUTRIN Take 1 tablet (75 mg total) by mouth 2 (two) times daily at 10 am and 4 pm.   clopidogrel 75 MG tablet Commonly known as: PLAVIX Take 1 tablet (75 mg total) by mouth daily.   folic acid 1 MG tablet Commonly known as: FOLVITE Take 1 tablet (1 mg total) by mouth daily.   multivitamin with minerals Tabs tablet Take 1 tablet by mouth daily.   pantoprazole 40 MG tablet Commonly known as: PROTONIX Take 1 tablet (40 mg total) by mouth daily.   senna-docusate 8.6-50 MG tablet Commonly known as: Senokot-S Take 2 tablets by mouth daily.   traMADol 50 MG tablet Commonly known as: ULTRAM Take 1 tablet (50 mg total) by mouth every 6 (six) hours as needed for moderate pain.   traZODone 100 MG tablet Commonly known as: DESYREL Take 1 tablet (100 mg total) by mouth at bedtime.      Follow-up Information    Meredith Staggers, MD Follow up.   Specialty: Physical Medicine and Rehabilitation Why: Office to call for appointment Contact information: [address] [place] [address] [phone]        [name], MD Follow up.   Specialty: Cardiology Why: Call for appointment Contact information: [id number] [place] [address] [phone]        [name], MD Follow up.   Specialties: Interventional Radiology, Radiology Why: Call for appointment Contact information: [place] [address] [phone]           Signed: Cathlyn Parsons 02/13/2020, 5:15 AM

## 2020-02-11 NOTE — Progress Notes (Signed)
Social Work Patient ID: Ruben P Hanzlik, male   DOB: 04/25/1951, 69 y.o.   MRN: [id number]   SW met with pt in room to discuss outpatient PT/OT/ST recs. Pt prefers [facility] (p:[phone]/f:[phone]). SW faxed referral to location.  *SW confirms referral was received. SW called pt wife Katherine ([phone]) to inform on above. She also reports that she received the link for zoom video call tomorrow,a dn her son is helping her increase her data usage so she can be present for the meeting tomorrow. SW encouraged follow-up if there are any issues. SW updated therapy team.    , MSW, LCSWA Office: [phone] Cell: [phone] Fax: [phone]  

## 2020-02-11 NOTE — Progress Notes (Signed)
Speech Language Pathology Daily Session Note  Patient Details  Name: MOX YERBY MRN: [id number] Date of Birth: 1951/09/23  Today's Date: 02/11/2020 SLP Individual Time: [id number] SLP Individual Time Calculation (min): 45 min  Short Term Goals: Week 2: SLP Short Term Goal 1 (Week 2): STGs=LTGs due to ELOS  Skilled Therapeutic Interventions: Skilled treatment session focused on cognitive goals. Patient missed initial 15 minutes of session due to eating breakfast. SLP facilitated session by providing extra time and Min A verbal cues for problem solving and error awareness while donning his shirt (patient unaware he did not thread his LUE). SLP also facilitated session with a functional conversation that focused on d/c planning. Patient independently called his wife in attempts to coordinate family education tomorrow via zoom. Patient also independently generated a list of questions for the physician and stored them on his phone to maximize recall for a later date. SLP also initiated a cognitive assessment. Patient scored WFL on the executive functioning/visual spatial subtests but all other subtests will be administered at the next session. Patient left upright in wheelchair with alarm on and all needs within reach. Continue with current plan of care.      Pain Pain Assessment Pain Scale: 0-10 Pain Score: 4  Pain Type: Acute pain Pain Location: Other (Comment) Pain Orientation: Left Pain Descriptors / Indicators: Aching;Dull Pain Frequency: Intermittent Pain Onset: On-going Patients Stated Pain Goal: 0 Pain Intervention(s): Medication (See eMAR)  Therapy/Group: Individual Therapy  Adanya Sosinski 02/11/2020, 12:41 PM

## 2020-02-11 NOTE — Progress Notes (Signed)
Occupational Therapy Session Note  Patient Details  Name: Ruben Chavez MRN: [id number] Date of Birth: [birth year]  Today's Date: 02/11/2020 OT Individual Time: 1000-1058 OT Individual Time Calculation (min): 58 min    Short Term Goals: Week 2:  OT Short Term Goal 1 (Week 2): STG = LTG, overall S level  Skilled Therapeutic Interventions/Progress Updates:    Patient seated in w/c , alert and ready for therapy session.  Able to recall conversation and plans from last week and earlier today.  Noted less edema in left hand today, increased volitional pron/sup.  Sit to stand and ambulation in room with CS.  Shower completed at distant supervision.  UB/LB dressing with CS, occ cues for orientation/position in space.  Grooming tasks completed in stance at sink with CS, occ cues for sequencing and increased time.   Positioned in w/c at close of session with saebo stim set, seat belt alarm and needs within reach.    Therapy Documentation Precautions:  Precautions Precautions: Fall Precaution Comments: L hemi UE>LE Restrictions Weight Bearing Restrictions: No Pain: Pain Assessment Pain Scale: 0-10 Pain Score: 0-No pain     Therapy/Group: Individual Therapy  Carlos Levering 02/11/2020, 10:17 AM

## 2020-02-11 NOTE — Progress Notes (Signed)
[facility] PHYSICAL MEDICINE & REHABILITATION PROGRESS NOTE   Subjective/Complaints:  Pt reports he has a lot of questions.  Asking about driving and when able to ; explained outpt OT and Neurology will determine when; can use hot tub for very short periods; asking about d/c plans- explained doing family ed today by Zoom and timing 10-12.  Pt also had a lot of questions about TV meds- Eliquis, lisinopril, etc     ROS: pt denies SOB, CP, abd pain, NV/C/D.   Objective:   No results found. Recent Labs    02/11/20 0520  WBC 6.0  HGB 10.7*  HCT 32.3*  PLT 471*   Recent Labs    02/11/20 0520  NA 140  K 3.8  CL 108  CO2 24  GLUCOSE 96  BUN 16  CREATININE 0.89  CALCIUM 8.9    Intake/Output Summary (Last 24 hours) at 02/11/2020 0949 Last data filed at 02/11/2020 0839 Gross per 24 hour  Intake 952 ml  Output --  Net 952 ml    Labs not done yet are active!   Physical Exam: Vital Signs Blood pressure 140/73, pulse 69, temperature 98 F (36.7 C), resp. rate 18, height 5\' 11"  (1.803 m), weight 82.3 kg, SpO2 98 %.  Physical Exam Constitutional: pt sitting up in manual w/c, wearing mask, SLP at bedside, NAD HEENT: conjugate gaze Cardiovascular: RRR Respiratory/Chest: CTA B/L GI/Abdomen: soft, NT, ND, (+)BS  Ext: no clubbing, cyanosis-  L hand stable- elevated; trace edema now;  Musculoskeletal:  Still LUE 3/5 in biceps, triceps, 2/5 WE and no hand/fingers Has grinding sound with constant movement of L wrist- sounds like arthritic changes General: Normal range of motion.  Cervical back:not as tight neck/shoulder muscles Skin: -rash on back looks bette-r less erythema/less red Rash on inner thighs- improved- almost gone  Psychiatric:very talaktive  Assessment/Plan: 1. Functional deficits secondary to R MCA stroke with L hemiparesis which require 3+ hours per day of interdisciplinary therapy in a comprehensive inpatient rehab setting.  Physiatrist is  providing close team supervision and 24 hour management of active medical problems listed below.  Physiatrist and rehab team continue to assess barriers to discharge/monitor patient progress toward functional and medical goals  Care Tool:  Bathing  Bathing activity did not occur: Refused Body parts bathed by patient: Chest, Left arm, Abdomen, Face, Front perineal area, Right upper leg, Left upper leg, Right lower leg, Left lower leg, Buttocks, Right arm   Body parts bathed by helper: Right arm     Bathing assist Assist Level: Minimal Assistance - Patient > 75%     Upper Body Dressing/Undressing Upper body dressing Upper body dressing/undressing activity did not occur (including orthotics): Refused What is the patient wearing?: Pull over shirt    Upper body assist Assist Level: Contact Guard/Touching assist    Lower Body Dressing/Undressing Lower body dressing    Lower body dressing activity did not occur: Refused What is the patient wearing?: Pants     Lower body assist Assist for lower body dressing: Minimal Assistance - Patient > 75%     Toileting Toileting Toileting Activity did not occur (Clothing management and hygiene only): N/A (no void or bm)  Toileting assist Assist for toileting: Supervision/Verbal cueing     Transfers Chair/bed transfer  Transfers assist     Chair/bed transfer assist level: Supervision/Verbal cueing     Locomotion Ambulation   Ambulation assist      Assist level: Supervision/Verbal cueing Assistive device: No Device Max distance:  300'   Walk 10 feet activity   Assist     Assist level: Supervision/Verbal cueing Assistive device: No Device   Walk 50 feet activity   Assist    Assist level: Supervision/Verbal cueing Assistive device: No Device    Walk 150 feet activity   Assist Walk 150 feet activity did not occur: Safety/medical concerns(distance not tolerated today)  Assist level: Supervision/Verbal  cueing Assistive device: No Device    Walk 10 feet on uneven surface  activity   Assist     Assist level: Contact Guard/Touching assist Assistive device: Other (comment)(no device)   Wheelchair     Assist Will patient use wheelchair at discharge?: No Type of Wheelchair: Manual Wheelchair activity did not occur: N/A         Wheelchair 50 feet with 2 turns activity    Assist            Wheelchair 150 feet activity     Assist          Blood pressure 140/73, pulse 69, temperature 98 F (36.7 C), resp. rate 18, height 5\' 11"  (1.803 m), weight 82.3 kg, SpO2 98 %.  Medical Problem List and Plan: 1.Left side weakness with dysarthria and facial droopsecondary to right MCA infarction.Status post loop recorder placement  3/1- pt has new L biceps and triceps movement- over weekend- will con't OT for UB strengthening.   3/2- put on droplet precautions for possible COVID- has been exposed  3/3- has new pronation and supination on LUE  3/5- can use Estim for hand/finger movement- still has difficulties -patient may shower -ELOS/Goals: 10-14 days, mod I  Continue CIR PT and OT 2. Antithrombotics: -DVT/anticoagulation:Lovenox -antiplatelet therapy: Aspirin 325 mg daily and Plavix 75 mg daily x3 months then aspirin alone 3. Pain Management:Tylenol as needed  2/23- pt took Norco at home- willing to try tramadol 50 mg q6 hours prn ordered- if not effective, will do Norco  2/25- less back pain  3/3- pain not an issue per pt.   3/6, 3/7: pain well controlled.  3/8- no complaints- will send home on Tramadol  4. Mood:Provide emotional support -antipsychotic agents: N/A 5. Neuropsych: This patientiscapable of making decisions on hisown behalf. 6. Skin/Wound Care:Routine skin checks 7. Fluids/Electrolytes/Nutrition:Routine in and outs with follow-up chemistries  2/23- kidney function great and K+ is 3.7-  con't to monitor  2/25- check tomorrow   3/1- labs in AM- Cr 0.95 today.   3/2- labs in process- pending- will address as comes back in.  3/5- labs for Monday ordered to f/u   3/8- Cr and lytes well controlled as above- con't to encourage fluids.  8. Hypertension. Lisinopril 20 mg daily. Monitor with increased mobility  2/25- BP running 0000000 systolic- con't regimen  [id number] bp controlled  3/1- BP running 120s/70s- well controlled- con't regimen  3/3- BP running 110s/70s- well controlled- con't regimen.   3/5- BP 130s/70s- con't regimen  3/6, 3/7: well controllled. Continue current regimen. 9. Hyperlipidemia. Lipitor 10.Prostate cancerwith history of radioactive seed implant. Follow-up outpatient  3/4- can use estim since been 3+ years since treatment 11. Remote tobacco abuse. Continue to provide counseling 12. Constipation- LBM 5 days ago  2/23- will order Senokot-S 2 tabs qAM, and sorbitol and dulcolax suppository prn  2/24- had 2 BMs- feeling better  3/6: having regular BM 13. Neck tightness/myofascial pain  2/23- might benefit from trigger point injections if tramadol not effective  2/24- slept better with his tempurpedic pillow- will con't to  monitor  2/26- notes neck pain better- con't to monitor- can still do injections if needed  3/3- reports doesn't need injections 14/ Rashes- will try cortisone lotion for both and see how it helps BID;  2/26- helping both rashes- con't regime  3/2- Inner thigh rash gone- con't meds for back rash 15. LUE edema  2/25- concerned about DVT_ get Dopplers of LUE today and monitor Sx's.   2/26- Dopplers (-)- will get Koban to wrap LUE- by OT or PT- esp hand/fingers and encouraged pt to not let arm dangle.  2/28 reviewed massage and edema mgt techniques   3/1- pt's getting wrapped with kineseotape today to help swelling/edema.   3/2- hand looks 30% better today- con't Ktape  3/4- con't Ktape for swelling-   3/5- almost gone- con't Ktape  as needed 16. Depression  2/26- pt wants ot restart Wellbutrin- will start 75 mg BID- low dose, esp because (although he knows it's worked) can lower seizure threshold slightly- will monitor for seizures.   2/27- happy to be on wellbutrin. Discussed the fact that his right cortical infarct may lead to emotional lability/PBA. Monitor for now. Might be a candidate for nuedexta if symptoms are ongoing or progressive.   3/8- depression better per pt. Brighter- con't meds 17. COVID exposure-  3/1- will get COIVD test done asap- will wait for additional therapies today until we have COIVD test results.   3/2- wife, who came to visit pt, is (+) in spite of COVID vaccine (2nd one 2 days prior to exposure)- will keep on droplet precautions x 10 days minimum and test Thursday.  Spoke to infection control nurse- went over plan- x10 minutes Also explained not appropriate to drive for at least 2-3 months- will d/w team.   3/3- pt insists daughter to be off precautions as of 3/5- which doesn't correlate with [place] being told last Friday was the day she tested (+)- will need to figure this out for infection control to determine overall plan.   - also wait for pt's COVID test to determine plan   3/4- pt's getting COVID test today- will take up to 24 hours to get results. Is asymptomatic Will determine when can send pt home.  3/5- COVID test (-)- still needs another test Monday per ID/infection control- insurance ants pt to go to SNF- a SNF wouldn't take him when he could be COVID (+) up til the 10 days mark. Likely here until March 10th.   3/8- COVID test again today per ID- have ordered 18/ L wrist pain  3/5- sounds and feels like arthritic changes- d/w pt can get xray in future, but I don't think it's anything concerning or bad- just signs of losing cartilage- pt voiced understanding.   3/8- better pain. con't meds 19. Dispo  3/8- family ed by Zoom today- d/c Wednesday.   -went over every question/concern pt had  today.   LOS: 14 days A FACE TO FACE EVALUATION WAS PERFORMED  Rhone Ozaki 02/11/2020, 9:49 AM

## 2020-02-11 NOTE — Plan of Care (Signed)
  Problem: Consults Goal: RH STROKE PATIENT EDUCATION Description: See Patient Education module for education specifics  Outcome: Progressing   Problem: RH BLADDER ELIMINATION Goal: RH STG MANAGE BLADDER WITH ASSISTANCE Description: STG Manage Bladder With Assistance. Min assist Outcome: Progressing   Problem: RH SKIN INTEGRITY Goal: RH STG SKIN FREE OF INFECTION/BREAKDOWN Description: Free of breakdown and infection with Min assist Outcome: Progressing   Problem: RH SAFETY Goal: RH STG ADHERE TO SAFETY PRECAUTIONS W/ASSISTANCE/DEVICE Description: STG Adhere to Safety Precautions With Assistance/Device. min Outcome: Progressing

## 2020-02-12 ENCOUNTER — Encounter (HOSPITAL_COMMUNITY): Payer: PPO | Admitting: Occupational Therapy

## 2020-02-12 ENCOUNTER — Encounter (HOSPITAL_COMMUNITY): Payer: PPO | Admitting: Speech Pathology

## 2020-02-12 ENCOUNTER — Inpatient Hospital Stay (HOSPITAL_COMMUNITY): Payer: PPO | Admitting: Occupational Therapy

## 2020-02-12 ENCOUNTER — Ambulatory Visit (HOSPITAL_COMMUNITY): Payer: PPO

## 2020-02-12 ENCOUNTER — Inpatient Hospital Stay (HOSPITAL_COMMUNITY): Payer: PPO | Admitting: Speech Pathology

## 2020-02-12 MED ORDER — FOLIC ACID 1 MG PO TABS: 1.0000 mg | ORAL_TABLET | Freq: Every day | ORAL | 0 refills | Status: DC

## 2020-02-12 MED ORDER — BUPROPION HCL 75 MG PO TABS: 75.0000 mg | ORAL_TABLET | Freq: Two times a day (BID) | ORAL | 0 refills | Status: DC

## 2020-02-12 MED ORDER — TRAMADOL HCL 50 MG PO TABS: 50.0000 mg | ORAL_TABLET | Freq: Four times a day (QID) | ORAL | 0 refills | Status: DC | PRN

## 2020-02-12 MED ORDER — ACETAMINOPHEN 325 MG PO TABS: 650.0000 mg | ORAL_TABLET | ORAL | Status: DC | PRN

## 2020-02-12 MED ORDER — SENNOSIDES-DOCUSATE SODIUM 8.6-50 MG PO TABS: 2.0000 | ORAL_TABLET | Freq: Every day | ORAL | Status: DC

## 2020-02-12 MED ORDER — ATORVASTATIN CALCIUM 20 MG PO TABS: 20.0000 mg | ORAL_TABLET | Freq: Every day | ORAL | 0 refills | Status: DC

## 2020-02-12 MED ORDER — TRAZODONE HCL 100 MG PO TABS: 100.0000 mg | ORAL_TABLET | Freq: Every day | ORAL | 0 refills | Status: DC

## 2020-02-12 MED ORDER — PANTOPRAZOLE SODIUM 40 MG PO TBEC: 40.0000 mg | DELAYED_RELEASE_TABLET | Freq: Every day | ORAL | 0 refills | Status: DC

## 2020-02-12 MED ORDER — CLOPIDOGREL BISULFATE 75 MG PO TABS: 75.0000 mg | ORAL_TABLET | Freq: Every day | ORAL | 1 refills | Status: DC

## 2020-02-12 NOTE — Progress Notes (Signed)
Physical Therapy Discharge Summary  Patient Details  Name: Ruben Chavez MRN: [id number] Date of Birth: [birth year]  Today's Date: 02/12/2020 PT Individual Time: 9381-0175 PT Individual Time Calculation (min): 30 min    Patient has made significant gains in balance, cognition, gait training, motor control and ability to perform functional mobility tasks. He continues with left UE weakness, perceptual impairment limiting overall independence. Patient has met 9 of 9 long term goals due to improved activity tolerance, improved balance, improved postural control, increased strength, ability to compensate for deficits, improved attention, improved awareness and improved coordination.  Patient to discharge at an ambulatory level Supervision.   Patient's care partner is independent to provide the necessary cognitive assistance at discharge.  Recommendation:  Patient will benefit from ongoing skilled PT services in outpatient setting to continue to advance safe functional mobility, address ongoing impairments in balance, L UE strength/NMR, functional mobility, dynamic gait, activity tolerance, patient/caregiver education, and minimize fall risk.  Equipment: No equipment provided  Reasons for discharge: treatment goals met  Patient/family agrees with progress made and goals achieved: Yes  Skilled Therapeutic Interventions: Patient in w/c in room upon PT arrival, patient's wife present via Zoom call for family education session. Reviewed role of PT and patients progress with functional mobility, gait and stair training, balance (relayed Berg scores), safety awareness, and UE use for functional mobility. Reviewed supervision recommendations for safety and balance in the home. Patient currently requires distant supervision with all mobility except close supervision for stairs and community mobility. Reviewed that patient should wear tennis shoes rather than sandals when out in the community to prevent  falls. Educated on fall risk/prevention, home modifications to prevent falls, and activation of emergency services in the event of a fall. Provided patient with HEP and reviewed exercises with him and his wife. Answered questions about OPPT, limitations/restrictions with driving and going to his boat at this time for safety, and d/c planning for tormorrow. Patient and his wife demonstrate a good understanding of patients current functional level and needs and are prepared for d/c home tomorrow with all questions answered.    PT Discharge Precautions/Restrictions Precautions Precautions: Fall Precaution Comments: L hemi UE>LE Restrictions Weight Bearing Restrictions: No Vision/Perception  Vision - Assessment Eye Alignment: Within Functional Limits Ocular Range of Motion: Within Functional Limits Tracking/Visual Pursuits: Able to track stimulus in all quads without difficulty Saccades: Within functional limits Perception Perception: Within Functional Limits Praxis Praxis: Intact  Cognition Overall Cognitive Status: Within Functional Limits for tasks assessed Arousal/Alertness: Awake/alert Attention: Focused;Sustained Focused Attention: Appears intact Sustained Attention: Impaired Sustained Attention Impairment: Verbal complex;Functional complex Selective Attention: Appears intact Alternating Attention: Impaired Alternating Attention Impairment: Functional complex Memory: Appears intact Awareness: Appears intact Problem Solving: Impaired Problem Solving Impairment: Functional complex Safety/Judgment: Impaired Comments: Safety awareness much improved since admission, patient with intermittent safety concerns with mobility and problem solving Sensation Sensation Light Touch: Impaired Detail Light Touch Impaired Details: Impaired LUE Proprioception: Impaired Detail Proprioception Impaired Details: Impaired LUE Coordination Gross Motor Movements are Fluid and Coordinated: No Fine  Motor Movements are Fluid and Coordinated: No Coordination and Movement Description: L hemi UE>LE Finger Nose Finger Test: poor hand function, fair proximal control LUE Heel Shin Test: WFL Motor  Motor Motor: Hemiplegia Motor - Discharge Observations: ongoing left UE motor deficit distal>proximal  Mobility Bed Mobility Rolling Right: Independent Rolling Left: Independent Supine to Sit: Independent Sitting - Scoot to Edge of Bed: Independent Sit to Supine: Independent Transfers Sit to Stand: Independent Stand to Sit:  Independent Stand Pivot Transfers: Independent Transfer (Assistive device): None Locomotion  Gait Ambulation: Yes Gait Assistance: Supervision/Verbal cueing Gait Distance (Feet): 400 Feet Assistive device: None Gait Assistance Details: Verbal cues for precautions/safety Gait Assistance Details: Requires cues for safety awareness during ambulation Gait Gait: Yes Gait Pattern: Step-through pattern;Decreased stride length;Decreased hip/knee flexion - right;Decreased hip/knee flexion - left;Decreased trunk rotation Gait velocity: decreased High Level Ambulation High Level Ambulation: Side stepping;Backwards walking;Direction changes;Sudden stops Side Stepping: requires supervision for safety with decreased speed Backwards Walking: requires supervision for safety with decreased speed Direction Changes: requires supervision for safety with decreased speed Sudden Stops: requires supervision for safety with decreased speed Stairs / Additional Locomotion Stairs: Yes Stairs Assistance: Supervision/Verbal cueing Stair Management Technique: One rail Right Number of Stairs: 22 Height of Stairs: 6 Ramp: Supervision/Verbal cueing Curb: Supervision/Verbal cueing Wheelchair Mobility Wheelchair Mobility: No(patient is a Engineer, petroleum)  Trunk/Postural Assessment  Cervical Assessment Cervical Assessment: Within Functional Limits Thoracic Assessment Thoracic  Assessment: Within Functional Limits Lumbar Assessment Lumbar Assessment: Within Functional Limits Postural Control Postural Control: Deficits on evaluation Righting Reactions: delayed, however, much improved since admission Protective Responses: delayed, however, much improved since admission  Balance Standardized Balance Assessment Standardized Balance Assessment: Berg Balance Test Berg Balance Test Sit to Stand: Able to stand without using hands and stabilize independently Standing Unsupported: Able to stand safely 2 minutes Sitting with Back Unsupported but Feet Supported on Floor or Stool: Able to sit safely and securely 2 minutes Stand to Sit: Sits safely with minimal use of hands Transfers: Able to transfer safely, minor use of hands Standing Unsupported with Eyes Closed: Able to stand 10 seconds safely Standing Ubsupported with Feet Together: Able to place feet together independently and stand 1 minute safely From Standing, Reach Forward with Outstretched Arm: Can reach confidently >25 cm (10") From Standing Position, Pick up Object from Floor: Able to pick up shoe safely and easily From Standing Position, Turn to Look Behind Over each Shoulder: Looks behind from both sides and weight shifts well Turn 360 Degrees: Able to turn 360 degrees safely in 4 seconds or less Standing Unsupported, Alternately Place Feet on Step/Stool: Able to stand independently and safely and complete 8 steps in 20 seconds Standing Unsupported, One Foot in Front: Able to place foot tandem independently and hold 30 seconds Standing on One Leg: Able to lift leg independently and hold 5-10 seconds Total Score: 55 Static Sitting Balance Static Sitting - Balance Support: No upper extremity supported;Feet unsupported Static Sitting - Level of Assistance: 7: Independent Dynamic Sitting Balance Dynamic Sitting - Level of Assistance: 7: Independent Static Standing Balance Static Standing - Balance Support: No  upper extremity supported;During functional activity Static Standing - Level of Assistance: 7: Independent Dynamic Standing Balance Dynamic Standing - Balance Support: No upper extremity supported;During functional activity Dynamic Standing - Level of Assistance: 7: Independent Extremity Assessment  RUE Assessment RUE Assessment: Within Functional Limits LUE Assessment Passive Range of Motion (PROM) Comments: WFL t/o, mild edema persists wrist and hand Active Range of Motion (AROM) Comments: scapula, shoulder, elbow full ROM against gravity with moderate dysmetria, supination 3/4, pron full, trace wrist and no volitional mobility digits RLE Assessment RLE Assessment: Within Functional Limits LLE Assessment LLE Assessment: Within Functional Limits    Evella Kasal L Lakiesha Ralphs PT, DPT  02/12/2020, 5:01 PM

## 2020-02-12 NOTE — Patient Care Conference (Signed)
Inpatient RehabilitationTeam Conference and Plan of Care Update Date: 02/12/2020   Time: 11:50 AM    Patient Name: Ruben Chavez      Medical Record Number: [id number]  Date of Birth: March 15, 1951 Sex: Male         Room/Bed: 4M13C/4M13C-01 Payor Info: Payor: [employer] / Plan: [employer] PPO / Product Type: *No Product type* /    Admit Date/Time:  01/28/2020  5:43 PM  Primary Diagnosis:  Right middle cerebral artery stroke [facility])  Patient Active Problem List   Diagnosis Date Noted  . Cognitive and neurobehavioral dysfunction   . Hypertensive urgency 01/28/2020  . Urinary retention 01/28/2020  . Right middle cerebral artery stroke ([place]) 01/28/2020  . Stroke (cerebrum) ([place]) -  [name] 01/25/2020  . Acute respiratory failure with hypoxia ([place])   . Hypotension   . Hypokalemia   . Hypocalcemia   . Encephalopathy acute   . Stroke ([place]) 01/24/2020  . Middle cerebral artery embolism, right 01/24/2020  . Hyperlipidemia 02/21/2019  . Impingement syndrome of right shoulder region 02/21/2018  . OA (osteoarthritis) of hip 01/18/2018  . Prostate cancer ([place]) 03/04/2016  . Essential hypertension 11/27/2014  . History of colonic polyps 09/26/2014  . BACK PAIN, LEFT 12/22/2009  . PROSTATE SPECIFIC ANTIGEN, ELEVATED 12/20/2008  . LATERAL EPICONDYLITIS 07/15/2008  . ACTINIC KERATOSIS, FOREHEAD, LEFT 05/23/2008  . WART, LEFT HAND 07/19/2007  . ERECTILE DYSFUNCTION, MILD 07/19/2007  . HERNIA, BILATERAL INGUINAL W/O OBST/GANGRENE 07/19/2007  . MENISCUS TEAR 07/19/2007  . Bilateral inguinal hernia 07/19/2007    Expected Discharge Date: Expected Discharge Date: 02/13/20  Team Members Present: Physician leading conference: Dr. Courtney Heys Care Coodinator Present: Loralee Pacas, LCSWA;Genie Ashantia Amaral, RN, MSN Nurse Present: Other (comment)(Marie Poynter, RN) PT  Present: Apolinar Junes, PT OT Present: Elisabeth Most, OT SLP Present: Colon Flattery, SLP PPS Coordinator present : Gunnar Fusi, SLP     Current Status/Progress Goal Weekly Team Focus  Bowel/Bladder   Continent of B&B. LBM 3/8  Remain continent  assess q shift/prn   Swallow/Nutrition/ Hydration             ADL's   CS/set up for adl, shower, functional transfers and mobility.  he continues to require occ cues for visual spatial and sequencing,  CS/mod I  family education, L NMRE   Mobility   Supervision-mod I overall, gait >400 ft and 12 steps with supervision  Supervision-mod I overall  D/c planning, dynamic gait and balance, safety awareness, stait training, patient/caregiver education.   Communication   Mod I  Mod I  Family Information systems manager Observations  Supervision  Supervision  Family Education   Pain   c/o pain LUE  Pain 3 or less  assess q shift/prn   Skin   Incision to chest (skin glue;open to air)  Prevent further skin breakdown  assess q shift/prn    Rehab Goals Patient on target to meet rehab goals: Yes *See Care Plan and progress notes for long and short-term goals.     Barriers to Discharge  Current Status/Progress Possible Resolutions Date Resolved   Nursing                  PT     None, patient is able to manage stairs at home, patient's wife is participating in family education today, 02/12/20, and can provide 24/7 supervision at home.  OT                  SLP                SW Decreased caregiver support;Lack of/limited family support Caregiver is not physically able to lift pt; PRN support from children            Discharge Planning/Teaching Needs:     Family training as recommended by therapy   Team Discussion: Negative for Covid X2, loop recorder to be checked, tramadol for pain, rash resolved.  RN up to bathroom.  OT edema in hand down, distant S self care.  PT working on higher level activity, amb 400',  needs distant S.  SLP S goals met.  Has a wife.   Revisions to Treatment Plan: N/A     Medical Summary Current Status: no pain complaints; Supervision to bathroom; continent B/B Weekly Focus/Goal: OT_ edema in hand- no Ktape since last week; elevation no tape needed; distant supervision level currently- takes time/problem solving  Barriers to Discharge: Decreased family/caregiver support;Home enviroment access/layout;Weight  Barriers to Discharge Comments: N/A d/c tomorrow Possible Resolutions to Barriers: dynamic gait; done obstacle courses as well- supervision level- no Assistive device; 476f in room; good for d/c- can be distant supervision for goals   Continued Need for Acute Rehabilitation Level of Care: The patient requires daily medical management by a physician with specialized training in physical medicine and rehabilitation for the following reasons: Direction of a multidisciplinary physical rehabilitation program to maximize functional independence : Yes Medical management of patient stability for increased activity during participation in an intensive rehabilitation regime.: Yes Analysis of laboratory values and/or radiology reports with any subsequent need for medication adjustment and/or medical intervention. : Yes   I attest that I was present, lead the team conference, and concur with the assessment and plan of the team.   LRetta Diones3/09/2020, 12:19 PM   Team conference was held via web/ teleconference due to [place]- 19

## 2020-02-12 NOTE — Progress Notes (Signed)
Occupational Therapy Discharge Summary  Patient Details  Name: Ruben Chavez MRN: [id number] Date of Birth: 1951/01/11  Today's Date: 02/12/2020 OT Individual Time: 0913-1030   &  1330-1400 OT Individual Time Calculation (min): 77 min  & 30 min  Am session:  Patient seated in w/c, completed all self care, shower, grooming, dressing with distant supervision and occ set up of environment.  He demonstrates good carryover of strategies to use left UE during functional activity completion.  Increased time to sequence tasks at times.  Functional transfers, bed mobility and ambulation in room DS/mod I.  Completed discharge re-assessment and supine shoulder coordination activities with min cues.  He returned to w/c at close of session, seat belt alarm set and call bell/tray table in reach.    PM session:   Patient seated in w/c, wife present via [place] meeting for family education session.  Reviewed role of OT and patients progress to date related to self care, functional mobility, safety, vision, edema and UE function.  Reviewed set up and supervision recommendations, use of shower seat, positioning of left UE, strategies for safe completion of self care tasks using left UE as support, sensory changes in left UE, visual perceptual skills and activities to assist with improving speed/accuracy.  Provided and practiced left UE HEP.  Patient and wife demonstrate a good understanding of current needs and readiness for discharge to home tomorrow.  Patient has made significant gains in balance, cognition, visual/perceptual skills, motor control and ability to perform self care and mobility tasks.  He continues with left side weakness, perceptual impairment limiting overall independence. Patient has met 10 of 10 long term goals due to improved activity tolerance, improved balance, postural control, functional use of  LEFT upper and LEFT lower extremity, improved attention, improved awareness and improved coordination.   Patient to discharge at overall Modified Independent / distant supervision level.  Patient's care partner is independent to provide the necessary physical and cognitive assistance at discharge.    Reasons goals not met: na  Recommendation:  Patient will benefit from ongoing skilled OT services in outpatient setting to continue to advance functional skills in the area of BADL, iADL and Reduce care partner burden.  Equipment: No equipment provided  Reasons for discharge: treatment goals met  Patient/family agrees with progress made and goals achieved: Yes  OT Discharge Precautions/Restrictions  Precautions Precautions: Fall Precaution Comments: L hemi UE>LE Restrictions Weight Bearing Restrictions: No General   Vital Signs Therapy Vitals Pulse Rate: 72 BP: 127/86 Patient Position (if appropriate): Sitting Pain Pain Assessment Pain Scale: 0-10 Pain Score: 0-No pain ADL ADL Eating: Independent Where Assessed-Eating: Chair Grooming: Modified independent Where Assessed-Grooming: Standing at sink Upper Body Bathing: Setup Where Assessed-Upper Body Bathing: Shower Lower Body Bathing: Setup Where Assessed-Lower Body Bathing: Shower Upper Body Dressing: Setup Where Assessed-Upper Body Dressing: Chair Lower Body Dressing: Setup Where Assessed-Lower Body Dressing: Chair Toileting: Modified independent Where Assessed-Toileting: Glass blower/designer: Diplomatic Services operational officer Method: Counselling psychologist: Energy manager: Distant supervision Social research officer, government Method: Heritage manager: Civil engineer, contracting with back, Grab bars Vision Baseline Vision/History: Wears glasses Wears Glasses: Reading only Patient Visual Report: Other (comment)(denies diplppia at this time) Eye Alignment: Within Functional Limits Ocular Range of Motion: Within Functional Limits Alignment/Gaze Preference: Within Defined  Limits Tracking/Visual Pursuits: Able to track stimulus in all quads without difficulty Saccades: Within functional limits Convergence: Within functional limits Visual Fields: No apparent deficits Perception  Perception: Impaired Inattention/Neglect: Appears  intact  patient attends to left side without cues in controlled environment with limited stimulation, occ cues for position in space deficit, difficulty with returning to correct spot with reading/list of tasks on paper Cognition Overall Cognitive Status: Within Functional Limits for tasks assessed Arousal/Alertness: Awake/alert Orientation Level: Oriented X4 Alternating Attention: Impaired Alternating Attention Impairment: Functional complex Awareness: Appears intact Sensation Sensation Light Touch: Impaired Detail Light Touch Impaired Details: Impaired LUE Hot/Cold: Impaired Detail Hot/Cold Impaired Details: Impaired LUE Proprioception: Impaired Detail Proprioception Impaired Details: Impaired LUE Coordination Gross Motor Movements are Fluid and Coordinated: No Fine Motor Movements are Fluid and Coordinated: No Finger Nose Finger Test: poor hand function, fair proximal control LUE 9 Hole Peg Test: unable left Motor  Motor Motor: Hemiplegia Motor - Discharge Observations: ongoing left UE motor deficit distal>proximal Mobility  Bed Mobility Rolling Right: Independent Rolling Left: Independent Supine to Sit: Independent Sitting - Scoot to Edge of Bed: Independent Sit to Supine: Independent Transfers Sit to Stand: Independent Stand to Sit: Independent  Trunk/Postural Assessment  Postural Control Postural Control: (cues for proximal symmetry/shoulder position and arm swing)  Balance Static Sitting Balance Static Sitting - Level of Assistance: 7: Independent Dynamic Sitting Balance Dynamic Sitting - Level of Assistance: 7: Independent Static Standing Balance Static Standing - Level of Assistance: 7:  Independent Dynamic Standing Balance Dynamic Standing - Level of Assistance: 6: Modified independent (Device/Increase time) Extremity/Trunk Assessment RUE Assessment RUE Assessment: Within Functional Limits LUE Assessment Passive Range of Motion (PROM) Comments: WFL t/o, mild edema persists wrist and hand Active Range of Motion (AROM) Comments: scapula, shoulder, elbow full ROM against gravity with moderate dysmetria, supination 3/4, pron full, trace wrist and no volitional mobility digits   Carlos Levering 02/12/2020, 12:36 PM

## 2020-02-12 NOTE — Progress Notes (Signed)
[facility] PHYSICAL MEDICINE & REHABILITATION PROGRESS NOTE   Subjective/Complaints:  Pt says he's been wearing same underwear and shirt for 1 week- wants wife to bring him new clothes tomorrow.   Also asking about loop recorder- if it's working- reports last week got call that it wasn't working correctly.     ROS: Pt denies SOB, CP , abd pain, N/V/C/D  Objective:   No results found. Recent Labs    02/11/20 0520  WBC 6.0  HGB 10.7*  HCT 32.3*  PLT 471*   Recent Labs    02/11/20 0520  NA 140  K 3.8  CL 108  CO2 24  GLUCOSE 96  BUN 16  CREATININE 0.89  CALCIUM 8.9    Intake/Output Summary (Last 24 hours) at 02/12/2020 1005 Last data filed at 02/12/2020 0758 Gross per 24 hour  Intake 462 ml  Output --  Net 462 ml       Physical Exam: Vital Signs Blood pressure 121/63, pulse (!) 55, temperature 98.5 F (36.9 C), resp. rate 18, height 5\' 11"  (1.803 m), weight 82.3 kg, SpO2 98 %.  Physical Exam Constitutional: Pt is sitting up in manual w/c, in room, watching TV, NAD HEENT: conjugate gaze Cardiovascular: RRR Respiratory/Chest:CTA B/L- good air movement GI/Abdomen: soft, NT< ND< (+)BS  Ext: no clubbing, cyanosis-  L hand trace edema- much improved Musculoskeletal:  Still LUE 3/5 in biceps, triceps, 2/5 WE- no movement of L hand/fingers as of yet. Has grinding sound with constant movement of L wrist- sounds like arthritic changes General: Normal range of motion.  Cervical back:not as tight neck/shoulder muscles Skin: -rash on back looks bette-r less erythema/less red Rash on inner thighs- improved- almost gone  Psychiatric:very talkative and questioning still.   Assessment/Plan: 1. Functional deficits secondary to R MCA stroke with L hemiparesis which require 3+ hours per day of interdisciplinary therapy in a comprehensive inpatient rehab setting.  Physiatrist is providing close team supervision and 24 hour management of active medical problems  listed below.  Physiatrist and rehab team continue to assess barriers to discharge/monitor patient progress toward functional and medical goals  Care Tool:  Bathing  Bathing activity did not occur: Refused Body parts bathed by patient: Chest, Left arm, Abdomen, Face, Front perineal area, Right upper leg, Left upper leg, Right lower leg, Left lower leg, Buttocks, Right arm   Body parts bathed by helper: Right arm     Bathing assist Assist Level: Set up assist     Upper Body Dressing/Undressing Upper body dressing Upper body dressing/undressing activity did not occur (including orthotics): Refused What is the patient wearing?: Pull over shirt    Upper body assist Assist Level: Set up assist    Lower Body Dressing/Undressing Lower body dressing    Lower body dressing activity did not occur: Refused What is the patient wearing?: Pants, Underwear/pull up     Lower body assist Assist for lower body dressing: Set up assist     Toileting Toileting Toileting Activity did not occur (Clothing management and hygiene only): N/A (no void or bm)  Toileting assist Assist for toileting: Supervision/Verbal cueing     Transfers Chair/bed transfer  Transfers assist     Chair/bed transfer assist level: Supervision/Verbal cueing     Locomotion Ambulation   Ambulation assist      Assist level: Supervision/Verbal cueing Assistive device: No Device Max distance: 300'   Walk 10 feet activity   Assist     Assist level: Supervision/Verbal cueing Assistive device:  No Device   Walk 50 feet activity   Assist    Assist level: Supervision/Verbal cueing Assistive device: No Device    Walk 150 feet activity   Assist Walk 150 feet activity did not occur: Safety/medical concerns(distance not tolerated today)  Assist level: Supervision/Verbal cueing Assistive device: No Device    Walk 10 feet on uneven surface  activity   Assist     Assist level: Contact  Guard/Touching assist Assistive device: Other (comment)(no device)   Wheelchair     Assist Will patient use wheelchair at discharge?: No Type of Wheelchair: Manual Wheelchair activity did not occur: N/A         Wheelchair 50 feet with 2 turns activity    Assist            Wheelchair 150 feet activity     Assist          Blood pressure 121/63, pulse (!) 55, temperature 98.5 F (36.9 C), resp. rate 18, height 5\' 11"  (1.803 m), weight 82.3 kg, SpO2 98 %.  Medical Problem List and Plan: 1.Left side weakness with dysarthria and facial droopsecondary to right MCA infarction.Status post loop recorder placement  3/1- pt has new L biceps and triceps movement- over weekend- will con't OT for UB strengthening.   3/2- put on droplet precautions for possible COVID- has been exposed  3/3- has new pronation and supination on LUE  3/5- can use Estim for hand/finger movement- still has difficulties  3/9- no hand movement so far. Everything else coming back well. -patient may shower -ELOS/Goals: 10-14 days, mod I  Continue CIR PT and OT 2. Antithrombotics: -DVT/anticoagulation:Lovenox -antiplatelet therapy: Aspirin 325 mg daily and Plavix 75 mg daily x3 months then aspirin alone 3. Pain Management:Tylenol as needed  2/23- pt took Norco at home- willing to try tramadol 50 mg q6 hours prn ordered- if not effective, will do Norco  2/25- less back pain  3/3- pain not an issue per pt.   3/6, 3/7: pain well controlled.  3/8- no complaints- will send home on Tramadol  4. Mood:Provide emotional support -antipsychotic agents: N/A 5. Neuropsych: This patientiscapable of making decisions on hisown behalf. 6. Skin/Wound Care:Routine skin checks 7. Fluids/Electrolytes/Nutrition:Routine in and outs with follow-up chemistries  2/23- kidney function great and K+ is 3.7- con't to monitor  2/25- check tomorrow   3/1- labs in  AM- Cr 0.95 today.   3/2- labs in process- pending- will address as comes back in.  3/5- labs for Monday ordered to f/u   3/8- Cr and lytes well controlled as above- con't to encourage fluids.  8. Hypertension. Lisinopril 20 mg daily. Monitor with increased mobility  2/25- BP running 0000000 systolic- con't regimen  [id number] bp controlled  3/1- BP running 120s/70s- well controlled- con't regimen  3/3- BP running 110s/70s- well controlled- con't regimen.   3/5- BP 130s/70s- con't regimen  3/6, 3/7: well controllled. Continue current regimen. 9. Hyperlipidemia. Lipitor 10.Prostate cancerwith history of radioactive seed implant. Follow-up outpatient  3/4- can use estim since been 3+ years since treatment 11. Remote tobacco abuse. Continue to provide counseling 12. Constipation- LBM 5 days ago  2/23- will order Senokot-S 2 tabs qAM, and sorbitol and dulcolax suppository prn  2/24- had 2 BMs- feeling better  3/6: having regular BM 13. Neck tightness/myofascial pain  2/23- might benefit from trigger point injections if tramadol not effective  2/24- slept better with his tempurpedic pillow- will con't to monitor  2/26- notes neck pain better-  con't to monitor- can still do injections if needed  3/3- reports doesn't need injections 14/ Rashes- will try cortisone lotion for both and see how it helps BID;  2/26- helping both rashes- con't regime  3/2- Inner thigh rash gone- con't meds for back rash  3/9- rashes all gone- can go home with PRN cortisone 15. LUE edema  2/25- concerned about DVT_ get Dopplers of LUE today and monitor Sx's.   2/26- Dopplers (-)- will get Koban to wrap LUE- by OT or PT- esp hand/fingers and encouraged pt to not let arm dangle.  2/28 reviewed massage and edema mgt techniques   3/1- pt's getting wrapped with kineseotape today to help swelling/edema.   3/2- hand looks 30% better today- con't Ktape  3/4- con't Ktape for swelling-   3/5- almost gone- con't Ktape  as needed  3/9- swelling down to trace swelling. 16. Depression  2/26- pt wants ot restart Wellbutrin- will start 75 mg BID- low dose, esp because (although he knows it's worked) can lower seizure threshold slightly- will monitor for seizures.   2/27- happy to be on wellbutrin. Discussed the fact that his right cortical infarct may lead to emotional lability/PBA. Monitor for now. Might be a candidate for nuedexta if symptoms are ongoing or progressive.   3/8- depression better per pt. Brighter- con't meds 17. COVID exposure-  3/1- will get COIVD test done asap- will wait for additional therapies today until we have COIVD test results.   3/2- wife, who came to visit pt, is (+) in spite of COVID vaccine (2nd one 2 days prior to exposure)- will keep on droplet precautions x 10 days minimum and test Thursday.  Spoke to infection control nurse- went over plan- x10 minutes Also explained not appropriate to drive for at least 2-3 months- will d/w team.   3/3- pt insists daughter to be off precautions as of 3/5- which doesn't correlate with [place] being told last Friday was the day she tested (+)- will need to figure this out for infection control to determine overall plan.   - also wait for pt's COVID test to determine plan   3/4- pt's getting COVID test today- will take up to 24 hours to get results. Is asymptomatic Will determine when can send pt home.  3/5- COVID test (-)- still needs another test Monday per ID/infection control- insurance ants pt to go to SNF- a SNF wouldn't take him when he could be COVID (+) up til the 10 days mark. Likely here until March 10th.   3/8- COVID test again today per ID- have ordered  3/9- 2nd test (-)- will go home Wednesday- doing Zoom family education today. 18/ L wrist pain  3/5- sounds and feels like arthritic changes- d/w pt can get xray in future, but I don't think it's anything concerning or bad- just signs of losing cartilage- pt voiced understanding.   3/8- better  pain. con't meds 19. Dispo  3/8- family ed by Zoom today- d/c Wednesday.   -went over every question/concern pt had today.  3/9- family ed by zoom today- pt d/c tomorrow- checking on loop recorder working and if wife able to nter building tomorrow.   LOS: 15 days A FACE TO FACE EVALUATION WAS PERFORMED  Ruben Chavez 02/12/2020, 10:05 AM

## 2020-02-12 NOTE — Progress Notes (Signed)
Speech Language Pathology Daily Session Notes  Patient Details  Name: Ruben Chavez MRN: [id number] Date of Birth: [birth year]  Today's Date: 02/12/2020  Session 1: SLP Individual Time: [id number] SLP Individual Time Calculation (min): 25 min   Session 2: SLP Individual Time:  [id number] SLP Individual Time Calculation (min): 30 min   Short Term Goals: Week 2: SLP Short Term Goal 1 (Week 2): STGs=LTGs due to ELOS  Skilled Therapeutic Interventions:  Session 1: Skilled treatment session focused on cognitive goals. SLP facilitated session by completing the MoCA-Version 7.1. Patient scored 28/30 points with a score of 26 or above considered normal. Patient appears to be close to his cognitive baseline but continues to demonstrate deficits in higher-level problem solving, attention to his left field of environment and awareness. SLP also facilitated a functional conversation that focused on generating a list of tasks/activities he can complete safely at home since he is currently unable to participate in his usual hobbies ([place] and [place] time on [place] boat). Patient left upright in wheelchair with alarm on and all needs within reach. Continue with current plan of care.   Session 2: Skilled treatment session focused on completion of family education with the patient and his wife via [place]. Patient and his wife were educated in regards to patient's current cognitive functioning and strategies to utilize at home to maximize recall with use of strategies, attention, problem solving and overall safety and independence. Both verbalized understanding and all questions were answered. Patient handed off to OT.    Pain Pain Assessment Pain Scale: 0-10 Pain Score: 0-No pain  Therapy/Group: Individual Therapy  Arti Trang 02/12/2020, 9:59 AM

## 2020-02-12 NOTE — Plan of Care (Signed)
  Problem: Consults Goal: RH STROKE PATIENT EDUCATION Description: See Patient Education module for education specifics  Outcome: Progressing   Problem: RH BOWEL ELIMINATION Goal: RH STG MANAGE BOWEL W/MEDICATION W/ASSISTANCE Description: STG Manage Bowel with Medication with Assistance. Min Outcome: Progressing   Problem: RH BLADDER ELIMINATION Goal: RH STG MANAGE BLADDER WITH ASSISTANCE Description: STG Manage Bladder With Assistance. Min assist Outcome: Progressing   Problem: RH SKIN INTEGRITY Goal: RH STG SKIN FREE OF INFECTION/BREAKDOWN Description: Free of breakdown and infection with Min assist Outcome: Progressing   Problem: RH SAFETY Goal: RH STG ADHERE TO SAFETY PRECAUTIONS W/ASSISTANCE/DEVICE Description: STG Adhere to Safety Precautions With Assistance/Device. min Outcome: Progressing   Problem: RH PAIN MANAGEMENT Goal: RH STG PAIN MANAGED AT OR BELOW PT'S PAIN GOAL Description: Less than 3 Outcome: Progressing

## 2020-02-13 NOTE — Plan of Care (Signed)
  Problem: Consults Goal: RH STROKE PATIENT EDUCATION Description: See Patient Education module for education specifics  Outcome: Progressing   Problem: RH BOWEL ELIMINATION Goal: RH STG MANAGE BOWEL W/MEDICATION W/ASSISTANCE Description: STG Manage Bowel with Medication with Assistance. Min Outcome: Progressing   Problem: RH BLADDER ELIMINATION Goal: RH STG MANAGE BLADDER WITH ASSISTANCE Description: STG Manage Bladder With Assistance. Min assist Outcome: Progressing   Problem: RH SKIN INTEGRITY Goal: RH STG SKIN FREE OF INFECTION/BREAKDOWN Description: Free of breakdown and infection with Min assist Outcome: Progressing   Problem: RH SAFETY Goal: RH STG ADHERE TO SAFETY PRECAUTIONS W/ASSISTANCE/DEVICE Description: STG Adhere to Safety Precautions With Assistance/Device. min Outcome: Progressing   Problem: RH PAIN MANAGEMENT Goal: RH STG PAIN MANAGED AT OR BELOW PT'S PAIN GOAL Description: Less than 3 Outcome: Progressing   Problem: RH KNOWLEDGE DEFICIT Goal: RH STG INCREASE KNOWLEDGE OF STROKE PROPHYLAXIS Description: Patient/spouse will be able to verbalize measures to prevent stroke including medication and lifestyle management with cues/handouts Outcome: Progressing

## 2020-02-13 NOTE — Plan of Care (Signed)
  Problem: Consults Goal: RH STROKE PATIENT EDUCATION Description: See Patient Education module for education specifics  02/13/2020 1020 by Glean Salen, RN Outcome: Adequate for Discharge 02/13/2020 1020 by Glean Salen, RN Outcome: Progressing   Problem: RH BOWEL ELIMINATION Goal: RH STG MANAGE BOWEL W/MEDICATION W/ASSISTANCE Description: STG Manage Bowel with Medication with Assistance. Min 02/13/2020 1020 by Glean Salen, RN Outcome: Adequate for Discharge 02/13/2020 1020 by Glean Salen, RN Outcome: Progressing   Problem: RH BLADDER ELIMINATION Goal: RH STG MANAGE BLADDER WITH ASSISTANCE Description: STG Manage Bladder With Assistance. Min assist 02/13/2020 1020 by Glean Salen, RN Outcome: Adequate for Discharge 02/13/2020 1020 by Glean Salen, RN Outcome: Progressing   Problem: RH SKIN INTEGRITY Goal: RH STG SKIN FREE OF INFECTION/BREAKDOWN Description: Free of breakdown and infection with Min assist 02/13/2020 1020 by Glean Salen, RN Outcome: Adequate for Discharge 02/13/2020 1020 by Glean Salen, RN Outcome: Progressing   Problem: RH SAFETY Goal: RH STG ADHERE TO SAFETY PRECAUTIONS W/ASSISTANCE/DEVICE Description: STG Adhere to Safety Precautions With Assistance/Device. min 02/13/2020 1020 by Glean Salen, RN Outcome: Adequate for Discharge 02/13/2020 1020 by Glean Salen, RN Outcome: Progressing   Problem: RH PAIN MANAGEMENT Goal: RH STG PAIN MANAGED AT OR BELOW PT'S PAIN GOAL Description: Less than 3 02/13/2020 1020 by Glean Salen, RN Outcome: Adequate for Discharge 02/13/2020 1020 by Glean Salen, RN Outcome: Progressing   Problem: RH KNOWLEDGE DEFICIT Goal: RH STG INCREASE KNOWLEDGE OF STROKE PROPHYLAXIS Description: Patient/spouse will be able to verbalize measures to prevent stroke including medication and lifestyle management with cues/handouts 02/13/2020 1020 by Glean Salen, RN Outcome: Adequate for Discharge 02/13/2020 1020 by  Glean Salen, RN Outcome: Progressing   Problem: RH Attention Goal: LTG Patient will demonstrate this level of attention during functional activites (PT) Description: LTG:  Patient will demonstrate this level of attention during functional activites (PT) Outcome: Adequate for Discharge

## 2020-02-13 NOTE — Progress Notes (Signed)
Patient and family received discharged instructions before they left the hospital.

## 2020-02-13 NOTE — Progress Notes (Signed)
Social Work Discharge Note   The overall goal for the admission was met for:   Discharge location: Yes. Discharge to home with wife; 24/7 care (primarily supervision)   Length of Stay: Yes. 16 days  Discharge activity level: Yes. Mod I  Home/community participation: Yes. Limited  Services provided included: MD, RD, PT, OT, SLP, RN, CM, TR, Pharmacy, Neuropsych and SW  Financial Services: Private Insurance: [employer]  Follow-up services arranged: Outpatient: [facility] for PT/OT/ST [phone]  Comments (or additional information): contact pt wife Belenda Cruise [phone]  Patient/Family verbalized understanding of follow-up arrangements: Yes  Individual responsible for coordination of the follow-up plan: Pt will have assistance with coordinating care needs from his wife, and children PRN.   Confirmed correct DME delivered: Rana Snare 02/13/2020    Rana Snare

## 2020-02-13 NOTE — Discharge Instructions (Signed)
Inpatient Rehab Discharge Instructions  Ruben Chavez Discharge date and time: No discharge date for patient encounter.   Activities/Precautions/ Functional Status: Activity: activity as tolerated Diet: regular diet Wound Care: none needed Functional status:  ___ No restrictions     ___ Walk up steps independently ___ 24/7 supervision/assistance   ___ Walk up steps with assistance ___ Intermittent supervision/assistance  ___ Bathe/dress independently ___ Walk with walker     _x__ Bathe/dress with assistance ___ Walk Independently    ___ Shower independently ___ Walk with assistance    ___ Shower with assistance ___ No alcohol     ___ Return to work/school ________   COMMUNITY REFERRALS UPON DISCHARGE:     Outpatient: PT   OT   ST               Agency: [facility]                        Address: [address], [place], [place]                          Phone: [phone]               Appointment Date/Time: Please expect follow-up within 3-5 business days to schedule your appointment. If you have not received follow-up, be sure to call the center.    Special Instructions: No driving smoking or alcohol  Continue aspirin 325 mg daily and Plavix 75 mg daily x3 months then aspirin alone STROKE/TIA DISCHARGE INSTRUCTIONS SMOKING Cigarette smoking nearly doubles your risk of having a stroke & is the single most alterable risk factor  If you smoke or have smoked in the last 12 months, you are advised to quit smoking for your health.  Most of the excess cardiovascular risk related to smoking disappears within a year of stopping.  Ask you doctor about anti-smoking medications  [place] Quit Line: 1-800-QUIT NOW  Free Smoking Cessation Classes [phone]  CHOLESTEROL Know your levels; limit fat & cholesterol in your diet  Lipid Panel     Component Value Date/Time   CHOL 117 01/25/2020 0150   TRIG 135 01/25/2020 0150   HDL 36 (L) 01/25/2020 0150   CHOLHDL 3.3 01/25/2020 0150    VLDL 27 01/25/2020 0150   LDLCALC 54 01/25/2020 0150      Many patients benefit from treatment even if their cholesterol is at goal.  Goal: Total Cholesterol (CHOL) less than 160  Goal:  Triglycerides (TRIG) less than 150  Goal:  HDL greater than 40  Goal:  LDL (LDLCALC) less than 100   BLOOD PRESSURE American Stroke Association blood pressure target is less that 120/80 mm/Hg  Your discharge blood pressure is:  BP: (!) 155/72  Monitor your blood pressure  Limit your salt and alcohol intake  Many individuals will require more than one medication for high blood pressure  DIABETES (A1c is a blood sugar average for last 3 months) Goal HGBA1c is under 7% (HBGA1c is blood sugar average for last 3 months)  Diabetes:     Lab Results  Component Value Date   HGBA1C 5.0 01/25/2020     Your HGBA1c can be lowered with medications, healthy diet, and exercise.  Check your blood sugar as directed by your physician  Call your physician if you experience unexplained or low blood sugars.  PHYSICAL ACTIVITY/REHABILITATION Goal is 30 minutes at least 4 days per week  Activity:  Increase activity slowly, Therapies: Physical Therapy: Home Health Return to work:   Activity decreases your risk of heart attack and stroke and makes your heart stronger.  It helps control your weight and blood pressure; helps you relax and can improve your mood.  Participate in a regular exercise program.  Talk with your doctor about the best form of exercise for you (dancing, walking, swimming, cycling).  DIET/WEIGHT Goal is to maintain a healthy weight  Your discharge diet is:  Diet Order            Diet regular Room service appropriate? Yes; Fluid consistency: Thin  Diet effective now              liquids Your height is:  Height: 5\' 11"  (180.3 cm) Your current weight is: Weight: 82.3 kg Your Body Mass Index (BMI) is:  BMI (Calculated): 25.32  Following the type of diet specifically designed for you  will help prevent another stroke.  Your goal weight range is:    Your goal Body Mass Index (BMI) is 19-24.  Healthy food habits can help reduce 3 risk factors for stroke:  High cholesterol, hypertension, and excess weight.  RESOURCES Stroke/Support Group:  Call [phone]   STROKE EDUCATION PROVIDED/REVIEWED AND GIVEN TO PATIENT Stroke warning signs and symptoms How to activate emergency medical system (call 911). Medications prescribed at discharge. Need for follow-up after discharge. Personal risk factors for stroke. Pneumonia vaccine given:  Flu vaccine given:  My questions have been answered, the writing is legible, and I understand these instructions.  I will adhere to these goals & educational materials that have been provided to me after my discharge from the hospital.      My questions have been answered and I understand these instructions. I will adhere to these goals and the provided educational materials after my discharge from the hospital.  Patient/Caregiver Signature _______________________________ Date __________  Clinician Signature _______________________________________ Date __________  Please bring this form and your medication list with you to all your follow-up doctor's appointments.

## 2020-02-13 NOTE — Progress Notes (Signed)
Speech Language Pathology Discharge Summary  Patient Details  Name: Ruben Chavez MRN: [id number] Date of Birth: 1951-04-14  Patient has met 4 of 4 long term goals.  Patient to discharge at overall Supervision level.   Reasons goals not met: N/A   Clinical Impression/Discharge Summary: Patient has made excellent progress and has met 4 of 4 LTGs this admission. Currently, patient requires overall supervision level verbal cues to complete functional and mildly complex tasks safely in regards problem solving, selective attention and emergent awareness. Patient also demonstrates improved speech intelligibility and is 100% intelligible at the conversation level. Patient and family education is complete and patient will discharge home with 24 hour supervision from family. Patient would benefit from outpatient SLP services to maximize his cognitive functioning and overall functional independence in order to reduce caregiver education.   Care Partner:  Caregiver Able to Provide Assistance: Yes  Type of Caregiver Assistance: Physical;Cognitive  Recommendation:  24 hour supervision/assistance;Outpatient SLP  Rationale for SLP Follow Up: Maximize cognitive function and independence;Reduce caregiver burden   Equipment: N/A   Reasons for discharge: Discharged from hospital;Treatment goals met   Patient/Family Agrees with Progress Made and Goals Achieved: Yes    Gwenette Wellons, [place] 02/13/2020, 6:18 AM

## 2020-02-14 ENCOUNTER — Telehealth: Payer: Self-pay | Admitting: Registered Nurse

## 2020-02-14 NOTE — Telephone Encounter (Signed)
Placed a call to Ruben Chavez, no answer. Left message to return the call.

## 2020-02-15 ENCOUNTER — Other Ambulatory Visit: Payer: Self-pay

## 2020-02-15 ENCOUNTER — Ambulatory Visit: Payer: PPO | Attending: Physician Assistant | Admitting: Physical Therapy

## 2020-02-15 DIAGNOSIS — R414 Neurologic neglect syndrome: Secondary | ICD-10-CM | POA: Insufficient documentation

## 2020-02-15 DIAGNOSIS — R41841 Cognitive communication deficit: Secondary | ICD-10-CM | POA: Diagnosis not present

## 2020-02-15 DIAGNOSIS — R278 Other lack of coordination: Secondary | ICD-10-CM | POA: Diagnosis not present

## 2020-02-15 DIAGNOSIS — I69318 Other symptoms and signs involving cognitive functions following cerebral infarction: Secondary | ICD-10-CM | POA: Insufficient documentation

## 2020-02-15 DIAGNOSIS — R41840 Attention and concentration deficit: Secondary | ICD-10-CM | POA: Insufficient documentation

## 2020-02-15 DIAGNOSIS — I69354 Hemiplegia and hemiparesis following cerebral infarction affecting left non-dominant side: Secondary | ICD-10-CM | POA: Diagnosis not present

## 2020-02-15 DIAGNOSIS — R209 Unspecified disturbances of skin sensation: Secondary | ICD-10-CM | POA: Insufficient documentation

## 2020-02-15 DIAGNOSIS — R471 Dysarthria and anarthria: Secondary | ICD-10-CM | POA: Diagnosis not present

## 2020-02-15 DIAGNOSIS — R2689 Other abnormalities of gait and mobility: Secondary | ICD-10-CM | POA: Diagnosis not present

## 2020-02-15 DIAGNOSIS — R29818 Other symptoms and signs involving the nervous system: Secondary | ICD-10-CM | POA: Insufficient documentation

## 2020-02-15 DIAGNOSIS — R2681 Unsteadiness on feet: Secondary | ICD-10-CM | POA: Diagnosis not present

## 2020-02-15 NOTE — Telephone Encounter (Signed)
Placed another call to [name], no answer. Left message to return the call.

## 2020-02-15 NOTE — Therapy (Signed)
[facility] [address] [place], [place], [address] Phone: [phone]   Fax:  [phone]  Physical Therapy Evaluation  Patient Details  Name: Ruben Chavez MRN: [id number] Date of Birth: 1951-03-16 Referring Provider (PT): Lauraine Rinne, PA-C   Encounter Date: 02/15/2020  PT End of Session - 02/15/20 1034    Visit Number  1    Number of Visits  17    Date for PT Re-Evaluation  05/15/20   written for 60 day POC   Authorization Type  [employer]    PT Start Time  0934    PT Stop Time  1025    PT Time Calculation (min)  51 min    Equipment Utilized During Treatment  Gait belt    Activity Tolerance  Patient tolerated treatment well    Behavior During Therapy  [facility] for tasks assessed/performed       Past Medical History:  Diagnosis Date  . Elevated PSA   . Hyperlipidemia    pt says not anymore  . Hypertension   . Prostate cancer ([place]) 2013    Past Surgical History:  Procedure Laterality Date  . bilateral inguinal hernia  2008  . BUBBLE STUDY  01/28/2020   Procedure: BUBBLE STUDY;  Surgeon: [name], MD;  Location: [facility] ENDOSCOPY;  Service: Cardiovascular;;  . CYST REMOVAL TRUNK  2016   sebaceous cyst  . CYSTOSCOPY  04/22/2016   Procedure: CYSTOSCOPY;  Surgeon: [name], MD;  Location: [facility];  Service: Urology;;  . IR ANGIO VERTEBRAL SEL SUBCLAVIAN INNOMINATE UNI R MOD SED  01/25/2020  . IR CT HEAD LTD  01/25/2020  . IR PERCUTANEOUS ART THROMBECTOMY/INFUSION INTRACRANIAL INC DIAG ANGIO  01/25/2020  . KNEE ARTHROSCOPY  2007   right  . LOOP RECORDER INSERTION N/A 01/28/2020   Procedure: LOOP RECORDER INSERTION;  Surgeon: [name], MD;  Location: [facility] CV LAB;  Service: Cardiovascular;  Laterality: N/A;  . PROSTATE BIOPSY  2008  . PROSTATE BIOPSY  2013  . PROSTATE BIOPSY  01/28/2016  . RADIOACTIVE SEED IMPLANT N/A 04/22/2016   Procedure: RADIOACTIVE  SEED IMPLANT/BRACHYTHERAPY IMPLANT;  Surgeon: [name], MD;  Location: [facility];  Service: Urology;  Laterality: N/A;  . RADIOLOGY WITH ANESTHESIA N/A 01/24/2020   Procedure: IR WITH ANESTHESIA;  Surgeon: [name], MD;  Location: [facility];  Service: Radiology;  Laterality: N/A;  . TEE WITHOUT CARDIOVERSION N/A 01/28/2020   Procedure: TRANSESOPHAGEAL ECHOCARDIOGRAM (TEE);  Surgeon: [name], MD;  Location: [facility] ENDOSCOPY;  Service: Cardiovascular;  Laterality: N/A;  . TONSILLECTOMY    . TOTAL HIP ARTHROPLASTY  2001   left  . TOTAL HIP ARTHROPLASTY Right 01/18/2018   Procedure: RIGHT TOTAL HIP ARTHROPLASTY ANTERIOR APPROACH;  Surgeon: [name], MD;  Location: [facility] ORS;  Service: Orthopedics;  Laterality: Right;    There were no vitals filed for this visit.   Subjective Assessment - 02/15/20 0939    Subjective  Presented 01/24/2020 with acute onset of left-sided weakness facial droop and dysarthria.  Cranial CT scan showed subacute infarction right lateral temporal lobe. Received inpatient rehab in the hospital, just got home on Wednesday 02/13/20. Has difficulty using L hand and arm. L leg has not been bothersome, except yesterday if feels like he strained his L quad. Balance has been fine, no falls since coming home from the hospital. Got a sling from [facility] to help support L arm. When he overdoes it, the  pain is in the L wrist.    Patient is accompained by:  Family member   wife katherine   Pertinent History  hyperlipidemia, hypertension, prostate cancer with radioactive seed implant, remote tobacco abuse.    Diagnostic tests  Cranial CT scan showed subacute infarction right lateral temporal lobe    Patient Stated Goals  wants to get left hand and arm working, wants to improve his balance and changing direction (per wife).    Currently in Pain?  No/denies         [facility] PT Assessment - 02/15/20 0950      Assessment   Medical Diagnosis  R MCA CVA     Referring Provider (PT)  Lauraine Rinne, PA-C    Onset Date/Surgical Date  01/24/20    Hand Dominance  Right    Prior Therapy  CIR       Precautions   Precautions  Fall      Balance Screen   Has the patient fallen in the past 6 months  Yes    How many times?  when he had the CVA    Has the patient had a decrease in activity level because of a fear of falling?   No    Is the patient reluctant to leave their home because of a fear of falling?   No      Home Environment   Living Environment  Private residence    Living Arrangements  Spouse/significant other    Type of [place] to enter    Entrance Stairs-Number of Steps  1    Entrance Stairs-Rails  --    Home Layout  Two level    Alternate Level Stairs-Number of Steps  --   half up and landing and half up   Alternate Level Stairs-Rails  Right;Can reach both    Arnett seat;Cane - single point;Shower seat - built in;Hand held shower head   doesnt use cane, one step over a threshhold in the shower   Additional Comments  pt lives upstairs      Prior Function   Level of Independence  Independent    Le[place] - has 2 Optometrist  Other (comment)   reports difficulty with short term memory     Observation/Other Assessments   Observations  mild swelling L hand/wrist      Sensation   Light Touch  Appears Intact    Hot/Cold  Appears Intact   per pt report      Coordination   Gross Motor Movements are Fluid and Coordinated  Yes      Tone   Assessment Location  Left Upper Extremity      ROM / Strength   AROM / PROM / Strength  Strength      Strength   Strength Assessment Site  Hip;Knee;Ankle    Right/Left Hip  Right;Left    Right Hip Flexion  5/5    Left Hip Flexion  5/5    Right/Left Knee  Right;Left    Right Knee Flexion  5/5    Right Knee Extension  5/5    Left Knee Flexion  5/5    Left Knee Extension  5/5     Right/Left Ankle  Right;Left    Right Ankle Dorsiflexion  5/5    Left Ankle Dorsiflexion  5/5  Transfers   Transfers  Sit to Stand;Stand to Sit    Sit to Stand  5: Supervision;Without upper extremity assist;From chair/3-in-1    Sit to Stand Details (indicate cue type and reason)  pt holding LUE with RUE    Five time sit to stand comments   16.69 seconds     Stand to Sit  Without upper extremity assist;5: Supervision;To chair/3-in-1      Ambulation/Gait   Ambulation/Gait  Yes    Ambulation/Gait Assistance  5: Supervision;4: Min guard    Ambulation/Gait Assistance Details  needed brief min guard due to pt intermittent veering towards the L. pt with more slowed gait speed initially after standing for a prolonged period    Ambulation Distance (Feet)  115 Feet    Assistive device  None    Gait Pattern  Step-through pattern;Decreased arm swing - left;Decreased stance time - left;Decreased stride length    Ambulation Surface  Level;Indoor    Gait velocity  13.16 seconds = 2.50 ft/sec    Stairs  Yes    Stairs Assistance  5: Supervision    Stair Management Technique  One rail Right;Alternating pattern;Forwards      Standardized Balance Assessment   Standardized Balance Assessment  Dynamic Gait Index      Dynamic Gait Index   Level Surface  Mild Impairment    Change in Gait Speed  Mild Impairment    Gait with Horizontal Head Turns  Mild Impairment    Gait with Vertical Head Turns  Mild Impairment    Gait and Pivot Turn  Mild Impairment    Step Over Obstacle  Mild Impairment    Step Around Obstacles  Mild Impairment    Steps  Mild Impairment    Total Score  16    DGI comment:  16/24      LUE Tone   LUE Tone  Hypotonic                Objective measurements completed on examination: See above findings.              PT Education - 02/15/20 1034    Education Details  clinical findings, POC    Person(s) Educated  Patient;Spouse    Methods  Explanation     Comprehension  Verbalized understanding       PT Short Term Goals - 02/15/20 1114      PT SHORT TERM GOAL #1   Title  Pt will be independent with initial HEP in order to build upon functional gains made in therapy. ALL STGS DUE 03/21/20    Time  5   due to delay in scheduling   Period  Weeks    Status  New    Target Date  03/21/20      PT SHORT TERM GOAL #2   Title  Pt will improve DGI score to at least a 20/24 in order to indicate decr fall risk.    Baseline  16/24 on 02/15/20    Time  5    Period  Weeks    Status  New      PT SHORT TERM GOAL #3   Title  Pt will ambulate at least 300' over level surfaces while scanning environment with supervision and no LOB.    Time  5    Period  Weeks    Status  New      PT SHORT TERM GOAL #4   Title  Pt will decr 5x sit <>  stand time to 14 seconds or less in order to indicate improved functional LE strength.    Baseline  16.69 seconds on 02/15/20    Time  5    Period  Weeks    Status  New      PT SHORT TERM GOAL #5   Title  Pt will improve gait speed to at least 2.9 ft/sec in order to demo improved gait efficiency.    Time  5    Period  Weeks    Status  New        PT Long Term Goals - 02/15/20 1117      PT LONG TERM GOAL #1   Title  Pt will be independent with final HEP in order to build upon functional gains made in therapy. ALL LTGS DUE 04/18/20    Time  9   due to delay in scheduling   Period  Weeks    Status  New    Target Date  04/18/20      PT LONG TERM GOAL #2   Title  Perform FGA - with LTG to be written as appropriate.    Time  9    Period  Weeks    Status  New      PT LONG TERM GOAL #3   Title  30 second chair stand goal to be written as appropriate to demo improved LE strength and endurance.    Time  9    Period  Weeks    Status  New      PT LONG TERM GOAL #4   Title  Pt will improve gait speed to at least 3.4 ft/sec in order to demo improved gait efficiency in the community.    Time  9    Period  Weeks     Status  New      PT LONG TERM GOAL #5   Title  Pt will ambulate at least 500' over unlevel surfaces with mod I and scanning environment in order to improve community mobility.    Time  9    Period  Weeks    Status  New      Additional Long Term Goals   Additional Long Term Goals  Yes      PT LONG TERM GOAL #6   Title  Pt will perform 12 steps with no handrail and step through pattern with supervision in order to indicate improved functional LE strength.    Time  9    Period  Weeks    Status  New             Plan - 02/15/20 1036    Clinical Impression Statement  Patient is a 69 year old male referred to Neuro OPPT for evaluation s/p R MCA CVA. Pt hospitalized 01/24/20, received inpatient rehab and was discharged home from hospital on 02/13/20. Pt's PMH is significant for: hyperlipidemia, hypertension, prostate cancer with radioactive seed implant, remote tobacco abuse.. The following deficits were present during the exam: hypotonic LUE, decreased functional LE strength, impaired safety awareness, impaired dynamic balance, swelling in L hand/wrist. Pt would like to be able to get back to [place] and being [place]. Pt's DGI scores indicate pt is at a high risk for falls. Pt's gait speed indicates a limited community ambulatory. Pt would benefit from skilled PT to address these impairments and functional limitations to maximize functional mobility independence and decr fall risk.    Personal  Factors and Comorbidities  Comorbidity 3+;Past/Current Experience   pt's wife will be undergoing a hip replacement beginning of april   Comorbidities  hyperlipidemia, hypertension, prostate cancer with radioactive seed implant, remote tobacco abuse.    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  Stable/Uncomplicated     Clinical Decision Making  Low    Rehab Potential  Good    PT Frequency  2x / week    PT Duration  8 weeks    PT Treatment/Interventions  ADLs/Self Care Home Management;Therapeutic activities;Functional mobility training;Stair training;Gait training;Therapeutic exercise;Balance training;Neuromuscular re-education;Patient/family education    PT Next Visit Plan  review exercises that pt has been performing from inpatient rehab, update/add as needed, functional LE strengthening, balance strategies (pt has a boat and lake house that he would like to get back to going to)    Consulted and Agree with Plan of Care  Family member/caregiver    Family Member Consulted  wife, katherine       Patient will benefit from skilled therapeutic intervention in order to improve the following deficits and impairments:  Abnormal gait, Decreased activity tolerance, Decreased balance, Decreased safety awareness, Difficulty walking, Decreased strength, Impaired UE functional use  Visit Diagnosis: Unsteadiness on feet  Other abnormalities of gait and mobility  Other symptoms and signs involving the nervous system  Hemiplegia and hemiparesis following cerebral infarction affecting left non-dominant side [facility])     Problem List Patient Active Problem List   Diagnosis Date Noted  . Cognitive and neurobehavioral dysfunction   . Hypertensive urgency 01/28/2020  . Urinary retention 01/28/2020  . Right middle cerebral artery stroke ([place]) 01/28/2020  . Stroke (cerebrum) ([place]) -  [name] 01/25/2020  . Acute respiratory failure with hypoxia ([place])   . Hypotension   . Hypokalemia   . Hypocalcemia   . Encephalopathy acute   . Stroke ([place]) 01/24/2020  . Middle cerebral artery embolism, right 01/24/2020  . Hyperlipidemia 02/21/2019  . Impingement syndrome of right shoulder region 02/21/2018  . OA  (osteoarthritis) of hip 01/18/2018  . Prostate cancer ([place]) 03/04/2016  . Essential hypertension 11/27/2014  . History of colonic polyps 09/26/2014  . BACK PAIN, LEFT 12/22/2009  . PROSTATE SPECIFIC ANTIGEN, ELEVATED 12/20/2008  . LATERAL EPICONDYLITIS 07/15/2008  . ACTINIC KERATOSIS, FOREHEAD, LEFT 05/23/2008  . WART, LEFT HAND 07/19/2007  . ERECTILE DYSFUNCTION, MILD 07/19/2007  . HERNIA, BILATERAL INGUINAL W/O OBST/GANGRENE 07/19/2007  . MENISCUS TEAR 07/19/2007  . Bilateral inguinal hernia 07/19/2007    Arliss Journey, PT, DPT  02/15/2020, 11:20 AM  [place] [address] [place], [place], [address] Phone: [phone]   Fax:  [phone]  Name: Ruben Chavez MRN: [id number] Date of Birth: 1951/03/07

## 2020-02-18 ENCOUNTER — Telehealth: Payer: Self-pay

## 2020-02-18 NOTE — Telephone Encounter (Signed)
Patient called stating that he had been taking Lisinopril 20 mg in the hospital and was told by Dr. Dagoberto Ligas to stay on that same dose not what he was on previous to hospital stay but the medication he picked up at the pharmacy was his original Lisinopril 20/25mg . He has been taking that since he left the hospital. Please advise.

## 2020-02-19 ENCOUNTER — Telehealth: Payer: Self-pay

## 2020-02-19 ENCOUNTER — Encounter: Payer: Self-pay | Admitting: Speech Pathology

## 2020-02-19 ENCOUNTER — Ambulatory Visit: Payer: PPO | Admitting: Speech Pathology

## 2020-02-19 ENCOUNTER — Other Ambulatory Visit: Payer: Self-pay

## 2020-02-19 DIAGNOSIS — R471 Dysarthria and anarthria: Secondary | ICD-10-CM

## 2020-02-19 DIAGNOSIS — R2681 Unsteadiness on feet: Secondary | ICD-10-CM | POA: Diagnosis not present

## 2020-02-19 DIAGNOSIS — R41841 Cognitive communication deficit: Secondary | ICD-10-CM

## 2020-02-19 NOTE — Patient Instructions (Signed)
Make sure all of your upcoming appointments for March are programmed in your phone. Set your reminder to go off in enough time for you to prepare to leave.

## 2020-02-19 NOTE — Telephone Encounter (Signed)
Patient called about his medication again. I called him back and left a message stating waiting on a response from Dr. Dagoberto Ligas concerning this matter and that I have sent her an IM to take a look at the message when she gets a chance.

## 2020-02-19 NOTE — Therapy (Signed)
[facility] [address] [place], [place], [address] Phone: [phone]   Fax:  [phone]  Speech Language Pathology Treatment  Patient Details  Name: Ruben Chavez MRN: [id number] Date of Birth: May 29, 1951 Referring Provider (SLP): Lauraine Rinne, PA-C   Encounter Date: 02/19/2020  End of Session - 02/19/20 1304    Visit Number  1    Number of Visits  17    Date for SLP Re-Evaluation  04/19/20    Authorization Type  one copay for multiple disciplines if same day    SLP Start Time  0947   17 min late   SLP Stop Time   1018    SLP Time Calculation (min)  31 min    Activity Tolerance  Patient tolerated treatment well       Past Medical History:  Diagnosis Date  . Elevated PSA   . Hyperlipidemia    pt says not anymore  . Hypertension   . Prostate cancer ([place]) 2013    Past Surgical History:  Procedure Laterality Date  . bilateral inguinal hernia  2008  . BUBBLE STUDY  01/28/2020   Procedure: BUBBLE STUDY;  Surgeon: [name], MD;  Location: [facility] ENDOSCOPY;  Service: Cardiovascular;;  . CYST REMOVAL TRUNK  2016   sebaceous cyst  . CYSTOSCOPY  04/22/2016   Procedure: CYSTOSCOPY;  Surgeon: [name], MD;  Location: [facility];  Service: Urology;;  . IR ANGIO VERTEBRAL SEL SUBCLAVIAN INNOMINATE UNI R MOD SED  01/25/2020  . IR CT HEAD LTD  01/25/2020  . IR PERCUTANEOUS ART THROMBECTOMY/INFUSION INTRACRANIAL INC DIAG ANGIO  01/25/2020  . KNEE ARTHROSCOPY  2007   right  . LOOP RECORDER INSERTION N/A 01/28/2020   Procedure: LOOP RECORDER INSERTION;  Surgeon: [name], MD;  Location: [place] CV LAB;  Service: Cardiovascular;  Laterality: N/A;  . PROSTATE BIOPSY  2008  . PROSTATE BIOPSY  2013  . PROSTATE BIOPSY  01/28/2016  . RADIOACTIVE SEED IMPLANT N/A 04/22/2016   Procedure: RADIOACTIVE SEED IMPLANT/BRACHYTHERAPY IMPLANT;  Surgeon: [name], MD;  Location: [facility];  Service: Urology;  Laterality: N/A;  . RADIOLOGY WITH ANESTHESIA N/A 01/24/2020   Procedure: IR WITH ANESTHESIA;  Surgeon: [name], MD;  Location: [facility];  Service: Radiology;  Laterality: N/A;  . TEE WITHOUT CARDIOVERSION N/A 01/28/2020   Procedure: TRANSESOPHAGEAL ECHOCARDIOGRAM (TEE);  Surgeon: [name], MD;  Location: [facility] ENDOSCOPY;  Service: Cardiovascular;  Laterality: N/A;  . TONSILLECTOMY    . TOTAL HIP ARTHROPLASTY  2001   left  . TOTAL HIP ARTHROPLASTY Right 01/18/2018   Procedure: RIGHT TOTAL HIP ARTHROPLASTY ANTERIOR APPROACH;  Surgeon: [name], MD;  Location: [facility] ORS;  Service: Orthopedics;  Laterality: Right;    There were no vitals filed for this visit.  Subjective Assessment - 02/19/20 0951    Subjective  "I planned an hour but it wasn't enough."    Currently in Pain?  No/denies        SLP Evaluation [facility] - 02/19/20 1306      SLP Visit Information   SLP Received On  02/19/20    Referring Provider (SLP)  Lauraine Rinne, PA-C    Onset Date  01/24/20    Medical Diagnosis  R MCA CVA      Subjective   Subjective  Pt's wife reports pt did not plan enough time to get ready, thus late for today's appointment  Pain Assessment   Currently in Pain?  No/denies      General Information   HPI  Patient is a 69 y.o.right-handed male with history of hyperlipidemia, hypertension, prostate cancer radioactive seed implant, remote tobacco abuse. Presented 01/24/2020 with acute onset of left-sided weakness facial droop and dysarthria. Cranial CT scan showed subacute infarction right lateral temporal lobe. CT angiogram of head and neck embolic occlusion of the superior division M2 branch on the right. Patient did receive TPA. Underwent revascularization procedure per interventional radiology. Loop recorder placed 01/28/2020. Admitted to CIR for therapies 01/29/20-3/110/21.    Behavioral/Cognition  alert, distractible      Balance Screen    Has the patient fallen in the past 6 months  --   see PT eval     Prior Functional Status   Cognitive/Linguistic Baseline  Within functional limits    Type of Home  House     Lives With  Spouse      Cognition   Overall Cognitive Status  Impaired/Different from baseline    Attention  Sustained;Selective    Sustained Attention  Impaired    Sustained Attention Impairment  Verbal complex   difficulty following mod complex verbal instructions   Selective Attention  Impaired    Selective Attention Impairment  --   symbol cancellation impaired (L neglect vs out of time?)   Memory  --   suspect impairments; formal assessment initiated today   Awareness  Impaired    Awareness Impairment  Intellectual impairment;Emergent impairment   pt initially denied impairments (managing schedule/reading)   Problem Solving  Impaired    Problem Solving Impairment  --   could not correct clock drawing errors without cues   Executive Function  Organizing   time management, planning impaired   Organizing  Impaired    Organizing Impairment  Verbal complex   difficulty maintaining topic     Auditory Comprehension   Overall Auditory Comprehension  Appears within functional limits for tasks assessed   attention/?processing impacts mod complex instructions     Visual Recognition/Discrimination   Discrimination  Not tested      Reading Comprehension   Reading Status  Impaired   not formally assessed but wife reports difficulty     Expression   Primary Mode of Expression  Verbal      Verbal Expression   Repetition  No impairment    Naming  No impairment    Pragmatics  Impairment    Impairments  Topic maintenance      Written Expression   Dominant Hand  Right    Written Expression  Not tested      Oral Motor/Sensory Function   Overall Oral Motor/Sensory Function  Other (comment)   not tested due to masking     Motor Speech   Overall Motor Speech  Appears within functional limits for tasks  assessed   mild dysarthria per CIR; appears to be resolving     Standardized Assessments   Standardized Assessments   Cognitive Linguistic Quick Test   to be completed next 1-2 sessions          SLP Education - 02/19/20 1306    Education Details  proposed therapy goals, decreased attention, topic maintenance, organization/planning    Person(s) Educated  Patient;Spouse    Methods  Explanation;Demonstration    Comprehension  Verbalized understanding;Verbal cues required;Need further instruction       SLP Short Term Goals - 02/19/20 1137      SLP SHORT TERM GOAL #  1   Baseline  Patient will use compensatory aids/strategies (electronic or paper) with occasional cues from spouse to be on time for appointments x3 sessions    Time  4    Period  Weeks    Status  New      SLP SHORT TERM GOAL #2   Title  Patient will complete standardized cognitive testing within the first 3 sessions.    Time  2    Period  Weeks    Status  New      SLP SHORT TERM GOAL #3   Title  Patient will demonstrate appropriate topic maintenance over 5 minute conversation with occasional min cues x2 sessions.    Time  4    Period  Weeks    Status  New       SLP Long Term Goals - 02/19/20 1140      SLP LONG TERM GOAL #1   Title  Patient will use compensatory aids/strategies (electronic or paper) to manage schedule, appointments, and daily tasks with rare min A x 4 sessions.    Time  8    Period  Weeks    Status  New      SLP LONG TERM GOAL #2   Title  Pt will ID and correct errors on financial documents with rare min A over 3 sessions    Time  8    Period  Weeks    Status  New      SLP LONG TERM GOAL #3   Title  Patient will demonstrate appropriate topic maintenance over 8 minute conversation with modified independence x 2 sessions.    Time  8    Period  Weeks    Status  New       Plan - 02/19/20 1136    Clinical Impression Statement  [name] presents with what SLP suspects are  mild-moderate cognitive communication deficits; pt arrived 17 minutes late for today's appointment and therefore testing was limited today. Standardized cognitive testing (CLQT) was initiated today; patient demonstrated diminished ability to follow mod complex instructions, impaired selective attention, as well as impaired planning, organization and insight. Wife reports patient struggling with time management at home and is more easily distracted. Patient has reduced awareness/insight into his deficits. I recommend skilled ST to address cognitive communication impairment to maximize safety and independence at home.    Speech Therapy Frequency  2x / week    Duration  --   8 weeks or 17 visits   Treatment/Interventions  Environmental controls;SLP instruction and feedback;Cueing hierarchy;Compensatory techniques;Cognitive reorganization;Functional tasks;Compensatory strategies;Internal/external aids;Multimodal communcation approach;Patient/family education    Potential to Achieve Goals  Good    Consulted and Agree with Plan of Care  Patient;Family member/caregiver       Patient will benefit from skilled therapeutic intervention in order to improve the following deficits and impairments:   Cognitive communication deficit  Dysarthria and anarthria    Problem List Patient Active Problem List   Diagnosis Date Noted  . Cognitive and neurobehavioral dysfunction   . Hypertensive urgency 01/28/2020  . Urinary retention 01/28/2020  . Right middle cerebral artery stroke ([place]) 01/28/2020  . Stroke (cerebrum) ([place]) -  [name] 01/25/2020  . Acute respiratory failure with hypoxia ([place])   . Hypotension   . Hypokalemia   . Hypocalcemia   . Encephalopathy acute   . Stroke [facility])  01/24/2020  . Middle cerebral artery embolism, right 01/24/2020  . Hyperlipidemia 02/21/2019  . Impingement syndrome of  right shoulder region 02/21/2018  . OA (osteoarthritis) of hip 01/18/2018  . Prostate cancer ([place]) 03/04/2016  . Essential hypertension 11/27/2014  . History of colonic polyps 09/26/2014  . BACK PAIN, LEFT 12/22/2009  . PROSTATE SPECIFIC ANTIGEN, ELEVATED 12/20/2008  . LATERAL EPICONDYLITIS 07/15/2008  . ACTINIC KERATOSIS, FOREHEAD, LEFT 05/23/2008  . WART, LEFT HAND 07/19/2007  . ERECTILE DYSFUNCTION, MILD 07/19/2007  . HERNIA, BILATERAL INGUINAL W/O OBST/GANGRENE 07/19/2007  . MENISCUS TEAR 07/19/2007  . Bilateral inguinal hernia 07/19/2007   Deneise Lever, [place], [place] 02/19/2020, 4:19 PM  [facility] [address] [place], [place], [address] Phone: [phone]   Fax:  [phone]   Name: Ruben Chavez MRN: [id number] Date of Birth: [birth year]

## 2020-02-19 NOTE — Telephone Encounter (Signed)
Called pt and assured him that it's fine to go back to the previous Lisinopril and HCTZ combo that he was taking and will see him in clinic in 1-2 weeks.  Can also stop thiamine since his levels were good. Spoke directly to pt.

## 2020-02-22 ENCOUNTER — Ambulatory Visit: Payer: PPO | Admitting: Occupational Therapy

## 2020-02-22 ENCOUNTER — Other Ambulatory Visit: Payer: Self-pay

## 2020-02-22 ENCOUNTER — Telehealth: Payer: Self-pay | Admitting: Occupational Therapy

## 2020-02-22 DIAGNOSIS — R41840 Attention and concentration deficit: Secondary | ICD-10-CM

## 2020-02-22 DIAGNOSIS — R278 Other lack of coordination: Secondary | ICD-10-CM

## 2020-02-22 DIAGNOSIS — R208 Other disturbances of skin sensation: Secondary | ICD-10-CM

## 2020-02-22 DIAGNOSIS — I69354 Hemiplegia and hemiparesis following cerebral infarction affecting left non-dominant side: Secondary | ICD-10-CM

## 2020-02-22 DIAGNOSIS — R2681 Unsteadiness on feet: Secondary | ICD-10-CM | POA: Diagnosis not present

## 2020-02-22 DIAGNOSIS — R414 Neurologic neglect syndrome: Secondary | ICD-10-CM

## 2020-02-22 DIAGNOSIS — I69318 Other symptoms and signs involving cognitive functions following cerebral infarction: Secondary | ICD-10-CM

## 2020-02-22 NOTE — Addendum Note (Signed)
Addended by: Vianne Bulls D on: 02/22/2020 02:20 PM   Modules accepted: Orders

## 2020-02-22 NOTE — Telephone Encounter (Signed)
Dr. Dagoberto Ligas,  Ruben Chavez was seen for OT evaluation today.  He may benefit from electrical stimulation to L wrist/finger extensors and states that he received estim in the hospital.  Please verify if it is ok to do electrical stimulation s/p loop recorder placement.    Thank you,   Vianne Bulls, OTR/L [facility] [address] [place], [place]  [address] [phone] phone [phone] 02/22/20 2:10 PM

## 2020-02-22 NOTE — Therapy (Addendum)
[facility] [address] [place], [place], [address] Phone: [phone]   Fax:  [phone]  Occupational Therapy Evaluation  Patient Details  Name: Ruben Chavez MRN: [id number] Date of Birth: 1950-12-26 Referring Provider (OT): Lauraine Rinne, PA-C   Encounter Date: 02/22/2020  OT End of Session - 02/22/20 1155    Visit Number  1    Number of Visits  25    Date for OT Re-Evaluation  05/22/20    Authorization Type  [employer] (follow Medicare)--1 copay for multiple visits    Authorization - Visit Number  1    Authorization - Number of Visits  10    Progress Note Due on Visit  10    OT Start Time  0933    OT Stop Time  1015    OT Time Calculation (min)  42 min    Activity Tolerance  Patient tolerated treatment well    Behavior During Therapy  [facility] for tasks assessed/performed       Past Medical History:  Diagnosis Date  . Elevated PSA   . Hyperlipidemia    pt says not anymore  . Hypertension   . Prostate cancer ([place]) 2013    Past Surgical History:  Procedure Laterality Date  . bilateral inguinal hernia  2008  . BUBBLE STUDY  01/28/2020   Procedure: BUBBLE STUDY;  Surgeon: [name], MD;  Location: [facility] ENDOSCOPY;  Service: Cardiovascular;;  . CYST REMOVAL TRUNK  2016   sebaceous cyst  . CYSTOSCOPY  04/22/2016   Procedure: CYSTOSCOPY;  Surgeon: [name], MD;  Location: [facility];  Service: Urology;;  . IR ANGIO VERTEBRAL SEL SUBCLAVIAN INNOMINATE UNI R MOD SED  01/25/2020  . IR CT HEAD LTD  01/25/2020  . IR PERCUTANEOUS ART THROMBECTOMY/INFUSION INTRACRANIAL INC DIAG ANGIO  01/25/2020  . KNEE ARTHROSCOPY  2007   right  . LOOP RECORDER INSERTION N/A 01/28/2020   Procedure: LOOP RECORDER INSERTION;  Surgeon: [name], MD;  Location: [facility];  Service: Cardiovascular;  Laterality: N/A;  . PROSTATE BIOPSY  2008  . PROSTATE BIOPSY  2013  . PROSTATE BIOPSY   01/28/2016  . RADIOACTIVE SEED IMPLANT N/A 04/22/2016   Procedure: RADIOACTIVE SEED IMPLANT/BRACHYTHERAPY IMPLANT;  Surgeon: [name], MD;  Location: [facility];  Service: Urology;  Laterality: N/A;  . RADIOLOGY WITH ANESTHESIA N/A 01/24/2020   Procedure: IR WITH ANESTHESIA;  Surgeon: [name], MD;  Location: [place];  Service: Radiology;  Laterality: N/A;  . TEE WITHOUT CARDIOVERSION N/A 01/28/2020   Procedure: TRANSESOPHAGEAL ECHOCARDIOGRAM (TEE);  Surgeon: [name], MD;  [facility];  Service: Cardiovascular;  Laterality: N/A;  . TONSILLECTOMY    . TOTAL HIP ARTHROPLASTY  2001   left  . TOTAL HIP ARTHROPLASTY Right 01/18/2018   Procedure: RIGHT TOTAL HIP ARTHROPLASTY ANTERIOR APPROACH;  Surgeon: [name], MD;  Location: [facility] ORS;  Service: Orthopedics;  Laterality: Right;    There were no vitals filed for this visit.  Subjective Assessment - 02/22/20 0937    Subjective   wife reports that she will have hip replacement surgery 03/12/20    Pertinent History  PMH:  hyperlipidemia, hypertension, prostate cancer radioactive seed implant 2017, R total hip arthroplasty, loop recorder insertion    Limitations  loop recorder    Patient Stated Goals  get my L hand and fingers working    Currently in Pain?  No/denies  [facility] OT Assessment - 02/22/20 0001      Assessment   Medical Diagnosis  R MCA CVA    Referring Provider (OT)  Lauraine Rinne, PA-C    Onset Date/Surgical Date  01/24/20    Hand Dominance  Right    Prior Therapy  CIR       Precautions   Precautions  Fall    Precaution Comments  no driving, loop recorder      Balance Screen   Has the patient fallen in the past 6 months  No   fall with CVA     Home  Environment   Family/patient expects to be discharged to:  Private residence    Lives With  Spouse      Prior Function   Level of [place]  Retired    Le[place]  2 [place]      ADL   Eating/Feeding  Needs assist with cutting food    Grooming  Modified independent    Upper Body Bathing  Modified independent    Lower Body Bathing  Modified independent    Upper Body Dressing  Needs assist for fasteners    Lower Body Dressing  Needs assist for fasteners   and occasional assist for socks, has elastic shoelaces    Toilet Transfer  Modified independent    Toileting - Clothing Manipulation  Modified independent    [place] Transfer  Modified independent      IADL   Prior Level of Function Light Housekeeping  uses handheld vacuum amd takes out trash,  wife performs most     Prior Level of Function Meal Prep  wife performed, microwaves/snack prep    Prior Level of Function Community Mobility  independent    Community Mobility  Relies on family or friends for transportation    Medication Management  Is responsible for taking medication in correct dosages at correct time    Financial Management  --   pt/wife reports pt handling cellphone set-up/changes     Mobility   Mobility Status  Independent      Written Expression   Dominant Hand  Right      Vision - History   Baseline Vision  Wears glasses only for reading   progressive lenses   Additional Comments  diplopia initially, but possible difficulty with reading acuity (hasn't had vision checked in 2 years and plans to go to eye doctor)      [place] not tested    Comment  Vision to be assessed in functional context prn, pt does demo L inattention with LUE      Cognition   Overall Cognitive Status  Impaired/Different from baseline    Sustained Attention  Impaired    Sustained Attention Impairment  --   needed re-direction, cueing at times for complex directions   Memory  Impaired   per pt    Awareness  Impaired    Awareness Impairment  Intellectual impairment;Emergent impairment    Problem Solving  Impaired     Behaviors  Poor frustration tolerance   during eval, seems resistant to strategies/change     Sensation   Light Touch  Impaired Detail   LUE   Proprioception  Impaired by gross assessment      Coordination   Gross Motor Movements are Fluid and Coordinated  No  Fine Motor Movements are Fluid and Coordinated  --   unable   9 Hole Peg Test  Right;Left    Left 9 Hole Peg Test  no AROM L hand/wrist      Perception   Perception  Impaired    Inattention/Neglect  --   decr attention to LUE during session     Edema   Edema  moderate edema in L hand (improved during session with proper positioning), significant edema in L thumb.  Pt arrived weaing LUE in sling and with L thumb through loop in sling.  L thumb with incr swelling due to loop and with blue/small bruised area at base of thumb volarly, marking from loop with incr edema in thumb distal to loop.        ROM / Strength   AROM / PROM / Strength  AROM;Strength      AROM   Overall AROM   Deficits    Overall AROM Comments  LUE shoulder flex/abduction/ER grossly WNL, IR 90%, elbow flex/ext WNL, supination to neutral (mostly by shoulder), no active pronation, wrist movement, or finger/thumb movement      Strength   Overall Strength Comments  L shoulder grossly 3+ to 4-/5, biceps/triceps grossly 4/5      Hand Function   Left Hand Gross Grasp  Impaired               OT Treatments/Exercises (OP) - 02/22/20 0001      Splinting   Splinting  pt fitted for L wrist cock-up splint for improved positioning and edema management.  Pt/wife instructed in splint wear/care            OT Education - 02/22/20 1121    Education Details  Edema management techniques:  edema massage, elevation, wrist cock-up splint (splint wear/care--pt instructed to wear most of day, monitor skin, make sure that straps aren't too tight, and remove multiple times a day for gentle wrist ROM), positioning of LUE to decr risk of shoulder pain (avoid IR  when possible and avoid use of sling when possible when sitting.  Pt also instructed not to put thumb through thumb loop in sling due to incr edema, risk of injury, and small bruise on thumb from loop.  Recommended placing hand in pocket of jacket/vest when wearing splint or in sling with splint without thumb loop if needed for shoulder pain while standing only).  OT eval results/POC.    Person(s) Educated  Patient;Spouse    Methods  Explanation;Demonstration;Verbal cues    Comprehension  Verbalized understanding;Returned demonstration;Verbal cues required       OT Short Term Goals - 02/22/20 1209      OT SHORT TERM GOAL #1   Title  Pt will be independent with initial HEP--check STG 04/04/20    Time  6    Period  Weeks    Status  New      OT SHORT TERM GOAL #2   Title  Pt will verbalize understanding of proper positioning of LUE to prevent pain/injury and edema management techniques.    Time  6    Period  Weeks    Status  New      OT SHORT TERM GOAL #3   Title  Pt will be independent with splint wear/care for improved positioning.    Time  6    Period  Weeks    Status  New      OT SHORT TERM GOAL #4   Title  Pt will  be able to use LUE as a stabilizer for simple ADL tasks.    Time  6    Period  Weeks    Status  New      OT SHORT TERM GOAL #5   Title  Pt will demo at least 25% gross finger flex in prep for functional grasp.    Baseline  6    Time  4    Period  Weeks    Status  New        OT Long Term Goals - 02/22/20 1228      OT LONG TERM GOAL #1   Title  Pt will be independent with updated HEP.--check LTGs 05/22/20    Time  12    Period  Weeks    Status  New      OT LONG TERM GOAL #2   Title  Pt will be able to use LUE as nondominant assist for ADLs at least 50% of the time.    Time  12    Period  Weeks    Status  New      OT LONG TERM GOAL #3   Title  Pt will be able to score at least 10 on box and blocks test with LUE for improved coordination for ADLs.     Time  12    Period  Weeks    Status  New      OT LONG TERM GOAL #4   Title  Pt will complete clothing fasteners mod I using AE prn.    Time  12    Period  Weeks    Status  New      OT LONG TERM GOAL #5   Title  Assess environmental scanning and set goal as appropriate.    Time  12    Period  Weeks    Status  New      Long Term Additional Goals   Additional Long Term Goals  Yes      OT LONG TERM GOAL #6   Title  Pt will demo at least 75% gross finger flex/ext for grasp/release of objects.    Time  12    Period  Weeks    Status  New            Plan - 02/22/20 1147    Clinical Impression Statement  Pt is a 69 y.o. male s/p CVA 01/24/20 (hospitalized 01/24/20-02/13/20 including CIR).  Pt with PMH that includes:hyperlipidemia, hypertension, prostate cancer radioactive seed implant 2017, R total hip arthroplasty, loop recorder insertion.  Pt was independent prior to CVA.  Pt now is unable to drive and cannot use L hand functionally affecting ability to perform prior ADLs/IADLs.  Pt presents today with L hemiparesis with decr strength, decr LUE functional use, decr sensation, edema, L inattention/perceptual deficits, possible visual-spatial deficits, decr balance/ifunctional mobility, decr strength, decr coordination, and cognitive deficits. Pt would benefit from occupational therapy to address these deficits for incr LUE functional use, incr independence with ADLs/IADLs, decr risk of future complications.    OT Occupational Profile and History  Detailed Assessment- Review of Records and additional review of physical, cognitive, psychosocial history related to current functional performance    Occupational performance deficits (Please refer to evaluation for details):  ADL's;IADL's;Leisure;Social Participation    Body Structure / Function / Physical Skills  ADL;Dexterity;ROM;IADL;Sensation;Mobility;Strength;FMC;Coordination;Decreased knowledge of precautions;Decreased knowledge of use of  DME;Pain;UE functional use;GMC;Proprioception    Cognitive Skills  Attention;Memory;Perception;Safety Awareness    Rehab  Potential  Good    Clinical Decision Making  Several treatment options, min-mod task modification necessary    Comorbidities Affecting Occupational Performance:  May have comorbidities impacting occupational performance    Modification or Assistance to Complete Evaluation   Min-Moderate modification of tasks or assist with assess necessary to complete eval    OT Frequency  2x / week    OT Duration  12 weeks   +eval   OT Treatment/Interventions  Self-care/ADL training;Moist Heat;Fluidtherapy;DME and/or AE instruction;Splinting;Therapeutic activities;Contrast Bath;Aquatic Therapy;Ultrasound;Therapeutic exercise;Cognitive remediation/compensation;Visual/perceptual remediation/compensation;Passive range of motion;Functional Mobility Training;Neuromuscular education;Cryotherapy;Electrical Stimulation;Paraffin;Energy conservation;Manual Therapy;Patient/family education    Plan  L hand compression glove, check wrist cock up splint, further education for positioning of LUE, initiate HEP, check to see if received clearance from MD for estim    Consulted and Agree with Plan of Care  Patient;Family member/caregiver    Family Member Consulted  wife       Patient will benefit from skilled therapeutic intervention in order to improve the following deficits and impairments:   Body Structure / Function / Physical Skills: ADL, Dexterity, ROM, IADL, Sensation, Mobility, Strength, FMC, Coordination, Decreased knowledge of precautions, Decreased knowledge of use of DME, Pain, UE functional use, GMC, Proprioception Cognitive Skills: Attention, Memory, Perception, Safety Awareness     Visit Diagnosis: Hemiplegia and hemiparesis following cerebral infarction affecting left non-dominant side ([place]) - Plan: Ot plan of care cert/re-cert  Other disturbances of skin sensation - Plan: Ot plan of care  cert/re-cert  Other lack of coordination - Plan: Ot plan of care cert/re-cert  Unsteadiness on feet - Plan: Ot plan of care cert/re-cert  Other symptoms and signs involving cognitive functions following cerebral infarction - Plan: Ot plan of care cert/re-cert  Attention and concentration deficit - Plan: Ot plan of care cert/re-cert  Neurologic neglect syndrome - Plan: Ot plan of care cert/re-cert    Problem List Patient Active Problem List   Diagnosis Date Noted  . Cognitive and neurobehavioral dysfunction   . Hypertensive urgency 01/28/2020  . Urinary retention 01/28/2020  . Right middle cerebral artery stroke ([place]) 01/28/2020  . Stroke (cerebrum) ([place]) -  [name] 01/25/2020  . Acute respiratory failure with hypoxia ([place])   . Hypotension   . Hypokalemia   . Hypocalcemia   . Encephalopathy acute   . Stroke ([place]) 01/24/2020  . Middle cerebral artery embolism, right 01/24/2020  . Hyperlipidemia 02/21/2019  . Impingement syndrome of right shoulder region 02/21/2018  . OA (osteoarthritis) of hip 01/18/2018  . Prostate cancer ([place]) 03/04/2016  . Essential hypertension 11/27/2014  . History of colonic polyps 09/26/2014  . BACK PAIN, LEFT 12/22/2009  . PROSTATE SPECIFIC ANTIGEN, ELEVATED 12/20/2008  . LATERAL EPICONDYLITIS 07/15/2008  . ACTINIC KERATOSIS, FOREHEAD, LEFT 05/23/2008  . WART, LEFT HAND 07/19/2007  . ERECTILE DYSFUNCTION, MILD 07/19/2007  . HERNIA, BILATERAL INGUINAL W/O OBST/GANGRENE 07/19/2007  . MENISCUS TEAR 07/19/2007  . Bilateral inguinal hernia 07/19/2007    [facility] 02/22/2020, 2:13 PM  [facility] [address] [place], [place], [address] Phone: [phone]   Fax:  [phone]  Name: Ruben Chavez MRN: [id number] Date of Birth: May 21, 1951   Vianne Bulls, OTR/L [facility] [address] [place], [place]  [address] [phone] phone [phone] 02/22/20 2:13 PM

## 2020-02-22 NOTE — Telephone Encounter (Signed)
It is OK to do estim, as long as it's not over loop recorder, of course. Thank you for asking, ML

## 2020-02-25 ENCOUNTER — Encounter: Payer: PPO | Admitting: Occupational Therapy

## 2020-02-25 ENCOUNTER — Other Ambulatory Visit: Payer: Self-pay

## 2020-02-25 ENCOUNTER — Encounter: Payer: Self-pay | Admitting: Physical Medicine and Rehabilitation

## 2020-02-25 ENCOUNTER — Ambulatory Visit: Payer: PPO | Admitting: Occupational Therapy

## 2020-02-25 ENCOUNTER — Encounter: Payer: PPO | Attending: Physical Medicine and Rehabilitation | Admitting: Physical Medicine and Rehabilitation

## 2020-02-25 VITALS — BP 106/70 | HR 66 | Temp 97.7°F | Ht 71.0 in | Wt 173.0 lb

## 2020-02-25 DIAGNOSIS — F09 Unspecified mental disorder due to known physiological condition: Secondary | ICD-10-CM | POA: Diagnosis not present

## 2020-02-25 DIAGNOSIS — M545 Low back pain, unspecified: Secondary | ICD-10-CM | POA: Insufficient documentation

## 2020-02-25 DIAGNOSIS — G8929 Other chronic pain: Secondary | ICD-10-CM | POA: Diagnosis not present

## 2020-02-25 DIAGNOSIS — I69354 Hemiplegia and hemiparesis following cerebral infarction affecting left non-dominant side: Secondary | ICD-10-CM

## 2020-02-25 DIAGNOSIS — I69398 Other sequelae of cerebral infarction: Secondary | ICD-10-CM | POA: Diagnosis not present

## 2020-02-25 DIAGNOSIS — F0789 Other personality and behavioral disorders due to known physiological condition: Secondary | ICD-10-CM

## 2020-02-25 DIAGNOSIS — R252 Cramp and spasm: Secondary | ICD-10-CM | POA: Diagnosis not present

## 2020-02-25 DIAGNOSIS — R208 Other disturbances of skin sensation: Secondary | ICD-10-CM

## 2020-02-25 DIAGNOSIS — I63511 Cerebral infarction due to unspecified occlusion or stenosis of right middle cerebral artery: Secondary | ICD-10-CM | POA: Diagnosis not present

## 2020-02-25 DIAGNOSIS — R41840 Attention and concentration deficit: Secondary | ICD-10-CM

## 2020-02-25 DIAGNOSIS — R2681 Unsteadiness on feet: Secondary | ICD-10-CM | POA: Diagnosis not present

## 2020-02-25 NOTE — Therapy (Signed)
[facility] [address] [place], [place], [address] Phone: [phone]   Fax:  [phone]  Occupational Therapy Treatment  Patient Details  Name: Ruben Chavez MRN: [id number] Date of Birth: 01-02-1951 Referring Provider (OT): Lauraine Rinne, PA-C   Encounter Date: 02/25/2020  OT End of Session - 02/25/20 1218    Visit Number  2    Number of Visits  17    Date for OT Re-Evaluation  04/22/20    Authorization Type  [employer] (follow Medicare)--1 copay for multiple visits    Authorization Time Period  cert date 9/93/71-6/96/78    Authorization - Visit Number  2    Authorization - Number of Visits  10    Progress Note Due on Visit  10    OT Start Time  0935    OT Stop Time  1020    OT Time Calculation (min)  45 min    Activity Tolerance  Patient tolerated treatment well    Behavior During Therapy  [facility] for tasks assessed/performed       Past Medical History:  Diagnosis Date  . Elevated PSA   . Hyperlipidemia    pt says not anymore  . Hypertension   . Prostate cancer ([place]) 2013    Past Surgical History:  Procedure Laterality Date  . bilateral inguinal hernia  2008  . BUBBLE STUDY  01/28/2020   Procedure: BUBBLE STUDY;  Surgeon: [name], MD;  Location: [facility];  Service: Cardiovascular;;  . CYST REMOVAL TRUNK  2016   sebaceous cyst  . CYSTOSCOPY  04/22/2016   Procedure: CYSTOSCOPY;  Surgeon: [name], MD;  Location: [facility];  Service: Urology;;  . IR ANGIO VERTEBRAL SEL SUBCLAVIAN INNOMINATE UNI R MOD SED  01/25/2020  . IR CT HEAD LTD  01/25/2020  . IR PERCUTANEOUS ART THROMBECTOMY/INFUSION INTRACRANIAL INC DIAG ANGIO  01/25/2020  . KNEE ARTHROSCOPY  2007   right  . LOOP RECORDER INSERTION N/A 01/28/2020   Procedure: LOOP RECORDER INSERTION;  Surgeon: [name], MD;  Location: [place] [facility];  Service: Cardiovascular;  Laterality: N/A;  . PROSTATE BIOPSY   2008  . PROSTATE BIOPSY  2013  . PROSTATE BIOPSY  01/28/2016  . RADIOACTIVE SEED IMPLANT N/A 04/22/2016   Procedure: RADIOACTIVE SEED IMPLANT/BRACHYTHERAPY IMPLANT;  Surgeon: [name], MD;  Location: [facility];  Service: Urology;  Laterality: N/A;  . RADIOLOGY WITH ANESTHESIA N/A 01/24/2020   Procedure: IR WITH ANESTHESIA;  Surgeon: [name], MD;  Location: [place];  Service: Radiology;  Laterality: N/A;  . TEE WITHOUT CARDIOVERSION N/A 01/28/2020   Procedure: TRANSESOPHAGEAL ECHOCARDIOGRAM (TEE);  Surgeon: [name], MD;  Location: [facility]COPY;  Service: Cardiovascular;  Laterality: N/A;  . TONSILLECTOMY    . TOTAL HIP ARTHROPLASTY  2001   left  . TOTAL HIP ARTHROPLASTY Right 01/18/2018   Procedure: RIGHT TOTAL HIP ARTHROPLASTY ANTERIOR APPROACH;  Surgeon: [name], MD;  Location: [facility];  Service: Orthopedics;  Laterality: Right;    There were no vitals filed for this visit.  Subjective Assessment - 02/25/20 0939    Subjective   Pain comes and goes in the wrist/hand    Pertinent History  PMH:  hyperlipidemia, hypertension, prostate cancer radioactive seed implant 2017, R total hip arthroplasty, loop recorder insertion    Limitations  loop recorder    Patient Stated Goals  get my L hand and fingers working    Currently in Pain?  No/denies       Estim cleared from MD - see telephone encounter Reviewed brace wear and care. Issued bed positioning handout and reviewed. Pt reluctant to modify bed positioning/modifications.  Discussed edema management strategies including elevation above heart, massage, ROM, and compression glove - issued glove and proper donning/doffing and wear and care w/ patient and wife.  Reviewed HEP from hospital for shoulder w/ modifications prn and focus on trying to prevent sh abd and IR and to encourage forearm neutral rotation as able (pt goes into pronation).  Issued HEP today for forearm supination, wrist ext in  gravity elim plane, and passive finger and thumb ROM - see pt instructions. Pt w/ difficulty distinguishing b/t forearm supination and wrist ext ex, however becomes frustrated and irritated when wife tries to further explain or correct him.  Pt will need further review as pt's wife reports he does HEP by himself and won't let her help him.                     OT Education - 02/25/20 1018    Education Details  Bed positioning, compression glove wear and care, review of wrist brace wear and care, review of shoulder ex's issued at hospital (proper positioning with this), HEP for forearm supination, wrist ext (AA/ROM), and passive finger flexion    Person(s) Educated  Patient;Spouse    Methods  Explanation;Demonstration;Verbal cues    Comprehension  Verbalized understanding;Returned demonstration;Verbal cues required;Need further instruction       OT Short Term Goals - 02/25/20 1218      OT SHORT TERM GOAL #1   Title  Pt will be independent with initial HEP--check STG 03/24/20    Time  4    Period  Weeks    Status  On-going      OT SHORT TERM GOAL #2   Title  Pt will verbalize understanding of proper positioning of LUE to prevent pain/injury and edema management techniques.    Time  4    Period  Weeks    Status  On-going      OT SHORT TERM GOAL #3   Title  Pt will be independent with splint wear/care for improved positioning.    Time  4    Period  Weeks    Status  On-going      OT SHORT TERM GOAL #4   Title  Pt will be able to use LUE as a stabilizer for simple ADL tasks.    Time  4    Period  Weeks    Status  New      OT SHORT TERM GOAL #5   Title  Pt will demo at least 25% gross finger flex in prep for functional grasp.    Baseline  4    Time  4    Period  Weeks    Status  New        OT Long Term Goals - 02/22/20 1228      OT LONG TERM GOAL #1   Title  Pt will be independent with updated HEP.--check LTGs 04/23/20    Time  8    Period  Weeks    Status   New      OT LONG TERM GOAL #2   Title  Pt will be able to use LUE as nondominant assist for ADLs at least 50% of the time.    Time  8    Period  Weeks  Status  New      OT LONG TERM GOAL #3   Title  Pt will be able to score at least 10 on box and blocks test with LUE for improved coordination for ADLs.    Time  8    Period  Weeks    Status  New      OT LONG TERM GOAL #4   Title  Pt will complete clothing fasteners mod I using AE prn.    Time  8    Period  Weeks    Status  New      OT LONG TERM GOAL #5   Title  Assess environmental scanning and set goal as appropriate.    Time  8    Period  Weeks    Status  New      Long Term Additional Goals   Additional Long Term Goals  Yes      OT LONG TERM GOAL #6   Title  Pt will demo at least 75% gross finger flex/ext for grasp/release of objects.    Time  8    Period  Weeks    Status  New            Plan - 02/25/20 1219    Clinical Impression Statement  Pt appears to get frustrated easily and decreased attention/awareness into deficits. Pt easily frustrated if wife tried to explain further. Pt also needs reinforcement/review of recently issued HEP    Occupational performance deficits (Please refer to evaluation for details):  ADL's;IADL's;Leisure;Social Participation    Body Structure / Function / Physical Skills  ADL;Dexterity;ROM;IADL;Sensation;Mobility;Strength;FMC;Coordination;Decreased knowledge of precautions;Decreased knowledge of use of DME;Pain;UE functional use;GMC;Proprioception    Cognitive Skills  Attention;Memory;Perception;Safety Awareness    Rehab Potential  Good    OT Frequency  2x / week    OT Duration  8 weeks    OT Treatment/Interventions  Self-care/ADL training;Moist Heat;Fluidtherapy;DME and/or AE instruction;Splinting;Therapeutic activities;Contrast Bath;Aquatic Therapy;Ultrasound;Therapeutic exercise;Cognitive remediation/compensation;Visual/perceptual remediation/compensation;Passive range of  motion;Functional Mobility Training;Neuromuscular education;Cryotherapy;Electrical Stimulation;Paraffin;Energy conservation;Manual Therapy;Patient/family education    Plan  estim to wrist/finger extensors (cleared by MD), assess environmental scanning and update LTG #5, review HEP (Pt had difficulty deciphering b/t forearm sup and wrist ext in gravity elim plane)    Consulted and Agree with Plan of Care  Patient;Family member/caregiver    Family Member Consulted  wife       Patient will benefit from skilled therapeutic intervention in order to improve the following deficits and impairments:   Body Structure / Function / Physical Skills: ADL, Dexterity, ROM, IADL, Sensation, Mobility, Strength, FMC, Coordination, Decreased knowledge of precautions, Decreased knowledge of use of DME, Pain, UE functional use, GMC, Proprioception Cognitive Skills: Attention, Memory, Perception, Safety Awareness     Visit Diagnosis: Hemiplegia and hemiparesis following cerebral infarction affecting left non-dominant side (HCC)  Other disturbances of skin sensation  Attention and concentration deficit    Problem List Patient Active Problem List   Diagnosis Date Noted  . Chronic bilateral low back pain without sciatica 02/25/2020  . Spasticity as late effect of cerebrovascular accident (CVA) 02/25/2020  . Cognitive and neurobehavioral dysfunction   . Hypertensive urgency 01/28/2020  . Urinary retention 01/28/2020  . Right middle cerebral artery stroke ([place]) 01/28/2020  . Stroke (cerebrum) ([place]) -  [name] 01/25/2020  . Acute respiratory failure with hypoxia ([place])   . Hypotension   .  Hypokalemia   . Hypocalcemia   . Encephalopathy acute   . Stroke ([place]) 01/24/2020  . Middle cerebral artery embolism, right 01/24/2020  . Hyperlipidemia 02/21/2019  . Impingement syndrome of right shoulder region  02/21/2018  . OA (osteoarthritis) of hip 01/18/2018  . Prostate cancer ([place]) 03/04/2016  . Essential hypertension 11/27/2014  . History of colonic polyps 09/26/2014  . BACK PAIN, LEFT 12/22/2009  . PROSTATE SPECIFIC ANTIGEN, ELEVATED 12/20/2008  . LATERAL EPICONDYLITIS 07/15/2008  . ACTINIC KERATOSIS, FOREHEAD, LEFT 05/23/2008  . WART, LEFT HAND 07/19/2007  . ERECTILE DYSFUNCTION, MILD 07/19/2007  . HERNIA, BILATERAL INGUINAL W/O OBST/GANGRENE 07/19/2007  . MENISCUS TEAR 07/19/2007  . Bilateral inguinal hernia 07/19/2007    Carey Bullocks, OTR/L 02/25/2020, 12:24 PM  [facility] [address] [place], [place], [address] Phone: [phone]   Fax:  [phone]  Name: Ruben Chavez MRN: [id number] Date of Birth: 11/23/1951

## 2020-02-25 NOTE — Patient Instructions (Addendum)
Patient is a 69 yr old male with R MCA CVA s/p loop recorder with hx of prostate CA s/p 3 yr ago; HTN, HLD, low back pain and LUE edema here for f/u.    1. Take BP no more than 1x/day- unless feeling bad.   2. Can call [employer]- 1800- [employer]- to see about loop recorder. TO check into app with smart phone.   3. Used to think 1 year was hard and fast rule-but some patients have made gains up to 10 years later, with a lot of work involved.  But most gains are within first year.   4. Neurologist has to release him to drive.    5. Plavix- per Neurology. They usually do Plavix 3 months with Aspirin then go to ASA alone.   6. Can buy an estim unit- ask PT/OT where to put electrodes. OK to use estim with pt- prostate cancer >3 yrs ago.  7. Wait to start spasticity medications- educating about spasticity- can look at youtube and see videos of spasticity-   8. F/U in 4 weeks. To f/u on spasticity. Do ROM multiple times per day. Can get tramadol from PCP; if she refuses to prescribe, can write Rx.

## 2020-02-25 NOTE — Patient Instructions (Signed)
  Supination (Active)    With elbow held at right angle and kept at side, turn palm upward as far as possible. Hold _3___ seconds. Repeat _10___ times. Do __3__ sessions per day. Activity: Use this motion when turning cards over.*  WRIST: Extension No Gravity    Rest arm and hand on surface with thumb up. Move wrist away from body. _10__ reps per set, _3__ sets per day,  MP / PIP / DIP Composite Flexion (Passive Stretch)    Use other hand to bend ___all___ fingers at all three joints. Hold _10__ seconds. Repeat _5___ times. Do _3___ sessions per day.  **ALSO, REMEMBER TO STRETCH THUMB ACROSS PALM ("4") AND OUT TO SIDE ("5") AND IN FRONT OF PALM ("L")

## 2020-02-25 NOTE — Progress Notes (Signed)
Subjective:    Patient ID: Ruben Chavez, male    DOB: Mar 15, 1951, 69 y.o.   MRN: [id number]  HPI  Patient is a 69 yr old male with R MCA CVA s/p loop recorder with hx of prostate CA s/p 3 yr ago; HTN, HLD, low back pain and LUE edema here for f/u.     If back pain mild, uses tylenol- if worse,  Uses tramadol. Working well as a medication.   Back on Lisinopril/HCTZ- been 106/74 today and no higher than 130s/sytolic at home.   Scared because dad died at stroke at age 78 and smoked unfiltered cigarettes most of his life-   Has pain with flexion and extension today with OT and wrist is sore- which is new.   Outpatient OT and PT and SLP- goes 2x/week Does HEP- 2-3x/day-   Just got compression glove and wrist brace.    Didn't think has spasticity.    Pain Inventory Average Pain 4 Pain Right Now 0 My pain is aching  In the last 24 hours, has pain interfered with the following? General activity 0 Relation with others 0 Enjoyment of life 0 What TIME of day is your pain at its worst? varies Sleep (in general) Good  Pain is worse with: some activites Pain improves with: rest and medication Relief from Meds: 8  Mobility walk without assistance ability to climb steps?  yes do you drive?  no  Function retired I need assistance with the following:  feeding and dressing  Neuro/Psych No problems in this area  Prior Studies transitional  Physicians involved in your care transitional   Family History  Problem Relation Age of Onset  . Pancreatic cancer Mother   . Heart disease Father   . Colon cancer Neg Hx    Social History   Socioeconomic History  . Marital status: Married    Spouse name: Not on file  . Number of children: Not on file  . Years of education: Not on file  . Highest education level: Not on file  Occupational History  . Not on file  Tobacco Use  . Smoking status: Former Smoker    Packs/day: 1.00    Years: 32.00    Pack years: 32.00   Types: Cigarettes    Quit date: 06/06/1999    Years since quitting: 20.7  . Smokeless tobacco: Never Used  Substance and Sexual Activity  . Alcohol use: Yes    Alcohol/week: 14.0 standard drinks    Types: 14 Shots of liquor per week  . Drug use: No  . Sexual activity: Yes  Other Topics Concern  . Not on file  Social History Narrative  . Not on file   Social Determinants of Health   Financial Resource Strain:   . Difficulty of Paying Living Expenses:   Food Insecurity:   . Worried About Charity fundraiser in the Last Year:   . Arboriculturist in the Last Year:   Transportation Needs:   . Film/video editor (Medical):   Marland Kitchen Lack of Transportation (Non-Medical):   Physical Activity:   . Days of Exercise per Week:   . Minutes of Exercise per Session:   Stress:   . Feeling of Stress :   Social Connections:   . Frequency of Communication with Friends and Family:   . Frequency of Social Gatherings with Friends and Family:   . Attends Religious Services:   . Active Member of Clubs or Organizations:   .  Attends Archivist Meetings:   Marland Kitchen Marital Status:    Past Surgical History:  Procedure Laterality Date  . bilateral inguinal hernia  2008  . BUBBLE STUDY  01/28/2020   Procedure: BUBBLE STUDY;  Surgeon: [name], MD;  Location: [facility];  Service: Cardiovascular;;  . CYST REMOVAL TRUNK  2016   sebaceous cyst  . CYSTOSCOPY  04/22/2016   Procedure: CYSTOSCOPY;  Surgeon: [name], MD;  Location: [facility];  Service: Urology;;  . IR ANGIO VERTEBRAL SEL SUBCLAVIAN INNOMINATE UNI R MOD SED  01/25/2020  . IR CT HEAD LTD  01/25/2020  . IR PERCUTANEOUS ART THROMBECTOMY/INFUSION INTRACRANIAL INC DIAG ANGIO  01/25/2020  . KNEE ARTHROSCOPY  2007   right  . LOOP RECORDER INSERTION N/A 01/28/2020   Procedure: LOOP RECORDER INSERTION;  Surgeon: [name], MD;  Location: [place] [facility];  Service: Cardiovascular;  Laterality: N/A;  .  PROSTATE BIOPSY  2008  . PROSTATE BIOPSY  2013  . PROSTATE BIOPSY  01/28/2016  . RADIOACTIVE SEED IMPLANT N/A 04/22/2016   Procedure: RADIOACTIVE SEED IMPLANT/BRACHYTHERAPY IMPLANT;  Surgeon: [name], MD;  Location: [facility];  Service: Urology;  Laterality: N/A;  . RADIOLOGY WITH ANESTHESIA N/A 01/24/2020   Procedure: IR WITH ANESTHESIA;  Surgeon: [name], MD;  Location: [facility];  Service: Radiology;  Laterality: N/A;  . TEE WITHOUT CARDIOVERSION N/A 01/28/2020   Procedure: TRANSESOPHAGEAL ECHOCARDIOGRAM (TEE);  Surgeon: [name], MD;  Location[facility];  Service: Cardiovascular;  Laterality: N/A;  . TONSILLECTOMY    . TOTAL HIP ARTHROPLASTY  2001   left  . TOTAL HIP ARTHROPLASTY Right 01/18/2018   Procedure: RIGHT TOTAL HIP ARTHROPLASTY ANTERIOR APPROACH;  Surgeon: [name], MD;  [facility];  Service: Orthopedics;  Laterality: Right;   Past Medical History:  Diagnosis Date  . Elevated PSA   . Hyperlipidemia    pt says not anymore  . Hypertension   . Prostate cancer ([place]) 2013   Temp 97.7 F (36.5 C)   Ht 5\' 11"  (1.803 m)   Wt 173 lb (78.5 kg)   BMI 24.13 kg/m   Opioid Risk Score:   Fall Risk Score:  `1  Depression screen PHQ 2/9  Depression screen [facility] 2/9 10/09/2019 02/21/2019 08/21/2018 10/05/2016 05/04/2016 03/18/2016 03/15/2016  Decreased Interest 0 0 0 0 0 0 0  Down, Depressed, Hopeless 0 0 0 0 0 0 0  PHQ - 2 Score 0 0 0 0 0 0 0  Altered sleeping 0 - - - - - -  Tired, decreased energy 0 - - - - - -  Change in appetite 0 - - - - - -  Feeling bad or failure about yourself  0 - - - - - -  Trouble concentrating 0 - - - - - -  Moving slowly or fidgety/restless 0 - - - - - -  Suicidal thoughts 0 - - - - - -  PHQ-9 Score 0 - - - - - -  Difficult doing work/chores Not difficult at all - - - - - -    Review of Systems  Constitutional: Negative.   HENT: Negative.   Respiratory: Negative.   Cardiovascular: Negative.     Gastrointestinal: Negative.   Endocrine: Negative.   Genitourinary: Negative.   Musculoskeletal: Positive for arthralgias.  Skin: Negative.   Allergic/Immunologic: Negative.   Neurological: Negative.   Hematological: Negative.   Psychiatric/Behavioral: Negative.   All other  systems reviewed and are negative.      Objective:   Physical Exam Awake, alert, appropriate, sitting up on table, NAD Wearing L wrist brace and compression glove- swelling good.  (+) Hoffman's on L side; slightly increased tone in 3-4th digits- MAS of 1; otherwise no increased tone No clonus in LLE or increased tone in LLE  MS: LUE- biceps 4-/5, triceps 3+/5, no WE, grip or finger abduction LLE- HF 4-/5, KE 4-/5, KF 3+/5, DF 4/5, PF 5-/5  Trace edema in L hand Wearing compression glove and L wrist brace        Assessment & Plan:  Patient is a 69 yr old male with R MCA CVA s/p loop recorder with hx of prostate CA s/p 3 yr ago; HTN, HLD, low back pain and LUE edema here for f/u.    1. Take BP no more than 1x/day- unless feeling bad.   2. Can call [employer]- 1800- [employer]- to see about loop recorder. TO check into app with smart phone.   3. Used to think 1 year was hard and fast rule-but some patients have made gains up to 10 years later, with a lot of work involved.  But most gains are within first year.   4. Neurologist has to release him to drive.    5. Plavix- per Neurology. They usually do Plavix 3 months with Aspirin then go to [facility]SA alone.   6. Can buy an estim unit- ask PT/OT where to put electrodes. OK to use estim with pt- prostate cancer >3 yrs ago.  7. Wait to start spasticity medications- educating about spasticity- can look at youtube and see videos of spasticity-   8. F/U in 4 weeks. To f/u on spasticity.   I spent a total of 35 minutes on appointment more than 20 minutes educating on spasticity and therapy plans.

## 2020-02-27 ENCOUNTER — Other Ambulatory Visit: Payer: Self-pay

## 2020-02-27 NOTE — Progress Notes (Signed)
CARDIOLOGY OFFICE NOTE  Date:  03/05/2020    Ruben Chavez Date of Birth: Apr 25, 1951 Medical Record [id number]  PCP:  Ruben Chavez, Ruben Halsted, MD  Cardiologist:  Ruben Chavez (NEW)  Chief Complaint  Patient presents with  . Hospitalization Follow-up    Seen for Dr. Johnsie Chavez    History of Present Illness: Ruben Chavez is a 69 y.o. male who presents today for a post hospital visit. Seen for Dr. Johnsie Chavez.   He has had recent ILR implant looking for AF due to prior stroke last month. Presented last month with L sided weakness, hemisensory loss, facial droop, visual field cut, dysarthria and anosognosia. Taken to IR for R MCA occlusion.  S/p tPA and IR with TICI2b racanalizaiton.  He was monitored on telemetry which demonstrated sinus rhythm with no arrhythmias.   Echocardiogram showed an EF 60-65%.  Venous Doppler studies negative.  Neurology consulted maintain on aspirin and Plavix for CVA prophylaxis.  Subcutaneous Lovenox for DVT prophylaxis.  TEE showed ejection fraction 65% negative bubble study without thrombus.  Loop recorder placed 01/28/2020.  Patient did have occasional bouts of orthostatic hypotension blood pressure in the 60s on 01/26/2020 conservative care monitor out of bed. Was sent to rehab.   His other issues include HLD, HTN, prostate cancer with prior radioactive seed implant and remote tobacco abuse.   The patient does not have symptoms concerning for COVID-19 infection (fever, chills, cough, or new shortness of breath).   Comes in today. Here with his wife. He is doing well. Recovering from his stroke - still with limitation of the left hand - frustrating for him. No chest pain. Breathing is ok. Loop in place. No palpitations. He is worried that the transmitter is not working right - he thinks that the lights in the room have to be on - this is not correct. He is on statin.    Past Medical History:  Diagnosis Date  . Elevated PSA   . Hyperlipidemia    pt says  not anymore  . Hypertension   . Prostate cancer ([place]) 2013    Past Surgical History:  Procedure Laterality Date  . bilateral inguinal hernia  2008  . BUBBLE STUDY  01/28/2020   Procedure: BUBBLE STUDY;  Surgeon: [name], MD;  Location: [facility] ENDOSCOPY;  Service: Cardiovascular;;  . CYST REMOVAL TRUNK  2016   sebaceous cyst  . CYSTOSCOPY  04/22/2016   Procedure: CYSTOSCOPY;  Surgeon: [name], MD;  Location: [facility];  Service: Urology;;  . IR ANGIO VERTEBRAL SEL SUBCLAVIAN INNOMINATE UNI R MOD SED  01/25/2020  . IR CT HEAD LTD  01/25/2020  . IR PERCUTANEOUS ART THROMBECTOMY/INFUSION INTRACRANIAL INC DIAG ANGIO  01/25/2020  . KNEE ARTHROSCOPY  2007   right  . LOOP RECORDER INSERTION N/A 01/28/2020   Procedure: LOOP RECORDER INSERTION;  Surgeon: [name], MD;  Location: [place] CV LAB;  Service: Cardiovascular;  Laterality: N/A;  . PROSTATE BIOPSY  2008  . PROSTATE BIOPSY  2013  . PROSTATE BIOPSY  01/28/2016  . RADIOACTIVE SEED IMPLANT N/A 04/22/2016   Procedure: RADIOACTIVE SEED IMPLANT/BRACHYTHERAPY IMPLANT;  Surgeon: [name], MD;  Location[facility];  Service: Urology;  Laterality: N/A;  . RADIOLOGY WITH ANESTHESIA N/A 01/24/2020   Procedure: IR WITH ANESTHESIA;  Surgeon: [name], MD;  Location: [place];  Service: Radiology;  Laterality: N/A;  . TEE WITHOUT CARDIOVERSION N/A 01/28/2020   Procedure: TRANSESOPHAGEAL ECHOCARDIOGRAM (TEE);  Surgeon: [name], MD;  Location: [facility] ENDOSCOPY;  Service: Cardiovascular;  Laterality: N/A;  . TONSILLECTOMY    . TOTAL HIP ARTHROPLASTY  2001   left  . TOTAL HIP ARTHROPLASTY Right 01/18/2018   Procedure: RIGHT TOTAL HIP ARTHROPLASTY ANTERIOR APPROACH;  Surgeon: [name], MD;  Location: [facility] ORS;  Service: Orthopedics;  Laterality: Right;     Medications: Current Meds  Medication Sig  . acetaminophen (TYLENOL) 325 MG tablet Take 2 tablets (650 mg total) by mouth  every 4 (four) hours as needed for mild pain (or temp > 37.5 C (99.5 F)).  Marland Kitchen aspirin EC 325 MG EC tablet Take 1 tablet (325 mg total) by mouth daily.  Marland Kitchen atorvastatin (LIPITOR) 20 MG tablet Take 1 tablet (20 mg total) by mouth daily at 6 PM.  . buPROPion (WELLBUTRIN) 75 MG tablet Take 1 tablet (75 mg total) by mouth 2 (two) times daily at 10 am and 4 pm.  . clopidogrel (PLAVIX) 75 MG tablet Take 1 tablet (75 mg total) by mouth daily.  . folic acid (FOLVITE) 1 MG tablet Take 1 tablet (1 mg total) by mouth daily.  Marland Kitchen lisinopril-hydrochlorothiazide (ZESTORETIC) 20-25 MG tablet Take 1 tablet by mouth daily.  . Multiple Vitamin (MULTIVITAMIN WITH MINERALS) TABS tablet Take 1 tablet by mouth daily.  . pantoprazole (PROTONIX) 40 MG tablet Take 1 tablet (40 mg total) by mouth daily.  Marland Kitchen senna-docusate (SENOKOT-S) 8.6-50 MG tablet Take 2 tablets by mouth daily.  . traMADol (ULTRAM) 50 MG tablet Take 1 tablet (50 mg total) by mouth every 6 (six) hours as needed for moderate pain.  . traZODone (DESYREL) 100 MG tablet Take 1 tablet (100 mg total) by mouth at bedtime.     Allergies: Allergies  Allergen Reactions  . Sulfa Antibiotics Swelling and Other (See Comments)    Swelling in ankles   . Advil [Ibuprofen] Other (See Comments)    "Dots on chest" (Petechiae)  . Aleve [Naproxen] Other (See Comments)    "Dots on chest" (Petechiae)  . Betadine [Povidone Iodine] Itching and Rash  . Chloroxylenol (Antiseptic) Itching, Rash and Other (See Comments)    PCMX surgical sterilizing scrub  . Poison Ivy Extract Rash  . Povidone-Iodine Itching and Rash    BETADINE    Social History: The patient  reports that he quit smoking about 20 years ago. His smoking use included cigarettes. He has a 32.00 pack-year smoking history. He has never used smokeless tobacco. He reports current alcohol use of about 14.0 standard drinks of alcohol per week. He reports that he does not use drugs.   Family History: The patient's  family history includes Heart disease in his father; Pancreatic cancer in his mother.   Review of Systems: Please see the history of present illness.   All other systems are reviewed and negative.   Physical Exam: VS:  BP 110/70   Pulse 70   Ht 5\' 11"  (1.803 m)   Wt 172 lb 12.8 oz (78.4 kg)   SpO2 98%   BMI 24.10 kg/m  .  BMI Body mass index is 24.1 kg/m.  Wt Readings from Last 3 Encounters:  03/05/20 172 lb 12.8 oz (78.4 kg)  02/28/20 172 lb 3.2 oz (78.1 kg)  02/25/20 173 lb (78.5 kg)    General: Alert and in no acute distress.   Cardiac: Regular rate and rhythm. No murmurs, rubs, or gallops. No edema.  Respiratory:  Lungs are clear to auscultation bilaterally with normal work of  breathing.  GI: Soft and nontender.  MS: No deformity or atrophy. Gait and ROM intact.  Skin: Warm and dry. Color is normal.  Neuro:  Strength and sensation are intact and no gross focal deficits noted but with left hand deficit and in splint.  Psych: Alert, appropriate and with normal affect. Speech fairly clear today.    LABORATORY DATA:  EKG:  EKG is not ordered today.    Lab Results  Component Value Date   WBC 6.0 02/11/2020   HGB 10.7 (L) 02/11/2020   HCT 32.3 (L) 02/11/2020   PLT 471 (H) 02/11/2020   GLUCOSE 96 02/11/2020   CHOL 117 01/25/2020   TRIG 135 01/25/2020   HDL 36 (L) 01/25/2020   LDLCALC 54 01/25/2020   ALT 26 01/29/2020   AST 26 01/29/2020   NA 140 02/11/2020   K 3.8 02/11/2020   CL 108 02/11/2020   CREATININE 0.89 02/11/2020   BUN 16 02/11/2020   CO2 24 02/11/2020   TSH 1.44 10/09/2019   PSA 0.10 10/09/2019   INR 1.0 01/24/2020   HGBA1C 5.0 01/25/2020     BNP (last 3 results) No results for input(s): BNP in the last 8760 hours.  ProBNP (last 3 results) No results for input(s): PROBNP in the last 8760 hours.   Other Studies Reviewed Today:  ECHO IMPRESSIONS 01/2020  1. Left ventricular ejection fraction, by estimation, is 60 to 65%. The  left  ventricle has normal function. LV endocardial border not optimally  defined to evaluate regional wall motion. Left ventricular diastolic  parameters are consistent with Grade I  diastolic dysfunction (impaired relaxation).  2. Right ventricular systolic function is normal. The right ventricular  size is normal. Tricuspid regurgitation signal is inadequate for assessing  PA pressure.  3. The mitral valve is grossly normal. No evidence of mitral valve  regurgitation. No evidence of mitral stenosis.  4. The aortic valve is tricuspid. Aortic valve regurgitation is not  visualized. No aortic stenosis is present.   Assessment/Plan:  1. Cryptogenic stroke - has ILR in place to look for AF. He is getting therapy. Currently on DAPT for 3 months. Has neurology follow up in April. His device is working fine - this was checked by EP staff today for me - and he is assured that there is no need to leave a light on in his bedroom where the transmitter sits.   2. HTN - BP looks good  3. HLD - on statin therapy  4. COVID-19 Education: The signs and symptoms of COVID-19 were discussed with the patient and how to seek care for testing (follow up with PCP or arrange E-visit).  The importance of social distancing, staying at home, hand hygiene and wearing a mask when out in public were discussed today.  Current medicines are reviewed with the patient today.  The patient does not have concerns regarding medicines other than what has been noted above.  The following changes have been made:  See above.  Labs/ tests ordered today include:   No orders of the defined types were placed in this encounter.    Disposition:   EP to follow his ILR. Will be available as needed.   Patient is agreeable to this plan and will call if any problems develop in the interim.   SignedTruitt Merle, NP  03/05/2020 4:25 PM  [place] [address] [place], [place]   [address] Phone: [phone] Fax: [phone]

## 2020-02-28 ENCOUNTER — Ambulatory Visit: Payer: PPO | Admitting: Internal Medicine

## 2020-02-28 ENCOUNTER — Encounter: Payer: Self-pay | Admitting: Internal Medicine

## 2020-02-28 ENCOUNTER — Ambulatory Visit (INDEPENDENT_AMBULATORY_CARE_PROVIDER_SITE_OTHER): Payer: PPO | Admitting: Internal Medicine

## 2020-02-28 VITALS — BP 110/70 | HR 70 | Temp 97.2°F | Wt 172.2 lb

## 2020-02-28 DIAGNOSIS — I63411 Cerebral infarction due to embolism of right middle cerebral artery: Secondary | ICD-10-CM

## 2020-02-28 DIAGNOSIS — Z09 Encounter for follow-up examination after completed treatment for conditions other than malignant neoplasm: Secondary | ICD-10-CM

## 2020-02-28 NOTE — Patient Instructions (Signed)
-  Nice seeing you today!!  -See you back in 3 months. 

## 2020-02-28 NOTE — Progress Notes (Signed)
Established Patient Office Visit     This visit occurred during the SARS-CoV-2 public health emergency.  Safety protocols were in place, including screening questions prior to the visit, additional usage of staff PPE, and extensive cleaning of exam room while observing appropriate contact time as indicated for disinfecting solutions.    CC/Reason for Visit: Hospital follow-up  HPI: KAHLER WEHBE is a 69 y.o. male who is coming in today for the above mentioned reasons. Past Medical History is significant for: Well-controlled hypertension, hyperlipidemia, history of prostate cancer, history of right hip replacement in 2019, osteoarthritis.  He unfortunately suffered a right MCA CVA end of February and was hospitalized for this.  He did receive TPA and recanalization by IR.  He was seen by the stroke team.  He is currently on dual antiplatelet therapy with plans to continue for 3 months and then transition to aspirin alone.  He has an implanted loop recorder.  He has follow ups with neurology and cardiology upcoming.  After his hospital discharge he was admitted to CIR.  He is very frustrated with his continued slurred speech and left hand issues.  He is attending rehab sessions outpatient.  He has followed up with his PM and R doctor.  He wonders about his lisinopril/hydrochlorothiazide as it was not on his hospital discharge medications but he has been taking it regardless.  He is here with his wife today.   Past Medical/Surgical History: Past Medical History:  Diagnosis Date  . Elevated PSA   . Hyperlipidemia    pt says not anymore  . Hypertension   . Prostate cancer ([place]) 2013    Past Surgical History:  Procedure Laterality Date  . bilateral inguinal hernia  2008  . BUBBLE STUDY  01/28/2020   Procedure: BUBBLE STUDY;  Surgeon: [name], MD;  Location: [facility] ENDOSCOPY;  Service: Cardiovascular;;  . CYST REMOVAL TRUNK  2016   sebaceous cyst  . CYSTOSCOPY  04/22/2016   Procedure: CYSTOSCOPY;  Surgeon: [name], MD;  Location: [facility];  Service: Urology;;  . IR ANGIO VERTEBRAL SEL SUBCLAVIAN INNOMINATE UNI R MOD SED  01/25/2020  . IR CT HEAD LTD  01/25/2020  . IR PERCUTANEOUS ART THROMBECTOMY/INFUSION INTRACRANIAL INC DIAG ANGIO  01/25/2020  . KNEE ARTHROSCOPY  2007   right  . LOOP RECORDER INSERTION N/A 01/28/2020   Procedure: LOOP RECORDER INSERTION;  Surgeon: [name], MD;  Location: [place] CV LAB;  Service: Cardiovascular;  Laterality: N/A;  . PROSTATE BIOPSY  2008  . PROSTATE BIOPSY  2013  . PROSTATE BIOPSY  01/28/2016  . RADIOACTIVE SEED IMPLANT N/A 04/22/2016   Procedure: RADIOACTIVE SEED IMPLANT/BRACHYTHERAPY IMPLANT;  Surgeon: [name], MD;  Location: [facility];  Service: Urology;  Laterality: N/A;  . RADIOLOGY WITH ANESTHESIA N/A 01/24/2020   Procedure: IR WITH ANESTHESIA;  Surgeon: [name], MD;  Location: [place];  Service: Radiology;  Laterality: N/A;  . TEE WITHOUT CARDIOVERSION N/A 01/28/2020   Procedure: TRANSESOPHAGEAL ECHOCARDIOGRAM (TEE);  Surgeon: [name], MD;  Location: [facility] ENDOSCOPY;  Service: Cardiovascular;  Laterality: N/A;  . TONSILLECTOMY    . TOTAL HIP ARTHROPLASTY  2001   left  . TOTAL HIP ARTHROPLASTY Right 01/18/2018   Procedure: RIGHT TOTAL HIP ARTHROPLASTY ANTERIOR APPROACH;  Surgeon: [name], MD;  Location: [facility] ORS;  Service: Orthopedics;  Laterality: Right;    Social History:  reports that he quit smoking about 20 years ago. His smoking  use included cigarettes. He has a 32.00 pack-year smoking history. He has never used smokeless tobacco. He reports current alcohol use of about 14.0 standard drinks of alcohol per week. He reports that he does not use drugs.  Allergies: Allergies  Allergen Reactions  . Sulfa Antibiotics Swelling and Other (See Comments)    Swelling in ankles   . Advil [Ibuprofen] Other (See Comments)    "Dots on chest"  (Petechiae)  . Aleve [Naproxen] Other (See Comments)    "Dots on chest" (Petechiae)  . Betadine [Povidone Iodine] Itching and Rash  . Chloroxylenol (Antiseptic) Itching, Rash and Other (See Comments)    PCMX surgical sterilizing scrub  . Poison Ivy Extract Rash  . Povidone-Iodine Itching and Rash    BETADINE    Family History:  Family History  Problem Relation Age of Onset  . Pancreatic cancer Mother   . Heart disease Father   . Colon cancer Neg Hx      Current Outpatient Medications:  .  acetaminophen (TYLENOL) 325 MG tablet, Take 2 tablets (650 mg total) by mouth every 4 (four) hours as needed for mild pain (or temp > 37.5 C (99.5 F))., Disp:  , Rfl:  .  aspirin EC 325 MG EC tablet, Take 1 tablet (325 mg total) by mouth daily., Disp: 30 tablet, Rfl: 0 .  atorvastatin (LIPITOR) 20 MG tablet, Take 1 tablet (20 mg total) by mouth daily at 6 PM., Disp: 30 tablet, Rfl: 0 .  buPROPion (WELLBUTRIN) 75 MG tablet, Take 1 tablet (75 mg total) by mouth 2 (two) times daily at 10 am and 4 pm., Disp: 60 tablet, Rfl: 0 .  clopidogrel (PLAVIX) 75 MG tablet, Take 1 tablet (75 mg total) by mouth daily., Disp: 30 tablet, Rfl: 1 .  folic acid (FOLVITE) 1 MG tablet, Take 1 tablet (1 mg total) by mouth daily., Disp: 30 tablet, Rfl: 0 .  lisinopril-hydrochlorothiazide (ZESTORETIC) 20-25 MG tablet, Take 1 tablet by mouth daily., Disp: , Rfl:  .  Multiple Vitamin (MULTIVITAMIN WITH MINERALS) TABS tablet, Take 1 tablet by mouth daily., Disp:  , Rfl:  .  pantoprazole (PROTONIX) 40 MG tablet, Take 1 tablet (40 mg total) by mouth daily., Disp: 30 tablet, Rfl: 0 .  senna-docusate (SENOKOT-S) 8.6-50 MG tablet, Take 2 tablets by mouth daily., Disp:  , Rfl:  .  traMADol (ULTRAM) 50 MG tablet, Take 1 tablet (50 mg total) by mouth every 6 (six) hours as needed for moderate pain., Disp: 30 tablet, Rfl: 0 .  traZODone (DESYREL) 100 MG tablet, Take 1 tablet (100 mg total) by mouth at bedtime., Disp: 30 tablet, Rfl:  0  Review of Systems:  Constitutional: Denies fever, chills, diaphoresis, appetite change and fatigue.  HEENT: Denies photophobia, eye pain, redness, hearing loss, ear pain, congestion, sore throat, rhinorrhea, sneezing, mouth sores, trouble swallowing, neck pain, neck stiffness and tinnitus.   Respiratory: Denies SOB, DOE, cough, chest tightness,  and wheezing.   Cardiovascular: Denies chest pain, palpitations and leg swelling.  Gastrointestinal: Denies nausea, vomiting, abdominal pain, diarrhea, constipation, blood in stool and abdominal distention.  Genitourinary: Denies dysuria, urgency, frequency, hematuria, flank pain and difficulty urinating.  Endocrine: Denies: hot or cold intolerance, sweats, changes in hair or nails, polyuria, polydipsia. Musculoskeletal: Denies myalgias, back pain, joint swelling, arthralgias and gait problem.  Skin: Denies pallor, rash and wound.  Neurological: Denies dizziness, seizures, syncope,  light-headedness  and headaches.  Hematological: Denies adenopathy. Easy bruising, personal or family bleeding history  Psychiatric/Behavioral:  Denies suicidal ideation, mood changes, confusion, nervousness, sleep disturbance and agitation    Physical Exam: Vitals:   02/28/20 1332  BP: 110/70  Pulse: 70  Temp: (!) 97.2 F (36.2 C)  TempSrc: Temporal  SpO2: 99%  Weight: 172 lb 3.2 oz (78.1 kg)    Body mass index is 24.02 kg/m.   Constitutional: NAD, calm, comfortable Eyes: PERRL, lids and conjunctivae normal ENMT: Mucous membranes are moist. Neck: normal, supple, no masses, no thyromegaly Respiratory: clear to auscultation bilaterally, no wheezing, no crackles. Normal respiratory effort. No accessory muscle use.  Cardiovascular: Regular rate and rhythm, no murmurs / rubs / gallops. No extremity edema.  No carotid bruits.  Psychiatric: Normal judgment and insight. Alert and oriented x 3. Normal mood.    Impression and Plan:  Hospital discharge  follow-up Cerebrovascular accident (CVA) due to embolism of right middle cerebral artery [facility])  -Hospital charts have been reviewed in detail. -Plan for dual antiplatelet therapy for 3 months and then aspirin alone unless loop recorder were to demonstrate A. fib in which case he would benefit from full anticoagulation. -He has follow-ups with neurology and cardiology upcoming. -Okay to continue blood pressure medication. -Plan to continue outpatient rehab sessions, particularly OT. -He is also following with physical medicine MD.    Patient Instructions  -Nice seeing you today!!  -See you back in 3 months!     Lelon Frohlich, MD [facility]

## 2020-02-29 ENCOUNTER — Telehealth: Payer: Self-pay | Admitting: Cardiovascular Disease

## 2020-02-29 NOTE — Telephone Encounter (Signed)
Nw Message:     Pt says he have an appt with Dr Johnsie Cancel on Wednesday(03-05-20). He says he needs his wife to come in with him. He says she is an Ex Therapist, sports and she understand better what the doctor is saying and needs toi ask questions.

## 2020-02-29 NOTE — Telephone Encounter (Signed)
Patient wants his wife to come up with him to his visit with Truitt Merle NP next week. Informed patient that his message would be sent to the provider and her nurse.

## 2020-03-02 NOTE — Telephone Encounter (Signed)
ok 

## 2020-03-03 ENCOUNTER — Encounter: Payer: Self-pay | Admitting: Occupational Therapy

## 2020-03-03 ENCOUNTER — Ambulatory Visit: Payer: PPO | Admitting: Occupational Therapy

## 2020-03-03 ENCOUNTER — Telehealth: Payer: Self-pay

## 2020-03-03 ENCOUNTER — Other Ambulatory Visit: Payer: Self-pay

## 2020-03-03 ENCOUNTER — Ambulatory Visit (INDEPENDENT_AMBULATORY_CARE_PROVIDER_SITE_OTHER): Payer: PPO | Admitting: *Deleted

## 2020-03-03 DIAGNOSIS — I69318 Other symptoms and signs involving cognitive functions following cerebral infarction: Secondary | ICD-10-CM

## 2020-03-03 DIAGNOSIS — R2681 Unsteadiness on feet: Secondary | ICD-10-CM

## 2020-03-03 DIAGNOSIS — R41840 Attention and concentration deficit: Secondary | ICD-10-CM

## 2020-03-03 DIAGNOSIS — R208 Other disturbances of skin sensation: Secondary | ICD-10-CM

## 2020-03-03 DIAGNOSIS — R414 Neurologic neglect syndrome: Secondary | ICD-10-CM

## 2020-03-03 DIAGNOSIS — I63411 Cerebral infarction due to embolism of right middle cerebral artery: Secondary | ICD-10-CM | POA: Diagnosis not present

## 2020-03-03 DIAGNOSIS — I69354 Hemiplegia and hemiparesis following cerebral infarction affecting left non-dominant side: Secondary | ICD-10-CM

## 2020-03-03 DIAGNOSIS — R278 Other lack of coordination: Secondary | ICD-10-CM

## 2020-03-03 LAB — CUP PACEART REMOTE DEVICE CHECK
Date Time Interrogation Session: 20210328091751
Implantable Pulse Generator Implant Date: 20210222

## 2020-03-03 NOTE — Telephone Encounter (Signed)
Patient called stating that he was taking an OTC supplement for Heart Health.

## 2020-03-03 NOTE — Telephone Encounter (Signed)
Sounds fine- thanks for letting me know- ML

## 2020-03-03 NOTE — Progress Notes (Signed)
ILR Remote 

## 2020-03-03 NOTE — Therapy (Signed)
[facility] [address] [place], [place], [address] Phone: [phone]   Fax:  [phone]  Occupational Therapy Treatment  Patient Details  Name: Ruben Chavez MRN: [id number] Date of Birth: [birth year] Referring Provider (OT): Lauraine Rinne, PA-C   Encounter Date: 03/03/2020  OT End of Session - 03/03/20 1327    Visit Number  3    Number of Visits  17    Date for OT Re-Evaluation  04/22/20    Authorization Type  [employer] (follow Medicare)--1 copay for multiple visits    Authorization Time Period  cert date 2/42/35-3/61/44    Authorization - Visit Number  3    Authorization - Number of Visits  10    Progress Note Due on Visit  10    OT Start Time  1233    OT Stop Time  1315    OT Time Calculation (min)  42 min    Activity Tolerance  Patient tolerated treatment well    Behavior During Therapy  [facility] for tasks assessed/performed       Past Medical History:  Diagnosis Date  . Elevated PSA   . Hyperlipidemia    pt says not anymore  . Hypertension   . Prostate cancer ([place]) 2013    Past Surgical History:  Procedure Laterality Date  . bilateral inguinal hernia  2008  . BUBBLE STUDY  01/28/2020   Procedure: BUBBLE STUDY;  Surgeon: [name], MD;  Location: [facility] ENDOSCOPY;  Service: Cardiovascular;;  . CYST REMOVAL TRUNK  2016   sebaceous cyst  . CYSTOSCOPY  04/22/2016   Procedure: CYSTOSCOPY;  Surgeon: [name], MD;  Location: [facility];  Service: Urology;;  . IR ANGIO VERTEBRAL SEL SUBCLAVIAN INNOMINATE UNI R MOD SED  01/25/2020  . IR CT HEAD LTD  01/25/2020  . IR PERCUTANEOUS ART THROMBECTOMY/INFUSION INTRACRANIAL INC DIAG ANGIO  01/25/2020  . KNEE ARTHROSCOPY  2007   right  . LOOP RECORDER INSERTION N/A 01/28/2020   Procedure: LOOP RECORDER INSERTION;  Surgeon: [name], MD;  Location: [facility] CV LAB;  Service: Cardiovascular;  Laterality: N/A;  . PROSTATE BIOPSY   2008  . PROSTATE BIOPSY  2013  . PROSTATE BIOPSY  01/28/2016  . RADIOACTIVE SEED IMPLANT N/A 04/22/2016   Procedure: RADIOACTIVE SEED IMPLANT/BRACHYTHERAPY IMPLANT;  Surgeon: [name], MD;  Location: [facility];  Service: Urology;  Laterality: N/A;  . RADIOLOGY WITH ANESTHESIA N/A 01/24/2020   Procedure: IR WITH ANESTHESIA;  Surgeon: [name], MD;  Location: [facility];  Service: Radiology;  Laterality: N/A;  . TEE WITHOUT CARDIOVERSION N/A 01/28/2020   Procedure: TRANSESOPHAGEAL ECHOCARDIOGRAM (TEE);  Surgeon: [name], MD;  Location: [facility] ENDOSCOPY;  Service: Cardiovascular;  Laterality: N/A;  . TONSILLECTOMY    . TOTAL HIP ARTHROPLASTY  2001   left  . TOTAL HIP ARTHROPLASTY Right 01/18/2018   Procedure: RIGHT TOTAL HIP ARTHROPLASTY ANTERIOR APPROACH;  Surgeon: [name], MD;  Location: [facility];  Service: Orthopedics;  Laterality: Right;    There were no vitals filed for this visit.  Subjective Assessment - 03/03/20 1238    Subjective   Pain 8/10 after removing splint at night    Currently in Pain?  No/denies    Pain Score  0-No pain                   OT Treatments/Exercises (OP) - 03/03/20 0001      Neurological Re-education Exercises  Other Exercises 1  Neuromuscular reeducation to address isolated wrist and forearm motion.  Patient needed overt cueing to avoid overuse of trunk, shoulder elbow musculature in every attempt to rotate forearm.  Patient responded well to tapping on wrist extensors to activate extension.  Patient initiating wrist extension with radial directed pull - may be leading to wrist malalignment.        Corporate treasurer Parameters  Unable to elicit wrist extension with estim  this session    Electrical Stimulation Goals  Neuromuscular facilitation      Manual Therapy    Manual Therapy  Joint mobilization    Manual therapy comments  Gentle carpal mobilization Patient with elevated surface mid carpal- distal row.  Patient reports pain at night when he removes brace.               OT Education - 03/03/20 1327    Education Details  Alignment of body to activate forearm/wrist    Person(s) Educated  Patient;Spouse    Methods  Explanation;Demonstration;Tactile cues;Verbal cues    Comprehension  Need further instruction       OT Short Term Goals - 02/25/20 1218      OT SHORT TERM GOAL #1   Title  Pt will be independent with initial HEP--check STG 03/24/20    Time  4    Period  Weeks    Status  On-going      OT SHORT TERM GOAL #2   Title  Pt will verbalize understanding of proper positioning of LUE to prevent pain/injury and edema management techniques.    Time  4    Period  Weeks    Status  On-going      OT SHORT TERM GOAL #3   Title  Pt will be independent with splint wear/care for improved positioning.    Time  4    Period  Weeks    Status  On-going      OT SHORT TERM GOAL #4   Title  Pt will be able to use LUE as a stabilizer for simple ADL tasks.    Time  4    Period  Weeks    Status  New      OT SHORT TERM GOAL #5   Title  Pt will demo at least 25% gross finger flex in prep for functional grasp.    Baseline  4    Time  4    Period  Weeks    Status  New        OT Long Term Goals - 02/22/20 1228      OT LONG TERM GOAL #1   Title  Pt will be independent with updated HEP.--check LTGs 04/23/20    Time  8    Period  Weeks    Status  New      OT LONG TERM GOAL #2   Title  Pt will be able to use LUE as nondominant assist for ADLs at least 50% of the time.    Time  8    Period  Weeks    Status  New      OT LONG TERM GOAL #3   Title  Pt will be able to score at least 10 on box and blocks test with LUE for improved coordination for ADLs.    Time  8  Period  Weeks    Status  New      OT LONG TERM GOAL #4   Title  Pt will  complete clothing fasteners mod I using AE prn.    Time  8    Period  Weeks    Status  New      OT LONG TERM GOAL #5   Title  Assess environmental scanning and set goal as appropriate.    Time  8    Period  Weeks    Status  New      Long Term Additional Goals   Additional Long Term Goals  Yes      OT LONG TERM GOAL #6   Title  Pt will demo at least 75% gross finger flex/ext for grasp/release of objects.    Time  8    Period  Weeks    Status  New            Plan - 03/03/20 1328    Clinical Impression Statement  Patient with significant wrist discomfort, and maladaptive patterns of motion in distal LUE.    OT Frequency  2x / week    OT Duration  8 weeks    OT Treatment/Interventions  Self-care/ADL training;Moist Heat;Fluidtherapy;DME and/or AE instruction;Splinting;Therapeutic activities;Contrast Bath;Aquatic Therapy;Ultrasound;Therapeutic exercise;Cognitive remediation/compensation;Visual/perceptual remediation/compensation;Passive range of motion;Functional Mobility Training;Neuromuscular education;Cryotherapy;Electrical Stimulation;Paraffin;Energy conservation;Manual Therapy;Patient/family education    Plan  estim to wrist/finger extensors (cleared by MD), assess environmental scanning and update LTG #5, review HEP (Pt had difficulty deciphering b/t forearm sup and wrist ext in gravity elim plane)       Patient will benefit from skilled therapeutic intervention in order to improve the following deficits and impairments:           Visit Diagnosis: Hemiplegia and hemiparesis following cerebral infarction affecting left non-dominant side (HCC)  Other disturbances of skin sensation  Attention and concentration deficit  Other lack of coordination  Unsteadiness on feet  Other symptoms and signs involving cognitive functions following cerebral infarction  Neurologic neglect syndrome    Problem List Patient Active Problem List   Diagnosis Date Noted  . Chronic  bilateral low back pain without sciatica 02/25/2020  . Spasticity as late effect of cerebrovascular accident (CVA) 02/25/2020  . Cognitive and neurobehavioral dysfunction   . Hypertensive urgency 01/28/2020  . Urinary retention 01/28/2020  . Right middle cerebral artery stroke ([place]) 01/28/2020  . Stroke (cerebrum) ([place]) -  [name] 01/25/2020  . Acute respiratory failure with hypoxia ([place])   . Hypotension   . Hypokalemia   . Hypocalcemia   . Encephalopathy acute   . Stroke ([place]) 01/24/2020  . Middle cerebral artery embolism, right 01/24/2020  . Hyperlipidemia 02/21/2019  . Impingement syndrome of right shoulder region 02/21/2018  . OA (osteoarthritis) of hip 01/18/2018  . Prostate cancer ([place]) 03/04/2016  . Essential hypertension 11/27/2014  . History of colonic polyps 09/26/2014  . BACK PAIN, LEFT 12/22/2009  . PROSTATE SPECIFIC ANTIGEN, ELEVATED 12/20/2008  . LATERAL EPICONDYLITIS 07/15/2008  . ACTINIC KERATOSIS, FOREHEAD, LEFT 05/23/2008  . WART, LEFT HAND 07/19/2007  . ERECTILE DYSFUNCTION, MILD 07/19/2007  . HERNIA, BILATERAL INGUINAL W/O OBST/GANGRENE 07/19/2007  . MENISCUS TEAR 07/19/2007  . Bilateral inguinal hernia 07/19/2007    Mariah Milling, OTR/L 03/03/2020, 1:30 PM  [facility] [address] [place], [place], [address] Phone: [phone]  Fax:  [phone]  Name: ZYRION COEY MRN: [id number] Date of Birth: 03/26/1951

## 2020-03-05 ENCOUNTER — Ambulatory Visit: Payer: PPO | Admitting: Nurse Practitioner

## 2020-03-05 ENCOUNTER — Other Ambulatory Visit: Payer: Self-pay

## 2020-03-05 ENCOUNTER — Encounter: Payer: Self-pay | Admitting: Nurse Practitioner

## 2020-03-05 VITALS — BP 110/70 | HR 70 | Ht 71.0 in | Wt 172.8 lb

## 2020-03-05 DIAGNOSIS — Z95818 Presence of other cardiac implants and grafts: Secondary | ICD-10-CM | POA: Diagnosis not present

## 2020-03-05 DIAGNOSIS — I63411 Cerebral infarction due to embolism of right middle cerebral artery: Secondary | ICD-10-CM | POA: Diagnosis not present

## 2020-03-05 NOTE — Patient Instructions (Addendum)
After Visit Summary:  We will be checking the following labs today - NONE   Medication Instructions:    Continue with your current medicines.    If you need a refill on your cardiac medications before your next appointment, please call your pharmacy.     Testing/Procedures To Be Arranged:  N/A  Follow-Up:   We will be following your loop recorder remotely and will be in contact with you if any issues arise.     At [facility], you and your health needs are our priority.  As part of our continuing mission to provide you with exceptional heart care, we have created designated Provider Care Teams.  These Care Teams include your primary Cardiologist (physician) and Advanced Practice Providers (APPs -  Physician Assistants and Nurse Practitioners) who all work together to provide you with the care you need, when you need it.  Special Instructions:  . Stay safe, stay home, wash your hands for at least 20 seconds and wear a mask when out in public.  . It was good to talk with you today.    Call the [facility] office at [phone] if you have any questions, problems or concerns.

## 2020-03-06 ENCOUNTER — Telehealth: Payer: Self-pay | Admitting: *Deleted

## 2020-03-06 NOTE — Telephone Encounter (Signed)
Patient called after hours line. Patient reports prior his stroke he was taking 300 mg over the counter Krill oil. He would like to know if he can still take that

## 2020-03-06 NOTE — Telephone Encounter (Signed)
Yes, this is fine.

## 2020-03-06 NOTE — Telephone Encounter (Signed)
Left message on machine for patient returning his call. 

## 2020-03-06 NOTE — Telephone Encounter (Signed)
Patient is aware 

## 2020-03-10 ENCOUNTER — Other Ambulatory Visit: Payer: Self-pay | Admitting: Internal Medicine

## 2020-03-10 ENCOUNTER — Telehealth: Payer: Self-pay | Admitting: Nurse Practitioner

## 2020-03-10 NOTE — Telephone Encounter (Signed)
New Message   Patient is calling because he wants to know if he can use a TENS unit in addition to him having a loop recorder. He states he needs an answer today because he has an appt tomorrow.  Please call to discuss.

## 2020-03-10 NOTE — Telephone Encounter (Signed)
Pt returned phone call.  He will be uising a tens unit on his lower right arm.  Advised that there is no restriction for TENS units and ILR.  WE may noticed the noise on transmission and contact him if that occurs.

## 2020-03-10 NOTE — Telephone Encounter (Signed)
Attempted to call pt back, no answer.  VM not accepting messages.  Attempted to call twice.

## 2020-03-10 NOTE — Telephone Encounter (Signed)
Patched call through to nurse.

## 2020-03-11 ENCOUNTER — Ambulatory Visit: Payer: PPO

## 2020-03-11 ENCOUNTER — Other Ambulatory Visit: Payer: Self-pay

## 2020-03-11 ENCOUNTER — Ambulatory Visit: Payer: PPO | Attending: Physician Assistant | Admitting: Occupational Therapy

## 2020-03-11 DIAGNOSIS — R41840 Attention and concentration deficit: Secondary | ICD-10-CM | POA: Insufficient documentation

## 2020-03-11 DIAGNOSIS — I69354 Hemiplegia and hemiparesis following cerebral infarction affecting left non-dominant side: Secondary | ICD-10-CM

## 2020-03-11 DIAGNOSIS — R414 Neurologic neglect syndrome: Secondary | ICD-10-CM | POA: Insufficient documentation

## 2020-03-11 DIAGNOSIS — R208 Other disturbances of skin sensation: Secondary | ICD-10-CM

## 2020-03-11 DIAGNOSIS — R2689 Other abnormalities of gait and mobility: Secondary | ICD-10-CM | POA: Insufficient documentation

## 2020-03-11 DIAGNOSIS — I69318 Other symptoms and signs involving cognitive functions following cerebral infarction: Secondary | ICD-10-CM | POA: Diagnosis not present

## 2020-03-11 DIAGNOSIS — R41841 Cognitive communication deficit: Secondary | ICD-10-CM | POA: Insufficient documentation

## 2020-03-11 DIAGNOSIS — R278 Other lack of coordination: Secondary | ICD-10-CM | POA: Diagnosis not present

## 2020-03-11 DIAGNOSIS — R2681 Unsteadiness on feet: Secondary | ICD-10-CM | POA: Insufficient documentation

## 2020-03-11 DIAGNOSIS — R471 Dysarthria and anarthria: Secondary | ICD-10-CM | POA: Diagnosis not present

## 2020-03-11 DIAGNOSIS — R29818 Other symptoms and signs involving the nervous system: Secondary | ICD-10-CM | POA: Insufficient documentation

## 2020-03-11 DIAGNOSIS — M6281 Muscle weakness (generalized): Secondary | ICD-10-CM | POA: Diagnosis not present

## 2020-03-11 DIAGNOSIS — R209 Unspecified disturbances of skin sensation: Secondary | ICD-10-CM | POA: Diagnosis not present

## 2020-03-11 NOTE — Therapy (Signed)
[facility] [address] [place], [place], [address] Phone: [phone]   Fax:  [phone]  Occupational Therapy Treatment  Patient Details  Name: Ruben Chavez MRN: [id number] Date of Birth: September 24, 1951 Referring Provider (OT): Lauraine Rinne, PA-C   Encounter Date: 03/11/2020  OT End of Session - 03/11/20 1436    Visit Number  4    Number of Visits  17    Date for OT Re-Evaluation  04/22/20    Authorization Type  [employer] (follow Medicare)--1 copay for multiple visits    Authorization Time Period  cert date 8/36/62-9/47/65    Authorization - Visit Number  4    Authorization - Number of Visits  10    Progress Note Due on Visit  10    OT Start Time  1230    OT Stop Time  1315    OT Time Calculation (min)  45 min    Activity Tolerance  Patient tolerated treatment well       Past Medical History:  Diagnosis Date  . Elevated PSA   . Hyperlipidemia    pt says not anymore  . Hypertension   . Prostate cancer ([place]) 2013    Past Surgical History:  Procedure Laterality Date  . bilateral inguinal hernia  2008  . BUBBLE STUDY  01/28/2020   Procedure: BUBBLE STUDY;  Surgeon: [name], MD;  Location: [facility] ENDOSCOPY;  Service: Cardiovascular;;  . CYST REMOVAL TRUNK  2016   sebaceous cyst  . CYSTOSCOPY  04/22/2016   Procedure: CYSTOSCOPY;  Surgeon: [name], MD;  Location: [facility];  Service: Urology;;  . IR ANGIO VERTEBRAL SEL SUBCLAVIAN INNOMINATE UNI R MOD SED  01/25/2020  . IR CT HEAD LTD  01/25/2020  . IR PERCUTANEOUS ART THROMBECTOMY/INFUSION INTRACRANIAL INC DIAG ANGIO  01/25/2020  . KNEE ARTHROSCOPY  2007   right  . LOOP RECORDER INSERTION N/A 01/28/2020   Procedure: LOOP RECORDER INSERTION;  Surgeon: [name], MD;  Location: [facility] CV LAB;  Service: Cardiovascular;  Laterality: N/A;  . PROSTATE BIOPSY  2008  . PROSTATE BIOPSY  2013  . PROSTATE BIOPSY  01/28/2016   . RADIOACTIVE SEED IMPLANT N/A 04/22/2016   Procedure: RADIOACTIVE SEED IMPLANT/BRACHYTHERAPY IMPLANT;  Surgeon: [name], MD;  Location: [facility];  Service: Urology;  Laterality: N/A;  . RADIOLOGY WITH ANESTHESIA N/A 01/24/2020   Procedure: IR WITH ANESTHESIA;  Surgeon: [name], MD;  Location: [facility];  Service: Radiology;  Laterality: N/A;  . TEE WITHOUT CARDIOVERSION N/A 01/28/2020   Procedure: TRANSESOPHAGEAL ECHOCARDIOGRAM (TEE);  Surgeon: [name], MD;  Location: [facility] ENDOSCOPY;  Service: Cardiovascular;  Laterality: N/A;  . TONSILLECTOMY    . TOTAL HIP ARTHROPLASTY  2001   left  . TOTAL HIP ARTHROPLASTY Right 01/18/2018   Procedure: RIGHT TOTAL HIP ARTHROPLASTY ANTERIOR APPROACH;  Surgeon: [name], MD;  Location: [facility];  Service: Orthopedics;  Laterality: Right;    There were no vitals filed for this visit.  Subjective Assessment - 03/11/20 1245    Patient is accompanied by:  Family member   wife   Pertinent History  PMH:  hyperlipidemia, hypertension, prostate cancer radioactive seed implant 2017, R total hip arthroplasty, loop recorder insertion    Limitations  loop recorder    Patient Stated Goals  get my L hand and fingers working    Currently in Pain?  No/denies       Pt had purchased and  brought in TENS device - therapist explained difference b/t TENS and NMES and why an NMES device would be needed to illicit a contraction. This particular TENS device did give ability to change pulse rate and width parameters but would be constant causing extreme muscle fatigue. Provided handouts on 2 other recommended devices.  Attempted NMES w/ clinic device however unable to illicit a contraction due to only having smaller electrodes (2" electrodes have not come in).  Reviewed proper positioning w/ sleeping and reaching patterns as pt has poor awareness into proper technique and appears to have impaired memory within session. Recommended pt  wear wrist brace at night until resting hand splint can be made secondary to wife reporting wrist flopping off side of bed. Also reviewed proper reaching technique with focus on controlled movement, forearm neutral (vs. Pronated) and preventing sh going into abduction and elbow flexion.                       OT Short Term Goals - 02/25/20 1218      OT SHORT TERM GOAL #1   Title  Pt will be independent with initial HEP--check STG 03/24/20    Time  4    Period  Weeks    Status  On-going      OT SHORT TERM GOAL #2   Title  Pt will verbalize understanding of proper positioning of LUE to prevent pain/injury and edema management techniques.    Time  4    Period  Weeks    Status  On-going      OT SHORT TERM GOAL #3   Title  Pt will be independent with splint wear/care for improved positioning.    Time  4    Period  Weeks    Status  On-going      OT SHORT TERM GOAL #4   Title  Pt will be able to use LUE as a stabilizer for simple ADL tasks.    Time  4    Period  Weeks    Status  New      OT SHORT TERM GOAL #5   Title  Pt will demo at least 25% gross finger flex in prep for functional grasp.    Baseline  4    Time  4    Period  Weeks    Status  New        OT Long Term Goals - 02/22/20 1228      OT LONG TERM GOAL #1   Title  Pt will be independent with updated HEP.--check LTGs 04/23/20    Time  8    Period  Weeks    Status  New      OT LONG TERM GOAL #2   Title  Pt will be able to use LUE as nondominant assist for ADLs at least 50% of the time.    Time  8    Period  Weeks    Status  New      OT LONG TERM GOAL #3   Title  Pt will be able to score at least 10 on box and blocks test with LUE for improved coordination for ADLs.    Time  8    Period  Weeks    Status  New      OT LONG TERM GOAL #4   Title  Pt will complete clothing fasteners mod I using AE prn.    Time  8    Period  Weeks    Status  New      OT LONG TERM GOAL #5   Title  Assess  environmental scanning and set goal as appropriate.    Time  8    Period  Weeks    Status  New      Long Term Additional Goals   Additional Long Term Goals  Yes      OT LONG TERM GOAL #6   Title  Pt will demo at least 75% gross finger flex/ext for grasp/release of objects.    Time  8    Period  Weeks    Status  New            Plan - 03/11/20 1436    Clinical Impression Statement  Patient with significant wrist discomfort, and maladaptive patterns of motion in distal LUE. Pt also appears to have memory deficits.    Occupational performance deficits (Please refer to evaluation for details):  ADL's;IADL's;Leisure;Social Participation    Body Structure / Function / Physical Skills  ADL;Dexterity;ROM;IADL;Sensation;Mobility;Strength;FMC;Coordination;Decreased knowledge of precautions;Decreased knowledge of use of DME;Pain;UE functional use;GMC;Proprioception    Cognitive Skills  Attention;Memory;Perception;Safety Awareness    Rehab Potential  Fair    OT Frequency  2x / week    OT Duration  8 weeks    OT Treatment/Interventions  Self-care/ADL training;Moist Heat;Fluidtherapy;DME and/or AE instruction;Splinting;Therapeutic activities;Contrast Bath;Aquatic Therapy;Ultrasound;Therapeutic exercise;Cognitive remediation/compensation;Visual/perceptual remediation/compensation;Passive range of motion;Functional Mobility Training;Neuromuscular education;Cryotherapy;Electrical Stimulation;Paraffin;Energy conservation;Manual Therapy;Patient/family education    Plan  Pt may bring in 2" electrodes to try estim again, continue NMR LUE, assess environmental scanning and update LTG #5    Consulted and Agree with Plan of Care  Patient;Family member/caregiver    Family Member Consulted  wife       Patient will benefit from skilled therapeutic intervention in order to improve the following deficits and impairments:   Body Structure / Function / Physical Skills: ADL, Dexterity, ROM, IADL, Sensation,  Mobility, Strength, FMC, Coordination, Decreased knowledge of precautions, Decreased knowledge of use of DME, Pain, UE functional use, GMC, Proprioception Cognitive Skills: Attention, Memory, Perception, Safety Awareness     Visit Diagnosis: Hemiplegia and hemiparesis following cerebral infarction affecting left non-dominant side (HCC)  Other disturbances of skin sensation  Attention and concentration deficit    Problem List Patient Active Problem List   Diagnosis Date Noted  . Chronic bilateral low back pain without sciatica 02/25/2020  . Spasticity as late effect of cerebrovascular accident (CVA) 02/25/2020  . Cognitive and neurobehavioral dysfunction   . Hypertensive urgency 01/28/2020  . Urinary retention 01/28/2020  . Right middle cerebral artery stroke ([place]) 01/28/2020  . Stroke (cerebrum) ([place]) -  [name] 01/25/2020  . Acute respiratory failure with hypoxia ([place])   . Hypotension   . Hypokalemia   . Hypocalcemia   . Encephalopathy acute   . Stroke ([place]) 01/24/2020  . Middle cerebral artery embolism, right 01/24/2020  . Hyperlipidemia 02/21/2019  . Impingement syndrome of right shoulder region 02/21/2018  . OA (osteoarthritis) of hip 01/18/2018  . Prostate cancer ([place]) 03/04/2016  . Essential hypertension 11/27/2014  . History of colonic polyps 09/26/2014  . BACK PAIN, LEFT 12/22/2009  . PROSTATE SPECIFIC ANTIGEN, ELEVATED 12/20/2008  . LATERAL EPICONDYLITIS 07/15/2008  . ACTINIC KERATOSIS, FOREHEAD, LEFT 05/23/2008  . WART, LEFT HAND 07/19/2007  . ERECTILE DYSFUNCTION, MILD 07/19/2007  . HERNIA, BILATERAL INGUINAL W/O OBST/GANGRENE 07/19/2007  .  MENISCUS TEAR 07/19/2007  . Bilateral inguinal hernia 07/19/2007    Carey Bullocks, OTR/L 03/11/2020, 2:39 PM  [facility] [address] [place], [place], [address] Phone: [phone]   Fax:  [phone]  Name: Ruben Chavez MRN: [id number] Date of Birth: [birth year]

## 2020-03-11 NOTE — Patient Instructions (Signed)
  Please complete the assigned speech therapy homework prior to your next session and return it to the speech therapist at your next visit.  

## 2020-03-11 NOTE — Therapy (Signed)
[facility] [address] [place], [place], [address] Phone: [phone]   Fax:  [phone]  Speech Language Pathology Treatment  Patient Details  Name: Ruben Chavez MRN: [id number] Date of Birth: 1951-01-31 Referring Provider (SLP): Lauraine Rinne, PA-C   Encounter Date: 03/11/2020  End of Session - 03/11/20 1446    Visit Number  2    Number of Visits  17    Date for SLP Re-Evaluation  04/19/20    Authorization Type  one copay for multiple disciplines if same day    SLP Start Time  1320    SLP Stop Time   1404    SLP Time Calculation (min)  44 min    Activity Tolerance  Patient tolerated treatment well       Past Medical History:  Diagnosis Date  . Elevated PSA   . Hyperlipidemia    pt says not anymore  . Hypertension   . Prostate cancer ([place]) 2013    Past Surgical History:  Procedure Laterality Date  . bilateral inguinal hernia  2008  . BUBBLE STUDY  01/28/2020   Procedure: BUBBLE STUDY;  Surgeon: [name], MD;  Location: [facility] ENDOSCOPY;  Service: Cardiovascular;;  . CYST REMOVAL TRUNK  2016   sebaceous cyst  . CYSTOSCOPY  04/22/2016   Procedure: CYSTOSCOPY;  Surgeon: [name], MD;  Location: [facility];  Service: Urology;;  . IR ANGIO VERTEBRAL SEL SUBCLAVIAN INNOMINATE UNI R MOD SED  01/25/2020  . IR CT HEAD LTD  01/25/2020  . IR PERCUTANEOUS ART THROMBECTOMY/INFUSION INTRACRANIAL INC DIAG ANGIO  01/25/2020  . KNEE ARTHROSCOPY  2007   right  . LOOP RECORDER INSERTION N/A 01/28/2020   Procedure: LOOP RECORDER INSERTION;  Surgeon: [name], MD;  Location: [facility] CV LAB;  Service: Cardiovascular;  Laterality: N/A;  . PROSTATE BIOPSY  2008  . PROSTATE BIOPSY  2013  . PROSTATE BIOPSY  01/28/2016  . RADIOACTIVE SEED IMPLANT N/A 04/22/2016   Procedure: RADIOACTIVE SEED IMPLANT/BRACHYTHERAPY IMPLANT;  Surgeon: [name], MD;  Location: [facility];  Service: Urology;  Laterality: N/A;  . RADIOLOGY WITH ANESTHESIA N/A 01/24/2020   Procedure: IR WITH ANESTHESIA;  Surgeon: [name], MD;  Location: [facility];  Service: Radiology;  Laterality: N/A;  . TEE WITHOUT CARDIOVERSION N/A 01/28/2020   Procedure: TRANSESOPHAGEAL ECHOCARDIOGRAM (TEE);  Surgeon: [name], MD;  Location: [facility] ENDOSCOPY;  Service: Cardiovascular;  Laterality: N/A;  . TONSILLECTOMY    . TOTAL HIP ARTHROPLASTY  2001   left  . TOTAL HIP ARTHROPLASTY Right 01/18/2018   Procedure: RIGHT TOTAL HIP ARTHROPLASTY ANTERIOR APPROACH;  Surgeon: [name], MD;  Location: [facility];  Service: Orthopedics;  Laterality: Right;    There were no vitals filed for this visit.  Subjective Assessment - 03/11/20 1322    Subjective  Pt arrives with wife.    Patient is accompained by:  Family member   wife   Currently in Pain?  No/denies            ADULT SLP TREATMENT - 03/11/20 1324      General Information   Behavior/Cognition  Alert;Impulsive;Distractible;Decreased sustained attention;Cooperative      Treatment Provided   Treatment provided  Cognitive-Linquistic      Cognitive-Linquistic Treatment   Treatment focused on  Cognition    Skilled Treatment  Completed Cognitive Linguistic Quick Test today. Pt with obvious difficulty with attention and impulsivity - very tangential in  conversation. He reported (and wife agreed) that impatience and "getting things done" are strong traits pre CVA. SLP told pt/wife that post-CVA sometimes traits like these are magnified. Wife then nodded her head, definitively, behind pt. SLP invited pt to see the interim steps from where pt is now to independence.       Assessment / Recommendations / Plan   Plan  Continue with current plan of care;Goals updated      Progression Toward Goals   Progression toward goals  Progressing toward goals       SLP Education - 03/11/20 1449    Education Details  some personality traits can  be magnified post-CVA    Person(s) Educated  Patient;Spouse    Methods  Explanation    Comprehension  Verbalized understanding;Need further instruction       SLP Short Term Goals - 03/11/20 1442      SLP SHORT TERM GOAL #1   Baseline  Patient will use compensatory aids/strategies (electronic or paper) with occasional cues from spouse to be on time for appointments x3 sessions    Time  4    Period  Weeks    Status  On-going      SLP SHORT TERM GOAL #2   Title  Patient will complete standardized cognitive testing within the first 3 sessions.    Time  2    Period  Weeks    Status  On-going      SLP SHORT TERM GOAL #3   Title  Patient will demonstrate appropriate topic maintenance over 5 minute conversation with occasional min cues x2 sessions.    Time  4    Period  Weeks    Status  On-going      SLP SHORT TERM GOAL #4   Title  pt will demo selective attention adequate to complete a 5-minute therapy task in 3 sessions    Time  3    Period  Weeks    Status  New      SLP SHORT TERM GOAL #5   Title  pt will demo functional organization in a therapy task, with occasional mod A    Time  3    Period  Weeks    Status  New       SLP Long Term Goals - 03/11/20 1444      SLP LONG TERM GOAL #1   Title  Patient will use compensatory aids/strategies (electronic or paper) to manage schedule, appointments, and daily tasks with rare min A x 4 sessions.    Time  7    Period  Weeks    Status  On-going      SLP LONG TERM GOAL #2   Title  Pt will ID and correct errors on financial documents with rare min A over 3 sessions    Time  7    Period  Weeks    Status  On-going      SLP LONG TERM GOAL #3   Title  Patient will demonstrate appropriate topic maintenance over 8 minute conversation with modified independence x 2 sessions.    Time  7    Period  Weeks    Status  On-going      SLP LONG TERM GOAL #4   Title  pt will demo selective attention adequate for completing a mod complex  15-minute task, over three sessions    Time  7    Period  Weeks    Status  New  Patient will benefit from skilled therapeutic intervention in order to improve the following deficits and impairments:   Cognitive communication deficit  Dysarthria and anarthria    Problem List Patient Active Problem List   Diagnosis Date Noted  . Chronic bilateral low back pain without sciatica 02/25/2020  . Spasticity as late effect of cerebrovascular accident (CVA) 02/25/2020  . Cognitive and neurobehavioral dysfunction   . Hypertensive urgency 01/28/2020  . Urinary retention 01/28/2020  . Right middle cerebral artery stroke ([place]) 01/28/2020  . Stroke (cerebrum) ([place]) -  [name] 01/25/2020  . Acute respiratory failure with hypoxia ([place])   . Hypotension   . Hypokalemia   . Hypocalcemia   . Encephalopathy acute   . Stroke ([place]) 01/24/2020  . Middle cerebral artery embolism, right 01/24/2020  . Hyperlipidemia 02/21/2019  . Impingement syndrome of right shoulder region 02/21/2018  . OA (osteoarthritis) of hip 01/18/2018  . Prostate cancer ([place]) 03/04/2016  . Essential hypertension 11/27/2014  . History of colonic polyps 09/26/2014  . BACK PAIN, LEFT 12/22/2009  . PROSTATE SPECIFIC ANTIGEN, ELEVATED 12/20/2008  . LATERAL EPICONDYLITIS 07/15/2008  . ACTINIC KERATOSIS, FOREHEAD, LEFT 05/23/2008  . WART, LEFT HAND 07/19/2007  . ERECTILE DYSFUNCTION, MILD 07/19/2007  . HERNIA, BILATERAL INGUINAL W/O OBST/GANGRENE 07/19/2007  . MENISCUS TEAR 07/19/2007  . Bilateral inguinal hernia 07/19/2007    [facility] ,MS, CCC-SLP  03/11/2020, 2:50 PM  [facility] [address] [place], [place], [address] Phone: [phone]   Fax:  [phone]   Name: RONDEY FALLEN MRN: [id number] Date of Birth: 12-23-1950

## 2020-03-14 ENCOUNTER — Ambulatory Visit: Payer: PPO | Admitting: Occupational Therapy

## 2020-03-14 ENCOUNTER — Other Ambulatory Visit: Payer: Self-pay

## 2020-03-14 DIAGNOSIS — R414 Neurologic neglect syndrome: Secondary | ICD-10-CM

## 2020-03-14 DIAGNOSIS — R208 Other disturbances of skin sensation: Secondary | ICD-10-CM

## 2020-03-14 DIAGNOSIS — R2689 Other abnormalities of gait and mobility: Secondary | ICD-10-CM

## 2020-03-14 DIAGNOSIS — R41840 Attention and concentration deficit: Secondary | ICD-10-CM

## 2020-03-14 DIAGNOSIS — I69318 Other symptoms and signs involving cognitive functions following cerebral infarction: Secondary | ICD-10-CM

## 2020-03-14 DIAGNOSIS — I69354 Hemiplegia and hemiparesis following cerebral infarction affecting left non-dominant side: Secondary | ICD-10-CM | POA: Diagnosis not present

## 2020-03-14 DIAGNOSIS — R29818 Other symptoms and signs involving the nervous system: Secondary | ICD-10-CM

## 2020-03-14 DIAGNOSIS — R2681 Unsteadiness on feet: Secondary | ICD-10-CM

## 2020-03-14 DIAGNOSIS — R278 Other lack of coordination: Secondary | ICD-10-CM

## 2020-03-14 NOTE — Therapy (Signed)
[facility] [address] [place], [place], [address] Phone: [phone]   Fax:  [phone]  Occupational Therapy Treatment  Patient Details  Name: Ruben Chavez MRN: [id number] Date of Birth: [birth year] Referring Provider (OT): Lauraine Rinne, PA-C   Encounter Date: 03/14/2020  OT End of Session - 03/14/20 1216    Visit Number  5    Number of Visits  17    Date for OT Re-Evaluation  04/22/20    Authorization Type  [employer] (follow Medicare)--1 copay for multiple visits    Authorization Time Period  cert date 3/84/53-6/46/80    Authorization - Visit Number  5    Authorization - Number of Visits  10    Progress Note Due on Visit  10    OT Start Time  1105    OT Stop Time  1145    OT Time Calculation (min)  40 min    Activity Tolerance  Patient tolerated treatment well    Behavior During Therapy  [facility] for tasks assessed/performed       Past Medical History:  Diagnosis Date  . Elevated PSA   . Hyperlipidemia    pt says not anymore  . Hypertension   . Prostate cancer ([place]) 2013    Past Surgical History:  Procedure Laterality Date  . bilateral inguinal hernia  2008  . BUBBLE STUDY  01/28/2020   Procedure: BUBBLE STUDY;  Surgeon: [name], MD;  Location: [facility] ENDOSCOPY;  Service: Cardiovascular;;  . CYST REMOVAL TRUNK  2016   sebaceous cyst  . CYSTOSCOPY  04/22/2016   Procedure: CYSTOSCOPY;  Surgeon: [name], MD;  Location: [facility];  Service: Urology;;  . IR ANGIO VERTEBRAL SEL SUBCLAVIAN INNOMINATE UNI R MOD SED  01/25/2020  . IR CT HEAD LTD  01/25/2020  . IR PERCUTANEOUS ART THROMBECTOMY/INFUSION INTRACRANIAL INC DIAG ANGIO  01/25/2020  . KNEE ARTHROSCOPY  2007   right  . LOOP RECORDER INSERTION N/A 01/28/2020   Procedure: LOOP RECORDER INSERTION;  Surgeon: [name], MD;  Location: [facility] CV [facility];  Service: Cardiovascular;  Laterality: N/A;  . PROSTATE BIOPSY   2008  . PROSTATE BIOPSY  2013  . PROSTATE BIOPSY  01/28/2016  . RADIOACTIVE SEED IMPLANT N/A 04/22/2016   Procedure: RADIOACTIVE SEED IMPLANT/BRACHYTHERAPY IMPLANT;  Surgeon: [name], MD;  Location: [facility];  Service: Urology;  Laterality: N/A;  . RADIOLOGY WITH ANESTHESIA N/A 01/24/2020   Procedure: IR WITH ANESTHESIA;  Surgeon: [name], MD;  Location: [facility];  Service: Radiology;  Laterality: N/A;  . TEE WITHOUT CARDIOVERSION N/A 01/28/2020   Procedure: TRANSESOPHAGEAL ECHOCARDIOGRAM (TEE);  Surgeon: [name], MD;  Location: [facility] ENDOSCOPY;  Service: Cardiovascular;  Laterality: N/A;  . TONSILLECTOMY    . TOTAL HIP ARTHROPLASTY  2001   left  . TOTAL HIP ARTHROPLASTY Right 01/18/2018   Procedure: RIGHT TOTAL HIP ARTHROPLASTY ANTERIOR APPROACH;  Surgeon: [name], MD;  Location: [facility];  Service: Orthopedics;  Laterality: Right;    There were no vitals filed for this visit.  Subjective Assessment - 03/14/20 1212    Subjective   I really want to do that again (environmental scanning).  That really suprised me.  I won't be able to drive if I can't even do that.  (wife had surgery Wednesday, but is home now and doing well)    Patient is accompanied by:  --   wife   Pertinent History  PMH:  hyperlipidemia, hypertension, prostate cancer radioactive seed implant 2017, R total hip arthroplasty, loop recorder insertion    Limitations  loop recorder    Patient Stated Goals  get my L hand and fingers working    Currently in Pain?  No/denies       Pt reports that he has ordered NMES unit per parameters given by therapist last session (expected to arrive next week).    Edema massage to L hand/wrist followed by PROM to fingers/thumb and wrist in flex/ext.  Reviewed HEP (pt cued to perform finger ROM with each finger/thumb individually and to ensure that he is bending at all 3 joints).  AROM, AAROM, PROM in supination.    NMESx 7 min to finger flexors  50pps, 250 pulse width, 10sec cycle, 2sec ramp, intensity=74-82 with no adverse reactions (incr response with longer duration and pt able to incr activation with effort inconsistently). Then same parameters for wrist/finger extension x 4 min with minimal response (no adverse reactions).  Shoulder flex AROM with min-mod cueing for normal movement patterns, improved with repetition/cueing.  Practiced and encouraged pt to attempt to reach for and pick up empty bottle with LUE and both UEs to encourage normal movement patterns, min cueing provided.    Environmental scanning in min distracting environment with 7/12 items located initially, with 4 additional on 2nd pass, last item with mod cueing.  4/5 items missed were located on R side.  Discussed relevance to IADLs.         OT Education - 03/14/20 1213    Education Details  Proper positioning of LUE with functional reach, reviewed initial HEP and edema management techniques as well as proper splint wear    Person(s) Educated  Patient;Spouse    Methods  Explanation;Demonstration;Tactile cues;Verbal cues    Comprehension  Verbalized understanding;Returned demonstration;Verbal cues required       OT Short Term Goals - 02/25/20 1218      OT SHORT TERM GOAL #1   Title  Pt will be independent with initial HEP--check STG 03/24/20    Time  4    Period  Weeks    Status  On-going      OT SHORT TERM GOAL #2   Title  Pt will verbalize understanding of proper positioning of LUE to prevent pain/injury and edema management techniques.    Time  4    Period  Weeks    Status  On-going      OT SHORT TERM GOAL #3   Title  Pt will be independent with splint wear/care for improved positioning.    Time  4    Period  Weeks    Status  On-going      OT SHORT TERM GOAL #4   Title  Pt will be able to use LUE as a stabilizer for simple ADL tasks.    Time  4    Period  Weeks    Status  New      OT SHORT TERM GOAL #5   Title  Pt will demo at least 25%  gross finger flex in prep for functional grasp.    Baseline  4    Time  4    Period  Weeks    Status  New        OT Long Term Goals - 03/14/20 1209      OT LONG TERM GOAL #1   Title  Pt will be independent with updated HEP.--check LTGs 04/23/20    Time  8  Period  Weeks    Status  New      OT LONG TERM GOAL #2   Title  Pt will be able to use LUE as nondominant assist for ADLs at least 50% of the time.    Time  8    Period  Weeks    Status  New      OT LONG TERM GOAL #3   Title  Pt will be able to score at least 10 on box and blocks test with LUE for improved coordination for ADLs.    Time  8    Period  Weeks    Status  New      OT LONG TERM GOAL #4   Title  Pt will complete clothing fasteners mod I using AE prn.    Time  8    Period  Weeks    Status  New      OT LONG TERM GOAL #5   Title  Pt will demo improved attention/visual scanning to be able to perform environmental scanning in busy enivornment with at least 95% accuracy for incr safety for community activities/driving    Baseline  03/14/20;  58% initially    Time  8    Period  Weeks    Status  New      OT LONG TERM GOAL #6   Title  Pt will demo at least 75% gross finger flex/ext for grasp/release of objects.    Time  8    Period  Weeks    Status  New            Plan - 03/14/20 1216    Clinical Impression Statement  Pt with improved awareness of movement and deficits today.  Pt demo improved positioning of L shoulder during reach, improved supination, and ability to initiate (inconsistent) finger movement with NMES (only while on).    Occupational performance deficits (Please refer to evaluation for details):  ADL's;IADL's;Leisure;Social Participation;Work    Marketing executive / Function / Physical Skills  ADL;Dexterity;ROM;IADL;Sensation;Mobility;Strength;FMC;Coordination;Decreased knowledge of precautions;Decreased knowledge of use of DME;Pain;UE functional use;GMC;Proprioception    Cognitive Skills   Attention;Memory;Perception;Safety Awareness    Rehab Potential  Fair    OT Frequency  2x / week    OT Duration  8 weeks    OT Treatment/Interventions  Self-care/ADL training;Moist Heat;Fluidtherapy;DME and/or AE instruction;Splinting;Therapeutic activities;Contrast Bath;Aquatic Therapy;Ultrasound;Therapeutic exercise;Cognitive remediation/compensation;Visual/perceptual remediation/compensation;Passive range of motion;Functional Mobility Training;Neuromuscular education;Cryotherapy;Electrical Stimulation;Paraffin;Energy conservation;Manual Therapy;Patient/family education    Plan  Pt may bring in NMES unit next week when it arrives--pt will need education (video?/written parameters prior to home use), continue with neuro re-ed LUE, environmental scanning again (pt request)    Consulted and Agree with Plan of Care  Patient;Family member/caregiver    Family Member Consulted  wife       Patient will benefit from skilled therapeutic intervention in order to improve the following deficits and impairments:   Body Structure / Function / Physical Skills: ADL, Dexterity, ROM, IADL, Sensation, Mobility, Strength, FMC, Coordination, Decreased knowledge of precautions, Decreased knowledge of use of DME, Pain, UE functional use, GMC, Proprioception Cognitive Skills: Attention, Memory, Perception, Safety Awareness     Visit Diagnosis: Hemiplegia and hemiparesis following cerebral infarction affecting left non-dominant side (HCC)  Other disturbances of skin sensation  Attention and concentration deficit  Other lack of coordination  Unsteadiness on feet  Other symptoms and signs involving cognitive functions following cerebral infarction  Neurologic neglect syndrome  Other abnormalities of gait and mobility  Other  symptoms and signs involving the nervous system    Problem List Patient Active Problem List   Diagnosis Date Noted  . Chronic bilateral low back pain without sciatica 02/25/2020   . Spasticity as late effect of cerebrovascular accident (CVA) 02/25/2020  . Cognitive and neurobehavioral dysfunction   . Hypertensive urgency 01/28/2020  . Urinary retention 01/28/2020  . Right middle cerebral artery stroke ([place]) 01/28/2020  . Stroke (cerebrum) ([place]) -  [name] 01/25/2020  . Acute respiratory failure with hypoxia ([place])   . Hypotension   . Hypokalemia   . Hypocalcemia   . Encephalopathy acute   . Stroke ([place]) 01/24/2020  . Middle cerebral artery embolism, right 01/24/2020  . Hyperlipidemia 02/21/2019  . Impingement syndrome of right shoulder region 02/21/2018  . OA (osteoarthritis) of hip 01/18/2018  . Prostate cancer ([place]) 03/04/2016  . Essential hypertension 11/27/2014  . History of colonic polyps 09/26/2014  . BACK PAIN, LEFT 12/22/2009  . PROSTATE SPECIFIC ANTIGEN, ELEVATED 12/20/2008  . LATERAL EPICONDYLITIS 07/15/2008  . ACTINIC KERATOSIS, FOREHEAD, LEFT 05/23/2008  . WART, LEFT HAND 07/19/2007  . ERECTILE DYSFUNCTION, MILD 07/19/2007  . HERNIA, BILATERAL INGUINAL W/O OBST/GANGRENE 07/19/2007  . MENISCUS TEAR 07/19/2007  . Bilateral inguinal hernia 07/19/2007    [facility] 03/14/2020, 12:24 PM  [facility] [address] [place], [place], [address] Phone: [phone]   Fax:  [phone]  Name: LAURANCE HEIDE MRN: [id number] Date of Birth: 1951-03-12   Vianne Bulls, OTR/L [facility] [address] [place], [place]  [address] [phone] phone [phone] 03/14/20 12:24 PM

## 2020-03-18 ENCOUNTER — Other Ambulatory Visit: Payer: Self-pay

## 2020-03-18 ENCOUNTER — Ambulatory Visit: Payer: PPO | Admitting: Occupational Therapy

## 2020-03-18 ENCOUNTER — Ambulatory Visit: Payer: PPO | Admitting: Physical Therapy

## 2020-03-18 ENCOUNTER — Encounter: Payer: Self-pay | Admitting: Occupational Therapy

## 2020-03-18 ENCOUNTER — Ambulatory Visit: Payer: PPO

## 2020-03-18 DIAGNOSIS — R41840 Attention and concentration deficit: Secondary | ICD-10-CM

## 2020-03-18 DIAGNOSIS — R471 Dysarthria and anarthria: Secondary | ICD-10-CM

## 2020-03-18 DIAGNOSIS — R2681 Unsteadiness on feet: Secondary | ICD-10-CM

## 2020-03-18 DIAGNOSIS — R278 Other lack of coordination: Secondary | ICD-10-CM

## 2020-03-18 DIAGNOSIS — R414 Neurologic neglect syndrome: Secondary | ICD-10-CM

## 2020-03-18 DIAGNOSIS — I69318 Other symptoms and signs involving cognitive functions following cerebral infarction: Secondary | ICD-10-CM

## 2020-03-18 DIAGNOSIS — R208 Other disturbances of skin sensation: Secondary | ICD-10-CM

## 2020-03-18 DIAGNOSIS — I69354 Hemiplegia and hemiparesis following cerebral infarction affecting left non-dominant side: Secondary | ICD-10-CM | POA: Diagnosis not present

## 2020-03-18 DIAGNOSIS — R41841 Cognitive communication deficit: Secondary | ICD-10-CM

## 2020-03-18 NOTE — Therapy (Signed)
[facility] [address] [place], [place], [address] Phone: [phone]   Fax:  [phone]  Speech Language Pathology Treatment  Patient Details  Name: Ruben Chavez MRN: [id number] Date of Birth: October 02, 1951 Referring Provider (SLP): Lauraine Rinne, PA-C   Encounter Date: 03/18/2020  End of Session - 03/18/20 1705    Visit Number  3    Number of Visits  17    Date for SLP Re-Evaluation  04/19/20    SLP Start Time  8676    SLP Stop Time   1950    SLP Time Calculation (min)  40 min    Activity Tolerance  Patient tolerated treatment well       Past Medical History:  Diagnosis Date  . Elevated PSA   . Hyperlipidemia    pt says not anymore  . Hypertension   . Prostate cancer ([place]) 2013    Past Surgical History:  Procedure Laterality Date  . bilateral inguinal hernia  2008  . BUBBLE STUDY  01/28/2020   Procedure: BUBBLE STUDY;  Surgeon: [name], MD;  Location: [facility] ENDOSCOPY;  Service: Cardiovascular;;  . CYST REMOVAL TRUNK  2016   sebaceous cyst  . CYSTOSCOPY  04/22/2016   Procedure: CYSTOSCOPY;  Surgeon: [name], MD;  Location: [facility];  Service: Urology;;  . IR ANGIO VERTEBRAL SEL SUBCLAVIAN INNOMINATE UNI R MOD SED  01/25/2020  . IR CT HEAD LTD  01/25/2020  . IR PERCUTANEOUS ART THROMBECTOMY/INFUSION INTRACRANIAL INC DIAG ANGIO  01/25/2020  . KNEE ARTHROSCOPY  2007   right  . LOOP RECORDER INSERTION N/A 01/28/2020   Procedure: LOOP RECORDER INSERTION;  Surgeon: [name], MD;  Location: [facility] CV LAB;  Service: Cardiovascular;  Laterality: N/A;  . PROSTATE BIOPSY  2008  . PROSTATE BIOPSY  2013  . PROSTATE BIOPSY  01/28/2016  . RADIOACTIVE SEED IMPLANT N/A 04/22/2016   Procedure: RADIOACTIVE SEED IMPLANT/BRACHYTHERAPY IMPLANT;  Surgeon: [name], MD;  Location: [facility];  Service: Urology;  Laterality: N/A;  . RADIOLOGY WITH ANESTHESIA  N/A 01/24/2020   Procedure: IR WITH ANESTHESIA;  Surgeon: [name], MD;  Location: [place] [place];  Service: Radiology;  Laterality: N/A;  . TEE WITHOUT CARDIOVERSION N/A 01/28/2020   Procedure: TRANSESOPHAGEAL ECHOCARDIOGRAM (TEE);  Surgeon: [name], MD;  Location: [facility] ENDOSCOPY;  Service: Cardiovascular;  Laterality: N/A;  . TONSILLECTOMY    . TOTAL HIP ARTHROPLASTY  2001   left  . TOTAL HIP ARTHROPLASTY Right 01/18/2018   Procedure: RIGHT TOTAL HIP ARTHROPLASTY ANTERIOR APPROACH;  Surgeon: [name], MD;  Location: [facility];  Service: Orthopedics;  Laterality: Right;    There were no vitals filed for this visit.  Subjective Assessment - 03/18/20 1533    Subjective  Pt checked in at 1530, SLP entered lobby at 1532 and pt not present in lobby. Pt exited restroom at 1535.    Currently in Pain?  No/denies            ADULT SLP TREATMENT - 03/18/20 1642      General Information   Behavior/Cognition  Alert;Cooperative;Pleasant mood;Impulsive;Distractible;Requires cueing;Decreased sustained attention      Treatment Provided   Treatment provided  Cognitive-Linquistic      Cognitive-Linquistic Treatment   Treatment focused on  Cognition    Skilled Treatment  Pt brought homework completed and 100% correct (counting bills and coins), and read 2/18 answers incorrectly to SLP wihtout awareness. He cont somewhat tangential in  conversation today and rejected SLP notion that this was due to decr'd attention "I'm just not feeling any sense of urgency," pt stated. In a functional attention task today pt req'd extra time and rare min-mod cues for details in the written information to obtain the correct answers.       Assessment / Recommendations / Plan   Plan  Continue with current plan of care      Progression Toward Goals   Progression toward goals  Progressing toward goals       SLP Education - 03/18/20 1704    Education Details  attention deficits manifest as tangential  conversation    Person(s) Educated  Patient    Methods  Explanation    Comprehension  Verbalized understanding       SLP Short Term Goals - 03/18/20 1708      SLP SHORT TERM GOAL #1   Title  Patient will use compensatory aids/strategies (electronic or paper) with occasional cues from spouse to be on time for appointments x3 sessions    Time  2    Period  Weeks    Status  On-going      SLP SHORT TERM GOAL #2   Title  Patient will complete standardized cognitive testing within the first 3 sessions.    Status  Achieved      SLP SHORT TERM GOAL #3   Title  Patient will demonstrate appropriate topic maintenance over 5 minute conversation with occasional min cues x2 sessions.    Time  2    Period  Weeks    Status  On-going      SLP SHORT TERM GOAL #4   Title  pt will demo selective attention adequate to complete a 5-minute therapy task in 3 sessions    Time  2    Period  Weeks    Status  On-going      SLP SHORT TERM GOAL #5   Title  pt will demo functional organization in a therapy task, with occasional mod A    Time  2    Period  Weeks    Status  On-going       SLP Long Term Goals - 03/18/20 1709      SLP LONG TERM GOAL #1   Title  Patient will use compensatory aids/strategies (electronic or paper) to manage schedule, appointments, and daily tasks with rare min A x 4 sessions.    Time  6    Period  Weeks    Status  On-going      SLP LONG TERM GOAL #2   Title  Pt will ID and correct errors on financial documents with rare min A over 3 sessions    Time  6    Period  Weeks    Status  On-going      SLP LONG TERM GOAL #3   Title  Patient will demonstrate appropriate topic maintenance over 8 minute conversation with modified independence x 2 sessions.    Time  6    Period  Weeks    Status  On-going      SLP LONG TERM GOAL #4   Title  pt will demo selective attention adequate for completing a mod complex 15-minute task, over three sessions    Time  6    Period  Weeks     Status  On-going       Plan - 03/18/20 1705    Clinical Impression Statement  Ruben Chavez presents  with what SLP suspects are mild-moderate cognitive communication deficits; pt was 57mnutes late for today's appointment due to going to restroom at his scheduled appointment time. Pt demonstrated reduced awareness/insight into his deficits, explaining away tangential conversation as not "feeling a sense of urgency". I recommend cont'd skilled ST to address cognitive communication impairment to maximize safety and independence at home.    Speech Therapy Frequency  2x / week    Duration  --   8 weeks or 17 visits   Treatment/Interventions  Environmental controls;SLP instruction and feedback;Cueing hierarchy;Compensatory techniques;Cognitive reorganization;Functional tasks;Compensatory strategies;Internal/external aids;Multimodal communcation approach;Patient/family education    Potential to Achieve Goals  Good    Consulted and Agree with Plan of Care  Patient;Family member/caregiver       Patient will benefit from skilled therapeutic intervention in order to improve the following deficits and impairments:   Cognitive communication deficit  Dysarthria and anarthria    Problem List Patient Active Problem List   Diagnosis Date Noted  . Chronic bilateral low back pain without sciatica 02/25/2020  . Spasticity as late effect of cerebrovascular accident (CVA) 02/25/2020  . Cognitive and neurobehavioral dysfunction   . Hypertensive urgency 01/28/2020  . Urinary retention 01/28/2020  . Right middle cerebral artery stroke ([place] 01/28/2020  . Stroke (cerebrum) ([place] -  [name] 01/25/2020  . Acute respiratory failure with hypoxia ([place]   . Hypotension   . Hypokalemia   . Hypocalcemia   . Encephalopathy acute   . Stroke ([place] 01/24/2020  . Middle cerebral artery embolism,  right 01/24/2020  . Hyperlipidemia 02/21/2019  . Impingement syndrome of right shoulder region 02/21/2018  . OA (osteoarthritis) of hip 01/18/2018  . Prostate cancer ([place] 03/04/2016  . Essential hypertension 11/27/2014  . History of colonic polyps 09/26/2014  . BACK PAIN, LEFT 12/22/2009  . PROSTATE SPECIFIC ANTIGEN, ELEVATED 12/20/2008  . LATERAL EPICONDYLITIS 07/15/2008  . ACTINIC KERATOSIS, FOREHEAD, LEFT 05/23/2008  . WART, LEFT HAND 07/19/2007  . ERECTILE DYSFUNCTION, MILD 07/19/2007  . HERNIA, BILATERAL INGUINAL W/O OBST/GANGRENE 07/19/2007  . MENISCUS TEAR 07/19/2007  . Bilateral inguinal hernia 07/19/2007    [facility],MS, CCC-SLP  03/18/2020, 5:11 PM  [address] [address]Phone: [phone]  Fax:  [phone]  Name: RDEJION GRILLOMRN: [id number] of Birth: 209-Mar-1952

## 2020-03-18 NOTE — Therapy (Signed)
[facility] [address] [place], [place], [address] Phone: [phone]   Fax:  [phone]  Occupational Therapy Treatment  Patient Details  Name: Ruben Chavez MRN: [id number] Date of Birth: 02-09-1951 Referring Provider (OT): Lauraine Rinne, PA-C   Encounter Date: 03/18/2020  OT End of Session - 03/18/20 1743    Visit Number  6    Number of Visits  17    Date for OT Re-Evaluation  04/22/20    Authorization Type  [employer] (follow Medicare)--1 copay for multiple visits    Authorization Time Period  cert date 3/47/42-5/95/63    Authorization - Visit Number  6    Authorization - Number of Visits  10    Progress Note Due on Visit  10    OT Start Time  1620    OT Stop Time  1700    OT Time Calculation (min)  40 min    Activity Tolerance  Patient tolerated treatment well    Behavior During Therapy  [facility] for tasks assessed/performed       Past Medical History:  Diagnosis Date  . Elevated PSA   . Hyperlipidemia    pt says not anymore  . Hypertension   . Prostate cancer ([place]) 2013    Past Surgical History:  Procedure Laterality Date  . bilateral inguinal hernia  2008  . BUBBLE STUDY  01/28/2020   Procedure: BUBBLE STUDY;  Surgeon: [name], MD;  Location: [facility] ENDOSCOPY;  Service: Cardiovascular;;  . CYST REMOVAL TRUNK  2016   sebaceous cyst  . CYSTOSCOPY  04/22/2016   Procedure: CYSTOSCOPY;  Surgeon: [name], MD;  Location: [facility];  Service: Urology;;  . IR ANGIO VERTEBRAL SEL SUBCLAVIAN INNOMINATE UNI R MOD SED  01/25/2020  . IR CT HEAD LTD  01/25/2020  . IR PERCUTANEOUS ART THROMBECTOMY/INFUSION INTRACRANIAL INC DIAG ANGIO  01/25/2020  . KNEE ARTHROSCOPY  2007   right  . LOOP RECORDER INSERTION N/A 01/28/2020   Procedure: LOOP RECORDER INSERTION;  Surgeon: [name], MD;  Location: [facility];  Service: Cardiovascular;  Laterality: N/A;  . PROSTATE BIOPSY   2008  . PROSTATE BIOPSY  2013  . PROSTATE BIOPSY  01/28/2016  . RADIOACTIVE SEED IMPLANT N/A 04/22/2016   Procedure: RADIOACTIVE SEED IMPLANT/BRACHYTHERAPY IMPLANT;  Surgeon: [name], MD;  Location: [facility];  Service: Urology;  Laterality: N/A;  . RADIOLOGY WITH ANESTHESIA N/A 01/24/2020   Procedure: IR WITH ANESTHESIA;  Surgeon: [name], MD;  Location: [facility];  Service: Radiology;  Laterality: N/A;  . TEE WITHOUT CARDIOVERSION N/A 01/28/2020   Procedure: TRANSESOPHAGEAL ECHOCARDIOGRAM (TEE);  Surgeon: [name], MD;  Location: [facility] ENDOSCOPY;  Service: Cardiovascular;  Laterality: N/A;  . TONSILLECTOMY    . TOTAL HIP ARTHROPLASTY  2001   left  . TOTAL HIP ARTHROPLASTY Right 01/18/2018   Procedure: RIGHT TOTAL HIP ARTHROPLASTY ANTERIOR APPROACH;  Surgeon: [name], MD;  [facility];  Service: Orthopedics;  Laterality: Right;    There were no vitals filed for this visit.  Subjective Assessment - 03/18/20 1739    Subjective   It has been puffy.  Patient arrived with edema glove on under wrist brace - when removed - pitting edema on dorsum of hand and volar wrist.    Pertinent History  PMH:  hyperlipidemia, hypertension, prostate cancer radioactive seed implant 2017, R total hip arthroplasty, loop recorder insertion    Currently in Pain?  No/denies    Pain Score  0-No pain                   OT Treatments/Exercises (OP) - 03/18/20 0001      Neurological Re-education Exercises   Other Exercises 1  Patient arrived with NMS unit for home use.  Worked to set up initial use of NMS unit.  Set up parmeters for _0 , 250 pulse width, 2 sec ramp - able to elicit wrist/digit extension.  Patient asked to bring back unit next visit to complete home instruction fo use as exercise mode.        Manual Therapy   Manual Therapy  Other (comment)    Other Manual Therapy  Edema massage to left hand and wrist - pitting edema on dorsum of hand and  volar wrist at beginning of session- reveiewed improtance of elevation.  Patient wearing edema glove.               OT Education - 03/18/20 1742    Education Details  Initial set up of home NMS unit- patient needs further instruction before allowed for home use    Person(s) Educated  Patient    Methods  Explanation;Demonstration    Comprehension  Need further instruction       OT Short Term Goals - 02/25/20 1218      OT SHORT TERM GOAL #1   Title  Pt will be independent with initial HEP--check STG 03/24/20    Time  4    Period  Weeks    Status  On-going      OT SHORT TERM GOAL #2   Title  Pt will verbalize understanding of proper positioning of LUE to prevent pain/injury and edema management techniques.    Time  4    Period  Weeks    Status  On-going      OT SHORT TERM GOAL #3   Title  Pt will be independent with splint wear/care for improved positioning.    Time  4    Period  Weeks    Status  On-going      OT SHORT TERM GOAL #4   Title  Pt will be able to use LUE as a stabilizer for simple ADL tasks.    Time  4    Period  Weeks    Status  New      OT SHORT TERM GOAL #5   Title  Pt will demo at least 25% gross finger flex in prep for functional grasp.    Baseline  4    Time  4    Period  Weeks    Status  New        OT Long Term Goals - 03/14/20 1209      OT LONG TERM GOAL #1   Title  Pt will be independent with updated HEP.--check LTGs 04/23/20    Time  8    Period  Weeks    Status  New      OT LONG TERM GOAL #2   Title  Pt will be able to use LUE as nondominant assist for ADLs at least 50% of the time.    Time  8    Period  Weeks    Status  New      OT LONG TERM GOAL #3   Title  Pt will be able to score at least 10 on box and blocks test with LUE for improved coordination for ADLs.  Time  8    Period  Weeks    Status  New      OT LONG TERM GOAL #4   Title  Pt will complete clothing fasteners mod I using AE prn.    Time  8    Period  Weeks     Status  New      OT LONG TERM GOAL #5   Title  Pt will demo improved attention/visual scanning to be able to perform environmental scanning in busy enivornment with at least 95% accuracy for incr safety for community activities/driving    Baseline  03/14/20;  58% initially    Time  8    Period  Weeks    Status  New      OT LONG TERM GOAL #6   Title  Pt will demo at least 75% gross finger flex/ext for grasp/release of objects.    Time  8    Period  Weeks    Status  New            Plan - 03/18/20 1744    Clinical Impression Statement  Patient with significant edema in hand today - has been less prevalent prior sessions.  Patient has purchased home unti NMS to help promote wrist and hand movement, help control edema in LUE.    OT Frequency  2x / week    OT Duration  8 weeks    OT Treatment/Interventions  Self-care/ADL training;Moist Heat;Fluidtherapy;DME and/or AE instruction;Splinting;Therapeutic activities;Contrast Bath;Aquatic Therapy;Ultrasound;Therapeutic exercise;Cognitive remediation/compensation;Visual/perceptual remediation/compensation;Passive range of motion;Functional Mobility Training;Neuromuscular education;Cryotherapy;Electrical Stimulation;Paraffin;Energy conservation;Manual Therapy;Patient/family education    Plan  Pt will bring in NMES unit next visit -pt will need education (video?/written parameters prior to home use), continue with neuro re-ed LUE, environmental scanning again (pt request)    Consulted and Agree with Plan of Care  Patient       Patient will benefit from skilled therapeutic intervention in order to improve the following deficits and impairments:           Visit Diagnosis: Hemiplegia and hemiparesis following cerebral infarction affecting left non-dominant side (HCC)  Other disturbances of skin sensation  Attention and concentration deficit  Other lack of coordination  Unsteadiness on feet  Neurologic neglect syndrome  Other  symptoms and signs involving cognitive functions following cerebral infarction    Problem List Patient Active Problem List   Diagnosis Date Noted  . Chronic bilateral low back pain without sciatica 02/25/2020  . Spasticity as late effect of cerebrovascular accident (CVA) 02/25/2020  . Cognitive and neurobehavioral dysfunction   . Hypertensive urgency 01/28/2020  . Urinary retention 01/28/2020  . Right middle cerebral artery stroke ([place]) 01/28/2020  . Stroke (cerebrum) ([place]) -  [name] 01/25/2020  . Acute respiratory failure with hypoxia ([place])   . Hypotension   . Hypokalemia   . Hypocalcemia   . Encephalopathy acute   . Stroke ([place]) 01/24/2020  . Middle cerebral artery embolism, right 01/24/2020  . Hyperlipidemia 02/21/2019  . Impingement syndrome of right shoulder region 02/21/2018  . OA (osteoarthritis) of hip 01/18/2018  . Prostate cancer ([place]) 03/04/2016  . Essential hypertension 11/27/2014  . History of colonic polyps 09/26/2014  . BACK PAIN, LEFT 12/22/2009  . PROSTATE SPECIFIC ANTIGEN, ELEVATED 12/20/2008  . LATERAL EPICONDYLITIS 07/15/2008  . ACTINIC KERATOSIS, FOREHEAD, LEFT 05/23/2008  . WART, LEFT HAND 07/19/2007  . ERECTILE DYSFUNCTION, MILD  07/19/2007  . HERNIA, BILATERAL INGUINAL W/O OBST/GANGRENE 07/19/2007  . MENISCUS TEAR 07/19/2007  . Bilateral inguinal hernia 07/19/2007    Mariah Milling, OTR/L 03/18/2020, 5:49 PM  [facility] [address] [place], [place], [address] Phone: [phone]   Fax:  [phone]  Name: Ruben Chavez MRN: [id number] Date of Birth: Dec 18, 1950

## 2020-03-18 NOTE — Therapy (Signed)
[facility] [address] [place], [place], [address] Phone: [phone]   Fax:  [phone]  Physical Therapy Treatment  Patient Details  Name: Ruben Chavez MRN: [id number] Date of Birth: Apr 25, 1951 Referring Provider (PT): Lauraine Rinne, PA-C (pt will be following up with Dr. Dagoberto Ligas)   Encounter Date: 03/18/2020  PT End of Session - 03/18/20 2100    Visit Number  2    Number of Visits  17    Date for PT Re-Evaluation  05/15/20   written for 60 day POC   Authorization Type  [employer]    PT Start Time  1703    PT Stop Time  1744    PT Time Calculation (min)  41 min    Equipment Utilized During Treatment  Gait belt    Activity Tolerance  Patient tolerated treatment well    Behavior During Therapy  [facility] for tasks assessed/performed       Past Medical History:  Diagnosis Date  . Elevated PSA   . Hyperlipidemia    pt says not anymore  . Hypertension   . Prostate cancer ([place]) 2013    Past Surgical History:  Procedure Laterality Date  . bilateral inguinal hernia  2008  . BUBBLE STUDY  01/28/2020   Procedure: BUBBLE STUDY;  Surgeon: [name], MD;  Location: [facility] ENDOSCOPY;  Service: Cardiovascular;;  . CYST REMOVAL TRUNK  2016   sebaceous cyst  . CYSTOSCOPY  04/22/2016   Procedure: CYSTOSCOPY;  Surgeon: [name], MD;  Location: [facility];  Service: Urology;;  . IR ANGIO VERTEBRAL SEL SUBCLAVIAN INNOMINATE UNI R MOD SED  01/25/2020  . IR CT HEAD LTD  01/25/2020  . IR PERCUTANEOUS ART THROMBECTOMY/INFUSION INTRACRANIAL INC DIAG ANGIO  01/25/2020  . KNEE ARTHROSCOPY  2007   right  . LOOP RECORDER INSERTION N/A 01/28/2020   Procedure: LOOP RECORDER INSERTION;  Surgeon: [name], MD;  Location: [facility];  Service: Cardiovascular;  Laterality: N/A;  . PROSTATE BIOPSY  2008  . PROSTATE BIOPSY  2013  . PROSTATE BIOPSY  01/28/2016  . RADIOACTIVE SEED IMPLANT  N/A 04/22/2016   Procedure: RADIOACTIVE SEED IMPLANT/BRACHYTHERAPY IMPLANT;  Surgeon: [name], MD;  Location: [facility];  Service: Urology;  Laterality: N/A;  . RADIOLOGY WITH ANESTHESIA N/A 01/24/2020   Procedure: IR WITH ANESTHESIA;  Surgeon: [name], MD;  Location: [facility];  Service: Radiology;  Laterality: N/A;  . TEE WITHOUT CARDIOVERSION N/A 01/28/2020   Procedure: TRANSESOPHAGEAL ECHOCARDIOGRAM (TEE);  Surgeon: [name], MD;  Location: [facility] ENDOSCOPY;  Service: Cardiovascular;  Laterality: N/A;  . TONSILLECTOMY    . TOTAL HIP ARTHROPLASTY  2001   left  . TOTAL HIP ARTHROPLASTY Right 01/18/2018   Procedure: RIGHT TOTAL HIP ARTHROPLASTY ANTERIOR APPROACH;  Surgeon: [name], MD;  Location: [facility] OR[facility];  Service: Orthopedics;  Laterality: Right;    There were no vitals filed for this visit.  Subjective Assessment - 03/18/20 1706    Subjective  His wife had his hip replaced - has been helping out a lot around the house, including carrying up a walker up and down the stairs. No falls. Balance has been fine.    Patient is accompained by:  Family member   wife katherine   Pertinent History  hyperlipidemia, hypertension, prostate cancer with radioactive seed implant, remote tobacco abuse.    Diagnostic tests  Cranial CT scan showed subacute infarction right lateral temporal lobe  Patient Stated Goals  wants to get left hand and arm working, wants to improve his balance and changing direction (per wife).    Currently in Pain?  No/denies         [facility] PT Assessment - 03/18/20 1709      High Level Balance   High Level Balance Comments  SLS: 10 seconds B      Functional Gait  Assessment   Gait assessed   Yes    Gait Level Surface  Walks 20 ft, slow speed, abnormal gait pattern, evidence for imbalance or deviates 10-15 in outside of the 12 in walkway width. Requires more than 7 sec to ambulate 20 ft.    Change in Gait Speed  Able to smoothly change  walking speed without loss of balance or gait deviation. Deviate no more than 6 in outside of the 12 in walkway width.    Gait with Horizontal Head Turns  Performs head turns smoothly with slight change in gait velocity (eg, minor disruption to smooth gait path), deviates 6-10 in outside 12 in walkway width, or uses an assistive device.    Gait with Vertical Head Turns  Performs head turns with no change in gait. Deviates no more than 6 in outside 12 in walkway width.    Gait and Pivot Turn  Pivot turns safely within 3 sec and stops quickly with no loss of balance.    Step Over Obstacle  Is able to step over 2 stacked shoe boxes taped together (9 in total height) without changing gait speed. No evidence of imbalance.    Gait with Narrow Base of Support  Is able to ambulate for 10 steps heel to toe with no staggering.    Gait with Eyes Closed  Walks 20 ft, slow speed, abnormal gait pattern, evidence for imbalance, deviates 10-15 in outside 12 in walkway width. Requires more than 9 sec to ambulate 20 ft.    Ambulating Backwards  Walks 20 ft, uses assistive device, slower speed, mild gait deviations, deviates 6-10 in outside 12 in walkway width.    Steps  Alternating feet, no rail.    Total Score  24    FGA comment:  24/30                 Access Code: [email or web address] URL: [email or web address] Date: 03/18/2020 Prepared by: Janann August  Initiated HEP for corner balance, educated on purpose of performing and balance strategies used:   Exercises Romberg Stance Eyes Closed on Foam Pad - 1 x daily - 5 x weekly - 3 sets - 30 hold Standing Balance with Eyes Closed on Foam - 2 x daily - 5 x weekly - 2 sets - 10 reps - 2 x10 reps head nods, 2 x 10 reps head turns with feet apart, pt needing cues to keep eyes closed during exercise        Balance Exercises - 03/18/20 2107      Balance Exercises: Standing   Standing Eyes Closed  Foam/compliant surface;Wide (BOA);Limitations     Standing Eyes Closed Limitations  on blue foam beam with wide BOS eyes closed, multiple reps approx. 10 seconds each, min guard/min A due to pt losing balance posteriorly    Other Standing Exercises  standing on blue foam beam: forward tandem and retro tandem gait x4 reps with intermittent UE support, pt at times needing cues to slow down        PT Education - 03/18/20 2100    Education Details  initial HEP - corner balance, 3 balance systems and purpose of each    Person(s) Educated  Patient    Methods  Explanation;Demonstration;Handout    Comprehension  Verbalized understanding;Returned demonstration       PT Short Term Goals - 03/18/20 2109      PT SHORT TERM GOAL #1   Title  Pt will be independent with initial HEP in order to build upon functional gains made in therapy. ALL STGS DUE 04/15/20    Time  4   due to delay in scheduling - 1st return visit 03/18/20   Period  Weeks    Status  New    Target Date  04/15/20      PT SHORT TERM GOAL #2   Title  Pt will improve FGA score to at least a 27/30 in order to indicate decr fall risk.    Baseline  24/30 on 03/18/20    Time  4    Period  Weeks    Status  Revised      PT SHORT TERM GOAL #3   Title  Pt will ambulate at least 300' over level surfaces while scanning environment with supervision and no LOB.    Time  4    Period  Weeks    Status  New      PT SHORT TERM GOAL #4   Title  Pt will decr 5x sit <> stand time to 14 seconds or less in order to indicate improved functional LE strength.    Baseline  16.69 seconds on 02/15/20    Time  4    Period  Weeks    Status  New      PT SHORT TERM GOAL #5   Title  Pt will improve gait speed to at least 2.9 ft/sec in order to demo improved gait efficiency.    Time  4    Period  Weeks    Status  New        PT Long Term Goals - 03/18/20 2110      PT LONG TERM GOAL #1   Title  Pt will be independent with final HEP in order to build upon functional gains made in therapy. ALL LTGS  DUE 05/13/20    Time  8   due to delay in scheduling   Period  Weeks    Status  New    Target Date  05/13/20      PT LONG TERM GOAL #2   Title  Pt will improve FGA score to at least a 29/30 in order to indicate decr fall risk.    Time  4    Period  Weeks    Status  New      PT LONG TERM GOAL #3   Title  30 second chair stand goal to be written as appropriate to demo improved LE strength and endurance.    Time  8    Period  Weeks    Status  New      PT LONG TERM GOAL #4   Title  Pt will improve gait speed to at least 3.4 ft/sec in order to demo improved gait efficiency in the community.    Time  8    Period  Weeks    Status  New      PT LONG TERM GOAL #5   Title  Pt will ambulate at least 500' over unlevel surfaces with mod I and scanning environment in order to improve community mobility.  Time  8    Period  Weeks    Status  New      PT LONG TERM GOAL #6   Title  Pt will perform 12 steps with no handrail and step through pattern with supervision in order to indicate improved functional LE strength.    Time  8    Period  Weeks    Status  New            Plan - 03/18/20 2111    Clinical Impression Statement  Pt returns for PT treatment today (has not been seen in a month since eval due to scheduling) - revised dates on pt's STGs and LTGs as appropriate. Performed the FGA with pt scoring a 24/30, indicating a moderate fall risk and pt with most difficulty with gait with eyes closed. Initiated HEP with focus on corner balance with eyes closed on a compliant surface with addition of head motions. Will continue to progress towards LTGs.    Personal Factors and Comorbidities  Comorbidity 3+;Past/Current Experience    Comorbidities  hyperlipidemia, hypertension, prostate cancer with radioactive seed implant, remote tobacco abuse.    Examination-Activity Limitations  Stairs;Locomotion Level    Examination-Participation Restrictions  Community Activity;Driving;Yard Work   being  on his boat   Stability/Clinical Decision Making  Stable/Uncomplicated    Clinical Decision Making  Low    Rehab Potential  Good    PT Frequency  2x / week    PT Duration  8 weeks    PT Treatment/Interventions  ADLs/Self Care Home Management;Therapeutic activities;Functional mobility training;Stair training;Gait training;Therapeutic exercise;Balance training;Neuromuscular re-education;Patient/family education    PT Next Visit Plan  eyes closed balance, gait outdoors over unlevel surfaces, functional LE strengthening, balance strategies (pt has a boat and lake house that he would like to get back to going to)    PT Home Exercise Plan  [email or web address]       Patient will benefit from skilled therapeutic intervention in order to improve the following deficits and impairments:  Abnormal gait, Decreased activity tolerance, Decreased balance, Decreased safety awareness, Difficulty walking, Decreased strength, Impaired UE functional use  Visit Diagnosis: Hemiplegia and hemiparesis following cerebral infarction affecting left non-dominant side (HCC)  Other disturbances of skin sensation  Other lack of coordination  Unsteadiness on feet     Problem List Patient Active Problem List   Diagnosis Date Noted  . Chronic bilateral low back pain without sciatica 02/25/2020  . Spasticity as late effect of cerebrovascular accident (CVA) 02/25/2020  . Cognitive and neurobehavioral dysfunction   . Hypertensive urgency 01/28/2020  . Urinary retention 01/28/2020  . Right middle cerebral artery stroke ([place]) 01/28/2020  . Stroke (cerebrum) ([place]) -  [name] 01/25/2020  . Acute respiratory failure with hypoxia ([place])   . Hypotension   . Hypokalemia   . Hypocalcemia   . Encephalopathy acute   . Stroke ([place]) 01/24/2020  . Middle cerebral artery embolism, right 01/24/2020  . Hyperlipidemia 02/21/2019  .  Impingement syndrome of right shoulder region 02/21/2018  . OA (osteoarthritis) of hip 01/18/2018  . Prostate cancer ([place]) 03/04/2016  . Essential hypertension 11/27/2014  . History of colonic polyps 09/26/2014  . BACK PAIN, LEFT 12/22/2009  . PROSTATE SPECIFIC ANTIGEN, ELEVATED 12/20/2008  . LATERAL EPICONDYLITIS 07/15/2008  . ACTINIC KERATOSIS, FOREHEAD, LEFT 05/23/2008  . WART, LEFT HAND 07/19/2007  . ERECTILE DYSFUNCTION, MILD 07/19/2007  .  HERNIA, BILATERAL INGUINAL W/O OBST/GANGRENE 07/19/2007  . MENISCUS TEAR 07/19/2007  . Bilateral inguinal hernia 07/19/2007    Arliss Journey, PT, DPT  03/18/2020, 9:15 PM  [facility] [address] [place], [place], [address] Phone: [phone]   Fax:  [phone]  Name: Ruben Chavez MRN: [id number] Date of Birth: 16-May-1951

## 2020-03-18 NOTE — Patient Instructions (Signed)
  Please complete the assigned speech therapy homework prior to your next session and return it to the speech therapist at your next visit.  

## 2020-03-18 NOTE — Patient Instructions (Signed)
Access Code: [email or web address] URL: [email or web address] Date: 03/18/2020 Prepared by: Janann August  Exercises Romberg Stance Eyes Closed on Foam Pad - 1 x daily - 5 x weekly - 3 sets - 30 hold Standing Balance with Eyes Closed on Foam - 2 x daily - 5 x weekly - 2 sets - 10 reps

## 2020-03-19 ENCOUNTER — Ambulatory Visit: Payer: PPO | Admitting: Adult Health

## 2020-03-19 ENCOUNTER — Encounter: Payer: Self-pay | Admitting: Adult Health

## 2020-03-19 VITALS — BP 128/72 | HR 67 | Temp 97.4°F | Ht 71.0 in | Wt 173.0 lb

## 2020-03-19 DIAGNOSIS — Z95818 Presence of other cardiac implants and grafts: Secondary | ICD-10-CM | POA: Diagnosis not present

## 2020-03-19 DIAGNOSIS — I10 Essential (primary) hypertension: Secondary | ICD-10-CM

## 2020-03-19 DIAGNOSIS — I639 Cerebral infarction, unspecified: Secondary | ICD-10-CM

## 2020-03-19 DIAGNOSIS — E785 Hyperlipidemia, unspecified: Secondary | ICD-10-CM

## 2020-03-19 NOTE — Progress Notes (Signed)
[facility] [address] [place]. [place] [address] [phone]       HOSPITAL FOLLOW UP NOTE  Mr. [name] Date of Birth:  1951-05-26 Medical Record Number:  [id number]   Reason for Referral:  hospital stroke follow up    SUBJECTIVE:   CHIEF COMPLAINT:  Chief Complaint  Patient presents with  . Follow-up    hospital fu, alone, treatment rm    HPI:   [name] a 69 y.o.malewith history of HTN, HLD, prostate cancer who presented on 01/24/2020 with L sided weakness, L hemisensory loss, L facial droop, L field cut, dysarthria and anosognosia.  Evaluated by stroke team with stroke work-up revealing right MCA infarct due to right MCA occlusion s/p TPA and IR with TICI 2B recanalization -embolic secondary to unknown source.  Loop recorder placed to rule out atrial fibrillation.  Recommended DAPT for 3 months then aspirin alone.  Hypertensive urgency upon arrival with BP 180/120 stabilized during admission and resumed lisinopril 20 mg daily.  No history of DM or HLD.  No prior history of stroke.  Other active problems include history of prostate cancer, hypokalemia, hypocalcemia, leukocytosis and urinary retention.  Residual deficits of mild leftfacialdroop, left hemiparesis and decreased sensation and discharged to CIR for ongoing therapy needs.  Stroke: [name]  CT head subacute R lateral temporal lobe and temporoparietal area infarcts.  CTA head &neck R M2 superior division occlusion.B ICA bifurcation atherosclerosis.   CT perfusion pseudonormalization R temporal lobe. R frontoparietal jxn infarct 46cc, R MCA 72cc.  Cerebral angio TICI2b revascularization R MCA  MRI -Patchy and scattered acute right MCA territory infarct.Petechial blood in the right temporal lobe.  2D Echo- EF 60 - 65%. No cardiac source of emboli  identified.   LE venous Doppler - negative for DVT bilaterally  TEEneg PFO or other SOE  loop placement2/22([name])   LDL54 - no statin needed given goal LDL  HgbA1c 5.0  UDS pending  Lovenox for VTE prophylaxis  No antithromboticprior to admission, now on ASA 325 mg and Plavix 75 daily. Continue DAPT for 3 months and then ASA alone.  Therapy recommendations: CIR  Disposition: CIR   Today, 03/19/2020, [name] is being seen for hospital follow-up.  Residual deficits of LUE weakness, left facial droop, dysarthria and cognitive communication deficit.  He is currently being followed by PT/OT/ST for ongoing therapy needs.  He has not been able to return to driving at this time and feels as though this is due to decreased function of left hand.  Per review of ST note, suspicion for mild to moderate cognitive communication deficits with reduced awareness/insight of deficits.  He has continued on DAPT without bleeding or bruising.  Atorvastatin 20 mg daily initiated for stroke prevention and denies myalgias.  Blood pressure stable at 128/72.  Loop recorder has not shown atrial fibrillation thus far.  Continues to follow with PCP for HTN management.  No further concerns at this time.    ROS:   14 system review of systems performed and negative with exception of weakness, speech difficulty  PMH:  Past Medical History:  Diagnosis Date  . Elevated PSA   . Hyperlipidemia    pt says not anymore  . Hypertension   . Prostate cancer (Ruben Chavez) 2013    PSH:  Past Surgical History:  Procedure Laterality Date  . bilateral inguinal hernia  2008  . BUBBLE STUDY  01/28/2020   Procedure: BUBBLE STUDY;  Surgeon: [name], MD;  Location: [facility] ENDOSCOPY;  Service: Cardiovascular;;  . CYST REMOVAL TRUNK  2016   sebaceous cyst  . CYSTOSCOPY  04/22/2016   Procedure: CYSTOSCOPY;  Surgeon: [name], MD;  Location: [facility];  Service: Urology;;  . IR ANGIO  VERTEBRAL SEL SUBCLAVIAN INNOMINATE UNI R MOD SED  01/25/2020  . IR CT HEAD LTD  01/25/2020  . IR PERCUTANEOUS ART THROMBECTOMY/INFUSION INTRACRANIAL INC DIAG ANGIO  01/25/2020  . KNEE ARTHROSCOPY  2007   right  . LOOP RECORDER INSERTION N/A 01/28/2020   Procedure: LOOP RECORDER INSERTION;  Surgeon: [name], MD;  Location: [place] [facility];  Service: Cardiovascular;  Laterality: N/A;  . PROSTATE BIOPSY  2008  . PROSTATE BIOPSY  2013  . PROSTATE BIOPSY  01/28/2016  . RADIOACTIVE SEED IMPLANT N/A 04/22/2016   Procedure: RADIOACTIVE SEED IMPLANT/BRACHYTHERAPY IMPLANT;  Surgeon: [name], MD;  Location: [facility];  Service: Urology;  Laterality: N/A;  . RADIOLOGY WITH ANESTHESIA N/A 01/24/2020   Procedure: IR WITH ANESTHESIA;  Surgeon: [name], MD;  Location: [place];  Service: Radiology;  Laterality: N/A;  . TEE WITHOUT CARDIOVERSION N/A 01/28/2020   Procedure: TRANSESOPHAGEAL ECHOCARDIOGRAM (TEE);  Surgeon: [name], MD;  Location: [facility] ENDOSCOPY;  Service: Cardiovascular;  Laterality: N/A;  . TONSILLECTOMY    . TOTAL HIP ARTHROPLASTY  2001   left  . TOTAL HIP ARTHROPLASTY Right 01/18/2018   Procedure: RIGHT TOTAL HIP ARTHROPLASTY ANTERIOR APPROACH;  Surgeon: [name], MD;  Location: [facility] ORS;  Service: Orthopedics;  Laterality: Right;    Social History:  Social History   Socioeconomic History  . Marital status: Married    Spouse name: Not on file  . Number of children: Not on file  . Years of education: Not on file  . Highest education level: Not on file  Occupational History  . Not on file  Tobacco Use  . Smoking status: Former Smoker    Packs/day: 1.00    Years: 32.00    Pack years: 32.00    Types: Cigarettes    Quit date: 06/06/1999    Years since quitting: 20.8  . Smokeless tobacco: Never Used  Substance and Sexual Activity  . Alcohol use: Yes    Alcohol/week: 14.0 standard drinks    Types: 14 Shots of liquor per week  .  Drug use: No  . Sexual activity: Yes  Other Topics Concern  . Not on file  Social History Narrative  . Not on file   Social Determinants of Health   Financial Resource Strain:   . Difficulty of Paying Living Expenses:   Food Insecurity:   . Worried About Charity fundraiser in the Last Year:   . Arboriculturist in the Last Year:   Transportation Needs:   . Film/video editor (Medical):   Marland Kitchen Lack of Transportation (Non-Medical):   Physical Activity:   . Days of Exercise per Week:   . Minutes of Exercise per Session:   Stress:   . Feeling of Stress :   Social Connections:   . Frequency of Communication with Friends and Family:   . Frequency of Social Gatherings with Friends and Family:   . Attends Religious Services:   . Active Member of Clubs or Organizations:   . Attends Archivist Meetings:   Marland Kitchen Marital Status:   Intimate Production manager  Violence:   . Fear of Current or Ex-Partner:   . Emotionally Abused:   Marland Kitchen Physically Abused:   . Sexually Abused:     Family History:  Family History  Problem Relation Age of Onset  . Pancreatic cancer Mother   . Heart disease Father   . Colon cancer Neg Hx     Medications:   Current Outpatient Medications on File Prior to Visit  Medication Sig Dispense Refill  . acetaminophen (TYLENOL) 325 MG tablet Take 2 tablets (650 mg total) by mouth every 4 (four) hours as needed for mild pain (or temp > 37.5 C (99.5 F)).    Marland Kitchen aspirin EC 325 MG EC tablet Take 1 tablet (325 mg total) by mouth daily. 30 tablet 0  . atorvastatin (LIPITOR) 20 MG tablet TAKE 1 TABLET(20 MG) BY MOUTH DAILY AT 6 PM 90 tablet 1  . buPROPion (WELLBUTRIN) 75 MG tablet TAKE 1 TABLET(75 MG) BY MOUTH TWICE DAILY AT 10 AM AND AT 4 PM 60 tablet 0  . clopidogrel (PLAVIX) 75 MG tablet TAKE 1 TABLET(75 MG) BY MOUTH DAILY 90 tablet 1  . folic acid (FOLVITE) 1 MG tablet TAKE 1 TABLET(1 MG) BY MOUTH DAILY 30 tablet 0  . lisinopril-hydrochlorothiazide (ZESTORETIC) 20-25 MG  tablet Take 1 tablet by mouth daily.    . Multiple Vitamin (MULTIVITAMIN WITH MINERALS) TABS tablet Take 1 tablet by mouth daily.    . pantoprazole (PROTONIX) 40 MG tablet TAKE 1 TABLET(40 MG) BY MOUTH DAILY 90 tablet 1  . senna-docusate (SENOKOT-S) 8.6-50 MG tablet Take 2 tablets by mouth daily.    . traMADol (ULTRAM) 50 MG tablet TAKE 1 TABLET(50 MG) BY MOUTH EVERY 6 HOURS AS NEEDED FOR MODERATE PAIN 30 tablet 0  . traZODone (DESYREL) 100 MG tablet TAKE 1 TABLET(100 MG) BY MOUTH AT BEDTIME 30 tablet 0   No current facility-administered medications on file prior to visit.    Allergies:   Allergies  Allergen Reactions  . Sulfa Antibiotics Swelling and Other (See Comments)    Swelling in ankles   . Advil [Ibuprofen] Other (See Comments)    "Dots on chest" (Petechiae)  . Aleve [Naproxen] Other (See Comments)    "Dots on chest" (Petechiae)  . Betadine [Povidone Iodine] Itching and Rash  . Chloroxylenol (Antiseptic) Itching, Rash and Other (See Comments)    PCMX surgical sterilizing scrub  . Poison Ivy Extract Rash  . Povidone-Iodine Itching and Rash    BETADINE      OBJECTIVE:  Vitals  Vitals:   03/19/20 1511  BP: 128/72  Pulse: 67  Temp: (!) 97.4 F (36.3 C)  Weight: 173 lb (78.5 kg)  Height: _0  (1.803 m)   Body mass index is 24.13 kg/m. No exam data present  Depression screen [facility] 2/9 03/19/2020  Decreased Interest 0  Down, Depressed, Hopeless 0  PHQ - 2 Score 0  Altered sleeping -  Tired, decreased energy -  Change in appetite -  Feeling bad or failure about yourself  -  Trouble concentrating -  Moving slowly or fidgety/restless -  Suicidal thoughts -  PHQ-9 Score -  Difficult doing work/chores -     Physical exam:  General: well developed, well nourished,  pleasant elderly Caucasian male, seated, in no evident distress Head: head normocephalic and atraumatic.   Neck: supple with no carotid or supraclavicular bruits Cardiovascular: regular rate and  rhythm, no murmurs Musculoskeletal: no deformity Skin:  no rash/petichiae Vascular:  Normal pulses all extremities  Neurologic Exam Mental Status: Awake and fully alert. mild dysarthria. Oriented to place and time. Recent and remote memory intact. Attention span, concentration and fund of knowledge appropriate. Mood and affect appropriate.  Cranial Nerves: Fundoscopic exam reveals sharp disc margins. Pupils equal, briskly reactive to light. Extraocular movements full without nystagmus. Visual fields full to confrontation. Hearing intact. Facial sensation intact. Mild left lower facial weakness Motor: full strength right upper and lower extremity LUE: 4/5 with 0/5 hand; increased tone and swelling LLE: 5/5 Sensory.: decreased vibratory, light touch and pinprick sensation LUE greater distally otherwise intact Coordination: Rapid alternating movements normal in all extremities except unable left hand. Finger-to-nose performed accurtely on right side and heel-to-shin performed accurately bilaterally. Gait and Station: Arises from chair without difficulty. Stance is normal. Gait demonstrates normal stride length and balance Reflexes: 1+ and symmetric. Toes downgoing.     NIHSS  3 Modified Rankin  2      ASSESSMENT: Ruben Chavez is a 69 y.o. year old male presented with left-sided weakness, left hemisensory loss, left facial droop, left field cut, dysarthria and anosognosia on 01/24/2020 with stroke work-up revealing right MCA infarct due to right MCA occlusion s/p TPA and IR with TICI 2B recanalization -embolic secondary to unknown source therefore loop recorder placed. Vascular risk factors include HTN, HLD and history of EtOH use.  Residual deficits of mild dysarthria, left facial droop and LUE weakness greater distally and likely mild to moderate cognitive communication deficit     PLAN:  1. Left MCA stroke:  -Residual deficits: Ongoing participation in outpatient therapies for  hopeful ongoing improvement -Loop recorder will continue to be monitored for possible atrial fibrillation -Continue aspirin 325 mg daily and clopidogrel 75 mg daily  and atorvastatin 20 mg daily for secondary stroke prevention.  Advised to continue Plavix for additional month and then discontinue as 3 months DAPT completed at that time.   -Maintain strict control of hypertension with blood pressure goal below 130/90, diabetes with hemoglobin A1c goal below 6.5% and cholesterol with LDL cholesterol (bad cholesterol) goal below 70 mg/dL.  I also advised the patient to eat a healthy diet with plenty of whole grains, cereals, fruits and vegetables, exercise regularly with at least 30 minutes of continuous activity daily and maintain ideal body weight. 2. HTN: Stable.  Continue to follow with PCP for monitoring and management 3. HLD: Ongoing use of atorvastatin 20 mg daily for stroke prevention with ongoing prescribing, monitoring management through PCP    Follow up in 3 months or call earlier if needed   I spent 50 minutes of face-to-face and non-face-to-face time with patient.  This included previsit chart review, lab review, study review, order entry, electronic health record documentation, patient education regarding recent stroke, residual deficits, importance of managing stroke risk factors and answered all questions to patient satisfaction     Frann Rider, [facility]  [facility] [address] [place], [place] [address]  Phone [phone] Fax [phone] Note: This document was prepared with digital dictation and possible smart phrase technology. Any transcriptional errors that result from this process are unintentional.

## 2020-03-19 NOTE — Patient Instructions (Addendum)
Continue to work with out patient therapies for ongoing residual deficits  Continue aspirin 325 mg daily and clopidogrel 75 mg daily  and atorvastatin 20mg  daily  for secondary stroke prevention  Continue plavix for additional 1 month and then discontinue - around Mid May  Loop recorder will continued to be monitored for possible atrial fibrillation   Continue to follow up with PCP regarding cholesterol and blood pressure management   Continue to monitor blood pressure at home  Maintain strict control of hypertension with blood pressure goal below 130/90, diabetes with hemoglobin A1c goal below 6.5% and cholesterol with LDL cholesterol (bad cholesterol) goal below 70 mg/dL. I also advised the patient to eat a healthy diet with plenty of whole grains, cereals, fruits and vegetables, exercise regularly and maintain ideal body weight.  Followup in the future with me in 3 months or call earlier if needed       Thank you for coming to see [place] at [facility]. I hope we have been able to provide you high quality care today.  You may receive a patient satisfaction survey over the next few weeks. We would appreciate your feedback and comments so that we may continue to improve ourselves and the health of our patients.

## 2020-03-19 NOTE — Progress Notes (Signed)
I agree with the above plan 

## 2020-03-20 ENCOUNTER — Ambulatory Visit: Payer: PPO | Admitting: Occupational Therapy

## 2020-03-20 ENCOUNTER — Other Ambulatory Visit: Payer: Self-pay

## 2020-03-20 ENCOUNTER — Ambulatory Visit: Payer: PPO | Admitting: Speech Pathology

## 2020-03-20 ENCOUNTER — Ambulatory Visit: Payer: PPO | Admitting: Physical Therapy

## 2020-03-20 DIAGNOSIS — R208 Other disturbances of skin sensation: Secondary | ICD-10-CM

## 2020-03-20 DIAGNOSIS — R278 Other lack of coordination: Secondary | ICD-10-CM

## 2020-03-20 DIAGNOSIS — I69354 Hemiplegia and hemiparesis following cerebral infarction affecting left non-dominant side: Secondary | ICD-10-CM

## 2020-03-20 DIAGNOSIS — R2681 Unsteadiness on feet: Secondary | ICD-10-CM

## 2020-03-20 DIAGNOSIS — R41841 Cognitive communication deficit: Secondary | ICD-10-CM

## 2020-03-20 NOTE — Patient Instructions (Signed)
NMES settings  Pulse width 250, 50 hz, ramp 2, 10 secs on, 10 secs off, (synchonous)- this should be preset Preferred intensity is 7  Place electrodes on forearm as per photo. Connect electrodes to NMES . Gently turn up intensity to desired level with knob. If you need to stop turn off intensity knob. Keep estim away from water.

## 2020-03-20 NOTE — Therapy (Signed)
[facility] [address] [place], [place], [address] Phone: [phone]   Fax:  [phone]  Speech Language Pathology Treatment  Patient Details  Name: Ruben Chavez MRN: [id number] Date of Birth: [birth year] Referring Provider (SLP): Lauraine Rinne, PA-C   Encounter Date: 03/20/2020  End of Session - 03/20/20 1036    Visit Number  4    Number of Visits  17    Date for SLP Re-Evaluation  04/19/20    Authorization Type  one copay for multiple disciplines if same day    SLP Start Time  0939    SLP Stop Time   1015    SLP Time Calculation (min)  36 min    Activity Tolerance  Patient tolerated treatment well       Past Medical History:  Diagnosis Date  . Elevated PSA   . Hyperlipidemia    pt says not anymore  . Hypertension   . Prostate cancer ([place]) 2013    Past Surgical History:  Procedure Laterality Date  . bilateral inguinal hernia  2008  . BUBBLE STUDY  01/28/2020   Procedure: BUBBLE STUDY;  Surgeon: [name], MD;  Location: [facility] ENDOSCOPY;  Service: Cardiovascular;;  . CYST REMOVAL TRUNK  2016   sebaceous cyst  . CYSTOSCOPY  04/22/2016   Procedure: CYSTOSCOPY;  Surgeon: [name], MD;  Location: [facility];  Service: Urology;;  . IR ANGIO VERTEBRAL SEL SUBCLAVIAN INNOMINATE UNI R MOD SED  01/25/2020  . IR CT HEAD LTD  01/25/2020  . IR PERCUTANEOUS ART THROMBECTOMY/INFUSION INTRACRANIAL INC DIAG ANGIO  01/25/2020  . KNEE ARTHROSCOPY  2007   right  . LOOP RECORDER INSERTION N/A 01/28/2020   Procedure: LOOP RECORDER INSERTION;  Surgeon: [name], MD;  Location: [facility] CV [facility];  Service: Cardiovascular;  Laterality: N/A;  . PROSTATE BIOPSY  2008  . PROSTATE BIOPSY  2013  . PROSTATE BIOPSY  01/28/2016  . RADIOACTIVE SEED IMPLANT N/A 04/22/2016   Procedure: RADIOACTIVE SEED IMPLANT/BRACHYTHERAPY IMPLANT;  Surgeon: [name], MD;  Location: [facility];  Service: Urology;  Laterality: N/A;  . RADIOLOGY WITH ANESTHESIA N/A 01/24/2020   Procedure: IR WITH ANESTHESIA;  Surgeon: [name], MD;  Location: [facility];  Service: Radiology;  Laterality: N/A;  . TEE WITHOUT CARDIOVERSION N/A 01/28/2020   Procedure: TRANSESOPHAGEAL ECHOCARDIOGRAM (TEE);  Surgeon: [name], MD;  Location: [facility] ENDOSCOPY;  Service: Cardiovascular;  Laterality: N/A;  . TONSILLECTOMY    . TOTAL HIP ARTHROPLASTY  2001   left  . TOTAL HIP ARTHROPLASTY Right 01/18/2018   Procedure: RIGHT TOTAL HIP ARTHROPLASTY ANTERIOR APPROACH;  Surgeon: [name], MD;  Location: [facility];  Service: Orthopedics;  Laterality: Right;    There were no vitals filed for this visit.  Subjective Assessment - 03/20/20 0942    Subjective  Pt check in at 9:39    Currently in Pain?  No/denies            ADULT SLP TREATMENT - 03/20/20 0943      General Information   Behavior/Cognition  Alert;Cooperative;Pleasant mood;Impulsive;Distractible;Requires cueing;Decreased sustained attention      Treatment Provided   Treatment provided  Cognitive-Linquistic      Pain Assessment   Pain Assessment  No/denies pain      Cognitive-Linquistic Treatment   Treatment focused on  Cognition    Skilled Treatment  Patient did not bring homework ("I thought it was for Glendell Docker, but honestly  I didn't look at it.") SLP told pt that home tasks can be returned to any ST, and that goal of giving him assignments between visits is for him to spend 30 minutes each day doing cognitive exercise. Pt reported "I'm spending all of my time on this (L hand)." Educate re: rationale for cognitive activities daily. SLP targeted attention and simple problem solving with time problems, presented verbally. Pt with 50% accuracy, aware of only 1 error without cues. Patient challenged SLP over one error, and SLP used clock/max A to demonstrate pt's error. Pt stated he should use a strategy such as drawing a clock and  taking his time with subsequent problems, and SLP praised patient for making this observation. Pt receptive to SLP teaching that he is more likely to make errors due to attention/awareness/decreased problem solving skills. Pt gave the example "Dr. Naaman Plummer told me to measure twice, cut once," referencing sending text messages to his family (pt's family not able to understand his texts due to errors). Additional time/money problems assigned for home practice.       Assessment / Recommendations / Plan   Plan  Continue with current plan of care      Progression Toward Goals   Progression toward goals  Progressing toward goals         SLP Short Term Goals - 03/20/20 1038      SLP SHORT TERM GOAL #1   Title  Patient will use compensatory aids/strategies (electronic or paper) with occasional cues from spouse to be on time for appointments x3 sessions    Time  2    Period  Weeks    Status  On-going      SLP SHORT TERM GOAL #2   Title  Patient will complete standardized cognitive testing within the first 3 sessions.    Status  Achieved      SLP SHORT TERM GOAL #3   Title  Patient will demonstrate appropriate topic maintenance over 5 minute conversation with occasional min cues x2 sessions.    Time  2    Period  Weeks    Status  On-going      SLP SHORT TERM GOAL #4   Title  pt will demo selective attention adequate to complete a 5-minute therapy task in 3 sessions    Time  2    Period  Weeks    Status  On-going      SLP SHORT TERM GOAL #5   Title  pt will demo functional organization in a therapy task, with occasional mod A    Time  2    Period  Weeks    Status  On-going       SLP Long Term Goals - 03/20/20 1039      SLP LONG TERM GOAL #1   Title  Patient will use compensatory aids/strategies (electronic or paper) to manage schedule, appointments, and daily tasks with rare min A x 4 sessions.    Time  6    Period  Weeks    Status  On-going      SLP LONG TERM GOAL #2   Title   Pt will ID and correct errors on financial documents with rare min A over 3 sessions    Time  6    Period  Weeks    Status  On-going      SLP LONG TERM GOAL #3   Title  Patient will demonstrate appropriate topic maintenance over 8 minute conversation with modified independence  x 2 sessions.    Time  6    Period  Weeks    Status  On-going      SLP LONG TERM GOAL #4   Title  pt will demo selective attention adequate for completing a mod complex 15-minute task, over three sessions    Time  6    Period  Weeks    Status  On-going       Plan - 03/20/20 1037    Clinical Impression Statement  Mr. Fabio Neighbors presents with what SLP suspects are mild-moderate cognitive communication deficits; pt was 9 minutes late for today's appointment ("It takes me 15-20 minutes to put this glove on, but I only allowed 10") but also told pt his son was late to pick him up. Pt demonstrated reduced awareness/insight into his deficits, however he was responsive to error confrontation and acknowledged deficits with teaching. I recommend cont'd skilled ST to address cognitive communication impairment to maximize safety and independence at home.    Speech Therapy Frequency  2x / week    Duration  --   8 weeks or 17 visits   Treatment/Interventions  Environmental controls;SLP instruction and feedback;Cueing hierarchy;Compensatory techniques;Cognitive reorganization;Functional tasks;Compensatory strategies;Internal/external aids;Multimodal communcation approach;Patient/family education    Potential to Achieve Goals  Good    Consulted and Agree with Plan of Care  Patient;Family member/caregiver       Patient will benefit from skilled therapeutic intervention in order to improve the following deficits and impairments:   Cognitive communication deficit    Problem List Patient Active Problem List   Diagnosis Date Noted  . Chronic bilateral low back pain without sciatica 02/25/2020  . Spasticity as late effect  of cerebrovascular accident (CVA) 02/25/2020  . Cognitive and neurobehavioral dysfunction   . Hypertensive urgency 01/28/2020  . Urinary retention 01/28/2020  . Right middle cerebral artery stroke ([place]) 01/28/2020  . Stroke (cerebrum) ([place]) -  [name] 01/25/2020  . Acute respiratory failure with hypoxia ([place])   . Hypotension   . Hypokalemia   . Hypocalcemia   . Encephalopathy acute   . Stroke ([place]) 01/24/2020  . Middle cerebral artery embolism, right 01/24/2020  . Hyperlipidemia 02/21/2019  . Impingement syndrome of right shoulder region 02/21/2018  . OA (osteoarthritis) of hip 01/18/2018  . Prostate cancer ([place]) 03/04/2016  . Essential hypertension 11/27/2014  . History of colonic polyps 09/26/2014  . BACK PAIN, LEFT 12/22/2009  . PROSTATE SPECIFIC ANTIGEN, ELEVATED 12/20/2008  . LATERAL EPICONDYLITIS 07/15/2008  . ACTINIC KERATOSIS, FOREHEAD, LEFT 05/23/2008  . WART, LEFT HAND 07/19/2007  . ERECTILE DYSFUNCTION, MILD 07/19/2007  . HERNIA, BILATERAL INGUINAL W/O OBST/GANGRENE 07/19/2007  . MENISCUS TEAR 07/19/2007  . Bilateral inguinal hernia 07/19/2007   Deneise Lever, [place], [place] 03/20/2020, 10:39 AM  [facility] [address] [place], [place], [address] Phone: [phone]   Fax:  [phone]   Name: JESSEE MEZERA MRN: [id number] Date of Birth: 03/31/1951

## 2020-03-20 NOTE — Therapy (Signed)
[facility] [address] [place], [place], [address] Phone: [phone]   Fax:  [phone]  Physical Therapy Treatment  Patient Details  Name: Ruben Chavez MRN: [id number] Date of Birth: 02/18/1951 Referring Provider (PT): Lauraine Rinne, PA-C (pt will be following up with Dr. Dagoberto Ligas)   Encounter Date: 03/20/2020  PT End of Session - 03/20/20 1157    Visit Number  3    Number of Visits  17    Date for PT Re-Evaluation  05/15/20   written for 60 day POC   Authorization Type  [employer]    PT Start Time  1016    PT Stop Time  1058    PT Time Calculation (min)  42 min    Equipment Utilized During Treatment  Gait belt    Activity Tolerance  Patient tolerated treatment well    Behavior During Therapy  [facility] for tasks assessed/performed       Past Medical History:  Diagnosis Date  . Elevated PSA   . Hyperlipidemia    pt says not anymore  . Hypertension   . Prostate cancer ([place]) 2013    Past Surgical History:  Procedure Laterality Date  . bilateral inguinal hernia  2008  . BUBBLE STUDY  01/28/2020   Procedure: BUBBLE STUDY;  Surgeon: [name], MD;  Location: [facility] ENDOSCOPY;  Service: Cardiovascular;;  . CYST REMOVAL TRUNK  2016   sebaceous cyst  . CYSTOSCOPY  04/22/2016   Procedure: CYSTOSCOPY;  Surgeon: [name], MD;  Location: [facility];  Service: Urology;;  . IR ANGIO VERTEBRAL SEL SUBCLAVIAN INNOMINATE UNI R MOD SED  01/25/2020  . IR CT HEAD LTD  01/25/2020  . IR PERCUTANEOUS ART THROMBECTOMY/INFUSION INTRACRANIAL INC DIAG ANGIO  01/25/2020  . KNEE ARTHROSCOPY  2007   right  . LOOP RECORDER INSERTION N/A 01/28/2020   Procedure: LOOP RECORDER INSERTION;  Surgeon: [name], MD;  Location: [facility];  Service: Cardiovascular;  Laterality: N/A;  . PROSTATE BIOPSY  2008  . PROSTATE BIOPSY  2013  . PROSTATE BIOPSY  01/28/2016  . RADIOACTIVE SEED IMPLANT  N/A 04/22/2016   Procedure: RADIOACTIVE SEED IMPLANT/BRACHYTHERAPY IMPLANT;  Surgeon: [name], MD;  Location: [facility];  Service: Urology;  Laterality: N/A;  . RADIOLOGY WITH ANESTHESIA N/A 01/24/2020   Procedure: IR WITH ANESTHESIA;  Surgeon: [name], MD;  Location: [facility];  Service: Radiology;  Laterality: N/A;  . TEE WITHOUT CARDIOVERSION N/A 01/28/2020   Procedure: TRANSESOPHAGEAL ECHOCARDIOGRAM (TEE);  Surgeon: [name], MD;  Location: [facility] ENDOSCOPY;  Service: Cardiovascular;  Laterality: N/A;  . TONSILLECTOMY    . TOTAL HIP ARTHROPLASTY  2001   left  . TOTAL HIP ARTHROPLASTY Right 01/18/2018   Procedure: RIGHT TOTAL HIP ARTHROPLASTY ANTERIOR APPROACH;  Surgeon: [name], MD;  Location: [facility];  Service: Orthopedics;  Laterality: Right;    There were no vitals filed for this visit.  Subjective Assessment - 03/20/20 1016    Subjective  Found the corner he was going to work on his eyes closed balance, but has not had the chance to try it.    Patient is accompained by:  Family member   wife katherine   Pertinent History  hyperlipidemia, hypertension, prostate cancer with radioactive seed implant, remote tobacco abuse.    Diagnostic tests  Cranial CT scan showed subacute infarction right lateral temporal lobe    Patient Stated Goals  wants to get  left hand and arm working, wants to improve his balance and changing direction (per wife).    Currently in Pain?  No/denies                       [facility] Adult PT Treatment/Exercise - 03/20/20 0001      Ambulation/Gait   Ambulation/Gait  Yes    Ambulation/Gait Assistance  5: Supervision;4: Min guard    Ambulation/Gait Assistance Details  outdoors over grass had pt perform cognitive challenge naming foods A-Z with no LOB and only minimal decr in gait speed, also had pt perform head motions with no LOB. ambulated up/down steeep incline with min guard while descending    Ambulation  Distance (Feet)  500 Feet    Assistive device  None    Gait Pattern  Step-through pattern;Decreased arm swing - left;Decreased stance time - left;Decreased stride length    Ambulation Surface  Outdoor;Gravel;Paved;Grass      Neuro Re-ed    Neuro Re-ed Details   In corner w/ chair anteriorly for safety. Foam balance: with narrow BOS 4 x 30 seconds, feet together eyes open 1 x 10 reps head turns - 1 x 10 reps head nods, with wider BOS and eyes closed 2 x 10 reps head turns, 2 x 10 reps head nods. On rockerboard 12 reps A/P weight shifting, holding board steady attempting closing eyes x10 seconds - multiple reps, pt needs cue to keep eyes closed as he has tendency to be distracted and open, at one point pt needed mod A for balance when almost losing balance anteriorly. Standing on blue foam beam: alternating SLS taps x6 reps B to cone anteriorly with cues for slowed and controlled, alternating SLS 2 cone cross taps x10 reps B (cues for gentle toe tap vs. resting foot on cone for incr SLS).                PT Short Term Goals - 03/18/20 2109      PT SHORT TERM GOAL #1   Title  Pt will be independent with initial HEP in order to build upon functional gains made in therapy. ALL STGS DUE 04/15/20    Time  4   due to delay in scheduling - 1st return visit 03/18/20   Period  Weeks    Status  New    Target Date  04/15/20      PT SHORT TERM GOAL #2   Title  Pt will improve FGA score to at least a 27/30 in order to indicate decr fall risk.    Baseline  24/30 on 03/18/20    Time  4    Period  Weeks    Status  Revised      PT SHORT TERM GOAL #3   Title  Pt will ambulate at least 300' over level surfaces while scanning environment with supervision and no LOB.    Time  4    Period  Weeks    Status  New      PT SHORT TERM GOAL #4   Title  Pt will decr 5x sit <> stand time to 14 seconds or less in order to indicate improved functional LE strength.    Baseline  16.69 seconds on 02/15/20    Time   4    Period  Weeks    Status  New      PT SHORT TERM GOAL #5   Title  Pt will improve gait speed to at  least 2.9 ft/sec in order to demo improved gait efficiency.    Time  4    Period  Weeks    Status  New        PT Long Term Goals - 03/18/20 2110      PT LONG TERM GOAL #1   Title  Pt will be independent with final HEP in order to build upon functional gains made in therapy. ALL LTGS DUE 05/13/20    Time  8   due to delay in scheduling   Period  Weeks    Status  New    Target Date  05/13/20      PT LONG TERM GOAL #2   Title  Pt will improve FGA score to at least a 29/30 in order to indicate decr fall risk.    Time  4    Period  Weeks    Status  New      PT LONG TERM GOAL #3   Title  30 second chair stand goal to be written as appropriate to demo improved LE strength and endurance.    Time  8    Period  Weeks    Status  New      PT LONG TERM GOAL #4   Title  Pt will improve gait speed to at least 3.4 ft/sec in order to demo improved gait efficiency in the community.    Time  8    Period  Weeks    Status  New      PT LONG TERM GOAL #5   Title  Pt will ambulate at least 500' over unlevel surfaces with mod I and scanning environment in order to improve community mobility.    Time  8    Period  Weeks    Status  New      PT LONG TERM GOAL #6   Title  Pt will perform 12 steps with no handrail and step through pattern with supervision in order to indicate improved functional LE strength.    Time  8    Period  Weeks    Status  New            Plan - 03/20/20 1158    Clinical Impression Statement  Ambulated outdoors over grass surfaces with scanning environment with no LOB and ambulated up/down steep incline with therapist providing min guard when descending hill with no LOB. Pt continues to be most challenged by eyes closed balance, especially on rockerboard -needed one episode of mod A to maintain balance. Pt needs cues to keep eyes closed during activities as pt can  be easily distractable. Will continue to progress towards LTGs.    Personal Factors and Comorbidities  Comorbidity 3+;Past/Current Experience    Comorbidities  hyperlipidemia, hypertension, prostate cancer with radioactive seed implant, remote tobacco abuse.    Examination-Activity Limitations  Stairs;Locomotion Level    Examination-Participation Restrictions  Community Activity;Driving;Yard Work   being on his boat   Stability/Clinical Decision Making  Stable/Uncomplicated    Rehab Potential  Good    PT Frequency  2x / week    PT Duration  8 weeks    PT Treatment/Interventions  ADLs/Self Care Home Management;Therapeutic activities;Functional mobility training;Stair training;Gait training;Therapeutic exercise;Balance training;Neuromuscular re-education;Patient/family education    PT Next Visit Plan  eyes closed balance, gait outdoors over unlevel surfaces, functional LE strengthening, balance strategies (pt has a boat and lake house that he would like to get back to going to)    PT  Home Exercise Plan  [id number]       Patient will benefit from skilled therapeutic intervention in order to improve the following deficits and impairments:  Abnormal gait, Decreased activity tolerance, Decreased balance, Decreased safety awareness, Difficulty walking, Decreased strength, Impaired UE functional use  Visit Diagnosis: Other disturbances of skin sensation  Hemiplegia and hemiparesis following cerebral infarction affecting left non-dominant side (HCC)  Other lack of coordination  Unsteadiness on feet     Problem List Patient Active Problem List   Diagnosis Date Noted  . Chronic bilateral low back pain without sciatica 02/25/2020  . Spasticity as late effect of cerebrovascular accident (CVA) 02/25/2020  . Cognitive and neurobehavioral dysfunction   . Hypertensive urgency 01/28/2020  . Urinary retention 01/28/2020  . Right middle cerebral artery stroke ([place]) 01/28/2020  . Stroke (cerebrum)  ([place]) -  [name] 01/25/2020  . Acute respiratory failure with hypoxia ([place])   . Hypotension   . Hypokalemia   . Hypocalcemia   . Encephalopathy acute   . Stroke ([place]) 01/24/2020  . Middle cerebral artery embolism, right 01/24/2020  . Hyperlipidemia 02/21/2019  . Impingement syndrome of right shoulder region 02/21/2018  . OA (osteoarthritis) of hip 01/18/2018  . Prostate cancer ([place]) 03/04/2016  . Essential hypertension 11/27/2014  . History of colonic polyps 09/26/2014  . BACK PAIN, LEFT 12/22/2009  . PROSTATE SPECIFIC ANTIGEN, ELEVATED 12/20/2008  . LATERAL EPICONDYLITIS 07/15/2008  . ACTINIC KERATOSIS, FOREHEAD, LEFT 05/23/2008  . WART, LEFT HAND 07/19/2007  . ERECTILE DYSFUNCTION, MILD 07/19/2007  . HERNIA, BILATERAL INGUINAL W/O OBST/GANGRENE 07/19/2007  . MENISCUS TEAR 07/19/2007  . Bilateral inguinal hernia 07/19/2007    Arliss Journey, PT, DPT  03/20/2020, 12:03 PM  [facility] [address] [place], [place], [address] Phone: [phone]   Fax:  [phone]  Name: JANDEL PATRIARCA MRN: [id number] Date of Birth: Aug 27, 1951

## 2020-03-20 NOTE — Therapy (Signed)
[employer] [address] [place], [place], [address] Phone: [phone]   Fax:  [phone]  Occupational Therapy Treatment  Patient Details  Name: Ruben Chavez MRN: [id number] Date of Birth: 17-Mar-1951 Referring Provider (OT): Lauraine Rinne, PA-C   Encounter Date: 03/20/2020  OT End of Session - 03/20/20 1544    Visit Number  7    Number of Visits  17    Date for OT Re-Evaluation  04/22/20    Authorization Type  [employer] (follow Medicare)--1 copay for multiple visits    Authorization Time Period  cert date 9/32/67-12/29/56    Authorization - Visit Number  7    Authorization - Number of Visits  10    Progress Note Due on Visit  10    OT Start Time  1105    OT Stop Time  1145    OT Time Calculation (min)  40 min    Activity Tolerance  Patient tolerated treatment well    Behavior During Therapy  [facility] for tasks assessed/performed       Past Medical History:  Diagnosis Date  . Elevated PSA   . Hyperlipidemia    pt says not anymore  . Hypertension   . Prostate cancer ([place]) 2013    Past Surgical History:  Procedure Laterality Date  . bilateral inguinal hernia  2008  . BUBBLE STUDY  01/28/2020   Procedure: BUBBLE STUDY;  Surgeon: [name], MD;  Location: [facility] ENDOSCOPY;  Service: Cardiovascular;;  . CYST REMOVAL TRUNK  2016   sebaceous cyst  . CYSTOSCOPY  04/22/2016   Procedure: CYSTOSCOPY;  Surgeon: [name], MD;  Location: [facility];  Service: Urology;;  . IR ANGIO VERTEBRAL SEL SUBCLAVIAN INNOMINATE UNI R MOD SED  01/25/2020  . IR CT HEAD LTD  01/25/2020  . IR PERCUTANEOUS ART THROMBECTOMY/INFUSION INTRACRANIAL INC DIAG ANGIO  01/25/2020  . KNEE ARTHROSCOPY  2007   right  . LOOP RECORDER INSERTION N/A 01/28/2020   Procedure: LOOP RECORDER INSERTION;  Surgeon: [name], MD;  Location: [place] [facility] CV LAB;  Service: Cardiovascular;  Laterality: N/A;  . PROSTATE BIOPSY   2008  . PROSTATE BIOPSY  2013  . PROSTATE BIOPSY  01/28/2016  . RADIOACTIVE SEED IMPLANT N/A 04/22/2016   Procedure: RADIOACTIVE SEED IMPLANT/BRACHYTHERAPY IMPLANT;  Surgeon: [name], MD;  Location: [facility];  Service: Urology;  Laterality: N/A;  . RADIOLOGY WITH ANESTHESIA N/A 01/24/2020   Procedure: IR WITH ANESTHESIA;  Surgeon: [name], MD;  Location: [place];  Service: Radiology;  Laterality: N/A;  . TEE WITHOUT CARDIOVERSION N/A 01/28/2020   Procedure: TRANSESOPHAGEAL ECHOCARDIOGRAM (TEE);  Surgeon: [name], MD;  Location: [facility] ENDOSCOPY;  Service: Cardiovascular;  Laterality: N/A;  . TONSILLECTOMY    . TOTAL HIP ARTHROPLASTY  2001   left  . TOTAL HIP ARTHROPLASTY Right 01/18/2018   Procedure: RIGHT TOTAL HIP ARTHROPLASTY ANTERIOR APPROACH;  Surgeon: [name], MD;  Location: [facility];  Service: Orthopedics;  Laterality: Right;    There were no vitals filed for this visit.  Subjective Assessment - 03/20/20 1542    Subjective   Denies pain    Pertinent History  PMH:  hyperlipidemia, hypertension, prostate cancer radioactive seed implant 2017, R total hip arthroplasty, loop recorder insertion    Currently in Pain?  No/denies               Treatment: Retrograde massage to LUE followed by gentle passive ROM  finger flexion extension. NMES applied to wrist and finger extensors x 15 mins 250 pw, 50 pps, 10 secs cycle intensity 7. No adverse reactions with pt gently supporting hand to facilitate wrist flexion and to prevent wrist from dropping quickly during off cycle.  Therapist provided education regarding parameters, and application of electrodes. Pt was instructed to use initially only  with his wife's assistance.  Therapist called pt's wife and left a message regarding pt's need for assistance with estim use and that  if she did not feel comfortable with assisting pt, then pt's son should come in for education next visit and they should  not use until further education was completed..            OT Education - 03/20/20 1213    Education Details  Reveiwed application of home NMES unit, settings are preset except pt should only have to adjust intensity . Pt was instructed to have his wife assist him with use initally. parameters were written down and picture taken of electrode placement.    Person(s) Educated  Patient    Methods  Explanation;Demonstration;Handout    Comprehension  Verbal cues required;Verbalized understanding       OT Short Term Goals - 02/25/20 1218      OT SHORT TERM GOAL #1   Title  Pt will be independent with initial HEP--check STG 03/24/20    Time  4    Period  Weeks    Status  On-going      OT SHORT TERM GOAL #2   Title  Pt will verbalize understanding of proper positioning of LUE to prevent pain/injury and edema management techniques.    Time  4    Period  Weeks    Status  On-going      OT SHORT TERM GOAL #3   Title  Pt will be independent with splint wear/care for improved positioning.    Time  4    Period  Weeks    Status  On-going      OT SHORT TERM GOAL #4   Title  Pt will be able to use LUE as a stabilizer for simple ADL tasks.    Time  4    Period  Weeks    Status  New      OT SHORT TERM GOAL #5   Title  Pt will demo at least 25% gross finger flex in prep for functional grasp.    Baseline  4    Time  4    Period  Weeks    Status  New        OT Long Term Goals - 03/14/20 1209      OT LONG TERM GOAL #1   Title  Pt will be independent with updated HEP.--check LTGs 04/23/20    Time  8    Period  Weeks    Status  New      OT LONG TERM GOAL #2   Title  Pt will be able to use LUE as nondominant assist for ADLs at least 50% of the time.    Time  8    Period  Weeks    Status  New      OT LONG TERM GOAL #3   Title  Pt will be able to score at least 10 on box and blocks test with LUE for improved coordination for ADLs.    Time  8    Period  Weeks    Status  New  OT LONG TERM GOAL #4   Title  Pt will complete clothing fasteners mod I using AE prn.    Time  8    Period  Weeks    Status  New      OT LONG TERM GOAL #5   Title  Pt will demo improved attention/visual scanning to be able to perform environmental scanning in busy enivornment with at least 95% accuracy for incr safety for community activities/driving    Baseline  03/14/20;  58% initially    Time  8    Period  Weeks    Status  New      OT LONG TERM GOAL #6   Title  Pt will demo at least 75% gross finger flex/ext for grasp/release of objects.    Time  8    Period  Weeks    Status  New            Plan - 03/20/20 1548    Clinical Impression Statement  Pt with moderate edema in LUE when edema glove and splint were removed. Retrograde massage was performed with good reduction in edema. Pt was instructed in NMES use. Handout provided and picture was taken of electrode placement. Pt was instructed to use initally with his wife's assistance.    Occupational performance deficits (Please refer to evaluation for details):  ADL's;IADL's;Leisure;Social Participation;Work    Marketing executive / Function / Physical Skills  ADL;Dexterity;ROM;IADL;Sensation;Mobility;Strength;FMC;Coordination;Decreased knowledge of precautions;Decreased knowledge of use of DME;Pain;UE functional use;GMC;Proprioception    Cognitive Skills  Attention;Memory;Perception;Safety Awareness    Rehab Potential  Fair    OT Frequency  2x / week    OT Duration  8 weeks    OT Treatment/Interventions  Self-care/ADL training;Moist Heat;Fluidtherapy;DME and/or AE instruction;Splinting;Therapeutic activities;Contrast Bath;Aquatic Therapy;Ultrasound;Therapeutic exercise;Cognitive remediation/compensation;Visual/perceptual remediation/compensation;Passive range of motion;Functional Mobility Training;Neuromuscular education;Cryotherapy;Electrical Stimulation;Paraffin;Energy conservation;Manual Therapy;Patient/family education    Plan   continue to reinforce correct use of NMES, make video of use if no family available, educate family if they are able to come in for education,continue with neuro re-ed LUE, environmental scanning again (pt request)    Consulted and Agree with Plan of Care  Patient;Family member/caregiver    Family Member Consulted  wife       Patient will benefit from skilled therapeutic intervention in order to improve the following deficits and impairments:   Body Structure / Function / Physical Skills: ADL, Dexterity, ROM, IADL, Sensation, Mobility, Strength, FMC, Coordination, Decreased knowledge of precautions, Decreased knowledge of use of DME, Pain, UE functional use, GMC, Proprioception Cognitive Skills: Attention, Memory, Perception, Safety Awareness     Visit Diagnosis: Other disturbances of skin sensation  Hemiplegia and hemiparesis following cerebral infarction affecting left non-dominant side (HCC)  Other lack of coordination    Problem List Patient Active Problem List   Diagnosis Date Noted  . Chronic bilateral low back pain without sciatica 02/25/2020  . Spasticity as late effect of cerebrovascular accident (CVA) 02/25/2020  . Cognitive and neurobehavioral dysfunction   . Hypertensive urgency 01/28/2020  . Urinary retention 01/28/2020  . Right middle cerebral artery stroke ([place]) 01/28/2020  . Stroke (cerebrum) ([place]) -  [name] 01/25/2020  . Acute respiratory failure with hypoxia ([place])   . Hypotension   . Hypokalemia   . Hypocalcemia   . Encephalopathy acute   . Stroke ([place]) 01/24/2020  . Middle cerebral artery embolism, right 01/24/2020  .  Hyperlipidemia 02/21/2019  . Impingement syndrome of right shoulder region 02/21/2018  . OA (osteoarthritis) of hip 01/18/2018  . Prostate cancer ([place]) 03/04/2016  . Essential hypertension 11/27/2014  . History of colonic polyps  09/26/2014  . BACK PAIN, LEFT 12/22/2009  . PROSTATE SPECIFIC ANTIGEN, ELEVATED 12/20/2008  . LATERAL EPICONDYLITIS 07/15/2008  . ACTINIC KERATOSIS, FOREHEAD, LEFT 05/23/2008  . WART, LEFT HAND 07/19/2007  . ERECTILE DYSFUNCTION, MILD 07/19/2007  . HERNIA, BILATERAL INGUINAL W/O OBST/GANGRENE 07/19/2007  . MENISCUS TEAR 07/19/2007  . Bilateral inguinal hernia 07/19/2007    Raziya Aveni 03/20/2020, 4:13 PM Theone Murdoch, OTR/L [phone] Phone: [phone] 4:18 PM 03/20/20 [facility] [address] [place], [place], [address] Phone: [phone]   Fax:  [phone]  Name: Ruben Chavez MRN: [id number] Date of Birth: 1951-07-11

## 2020-03-24 ENCOUNTER — Ambulatory Visit: Payer: PPO | Admitting: Occupational Therapy

## 2020-03-24 ENCOUNTER — Encounter: Payer: Self-pay | Admitting: Physical Therapy

## 2020-03-24 ENCOUNTER — Ambulatory Visit: Payer: PPO | Admitting: Physical Therapy

## 2020-03-24 ENCOUNTER — Ambulatory Visit: Payer: PPO

## 2020-03-24 ENCOUNTER — Other Ambulatory Visit: Payer: Self-pay

## 2020-03-24 DIAGNOSIS — R278 Other lack of coordination: Secondary | ICD-10-CM

## 2020-03-24 DIAGNOSIS — R208 Other disturbances of skin sensation: Secondary | ICD-10-CM

## 2020-03-24 DIAGNOSIS — I69354 Hemiplegia and hemiparesis following cerebral infarction affecting left non-dominant side: Secondary | ICD-10-CM

## 2020-03-24 DIAGNOSIS — R41841 Cognitive communication deficit: Secondary | ICD-10-CM

## 2020-03-24 DIAGNOSIS — R2681 Unsteadiness on feet: Secondary | ICD-10-CM

## 2020-03-24 DIAGNOSIS — R471 Dysarthria and anarthria: Secondary | ICD-10-CM

## 2020-03-24 DIAGNOSIS — R41840 Attention and concentration deficit: Secondary | ICD-10-CM

## 2020-03-24 NOTE — Therapy (Signed)
[facility] [address] [place], [place], [address] Phone: [phone]   Fax:  [phone]  Speech Language Pathology Treatment  Patient Details  Name: Ruben Chavez MRN: [id number] Date of Birth: 01-Sep-1951 Referring Provider (SLP): Lauraine Rinne, PA-C   Encounter Date: 03/24/2020  End of Session - 03/24/20 1742    Visit Number  5    Number of Visits  17    Date for SLP Re-Evaluation  04/19/20    Authorization Type  one copay for multiple disciplines if same day    SLP Start Time  1107    SLP Stop Time   1146    SLP Time Calculation (min)  39 min    Activity Tolerance  Patient tolerated treatment well       Past Medical History:  Diagnosis Date  . Elevated PSA   . Hyperlipidemia    pt says not anymore  . Hypertension   . Prostate cancer ([place]) 2013    Past Surgical History:  Procedure Laterality Date  . bilateral inguinal hernia  2008  . BUBBLE STUDY  01/28/2020   Procedure: BUBBLE STUDY;  Surgeon: [name], MD;  Location: [facility] ENDOSCOPY;  Service: Cardiovascular;;  . CYST REMOVAL TRUNK  2016   sebaceous cyst  . CYSTOSCOPY  04/22/2016   Procedure: CYSTOSCOPY;  Surgeon: [name], MD;  Location: [facility];  Service: Urology;;  . IR ANGIO VERTEBRAL SEL SUBCLAVIAN INNOMINATE UNI R MOD SED  01/25/2020  . IR CT HEAD LTD  01/25/2020  . IR PERCUTANEOUS ART THROMBECTOMY/INFUSION INTRACRANIAL INC DIAG ANGIO  01/25/2020  . KNEE ARTHROSCOPY  2007   right  . LOOP RECORDER INSERTION N/A 01/28/2020   Procedure: LOOP RECORDER INSERTION;  Surgeon: [name], MD;  Location: [place] CV LAB;  Service: Cardiovascular;  Laterality: N/A;  . PROSTATE BIOPSY  2008  . PROSTATE BIOPSY  2013  . PROSTATE BIOPSY  01/28/2016  . RADIOACTIVE SEED IMPLANT N/A 04/22/2016   Procedure: RADIOACTIVE SEED IMPLANT/BRACHYTHERAPY IMPLANT;  Surgeon: [name], MD;  Location: [facility];  Service: Urology;  Laterality: N/A;  . RADIOLOGY WITH ANESTHESIA N/A 01/24/2020   Procedure: IR WITH ANESTHESIA;  Surgeon: [name], MD;  Location: [facility];  Service: Radiology;  Laterality: N/A;  . TEE WITHOUT CARDIOVERSION N/A 01/28/2020   Procedure: TRANSESOPHAGEAL ECHOCARDIOGRAM (TEE);  Surgeon: [name], MD;  Location[facility] ENDOSCOPY;  Service: Cardiovascular;  Laterality: N/A;  . TONSILLECTOMY    . TOTAL HIP ARTHROPLASTY  2001   left  . TOTAL HIP ARTHROPLASTY Right 01/18/2018   Procedure: RIGHT TOTAL HIP ARTHROPLASTY ANTERIOR APPROACH;  Surgeon: [name], MD;  Location: [facility];  Service: Orthopedics;  Laterality: Right;    There were no vitals filed for this visit.  Subjective Assessment - 03/24/20 1141    Subjective  Pt went in to ST room instead of sitting down outside ST room, as directed by OT.    Currently in Pain?  No/denies            ADULT SLP TREATMENT - 03/24/20 1143      General Information   Behavior/Cognition  Alert;Cooperative;Pleasant mood;Impulsive;Distractible;Requires cueing;Decreased sustained attention      Treatment Provided   Treatment provided  Cognitive-Linquistic      Pain Assessment   Pain Assessment  No/denies pain      Cognitive-Linquistic Treatment   Treatment focused on  Cognition    Skilled Treatment  "My  short term memory is getting better in tht I'm able to focus and think about it more." Pt got out his homework but demonstrated confusion on what was due today and what was due previous session- SLP had to provide total A. Pt did not think to write due date on homework for next session (to correct one paper from today's homework). With detailed tasks using reasoning pt req'd extra time consistently with occaional min A. When asked to find what day pt arrives next for ST (on his therapy calendar) he req'd min A for accuracy, without emergent awareness of his error.      Assessment / Recommendations / Plan   Plan   Continue with current plan of care      Progression Toward Goals   Progression toward goals  Progressing toward goals   intellectual awareness improving; large attention component      SLP Education - 03/24/20 1742    Education Details  deficit areas - attention    Person(s) Educated  Patient    Methods  Explanation    Comprehension  Verbalized understanding;Need further instruction       SLP Short Term Goals - 03/24/20 1743      SLP SHORT TERM GOAL #1   Title  Patient will use compensatory aids/strategies (electronic or paper) with occasional cues from spouse to be on time for appointments x3 sessions    Baseline  03-24-20    Time  1    Period  Weeks    Status  On-going      SLP SHORT TERM GOAL #2   Title  Patient will complete standardized cognitive testing within the first 3 sessions.    Status  Achieved      SLP SHORT TERM GOAL #3   Title  Patient will demonstrate appropriate topic maintenance over 5 minute conversation with occasional min cues x2 sessions.    Time  1    Period  Weeks    Status  On-going      SLP SHORT TERM GOAL #4   Title  pt will demo selective attention adequate to complete a 5-minute therapy task in 3 sessions    Baseline  03-24-20    Time  1    Period  Weeks    Status  On-going      SLP SHORT TERM GOAL #5   Title  pt will demo functional organization in a therapy task, with occasional mod A    Time  1    Period  Weeks    Status  On-going       SLP Long Term Goals - 03/24/20 1744      SLP LONG TERM GOAL #1   Title  Patient will use compensatory aids/strategies (electronic or paper) to manage schedule, appointments, and daily tasks with rare min A x 4 sessions.    Time  5    Period  Weeks    Status  On-going      SLP LONG TERM GOAL #2   Title  Pt will ID and correct errors on financial documents with rare min A over 3 sessions    Time  5    Period  Weeks    Status  On-going      SLP LONG TERM GOAL #3   Title  Patient will  demonstrate appropriate topic maintenance over 8 minute conversation with modified independence x 2 sessions.    Time  5    Period  Weeks  Status  On-going      SLP LONG TERM GOAL #4   Title  pt will demo selective attention adequate for completing a mod complex 15-minute task, over three sessions    Time  5    Period  Weeks    Status  On-going       Plan - 03/24/20 1742    Clinical Impression Statement  Ruben Chavez presents with functionally moderate cognitive communication deficits. Pt demonstrated reduced awareness/insight into his deficits, however this is improving. I recommend cont'd skilled ST to address cognitive communication impairment to maximize safety and independence at home.    Speech Therapy Frequency  2x / week    Duration  --   8 weeks or 17 visits   Treatment/Interventions  Environmental controls;SLP instruction and feedback;Cueing hierarchy;Compensatory techniques;Cognitive reorganization;Functional tasks;Compensatory strategies;Internal/external aids;Multimodal communcation approach;Patient/family education    Potential to Achieve Goals  Good    Consulted and Agree with Plan of Care  Patient;Family member/caregiver       Patient will benefit from skilled therapeutic intervention in order to improve the following deficits and impairments:   Cognitive communication deficit  Dysarthria and anarthria    Problem List Patient Active Problem List   Diagnosis Date Noted  . Chronic bilateral low back pain without sciatica 02/25/2020  . Spasticity as late effect of cerebrovascular accident (CVA) 02/25/2020  . Cognitive and neurobehavioral dysfunction   . Hypertensive urgency 01/28/2020  . Urinary retention 01/28/2020  . Right middle cerebral artery stroke ([place]) 01/28/2020  . Stroke (cerebrum) ([place]) -  [name] 01/25/2020  . Acute respiratory failure  with hypoxia ([place])   . Hypotension   . Hypokalemia   . Hypocalcemia   . Encephalopathy acute   . Stroke ([place]) 01/24/2020  . Middle cerebral artery embolism, right 01/24/2020  . Hyperlipidemia 02/21/2019  . Impingement syndrome of right shoulder region 02/21/2018  . OA (osteoarthritis) of hip 01/18/2018  . Prostate cancer ([place]) 03/04/2016  . Essential hypertension 11/27/2014  . History of colonic polyps 09/26/2014  . BACK PAIN, LEFT 12/22/2009  . PROSTATE SPECIFIC ANTIGEN, ELEVATED 12/20/2008  . LATERAL EPICONDYLITIS 07/15/2008  . ACTINIC KERATOSIS, FOREHEAD, LEFT 05/23/2008  . WART, LEFT HAND 07/19/2007  . ERECTILE DYSFUNCTION, MILD 07/19/2007  . HERNIA, BILATERAL INGUINAL W/O OBST/GANGRENE 07/19/2007  . MENISCUS TEAR 07/19/2007  . Bilateral inguinal hernia 07/19/2007    [facility] ,MS, CCC-SLP  03/24/2020, 5:45 PM  [facility] [address] [place], [place], [address] Phone: [phone]   Fax:  [phone]   Name: Ruben Chavez MRN: [id number] Date of Birth: July 26, 1951

## 2020-03-24 NOTE — Therapy (Signed)
[facility] [address] [place], [place], [address] Phone: [phone]   Fax:  [phone]  Occupational Therapy Treatment  Patient Details  Name: Ruben Chavez MRN: [id number] Date of Birth: 17-Mar-1951 Referring Provider (OT): Lauraine Rinne, PA-C   Encounter Date: 03/24/2020  OT End of Session - 03/24/20 1113    Visit Number  8    Number of Visits  17    Date for OT Re-Evaluation  04/22/20    Authorization Type  [employer] (follow Medicare)--1 copay for multiple visits    Authorization Time Period  cert date 0/73/71-0/62/69    Authorization - Visit Number  8    Authorization - Number of Visits  10    Progress Note Due on Visit  10    OT Start Time  1015    OT Stop Time  1100    OT Time Calculation (min)  45 min    Activity Tolerance  Patient tolerated treatment well    Behavior During Therapy  [facility] for tasks assessed/performed       Past Medical History:  Diagnosis Date  . Elevated PSA   . Hyperlipidemia    pt says not anymore  . Hypertension   . Prostate cancer ([facility]) 2013    Past Surgical History:  Procedure Laterality Date  . bilateral inguinal hernia  2008  . BUBBLE STUDY  01/28/2020   Procedure: BUBBLE STUDY;  Surgeon: [name], MD;  Location: [facility] ENDOSCOPY;  Service: Cardiovascular;;  . CYST REMOVAL TRUNK  2016   sebaceous cyst  . CYSTOSCOPY  04/22/2016   Procedure: CYSTOSCOPY;  Surgeon: [name], MD;  Location: [facility];  Service: Urology;;  . IR ANGIO VERTEBRAL SEL SUBCLAVIAN INNOMINATE UNI R MOD SED  01/25/2020  . IR CT HEAD LTD  01/25/2020  . IR PERCUTANEOUS ART THROMBECTOMY/INFUSION INTRACRANIAL INC DIAG ANGIO  01/25/2020  . KNEE ARTHROSCOPY  2007   right  . LOOP RECORDER INSERTION N/A 01/28/2020   Procedure: LOOP RECORDER INSERTION;  Surgeon: [name], MD;  Location: [facility] CV LAB;  Service: Cardiovascular;  Laterality: N/A;  . PROSTATE BIOPSY   2008  . PROSTATE BIOPSY  2013  . PROSTATE BIOPSY  01/28/2016  . RADIOACTIVE SEED IMPLANT N/A 04/22/2016   Procedure: RADIOACTIVE SEED IMPLANT/BRACHYTHERAPY IMPLANT;  Surgeon: [name], MD;  Location: [facility];  Service: Urology;  Laterality: N/A;  . RADIOLOGY WITH ANESTHESIA N/A 01/24/2020   Procedure: IR WITH ANESTHESIA;  Surgeon: [name], MD;  Location: [facility];  Service: Radiology;  Laterality: N/A;  . TEE WITHOUT CARDIOVERSION N/A 01/28/2020   Procedure: TRANSESOPHAGEAL ECHOCARDIOGRAM (TEE);  Surgeon: [name], MD;  Location: [facility] ENDOSCOPY;  Service: Cardiovascular;  Laterality: N/A;  . TONSILLECTOMY    . TOTAL HIP ARTHROPLASTY  2001   left  . TOTAL HIP ARTHROPLASTY Right 01/18/2018   Procedure: RIGHT TOTAL HIP ARTHROPLASTY ANTERIOR APPROACH;  Surgeon: [name], MD;  Location: [facility];  Service: Orthopedics;  Laterality: Right;    There were no vitals filed for this visit.  Subjective Assessment - 03/24/20 1112    Subjective   One time we got the estim to work here but the intensity was too strong    Pertinent History  PMH:  hyperlipidemia, hypertension, prostate cancer radioactive seed implant 2017, R total hip arthroplasty, loop recorder insertion    Limitations  loop recorder    Patient Stated Goals  get my L hand and  fingers working    Currently in Pain?  No/denies       Pt here with no family for further education on home estim device. Videotaped proper use of NMES including electrode placement and precautions (only increase intensity during "on" cycle and always turn machine completely off to remove or change position of electrodes). Parameters already set at previous session. Reviewed safety with this.  NMES x 10 min to wrist and finger extensors while reviewing electrode placement, wear and care, and precautions. Pt not getting as good as response with contraction today even at highest intensity (level 8) - may need new  electrodes  Worked on functional reaching with focus on shoulder, elbow, and forearm control and neutral rotation in prep for grasp/release - pt practiced grasping for cones on shelf with max assist for fingers. Also worked on Designer, jewellery w/ attempts at squeezing slightly deflated ball Environmental scanning in min distracting environment finding 12/12 items on first pass.                       OT Short Term Goals - 02/25/20 1218      OT SHORT TERM GOAL #1   Title  Pt will be independent with initial HEP--check STG 03/24/20    Time  4    Period  Weeks    Status  On-going      OT SHORT TERM GOAL #2   Title  Pt will verbalize understanding of proper positioning of LUE to prevent pain/injury and edema management techniques.    Time  4    Period  Weeks    Status  On-going      OT SHORT TERM GOAL #3   Title  Pt will be independent with splint wear/care for improved positioning.    Time  4    Period  Weeks    Status  On-going      OT SHORT TERM GOAL #4   Title  Pt will be able to use LUE as a stabilizer for simple ADL tasks.    Time  4    Period  Weeks    Status  New      OT SHORT TERM GOAL #5   Title  Pt will demo at least 25% gross finger flex in prep for functional grasp.    Baseline  4    Time  4    Period  Weeks    Status  New        OT Long Term Goals - 03/14/20 1209      OT LONG TERM GOAL #1   Title  Pt will be independent with updated HEP.--check LTGs 04/23/20    Time  8    Period  Weeks    Status  New      OT LONG TERM GOAL #2   Title  Pt will be able to use LUE as nondominant assist for ADLs at least 50% of the time.    Time  8    Period  Weeks    Status  New      OT LONG TERM GOAL #3   Title  Pt will be able to score at least 10 on box and blocks test with LUE for improved coordination for ADLs.    Time  8    Period  Weeks    Status  New      OT LONG TERM GOAL #4   Title  Pt will complete clothing fasteners  mod I using  AE prn.    Time  8    Period  Weeks    Status  New      OT LONG TERM GOAL #5   Title  Pt will demo improved attention/visual scanning to be able to perform environmental scanning in busy enivornment with at least 95% accuracy for incr safety for community activities/driving    Baseline  03/14/20;  58% initially    Time  8    Period  Weeks    Status  New      OT LONG TERM GOAL #6   Title  Pt will demo at least 75% gross finger flex/ext for grasp/release of objects.    Time  8    Period  Weeks    Status  New            Plan - 03/24/20 1115    Clinical Impression Statement  Pt with slightly increased understanding/awareness of NMES (video provided)    Occupational performance deficits (Please refer to evaluation for details):  ADL's;IADL's;Leisure;Social Participation;Work    Marketing executive / Function / Physical Skills  ADL;Dexterity;ROM;IADL;Sensation;Mobility;Strength;FMC;Coordination;Decreased knowledge of precautions;Decreased knowledge of use of DME;Pain;UE functional use;GMC;Proprioception    Cognitive Skills  Attention;Memory;Perception;Safety Awareness    Rehab Potential  Fair    OT Frequency  2x / week    OT Duration  8 weeks    OT Treatment/Interventions  Self-care/ADL training;Moist Heat;Fluidtherapy;DME and/or AE instruction;Splinting;Therapeutic activities;Contrast Bath;Aquatic Therapy;Ultrasound;Therapeutic exercise;Cognitive remediation/compensation;Visual/perceptual remediation/compensation;Passive range of motion;Functional Mobility Training;Neuromuscular education;Cryotherapy;Electrical Stimulation;Paraffin;Energy conservation;Manual Therapy;Patient/family education    Plan  continue neuro re-education, use of NMES (try finger flexors next session - only in clinic)    Consulted and Agree with Plan of Care  Patient       Patient will benefit from skilled therapeutic intervention in order to improve the following deficits and impairments:   Body Structure / Function  / Physical Skills: ADL, Dexterity, ROM, IADL, Sensation, Mobility, Strength, FMC, Coordination, Decreased knowledge of precautions, Decreased knowledge of use of DME, Pain, UE functional use, GMC, Proprioception Cognitive Skills: Attention, Memory, Perception, Safety Awareness     Visit Diagnosis: Hemiplegia and hemiparesis following cerebral infarction affecting left non-dominant side (HCC)  Other disturbances of skin sensation  Attention and concentration deficit    Problem List Patient Active Problem List   Diagnosis Date Noted  . Chronic bilateral low back pain without sciatica 02/25/2020  . Spasticity as late effect of cerebrovascular accident (CVA) 02/25/2020  . Cognitive and neurobehavioral dysfunction   . Hypertensive urgency 01/28/2020  . Urinary retention 01/28/2020  . Right middle cerebral artery stroke ([place]) 01/28/2020  . Stroke (cerebrum) ([place]) -  [name] 01/25/2020  . Acute respiratory failure with hypoxia ([place])   . Hypotension   . Hypokalemia   . Hypocalcemia   . Encephalopathy acute   . Stroke ([place]) 01/24/2020  . Middle cerebral artery embolism, right 01/24/2020  . Hyperlipidemia 02/21/2019  . Impingement syndrome of right shoulder region 02/21/2018  . OA (osteoarthritis) of hip 01/18/2018  . Prostate cancer ([place]) 03/04/2016  . Essential hypertension 11/27/2014  . History of colonic polyps 09/26/2014  . BACK PAIN, LEFT 12/22/2009  . PROSTATE SPECIFIC ANTIGEN, ELEVATED 12/20/2008  . LATERAL EPICONDYLITIS 07/15/2008  . ACTINIC KERATOSIS, FOREHEAD, LEFT 05/23/2008  . WART, LEFT HAND 07/19/2007  . ERECTILE DYSFUNCTION, MILD 07/19/2007  . HERNIA, BILATERAL INGUINAL W/O OBST/GANGRENE  07/19/2007  . MENISCUS TEAR 07/19/2007  . Bilateral inguinal hernia 07/19/2007    Carey Bullocks, OTR/L 03/24/2020, 11:18 AM  [facility] [address] [place], [place], [address] Phone: [phone]   Fax:  [phone]  Name: Ruben Chavez MRN: [id number] Date of Birth: 11-Jul-1951

## 2020-03-24 NOTE — Therapy (Signed)
[facility] [address] [place], [place], [address] Phone: [phone]   Fax:  [phone]  Physical Therapy Treatment  Patient Details  Name: Ruben Chavez MRN: [id number] Date of Birth: 1951-05-18 Referring Provider (PT): Lauraine Rinne, PA-C (pt will be following up with Dr. Dagoberto Ligas)   Encounter Date: 03/24/2020  PT End of Session - 03/24/20 1240    Visit Number  4    Number of Visits  17    Date for PT Re-Evaluation  05/15/20   written for 60 day POC   Authorization Type  [employer]    PT Start Time  [phone]   pt arrived late   PT Stop Time  1016    PT Time Calculation (min)  39 min    Equipment Utilized During Treatment  Gait belt    Activity Tolerance  Patient tolerated treatment well    Behavior During Therapy  [facility] for tasks assessed/performed       Past Medical History:  Diagnosis Date  . Elevated PSA   . Hyperlipidemia    pt says not anymore  . Hypertension   . Prostate cancer ([place]) 2013    Past Surgical History:  Procedure Laterality Date  . bilateral inguinal hernia  2008  . BUBBLE STUDY  01/28/2020   Procedure: BUBBLE STUDY;  Surgeon: [name], MD;  Location: [facility] ENDOSCOPY;  Service: Cardiovascular;;  . CYST REMOVAL TRUNK  2016   sebaceous cyst  . CYSTOSCOPY  04/22/2016   Procedure: CYSTOSCOPY;  Surgeon: [name], MD;  Location: [facility];  Service: Urology;;  . IR ANGIO VERTEBRAL SEL SUBCLAVIAN INNOMINATE UNI R MOD SED  01/25/2020  . IR CT HEAD LTD  01/25/2020  . IR PERCUTANEOUS ART THROMBECTOMY/INFUSION INTRACRANIAL INC DIAG ANGIO  01/25/2020  . KNEE ARTHROSCOPY  2007   right  . LOOP RECORDER INSERTION N/A 01/28/2020   Procedure: LOOP RECORDER INSERTION;  Surgeon: [name], MD;  Location: [facility] CV LAB;  Service: Cardiovascular;  Laterality: N/A;  . PROSTATE BIOPSY  2008  . PROSTATE BIOPSY  2013  . PROSTATE BIOPSY  01/28/2016  .  RADIOACTIVE SEED IMPLANT N/A 04/22/2016   Procedure: RADIOACTIVE SEED IMPLANT/BRACHYTHERAPY IMPLANT;  Surgeon: [name], MD;  Location: [facility];  Service: Urology;  Laterality: N/A;  . RADIOLOGY WITH ANESTHESIA N/A 01/24/2020   Procedure: IR WITH ANESTHESIA;  Surgeon: [name], MD;  Location: [facility];  Service: Radiology;  Laterality: N/A;  . TEE WITHOUT CARDIOVERSION N/A 01/28/2020   Procedure: TRANSESOPHAGEAL ECHOCARDIOGRAM (TEE);  Surgeon: [name], MD;  Location: [facility] ENDOSCOPY;  Service: Cardiovascular;  Laterality: N/A;  . TONSILLECTOMY    . TOTAL HIP ARTHROPLASTY  2001   left  . TOTAL HIP ARTHROPLASTY Right 01/18/2018   Procedure: RIGHT TOTAL HIP ARTHROPLASTY ANTERIOR APPROACH;  Surgeon: [name], MD;  Location: [facility];  Service: Orthopedics;  Laterality: Right;    There were no vitals filed for this visit.  Subjective Assessment - 03/24/20 0941    Subjective  did his exercises over the weekend - they went pretty well.    Patient is accompained by:  Family member   wife katherine   Pertinent History  hyperlipidemia, hypertension, prostate cancer with radioactive seed implant, remote tobacco abuse.    Diagnostic tests  Cranial CT scan showed subacute infarction right lateral temporal lobe    Patient Stated Goals  wants to get left hand and arm working, wants to  improve his balance and changing direction (per wife).    Currently in Pain?  No/denies                       [facility] Adult PT Treatment/Exercise - 03/24/20 0001      Transfers   Comments  At edge of mat table on blue foam beam for compliant surface x5 reps with focus on slow eccentric control, for increased vestibular input performed x5 reps with eyes closed when descending back down to mat table, needing min guard and one episode of min A, with continued focus on eccentric control      High Level Balance   High Level Balance Comments  on blue mat next to  countertop: stepping over 3 smaller hurdles down and back 2 reps progressing from RUE support to no UE support, progressing to performing with 3 taller hurdles with pt performing first couple with a step to pattern, min guard for balance and pt needing cues for incr hip/knee flexion for clearance      Therapeutic Activites    Therapeutic Activities  Other Therapeutic Activities    Other Therapeutic Activities  Pt asking therapist when he will get full return of his R hand back and if it is a slow or fast process. Therapist educating pt that everyone's progress is different and that the greatest gains in return happen in the first 6 months after a CVA. Also educated pt on principles of neuroplasticity in regards to recovery.       Neuro Re-ed    Neuro Re-ed Details   On rockerboard in A/P direction: alternating SLS cone taps anteriorly x6 reps B, progressing to forward and then cross body cone tap x10 reps B. Progressing to being off rockerboard and then stepping on rocker board with single leg and immediately tapping contralateral foot to cone anteriorly for SLS, needing min guard and pt initially performing more slowly x10 reps - needing demonstrative and verbal cues for technique. With rockerboard in M/L direction: weight shifting x8 reps progressing to holding board steady with eyes closed, 5 reps of approx.. 15-20 seconds with therapist providing min A for steadying.                PT Short Term Goals - 03/18/20 2109      PT SHORT TERM GOAL #1   Title  Pt will be independent with initial HEP in order to build upon functional gains made in therapy. ALL STGS DUE 04/15/20    Time  4   due to delay in scheduling - 1st return visit 03/18/20   Period  Weeks    Status  New    Target Date  04/15/20      PT SHORT TERM GOAL #2   Title  Pt will improve FGA score to at least a 27/30 in order to indicate decr fall risk.    Baseline  24/30 on 03/18/20    Time  4    Period  Weeks    Status   Revised      PT SHORT TERM GOAL #3   Title  Pt will ambulate at least 300' over level surfaces while scanning environment with supervision and no LOB.    Time  4    Period  Weeks    Status  New      PT SHORT TERM GOAL #4   Title  Pt will decr 5x sit <> stand time to 14 seconds or less  in order to indicate improved functional LE strength.    Baseline  16.69 seconds on 02/15/20    Time  4    Period  Weeks    Status  New      PT SHORT TERM GOAL #5   Title  Pt will improve gait speed to at least 2.9 ft/sec in order to demo improved gait efficiency.    Time  4    Period  Weeks    Status  New        PT Long Term Goals - 03/18/20 2110      PT LONG TERM GOAL #1   Title  Pt will be independent with final HEP in order to build upon functional gains made in therapy. ALL LTGS DUE 05/13/20    Time  8   due to delay in scheduling   Period  Weeks    Status  New    Target Date  05/13/20      PT LONG TERM GOAL #2   Title  Pt will improve FGA score to at least a 29/30 in order to indicate decr fall risk.    Time  4    Period  Weeks    Status  New      PT LONG TERM GOAL #3   Title  30 second chair stand goal to be written as appropriate to demo improved LE strength and endurance.    Time  8    Period  Weeks    Status  New      PT LONG TERM GOAL #4   Title  Pt will improve gait speed to at least 3.4 ft/sec in order to demo improved gait efficiency in the community.    Time  8    Period  Weeks    Status  New      PT LONG TERM GOAL #5   Title  Pt will ambulate at least 500' over unlevel surfaces with mod I and scanning environment in order to improve community mobility.    Time  8    Period  Weeks    Status  New      PT LONG TERM GOAL #6   Title  Pt will perform 12 steps with no handrail and step through pattern with supervision in order to indicate improved functional LE strength.    Time  8    Period  Weeks    Status  New            Plan - 03/24/20 1249    Clinical  Impression Statement  Focus of today's skilled session was balance strategies on compliant surfaces with focus on SLS and eyes closed. Pt challenged by balance with eyes closed on rocker board, needing min A at times. Pt with increased difficulty stepping over higher obstacles and maintaing SLS on LLE -unable to perform reciprocally today, needed to use a step to pattern. Will continue to progress towards LTGs.    Personal Factors and Comorbidities  Comorbidity 3+;Past/Current Experience    Comorbidities  hyperlipidemia, hypertension, prostate cancer with radioactive seed implant, remote tobacco abuse.    Examination-Activity Limitations  Stairs;Locomotion Level    Examination-Participation Restrictions  Community Activity;Driving;Yard Work   being on his boat   Stability/Clinical Decision Making  Stable/Uncomplicated    Rehab Potential  Good    PT Frequency  2x / week    PT Duration  8 weeks    PT Treatment/Interventions  ADLs/Self Care Home Management;Therapeutic activities;Functional  mobility training;Stair training;Gait training;Therapeutic exercise;Balance training;Neuromuscular re-education;Patient/family education    PT Next Visit Plan  high level balance (eyes closed, obstacles/SLS, over compliant surfaces). pt would like to get back to his [place] and be on a boat (states that there are a lot of gravel surfaces and obstacles he would need to step over)    PT Home Exercise Plan  [id number]       Patient will benefit from skilled therapeutic intervention in order to improve the following deficits and impairments:  Abnormal gait, Decreased activity tolerance, Decreased balance, Decreased safety awareness, Difficulty walking, Decreased strength, Impaired UE functional use  Visit Diagnosis: No diagnosis found.     Problem List Patient Active Problem List   Diagnosis Date Noted  . Chronic bilateral low back pain without sciatica 02/25/2020  . Spasticity as late effect of  cerebrovascular accident (CVA) 02/25/2020  . Cognitive and neurobehavioral dysfunction   . Hypertensive urgency 01/28/2020  . Urinary retention 01/28/2020  . Right middle cerebral artery stroke ([place]) 01/28/2020  . Stroke (cerebrum) ([place]) -  [name] 01/25/2020  . Acute respiratory failure with hypoxia ([place])   . Hypotension   . Hypokalemia   . Hypocalcemia   . Encephalopathy acute   . Stroke ([place]) 01/24/2020  . Middle cerebral artery embolism, right 01/24/2020  . Hyperlipidemia 02/21/2019  . Impingement syndrome of right shoulder region 02/21/2018  . OA (osteoarthritis) of hip 01/18/2018  . Prostate cancer ([place]) 03/04/2016  . Essential hypertension 11/27/2014  . History of colonic polyps 09/26/2014  . BACK PAIN, LEFT 12/22/2009  . PROSTATE SPECIFIC ANTIGEN, ELEVATED 12/20/2008  . LATERAL EPICONDYLITIS 07/15/2008  . ACTINIC KERATOSIS, FOREHEAD, LEFT 05/23/2008  . WART, LEFT HAND 07/19/2007  . ERECTILE DYSFUNCTION, MILD 07/19/2007  . HERNIA, BILATERAL INGUINAL W/O OBST/GANGRENE 07/19/2007  . MENISCUS TEAR 07/19/2007  . Bilateral inguinal hernia 07/19/2007    Arliss Journey , PT, DPT  03/24/2020, 12:53 PM  [facility] [address] [place], [place], [address] Phone: [phone]   Fax:  [phone]  Name: Ruben Chavez MRN: [id number] Date of Birth: September 29, 1951

## 2020-03-24 NOTE — Patient Instructions (Signed)
  Correct the time sheet from last session. Make sure your answers are correct.

## 2020-03-26 ENCOUNTER — Ambulatory Visit: Payer: PPO

## 2020-03-26 ENCOUNTER — Other Ambulatory Visit: Payer: Self-pay

## 2020-03-26 ENCOUNTER — Encounter: Payer: Self-pay | Admitting: Occupational Therapy

## 2020-03-26 ENCOUNTER — Ambulatory Visit: Payer: PPO | Admitting: Occupational Therapy

## 2020-03-26 DIAGNOSIS — R41840 Attention and concentration deficit: Secondary | ICD-10-CM

## 2020-03-26 DIAGNOSIS — R471 Dysarthria and anarthria: Secondary | ICD-10-CM

## 2020-03-26 DIAGNOSIS — R278 Other lack of coordination: Secondary | ICD-10-CM

## 2020-03-26 DIAGNOSIS — R208 Other disturbances of skin sensation: Secondary | ICD-10-CM

## 2020-03-26 DIAGNOSIS — R2681 Unsteadiness on feet: Secondary | ICD-10-CM

## 2020-03-26 DIAGNOSIS — R414 Neurologic neglect syndrome: Secondary | ICD-10-CM

## 2020-03-26 DIAGNOSIS — R29818 Other symptoms and signs involving the nervous system: Secondary | ICD-10-CM

## 2020-03-26 DIAGNOSIS — I69354 Hemiplegia and hemiparesis following cerebral infarction affecting left non-dominant side: Secondary | ICD-10-CM

## 2020-03-26 DIAGNOSIS — R41841 Cognitive communication deficit: Secondary | ICD-10-CM

## 2020-03-26 DIAGNOSIS — M6281 Muscle weakness (generalized): Secondary | ICD-10-CM

## 2020-03-26 DIAGNOSIS — R2689 Other abnormalities of gait and mobility: Secondary | ICD-10-CM

## 2020-03-26 DIAGNOSIS — I69318 Other symptoms and signs involving cognitive functions following cerebral infarction: Secondary | ICD-10-CM

## 2020-03-26 NOTE — Therapy (Signed)
[facility] [address] [place], [place], [address] Phone: [phone]   Fax:  [phone]  Physical Therapy Treatment  Patient Details  Name: Ruben Chavez MRN: [id number] Date of Birth: 1950/12/24 Referring Provider (PT): Lauraine Rinne, PA-C (pt will be following up with Dr. Dagoberto Ligas)   Encounter Date: 03/26/2020  PT End of Session - 03/26/20 1107    Visit Number  5    Number of Visits  17    Date for PT Re-Evaluation  05/15/20   written for 60 day POC   Authorization Type  [employer]    PT Start Time  1105    PT Stop Time  1145    PT Time Calculation (min)  40 min    Equipment Utilized During Treatment  Gait belt    Activity Tolerance  Patient tolerated treatment well    Behavior During Therapy  [facility] for tasks assessed/performed       Past Medical History:  Diagnosis Date  . Elevated PSA   . Hyperlipidemia    pt says not anymore  . Hypertension   . Prostate cancer ([place]) 2013    Past Surgical History:  Procedure Laterality Date  . bilateral inguinal hernia  2008  . BUBBLE STUDY  01/28/2020   Procedure: BUBBLE STUDY;  Surgeon: [name], MD;  Location: [facility] ENDOSCOPY;  Service: Cardiovascular;;  . CYST REMOVAL TRUNK  2016   sebaceous cyst  . CYSTOSCOPY  04/22/2016   Procedure: CYSTOSCOPY;  Surgeon: [name], MD;  Location: [facility];  Service: Urology;;  . IR ANGIO VERTEBRAL SEL SUBCLAVIAN INNOMINATE UNI R MOD SED  01/25/2020  . IR CT HEAD LTD  01/25/2020  . IR PERCUTANEOUS ART THROMBECTOMY/INFUSION INTRACRANIAL INC DIAG ANGIO  01/25/2020  . KNEE ARTHROSCOPY  2007   right  . LOOP RECORDER INSERTION N/A 01/28/2020   Procedure: LOOP RECORDER INSERTION;  Surgeon: [name], MD;  Location:  CV LAB;  Service: Cardiovascular;  Laterality: N/A;  . PROSTATE BIOPSY  2008  . PROSTATE BIOPSY  2013  . PROSTATE BIOPSY  01/28/2016  . RADIOACTIVE SEED IMPLANT  N/A 04/22/2016   Procedure: RADIOACTIVE SEED IMPLANT/BRACHYTHERAPY IMPLANT;  Surgeon: [name], MD;  Location: [facility];  Service: Urology;  Laterality: N/A;  . RADIOLOGY WITH ANESTHESIA N/A 01/24/2020   Procedure: IR WITH ANESTHESIA;  Surgeon: [name], MD;  Location: [facility];  Service: Radiology;  Laterality: N/A;  . TEE WITHOUT CARDIOVERSION N/A 01/28/2020   Procedure: TRANSESOPHAGEAL ECHOCARDIOGRAM (TEE);  Surgeon: [name], MD;  Location: [facility] ENDOSCOPY;  Service: Cardiovascular;  Laterality: N/A;  . TONSILLECTOMY    . TOTAL HIP ARTHROPLASTY  2001   left  . TOTAL HIP ARTHROPLASTY Right 01/18/2018   Procedure: RIGHT TOTAL HIP ARTHROPLASTY ANTERIOR APPROACH;  Surgeon: [name], MD;  Location: [facility];  Service: Orthopedics;  Laterality: Right;    There were no vitals filed for this visit.  Subjective Assessment - 03/26/20 1107    Subjective  Pt reports that he is doing ok. Just finished OT.    Patient is accompained by:  Family member   wife katherine   Pertinent History  hyperlipidemia, hypertension, prostate cancer with radioactive seed implant, remote tobacco abuse.    Diagnostic tests  Cranial CT scan showed subacute infarction right lateral temporal lobe    Patient Stated Goals  wants to get left hand and arm working, wants to improve his balance and changing  direction (per wife).    Currently in Pain?  No/denies                       [facility] Adult PT Treatment/Exercise - 03/26/20 1109      Ambulation/Gait   Ambulation/Gait  Yes    Ambulation/Gait Assistance  5: Supervision    Ambulation/Gait Assistance Details  around in clinic during visit    Assistive device  None      Neuro Re-ed    Neuro Re-ed Details   On rockerboard positioned ant/post: maintaining level eyes open x 30 sec, x 30 sec eyes closed, rocking board ant/post x 10 with more difficulty with going posterior. Standing on rockerboard alternating toe taps on  cone x 10 then on soccerball x 10. Standing on rockerboard eyes closed with head turns left/right x 10. Standing on half foam each side for brief bouts with flat surface up with frequent LOB posterior having to grab bar. Standing on soft foam beam with eyes open x 30 sec  then adding in raising purple ball overhead 10 x 2.  SLS with one foot on soccerball trying to onlyl lightly touch 30 sec each leg. SLS with one leg on soccerball with head turns left/right x 10 each position. Reciprocal steps over 2 foam beams 4", half foam and 6" foam x 3 laps then tapping each obstacles first before stepping x 3 laps. Close SBA/CGA with activities for safety.             PT Education - 03/26/20 1738    Education Details  Pt to continue with current HEP    Person(s) Educated  Patient    Methods  Explanation    Comprehension  Verbalized understanding       PT Short Term Goals - 03/18/20 2109      PT SHORT TERM GOAL #1   Title  Pt will be independent with initial HEP in order to build upon functional gains made in therapy. ALL STGS DUE 04/15/20    Time  4   due to delay in scheduling - 1st return visit 03/18/20   Period  Weeks    Status  New    Target Date  04/15/20      PT SHORT TERM GOAL #2   Title  Pt will improve FGA score to at least a 27/30 in order to indicate decr fall risk.    Baseline  24/30 on 03/18/20    Time  4    Period  Weeks    Status  Revised      PT SHORT TERM GOAL #3   Title  Pt will ambulate at least 300' over level surfaces while scanning environment with supervision and no LOB.    Time  4    Period  Weeks    Status  New      PT SHORT TERM GOAL #4   Title  Pt will decr 5x sit <> stand time to 14 seconds or less in order to indicate improved functional LE strength.    Baseline  16.69 seconds on 02/15/20    Time  4    Period  Weeks    Status  New      PT SHORT TERM GOAL #5   Title  Pt will improve gait speed to at least 2.9 ft/sec in order to demo improved gait  efficiency.    Time  4    Period  Weeks    Status  New        PT Long Term Goals - 03/18/20 2110      PT LONG TERM GOAL #1   Title  Pt will be independent with final HEP in order to build upon functional gains made in therapy. ALL LTGS DUE 05/13/20    Time  8   due to delay in scheduling   Period  Weeks    Status  New    Target Date  05/13/20      PT LONG TERM GOAL #2   Title  Pt will improve FGA score to at least a 29/30 in order to indicate decr fall risk.    Time  4    Period  Weeks    Status  New      PT LONG TERM GOAL #3   Title  30 second chair stand goal to be written as appropriate to demo improved LE strength and endurance.    Time  8    Period  Weeks    Status  New      PT LONG TERM GOAL #4   Title  Pt will improve gait speed to at least 3.4 ft/sec in order to demo improved gait efficiency in the community.    Time  8    Period  Weeks    Status  New      PT LONG TERM GOAL #5   Title  Pt will ambulate at least 500' over unlevel surfaces with mod I and scanning environment in order to improve community mobility.    Time  8    Period  Weeks    Status  New      PT LONG TERM GOAL #6   Title  Pt will perform 12 steps with no handrail and step through pattern with supervision in order to indicate improved functional LE strength.    Time  8    Period  Weeks    Status  New            Plan - 03/26/20 1738    Clinical Impression Statement  Pt was challenged with balance activities on more compliant surfaces needing more assistance to prevent LOB at times especially with eyes closed. Had trouble with correcting posterior LOB.    Personal Factors and Comorbidities  Comorbidity 3+;Past/Current Experience    Comorbidities  hyperlipidemia, hypertension, prostate cancer with radioactive seed implant, remote tobacco abuse.    Examination-Activity Limitations  Stairs;Locomotion Level    Examination-Participation Restrictions  Community Activity;Driving;Yard Work    being on his boat   Stability/Clinical Decision Making  Stable/Uncomplicated    Rehab Potential  Good    PT Frequency  2x / week    PT Duration  8 weeks    PT Treatment/Interventions  ADLs/Self Care Home Management;Therapeutic activities;Functional mobility training;Stair training;Gait training;Therapeutic exercise;Balance training;Neuromuscular re-education;Patient/family education    PT Next Visit Plan  high level balance (eyes closed, obstacles/SLS, over compliant surfaces). [place] (states that there are a lot of gravel surfaces and obstacles he would need to step over)    PT Home Exercise Plan  [id number]       Patient will benefit from skilled therapeutic intervention in order to improve the following deficits and impairments:  Abnormal gait, Decreased activity tolerance, Decreased balance, Decreased safety awareness, Difficulty walking, Decreased strength, Impaired UE functional use  Visit Diagnosis: Other abnormalities of gait and mobility  Muscle weakness (  generalized)     Problem List Patient Active Problem List   Diagnosis Date Noted  . Chronic bilateral low back pain without sciatica 02/25/2020  . Spasticity as late effect of cerebrovascular accident (CVA) 02/25/2020  . Cognitive and neurobehavioral dysfunction   . Hypertensive urgency 01/28/2020  . Urinary retention 01/28/2020  . Right middle cerebral artery stroke ([place]) 01/28/2020  . Stroke (cerebrum) ([place]) -  [name] 01/25/2020  . Acute respiratory failure with hypoxia ([place])   . Hypotension   . Hypokalemia   . Hypocalcemia   . Encephalopathy acute   . Stroke ([place]) 01/24/2020  . Middle cerebral artery embolism, right 01/24/2020  . Hyperlipidemia 02/21/2019  . Impingement syndrome of right shoulder region 02/21/2018  . OA (osteoarthritis) of hip  01/18/2018  . Prostate cancer ([place]) 03/04/2016  . Essential hypertension 11/27/2014  . History of colonic polyps 09/26/2014  . BACK PAIN, LEFT 12/22/2009  . PROSTATE SPECIFIC ANTIGEN, ELEVATED 12/20/2008  . LATERAL EPICONDYLITIS 07/15/2008  . ACTINIC KERATOSIS, FOREHEAD, LEFT 05/23/2008  . WART, LEFT HAND 07/19/2007  . ERECTILE DYSFUNCTION, MILD 07/19/2007  . HERNIA, BILATERAL INGUINAL W/O OBST/GANGRENE 07/19/2007  . MENISCUS TEAR 07/19/2007  . Bilateral inguinal hernia 07/19/2007    Electa Sniff, PT, DPT, NCS 03/26/2020, 5:41 PM  [facility] [address] [place], [place], [address] Phone: [phone]   Fax:  [phone]  Name: YEHIA MCBAIN MRN: [id number] Date of Birth: August 13, 1951

## 2020-03-26 NOTE — Therapy (Signed)
[facility] [address] [place], [place], [address] Phone: [phone]   Fax:  [phone]  Occupational Therapy Treatment  Patient Details  Name: Ruben Chavez MRN: [id number] Date of Birth: [birth year] Referring Provider (OT): Lauraine Rinne, PA-C   Encounter Date: 03/26/2020  OT End of Session - 03/26/20 1022    Visit Number  9    Number of Visits  17    Date for OT Re-Evaluation  04/22/20    Authorization Type  [employer] (follow Medicare)--1 copay for multiple visits    Authorization Time Period  cert date 02/25/54-7/32/20    Authorization - Visit Number  9    Authorization - Number of Visits  10    Progress Note Due on Visit  10    OT Start Time  1017    OT Stop Time  1100    OT Time Calculation (min)  43 min    Activity Tolerance  Patient tolerated treatment well    Behavior During Therapy  [facility] for tasks assessed/performed       Past Medical History:  Diagnosis Date  . Elevated PSA   . Hyperlipidemia    pt says not anymore  . Hypertension   . Prostate cancer ([place]) 2013    Past Surgical History:  Procedure Laterality Date  . bilateral inguinal hernia  2008  . BUBBLE STUDY  01/28/2020   Procedure: BUBBLE STUDY;  Surgeon: [name], MD;  Location:  [facility] ENDOSCOPY;  Service: Cardiovascular;;  . CYST REMOVAL TRUNK  2016   sebaceous cyst  . CYSTOSCOPY  04/22/2016   Procedure: CYSTOSCOPY;  Surgeon: [name], MD;  Location: [facility];  Service: Urology;;  . IR ANGIO VERTEBRAL SEL SUBCLAVIAN INNOMINATE UNI R MOD SED  01/25/2020  . IR CT HEAD LTD  01/25/2020  . IR PERCUTANEOUS ART THROMBECTOMY/INFUSION INTRACRANIAL INC DIAG ANGIO  01/25/2020  . KNEE ARTHROSCOPY  2007   right  . LOOP RECORDER INSERTION N/A 01/28/2020   Procedure: LOOP RECORDER INSERTION;  Surgeon: [name], MD;  Location: [facility] CV LAB;  Service: Cardiovascular;  Laterality: N/A;  . PROSTATE BIOPSY   2008  . PROSTATE BIOPSY  2013  . PROSTATE BIOPSY  01/28/2016  . RADIOACTIVE SEED IMPLANT N/A 04/22/2016   Procedure: RADIOACTIVE SEED IMPLANT/BRACHYTHERAPY IMPLANT;  Surgeon: [name], MD;  Location: [facility];  Service: Urology;  Laterality: N/A;  . RADIOLOGY WITH ANESTHESIA N/A 01/24/2020   Procedure: IR WITH ANESTHESIA;  Surgeon: [name], MD;  Location: [facility];  Service: Radiology;  Laterality: N/A;  . TEE WITHOUT CARDIOVERSION N/A 01/28/2020   Procedure: TRANSESOPHAGEAL ECHOCARDIOGRAM (TEE);  Surgeon: [name], MD;  Location: [facility] ENDOSCOPY;  Service: Cardiovascular;  Laterality: N/A;  . TONSILLECTOMY    . TOTAL HIP ARTHROPLASTY  2001   left  . TOTAL HIP ARTHROPLASTY Right 01/18/2018   Procedure: RIGHT TOTAL HIP ARTHROPLASTY ANTERIOR APPROACH;  Surgeon: [name], MD;  Location: [facility];  Service: Orthopedics;  Laterality: Right;    There were no vitals filed for this visit.  Subjective Assessment - 03/26/20 1018    Subjective   Pt reports that he is able to move 4/5 fingers yesterday but he has to really focus.  Pt reports that he is using estim at home without difficulty    Pertinent History  PMH:  hyperlipidemia, hypertension, prostate cancer radioactive seed implant 2017, R total hip arthroplasty, loop recorder insertion    Limitations  loop recorder    Patient Stated Goals  get my L hand and fingers working    Currently in Pain?  No/denies         Edema massage to L hand/wrist followed by PROM to fingers/thumb.  (pt cued/reviewed to perform finger ROM with each finger/thumb individually and to ensure that he is bending at all 3 joints--pt still not doing this and ptrequested to explain rational x3 which therapist did).  Recommended pt perform PROM to fingers and perform edema massage prior to estim use at home for improved response.    NMESx 18 min to finger extensors 50pps, 250 pulse width, 10sec cycle, 2sec ramp, intensity=22 with no  adverse reactions  While cueing for pt trying to actively extend fingers with "on" time (min facilitation) and while sliding/extending out along table to reach/open hand in prep to grasp large cylinder object.  Then same parameters for wrist/finger flexors x 10 min with intensity of 22 (pt with decr tolerance for higher intensity) noted wrist flex and approx 25% finger flex while therapist providing facilitation for incr finger flex with improvements noted after and then while attempted to grasp small ball/hold with mod facilitation.    Reviewed positioning of electrodes for wrist/finger extensors for home unit as pt reports decr response compared to clinic.  Took picture of placement with pt's phone and discussed things that can impact response.   Pt continues to ask the questions repetitively at times/perseverates during session due to memory/cognitive deficits.          OT Short Term Goals - 02/25/20 1218      OT SHORT TERM GOAL #1   Title  Pt will be independent with initial HEP--check STG 03/24/20    Time  4    Period  Weeks    Status  On-going      OT SHORT TERM GOAL #2   Title  Pt will verbalize understanding of proper positioning of LUE to prevent pain/injury and edema management techniques.    Time  4    Period  Weeks    Status  On-going      OT SHORT TERM GOAL #3   Title  Pt will be independent with splint wear/care for improved positioning.    Time  4    Period  Weeks    Status  On-going      OT SHORT TERM GOAL #4   Title  Pt will be able to use LUE as a stabilizer for simple ADL tasks.    Time  4    Period  Weeks    Status  New      OT SHORT TERM GOAL #5   Title  Pt will demo at least 25% gross finger flex in prep for functional grasp.    Baseline  4    Time  4    Period  Weeks    Status  New        OT Long Term Goals - 03/14/20 1209      OT LONG TERM GOAL #1   Title  Pt will be independent with updated HEP.--check LTGs 04/23/20    Time  8    Period   Weeks    Status  New      OT LONG TERM GOAL #2   Title  Pt will be able to use LUE as nondominant assist for ADLs at least 50% of the time.    Time  8    Period  Weeks    Status  New      OT LONG TERM GOAL #3   Title  Pt will be able to score at least 10 on box and blocks test with LUE for improved coordination for ADLs.    Time  8    Period  Weeks    Status  New      OT LONG TERM GOAL #4   Title  Pt will complete clothing fasteners mod I using AE prn.    Time  8    Period  Weeks    Status  New      OT LONG TERM GOAL #5   Title  Pt will demo improved attention/visual scanning to be able to perform environmental scanning in busy enivornment with at least 95% accuracy for incr safety for community activities/driving    Baseline  03/14/20;  58% initially    Time  8    Period  Weeks    Status  New      OT LONG TERM GOAL #6   Title  Pt will demo at least 75% gross finger flex/ext for grasp/release of objects.    Time  8    Period  Weeks    Status  New            Plan - 03/26/20 1023    Occupational performance deficits (Please refer to evaluation for details):  ADL's;IADL's;Leisure;Social Participation;Work    Marketing executive / Function / Physical Skills  ADL;Dexterity;ROM;IADL;Sensation;Mobility;Strength;FMC;Coordination;Decreased knowledge of precautions;Decreased knowledge of use of DME;Pain;UE functional use;GMC;Proprioception    Cognitive Skills  Attention;Memory;Perception;Safety Awareness    Rehab Potential  Fair    OT Frequency  2x / week    OT Duration  8 weeks    OT Treatment/Interventions  Self-care/ADL training;Moist Heat;Fluidtherapy;DME and/or AE instruction;Splinting;Therapeutic activities;Contrast Bath;Aquatic Therapy;Ultrasound;Therapeutic exercise;Cognitive remediation/compensation;Visual/perceptual remediation/compensation;Passive range of motion;Functional Mobility Training;Neuromuscular education;Cryotherapy;Electrical Stimulation;Paraffin;Energy  conservation;Manual Therapy;Patient/family education    Plan  continue neuro re-education, use of NMES (try finger flexors next session - only in clinic)    Consulted and Agree with Plan of Care  Patient       Patient will benefit from skilled therapeutic intervention in order to improve the following deficits and impairments:   Body Structure / Function / Physical Skills: ADL, Dexterity, ROM, IADL, Sensation, Mobility, Strength, FMC, Coordination, Decreased knowledge of precautions, Decreased knowledge of use of DME, Pain, UE functional use, GMC, Proprioception Cognitive Skills: Attention, Memory, Perception, Safety Awareness     Visit Diagnosis: Hemiplegia and hemiparesis following cerebral infarction affecting left non-dominant side (HCC)  Other disturbances of skin sensation  Attention and concentration deficit  Other lack of coordination  Unsteadiness on feet  Neurologic neglect syndrome  Other symptoms and signs involving cognitive functions following cerebral infarction  Other symptoms and signs involving the nervous system    Problem List Patient Active Problem List   Diagnosis Date Noted  . Chronic bilateral low back pain without sciatica 02/25/2020  . Spasticity as late effect of cerebrovascular accident (CVA) 02/25/2020  . Cognitive and neurobehavioral dysfunction   . Hypertensive urgency 01/28/2020  . Urinary retention 01/28/2020  . Right middle cerebral artery stroke ([place]) 01/28/2020  . Stroke (cerebrum) ([place]) -  [name] 01/25/2020  . Acute respiratory failure with hypoxia ([place])   . Hypotension   . Hypokalemia   . Hypocalcemia   . Encephalopathy acute   .  Stroke ([place]) 01/24/2020  . Middle cerebral artery embolism, right 01/24/2020  . Hyperlipidemia 02/21/2019  . Impingement syndrome of right shoulder region 02/21/2018  . OA (osteoarthritis) of hip  01/18/2018  . Prostate cancer ([place]) 03/04/2016  . Essential hypertension 11/27/2014  . History of colonic polyps 09/26/2014  . BACK PAIN, LEFT 12/22/2009  . PROSTATE SPECIFIC ANTIGEN, ELEVATED 12/20/2008  . LATERAL EPICONDYLITIS 07/15/2008  . ACTINIC KERATOSIS, FOREHEAD, LEFT 05/23/2008  . WART, LEFT HAND 07/19/2007  . ERECTILE DYSFUNCTION, MILD 07/19/2007  . HERNIA, BILATERAL INGUINAL W/O OBST/GANGRENE 07/19/2007  . MENISCUS TEAR 07/19/2007  . Bilateral inguinal hernia 07/19/2007    [facility] 03/26/2020, 10:23 AM  [facility] [address] [place], [place], [address] Phone: [phone]   Fax:  [phone]  Name: Ruben Chavez MRN: [id number] Date of Birth: August 29, 1951   Vianne Bulls, OTR/L [facility] [address] [place], [place]  [address] [phone] phone [phone] 03/26/20 11:29 AM

## 2020-03-26 NOTE — Therapy (Signed)
[facility] [address] [place], [place], [address] Phone: [phone]   Fax:  [phone]  Speech Language Pathology Treatment  Patient Details  Name: Ruben Chavez MRN: [id number] Date of Birth: 01-31-1951 Referring Provider (SLP): Lauraine Rinne, PA-C   Encounter Date: 03/26/2020  End of Session - 03/26/20 1149    Visit Number  6    Number of Visits  17    Date for SLP Re-Evaluation  04/19/20    SLP Start Time  0935    SLP Stop Time   9379    SLP Time Calculation (min)  40 min    Activity Tolerance  Patient tolerated treatment well       Past Medical History:  Diagnosis Date  . Elevated PSA   . Hyperlipidemia    pt says not anymore  . Hypertension   . Prostate cancer ([place]) 2013    Past Surgical History:  Procedure Laterality Date  . bilateral inguinal hernia  2008  . BUBBLE STUDY  01/28/2020   Procedure: BUBBLE STUDY;  Surgeon: [name], MD;  Location: [facility] ENDOSCOPY;  Service: Cardiovascular;;  . CYST REMOVAL TRUNK  2016   sebaceous cyst  . CYSTOSCOPY  04/22/2016   Procedure: CYSTOSCOPY;  Surgeon: [name], MD;  Location: [facility];  Service: Urology;;  . IR ANGIO VERTEBRAL SEL SUBCLAVIAN INNOMINATE UNI R MOD SED  01/25/2020  . IR CT HEAD LTD  01/25/2020  . IR PERCUTANEOUS ART THROMBECTOMY/INFUSION INTRACRANIAL INC DIAG ANGIO  01/25/2020  . KNEE ARTHROSCOPY  2007   right  . LOOP RECORDER INSERTION N/A 01/28/2020   Procedure: LOOP RECORDER INSERTION;  Surgeon: [name], MD;  Location: [facility] CV LAB;  Service: Cardiovascular;  Laterality: N/A;  . PROSTATE BIOPSY  2008  . PROSTATE BIOPSY  2013  . PROSTATE BIOPSY  01/28/2016  . RADIOACTIVE SEED IMPLANT N/A 04/22/2016   Procedure: RADIOACTIVE SEED IMPLANT/BRACHYTHERAPY IMPLANT;  Surgeon: [name], MD;  Location: [facility];  Service: Urology;  Laterality: N/A;  . RADIOLOGY WITH ANESTHESIA  N/A 01/24/2020   Procedure: IR WITH ANESTHESIA;  Surgeon: [name], MD;  Location: [facility];  Service: Radiology;  Laterality: N/A;  . TEE WITHOUT CARDIOVERSION N/A 01/28/2020   Procedure: TRANSESOPHAGEAL ECHOCARDIOGRAM (TEE);  Surgeon: [name], MD;  Location: [facility] ENDOSCOPY;  Service: Cardiovascular;  Laterality: N/A;  . TONSILLECTOMY    . TOTAL HIP ARTHROPLASTY  2001   left  . TOTAL HIP ARTHROPLASTY Right 01/18/2018   Procedure: RIGHT TOTAL HIP ARTHROPLASTY ANTERIOR APPROACH;  Surgeon: [name], MD;  Location: [facility];  Service: Orthopedics;  Laterality: Right;    There were no vitals filed for this visit.  Subjective Assessment - 03/26/20 0941    Subjective  "I had a celebratory moment yesterday - I was able to move three of my fingers."    Currently in Pain?  No/denies            ADULT SLP TREATMENT - 03/26/20 0942      General Information   Behavior/Cognition  Alert;Cooperative;Pleasant mood;Impulsive      Treatment Provided   Treatment provided  Cognitive-Linquistic      Cognitive-Linquistic Treatment   Treatment focused on  Cognition    Skilled Treatment  "I forgot I had my homework and then realized I saw you first thing." (pt homework not comleted). SLP assisted pt correct his answers - 0% emergent awareness. Extra time and min-mod cues  usually necessary to figure corect answers mainly due to decr'd attention.       Assessment / Recommendations / Plan   Plan  Continue with current plan of care;Goals updated      Progression Toward Goals   Progression toward goals  Progressing toward goals         SLP Short Term Goals - 03/26/20 1150      SLP SHORT TERM GOAL #1   Title  Patient will use compensatory aids/strategies (electronic or paper) with occasional cues from spouse to be on time for appointments x3 sessions    Baseline  03-24-20, 03-26-20    Status  Partially Met      SLP SHORT TERM GOAL #2   Title  Patient will complete standardized  cognitive testing within the first 3 sessions.    Status  Achieved      SLP SHORT TERM GOAL #3   Title  Patient will demonstrate appropriate topic maintenance over 5 minute conversation with occasional min cues x2 sessions.    Status  Not Met      SLP SHORT TERM GOAL #4   Title  pt will demo selective attention adequate to complete a 5-minute therapy task in 3 sessions    Baseline  03-24-20    Status  Partially Met      SLP SHORT TERM GOAL #5   Title  pt will demo functional organization in a therapy task, with occasional mod A    Status  Not Met       SLP Long Term Goals - 03/26/20 1151      SLP LONG TERM GOAL #1   Title  Patient will use compensatory aids/strategies (electronic or paper) to manage schedule, appointments, and daily tasks with rare min A x 4 sessions.    Time  5    Period  Weeks    Status  On-going      SLP LONG TERM GOAL #2   Title  Pt will ID and correct errors on financial documents with occasional min A over 3 sessions    Time  5    Period  Weeks    Status  Revised      SLP LONG TERM GOAL #3   Title  Patient will demonstrate appropriate topic maintenance over 6 minute conversation with modified independence x 3 sessions.    Time  5    Period  Weeks    Status  Revised      SLP LONG TERM GOAL #4   Title  pt will demo selective attention adequate for completing a simple to mod-complex 10-minute task, over three sessions    Time  5    Period  Weeks    Status  Revised       Plan - 03/26/20 1150    Clinical Impression Statement  [name] presents with, functionally, moderate cognitive communication deficits. Pt demonstrated reduced awareness/insight into his deficits, however this is improving. I recommend cont'd skilled ST to address cognitive communication impairment to maximize safety and independence at home.    Speech Therapy Frequency  2x / week    Duration  --   8 weeks or 17 visits   Treatment/Interventions  Environmental  controls;SLP instruction and feedback;Cueing hierarchy;Compensatory techniques;Cognitive reorganization;Functional tasks;Compensatory strategies;Internal/external aids;Multimodal communcation approach;Patient/family education    Potential to Achieve Goals  Good    Consulted and Agree with Plan of Care  Patient;Family member/caregiver       Patient will benefit from  skilled therapeutic intervention in order to improve the following deficits and impairments:   Cognitive communication deficit  Dysarthria and anarthria    Problem List Patient Active Problem List   Diagnosis Date Noted  . Chronic bilateral low back pain without sciatica 02/25/2020  . Spasticity as late effect of cerebrovascular accident (CVA) 02/25/2020  . Cognitive and neurobehavioral dysfunction   . Hypertensive urgency 01/28/2020  . Urinary retention 01/28/2020  . Right middle cerebral artery stroke ([place]) 01/28/2020  . Stroke (cerebrum) ([place]) -  [name] 01/25/2020  . Acute respiratory failure with hypoxia ([place])   . Hypotension   . Hypokalemia   . Hypocalcemia   . Encephalopathy acute   . Stroke ([place]) 01/24/2020  . Middle cerebral artery embolism, right 01/24/2020  . Hyperlipidemia 02/21/2019  . Impingement syndrome of right shoulder region 02/21/2018  . OA (osteoarthritis) of hip 01/18/2018  . Prostate cancer ([place]) 03/04/2016  . Essential hypertension 11/27/2014  . History of colonic polyps 09/26/2014  . BACK PAIN, LEFT 12/22/2009  . PROSTATE SPECIFIC ANTIGEN, ELEVATED 12/20/2008  . LATERAL EPICONDYLITIS 07/15/2008  . ACTINIC KERATOSIS, FOREHEAD, LEFT 05/23/2008  . WART, LEFT HAND 07/19/2007  . ERECTILE DYSFUNCTION, MILD 07/19/2007  . HERNIA, BILATERAL INGUINAL W/O OBST/GANGRENE 07/19/2007  . MENISCUS TEAR 07/19/2007  . Bilateral inguinal hernia 07/19/2007    [facility] ,MS, CCC-SLP  03/26/2020,  11:52 AM  [facility] [address] [place], [place], [address] Phone: [phone]   Fax:  [phone]   Name: Ruben Chavez MRN: [id number] Date of Birth: 05-Oct-1951

## 2020-03-28 ENCOUNTER — Other Ambulatory Visit: Payer: Self-pay

## 2020-03-28 ENCOUNTER — Encounter: Payer: PPO | Attending: Physical Medicine and Rehabilitation | Admitting: Physical Medicine and Rehabilitation

## 2020-03-28 ENCOUNTER — Encounter: Payer: Self-pay | Admitting: Physical Medicine and Rehabilitation

## 2020-03-28 VITALS — BP 128/71 | HR 60 | Temp 97.5°F | Wt 173.0 lb

## 2020-03-28 DIAGNOSIS — I69398 Other sequelae of cerebral infarction: Secondary | ICD-10-CM | POA: Diagnosis not present

## 2020-03-28 DIAGNOSIS — I63511 Cerebral infarction due to unspecified occlusion or stenosis of right middle cerebral artery: Secondary | ICD-10-CM | POA: Diagnosis not present

## 2020-03-28 DIAGNOSIS — R252 Cramp and spasm: Secondary | ICD-10-CM

## 2020-03-28 DIAGNOSIS — F09 Unspecified mental disorder due to known physiological condition: Secondary | ICD-10-CM | POA: Diagnosis not present

## 2020-03-28 DIAGNOSIS — F0789 Other personality and behavioral disorders due to known physiological condition: Secondary | ICD-10-CM | POA: Insufficient documentation

## 2020-03-28 DIAGNOSIS — G8194 Hemiplegia, unspecified affecting left nondominant side: Secondary | ICD-10-CM

## 2020-03-28 DIAGNOSIS — G8929 Other chronic pain: Secondary | ICD-10-CM | POA: Diagnosis not present

## 2020-03-28 DIAGNOSIS — M545 Low back pain: Secondary | ICD-10-CM | POA: Insufficient documentation

## 2020-03-28 NOTE — Progress Notes (Signed)
Subjective:    Patient ID: Ruben Chavez, male    DOB: Mar 03, 1951, 69 y.o.   MRN: [id number]  HPI   Patient is a 69 yr old male with R MCA CVA s/p loop recorder with hx of prostate CA s/p 3 yr ago; HTN, HLD, low back pain and LUE edema here for f/u.    Went over pt concerns for Cholesterol- HDL, LDL- total cholesterol-  BMI is 24.13-   Went over all paperwork and concerns for CVA. Has appointment with PCP- saw Neurology and Cardiology.   Needs f/u with IR- interventional Radiology.   Now can move 4/5 fingers on his own- not thumb yet.  Can flex them, not extend them.    Pain Inventory Average Pain 1 Pain Right Now 0 My pain is dull and aching  In the last 24 hours, has pain interfered with the following? General activity 0 Relation with others 0 Enjoyment of life 0 What TIME of day is your pain at its worst? evening Sleep (in general) Good  Pain is worse with: unsure Pain improves with: therapy/exercise and medication Relief from Meds: 8  Mobility walk without assistance ability to climb steps?  yes do you drive?  no Do you have any goals in this area?  yes  Function not employed: date last employed . retired Do you have any goals in this area?  no  Neuro/Psych No problems in this area  Prior Studies Any changes since last visit?  no  Physicians involved in your care Any changes since last visit?  no   Family History  Problem Relation Age of Onset  . Pancreatic cancer Mother   . Heart disease Father   . Colon cancer Neg Hx    Social History   Socioeconomic History  . Marital status: Married    Spouse name: Not on file  . Number of children: Not on file  . Years of education: Not on file  . Highest education level: Not on file  Occupational History  . Not on file  Tobacco Use  . Smoking status: Former Smoker    Packs/day: 1.00    Years: 32.00    Pack years: 32.00    Types: Cigarettes    Quit date: 06/06/1999    Years since quitting:  20.8  . Smokeless tobacco: Never Used  Substance and Sexual Activity  . Alcohol use: Yes    Alcohol/week: 14.0 standard drinks    Types: 14 Shots of liquor per week  . Drug use: No  . Sexual activity: Yes  Other Topics Concern  . Not on file  Social History Narrative  . Not on file   Social Determinants of Health   Financial Resource Strain:   . Difficulty of Paying Living Expenses:   Food Insecurity:   . Worried About Charity fundraiser in the Last Year:   . Arboriculturist in the Last Year:   Transportation Needs:   . Film/video editor (Medical):   Marland Kitchen Lack of Transportation (Non-Medical):   Physical Activity:   . Days of Exercise per Week:   . Minutes of Exercise per Session:   Stress:   . Feeling of Stress :   Social Connections:   . Frequency of Communication with Friends and Family:   . Frequency of Social Gatherings with Friends and Family:   . Attends Religious Services:   . Active Member of Clubs or Organizations:   . Attends Archivist Meetings:   .  Marital Status:    Past Surgical History:  Procedure Laterality Date  . bilateral inguinal hernia  2008  . BUBBLE STUDY  01/28/2020   Procedure: BUBBLE STUDY;  Surgeon: [name], MD;  Location: [facility] ENDOSCOPY;  Service: Cardiovascular;;  . CYST REMOVAL TRUNK  2016   sebaceous cyst  . CYSTOSCOPY  04/22/2016   Procedure: CYSTOSCOPY;  Surgeon: [name], MD;  Location: [facility];  Service: Urology;;  . IR ANGIO VERTEBRAL SEL SUBCLAVIAN INNOMINATE UNI R MOD SED  01/25/2020  . IR CT HEAD LTD  01/25/2020  . IR PERCUTANEOUS ART THROMBECTOMY/INFUSION INTRACRANIAL INC DIAG ANGIO  01/25/2020  . KNEE ARTHROSCOPY  2007   right  . LOOP RECORDER INSERTION N/A 01/28/2020   Procedure: LOOP RECORDER INSERTION;  Surgeon: [name], MD;  Location: [facility] CV LAB;  Service: Cardiovascular;  Laterality: N/A;  . PROSTATE BIOPSY  2008  . PROSTATE BIOPSY  2013  . PROSTATE BIOPSY   01/28/2016  . RADIOACTIVE SEED IMPLANT N/A 04/22/2016   Procedure: RADIOACTIVE SEED IMPLANT/BRACHYTHERAPY IMPLANT;  Surgeon: [name], MD;  Location: [facility];  Service: Urology;  Laterality: N/A;  . RADIOLOGY WITH ANESTHESIA N/A 01/24/2020   Procedure: IR WITH ANESTHESIA;  Surgeon: [name], MD;  Location: [facility];  Service: Radiology;  Laterality: N/A;  . TEE WITHOUT CARDIOVERSION N/A 01/28/2020   Procedure: TRANSESOPHAGEAL ECHOCARDIOGRAM (TEE);  Surgeon: [name], MD;  Location: [facility] ENDOSCOPY;  Service: Cardiovascular;  Laterality: N/A;  . TONSILLECTOMY    . TOTAL HIP ARTHROPLASTY  2001   left  . TOTAL HIP ARTHROPLASTY Right 01/18/2018   Procedure: RIGHT TOTAL HIP ARTHROPLASTY ANTERIOR APPROACH;  Surgeon: [name], MD;  Location: [facility];  Service: Orthopedics;  Laterality: Right;   Past Medical History:  Diagnosis Date  . Elevated PSA   . Hyperlipidemia    pt says not anymore  . Hypertension   . Prostate cancer ([place]) 2013   BP 128/71   Pulse 60   Temp (!) 97.5 F (36.4 C)   Wt 173 lb (78.5 kg)   SpO2 98%   BMI 24.13 kg/m   Opioid Risk Score:   Fall Risk Score:  `1  Depression screen PHQ 2/9  Depression screen [facility] 2/9 03/19/2020 10/09/2019 02/21/2019 08/21/2018 10/05/2016 05/04/2016 03/18/2016  Decreased Interest 0 0 0 0 0 0 0  Down, Depressed, Hopeless 0 0 0 0 0 0 0  PHQ - 2 Score 0 0 0 0 0 0 0  Altered sleeping - 0 - - - - -  Tired, decreased energy - 0 - - - - -  Change in appetite - 0 - - - - -  Feeling bad or failure about yourself  - 0 - - - - -  Trouble concentrating - 0 - - - - -  Moving slowly or fidgety/restless - 0 - - - - -  Suicidal thoughts - 0 - - - - -  PHQ-9 Score - 0 - - - - -  Difficult doing work/chores - Not difficult at all - - - - -    Review of Systems  Constitutional: Positive for unexpected weight change.  HENT: Negative.   Eyes: Negative.   Respiratory: Negative.   Cardiovascular: Negative.     Gastrointestinal: Positive for constipation.  Endocrine: Negative.   Genitourinary: Negative.   Musculoskeletal: Positive for gait problem.  Skin: Negative.   Hematological: Negative.   Psychiatric/Behavioral: Negative.   All other systems  reviewed and are negative.      Objective:   Physical Exam  Awake, alert, appropriate, with wife, no AD, NAD Mild hand/finger swelling- less than before- wearing compression glove  Hoffman's strongly (+) on L Negative on R MAS of 1 in 2nd/3rd and 4th digits on L hand- not anywhere else 2 beats clonus NOT recurring of note, on L foot/ankle, but couldn't get it to occur again.  L biceps 4-/5, triceps 3+/5, WE 2/5, - tiny flexion of fingers with volition.  Has a grinding sound with L Wrist movement LLE- HF 4/5, KE 4/5, DF and PF 5-/5      Assessment & Plan:    Patient is a 69 yr old male with R MCA CVA s/p loop recorder with hx of prostate CA s/p 3 yr ago; HTN, HLD, low back pain and LUE edema here for f/u.   1. Needs to call f/u with Interventional Radiology- has paperwork.  2. Spasticity is very mild- unlikely to get significantly worse- so will have pt not f/u per his request unless needed.   3.  Got estim and doing at home now- is working  4. No spasticity meds required.   5. F/U prn-  - if develops spasticity OR needs anything functionally.  I spent a total of 35 minutes on visit- total- more than 20 minutes discussing spasticity

## 2020-03-28 NOTE — Patient Instructions (Signed)
Patient is a 69 yr old male with R MCA CVA s/p loop recorder with hx of prostate CA s/p 3 yr ago; HTN, HLD, low back pain and LUE edema here for f/u.   1. Needs to call f/u with Interventional Radiology- has paperwork.  2. Spasticity is very mild- unlikely to get significantly worse- so will have pt not f/u per his request unless needed.   3.  Got estim and doing at home now- is working  4. No spasticity meds required.   5. F/U prn-  - if develops spasticity OR needs anything functionally.

## 2020-03-31 ENCOUNTER — Ambulatory Visit: Payer: PPO

## 2020-03-31 ENCOUNTER — Ambulatory Visit: Payer: PPO | Admitting: Occupational Therapy

## 2020-03-31 ENCOUNTER — Other Ambulatory Visit: Payer: Self-pay

## 2020-03-31 ENCOUNTER — Ambulatory Visit: Payer: PPO | Admitting: Physical Therapy

## 2020-03-31 DIAGNOSIS — R2681 Unsteadiness on feet: Secondary | ICD-10-CM

## 2020-03-31 DIAGNOSIS — R29818 Other symptoms and signs involving the nervous system: Secondary | ICD-10-CM

## 2020-03-31 DIAGNOSIS — R471 Dysarthria and anarthria: Secondary | ICD-10-CM

## 2020-03-31 DIAGNOSIS — I69354 Hemiplegia and hemiparesis following cerebral infarction affecting left non-dominant side: Secondary | ICD-10-CM | POA: Diagnosis not present

## 2020-03-31 DIAGNOSIS — R41840 Attention and concentration deficit: Secondary | ICD-10-CM

## 2020-03-31 DIAGNOSIS — R208 Other disturbances of skin sensation: Secondary | ICD-10-CM

## 2020-03-31 DIAGNOSIS — R41841 Cognitive communication deficit: Secondary | ICD-10-CM

## 2020-03-31 DIAGNOSIS — R278 Other lack of coordination: Secondary | ICD-10-CM

## 2020-03-31 NOTE — Patient Instructions (Signed)
  Please complete the assigned speech therapy homework prior to your next session and return it to the speech therapist at your next visit.  

## 2020-03-31 NOTE — Therapy (Signed)
[facility] [address] [place], [place], [address] Phone: [phone]   Fax:  [phone]  Occupational Therapy Treatment  Patient Details  Name: Ruben Chavez MRN: [id number] Date of Birth: 11/28/1951 Referring Provider (OT): Daniel Angiulli, PA-C   Encounter Date: 03/31/2020  OT End of Session - 03/31/20 1143    Visit Number  10    Number of Visits  17    Date for OT Re-Evaluation  04/22/20    Authorization Type  [employer] (follow Medicare)--1 copay for multiple visits    Authorization Time Period  cert date 02/22/20-05/22/20    Authorization - Visit Number  10    Authorization - Number of Visits  10    Progress Note Due on Visit  10    OT Start Time  1105    OT Stop Time  1145    OT Time Calculation (min)  40 min    Activity Tolerance  Patient tolerated treatment well    Behavior During Therapy  WFL for tasks assessed/performed       Past Medical History:  Diagnosis Date  . Elevated PSA   . Hyperlipidemia    pt says not anymore  . Hypertension   . Prostate cancer (HCC) 2013    Past Surgical History:  Procedure Laterality Date  . bilateral inguinal hernia  2008  . BUBBLE STUDY  01/28/2020   Procedure: BUBBLE STUDY;  Surgeon: Nishan, Peter C, MD;  Location: [facility];  Service: Cardiovascular;;  . CYST REMOVAL TRUNK  2016   sebaceous cyst  . CYSTOSCOPY  04/22/2016   Procedure: CYSTOSCOPY;  Surgeon: Stephen Dahlstedt, MD;  Location: [facility];  Service: Urology;;  . IR ANGIO VERTEBRAL SEL SUBCLAVIAN INNOMINATE UNI R MOD SED  01/25/2020  . IR CT HEAD LTD  01/25/2020  . IR PERCUTANEOUS ART THROMBECTOMY/INFUSION INTRACRANIAL INC DIAG ANGIO  01/25/2020  . KNEE ARTHROSCOPY  2007   right  . LOOP RECORDER INSERTION N/A 01/28/2020   Procedure: LOOP RECORDER INSERTION;  Surgeon: Klein, Steven C, MD;  [facility];  Service: Cardiovascular;  Laterality: N/A;  . PROSTATE BIOPSY   2008  . PROSTATE BIOPSY  2013  . PROSTATE BIOPSY  01/28/2016  . RADIOACTIVE SEED IMPLANT N/A 04/22/2016   Procedure: RADIOACTIVE SEED IMPLANT/BRACHYTHERAPY IMPLANT;  Surgeon: Stephen Dahlstedt, MD;  Location: [facility];  Service: Urology;  Laterality: N/A;  . RADIOLOGY WITH ANESTHESIA N/A 01/24/2020   Procedure: IR WITH ANESTHESIA;  Surgeon: Deveshwar, Sanjeev, MD;  Location: [facility];  Service: Radiology;  Laterality: N/A;  . TEE WITHOUT CARDIOVERSION N/A 01/28/2020   Procedure: TRANSESOPHAGEAL ECHOCARDIOGRAM (TEE);  Surgeon: Nishan, Peter C, MD;  Location: [facility];  Service: Cardiovascular;  Laterality: N/A;  . TONSILLECTOMY    . TOTAL HIP ARTHROPLASTY  2001   left  . TOTAL HIP ARTHROPLASTY Right 01/18/2018   Procedure: RIGHT TOTAL HIP ARTHROPLASTY ANTERIOR APPROACH;  Surgeon: Aluisio, Frank, MD;  Location: [facility];  Service: Orthopedics;  Laterality: Right;    There were no vitals filed for this visit.  Subjective Assessment - 03/31/20 1142    Subjective   Pt only reports pain Lt wrist occasionally and mild w/ estim    Pertinent History  PMH:  hyperlipidemia, hypertension, prostate cancer radioactive seed implant 2017, R total hip arthroplasty, loop recorder insertion    Limitations  loop recorder    Patient Stated Goals  get my L hand and fingers working      Currently in Pain?  Yes       Retrograde massage to decrease edema followed by passive stretch in composite finger flexion and composite wrist and finger extension. Pt issued new medium compression glove for edema.  Pt able to perform wrist extension against gravity past neutral 2 sets of 5 reps! Pt also performing active finger flexion at approx 25% except index and thumb (approx 10-15%). Pt performing simple functional reaching to grasp/release 2" cylindrical objects and retrieve/replace on higher level surface w/ min assist distally for forearm/wrist. Pt with min to mod drops. Pt also able to sustain attention for  greater time w/o internal distractions NMES x 10 min to wrist and finger extensors, Int = 23, previous parameters. Pt responded well. No longer feel pt needs NMES to finger flexors d/t achieving actively on his own now and beginning signs of mild tone (vs. flaccid)                       OT Short Term Goals - 03/31/20 1144      OT SHORT TERM GOAL #1   Title  Pt will be independent with initial HEP--check STG 03/24/20    Time  4    Period  Weeks    Status  On-going   03/26/20 continues to need cueing for positioning due to cognitive deficits     OT SHORT TERM GOAL #2   Title  Pt will verbalize understanding of proper positioning of LUE to prevent pain/injury and edema management techniques.    Time  4    Period  Weeks    Status  On-going   03/26/20:  continues to need cueing for proper positioning/techniques     OT SHORT TERM GOAL #3   Title  Pt will be independent with splint wear/care for improved positioning.    Time  4    Period  Weeks    Status  Achieved      OT SHORT TERM GOAL #4   Title  Pt will be able to use LUE as a stabilizer for simple ADL tasks.    Time  4    Period  Weeks    Status  On-going      OT SHORT TERM GOAL #5   Title  Pt will demo at least 25% gross finger flex in prep for functional grasp.    Baseline  4    Time  4    Period  Weeks    Status  Achieved   except index and thumb       OT Long Term Goals - 03/14/20 1209      OT LONG TERM GOAL #1   Title  Pt will be independent with updated HEP.--check LTGs 04/23/20    Time  8    Period  Weeks    Status  New      OT LONG TERM GOAL #2   Title  Pt will be able to use LUE as nondominant assist for ADLs at least 50% of the time.    Time  8    Period  Weeks    Status  New      OT LONG TERM GOAL #3   Title  Pt will be able to score at least 10 on box and blocks test with LUE for improved coordination for ADLs.    Time  8    Period  Weeks    Status  New      OT   LONG TERM GOAL #4    Title  Pt will complete clothing fasteners mod I using AE prn.    Time  8    Period  Weeks    Status  New      OT LONG TERM GOAL #5   Title  Pt will demo improved attention/visual scanning to be able to perform environmental scanning in busy enivornment with at least 95% accuracy for incr safety for community activities/driving    Baseline  03/14/20;  58% initially    Time  8    Period  Weeks    Status  New      OT LONG TERM GOAL #6   Title  Pt will demo at least 75% gross finger flex/ext for grasp/release of objects.    Time  8    Period  Weeks    Status  New            Plan - 03/31/20 1221    Clinical Impression Statement  This 10th progress note is for dates 02/22/20 - 03/31/20: Pt has met 2 STG's and progressing towards remaining goals however still requires cues. Pt has shown progress just in the last week with active wrist extension against gravity, 25% finger flexion, and increased sustained attention with LUE simple use.    Occupational performance deficits (Please refer to evaluation for details):  ADL's;IADL's;Leisure;Social Participation;Work    Marketing executive / Function / Physical Skills  ADL;Dexterity;ROM;IADL;Sensation;Mobility;Strength;FMC;Coordination;Decreased knowledge of precautions;Decreased knowledge of use of DME;Pain;UE functional use;GMC;Proprioception    Cognitive Skills  Attention;Memory;Perception;Safety Awareness    Rehab Potential  Fair    OT Frequency  2x / week    OT Duration  8 weeks    OT Treatment/Interventions  Self-care/ADL training;Moist Heat;Fluidtherapy;DME and/or AE instruction;Splinting;Therapeutic activities;Contrast Bath;Aquatic Therapy;Ultrasound;Therapeutic exercise;Cognitive remediation/compensation;Visual/perceptual remediation/compensation;Passive range of motion;Functional Mobility Training;Neuromuscular education;Cryotherapy;Electrical Stimulation;Paraffin;Energy conservation;Manual Therapy;Patient/family education    Plan  continue  Neuro re-educ, NMES to wrist and finger extensors (no longer needed for finger flexion as pt is getting actively at this time!)    Consulted and Agree with Plan of Care  Patient       Patient will benefit from skilled therapeutic intervention in order to improve the following deficits and impairments:   Body Structure / Function / Physical Skills: ADL, Dexterity, ROM, IADL, Sensation, Mobility, Strength, FMC, Coordination, Decreased knowledge of precautions, Decreased knowledge of use of DME, Pain, UE functional use, GMC, Proprioception Cognitive Skills: Attention, Memory, Perception, Safety Awareness     Visit Diagnosis: Hemiplegia and hemiparesis following cerebral infarction affecting left non-dominant side (HCC)  Attention and concentration deficit  Other disturbances of skin sensation    Problem List Patient Active Problem List   Diagnosis Date Noted  . Left hemiparesis ([place]) 03/28/2020  . Chronic bilateral low back pain without sciatica 02/25/2020  . Spasticity as late effect of cerebrovascular accident (CVA) 02/25/2020  . Cognitive and neurobehavioral dysfunction   . Hypertensive urgency 01/28/2020  . Urinary retention 01/28/2020  . Right middle cerebral artery stroke ([place]) 01/28/2020  . Stroke (cerebrum) ([place]) -  [name] 01/25/2020  . Acute respiratory failure with hypoxia ([place])   . Hypotension   . Hypokalemia   . Hypocalcemia   . Encephalopathy acute   . Stroke ([place]) 01/24/2020  . Middle cerebral artery embolism, right 01/24/2020  . Hyperlipidemia 02/21/2019  . Impingement syndrome of right shoulder region 02/21/2018  .  OA (osteoarthritis) of hip 01/18/2018  . Prostate cancer (HCC) 03/04/2016  . Essential hypertension 11/27/2014  . History of colonic polyps 09/26/2014  . BACK PAIN, LEFT 12/22/2009  . PROSTATE SPECIFIC ANTIGEN, ELEVATED 12/20/2008  .  LATERAL EPICONDYLITIS 07/15/2008  . ACTINIC KERATOSIS, FOREHEAD, LEFT 05/23/2008  . WART, LEFT HAND 07/19/2007  . ERECTILE DYSFUNCTION, MILD 07/19/2007  . HERNIA, BILATERAL INGUINAL W/O OBST/GANGRENE 07/19/2007  . MENISCUS TEAR 07/19/2007  . Bilateral inguinal hernia 07/19/2007    ,  Johnson, OTR/L 03/31/2020, 12:24 PM  [facility] [address] [place], [place], [address] Phone: [phone]   Fax:  [phone]  Name: Ruben Chavez MRN: [id number] Date of Birth: 05/31/1951 

## 2020-03-31 NOTE — Therapy (Signed)
[facility] [address] [place], [place], [address] Phone: [phone]   Fax:  [phone]  Physical Therapy Treatment  Patient Details  Name: Ruben Chavez MRN: [id number] Date of Birth: 1951/11/10 Referring Provider (PT): Lauraine Rinne, PA-C (pt will be following up with Dr. Dagoberto Ligas)   Encounter Date: 03/31/2020  PT End of Session - 03/31/20 1152    Visit Number  6    Number of Visits  17    Date for PT Re-Evaluation  05/15/20   written for 60 day POC   Authorization Type  [employer]    PT Start Time  0934    PT Stop Time  1014    PT Time Calculation (min)  40 min    Equipment Utilized During Treatment  Gait belt    Activity Tolerance  Patient tolerated treatment well    Behavior During Therapy  [facility] for tasks assessed/performed       Past Medical History:  Diagnosis Date  . Elevated PSA   . Hyperlipidemia    pt says not anymore  . Hypertension   . Prostate cancer ([place]) 2013    Past Surgical History:  Procedure Laterality Date  . bilateral inguinal hernia  2008  . BUBBLE STUDY  01/28/2020   Procedure: BUBBLE STUDY;  Surgeon: [name], MD;  [facility];  Service: Cardiovascular;;  . CYST REMOVAL TRUNK  2016   sebaceous cyst  . CYSTOSCOPY  04/22/2016   Procedure: CYSTOSCOPY;  Surgeon: [name], MD;  Location: [facility];  Service: Urology;;  . IR ANGIO VERTEBRAL SEL SUBCLAVIAN INNOMINATE UNI R MOD SED  01/25/2020  . IR CT HEAD LTD  01/25/2020  . IR PERCUTANEOUS ART THROMBECTOMY/INFUSION INTRACRANIAL INC DIAG ANGIO  01/25/2020  . KNEE ARTHROSCOPY  2007   right  . LOOP RECORDER INSERTION N/A 01/28/2020   Procedure: LOOP RECORDER INSERTION;  Surgeon: [name], MD;  Location: [facility];  Service: Cardiovascular;  Laterality: N/A;  . PROSTATE BIOPSY  2008  . PROSTATE BIOPSY  2013  . PROSTATE BIOPSY  01/28/2016  . RADIOACTIVE SEED IMPLANT  N/A 04/22/2016   Procedure: RADIOACTIVE SEED IMPLANT/BRACHYTHERAPY IMPLANT;  Surgeon: [name], MD;  Location: [facility];  Service: Urology;  Laterality: N/A;  . RADIOLOGY WITH ANESTHESIA N/A 01/24/2020   Procedure: IR WITH ANESTHESIA;  Surgeon: [name], MD;  Location: [facility];  Service: Radiology;  Laterality: N/A;  . TEE WITHOUT CARDIOVERSION N/A 01/28/2020   Procedure: TRANSESOPHAGEAL ECHOCARDIOGRAM (TEE);  Surgeon: [name], MD;  Location: [facility] END[facility]COPY;  Service: Cardiovascular;  Laterality: N/A;  . TONSILLECTOMY    . TOTAL HIP ARTHROPLASTY  2001   left  . TOTAL HIP ARTHROPLASTY Right 01/18/2018   Procedure: RIGHT TOTAL HIP ARTHROPLASTY ANTERIOR APPROACH;  Surgeon: [name], MD;  Location: [facility];  Service: Orthopedics;  Laterality: Right;    There were no vitals filed for this visit.  Subjective Assessment - 03/31/20 0938    Subjective  Pt unsure how to work Pharmacist, community and view his visit summaries. Still has to be careful at times going up and down stairs.    Patient is accompained by:  Family member   wife katherine   Pertinent History  hyperlipidemia, hypertension, prostate cancer with radioactive seed implant, remote tobacco abuse.    Diagnostic tests  Cranial CT scan showed subacute infarction right lateral temporal lobe    Patient Stated Goals  wants to  get left hand and arm working, wants to improve his balance and changing direction (per wife).    Currently in Pain?  No/denies                       [facility] Adult PT Treatment/Exercise - 03/31/20 0001      Neuro Re-ed    Neuro Re-ed Details   On blue mat next to countertop: slow marching for slow SLS with cues for increased hip/knee flexion and holding for a couple seconds in the air before lowering min guard especially when standing on LLE down and back 4 reps, alternating step over step pattern with 3 taller hurdles down and back 3 reps, with blue mat double folded: 10  reps stepping over taller hurdle laterally with RLE for incr stance/weight bearing through LLE, 1 x 5 reps forward stepping over smaller hurdle with RLE, 1 x 10 reps forward stepping with RLE over taller hurdle (pt with incr difficulty with first couple reps knocking down hurdle, but improved with incr reps)      Exercises   Exercises  Other Exercises    Other Exercises   At staircase: lateral step ups x5 reps, forward step ups with LLE with single UE support x10 reps, attempted some with no UE support, however pt demonstrating incr hip weakness and unable to perform due to balance.           Balance Exercises - 03/31/20 1156      Balance Exercises: Standing   Other Standing Exercises  In corner on thicker blue foam: feet hip width distance and eyes closed 2 x 30 seconds, feet hip width 2 x 10 reps head turns, 2 x 10 reps head nods - min guard. Hip strategy x10 reps with cues for proper technique and to shift weight onto toes           PT Short Term Goals - 03/18/20 2109      PT SHORT TERM GOAL #1   Title  Pt will be independent with initial HEP in order to build upon functional gains made in therapy. ALL STGS DUE 04/15/20    Time  4   due to delay in scheduling - 1st return visit 03/18/20   Period  Weeks    Status  New    Target Date  04/15/20      PT SHORT TERM GOAL #2   Title  Pt will improve FGA score to at least a 27/30 in order to indicate decr fall risk.    Baseline  24/30 on 03/18/20    Time  4    Period  Weeks    Status  Revised      PT SHORT TERM GOAL #3   Title  Pt will ambulate at least 300' over level surfaces while scanning environment with supervision and no LOB.    Time  4    Period  Weeks    Status  New      PT SHORT TERM GOAL #4   Title  Pt will decr 5x sit <> stand time to 14 seconds or less in order to indicate improved functional LE strength.    Baseline  16.69 seconds on 02/15/20    Time  4    Period  Weeks    Status  New      PT SHORT TERM GOAL #5    Title  Pt will improve gait speed to at least 2.9 ft/sec in order to  demo improved gait efficiency.    Time  4    Period  Weeks    Status  New        PT Long Term Goals - 03/18/20 2110      PT LONG TERM GOAL #1   Title  Pt will be independent with final HEP in order to build upon functional gains made in therapy. ALL LTGS DUE 05/13/20    Time  8   due to delay in scheduling   Period  Weeks    Status  New    Target Date  05/13/20      PT LONG TERM GOAL #2   Title  Pt will improve FGA score to at least a 29/30 in order to indicate decr fall risk.    Time  4    Period  Weeks    Status  New      PT LONG TERM GOAL #3   Title  30 second chair stand goal to be written as appropriate to demo improved LE strength and endurance.    Time  8    Period  Weeks    Status  New      PT LONG TERM GOAL #4   Title  Pt will improve gait speed to at least 3.4 ft/sec in order to demo improved gait efficiency in the community.    Time  8    Period  Weeks    Status  New      PT LONG TERM GOAL #5   Title  Pt will ambulate at least 500' over unlevel surfaces with mod I and scanning environment in order to improve community mobility.    Time  8    Period  Weeks    Status  New      PT LONG TERM GOAL #6   Title  Pt will perform 12 steps with no handrail and step through pattern with supervision in order to indicate improved functional LE strength.    Time  8    Period  Weeks    Status  New            Plan - 03/31/20 1153    Clinical Impression Statement  Focus of today's skilled session was balance strategies on compliant surfaces and LLE SLS activities. Pt challenged by L SLS during step ups and on compliant surfaces during first couple of reps. When more fatigued, pt noted to have L hip ABD weakness with SLS - will adress in future sessions. Will continue to progress towards LTGs.    Personal Factors and Comorbidities  Comorbidity 3+;Past/Current Experience    Comorbidities   hyperlipidemia, hypertension, prostate cancer with radioactive seed implant, remote tobacco abuse.    Examination-Activity Limitations  Stairs;Locomotion Level    Examination-Participation Restrictions  Community Activity;Driving;Yard Work   being on his boat   Stability/Clinical Decision Making  Stable/Uncomplicated    Rehab Potential  Good    PT Frequency  2x / week    PT Duration  8 weeks    PT Treatment/Interventions  ADLs/Self Care Home Management;Therapeutic activities;Functional mobility training;Stair training;Gait training;Therapeutic exercise;Balance training;Neuromuscular re-education;Patient/family education    PT Next Visit Plan  * hip ABD strengthening on L. continue with high level L SLS activities. high level balance (eyes closed, obstacles/SLS, over compliant surfaces). pt would like to get back to his [place] and be on a boat (states that there are a lot of gravel surfaces and obstacles he would need to step  over)    PT Home Exercise Plan  [id number]       Patient will benefit from skilled therapeutic intervention in order to improve the following deficits and impairments:  Abnormal gait, Decreased activity tolerance, Decreased balance, Decreased safety awareness, Difficulty walking, Decreased strength, Impaired UE functional use  Visit Diagnosis: Other lack of coordination  Unsteadiness on feet  Other symptoms and signs involving the nervous system     Problem List Patient Active Problem List   Diagnosis Date Noted  . Left hemiparesis ([place]) 03/28/2020  . Chronic bilateral low back pain without sciatica 02/25/2020  . Spasticity as late effect of cerebrovascular accident (CVA) 02/25/2020  . Cognitive and neurobehavioral dysfunction   . Hypertensive urgency 01/28/2020  . Urinary retention 01/28/2020  . Right middle cerebral artery stroke ([place]) 01/28/2020  . Stroke (cerebrum) ([place]) -  R MAC infarct due to R MCA occlusion s/p tPA and IR with TICI2b recanalization -  embolic secondary to unknown source 01/25/2020  . Acute respiratory failure with hypoxia ([place])   . Hypotension   . Hypokalemia   . Hypocalcemia   . Encephalopathy acute   . Stroke ([place]) 01/24/2020  . Middle cerebral artery embolism, right 01/24/2020  . Hyperlipidemia 02/21/2019  . Impingement syndrome of right shoulder region 02/21/2018  . OA (osteoarthritis) of hip 01/18/2018  . Prostate cancer ([place]) 03/04/2016  . Essential hypertension 11/27/2014  . History of colonic polyps 09/26/2014  . BACK PAIN, LEFT 12/22/2009  . PROSTATE SPECIFIC ANTIGEN, ELEVATED 12/20/2008  . LATERAL EPICONDYLITIS 07/15/2008  . ACTINIC KERATOSIS, FOREHEAD, LEFT 05/23/2008  . WART, LEFT HAND 07/19/2007  . ERECTILE DYSFUNCTION, MILD 07/19/2007  . HERNIA, BILATERAL INGUINAL W/O OBST/GANGRENE 07/19/2007  . MENISCUS TEAR 07/19/2007  . Bilateral inguinal hernia 07/19/2007    Arliss Journey, PT, DPT  03/31/2020, 11:59 AM  [facility] [address] [place], [place], [address] Phone: [phone]   Fax:  [phone]  Name: LADAMIEN RAMMEL MRN: [id number] Date of Birth: 09-03-1951

## 2020-03-31 NOTE — Therapy (Signed)
[employer] [address] [place], [place], [address] Phone: [phone]   Fax:  [phone]  Speech Language Pathology Treatment  Patient Details  Name: Ruben Chavez MRN: [id number] Date of Birth: March 30, 1951 Referring Provider (SLP): Lauraine Rinne, PA-C   Encounter Date: 03/31/2020  End of Session - 03/31/20 1719    Visit Number  7    Number of Visits  17    Date for SLP Re-Evaluation  04/19/20    SLP Start Time  1021    SLP Stop Time   1101    SLP Time Calculation (min)  40 min    Activity Tolerance  Patient tolerated treatment well       Past Medical History:  Diagnosis Date  . Elevated PSA   . Hyperlipidemia    pt says not anymore  . Hypertension   . Prostate cancer ([place]) 2013    Past Surgical History:  Procedure Laterality Date  . bilateral inguinal hernia  2008  . BUBBLE STUDY  01/28/2020   Procedure: BUBBLE STUDY;  Surgeon: [name], MD;  Location: [facility] ENDOSCOPY;  Service: Cardiovascular;;  . CYST REMOVAL TRUNK  2016   sebaceous cyst  . CYSTOSCOPY  04/22/2016   Procedure: CYSTOSCOPY;  Surgeon: [name], MD;  Location: [facility];  Service: Urology;;  . IR ANGIO VERTEBRAL SEL SUBCLAVIAN INNOMINATE UNI R MOD SED  01/25/2020  . IR CT HEAD LTD  01/25/2020  . IR PERCUTANEOUS ART THROMBECTOMY/INFUSION INTRACRANIAL INC DIAG ANGIO  01/25/2020  . KNEE ARTHROSCOPY  2007   right  . LOOP RECORDER INSERTION N/A 01/28/2020   Procedure: LOOP RECORDER INSERTION;  Surgeon: [name], MD;  Location: [facility] CV LAB;  Service: Cardiovascular;  Laterality: N/A;  . PROSTATE BIOPSY  2008  . PROSTATE BIOPSY  2013  . PROSTATE BIOPSY  01/28/2016  . RADIOACTIVE SEED IMPLANT N/A 04/22/2016   Procedure: RADIOACTIVE SEED IMPLANT/BRACHYTHERAPY IMPLANT;  Surgeon: [name], MD;  Location: [facility];  Service: Urology;  Laterality: N/A;  . RADIOLOGY WITH ANESTHESIA  N/A 01/24/2020   Procedure: IR WITH ANESTHESIA;  Surgeon: [name], MD;  Location: [facility];  Service: Radiology;  Laterality: N/A;  . TEE WITHOUT CARDIOVERSION N/A 01/28/2020   Procedure: TRANSESOPHAGEAL ECHOCARDIOGRAM (TEE);  Surgeon: [name], MD;  Location: [facility] ENDOSCOPY;  Service: Cardiovascular;  Laterality: N/A;  . TONSILLECTOMY    . TOTAL HIP ARTHROPLASTY  2001   left  . TOTAL HIP ARTHROPLASTY Right 01/18/2018   Procedure: RIGHT TOTAL HIP ARTHROPLASTY ANTERIOR APPROACH;  Surgeon: [name], MD;  Location: [facility];  Service: Orthopedics;  Laterality: Right;    There were no vitals filed for this visit.  Subjective Assessment - 03/31/20 1028    Subjective  "Do you know how I can see all thes after visit summaries that I get  notifications aobut?"    Currently in Pain?  No/denies            ADULT SLP TREATMENT - 03/31/20 1032      General Information   Behavior/Cognition  Alert;Cooperative;Pleasant mood;Impulsive;Requires cueing;Other (comment)   poor frustration tolerance     Treatment Provided   Treatment provided  Cognitive-Linquistic      Cognitive-Linquistic Treatment   Treatment focused on  Cognition    Skilled Treatment  Brought a leather binder today (not three-ring) with sticky tabs for marking beginning points of papers today; pt found PT exercises and found ST homework  with minimal time. Given pt "s" statement, SLP collaborated with pt to go on MyChart and see his notifications/summaries he inquired about. Pt had to reset password. SLP and pt looked at [place] together - pt noted to take very random approach at problem solving where to find visit summaries. Alternating attention was limited for this detailed task. Pt required min-mod A consistently to slow down and read all details for instrctions to do so. Pt explaining away deficits consistently with "I don't have the patience to wait like this.", "It should be obvious. They have geniuses working on  these apps it should be very apparent.", etc. SLP highlighted to pt that his lack of patience may be heightened post-CVA and pt stated, "I've never been paitent."      Assessment / Recommendations / [place] with current plan of care;Goals updated      Progression Toward Goals   Progression toward goals  Not progressing toward goals (comment)   severity of deficit      SLP Education - 03/31/20 1717    Education Details  post-CVA reduced frustration tolerance and heightened lack of patience can be common    Person(s) Educated  Patient    Methods  Explanation;Demonstration    Comprehension  Need further instruction       SLP Short Term Goals - 03/31/20 1720      SLP SHORT TERM GOAL #1   Title  Patient will use compensatory aids/strategies (electronic or paper) with occasional cues from spouse to be on time for appointments x3 sessions    Baseline  03-24-20, 03-26-20    Status  Partially Met      SLP SHORT TERM GOAL #2   Title  Patient will complete standardized cognitive testing within the first 3 sessions.    Status  Achieved      SLP SHORT TERM GOAL #3   Title  Patient will demonstrate appropriate topic maintenance over 5 minute conversation with occasional min cues x2 sessions.    Status  Not Met      SLP SHORT TERM GOAL #4   Title  pt will demo selective attention adequate to complete a 5-minute therapy task in 3 sessions    Baseline  03-24-20    Status  Partially Met      SLP SHORT TERM GOAL #5   Title  pt will demo functional organization in a therapy task, with occasional mod A    Status  Not Met       SLP Long Term Goals - 03/31/20 1721      SLP LONG TERM GOAL #1   Title  Patient will use compensatory aids/strategies (electronic or paper) to manage schedule, appointments, and daily tasks with rare min A x 4 sessions.    Baseline  03-31-20    Time  4    Period  Weeks    Status  On-going      SLP LONG TERM GOAL #2   Title  Pt will ID and correct  errors on financial documents with occasional min A over 3 sessions    Time  4    Period  Weeks    Status  Revised      SLP LONG TERM GOAL #3   Title  Patient will demonstrate appropriate topic maintenance over 6 minute conversation with modified independence x 3 sessions.    Time  4    Period  Weeks    Status  Revised  SLP LONG TERM GOAL #4   Title  pt will demo selective attention adequate for completing a simple to mod-complex 10-minute task, over three sessions    Time  4    Period  Weeks    Status  Revised       Plan - 03/31/20 1719    Clinical Impression Statement  Ruben Chavez presents with, functionally, moderate cognitive communication deficits. Pt demonstrated reduced awareness/insight into his deficits and poor frustration tolerance today in session. See "skilled intervention" for more details. I recommend cont'd skilled ST to address cognitive communication impairment to maximize safety and independence at home.    Speech Therapy Frequency  2x / week    Duration  --   8 weeks or 17 visits   Treatment/Interventions  Environmental controls;SLP instruction and feedback;Cueing hierarchy;Compensatory techniques;Cognitive reorganization;Functional tasks;Compensatory strategies;Internal/external aids;Multimodal communcation approach;Patient/family education    Potential to Achieve Goals  Good    Consulted and Agree with Plan of Care  Patient;Family member/caregiver       Patient will benefit from skilled therapeutic intervention in order to improve the following deficits and impairments:   Cognitive communication deficit  Dysarthria and anarthria    Problem List Patient Active Problem List   Diagnosis Date Noted  . Left hemiparesis ([place]) 03/28/2020  . Chronic bilateral low back pain without sciatica 02/25/2020  . Spasticity as late effect of cerebrovascular accident (CVA) 02/25/2020  . Cognitive and neurobehavioral dysfunction   . Hypertensive urgency  01/28/2020  . Urinary retention 01/28/2020  . Right middle cerebral artery stroke ([place]) 01/28/2020  . Stroke (cerebrum) ([place]) -  [name] 01/25/2020  . Acute respiratory failure with hypoxia ([place])   . Hypotension   . Hypokalemia   . Hypocalcemia   . Encephalopathy acute   . Stroke ([place]) 01/24/2020  . Middle cerebral artery embolism, right 01/24/2020  . Hyperlipidemia 02/21/2019  . Impingement syndrome of right shoulder region 02/21/2018  . OA (osteoarthritis) of hip 01/18/2018  . Prostate cancer ([place]) 03/04/2016  . Essential hypertension 11/27/2014  . History of colonic polyps 09/26/2014  . BACK PAIN, LEFT 12/22/2009  . PROSTATE SPECIFIC ANTIGEN, ELEVATED 12/20/2008  . LATERAL EPICONDYLITIS 07/15/2008  . ACTINIC KERATOSIS, FOREHEAD, LEFT 05/23/2008  . WART, LEFT HAND 07/19/2007  . ERECTILE DYSFUNCTION, MILD 07/19/2007  . HERNIA, BILATERAL INGUINAL W/O OBST/GANGRENE 07/19/2007  . MENISCUS TEAR 07/19/2007  . Bilateral inguinal hernia 07/19/2007    [facility] ,MS, CCC-SLP  03/31/2020, 5:22 PM  [facility] [address] [place], [place], [address] Phone: [phone]   Fax:  [phone]   Name: Ruben Chavez MRN: [id number] Date of Birth: [birth year]

## 2020-04-03 ENCOUNTER — Ambulatory Visit: Payer: PPO | Admitting: Occupational Therapy

## 2020-04-03 ENCOUNTER — Ambulatory Visit: Payer: PPO | Admitting: Physical Therapy

## 2020-04-03 ENCOUNTER — Other Ambulatory Visit: Payer: Self-pay

## 2020-04-03 ENCOUNTER — Encounter: Payer: Self-pay | Admitting: Physical Therapy

## 2020-04-03 ENCOUNTER — Ambulatory Visit: Payer: PPO | Admitting: Speech Pathology

## 2020-04-03 DIAGNOSIS — I69354 Hemiplegia and hemiparesis following cerebral infarction affecting left non-dominant side: Secondary | ICD-10-CM

## 2020-04-03 DIAGNOSIS — R208 Other disturbances of skin sensation: Secondary | ICD-10-CM

## 2020-04-03 DIAGNOSIS — R278 Other lack of coordination: Secondary | ICD-10-CM

## 2020-04-03 DIAGNOSIS — R41840 Attention and concentration deficit: Secondary | ICD-10-CM

## 2020-04-03 DIAGNOSIS — R41841 Cognitive communication deficit: Secondary | ICD-10-CM

## 2020-04-03 NOTE — Therapy (Signed)
[facility] [address] [place], [place], [address] Phone: [phone]   Fax:  [phone]  Speech Language Pathology Treatment  Patient Details  Name: Ruben Chavez MRN: [id number] Date of Birth: 12/24/1950 Referring Provider (SLP): Lauraine Rinne, PA-C   Encounter Date: 04/03/2020  End of Session - 04/03/20 1354    Visit Number  8    Number of Visits  17    Date for SLP Re-Evaluation  04/19/20    Authorization Type  one copay for multiple disciplines if same day    SLP Start Time  1147    SLP Stop Time   1230    SLP Time Calculation (min)  43 min    Activity Tolerance  Patient tolerated treatment well       Past Medical History:  Diagnosis Date  . Elevated PSA   . Hyperlipidemia    pt says not anymore  . Hypertension   . Prostate cancer ([place]) 2013    Past Surgical History:  Procedure Laterality Date  . bilateral inguinal hernia  2008  . BUBBLE STUDY  01/28/2020   Procedure: BUBBLE STUDY;  Surgeon: [name], MD;  Location: [facility] ENDOSCOPY;  Service: Cardiovascular;;  . CYST REMOVAL TRUNK  2016   sebaceous cyst  . CYSTOSCOPY  04/22/2016   Procedure: CYSTOSCOPY;  Surgeon: [name], MD;  Location: [facility];  Service: Urology;;  . IR ANGIO VERTEBRAL SEL SUBCLAVIAN INNOMINATE UNI R MOD SED  01/25/2020  . IR CT HEAD LTD  01/25/2020  . IR PERCUTANEOUS ART THROMBECTOMY/INFUSION INTRACRANIAL INC DIAG ANGIO  01/25/2020  . KNEE ARTHROSCOPY  2007   right  . LOOP RECORDER INSERTION N/A 01/28/2020   Procedure: LOOP RECORDER INSERTION;  Surgeon: [name], MD;  Location: [facility];  Service: Cardiovascular;  Laterality: N/A;  . PROSTATE BIOPSY  2008  . PROSTATE BIOPSY  2013  . PROSTATE BIOPSY  01/28/2016  . RADIOACTIVE SEED IMPLANT N/A 04/22/2016   Procedure: RADIOACTIVE SEED IMPLANT/BRACHYTHERAPY IMPLANT;  Surgeon: [name], MD;  Location: [facility];  Service: Urology;  Laterality: N/A;  . RADIOLOGY WITH ANESTHESIA N/A 01/24/2020   Procedure: IR WITH ANESTHESIA;  Surgeon: [name], MD;  Location: [facility];  Service: Radiology;  Laterality: N/A;  . TEE WITHOUT CARDIOVERSION N/A 01/28/2020   Procedure: TRANSESOPHAGEAL ECHOCARDIOGRAM (TEE);  Surgeon: [name], MD;  Location: [facility] ENDOSCOPY;  Service: Cardiovascular;  Laterality: N/A;  . TONSILLECTOMY    . TOTAL HIP ARTHROPLASTY  2001   left  . TOTAL HIP ARTHROPLASTY Right 01/18/2018   Procedure: RIGHT TOTAL HIP ARTHROPLASTY ANTERIOR APPROACH;  Surgeon: [name], MD;  Location: [facility];  Service: Orthopedics;  Laterality: Right;    There were no vitals filed for this visit.  Subjective Assessment - 04/03/20 1149    Subjective  Pt went into dark, empty treatment room    Currently in Pain?  No/denies            ADULT SLP TREATMENT - 04/03/20 1150      General Information   Behavior/Cognition  Alert;Cooperative;Pleasant mood;Impulsive;Requires cueing;Other (comment)      Treatment Provided   Treatment provided  Cognitive-Linquistic      Pain Assessment   Pain Assessment  No/denies pain      Cognitive-Linquistic Treatment   Treatment focused on  Cognition    Skilled Treatment  Patient did not complete homework: "I didn't even look at it." [employer]  binder was somewhat disorganized; pt threw away copay receipts, "Don't tell my wife." SLP suggested pt could make a section in his notebook for these so that he could save them for his wife, and pt declined. In simple reasoning/problem solving tasks, pt with 0% error awareness; at times argumentative/explaining away deficits when confronted with errors but ultimately acknowledged with mod cues. When therapist told pt he should slow down and double check himself, pt agreed and said, "I know, you and (other therapist) keep telling me that." Patient reluctant to complete cognitive activities daily; he reserves Sunday for  completing ST homework. SLP educated pt that more frequent practice at home is advised if pt hopes to make functional gains. Home tasks assigned.       Assessment / Recommendations / Plan   Plan  Continue with current plan of care;Goals updated      Progression Toward Goals   Progression toward goals  Not progressing toward goals (comment)   severity of deficits, awareness        SLP Short Term Goals - 04/03/20 1355      SLP SHORT TERM GOAL #1   Title  Patient will use compensatory aids/strategies (electronic or paper) with occasional cues from spouse to be on time for appointments x3 sessions    Baseline  03-24-20, 03-26-20    Status  Partially Met      SLP SHORT TERM GOAL #2   Title  Patient will complete standardized cognitive testing within the first 3 sessions.    Status  Achieved      SLP SHORT TERM GOAL #3   Title  Patient will demonstrate appropriate topic maintenance over 5 minute conversation with occasional min cues x2 sessions.    Status  Not Met      SLP SHORT TERM GOAL #4   Title  pt will demo selective attention adequate to complete a 5-minute therapy task in 3 sessions    Baseline  03-24-20    Status  Partially Met      SLP SHORT TERM GOAL #5   Title  pt will demo functional organization in a therapy task, with occasional mod A    Status  Not Met       SLP Long Term Goals - 04/03/20 1355      SLP LONG TERM GOAL #1   Title  Patient will use compensatory aids/strategies (electronic or paper) to manage schedule, appointments, and daily tasks with rare min A x 4 sessions.    Baseline  03-31-20    Time  4    Period  Weeks    Status  On-going      SLP LONG TERM GOAL #2   Title  Pt will ID and correct errors on financial documents with occasional min A over 3 sessions    Time  4    Period  Weeks    Status  On-going      SLP LONG TERM GOAL #3   Title  Patient will demonstrate appropriate topic maintenance over 6 minute conversation with modified independence  x 3 sessions.    Time  4    Period  Weeks    Status  On-going      SLP LONG TERM GOAL #4   Title  pt will demo selective attention adequate for completing a simple to mod-complex 10-minute task, over three sessions    Time  4    Period  Weeks    Status  On-going  Plan - 04/03/20 1354    Clinical Impression Statement  Ruben Chavez presents with, functionally, moderate cognitive communication deficits. Pt demonstrated reduced awareness/insight into his deficits and poor frustration tolerance today in session. See "skilled intervention" for more details. May consider hold in therapy if awareness does not improve. I recommend cont'd skilled ST to address cognitive communication impairment to maximize safety and independence at home.    Speech Therapy Frequency  2x / week    Duration  --   8 weeks or 17 visits   Treatment/Interventions  Environmental controls;SLP instruction and feedback;Cueing hierarchy;Compensatory techniques;Cognitive reorganization;Functional tasks;Compensatory strategies;Internal/external aids;Multimodal communcation approach;Patient/family education    Potential to Achieve Goals  Good    Consulted and Agree with Plan of Care  Patient;Family member/caregiver       Patient will benefit from skilled therapeutic intervention in order to improve the following deficits and impairments:   Cognitive communication deficit    Problem List Patient Active Problem List   Diagnosis Date Noted  . Left hemiparesis ([place]) 03/28/2020  . Chronic bilateral low back pain without sciatica 02/25/2020  . Spasticity as late effect of cerebrovascular accident (CVA) 02/25/2020  . Cognitive and neurobehavioral dysfunction   . Hypertensive urgency 01/28/2020  . Urinary retention 01/28/2020  . 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 Stroke (cerebrum) ([place]) -  R MAC infarct due to R MCA occlusion s/p tPA and IR with TICI2b recanalization - embolic secondary to  unknown source 01/25/2020  . Acute respiratory failure with hypoxia ([place])   . Hypotension   . Hypokalemia   . Hypocalcemia   . Encephalopathy acute   . Stroke ([place]) 01/24/2020  . Middle cerebral artery embolism, right 01/24/2020  . Hyperlipidemia 02/21/2019  . Impingement syndrome of right shoulder region 02/21/2018  . OA (osteoarthritis) of hip 01/18/2018  . Prostate cancer ([place]) 03/04/2016  . Essential hypertension 11/27/2014  . History of colonic polyps 09/26/2014  . BACK PAIN, LEFT 12/22/2009  . PROSTATE SPECIFIC ANTIGEN, ELEVATED 12/20/2008  . LATERAL EPICONDYLITIS 07/15/2008  . ACTINIC KERATOSIS, FOREHEAD, LEFT 05/23/2008  . WART, LEFT HAND 07/19/2007  . ERECTILE DYSFUNCTION, MILD 07/19/2007  . HERNIA, BILATERAL INGUINAL W/O OBST/GANGRENE 07/19/2007  . MENISCUS TEAR 07/19/2007  . Bilateral inguinal hernia 07/19/2007   Deneise Lever, Rod[place], [place] 04/03/2020, 1:56 PM  [facility] [address] [place], [place], [address] Phone: [phone]   Fax:  [phone]   Name: Ruben Chavez MRN: [id number] Date of Birth: Nov 05, 1951

## 2020-04-03 NOTE — Therapy (Signed)
[facility] [address] [place], [place], [address] Phone: [phone]   Fax:  [phone]  Physical Therapy Treatment  Patient Details  Name: Ruben Chavez MRN: [id number] Date of Birth: March 24, 1951 Referring Provider (PT): Lauraine Rinne, PA-C (pt will be following up with Dr. Dagoberto Ligas)   Encounter Date: 04/03/2020  PT End of Session - 04/03/20 1226    Visit Number  7    Number of Visits  17    Date for PT Re-Evaluation  05/15/20   written for 60 day POC   Authorization Type  [employer]    PT Start Time  1103    PT Stop Time  1143    PT Time Calculation (min)  40 min    Activity Tolerance  Patient tolerated treatment well    Behavior During Therapy  [facility] for tasks assessed/performed       Past Medical History:  Diagnosis Date  . Elevated PSA   . Hyperlipidemia    pt says not anymore  . Hypertension   . Prostate cancer ([place]) 2013    Past Surgical History:  Procedure Laterality Date  . bilateral inguinal hernia  2008  . BUBBLE STUDY  01/28/2020   Procedure: BUBBLE STUDY;  Surgeon: [name], MD;  Location: [facility] ENDOSCOPY;  Service: Cardiovascular;;  . CYST REMOVAL TRUNK  2016   sebaceous cyst  . CYSTOSCOPY  04/22/2016   Procedure: CYSTOSCOPY;  Surgeon: [name], MD;  Location: [facility];  Service: Urology;;  . IR ANGIO VERTEBRAL SEL SUBCLAVIAN INNOMINATE UNI R MOD SED  01/25/2020  . IR CT HEAD LTD  01/25/2020  . IR PERCUTANEOUS ART THROMBECTOMY/INFUSION INTRACRANIAL INC DIAG ANGIO  01/25/2020  . KNEE ARTHROSCOPY  2007   right  . LOOP RECORDER INSERTION N/A 01/28/2020   Procedure: LOOP RECORDER INSERTION;  Surgeon: [name], MD;  Location: [facility] CV LAB;  Service: Cardiovascular;  Laterality: N/A;  . PROSTATE BIOPSY  2008  . PROSTATE BIOPSY  2013  . PROSTATE BIOPSY  01/28/2016  . RADIOACTIVE SEED IMPLANT N/A 04/22/2016   Procedure: RADIOACTIVE SEED  IMPLANT/BRACHYTHERAPY IMPLANT;  Surgeon: [name], MD;  Location: [facility];  Service: Urology;  Laterality: N/A;  . RADIOLOGY WITH ANESTHESIA N/A 01/24/2020   Procedure: IR WITH ANESTHESIA;  Surgeon: [name], MD;  Location: [facility];  Service: Radiology;  Laterality: N/A;  . TEE WITHOUT CARDIOVERSION N/A 01/28/2020   Procedure: TRANSESOPHAGEAL ECHOCARDIOGRAM (TEE);  Surgeon: [name], MD;  Location: [facility] ENDOSCOPY;  Service: Cardiovascular;  Laterality: N/A;  . TONSILLECTOMY    . TOTAL HIP ARTHROPLASTY  2001   left  . TOTAL HIP ARTHROPLASTY Right 01/18/2018   Procedure: RIGHT TOTAL HIP ARTHROPLASTY ANTERIOR APPROACH;  Surgeon: [name], MD;  Location: [facility];  Service: Orthopedics;  Laterality: Right;    There were no vitals filed for this visit.  Subjective Assessment - 04/03/20 1105    Subjective  No changes, things are going well.    Patient is accompained by:  Family member   wife katherine   Pertinent History  hyperlipidemia, hypertension, prostate cancer with radioactive seed implant, remote tobacco abuse.    Diagnostic tests  Cranial CT scan showed subacute infarction right lateral temporal lobe    Patient Stated Goals  wants to get left hand and arm working, wants to improve his balance and changing direction (per wife).    Currently in Pain?  No/denies  [facility] Adult PT Treatment/Exercise - 04/03/20 0001      Exercises   Exercises  Other Exercises    Other Exercises   L clamshells: first 10 reps with no resistance, performed an additional 10 reps with red theraband, verbal and tactile cues for technique, Bridging x6 reps - with verbal and tactile cues first to lift with L ASIS for incr LLE activation, attempted SLS bridging with RLE marching but pt reporting feeling it too much in his back. On longer black step next to counter: lateral step ups to R with floating LLE x10 reps (beginning with single UE  support and progressing to no UE support), performed same exercise on L for closed chain hip ABD strengthening x10 reps with single UE support, performed an additional 5 reps while once in SLS pt floating his hands away from countertop for balance, with pt having noted incr hip drop. Forward step ups with LLE on step x10 reps with marching RLE, x10 reps plus an additional 3 reps with no UE support with min guard/min A for balance. With red theraband around ankles side stepping down and back at countertop x5 reps with cues for mini squat technique and taking larger steps for hip ABD strengthening.                PT Short Term Goals - 03/18/20 2109      PT SHORT TERM GOAL #1   Title  Pt will be independent with initial HEP in order to build upon functional gains made in therapy. ALL STGS DUE 04/15/20    Time  4   due to delay in scheduling - 1st return visit 03/18/20   Period  Weeks    Status  New    Target Date  04/15/20      PT SHORT TERM GOAL #2   Title  Pt will improve FGA score to at least a 27/30 in order to indicate decr fall risk.    Baseline  24/30 on 03/18/20    Time  4    Period  Weeks    Status  Revised      PT SHORT TERM GOAL #3   Title  Pt will ambulate at least 300' over level surfaces while scanning environment with supervision and no LOB.    Time  4    Period  Weeks    Status  New      PT SHORT TERM GOAL #4   Title  Pt will decr 5x sit <> stand time to 14 seconds or less in order to indicate improved functional LE strength.    Baseline  16.69 seconds on 02/15/20    Time  4    Period  Weeks    Status  New      PT SHORT TERM GOAL #5   Title  Pt will improve gait speed to at least 2.9 ft/sec in order to demo improved gait efficiency.    Time  4    Period  Weeks    Status  New        PT Long Term Goals - 03/18/20 2110      PT LONG TERM GOAL #1   Title  Pt will be independent with final HEP in order to build upon functional gains made in therapy. ALL LTGS  DUE 05/13/20    Time  8   due to delay in scheduling   Period  Weeks    Status  New    Target Date  05/13/20      PT LONG TERM GOAL #2   Title  Pt will improve FGA score to at least a 29/30 in order to indicate decr fall risk.    Time  4    Period  Weeks    Status  New      PT LONG TERM GOAL #3   Title  30 second chair stand goal to be written as appropriate to demo improved LE strength and endurance.    Time  8    Period  Weeks    Status  New      PT LONG TERM GOAL #4   Title  Pt will improve gait speed to at least 3.4 ft/sec in order to demo improved gait efficiency in the community.    Time  8    Period  Weeks    Status  New      PT LONG TERM GOAL #5   Title  Pt will ambulate at least 500' over unlevel surfaces with mod I and scanning environment in order to improve community mobility.    Time  8    Period  Weeks    Status  New      PT LONG TERM GOAL #6   Title  Pt will perform 12 steps with no handrail and step through pattern with supervision in order to indicate improved functional LE strength.    Time  8    Period  Weeks    Status  New            Plan - 04/03/20 1227    Clinical Impression Statement  Focus of today's skilled session was L>RLE strengthening, especially hip ABD. Pt challenged by closed chain hip ABD on LLE, unable to perform without UE support. When attempting a couple reps without UE support, incr in hip drop noted. Pt is progressing well, will continue to progress towards LTGs.    Personal Factors and Comorbidities  Comorbidity 3+;Past/Current Experience    Comorbidities  hyperlipidemia, hypertension, prostate cancer with radioactive seed implant, remote tobacco abuse.    Examination-Activity Limitations  Stairs;Locomotion Level    Examination-Participation Restrictions  Community Activity;Driving;Yard Work   being on his boat   Stability/Clinical Decision Making  Stable/Uncomplicated    Rehab Potential  Good    PT Frequency  2x / week    PT  Duration  8 weeks    PT Treatment/Interventions  ADLs/Self Care Home Management;Therapeutic activities;Functional mobility training;Stair training;Gait training;Therapeutic exercise;Balance training;Neuromuscular re-education;Patient/family education    PT Next Visit Plan  add functional strengthening to HEP. * hip ABD strengthening on L. continue with high level L SLS activities. high level balance (eyes closed, obstacles/SLS, over compliant surfaces). [place] (states that there are a lot of gravel surfaces and obstacles he would need to step over)    PT Home Exercise Plan  [id number]       Patient will benefit from skilled therapeutic intervention in order to improve the following deficits and impairments:  Abnormal gait, Decreased activity tolerance, Decreased balance, Decreased safety awareness, Difficulty walking, Decreased strength, Impaired UE functional use  Visit Diagnosis: Hemiplegia and hemiparesis following cerebral infarction affecting left non-dominant side (HCC)  Other lack of coordination     Problem List Patient Active Problem List   Diagnosis Date Noted  . Left hemiparesis ([place]) 03/28/2020  . Chronic bilateral low back pain without sciatica 02/25/2020  . Spasticity  as late effect of cerebrovascular accident (CVA) 02/25/2020  . Cognitive and neurobehavioral dysfunction   . Hypertensive urgency 01/28/2020  . Urinary retention 01/28/2020  . Right middle cerebral artery stroke ([place]) 01/28/2020  . Stroke (cerebrum) ([place]) -  [name] 01/25/2020  . Acute respiratory failure with hypoxia ([place])   . Hypotension   . Hypokalemia   . Hypocalcemia   . Encephalopathy acute   . Stroke ([place]) 01/24/2020  . Middle cerebral artery embolism, right 01/24/2020  . Hyperlipidemia 02/21/2019  . Impingement syndrome of right shoulder  region 02/21/2018  . OA (osteoarthritis) of hip 01/18/2018  . Prostate cancer ([place]) 03/04/2016  . Essential hypertension 11/27/2014  . History of colonic polyps 09/26/2014  . BACK PAIN, LEFT 12/22/2009  . PROSTATE SPECIFIC ANTIGEN, ELEVATED 12/20/2008  . LATERAL EPICONDYLITIS 07/15/2008  . ACTINIC KERATOSIS, FOREHEAD, LEFT 05/23/2008  . WART, LEFT HAND 07/19/2007  . ERECTILE DYSFUNCTION, MILD 07/19/2007  . HERNIA, BILATERAL INGUINAL W/O OBST/GANGRENE 07/19/2007  . MENISCUS TEAR 07/19/2007  . Bilateral inguinal hernia 07/19/2007    Arliss Journey, PT, DPT  04/03/2020, 12:32 PM  [facility] [address] [place], [place], [address] Phone: [phone]   Fax:  [phone]  Name: Ruben Chavez MRN: [id number] Date of Birth: 09/17/1951

## 2020-04-03 NOTE — Therapy (Signed)
[facility] [address] [place], [place], [address] Phone: [phone]   Fax:  [phone]  Occupational Therapy Treatment  Patient Details  Name: Ruben Chavez MRN: [id number] Date of Birth: 06/28/1951 Referring Provider (OT): Lauraine Rinne, PA-C   Encounter Date: 04/03/2020  OT End of Session - 04/03/20 1339    Visit Number  11    Number of Visits  17    Date for OT Re-Evaluation  04/22/20    Authorization Type  [employer] (follow Medicare)--1 copay for multiple visits    Authorization Time Period  cert date 8/76/81-1/57/26    Authorization - Visit Number  11    Authorization - Number of Visits  20    Progress Note Due on Visit  20    OT Start Time  1230    OT Stop Time  1315    OT Time Calculation (min)  45 min    Activity Tolerance  Patient tolerated treatment well    Behavior During Therapy  [facility] for tasks assessed/performed       Past Medical History:  Diagnosis Date  . Elevated PSA   . Hyperlipidemia    pt says not anymore  . Hypertension   . Prostate cancer ([place]) 2013    Past Surgical History:  Procedure Laterality Date  . bilateral inguinal hernia  2008  . BUBBLE STUDY  01/28/2020   Procedure: BUBBLE STUDY;  Surgeon: [name], MD;  Location: [facility] ENDOSCOPY;  Service: Cardiovascular;;  . CYST REMOVAL TRUNK  2016   sebaceous cyst  . CYSTOSCOPY  04/22/2016   Procedure: CYSTOSCOPY;  Surgeon: [name], MD;  Location: [facility];  Service: Urology;;  . IR ANGIO VERTEBRAL SEL SUBCLAVIAN INNOMINATE UNI R MOD SED  01/25/2020  . IR CT HEAD LTD  01/25/2020  . IR PERCUTANEOUS ART THROMBECTOMY/INFUSION INTRACRANIAL INC DIAG ANGIO  01/25/2020  . KNEE ARTHROSCOPY  2007   right  . LOOP RECORDER INSERTION N/A 01/28/2020   Procedure: LOOP RECORDER INSERTION;  Surgeon: [name], MD;  Location: [place] CV LAB;  Service: Cardiovascular;  Laterality: N/A;  . PROSTATE BIOPSY   2008  . PROSTATE BIOPSY  2013  . PROSTATE BIOPSY  01/28/2016  . RADIOACTIVE SEED IMPLANT N/A 04/22/2016   Procedure: RADIOACTIVE SEED IMPLANT/BRACHYTHERAPY IMPLANT;  Surgeon: [name], MD;  Location: [facility];  Service: Urology;  Laterality: N/A;  . RADIOLOGY WITH ANESTHESIA N/A 01/24/2020   Procedure: IR WITH ANESTHESIA;  Surgeon: [name], MD;  Location: [facility];  Service: Radiology;  Laterality: N/A;  . TEE WITHOUT CARDIOVERSION N/A 01/28/2020   Procedure: TRANSESOPHAGEAL ECHOCARDIOGRAM (TEE);  Surgeon: [name], MD;  Location: [facility] ENDOSCOPY;  Service: Cardiovascular;  Laterality: N/A;  . TONSILLECTOMY    . TOTAL HIP ARTHROPLASTY  2001   left  . TOTAL HIP ARTHROPLASTY Right 01/18/2018   Procedure: RIGHT TOTAL HIP ARTHROPLASTY ANTERIOR APPROACH;  Surgeon: [name], MD;  Location: [facility];  Service: Orthopedics;  Laterality: Right;    There were no vitals filed for this visit.  Subjective Assessment - 04/03/20 1236    Pertinent History  PMH:  hyperlipidemia, hypertension, prostate cancer radioactive seed implant 2017, R total hip arthroplasty, loop recorder insertion    Limitations  loop recorder    Patient Stated Goals  get my L hand and fingers working    Currently in Pain?  No/denies       Passive stretching to finger flexion  in composite flex, IP flexion followed by extension. Retrograde massage to help with edema management. Pt with decreased edema today Wrist extension x 5 reps 2 sets w/ cues to come up evenly vs. More radially and rest needed after 5 reps. Also worked on finger flexion and extension between sets and after 2nd set by gripping and releasing slightly deflated small ball for sensory and visual feedback.  Pt shown ex to work on thumb palmer abduction by holding water bottle b/t knees and supporting wrist with Lt hand while focusing on thumb palmer abd and add around top of bottle.  AA/ROM in mid range gravity elim plane sh  flexion with focus on forearm control and neutral rotation with mod cueing needed (using ball for control)                      OT Short Term Goals - 03/31/20 1144      OT SHORT TERM GOAL #1   Title  Pt will be independent with initial HEP--check STG 03/24/20    Time  4    Period  Weeks    Status  On-going   03/26/20 continues to need cueing for positioning due to cognitive deficits     OT SHORT TERM GOAL #2   Title  Pt will verbalize understanding of proper positioning of LUE to prevent pain/injury and edema management techniques.    Time  4    Period  Weeks    Status  On-going   03/26/20:  continues to need cueing for proper positioning/techniques     OT SHORT TERM GOAL #3   Title  Pt will be independent with splint wear/care for improved positioning.    Time  4    Period  Weeks    Status  Achieved      OT SHORT TERM GOAL #4   Title  Pt will be able to use LUE as a stabilizer for simple ADL tasks.    Time  4    Period  Weeks    Status  On-going      OT SHORT TERM GOAL #5   Title  Pt will demo at least 25% gross finger flex in prep for functional grasp.    Baseline  4    Time  4    Period  Weeks    Status  Achieved   except index and thumb       OT Long Term Goals - 03/14/20 1209      OT LONG TERM GOAL #1   Title  Pt will be independent with updated HEP.--check LTGs 04/23/20    Time  8    Period  Weeks    Status  New      OT LONG TERM GOAL #2   Title  Pt will be able to use LUE as nondominant assist for ADLs at least 50% of the time.    Time  8    Period  Weeks    Status  New      OT LONG TERM GOAL #3   Title  Pt will be able to score at least 10 on box and blocks test with LUE for improved coordination for ADLs.    Time  8    Period  Weeks    Status  New      OT LONG TERM GOAL #4   Title  Pt will complete clothing fasteners mod I using AE prn.    Time  8    Period  Weeks    Status  New      OT LONG TERM GOAL #5   Title  Pt will demo  improved attention/visual scanning to be able to perform environmental scanning in busy enivornment with at least 95% accuracy for incr safety for community activities/driving    Baseline  03/14/20;  58% initially    Time  8    Period  Weeks    Status  New      OT LONG TERM GOAL #6   Title  Pt will demo at least 75% gross finger flex/ext for grasp/release of objects.    Time  8    Period  Weeks    Status  New            Plan - 04/03/20 1340    Clinical Impression Statement  Pt continues to make progress with Lt wrist and hand getting more consistent wrist extension, however stronger on radial side w/ slight pronation.    Occupational performance deficits (Please refer to evaluation for details):  ADL's;IADL's;Leisure;Social Participation;Work    Marketing executive / Function / Physical Skills  ADL;Dexterity;ROM;IADL;Sensation;Mobility;Strength;FMC;Coordination;Decreased knowledge of precautions;Decreased knowledge of use of DME;Pain;UE functional use;GMC;Proprioception    Cognitive Skills  Attention;Memory;Perception;Safety Awareness    Rehab Potential  Fair    OT Frequency  2x / week    OT Duration  8 weeks    OT Treatment/Interventions  Self-care/ADL training;Moist Heat;Fluidtherapy;DME and/or AE instruction;Splinting;Therapeutic activities;Contrast Bath;Aquatic Therapy;Ultrasound;Therapeutic exercise;Cognitive remediation/compensation;Visual/perceptual remediation/compensation;Passive range of motion;Functional Mobility Training;Neuromuscular education;Cryotherapy;Electrical Stimulation;Paraffin;Energy conservation;Manual Therapy;Patient/family education    Plan  continue Neuro re-educ, NMES to wrist and finger extensors (no longer needed for finger flexion as pt is getting actively at this time!)    Consulted and Agree with Plan of Care  Patient       Patient will benefit from skilled therapeutic intervention in order to improve the following deficits and impairments:   Body Structure  / Function / Physical Skills: ADL, Dexterity, ROM, IADL, Sensation, Mobility, Strength, FMC, Coordination, Decreased knowledge of precautions, Decreased knowledge of use of DME, Pain, UE functional use, GMC, Proprioception Cognitive Skills: Attention, Memory, Perception, Safety Awareness     Visit Diagnosis: Hemiplegia and hemiparesis following cerebral infarction affecting left non-dominant side (HCC)  Other lack of coordination  Attention and concentration deficit  Other disturbances of skin sensation    Problem List Patient Active Problem List   Diagnosis Date Noted  . Left hemiparesis ([place]) 03/28/2020  . Chronic bilateral low back pain without sciatica 02/25/2020  . Spasticity as late effect of cerebrovascular accident (CVA) 02/25/2020  . Cognitive and neurobehavioral dysfunction   . Hypertensive urgency 01/28/2020  . Urinary retention 01/28/2020  . Right middle cerebral artery stroke ([place]) 01/28/2020  . Stroke (cerebrum) ([place]) -  [name] 01/25/2020  . Acute respiratory failure with hypoxia ([place])   . Hypotension   . Hypokalemia   . Hypocalcemia   . Encephalopathy acute   . Stroke ([place]) 01/24/2020  . Middle cerebral artery embolism, right 01/24/2020  . Hyperlipidemia 02/21/2019  . Impingement syndrome of right shoulder region 02/21/2018  . OA (osteoarthritis) of hip 01/18/2018  . Prostate cancer ([place]) 03/04/2016  . Essential hypertension 11/27/2014  . History of colonic polyps 09/26/2014  . BACK PAIN, LEFT 12/22/2009  . PROSTATE SPECIFIC ANTIGEN, ELEVATED 12/20/2008  . LATERAL EPICONDYLITIS 07/15/2008  . ACTINIC  KERATOSIS, FOREHEAD, LEFT 05/23/2008  . WART, LEFT HAND 07/19/2007  . ERECTILE DYSFUNCTION, MILD 07/19/2007  . HERNIA, BILATERAL INGUINAL W/O OBST/GANGRENE 07/19/2007  . MENISCUS TEAR 07/19/2007  . Bilateral inguinal hernia 07/19/2007    Carey Bullocks, OTR/L 04/03/2020, 1:47 PM  [facility] [address] [place], [place], [address] Phone: [phone]   Fax:  [phone]  Name: Ruben Chavez MRN: [id number] Date of Birth: [birth year]

## 2020-04-04 ENCOUNTER — Telehealth: Payer: Self-pay | Admitting: Internal Medicine

## 2020-04-04 ENCOUNTER — Other Ambulatory Visit (HOSPITAL_COMMUNITY): Payer: Self-pay | Admitting: Interventional Radiology

## 2020-04-04 DIAGNOSIS — I639 Cerebral infarction, unspecified: Secondary | ICD-10-CM

## 2020-04-04 NOTE — Telephone Encounter (Signed)
Left message on machine for patient to return our call 

## 2020-04-04 NOTE — Telephone Encounter (Signed)
yes

## 2020-04-04 NOTE — Telephone Encounter (Signed)
Patient wants to know if it is okay for him to take 262 units of magnisum daily along with his medication and krill oil gel tablets.  Please advise

## 2020-04-06 ENCOUNTER — Other Ambulatory Visit: Payer: Self-pay | Admitting: Internal Medicine

## 2020-04-07 ENCOUNTER — Other Ambulatory Visit: Payer: Self-pay | Admitting: Internal Medicine

## 2020-04-07 ENCOUNTER — Telehealth: Payer: Self-pay | Admitting: Emergency Medicine

## 2020-04-07 ENCOUNTER — Ambulatory Visit (INDEPENDENT_AMBULATORY_CARE_PROVIDER_SITE_OTHER): Payer: PPO | Admitting: *Deleted

## 2020-04-07 DIAGNOSIS — I63511 Cerebral infarction due to unspecified occlusion or stenosis of right middle cerebral artery: Secondary | ICD-10-CM

## 2020-04-07 LAB — CUP PACEART REMOTE DEVICE CHECK
Date Time Interrogation Session: 20210428092043
Implantable Pulse Generator Implant Date: 20210222

## 2020-04-07 NOTE — Telephone Encounter (Signed)
Patient presented at office insisting he has appointment . Remote appointment on schedule for LINQ II report. Spoke with patient in the lobby . He reports he understands that the loop transmission happens automatically from home . Reports he has appointment with Dr Johnsie Cancel and we have our schedule incorrect. Reports he has his myChart print out in the car and went to get print out. Wife reports patient gets confused about appointment times and that she has had memory issues and gets agitated when he gets information and appointments confused. Patient returned with print out and remote transmission listed  for today. I explained he was no scheduled appointment to see Dr Johnsie Cancel and has no future ap[pointments with him at this time. The patient reported he knew that , he insisted he has an appointment to get his loop recorder checked. Patient was a no show for his loop recorder wound check. Education done on remote transmissions and loop recorder reports. Patient upset with the fact that he is not being seen for an appointment.  Explained process of remotes again and results of 04/07/20 with patient. Wife present for education and was apologetic for his comments and confusion. Reassured her that it was no problem and to contact us if she has any questions, wife on [place].

## 2020-04-07 NOTE — Telephone Encounter (Signed)
Attempted to contact patient. No answer. LVM for patient to return call.  

## 2020-04-07 NOTE — Progress Notes (Signed)
Carelink Summary Report / Loop Recorder 

## 2020-04-07 NOTE — Telephone Encounter (Signed)
Patient returned Rachel's call. I told the patient that I will have someone from clinical to give him a call back.  Please advise

## 2020-04-07 NOTE — Telephone Encounter (Signed)
Patient returned call. Informed heim per Dr. Jerilee Hoh that yes it is okay for him to take 262 units of magnisum daily along with his medication and krill oil gel tablets. Patient verbalized understanding.

## 2020-04-08 ENCOUNTER — Ambulatory Visit: Payer: PPO | Admitting: Speech Pathology

## 2020-04-08 ENCOUNTER — Ambulatory Visit: Payer: PPO | Admitting: Occupational Therapy

## 2020-04-08 ENCOUNTER — Ambulatory Visit: Payer: PPO | Attending: Physician Assistant | Admitting: Physical Therapy

## 2020-04-08 ENCOUNTER — Other Ambulatory Visit: Payer: Self-pay

## 2020-04-08 DIAGNOSIS — R41840 Attention and concentration deficit: Secondary | ICD-10-CM | POA: Diagnosis not present

## 2020-04-08 DIAGNOSIS — R29818 Other symptoms and signs involving the nervous system: Secondary | ICD-10-CM

## 2020-04-08 DIAGNOSIS — R2681 Unsteadiness on feet: Secondary | ICD-10-CM | POA: Diagnosis not present

## 2020-04-08 DIAGNOSIS — R2689 Other abnormalities of gait and mobility: Secondary | ICD-10-CM | POA: Insufficient documentation

## 2020-04-08 DIAGNOSIS — R278 Other lack of coordination: Secondary | ICD-10-CM | POA: Diagnosis not present

## 2020-04-08 DIAGNOSIS — M6281 Muscle weakness (generalized): Secondary | ICD-10-CM | POA: Insufficient documentation

## 2020-04-08 DIAGNOSIS — I69354 Hemiplegia and hemiparesis following cerebral infarction affecting left non-dominant side: Secondary | ICD-10-CM

## 2020-04-08 DIAGNOSIS — R209 Unspecified disturbances of skin sensation: Secondary | ICD-10-CM | POA: Diagnosis not present

## 2020-04-08 DIAGNOSIS — R414 Neurologic neglect syndrome: Secondary | ICD-10-CM | POA: Insufficient documentation

## 2020-04-08 DIAGNOSIS — R41841 Cognitive communication deficit: Secondary | ICD-10-CM

## 2020-04-08 NOTE — Patient Instructions (Signed)
Access Code: [email or web address] URL: [email or web address] Date: 03/18/2020 Prepared by: Janann August  Exercises Romberg Stance Eyes Closed on Foam Pad - 1 x daily - 5 x weekly - 3 sets - 30 hold Standing Balance - Eyes Closed with Head Motions - 2 x daily - 5 x weekly - 2 sets - 10 reps Clamshell with Resistance - 1-2 x daily - 5 x weekly - 2 sets - 12 reps Forward Step Up - 1 x daily - 5 x weekly - 2 sets - 10 reps Lateral Step Up with Unilateral Counter Support - 1 x daily - 5 x weekly - 2 sets - 10 reps

## 2020-04-08 NOTE — Therapy (Signed)
[facility] [address] [place], [place], [address] Phone: [phone]   Fax:  [phone]  Occupational Therapy Treatment  Patient Details  Name: Ruben Chavez MRN: [id number] Date of Birth: 1951-09-09 Referring Provider (OT): Lauraine Rinne, PA-C   Encounter Date: 04/08/2020  OT End of Session - 04/08/20 1422    Visit Number  12    Number of Visits  17    Date for OT Re-Evaluation  04/22/20    Authorization Type  [employer] (follow Medicare)--1 copay for multiple visits    Authorization Time Period  cert date 2/87/86-7/67/20    Authorization - Visit Number  12    Authorization - Number of Visits  20    Progress Note Due on Visit  20    OT Start Time  1325    OT Stop Time  1410    OT Time Calculation (min)  45 min    Activity Tolerance  Patient tolerated treatment well    Behavior During Therapy  [facility] for tasks assessed/performed       Past Medical History:  Diagnosis Date  . Elevated PSA   . Hyperlipidemia    pt says not anymore  . Hypertension   . Prostate cancer ([place]) 2013    Past Surgical History:  Procedure Laterality Date  . bilateral inguinal hernia  2008  . BUBBLE STUDY  01/28/2020   Procedure: BUBBLE STUDY;  Surgeon: [name], MD;  Location: [facility] ENDOSCOPY;  Service: Cardiovascular;;  . CYST REMOVAL TRUNK  2016   sebaceous cyst  . CYSTOSCOPY  04/22/2016   Procedure: CYSTOSCOPY;  Surgeon: [name], MD;  Location: [facility];  Service: Urology;;  . IR ANGIO VERTEBRAL SEL SUBCLAVIAN INNOMINATE UNI R MOD SED  01/25/2020  . IR CT HEAD LTD  01/25/2020  . IR PERCUTANEOUS ART THROMBECTOMY/INFUSION INTRACRANIAL INC DIAG ANGIO  01/25/2020  . KNEE ARTHROSCOPY  2007   right  . LOOP RECORDER INSERTION N/A 01/28/2020   Procedure: LOOP RECORDER INSERTION;  Surgeon: [name], MD;  Location: [place] CV LAB;  Service: Cardiovascular;  Laterality: N/A;  . PROSTATE BIOPSY   2008  . PROSTATE BIOPSY  2013  . PROSTATE BIOPSY  01/28/2016  . RADIOACTIVE SEED IMPLANT N/A 04/22/2016   Procedure: RADIOACTIVE SEED IMPLANT/BRACHYTHERAPY IMPLANT;  Surgeon: [name], MD;  Location: [facility];  Service: Urology;  Laterality: N/A;  . RADIOLOGY WITH ANESTHESIA N/A 01/24/2020   Procedure: IR WITH ANESTHESIA;  Surgeon: [name], MD;  Location: [place];  Service: Radiology;  Laterality: N/A;  . TEE WITHOUT CARDIOVERSION N/A 01/28/2020   Procedure: TRANSESOPHAGEAL ECHOCARDIOGRAM (TEE);  Surgeon: [name], MD;  Location: [facility] ENDOSCOPY;  Service: Cardiovascular;  Laterality: N/A;  . TONSILLECTOMY    . TOTAL HIP ARTHROPLASTY  2001   left  . TOTAL HIP ARTHROPLASTY Right 01/18/2018   Procedure: RIGHT TOTAL HIP ARTHROPLASTY ANTERIOR APPROACH;  Surgeon: [name], MD;  Location: [facility];  Service: Orthopedics;  Laterality: Right;    There were no vitals filed for this visit.  Subjective Assessment - 04/08/20 1421    Subjective   Pt reports increased wrist extension - observable during therapy    Pertinent History  PMH:  hyperlipidemia, hypertension, prostate cancer radioactive seed implant 2017, R total hip arthroplasty, loop recorder insertion    Limitations  loop recorder    Patient Stated Goals  get my L hand and fingers working    Currently  in Pain?  No/denies       P/ROM to Lt hand in full flexion followed by active finger flexion and extension as able. Wrist extension x 10 reps past neutral with increased consistency and less fatigue. Pt then performing reaching to grasp cones and place on high surface then removing w/ min assist provided distally to wrist/forearm and cues for sufficient finger flexion to hold cone.  NMES x 15 min to wrist/finger extensors (previous parameters)                       OT Short Term Goals - 03/31/20 1144      OT SHORT TERM GOAL #1   Title  Pt will be independent with initial  HEP--check STG 03/24/20    Time  4    Period  Weeks    Status  On-going   03/26/20 continues to need cueing for positioning due to cognitive deficits     OT SHORT TERM GOAL #2   Title  Pt will verbalize understanding of proper positioning of LUE to prevent pain/injury and edema management techniques.    Time  4    Period  Weeks    Status  On-going   03/26/20:  continues to need cueing for proper positioning/techniques     OT SHORT TERM GOAL #3   Title  Pt will be independent with splint wear/care for improved positioning.    Time  4    Period  Weeks    Status  Achieved      OT SHORT TERM GOAL #4   Title  Pt will be able to use LUE as a stabilizer for simple ADL tasks.    Time  4    Period  Weeks    Status  On-going      OT SHORT TERM GOAL #5   Title  Pt will demo at least 25% gross finger flex in prep for functional grasp.    Baseline  4    Time  4    Period  Weeks    Status  Achieved   except index and thumb       OT Long Term Goals - 03/14/20 1209      OT LONG TERM GOAL #1   Title  Pt will be independent with updated HEP.--check LTGs 04/23/20    Time  8    Period  Weeks    Status  New      OT LONG TERM GOAL #2   Title  Pt will be able to use LUE as nondominant assist for ADLs at least 50% of the time.    Time  8    Period  Weeks    Status  New      OT LONG TERM GOAL #3   Title  Pt will be able to score at least 10 on box and blocks test with LUE for improved coordination for ADLs.    Time  8    Period  Weeks    Status  New      OT LONG TERM GOAL #4   Title  Pt will complete clothing fasteners mod I using AE prn.    Time  8    Period  Weeks    Status  New      OT LONG TERM GOAL #5   Title  Pt will demo improved attention/visual scanning to be able to perform environmental scanning in busy enivornment with at least 95% accuracy  for incr safety for community activities/driving    Baseline  03/14/20;  58% initially    Time  8    Period  Weeks    Status  New       OT LONG TERM GOAL #6   Title  Pt will demo at least 75% gross finger flex/ext for grasp/release of objects.    Time  8    Period  Weeks    Status  New            Plan - 04/08/20 1424    Clinical Impression Statement  Pt continues to make progress with Lt wrist and hand getting more consistent wrist extension, however stronger on radial side w/ slight pronation.    Occupational performance deficits (Please refer to evaluation for details):  ADL's;IADL's;Leisure;Social Participation;Work    Marketing executive / Function / Physical Skills  ADL;Dexterity;ROM;IADL;Sensation;Mobility;Strength;FMC;Coordination;Decreased knowledge of precautions;Decreased knowledge of use of DME;Pain;UE functional use;GMC;Proprioception    Cognitive Skills  Attention;Memory;Perception;Safety Awareness    Rehab Potential  Fair    OT Frequency  2x / week    OT Duration  8 weeks    OT Treatment/Interventions  Self-care/ADL training;Moist Heat;Fluidtherapy;DME and/or AE instruction;Splinting;Therapeutic activities;Contrast Bath;Aquatic Therapy;Ultrasound;Therapeutic exercise;Cognitive remediation/compensation;Visual/perceptual remediation/compensation;Passive range of motion;Functional Mobility Training;Neuromuscular education;Cryotherapy;Electrical Stimulation;Paraffin;Energy conservation;Manual Therapy;Patient/family education    Plan  continue Neuro re-educ, NMES to wrist and finger extensors (no longer needed for finger flexion as pt is getting actively at this time!)    Consulted and Agree with Plan of Care  Patient       Patient will benefit from skilled therapeutic intervention in order to improve the following deficits and impairments:   Body Structure / Function / Physical Skills: ADL, Dexterity, ROM, IADL, Sensation, Mobility, Strength, FMC, Coordination, Decreased knowledge of precautions, Decreased knowledge of use of DME, Pain, UE functional use, GMC, Proprioception Cognitive Skills: Attention,  Memory, Perception, Safety Awareness     Visit Diagnosis: Hemiplegia and hemiparesis following cerebral infarction affecting left non-dominant side (HCC)  Other lack of coordination  Other symptoms and signs involving the nervous system    Problem List Patient Active Problem List   Diagnosis Date Noted  . Left hemiparesis ([place]) 03/28/2020  . Chronic bilateral low back pain without sciatica 02/25/2020  . Spasticity as late effect of cerebrovascular accident (CVA) 02/25/2020  . Cognitive and neurobehavioral dysfunction   . Hypertensive urgency 01/28/2020  . Urinary retention 01/28/2020  . Right middle cerebral artery stroke ([place]) 01/28/2020  . Stroke (cerebrum) ([place]) -  [name] 01/25/2020  . Acute respiratory failure with hypoxia ([place])   . Hypotension   . Hypokalemia   . Hypocalcemia   . Encephalopathy acute   . Stroke ([place]) 01/24/2020  . Middle cerebral artery embolism, right 01/24/2020  . Hyperlipidemia 02/21/2019  . Impingement syndrome of right shoulder region 02/21/2018  . OA (osteoarthritis) of hip 01/18/2018  . Prostate cancer ([place]) 03/04/2016  . Essential hypertension 11/27/2014  . History of colonic polyps 09/26/2014  . BACK PAIN, LEFT 12/22/2009  . PROSTATE SPECIFIC ANTIGEN, ELEVATED 12/20/2008  . LATERAL EPICONDYLITIS 07/15/2008  . ACTINIC KERATOSIS, FOREHEAD, LEFT 05/23/2008  . WART, LEFT HAND 07/19/2007  . ERECTILE DYSFUNCTION, MILD 07/19/2007  . HERNIA, BILATERAL INGUINAL W/O OBST/GANGRENE 07/19/2007  . MENISCUS TEAR 07/19/2007  . Bilateral inguinal hernia 07/19/2007    Carey Bullocks, OTR/L 04/08/2020, 2:27 PM  [facility] [place], [place], [address] Phone: [phone]   Fax:  [phone]  Name: Ruben Chavez MRN: [id number] Date of Birth: [birth year]

## 2020-04-08 NOTE — Therapy (Signed)
[facility] [address] [place], [place], [address] Phone: [phone]   Fax:  [phone]  Physical Therapy Treatment  Patient Details  Name: Ruben Chavez MRN: [id number] Date of Birth: 1950/12/10 Referring Provider (PT): Lauraine Rinne, PA-C (pt will be following up with Dr. Dagoberto Ligas)   Encounter Date: 04/08/2020  PT End of Session - 04/08/20 2113    Visit Number  8    Number of Visits  17    Date for PT Re-Evaluation  05/15/20   written for 60 day POC   Authorization Type  [employer]    PT Start Time  1448    PT Stop Time  1530    PT Time Calculation (min)  42 min    Activity Tolerance  Patient tolerated treatment well    Behavior During Therapy  [facility] for tasks assessed/performed       Past Medical History:  Diagnosis Date  . Elevated PSA   . Hyperlipidemia    pt says not anymore  . Hypertension   . Prostate cancer ([place]) 2013    Past Surgical History:  Procedure Laterality Date  . bilateral inguinal hernia  2008  . BUBBLE STUDY  01/28/2020   Procedure: BUBBLE STUDY;  Surgeon: [name], MD;  Location: [facility] ENDOSCOPY;  Service: Cardiovascular;;  . CYST REMOVAL TRUNK  2016   sebaceous cyst  . CYSTOSCOPY  04/22/2016   Procedure: CYSTOSCOPY;  Surgeon: [name], MD;  Location: [facility];  Service: Urology;;  . IR ANGIO VERTEBRAL SEL SUBCLAVIAN INNOMINATE UNI R MOD SED  01/25/2020  . IR CT HEAD LTD  01/25/2020  . IR PERCUTANEOUS ART THROMBECTOMY/INFUSION INTRACRANIAL INC DIAG ANGIO  01/25/2020  . KNEE ARTHROSCOPY  2007   right  . LOOP RECORDER INSERTION N/A 01/28/2020   Procedure: LOOP RECORDER INSERTION;  Surgeon: [name], MD;  Location: [place] CV LAB;  Service: Cardiovascular;  Laterality: N/A;  . PROSTATE BIOPSY  2008  . PROSTATE BIOPSY  2013  . PROSTATE BIOPSY  01/28/2016  . RADIOACTIVE SEED IMPLANT N/A 04/22/2016   Procedure: RADIOACTIVE SEED  IMPLANT/BRACHYTHERAPY IMPLANT;  Surgeon: [name], MD;  Location: [facility];  Service: Urology;  Laterality: N/A;  . RADIOLOGY WITH ANESTHESIA N/A 01/24/2020   Procedure: IR WITH ANESTHESIA;  Surgeon: [name], MD;  Location: [place];  Service: Radiology;  Laterality: N/A;  . TEE WITHOUT CARDIOVERSION N/A 01/28/2020   Procedure: TRANSESOPHAGEAL ECHOCARDIOGRAM (TEE);  Surgeon: [name], MD;  Location: [facility] ENDOSCOPY;  Service: Cardiovascular;  Laterality: N/A;  . TONSILLECTOMY    . TOTAL HIP ARTHROPLASTY  2001   left  . TOTAL HIP ARTHROPLASTY Right 01/18/2018   Procedure: RIGHT TOTAL HIP ARTHROPLASTY ANTERIOR APPROACH;  Surgeon: [name], MD;  Location: [facility];  Service: Orthopedics;  Laterality: Right;    There were no vitals filed for this visit.  Subjective Assessment - 04/08/20 1450    Subjective  No changes, no falls. His hand is getting better.    Patient is accompained by:  Family member   wife katherine   Pertinent History  hyperlipidemia, hypertension, prostate cancer with radioactive seed implant, remote tobacco abuse.    Diagnostic tests  Cranial CT scan showed subacute infarction right lateral temporal lobe    Patient Stated Goals  wants to get left hand and arm working, wants to improve his balance and changing direction (per wife).    Currently in Pain?  No/denies  [facility] PT Assessment - 04/08/20 1450      Functional Gait  Assessment   Gait assessed   Yes    Gait Level Surface  Walks 20 ft in less than 7 sec but greater than 5.5 sec, uses assistive device, slower speed, mild gait deviations, or deviates 6-10 in outside of the 12 in walkway width.    Change in Gait Speed  Able to smoothly change walking speed without loss of balance or gait deviation. Deviate no more than 6 in outside of the 12 in walkway width.    Gait with Horizontal Head Turns  Performs head turns smoothly with no change in gait. Deviates no more than 6 in  outside 12 in walkway width    Gait with Vertical Head Turns  Performs head turns with no change in gait. Deviates no more than 6 in outside 12 in walkway width.    Gait and Pivot Turn  Pivot turns safely within 3 sec and stops quickly with no loss of balance.    Step Over Obstacle  Is able to step over 2 stacked shoe boxes taped together (9 in total height) without changing gait speed. No evidence of imbalance.    Gait with Narrow Base of Support  Is able to ambulate for 10 steps heel to toe with no staggering.    Gait with Eyes Closed  Walks 20 ft, uses assistive device, slower speed, mild gait deviations, deviates 6-10 in outside 12 in walkway width. Ambulates 20 ft in less than 9 sec but greater than 7 sec.    Ambulating Backwards  Walks 20 ft, no assistive devices, good speed, no evidence for imbalance, normal gait    Steps  Alternating feet, no rail.    Total Score  28    FGA comment:  28/30                   [facility] Adult PT Treatment/Exercise - 04/08/20 0001      Therapeutic Activites    Other Therapeutic Activities  while performing FGA, therapist noted that pt had a bleeding cut on his R hand (from unknown cause), therapist cleaned with anti-septic solution and applied band aid          Access Code: [email or web address] URL: [email or web address] Date: 03/18/2020 Prepared by: Janann August  Exercises Romberg Stance Eyes Closed on Foam Pad - 1 x daily - 5 x weekly - 3 sets - 30 hold - performed on 2 pillows  Standing Balance - Eyes Closed with Head Motions - 2 x daily - 5 x weekly - 2 sets - 10 reps - with feet hip width distance  Clamshell with Resistance - 1-2 x daily - 5 x weekly - 2 sets - 12 reps - on L, with use of green theraband, pt able to demonstrate how to get loop on and off legs with just using R hand  Forward Step Up - 1 x daily - 5 x weekly - 2 sets - 10 reps - on LLE, with floating non-stance leg, needing handrail for support  Lateral Step Up with  Unilateral Counter Support - 1 x daily - 5 x weekly - 2 sets - 10 reps - on LLE, with floating non-stance leg, needing handrail for support      PT Education - 04/08/20 2113    Education Details  reviewed pt's HEP (as pt reports he has not been performing consistently at home), new additions to HEP, results of FGA  Person(s) Educated  Patient    Methods  Explanation;Demonstration;Handout    Comprehension  Verbalized understanding;Returned demonstration       PT Short Term Goals - 04/08/20 1502      PT SHORT TERM GOAL #1   Title  Pt will be independent with initial HEP in order to build upon functional gains made in therapy. ALL STGS DUE 04/15/20    Time  4   due to delay in scheduling - 1st return visit 03/18/20   Period  Weeks    Status  New    Target Date  04/15/20      PT SHORT TERM GOAL #2   Title  Pt will improve FGA score to at least a 27/30 in order to indicate decr fall risk.    Baseline  28/30 on 04/08/20    Time  4    Period  Weeks    Status  Revised      PT SHORT TERM GOAL #3   Title  Pt will ambulate at least 300' over level surfaces while scanning environment with supervision and no LOB.    Time  4    Period  Weeks    Status  New      PT SHORT TERM GOAL #4   Title  Pt will decr 5x sit <> stand time to 14 seconds or less in order to indicate improved functional LE strength.    Baseline  16.69 seconds on 02/15/20    Time  4    Period  Weeks    Status  New      PT SHORT TERM GOAL #5   Title  Pt will improve gait speed to at least 2.9 ft/sec in order to demo improved gait efficiency.    Time  4    Period  Weeks    Status  New        PT Long Term Goals - 03/18/20 2110      PT LONG TERM GOAL #1   Title  Pt will be independent with final HEP in order to build upon functional gains made in therapy. ALL LTGS DUE 05/13/20    Time  8   due to delay in scheduling   Period  Weeks    Status  New    Target Date  05/13/20      PT LONG TERM GOAL #2   Title  Pt  will improve FGA score to at least a 29/30 in order to indicate decr fall risk.    Time  4    Period  Weeks    Status  New      PT LONG TERM GOAL #3   Title  30 second chair stand goal to be written as appropriate to demo improved LE strength and endurance.    Time  8    Period  Weeks    Status  New      PT LONG TERM GOAL #4   Title  Pt will improve gait speed to at least 3.4 ft/sec in order to demo improved gait efficiency in the community.    Time  8    Period  Weeks    Status  New      PT LONG TERM GOAL #5   Title  Pt will ambulate at least 500' over unlevel surfaces with mod I and scanning environment in order to improve community mobility.    Time  8    Period  Weeks  Status  New      PT LONG TERM GOAL #6   Title  Pt will perform 12 steps with no handrail and step through pattern with supervision in order to indicate improved functional LE strength.    Time  8    Period  Weeks    Status  New            Plan - 04/08/20 2116    Clinical Impression Statement  Performed the FGA today to re-assess pt's balance, pt scoring a 28/30 today, indicating a low risk of falls with pt having the most difficulty with gait with eyes closed. Reviewed pt's HEP for standing balance on a compliant surface with eyes closed as pt reports he has not been consistently performing at home. Also added LLE strengthening for step ups and hip ABD with pt able to demo all correctly at today's session. Will continue to progress towards LTGs.    Personal Factors and Comorbidities  Comorbidity 3+;Past/Current Experience    Comorbidities  hyperlipidemia, hypertension, prostate cancer with radioactive seed implant, remote tobacco abuse.    Examination-Activity Limitations  Stairs;Locomotion Level    Examination-Participation Restrictions  Community Activity;Driving;Yard Work   being on his boat   Stability/Clinical Decision Making  Stable/Uncomplicated    Rehab Potential  Good    PT Frequency  2x /  week    PT Duration  8 weeks    PT Treatment/Interventions  ADLs/Self Care Home Management;Therapeutic activities;Functional mobility training;Stair training;Gait training;Therapeutic exercise;Balance training;Neuromuscular re-education;Patient/family education    PT Next Visit Plan  how is HEP?* hip ABD strengthening on L. continue with high level L SLS activities. high level balance (eyes closed) [place] (states that there are a lot of gravel surfaces and obstacles he would need to step over)    PT Home Exercise Plan  [email or web address]    Consulted and Agree with Plan of Care  Patient       Patient will benefit from skilled therapeutic intervention in order to improve the following deficits and impairments:  Abnormal gait, Decreased activity tolerance, Decreased balance, Decreased safety awareness, Difficulty walking, Decreased strength, Impaired UE functional use  Visit Diagnosis: Other lack of coordination  Unsteadiness on feet  Hemiplegia and hemiparesis following cerebral infarction affecting left non-dominant side [facility])     Problem List Patient Active Problem List   Diagnosis Date Noted  . Left hemiparesis ([place]) 03/28/2020  . Chronic bilateral low back pain without sciatica 02/25/2020  . Spasticity as late effect of cerebrovascular accident (CVA) 02/25/2020  . Cognitive and neurobehavioral dysfunction   . Hypertensive urgency 01/28/2020  . Urinary retention 01/28/2020  . Right middle cerebral artery stroke ([place]) 01/28/2020  . Stroke (cerebrum) ([place]) -  [name] 01/25/2020  . Acute respiratory failure with hypoxia ([place])   . Hypotension   . Hypokalemia   . Hypocalcemia   . Encephalopathy acute   . Stroke ([place]) 01/24/2020  . Middle cerebral artery embolism, right 01/24/2020  . Hyperlipidemia 02/21/2019  . Impingement  syndrome of right shoulder region 02/21/2018  . OA (osteoarthritis) of hip 01/18/2018  . Prostate cancer ([place]) 03/04/2016  . Essential hypertension 11/27/2014  . History of colonic polyps 09/26/2014  . BACK PAIN, LEFT 12/22/2009  . PROSTATE SPECIFIC ANTIGEN, ELEVATED 12/20/2008  . LATERAL EPICONDYLITIS 07/15/2008  .  ACTINIC KERATOSIS, FOREHEAD, LEFT 05/23/2008  . WART, LEFT HAND 07/19/2007  . ERECTILE DYSFUNCTION, MILD 07/19/2007  . HERNIA, BILATERAL INGUINAL W/O OBST/GANGRENE 07/19/2007  . MENISCUS TEAR 07/19/2007  . Bilateral inguinal hernia 07/19/2007    Arliss Journey, PT ,DPT  04/08/2020, 9:22 PM  [facility] [address] [place], [place], [address] Phone: [phone]   Fax:  [phone]  Name: Ruben Chavez MRN: [id number] Date of Birth: Jul 24, 1951

## 2020-04-08 NOTE — Therapy (Signed)
[facility] [address] [place], [place], [address] Phone: [phone]   Fax:  [phone]  Speech Language Pathology Treatment  Patient Details  Name: Ruben Chavez MRN: [id number] Date of Birth: 09/05/1951 Referring Provider (SLP): Lauraine Rinne, PA-C   Encounter Date: 04/08/2020  End of Session - 04/08/20 1650    Visit Number  9    Number of Visits  17    Date for SLP Re-Evaluation  04/19/20    Authorization Type  one copay for multiple disciplines if same day    SLP Start Time  1405    SLP Stop Time   1445    SLP Time Calculation (min)  40 min    Activity Tolerance  Patient tolerated treatment well       Past Medical History:  Diagnosis Date  . Elevated PSA   . Hyperlipidemia    pt says not anymore  . Hypertension   . Prostate cancer ([place]) 2013    Past Surgical History:  Procedure Laterality Date  . bilateral inguinal hernia  2008  . BUBBLE STUDY  01/28/2020   Procedure: BUBBLE STUDY;  Surgeon: [name], MD;  Location: [facility] ENDOSCOPY;  Service: Cardiovascular;;  . CYST REMOVAL TRUNK  2016   sebaceous cyst  . CYSTOSCOPY  04/22/2016   Procedure: CYSTOSCOPY;  Surgeon: [name], MD;  Location: [facility];  Service: Urology;;  . IR ANGIO VERTEBRAL SEL SUBCLAVIAN INNOMINATE UNI R MOD SED  01/25/2020  . IR CT HEAD LTD  01/25/2020  . IR PERCUTANEOUS ART THROMBECTOMY/INFUSION INTRACRANIAL INC DIAG ANGIO  01/25/2020  . KNEE ARTHROSCOPY  2007   right  . LOOP RECORDER INSERTION N/A 01/28/2020   Procedure: LOOP RECORDER INSERTION;  Surgeon: [name], MD;  Location: [place] CV LAB;  Service: Cardiovascular;  Laterality: N/A;  . PROSTATE BIOPSY  2008  . PROSTATE BIOPSY  2013  . PROSTATE BIOPSY  01/28/2016  . RADIOACTIVE SEED IMPLANT N/A 04/22/2016   Procedure: RADIOACTIVE SEED IMPLANT/BRACHYTHERAPY IMPLANT;  Surgeon: [name], MD;  Location: [facility];  Service: Urology;  Laterality: N/A;  . RADIOLOGY WITH ANESTHESIA N/A 01/24/2020   Procedure: IR WITH ANESTHESIA;  Surgeon: [name], MD;  Location: [place];  Service: Radiology;  Laterality: N/A;  . TEE WITHOUT CARDIOVERSION N/A 01/28/2020   Procedure: TRANSESOPHAGEAL ECHOCARDIOGRAM (TEE);  Surgeon: [name], MD;  Location: [facility] ENDOSCOPY;  Service: Cardiovascular;  Laterality: N/A;  . TONSILLECTOMY    . TOTAL HIP ARTHROPLASTY  2001   left  . TOTAL HIP ARTHROPLASTY Right 01/18/2018   Procedure: RIGHT TOTAL HIP ARTHROPLASTY ANTERIOR APPROACH;  Surgeon: [name], MD;  Location: [facility];  Service: Orthopedics;  Laterality: Right;    There were no vitals filed for this visit.  Subjective Assessment - 04/08/20 1409    Subjective  "I always think I do fine."    Currently in Pain?  No/denies            ADULT SLP TREATMENT - 04/08/20 1649      General Information   Behavior/Cognition  Alert;Cooperative;Pleasant mood;Impulsive;Requires cueing      Treatment Provided   Treatment provided  Cognitive-Linquistic      Pain Assessment   Pain Assessment  No/denies pain      Cognitive-Linquistic Treatment   Treatment focused on  Cognition    Skilled Treatment  Pt completed homework (simple problem solving) 95% accuracy; checked answers with occasional min cues  and noted overlooked problem with min A. Patient told SLP about appointment misunderstanding yesterday. He was adamant that cardiologist office had made an error, insisting he saw Truitt Merle, NP during his last "remote" check. SLP reviewed pt's appointments in Epic and showed pt that he had seen this provider on a different day during a scheduled office the same week. Although patient acknowledges short-term memory deficits, he did not acknowledge that it was plausible that he had made errors with his appointments or recalling information incorrectly, even when therapist pointed out errors as he discussed other  appointments (incorrectly). Patient awareness/insight remains relatively unchanged, limiting progress. Therapist suggested decrease in frequency to 1x per week may be appropriate, and patient agreed with this.       Assessment / Recommendations / Plan   Plan  Other (Comment)   decrease frequency to 1x per week     Progression Toward Goals   Progression toward goals  Not progressing toward goals (comment)   awareness/insight limit progress        SLP Short Term Goals - 04/08/20 1652      SLP SHORT TERM GOAL #1   Title  Patient will use compensatory aids/strategies (electronic or paper) with occasional cues from spouse to be on time for appointments x3 sessions    Baseline  03-24-20, 03-26-20    Status  Partially Met      SLP SHORT TERM GOAL #2   Title  Patient will complete standardized cognitive testing within the first 3 sessions.    Status  Achieved      SLP SHORT TERM GOAL #3   Title  Patient will demonstrate appropriate topic maintenance over 5 minute conversation with occasional min cues x2 sessions.    Status  Not Met      SLP SHORT TERM GOAL #4   Title  pt will demo selective attention adequate to complete a 5-minute therapy task in 3 sessions    Baseline  03-24-20    Status  Partially Met      SLP SHORT TERM GOAL #5   Title  pt will demo functional organization in a therapy task, with occasional mod A    Status  Not Met       SLP Long Term Goals - 04/08/20 1652      SLP LONG TERM GOAL #1   Title  Patient will use compensatory aids/strategies (electronic or paper) to manage schedule, appointments, and daily tasks with rare min A x 4 sessions.    Baseline  03-31-20    Time  3    Period  Weeks    Status  On-going      SLP LONG TERM GOAL #2   Title  Pt will ID and correct errors on financial documents with occasional min A over 3 sessions    Time  3    Period  Weeks    Status  On-going      SLP LONG TERM GOAL #3   Title  Patient will demonstrate appropriate  topic maintenance over 6 minute conversation with modified independence x 3 sessions.    Time  3    Period  Weeks    Status  On-going      SLP LONG TERM GOAL #4   Title  pt will demo selective attention adequate for completing a simple to mod-complex 10-minute task, over three sessions    Time  3    Period  Weeks    Status  On-going  Plan - 04/08/20 1651    Clinical Impression Statement  Mr. Geronimo Diliberto presents with, functionally, moderate cognitive communication deficits. Pt demonstrated reduced awareness/insight into his deficits and poor frustration tolerance today in session, which are relatively unchanged over past several sessions. See "skilled intervention" for more details. Plan to decrease frequency to 1x per week and possible d/c if awareness/insight continue to limit pt's progress toward goals. I recommend cont'd skilled ST to address cognitive communication impairment to maximize safety and independence at home.    Speech Therapy Frequency  1x /week    Duration  --   8 weeks or 17 visits   Treatment/Interventions  Environmental controls;SLP instruction and feedback;Cueing hierarchy;Compensatory techniques;Cognitive reorganization;Functional tasks;Compensatory strategies;Internal/external aids;Multimodal communcation approach;Patient/family education    Potential to Achieve Goals  Good    Consulted and Agree with Plan of Care  Patient;Family member/caregiver       Patient will benefit from skilled therapeutic intervention in order to improve the following deficits and impairments:   Cognitive communication deficit    Problem List Patient Active Problem List   Diagnosis Date Noted  . Left hemiparesis ([place]) 03/28/2020  . Chronic bilateral low back pain without sciatica 02/25/2020  . Spasticity as late effect of cerebrovascular accident (CVA) 02/25/2020  . Cognitive and neurobehavioral dysfunction   . Hypertensive urgency 01/28/2020  . Urinary retention  01/28/2020  . Right middle cerebral artery stroke ([place]) 01/28/2020  . Stroke (cerebrum) ([place]) -  [name] 01/25/2020  . Acute respiratory failure with hypoxia ([place])   . Hypotension   . Hypokalemia   . Hypocalcemia   . Encephalopathy acute   . Stroke ([place]) 01/24/2020  . Middle cerebral artery embolism, right 01/24/2020  . Hyperlipidemia 02/21/2019  . Impingement syndrome of right shoulder region 02/21/2018  . OA (osteoarthritis) of hip 01/18/2018  . Prostate cancer ([place]) 03/04/2016  . Essential hypertension 11/27/2014  . History of colonic polyps 09/26/2014  . BACK PAIN, LEFT 12/22/2009  . PROSTATE SPECIFIC ANTIGEN, ELEVATED 12/20/2008  . LATERAL EPICONDYLITIS 07/15/2008  . ACTINIC KERATOSIS, FOREHEAD, LEFT 05/23/2008  . WART, LEFT HAND 07/19/2007  . ERECTILE DYSFUNCTION, MILD 07/19/2007  . HERNIA, BILATERAL INGUINAL W/O OBST/GANGRENE 07/19/2007  . MENISCUS TEAR 07/19/2007  . Bilateral inguinal hernia 07/19/2007   Deneise Lever, [place], [place] 04/08/2020, 4:53 PM  [facility] [address] [place], [place], [address] Phone: [phone]   Fax:  [phone]   Name: JAKYLAN RON MRN: [id number] Date of Birth: Feb 08, 1951

## 2020-04-10 ENCOUNTER — Ambulatory Visit: Payer: PPO | Admitting: Speech Pathology

## 2020-04-10 ENCOUNTER — Ambulatory Visit: Payer: PPO | Admitting: Occupational Therapy

## 2020-04-10 ENCOUNTER — Other Ambulatory Visit: Payer: Self-pay

## 2020-04-10 ENCOUNTER — Ambulatory Visit: Payer: PPO

## 2020-04-10 DIAGNOSIS — R2689 Other abnormalities of gait and mobility: Secondary | ICD-10-CM

## 2020-04-10 DIAGNOSIS — R278 Other lack of coordination: Secondary | ICD-10-CM

## 2020-04-10 DIAGNOSIS — M6281 Muscle weakness (generalized): Secondary | ICD-10-CM

## 2020-04-10 DIAGNOSIS — R29818 Other symptoms and signs involving the nervous system: Secondary | ICD-10-CM

## 2020-04-10 NOTE — Therapy (Signed)
[facility] [address] [place], [place], [address] Phone: [phone]   Fax:  [phone]  Physical Therapy Treatment  Patient Details  Name: Ruben Chavez MRN: [id number] Date of Birth: 1951/07/28 Referring Provider (PT): Lauraine Rinne, PA-C (pt will be following up with Dr. Dagoberto Ligas)   Encounter Date: 04/10/2020  PT End of Session - 04/10/20 1453    Visit Number  9    Number of Visits  17    Date for PT Re-Evaluation  05/15/20   written for 60 day POC   Authorization Type  [employer]    PT Start Time  1450    PT Stop Time  1530    PT Time Calculation (min)  40 min    Activity Tolerance  Patient tolerated treatment well    Behavior During Therapy  [facility] for tasks assessed/performed       Past Medical History:  Diagnosis Date  . Elevated PSA   . Hyperlipidemia    pt says not anymore  . Hypertension   . Prostate cancer ([place]) 2013    Past Surgical History:  Procedure Laterality Date  . bilateral inguinal hernia  2008  . BUBBLE STUDY  01/28/2020   Procedure: BUBBLE STUDY;  Surgeon: [name], MD;  Location: [facility] ENDOSCOPY;  Service: Cardiovascular;;  . CYST REMOVAL TRUNK  2016   sebaceous cyst  . CYSTOSCOPY  04/22/2016   Procedure: CYSTOSCOPY;  Surgeon: [name], MD;  Location: [facility];  Service: Urology;;  . IR ANGIO VERTEBRAL SEL SUBCLAVIAN INNOMINATE UNI R MOD SED  01/25/2020  . IR CT HEAD LTD  01/25/2020  . IR PERCUTANEOUS ART THROMBECTOMY/INFUSION INTRACRANIAL INC DIAG ANGIO  01/25/2020  . KNEE ARTHROSCOPY  2007   right  . LOOP RECORDER INSERTION N/A 01/28/2020   Procedure: LOOP RECORDER INSERTION;  Surgeon: [name], MD;  Location: [place] CV LAB;  Service: Cardiovascular;  Laterality: N/A;  . PROSTATE BIOPSY  2008  . PROSTATE BIOPSY  2013  . PROSTATE BIOPSY  01/28/2016  . RADIOACTIVE SEED IMPLANT N/A 04/22/2016   Procedure: RADIOACTIVE SEED  IMPLANT/BRACHYTHERAPY IMPLANT;  Surgeon: [name], MD;  Location: [facility];  Service: Urology;  Laterality: N/A;  . RADIOLOGY WITH ANESTHESIA N/A 01/24/2020   Procedure: IR WITH ANESTHESIA;  Surgeon: [name], MD;  Location: [facility];  Service: Radiology;  Laterality: N/A;  . TEE WITHOUT CARDIOVERSION N/A 01/28/2020   Procedure: TRANSESOPHAGEAL ECHOCARDIOGRAM (TEE);  Surgeon: [name], MD;  Location: [facility] ENDOSCOPY;  Service: Cardiovascular;  Laterality: N/A;  . TONSILLECTOMY    . TOTAL HIP ARTHROPLASTY  2001   left  . TOTAL HIP ARTHROPLASTY Right 01/18/2018   Procedure: RIGHT TOTAL HIP ARTHROPLASTY ANTERIOR APPROACH;  Surgeon: [name], MD;  Location: [facility];  Service: Orthopedics;  Laterality: Right;    There were no vitals filed for this visit.  Subjective Assessment - 04/10/20 1453    Subjective  Pt reports that he has been doing ok with balance and walking.    Patient is accompained by:  --    Pertinent History  hyperlipidemia, hypertension, prostate cancer with radioactive seed implant, remote tobacco abuse.    Diagnostic tests  Cranial CT scan showed subacute infarction right lateral temporal lobe    Patient Stated Goals  wants to get left hand and arm working, wants to improve his balance and changing direction (per wife).    Currently in Pain?  No/denies  [facility] Adult PT Treatment/Exercise - 04/10/20 1454      Ambulation/Gait   Ambulation/Gait  Yes    Ambulation/Gait Assistance  5: Supervision    Ambulation/Gait Assistance Details  Pt ambulated outside including over mulch and up/down curb from mulch and up/down hill in grass. Pt ambulated with speed changes on command x 100' and with head turns x 100'. Verbal cues to increase foot clearance over grass. Pt was also cued to increase step length.    Ambulation Distance (Feet)  1000 Feet    Assistive device  None    Gait Pattern  Step-through  pattern;Decreased step length - right;Decreased step length - left    Ambulation Surface  Level;Unlevel;Outdoor;Paved;Grass      Neuro Re-ed    Neuro Re-ed Details   Step-ups on 6" step forward and lateral with raising opposite knee up in march for increased SLS time x 10 each. Standing on airex  with RLE on soccerball for SLS on LLE 30 sec x 2 CGA then with moving soccerball ant/post x 10 and side to side x 10 with RLE, Standing on rockerboard alternating toe taps on soccerball x 10, dribbling soccerball around gym trying to keep close to feet and on path to work on balance and BLE coordination, SLS on left moving soccerball around left leg with right leg having to touch right foot down a couple times CGA.             PT Education - 04/10/20 2023    Education Details  Pt to continue with current HEP    Person(s) Educated  Patient    Methods  Explanation    Comprehension  Verbalized understanding       PT Short Term Goals - 04/08/20 1502      PT SHORT TERM GOAL #1   Title  Pt will be independent with initial HEP in order to build upon functional gains made in therapy. ALL STGS DUE 04/15/20    Time  4   due to delay in scheduling - 1st return visit 03/18/20   Period  Weeks    Status  New    Target Date  04/15/20      PT SHORT TERM GOAL #2   Title  Pt will improve FGA score to at least a 27/30 in order to indicate decr fall risk.    Baseline  28/30 on 04/08/20    Time  4    Period  Weeks    Status  Revised      PT SHORT TERM GOAL #3   Title  Pt will ambulate at least 300' over level surfaces while scanning environment with supervision and no LOB.    Time  4    Period  Weeks    Status  New      PT SHORT TERM GOAL #4   Title  Pt will decr 5x sit <> stand time to 14 seconds or less in order to indicate improved functional LE strength.    Baseline  16.69 seconds on 02/15/20    Time  4    Period  Weeks    Status  New      PT SHORT TERM GOAL #5   Title  Pt will improve gait  speed to at least 2.9 ft/sec in order to demo improved gait efficiency.    Time  4    Period  Weeks    Status  New        PT Long Term  Goals - 03/18/20 2110      PT LONG TERM GOAL #1   Title  Pt will be independent with final HEP in order to build upon functional gains made in therapy. ALL LTGS DUE 05/13/20    Time  8   due to delay in scheduling   Period  Weeks    Status  New    Target Date  05/13/20      PT LONG TERM GOAL #2   Title  Pt will improve FGA score to at least a 29/30 in order to indicate decr fall risk.    Time  4    Period  Weeks    Status  New      PT LONG TERM GOAL #3   Title  30 second chair stand goal to be written as appropriate to demo improved LE strength and endurance.    Time  8    Period  Weeks    Status  New      PT LONG TERM GOAL #4   Title  Pt will improve gait speed to at least 3.4 ft/sec in order to demo improved gait efficiency in the community.    Time  8    Period  Weeks    Status  New      PT LONG TERM GOAL #5   Title  Pt will ambulate at least 500' over unlevel surfaces with mod I and scanning environment in order to improve community mobility.    Time  8    Period  Weeks    Status  New      PT LONG TERM GOAL #6   Title  Pt will perform 12 steps with no handrail and step through pattern with supervision in order to indicate improved functional LE strength.    Time  8    Period  Weeks    Status  New            Plan - 04/10/20 2024    Clinical Impression Statement  Pt did well with gait outside on varied surfaces. Continues to focus on balance activities on compliant surfaces including more SLS on LLE. Pt continues to show improved control.    Personal Factors and Comorbidities  Comorbidity 3+;Past/Current Experience    Comorbidities  hyperlipidemia, hypertension, prostate cancer with radioactive seed implant, remote tobacco abuse.    Examination-Activity Limitations  Stairs;Locomotion Level    Examination-Participation  Restrictions  Community Activity;Driving;Yard Work   being on his boat   Stability/Clinical Decision Making  Stable/Uncomplicated    Rehab Potential  Good    PT Frequency  2x / week    PT Duration  8 weeks    PT Treatment/Interventions  ADLs/Self Care Home Management;Therapeutic activities;Functional mobility training;Stair training;Gait training;Therapeutic exercise;Balance training;Neuromuscular re-education;Patient/family education    PT Next Visit Plan  Check STGs next visit. how is HEP?* hip ABD strengthening on L. continue with high level L SLS activities. high level balance (eyes closed) [place] (states that there are a lot of gravel surfaces and obstacles he would need to step over)    PT Home Exercise Plan  [id number]    Consulted and Agree with Plan of Care  Patient       Patient will benefit from skilled therapeutic intervention in order to improve the following deficits and impairments:  Abnormal gait, Decreased activity tolerance, Decreased balance, Decreased safety awareness, Difficulty walking, Decreased  strength, Impaired UE functional use  Visit Diagnosis: Other abnormalities of gait and mobility  Muscle weakness (generalized)     Problem List Patient Active Problem List   Diagnosis Date Noted  . Left hemiparesis ([place]) 03/28/2020  . Chronic bilateral low back pain without sciatica 02/25/2020  . Spasticity as late effect of cerebrovascular accident (CVA) 02/25/2020  . Cognitive and neurobehavioral dysfunction   . Hypertensive urgency 01/28/2020  . Urinary retention 01/28/2020  . Right middle cerebral artery stroke ([place]) 01/28/2020  . Stroke (cerebrum) ([place]) -  [name] 01/25/2020  . Acute respiratory failure with hypoxia ([place])   . Hypotension   . Hypokalemia   . Hypocalcemia   . Encephalopathy acute   .  Stroke ([place]) 01/24/2020  . Middle cerebral artery embolism, right 01/24/2020  . Hyperlipidemia 02/21/2019  . Impingement syndrome of right shoulder region 02/21/2018  . OA (osteoarthritis) of hip 01/18/2018  . Prostate cancer ([place]) 03/04/2016  . Essential hypertension 11/27/2014  . History of colonic polyps 09/26/2014  . BACK PAIN, LEFT 12/22/2009  . PROSTATE SPECIFIC ANTIGEN, ELEVATED 12/20/2008  . LATERAL EPICONDYLITIS 07/15/2008  . ACTINIC KERATOSIS, FOREHEAD, LEFT 05/23/2008  . WART, LEFT HAND 07/19/2007  . ERECTILE DYSFUNCTION, MILD 07/19/2007  . HERNIA, BILATERAL INGUINAL W/O OBST/GANGRENE 07/19/2007  . MENISCUS TEAR 07/19/2007  . Bilateral inguinal hernia 07/19/2007    Electa Sniff, PT, DPT, NCS 04/10/2020, 8:27 PM  [facility] [address] [place], [place], [address] Phone: [phone]   Fax:  [phone]  Name: Ruben Chavez MRN: [id number] Date of Birth: February 15, 1951

## 2020-04-10 NOTE — Therapy (Signed)
[facility] [address] [place], [place], [address] Phone: [phone]   Fax:  [phone]  Occupational Therapy Treatment  Patient Details  Name: Ruben Chavez MRN: [id number] Date of Birth: 1951/05/12 Referring Provider (OT): Lauraine Rinne, PA-C   Encounter Date: 04/10/2020  OT End of Session - 04/10/20 1605    Visit Number  13    Number of Visits  17    Date for OT Re-Evaluation  04/22/20    Authorization Type  [employer] (follow Medicare)--1 copay for multiple visits    Authorization Time Period  cert date 6/60/63-0/16/01    Authorization - Visit Number  13    Authorization - Number of Visits  20    Progress Note Due on Visit  20    OT Start Time  1530    OT Stop Time  1615    OT Time Calculation (min)  45 min    Activity Tolerance  Patient tolerated treatment well    Behavior During Therapy  [facility] for tasks assessed/performed       Past Medical History:  Diagnosis Date  . Elevated PSA   . Hyperlipidemia    pt says not anymore  . Hypertension   . Prostate cancer ([place]) 2013    Past Surgical History:  Procedure Laterality Date  . bilateral inguinal hernia  2008  . BUBBLE STUDY  01/28/2020   Procedure: BUBBLE STUDY;  Surgeon: [name], MD;  Location: [facility] ENDOSCOPY;  Service: Cardiovascular;;  . CYST REMOVAL TRUNK  2016   sebaceous cyst  . CYSTOSCOPY  04/22/2016   Procedure: CYSTOSCOPY;  Surgeon: [name], MD;  Location: [facility];  Service: Urology;;  . IR ANGIO VERTEBRAL SEL SUBCLAVIAN INNOMINATE UNI R MOD SED  01/25/2020  . IR CT HEAD LTD  01/25/2020  . IR PERCUTANEOUS ART THROMBECTOMY/INFUSION INTRACRANIAL INC DIAG ANGIO  01/25/2020  . KNEE ARTHROSCOPY  2007   right  . LOOP RECORDER INSERTION N/A 01/28/2020   Procedure: LOOP RECORDER INSERTION;  Surgeon: [name], MD;  Location: [facility] CV LAB;  Service: Cardiovascular;  Laterality: N/A;  . PROSTATE BIOPSY   2008  . PROSTATE BIOPSY  2013  . PROSTATE BIOPSY  01/28/2016  . RADIOACTIVE SEED IMPLANT N/A 04/22/2016   Procedure: RADIOACTIVE SEED IMPLANT/BRACHYTHERAPY IMPLANT;  Surgeon: [name], MD;  Location: [facility];  Service: Urology;  Laterality: N/A;  . RADIOLOGY WITH ANESTHESIA N/A 01/24/2020   Procedure: IR WITH ANESTHESIA;  Surgeon: [name], MD;  Location: [facility];  Service: Radiology;  Laterality: N/A;  . TEE WITHOUT CARDIOVERSION N/A 01/28/2020   Procedure: TRANSESOPHAGEAL ECHOCARDIOGRAM (TEE);  Surgeon: [name], MD;  Location: [facility] ENDOSCOPY;  Service: Cardiovascular;  Laterality: N/A;  . TONSILLECTOMY    . TOTAL HIP ARTHROPLASTY  2001   left  . TOTAL HIP ARTHROPLASTY Right 01/18/2018   Procedure: RIGHT TOTAL HIP ARTHROPLASTY ANTERIOR APPROACH;  Surgeon: [name], MD;  Location: [facility];  Service: Orthopedics;  Laterality: Right;    There were no vitals filed for this visit.  Subjective Assessment - 04/10/20 1533    Pertinent History  PMH:  hyperlipidemia, hypertension, prostate cancer radioactive seed implant 2017, R total hip arthroplasty, loop recorder insertion    Limitations  loop recorder    Patient Stated Goals  get my L hand and fingers working    Currently in Pain?  No/denies      Passive flexion and extension of Lt  hand followed by AA/ROM and A/ROM in finger flex/ext, thumb palmer abd as able with assist, A/ROM in wrist ext against gravity (stronger on radial side). Worked on isolated wrist ext, radial deviation to hold cup with arm extended w/ compensations into supination and wrist flexion. Pt also with limited to no movement index and thumb, but more so digits 3-5. High level sh flexion w/ control to maintain neutral forearm and wrist to neutral.  NMES x 10 min to wrist and finger extensors w/ good response (previous parameters)                        OT Short Term Goals - 04/10/20 1609      OT SHORT TERM GOAL  #1   Title  Pt will be independent with initial HEP--check STG 03/24/20    Time  4    Period  Weeks    Status  Achieved      OT SHORT TERM GOAL #2   Title  Pt will verbalize understanding of proper positioning of LUE to prevent pain/injury and edema management techniques.    Time  4    Period  Weeks    Status  Achieved      OT SHORT TERM GOAL #3   Title  Pt will be independent with splint wear/care for improved positioning.    Time  4    Period  Weeks    Status  Achieved      OT SHORT TERM GOAL #4   Title  Pt will be able to use LUE as a stabilizer for simple ADL tasks.    Time  4    Period  Weeks    Status  On-going      OT SHORT TERM GOAL #5   Title  Pt will demo at least 25% gross finger flex in prep for functional grasp.    Baseline  4    Time  4    Period  Weeks    Status  Achieved   except index and thumb       OT Long Term Goals - 03/14/20 1209      OT LONG TERM GOAL #1   Title  Pt will be independent with updated HEP.--check LTGs 04/23/20    Time  8    Period  Weeks    Status  New      OT LONG TERM GOAL #2   Title  Pt will be able to use LUE as nondominant assist for ADLs at least 50% of the time.    Time  8    Period  Weeks    Status  New      OT LONG TERM GOAL #3   Title  Pt will be able to score at least 10 on box and blocks test with LUE for improved coordination for ADLs.    Time  8    Period  Weeks    Status  New      OT LONG TERM GOAL #4   Title  Pt will complete clothing fasteners mod I using AE prn.    Time  8    Period  Weeks    Status  New      OT LONG TERM GOAL #5   Title  Pt will demo improved attention/visual scanning to be able to perform environmental scanning in busy enivornment with at least 95% accuracy for incr safety for community activities/driving    Baseline  03/14/20;  58% initially    Time  8    Period  Weeks    Status  New      OT LONG TERM GOAL #6   Title  Pt will demo at least 75% gross finger flex/ext for  grasp/release of objects.    Time  8    Period  Weeks    Status  New            Plan - 04/10/20 1609    Clinical Impression Statement  Pt continues to make progress with Lt wrist and hand getting more consistent wrist extension, however stronger on radial side w/ slight pronation.    Occupational performance deficits (Please refer to evaluation for details):  ADL's;IADL's;Leisure;Social Participation;Work    Marketing executive / Function / Physical Skills  ADL;Dexterity;ROM;IADL;Sensation;Mobility;Strength;FMC;Coordination;Decreased knowledge of precautions;Decreased knowledge of use of DME;Pain;UE functional use;GMC;Proprioception    Cognitive Skills  Attention;Memory;Perception;Safety Awareness    Rehab Potential  Fair    OT Frequency  2x / week    OT Duration  8 weeks    OT Treatment/Interventions  Self-care/ADL training;Moist Heat;Fluidtherapy;DME and/or AE instruction;Splinting;Therapeutic activities;Contrast Bath;Aquatic Therapy;Ultrasound;Therapeutic exercise;Cognitive remediation/compensation;Visual/perceptual remediation/compensation;Passive range of motion;Functional Mobility Training;Neuromuscular education;Cryotherapy;Electrical Stimulation;Paraffin;Energy conservation;Manual Therapy;Patient/family education    Plan  continue Neuro re-educ, NMES to wrist and finger extensors    Consulted and Agree with Plan of Care  Patient       Patient will benefit from skilled therapeutic intervention in order to improve the following deficits and impairments:   Body Structure / Function / Physical Skills: ADL, Dexterity, ROM, IADL, Sensation, Mobility, Strength, FMC, Coordination, Decreased knowledge of precautions, Decreased knowledge of use of DME, Pain, UE functional use, GMC, Proprioception Cognitive Skills: Attention, Memory, Perception, Safety Awareness     Visit Diagnosis: Other lack of coordination  Other symptoms and signs involving the nervous system    Problem  List Patient Active Problem List   Diagnosis Date Noted  . Left hemiparesis ([place]) 03/28/2020  . Chronic bilateral low back pain without sciatica 02/25/2020  . Spasticity as late effect of cerebrovascular accident (CVA) 02/25/2020  . Cognitive and neurobehavioral dysfunction   . Hypertensive urgency 01/28/2020  . Urinary retention 01/28/2020  . Right middle cerebral artery stroke ([place]) 01/28/2020  . Stroke (cerebrum) ([place]) -  [name] 01/25/2020  . Acute respiratory failure with hypoxia ([place])   . Hypotension   . Hypokalemia   . Hypocalcemia   . Encephalopathy acute   . Stroke ([place]) 01/24/2020  . Middle cerebral artery embolism, right 01/24/2020  . Hyperlipidemia 02/21/2019  . Impingement syndrome of right shoulder region 02/21/2018  . OA (osteoarthritis) of hip 01/18/2018  . Prostate cancer ([place]) 03/04/2016  . Essential hypertension 11/27/2014  . History of colonic polyps 09/26/2014  . BACK PAIN, LEFT 12/22/2009  . PROSTATE SPECIFIC ANTIGEN, ELEVATED 12/20/2008  . LATERAL EPICONDYLITIS 07/15/2008  . ACTINIC KERATOSIS, FOREHEAD, LEFT 05/23/2008  . WART, LEFT HAND 07/19/2007  . ERECTILE DYSFUNCTION, MILD 07/19/2007  . HERNIA, BILATERAL INGUINAL W/O OBST/GANGRENE 07/19/2007  . MENISCUS TEAR 07/19/2007  . Bilateral inguinal hernia 07/19/2007    Carey Bullocks, OTR/L 04/10/2020, 5:11 PM  [facility] [address] [place], [place], [address] Phone: [phone]   Fax:  [phone]  Name: Ruben Chavez MRN: [id number] Date of Birth: 1951-08-13

## 2020-04-14 ENCOUNTER — Telehealth: Payer: Self-pay | Admitting: Internal Medicine

## 2020-04-14 NOTE — Telephone Encounter (Signed)
Pt states his PCP is aware of the med since his last stroke. He put on his calendar to stop taking plavix on May 16th and wants to make sure that is correct and why?   Pt can be reached at [phone]

## 2020-04-15 ENCOUNTER — Other Ambulatory Visit: Payer: Self-pay

## 2020-04-15 ENCOUNTER — Ambulatory Visit: Payer: PPO | Admitting: Physical Therapy

## 2020-04-15 ENCOUNTER — Ambulatory Visit: Payer: PPO | Admitting: Occupational Therapy

## 2020-04-15 ENCOUNTER — Encounter: Payer: Self-pay | Admitting: Occupational Therapy

## 2020-04-15 ENCOUNTER — Encounter: Payer: PPO | Admitting: Speech Pathology

## 2020-04-15 DIAGNOSIS — M6281 Muscle weakness (generalized): Secondary | ICD-10-CM

## 2020-04-15 DIAGNOSIS — R29818 Other symptoms and signs involving the nervous system: Secondary | ICD-10-CM

## 2020-04-15 DIAGNOSIS — R41840 Attention and concentration deficit: Secondary | ICD-10-CM

## 2020-04-15 DIAGNOSIS — R414 Neurologic neglect syndrome: Secondary | ICD-10-CM

## 2020-04-15 DIAGNOSIS — I69354 Hemiplegia and hemiparesis following cerebral infarction affecting left non-dominant side: Secondary | ICD-10-CM

## 2020-04-15 DIAGNOSIS — R2681 Unsteadiness on feet: Secondary | ICD-10-CM

## 2020-04-15 DIAGNOSIS — R2689 Other abnormalities of gait and mobility: Secondary | ICD-10-CM

## 2020-04-15 DIAGNOSIS — R208 Other disturbances of skin sensation: Secondary | ICD-10-CM

## 2020-04-15 DIAGNOSIS — R278 Other lack of coordination: Secondary | ICD-10-CM

## 2020-04-15 NOTE — Telephone Encounter (Signed)
Yes stop Plavix May 16th. After stroke plavix and aspirin together for three months then aspirin alone.

## 2020-04-15 NOTE — Patient Instructions (Signed)
     Use your affected hand to perform the following activities for 20-30 minutes 1-2 times/day.  Stop activity if you experience pain.  - Wipe table top - Bring empty plastic cup or bottle to mouth, then return to table top and release.  10x - Flip playing cards--extend fingers and place on top of deck. - Pick up cotton balls, checkers, bottle caps, and/or golf balls and place in a container - Turn doorknob - Open/close cabinet door with handle - Fold towels/wash cloths -  Pick up a pencil    Take splint off 3-4 times per day.  Stretch wrist up with the other hand.  Hold 20sec, Repeat 3x.  Then actively bring wrist up/down 10 times.

## 2020-04-15 NOTE — Telephone Encounter (Signed)
Patient is aware 

## 2020-04-15 NOTE — Therapy (Signed)
[facility] [address] [place], [place], [address] Phone: [phone]   Fax:  [phone]  Physical Therapy Treatment/10th Visit Progress Note  Patient Details  Name: Ruben Chavez MRN: [id number] Date of Birth: [birth year] Referring Provider (PT): Lauraine Rinne, PA-C (pt will be following up with Dr. Dagoberto Ligas)   10th Visit Physical Therapy Progress Note  Dates of Reporting Period: 02/15/20 to 04/15/20    Encounter Date: 04/15/2020  PT End of Session - 04/15/20 1817    Visit Number  10    Number of Visits  17    Date for PT Re-Evaluation  05/15/20   written for 60 day POC   Authorization Type  [employer]    PT Start Time  1449    PT Stop Time  1529    PT Time Calculation (min)  40 min    Equipment Utilized During Treatment  Gait belt    Activity Tolerance  Patient tolerated treatment well    Behavior During Therapy  WFL for tasks assessed/performed       Past Medical History:  Diagnosis Date  . Elevated PSA   . Hyperlipidemia    pt says not anymore  . Hypertension   . Prostate cancer ([place]) 2013    Past Surgical History:  Procedure Laterality Date  . bilateral inguinal hernia  2008  . BUBBLE STUDY  01/28/2020   Procedure: BUBBLE STUDY;  Surgeon: [name], MD;  Location: [facility] ENDOSCOPY;  Service: Cardiovascular;;  . CYST REMOVAL TRUNK  2016   sebaceous cyst  . CYSTOSCOPY  04/22/2016   Procedure: CYSTOSCOPY;  Surgeon: [name], MD;  Location: [facility];  Service: Urology;;  . IR ANGIO VERTEBRAL SEL SUBCLAVIAN INNOMINATE UNI R MOD SED  01/25/2020  . IR CT HEAD LTD  01/25/2020  . IR PERCUTANEOUS ART THROMBECTOMY/INFUSION INTRACRANIAL INC DIAG ANGIO  01/25/2020  . KNEE ARTHROSCOPY  2007   right  . LOOP RECORDER INSERTION N/A 01/28/2020   Procedure: LOOP RECORDER INSERTION;  Surgeon: [name], MD;  Location: [facility];  Service: Cardiovascular;   Laterality: N/A;  . PROSTATE BIOPSY  2008  . PROSTATE BIOPSY  2013  . PROSTATE BIOPSY  01/28/2016  . RADIOACTIVE SEED IMPLANT N/A 04/22/2016   Procedure: RADIOACTIVE SEED IMPLANT/BRACHYTHERAPY IMPLANT;  Surgeon: [name], MD;  Location: [facility];  Service: Urology;  Laterality: N/A;  . RADIOLOGY WITH ANESTHESIA N/A 01/24/2020   Procedure: IR WITH ANESTHESIA;  Surgeon: [name], MD;  Location: [facility];  Service: Radiology;  Laterality: N/A;  . TEE WITHOUT CARDIOVERSION N/A 01/28/2020   Procedure: TRANSESOPHAGEAL ECHOCARDIOGRAM (TEE);  Surgeon: [name], MD;  Location: [facility] ENDOSCOPY;  Service: Cardiovascular;  Laterality: N/A;  . TONSILLECTOMY    . TOTAL HIP ARTHROPLASTY  2001   left  . TOTAL HIP ARTHROPLASTY Right 01/18/2018   Procedure: RIGHT TOTAL HIP ARTHROPLASTY ANTERIOR APPROACH;  Surgeon: [name], MD;  Location: [facility];  Service: Orthopedics;  Laterality: Right;    There were no vitals filed for this visit.  Subjective Assessment - 04/15/20 1451    Subjective  Getting more use of his hand everyday. States he did his corner balance.    Pertinent History  hyperlipidemia, hypertension, prostate cancer with radioactive seed implant, remote tobacco abuse.    Diagnostic tests  Cranial CT scan showed subacute infarction right lateral temporal lobe    Patient Stated Goals  wants to get left hand  and arm working, wants to improve his balance and changing direction (per wife).    Currently in Pain?  No/denies               04/15/20 1457  Transfers  Transfers Sit to Stand;Stand to Sit  Sit to Stand 6: Modified independent (Device/Increase time);Without upper extremity assist;1: +2 Total assist;From chair/3-in-1  Five time sit to stand comments  11.31 seconds  Stand to Sit 6: Modified independent (Device/Increase time);Without upper extremity assist;To chair/3-in-1  Comments 30 second chair stand: 15 sit <> stands  Ambulation/Gait   Ambulation/Gait Yes  Ambulation/Gait Assistance 5: Supervision  Ambulation/Gait Assistance Details gait outdoors over grass working on incr gait speed and head motions/scanning environment with no overt LOB. Pt with no overt LOB. Also performed slow marching for x50' with cues for incr SLS with min guard at times for balance   Ambulation Distance (Feet) 500 Feet  Assistive device None  Gait Pattern Step-through pattern;Decreased step length - right;Decreased step length - left  Ambulation Surface Level;Indoor;Unlevel;Outdoor;Grass  Gait velocity 3.41 ft/sec  Stairs Yes  Stairs Assistance 5: Supervision  Stair Management Technique No rails  Number of Stairs 12  Height of Stairs 6  High Level Balance  High Level Balance Comments SLS: 21 seconds on R, 10 seconds on L             Balance Exercises - 04/15/20 1528      Balance Exercises: Standing   Other Standing Exercises  blue side of BOSU: forward step ups with LLE for SLS and then marching RLE up into hip/knee flexion x10 reps, needing UE support when stepping up, but pt able to let go for a couple of seconds and maintain SLS, min guard for balance. Lateral step ups with LLE x10 reps and marching RLE up into hip/knee flexion, need for UE support throughout. On black side of BOSU - right/left weight shifting with no UE support, cues to maintain balance through balls of feet as pt with tendency to keep weight in heels, x10 reps with min guard/min A for balance.           PT Short Term Goals - 04/15/20 1455      PT SHORT TERM GOAL #1   Title  Pt will be independent with initial HEP in order to build upon functional gains made in therapy. ALL STGS DUE 04/15/20    Time  4   due to delay in scheduling - 1st return visit 03/18/20   Period  Weeks    Status  Achieved    Target Date  04/15/20      PT SHORT TERM GOAL #2   Title  Pt will improve FGA score to at least a 27/30 in order to indicate decr fall risk.    Baseline  28/30  on 04/08/20    Time  4    Period  Weeks    Status  Achieved      PT SHORT TERM GOAL #3   Title  Pt will ambulate at least 300' over level surfaces while scanning environment with supervision and no LOB.    Time  4    Period  Weeks    Status  Achieved      PT SHORT TERM GOAL #4   Title  Pt will decr 5x sit <> stand time to 14 seconds or less in order to indicate improved functional LE strength.    Baseline  16.69 seconds on 02/15/20, 11.31 seconds on  04/15/20    Time  4    Period  Weeks    Status  Achieved      PT SHORT TERM GOAL #5   Title  Pt will improve gait speed to at least 2.9 ft/sec in order to demo improved gait efficiency.    Baseline  3.41 ft/sec on 04/15/20    Time  4    Period  Weeks    Status  Achieved        PT Long Term Goals - 04/15/20 1455      PT LONG TERM GOAL #1   Title  Pt will be independent with final HEP in order to build upon functional gains made in therapy. ALL LTGS DUE 05/13/20    Time  8   due to delay in scheduling   Period  Weeks    Status  On-going      PT LONG TERM GOAL #2   Title  Pt will improve FGA score to at least a 29/30 in order to indicate decr fall risk.    Baseline  28/30    Time  8    Period  Weeks    Status  On-going      PT LONG TERM GOAL #3   Title  30 second chair stand goal to be written as appropriate to demo improved LE strength and endurance.    Baseline  met - performing 15 sit <> stands, in normal age range for age 76, no LTG appropriate.    Time  8    Period  Weeks    Status  Achieved      PT LONG TERM GOAL #4   Title  Pt will improve gait speed to at least 3.4 ft/sec in order to demo improved gait efficiency in the community.    Baseline  3.41 ft/sec on 04/15/20    Time  8    Period  Weeks    Status  Achieved      PT LONG TERM GOAL #5   Title  Pt will ambulate at least 500' over unlevel surfaces with mod I and scanning environment in order to improve community mobility.    Baseline  supervision    Time  8     Period  Weeks    Status  Partially Met      PT LONG TERM GOAL #6   Title  Pt will perform 12 steps with no handrail and step through pattern with supervision in order to indicate improved functional LE strength.    Time  8    Period  Weeks    Status  Achieved             Plan - 04/15/20 1824    Clinical Impression Statement  10th visit progress note: Focus of today's skilled session was assessing pt's goals. Pt has met all STGs. Pt improved his gait speed to 3.41 ft/sec, indicating a community ambulator (previously was 2.5 ft/sec). Pt met STG in relation to 5x sit <> stand. Also assessed 30 second chair stand and pt able to perform 15 sit <> stands with no UE support, which is above age related norms for pt's age. Pt met LTG in regards to performing stairs ascending/descending with reciprocal pattern and no UE support. Pt able to perform higher level balance tasks including scanning environment with supervision on unlevel surfaces outdoors. Pt has made excellent progress with PT - will continue for 2 more visits to continue to progress higher level  balance and to finalize HEP for discharge. Pt in agreement.    Personal Factors and Comorbidities  Comorbidity 3+;Past/Current Experience    Comorbidities  hyperlipidemia, hypertension, prostate cancer with radioactive seed implant, remote tobacco abuse.    Examination-Activity Limitations  Stairs;Locomotion Level    Examination-Participation Restrictions  Community Activity;Driving;Yard Work   being on his boat   Stability/Clinical Decision Making  Stable/Uncomplicated    Rehab Potential  Good    PT Frequency  2x / week    PT Duration  8 weeks    PT Treatment/Interventions  ADLs/Self Care Home Management;Therapeutic activities;Functional mobility training;Stair training;Gait training;Therapeutic exercise;Balance training;Neuromuscular re-education;Patient/family education    PT Next Visit Plan  pt with excellent progress - will only continue  for 2 more visits before D/C. higher level balance activities (SLS, eyes closed), finalizing pt's HEP    PT Home Exercise Plan  [id number]    Consulted and Agree with Plan of Care  Patient       Patient will benefit from skilled therapeutic intervention in order to improve the following deficits and impairments:  Abnormal gait, Decreased activity tolerance, Decreased balance, Decreased safety awareness, Difficulty walking, Decreased strength, Impaired UE functional use  Visit Diagnosis: Other lack of coordination  Other symptoms and signs involving the nervous system  Other abnormalities of gait and mobility  Muscle weakness (generalized)  Hemiplegia and hemiparesis following cerebral infarction affecting left non-dominant side [facility])     Problem List Patient Active Problem List   Diagnosis Date Noted  . Left hemiparesis ([place]) 03/28/2020  . Chronic bilateral low back pain without sciatica 02/25/2020  . Spasticity as late effect of cerebrovascular accident (CVA) 02/25/2020  . Cognitive and neurobehavioral dysfunction   . Hypertensive urgency 01/28/2020  . Urinary retention 01/28/2020  . Right middle cerebral artery stroke ([place]) 01/28/2020  . Stroke (cerebrum) ([place]) -  [name] 01/25/2020  . Acute respiratory failure with hypoxia ([place])   . Hypotension   . Hypokalemia   . Hypocalcemia   . Encephalopathy acute   . Stroke ([place]) 01/24/2020  . Middle cerebral artery embolism, right 01/24/2020  . Hyperlipidemia 02/21/2019  . Impingement syndrome of right shoulder region 02/21/2018  . OA (osteoarthritis) of hip 01/18/2018  . Prostate cancer ([place]) 03/04/2016  . Essential hypertension 11/27/2014  . History of colonic polyps 09/26/2014  . BACK PAIN, LEFT 12/22/2009  . PROSTATE SPECIFIC ANTIGEN, ELEVATED 12/20/2008  . LATERAL EPICONDYLITIS 07/15/2008  . ACTINIC KERATOSIS,  FOREHEAD, LEFT 05/23/2008  . WART, LEFT HAND 07/19/2007  . ERECTILE DYSFUNCTION, MILD 07/19/2007  . HERNIA, BILATERAL INGUINAL W/O OBST/GANGRENE 07/19/2007  . MENISCUS TEAR 07/19/2007  . Bilateral inguinal hernia 07/19/2007    Arliss Journey, PT, DPT  04/15/2020, 6:25 PM  [facility] [address] [place], [place], [address] Phone: [phone]   Fax:  [phone]  Name: Ruben Chavez MRN: [id number] Date of Birth: Mar 30, 1951

## 2020-04-15 NOTE — Therapy (Addendum)
[place] [address] [place], [place], [address] Phone: [phone]   Fax:  [phone]  Occupational Therapy Treatment  Patient Details  Name: Ruben Chavez MRN: [id number] Date of Birth: 1951/02/21 Referring Provider (OT): Lauraine Rinne, PA-C   Encounter Date: 04/15/2020  OT End of Session - 04/15/20 1337    Visit Number  14    Number of Visits  17    Date for OT Re-Evaluation  04/22/20    Authorization Type  [employer] (follow Medicare)--1 copay for multiple visits    Authorization Time Period  cert date 12/14/30-3/55/73    Authorization - Visit Number  14    Authorization - Number of Visits  20    Progress Note Due on Visit  20    OT Start Time  1330   arrived late   OT Stop Time  1420    OT Time Calculation (min)  50 min    Activity Tolerance  Patient tolerated treatment well    Behavior During Therapy  [facility] for tasks assessed/performed       Past Medical History:  Diagnosis Date  . Elevated PSA   . Hyperlipidemia    pt says not anymore  . Hypertension   . Prostate cancer ([place]) 2013    Past Surgical History:  Procedure Laterality Date  . bilateral inguinal hernia  2008  . BUBBLE STUDY  01/28/2020   Procedure: BUBBLE STUDY;  Surgeon: [name], MD;  Location: [facility] ENDOSCOPY;  Service: Cardiovascular;;  . CYST REMOVAL TRUNK  2016   sebaceous cyst  . CYSTOSCOPY  04/22/2016   Procedure: CYSTOSCOPY;  Surgeon: [name], MD;  Location: [facility];  Service: Urology;;  . IR ANGIO VERTEBRAL SEL SUBCLAVIAN INNOMINATE UNI R MOD SED  01/25/2020  . IR CT HEAD LTD  01/25/2020  . IR PERCUTANEOUS ART THROMBECTOMY/INFUSION INTRACRANIAL INC DIAG ANGIO  01/25/2020  . KNEE ARTHROSCOPY  2007   right  . LOOP RECORDER INSERTION N/A 01/28/2020   Procedure: LOOP RECORDER INSERTION;  Surgeon: [name], MD;  Location: [place] CV LAB;  Service: Cardiovascular;  Laterality: N/A;  .  PROSTATE BIOPSY  2008  . PROSTATE BIOPSY  2013  . PROSTATE BIOPSY  01/28/2016  . RADIOACTIVE SEED IMPLANT N/A 04/22/2016   Procedure: RADIOACTIVE SEED IMPLANT/BRACHYTHERAPY IMPLANT;  Surgeon: [name], MD;  Location: [facility];  Service: Urology;  Laterality: N/A;  . RADIOLOGY WITH ANESTHESIA N/A 01/24/2020   Procedure: IR WITH ANESTHESIA;  Surgeon: [name], MD;  Location: [place];  Service: Radiology;  Laterality: N/A;  . TEE WITHOUT CARDIOVERSION N/A 01/28/2020   Procedure: TRANSESOPHAGEAL ECHOCARDIOGRAM (TEE);  Surgeon: [name], MD;  Location: [facility] ENDOSCOPY;  Service: Cardiovascular;  Laterality: N/A;  . TONSILLECTOMY    . TOTAL HIP ARTHROPLASTY  2001   left  . TOTAL HIP ARTHROPLASTY Right 01/18/2018   Procedure: RIGHT TOTAL HIP ARTHROPLASTY ANTERIOR APPROACH;  Surgeon: [name], MD;  Location: [facility] ORS;  Service: Orthopedics;  Laterality: Right;    There were no vitals filed for this visit.  Subjective Assessment - 04/15/20 1332    Subjective   pt reports ability to touch finger to thumb now    Pertinent History  PMH:  hyperlipidemia, hypertension, prostate cancer radioactive seed implant 2017, R total hip arthroplasty, loop recorder insertion    Limitations  loop recorder    Patient Stated Goals  get my L hand and fingers working  Currently in Pain?  No/denies         Pt reports that he has attempted to resume stretches that he did prior to CVA including reaching to ceiling and touching floor.  Pt demonstrated.  Pt given mod cueing initially to clasp hands and only raise arms to shoulder height then to toes (thumbs up) as pt hikes shoulder/IR when going above shoulder height.  Pt also reports/demo stretching BUEs in adduction, mod cueing given for shoulder positioning LUE and staying in low range.  Mirror also used for cueing.  Reviewed proper positioning of LUE with functional reach.  Pt returned demo with mod cueing.   Discussed  increasing functional use of LUE for low-range activities such as washing arm/chest (not washing hair).  Recommended using LUE as a stabilizer for opening bottles.  Pt verbalized understanding.  Removing cylinder/wooden pegs from pegboard with mod cueing initially for positioning.  Then improved with repetition.  Pt able to use 2 point pinch today.     OT Education - 04/15/20 1427    Education Details  Updates to HEP (functional use)--see pt instructions.  Pt instructed to only attempt functional tasks at low-mid range (shoulder height) and to continue with previous HEP.  Reviewed importance of wrist ROM with new wrist movement--see pt instructions.    Person(s) Educated  Patient    Methods  Explanation;Demonstration;Handout;Tactile cues;Verbal cues    Comprehension  Verbal cues required;Verbalized understanding;Returned demonstration;Tactile cues required;Need further instruction       OT Short Term Goals - 04/10/20 1609      OT SHORT TERM GOAL #1   Title  Pt will be independent with initial HEP--check STG 03/24/20    Time  4    Period  Weeks    Status  Achieved      OT SHORT TERM GOAL #2   Title  Pt will verbalize understanding of proper positioning of LUE to prevent pain/injury and edema management techniques.    Time  4    Period  Weeks    Status  Achieved      OT SHORT TERM GOAL #3   Title  Pt will be independent with splint wear/care for improved positioning.    Time  4    Period  Weeks    Status  Achieved      OT SHORT TERM GOAL #4   Title  Pt will be able to use LUE as a stabilizer for simple ADL tasks.    Time  4    Period  Weeks    Status  On-going      OT SHORT TERM GOAL #5   Title  Pt will demo at least 25% gross finger flex in prep for functional grasp.    Baseline  4    Time  4    Period  Weeks    Status  Achieved   except index and thumb       OT Long Term Goals - 03/14/20 1209      OT LONG TERM GOAL #1   Title  Pt will be independent with updated  HEP.--check LTGs 04/23/20    Time  8    Period  Weeks    Status  New      OT LONG TERM GOAL #2   Title  Pt will be able to use LUE as nondominant assist for ADLs at least 50% of the time.    Time  8    Period  Weeks  Status  New      OT LONG TERM GOAL #3   Title  Pt will be able to score at least 10 on box and blocks test with LUE for improved coordination for ADLs.    Time  8    Period  Weeks    Status  New      OT LONG TERM GOAL #4   Title  Pt will complete clothing fasteners mod I using AE prn.    Time  8    Period  Weeks    Status  New      OT LONG TERM GOAL #5   Title  Pt will demo improved attention/visual scanning to be able to perform environmental scanning in busy enivornment with at least 95% accuracy for incr safety for community activities/driving    Baseline  03/14/20;  58% initially    Time  8    Period  Weeks    Status  New      OT LONG TERM GOAL #6   Title  Pt will demo at least 75% gross finger flex/ext for grasp/release of objects.    Time  8    Period  Weeks    Status  New            Plan - 04/15/20 1351    Clinical Impression Statement  Pt is progressing towards goals with improving LUE functional use.  Pt is more consistent with wrist/finger movement and is able to perform 2point pinch today.  Decr proprioception/body awareness and cognitive deficits continue to be barriers.    Occupational performance deficits (Please refer to evaluation for details):  ADL's;IADL's;Leisure;Social Participation;Work    Marketing executive / Function / Physical Skills  ADL;Dexterity;ROM;IADL;Sensation;Mobility;Strength;FMC;Coordination;Decreased knowledge of precautions;Decreased knowledge of use of DME;Pain;UE functional use;GMC;Proprioception    Cognitive Skills  Attention;Memory;Perception;Safety Awareness    Rehab Potential  Fair    OT Frequency  2x / week    OT Duration  8 weeks    OT Treatment/Interventions  Self-care/ADL training;Moist Heat;Fluidtherapy;DME  and/or AE instruction;Splinting;Therapeutic activities;Contrast Bath;Aquatic Therapy;Ultrasound;Therapeutic exercise;Cognitive remediation/compensation;Visual/perceptual remediation/compensation;Passive range of motion;Functional Mobility Training;Neuromuscular education;Cryotherapy;Electrical Stimulation;Paraffin;Energy conservation;Manual Therapy;Patient/family education    Plan  review updates to HEP prn, continue Neuro re-educ, NMES to wrist and finger extensors; schedule additional appts through June    Consulted and Agree with Plan of Care  Patient       Patient will benefit from skilled therapeutic intervention in order to improve the following deficits and impairments:   Body Structure / Function / Physical Skills: ADL, Dexterity, ROM, IADL, Sensation, Mobility, Strength, FMC, Coordination, Decreased knowledge of precautions, Decreased knowledge of use of DME, Pain, UE functional use, GMC, Proprioception Cognitive Skills: Attention, Memory, Perception, Safety Awareness     Visit Diagnosis: Hemiplegia and hemiparesis following cerebral infarction affecting left non-dominant side (HCC)  Other symptoms and signs involving the nervous system  Other lack of coordination  Unsteadiness on feet  Attention and concentration deficit  Other abnormalities of gait and mobility  Other disturbances of skin sensation  Neurologic neglect syndrome    Problem List Patient Active Problem List   Diagnosis Date Noted  . Left hemiparesis ([place]) 03/28/2020  . Chronic bilateral low back pain without sciatica 02/25/2020  . Spasticity as late effect of cerebrovascular accident (CVA) 02/25/2020  . Cognitive and neurobehavioral dysfunction   . Hypertensive urgency 01/28/2020  . Urinary retention 01/28/2020  . Right middle cerebral artery stroke ([place]) 01/28/2020  . Stroke (cerebrum) (HCC) -  R MAC  infarct due to R MCA occlusion s/p tPA and IR with TICI2b recanalization - embolic secondary to  unknown source 01/25/2020  . Acute respiratory failure with hypoxia ([place])   . Hypotension   . Hypokalemia   . Hypocalcemia   . Encephalopathy acute   . Stroke ([place]) 01/24/2020  . Middle cerebral artery embolism, right 01/24/2020  . Hyperlipidemia 02/21/2019  . Impingement syndrome of right shoulder region 02/21/2018  . OA (osteoarthritis) of hip 01/18/2018  . Prostate cancer ([place]) 03/04/2016  . Essential hypertension 11/27/2014  . History of colonic polyps 09/26/2014  . BACK PAIN, LEFT 12/22/2009  . PROSTATE SPECIFIC ANTIGEN, ELEVATED 12/20/2008  . LATERAL EPICONDYLITIS 07/15/2008  . ACTINIC KERATOSIS, FOREHEAD, LEFT 05/23/2008  . WART, LEFT HAND 07/19/2007  . ERECTILE DYSFUNCTION, MILD 07/19/2007  . HERNIA, BILATERAL INGUINAL W/O OBST/GANGRENE 07/19/2007  . MENISCUS TEAR 07/19/2007  . Bilateral inguinal hernia 07/19/2007    [facility] 04/15/2020, 2:45 PM  [place] [address] [place], [place], [address] Phone: [phone]   Fax:  [phone]  Name: Ruben Chavez MRN: [id number] Date of Birth: 10-12-1951   Vianne Bulls, OTR/L [facility] [address] [place], [place]  [address] [phone] phone [phone] 04/15/20 2:45 PM

## 2020-04-16 ENCOUNTER — Ambulatory Visit (HOSPITAL_COMMUNITY)
Admission: RE | Admit: 2020-04-16 | Discharge: 2020-04-16 | Disposition: A | Discharge status: Home / Self Care | Payer: PPO | Source: Ambulatory Visit | Attending: Interventional Radiology | Admitting: Interventional Radiology

## 2020-04-16 DIAGNOSIS — I639 Cerebral infarction, unspecified: Secondary | ICD-10-CM

## 2020-04-16 NOTE — Progress Notes (Signed)
Chief Complaint: Patient was seen in consultation today for CVA s/p revascularization.  Referring Physician(s): Code Stroke- Kerney Elbe  Supervising Physician: [name]  Patient Status: [facility] - Out-pt  History of Present Illness: Ruben Chavez is a 69 y.o. male with a past medical history as below, with pertinent past medical history including hypertension, hyperlipidemia, CVA 01/2020, and prostate cancer. He is known to [facility] and has been followed by Dr. Estanislado Pandy since 01/2020. He first presented to our department as an active code stroke at the request of Dr. Cheral Marker. He underwent an image-guided cerebral arteriogram with emergent mechanical thrombectomy of right MCA inferior division occlusion achieving a TICI 2b revascularization 01/24/2020 by Dr. Estanislado Pandy. He was discharged to [facility] 01/28/2020 and discharged home 02/13/2020 in stable condition.  Patient presents today for follow-up regarding his recent procedure 01/24/2020. Patient awake and alert sitting in chair. Accompanied by wife. Complains of LUE weakness, improved since discharge. States he is seeing OT for this. Denies headache, numbness/tingling, dizziness, vision changes, hearing changes, tinnitus, or speech difficulty.  Currently taking Plavix 75 mg once daily and Aspirin 325 mg once daily.   Past Medical History:  Diagnosis Date  . Elevated PSA   . Hyperlipidemia    pt says not anymore  . Hypertension   . Prostate cancer ([place]) 2013    Past Surgical History:  Procedure Laterality Date  . bilateral inguinal hernia  2008  . BUBBLE STUDY  01/28/2020   Procedure: BUBBLE STUDY;  Surgeon: [name], MD;  Location: [facility] ENDOSCOPY;  Service: Cardiovascular;;  . CYST REMOVAL TRUNK  2016   sebaceous cyst  . CYSTOSCOPY  04/22/2016   Procedure: CYSTOSCOPY;  Surgeon: [name], MD;  Location: [facility];  Service: Urology;;  . IR ANGIO VERTEBRAL SEL SUBCLAVIAN INNOMINATE UNI R MOD SED   01/25/2020  . IR CT HEAD LTD  01/25/2020  . IR PERCUTANEOUS ART THROMBECTOMY/INFUSION INTRACRANIAL INC DIAG ANGIO  01/25/2020  . KNEE ARTHROSCOPY  2007   right  . LOOP RECORDER INSERTION N/A 01/28/2020   Procedure: LOOP RECORDER INSERTION;  Surgeon: [name], MD;  Location: [place] CV LAB;  Service: Cardiovascular;  Laterality: N/A;  . PROSTATE BIOPSY  2008  . PROSTATE BIOPSY  2013  . PROSTATE BIOPSY  01/28/2016  . RADIOACTIVE SEED IMPLANT N/A 04/22/2016   Procedure: RADIOACTIVE SEED IMPLANT/BRACHYTHERAPY IMPLANT;  Surgeon: [name], MD;  Location: [facility];  Service: Urology;  Laterality: N/A;  . RADIOLOGY WITH ANESTHESIA N/A 01/24/2020   Procedure: IR WITH ANESTHESIA;  Surgeon: [name], MD;  Location: [place];  Service: Radiology;  Laterality: N/A;  . TEE WITHOUT CARDIOVERSION N/A 01/28/2020   Procedure: TRANSESOPHAGEAL ECHOCARDIOGRAM (TEE);  Surgeon: [name], MD;  Location: [facility] ENDOSCOPY;  Service: Cardiovascular;  Laterality: N/A;  . TONSILLECTOMY    . TOTAL HIP ARTHROPLASTY  2001   left  . TOTAL HIP ARTHROPLASTY Right 01/18/2018   Procedure: RIGHT TOTAL HIP ARTHROPLASTY ANTERIOR APPROACH;  Surgeon: [name], MD;  Location: [facility];  Service: Orthopedics;  Laterality: Right;    Allergies: Sulfa antibiotics, Advil [ibuprofen], Aleve [naproxen], Betadine [povidone iodine], Chloroxylenol (antiseptic), Poison ivy extract, and Povidone-iodine  Medications: Prior to Admission medications   Medication Sig Start Date End Date Taking? Authorizing Provider  acetaminophen (TYLENOL) 325 MG tablet Take 2 tablets (650 mg total) by mouth every 4 (four) hours as needed for mild pain (or temp > 37.5 C (99.5 F)). 02/12/20   [place],  Lavon Paganini, PA-C  aspirin EC 325 MG EC tablet Take 1 tablet (325 mg total) by mouth daily. 01/29/20   [name], NP  atorvastatin (LIPITOR) 20 MG tablet TAKE 1 TABLET(20 MG) BY MOUTH DAILY AT 6 PM 03/11/20   Isaac Bliss, Rayford Halsted, MD  buPROPion (WELLBUTRIN) 75 MG tablet TAKE 1 TABLET(75 MG) BY MOUTH TWICE DAILY AT 10 AM AND AT 4 PM 04/08/20   Isaac Bliss, Rayford Halsted, MD  clopidogrel (PLAVIX) 75 MG tablet TAKE 1 TABLET(75 MG) BY MOUTH DAILY 03/11/20   Isaac Bliss, Rayford Halsted, MD  folic acid (FOLVITE) 1 MG tablet TAKE 1 TABLET(1 MG) BY MOUTH DAILY 04/08/20   Isaac Bliss, Rayford Halsted, MD  lisinopril-hydrochlorothiazide (ZESTORETIC) 20-25 MG tablet Take 1 tablet by mouth daily.    [provider]  magnesium 30 MG tablet Take 250 mg by mouth 1 day or 1 dose.    [provider]  Multiple Vitamin (MULTIVITAMIN WITH MINERALS) TABS tablet Take 1 tablet by mouth daily. 01/29/20   [name], NP  pantoprazole (PROTONIX) 40 MG tablet TAKE 1 TABLET(40 MG) BY MOUTH DAILY 03/11/20   Isaac Bliss, Rayford Halsted, MD  senna-docusate (SENOKOT-S) 8.6-50 MG tablet Take 2 tablets by mouth daily. 02/13/20   [place], Lavon Paganini, PA-C  traMADol (ULTRAM) 50 MG tablet TAKE 1 TABLET(50 MG) BY MOUTH EVERY 6 HOURS AS NEEDED FOR MODERATE PAIN 04/08/20   Isaac Bliss, Rayford Halsted, MD  traZODone (DESYREL) 100 MG tablet TAKE 1 TABLET(100 MG) BY MOUTH AT BEDTIME 04/08/20   Isaac Bliss, Rayford Halsted, MD     Family History  Problem Relation Age of Onset  . Pancreatic cancer Mother   . Heart disease Father   . Colon cancer Neg Hx     Social History   Socioeconomic History  . Marital status: Married    Spouse name: Not on file  . Number of children: Not on file  . Years of education: Not on file  . Highest education level: Not on file  Occupational History  . Not on file  Tobacco Use  . Smoking status: Former Smoker    Packs/day: 1.00    Years: 32.00    Pack years: 32.00    Types: Cigarettes    Quit date: 06/06/1999    Years since quitting: 20.8  . Smokeless tobacco: Never Used  Substance and Sexual Activity  . Alcohol use: Yes    Alcohol/week: 14.0 standard drinks    Types: 14 Shots of liquor per week  .  Drug use: No  . Sexual activity: Yes  Other Topics Concern  . Not on file  Social History Narrative  . Not on file   Social Determinants of Health   Financial Resource Strain:   . Difficulty of Paying Living Expenses:   Food Insecurity:   . Worried About Charity fundraiser in the Last Year:   . Arboriculturist in the Last Year:   Transportation Needs:   . Film/video editor (Medical):   Marland Kitchen Lack of Transportation (Non-Medical):   Physical Activity:   . Days of Exercise per Week:   . Minutes of Exercise per Session:   Stress:   . Feeling of Stress :   Social Connections:   . Frequency of Communication with Friends and Family:   . Frequency of Social Gatherings with Friends and Family:   . Attends Religious Services:   . Active Member of Clubs or Organizations:   .  Attends Archivist Meetings:   Marland Kitchen Marital Status:      Review of Systems: A 12 point ROS discussed and pertinent positives are indicated in the HPI above.  All other systems are negative.  Review of Systems  Constitutional: Negative for chills and fever.  HENT: Negative for hearing loss and tinnitus.   Eyes: Negative for visual disturbance.  Respiratory: Negative for shortness of breath and wheezing.   Cardiovascular: Negative for chest pain and palpitations.  Neurological: Positive for weakness. Negative for dizziness, speech difficulty, numbness and headaches.  Psychiatric/Behavioral: Negative for behavioral problems and confusion.    Vital Signs: There were no vitals taken for this visit.  Physical Exam Constitutional:      General: He is not in acute distress.    Appearance: Normal appearance.  Pulmonary:     Effort: Pulmonary effort is normal. No respiratory distress.  Musculoskeletal:     Comments: Can spontaneously move lower extremities and RUE; able to lift LUE over head, able to bend LUE fingers but cannot make complete fist or fully straighten.  Skin:    General: Skin is warm and  dry.  Neurological:     Mental Status: He is alert and oriented to person, place, and time.      Imaging: CUP PACEART REMOTE DEVICE CHECK  Result Date: 04/07/2020 Carelink summary report received. Battery status OK. Normal device function. No new symptom episodes, tachy episodes, brady, or pause episodes. No new AF episodes. Monthly summary reports and ROV/PRN. Felisa Bonier, RN, MSN   Labs:  CBC: Recent Labs    01/28/20 0400 01/29/20 0601 02/05/20 0915 02/11/20 0520  WBC 6.1 5.8 8.2 6.0  HGB 9.9* 10.2* 10.7* 10.7*  HCT 29.4* 30.3* 32.9* 32.3*  PLT 169 204 465* 471*    COAGS: Recent Labs    01/24/20 2006  INR 1.0  APTT 27    BMP: Recent Labs    01/28/20 0400 01/28/20 0400 01/29/20 0601 02/04/20 0628 02/05/20 0915 02/11/20 0520  NA 138  --  139  --  137 140  K 3.7  --  3.7  --  3.7 3.8  CL 108  --  107  --  106 108  CO2 24  --  23  --  24 24  GLUCOSE 111*  --  103*  --  99 96  BUN 11  --  12  --  15 16  CALCIUM 8.0*  --  8.5*  --  8.7* 8.9  CREATININE 0.90   < > 0.88 0.95 0.91 0.89  GFRNONAA >60   < > >60 >60 >60 >60  GFRAA >60   < > >60 >60 >60 >60   < > = values in this interval not displayed.    LIVER FUNCTION TESTS: Recent Labs    10/09/19 1234 01/24/20 2006 01/29/20 0601  BILITOT 0.7 1.1 0.4  AST 25 50* 26  ALT 26 55* 26  ALKPHOS 46 40 35*  PROT 7.3 7.3 5.4*  ALBUMIN 4.6 4.2 2.7*     Assessment and Plan:  History of acute CVA s/p cerebral arteriogram with emergent mechanical thrombectomy of right MCA inferior division occlusion achieving a TICI 2b revascularization 01/24/2020 by Dr. Estanislado Pandy, Dr. Estanislado Pandy was present for consultation.  Discussed post-stroke course. Patient states that he is doing well and his LUE continues to improve daily. Instructed patient to continue with OT.  Discussed endovascular procedure. Recommend follow-up with CTA head/neck to evaluate right MCA. Plan for follow-up with CTA  head/neck (with contrast) 3 months  from procedure 01/24/2020. Informed patient that our schedulers will call him to set up this imaging scan.  Discussed secondary stroke prevention. Instructed patient to follow-up with his neurologist regularly. Instructed patient to follow-up with PCP regularly regarding hypertension/hyperlipidemia management. Instructed patient to continue taking Plavix 75 mg once daily (for 3 months post-stroke per neurology) and Aspirin 325 mg once daily.  All questions answered and concerns addressed. Patient and wife convey understanding and agree with plan.  Thank you for this interesting consult.  I greatly enjoyed meeting [employer] and look forward to participating in their care.  A copy of this report was sent to the requesting provider on this date.  Electronically Signed: Earley Abide, PA-C 04/16/2020, 9:47 AM   I spent a total of 40 Minutes in face to face in clinical consultation, greater than 50% of which was counseling/coordinating care for CVA s/p revascularization.

## 2020-04-17 ENCOUNTER — Other Ambulatory Visit: Payer: Self-pay

## 2020-04-17 ENCOUNTER — Ambulatory Visit: Payer: PPO | Admitting: Speech Pathology

## 2020-04-17 ENCOUNTER — Ambulatory Visit: Payer: PPO

## 2020-04-17 ENCOUNTER — Ambulatory Visit: Payer: PPO | Admitting: Occupational Therapy

## 2020-04-17 DIAGNOSIS — R278 Other lack of coordination: Secondary | ICD-10-CM

## 2020-04-17 DIAGNOSIS — R2689 Other abnormalities of gait and mobility: Secondary | ICD-10-CM

## 2020-04-17 DIAGNOSIS — M6281 Muscle weakness (generalized): Secondary | ICD-10-CM

## 2020-04-17 DIAGNOSIS — R41841 Cognitive communication deficit: Secondary | ICD-10-CM

## 2020-04-17 DIAGNOSIS — I69354 Hemiplegia and hemiparesis following cerebral infarction affecting left non-dominant side: Secondary | ICD-10-CM

## 2020-04-17 DIAGNOSIS — R29818 Other symptoms and signs involving the nervous system: Secondary | ICD-10-CM

## 2020-04-17 NOTE — Therapy (Signed)
[facility] [address] [place], [place], [address] Phone: [phone]   Fax:  [phone]  Physical Therapy Treatment  Patient Details  Name: Ruben Chavez MRN: [id number] Date of Birth: August 15, 1951 Referring Provider (PT): Lauraine Rinne, PA-C (pt will be following up with Dr. Dagoberto Ligas)   Encounter Date: 04/17/2020  PT End of Session - 04/17/20 1535    Visit Number  11    Number of Visits  17    Date for PT Re-Evaluation  05/15/20   written for 60 day POC   Authorization Type  [employer]    PT Start Time  1534    PT Stop Time  1615    PT Time Calculation (min)  41 min    Equipment Utilized During Treatment  Gait belt    Activity Tolerance  Patient tolerated treatment well    Behavior During Therapy  [facility] for tasks assessed/performed       Past Medical History:  Diagnosis Date  . Elevated PSA   . Hyperlipidemia    pt says not anymore  . Hypertension   . Prostate cancer ([place]) 2013    Past Surgical History:  Procedure Laterality Date  . bilateral inguinal hernia  2008  . BUBBLE STUDY  01/28/2020   Procedure: BUBBLE STUDY;  Surgeon: [name], MD;  Location: [facility] ENDOSCOPY;  Service: Cardiovascular;;  . CYST REMOVAL TRUNK  2016   sebaceous cyst  . CYSTOSCOPY  04/22/2016   Procedure: CYSTOSCOPY;  Surgeon: [name], MD;  Location[facility];  Service: Urology;;  . IR ANGIO VERTEBRAL SEL SUBCLAVIAN INNOMINATE UNI R MOD SED  01/25/2020  . IR CT HEAD LTD  01/25/2020  . IR PERCUTANEOUS ART THROMBECTOMY/INFUSION INTRACRANIAL INC DIAG ANGIO  01/25/2020  . KNEE ARTHROSCOPY  2007   right  . LOOP RECORDER INSERTION N/A 01/28/2020   Procedure: LOOP RECORDER INSERTION;  Surgeon: [name], MD;  Location: [place] CV LAB;  Service: Cardiovascular;  Laterality: N/A;  . PROSTATE BIOPSY  2008  . PROSTATE BIOPSY  2013  . PROSTATE BIOPSY  01/28/2016  . RADIOACTIVE SEED IMPLANT  N/A 04/22/2016   Procedure: RADIOACTIVE SEED IMPLANT/BRACHYTHERAPY IMPLANT;  Surgeon: [name], MD;  Location: [facility];  Service: Urology;  Laterality: N/A;  . RADIOLOGY WITH ANESTHESIA N/A 01/24/2020   Procedure: IR WITH ANESTHESIA;  Surgeon: [name], MD;  Location: [place];  Service: Radiology;  Laterality: N/A;  . TEE WITHOUT CARDIOVERSION N/A 01/28/2020   Procedure: TRANSESOPHAGEAL ECHOCARDIOGRAM (TEE);  Surgeon: [name], MD;  Location: [facility] ENDOSCOPY;  Service: Cardiovascular;  Laterality: N/A;  . TONSILLECTOMY    . TOTAL HIP ARTHROPLASTY  2001   left  . TOTAL HIP ARTHROPLASTY Right 01/18/2018   Procedure: RIGHT TOTAL HIP ARTHROPLASTY ANTERIOR APPROACH;  Surgeon: [name], MD;  Location: [facility];  Service: Orthopedics;  Laterality: Right;    There were no vitals filed for this visit.  Subjective Assessment - 04/17/20 1536    Subjective  Pt reports he is doing well.    Pertinent History  hyperlipidemia, hypertension, prostate cancer with radioactive seed implant, remote tobacco abuse.    Diagnostic tests  Cranial CT scan showed subacute infarction right lateral temporal lobe    Patient Stated Goals  wants to get left hand and arm working, wants to improve his balance and changing direction (per wife).    Currently in Pain?  No/denies  [facility] Adult PT Treatment/Exercise - 04/17/20 1536      Ambulation/Gait   Ambulation/Gait  Yes    Ambulation/Gait Assistance Details  around gym for activities independently.    Assistive device  None      Neuro Re-ed    Neuro Re-ed Details   Standing at bottom of stairs: alternating toe taps on bottom step x 10 then performed standing on airex x 10. SLS x 10 sec on airex on RLE then 10 sec x 2 on LLE with occasional UE support. SLS on ground on left x 10 sec. Standing on dynadisc x 30 sec, standing on blue side of BOSU x 30 sec then turned to flat back side to stand on  and performed mine-squats x 10. Close SBA with activities.  Dynamic gait in hallway: marching gait 30' x 2, backwards gait 30' x 2, side stepping 30' x 2, tandem gait 30' x 2 CGA.             PT Education - 04/17/20 1857    Education Details  Added to balance HEP. Discussed likely d/c next visit.    Person(s) Educated  Patient    Methods  Explanation;Demonstration;Handout    Comprehension  Verbalized understanding       PT Short Term Goals - 04/15/20 1455      PT SHORT TERM GOAL #1   Title  Pt will be independent with initial HEP in order to build upon functional gains made in therapy. ALL STGS DUE 04/15/20    Time  4   due to delay in scheduling - 1st return visit 03/18/20   Period  Weeks    Status  Achieved    Target Date  04/15/20      PT SHORT TERM GOAL #2   Title  Pt will improve FGA score to at least a 27/30 in order to indicate decr fall risk.    Baseline  28/30 on 04/08/20    Time  4    Period  Weeks    Status  Achieved      PT SHORT TERM GOAL #3   Title  Pt will ambulate at least 300' over level surfaces while scanning environment with supervision and no LOB.    Time  4    Period  Weeks    Status  Achieved      PT SHORT TERM GOAL #4   Title  Pt will decr 5x sit <> stand time to 14 seconds or less in order to indicate improved functional LE strength.    Baseline  16.69 seconds on 02/15/20, 11.31 seconds on 04/15/20    Time  4    Period  Weeks    Status  Achieved      PT SHORT TERM GOAL #5   Title  Pt will improve gait speed to at least 2.9 ft/sec in order to demo improved gait efficiency.    Baseline  3.41 ft/sec on 04/15/20    Time  4    Period  Weeks    Status  Achieved        PT Long Term Goals - 04/15/20 1455      PT LONG TERM GOAL #1   Title  Pt will be independent with final HEP in order to build upon functional gains made in therapy. ALL LTGS DUE 05/13/20    Time  8   due to delay in scheduling   Period  Weeks    Status  On-going  PT LONG  TERM GOAL #2   Title  Pt will improve FGA score to at least a 29/30 in order to indicate decr fall risk.    Baseline  28/30    Time  8    Period  Weeks    Status  On-going      PT LONG TERM GOAL #3   Title  30 second chair stand goal to be written as appropriate to demo improved LE strength and endurance.    Baseline  met - performing 15 sit <> stands, in normal age range for age 63, no LTG appropriate.    Time  8    Period  Weeks    Status  Achieved      PT LONG TERM GOAL #4   Title  Pt will improve gait speed to at least 3.4 ft/sec in order to demo improved gait efficiency in the community.    Baseline  3.41 ft/sec on 04/15/20    Time  8    Period  Weeks    Status  Achieved      PT LONG TERM GOAL #5   Title  Pt will ambulate at least 500' over unlevel surfaces with mod I and scanning environment in order to improve community mobility.    Baseline  supervision    Time  8    Period  Weeks    Status  Partially Met      PT LONG TERM GOAL #6   Title  Pt will perform 12 steps with no handrail and step through pattern with supervision in order to indicate improved functional LE strength.    Time  8    Period  Weeks    Status  Achieved            Plan - 04/17/20 1857    Clinical Impression Statement  PT continues with working more on SLS activities and added to balance HEP. Pt did well and continues to show improvements needing less UE support periods.    Personal Factors and Comorbidities  Comorbidity 3+;Past/Current Experience    Comorbidities  hyperlipidemia, hypertension, prostate cancer with radioactive seed implant, remote tobacco abuse.    Examination-Activity Limitations  Stairs;Locomotion Level    Examination-Participation Restrictions  Community Activity;Driving;Yard Work   being on his boat   Stability/Clinical Decision Making  Stable/Uncomplicated    Rehab Potential  Good    PT Frequency  2x / week    PT Duration  8 weeks    PT Treatment/Interventions   ADLs/Self Care Home Management;Therapeutic activities;Functional mobility training;Stair training;Gait training;Therapeutic exercise;Balance training;Neuromuscular re-education;Patient/family education    PT Next Visit Plan  higher level balance activities (SLS, eyes closed), finalizing pt's HEP for discharge next visit.    PT Home Exercise Plan  [id number]    Consulted and Agree with Plan of Care  Patient       Patient will benefit from skilled therapeutic intervention in order to improve the following deficits and impairments:  Abnormal gait, Decreased activity tolerance, Decreased balance, Decreased safety awareness, Difficulty walking, Decreased strength, Impaired UE functional use  Visit Diagnosis: Other abnormalities of gait and mobility  Muscle weakness (generalized)     Problem List Patient Active Problem List   Diagnosis Date Noted  . Left hemiparesis ([place]) 03/28/2020  . Chronic bilateral low back pain without sciatica 02/25/2020  . Spasticity as late effect of cerebrovascular accident (CVA) 02/25/2020  . Cognitive and neurobehavioral dysfunction   . Hypertensive urgency 01/28/2020  . Urinary  retention 01/28/2020  . Right middle cerebral artery stroke ([place]) 01/28/2020  . Stroke (cerebrum) ([place]) -  [name] 01/25/2020  . Acute respiratory failure with hypoxia ([place])   . Hypotension   . Hypokalemia   . Hypocalcemia   . Encephalopathy acute   . Stroke ([place]) 01/24/2020  . Middle cerebral artery embolism, right 01/24/2020  . Hyperlipidemia 02/21/2019  . Impingement syndrome of right shoulder region 02/21/2018  . OA (osteoarthritis) of hip 01/18/2018  . Prostate cancer ([place]) 03/04/2016  . Essential hypertension 11/27/2014  . History of colonic polyps 09/26/2014  . BACK PAIN, LEFT 12/22/2009  . PROSTATE SPECIFIC ANTIGEN, ELEVATED 12/20/2008  . LATERAL EPICONDYLITIS  07/15/2008  . ACTINIC KERATOSIS, FOREHEAD, LEFT 05/23/2008  . WART, LEFT HAND 07/19/2007  . ERECTILE DYSFUNCTION, MILD 07/19/2007  . HERNIA, BILATERAL INGUINAL W/O OBST/GANGRENE 07/19/2007  . MENISCUS TEAR 07/19/2007  . Bilateral inguinal hernia 07/19/2007    Electa Sniff, PT, DPT, NCS 04/17/2020, 7:00 PM  [facility] [address] [place], [place], [address] Phone: [phone]   Fax:  [phone]  Name: Ruben Chavez MRN: [id number] Date of Birth: 12-29-1950

## 2020-04-17 NOTE — Therapy (Signed)
[facility] [address] [place], [place], [address] Phone: [phone]   Fax:  [phone]  Speech Language Pathology Treatment and Progress Note Patient Details  Name: Ruben Chavez MRN: [id number] Date of Birth: 1951/10/27 Referring Provider (SLP): Lauraine Rinne, PA-C   Encounter Date: 04/17/2020  End of Session - 04/17/20 1831    Visit Number  10    Number of Visits  17    Date for SLP Re-Evaluation  04/19/20    Authorization Type  one copay for multiple disciplines if same day    SLP Start Time  1450    SLP Stop Time   1530    SLP Time Calculation (min)  40 min    Activity Tolerance  Patient tolerated treatment well       Past Medical History:  Diagnosis Date  . Elevated PSA   . Hyperlipidemia    pt says not anymore  . Hypertension   . Prostate cancer ([place]) 2013    Past Surgical History:  Procedure Laterality Date  . bilateral inguinal hernia  2008  . BUBBLE STUDY  01/28/2020   Procedure: BUBBLE STUDY;  Surgeon: [name], MD;  Location: [facility] ENDOSCOPY;  Service: Cardiovascular;;  . CYST REMOVAL TRUNK  2016   sebaceous cyst  . CYSTOSCOPY  04/22/2016   Procedure: CYSTOSCOPY;  Surgeon: [name], MD;  Location: [facility];  Service: Urology;;  . IR ANGIO VERTEBRAL SEL SUBCLAVIAN INNOMINATE UNI R MOD SED  01/25/2020  . IR CT HEAD LTD  01/25/2020  . IR PERCUTANEOUS ART THROMBECTOMY/INFUSION INTRACRANIAL INC DIAG ANGIO  01/25/2020  . KNEE ARTHROSCOPY  2007   right  . LOOP RECORDER INSERTION N/A 01/28/2020   Procedure: LOOP RECORDER INSERTION;  Surgeon: [name], MD;  Location: [facility] CV LAB;  Service: Cardiovascular;  Laterality: N/A;  . PROSTATE BIOPSY  2008  . PROSTATE BIOPSY  2013  . PROSTATE BIOPSY  01/28/2016  . RADIOACTIVE SEED IMPLANT N/A 04/22/2016   Procedure: RADIOACTIVE SEED IMPLANT/BRACHYTHERAPY IMPLANT;  Surgeon: [name], MD;  Location: [facility];  Service: Urology;  Laterality: N/A;  . RADIOLOGY WITH ANESTHESIA N/A 01/24/2020   Procedure: IR WITH ANESTHESIA;  Surgeon: [name], MD;  Location: [place];  Service: Radiology;  Laterality: N/A;  . TEE WITHOUT CARDIOVERSION N/A 01/28/2020   Procedure: TRANSESOPHAGEAL ECHOCARDIOGRAM (TEE);  Surgeon: [name], MD;  Location: [facility] ENDOSCOPY;  Service: Cardiovascular;  Laterality: N/A;  . TONSILLECTOMY    . TOTAL HIP ARTHROPLASTY  2001   left  . TOTAL HIP ARTHROPLASTY Right 01/18/2018   Procedure: RIGHT TOTAL HIP ARTHROPLASTY ANTERIOR APPROACH;  Surgeon: [name], MD;  Location: [facility];  Service: Orthopedics;  Laterality: Right;    There were no vitals filed for this visit.  Subjective Assessment - 04/17/20 1450    Subjective  "I saw Dr. Estanislado Pandy yesterday."    Currently in Pain?  No/denies            ADULT SLP TREATMENT - 04/17/20 1451      General Information   Behavior/Cognition  Alert;Cooperative;Pleasant mood;Impulsive;Requires cueing      Treatment Provided   Treatment provided  Cognitive-Linquistic      Pain Assessment   Pain Assessment  No/denies pain      Cognitive-Linquistic Treatment   Treatment focused on  Cognition    Skilled Treatment  Patient retrieved homework from notebook independently. Pt admitted he realized a mistake when  he got to the end, went back to correct this: "This was hard." On another worksheet when SLP pointed out 4 omissions due to attention patient initially explaining away deficits, until SLP confronted pt with an example that his explanation was incorrect. Patient opened up to SLP and acknowledged that he needs to consider that he might be incorrect sometimes. He told SLP about blaming his wife for losing a refrigerator manual but then found it in his desk. He also mentioned he is frustrated by losing items or forgetting to do things such as toast/eat an English muffin after sitting down to eat his cereal.  Pt generated solution of taking the english muffin to the table with him so he remembers to toast it after he eats his cereal. Attention/awareness/insight remains relatively unchanged in cognitive tasks today. When looking at calendar and upcoming appointments, pt first disagreed with SLP about upcoming appointment times until he re-read and saw he wrote "cancelled". Pt deleted canceled appointments instead at SLP suggestion.      Assessment / Recommendations / Plan   Plan  Continue with current plan of care      Progression Toward Goals   Progression toward goals  Not progressing toward goals (comment)   awareness/insight limit progress; same level of cuing        SLP Short Term Goals - 04/17/20 1509      SLP SHORT TERM GOAL #1   Title  Patient will use compensatory aids/strategies (electronic or paper) with occasional cues from spouse to be on time for appointments x3 sessions    Baseline  03-24-20, 03-26-20    Status  Partially Met      SLP SHORT TERM GOAL #2   Title  Patient will complete standardized cognitive testing within the first 3 sessions.    Status  Achieved      SLP SHORT TERM GOAL #3   Title  Patient will demonstrate appropriate topic maintenance over 5 minute conversation with occasional min cues x2 sessions.    Status  Not Met      SLP SHORT TERM GOAL #4   Title  pt will demo selective attention adequate to complete a 5-minute therapy task in 3 sessions    Baseline  03-24-20    Status  Partially Met      SLP SHORT TERM GOAL #5   Title  pt will demo functional organization in a therapy task, with occasional mod A    Status  Not Met       SLP Long Term Goals - 04/17/20 1509      SLP LONG TERM GOAL #1   Title  Patient will use compensatory aids/strategies (electronic or paper) to manage schedule, appointments, and daily tasks with rare min A x 4 sessions.    Baseline  03-31-20    Time  2    Period  Weeks    Status  On-going      SLP LONG TERM GOAL #2   Title   Pt will ID and correct errors on financial documents with occasional min A over 3 sessions    Time  2    Period  Weeks    Status  On-going      SLP LONG TERM GOAL #3   Title  Patient will demonstrate appropriate topic maintenance over 6 minute conversation with modified independence x 3 sessions.    Time  2    Period  Weeks    Status  On-going  SLP LONG TERM GOAL #4   Title  pt will demo selective attention adequate for completing a simple to mod-complex 10-minute task, over three sessions    Time  2    Period  Weeks    Status  On-going       Plan - 04/17/20 1831    Clinical Impression Statement  Ruben Chavez presents with, functionally, moderate cognitive communication deficits. Pt demonstrated reduced awareness/insight into his deficits and poor frustration tolerance today in session, which are relatively unchanged over past several sessions. See "skilled intervention" for more details. Will continue ST at 1x per week; expect d/c in next 1-2 sessions unless improvements in awareness/insight noted. I recommend cont'd skilled ST to address cognitive communication impairment to maximize safety and independence at home.    Speech Therapy Frequency  1x /week    Duration  --   8 weeks or 17 visits   Treatment/Interventions  Environmental controls;SLP instruction and feedback;Cueing hierarchy;Compensatory techniques;Cognitive reorganization;Functional tasks;Compensatory strategies;Internal/external aids;Multimodal communcation approach;Patient/family education    Potential to Achieve Goals  Good    Consulted and Agree with Plan of Care  Patient;Family member/caregiver       Patient will benefit from skilled therapeutic intervention in order to improve the following deficits and impairments:   Cognitive communication deficit    Problem List Patient Active Problem List   Diagnosis Date Noted  . Left hemiparesis ([place]) 03/28/2020  . Chronic bilateral low back pain without  sciatica 02/25/2020  . Spasticity as late effect of cerebrovascular accident (CVA) 02/25/2020  . Cognitive and neurobehavioral dysfunction   . Hypertensive urgency 01/28/2020  . Urinary retention 01/28/2020  . 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 Stroke (cerebrum) ([place]) -  [name] 01/25/2020  . Acute respiratory failure with hypoxia ([place])   . Hypotension   . Hypokalemia   . Hypocalcemia   . Encephalopathy acute   . Stroke ([place]) 01/24/2020  . Middle cerebral artery embolism, right 01/24/2020  . Hyperlipidemia 02/21/2019  . Impingement syndrome of right shoulder region 02/21/2018  . OA (osteoarthritis) of hip 01/18/2018  . Prostate cancer ([place]) 03/04/2016  . Essential hypertension 11/27/2014  . History of colonic polyps 09/26/2014  . BACK PAIN, LEFT 12/22/2009  . PROSTATE SPECIFIC ANTIGEN, ELEVATED 12/20/2008  . LATERAL EPICONDYLITIS 07/15/2008  . ACTINIC KERATOSIS, FOREHEAD, LEFT 05/23/2008  . WART, LEFT HAND 07/19/2007  . ERECTILE DYSFUNCTION, MILD 07/19/2007  . HERNIA, BILATERAL INGUINAL W/O OBST/GANGRENE 07/19/2007  . MENISCUS TEAR 07/19/2007  . Bilateral inguinal hernia 07/19/2007   Speech Therapy Progress Note  Dates of Reporting Period: 02/19/20 to 04/17/20  Patient has been seen for 10 speech therapy sessions addressing cognitive communication deficits. Same level of cuing necessary in cognitive tasks and for compensations due to impaired awareness/insight in recent session. See goals and clinical impressions above. Anticipate d/c in next 1-2 sessions.   Ruben Chavez, Ruben Chavez, Ruben Chavez 04/17/2020, 6:34 PM  [facility] [address] [place], [place], [address] Phone: [phone]   Fax:  [phone]   Name: Ruben Chavez MRN:  [id number] Date of Birth: [birth year]

## 2020-04-17 NOTE — Patient Instructions (Signed)
Access Code: [email or web address] URL: [email or web address] Date: 04/17/2020 Prepared by: Cherly Anderson  Exercises Romberg Stance Eyes Closed on Foam Pad - 1 x daily - 5 x weekly - 3 sets - 30 hold Standing Balance - Eyes Closed with Head Motions - 2 x daily - 5 x weekly - 2 sets - 10 reps Clamshell with Resistance - 1-2 x daily - 5 x weekly - 2 sets - 12 reps Forward Step Up - 1 x daily - 5 x weekly - 2 sets - 10 reps Lateral Step Up with Unilateral Counter Support - 1 x daily - 5 x weekly - 2 sets - 10 reps Standing Toe Taps - 1 x daily - 7 x weekly - 2 sets - 10 reps Single Leg Stance - 2 x daily - 7 x weekly - 1 sets - 4 reps - 10 sec hold

## 2020-04-17 NOTE — Therapy (Signed)
[facility] [address] [place], [place], [address] Phone: [phone]   Fax:  [phone]  Occupational Therapy Treatment  Patient Details  Name: Ruben Chavez MRN: [id number] Date of Birth: Dec 25, 1950 Referring Provider (OT): Lauraine Rinne, PA-C   Encounter Date: 04/17/2020  OT End of Session - 04/17/20 1514    Visit Number  15    Number of Visits  17    Date for OT Re-Evaluation  04/22/20    Authorization Type  [employer] (follow Medicare)--1 copay for multiple visits    Authorization Time Period  cert date 1/51/76-1/60/73    Authorization - Visit Number  15    Authorization - Number of Visits  20    Progress Note Due on Visit  20    OT Start Time  1402    OT Stop Time  1447    OT Time Calculation (min)  45 min    Activity Tolerance  Patient tolerated treatment well    Behavior During Therapy  [facility] for tasks assessed/performed       Past Medical History:  Diagnosis Date  . Elevated PSA   . Hyperlipidemia    pt says not anymore  . Hypertension   . Prostate cancer ([place]) 2013    Past Surgical History:  Procedure Laterality Date  . bilateral inguinal hernia  2008  . BUBBLE STUDY  01/28/2020   Procedure: BUBBLE STUDY;  Surgeon: [name], MD;  Location: [facility] ENDOSCOPY;  Service: Cardiovascular;;  . CYST REMOVAL TRUNK  2016   sebaceous cyst  . CYSTOSCOPY  04/22/2016   Procedure: CYSTOSCOPY;  Surgeon: [name], MD;  Location: [facility];  Service: Urology;;  . IR ANGIO VERTEBRAL SEL SUBCLAVIAN INNOMINATE UNI R MOD SED  01/25/2020  . IR CT HEAD LTD  01/25/2020  . IR PERCUTANEOUS ART THROMBECTOMY/INFUSION INTRACRANIAL INC DIAG ANGIO  01/25/2020  . KNEE ARTHROSCOPY  2007   right  . LOOP RECORDER INSERTION N/A 01/28/2020   Procedure: LOOP RECORDER INSERTION;  Surgeon: [name], MD;  Location: [facility] CV LAB;  Service: Cardiovascular;  Laterality: N/A;  . PROSTATE BIOPSY   2008  . PROSTATE BIOPSY  2013  . PROSTATE BIOPSY  01/28/2016  . RADIOACTIVE SEED IMPLANT N/A 04/22/2016   Procedure: RADIOACTIVE SEED IMPLANT/BRACHYTHERAPY IMPLANT;  Surgeon: [name], MD;  Location: [facility];  Service: Urology;  Laterality: N/A;  . RADIOLOGY WITH ANESTHESIA N/A 01/24/2020   Procedure: IR WITH ANESTHESIA;  Surgeon: [name], MD;  Location: [facility];  Service: Radiology;  Laterality: N/A;  . TEE WITHOUT CARDIOVERSION N/A 01/28/2020   Procedure: TRANSESOPHAGEAL ECHOCARDIOGRAM (TEE);  Surgeon: [name], MD;  Location: [facility] ENDOSCOPY;  Service: Cardiovascular;  Laterality: N/A;  . TONSILLECTOMY    . TOTAL HIP ARTHROPLASTY  2001   left  . TOTAL HIP ARTHROPLASTY Right 01/18/2018   Procedure: RIGHT TOTAL HIP ARTHROPLASTY ANTERIOR APPROACH;  Surgeon: [name], MD;  Location: [facility];  Service: Orthopedics;  Laterality: Right;    There were no vitals filed for this visit.  Subjective Assessment - 04/17/20 1407    Pertinent History  PMH:  hyperlipidemia, hypertension, prostate cancer radioactive seed implant 2017, R total hip arthroplasty, loop recorder insertion    Limitations  loop recorder    Patient Stated Goals  get my L hand and fingers working    Currently in Pain?  No/denies       Worked on wrist extension against  gravity - pt now able to do fully w/ finger flexion. Began working on finger extension with wrist extension - pt limited but emerging. Also attempted place and hold ex's for full composite wrist and finger extension - pt unable to hold fully but could lower slowly w/ more control.  Pt shown card activity and towel scrunches for isolated finger flex/ext w/ wrist extension - pt requires wrist support to prevent compensations. Pt had difficulty w/ this especially with lifting fingers off towel/cards enough to return to starting position. Pt required mod cueing to perform correctly.  Also worked on controlled forearm rotation  with elbow bent but unsupported (wrist brace on for this) - pt compensates into forearm supination with elbow flexion and cannot go into pronation unless forearm supported. However as pt extends arm or goes into higher sh flexion, pt goes into forearm pronation.  Also worked on squeezing small (deflated) ball and then releasing as able.                       OT Short Term Goals - 04/10/20 1609      OT SHORT TERM GOAL #1   Title  Pt will be independent with initial HEP--check STG 03/24/20    Time  4    Period  Weeks    Status  Achieved      OT SHORT TERM GOAL #2   Title  Pt will verbalize understanding of proper positioning of LUE to prevent pain/injury and edema management techniques.    Time  4    Period  Weeks    Status  Achieved      OT SHORT TERM GOAL #3   Title  Pt will be independent with splint wear/care for improved positioning.    Time  4    Period  Weeks    Status  Achieved      OT SHORT TERM GOAL #4   Title  Pt will be able to use LUE as a stabilizer for simple ADL tasks.    Time  4    Period  Weeks    Status  On-going      OT SHORT TERM GOAL #5   Title  Pt will demo at least 25% gross finger flex in prep for functional grasp.    Baseline  4    Time  4    Period  Weeks    Status  Achieved   except index and thumb       OT Long Term Goals - 03/14/20 1209      OT LONG TERM GOAL #1   Title  Pt will be independent with updated HEP.--check LTGs 04/23/20    Time  8    Period  Weeks    Status  New      OT LONG TERM GOAL #2   Title  Pt will be able to use LUE as nondominant assist for ADLs at least 50% of the time.    Time  8    Period  Weeks    Status  New      OT LONG TERM GOAL #3   Title  Pt will be able to score at least 10 on box and blocks test with LUE for improved coordination for ADLs.    Time  8    Period  Weeks    Status  New      OT LONG TERM GOAL #4   Title  Pt will complete clothing  fasteners mod I using AE prn.    Time   8    Period  Weeks    Status  New      OT LONG TERM GOAL #5   Title  Pt will demo improved attention/visual scanning to be able to perform environmental scanning in busy enivornment with at least 95% accuracy for incr safety for community activities/driving    Baseline  03/14/20;  58% initially    Time  8    Period  Weeks    Status  New      OT LONG TERM GOAL #6   Title  Pt will demo at least 75% gross finger flex/ext for grasp/release of objects.    Time  8    Period  Weeks    Status  New            Plan - 04/17/20 1515    Clinical Impression Statement  Pt compensates in supination w/ elbow flexion and cannot go into pronation w/ elbow flexed unless supported. Pt however goes into pronation with elbow extended.    Occupational performance deficits (Please refer to evaluation for details):  ADL's;IADL's;Leisure;Social Participation;Work    Mining engineer  Attention;Memory;Perception;Safety Awareness    OT Frequency  2x / week    OT Duration  8 weeks    OT Treatment/Interventions  Self-care/ADL training;Moist Heat;Fluidtherapy;DME and/or AE instruction;Splinting;Therapeutic activities;Contrast Bath;Aquatic Therapy;Ultrasound;Therapeutic exercise;Cognitive remediation/compensation;Visual/perceptual remediation/compensation;Passive range of motion;Functional Mobility Training;Neuromuscular education;Cryotherapy;Electrical Stimulation;Paraffin;Energy conservation;Manual Therapy;Patient/family education    Plan  renewal next week, issue HEP for: wrist ext against gravity, place & hold in wrist & finger ext, card activity for wrist and finger ext, and work on pronation with elbow flex    Consulted and Agree with Plan of Care  Patient       Patient will benefit from skilled therapeutic intervention in order to improve the following deficits and impairments:     Cognitive Skills: Attention, Memory, Perception, Safety Awareness     Visit Diagnosis: Hemiplegia and hemiparesis  following cerebral infarction affecting left non-dominant side (HCC)  Other lack of coordination  Other symptoms and signs involving the nervous system    Problem List Patient Active Problem List   Diagnosis Date Noted  . Left hemiparesis ([place]) 03/28/2020  . Chronic bilateral low back pain without sciatica 02/25/2020  . Spasticity as late effect of cerebrovascular accident (CVA) 02/25/2020  . Cognitive and neurobehavioral dysfunction   . Hypertensive urgency 01/28/2020  . Urinary retention 01/28/2020  . Right middle cerebral artery stroke ([place]) 01/28/2020  . Stroke (cerebrum) ([place]) -  [name] 01/25/2020  . Acute respiratory failure with hypoxia ([place])   . Hypotension   . Hypokalemia   . Hypocalcemia   . Encephalopathy acute   . Stroke ([place]) 01/24/2020  . Middle cerebral artery embolism, right 01/24/2020  . Hyperlipidemia 02/21/2019  . Impingement syndrome of right shoulder region 02/21/2018  . OA (osteoarthritis) of hip 01/18/2018  . Prostate cancer ([place]) 03/04/2016  . Essential hypertension 11/27/2014  . History of colonic polyps 09/26/2014  . BACK PAIN, LEFT 12/22/2009  . PROSTATE SPECIFIC ANTIGEN, ELEVATED 12/20/2008  . LATERAL EPICONDYLITIS 07/15/2008  . ACTINIC KERATOSIS, FOREHEAD, LEFT 05/23/2008  . WART, LEFT HAND 07/19/2007  . ERECTILE DYSFUNCTION, MILD 07/19/2007  . HERNIA, BILATERAL INGUINAL W/O OBST/GANGRENE 07/19/2007  . MENISCUS TEAR 07/19/2007  . Bilateral inguinal hernia 07/19/2007    Ruben Chavez,  Barkley Boards, OTR/L 04/17/2020, 3:18 PM  [facility] [address] [place], [place], [address] Phone: [phone]   Fax:  [phone]  Name: Ruben Chavez MRN: [id number] Date of Birth: 08/05/1951

## 2020-04-21 ENCOUNTER — Other Ambulatory Visit (HOSPITAL_COMMUNITY): Payer: Self-pay | Admitting: Interventional Radiology

## 2020-04-21 DIAGNOSIS — I639 Cerebral infarction, unspecified: Secondary | ICD-10-CM

## 2020-04-22 ENCOUNTER — Telehealth: Payer: Self-pay

## 2020-04-22 ENCOUNTER — Encounter: Payer: PPO | Admitting: Speech Pathology

## 2020-04-22 ENCOUNTER — Telehealth: Payer: Self-pay | Admitting: Adult Health

## 2020-04-22 ENCOUNTER — Other Ambulatory Visit: Payer: Self-pay

## 2020-04-22 ENCOUNTER — Ambulatory Visit: Payer: PPO | Admitting: Physical Therapy

## 2020-04-22 ENCOUNTER — Ambulatory Visit: Payer: PPO | Admitting: Occupational Therapy

## 2020-04-22 DIAGNOSIS — I69354 Hemiplegia and hemiparesis following cerebral infarction affecting left non-dominant side: Secondary | ICD-10-CM

## 2020-04-22 DIAGNOSIS — M6281 Muscle weakness (generalized): Secondary | ICD-10-CM

## 2020-04-22 DIAGNOSIS — R29818 Other symptoms and signs involving the nervous system: Secondary | ICD-10-CM

## 2020-04-22 DIAGNOSIS — R2681 Unsteadiness on feet: Secondary | ICD-10-CM

## 2020-04-22 DIAGNOSIS — R2689 Other abnormalities of gait and mobility: Secondary | ICD-10-CM

## 2020-04-22 DIAGNOSIS — R278 Other lack of coordination: Secondary | ICD-10-CM

## 2020-04-22 DIAGNOSIS — R414 Neurologic neglect syndrome: Secondary | ICD-10-CM

## 2020-04-22 DIAGNOSIS — R208 Other disturbances of skin sensation: Secondary | ICD-10-CM

## 2020-04-22 DIAGNOSIS — R41840 Attention and concentration deficit: Secondary | ICD-10-CM

## 2020-04-22 NOTE — Telephone Encounter (Signed)
Pt has called to report that he would like to start driving again as soon as possible.  Pt would like a call back from RN to know that if he request to be seen earlier is it possible he could be released and allowed to resume driving as soon as possible.

## 2020-04-22 NOTE — Therapy (Signed)
[facility] [address] [place], [place], [address] Phone: [phone]   Fax:  [phone]  Occupational Therapy Treatment  Patient Details  Name: Ruben Chavez MRN: [id number] Date of Birth: Oct 10, 1951 Referring Provider (OT): Lauraine Rinne, PA-C   Encounter Date: 04/22/2020  OT End of Session - 04/22/20 1441    Visit Number  16    Number of Visits  17    Date for OT Re-Evaluation  04/22/20    Authorization Type  [employer] (follow Medicare)--1 copay for multiple visits    Authorization Time Period  cert date 3/52/48-1/85/90    Authorization - Visit Number  80    Authorization - Number of Visits  20    Progress Note Due on Visit  20    OT Start Time  1321    OT Stop Time  1420    OT Time Calculation (min)  59 min    Activity Tolerance  Patient tolerated treatment well    Behavior During Therapy  [facility] for tasks assessed/performed       Past Medical History:  Diagnosis Date  . Elevated PSA   . Hyperlipidemia    pt says not anymore  . Hypertension   . Prostate cancer ([place]) 2013    Past Surgical History:  Procedure Laterality Date  . bilateral inguinal hernia  2008  . BUBBLE STUDY  01/28/2020   Procedure: BUBBLE STUDY;  Surgeon: [name], MD;  Location: [facility] ENDOSCOPY;  Service: Cardiovascular;;  . CYST REMOVAL TRUNK  2016   sebaceous cyst  . CYSTOSCOPY  04/22/2016   Procedure: CYSTOSCOPY;  Surgeon: [name], MD;  Location: [facility];  Service: Urology;;  . IR ANGIO VERTEBRAL SEL SUBCLAVIAN INNOMINATE UNI R MOD SED  01/25/2020  . IR CT HEAD LTD  01/25/2020  . IR PERCUTANEOUS ART THROMBECTOMY/INFUSION INTRACRANIAL INC DIAG ANGIO  01/25/2020  . KNEE ARTHROSCOPY  2007   right  . LOOP RECORDER INSERTION N/A 01/28/2020   Procedure: LOOP RECORDER INSERTION;  Surgeon: [name], MD;  Location: [facility] CV LAB;  Service: Cardiovascular;  Laterality: N/A;  . PROSTATE BIOPSY   2008  . PROSTATE BIOPSY  2013  . PROSTATE BIOPSY  01/28/2016  . RADIOACTIVE SEED IMPLANT N/A 04/22/2016   Procedure: RADIOACTIVE SEED IMPLANT/BRACHYTHERAPY IMPLANT;  Surgeon: [name], MD;  Location: [facility];  Service: Urology;  Laterality: N/A;  . RADIOLOGY WITH ANESTHESIA N/A 01/24/2020   Procedure: IR WITH ANESTHESIA;  Surgeon: [name], MD;  Location: [facility];  Service: Radiology;  Laterality: N/A;  . TEE WITHOUT CARDIOVERSION N/A 01/28/2020   Procedure: TRANSESOPHAGEAL ECHOCARDIOGRAM (TEE);  Surgeon: [name], MD;  Location: [facility] ENDOSCOPY;  Service: Cardiovascular;  Laterality: N/A;  . TONSILLECTOMY    . TOTAL HIP ARTHROPLASTY  2001   left  . TOTAL HIP ARTHROPLASTY Right 01/18/2018   Procedure: RIGHT TOTAL HIP ARTHROPLASTY ANTERIOR APPROACH;  Surgeon: [name], MD;  Location: [facility];  Service: Orthopedics;  Laterality: Right;    There were no vitals filed for this visit.  Subjective Assessment - 04/22/20 1323    Subjective   "I think I am ready to drive"    Pertinent History  PMH:  hyperlipidemia, hypertension, prostate cancer radioactive seed implant 2017, R total hip arthroplasty, loop recorder insertion    Limitations  loop recorder    Patient Stated Goals  get my L hand and fingers working    Currently in Pain?  No/denies         AAROM wrist ext, finger flex/ext thumb adduction/abduction and place and holds.  PROM wrist ext stretch.  AROM wrist ext, finger flex/ext.  Removing wooden cylinder pegs from pegboard with min-mod cueing for compensation patterns.    Began checking goals and discussing progress.   Recommended pt continue to wear edema glove with inactivity such as riding in car or if edema increases.  Mod edema noted today which improved significantly after ROM.  (pt arrived without wrist splint or edema glove)   Long discussion regarding driving and OT recommendation that pt is not ready to return to driving due to  cognitive deficits (including decr attention, decr awareness, impulsivity, decr reaction/processing time) and L inattention.  Pt with decr awareness of deficits, decr acknowledgement/understanding of deficits once discussed, pt also perseverates and reports that since he drives with RUE that he feels like he could drive.  Discussed that CVA can affect awareness.  Recommended driving evaluation prior to returning to drive and began discussing process.           OT Education - 04/22/20 1502    Education Details  Updates to HEP:  place and holds, pronation, AAROM--see pt instructions    Person(s) Educated  Patient    Methods  Explanation;Demonstration;Handout;Tactile cues;Verbal cues    Comprehension  Verbal cues required;Verbalized understanding;Returned demonstration;Tactile cues required;Need further instruction       OT Short Term Goals - 04/22/20 1400      OT SHORT TERM GOAL #1   Title  Pt will be independent with initial HEP--check STG 03/24/20    Time  4    Period  Weeks    Status  Achieved      OT SHORT TERM GOAL #2   Title  Pt will verbalize understanding of proper positioning of LUE to prevent pain/injury and edema management techniques.    Time  4    Period  Weeks    Status  Achieved      OT SHORT TERM GOAL #3   Title  Pt will be independent with splint wear/care for improved positioning.    Time  4    Period  Weeks    Status  Achieved      OT SHORT TERM GOAL #4   Title  Pt will be able to use LUE as a stabilizer for simple ADL tasks.    Time  4    Period  Weeks    Status  On-going   04/22/20:  inconsistent     OT SHORT TERM GOAL #5   Title  Pt will demo at least 25% gross finger flex in prep for functional grasp.    Baseline  4    Time  4    Period  Weeks    Status  Achieved   except index and thumb       OT Long Term Goals - 04/22/20 1350      OT LONG TERM GOAL #1   Title  Pt will be independent with updated HEP.--check LTGs 04/23/20    Time  8     Period  Weeks    Status  On-going      OT LONG TERM GOAL #2   Title  Pt will be able to use LUE as nondominant assist for ADLs at least 50% of the time.    Time  8    Period  Weeks    Status  On-going   04/22/20:  25% of the time     OT LONG TERM GOAL #3   Title  Pt will be able to score at least 10 on box and blocks test with LUE for improved coordination for ADLs.    Time  8    Period  Weeks    Status  New      OT LONG TERM GOAL #4   Title  Pt will complete clothing fasteners mod I using AE prn.    Time  8    Period  Weeks    Status  New      OT LONG TERM GOAL #5   Title  Pt will demo improved attention/visual scanning to be able to perform environmental scanning in busy enivornment with at least 95% accuracy for incr safety for community activities/driving    Baseline  03/14/20;  58% initially    Time  8    Period  Weeks    Status  New      OT LONG TERM GOAL #6   Title  Pt will demo at least 75% gross finger flex/ext for grasp/release of objects.    Time  8    Period  Weeks    Status  On-going   04/22/20:  approx 75% extension approx 50% flexion (performance inconsistent at times)           Plan - 04/22/20 1353    Clinical Impression Statement  Pt continues to demo decr awareness of cognitive deficits.  Pt continues to need repetition of directions and incr cueing/repetition for understanding.  Pt perseverates and is easily frustrated with discussion of cognitive deficits, which would affect driving.    Recommended specific objective driving evaluation due to decr awareness and pt was agreeable to persue (will issue info and discuss further next session).  Pt is making progress with LUE functional use and distal LUE movement, but continues to need cueing for positioning and performance of therapy activities/HEP.    Occupational performance deficits (Please refer to evaluation for details):  ADL's;IADL's;Leisure;Social Participation;Work    Mining engineer   Attention;Memory;Perception;Safety Awareness    OT Frequency  2x / week    OT Duration  8 weeks    OT Treatment/Interventions  Self-care/ADL training;Moist Heat;Fluidtherapy;DME and/or AE instruction;Splinting;Therapeutic activities;Contrast Bath;Aquatic Therapy;Ultrasound;Therapeutic exercise;Cognitive remediation/compensation;Visual/perceptual remediation/compensation;Passive range of motion;Functional Mobility Training;Neuromuscular education;Cryotherapy;Electrical Stimulation;Paraffin;Energy conservation;Manual Therapy;Patient/family education    Plan  check remaining goals and complete renewal;  issue/further discuss driving evaluation recommendation    Consulted and Agree with Plan of Care  Patient       Patient will benefit from skilled therapeutic intervention in order to improve the following deficits and impairments:     Cognitive Skills: Attention, Memory, Perception, Safety Awareness     Visit Diagnosis: Hemiplegia and hemiparesis following cerebral infarction affecting left non-dominant side (HCC)  Other lack of coordination  Other symptoms and signs involving the nervous system  Other abnormalities of gait and mobility  Muscle weakness (generalized)  Unsteadiness on feet  Attention and concentration deficit  Other disturbances of skin sensation  Neurologic neglect syndrome    Problem List Patient Active Problem List   Diagnosis Date Noted  . Left hemiparesis ([place]) 03/28/2020  . Chronic bilateral low back pain without sciatica 02/25/2020  . Spasticity as late effect of cerebrovascular accident (CVA) 02/25/2020  . Cognitive and neurobehavioral dysfunction   . Hypertensive urgency 01/28/2020  . Urinary retention 01/28/2020  . Right middle cerebral artery stroke ([place]) 01/28/2020  . Stroke (cerebrum) ([place]) -  [name] 01/25/2020  . Acute respiratory  failure with hypoxia ([place])   . Hypotension   . Hypokalemia   . Hypocalcemia   . Encephalopathy acute   . Stroke ([place]) 01/24/2020  . Middle cerebral artery embolism, right 01/24/2020  . Hyperlipidemia 02/21/2019  . Impingement syndrome of right shoulder region 02/21/2018  . OA (osteoarthritis) of hip 01/18/2018  . Prostate cancer ([place]) 03/04/2016  . Essential hypertension 11/27/2014  . History of colonic polyps 09/26/2014  . BACK PAIN, LEFT 12/22/2009  . PROSTATE SPECIFIC ANTIGEN, ELEVATED 12/20/2008  . LATERAL EPICONDYLITIS 07/15/2008  . ACTINIC KERATOSIS, FOREHEAD, LEFT 05/23/2008  . WART, LEFT HAND 07/19/2007  . ERECTILE DYSFUNCTION, MILD 07/19/2007  . HERNIA, BILATERAL INGUINAL W/O OBST/GANGRENE 07/19/2007  . MENISCUS TEAR 07/19/2007  . Bilateral inguinal hernia 07/19/2007    [facility] 04/22/2020, 3:04 PM  [facility] [address] [place], [place], [address] Phone: [phone]   Fax:  [phone]  Name: KAEL KEETCH MRN: [id number] Date of Birth: [birth year]   Vianne Bulls, OTR/L [facility] [address] [place], [place]  [address] [phone] phone [phone] 04/22/20 3:04 PM

## 2020-04-22 NOTE — Therapy (Signed)
[facility] [address] [place], [place], [address] Phone: [phone]   Fax:  [phone]  Physical Therapy Treatment/ Discharge Summary  Patient Details  Name: Ruben Chavez MRN: [id number] Date of Birth: [birth year] Referring Provider (PT): Lauraine Rinne, PA-C (pt will be following up with Dr. Dagoberto Ligas)   Encounter Date: 04/22/2020  PT End of Session - 04/22/20 1524    Visit Number  12    Number of Visits  17    Date for PT Re-Evaluation  05/15/20   written for 60 day POC   Authorization Type  [employer]    PT Start Time  1440    PT Stop Time  1520    PT Time Calculation (min)  40 min    Equipment Utilized During Treatment  Gait belt    Activity Tolerance  Patient tolerated treatment well    Behavior During Therapy  [facility] for tasks assessed/performed       Past Medical History:  Diagnosis Date  . Elevated PSA   . Hyperlipidemia    pt says not anymore  . Hypertension   . Prostate cancer ([place]) 2013    Past Surgical History:  Procedure Laterality Date  . bilateral inguinal hernia  2008  . BUBBLE STUDY  01/28/2020   Procedure: BUBBLE STUDY;  Surgeon: [name], MD;  Location: [facility] ENDOSCOPY;  Service: Cardiovascular;;  . CYST REMOVAL TRUNK  2016   sebaceous cyst  . CYSTOSCOPY  04/22/2016   Procedure: CYSTOSCOPY;  Surgeon: [name], MD;  Location: [facility];  Service: Urology;;  . IR ANGIO VERTEBRAL SEL SUBCLAVIAN INNOMINATE UNI R MOD SED  01/25/2020  . IR CT HEAD LTD  01/25/2020  . IR PERCUTANEOUS ART THROMBECTOMY/INFUSION INTRACRANIAL INC DIAG ANGIO  01/25/2020  . KNEE ARTHROSCOPY  2007   right  . LOOP RECORDER INSERTION N/A 01/28/2020   Procedure: LOOP RECORDER INSERTION;  Surgeon: [name], MD;  Location: [facility] CV LAB;  Service: Cardiovascular;  Laterality: N/A;  . PROSTATE BIOPSY  2008  . PROSTATE BIOPSY  2013  . PROSTATE BIOPSY  01/28/2016  .  RADIOACTIVE SEED IMPLANT N/A 04/22/2016   Procedure: RADIOACTIVE SEED IMPLANT/BRACHYTHERAPY IMPLANT;  Surgeon: [name], MD;  Location: [facility];  Service: Urology;  Laterality: N/A;  . RADIOLOGY WITH ANESTHESIA N/A 01/24/2020   Procedure: IR WITH ANESTHESIA;  Surgeon: [name], MD;  Location: [facility];  Service: Radiology;  Laterality: N/A;  . TEE WITHOUT CARDIOVERSION N/A 01/28/2020   Procedure: TRANSESOPHAGEAL ECHOCARDIOGRAM (TEE);  Surgeon: [name], MD;  Location: [facility] ENDOSCOPY;  Service: Cardiovascular;  Laterality: N/A;  . TONSILLECTOMY    . TOTAL HIP ARTHROPLASTY  2001   left  . TOTAL HIP ARTHROPLASTY Right 01/18/2018   Procedure: RIGHT TOTAL HIP ARTHROPLASTY ANTERIOR APPROACH;  Surgeon: [name], MD;  Location: [facility] ORS;  Service: Orthopedics;  Laterality: Right;    There were no vitals filed for this visit.  Subjective Assessment - 04/22/20 1442    Subjective  Doing well.    Pertinent History  hyperlipidemia, hypertension, prostate cancer with radioactive seed implant, remote tobacco abuse.    Diagnostic tests  Cranial CT scan showed subacute infarction right lateral temporal lobe    Patient Stated Goals  wants to get left hand and arm working, wants to improve his balance and changing direction (per wife).    Currently in Pain?  No/denies  Access Code: [email or web address] URL: [email or web address] Date: 04/22/2020 Prepared by: Janann August  Reviewed pt's finalized HEP   Exercises Romberg Stance Eyes Closed on Foam Pad - 1 x daily - 5 x weekly - 3 sets - 30 hold Standing Balance - Eyes Closed with Head Motions - 2 x daily - 5 x weekly - 2 sets - 10 reps -progressed to more narrow BOS with head motions  Clamshell with Resistance - 1-2 x daily - 5 x weekly - 3 sets - 12 reps - upgraded to blue theraband  Forward Step Up - 1 x daily - 5 x weekly - 2 sets - 10 reps Lateral Step Up with Unilateral  Counter Support - 1 x daily - 5 x weekly - 2 sets - 10 reps Standing Toe Taps - 1 x daily - 7 x weekly - 2 sets - 10 reps - standing on single pillow next to counter top  Single Leg Stance - 2 x daily - 7 x weekly - 1 sets - 4 reps - 10 sec hold     [facility] Adult PT Treatment/Exercise - 04/22/20 0001      Neuro Re-ed    Neuro Re-ed Details   Standing in // bars: tapping L foot onto more compliant side of dyna disc and then R toe taps to 3 cones - cues for slowed and controlled with min guard and intermittent UE support for balance with RUE. On rockerboard in A/P direction - eyes closed, with multiple bouts of 10-15 seconds each, with min A at times for balance due to pt losing balance posteriorly.                PT Short Term Goals - 04/15/20 1455      PT SHORT TERM GOAL #1   Title  Pt will be independent with initial HEP in order to build upon functional gains made in therapy. ALL STGS DUE 04/15/20    Time  4   due to delay in scheduling - 1st return visit 03/18/20   Period  Weeks    Status  Achieved    Target Date  04/15/20      PT SHORT TERM GOAL #2   Title  Pt will improve FGA score to at least a 27/30 in order to indicate decr fall risk.    Baseline  28/30 on 04/08/20    Time  4    Period  Weeks    Status  Achieved      PT SHORT TERM GOAL #3   Title  Pt will ambulate at least 300' over level surfaces while scanning environment with supervision and no LOB.    Time  4    Period  Weeks    Status  Achieved      PT SHORT TERM GOAL #4   Title  Pt will decr 5x sit <> stand time to 14 seconds or less in order to indicate improved functional LE strength.    Baseline  16.69 seconds on 02/15/20, 11.31 seconds on 04/15/20    Time  4    Period  Weeks    Status  Achieved      PT SHORT TERM GOAL #5   Title  Pt will improve gait speed to at least 2.9 ft/sec in order to demo improved gait efficiency.    Baseline  3.41 ft/sec on 04/15/20    Time  4    Period  Weeks    Status   Achieved  PT Long Term Goals - 04/22/20 1525      PT LONG TERM GOAL #1   Title  Pt will be independent with final HEP in order to build upon functional gains made in therapy. ALL LTGS DUE 05/13/20    Baseline  reviewed pt's final HEP on 04/22/20    Time  8   due to delay in scheduling   Period  Weeks    Status  Achieved      PT LONG TERM GOAL #2   Title  Pt will improve FGA score to at least a 29/30 in order to indicate decr fall risk.    Baseline  28/30    Time  8    Period  Weeks    Status  Not Met      PT LONG TERM GOAL #3   Title  30 second chair stand goal to be written as appropriate to demo improved LE strength and endurance.    Baseline  met - performing 15 sit <> stands, in normal age range for age 66, no LTG appropriate.    Time  8    Period  Weeks    Status  Achieved      PT LONG TERM GOAL #4   Title  Pt will improve gait speed to at least 3.4 ft/sec in order to demo improved gait efficiency in the community.    Baseline  3.41 ft/sec on 04/15/20    Time  8    Period  Weeks    Status  Achieved      PT LONG TERM GOAL #5   Title  Pt will ambulate at least 500' over unlevel surfaces with mod I and scanning environment in order to improve community mobility.    Baseline  supervision    Time  8    Period  Weeks    Status  Partially Met      PT LONG TERM GOAL #6   Title  Pt will perform 12 steps with no handrail and step through pattern with supervision in order to indicate improved functional LE strength.    Time  8    Period  Weeks    Status  Achieved       PHYSICAL THERAPY DISCHARGE SUMMARY  Visits from Start of Care: 12  Current functional level related to goals / functional outcomes: See LTGs   Remaining deficits: Impaired SLS on compliant surfaces   Education / Equipment: HEP  Plan: Patient agrees to discharge.  Patient goals were met. Patient is being discharged due to meeting the stated rehab goals.  ?????           Plan -  04/22/20 1528    Clinical Impression Statement  Focus of today's skilled session was finalizing and reviewing pt's HEP for discharge. Pt is in agreement for discharge. Remainder of session focused on balance strategies with SLS and eyes closed on compliant surfaces with min A at times for balance with eyes closed on rockerboard.    Personal Factors and Comorbidities  Comorbidity 3+;Past/Current Experience    Comorbidities  hyperlipidemia, hypertension, prostate cancer with radioactive seed implant, remote tobacco abuse.    Examination-Activity Limitations  Stairs;Locomotion Level    Examination-Participation Restrictions  Community Activity;Driving;[employer] Work   being on his boat   Stability/Clinical Decision Making  Stable/Uncomplicated    Rehab Potential  Good    PT Frequency  2x / week    PT Duration  8 weeks    PT  Treatment/Interventions  ADLs/Self Care Home Management;Therapeutic activities;Functional mobility training;Stair training;Gait training;Therapeutic exercise;Balance training;Neuromuscular re-education;Patient/family education    PT Next Visit Plan  D/C    PT Home Exercise Plan  [id number]    Consulted and Agree with Plan of Care  Patient       Patient will benefit from skilled therapeutic intervention in order to improve the following deficits and impairments:  Abnormal gait, Decreased activity tolerance, Decreased balance, Decreased safety awareness, Difficulty walking, Decreased strength, Impaired UE functional use  Visit Diagnosis: Other lack of coordination  Other symptoms and signs involving the nervous system  Other abnormalities of gait and mobility  Muscle weakness (generalized)  Unsteadiness on feet     Problem List Patient Active Problem List   Diagnosis Date Noted  . Left hemiparesis ([place]) 03/28/2020  . Chronic bilateral low back pain without sciatica 02/25/2020  . Spasticity as late effect of cerebrovascular accident (CVA) 02/25/2020  . Cognitive and  neurobehavioral dysfunction   . Hypertensive urgency 01/28/2020  . Urinary retention 01/28/2020  . Right middle cerebral artery stroke ([place]) 01/28/2020  . Stroke (cerebrum) ([place]) -  [name] 01/25/2020  . Acute respiratory failure with hypoxia ([place])   . Hypotension   . Hypokalemia   . Hypocalcemia   . Encephalopathy acute   . Stroke ([place]) 01/24/2020  . Middle cerebral artery embolism, right 01/24/2020  . Hyperlipidemia 02/21/2019  . Impingement syndrome of right shoulder region 02/21/2018  . OA (osteoarthritis) of hip 01/18/2018  . Prostate cancer ([place]) 03/04/2016  . Essential hypertension 11/27/2014  . History of colonic polyps 09/26/2014  . BACK PAIN, LEFT 12/22/2009  . PROSTATE SPECIFIC ANTIGEN, ELEVATED 12/20/2008  . LATERAL EPICONDYLITIS 07/15/2008  . ACTINIC KERATOSIS, FOREHEAD, LEFT 05/23/2008  . WART, LEFT HAND 07/19/2007  . ERECTILE DYSFUNCTION, MILD 07/19/2007  . HERNIA, BILATERAL INGUINAL W/O OBST/GANGRENE 07/19/2007  . MENISCUS TEAR 07/19/2007  . Bilateral inguinal hernia 07/19/2007    Arliss Journey , PT, DPT  04/22/2020, 3:29 PM  [facility] [address] [place], [place], [address] Phone: [phone]   Fax:  [phone]  Name: Ruben Chavez MRN: [id number] Date of Birth: [birth year]

## 2020-04-22 NOTE — Telephone Encounter (Signed)
Greatly appreciate your input in regards to him returning to driving.  He will be advised that he is not cleared to return to driving and may pursue driving rehab/eval if you feel as though this is appropriate prior to return to driving.  Please let me know if you have any questions or concerns.  Lovey Newcomer, can you please notify patient of above recommendations.  Thank you.   Per OT recommendations: Hi Blayke Pinera,    I agree that he is not ready for driving due to continued decreased awareness, decreased attention to Lt side, and question ability of processing speed and reaction time for driving.  Levada Dy (O.T.) saw him today and he was very adamant about driving. She explained her concerns with this, however he still wished to pursue a driving evaluation (due to his decreased awareness). We will give him that information next O.T. session. I will include Speech on this note as they may have more to add.    Redmond Baseman, OTR/L

## 2020-04-22 NOTE — Patient Instructions (Addendum)
   AROM: Wrist Extension   .  With palm down, bend wrist up. Repeat __10__ times per set.  Do __2__ sessions per day.  Then use other right hand to bring wrist up more, release right hand and try to hold left wrist up.    Flexion: ROM   Use fight hand to bend fingers at all 3 joints then release and try to maintain fist with left hand.  Then repeat for opening fingers Repeat 10 times. Repeat with other hand. Do 2 sessions per day.    Pronation (Active)    With elbow held at side, wrist straight and palm facing up, turn until palm faces down completely. Hold 5 seconds. Repeat 10 times. Do2 sessions per day. Activity: Use this motion to turn pages of a book.*    FINGER: Extension    Place fingers on top of card or towel.  Rest hand on surface, palm down. Open fingers until hand is flat. 10 reps per set, 2 sets per day.

## 2020-04-22 NOTE — Telephone Encounter (Signed)
In my opinion impulsivity, attention and awareness would hinder safety with driving. I would advise an OT driving eval; Claiborne Billings, do you agree?  Deneise Lever, MS, Actor

## 2020-04-22 NOTE — Telephone Encounter (Signed)
Hi Jessica,   I agree that he is not ready for driving due to continued decreased awareness, decreased attention to Lt side, and question ability of processing speed and reaction time for driving.  Levada Dy (O.T.) saw him today and he was very adamant about driving. She explained her concerns with this, however he still wished to pursue a driving evaluation (due to his decreased awareness). We will give him that information next O.T. session. I will include Speech on this note as they may have more to add.   Redmond Baseman, OTR/L

## 2020-04-22 NOTE — Telephone Encounter (Signed)
I did review all of your therapy notes from recent visit.  It appears as though he is still having moderate cognitive impairment which will likely limit his return to driving but wanted to clarify with all of you first.  If current concerns, do think he would benefit from driving rehab?  Greatly appreciate your assistance!

## 2020-04-24 ENCOUNTER — Ambulatory Visit: Payer: PPO | Admitting: Occupational Therapy

## 2020-04-24 ENCOUNTER — Ambulatory Visit: Payer: PPO | Admitting: Speech Pathology

## 2020-04-24 ENCOUNTER — Other Ambulatory Visit: Payer: Self-pay

## 2020-04-24 ENCOUNTER — Ambulatory Visit: Payer: PPO

## 2020-04-24 DIAGNOSIS — R41841 Cognitive communication deficit: Secondary | ICD-10-CM

## 2020-04-24 DIAGNOSIS — R278 Other lack of coordination: Secondary | ICD-10-CM | POA: Diagnosis not present

## 2020-04-24 DIAGNOSIS — R29818 Other symptoms and signs involving the nervous system: Secondary | ICD-10-CM

## 2020-04-24 DIAGNOSIS — R414 Neurologic neglect syndrome: Secondary | ICD-10-CM

## 2020-04-24 DIAGNOSIS — R41840 Attention and concentration deficit: Secondary | ICD-10-CM

## 2020-04-24 DIAGNOSIS — I69354 Hemiplegia and hemiparesis following cerebral infarction affecting left non-dominant side: Secondary | ICD-10-CM

## 2020-04-24 DIAGNOSIS — M6281 Muscle weakness (generalized): Secondary | ICD-10-CM

## 2020-04-24 NOTE — Therapy (Signed)
[facility] [address] [place], [place], [address] Phone: [phone]   Fax:  [phone]  Speech Language Pathology Treatment  Patient Details  Name: Ruben Chavez MRN: [id number] Date of Birth: 11/23/1951 Referring Provider (SLP): Lauraine Rinne, PA-C   Encounter Date: 04/24/2020  End of Session - 04/24/20 1635    Visit Number  11    Number of Visits  17    Date for SLP Re-Evaluation  04/19/20    Authorization Type  one copay for multiple disciplines if same day    SLP Start Time  1448    SLP Stop Time   1530    SLP Time Calculation (min)  42 min    Activity Tolerance  Patient tolerated treatment well       Past Medical History:  Diagnosis Date  . Elevated PSA   . Hyperlipidemia    pt says not anymore  . Hypertension   . Prostate cancer ([place]) 2013    Past Surgical History:  Procedure Laterality Date  . bilateral inguinal hernia  2008  . BUBBLE STUDY  01/28/2020   Procedure: BUBBLE STUDY;  Surgeon: [name], MD;  Location: [facility] ENDOSCOPY;  Service: Cardiovascular;;  . CYST REMOVAL TRUNK  2016   sebaceous cyst  . CYSTOSCOPY  04/22/2016   Procedure: CYSTOSCOPY;  Surgeon: [name], MD;  Location: [facility];  Service: Urology;;  . IR ANGIO VERTEBRAL SEL SUBCLAVIAN INNOMINATE UNI R MOD SED  01/25/2020  . IR CT HEAD LTD  01/25/2020  . IR PERCUTANEOUS ART THROMBECTOMY/INFUSION INTRACRANIAL INC DIAG ANGIO  01/25/2020  . KNEE ARTHROSCOPY  2007   right  . LOOP RECORDER INSERTION N/A 01/28/2020   Procedure: LOOP RECORDER INSERTION;  Surgeon: [name], MD;  Location: [facility] CV LAB;  Service: Cardiovascular;  Laterality: N/A;  . PROSTATE BIOPSY  2008  . PROSTATE BIOPSY  2013  . PROSTATE BIOPSY  01/28/2016  . RADIOACTIVE SEED IMPLANT N/A 04/22/2016   Procedure: RADIOACTIVE SEED IMPLANT/BRACHYTHERAPY IMPLANT;  Surgeon: [name], MD;  Location: [facility];  Service: Urology;  Laterality: N/A;  . RADIOLOGY WITH ANESTHESIA N/A 01/24/2020   Procedure: IR WITH ANESTHESIA;  Surgeon: [name], MD;  Location: [facility];  Service: Radiology;  Laterality: N/A;  . TEE WITHOUT CARDIOVERSION N/A 01/28/2020   Procedure: TRANSESOPHAGEAL ECHOCARDIOGRAM (TEE);  Surgeon: [name], MD;  Location: [facility] ENDOSCOPY;  Service: Cardiovascular;  Laterality: N/A;  . TONSILLECTOMY    . TOTAL HIP ARTHROPLASTY  2001   left  . TOTAL HIP ARTHROPLASTY Right 01/18/2018   Procedure: RIGHT TOTAL HIP ARTHROPLASTY ANTERIOR APPROACH;  Surgeon: [name], MD;  Location: [facility];  Service: Orthopedics;  Laterality: Right;    There were no vitals filed for this visit.  Subjective Assessment - 04/24/20 1451    Subjective  "I expressed my desire to drive as soon as possible."    Currently in Pain?  No/denies            ADULT SLP TREATMENT - 04/24/20 1634      General Information   Behavior/Cognition  Alert;Cooperative;Pleasant mood;Impulsive;Requires cueing      Treatment Provided   Treatment provided  Cognitive-Linquistic      Pain Assessment   Pain Assessment  No/denies pain      Cognitive-Linquistic Treatment   Treatment focused on  Cognition;Patient/family/caregiver education    Skilled Treatment  Patient told SLP about desire to drive and about  his conversation with OT. SLP reinforced OT recommendations and explained deficit areas to pt. Pt stated, "I feel confident." and SLP explained awareness, specifically self-awareness to patient. Pt then told SLP he almost refinanced his mortgage based on a [employer] ad, but backed out at the last minute due to a pamphlet he received about potential risks. SLP explained that impulsivity, judgment can be impacted by CVA. In mod complex reasoning task, SLP cued pt to review the task and consider how he might approach it. Pt started task before he completed review and did not implement strategy. SLP pointed out  to pt that he was reviewing the same clues multiple times, and pt began to check off clues with min-mod A when completed. Required usual mod cues for error awareness.       Assessment / Recommendations / Plan   Plan  Continue with current plan of care      Progression Toward Goals   Progression toward goals  Not progressing toward goals (comment)   awareness/insight. will d/c next visit      SLP Education - 04/24/20 1634    Education Details  deficit areas, cognitive processes governed by the right brain, self-awareness    Person(s) Educated  Patient    Methods  Explanation;Demonstration;Verbal cues    Comprehension  Verbalized understanding;Need further instruction       SLP Short Term Goals - 04/24/20 1638      SLP SHORT TERM GOAL #1   Title  Patient will use compensatory aids/strategies (electronic or paper) with occasional cues from spouse to be on time for appointments x3 sessions    Baseline  03-24-20, 03-26-20    Status  Partially Met      SLP SHORT TERM GOAL #2   Title  Patient will complete standardized cognitive testing within the first 3 sessions.    Status  Achieved      SLP SHORT TERM GOAL #3   Title  Patient will demonstrate appropriate topic maintenance over 5 minute conversation with occasional min cues x2 sessions.    Status  Not Met      SLP SHORT TERM GOAL #4   Title  pt will demo selective attention adequate to complete a 5-minute therapy task in 3 sessions    Baseline  03-24-20    Status  Partially Met      SLP SHORT TERM GOAL #5   Title  pt will demo functional organization in a therapy task, with occasional mod A    Status  Not Met       SLP Long Term Goals - 04/24/20 1638      SLP LONG TERM GOAL #1   Title  Patient will use compensatory aids/strategies (electronic or paper) to manage schedule, appointments, and daily tasks with rare min A x 4 sessions.    Baseline  03-31-20, 04/24/20    Time  1    Period  Weeks    Status  On-going      SLP LONG  TERM GOAL #2   Title  Pt will ID and correct errors on financial documents with occasional min A over 3 sessions    Time  1    Period  Weeks    Status  On-going      SLP LONG TERM GOAL #3   Title  Patient will demonstrate appropriate topic maintenance over 6 minute conversation with modified independence x 3 sessions.    Baseline  --    Time  1  Period  Weeks    Status  On-going      SLP LONG TERM GOAL #4   Title  pt will demo selective attention adequate for completing a simple to mod-complex 10-minute task, over three sessions    Time  1    Period  Weeks    Status  On-going       Plan - 04/24/20 1635    Clinical Impression Statement  Mr. Teron Blais presents with, functionally, moderate cognitive communication deficits. Pt demonstrated reduced awareness/insight into his deficit, which are relatively unchanged over past several sessions. Patient does acknowledge deficits with cues. See "skilled intervention" for more details. Will continue ST at 1x per week; expect d/c next session as pt reaching max rehab potential. I recommend cont'd skilled ST to address cognitive communication impairment to maximize safety and independence at home.    Speech Therapy Frequency  1x /week    Duration  --   8 weeks or 17 visits   Treatment/Interventions  Environmental controls;SLP instruction and feedback;Cueing hierarchy;Compensatory techniques;Cognitive reorganization;Functional tasks;Compensatory strategies;Internal/external aids;Multimodal communcation approach;Patient/family education    Potential to Achieve Goals  Good    Consulted and Agree with Plan of Care  Patient;Family member/caregiver       Patient will benefit from skilled therapeutic intervention in order to improve the following deficits and impairments:   Cognitive communication deficit    Problem List Patient Active Problem List   Diagnosis Date Noted  . Left hemiparesis ([place]) 03/28/2020  . Chronic bilateral low back  pain without sciatica 02/25/2020  . Spasticity as late effect of cerebrovascular accident (CVA) 02/25/2020  . Cognitive and neurobehavioral dysfunction   . Hypertensive urgency 01/28/2020  . Urinary retention 01/28/2020  . 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 Stroke (cerebrum) ([place]) -  [name] 01/25/2020  . Acute respiratory failure with hypoxia ([place])   . Hypotension   . Hypokalemia   . Hypocalcemia   . Encephalopathy acute   . Stroke ([place]) 01/24/2020  . Middle cerebral artery embolism, right 01/24/2020  . Hyperlipidemia 02/21/2019  . Impingement syndrome of right shoulder region 02/21/2018  . OA (osteoarthritis) of hip 01/18/2018  . Prostate cancer ([place]) 03/04/2016  . Essential hypertension 11/27/2014  . History of colonic polyps 09/26/2014  . BACK PAIN, LEFT 12/22/2009  . PROSTATE SPECIFIC ANTIGEN, ELEVATED 12/20/2008  . LATERAL EPICONDYLITIS 07/15/2008  . ACTINIC KERATOSIS, FOREHEAD, LEFT 05/23/2008  . WART, LEFT HAND 07/19/2007  . ERECTILE DYSFUNCTION, MILD 07/19/2007  . HERNIA, BILATERAL INGUINAL W/O OBST/GANGRENE 07/19/2007  . MENISCUS TEAR 07/19/2007  . Bilateral inguinal hernia 07/19/2007   Deneise Lever, [place], [place] 04/24/2020, 4:39 PM  [facility] [address] [place], [place], [address] Phone: [phone]   Fax:  [phone]   Name: RAFFERTY POSTLEWAIT MRN: [id number] Date of Birth: 1951/03/28

## 2020-04-24 NOTE — Patient Instructions (Signed)
Local Driver Evaluation Programs: ° °Comprehensive Evaluation: includes clinical and in vehicle behind the wheel testing by OCCUPATIONAL THERAPIST. Programs have varying levels of adaptive controls available for trial.  ° °[facility] °[address] °[place], [place]  [address] °[phone] or [phone] °http://www.driver-rehab.com °Evaluator:  Cyndee Crompton, OT/CDRS/CDI/SCDCM/Low Vision Certification ° °[facility] °[address] °[place], [place] [address] °[phone] °https://www.[facility].org/home/services/rehabilitation.aspx °Evaluators:  Shannon Sheek, OT and Jill Tucker, OT ° °[facility] [place] [facility] (ONLY SERVES VETERANS!!) °[facility] °[address] °[place],   [address] °[phone] °http://www.[place].va.gov/services/Physical_Medicine_Rehabilitation_Services.asp °Evaluators:  Eric Andrews, KT; Heidi Harris, KT;  Gary Whitaker, KT (KT=kiniesotherapist) ° ° °Clinical evaluations only:  Includes clinical testing, refers to other programs or local certified driving instructor for behind the wheel testing. ° °[facility] (outpatient Rehab) °[facility] °[address] °[place],  [address] °[phone] for scheduling °http://www.[facility].edu/Outpatient-Rehabilitation/Neurorehabilitation-Therapy.htm °Evaluators:  Tomeshia Pizzi [place], OT; Kate Phillips, OT ° °Other area clinical evaluators available upon request including [facility], [facility] and [facility]. ° ° °    Resource List °What is a Driver Evaluation: °Your Road Ahead - A Guide to Comprehensive Driving Evaluations °http://www.thehartford.com/resources/mature-market-excellence/publications-on-aging ° °Association for [employer] Rehabilitation Services - Disability and Driving Fact Sheets °http://www.aded.net/?page=510 ° °Driving after a Brain  Injury: °[employer] °http://www.biausa.org/tbims-abstracts/if-there-is-an-effective-way-to-determine-if-someone-is-ready-to-drive-after-tbi?A=SearchResult&SearchID=9495675&ObjectID=2758842&ObjectType=35 ° °Driving with Adaptive Equipment: °[facility] °http://www.driver-rehab.com/adaptive-equipment ° °[employer] °http://www.nmeda.com/ ° ° ° ° ° ° °  °

## 2020-04-24 NOTE — Therapy (Signed)
[facility] [address] [place], [place], [address] Phone: [phone]   Fax:  [phone]  Occupational Therapy Treatment/Renewal  Patient Details  Name: Ruben Chavez MRN: [id number] Date of Birth: 01-Jan-1951 Referring Provider (OT): Lauraine Rinne, PA-C   Encounter Date: 04/24/2020  OT End of Session - 04/24/20 1552    Visit Number  17    Number of Visits  33    Date for OT Re-Evaluation  06/22/20    Authorization Type  [employer] (follow Medicare)--1 copay for multiple visits    Authorization Time Period  cert date 1/19 - 03/23/39    Authorization - Visit Number  79    Authorization - Number of Visits  20    Progress Note Due on Visit  20    OT Start Time  1532    OT Stop Time  1615    OT Time Calculation (min)  43 min    Activity Tolerance  Patient tolerated treatment well    Behavior During Therapy  [facility] for tasks assessed/performed       Past Medical History:  Diagnosis Date  . Elevated PSA   . Hyperlipidemia    pt says not anymore  . Hypertension   . Prostate cancer ([place]) 2013    Past Surgical History:  Procedure Laterality Date  . bilateral inguinal hernia  2008  . BUBBLE STUDY  01/28/2020   Procedure: BUBBLE STUDY;  Surgeon: [name], MD;  Location: [facility] ENDOSCOPY;  Service: Cardiovascular;;  . CYST REMOVAL TRUNK  2016   sebaceous cyst  . CYSTOSCOPY  04/22/2016   Procedure: CYSTOSCOPY;  Surgeon: [name], MD;  Location: [facility];  Service: Urology;;  . IR ANGIO VERTEBRAL SEL SUBCLAVIAN INNOMINATE UNI R MOD SED  01/25/2020  . IR CT HEAD LTD  01/25/2020  . IR PERCUTANEOUS ART THROMBECTOMY/INFUSION INTRACRANIAL INC DIAG ANGIO  01/25/2020  . KNEE ARTHROSCOPY  2007   right  . LOOP RECORDER INSERTION N/A 01/28/2020   Procedure: LOOP RECORDER INSERTION;  Surgeon: [name], MD;  Location: [place] CV LAB;  Service: Cardiovascular;  Laterality: N/A;  . PROSTATE  BIOPSY  2008  . PROSTATE BIOPSY  2013  . PROSTATE BIOPSY  01/28/2016  . RADIOACTIVE SEED IMPLANT N/A 04/22/2016   Procedure: RADIOACTIVE SEED IMPLANT/BRACHYTHERAPY IMPLANT;  Surgeon: [name], MD;  Location: [facility];  Service: Urology;  Laterality: N/A;  . RADIOLOGY WITH ANESTHESIA N/A 01/24/2020   Procedure: IR WITH ANESTHESIA;  Surgeon: [name], MD;  Location: [facility];  Service: Radiology;  Laterality: N/A;  . TEE WITHOUT CARDIOVERSION N/A 01/28/2020   Procedure: TRANSESOPHAGEAL ECHOCARDIOGRAM (TEE);  Surgeon: [name], MD;  Location: [facility] ENDOSCOPY;  Service: Cardiovascular;  Laterality: N/A;  . TONSILLECTOMY    . TOTAL HIP ARTHROPLASTY  2001   left  . TOTAL HIP ARTHROPLASTY Right 01/18/2018   Procedure: RIGHT TOTAL HIP ARTHROPLASTY ANTERIOR APPROACH;  Surgeon: [name], MD;  Location: [facility];  Service: Orthopedics;  Laterality: Right;    There were no vitals filed for this visit.  Subjective Assessment - 04/24/20 1530    Pertinent History  PMH:  hyperlipidemia, hypertension, prostate cancer radioactive seed implant 2017, R total hip arthroplasty, loop recorder insertion    Limitations  loop recorder    Patient Stated Goals  get my L hand and fingers working    Currently in Pain?  No/denies       Assessed remaining goals  and progress to date. Box & Blocks LUE: 4  Issued driving evaluation info and discussed.  Pt picking up 1" blocks and placing in bowl w/ max difficulty Lt hand. Bilateral sh flexion with palms together to help prevent compensations and increase awareness LUE.  Environmental scanning in moderately busy gym finding 9/13 items (69% accuracy), pt found 3/4 remaining items on 2nd pass with cues.                     OT Education - 04/24/20 1532    Education Details  Driving Evaluation info    Person(s) Educated  Patient    Methods  Explanation;Handout    Comprehension  Verbalized understanding       OT  Short Term Goals - 04/22/20 1400      OT SHORT TERM GOAL #1   Title  Pt will be independent with initial HEP--check STG 03/24/20    Time  4    Period  Weeks    Status  Achieved      OT SHORT TERM GOAL #2   Title  Pt will verbalize understanding of proper positioning of LUE to prevent pain/injury and edema management techniques.    Time  4    Period  Weeks    Status  Achieved      OT SHORT TERM GOAL #3   Title  Pt will be independent with splint wear/care for improved positioning.    Time  4    Period  Weeks    Status  Achieved      OT SHORT TERM GOAL #4   Title  Pt will be able to use LUE as a stabilizer for simple ADL tasks.    Time  4    Period  Weeks    Status  On-going   04/22/20:  inconsistent     OT SHORT TERM GOAL #5   Title  Pt will demo at least 25% gross finger flex in prep for functional grasp.    Baseline  4    Time  4    Period  Weeks    Status  Achieved   except index and thumb       OT Long Term Goals - 04/24/20 1734      OT LONG TERM GOAL #1   Title  Pt will be independent with updated HEP.--check LTGs 04/23/20    Time  8    Period  Weeks    Status  On-going      OT LONG TERM GOAL #2   Title  Pt will be able to use LUE as nondominant assist for ADLs at least 50% of the time.    Time  8    Period  Weeks    Status  On-going   04/22/20:  25% of the time     OT LONG TERM GOAL #3   Title  Pt will be able to score at least 10 on box and blocks test with LUE for improved coordination for ADLs.    Time  8    Period  Weeks    Status  On-going   04/24/20:  4 blocks     OT LONG TERM GOAL #4   Title  Pt will complete clothing fasteners mod I using AE prn.    Time  8    Period  Weeks    Status  On-going   04/24/20: unable to use Lt hand for this at this time  OT LONG TERM GOAL #5   Title  Pt will demo improved attention/visual scanning to be able to perform environmental scanning in busy enivornment with at least 95% accuracy for incr safety for  community activities/driving    Baseline  03/14/20;  58% initially    Time  8    Period  Weeks    Status  On-going   04/24/20: 69% accuracy     OT LONG TERM GOAL #6   Title  Pt will demo at least 75% gross finger flex/ext for grasp/release of objects.    Time  8    Period  Weeks    Status  On-going   04/22/20:  approx 75% extension approx 50% flexion (performance inconsistent at times)           Plan - 04/24/20 1735    Clinical Impression Statement  Renewal completed today to continue O.T. for additional 8 weeks. Pt progressing slowly with LUE function as well as awareness and attention.    Occupational performance deficits (Please refer to evaluation for details):  ADL's;IADL's;Leisure;Social Participation;Work    Marketing executive / Function / Physical Skills  ADL;Dexterity;ROM;IADL;Sensation;Mobility;Strength;FMC;Coordination;Decreased knowledge of precautions;Decreased knowledge of use of DME;Pain;UE functional use;GMC;Proprioception    Cognitive Skills  Attention;Memory;Perception;Safety Awareness    Rehab Potential  Fair    OT Frequency  2x / week    OT Duration  8 weeks   additional   OT Treatment/Interventions  Self-care/ADL training;Moist Heat;Fluidtherapy;DME and/or AE instruction;Splinting;Therapeutic activities;Contrast Bath;Aquatic Therapy;Ultrasound;Therapeutic exercise;Cognitive remediation/compensation;Visual/perceptual remediation/compensation;Passive range of motion;Functional Mobility Training;Neuromuscular education;Cryotherapy;Electrical Stimulation;Paraffin;Energy conservation;Manual Therapy;Patient/family education    Plan  continue LUE function and awareness    Consulted and Agree with Plan of Care  Patient       Patient will benefit from skilled therapeutic intervention in order to improve the following deficits and impairments:   Body Structure / Function / Physical Skills: ADL, Dexterity, ROM, IADL, Sensation, Mobility, Strength, FMC, Coordination, Decreased  knowledge of precautions, Decreased knowledge of use of DME, Pain, UE functional use, GMC, Proprioception Cognitive Skills: Attention, Memory, Perception, Safety Awareness     Visit Diagnosis: Hemiplegia and hemiparesis following cerebral infarction affecting left non-dominant side (HCC)  Other lack of coordination  Other symptoms and signs involving the nervous system    Problem List Patient Active Problem List   Diagnosis Date Noted  . Left hemiparesis ([place]) 03/28/2020  . Chronic bilateral low back pain without sciatica 02/25/2020  . Spasticity as late effect of cerebrovascular accident (CVA) 02/25/2020  . Cognitive and neurobehavioral dysfunction   . Hypertensive urgency 01/28/2020  . Urinary retention 01/28/2020  . Right middle cerebral artery stroke ([place]) 01/28/2020  . Stroke (cerebrum) ([place]) -  [name] 01/25/2020  . Acute respiratory failure with hypoxia ([place])   . Hypotension   . Hypokalemia   . Hypocalcemia   . Encephalopathy acute   . Stroke ([place]) 01/24/2020  . Middle cerebral artery embolism, right 01/24/2020  . Hyperlipidemia 02/21/2019  . Impingement syndrome of right shoulder region 02/21/2018  . OA (osteoarthritis) of hip 01/18/2018  . Prostate cancer ([place]) 03/04/2016  . Essential hypertension 11/27/2014  . History of colonic polyps 09/26/2014  . BACK PAIN, LEFT 12/22/2009  . PROSTATE SPECIFIC ANTIGEN, ELEVATED 12/20/2008  . LATERAL EPICONDYLITIS 07/15/2008  . ACTINIC KERATOSIS, FOREHEAD, LEFT 05/23/2008  . WART, LEFT HAND 07/19/2007  . ERECTILE DYSFUNCTION, MILD 07/19/2007  . HERNIA, BILATERAL  INGUINAL W/O OBST/GANGRENE 07/19/2007  . MENISCUS TEAR 07/19/2007  . Bilateral inguinal hernia 07/19/2007    Carey Bullocks, OTR/L 04/24/2020, 5:38 PM  [facility] [address] [place], [place], [address] Phone: [phone]   Fax:  [phone]  Name: Ruben Chavez MRN: [id number] Date of Birth: 16-Apr-1951

## 2020-04-29 ENCOUNTER — Ambulatory Visit: Payer: PPO | Admitting: Speech Pathology

## 2020-04-29 ENCOUNTER — Ambulatory Visit: Payer: PPO | Admitting: Occupational Therapy

## 2020-04-29 ENCOUNTER — Ambulatory Visit: Payer: PPO | Admitting: Physical Therapy

## 2020-04-29 ENCOUNTER — Telehealth: Payer: Self-pay | Admitting: Adult Health

## 2020-04-29 ENCOUNTER — Other Ambulatory Visit: Payer: Self-pay

## 2020-04-29 DIAGNOSIS — I69354 Hemiplegia and hemiparesis following cerebral infarction affecting left non-dominant side: Secondary | ICD-10-CM

## 2020-04-29 DIAGNOSIS — R278 Other lack of coordination: Secondary | ICD-10-CM | POA: Diagnosis not present

## 2020-04-29 DIAGNOSIS — R41840 Attention and concentration deficit: Secondary | ICD-10-CM

## 2020-04-29 DIAGNOSIS — R29818 Other symptoms and signs involving the nervous system: Secondary | ICD-10-CM

## 2020-04-29 DIAGNOSIS — R41841 Cognitive communication deficit: Secondary | ICD-10-CM

## 2020-04-29 NOTE — Therapy (Signed)
[facility] [address] [place], [place], [address] Phone: [phone]   Fax:  [phone]  Speech Language Pathology Treatment and Discharge Summary  Patient Details  Name: Ruben Chavez MRN: [id number] Date of Birth: January 31, 1951 Referring Provider (SLP): Lauraine Rinne, PA-C   Encounter Date: 04/29/2020  End of Session - 04/29/20 1655    Visit Number  12    Number of Visits  17    Date for SLP Re-Evaluation  04/19/20    Authorization Type  one copay for multiple disciplines if same day    SLP Start Time  1402    SLP Stop Time   1445    SLP Time Calculation (min)  43 min    Activity Tolerance  Patient tolerated treatment well       Past Medical History:  Diagnosis Date  . Elevated PSA   . Hyperlipidemia    pt says not anymore  . Hypertension   . Prostate cancer ([place]) 2013    Past Surgical History:  Procedure Laterality Date  . bilateral inguinal hernia  2008  . BUBBLE STUDY  01/28/2020   Procedure: BUBBLE STUDY;  Surgeon: [name], MD;  Location: [facility] ENDOSCOPY;  Service: Cardiovascular;;  . CYST REMOVAL TRUNK  2016   sebaceous cyst  . CYSTOSCOPY  04/22/2016   Procedure: CYSTOSCOPY;  Surgeon: [name], MD;  Location: [facility];  Service: Urology;;  . IR ANGIO VERTEBRAL SEL SUBCLAVIAN INNOMINATE UNI R MOD SED  01/25/2020  . IR CT HEAD LTD  01/25/2020  . IR PERCUTANEOUS ART THROMBECTOMY/INFUSION INTRACRANIAL INC DIAG ANGIO  01/25/2020  . KNEE ARTHROSCOPY  2007   right  . LOOP RECORDER INSERTION N/A 01/28/2020   Procedure: LOOP RECORDER INSERTION;  Surgeon: [name], MD;  Location: [place] CV LAB;  Service: Cardiovascular;  Laterality: N/A;  . PROSTATE BIOPSY  2008  . PROSTATE BIOPSY  2013  . PROSTATE BIOPSY  01/28/2016  . RADIOACTIVE SEED IMPLANT N/A 04/22/2016   Procedure: RADIOACTIVE SEED IMPLANT/BRACHYTHERAPY IMPLANT;  Surgeon: [name], MD;  Location:  [facility];  Service: Urology;  Laterality: N/A;  . RADIOLOGY WITH ANESTHESIA N/A 01/24/2020   Procedure: IR WITH ANESTHESIA;  Surgeon: [name], MD;  Location: [facility];  Service: Radiology;  Laterality: N/A;  . TEE WITHOUT CARDIOVERSION N/A 01/28/2020   Procedure: TRANSESOPHAGEAL ECHOCARDIOGRAM (TEE);  Surgeon: [name], MD;  Location: [facility] ENDOSCOPY;  Service: Cardiovascular;  Laterality: N/A;  . TONSILLECTOMY    . TOTAL HIP ARTHROPLASTY  2001   left  . TOTAL HIP ARTHROPLASTY Right 01/18/2018   Procedure: RIGHT TOTAL HIP ARTHROPLASTY ANTERIOR APPROACH;  Surgeon: [name], MD;  Location: [facility];  Service: Orthopedics;  Laterality: Right;    There were no vitals filed for this visit.  Subjective Assessment - 04/29/20 1407    Subjective  "I'm on the waiting list for the driving test."    Currently in Pain?  No/denies            ADULT SLP TREATMENT - 04/29/20 1408      General Information   Behavior/Cognition  Alert;Cooperative;Pleasant mood;Impulsive;Requires cueing      Treatment Provided   Treatment provided  Cognitive-Linquistic      Pain Assessment   Pain Assessment  No/denies pain      Cognitive-Linquistic Treatment   Treatment focused on  Cognition;Patient/family/caregiver education    Skilled Treatment  Patient reports he is on waiting list  for OT driving evaluation. Completed homework which was 100% accurate. In mod complex tasks today, pt made errors primarily due to attention to detail and impulsivity. He asked SLP to repeat verbal instructions several times but then did not fully listen to instructions, missing details. When SLP pointed this out to pt, he required mod cues to acknowledge this. Mod cues for impulsivity when reviewing MyChart data, packing up his bag at end of session. He forgot to ask OT a question about exercise; with min question cue pt set an alert for an appropriate time (at the start of his next OT session). Patient  in agreement with d/c today. Educated on ongoing use of schedule and alerts, and strategies such as slowing down, double checking himself.       Assessment / Recommendations / Plan   Plan  Discharge SLP treatment due to (comment)   lack of progress     Progression Toward Goals   Progression toward goals  Not progressing toward goals (comment)   awareness/insight limited progress; level of cuing stable      SLP Education - 04/29/20 1655    Education Details  use of compensatory aids and strategies to improve function with attention/recall    Person(s) Educated  Patient    Methods  Explanation    Comprehension  Verbalized understanding       SLP Short Term Goals - 04/29/20 1657      SLP SHORT TERM GOAL #1   Title  Patient will use compensatory aids/strategies (electronic or paper) with occasional cues from spouse to be on time for appointments x3 sessions    Baseline  03-24-20, 03-26-20    Status  Partially Met      SLP SHORT TERM GOAL #2   Title  Patient will complete standardized cognitive testing within the first 3 sessions.    Status  Achieved      SLP SHORT TERM GOAL #3   Title  Patient will demonstrate appropriate topic maintenance over 5 minute conversation with occasional min cues x2 sessions.    Status  Not Met      SLP SHORT TERM GOAL #4   Title  pt will demo selective attention adequate to complete a 5-minute therapy task in 3 sessions    Baseline  03-24-20    Status  Partially Met      SLP SHORT TERM GOAL #5   Title  pt will demo functional organization in a therapy task, with occasional mod A    Status  Not Met       SLP Long Term Goals - 04/29/20 1658      SLP LONG TERM GOAL #1   Title  Patient will use compensatory aids/strategies (electronic or paper) to manage schedule, appointments, and daily tasks with rare min A x 4 sessions.    Baseline  03-31-20, 04/24/20 04/29/20    Time  1    Period  Weeks    Status  Partially Met      SLP LONG TERM GOAL #2    Title  Pt will ID and correct errors on financial documents with occasional min A over 3 sessions    Time  1    Period  Weeks    Status  Not Met      SLP LONG TERM GOAL #3   Title  Patient will demonstrate appropriate topic maintenance over 6 minute conversation with modified independence x 3 sessions.    Time  1    Period  Weeks    Status  Not Met      SLP LONG TERM GOAL #4   Title  pt will demo selective attention adequate for completing a simple to mod-complex 10-minute task, over three sessions    Time  1    Period  Weeks    Status  Not Met       Plan - 04/29/20 1655    Clinical Impression Statement  Mr. Damien Batty presents with, functionally, moderate cognitive communication deficits. Pt awareness/insight into his deficits are relatively unchanged over past several sessions. Patient does acknowledge deficits with cues. Level of cuing necessary/ impulsivity is commensurate with previous sessions. See "skilled intervention" for more details. Patient in agreement with d/c today; SLP feels pt has reached max rehab potential at this time.    Speech Therapy Frequency  1x /week    Duration  --   8 weeks or 17 visits   Treatment/Interventions  Environmental controls;SLP instruction and feedback;Cueing hierarchy;Compensatory techniques;Cognitive reorganization;Functional tasks;Compensatory strategies;Internal/external aids;Multimodal communcation approach;Patient/family education    Potential to Achieve Goals  Good    Consulted and Agree with Plan of Care  Patient;Family member/caregiver       Patient will benefit from skilled therapeutic intervention in order to improve the following deficits and impairments:   Cognitive communication deficit    Problem List Patient Active Problem List   Diagnosis Date Noted  . Left hemiparesis ([place]) 03/28/2020  . Chronic bilateral low back pain without sciatica 02/25/2020  . Spasticity as late effect of cerebrovascular accident (CVA)  02/25/2020  . Cognitive and neurobehavioral dysfunction   . Hypertensive urgency 01/28/2020  . Urinary retention 01/28/2020  . 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 Stroke (cerebrum) ([place]) -  [name] 01/25/2020  . Acute respiratory failure with hypoxia ([place])   . Hypotension   . Hypokalemia   . Hypocalcemia   . Encephalopathy acute   . Stroke ([place]) 01/24/2020  . Middle cerebral artery embolism, right 01/24/2020  . Hyperlipidemia 02/21/2019  . Impingement syndrome of right shoulder region 02/21/2018  . OA (osteoarthritis) of hip 01/18/2018  . Prostate cancer ([place]) 03/04/2016  . Essential hypertension 11/27/2014  . History of colonic polyps 09/26/2014  . BACK PAIN, LEFT 12/22/2009  . PROSTATE SPECIFIC ANTIGEN, ELEVATED 12/20/2008  . LATERAL EPICONDYLITIS 07/15/2008  . ACTINIC KERATOSIS, FOREHEAD, LEFT 05/23/2008  . WART, LEFT HAND 07/19/2007  . ERECTILE DYSFUNCTION, MILD 07/19/2007  . HERNIA, BILATERAL INGUINAL W/O OBST/GANGRENE 07/19/2007  . MENISCUS TEAR 07/19/2007  . Bilateral inguinal hernia 07/19/2007   SPEECH THERAPY DISCHARGE SUMMARY  Visits from Start of Care: 12  Current functional level related to goals / functional outcomes: Patient requires moderate cues for attention to detail, awareness, mod complex problem solving. He is using compensatory aid (phone calendar) to manage appointments.   Remaining deficits: Moderate cognitive deficits persist   Education / Equipment: Compensations for recall/attention, slow down and double check for accuracy,OT driving evaluation is recommended prior to driving due to cognitive deficits SLP feels would impact his safety with driving Plan: Patient agrees to discharge.  Patient goals were partially met. Patient is being discharged due to lack of progress.  ?????         Deneise Lever, [place],  CCC-SLP Speech-Language Pathologist  Aliene Altes 04/29/2020, 4:59 PM  [facility] [address]  [place], [place], [address] Phone: [phone]   Fax:  [phone]   Name: CONLAN MICELI MRN: [id number] Date of Birth: [birth year]

## 2020-04-29 NOTE — Therapy (Signed)
[facility] [address] [place], [place], [address] Phone: [phone]   Fax:  [phone]  Occupational Therapy Treatment  Patient Details  Name: Ruben Chavez MRN: [id number] Date of Birth: [birth year] Referring Provider (OT): Lauraine Rinne, PA-C   Encounter Date: 04/29/2020  OT End of Session - 04/29/20 1415    Visit Number  18    Number of Visits  33    Date for OT Re-Evaluation  06/22/20    Authorization Type  [employer] (follow Medicare)--1 copay for multiple visits    Authorization Time Period  cert date 2/70 - 3/50/09    Authorization - Visit Number  28    Authorization - Number of Visits  20    Progress Note Due on Visit  20    OT Start Time  1320    OT Stop Time  1400    OT Time Calculation (min)  40 min    Activity Tolerance  Patient tolerated treatment well       Past Medical History:  Diagnosis Date  . Elevated PSA   . Hyperlipidemia    pt says not anymore  . Hypertension   . Prostate cancer ([place]) 2013    Past Surgical History:  Procedure Laterality Date  . bilateral inguinal hernia  2008  . BUBBLE STUDY  01/28/2020   Procedure: BUBBLE STUDY;  Surgeon: [name], MD;  Location: [facility] ENDOSCOPY;  Service: Cardiovascular;;  . CYST REMOVAL TRUNK  2016   sebaceous cyst  . CYSTOSCOPY  04/22/2016   Procedure: CYSTOSCOPY;  Surgeon: [name], MD;  Location: [facility];  Service: Urology;;  . IR ANGIO VERTEBRAL SEL SUBCLAVIAN INNOMINATE UNI R MOD SED  01/25/2020  . IR CT HEAD LTD  01/25/2020  . IR PERCUTANEOUS ART THROMBECTOMY/INFUSION INTRACRANIAL INC DIAG ANGIO  01/25/2020  . KNEE ARTHROSCOPY  2007   right  . LOOP RECORDER INSERTION N/A 01/28/2020   Procedure: LOOP RECORDER INSERTION;  Surgeon: [name], MD;  Location: [facility] CV LAB;  Service: Cardiovascular;  Laterality: N/A;  . PROSTATE BIOPSY  2008  . PROSTATE BIOPSY  2013  . PROSTATE BIOPSY  01/28/2016   . RADIOACTIVE SEED IMPLANT N/A 04/22/2016   Procedure: RADIOACTIVE SEED IMPLANT/BRACHYTHERAPY IMPLANT;  Surgeon: [name], MD;  Location: [facility];  Service: Urology;  Laterality: N/A;  . RADIOLOGY WITH ANESTHESIA N/A 01/24/2020   Procedure: IR WITH ANESTHESIA;  Surgeon: [name], MD;  Location: [facility];  Service: Radiology;  Laterality: N/A;  . TEE WITHOUT CARDIOVERSION N/A 01/28/2020   Procedure: TRANSESOPHAGEAL ECHOCARDIOGRAM (TEE);  Surgeon: [name], MD;  Location: [facility] ENDOSCOPY;  Service: Cardiovascular;  Laterality: N/A;  . TONSILLECTOMY    . TOTAL HIP ARTHROPLASTY  2001   left  . TOTAL HIP ARTHROPLASTY Right 01/18/2018   Procedure: RIGHT TOTAL HIP ARTHROPLASTY ANTERIOR APPROACH;  Surgeon: [name], MD;  Location: [facility];  Service: Orthopedics;  Laterality: Right;    There were no vitals filed for this visit.  Subjective Assessment - 04/29/20 1325    Subjective   I called and got the info on the driving evaluation    Pertinent History  PMH:  hyperlipidemia, hypertension, prostate cancer radioactive seed implant 2017, R total hip arthroplasty, loop recorder insertion    Limitations  loop recorder    Patient Stated Goals  get my L hand and fingers working    Currently in Pain?  No/denies  Pt flipping large cards with min assist required for full finger extension and for control with forearm/wrist.   Pt gave update on driving evaluation and has placed call for physician referral.  Practiced bilateral sh flexion to eye level seated, with thick foam to encourage proper positioning of shoulder and forearm for neutral rotation. Progressed to functional reaching LUE in cabinet to retrieve cone and place on counter w/ min assist required.                      OT Short Term Goals - 04/22/20 1400      OT SHORT TERM GOAL #1   Title  Pt will be independent with initial HEP--check STG 03/24/20    Time  4    Period  Weeks     Status  Achieved      OT SHORT TERM GOAL #2   Title  Pt will verbalize understanding of proper positioning of LUE to prevent pain/injury and edema management techniques.    Time  4    Period  Weeks    Status  Achieved      OT SHORT TERM GOAL #3   Title  Pt will be independent with splint wear/care for improved positioning.    Time  4    Period  Weeks    Status  Achieved      OT SHORT TERM GOAL #4   Title  Pt will be able to use LUE as a stabilizer for simple ADL tasks.    Time  4    Period  Weeks    Status  On-going   04/22/20:  inconsistent     OT SHORT TERM GOAL #5   Title  Pt will demo at least 25% gross finger flex in prep for functional grasp.    Baseline  4    Time  4    Period  Weeks    Status  Achieved   except index and thumb       OT Long Term Goals - 04/24/20 1734      OT LONG TERM GOAL #1   Title  Pt will be independent with updated HEP.--check LTGs 04/23/20    Time  8    Period  Weeks    Status  On-going      OT LONG TERM GOAL #2   Title  Pt will be able to use LUE as nondominant assist for ADLs at least 50% of the time.    Time  8    Period  Weeks    Status  On-going   04/22/20:  25% of the time     OT LONG TERM GOAL #3   Title  Pt will be able to score at least 10 on box and blocks test with LUE for improved coordination for ADLs.    Time  8    Period  Weeks    Status  On-going   04/24/20:  4 blocks     OT LONG TERM GOAL #4   Title  Pt will complete clothing fasteners mod I using AE prn.    Time  8    Period  Weeks    Status  On-going   04/24/20: unable to use Lt hand for this at this time     OT LONG TERM GOAL #5   Title  Pt will demo improved attention/visual scanning to be able to perform environmental scanning in busy enivornment with at least 95% accuracy for  incr safety for community activities/driving    Baseline  03/14/20;  58% initially    Time  8    Period  Weeks    Status  On-going   04/24/20: 69% accuracy     OT LONG TERM GOAL  #6   Title  Pt will demo at least 75% gross finger flex/ext for grasp/release of objects.    Time  8    Period  Weeks    Status  On-going   04/22/20:  approx 75% extension approx 50% flexion (performance inconsistent at times)           Plan - 04/29/20 1420    Clinical Impression Statement  Pt slowly progressing with LUE function and awareness.    Occupational performance deficits (Please refer to evaluation for details):  ADL's;IADL's;Leisure;Social Participation;Work    Marketing executive / Function / Physical Skills  ADL;Dexterity;ROM;IADL;Sensation;Mobility;Strength;FMC;Coordination;Decreased knowledge of precautions;Decreased knowledge of use of DME;Pain;UE functional use;GMC;Proprioception    Cognitive Skills  Attention;Memory;Perception;Safety Awareness    OT Frequency  2x / week    OT Duration  8 weeks    OT Treatment/Interventions  Self-care/ADL training;Moist Heat;Fluidtherapy;DME and/or AE instruction;Splinting;Therapeutic activities;Contrast Bath;Aquatic Therapy;Ultrasound;Therapeutic exercise;Cognitive remediation/compensation;Visual/perceptual remediation/compensation;Passive range of motion;Functional Mobility Training;Neuromuscular education;Cryotherapy;Electrical Stimulation;Paraffin;Energy conservation;Manual Therapy;Patient/family education    Plan  practice functional reaching, estim for full composite extension (pt can perform wrist ext but not in conjunction with finger ext)    Consulted and Agree with Plan of Care  Patient       Patient will benefit from skilled therapeutic intervention in order to improve the following deficits and impairments:   Body Structure / Function / Physical Skills: ADL, Dexterity, ROM, IADL, Sensation, Mobility, Strength, FMC, Coordination, Decreased knowledge of precautions, Decreased knowledge of use of DME, Pain, UE functional use, GMC, Proprioception Cognitive Skills: Attention, Memory, Perception, Safety Awareness     Visit  Diagnosis: Hemiplegia and hemiparesis following cerebral infarction affecting left non-dominant side (HCC)  Other symptoms and signs involving the nervous system  Attention and concentration deficit    Problem List Patient Active Problem List   Diagnosis Date Noted  . Left hemiparesis ([place]) 03/28/2020  . Chronic bilateral low back pain without sciatica 02/25/2020  . Spasticity as late effect of cerebrovascular accident (CVA) 02/25/2020  . Cognitive and neurobehavioral dysfunction   . Hypertensive urgency 01/28/2020  . Urinary retention 01/28/2020  . Right middle cerebral artery stroke ([place]) 01/28/2020  . Stroke (cerebrum) ([place]) -  [name] 01/25/2020  . Acute respiratory failure with hypoxia ([place])   . Hypotension   . Hypokalemia   . Hypocalcemia   . Encephalopathy acute   . Stroke ([place]) 01/24/2020  . Middle cerebral artery embolism, right 01/24/2020  . Hyperlipidemia 02/21/2019  . Impingement syndrome of right shoulder region 02/21/2018  . OA (osteoarthritis) of hip 01/18/2018  . Prostate cancer ([place]) 03/04/2016  . Essential hypertension 11/27/2014  . History of colonic polyps 09/26/2014  . BACK PAIN, LEFT 12/22/2009  . PROSTATE SPECIFIC ANTIGEN, ELEVATED 12/20/2008  . LATERAL EPICONDYLITIS 07/15/2008  . ACTINIC KERATOSIS, FOREHEAD, LEFT 05/23/2008  . WART, LEFT HAND 07/19/2007  . ERECTILE DYSFUNCTION, MILD 07/19/2007  . HERNIA, BILATERAL INGUINAL W/O OBST/GANGRENE 07/19/2007  . MENISCUS TEAR 07/19/2007  . Bilateral inguinal hernia 07/19/2007    Carey Bullocks, OTR/L 04/29/2020, 2:21 PM  [facility] [address] [place], [place],  [address] Phone: [phone]   Fax:  [phone]  Name: Ruben Chavez MRN: [id number] Date of Birth: 09/21/1951

## 2020-04-29 NOTE — Telephone Encounter (Signed)
Patient requesting a referral to [facility] for driving evaluation. [facility] fax number [phone].  Att: Driving Evaluation. Patient's best call back number is [phone].

## 2020-04-29 NOTE — Telephone Encounter (Addendum)
I spoke with Janett Billow NP who has authorized referral to [facility] for a driving evaluation. Form completed, signed and faxed to [facility] Received a receipt of confirmation.  I called the pt. LVM with call back number. When the patient calls back, please let him know we faxed the referral to [facility] driving evaluation this evening and they should call him to schedule. If he doesn't hear from them soon, he can call [phone].

## 2020-05-01 ENCOUNTER — Ambulatory Visit: Payer: PPO

## 2020-05-01 ENCOUNTER — Ambulatory Visit: Payer: PPO | Admitting: Occupational Therapy

## 2020-05-01 ENCOUNTER — Other Ambulatory Visit: Payer: Self-pay

## 2020-05-01 DIAGNOSIS — R278 Other lack of coordination: Secondary | ICD-10-CM | POA: Diagnosis not present

## 2020-05-01 DIAGNOSIS — R29818 Other symptoms and signs involving the nervous system: Secondary | ICD-10-CM

## 2020-05-01 DIAGNOSIS — R41840 Attention and concentration deficit: Secondary | ICD-10-CM

## 2020-05-01 DIAGNOSIS — R414 Neurologic neglect syndrome: Secondary | ICD-10-CM

## 2020-05-01 DIAGNOSIS — I69354 Hemiplegia and hemiparesis following cerebral infarction affecting left non-dominant side: Secondary | ICD-10-CM

## 2020-05-01 DIAGNOSIS — M6281 Muscle weakness (generalized): Secondary | ICD-10-CM

## 2020-05-01 NOTE — Therapy (Signed)
[facility] [address] [place], [place], [address] Phone: [phone]   Fax:  [phone]  Occupational Therapy Treatment  Patient Details  Name: Ruben Chavez MRN: [id number] Date of Birth: 1951/07/31 Referring Provider (OT): Lauraine Rinne, PA-C   Encounter Date: 05/01/2020  OT End of Session - 05/01/20 1441    Visit Number  19    Number of Visits  33    Date for OT Re-Evaluation  06/22/20    Authorization Type  [employer] (follow Medicare)--1 copay for multiple visits    Authorization Time Period  cert date 2/97 - 9/89/21    Authorization - Visit Number  24    Authorization - Number of Visits  20    Progress Note Due on Visit  20    OT Start Time  1400    OT Stop Time  1445    OT Time Calculation (min)  45 min    Activity Tolerance  Patient tolerated treatment well    Behavior During Therapy  [facility] for tasks assessed/performed       Past Medical History:  Diagnosis Date  . Elevated PSA   . Hyperlipidemia    pt says not anymore  . Hypertension   . Prostate cancer ([place]) 2013    Past Surgical History:  Procedure Laterality Date  . bilateral inguinal hernia  2008  . BUBBLE STUDY  01/28/2020   Procedure: BUBBLE STUDY;  Surgeon: [name], MD;  Location: [facility] ENDOSCOPY;  Service: Cardiovascular;;  . CYST REMOVAL TRUNK  2016   sebaceous cyst  . CYSTOSCOPY  04/22/2016   Procedure: CYSTOSCOPY;  Surgeon: [name], MD;  Location: [facility];  Service: Urology;;  . IR ANGIO VERTEBRAL SEL SUBCLAVIAN INNOMINATE UNI R MOD SED  01/25/2020  . IR CT HEAD LTD  01/25/2020  . IR PERCUTANEOUS ART THROMBECTOMY/INFUSION INTRACRANIAL INC DIAG ANGIO  01/25/2020  . KNEE ARTHROSCOPY  2007   right  . LOOP RECORDER INSERTION N/A 01/28/2020   Procedure: LOOP RECORDER INSERTION;  Surgeon: [name], MD;  Location: [facility] LAB;  Service: Cardiovascular;  Laterality: N/A;  . PROSTATE BIOPSY   2008  . PROSTATE BIOPSY  2013  . PROSTATE BIOPSY  01/28/2016  . RADIOACTIVE SEED IMPLANT N/A 04/22/2016   Procedure: RADIOACTIVE SEED IMPLANT/BRACHYTHERAPY IMPLANT;  Surgeon: [name], MD;  Location[facility];  Service: Urology;  Laterality: N/A;  . RADIOLOGY WITH ANESTHESIA N/A 01/24/2020   Procedure: IR WITH ANESTHESIA;  Surgeon: [name], MD;  Location: [facility];  Service: Radiology;  Laterality: N/A;  . TEE WITHOUT CARDIOVERSION N/A 01/28/2020   Procedure: TRANSESOPHAGEAL ECHOCARDIOGRAM (TEE);  Surgeon: [name], MD;  Location: [facility] ENDOSCOPY;  Service: Cardiovascular;  Laterality: N/A;  . TONSILLECTOMY    . TOTAL HIP ARTHROPLASTY  2001   left  . TOTAL HIP ARTHROPLASTY Right 01/18/2018   Procedure: RIGHT TOTAL HIP ARTHROPLASTY ANTERIOR APPROACH;  Surgeon: [name], MD;  Location: [facility];  Service: Orthopedics;  Laterality: Right;    There were no vitals filed for this visit.  Subjective Assessment - 05/01/20 1408    Pertinent History  PMH:  hyperlipidemia, hypertension, prostate cancer radioactive seed implant 2017, R total hip arthroplasty, loop recorder insertion    Limitations  loop recorder    Patient Stated Goals  get my L hand and fingers working    Currently in Pain?  No/denies       Practiced mid level  functional reaching LUE to remove wooden large pegs from pegboard (largest 3 sizes) with min to mod difficulty and drops. Pt able to replaced largest size pegs into pegboard with max difficulty and mod compensations if peg placed upright on table and stabilized while grasping. Pt then reaching for different sized cones w/ min assist to stabilize cone and to release cone on table.  NMES to wrist/finger extensors x 10 minutes, Int = 12, 50 pps, 250 pw, 10 sec on/off cycle                      OT Short Term Goals - 04/22/20 1400      OT SHORT TERM GOAL #1   Title  Pt will be independent with initial HEP--check STG  03/24/20    Time  4    Period  Weeks    Status  Achieved      OT SHORT TERM GOAL #2   Title  Pt will verbalize understanding of proper positioning of LUE to prevent pain/injury and edema management techniques.    Time  4    Period  Weeks    Status  Achieved      OT SHORT TERM GOAL #3   Title  Pt will be independent with splint wear/care for improved positioning.    Time  4    Period  Weeks    Status  Achieved      OT SHORT TERM GOAL #4   Title  Pt will be able to use LUE as a stabilizer for simple ADL tasks.    Time  4    Period  Weeks    Status  On-going   04/22/20:  inconsistent     OT SHORT TERM GOAL #5   Title  Pt will demo at least 25% gross finger flex in prep for functional grasp.    Baseline  4    Time  4    Period  Weeks    Status  Achieved   except index and thumb       OT Long Term Goals - 04/24/20 1734      OT LONG TERM GOAL #1   Title  Pt will be independent with updated HEP.--check LTGs 04/23/20    Time  8    Period  Weeks    Status  On-going      OT LONG TERM GOAL #2   Title  Pt will be able to use LUE as nondominant assist for ADLs at least 50% of the time.    Time  8    Period  Weeks    Status  On-going   04/22/20:  25% of the time     OT LONG TERM GOAL #3   Title  Pt will be able to score at least 10 on box and blocks test with LUE for improved coordination for ADLs.    Time  8    Period  Weeks    Status  On-going   04/24/20:  4 blocks     OT LONG TERM GOAL #4   Title  Pt will complete clothing fasteners mod I using AE prn.    Time  8    Period  Weeks    Status  On-going   04/24/20: unable to use Lt hand for this at this time     OT LONG TERM GOAL #5   Title  Pt will demo improved attention/visual scanning to be able to perform environmental  scanning in busy enivornment with at least 95% accuracy for incr safety for community activities/driving    Baseline  03/14/20;  58% initially    Time  8    Period  Weeks    Status  On-going    04/24/20: 69% accuracy     OT LONG TERM GOAL #6   Title  Pt will demo at least 75% gross finger flex/ext for grasp/release of objects.    Time  8    Period  Weeks    Status  On-going   04/22/20:  approx 75% extension approx 50% flexion (performance inconsistent at times)           Plan - 05/01/20 1442    Clinical Impression Statement  Pt slowly progressing with LUE function and awareness. Pt with greater involvement of thumb and index finger today    Occupational performance deficits (Please refer to evaluation for details):  ADL's;IADL's;Leisure;Social Participation;Work    Marketing executive / Function / Physical Skills  ADL;Dexterity;ROM;IADL;Sensation;Mobility;Strength;FMC;Coordination;Decreased knowledge of precautions;Decreased knowledge of use of DME;Pain;UE functional use;GMC;Proprioception    Cognitive Skills  Attention;Memory;Perception;Safety Awareness    Rehab Potential  Fair    OT Frequency  2x / week    OT Duration  8 weeks    OT Treatment/Interventions  Self-care/ADL training;Moist Heat;Fluidtherapy;DME and/or AE instruction;Splinting;Therapeutic activities;Contrast Bath;Aquatic Therapy;Ultrasound;Therapeutic exercise;Cognitive remediation/compensation;Visual/perceptual remediation/compensation;Passive range of motion;Functional Mobility Training;Neuromuscular education;Cryotherapy;Electrical Stimulation;Paraffin;Energy conservation;Manual Therapy;Patient/family education    Plan  10th progress note, continue NMR and functional reach LUE, attention to Lt side, estim    Consulted and Agree with Plan of Care  Patient       Patient will benefit from skilled therapeutic intervention in order to improve the following deficits and impairments:   Body Structure / Function / Physical Skills: ADL, Dexterity, ROM, IADL, Sensation, Mobility, Strength, FMC, Coordination, Decreased knowledge of precautions, Decreased knowledge of use of DME, Pain, UE functional use, GMC,  Proprioception Cognitive Skills: Attention, Memory, Perception, Safety Awareness     Visit Diagnosis: Hemiplegia and hemiparesis following cerebral infarction affecting left non-dominant side (HCC)  Other symptoms and signs involving the nervous system  Attention and concentration deficit  Other lack of coordination  Muscle weakness (generalized)  Neurologic neglect syndrome    Problem List Patient Active Problem List   Diagnosis Date Noted  . Left hemiparesis ([place]) 03/28/2020  . Chronic bilateral low back pain without sciatica 02/25/2020  . Spasticity as late effect of cerebrovascular accident (CVA) 02/25/2020  . Cognitive and neurobehavioral dysfunction   . Hypertensive urgency 01/28/2020  . Urinary retention 01/28/2020  . Right middle cerebral artery stroke ([place]) 01/28/2020  . Stroke (cerebrum) ([place]) -  [name] 01/25/2020  . Acute respiratory failure with hypoxia ([place])   . Hypotension   . Hypokalemia   . Hypocalcemia   . Encephalopathy acute   . Stroke ([place]) 01/24/2020  . Middle cerebral artery embolism, right 01/24/2020  . Hyperlipidemia 02/21/2019  . Impingement syndrome of right shoulder region 02/21/2018  . OA (osteoarthritis) of hip 01/18/2018  . Prostate cancer ([place]) 03/04/2016  . Essential hypertension 11/27/2014  . History of colonic polyps 09/26/2014  . BACK PAIN, LEFT 12/22/2009  . PROSTATE SPECIFIC ANTIGEN, ELEVATED 12/20/2008  . LATERAL EPICONDYLITIS 07/15/2008  . ACTINIC KERATOSIS, FOREHEAD, LEFT 05/23/2008  . WART, LEFT HAND 07/19/2007  . ERECTILE DYSFUNCTION, MILD 07/19/2007  . HERNIA, BILATERAL INGUINAL W/O OBST/GANGRENE 07/19/2007  .  MENISCUS TEAR 07/19/2007  . Bilateral inguinal hernia 07/19/2007    Ruben Chavez, OTR/L 05/01/2020, 2:53 PM  [facility] [address]  [place], [place], [address] Phone: [phone]   Fax:  [phone]  Name: Ruben Chavez MRN: [id number] Date of Birth: 1951-05-08

## 2020-05-02 ENCOUNTER — Other Ambulatory Visit: Payer: Self-pay

## 2020-05-02 NOTE — Patient Outreach (Signed)
Telephone outreach to patient to obtain mRS was successfully completed. MRS=1  Ruben Chavez [facility] Management Assistant [phone] 

## 2020-05-02 NOTE — Patient Outreach (Signed)
First telephone outreach attempt to obtain mRS. No answer. Left message for returned call.  Ruben Chavez [facility] Management Assistant [phone]

## 2020-05-04 ENCOUNTER — Other Ambulatory Visit: Payer: Self-pay | Admitting: Internal Medicine

## 2020-05-05 LAB — CUP PACEART REMOTE DEVICE CHECK
Date Time Interrogation Session: 20210529091739
Implantable Pulse Generator Implant Date: 20210222

## 2020-05-06 ENCOUNTER — Other Ambulatory Visit: Payer: Self-pay

## 2020-05-06 ENCOUNTER — Ambulatory Visit: Payer: PPO | Attending: Physician Assistant | Admitting: Occupational Therapy

## 2020-05-06 ENCOUNTER — Ambulatory Visit (INDEPENDENT_AMBULATORY_CARE_PROVIDER_SITE_OTHER): Payer: PPO | Admitting: *Deleted

## 2020-05-06 DIAGNOSIS — R29818 Other symptoms and signs involving the nervous system: Secondary | ICD-10-CM | POA: Diagnosis not present

## 2020-05-06 DIAGNOSIS — I69354 Hemiplegia and hemiparesis following cerebral infarction affecting left non-dominant side: Secondary | ICD-10-CM | POA: Insufficient documentation

## 2020-05-06 DIAGNOSIS — I63411 Cerebral infarction due to embolism of right middle cerebral artery: Secondary | ICD-10-CM

## 2020-05-06 DIAGNOSIS — R278 Other lack of coordination: Secondary | ICD-10-CM | POA: Diagnosis not present

## 2020-05-06 DIAGNOSIS — R41840 Attention and concentration deficit: Secondary | ICD-10-CM | POA: Diagnosis not present

## 2020-05-06 NOTE — Progress Notes (Signed)
Carelink Summary Report / Loop Recorder 

## 2020-05-06 NOTE — Therapy (Signed)
[facility] [address] [place], [place], [address] Phone: [phone]   Fax:  [phone]  Occupational Therapy Treatment  Patient Details  Name: Ruben Chavez MRN: [id number] Date of Birth: [birth year] Referring Provider (OT): Lauraine Rinne, PA-C   Encounter Date: 05/06/2020  OT End of Session - 05/06/20 0944    Visit Number  20    Number of Visits  33    Date for OT Re-Evaluation  06/22/20    Authorization Type  [employer] (follow Medicare)--1 copay for multiple visits    Authorization Time Period  cert date 2/35 - 3/61/44    Authorization - Visit Number  32    Authorization - Number of Visits  30    Progress Note Due on Visit  60    OT Start Time  0940   Pt arrived late   OT Stop Time  1015    OT Time Calculation (min)  35 min    Activity Tolerance  Patient tolerated treatment well    Behavior During Therapy  [facility] for tasks assessed/performed       Past Medical History:  Diagnosis Date  . Elevated PSA   . Hyperlipidemia    pt says not anymore  . Hypertension   . Prostate cancer ([place]) 2013    Past Surgical History:  Procedure Laterality Date  . bilateral inguinal hernia  2008  . BUBBLE STUDY  01/28/2020   Procedure: BUBBLE STUDY;  Surgeon: [name], MD;  Location: [facility] ENDOSCOPY;  Service: Cardiovascular;;  . CYST REMOVAL TRUNK  2016   sebaceous cyst  . CYSTOSCOPY  04/22/2016   Procedure: CYSTOSCOPY;  Surgeon: [name], MD;  Location: [facility];  Service: Urology;;  . IR ANGIO VERTEBRAL SEL SUBCLAVIAN INNOMINATE UNI R MOD SED  01/25/2020  . IR CT HEAD LTD  01/25/2020  . IR PERCUTANEOUS ART THROMBECTOMY/INFUSION INTRACRANIAL INC DIAG ANGIO  01/25/2020  . KNEE ARTHROSCOPY  2007   right  . LOOP RECORDER INSERTION N/A 01/28/2020   Procedure: LOOP RECORDER INSERTION;  Surgeon: [name], MD;  Location: [place] [facility] CV LAB;  Service: Cardiovascular;  Laterality: N/A;  .  PROSTATE BIOPSY  2008  . PROSTATE BIOPSY  2013  . PROSTATE BIOPSY  01/28/2016  . RADIOACTIVE SEED IMPLANT N/A 04/22/2016   Procedure: RADIOACTIVE SEED IMPLANT/BRACHYTHERAPY IMPLANT;  Surgeon: [name], MD;  Location: [facility];  Service: Urology;  Laterality: N/A;  . RADIOLOGY WITH ANESTHESIA N/A 01/24/2020   Procedure: IR WITH ANESTHESIA;  Surgeon: [name], MD;  Location: [place];  Service: Radiology;  Laterality: N/A;  . TEE WITHOUT CARDIOVERSION N/A 01/28/2020   Procedure: TRANSESOPHAGEAL ECHOCARDIOGRAM (TEE);  Surgeon: [name], MD;  Location: [facility] ENDOSCOPY;  Service: Cardiovascular;  Laterality: N/A;  . TONSILLECTOMY    . TOTAL HIP ARTHROPLASTY  2001   left  . TOTAL HIP ARTHROPLASTY Right 01/18/2018   Procedure: RIGHT TOTAL HIP ARTHROPLASTY ANTERIOR APPROACH;  Surgeon: [name], MD;  Location: [facility];  Service: Orthopedics;  Laterality: Right;    There were no vitals filed for this visit.  Subjective Assessment - 05/06/20 0943    Pertinent History  PMH:  hyperlipidemia, hypertension, prostate cancer radioactive seed implant 2017, R total hip arthroplasty, loop recorder insertion    Limitations  loop recorder    Patient Stated Goals  get my L hand and fingers working    Currently in Pain?  No/denies  Pt brought in theraband to ask about using for LUE and reports already using for bicep curls. Therapist explained that he should d/c this because of lack of proper movement against gravity and lack of awareness/proper positioning as well as lack of sufficient strength for this.  Pt practiced picking up 1" blocks Lt hand and focusing on active release (vs. Just letting it drop into gravity) to place in bowl for low to mid level reaching. Pt required cueing for sh positioning as well.  Pt performing closed chain high level reaching LUE using UE Ranger w/ mod cueing for proper positioning and control. Pt also performing slides along door frame for  sh flexion and elbow ext w/ neutral forearm rotation with min assist required.                        OT Short Term Goals - 04/22/20 1400      OT SHORT TERM GOAL #1   Title  Pt will be independent with initial HEP--check STG 03/24/20    Time  4    Period  Weeks    Status  Achieved      OT SHORT TERM GOAL #2   Title  Pt will verbalize understanding of proper positioning of LUE to prevent pain/injury and edema management techniques.    Time  4    Period  Weeks    Status  Achieved      OT SHORT TERM GOAL #3   Title  Pt will be independent with splint wear/care for improved positioning.    Time  4    Period  Weeks    Status  Achieved      OT SHORT TERM GOAL #4   Title  Pt will be able to use LUE as a stabilizer for simple ADL tasks.    Time  4    Period  Weeks    Status  On-going   04/22/20:  inconsistent     OT SHORT TERM GOAL #5   Title  Pt will demo at least 25% gross finger flex in prep for functional grasp.    Baseline  4    Time  4    Period  Weeks    Status  Achieved   except index and thumb       OT Long Term Goals - 04/24/20 1734      OT LONG TERM GOAL #1   Title  Pt will be independent with updated HEP.--check LTGs 04/23/20    Time  8    Period  Weeks    Status  On-going      OT LONG TERM GOAL #2   Title  Pt will be able to use LUE as nondominant assist for ADLs at least 50% of the time.    Time  8    Period  Weeks    Status  On-going   04/22/20:  25% of the time     OT LONG TERM GOAL #3   Title  Pt will be able to score at least 10 on box and blocks test with LUE for improved coordination for ADLs.    Time  8    Period  Weeks    Status  On-going   04/24/20:  4 blocks     OT LONG TERM GOAL #4   Title  Pt will complete clothing fasteners mod I using AE prn.    Time  8    Period  Weeks    Status  On-going   04/24/20: unable to use Lt hand for this at this time     OT LONG TERM GOAL #5   Title  Pt will demo improved  attention/visual scanning to be able to perform environmental scanning in busy enivornment with at least 95% accuracy for incr safety for community activities/driving    Baseline  03/14/20;  58% initially    Time  8    Period  Weeks    Status  On-going   04/24/20: 69% accuracy     OT LONG TERM GOAL #6   Title  Pt will demo at least 75% gross finger flex/ext for grasp/release of objects.    Time  8    Period  Weeks    Status  On-going   04/22/20:  approx 75% extension approx 50% flexion (performance inconsistent at times)           Plan - 05/06/20 1231    Clinical Impression Statement  This 10th progress note is for 04/03/20 - 05/06/20: Pt has improved in LUE ROM at wrist and fingers however pt continues to demo decreased attention and awareness to Lt side of body and still requires cues for proper positioning and judgement w/ safe activities for LUE.    Occupational performance deficits (Please refer to evaluation for details):  ADL's;IADL's;Leisure;Social Participation;Work    Marketing executive / Function / Physical Skills  ADL;Dexterity;ROM;IADL;Sensation;Mobility;Strength;FMC;Coordination;Decreased knowledge of precautions;Decreased knowledge of use of DME;Pain;UE functional use;GMC;Proprioception    Cognitive Skills  Attention;Memory;Perception;Safety Awareness    OT Frequency  2x / week    OT Duration  8 weeks    OT Treatment/Interventions  Self-care/ADL training;Moist Heat;Fluidtherapy;DME and/or AE instruction;Splinting;Therapeutic activities;Contrast Bath;Aquatic Therapy;Ultrasound;Therapeutic exercise;Cognitive remediation/compensation;Visual/perceptual remediation/compensation;Passive range of motion;Functional Mobility Training;Neuromuscular education;Cryotherapy;Electrical Stimulation;Paraffin;Energy conservation;Manual Therapy;Patient/family education    Plan  continue NMR and functional reach LUE, attention to Lt side, estim    Consulted and Agree with Plan of Care  Patient        Patient will benefit from skilled therapeutic intervention in order to improve the following deficits and impairments:   Body Structure / Function / Physical Skills: ADL, Dexterity, ROM, IADL, Sensation, Mobility, Strength, FMC, Coordination, Decreased knowledge of precautions, Decreased knowledge of use of DME, Pain, UE functional use, GMC, Proprioception Cognitive Skills: Attention, Memory, Perception, Safety Awareness     Visit Diagnosis: Hemiplegia and hemiparesis following cerebral infarction affecting left non-dominant side (HCC)  Other symptoms and signs involving the nervous system  Attention and concentration deficit  Other lack of coordination    Problem List Patient Active Problem List   Diagnosis Date Noted  . Left hemiparesis ([place]) 03/28/2020  . Chronic bilateral low back pain without sciatica 02/25/2020  . Spasticity as late effect of cerebrovascular accident (CVA) 02/25/2020  . Cognitive and neurobehavioral dysfunction   . Hypertensive urgency 01/28/2020  . Urinary retention 01/28/2020  . Right middle cerebral artery stroke ([place]) 01/28/2020  . Stroke (cerebrum) ([place]) -  [name] 01/25/2020  . Acute respiratory failure with hypoxia ([place])   . Hypotension   . Hypokalemia   . Hypocalcemia   . Encephalopathy acute   . Stroke ([place]) 01/24/2020  . Middle cerebral artery embolism, right 01/24/2020  . Hyperlipidemia 02/21/2019  . Impingement syndrome of right shoulder region 02/21/2018  . OA (osteoarthritis) of hip 01/18/2018  . Prostate cancer ([place]) 03/04/2016  .  Essential hypertension 11/27/2014  . History of colonic polyps 09/26/2014  . BACK PAIN, LEFT 12/22/2009  . PROSTATE SPECIFIC ANTIGEN, ELEVATED 12/20/2008  . LATERAL EPICONDYLITIS 07/15/2008  . ACTINIC KERATOSIS, FOREHEAD, LEFT 05/23/2008  . WART, LEFT HAND 07/19/2007  . ERECTILE DYSFUNCTION,  MILD 07/19/2007  . HERNIA, BILATERAL INGUINAL W/O OBST/GANGRENE 07/19/2007  . MENISCUS TEAR 07/19/2007  . Bilateral inguinal hernia 07/19/2007    Carey Bullocks, OTR/L 05/06/2020, 12:34 PM  [facility] [address] [place], [place], [address] Phone: [phone]   Fax:  [phone]  Name: ZYION DOXTATER MRN: [id number] Date of Birth: [birth year]

## 2020-05-07 ENCOUNTER — Ambulatory Visit: Payer: PPO | Admitting: Occupational Therapy

## 2020-05-07 DIAGNOSIS — I69354 Hemiplegia and hemiparesis following cerebral infarction affecting left non-dominant side: Secondary | ICD-10-CM

## 2020-05-07 DIAGNOSIS — R278 Other lack of coordination: Secondary | ICD-10-CM

## 2020-05-07 DIAGNOSIS — R41840 Attention and concentration deficit: Secondary | ICD-10-CM

## 2020-05-07 DIAGNOSIS — R29818 Other symptoms and signs involving the nervous system: Secondary | ICD-10-CM

## 2020-05-07 NOTE — Therapy (Signed)
[facility] [address] [place], [place], [address] Phone: [phone]   Fax:  [phone]  Occupational Therapy Treatment  Patient Details  Name: Ruben Chavez MRN: [id number] Date of Birth: June 24, 1951 Referring Provider (OT): Lauraine Rinne, PA-C   Encounter Date: 05/07/2020  OT End of Session - 05/07/20 1012    Visit Number  21    Number of Visits  33    Date for OT Re-Evaluation  06/22/20    Authorization Type  [employer] (follow Medicare)--1 copay for multiple visits    Authorization Time Period  cert date 6/43 - 03/03/50    Authorization - Visit Number  21    Authorization - Number of Visits  30    Progress Note Due on Visit  30    OT Start Time  0935    OT Stop Time  1018    OT Time Calculation (min)  43 min    Activity Tolerance  Patient tolerated treatment well    Behavior During Therapy  [facility] for tasks assessed/performed       Past Medical History:  Diagnosis Date  . Elevated PSA   . Hyperlipidemia    pt says not anymore  . Hypertension   . Prostate cancer ([place]) 2013    Past Surgical History:  Procedure Laterality Date  . bilateral inguinal hernia  2008  . BUBBLE STUDY  01/28/2020   Procedure: BUBBLE STUDY;  Surgeon: [name], MD;  Location: [facility] ENDOSCOPY;  Service: Cardiovascular;;  . CYST REMOVAL TRUNK  2016   sebaceous cyst  . CYSTOSCOPY  04/22/2016   Procedure: CYSTOSCOPY;  Surgeon: [name], MD;  Location: [facility];  Service: Urology;;  . IR ANGIO VERTEBRAL SEL SUBCLAVIAN INNOMINATE UNI R MOD SED  01/25/2020  . IR CT HEAD LTD  01/25/2020  . IR PERCUTANEOUS ART THROMBECTOMY/INFUSION INTRACRANIAL INC DIAG ANGIO  01/25/2020  . KNEE ARTHROSCOPY  2007   right  . LOOP RECORDER INSERTION N/A 01/28/2020   Procedure: LOOP RECORDER INSERTION;  Surgeon: [name], MD;  Location: [facility] CV LAB;  Service: Cardiovascular;  Laterality: N/A;  . PROSTATE BIOPSY   2008  . PROSTATE BIOPSY  2013  . PROSTATE BIOPSY  01/28/2016  . RADIOACTIVE SEED IMPLANT N/A 04/22/2016   Procedure: RADIOACTIVE SEED IMPLANT/BRACHYTHERAPY IMPLANT;  Surgeon: [name], MD;  Location: [facility];  Service: Urology;  Laterality: N/A;  . RADIOLOGY WITH ANESTHESIA N/A 01/24/2020   Procedure: IR WITH ANESTHESIA;  Surgeon: [name], MD;  Location: [facility];  Service: Radiology;  Laterality: N/A;  . TEE WITHOUT CARDIOVERSION N/A 01/28/2020   Procedure: TRANSESOPHAGEAL ECHOCARDIOGRAM (TEE);  Surgeon: [name], MD;  Location: [facility] ENDOSCOPY;  Service: Cardiovascular;  Laterality: N/A;  . TONSILLECTOMY    . TOTAL HIP ARTHROPLASTY  2001   left  . TOTAL HIP ARTHROPLASTY Right 01/18/2018   Procedure: RIGHT TOTAL HIP ARTHROPLASTY ANTERIOR APPROACH;  Surgeon: [name], MD;  Location: [facility];  Service: Orthopedics;  Laterality: Right;    There were no vitals filed for this visit.  Subjective Assessment - 05/07/20 0937    Subjective   I got one of my O.T. appts rescheduled b/c of the driving evaluation    Pertinent History  PMH:  hyperlipidemia, hypertension, prostate cancer radioactive seed implant 2017, R total hip arthroplasty, loop recorder insertion    Limitations  loop recorder    Patient Stated Goals  get my L hand and  fingers working    Currently in Pain?  No/denies      Functional low to mid level reaching w/ min to mod facilitation to pick up blocks, wooden dowels, and cup (min assist for cup, and modifications needed for wooden dowels) w/ cueing to id and utilize correct muscle groups including wrist ext and RD, forearm rotation (dependent on task) and to prevent compensation proximally at elbow and shoulder.  Pt unable to pick up checkers.                        OT Short Term Goals - 04/22/20 1400      OT SHORT TERM GOAL #1   Title  Pt will be independent with initial HEP--check STG 03/24/20    Time  4    Period   Weeks    Status  Achieved      OT SHORT TERM GOAL #2   Title  Pt will verbalize understanding of proper positioning of LUE to prevent pain/injury and edema management techniques.    Time  4    Period  Weeks    Status  Achieved      OT SHORT TERM GOAL #3   Title  Pt will be independent with splint wear/care for improved positioning.    Time  4    Period  Weeks    Status  Achieved      OT SHORT TERM GOAL #4   Title  Pt will be able to use LUE as a stabilizer for simple ADL tasks.    Time  4    Period  Weeks    Status  On-going   04/22/20:  inconsistent     OT SHORT TERM GOAL #5   Title  Pt will demo at least 25% gross finger flex in prep for functional grasp.    Baseline  4    Time  4    Period  Weeks    Status  Achieved   except index and thumb       OT Long Term Goals - 04/24/20 1734      OT LONG TERM GOAL #1   Title  Pt will be independent with updated HEP.--check LTGs 04/23/20    Time  8    Period  Weeks    Status  On-going      OT LONG TERM GOAL #2   Title  Pt will be able to use LUE as nondominant assist for ADLs at least 50% of the time.    Time  8    Period  Weeks    Status  On-going   04/22/20:  25% of the time     OT LONG TERM GOAL #3   Title  Pt will be able to score at least 10 on box and blocks test with LUE for improved coordination for ADLs.    Time  8    Period  Weeks    Status  On-going   04/24/20:  4 blocks     OT LONG TERM GOAL #4   Title  Pt will complete clothing fasteners mod I using AE prn.    Time  8    Period  Weeks    Status  On-going   04/24/20: unable to use Lt hand for this at this time     OT LONG TERM GOAL #5   Title  Pt will demo improved attention/visual scanning to be able to perform environmental scanning in  busy enivornment with at least 95% accuracy for incr safety for community activities/driving    Baseline  03/14/20;  58% initially    Time  8    Period  Weeks    Status  On-going   04/24/20: 69% accuracy     OT LONG  TERM GOAL #6   Title  Pt will demo at least 75% gross finger flex/ext for grasp/release of objects.    Time  8    Period  Weeks    Status  On-going   04/22/20:  approx 75% extension approx 50% flexion (performance inconsistent at times)           Plan - 05/07/20 1027    Clinical Impression Statement  Pt continues to make slow progress LUE w/ cues needed to attend to arm correctly and to id which movements are needed for task.    Occupational performance deficits (Please refer to evaluation for details):  ADL's;IADL's;Leisure;Social Participation;Work    Marketing executive / Function / Physical Skills  ADL;Dexterity;ROM;IADL;Sensation;Mobility;Strength;FMC;Coordination;Decreased knowledge of precautions;Decreased knowledge of use of DME;Pain;UE functional use;GMC;Proprioception    Cognitive Skills  Attention;Memory;Perception;Safety Awareness    OT Frequency  2x / week    OT Duration  8 weeks    OT Treatment/Interventions  Self-care/ADL training;Moist Heat;Fluidtherapy;DME and/or AE instruction;Splinting;Therapeutic activities;Contrast Bath;Aquatic Therapy;Ultrasound;Therapeutic exercise;Cognitive remediation/compensation;Visual/perceptual remediation/compensation;Passive range of motion;Functional Mobility Training;Neuromuscular education;Cryotherapy;Electrical Stimulation;Paraffin;Energy conservation;Manual Therapy;Patient/family education    Plan  continue NMR and functional reach LUE, attention to Lt side, estim    Consulted and Agree with Plan of Care  Patient       Patient will benefit from skilled therapeutic intervention in order to improve the following deficits and impairments:   Body Structure / Function / Physical Skills: ADL, Dexterity, ROM, IADL, Sensation, Mobility, Strength, FMC, Coordination, Decreased knowledge of precautions, Decreased knowledge of use of DME, Pain, UE functional use, GMC, Proprioception Cognitive Skills: Attention, Memory, Perception, Safety Awareness      Visit Diagnosis: Hemiplegia and hemiparesis following cerebral infarction affecting left non-dominant side (HCC)  Other symptoms and signs involving the nervous system  Attention and concentration deficit  Other lack of coordination    Problem List Patient Active Problem List   Diagnosis Date Noted  . Left hemiparesis ([place]) 03/28/2020  . Chronic bilateral low back pain without sciatica 02/25/2020  . Spasticity as late effect of cerebrovascular accident (CVA) 02/25/2020  . Cognitive and neurobehavioral dysfunction   . Hypertensive urgency 01/28/2020  . Urinary retention 01/28/2020  . Right middle cerebral artery stroke ([place]) 01/28/2020  . Stroke (cerebrum) ([place]) -  [name] 01/25/2020  . Acute respiratory failure with hypoxia ([place])   . Hypotension   . Hypokalemia   . Hypocalcemia   . Encephalopathy acute   . Stroke ([place]) 01/24/2020  . Middle cerebral artery embolism, right 01/24/2020  . Hyperlipidemia 02/21/2019  . Impingement syndrome of right shoulder region 02/21/2018  . OA (osteoarthritis) of hip 01/18/2018  . Prostate cancer ([place]) 03/04/2016  . Essential hypertension 11/27/2014  . History of colonic polyps 09/26/2014  . BACK PAIN, LEFT 12/22/2009  . PROSTATE SPECIFIC ANTIGEN, ELEVATED 12/20/2008  . LATERAL EPICONDYLITIS 07/15/2008  . ACTINIC KERATOSIS, FOREHEAD, LEFT 05/23/2008  . WART, LEFT HAND 07/19/2007  . ERECTILE DYSFUNCTION, MILD 07/19/2007  . HERNIA, BILATERAL INGUINAL W/O OBST/GANGRENE 07/19/2007  . MENISCUS TEAR 07/19/2007  . Bilateral inguinal hernia 07/19/2007    Emmit Pomfret,  Barkley Boards, OTR/L 05/07/2020, 10:29 AM  [facility] [address] [place], [place], [address] Phone: [phone]   Fax:  [phone]  Name: HARON BEILKE MRN: [id number] Date of Birth: 12-May-1951

## 2020-05-08 ENCOUNTER — Ambulatory Visit (HOSPITAL_COMMUNITY)
Admission: RE | Admit: 2020-05-08 | Discharge: 2020-05-08 | Disposition: A | Discharge status: Home / Self Care | Payer: PPO | Source: Ambulatory Visit | Attending: Interventional Radiology | Admitting: Interventional Radiology

## 2020-05-08 ENCOUNTER — Other Ambulatory Visit: Payer: Self-pay

## 2020-05-08 DIAGNOSIS — I6522 Occlusion and stenosis of left carotid artery: Secondary | ICD-10-CM | POA: Diagnosis not present

## 2020-05-08 DIAGNOSIS — I639 Cerebral infarction, unspecified: Secondary | ICD-10-CM | POA: Diagnosis not present

## 2020-05-08 DIAGNOSIS — I6501 Occlusion and stenosis of right vertebral artery: Secondary | ICD-10-CM | POA: Diagnosis not present

## 2020-05-08 LAB — POCT I-STAT CREATININE: Creatinine, Ser: 1 mg/dL (ref 0.61–1.24)

## 2020-05-08 MED ORDER — IOHEXOL 350 MG/ML SOLN
100.0000 mL | Freq: Once | INTRAVENOUS | Status: AC | PRN
  Administered 2020-05-08: 100 mL via INTRAVENOUS

## 2020-05-13 ENCOUNTER — Telehealth (HOSPITAL_COMMUNITY): Payer: Self-pay

## 2020-05-13 ENCOUNTER — Telehealth: Payer: Self-pay | Admitting: *Deleted

## 2020-05-13 ENCOUNTER — Ambulatory Visit: Payer: PPO | Admitting: Adult Health

## 2020-05-13 ENCOUNTER — Encounter: Payer: Self-pay | Admitting: Adult Health

## 2020-05-13 VITALS — BP 116/63 | HR 67 | Ht 70.0 in | Wt 170.0 lb

## 2020-05-13 DIAGNOSIS — E785 Hyperlipidemia, unspecified: Secondary | ICD-10-CM | POA: Diagnosis not present

## 2020-05-13 DIAGNOSIS — I639 Cerebral infarction, unspecified: Secondary | ICD-10-CM

## 2020-05-13 DIAGNOSIS — I10 Essential (primary) hypertension: Secondary | ICD-10-CM | POA: Diagnosis not present

## 2020-05-13 NOTE — Telephone Encounter (Signed)
Called to schedule diagnostic angiogram, no answer, left vm. AW  

## 2020-05-13 NOTE — Telephone Encounter (Signed)
LMVM for pt this am about appt scheduled this am at 1045, he was seen 03-19-20 and to be seen 3 month mid July.  Made earlier appt due to driving issue.  Asked for call back.

## 2020-05-13 NOTE — Progress Notes (Signed)
[facility] [address] [place]. [place] [address] [phone]       STROKE FOLLOW UP NOTE  Ruben Chavez Date of Birth:  January 25, 1951 Medical Record Number:  [id number]   Reason for Referral: stroke follow up    SUBJECTIVE:   CHIEF COMPLAINT:  Chief Complaint  Patient presents with  . Follow-up    Rm 9 here for a stroke f/u. Pt is having no new sx    HPI:   Today, 05/13/2020, Ruben Chavez is being seen for follow-up regarding right MCA stroke in 01/2020.  Residual deficits of LUE weakness, mild dysarthria and cognitive impairment with short term memory concern. Continues to work with therapies with ongoing improvement.  Recent referral placed for driving evaluation as there was concern from current therapist regarding return to driving in regards to safety with altered awareness and cognition. Scheduled on June 23rd.  Completed 3 months DAPT and continues on aspirin alone as well as continuation of atorvastatin.  Blood pressure today 116/63.  Loop recorder is not shown atrial fibrillation thus far.  No concerns at this time.   History provided for reference purposes only Initial visit 03/19/2020, [name] is being seen for hospital follow-up.  Residual deficits of LUE weakness, left facial droop, dysarthria and cognitive communication deficit.  He is currently being followed by PT/OT/ST for ongoing therapy needs.  He has not been able to return to driving at this time and feels as though this is due to decreased function of left hand.  Per review of ST note, suspicion for mild to moderate cognitive communication deficits with reduced awareness/insight of deficits.  He has continued on DAPT without bleeding or bruising.  Atorvastatin 20 mg daily initiated for stroke prevention and denies myalgias.  Blood pressure stable at 128/72.  Loop recorder has not shown atrial fibrillation thus far.  Continues to follow with PCP for HTN management.  No further concerns  at this time.  Stroke admission 01/24/2020: RubenAmauri P [name] a 69 y.o.malewith history of HTN, HLD, prostate cancer who presented on 01/24/2020 with L sided weakness, L hemisensory loss, L facial droop, L field cut, dysarthria and anosognosia.  Evaluated by stroke team with stroke work-up revealing right MCA infarct due to right MCA occlusion s/p TPA and IR with TICI 2B recanalization -embolic secondary to unknown source.  Loop recorder placed to rule out atrial fibrillation.  Recommended DAPT for 3 months then aspirin alone.  Hypertensive urgency upon arrival with BP 180/120 stabilized during admission and resumed lisinopril 20 mg daily.  No history of DM or HLD.  No prior history of stroke.  Other active problems include history of prostate cancer, hypokalemia, hypocalcemia, leukocytosis and urinary retention.  Residual deficits of mild leftfacialdroop, left hemiparesis and decreased sensation and discharged to CIR for ongoing therapy needs.  Stroke: [name]  CT head subacute R lateral temporal lobe and temporoparietal area infarcts.  CTA head &neck R M2 superior division occlusion.B ICA bifurcation atherosclerosis.   CT perfusion pseudonormalization R temporal lobe. R frontoparietal jxn infarct 46cc, R MCA 72cc.  Cerebral angio TICI2b revascularization R MCA  MRI -Patchy and scattered acute right MCA territory infarct.Petechial blood in the right temporal lobe.  2D Echo- EF 60 - 65%. No cardiac source of emboli identified.   LE venous Doppler - negative for DVT bilaterally  TEEneg PFO or other SOE  loop placement2/22([name])   LDL54 - no statin needed given goal LDL  HgbA1c 5.0  UDS pending  Lovenox for VTE prophylaxis  No antithromboticprior to admission, now on ASA 325 mg and Plavix 75 daily. Continue DAPT for 3 months and then ASA  alone.  Therapy recommendations: CIR  Disposition: CIR      ROS:   14 system review of systems performed and negative with exception of weakness, speech difficulty, short-term memory loss  PMH:  Past Medical History:  Diagnosis Date  . Elevated PSA   . Hyperlipidemia    pt says not anymore  . Hypertension   . Prostate cancer ([place]) 2013    PSH:  Past Surgical History:  Procedure Laterality Date  . bilateral inguinal hernia  2008  . BUBBLE STUDY  01/28/2020   Procedure: BUBBLE STUDY;  Surgeon: [name], MD;  Location: [facility] ENDOSCOPY;  Service: Cardiovascular;;  . CYST REMOVAL TRUNK  2016   sebaceous cyst  . CYSTOSCOPY  04/22/2016   Procedure: CYSTOSCOPY;  Surgeon: [name], MD;  Location: [facility];  Service: Urology;;  . IR ANGIO VERTEBRAL SEL SUBCLAVIAN INNOMINATE UNI R MOD SED  01/25/2020  . IR CT HEAD LTD  01/25/2020  . IR PERCUTANEOUS ART THROMBECTOMY/INFUSION INTRACRANIAL INC DIAG ANGIO  01/25/2020  . KNEE ARTHROSCOPY  2007   right  . LOOP RECORDER INSERTION N/A 01/28/2020   Procedure: LOOP RECORDER INSERTION;  Surgeon: [name], MD;  Location: [place] CV LAB;  Service: Cardiovascular;  Laterality: N/A;  . PROSTATE BIOPSY  2008  . PROSTATE BIOPSY  2013  . PROSTATE BIOPSY  01/28/2016  . RADIOACTIVE SEED IMPLANT N/A 04/22/2016   Procedure: RADIOACTIVE SEED IMPLANT/BRACHYTHERAPY IMPLANT;  Surgeon: [name], MD;  Location: [facility];  Service: Urology;  Laterality: N/A;  . RADIOLOGY WITH ANESTHESIA N/A 01/24/2020   Procedure: IR WITH ANESTHESIA;  Surgeon: [name], MD;  Location: [place];  Service: Radiology;  Laterality: N/A;  . TEE WITHOUT CARDIOVERSION N/A 01/28/2020   Procedure: TRANSESOPHAGEAL ECHOCARDIOGRAM (TEE);  Surgeon: [name], MD;  Location: [facility] ENDOSCOPY;  Service: Cardiovascular;  Laterality: N/A;  . TONSILLECTOMY    . TOTAL HIP ARTHROPLASTY  2001   left  . TOTAL HIP  ARTHROPLASTY Right 01/18/2018   Procedure: RIGHT TOTAL HIP ARTHROPLASTY ANTERIOR APPROACH;  Surgeon: [name], MD;  Location: [facility];  Service: Orthopedics;  Laterality: Right;    Social History:  Social History   Socioeconomic History  . Marital status: Married    Spouse name: Not on file  . Number of children: Not on file  . Years of education: Not on file  . Highest education level: Not on file  Occupational History  . Not on file  Tobacco Use  . Smoking status: Former Smoker    Packs/day: 1.00    Years: 32.00    Pack years: 32.00    Types: Cigarettes    Quit date: 06/06/1999    Years since quitting: 20.9  . Smokeless tobacco: Never Used  Substance and Sexual Activity  . Alcohol use: Yes    Alcohol/week: 14.0 standard drinks    Types: 14 Shots of liquor per week  . Drug use: No  . Sexual activity: Yes  Other Topics Concern  . Not on file  Social History Narrative  . Not on file   Social Determinants of Health   Financial Resource Strain:   . Difficulty of Paying Living Expenses:   Food  Insecurity:   . Worried About Charity fundraiser in the Last Year:   . Arboriculturist in the Last Year:   Transportation Needs:   . Film/video editor (Medical):   Marland Kitchen Lack of Transportation (Non-Medical):   Physical Activity:   . Days of Exercise per Week:   . Minutes of Exercise per Session:   Stress:   . Feeling of Stress :   Social Connections:   . Frequency of Communication with Friends and Family:   . Frequency of Social Gatherings with Friends and Family:   . Attends Religious Services:   . Active Member of Clubs or Organizations:   . Attends Archivist Meetings:   Marland Kitchen Marital Status:   Intimate Partner Violence:   . Fear of Current or Ex-Partner:   . Emotionally Abused:   Marland Kitchen Physically Abused:   . Sexually Abused:     Family History:  Family History  Problem Relation Age of Onset  . Pancreatic cancer Mother   . Heart disease Father   . Colon  cancer Neg Hx     Medications:   Current Outpatient Medications on File Prior to Visit  Medication Sig Dispense Refill  . acetaminophen (TYLENOL) 325 MG tablet Take 2 tablets (650 mg total) by mouth every 4 (four) hours as needed for mild pain (or temp > 37.5 C (99.5 F)).    Marland Kitchen aspirin EC 325 MG EC tablet Take 1 tablet (325 mg total) by mouth daily. 30 tablet 0  . atorvastatin (LIPITOR) 20 MG tablet TAKE 1 TABLET(20 MG) BY MOUTH DAILY AT 6 PM 90 tablet 1  . buPROPion (WELLBUTRIN) 75 MG tablet TAKE 1 TABLET(75 MG) BY MOUTH TWICE DAILY AT 10 AM AND AT 4 PM 482 tablet 1  . folic acid (FOLVITE) 1 MG tablet TAKE 1 TABLET(1 MG) BY MOUTH DAILY 90 tablet 1  . lisinopril-hydrochlorothiazide (ZESTORETIC) 20-25 MG tablet TAKE 1 TABLET BY MOUTH DAILY 90 tablet 0  . magnesium 30 MG tablet Take 250 mg by mouth 1 day or 1 dose.    . Multiple Vitamin (MULTIVITAMIN WITH MINERALS) TABS tablet Take 1 tablet by mouth daily.    . pantoprazole (PROTONIX) 40 MG tablet TAKE 1 TABLET(40 MG) BY MOUTH DAILY 90 tablet 1  . senna-docusate (SENOKOT-S) 8.6-50 MG tablet Take 2 tablets by mouth daily.    . traZODone (DESYREL) 100 MG tablet Take 100 mg by mouth at bedtime.     No current facility-administered medications on file prior to visit.    Allergies:   Allergies  Allergen Reactions  . Sulfa Antibiotics Swelling and Other (See Comments)    Swelling in ankles   . Advil [Ibuprofen] Other (See Comments)    "Dots on chest" (Petechiae)  . Aleve [Naproxen] Other (See Comments)    "Dots on chest" (Petechiae)  . Betadine [Povidone Iodine] Itching and Rash  . Chloroxylenol (Antiseptic) Itching, Rash and Other (See Comments)    PCMX surgical sterilizing scrub  . Poison Ivy Extract Rash  . Povidone-Iodine Itching and Rash    BETADINE      OBJECTIVE:  Vitals  Vitals:   05/13/20 1038  BP: 116/63  Pulse: 67  Weight: 170 lb (77.1 kg)  Height: _0  (1.778 m)   Body mass index is 24.39 kg/m. No exam data  present   Physical exam:  General: well developed, well nourished,  pleasant elderly Caucasian male, seated, in no evident distress Head: head normocephalic and atraumatic.  Neck: supple with no carotid or supraclavicular bruits Cardiovascular: regular rate and rhythm, no murmurs Musculoskeletal: no deformity Skin:  no rash/petichiae Vascular:  Normal pulses all extremities   Neurologic Exam Mental Status: Awake and fully alert. mild dysarthria. Oriented to place and time. Recent memory impaired and remote memory intact. Attention span, concentration and fund of knowledge appropriate during visit. Mood and affect appropriate.  Cranial Nerves: Pupils equal, briskly reactive to light. Extraocular movements full without nystagmus. Visual fields full to confrontation. Hearing intact. Facial sensation intact. Mild left lower facial weakness Motor: full strength right upper and lower extremity LUE: 5/5 deltoid and tricep, 4/5 bicep; 3/5 grip with increased swelling -improvement from prior visit LLE: 5/5 Sensory.:  Intact light touch, pinprick and vibratory sensation Coordination: Rapid alternating movements normal in all extremities except decreased left hand. Finger-to-nose performed accurtely on right side and heel-to-shin performed accurately bilaterally. Gait and Station: Arises from chair without difficulty. Stance is normal. Gait demonstrates normal stride length and balance Reflexes: 1+ and symmetric. Toes downgoing.        ASSESSMENT: Ruben Chavez is a 69 y.o. year old male presented with left-sided weakness, left hemisensory loss, left facial droop, left field cut, dysarthria and anosognosia on 01/24/2020 with stroke work-up revealing right MCA infarct due to right MCA occlusion s/p TPA and IR with TICI 2B recanalization -embolic secondary to unknown source therefore loop recorder placed which has not shown atrial fibrillation . Vascular risk factors include HTN, HLD and history  of EtOH use.  Residual deficit of mild dysarthria, LUE weakness and cognitive impairment     PLAN:  1. Left MCA stroke:  -Residual deficits: Ongoing participation in outpatient therapies for hopeful ongoing improvement.  Plans on undergoing driving evaluation on 05/28/2020 for clearance prior to driving -Loop recorder will continue to be monitored for possible atrial fibrillation -Continue aspirin 325 mg daily  and atorvastatin 20 mg daily for secondary stroke prevention.   -Maintain strict control of hypertension with blood pressure goal below 130/90, diabetes with hemoglobin A1c goal below 6.5% and cholesterol with LDL cholesterol (bad cholesterol) goal below 70 mg/dL.  I also advised the patient to eat a healthy diet with plenty of whole grains, cereals, fruits and vegetables, exercise regularly with at least 30 minutes of continuous activity daily and maintain ideal body weight. 2. HTN: Stable.  Continue to follow with PCP for monitoring and management 3. HLD: Ongoing use of atorvastatin 20 mg daily for stroke prevention with ongoing prescribing, monitoring management through PCP    Follow up in 6 months or call earlier if needed  I spent 22 minutes of face-to-face and non-face-to-face time with patient.  This included previsit chart review, lab review, study review, order entry, electronic health record documentation, patient education   Frann Rider, [facility]  [facility] [address] [place], [place] [address]  Phone [phone] Fax [phone] Note: This document was prepared with digital dictation and possible smart phrase technology. Any transcriptional errors that result from this process are unintentional.

## 2020-05-13 NOTE — Patient Instructions (Signed)
Continue to work with outpatient therapies for ongoing deficits  Continue aspirin 325 mg daily  and atorvastatin for secondary stroke prevention  Continue to follow up with PCP regarding cholesterol and blood pressure management   Continue to monitor loop recorder for atrial fibrillation  Continue to monitor blood pressure at home  Maintain strict control of hypertension with blood pressure goal below 130/90, diabetes with hemoglobin A1c goal below 6.5% and cholesterol with LDL cholesterol (bad cholesterol) goal below 70 mg/dL. I also advised the patient to eat a healthy diet with plenty of whole grains, cereals, fruits and vegetables, exercise regularly and maintain ideal body weight.  Followup in the future with me in 6 months or call earlier if needed       Thank you for coming to see [place] at [facility]. I hope we have been able to provide you high quality care today.  You may receive a patient satisfaction survey over the next few weeks. We would appreciate your feedback and comments so that we may continue to improve ourselves and the health of our patients.

## 2020-05-14 ENCOUNTER — Other Ambulatory Visit (HOSPITAL_COMMUNITY): Payer: Self-pay | Admitting: Interventional Radiology

## 2020-05-14 DIAGNOSIS — I639 Cerebral infarction, unspecified: Secondary | ICD-10-CM

## 2020-05-19 ENCOUNTER — Ambulatory Visit: Payer: PPO | Admitting: Occupational Therapy

## 2020-05-19 ENCOUNTER — Other Ambulatory Visit: Payer: Self-pay

## 2020-05-19 DIAGNOSIS — I69354 Hemiplegia and hemiparesis following cerebral infarction affecting left non-dominant side: Secondary | ICD-10-CM

## 2020-05-19 DIAGNOSIS — R29818 Other symptoms and signs involving the nervous system: Secondary | ICD-10-CM

## 2020-05-19 NOTE — Progress Notes (Signed)
I agree with the above plan 

## 2020-05-19 NOTE — Therapy (Signed)
[facility] [address] [place], [place], [address] Phone: [phone]   Fax:  [phone]  Occupational Therapy Treatment  Patient Details  Name: Ruben Chavez MRN: [id number] Date of Birth: Jan 25, 1951 Referring Provider (OT): Lauraine Rinne, PA-C   Encounter Date: 05/19/2020   OT End of Session - 05/19/20 1333    Visit Number 22    Number of Visits 33    Date for OT Re-Evaluation 06/22/20    Authorization Type [employer] (follow Medicare)--1 copay for multiple visits    Authorization Time Period cert date 6/76 - 06/24/93    Authorization - Visit Number 87    Authorization - Number of Visits 30    Progress Note Due on Visit 30    OT Start Time 1325    OT Stop Time 1410    OT Time Calculation (min) 45 min    Activity Tolerance Patient tolerated treatment well    Behavior During Therapy [facility] for tasks assessed/performed           Past Medical History:  Diagnosis Date  . Elevated PSA   . Hyperlipidemia    pt says not anymore  . Hypertension   . Prostate cancer ([place]) 2013    Past Surgical History:  Procedure Laterality Date  . bilateral inguinal hernia  2008  . BUBBLE STUDY  01/28/2020   Procedure: BUBBLE STUDY;  Surgeon: [name], MD;  Location: [facility] ENDOSCOPY;  Service: Cardiovascular;;  . CYST REMOVAL TRUNK  2016   sebaceous cyst  . CYSTOSCOPY  04/22/2016   Procedure: CYSTOSCOPY;  Surgeon: [name], MD;  Location: [facility];  Service: Urology;;  . IR ANGIO VERTEBRAL SEL SUBCLAVIAN INNOMINATE UNI R MOD SED  01/25/2020  . IR CT HEAD LTD  01/25/2020  . IR PERCUTANEOUS ART THROMBECTOMY/INFUSION INTRACRANIAL INC DIAG ANGIO  01/25/2020  . KNEE ARTHROSCOPY  2007   right  . LOOP RECORDER INSERTION N/A 01/28/2020   Procedure: LOOP RECORDER INSERTION;  Surgeon: [name], MD;  Location: [place] CV LAB;  Service: Cardiovascular;  Laterality: N/A;  . PROSTATE BIOPSY  2008    . PROSTATE BIOPSY  2013  . PROSTATE BIOPSY  01/28/2016  . RADIOACTIVE SEED IMPLANT N/A 04/22/2016   Procedure: RADIOACTIVE SEED IMPLANT/BRACHYTHERAPY IMPLANT;  Surgeon: [name], MD;  Location: [facility];  Service: Urology;  Laterality: N/A;  . RADIOLOGY WITH ANESTHESIA N/A 01/24/2020   Procedure: IR WITH ANESTHESIA;  Surgeon: [name], MD;  Location: [place];  Service: Radiology;  Laterality: N/A;  . TEE WITHOUT CARDIOVERSION N/A 01/28/2020   Procedure: TRANSESOPHAGEAL ECHOCARDIOGRAM (TEE);  Surgeon: [name], MD;  Location: [facility] ENDOSCOPY;  Service: Cardiovascular;  Laterality: N/A;  . TONSILLECTOMY    . TOTAL HIP ARTHROPLASTY  2001   left  . TOTAL HIP ARTHROPLASTY Right 01/18/2018   Procedure: RIGHT TOTAL HIP ARTHROPLASTY ANTERIOR APPROACH;  Surgeon: [name], MD;  Location: [facility];  Service: Orthopedics;  Laterality: Right;    There were no vitals filed for this visit.   Subjective Assessment - 05/19/20 1330    Pertinent History PMH:  hyperlipidemia, hypertension, prostate cancer radioactive seed implant 2017, R total hip arthroplasty, loop recorder insertion    Limitations loop recorder    Patient Stated Goals get my L hand and fingers working    Currently in Pain? No/denies           LUE functional reaching and pinching to place and remove  yellow clothespins from antenna w/ min to mod assist. Stacking and un stacking larger and smaller cones from elevated surface LUE using Rt hand to stabilize cones.  UBE x 8 min. W/ Lt hand wrapped for reciprocal movement and light conditioning                       OT Short Term Goals - 04/22/20 1400      OT SHORT TERM GOAL #1   Title Pt will be independent with initial HEP--check STG 03/24/20    Time 4    Period Weeks    Status Achieved      OT SHORT TERM GOAL #2   Title Pt will verbalize understanding of proper positioning of LUE to prevent pain/injury and edema management  techniques.    Time 4    Period Weeks    Status Achieved      OT SHORT TERM GOAL #3   Title Pt will be independent with splint wear/care for improved positioning.    Time 4    Period Weeks    Status Achieved      OT SHORT TERM GOAL #4   Title Pt will be able to use LUE as a stabilizer for simple ADL tasks.    Time 4    Period Weeks    Status On-going   04/22/20:  inconsistent     OT SHORT TERM GOAL #5   Title Pt will demo at least 25% gross finger flex in prep for functional grasp.    Baseline 4    Time 4    Period Weeks    Status Achieved   except index and thumb            OT Long Term Goals - 04/24/20 1734      OT LONG TERM GOAL #1   Title Pt will be independent with updated HEP.--check LTGs 04/23/20    Time 8    Period Weeks    Status On-going      OT LONG TERM GOAL #2   Title Pt will be able to use LUE as nondominant assist for ADLs at least 50% of the time.    Time 8    Period Weeks    Status On-going   04/22/20:  25% of the time     OT LONG TERM GOAL #3   Title Pt will be able to score at least 10 on box and blocks test with LUE for improved coordination for ADLs.    Time 8    Period Weeks    Status On-going   04/24/20:  4 blocks     OT LONG TERM GOAL #4   Title Pt will complete clothing fasteners mod I using AE prn.    Time 8    Period Weeks    Status On-going   04/24/20: unable to use Lt hand for this at this time     OT LONG TERM GOAL #5   Title Pt will demo improved attention/visual scanning to be able to perform environmental scanning in busy enivornment with at least 95% accuracy for incr safety for community activities/driving    Baseline 03/11/79;  58% initially    Time 8    Period Weeks    Status On-going   04/24/20: 69% accuracy     OT LONG TERM GOAL #6   Title Pt will demo at least 75% gross finger flex/ext for grasp/release of objects.    Time 8  Period Weeks    Status On-going   04/22/20:  approx 75% extension approx 50% flexion  (performance inconsistent at times)                Plan - 05/19/20 1418    Clinical Impression Statement Pt continues to make slow progress LUE w/ cues needed to attend to arm correctly and to id which movements are needed for task.    Occupational performance deficits (Please refer to evaluation for details): ADL's;IADL's;Leisure;Social Participation;Work    Marketing executive / Function / Physical Skills ADL;Dexterity;ROM;IADL;Sensation;Mobility;Strength;FMC;Coordination;Decreased knowledge of precautions;Decreased knowledge of use of DME;Pain;UE functional use;GMC;Proprioception    Cognitive Skills Attention;Memory;Perception;Safety Awareness    OT Frequency 2x / week    OT Duration 8 weeks    OT Treatment/Interventions Self-care/ADL training;Moist Heat;Fluidtherapy;DME and/or AE instruction;Splinting;Therapeutic activities;Contrast Bath;Aquatic Therapy;Ultrasound;Therapeutic exercise;Cognitive remediation/compensation;Visual/perceptual remediation/compensation;Passive range of motion;Functional Mobility Training;Neuromuscular education;Cryotherapy;Electrical Stimulation;Paraffin;Energy conservation;Manual Therapy;Patient/family education    Plan continue NMR and functional reach LUE, attention to Lt side    Consulted and Agree with Plan of Care Patient           Patient will benefit from skilled therapeutic intervention in order to improve the following deficits and impairments:   Body Structure / Function / Physical Skills: ADL, Dexterity, ROM, IADL, Sensation, Mobility, Strength, FMC, Coordination, Decreased knowledge of precautions, Decreased knowledge of use of DME, Pain, UE functional use, GMC, Proprioception Cognitive Skills: Attention, Memory, Perception, Safety Awareness     Visit Diagnosis: Hemiplegia and hemiparesis following cerebral infarction affecting left non-dominant side (HCC)  Other symptoms and signs involving the nervous system    Problem List Patient  Active Problem List   Diagnosis Date Noted  . Left hemiparesis ([place]) 03/28/2020  . Chronic bilateral low back pain without sciatica 02/25/2020  . Spasticity as late effect of cerebrovascular accident (CVA) 02/25/2020  . Cognitive and neurobehavioral dysfunction   . Hypertensive urgency 01/28/2020  . Urinary retention 01/28/2020  . Right middle cerebral artery stroke ([place]) 01/28/2020  . Stroke (cerebrum) ([place]) -  [name] 01/25/2020  . Acute respiratory failure with hypoxia ([place])   . Hypotension   . Hypokalemia   . Hypocalcemia   . Encephalopathy acute   . Stroke ([place]) 01/24/2020  . Middle cerebral artery embolism, right 01/24/2020  . Hyperlipidemia 02/21/2019  . Impingement syndrome of right shoulder region 02/21/2018  . OA (osteoarthritis) of hip 01/18/2018  . Prostate cancer ([place]) 03/04/2016  . Essential hypertension 11/27/2014  . History of colonic polyps 09/26/2014  . BACK PAIN, LEFT 12/22/2009  . PROSTATE SPECIFIC ANTIGEN, ELEVATED 12/20/2008  . LATERAL EPICONDYLITIS 07/15/2008  . ACTINIC KERATOSIS, FOREHEAD, LEFT 05/23/2008  . WART, LEFT HAND 07/19/2007  . ERECTILE DYSFUNCTION, MILD 07/19/2007  . HERNIA, BILATERAL INGUINAL W/O OBST/GANGRENE 07/19/2007  . MENISCUS TEAR 07/19/2007  . Bilateral inguinal hernia 07/19/2007    Carey Bullocks, OTR/L 05/19/2020, 2:20 PM  [facility] [address] [place], [place], [address] Phone: [phone]   Fax:  [phone]  Name: KAID SEEBERGER MRN: [id number] Date of Birth: 11/27/1951

## 2020-05-20 ENCOUNTER — Ambulatory Visit: Payer: PPO | Admitting: Occupational Therapy

## 2020-05-20 DIAGNOSIS — I69354 Hemiplegia and hemiparesis following cerebral infarction affecting left non-dominant side: Secondary | ICD-10-CM | POA: Diagnosis not present

## 2020-05-20 DIAGNOSIS — R41840 Attention and concentration deficit: Secondary | ICD-10-CM

## 2020-05-20 DIAGNOSIS — R29818 Other symptoms and signs involving the nervous system: Secondary | ICD-10-CM

## 2020-05-20 NOTE — Therapy (Signed)
[facility] [address] [place], [place], [address] Phone: [phone]   Fax:  [phone]  Occupational Therapy Treatment  Patient Details  Name: Ruben Chavez MRN: [id number] Date of Birth: 07/30/1951 Referring Provider (OT): Lauraine Rinne, PA-C   Encounter Date: 05/20/2020   OT End of Session - 05/20/20 1005    Visit Number 23    Number of Visits 33    Date for OT Re-Evaluation 06/22/20    Authorization Type [employer] (follow Medicare)--1 copay for multiple visits    Authorization Time Period cert date 6/76 - 1/95/09    Authorization - Visit Number 23    Authorization - Number of Visits 30    Progress Note Due on Visit 30    Activity Tolerance Patient tolerated treatment well    Behavior During Therapy [facility] for tasks assessed/performed           Past Medical History:  Diagnosis Date  . Elevated PSA   . Hyperlipidemia    pt says not anymore  . Hypertension   . Prostate cancer ([place]) 2013    Past Surgical History:  Procedure Laterality Date  . bilateral inguinal hernia  2008  . BUBBLE STUDY  01/28/2020   Procedure: BUBBLE STUDY;  Surgeon: [name], MD;  Location: [facility] ENDOSCOPY;  Service: Cardiovascular;;  . CYST REMOVAL TRUNK  2016   sebaceous cyst  . CYSTOSCOPY  04/22/2016   Procedure: CYSTOSCOPY;  Surgeon: [name], MD;  [facility];  Service: Urology;;  . IR ANGIO VERTEBRAL SEL SUBCLAVIAN INNOMINATE UNI R MOD SED  01/25/2020  . IR CT HEAD LTD  01/25/2020  . IR PERCUTANEOUS ART THROMBECTOMY/INFUSION INTRACRANIAL INC DIAG ANGIO  01/25/2020  . KNEE ARTHROSCOPY  2007   right  . LOOP RECORDER INSERTION N/A 01/28/2020   Procedure: LOOP RECORDER INSERTION;  Surgeon: [name], MD;  Location: [place] CV LAB;  Service: Cardiovascular;  Laterality: N/A;  . PROSTATE BIOPSY  2008  . PROSTATE BIOPSY  2013  . PROSTATE BIOPSY  01/28/2016  . RADIOACTIVE SEED  IMPLANT N/A 04/22/2016   Procedure: RADIOACTIVE SEED IMPLANT/BRACHYTHERAPY IMPLANT;  Surgeon: [name], MD;  Location: [facility];  Service: Urology;  Laterality: N/A;  . RADIOLOGY WITH ANESTHESIA N/A 01/24/2020   Procedure: IR WITH ANESTHESIA;  Surgeon: [name], MD;  Location: [facility];  Service: Radiology;  Laterality: N/A;  . TEE WITHOUT CARDIOVERSION N/A 01/28/2020   Procedure: TRANSESOPHAGEAL ECHOCARDIOGRAM (TEE);  Surgeon: [name], MD;  Location: [facility] ENDOSCOPY;  Service: Cardiovascular;  Laterality: N/A;  . TONSILLECTOMY    . TOTAL HIP ARTHROPLASTY  2001   left  . TOTAL HIP ARTHROPLASTY Right 01/18/2018   Procedure: RIGHT TOTAL HIP ARTHROPLASTY ANTERIOR APPROACH;  Surgeon: [name], MD;  Location: [facility];  Service: Orthopedics;  Laterality: Right;    There were no vitals filed for this visit.   Subjective Assessment - 05/20/20 0935    Pertinent History PMH:  hyperlipidemia, hypertension, prostate cancer radioactive seed implant 2017, R total hip arthroplasty, loop recorder insertion    Limitations loop recorder    Patient Stated Goals get my L hand and fingers working    Currently in Pain? No/denies           High level AA/ROM LUE sh flexion with controlled movement using UE Ranger.  Worked on combined movements distally (elbow flex, forearm neutral rotation, radial deviation and wrist ext while holding cup to improve  function with min to mod assist required - pt compensates either into elbow extension or forearm supination. ALso worked on maintaining grasp of cup while performing pronation and wrist flexion to pour water out of cup  - pt tends to open fingers with this due to tenodesis. Pt shown ex to work on at home for RD w/ elbow bent and forearm supported in neutral.  NMES x 10 min for wrist ext and RD as able (placing electrodes more radially) - 50 pps, 250 pw, 10 sec on/off cycle, Int = 22                       OT  Short Term Goals - 04/22/20 1400      OT SHORT TERM GOAL #1   Title Pt will be independent with initial HEP--check STG 03/24/20    Time 4    Period Weeks    Status Achieved      OT SHORT TERM GOAL #2   Title Pt will verbalize understanding of proper positioning of LUE to prevent pain/injury and edema management techniques.    Time 4    Period Weeks    Status Achieved      OT SHORT TERM GOAL #3   Title Pt will be independent with splint wear/care for improved positioning.    Time 4    Period Weeks    Status Achieved      OT SHORT TERM GOAL #4   Title Pt will be able to use LUE as a stabilizer for simple ADL tasks.    Time 4    Period Weeks    Status On-going   04/22/20:  inconsistent     OT SHORT TERM GOAL #5   Title Pt will demo at least 25% gross finger flex in prep for functional grasp.    Baseline 4    Time 4    Period Weeks    Status Achieved   except index and thumb            OT Long Term Goals - 04/24/20 1734      OT LONG TERM GOAL #1   Title Pt will be independent with updated HEP.--check LTGs 04/23/20    Time 8    Period Weeks    Status On-going      OT LONG TERM GOAL #2   Title Pt will be able to use LUE as nondominant assist for ADLs at least 50% of the time.    Time 8    Period Weeks    Status On-going   04/22/20:  25% of the time     OT LONG TERM GOAL #3   Title Pt will be able to score at least 10 on box and blocks test with LUE for improved coordination for ADLs.    Time 8    Period Weeks    Status On-going   04/24/20:  4 blocks     OT LONG TERM GOAL #4   Title Pt will complete clothing fasteners mod I using AE prn.    Time 8    Period Weeks    Status On-going   04/24/20: unable to use Lt hand for this at this time     OT LONG TERM GOAL #5   Title Pt will demo improved attention/visual scanning to be able to perform environmental scanning in busy enivornment with at least 95% accuracy for incr safety for community activities/driving     Baseline 03/14/20;  58% initially    Time 8    Period Weeks    Status On-going   04/24/20: 69% accuracy     OT LONG TERM GOAL #6   Title Pt will demo at least 75% gross finger flex/ext for grasp/release of objects.    Time 8    Period Weeks    Status On-going   04/22/20:  approx 75% extension approx 50% flexion (performance inconsistent at times)                Plan - 05/20/20 1005    Clinical Impression Statement Pt continues to make slow progress LUE w/ cues needed to attend to arm correctly and to id which movements are needed for task. Pt more aware of need for radial deviation for functional tasks but compensations into elbow ext and forearm supination    Occupational performance deficits (Please refer to evaluation for details): ADL's;IADL's;Leisure;Social Participation;Work    Marketing executive / Function / Physical Skills ADL;Dexterity;ROM;IADL;Sensation;Mobility;Strength;FMC;Coordination;Decreased knowledge of precautions;Decreased knowledge of use of DME;Pain;UE functional use;GMC;Proprioception    Cognitive Skills Attention;Memory;Perception;Safety Awareness    OT Frequency 2x / week    OT Duration 8 weeks    OT Treatment/Interventions Self-care/ADL training;Moist Heat;Fluidtherapy;DME and/or AE instruction;Splinting;Therapeutic activities;Contrast Bath;Aquatic Therapy;Ultrasound;Therapeutic exercise;Cognitive remediation/compensation;Visual/perceptual remediation/compensation;Passive range of motion;Functional Mobility Training;Neuromuscular education;Cryotherapy;Electrical Stimulation;Paraffin;Energy conservation;Manual Therapy;Patient/family education    Plan thumb splint for palmer abduction, UBE w/ hand wrapped, pt to bring in pants to practice hooking buttons    Consulted and Agree with Plan of Care Patient           Patient will benefit from skilled therapeutic intervention in order to improve the following deficits and impairments:   Body Structure / Function /  Physical Skills: ADL, Dexterity, ROM, IADL, Sensation, Mobility, Strength, FMC, Coordination, Decreased knowledge of precautions, Decreased knowledge of use of DME, Pain, UE functional use, GMC, Proprioception Cognitive Skills: Attention, Memory, Perception, Safety Awareness     Visit Diagnosis: Hemiplegia and hemiparesis following cerebral infarction affecting left non-dominant side (HCC)  Other symptoms and signs involving the nervous system  Attention and concentration deficit    Problem List Patient Active Problem List   Diagnosis Date Noted  . Left hemiparesis ([place]) 03/28/2020  . Chronic bilateral low back pain without sciatica 02/25/2020  . Spasticity as late effect of cerebrovascular accident (CVA) 02/25/2020  . Cognitive and neurobehavioral dysfunction   . Hypertensive urgency 01/28/2020  . Urinary retention 01/28/2020  . Right middle cerebral artery stroke ([place]) 01/28/2020  . Stroke (cerebrum) ([place]) -  [name] 01/25/2020  . Acute respiratory failure with hypoxia ([place])   . Hypotension   . Hypokalemia   . Hypocalcemia   . Encephalopathy acute   . Stroke ([place]) 01/24/2020  . Middle cerebral artery embolism, right 01/24/2020  . Hyperlipidemia 02/21/2019  . Impingement syndrome of right shoulder region 02/21/2018  . OA (osteoarthritis) of hip 01/18/2018  . Prostate cancer ([place]) 03/04/2016  . Essential hypertension 11/27/2014  . History of colonic polyps 09/26/2014  . BACK PAIN, LEFT 12/22/2009  . PROSTATE SPECIFIC ANTIGEN, ELEVATED 12/20/2008  . LATERAL EPICONDYLITIS 07/15/2008  . ACTINIC KERATOSIS, FOREHEAD, LEFT 05/23/2008  . WART, LEFT HAND 07/19/2007  . ERECTILE DYSFUNCTION, MILD 07/19/2007  . HERNIA, BILATERAL INGUINAL W/O OBST/GANGRENE 07/19/2007  . MENISCUS TEAR 07/19/2007  . Bilateral inguinal hernia 07/19/2007    Carey Bullocks,  OTR/L 05/20/2020, 10:32 AM  [facility] [address] [place], [place], [address] Phone: [phone]   Fax:  [phone]  Name: Ruben Chavez MRN: [id number] Date of Birth: November 18, 1951

## 2020-05-26 ENCOUNTER — Ambulatory Visit: Payer: PPO | Admitting: Occupational Therapy

## 2020-05-26 ENCOUNTER — Other Ambulatory Visit: Payer: Self-pay

## 2020-05-26 DIAGNOSIS — R278 Other lack of coordination: Secondary | ICD-10-CM

## 2020-05-26 DIAGNOSIS — R29818 Other symptoms and signs involving the nervous system: Secondary | ICD-10-CM

## 2020-05-26 DIAGNOSIS — I69354 Hemiplegia and hemiparesis following cerebral infarction affecting left non-dominant side: Secondary | ICD-10-CM

## 2020-05-26 NOTE — Patient Instructions (Signed)
Your Splint This splint should initially be fitted by a healthcare practitioner.  The healthcare practitioner is responsible for providing wearing instructions and precautions to the patient, other healthcare practitioners and care provider involved in the patient's care.  This splint was custom made for you. Please read the following instructions to learn about wearing and caring for your splint.  Precautions Should your splint cause any of the following problems, remove the splint immediately and contact your therapist/physician.  Swelling  Severe Pain  Pressure Areas  Stiffness  Numbness  Do not wear your splint while operating machinery unless it has been fabricated for that purpose.  When To Wear Your Splint Where your splint according to your therapist/physician instructions. Nights and rest periods only (however, begin by wearing some during the day to build up tolerance and monitor skin, then can switch to night time)  Care and Cleaning of Your Splint 1. Keep your splint away from open flames. 2. Your splint will lose its shape in temperatures over 135 degrees Farenheit, ( in car windows, near radiators, ovens or in hot water).  Never make any adjustments to your splint, if the splint needs adjusting remove it and make an appointment to see your therapist. 3. Your splint, may be cleaned with rubbing alcohol/alcohol wipes.  Do not immerse in hot water over 135 degrees Farenheit.

## 2020-05-26 NOTE — Therapy (Signed)
[facility] [address] [place], [place], [address] Phone: [phone]   Fax:  [phone]  Occupational Therapy Treatment  Patient Details  Name: Ruben Chavez MRN: [id number] Date of Birth: 1951-11-22 Referring Provider (OT): Lauraine Rinne, PA-C   Encounter Date: 05/26/2020   OT End of Session - 05/26/20 1416    Visit Number 24    Number of Visits 33    Date for OT Re-Evaluation 06/22/20    Authorization Type [employer] (follow Medicare)--1 copay for multiple visits    Authorization Time Period cert date 6/96 - 2/95/28    Authorization - Visit Number 24    Authorization - Number of Visits 30    Progress Note Due on Visit 30    OT Start Time 0930    OT Stop Time 1015    OT Time Calculation (min) 45 min    Activity Tolerance Patient tolerated treatment well    Behavior During Therapy [facility] for tasks assessed/performed           Past Medical History:  Diagnosis Date  . Elevated PSA   . Hyperlipidemia    pt says not anymore  . Hypertension   . Prostate cancer ([place]) 2013    Past Surgical History:  Procedure Laterality Date  . bilateral inguinal hernia  2008  . BUBBLE STUDY  01/28/2020   Procedure: BUBBLE STUDY;  Surgeon: [name], MD;  Location: [facility] ENDOSCOPY;  Service: Cardiovascular;;  . CYST REMOVAL TRUNK  2016   sebaceous cyst  . CYSTOSCOPY  04/22/2016   Procedure: CYSTOSCOPY;  Surgeon: [name], MD;  Location: [facility];  Service: Urology;;  . IR ANGIO VERTEBRAL SEL SUBCLAVIAN INNOMINATE UNI R MOD SED  01/25/2020  . IR CT HEAD LTD  01/25/2020  . IR PERCUTANEOUS ART THROMBECTOMY/INFUSION INTRACRANIAL INC DIAG ANGIO  01/25/2020  . KNEE ARTHROSCOPY  2007   right  . LOOP RECORDER INSERTION N/A 01/28/2020   Procedure: LOOP RECORDER INSERTION;  Surgeon: [name], MD;  Location: [facility] CV LAB;  Service: Cardiovascular;  Laterality: N/A;  . PROSTATE BIOPSY  2008    . PROSTATE BIOPSY  2013  . PROSTATE BIOPSY  01/28/2016  . RADIOACTIVE SEED IMPLANT N/A 04/22/2016   Procedure: RADIOACTIVE SEED IMPLANT/BRACHYTHERAPY IMPLANT;  Surgeon: [name], MD;  Location: [facility];  Service: Urology;  Laterality: N/A;  . RADIOLOGY WITH ANESTHESIA N/A 01/24/2020   Procedure: IR WITH ANESTHESIA;  Surgeon: [name], MD;  Location: [facility];  Service: Radiology;  Laterality: N/A;  . TEE WITHOUT CARDIOVERSION N/A 01/28/2020   Procedure: TRANSESOPHAGEAL ECHOCARDIOGRAM (TEE);  Surgeon: [name], MD;  Location: [facility] ENDOSCOPY;  Service: Cardiovascular;  Laterality: N/A;  . TONSILLECTOMY    . TOTAL HIP ARTHROPLASTY  2001   left  . TOTAL HIP ARTHROPLASTY Right 01/18/2018   Procedure: RIGHT TOTAL HIP ARTHROPLASTY ANTERIOR APPROACH;  Surgeon: [name], MD;  Location: [facility];  Service: Orthopedics;  Laterality: Right;    There were no vitals filed for this visit.   Subjective Assessment - 05/26/20 0935    Subjective  I occasionally have pain in the Lt shoulder but not now    Pertinent History PMH:  hyperlipidemia, hypertension, prostate cancer radioactive seed implant 2017, R total hip arthroplasty, loop recorder insertion    Limitations loop recorder    Patient Stated Goals get my L hand and fingers working    Currently in Pain? No/denies  Pt practiced hooking/unhooking belt and button of pants w/ Rt hand, Lt hand/arm only able to assist to hold up shirt (against body).  Pt did not want a splint for the day, therefore did not make thumb splint and decided to make resting hand splint for wrist, thumb, and fingers support for pm wear. Half way through fabrication, pt explained he didn't want a daytime splint because the driving pre-evaluation asked if he wore a splint and thought this may prevent him from driving. Therapist explained that this would not limit his driving (although other deficits in cognition might). However  decided resting hand splint would still be beneficial for pm to support wrist, thumb and fingers (whereas his pre-fab splint only supports wrist). Will consider daytime splint for thumb palmer abd at a later date. Finished and issued resting hand splint and reviewed wear and care.  UBE x 5 min. Lt hand wrapped.                       OT Education - 05/26/20 1010    Education Details Splint wear and care    Person(s) Educated Patient    Methods Explanation;Handout;Demonstration    Comprehension Verbalized understanding            OT Short Term Goals - 04/22/20 1400      OT SHORT TERM GOAL #1   Title Pt will be independent with initial HEP--check STG 03/24/20    Time 4    Period Weeks    Status Achieved      OT SHORT TERM GOAL #2   Title Pt will verbalize understanding of proper positioning of LUE to prevent pain/injury and edema management techniques.    Time 4    Period Weeks    Status Achieved      OT SHORT TERM GOAL #3   Title Pt will be independent with splint wear/care for improved positioning.    Time 4    Period Weeks    Status Achieved      OT SHORT TERM GOAL #4   Title Pt will be able to use LUE as a stabilizer for simple ADL tasks.    Time 4    Period Weeks    Status On-going   04/22/20:  inconsistent     OT SHORT TERM GOAL #5   Title Pt will demo at least 25% gross finger flex in prep for functional grasp.    Baseline 4    Time 4    Period Weeks    Status Achieved   except index and thumb            OT Long Term Goals - 04/24/20 1734      OT LONG TERM GOAL #1   Title Pt will be independent with updated HEP.--check LTGs 04/23/20    Time 8    Period Weeks    Status On-going      OT LONG TERM GOAL #2   Title Pt will be able to use LUE as nondominant assist for ADLs at least 50% of the time.    Time 8    Period Weeks    Status On-going   04/22/20:  25% of the time     OT LONG TERM GOAL #3   Title Pt will be able to score at least  10 on box and blocks test with LUE for improved coordination for ADLs.    Time 8    Period Weeks  Status On-going   04/24/20:  4 blocks     OT LONG TERM GOAL #4   Title Pt will complete clothing fasteners mod I using AE prn.    Time 8    Period Weeks    Status On-going   04/24/20: unable to use Lt hand for this at this time     OT LONG TERM GOAL #5   Title Pt will demo improved attention/visual scanning to be able to perform environmental scanning in busy enivornment with at least 95% accuracy for incr safety for community activities/driving    Baseline 07/06/18;  58% initially    Time 8    Period Weeks    Status On-going   04/24/20: 69% accuracy     OT LONG TERM GOAL #6   Title Pt will demo at least 75% gross finger flex/ext for grasp/release of objects.    Time 8    Period Weeks    Status On-going   04/22/20:  approx 75% extension approx 50% flexion (performance inconsistent at times)                Plan - 05/26/20 1416    Clinical Impression Statement Pt continues to demo impairments in LUE/hand and awareness into deficits.    Occupational performance deficits (Please refer to evaluation for details): ADL's;IADL's;Leisure;Social Participation;Work    Marketing executive / Function / Physical Skills ADL;Dexterity;ROM;IADL;Sensation;Mobility;Strength;FMC;Coordination;Decreased knowledge of precautions;Decreased knowledge of use of DME;Pain;UE functional use;GMC;Proprioception    Cognitive Skills Attention;Memory;Perception;Safety Awareness    OT Frequency 2x / week    OT Duration 8 weeks    OT Treatment/Interventions Self-care/ADL training;Moist Heat;Fluidtherapy;DME and/or AE instruction;Splinting;Therapeutic activities;Contrast Bath;Aquatic Therapy;Ultrasound;Therapeutic exercise;Cognitive remediation/compensation;Visual/perceptual remediation/compensation;Passive range of motion;Functional Mobility Training;Neuromuscular education;Cryotherapy;Electrical Stimulation;Paraffin;Energy  conservation;Manual Therapy;Patient/family education    Plan continue NMR and functional use LUE, consider thumb splint for daytime    Consulted and Agree with Plan of Care Patient           Patient will benefit from skilled therapeutic intervention in order to improve the following deficits and impairments:   Body Structure / Function / Physical Skills: ADL, Dexterity, ROM, IADL, Sensation, Mobility, Strength, FMC, Coordination, Decreased knowledge of precautions, Decreased knowledge of use of DME, Pain, UE functional use, GMC, Proprioception Cognitive Skills: Attention, Memory, Perception, Safety Awareness     Visit Diagnosis: Hemiplegia and hemiparesis following cerebral infarction affecting left non-dominant side (HCC)  Other symptoms and signs involving the nervous system  Other lack of coordination    Problem List Patient Active Problem List   Diagnosis Date Noted  . Left hemiparesis ([place]) 03/28/2020  . Chronic bilateral low back pain without sciatica 02/25/2020  . Spasticity as late effect of cerebrovascular accident (CVA) 02/25/2020  . Cognitive and neurobehavioral dysfunction   . Hypertensive urgency 01/28/2020  . Urinary retention 01/28/2020  . Right middle cerebral artery stroke ([place]) 01/28/2020  . Stroke (cerebrum) ([place]) -  [name] 01/25/2020  . Acute respiratory failure with hypoxia ([place])   . Hypotension   . Hypokalemia   . Hypocalcemia   . Encephalopathy acute   . Stroke ([place]) 01/24/2020  . Middle cerebral artery embolism, right 01/24/2020  . Hyperlipidemia 02/21/2019  . Impingement syndrome of right shoulder region 02/21/2018  . OA (osteoarthritis) of hip 01/18/2018  . Prostate cancer ([place]) 03/04/2016  . Essential hypertension 11/27/2014  . History of colonic polyps 09/26/2014  . BACK  PAIN, LEFT 12/22/2009  . PROSTATE SPECIFIC ANTIGEN, ELEVATED  12/20/2008  . LATERAL EPICONDYLITIS 07/15/2008  . ACTINIC KERATOSIS, FOREHEAD, LEFT 05/23/2008  . WART, LEFT HAND 07/19/2007  . ERECTILE DYSFUNCTION, MILD 07/19/2007  . HERNIA, BILATERAL INGUINAL W/O OBST/GANGRENE 07/19/2007  . MENISCUS TEAR 07/19/2007  . Bilateral inguinal hernia 07/19/2007    Carey Bullocks, OTR/L 05/26/2020, 2:19 PM  [facility] [address] [place], [place], [address] Phone: [phone]   Fax:  [phone]  Name: Ruben Chavez MRN: [id number] Date of Birth: 06-04-1951

## 2020-05-28 ENCOUNTER — Other Ambulatory Visit: Payer: Self-pay

## 2020-05-28 ENCOUNTER — Encounter: Payer: PPO | Admitting: Occupational Therapy

## 2020-05-29 ENCOUNTER — Other Ambulatory Visit: Payer: Self-pay | Admitting: Radiology

## 2020-05-29 ENCOUNTER — Ambulatory Visit (INDEPENDENT_AMBULATORY_CARE_PROVIDER_SITE_OTHER): Payer: PPO | Admitting: Internal Medicine

## 2020-05-29 ENCOUNTER — Encounter: Payer: Self-pay | Admitting: Internal Medicine

## 2020-05-29 ENCOUNTER — Ambulatory Visit: Payer: PPO | Admitting: Occupational Therapy

## 2020-05-29 VITALS — BP 108/64 | HR 61 | Temp 97.4°F | Wt 170.0 lb

## 2020-05-29 DIAGNOSIS — I63511 Cerebral infarction due to unspecified occlusion or stenosis of right middle cerebral artery: Secondary | ICD-10-CM

## 2020-05-29 DIAGNOSIS — I10 Essential (primary) hypertension: Secondary | ICD-10-CM | POA: Diagnosis not present

## 2020-05-29 DIAGNOSIS — I69354 Hemiplegia and hemiparesis following cerebral infarction affecting left non-dominant side: Secondary | ICD-10-CM

## 2020-05-29 DIAGNOSIS — R252 Cramp and spasm: Secondary | ICD-10-CM

## 2020-05-29 DIAGNOSIS — F339 Major depressive disorder, recurrent, unspecified: Secondary | ICD-10-CM

## 2020-05-29 DIAGNOSIS — I69398 Other sequelae of cerebral infarction: Secondary | ICD-10-CM

## 2020-05-29 DIAGNOSIS — E785 Hyperlipidemia, unspecified: Secondary | ICD-10-CM

## 2020-05-29 DIAGNOSIS — I63411 Cerebral infarction due to embolism of right middle cerebral artery: Secondary | ICD-10-CM

## 2020-05-29 DIAGNOSIS — G8194 Hemiplegia, unspecified affecting left nondominant side: Secondary | ICD-10-CM

## 2020-05-29 DIAGNOSIS — R29818 Other symptoms and signs involving the nervous system: Secondary | ICD-10-CM

## 2020-05-29 MED ORDER — LISINOPRIL-HYDROCHLOROTHIAZIDE 20-25 MG PO TABS: 1.0000 | ORAL_TABLET | Freq: Every day | ORAL | 1 refills | Status: DC

## 2020-05-29 MED ORDER — BUPROPION HCL ER (XL) 300 MG PO TB24: 300.0000 mg | ORAL_TABLET | Freq: Every day | ORAL | 1 refills | Status: DC

## 2020-05-29 NOTE — Progress Notes (Signed)
Established Patient Office Visit     This visit occurred during the SARS-CoV-2 public health emergency.  Safety protocols were in place, including screening questions prior to the visit, additional usage of staff PPE, and extensive cleaning of exam room while observing appropriate contact time as indicated for disinfecting solutions.    CC/Reason for Visit: Follow-up chronic conditions  HPI: Ruben Chavez is a 69 y.o. male who is coming in today for the above mentioned reasons. Past Medical History is significant for: Well-controlled hypertension, well-controlled hyperlipidemia, history of prostate cancer, history of right hip replacement in 2019 due to osteoarthritis.  He suffered a right MCA CVA in February.  Has a repeat angiogram scheduled for tomorrow.  He has followed up as scheduled with neurology.  His wife is wondering about increasing his Wellbutrin dose as ever since the stroke he has had less patience and what she perceives to be some anger issues.  He is happy with his progression with physical therapy.  He has been cleared to drive.  He still has some fine motor issues with his left hand.  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   Past Medical/Surgical History: Past Medical History:  Diagnosis Date  . Elevated PSA   . Hyperlipidemia    pt says not anymore  . Hypertension   . Prostate cancer ([place]) 2013    Past Surgical History:  Procedure Laterality Date  . bilateral inguinal hernia  2008  . BUBBLE STUDY  01/28/2020   Procedure: BUBBLE STUDY;  Surgeon: [name], MD;  Location: [facility] ENDOSCOPY;  Service: Cardiovascular;;  . CYST REMOVAL TRUNK  2016   sebaceous cyst  . CYSTOSCOPY  04/22/2016   Procedure: CYSTOSCOPY;  Surgeon: [name], MD;  Location: [facility];  Service: Urology;;  . IR ANGIO VERTEBRAL SEL SUBCLAVIAN INNOMINATE UNI R MOD SED  01/25/2020  . IR CT HEAD LTD  01/25/2020  . IR PERCUTANEOUS ART  THROMBECTOMY/INFUSION INTRACRANIAL INC DIAG ANGIO  01/25/2020  . KNEE ARTHROSCOPY  2007   right  . LOOP RECORDER INSERTION N/A 01/28/2020   Procedure: LOOP RECORDER INSERTION;  Surgeon: [name], MD;  Location: [place] CV LAB;  Service: Cardiovascular;  Laterality: N/A;  . PROSTATE BIOPSY  2008  . PROSTATE BIOPSY  2013  . PROSTATE BIOPSY  01/28/2016  . RADIOACTIVE SEED IMPLANT N/A 04/22/2016   Procedure: RADIOACTIVE SEED IMPLANT/BRACHYTHERAPY IMPLANT;  Surgeon: [name], MD;  Location: [facility];  Service: Urology;  Laterality: N/A;  . RADIOLOGY WITH ANESTHESIA N/A 01/24/2020   Procedure: IR WITH ANESTHESIA;  Surgeon: [name], MD;  Location: [facility];  Service: Radiology;  Laterality: N/A;  . TEE WITHOUT CARDIOVERSION N/A 01/28/2020   Procedure: TRANSESOPHAGEAL ECHOCARDIOGRAM (TEE);  Surgeon: [name], MD;  Location: [facility] ENDOSCOPY;  Service: Cardiovascular;  Laterality: N/A;  . TONSILLECTOMY    . TOTAL HIP ARTHROPLASTY  2001   left  . TOTAL HIP ARTHROPLASTY Right 01/18/2018   Procedure: RIGHT TOTAL HIP ARTHROPLASTY ANTERIOR APPROACH;  Surgeon: [name], MD;  Location: [facility];  Service: Orthopedics;  Laterality: Right;    Social History:  reports that he quit smoking about 20 years ago. His smoking use included cigarettes. He has a 32.00 pack-year smoking history. He has never used smokeless tobacco. He reports current alcohol use of about 14.0 standard drinks of alcohol per week. He reports that he does not use drugs.  Allergies: Allergies  Allergen Reactions  . Sulfa Antibiotics Swelling and Other (See Comments)    Swelling in ankles   . Advil [Ibuprofen] Other (See Comments)    "Dots on chest" (Petechiae)  . Aleve [Naproxen] Other (See Comments)    "Dots on chest" (Petechiae)  . Betadine [Povidone Iodine] Itching and Rash  . Chloroxylenol (Antiseptic) Itching, Rash and Other (See Comments)    PCMX surgical sterilizing scrub  .  Poison Ivy Extract Rash  . Povidone-Iodine Itching and Rash    BETADINE    Family History:  Family History  Problem Relation Age of Onset  . Pancreatic cancer Mother   . Heart disease Father   . Colon cancer Neg Hx      Current Outpatient Medications:  .  acetaminophen (TYLENOL) 325 MG tablet, Take 2 tablets (650 mg total) by mouth every 4 (four) hours as needed for mild pain (or temp > 37.5 C (99.5 F))., Disp:  , Rfl:  .  aspirin EC 325 MG EC tablet, Take 1 tablet (325 mg total) by mouth daily., Disp: 30 tablet, Rfl: 0 .  atorvastatin (LIPITOR) 20 MG tablet, TAKE 1 TABLET(20 MG) BY MOUTH DAILY AT 6 PM (Patient taking differently: Take 20 mg by mouth daily. ), Disp: 90 tablet, Rfl: 1 .  folic acid (FOLVITE) 1 MG tablet, TAKE 1 TABLET(1 MG) BY MOUTH DAILY (Patient taking differently: Take 1 mg by mouth daily. ), Disp: 90 tablet, Rfl: 1 .  lisinopril-hydrochlorothiazide (ZESTORETIC) 20-25 MG tablet, Take 1 tablet by mouth daily., Disp: 90 tablet, Rfl: 1 .  Magnesium 250 MG TABS, Take 250 mg by mouth daily. , Disp: , Rfl:  .  Multiple Vitamin (MULTIVITAMIN WITH MINERALS) TABS tablet, Take 1 tablet by mouth daily., Disp:  , Rfl:  .  pantoprazole (PROTONIX) 40 MG tablet, TAKE 1 TABLET(40 MG) BY MOUTH DAILY (Patient taking differently: Take 40 mg by mouth daily. ), Disp: 90 tablet, Rfl: 1 .  senna-docusate (SENOKOT-S) 8.6-50 MG tablet, Take 2 tablets by mouth daily., Disp:  , Rfl:  .  traZODone (DESYREL) 100 MG tablet, Take 100 mg by mouth at bedtime., Disp: , Rfl:  .  buPROPion (WELLBUTRIN XL) 300 MG 24 hr tablet, Take 1 tablet (300 mg total) by mouth daily., Disp: 90 tablet, Rfl: 1  Review of Systems:  Constitutional: Denies fever, chills, diaphoresis, appetite change and fatigue.  HEENT: Denies photophobia, eye pain, redness, hearing loss, ear pain, congestion, sore throat, rhinorrhea, sneezing, mouth sores, trouble swallowing, neck pain, neck stiffness and tinnitus.   Respiratory: Denies  SOB, DOE, cough, chest tightness,  and wheezing.   Cardiovascular: Denies chest pain, palpitations and leg swelling.  Gastrointestinal: Denies nausea, vomiting, abdominal pain, diarrhea, constipation, blood in stool and abdominal distention.  Genitourinary: Denies dysuria, urgency, frequency, hematuria, flank pain and difficulty urinating.  Endocrine: Denies: hot or cold intolerance, sweats, changes in hair or nails, polyuria, polydipsia. Musculoskeletal: Denies myalgias, back pain, joint swelling, arthralgias and gait problem.  Skin: Denies pallor, rash and wound.  Neurological: Denies dizziness, seizures, syncope, weakness, light-headedness, numbness and headaches.  Hematological: Denies adenopathy. Easy bruising, personal or family bleeding history  Psychiatric/Behavioral: Denies suicidal ideation, mood changes, confusion, nervousness, sleep disturbance and agitation    Physical Exam: Vitals:   05/29/20 1445  BP: 108/64  Pulse: 61  Temp: (!) 97.4 F (36.3 C)  TempSrc: Temporal  SpO2: 98%  Weight: 170 lb (77.1 kg)    Body mass index is 24.39 kg/m.   Constitutional: NAD, calm,  comfortable Eyes: PERRL, lids and conjunctivae normal ENMT: Mucous membranes are moist. Respiratory: clear to auscultation bilaterally, no wheezing, no crackles. Normal respiratory effort. No accessory muscle use.  Cardiovascular: Regular rate and rhythm, no murmurs / rubs / gallops. No extremity edema.  Psychiatric: Normal judgment and insight. Alert and oriented x 3. Normal mood.    Impression and Plan:  Left hemiparesis ([place]) Spasticity as late effect of cerebrovascular accident (CVA) Cerebrovascular accident (CVA) due to embolism of right middle cerebral artery ([place]) Right middle cerebral artery stroke ([place]) -On aspirin, completed 3 months of dual antiplatelet therapy, being followed by neurology, for repeat angiogram tomorrow.  Hyperlipidemia, unspecified hyperlipidemia type -At goal with an  LDL of 54  Essential hypertension  - Plan: lisinopril-hydrochlorothiazide (ZESTORETIC) 20-25 MG tablet -Well-controlled  Depression, recurrent (HCC)  -Increase Wellbutrin from 150 to 300 mg.   Patient Instructions  -Nice seeing you today!!  -Increase wellbutrin to 300 mg daily.  -Schedule follow up in 6 months.     Lelon Frohlich, MD [facility]

## 2020-05-29 NOTE — Patient Instructions (Addendum)
-  Nice seeing you today!!  -Increase wellbutrin to 300 mg daily.  -Schedule follow up in 6 months.

## 2020-05-29 NOTE — Therapy (Signed)
[facility] [address] [place], [place], [address] Phone: [phone]   Fax:  [phone]  Occupational Therapy Treatment  Patient Details  Name: Ruben Chavez MRN: [id number] Date of Birth: 04-Sep-1951 Referring Provider (OT): Lauraine Rinne, PA-C   Encounter Date: 05/29/2020   OT End of Session - 05/29/20 1344    Visit Number 25    Number of Visits 33    Date for OT Re-Evaluation 07/04/20   extended d/t missed weeks   Authorization Type [employer] (follow Medicare)--1 copay for multiple visits    Authorization Time Period cert date 3/33 - 5/45/62    Authorization - Visit Number 25    Authorization - Number of Visits 30    Progress Note Due on Visit 30    OT Start Time 1145    OT Stop Time 1240    OT Time Calculation (min) 55 min    Activity Tolerance Patient tolerated treatment well    Behavior During Therapy [facility] for tasks assessed/performed           Past Medical History:  Diagnosis Date  . Elevated PSA   . Hyperlipidemia    pt says not anymore  . Hypertension   . Prostate cancer ([place]) 2013    Past Surgical History:  Procedure Laterality Date  . bilateral inguinal hernia  2008  . BUBBLE STUDY  01/28/2020   Procedure: BUBBLE STUDY;  Surgeon: [name], MD;  Location: [facility] ENDOSCOPY;  Service: Cardiovascular;;  . CYST REMOVAL TRUNK  2016   sebaceous cyst  . CYSTOSCOPY  04/22/2016   Procedure: CYSTOSCOPY;  Surgeon: [name], MD;  Location: [facility];  Service: Urology;;  . IR ANGIO VERTEBRAL SEL SUBCLAVIAN INNOMINATE UNI R MOD SED  01/25/2020  . IR CT HEAD LTD  01/25/2020  . IR PERCUTANEOUS ART THROMBECTOMY/INFUSION INTRACRANIAL INC DIAG ANGIO  01/25/2020  . KNEE ARTHROSCOPY  2007   right  . LOOP RECORDER INSERTION N/A 01/28/2020   Procedure: LOOP RECORDER INSERTION;  Surgeon: [name], MD;  Location: [place] CV LAB;  Service: Cardiovascular;  Laterality: N/A;    . PROSTATE BIOPSY  2008  . PROSTATE BIOPSY  2013  . PROSTATE BIOPSY  01/28/2016  . RADIOACTIVE SEED IMPLANT N/A 04/22/2016   Procedure: RADIOACTIVE SEED IMPLANT/BRACHYTHERAPY IMPLANT;  Surgeon: [name], MD;  Location: [facility];  Service: Urology;  Laterality: N/A;  . RADIOLOGY WITH ANESTHESIA N/A 01/24/2020   Procedure: IR WITH ANESTHESIA;  Surgeon: [name], MD;  Location: [facility];  Service: Radiology;  Laterality: N/A;  . TEE WITHOUT CARDIOVERSION N/A 01/28/2020   Procedure: TRANSESOPHAGEAL ECHOCARDIOGRAM (TEE);  Surgeon: [name], MD;  Location: [facility] ENDOSCOPY;  Service: Cardiovascular;  Laterality: N/A;  . TONSILLECTOMY    . TOTAL HIP ARTHROPLASTY  2001   left  . TOTAL HIP ARTHROPLASTY Right 01/18/2018   Procedure: RIGHT TOTAL HIP ARTHROPLASTY ANTERIOR APPROACH;  Surgeon: [name], MD;  Location: [facility];  Service: Orthopedics;  Laterality: Right;    There were no vitals filed for this visit.   Subjective Assessment - 05/29/20 1149    Subjective  I passed my driving evaluation and I drove here today. Can we make the thumb splint for the day? I will continue with the other splint at night - it feels fine; been wearing the past 3 nights.    Pertinent History PMH:  hyperlipidemia, hypertension, prostate cancer radioactive seed implant 2017, R total hip arthroplasty,  loop recorder insertion    Limitations loop recorder    Patient Stated Goals get my L hand and fingers working    Currently in Pain? No/denies           Fabricated and fitted thumb splint for daytime use per pt request and issued. Reviewed wear and care and issued. Recommended pt continue to wear resting hand splint at night, and wear this splint during the day, but can take off for some activities that require smaller grasping.  Pt attempting to place larger pegs in semicircular pegboard but required assist by placing peg into Lt hand from Rt hand - pt then could remove large pegs  Lt hand w/ extra time and compensations. Pt had to remove thumb splint for this activity, but it did appear to help w/ grasping for larger items (cup, etc)  NMES x 10 min to wrist/finger extensors at previous parameters.                        OT Short Term Goals - 04/22/20 1400      OT SHORT TERM GOAL #1   Title Pt will be independent with initial HEP--check STG 03/24/20    Time 4    Period Weeks    Status Achieved      OT SHORT TERM GOAL #2   Title Pt will verbalize understanding of proper positioning of LUE to prevent pain/injury and edema management techniques.    Time 4    Period Weeks    Status Achieved      OT SHORT TERM GOAL #3   Title Pt will be independent with splint wear/care for improved positioning.    Time 4    Period Weeks    Status Achieved      OT SHORT TERM GOAL #4   Title Pt will be able to use LUE as a stabilizer for simple ADL tasks.    Time 4    Period Weeks    Status On-going   04/22/20:  inconsistent     OT SHORT TERM GOAL #5   Title Pt will demo at least 25% gross finger flex in prep for functional grasp.    Baseline 4    Time 4    Period Weeks    Status Achieved   except index and thumb            OT Long Term Goals - 04/24/20 1734      OT LONG TERM GOAL #1   Title Pt will be independent with updated HEP.--check LTGs 04/23/20    Time 8    Period Weeks    Status On-going      OT LONG TERM GOAL #2   Title Pt will be able to use LUE as nondominant assist for ADLs at least 50% of the time.    Time 8    Period Weeks    Status On-going   04/22/20:  25% of the time     OT LONG TERM GOAL #3   Title Pt will be able to score at least 10 on box and blocks test with LUE for improved coordination for ADLs.    Time 8    Period Weeks    Status On-going   04/24/20:  4 blocks     OT LONG TERM GOAL #4   Title Pt will complete clothing fasteners mod I using AE prn.    Time 8    Period Weeks  Status On-going   04/24/20: unable to  use Lt hand for this at this time     OT LONG TERM GOAL #5   Title Pt will demo improved attention/visual scanning to be able to perform environmental scanning in busy enivornment with at least 95% accuracy for incr safety for community activities/driving    Baseline 03/07/95;  58% initially    Time 8    Period Weeks    Status On-going   04/24/20: 69% accuracy     OT LONG TERM GOAL #6   Title Pt will demo at least 75% gross finger flex/ext for grasp/release of objects.    Time 8    Period Weeks    Status On-going   04/22/20:  approx 75% extension approx 50% flexion (performance inconsistent at times)                Plan - 05/29/20 1345    Clinical Impression Statement Pt reports passing driving evaluation and now driving. Pt continues to demo decreased wrist extension and radial deviation as well as sufficient thumb palmer abduction for functional grasping. Pt also weak more proximally    Occupational performance deficits (Please refer to evaluation for details): ADL's;IADL's;Leisure;Social Participation;Work    Marketing executive / Function / Physical Skills ADL;Dexterity;ROM;IADL;Sensation;Mobility;Strength;FMC;Coordination;Decreased knowledge of precautions;Decreased knowledge of use of DME;Pain;UE functional use;GMC;Proprioception    Cognitive Skills Attention;Memory;Perception;Safety Awareness    OT Frequency 2x / week    OT Duration 8 weeks    OT Treatment/Interventions Self-care/ADL training;Moist Heat;Fluidtherapy;DME and/or AE instruction;Splinting;Therapeutic activities;Contrast Bath;Aquatic Therapy;Ultrasound;Therapeutic exercise;Cognitive remediation/compensation;Visual/perceptual remediation/compensation;Passive range of motion;Functional Mobility Training;Neuromuscular education;Cryotherapy;Electrical Stimulation;Paraffin;Energy conservation;Manual Therapy;Patient/family education    Plan continue NMR and functional use LUE    Consulted and Agree with Plan of Care Patient             Patient will benefit from skilled therapeutic intervention in order to improve the following deficits and impairments:   Body Structure / Function / Physical Skills: ADL, Dexterity, ROM, IADL, Sensation, Mobility, Strength, FMC, Coordination, Decreased knowledge of precautions, Decreased knowledge of use of DME, Pain, UE functional use, GMC, Proprioception Cognitive Skills: Attention, Memory, Perception, Safety Awareness     Visit Diagnosis: Hemiplegia and hemiparesis following cerebral infarction affecting left non-dominant side (HCC)  Other symptoms and signs involving the nervous system    Problem List Patient Active Problem List   Diagnosis Date Noted  . Left hemiparesis ([place]) 03/28/2020  . Chronic bilateral low back pain without sciatica 02/25/2020  . Spasticity as late effect of cerebrovascular accident (CVA) 02/25/2020  . Cognitive and neurobehavioral dysfunction   . Hypertensive urgency 01/28/2020  . Urinary retention 01/28/2020  . Right middle cerebral artery stroke ([place]) 01/28/2020  . Stroke (cerebrum) ([place]) -  [name] 01/25/2020  . Acute respiratory failure with hypoxia ([place])   . Hypotension   . Hypokalemia   . Hypocalcemia   . Encephalopathy acute   . Stroke ([place]) 01/24/2020  . Middle cerebral artery embolism, right 01/24/2020  . Hyperlipidemia 02/21/2019  . Impingement syndrome of right shoulder region 02/21/2018  . OA (osteoarthritis) of hip 01/18/2018  . Prostate cancer ([place]) 03/04/2016  . Essential hypertension 11/27/2014  . History of colonic polyps 09/26/2014  . BACK PAIN, LEFT 12/22/2009  . PROSTATE SPECIFIC ANTIGEN, ELEVATED 12/20/2008  . LATERAL EPICONDYLITIS 07/15/2008  . ACTINIC KERATOSIS, FOREHEAD, LEFT 05/23/2008  . WART, LEFT HAND 07/19/2007  .  ERECTILE DYSFUNCTION, MILD 07/19/2007  . HERNIA, BILATERAL INGUINAL W/O OBST/GANGRENE  07/19/2007  . MENISCUS TEAR 07/19/2007  . Bilateral inguinal hernia 07/19/2007    Carey Bullocks, OTR/L 05/29/2020, 1:57 PM  [facility] [address] [place], [place], [address] Phone: [phone]   Fax:  [phone]  Name: TEIGEN BELLIN MRN: [id number] Date of Birth: 1951/03/14

## 2020-05-30 ENCOUNTER — Other Ambulatory Visit: Payer: Self-pay

## 2020-05-30 ENCOUNTER — Other Ambulatory Visit (HOSPITAL_COMMUNITY): Payer: Self-pay | Admitting: Interventional Radiology

## 2020-05-30 ENCOUNTER — Ambulatory Visit (HOSPITAL_COMMUNITY)
Admission: RE | Admit: 2020-05-30 | Discharge: 2020-05-30 | Disposition: A | Discharge status: Home / Self Care | Payer: PPO | Source: Ambulatory Visit | Attending: Interventional Radiology | Admitting: Interventional Radiology

## 2020-05-30 DIAGNOSIS — Z888 Allergy status to other drugs, medicaments and biological substances status: Secondary | ICD-10-CM | POA: Diagnosis not present

## 2020-05-30 DIAGNOSIS — I6601 Occlusion and stenosis of right middle cerebral artery: Secondary | ICD-10-CM | POA: Diagnosis not present

## 2020-05-30 DIAGNOSIS — E785 Hyperlipidemia, unspecified: Secondary | ICD-10-CM | POA: Insufficient documentation

## 2020-05-30 DIAGNOSIS — Z882 Allergy status to sulfonamides status: Secondary | ICD-10-CM | POA: Insufficient documentation

## 2020-05-30 DIAGNOSIS — I639 Cerebral infarction, unspecified: Secondary | ICD-10-CM

## 2020-05-30 DIAGNOSIS — I63511 Cerebral infarction due to unspecified occlusion or stenosis of right middle cerebral artery: Secondary | ICD-10-CM | POA: Diagnosis not present

## 2020-05-30 DIAGNOSIS — Z79899 Other long term (current) drug therapy: Secondary | ICD-10-CM | POA: Diagnosis not present

## 2020-05-30 DIAGNOSIS — Z8546 Personal history of malignant neoplasm of prostate: Secondary | ICD-10-CM | POA: Insufficient documentation

## 2020-05-30 DIAGNOSIS — I10 Essential (primary) hypertension: Secondary | ICD-10-CM | POA: Diagnosis not present

## 2020-05-30 DIAGNOSIS — Z87891 Personal history of nicotine dependence: Secondary | ICD-10-CM | POA: Diagnosis not present

## 2020-05-30 DIAGNOSIS — Z7982 Long term (current) use of aspirin: Secondary | ICD-10-CM | POA: Diagnosis not present

## 2020-05-30 HISTORY — PX: IR ANGIO VERTEBRAL SEL VERTEBRAL BILAT MOD SED: IMG5369

## 2020-05-30 HISTORY — PX: IR US GUIDE VASC ACCESS RIGHT: IMG2390

## 2020-05-30 HISTORY — PX: IR ANGIO INTRA EXTRACRAN SEL COM CAROTID INNOMINATE BILAT MOD SED: IMG5360

## 2020-05-30 LAB — CBC WITH DIFFERENTIAL/PLATELET
Abs Immature Granulocytes: 0.03 10*3/uL (ref 0.00–0.07)
Basophils Absolute: 0 10*3/uL (ref 0.0–0.1)
Basophils Relative: 0 %
Eosinophils Absolute: 0.2 10*3/uL (ref 0.0–0.5)
Eosinophils Relative: 2 %
HCT: 36.6 % — ABNORMAL LOW (ref 39.0–52.0)
Hemoglobin: 12 g/dL — ABNORMAL LOW (ref 13.0–17.0)
Immature Granulocytes: 0 %
Lymphocytes Relative: 11 %
Lymphs Abs: 0.9 10*3/uL (ref 0.7–4.0)
MCH: 29.5 pg (ref 26.0–34.0)
MCHC: 32.8 g/dL (ref 30.0–36.0)
MCV: 89.9 fL (ref 80.0–100.0)
Monocytes Absolute: 0.5 10*3/uL (ref 0.1–1.0)
Monocytes Relative: 6 %
Neutro Abs: 6.5 10*3/uL (ref 1.7–7.7)
Neutrophils Relative %: 81 %
Platelets: 230 10*3/uL (ref 150–400)
RBC: 4.07 MIL/uL — ABNORMAL LOW (ref 4.22–5.81)
RDW: 15.7 % — ABNORMAL HIGH (ref 11.5–15.5)
WBC: 8.1 10*3/uL (ref 4.0–10.5)
nRBC: 0 % (ref 0.0–0.2)

## 2020-05-30 LAB — BASIC METABOLIC PANEL
Anion gap: 10 (ref 5–15)
BUN: 21 mg/dL (ref 8–23)
CO2: 26 mmol/L (ref 22–32)
Calcium: 9.2 mg/dL (ref 8.9–10.3)
Chloride: 101 mmol/L (ref 98–111)
Creatinine, Ser: 1.03 mg/dL (ref 0.61–1.24)
GFR calc Af Amer: >60 mL/min (ref >60)
GFR calc non Af Amer: >60 mL/min (ref >60)
Glucose, Bld: 104 mg/dL — ABNORMAL HIGH (ref 70–99)
Potassium: 3.7 mmol/L (ref 3.5–5.1)
Sodium: 137 mmol/L (ref 135–145)

## 2020-05-30 LAB — PROTIME-INR
INR: 1.1 (ref 0.8–1.2)
Prothrombin Time: 14 seconds (ref 11.4–15.2)

## 2020-05-30 MED ORDER — HEPARIN SODIUM (PORCINE) 1000 UNIT/ML IJ SOLN
INTRAMUSCULAR | Status: AC
  Filled 2020-05-30: qty 1

## 2020-05-30 MED ORDER — SODIUM CHLORIDE 0.9 % IV SOLN: INTRAVENOUS | Status: DC

## 2020-05-30 MED ORDER — LIDOCAINE HCL 1 % IJ SOLN
INTRAMUSCULAR | Status: AC
  Filled 2020-05-30: qty 20

## 2020-05-30 MED ORDER — NITROGLYCERIN 1 MG/10 ML FOR IR/CATH LAB
INTRA_ARTERIAL | Status: AC
  Filled 2020-05-30: qty 10

## 2020-05-30 MED ORDER — VERAPAMIL HCL 2.5 MG/ML IV SOLN
INTRAVENOUS | Status: AC | PRN
  Administered 2020-05-30: 1.25 mg via INTRAVENOUS

## 2020-05-30 MED ORDER — MIDAZOLAM HCL 2 MG/2ML IJ SOLN
INTRAMUSCULAR | Status: AC | PRN
  Administered 2020-05-30: 1 mg via INTRAVENOUS

## 2020-05-30 MED ORDER — SODIUM CHLORIDE 0.9 % IV BOLUS
INTRAVENOUS | Status: AC | PRN
  Administered 2020-05-30: 250 mL via INTRAVENOUS

## 2020-05-30 MED ORDER — NITROGLYCERIN 1 MG/10 ML FOR IR/CATH LAB
INTRA_ARTERIAL | Status: AC | PRN
  Administered 2020-05-30: 100 ug via INTRA_ARTERIAL

## 2020-05-30 MED ORDER — MIDAZOLAM HCL 2 MG/2ML IJ SOLN
INTRAMUSCULAR | Status: AC
  Filled 2020-05-30: qty 2

## 2020-05-30 MED ORDER — IOHEXOL 300 MG/ML  SOLN
50.0000 mL | Freq: Once | INTRAMUSCULAR | Status: AC | PRN
  Administered 2020-05-30: 10 mL via INTRA_ARTERIAL

## 2020-05-30 MED ORDER — FENTANYL CITRATE (PF) 100 MCG/2ML IJ SOLN
INTRAMUSCULAR | Status: AC | PRN
  Administered 2020-05-30: 25 ug via INTRAVENOUS

## 2020-05-30 MED ORDER — SODIUM CHLORIDE 0.9 % IV SOLN: INTRAVENOUS | Status: AC

## 2020-05-30 MED ORDER — IOHEXOL 300 MG/ML  SOLN
150.0000 mL | Freq: Once | INTRAMUSCULAR | Status: AC | PRN
  Administered 2020-05-30: 65 mL via INTRA_ARTERIAL

## 2020-05-30 MED ORDER — FENTANYL CITRATE (PF) 100 MCG/2ML IJ SOLN
INTRAMUSCULAR | Status: AC
  Filled 2020-05-30: qty 2

## 2020-05-30 MED ORDER — VERAPAMIL HCL 2.5 MG/ML IV SOLN
INTRAVENOUS | Status: AC
  Filled 2020-05-30: qty 2

## 2020-05-30 MED ORDER — HEPARIN SODIUM (PORCINE) 1000 UNIT/ML IJ SOLN
INTRAMUSCULAR | Status: AC | PRN
  Administered 2020-05-30 (×2): 1000 [IU] via INTRA_ARTERIAL

## 2020-05-30 NOTE — Discharge Instructions (Signed)
Radial Site Care  This sheet gives you information about how to care for yourself after your procedure. Your health care provider may also give you more specific instructions. If you have problems or questions, contact your health care provider. What can I expect after the procedure? After the procedure, it is common to have:  Bruising and tenderness at the catheter insertion area. Follow these instructions at home: Medicines  Take over-the-counter and prescription medicines only as told by your health care provider. Insertion site care  Follow instructions from your health care provider about how to take care of your insertion site. Make sure you: ? Wash your hands with soap and water before you change your bandage (dressing). If soap and water are not available, use hand sanitizer. ? Change your dressing as told by your health care provider. ? Leave stitches (sutures), skin glue, or adhesive strips in place. These skin closures may need to stay in place for 2 weeks or longer. If adhesive strip edges start to loosen and curl up, you may trim the loose edges. Do not remove adhesive strips completely unless your health care provider tells you to do that.  Check your insertion site every day for signs of infection. Check for: ? Redness, swelling, or pain. ? Fluid or blood. ? Pus or a bad smell. ? Warmth.  Do not take baths, swim, or use a hot tub until your health care provider approves.  You may shower 24-48 hours after the procedure, or as directed by your health care provider. ? Remove the dressing and gently wash the site with plain soap and water. ? Pat the area dry with a clean towel. ? Do not rub the site. That could cause bleeding.  Do not apply powder or lotion to the site. Activity   For 24 hours after the procedure, or as directed by your health care provider: ? Do not flex or bend the affected arm. ? Do not push or pull heavy objects with the affected arm. ? Do not  drive yourself home from the hospital or clinic. You may drive 24 hours after the procedure unless your health care provider tells you not to. ? Do not operate machinery or power tools.  Do not lift anything that is heavier than 10 lb (4.5 kg), or the limit that you are told, until your health care provider says that it is safe.  Ask your health care provider when it is okay to: ? Return to work or school. ? Resume usual physical activities or sports. ? Resume sexual activity. General instructions  If the catheter site starts to bleed, raise your arm and put firm pressure on the site. If the bleeding does not stop, get help right away. This is a medical emergency.  If you went home on the same day as your procedure, a responsible adult should be with you for the first 24 hours after you arrive home.  Keep all follow-up visits as told by your health care provider. This is important. Contact a health care provider if:  You have a fever.  You have redness, swelling, or yellow drainage around your insertion site. Get help right away if:  You have unusual pain at the radial site.  The catheter insertion area swells very fast.  The insertion area is bleeding, and the bleeding does not stop when you hold steady pressure on the area.  Your arm or hand becomes pale, cool, tingly, or numb. These symptoms may represent a serious problem   that is an emergency. Do not wait to see if the symptoms will go away. Get medical help right away. Call your local emergency services (911 in the [place] Do not drive yourself to the hospital. Summary  After the procedure, it is common to have bruising and tenderness at the site.  Follow instructions from your health care provider about how to take care of your radial site wound. Check the wound every day for signs of infection.  Do not lift anything that is heavier than 10 lb (4.5 kg), or the limit that you are told, until your health care provider says  that it is safe. This information is not intended to replace advice given to you by your health care provider. Make sure you discuss any questions you have with your health care provider. Document Revised: 12/28/2017 Document Reviewed: 12/28/2017 Elsevier Patient Education  2020 [employer]  

## 2020-05-30 NOTE — Procedures (Signed)
S/P 4 vessel cerebral arteriogram RT rad approach. Findings. 1.Interval occlusion of sup division of RT MCA. 2.Prominent RT PCA P3 collaterals. 3.Approx 30 % stenosis of RT VA origin. 4.Ulcerated plaque Prox Lt ICA bulb. S.Brionne Mertz MD

## 2020-05-30 NOTE — H&P (Signed)
Chief Complaint: Patient was seen in consultation today for right MCA stenosis/diagnostic cerebral arteriogram.  Referring Physician(s): Code Stroke- Kerney Elbe  Supervising Physician: [name]  Patient Status: [facility] - Out-pt  History of Present Illness: Ruben Chavez is a 69 y.o. male with a past medical history of hypertension, hyperlipidemia, CVA 01/2020, and prostate cancer. He is known to [facility] and has been followed by Dr. Estanislado Pandy since 01/2020. He first presented to our department as an active code stroke at the request of Dr. Cheral Marker. He underwent an image-guided cerebral arteriogram with emergent mechanical thrombectomy of right MCA inferior division occlusion achieving a TICI 2b revascularization 01/24/2020 by Dr. Estanislado Pandy. He has since been followed with routine imaging scans to monitor for changes. Most recent CTA head 05/08/2020 revealed possible right MCA stenosis versus occlusion.  CTA head/neck 05/08/2020: 1. Chronic right MCA infarct similar to the prior MRI. No acute abnormality. 2. Mild atherosclerotic disease in the carotid bifurcation bilaterally. Less than 25% diameter stenosis proximal left internal carotid artery. Right internal carotid artery without significant stenosis. 3. Moderate to severe stenosis origin of right vertebral artery unchanged. Left vertebral artery patent. Mild atherosclerotic disease distal vertebral artery bilaterally. 4. Interval occlusion of distal right M1 segment with poor perfusion in the superior and inferior division of the right MCA territory. There is a small early branch of the right MCA which appears to extend superiorly.  Patient presents today for an image-guided diagnostic cerebral arteriogram to accurately evaluate right MCA. Patient awake and alert laying in bed. Complains of left hand weakness, residual from prior CVA, improved since last consult. Denies fever, chills, chest pain, dyspnea, abdominal pain, headache, or  vision changes.   Past Medical History:  Diagnosis Date  . Elevated PSA   . Hyperlipidemia    pt says not anymore  . Hypertension   . Prostate cancer ([place]) 2013    Past Surgical History:  Procedure Laterality Date  . bilateral inguinal hernia  2008  . BUBBLE STUDY  01/28/2020   Procedure: BUBBLE STUDY;  Surgeon: [name], MD;  Location[facility] ENDOSCOPY;  Service: Cardiovascular;;  . CYST REMOVAL TRUNK  2016   sebaceous cyst  . CYSTOSCOPY  04/22/2016   Procedure: CYSTOSCOPY;  Surgeon: [name], MD;  Location: [facility];  Service: Urology;;  . IR ANGIO VERTEBRAL SEL SUBCLAVIAN INNOMINATE UNI R MOD SED  01/25/2020  . IR CT HEAD LTD  01/25/2020  . IR PERCUTANEOUS ART THROMBECTOMY/INFUSION INTRACRANIAL INC DIAG ANGIO  01/25/2020  . KNEE ARTHROSCOPY  2007   right  . LOOP RECORDER INSERTION N/A 01/28/2020   Procedure: LOOP RECORDER INSERTION;  Surgeon: [name], MD;  Location: [facility];  Service: Cardiovascular;  Laterality: N/A;  . PROSTATE BIOPSY  2008  . PROSTATE BIOPSY  2013  . PROSTATE BIOPSY  01/28/2016  . RADIOACTIVE SEED IMPLANT N/A 04/22/2016   Procedure: RADIOACTIVE SEED IMPLANT/BRACHYTHERAPY IMPLANT;  Surgeon: [name], MD;  Location: [facility];  Service: Urology;  Laterality: N/A;  . RADIOLOGY WITH ANESTHESIA N/A 01/24/2020   Procedure: IR WITH ANESTHESIA;  Surgeon: [name], MD;  Location: [facility];  Service: Radiology;  Laterality: N/A;  . TEE WITHOUT CARDIOVERSION N/A 01/28/2020   Procedure: TRANSESOPHAGEAL ECHOCARDIOGRAM (TEE);  Surgeon: [name], MD;  Location: [facility] ENDOSCOPY;  Service: Cardiovascular;  Laterality: N/A;  . TONSILLECTOMY    . TOTAL HIP ARTHROPLASTY  2001   left  . TOTAL HIP ARTHROPLASTY Right 01/18/2018  Procedure: RIGHT TOTAL HIP ARTHROPLASTY ANTERIOR APPROACH;  Surgeon: [name], MD;  Location: [facility];  Service: Orthopedics;  Laterality: Right;    Allergies: Sulfa  antibiotics, Advil [ibuprofen], Aleve [naproxen], Betadine [povidone iodine], Chloroxylenol (antiseptic), Poison ivy extract, and Povidone-iodine  Medications: Prior to Admission medications   Medication Sig Start Date End Date Taking? Authorizing Provider  acetaminophen (TYLENOL) 325 MG tablet Take 2 tablets (650 mg total) by mouth every 4 (four) hours as needed for mild pain (or temp > 37.5 C (99.5 F)). 02/12/20  Yes Angiulli, Lavon Paganini, PA-C  aspirin EC 325 MG EC tablet Take 1 tablet (325 mg total) by mouth daily. 01/29/20  Yes [name], NP  atorvastatin (LIPITOR) 20 MG tablet TAKE 1 TABLET(20 MG) BY MOUTH DAILY AT 6 PM Patient taking differently: Take 20 mg by mouth daily.  03/11/20  Yes Isaac Bliss, Rayford Halsted, MD  buPROPion (WELLBUTRIN XL) 300 MG 24 hr tablet Take 1 tablet (300 mg total) by mouth daily. 05/29/20  Yes Isaac Bliss, Rayford Halsted, MD  folic acid (FOLVITE) 1 MG tablet TAKE 1 TABLET(1 MG) BY MOUTH DAILY Patient taking differently: Take 1 mg by mouth daily.  04/08/20  Yes Isaac Bliss, Rayford Halsted, MD  lisinopril-hydrochlorothiazide (ZESTORETIC) 20-25 MG tablet Take 1 tablet by mouth daily. 05/29/20  Yes Isaac Bliss, Rayford Halsted, MD  Magnesium 250 MG TABS Take 250 mg by mouth daily.    Yes [provider]  Multiple Vitamin (MULTIVITAMIN WITH MINERALS) TABS tablet Take 1 tablet by mouth daily. 01/29/20  Yes [name], NP  pantoprazole (PROTONIX) 40 MG tablet TAKE 1 TABLET(40 MG) BY MOUTH DAILY Patient taking differently: Take 40 mg by mouth daily.  03/11/20  Yes Isaac Bliss, Rayford Halsted, MD  senna-docusate (SENOKOT-S) 8.6-50 MG tablet Take 2 tablets by mouth daily. 02/13/20  Yes Angiulli, Lavon Paganini, PA-C  traZODone (DESYREL) 100 MG tablet Take 100 mg by mouth at bedtime.   Yes [provider]     Family History  Problem Relation Age of Onset  . Pancreatic cancer Mother   . Heart disease Father   . Colon cancer Neg Hx     Social History    Socioeconomic History  . Marital status: Married    Spouse name: Not on file  . Number of children: Not on file  . Years of education: Not on file  . Highest education level: Not on file  Occupational History  . Not on file  Tobacco Use  . Smoking status: Former Smoker    Packs/day: 1.00    Years: 32.00    Pack years: 32.00    Types: Cigarettes    Quit date: 06/06/1999    Years since quitting: 20.9  . Smokeless tobacco: Never Used  Vaping Use  . Vaping Use: Never used  Substance and Sexual Activity  . Alcohol use: Yes    Alcohol/week: 14.0 standard drinks    Types: 14 Shots of liquor per week  . Drug use: No  . Sexual activity: Yes  Other Topics Concern  . Not on file  Social History Narrative  . Not on file   Social Determinants of Health   Financial Resource Strain:   . Difficulty of Paying Living Expenses:   Food Insecurity:   . Worried About Charity fundraiser in the Last Year:   . Arboriculturist in the Last Year:   Transportation Needs:   . Film/video editor (Medical):   Marland Kitchen  Lack of Transportation (Non-Medical):   Physical Activity:   . Days of Exercise per Week:   . Minutes of Exercise per Session:   Stress:   . Feeling of Stress :   Social Connections:   . Frequency of Communication with Friends and Family:   . Frequency of Social Gatherings with Friends and Family:   . Attends Religious Services:   . Active Member of Clubs or Organizations:   . Attends Archivist Meetings:   Marland Kitchen Marital Status:      Review of Systems: A 12 point ROS discussed and pertinent positives are indicated in the HPI above.  All other systems are negative.  Review of Systems  Constitutional: Negative for chills and fever.  Eyes: Negative for visual disturbance.  Respiratory: Negative for shortness of breath and wheezing.   Cardiovascular: Negative for chest pain and palpitations.  Neurological: Positive for weakness. Negative for headaches.   Psychiatric/Behavioral: Negative for behavioral problems and confusion.    Vital Signs: BP 107/65   Pulse 75   Temp (!) 97.5 F (36.4 C) (Oral)   Resp 14   Ht 5\' 11"  (1.803 m)   Wt 170 lb (77.1 kg)   SpO2 95%   BMI 23.71 kg/m   Physical Exam Vitals and nursing note reviewed.  Constitutional:      General: He is not in acute distress.    Appearance: Normal appearance.  Cardiovascular:     Rate and Rhythm: Normal rate and regular rhythm.     Heart sounds: Normal heart sounds. No murmur heard.   Pulmonary:     Effort: Pulmonary effort is normal. No respiratory distress.     Breath sounds: Normal breath sounds. No wheezing.  Skin:    General: Skin is warm and dry.  Neurological:     Mental Status: He is alert and oriented to person, place, and time.      MD Evaluation Airway: WNL Heart: WNL Abdomen: WNL Chest/ Lungs: WNL ASA  Classification: 3 Mallampati/Airway Score: Two   Imaging: CT ANGIO HEAD W OR WO CONTRAST  Result Date: 05/09/2020 CLINICAL DATA:  History of stroke. EXAM: CT ANGIOGRAPHY HEAD AND NECK TECHNIQUE: Multidetector CT imaging of the head and neck was performed using the standard protocol during bolus administration of intravenous contrast. Multiplanar CT image reconstructions and MIPs were obtained to evaluate the vascular anatomy. Carotid stenosis measurements (when applicable) are obtained utilizing NASCET criteria, using the distal internal carotid diameter as the denominator. CONTRAST:  164mL OMNIPAQUE IOHEXOL 350 MG/ML SOLN COMPARISON:  CTA head neck 01/24/2020 FINDINGS: CT HEAD FINDINGS Brain: Chronic infarct right MCA territory. Infarct involves the right anterior and middle frontal lobe as well as the lateral temporal lobe. The insula also is involved. Infarct territory similar to the diffusion abnormality on MRI from 01/25/2020 There is mild atrophy.  No acute infarct, hemorrhage, mass. Vascular: Negative for hyperdense vessel Skull: Negative  Sinuses: Paranasal sinuses clear. Orbits: Negative Review of the MIP images confirms the above findings CTA NECK FINDINGS Aortic arch: Standard branching. Imaged portion shows no evidence of aneurysm or dissection. No significant stenosis of the major arch vessel origins. Right carotid system: Mild atherosclerotic disease right carotid bifurcation without significant stenosis. Left carotid system: Moderate atherosclerotic calcification left carotid bifurcation. Less than 25% diameter stenosis proximal left internal carotid artery. Vertebral arteries: Both vertebral arteries are patent to the basilar. Mild atherosclerotic plaque right vertebral artery origin causing moderate to severe stenosis best seen on the sagittal images. Left  vertebral artery origin is widely patent. Skeleton: Cervical spondylosis.  No acute skeletal lesion. Other neck: Negative for mass or adenopathy in the neck. Upper chest: Lung apices clear bilaterally. Atherosclerotic aortic arch. Review of the MIP images confirms the above findings CTA HEAD FINDINGS Anterior circulation: Mild atherosclerotic calcification in the cavernous carotid bilaterally without stenosis. Anterior cerebral arteries patent bilaterally without stenosis. Left middle cerebral artery widely patent Occlusion distal right M1 segment. There is a small early branch of the right middle cerebral artery which appears to extend superiorly and anteriorly. The inferior division of the right MCA appears occluded with overall decreased perfusion in the right MCA territory including the superior and inferior MCA territories. Posterior circulation: Both vertebral arteries patent to the basilar with mild atherosclerotic disease distally. PICA patent bilaterally. Basilar widely patent. Superior cerebellar and posterior cerebral arteries patent bilaterally without stenosis. Venous sinuses: Normal venous enhancement. Anatomic variants: None Review of the MIP images confirms the above  findings IMPRESSION: 1. Chronic right MCA infarct similar to the prior MRI. No acute abnormality. 2. Mild atherosclerotic disease in the carotid bifurcation bilaterally. Less than 25% diameter stenosis proximal left internal carotid artery. Right internal carotid artery without significant stenosis. 3. Moderate to severe stenosis origin of right vertebral artery unchanged. Left vertebral artery patent. Mild atherosclerotic disease distal vertebral artery bilaterally. 4. Interval occlusion of distal right M1 segment with poor perfusion in the superior and inferior division of the right MCA territory. There is a small early branch of the right MCA which appears to extend superiorly. Electronically Signed   By: [name] M.D.   On: 05/09/2020 10:14   CT ANGIO NECK W OR WO CONTRAST  Result Date: 05/09/2020 CLINICAL DATA:  History of stroke. EXAM: CT ANGIOGRAPHY HEAD AND NECK TECHNIQUE: Multidetector CT imaging of the head and neck was performed using the standard protocol during bolus administration of intravenous contrast. Multiplanar CT image reconstructions and MIPs were obtained to evaluate the vascular anatomy. Carotid stenosis measurements (when applicable) are obtained utilizing NASCET criteria, using the distal internal carotid diameter as the denominator. CONTRAST:  130mL OMNIPAQUE IOHEXOL 350 MG/ML SOLN COMPARISON:  CTA head neck 01/24/2020 FINDINGS: CT HEAD FINDINGS Brain: Chronic infarct right MCA territory. Infarct involves the right anterior and middle frontal lobe as well as the lateral temporal lobe. The insula also is involved. Infarct territory similar to the diffusion abnormality on MRI from 01/25/2020 There is mild atrophy.  No acute infarct, hemorrhage, mass. Vascular: Negative for hyperdense vessel Skull: Negative Sinuses: Paranasal sinuses clear. Orbits: Negative Review of the MIP images confirms the above findings CTA NECK FINDINGS Aortic arch: Standard branching. Imaged portion shows no  evidence of aneurysm or dissection. No significant stenosis of the major arch vessel origins. Right carotid system: Mild atherosclerotic disease right carotid bifurcation without significant stenosis. Left carotid system: Moderate atherosclerotic calcification left carotid bifurcation. Less than 25% diameter stenosis proximal left internal carotid artery. Vertebral arteries: Both vertebral arteries are patent to the basilar. Mild atherosclerotic plaque right vertebral artery origin causing moderate to severe stenosis best seen on the sagittal images. Left vertebral artery origin is widely patent. Skeleton: Cervical spondylosis.  No acute skeletal lesion. Other neck: Negative for mass or adenopathy in the neck. Upper chest: Lung apices clear bilaterally. Atherosclerotic aortic arch. Review of the MIP images confirms the above findings CTA HEAD FINDINGS Anterior circulation: Mild atherosclerotic calcification in the cavernous carotid bilaterally without stenosis. Anterior cerebral arteries patent bilaterally without stenosis. Left middle cerebral artery  widely patent Occlusion distal right M1 segment. There is a small early branch of the right middle cerebral artery which appears to extend superiorly and anteriorly. The inferior division of the right MCA appears occluded with overall decreased perfusion in the right MCA territory including the superior and inferior MCA territories. Posterior circulation: Both vertebral arteries patent to the basilar with mild atherosclerotic disease distally. PICA patent bilaterally. Basilar widely patent. Superior cerebellar and posterior cerebral arteries patent bilaterally without stenosis. Venous sinuses: Normal venous enhancement. Anatomic variants: None Review of the MIP images confirms the above findings IMPRESSION: 1. Chronic right MCA infarct similar to the prior MRI. No acute abnormality. 2. Mild atherosclerotic disease in the carotid bifurcation bilaterally. Less than 25%  diameter stenosis proximal left internal carotid artery. Right internal carotid artery without significant stenosis. 3. Moderate to severe stenosis origin of right vertebral artery unchanged. Left vertebral artery patent. Mild atherosclerotic disease distal vertebral artery bilaterally. 4. Interval occlusion of distal right M1 segment with poor perfusion in the superior and inferior division of the right MCA territory. There is a small early branch of the right MCA which appears to extend superiorly. Electronically Signed   By: [name] M.D.   On: 05/09/2020 10:14   CUP PACEART REMOTE DEVICE CHECK  Result Date: 05/05/2020 [facility] summary report received. Battery status OK. Normal device function. No new symptom episodes, tachy episodes, brady, or pause episodes. No new AF episodes. Monthly summary reports and ROV/PRN [facility]   Labs:  CBC: Recent Labs    01/29/20 0601 02/05/20 0915 02/11/20 0520 05/30/20 0918  WBC 5.8 8.2 6.0 8.1  HGB 10.2* 10.7* 10.7* 12.0*  HCT 30.3* 32.9* 32.3* 36.6*  PLT 204 465* 471* 230    COAGS: Recent Labs    01/24/20 2006 05/30/20 0918  INR 1.0 1.1  APTT 27  --     BMP: Recent Labs    01/28/20 0400 01/28/20 0400 01/29/20 0601 01/29/20 0601 02/04/20 0628 02/05/20 0915 02/11/20 0520 05/08/20 1502  NA 138  --  139  --   --  137 140  --   K 3.7  --  3.7  --   --  3.7 3.8  --   CL 108  --  107  --   --  106 108  --   CO2 24  --  23  --   --  24 24  --   GLUCOSE 111*  --  103*  --   --  99 96  --   BUN 11  --  12  --   --  15 16  --   CALCIUM 8.0*  --  8.5*  --   --  8.7* 8.9  --   CREATININE 0.90   < > 0.88   < > 0.95 0.91 0.89 1.00  GFRNONAA >60   < > >60  --  >60 >60 >60  --   GFRAA >60   < > >60  --  >60 >60 >60  --    < > = values in this interval not displayed.    LIVER FUNCTION TESTS: Recent Labs    10/09/19 1234 01/24/20 2006 01/29/20 0601  BILITOT 0.7 1.1 0.4  AST 25 50* 26  ALT 26 55* 26  ALKPHOS 46 40 35*  PROT 7.3  7.3 5.4*  ALBUMIN 4.6 4.2 2.7*     Assessment and Plan:  History of acute CVA s/p cerebral arteriogram with emergent mechanical thrombectomy of right MCA  inferior division occlusion achieving a TICI 2b revascularization 01/24/2020 by Dr. Estanislado Pandy. CTA head 05/08/2020 revealed right MCA stenosis versus occlusion. Plan for image-guided diagnostic cerebral arteriogram today with Dr. Estanislado Pandy. Patient is NPO. Afebrile and WBCs WNL. He does not take blood thinners. INR 1.1 today.  Risks and benefits of diagnostic cerebral angiogram were discussed with the patient including, but not limited to bleeding, infection, vascular injury, stroke, or contrast induced renal failure. This interventional procedure involves the use of X-rays and because of the nature of the planned procedure, it is possible that we will have prolonged use of X-ray fluoroscopy. Potential radiation risks to you include (but are not limited to) the following: - A slightly elevated risk for cancer  several years later in life. This risk is typically less than 0.5% percent. This risk is low in comparison to the normal incidence of human cancer, which is 33% for women and 50% for men according to the [employer]. - Radiation induced injury can include skin redness, resembling a rash, tissue breakdown / ulcers and hair loss (which can be temporary or permanent).  The likelihood of either of these occurring depends on the difficulty of the procedure and whether you are sensitive to radiation due to previous procedures, disease, or genetic conditions.  IF your procedure requires a prolonged use of radiation, you will be notified and given written instructions for further action.  It is your responsibility to monitor the irradiated area for the 2 weeks following the procedure and to notify your physician if you are concerned that you have suffered a radiation induced injury.   All of the patient's questions were answered,  patient is agreeable to proceed. Consent signed and in chart.   Thank you for this interesting consult.  I greatly enjoyed meeting [employer] and look forward to participating in their care.  A copy of this report was sent to the requesting provider on this date.  Electronically Signed: Earley Abide, PA-C 05/30/2020, 10:10 AM   I spent a total of 40 Minutes in face to face in clinical consultation, greater than 50% of which was counseling/coordinating care for right MCA stenosis/diagnostic cerebral arteriogram.

## 2020-06-03 ENCOUNTER — Ambulatory Visit: Payer: PPO | Admitting: Occupational Therapy

## 2020-06-03 ENCOUNTER — Other Ambulatory Visit: Payer: Self-pay

## 2020-06-03 DIAGNOSIS — R278 Other lack of coordination: Secondary | ICD-10-CM

## 2020-06-03 DIAGNOSIS — I69354 Hemiplegia and hemiparesis following cerebral infarction affecting left non-dominant side: Secondary | ICD-10-CM

## 2020-06-03 DIAGNOSIS — R29818 Other symptoms and signs involving the nervous system: Secondary | ICD-10-CM

## 2020-06-03 NOTE — Therapy (Signed)
[facility] [address] [place], [place], [address] Phone: [phone]   Fax:  [phone]  Occupational Therapy Treatment  Patient Details  Name: Ruben Chavez MRN: [id number] Date of Birth: 04/09/1951 Referring Provider (OT): Lauraine Rinne, PA-C   Encounter Date: 06/03/2020   OT End of Session - 06/03/20 1430    Visit Number 26    Number of Visits 33    Date for OT Re-Evaluation 07/04/20    Authorization Type [employer] (follow Medicare)--1 copay for multiple visits    Authorization Time Period cert date 4/07 - 6/80/88    Authorization - Visit Number 47    Authorization - Number of Visits 30    Progress Note Due on Visit 43    OT Start Time 1242   Pt arrived late, but stayed after for modalities   OT Stop Time 1325    OT Time Calculation (min) 43 min    Activity Tolerance Patient tolerated treatment well    Behavior During Therapy [facility] for tasks assessed/performed           Past Medical History:  Diagnosis Date  . Elevated PSA   . Hyperlipidemia    pt says not anymore  . Hypertension   . Prostate cancer ([place]) 2013    Past Surgical History:  Procedure Laterality Date  . bilateral inguinal hernia  2008  . BUBBLE STUDY  01/28/2020   Procedure: BUBBLE STUDY;  Surgeon: [name], MD;  Location: [facility];  Service: Cardiovascular;;  . CYST REMOVAL TRUNK  2016   sebaceous cyst  . CYSTOSCOPY  04/22/2016   Procedure: CYSTOSCOPY;  Surgeon: [name], MD;  Location: [facility];  Service: Urology;;  . IR ANGIO INTRA EXTRACRAN SEL COM CAROTID INNOMINATE BILAT MOD SED  05/30/2020  . IR ANGIO VERTEBRAL SEL SUBCLAVIAN INNOMINATE UNI R MOD SED  01/25/2020  . IR ANGIO VERTEBRAL SEL VERTEBRAL BILAT MOD SED  05/30/2020  . IR CT HEAD LTD  01/25/2020  . IR PERCUTANEOUS ART THROMBECTOMY/INFUSION INTRACRANIAL INC DIAG ANGIO  01/25/2020  . IR US GUIDE VASC ACCESS RIGHT  05/30/2020  . KNEE  ARTHROSCOPY  2007   right  . LOOP RECORDER INSERTION N/A 01/28/2020   Procedure: LOOP RECORDER INSERTION;  Surgeon: [name], MD;  Location: [place];  Service: Cardiovascular;  Laterality: N/A;  . PROSTATE BIOPSY  2008  . PROSTATE BIOPSY  2013  . PROSTATE BIOPSY  01/28/2016  . RADIOACTIVE SEED IMPLANT N/A 04/22/2016   Procedure: RADIOACTIVE SEED IMPLANT/BRACHYTHERAPY IMPLANT;  Surgeon: [name], MD;  Location: [facility];  Service: Urology;  Laterality: N/A;  . RADIOLOGY WITH ANESTHESIA N/A 01/24/2020   Procedure: IR WITH ANESTHESIA;  Surgeon: [name], MD;  Location: [facility];  Service: Radiology;  Laterality: N/A;  . TEE WITHOUT CARDIOVERSION N/A 01/28/2020   Procedure: TRANSESOPHAGEAL ECHOCARDIOGRAM (TEE);  Surgeon: [name], MD;  Location: [facility];  Service: Cardiovascular;  Laterality: N/A;  . TONSILLECTOMY    . TOTAL HIP ARTHROPLASTY  2001   left  . TOTAL HIP ARTHROPLASTY Right 01/18/2018   Procedure: RIGHT TOTAL HIP ARTHROPLASTY ANTERIOR APPROACH;  Surgeon: [name], MD;  Location: [facility];  Service: Orthopedics;  Laterality: Right;    There were no vitals filed for this visit.   Subjective Assessment - 06/03/20 1246    Subjective  Pt reports mild pain Rt shoulder possibly from overdoing it at times (pt had previous injury to  Rt shoulder - torn biceps, bone spurs)    Pertinent History PMH:  hyperlipidemia, hypertension, prostate cancer radioactive seed implant 2017, R total hip arthroplasty, loop recorder insertion    Limitations loop recorder    Patient Stated Goals get my L hand and fingers working    Currently in Pain? Yes    Pain Score 2    up to 6/10   Pain Location Shoulder    Pain Orientation Right    Pain Descriptors / Indicators Aching    Pain Type Acute pain    Pain Onset In the past 7 days    Pain Frequency Intermittent    Aggravating Factors  overuse, lifting    Pain Relieving Factors rest, tylenol           Continued neuro re-educ with functional high level reaching tasks with and without thumb splint - pt lacks sufficient combined movement distally to use LUE without assist including decreased thumb palmer abduction, full composite ext, wrist RD and extension, and forearm rotation. Worked on these movements with assist from therapist and to stabilize objects with Rt hand.  UBE x 8 min, level 3, Lt hand wrapped, for reciprocal movement pattern.  NMES x 10 min to wrist/finger extensors w/ slight RD at previous parameters                        OT Short Term Goals - 04/22/20 1400      OT SHORT TERM GOAL #1   Title Pt will be independent with initial HEP--check STG 03/24/20    Time 4    Period Weeks    Status Achieved      OT SHORT TERM GOAL #2   Title Pt will verbalize understanding of proper positioning of LUE to prevent pain/injury and edema management techniques.    Time 4    Period Weeks    Status Achieved      OT SHORT TERM GOAL #3   Title Pt will be independent with splint wear/care for improved positioning.    Time 4    Period Weeks    Status Achieved      OT SHORT TERM GOAL #4   Title Pt will be able to use LUE as a stabilizer for simple ADL tasks.    Time 4    Period Weeks    Status On-going   04/22/20:  inconsistent     OT SHORT TERM GOAL #5   Title Pt will demo at least 25% gross finger flex in prep for functional grasp.    Baseline 4    Time 4    Period Weeks    Status Achieved   except index and thumb            OT Long Term Goals - 04/24/20 1734      OT LONG TERM GOAL #1   Title Pt will be independent with updated HEP.--check LTGs 04/23/20    Time 8    Period Weeks    Status On-going      OT LONG TERM GOAL #2   Title Pt will be able to use LUE as nondominant assist for ADLs at least 50% of the time.    Time 8    Period Weeks    Status On-going   04/22/20:  25% of the time     OT LONG TERM GOAL #3   Title Pt will be able to  score at least 10 on box  and blocks test with LUE for improved coordination for ADLs.    Time 8    Period Weeks    Status On-going   04/24/20:  4 blocks     OT LONG TERM GOAL #4   Title Pt will complete clothing fasteners mod I using AE prn.    Time 8    Period Weeks    Status On-going   04/24/20: unable to use Lt hand for this at this time     OT LONG TERM GOAL #5   Title Pt will demo improved attention/visual scanning to be able to perform environmental scanning in busy enivornment with at least 95% accuracy for incr safety for community activities/driving    Baseline 05/15/47;  58% initially    Time 8    Period Weeks    Status On-going   04/24/20: 69% accuracy     OT LONG TERM GOAL #6   Title Pt will demo at least 75% gross finger flex/ext for grasp/release of objects.    Time 8    Period Weeks    Status On-going   04/22/20:  approx 75% extension approx 50% flexion (performance inconsistent at times)                Plan - 06/03/20 1432    Clinical Impression Statement Pt slowly progressing with RUE function and awareness    Occupational performance deficits (Please refer to evaluation for details): ADL's;IADL's;Leisure;Social Participation;Work    Marketing executive / Function / Physical Skills ADL;Dexterity;ROM;IADL;Sensation;Mobility;Strength;FMC;Coordination;Decreased knowledge of precautions;Decreased knowledge of use of DME;Pain;UE functional use;GMC;Proprioception    Cognitive Skills Attention;Memory;Perception;Safety Awareness    OT Frequency 2x / week    OT Duration 8 weeks    OT Treatment/Interventions Self-care/ADL training;Moist Heat;Fluidtherapy;DME and/or AE instruction;Splinting;Therapeutic activities;Contrast Bath;Aquatic Therapy;Ultrasound;Therapeutic exercise;Cognitive remediation/compensation;Visual/perceptual remediation/compensation;Passive range of motion;Functional Mobility Training;Neuromuscular education;Cryotherapy;Electrical Stimulation;Paraffin;Energy  conservation;Manual Therapy;Patient/family education    Plan continue NMR and functional use LUE    Consulted and Agree with Plan of Care Patient           Patient will benefit from skilled therapeutic intervention in order to improve the following deficits and impairments:   Body Structure / Function / Physical Skills: ADL, Dexterity, ROM, IADL, Sensation, Mobility, Strength, FMC, Coordination, Decreased knowledge of precautions, Decreased knowledge of use of DME, Pain, UE functional use, GMC, Proprioception Cognitive Skills: Attention, Memory, Perception, Safety Awareness     Visit Diagnosis: Hemiplegia and hemiparesis following cerebral infarction affecting left non-dominant side (HCC)  Other symptoms and signs involving the nervous system  Other lack of coordination    Problem List Patient Active Problem List   Diagnosis Date Noted  . Left hemiparesis ([place]) 03/28/2020  . Chronic bilateral low back pain without sciatica 02/25/2020  . Spasticity as late effect of cerebrovascular accident (CVA) 02/25/2020  . Cognitive and neurobehavioral dysfunction   . Hypertensive urgency 01/28/2020  . Urinary retention 01/28/2020  . Right middle cerebral artery stroke ([place]) 01/28/2020  . Stroke (cerebrum) ([place]) -  [name] 01/25/2020  . Acute respiratory failure with hypoxia ([place])   . Hypotension   . Hypokalemia   . Hypocalcemia   . Encephalopathy acute   . Stroke ([place]) 01/24/2020  . Middle cerebral artery embolism, right 01/24/2020  . Hyperlipidemia 02/21/2019  . Impingement syndrome of right shoulder region 02/21/2018  . OA (osteoarthritis) of hip 01/18/2018  . Prostate cancer ([place]) 03/04/2016  .  Essential hypertension 11/27/2014  . History of colonic polyps 09/26/2014  . BACK PAIN, LEFT 12/22/2009  . PROSTATE SPECIFIC ANTIGEN, ELEVATED 12/20/2008  . LATERAL EPICONDYLITIS  07/15/2008  . ACTINIC KERATOSIS, FOREHEAD, LEFT 05/23/2008  . WART, LEFT HAND 07/19/2007  . ERECTILE DYSFUNCTION, MILD 07/19/2007  . HERNIA, BILATERAL INGUINAL W/O OBST/GANGRENE 07/19/2007  . MENISCUS TEAR 07/19/2007  . Bilateral inguinal hernia 07/19/2007    Carey Bullocks, OTR/L 06/03/2020, 2:33 PM  [facility] [address] [place], [place], [address] Phone: [phone]   Fax:  [phone]  Name: Ruben Chavez MRN: [id number] Date of Birth: 1950/12/07

## 2020-06-05 ENCOUNTER — Other Ambulatory Visit: Payer: Self-pay

## 2020-06-05 ENCOUNTER — Ambulatory Visit: Payer: PPO | Attending: Physician Assistant | Admitting: Occupational Therapy

## 2020-06-05 DIAGNOSIS — I69354 Hemiplegia and hemiparesis following cerebral infarction affecting left non-dominant side: Secondary | ICD-10-CM | POA: Diagnosis not present

## 2020-06-05 DIAGNOSIS — R278 Other lack of coordination: Secondary | ICD-10-CM | POA: Diagnosis not present

## 2020-06-05 DIAGNOSIS — R41840 Attention and concentration deficit: Secondary | ICD-10-CM | POA: Diagnosis not present

## 2020-06-05 DIAGNOSIS — R29818 Other symptoms and signs involving the nervous system: Secondary | ICD-10-CM | POA: Diagnosis not present

## 2020-06-05 NOTE — Therapy (Signed)
[facility] [address] [place], [place], [address] Phone: [phone]   Fax:  [phone]  Occupational Therapy Treatment  Patient Details  Name: Ruben Chavez MRN: [id number] Date of Birth: 04-Apr-1951 Referring Provider (OT): Lauraine Rinne, PA-C   Encounter Date: 06/05/2020   OT End of Session - 06/05/20 1405    Visit Number 27    Number of Visits 33    Date for OT Re-Evaluation 07/04/20    Authorization Type [employer] (follow Medicare)--1 copay for multiple visits    Authorization Time Period cert date 6/50 - 3/54/65    Authorization - Visit Number 6    Authorization - Number of Visits 30    Progress Note Due on Visit 30    OT Start Time 1400    OT Stop Time 1445    OT Time Calculation (min) 45 min    Activity Tolerance Patient tolerated treatment well    Behavior During Therapy [facility] for tasks assessed/performed           Past Medical History:  Diagnosis Date  . Elevated PSA   . Hyperlipidemia    pt says not anymore  . Hypertension   . Prostate cancer ([place]) 2013    Past Surgical History:  Procedure Laterality Date  . bilateral inguinal hernia  2008  . BUBBLE STUDY  01/28/2020   Procedure: BUBBLE STUDY;  Surgeon: [name], MD;  Location: [facility];  Service: Cardiovascular;;  . CYST REMOVAL TRUNK  2016   sebaceous cyst  . CYSTOSCOPY  04/22/2016   Procedure: CYSTOSCOPY;  Surgeon: [name], MD;  Location: [facility];  Service: Urology;;  . IR ANGIO INTRA EXTRACRAN SEL COM CAROTID INNOMINATE BILAT MOD SED  05/30/2020  . IR ANGIO VERTEBRAL SEL SUBCLAVIAN INNOMINATE UNI R MOD SED  01/25/2020  . IR ANGIO VERTEBRAL SEL VERTEBRAL BILAT MOD SED  05/30/2020  . IR CT HEAD LTD  01/25/2020  . IR PERCUTANEOUS ART THROMBECTOMY/INFUSION INTRACRANIAL INC DIAG ANGIO  01/25/2020  . IR US GUIDE VASC ACCESS RIGHT  05/30/2020  . KNEE ARTHROSCOPY  2007   right  . LOOP RECORDER INSERTION  N/A 01/28/2020   Procedure: LOOP RECORDER INSERTION;  Surgeon: [name], MD;  Location: [facility];  Service: Cardiovascular;  Laterality: N/A;  . PROSTATE BIOPSY  2008  . PROSTATE BIOPSY  2013  . PROSTATE BIOPSY  01/28/2016  . RADIOACTIVE SEED IMPLANT N/A 04/22/2016   Procedure: RADIOACTIVE SEED IMPLANT/BRACHYTHERAPY IMPLANT;  Surgeon: [name], MD;  Location: [facility];  Service: Urology;  Laterality: N/A;  . RADIOLOGY WITH ANESTHESIA N/A 01/24/2020   Procedure: IR WITH ANESTHESIA;  Surgeon: [name], MD;  Location: [facility];  Service: Radiology;  Laterality: N/A;  . TEE WITHOUT CARDIOVERSION N/A 01/28/2020   Procedure: TRANSESOPHAGEAL ECHOCARDIOGRAM (TEE);  Surgeon: [name], MD;  Location: [facility] ENDOSCOPY;  Service: Cardiovascular;  Laterality: N/A;  . TONSILLECTOMY    . TOTAL HIP ARTHROPLASTY  2001   left  . TOTAL HIP ARTHROPLASTY Right 01/18/2018   Procedure: RIGHT TOTAL HIP ARTHROPLASTY ANTERIOR APPROACH;  Surgeon: [name], MD;  [facility];  Service: Orthopedics;  Laterality: Right;    There were no vitals filed for this visit.   Subjective Assessment - 06/05/20 1404    Subjective  Pain in the Rt shoulder has been fine the last 2 days    Pertinent History PMH:  hyperlipidemia, hypertension, prostate cancer radioactive seed implant  2017, R total hip arthroplasty, loop recorder insertion    Limitations loop recorder    Patient Stated Goals get my L hand and fingers working    Currently in Pain? No/denies          Functional mid level reaching LUE to remove wooden dowel pegs from pegboard w/ mod drops (d/t decreased pincer grasp) and min difficulty. Pt then placing back in pegboard w/ therapist stabilizing peg upright for grasping, mod drops and max diffiuclty. Pt required extra time to perform task.  UBE x 8 min, level 3, Lt hand wrapped for reciprocal movement pattern.  NMES to Lt wrist and finger extensors x 10 min,  previous parameters.                         OT Short Term Goals - 04/22/20 1400      OT SHORT TERM GOAL #1   Title Pt will be independent with initial HEP--check STG 03/24/20    Time 4    Period Weeks    Status Achieved      OT SHORT TERM GOAL #2   Title Pt will verbalize understanding of proper positioning of LUE to prevent pain/injury and edema management techniques.    Time 4    Period Weeks    Status Achieved      OT SHORT TERM GOAL #3   Title Pt will be independent with splint wear/care for improved positioning.    Time 4    Period Weeks    Status Achieved      OT SHORT TERM GOAL #4   Title Pt will be able to use LUE as a stabilizer for simple ADL tasks.    Time 4    Period Weeks    Status On-going   04/22/20:  inconsistent     OT SHORT TERM GOAL #5   Title Pt will demo at least 25% gross finger flex in prep for functional grasp.    Baseline 4    Time 4    Period Weeks    Status Achieved   except index and thumb            OT Long Term Goals - 04/24/20 1734      OT LONG TERM GOAL #1   Title Pt will be independent with updated HEP.--check LTGs 04/23/20    Time 8    Period Weeks    Status On-going      OT LONG TERM GOAL #2   Title Pt will be able to use LUE as nondominant assist for ADLs at least 50% of the time.    Time 8    Period Weeks    Status On-going   04/22/20:  25% of the time     OT LONG TERM GOAL #3   Title Pt will be able to score at least 10 on box and blocks test with LUE for improved coordination for ADLs.    Time 8    Period Weeks    Status On-going   04/24/20:  4 blocks     OT LONG TERM GOAL #4   Title Pt will complete clothing fasteners mod I using AE prn.    Time 8    Period Weeks    Status On-going   04/24/20: unable to use Lt hand for this at this time     OT LONG TERM GOAL #5   Title Pt will demo improved attention/visual scanning to be  able to perform environmental scanning in busy enivornment with at least  95% accuracy for incr safety for community activities/driving    Baseline 02/06/90;  58% initially    Time 8    Period Weeks    Status On-going   04/24/20: 69% accuracy     OT LONG TERM GOAL #6   Title Pt will demo at least 75% gross finger flex/ext for grasp/release of objects.    Time 8    Period Weeks    Status On-going   04/22/20:  approx 75% extension approx 50% flexion (performance inconsistent at times)                Plan - 06/05/20 1427    Clinical Impression Statement Pt slowly progressing with LUE function and awareness. Pt limited distally with combined wrist ext, RD, thumb palmer abd, and grip/pinch strength for functional use    Occupational performance deficits (Please refer to evaluation for details): ADL's;IADL's;Leisure;Social Participation;Work    Marketing executive / Function / Physical Skills ADL;Dexterity;ROM;IADL;Sensation;Mobility;Strength;FMC;Coordination;Decreased knowledge of precautions;Decreased knowledge of use of DME;Pain;UE functional use;GMC;Proprioception    Cognitive Skills Attention;Memory;Perception;Safety Awareness    OT Frequency 2x / week    OT Duration 8 weeks    OT Treatment/Interventions Self-care/ADL training;Moist Heat;Fluidtherapy;DME and/or AE instruction;Splinting;Therapeutic activities;Contrast Bath;Aquatic Therapy;Ultrasound;Therapeutic exercise;Cognitive remediation/compensation;Visual/perceptual remediation/compensation;Passive range of motion;Functional Mobility Training;Neuromuscular education;Cryotherapy;Electrical Stimulation;Paraffin;Energy conservation;Manual Therapy;Patient/family education    Plan continue NMR and functional use LUE, estim, UBE    Consulted and Agree with Plan of Care Patient           Patient will benefit from skilled therapeutic intervention in order to improve the following deficits and impairments:   Body Structure / Function / Physical Skills: ADL, Dexterity, ROM, IADL, Sensation, Mobility, Strength, FMC,  Coordination, Decreased knowledge of precautions, Decreased knowledge of use of DME, Pain, UE functional use, GMC, Proprioception Cognitive Skills: Attention, Memory, Perception, Safety Awareness     Visit Diagnosis: Hemiplegia and hemiparesis following cerebral infarction affecting left non-dominant side (HCC)  Other symptoms and signs involving the nervous system  Other lack of coordination  Attention and concentration deficit    Problem List Patient Active Problem List   Diagnosis Date Noted  . Left hemiparesis ([place]) 03/28/2020  . Chronic bilateral low back pain without sciatica 02/25/2020  . Spasticity as late effect of cerebrovascular accident (CVA) 02/25/2020  . Cognitive and neurobehavioral dysfunction   . Hypertensive urgency 01/28/2020  . Urinary retention 01/28/2020  . Right middle cerebral artery stroke ([place]) 01/28/2020  . Stroke (cerebrum) ([place]) -  [name] 01/25/2020  . Acute respiratory failure with hypoxia ([place])   . Hypotension   . Hypokalemia   . Hypocalcemia   . Encephalopathy acute   . Stroke ([place]) 01/24/2020  . Middle cerebral artery embolism, right 01/24/2020  . Hyperlipidemia 02/21/2019  . Impingement syndrome of right shoulder region 02/21/2018  . OA (osteoarthritis) of hip 01/18/2018  . Prostate cancer ([place]) 03/04/2016  . Essential hypertension 11/27/2014  . History of colonic polyps 09/26/2014  . BACK PAIN, LEFT 12/22/2009  . PROSTATE SPECIFIC ANTIGEN, ELEVATED 12/20/2008  . LATERAL EPICONDYLITIS 07/15/2008  . ACTINIC KERATOSIS, FOREHEAD, LEFT 05/23/2008  . WART, LEFT HAND 07/19/2007  . ERECTILE DYSFUNCTION, MILD 07/19/2007  . HERNIA, BILATERAL INGUINAL W/O OBST/GANGRENE 07/19/2007  . MENISCUS TEAR 07/19/2007  . Bilateral inguinal hernia 07/19/2007    Carey Bullocks, OTR/L 06/05/2020,  2:29 PM  [facility] [address] [place], [place], [address] Phone: [phone]   Fax:  [phone]  Name: ETHAN CLAYBURN MRN: [id number] Date of Birth: [birth year]

## 2020-06-06 ENCOUNTER — Ambulatory Visit (INDEPENDENT_AMBULATORY_CARE_PROVIDER_SITE_OTHER): Payer: PPO | Admitting: *Deleted

## 2020-06-06 DIAGNOSIS — I63519 Cerebral infarction due to unspecified occlusion or stenosis of unspecified middle cerebral artery: Secondary | ICD-10-CM | POA: Diagnosis not present

## 2020-06-06 LAB — CUP PACEART REMOTE DEVICE CHECK
Date Time Interrogation Session: 20210701230500
Implantable Pulse Generator Implant Date: 20210222

## 2020-06-08 ENCOUNTER — Telehealth (HOSPITAL_COMMUNITY): Payer: Self-pay | Admitting: Radiology

## 2020-06-08 NOTE — Telephone Encounter (Signed)
Called pt, left VM. Returning pt's call. JM

## 2020-06-10 ENCOUNTER — Telehealth (HOSPITAL_COMMUNITY): Payer: Self-pay

## 2020-06-10 NOTE — Progress Notes (Signed)
Carelink Summary Report / Loop Recorder 

## 2020-06-10 NOTE — Telephone Encounter (Signed)
Returned pt's call, no answer, left vm. AW  

## 2020-06-16 ENCOUNTER — Ambulatory Visit: Payer: PPO | Admitting: Occupational Therapy

## 2020-06-18 ENCOUNTER — Encounter: Payer: PPO | Admitting: Occupational Therapy

## 2020-06-23 ENCOUNTER — Encounter: Payer: PPO | Admitting: Occupational Therapy

## 2020-06-25 ENCOUNTER — Encounter: Payer: PPO | Admitting: Occupational Therapy

## 2020-06-30 ENCOUNTER — Ambulatory Visit: Payer: PPO | Admitting: Occupational Therapy

## 2020-07-02 ENCOUNTER — Encounter: Payer: PPO | Admitting: Occupational Therapy

## 2020-07-03 ENCOUNTER — Ambulatory Visit: Payer: PPO | Admitting: Adult Health

## 2020-07-08 ENCOUNTER — Other Ambulatory Visit: Payer: Self-pay

## 2020-07-08 ENCOUNTER — Ambulatory Visit: Payer: PPO | Attending: Physician Assistant | Admitting: Occupational Therapy

## 2020-07-08 DIAGNOSIS — R29818 Other symptoms and signs involving the nervous system: Secondary | ICD-10-CM | POA: Diagnosis not present

## 2020-07-08 DIAGNOSIS — R278 Other lack of coordination: Secondary | ICD-10-CM | POA: Diagnosis not present

## 2020-07-08 DIAGNOSIS — M6281 Muscle weakness (generalized): Secondary | ICD-10-CM | POA: Diagnosis not present

## 2020-07-08 DIAGNOSIS — I69354 Hemiplegia and hemiparesis following cerebral infarction affecting left non-dominant side: Secondary | ICD-10-CM | POA: Insufficient documentation

## 2020-07-08 NOTE — Therapy (Signed)
[facility] [address] [place], [place], [address] Phone: [phone]   Fax:  [phone]  Occupational Therapy Treatment  Patient Details  Name: Ruben Chavez MRN: [id number] Date of Birth: 1951/08/31 Referring Provider (OT): Lauraine Rinne, PA-C   Encounter Date: 07/08/2020   OT End of Session - 07/08/20 1344    Visit Number 28    Number of Visits 33    Date for OT Re-Evaluation 07/20/20    Authorization Type [employer] (follow Medicare)--1 copay for multiple visits    Authorization Time Period cert date 7/98 - 08/26/18    Authorization - Visit Number 90    Authorization - Number of Visits 30    Progress Note Due on Visit 30    OT Start Time 4174    OT Stop Time 1315    OT Time Calculation (min) 40 min    Activity Tolerance Patient tolerated treatment well    Behavior During Therapy [facility] for tasks assessed/performed           Past Medical History:  Diagnosis Date  . Elevated PSA   . Hyperlipidemia    pt says not anymore  . Hypertension   . Prostate cancer ([place]) 2013    Past Surgical History:  Procedure Laterality Date  . bilateral inguinal hernia  2008  . BUBBLE STUDY  01/28/2020   Procedure: BUBBLE STUDY;  Surgeon: [name], MD;  Location: [facility];  Service: Cardiovascular;;  . CYST REMOVAL TRUNK  2016   sebaceous cyst  . CYSTOSCOPY  04/22/2016   Procedure: CYSTOSCOPY;  Surgeon: [name], MD;  Location: [facility];  Service: Urology;;  . IR ANGIO INTRA EXTRACRAN SEL COM CAROTID INNOMINATE BILAT MOD SED  05/30/2020  . IR ANGIO VERTEBRAL SEL SUBCLAVIAN INNOMINATE UNI R MOD SED  01/25/2020  . IR ANGIO VERTEBRAL SEL VERTEBRAL BILAT MOD SED  05/30/2020  . IR CT HEAD LTD  01/25/2020  . IR PERCUTANEOUS ART THROMBECTOMY/INFUSION INTRACRANIAL INC DIAG ANGIO  01/25/2020  . IR US GUIDE VASC ACCESS RIGHT  05/30/2020  . KNEE ARTHROSCOPY  2007   right  . LOOP RECORDER INSERTION  N/A 01/28/2020   Procedure: LOOP RECORDER INSERTION;  Surgeon: [name], MD;  Location: [facility];  Service: Cardiovascular;  Laterality: N/A;  . PROSTATE BIOPSY  2008  . PROSTATE BIOPSY  2013  . PROSTATE BIOPSY  01/28/2016  . RADIOACTIVE SEED IMPLANT N/A 04/22/2016   Procedure: RADIOACTIVE SEED IMPLANT/BRACHYTHERAPY IMPLANT;  Surgeon: [name], MD;  Location: [facility];  Service: Urology;  Laterality: N/A;  . RADIOLOGY WITH ANESTHESIA N/A 01/24/2020   Procedure: IR WITH ANESTHESIA;  Surgeon: [name], MD;  Location: [facility];  Service: Radiology;  Laterality: N/A;  . TEE WITHOUT CARDIOVERSION N/A 01/28/2020   Procedure: TRANSESOPHAGEAL ECHOCARDIOGRAM (TEE);  Surgeon: [name], MD;  Location: [facility] ENDOSCOPY;  Service: Cardiovascular;  Laterality: N/A;  . TONSILLECTOMY    . TOTAL HIP ARTHROPLASTY  2001   left  . TOTAL HIP ARTHROPLASTY Right 01/18/2018   Procedure: RIGHT TOTAL HIP ARTHROPLASTY ANTERIOR APPROACH;  Surgeon: [name], MD;  Location: [facility];  Service: Orthopedics;  Laterality: Right;    There were no vitals filed for this visit.   Subjective Assessment - 07/08/20 1343    Subjective  Do you think I will get any better since I'm almost at the 6 month mark?    Pertinent History PMH:  hyperlipidemia, hypertension, prostate  cancer radioactive seed implant 2017, R total hip arthroplasty, loop recorder insertion    Limitations loop recorder    Patient Stated Goals get my L hand and fingers working    Currently in Pain? No/denies           Pt had questions re: prognosis as he approaches the 6 month mark post stroke - therapist explained that the greatest amount of progress happens within the first 6 months up to a year.  Pt also had questions about spasticity management  - discussed at length concerns about typical spasticity management due to limitations both with finger flexion and finger extension (extension appears more to  be spasticity related) however flexion d/t stiffness and decreased motion. Encouraged pt to discuss further with neurologist  Began discussing need for A/E and one handed techniques for certain tasks as it may not be safe to use LUE for specific tasks. Pt shown one handed cutting board and issued handout. Pt already has rocker knife and ordering equipment for turn signals with car. Pt has elastic shoelaces for tennis shoes which he reports work well but wanted to try shoe buttons for a different style shoe - to bring in next session. Pt also reports he can open jars w/ modifications and cans with electric can opener.  UBE x 5 min, Lt hand wrapped. Provided new strap for thumb splint.                        OT Short Term Goals - 04/22/20 1400      OT SHORT TERM GOAL #1   Title Pt will be independent with initial HEP--check STG 03/24/20    Time 4    Period Weeks    Status Achieved      OT SHORT TERM GOAL #2   Title Pt will verbalize understanding of proper positioning of LUE to prevent pain/injury and edema management techniques.    Time 4    Period Weeks    Status Achieved      OT SHORT TERM GOAL #3   Title Pt will be independent with splint wear/care for improved positioning.    Time 4    Period Weeks    Status Achieved      OT SHORT TERM GOAL #4   Title Pt will be able to use LUE as a stabilizer for simple ADL tasks.    Time 4    Period Weeks    Status On-going   04/22/20:  inconsistent     OT SHORT TERM GOAL #5   Title Pt will demo at least 25% gross finger flex in prep for functional grasp.    Baseline 4    Time 4    Period Weeks    Status Achieved   except index and thumb            OT Long Term Goals - 04/24/20 1734      OT LONG TERM GOAL #1   Title Pt will be independent with updated HEP.--check LTGs 04/23/20    Time 8    Period Weeks    Status On-going      OT LONG TERM GOAL #2   Title Pt will be able to use LUE as nondominant assist for ADLs  at least 50% of the time.    Time 8    Period Weeks    Status On-going   04/22/20:  25% of the time     OT LONG  TERM GOAL #3   Title Pt will be able to score at least 10 on box and blocks test with LUE for improved coordination for ADLs.    Time 8    Period Weeks    Status On-going   04/24/20:  4 blocks     OT LONG TERM GOAL #4   Title Pt will complete clothing fasteners mod I using AE prn.    Time 8    Period Weeks    Status On-going   04/24/20: unable to use Lt hand for this at this time     OT LONG TERM GOAL #5   Title Pt will demo improved attention/visual scanning to be able to perform environmental scanning in busy enivornment with at least 95% accuracy for incr safety for community activities/driving    Baseline 12/11/94;  58% initially    Time 8    Period Weeks    Status On-going   04/24/20: 69% accuracy     OT LONG TERM GOAL #6   Title Pt will demo at least 75% gross finger flex/ext for grasp/release of objects.    Time 8    Period Weeks    Status On-going   04/22/20:  approx 75% extension approx 50% flexion (performance inconsistent at times)                Plan - 07/08/20 1345    Clinical Impression Statement Pt returns today after being on vacation for a couple weeks. Pt had concerns about proress and spasticity management but demo more anticipatory awareness    Occupational performance deficits (Please refer to evaluation for details): ADL's;IADL's;Leisure;Social Participation;Work    Marketing executive / Function / Physical Skills ADL;Dexterity;ROM;IADL;Sensation;Mobility;Strength;FMC;Coordination;Decreased knowledge of precautions;Decreased knowledge of use of DME;Pain;UE functional use;GMC;Proprioception    Cognitive Skills Attention;Memory;Perception;Safety Awareness    OT Frequency 2x / week    OT Duration 8 weeks    OT Treatment/Interventions Self-care/ADL training;Moist Heat;Fluidtherapy;DME and/or AE instruction;Splinting;Therapeutic activities;Contrast  Bath;Aquatic Therapy;Ultrasound;Therapeutic exercise;Cognitive remediation/compensation;Visual/perceptual remediation/compensation;Passive range of motion;Functional Mobility Training;Neuromuscular education;Cryotherapy;Electrical Stimulation;Paraffin;Energy conservation;Manual Therapy;Patient/family education    Plan Pt to bring in shoes to practice A/E with if able, therapist to send message to neurology re: pt's concerns, begin assessing LTG's in prep for d/c next week    Consulted and Agree with Plan of Care Patient           Patient will benefit from skilled therapeutic intervention in order to improve the following deficits and impairments:   Body Structure / Function / Physical Skills: ADL, Dexterity, ROM, IADL, Sensation, Mobility, Strength, FMC, Coordination, Decreased knowledge of precautions, Decreased knowledge of use of DME, Pain, UE functional use, GMC, Proprioception Cognitive Skills: Attention, Memory, Perception, Safety Awareness     Visit Diagnosis: Hemiplegia and hemiparesis following cerebral infarction affecting left non-dominant side (HCC)  Other lack of coordination    Problem List Patient Active Problem List   Diagnosis Date Noted  . Left hemiparesis ([place]) 03/28/2020  . Chronic bilateral low back pain without sciatica 02/25/2020  . Spasticity as late effect of cerebrovascular accident (CVA) 02/25/2020  . Cognitive and neurobehavioral dysfunction   . Hypertensive urgency 01/28/2020  . Urinary retention 01/28/2020  . Right middle cerebral artery stroke ([place]) 01/28/2020  . Stroke (cerebrum) ([place]) -  [name] 01/25/2020  . Acute respiratory failure with hypoxia ([place])   . Hypotension   . Hypokalemia   .  Hypocalcemia   . Encephalopathy acute   . Stroke ([place]) 01/24/2020  . Middle cerebral artery embolism, right 01/24/2020  . Hyperlipidemia 02/21/2019  .  Impingement syndrome of right shoulder region 02/21/2018  . OA (osteoarthritis) of hip 01/18/2018  . Prostate cancer ([place]) 03/04/2016  . Essential hypertension 11/27/2014  . History of colonic polyps 09/26/2014  . BACK PAIN, LEFT 12/22/2009  . PROSTATE SPECIFIC ANTIGEN, ELEVATED 12/20/2008  . LATERAL EPICONDYLITIS 07/15/2008  . ACTINIC KERATOSIS, FOREHEAD, LEFT 05/23/2008  . WART, LEFT HAND 07/19/2007  . ERECTILE DYSFUNCTION, MILD 07/19/2007  . HERNIA, BILATERAL INGUINAL W/O OBST/GANGRENE 07/19/2007  . MENISCUS TEAR 07/19/2007  . Bilateral inguinal hernia 07/19/2007    Carey Bullocks, OTR/L 07/08/2020, 2:12 PM  [facility] [address] [place], [place], [address] Phone: [phone]   Fax:  [phone]  Name: Ruben Chavez MRN: [id number] Date of Birth: 06-24-1951

## 2020-07-09 ENCOUNTER — Telehealth: Payer: Self-pay

## 2020-07-09 ENCOUNTER — Ambulatory Visit: Payer: PPO | Admitting: Occupational Therapy

## 2020-07-09 DIAGNOSIS — R29818 Other symptoms and signs involving the nervous system: Secondary | ICD-10-CM

## 2020-07-09 DIAGNOSIS — M6281 Muscle weakness (generalized): Secondary | ICD-10-CM

## 2020-07-09 DIAGNOSIS — I69354 Hemiplegia and hemiparesis following cerebral infarction affecting left non-dominant side: Secondary | ICD-10-CM

## 2020-07-09 DIAGNOSIS — R278 Other lack of coordination: Secondary | ICD-10-CM

## 2020-07-09 NOTE — Telephone Encounter (Signed)
Hello Ruben Chavez,   [name] had questions about spasticity management for Lt hand, however I was concerned about possible botox because despite his difficulty with fully opening hand due to flexor spasticity, he also has a difficult time flexing hand d/t motor weakness and stiffness. I thought if finger flexors were botoxed, it may make closing/flexing hand even more difficult. However, pt may benefit from botox to wrist flexors only. See what you think when you see him next. I encouraged him to discuss with you.  Also, we will be wrapping up O.T. next week as he is starting to plateau. I encouraged him to return in 3-6 months if anything changes with LUE status.  Thanks,  [employer]

## 2020-07-09 NOTE — Therapy (Signed)
[facility] [address] [place], [place], [address] Phone: [phone]   Fax:  [phone]  Occupational Therapy Treatment  Patient Details  Name: Ruben Chavez MRN: [id number] Date of Birth: 16-Oct-1951 Referring Provider (OT): Lauraine Rinne, PA-C   Encounter Date: 07/09/2020   OT End of Session - 07/09/20 1406    Visit Number 29    Number of Visits 33    Date for OT Re-Evaluation 07/20/20    Authorization Type [employer] (follow Medicare)--1 copay for multiple visits    Authorization Time Period cert date 9/48 - 5/46/27    Authorization - Visit Number 2    Authorization - Number of Visits 30    Progress Note Due on Visit 30    OT Start Time 1315    OT Stop Time 1400    OT Time Calculation (min) 45 min    Activity Tolerance Patient tolerated treatment well    Behavior During Therapy [facility] for tasks assessed/performed           Past Medical History:  Diagnosis Date  . Elevated PSA   . Hyperlipidemia    pt says not anymore  . Hypertension   . Prostate cancer ([place]) 2013    Past Surgical History:  Procedure Laterality Date  . bilateral inguinal hernia  2008  . BUBBLE STUDY  01/28/2020   Procedure: BUBBLE STUDY;  Surgeon: [name], MD;  Location: [facility];  Service: Cardiovascular;;  . CYST REMOVAL TRUNK  2016   sebaceous cyst  . CYSTOSCOPY  04/22/2016   Procedure: CYSTOSCOPY;  Surgeon: [name], MD;  Location: [facility];  Service: Urology;;  . IR ANGIO INTRA EXTRACRAN SEL COM CAROTID INNOMINATE BILAT MOD SED  05/30/2020  . IR ANGIO VERTEBRAL SEL SUBCLAVIAN INNOMINATE UNI R MOD SED  01/25/2020  . IR ANGIO VERTEBRAL SEL VERTEBRAL BILAT MOD SED  05/30/2020  . IR CT HEAD LTD  01/25/2020  . IR PERCUTANEOUS ART THROMBECTOMY/INFUSION INTRACRANIAL INC DIAG ANGIO  01/25/2020  . IR US GUIDE VASC ACCESS RIGHT  05/30/2020  . KNEE ARTHROSCOPY  2007   right  . LOOP RECORDER INSERTION  N/A 01/28/2020   Procedure: LOOP RECORDER INSERTION;  Surgeon: [name], MD;  Location: [facility];  Service: Cardiovascular;  Laterality: N/A;  . PROSTATE BIOPSY  2008  . PROSTATE BIOPSY  2013  . PROSTATE BIOPSY  01/28/2016  . RADIOACTIVE SEED IMPLANT N/A 04/22/2016   Procedure: RADIOACTIVE SEED IMPLANT/BRACHYTHERAPY IMPLANT;  Surgeon: [name], MD;  Location: [facility];  Service: Urology;  Laterality: N/A;  . RADIOLOGY WITH ANESTHESIA N/A 01/24/2020   Procedure: IR WITH ANESTHESIA;  Surgeon: [name], MD;  Location: [facility];  Service: Radiology;  Laterality: N/A;  . TEE WITHOUT CARDIOVERSION N/A 01/28/2020   Procedure: TRANSESOPHAGEAL ECHOCARDIOGRAM (TEE);  Surgeon: [name], MD;  Location: [facility] ENDOSCOPY;  Service: Cardiovascular;  Laterality: N/A;  . TONSILLECTOMY    . TOTAL HIP ARTHROPLASTY  2001   left  . TOTAL HIP ARTHROPLASTY Right 01/18/2018   Procedure: RIGHT TOTAL HIP ARTHROPLASTY ANTERIOR APPROACH;  Surgeon: [name], MD;  [facility];  Service: Orthopedics;  Laterality: Right;    There were no vitals filed for this visit.   Subjective Assessment - 07/09/20 1321    Subjective  Do you think I will get any better since I'm almost at the 6 month mark?    Pertinent History PMH:  hyperlipidemia, hypertension, prostate  cancer radioactive seed implant 2017, R total hip arthroplasty, loop recorder insertion    Limitations loop recorder    Patient Stated Goals get my L hand and fingers working    Currently in Pain? No/denies          Began assessing remaining STG and LTG's. Box & Blocks Lt = 10,  then 11  Functional reaching and grasping/releasing 1" and 2" diameter cones - pt required stabilizing cone with Rt hand to grasp w/ Lt hand and to prevent knocking cone over - worked on combined movements of grasping, then wrist extension and finger extension to release cone on flat surface. Pt required min facilitation and 2  finger assist distally ulnar side of wrist/hand to perform with cues also to use correct movement patterns.  NMES to wrist/finger extensors (electrodes placed more radially) x 10 min at 50 pps, 250 pw, 10 sec on/off cycle.  Inbasket message sent to neurology re: spasticity management questions                        OT Short Term Goals - 07/09/20 1407      OT SHORT TERM GOAL #1   Title Pt will be independent with initial HEP--check STG 03/24/20    Time 4    Period Weeks    Status Achieved      OT SHORT TERM GOAL #2   Title Pt will verbalize understanding of proper positioning of LUE to prevent pain/injury and edema management techniques.    Time 4    Period Weeks    Status Achieved      OT SHORT TERM GOAL #3   Title Pt will be independent with splint wear/care for improved positioning.    Time 4    Period Weeks    Status Achieved      OT SHORT TERM GOAL #4   Title Pt will be able to use LUE as a stabilizer for simple ADL tasks.    Time 4    Period Weeks    Status Achieved      OT SHORT TERM GOAL #5   Title Pt will demo at least 25% gross finger flex in prep for functional grasp.    Baseline 4    Time 4    Period Weeks    Status Achieved   except index and thumb            OT Long Term Goals - 07/09/20 1407      OT LONG TERM GOAL #1   Title Pt will be independent with updated HEP.--check LTGs 04/23/20    Time 8    Period Weeks    Status On-going      OT LONG TERM GOAL #2   Title Pt will be able to use LUE as nondominant assist for ADLs at least 50% of the time.    Time 8    Period Weeks    Status Not Met   25% of the time     OT LONG TERM GOAL #3   Title Pt will be able to score at least 10 on box and blocks test with LUE for improved coordination for ADLs.    Time 8    Period Weeks    Status Achieved   07/09/20: 10 blocks (11 blocks on 2nd trial)     OT LONG TERM GOAL #4   Title Pt will complete clothing fasteners mod I using AE prn.  Time 8    Period Weeks    Status Achieved   Rt hand mostly, A/E for shoes     OT LONG TERM GOAL #5   Title Pt will demo improved attention/visual scanning to be able to perform environmental scanning in busy enivornment with at least 95% accuracy for incr safety for community activities/driving    Baseline 12/11/08;  58% initially    Time 8    Period Weeks    Status On-going   04/24/20: 69% accuracy     OT LONG TERM GOAL #6   Title Pt will demo at least 75% gross finger flex/ext for grasp/release of objects.    Time 8    Period Weeks    Status Not Met   approx 75% extension approx 50% flexion (performance inconsistent at times)                Plan - 07/09/20 1409    Clinical Impression Statement See goal section for updates/progress.    Occupational performance deficits (Please refer to evaluation for details): ADL's;IADL's;Leisure;Social Participation;Work    Marketing executive / Function / Physical Skills ADL;Dexterity;ROM;IADL;Sensation;Mobility;Strength;FMC;Coordination;Decreased knowledge of precautions;Decreased knowledge of use of DME;Pain;UE functional use;GMC;Proprioception    Cognitive Skills Attention;Memory;Perception;Safety Awareness    OT Frequency 2x / week    OT Duration 8 weeks    OT Treatment/Interventions Self-care/ADL training;Moist Heat;Fluidtherapy;DME and/or AE instruction;Splinting;Therapeutic activities;Contrast Bath;Aquatic Therapy;Ultrasound;Therapeutic exercise;Cognitive remediation/compensation;Visual/perceptual remediation/compensation;Passive range of motion;Functional Mobility Training;Neuromuscular education;Cryotherapy;Electrical Stimulation;Paraffin;Energy conservation;Manual Therapy;Patient/family education    Plan Assess remaining LTG's, continue NMR, UBE, estim, d/c end of next week    Consulted and Agree with Plan of Care Patient           Patient will benefit from skilled therapeutic intervention in order to improve the following deficits and  impairments:   Body Structure / Function / Physical Skills: ADL, Dexterity, ROM, IADL, Sensation, Mobility, Strength, FMC, Coordination, Decreased knowledge of precautions, Decreased knowledge of use of DME, Pain, UE functional use, GMC, Proprioception Cognitive Skills: Attention, Memory, Perception, Safety Awareness     Visit Diagnosis: Hemiplegia and hemiparesis following cerebral infarction affecting left non-dominant side (HCC)  Other lack of coordination  Other symptoms and signs involving the nervous system  Muscle weakness (generalized)    Problem List Patient Active Problem List   Diagnosis Date Noted  . Left hemiparesis ([place]) 03/28/2020  . Chronic bilateral low back pain without sciatica 02/25/2020  . Spasticity as late effect of cerebrovascular accident (CVA) 02/25/2020  . Cognitive and neurobehavioral dysfunction   . Hypertensive urgency 01/28/2020  . Urinary retention 01/28/2020  . Right middle cerebral artery stroke ([place]) 01/28/2020  . Stroke (cerebrum) ([place]) -  [name] 01/25/2020  . Acute respiratory failure with hypoxia ([place])   . Hypotension   . Hypokalemia   . Hypocalcemia   . Encephalopathy acute   . Stroke ([place]) 01/24/2020  . Middle cerebral artery embolism, right 01/24/2020  . Hyperlipidemia 02/21/2019  . Impingement syndrome of right shoulder region 02/21/2018  . OA (osteoarthritis) of hip 01/18/2018  . Prostate cancer ([place]) 03/04/2016  . Essential hypertension 11/27/2014  . History of colonic polyps 09/26/2014  . BACK PAIN, LEFT 12/22/2009  . PROSTATE SPECIFIC ANTIGEN, ELEVATED 12/20/2008  . LATERAL EPICONDYLITIS 07/15/2008  . ACTINIC KERATOSIS, FOREHEAD, LEFT 05/23/2008  . WART, LEFT HAND 07/19/2007  . ERECTILE DYSFUNCTION, MILD 07/19/2007  . HERNIA, BILATERAL INGUINAL W/O  OBST/GANGRENE 07/19/2007  . MENISCUS TEAR 07/19/2007  . Bilateral  inguinal hernia 07/19/2007    Carey Bullocks, OTR/L 07/09/2020, 2:11 PM  [facility] [address] [place], [place], [address] Phone: [phone]   Fax:  [phone]  Name: SHRAY HUNLEY MRN: [id number] Date of Birth: [birth year]

## 2020-07-10 LAB — CUP PACEART REMOTE DEVICE CHECK
Date Time Interrogation Session: 20210803230359
Implantable Pulse Generator Implant Date: 20210222

## 2020-07-14 ENCOUNTER — Ambulatory Visit (INDEPENDENT_AMBULATORY_CARE_PROVIDER_SITE_OTHER): Payer: PPO | Admitting: *Deleted

## 2020-07-14 ENCOUNTER — Telehealth: Payer: Self-pay | Admitting: Internal Medicine

## 2020-07-14 DIAGNOSIS — I63519 Cerebral infarction due to unspecified occlusion or stenosis of unspecified middle cerebral artery: Secondary | ICD-10-CM | POA: Diagnosis not present

## 2020-07-14 NOTE — Telephone Encounter (Signed)
Pt is calling in stating that he thought that he was to stop Plavix after 3 months after getting out of the hospital and only take a aspirin 325 MG.  Pt stated that he is taking both and would like to have a call back to discuss what he should do about the two medications.

## 2020-07-14 NOTE — Progress Notes (Signed)
Carelink Summary Report / Loop Recorder 

## 2020-07-15 ENCOUNTER — Other Ambulatory Visit: Payer: Self-pay | Admitting: *Deleted

## 2020-07-15 ENCOUNTER — Other Ambulatory Visit: Payer: Self-pay

## 2020-07-15 ENCOUNTER — Telehealth: Payer: Self-pay | Admitting: Internal Medicine

## 2020-07-15 ENCOUNTER — Ambulatory Visit: Payer: PPO | Admitting: Occupational Therapy

## 2020-07-15 DIAGNOSIS — I69354 Hemiplegia and hemiparesis following cerebral infarction affecting left non-dominant side: Secondary | ICD-10-CM

## 2020-07-15 DIAGNOSIS — R278 Other lack of coordination: Secondary | ICD-10-CM

## 2020-07-15 DIAGNOSIS — M6281 Muscle weakness (generalized): Secondary | ICD-10-CM

## 2020-07-15 DIAGNOSIS — R29818 Other symptoms and signs involving the nervous system: Secondary | ICD-10-CM

## 2020-07-15 NOTE — Telephone Encounter (Signed)
He was seen back in June and reported that he had discontinued clopidogrel as he was only on for 3 months.  He was only on aspirin at that time.  Appears as though clopidogrel added back on his medication list at the end of July for unknown reasons.  He should not be on Plavix in addition to aspirin from a neurological standpoint.  He should be on aspirin alone at this time.

## 2020-07-15 NOTE — Telephone Encounter (Signed)
Patient would like to know if he should stop the Plavix

## 2020-07-15 NOTE — Telephone Encounter (Signed)
Left message on machine returning patient's call 

## 2020-07-15 NOTE — Telephone Encounter (Signed)
Pt call and want a call back as soon as you can.

## 2020-07-15 NOTE — Therapy (Signed)
[facility] [address] [place], [place], [address] Phone: [phone]   Fax:  [phone]  Occupational Therapy Treatment  Patient Details  Name: Ruben Chavez MRN: [id number] Date of Birth: 1951-01-01 Referring Provider (OT): Lauraine Rinne, PA-C   Encounter Date: 07/15/2020   OT End of Session - 07/15/20 1137    Visit Number 30    Number of Visits 33    Date for OT Re-Evaluation 07/20/20    Authorization Type [employer] (follow Medicare)--1 copay for multiple visits    Authorization Time Period cert date 8/45 - 3/64/68    Authorization - Visit Number 70    Authorization - Number of Visits 30    Progress Note Due on Visit 30    OT Start Time 1105    OT Stop Time 1150    OT Time Calculation (min) 45 min    Activity Tolerance Patient tolerated treatment well    Behavior During Therapy [facility] for tasks assessed/performed           Past Medical History:  Diagnosis Date  . Elevated PSA   . Hyperlipidemia    pt says not anymore  . Hypertension   . Prostate cancer ([place]) 2013    Past Surgical History:  Procedure Laterality Date  . bilateral inguinal hernia  2008  . BUBBLE STUDY  01/28/2020   Procedure: BUBBLE STUDY;  Surgeon: [name], MD;  Location: [facility];  Service: Cardiovascular;;  . CYST REMOVAL TRUNK  2016   sebaceous cyst  . CYSTOSCOPY  04/22/2016   Procedure: CYSTOSCOPY;  Surgeon: [name], MD;  Location: [facility];  Service: Urology;;  . IR ANGIO INTRA EXTRACRAN SEL COM CAROTID INNOMINATE BILAT MOD SED  05/30/2020  . IR ANGIO VERTEBRAL SEL SUBCLAVIAN INNOMINATE UNI R MOD SED  01/25/2020  . IR ANGIO VERTEBRAL SEL VERTEBRAL BILAT MOD SED  05/30/2020  . IR CT HEAD LTD  01/25/2020  . IR PERCUTANEOUS ART THROMBECTOMY/INFUSION INTRACRANIAL INC DIAG ANGIO  01/25/2020  . IR US GUIDE VASC ACCESS RIGHT  05/30/2020  . KNEE ARTHROSCOPY  2007   right  . LOOP RECORDER  INSERTION N/A 01/28/2020   Procedure: LOOP RECORDER INSERTION;  Surgeon: [name], MD;  Location: [facility];  Service: Cardiovascular;  Laterality: N/A;  . PROSTATE BIOPSY  2008  . PROSTATE BIOPSY  2013  . PROSTATE BIOPSY  01/28/2016  . RADIOACTIVE SEED IMPLANT N/A 04/22/2016   Procedure: RADIOACTIVE SEED IMPLANT/BRACHYTHERAPY IMPLANT;  Surgeon: [name], MD;  Location: [facility];  Service: Urology;  Laterality: N/A;  . RADIOLOGY WITH ANESTHESIA N/A 01/24/2020   Procedure: IR WITH ANESTHESIA;  Surgeon: [name], MD;  Location: [facility];  Service: Radiology;  Laterality: N/A;  . TEE WITHOUT CARDIOVERSION N/A 01/28/2020   Procedure: TRANSESOPHAGEAL ECHOCARDIOGRAM (TEE);  Surgeon: [name], MD;  Location: [facility];  Service: Cardiovascular;  Laterality: N/A;  . TONSILLECTOMY    . TOTAL HIP ARTHROPLASTY  2001   left  . TOTAL HIP ARTHROPLASTY Right 01/18/2018   Procedure: RIGHT TOTAL HIP ARTHROPLASTY ANTERIOR APPROACH;  Surgeon: [name], MD;  [facility]: [facility];  Service: Orthopedics;  Laterality: Right;    There were no vitals filed for this visit.   Subjective Assessment - 07/15/20 1112    Pertinent History PMH:  hyperlipidemia, hypertension, prostate cancer radioactive seed implant 2017, R total hip arthroplasty, loop recorder insertion    Limitations loop recorder  Patient Stated Goals get my L hand and fingers working    Currently in Pain? No/denies           Pt moving wooden dowel pegs over in pegboard  (already upright) and removing from pegboard Lt hand with min drops. Needs assist to place back in pegboard holding pegs upright.  UBE x 10 min with Lt hand wrapped for reciprocal movement pattern. NMES x 10 min to wrist/finger extensors and radial deviation (previous parameters).  Pt shown table bike to order if interested                       OT Short Term Goals - 07/09/20 1407      OT SHORT TERM  GOAL #1   Title Pt will be independent with initial HEP--check STG 03/24/20    Time 4    Period Weeks    Status Achieved      OT SHORT TERM GOAL #2   Title Pt will verbalize understanding of proper positioning of LUE to prevent pain/injury and edema management techniques.    Time 4    Period Weeks    Status Achieved      OT SHORT TERM GOAL #3   Title Pt will be independent with splint wear/care for improved positioning.    Time 4    Period Weeks    Status Achieved      OT SHORT TERM GOAL #4   Title Pt will be able to use LUE as a stabilizer for simple ADL tasks.    Time 4    Period Weeks    Status Achieved      OT SHORT TERM GOAL #5   Title Pt will demo at least 25% gross finger flex in prep for functional grasp.    Baseline 4    Time 4    Period Weeks    Status Achieved   except index and thumb            OT Long Term Goals - 07/15/20 1114      OT LONG TERM GOAL #1   Title Pt will be independent with updated HEP.--check LTGs 04/23/20    Time 8    Period Weeks    Status Deferred   unable to upgrade     OT LONG TERM GOAL #2   Title Pt will be able to use LUE as nondominant assist for ADLs at least 50% of the time.    Time 8    Period Weeks    Status Not Met   25% of the time     OT LONG TERM GOAL #3   Title Pt will be able to score at least 10 on box and blocks test with LUE for improved coordination for ADLs.    Time 8    Period Weeks    Status Achieved   07/09/20: 10 blocks (11 blocks on 2nd trial)     OT LONG TERM GOAL #4   Title Pt will complete clothing fasteners mod I using AE prn.    Time 8    Period Weeks    Status Achieved   Rt hand mostly, A/E for shoes     OT LONG TERM GOAL #5   Title Pt will demo improved attention/visual scanning to be able to perform environmental scanning in busy enivornment with at least 95% accuracy for incr safety for community activities/driving    Baseline 01/10/62;  58% initially  Time 8    Period Weeks    Status  On-going   04/24/20: 69% accuracy     OT LONG TERM GOAL #6   Title Pt will demo at least 75% gross finger flex/ext for grasp/release of objects.    Time 8    Period Weeks    Status Partially Met   approx 75% extension approx 50% flexion (performance inconsistent at times)                Plan - 07/15/20 1115    Clinical Impression Statement This 10th progress note is for dates 05/07/20 to 07/15/20: See goal section for updates/progress. Pt has plateued in progress LUE.  D/C next session    Occupational performance deficits (Please refer to evaluation for details): ADL's;IADL's;Leisure;Social Participation;Work    Marketing executive / Function / Physical Skills ADL;Dexterity;ROM;IADL;Sensation;Mobility;Strength;FMC;Coordination;Decreased knowledge of precautions;Decreased knowledge of use of DME;Pain;UE functional use;GMC;Proprioception    Cognitive Skills Attention;Memory;Perception;Safety Awareness    OT Frequency 2x / week    OT Duration 8 weeks    OT Treatment/Interventions Self-care/ADL training;Moist Heat;Fluidtherapy;DME and/or AE instruction;Splinting;Therapeutic activities;Contrast Bath;Aquatic Therapy;Ultrasound;Therapeutic exercise;Cognitive remediation/compensation;Visual/perceptual remediation/compensation;Passive range of motion;Functional Mobility Training;Neuromuscular education;Cryotherapy;Electrical Stimulation;Paraffin;Energy conservation;Manual Therapy;Patient/family education    Plan Assess LTG #5 and d/c next session    Consulted and Agree with Plan of Care Patient           Patient will benefit from skilled therapeutic intervention in order to improve the following deficits and impairments:   Body Structure / Function / Physical Skills: ADL, Dexterity, ROM, IADL, Sensation, Mobility, Strength, FMC, Coordination, Decreased knowledge of precautions, Decreased knowledge of use of DME, Pain, UE functional use, GMC, Proprioception Cognitive Skills: Attention, Memory,  Perception, Safety Awareness     Visit Diagnosis: Hemiplegia and hemiparesis following cerebral infarction affecting left non-dominant side (HCC)  Other lack of coordination  Other symptoms and signs involving the nervous system  Muscle weakness (generalized)    Problem List Patient Active Problem List   Diagnosis Date Noted  . Left hemiparesis ([place]) 03/28/2020  . Chronic bilateral low back pain without sciatica 02/25/2020  . Spasticity as late effect of cerebrovascular accident (CVA) 02/25/2020  . Cognitive and neurobehavioral dysfunction   . Hypertensive urgency 01/28/2020  . Urinary retention 01/28/2020  . Right middle cerebral artery stroke ([place]) 01/28/2020  . Stroke (cerebrum) ([place]) -  [name] 01/25/2020  . Acute respiratory failure with hypoxia ([place])   . Hypotension   . Hypokalemia   . Hypocalcemia   . Encephalopathy acute   . Stroke ([place]) 01/24/2020  . Middle cerebral artery embolism, right 01/24/2020  . Hyperlipidemia 02/21/2019  . Impingement syndrome of right shoulder region 02/21/2018  . OA (osteoarthritis) of hip 01/18/2018  . Prostate cancer ([place]) 03/04/2016  . Essential hypertension 11/27/2014  . History of colonic polyps 09/26/2014  . BACK PAIN, LEFT 12/22/2009  . PROSTATE SPECIFIC ANTIGEN, ELEVATED 12/20/2008  . LATERAL EPICONDYLITIS 07/15/2008  . ACTINIC KERATOSIS, FOREHEAD, LEFT 05/23/2008  . WART, LEFT HAND 07/19/2007  . ERECTILE DYSFUNCTION, MILD 07/19/2007  . HERNIA, BILATERAL INGUINAL W/O OBST/GANGRENE 07/19/2007  . MENISCUS TEAR 07/19/2007  . Bilateral inguinal hernia 07/19/2007    Carey Bullocks, OTR/L 07/15/2020, 11:40 AM  [facility] [address] [place], [place], [address] Phone: [phone]   Fax:  [phone]  Name: ORVILLE MENA MRN: [id number] Date  of Birth: 11-10-1951

## 2020-07-15 NOTE — Telephone Encounter (Signed)
This would be a call to neurology. But I believe he is correct, should only be on ASA at this point.

## 2020-07-16 NOTE — Telephone Encounter (Signed)
Spoke with patient and medication list has been updated.

## 2020-07-16 NOTE — Telephone Encounter (Signed)
Spoke with patient. See phone note.

## 2020-07-16 NOTE — Telephone Encounter (Signed)
Pt stated he would like a call back from [place] because he may have a solution to his problem regarding the Plavix. He also wanted to apologize as well.   Pt can be reached at [phone]

## 2020-07-17 ENCOUNTER — Ambulatory Visit: Payer: PPO | Admitting: Occupational Therapy

## 2020-07-17 ENCOUNTER — Other Ambulatory Visit: Payer: Self-pay

## 2020-07-17 DIAGNOSIS — M6281 Muscle weakness (generalized): Secondary | ICD-10-CM

## 2020-07-17 DIAGNOSIS — I69354 Hemiplegia and hemiparesis following cerebral infarction affecting left non-dominant side: Secondary | ICD-10-CM

## 2020-07-17 DIAGNOSIS — R278 Other lack of coordination: Secondary | ICD-10-CM

## 2020-07-17 DIAGNOSIS — R29818 Other symptoms and signs involving the nervous system: Secondary | ICD-10-CM

## 2020-07-17 NOTE — Therapy (Signed)
[facility] [address] [place], [place], [address] Phone: [phone]   Fax:  [phone]  Occupational Therapy Treatment  Patient Details  Name: Ruben Chavez MRN: [id number] Date of Birth: 09/10/1951 Referring Provider (OT): Lauraine Rinne, PA-C   Encounter Date: 07/17/2020   OT End of Session - 07/17/20 1600    Visit Number 31    Number of Visits 33    Date for OT Re-Evaluation 07/20/20    Authorization Type [employer] (follow Medicare)--1 copay for multiple visits    Authorization Time Period cert date 7/32 - 01/07/53    Authorization - Visit Number 55    OT Start Time 2706    OT Stop Time 1605    OT Time Calculation (min) 35 min    Activity Tolerance Patient tolerated treatment well    Behavior During Therapy [facility] for tasks assessed/performed           Past Medical History:  Diagnosis Date  . Elevated PSA   . Hyperlipidemia    pt says not anymore  . Hypertension   . Prostate cancer ([place]) 2013    Past Surgical History:  Procedure Laterality Date  . bilateral inguinal hernia  2008  . BUBBLE STUDY  01/28/2020   Procedure: BUBBLE STUDY;  Surgeon: [name], MD;  Location: [facility];  Service: Cardiovascular;;  . CYST REMOVAL TRUNK  2016   sebaceous cyst  . CYSTOSCOPY  04/22/2016   Procedure: CYSTOSCOPY;  Surgeon: [name], MD;  Location: [facility];  Service: Urology;;  . IR ANGIO INTRA EXTRACRAN SEL COM CAROTID INNOMINATE BILAT MOD SED  05/30/2020  . IR ANGIO VERTEBRAL SEL SUBCLAVIAN INNOMINATE UNI R MOD SED  01/25/2020  . IR ANGIO VERTEBRAL SEL VERTEBRAL BILAT MOD SED  05/30/2020  . IR CT HEAD LTD  01/25/2020  . IR PERCUTANEOUS ART THROMBECTOMY/INFUSION INTRACRANIAL INC DIAG ANGIO  01/25/2020  . IR US GUIDE VASC ACCESS RIGHT  05/30/2020  . KNEE ARTHROSCOPY  2007   right  . LOOP RECORDER INSERTION N/A 01/28/2020   Procedure: LOOP RECORDER INSERTION;  Surgeon: [name], MD;  Location: [facility];  Service: Cardiovascular;  Laterality: N/A;  . PROSTATE BIOPSY  2008  . PROSTATE BIOPSY  2013  . PROSTATE BIOPSY  01/28/2016  . RADIOACTIVE SEED IMPLANT N/A 04/22/2016   Procedure: RADIOACTIVE SEED IMPLANT/BRACHYTHERAPY IMPLANT;  Surgeon: [name], MD;  Location: [facility];  Service: Urology;  Laterality: N/A;  . RADIOLOGY WITH ANESTHESIA N/A 01/24/2020   Procedure: IR WITH ANESTHESIA;  Surgeon: [name], MD;  Location: [facility];  Service: Radiology;  Laterality: N/A;  . TEE WITHOUT CARDIOVERSION N/A 01/28/2020   Procedure: TRANSESOPHAGEAL ECHOCARDIOGRAM (TEE);  Surgeon: [name], MD;  Location: [facility];  Service: Cardiovascular;  Laterality: N/A;  . TONSILLECTOMY    . TOTAL HIP ARTHROPLASTY  2001   left  . TOTAL HIP ARTHROPLASTY Right 01/18/2018   Procedure: RIGHT TOTAL HIP ARTHROPLASTY ANTERIOR APPROACH;  Surgeon: [name], MD;  Location: [facility];  Service: Orthopedics;  Laterality: Right;    There were no vitals filed for this visit.   Subjective Assessment - 07/17/20 1533    Pertinent History PMH:  hyperlipidemia, hypertension, prostate cancer radioactive seed implant 2017, R total hip arthroplasty, loop recorder insertion    Limitations loop recorder    Patient Stated Goals get my L hand and fingers working    Currently in Pain? No/denies  UBE x 8 minutes Lt hand wrapped. Environmental scanning in min busy gym finding 14/14 on first pass, with extra time.  NMES x 10 min to wrist/finger extensors (previous parameters)  Pt provided extra strapping and hooks for splints                        OT Short Term Goals - 07/09/20 1407      OT SHORT TERM GOAL #1   Title Pt will be independent with initial HEP--check STG 03/24/20    Time 4    Period Weeks    Status Achieved      OT SHORT TERM GOAL #2   Title Pt will verbalize understanding of proper positioning of  LUE to prevent pain/injury and edema management techniques.    Time 4    Period Weeks    Status Achieved      OT SHORT TERM GOAL #3   Title Pt will be independent with splint wear/care for improved positioning.    Time 4    Period Weeks    Status Achieved      OT SHORT TERM GOAL #4   Title Pt will be able to use LUE as a stabilizer for simple ADL tasks.    Time 4    Period Weeks    Status Achieved      OT SHORT TERM GOAL #5   Title Pt will demo at least 25% gross finger flex in prep for functional grasp.    Baseline 4    Time 4    Period Weeks    Status Achieved   except index and thumb            OT Long Term Goals - 07/17/20 1601      OT LONG TERM GOAL #1   Title Pt will be independent with updated HEP.--check LTGs 04/23/20    Time 8    Period Weeks    Status Deferred   unable to upgrade     OT LONG TERM GOAL #2   Title Pt will be able to use LUE as nondominant assist for ADLs at least 50% of the time.    Time 8    Period Weeks    Status Not Met   25% of the time     OT LONG TERM GOAL #3   Title Pt will be able to score at least 10 on box and blocks test with LUE for improved coordination for ADLs.    Time 8    Period Weeks    Status Achieved   07/09/20: 10 blocks (11 blocks on 2nd trial)     OT LONG TERM GOAL #4   Title Pt will complete clothing fasteners mod I using AE prn.    Time 8    Period Weeks    Status Achieved   Rt hand mostly, A/E for shoes     OT LONG TERM GOAL #5   Title Pt will demo improved attention/visual scanning to be able to perform environmental scanning in busy enivornment with at least 95% accuracy for incr safety for community activities/driving    Baseline 03/13/53;  58% initially    Time 8    Period Weeks    Status Achieved   100% accuracy with extra time (gym was only minimally busy at reassessment)     OT LONG TERM GOAL #6   Title Pt will demo at least 75% gross finger flex/ext for grasp/release of  objects.    Time 8    Period  Weeks    Status Partially Met   approx 75% extension approx 50% flexion (performance inconsistent at times)                Plan - 07/17/20 1602    Clinical Impression Statement Pt has met LTG #5.    Occupational performance deficits (Please refer to evaluation for details): ADL's;IADL's;Leisure;Social Participation;Work    Marketing executive / Function / Physical Skills ADL;Dexterity;ROM;IADL;Sensation;Mobility;Strength;FMC;Coordination;Decreased knowledge of precautions;Decreased knowledge of use of DME;Pain;UE functional use;GMC;Proprioception    Cognitive Skills Attention;Memory;Perception;Safety Awareness    OT Frequency 2x / week    OT Duration 8 weeks    OT Treatment/Interventions Self-care/ADL training;Moist Heat;Fluidtherapy;DME and/or AE instruction;Splinting;Therapeutic activities;Contrast Bath;Aquatic Therapy;Ultrasound;Therapeutic exercise;Cognitive remediation/compensation;Visual/perceptual remediation/compensation;Passive range of motion;Functional Mobility Training;Neuromuscular education;Cryotherapy;Electrical Stimulation;Paraffin;Energy conservation;Manual Therapy;Patient/family education    Plan D/C O.T.    Consulted and Agree with Plan of Care Patient           Patient will benefit from skilled therapeutic intervention in order to improve the following deficits and impairments:   Body Structure / Function / Physical Skills: ADL, Dexterity, ROM, IADL, Sensation, Mobility, Strength, FMC, Coordination, Decreased knowledge of precautions, Decreased knowledge of use of DME, Pain, UE functional use, GMC, Proprioception Cognitive Skills: Attention, Memory, Perception, Safety Awareness     Visit Diagnosis: Hemiplegia and hemiparesis following cerebral infarction affecting left non-dominant side (HCC)  Other lack of coordination  Other symptoms and signs involving the nervous system  Muscle weakness (generalized)    Problem List Patient Active Problem List    Diagnosis Date Noted  . Left hemiparesis ([place]) 03/28/2020  . Chronic bilateral low back pain without sciatica 02/25/2020  . Spasticity as late effect of cerebrovascular accident (CVA) 02/25/2020  . Cognitive and neurobehavioral dysfunction   . Hypertensive urgency 01/28/2020  . Urinary retention 01/28/2020  . Right middle cerebral artery stroke ([place]) 01/28/2020  . Stroke (cerebrum) ([place]) -  [name] 01/25/2020  . Acute respiratory failure with hypoxia ([place])   . Hypotension   . Hypokalemia   . Hypocalcemia   . Encephalopathy acute   . Stroke ([place]) 01/24/2020  . Middle cerebral artery embolism, right 01/24/2020  . Hyperlipidemia 02/21/2019  . Impingement syndrome of right shoulder region 02/21/2018  . OA (osteoarthritis) of hip 01/18/2018  . Prostate cancer ([place]) 03/04/2016  . Essential hypertension 11/27/2014  . History of colonic polyps 09/26/2014  . BACK PAIN, LEFT 12/22/2009  . PROSTATE SPECIFIC ANTIGEN, ELEVATED 12/20/2008  . LATERAL EPICONDYLITIS 07/15/2008  . ACTINIC KERATOSIS, FOREHEAD, LEFT 05/23/2008  . WART, LEFT HAND 07/19/2007  . ERECTILE DYSFUNCTION, MILD 07/19/2007  . HERNIA, BILATERAL INGUINAL W/O OBST/GANGRENE 07/19/2007  . MENISCUS TEAR 07/19/2007  . Bilateral inguinal hernia 07/19/2007   OCCUPATIONAL THERAPY DISCHARGE SUMMARY  Visits from Start of Care: 31  Current functional level related to goals / functional outcomes: See above   Remaining deficits: Lt hemiplegia (worse distally)    Education / Equipment: HEP's, A/E reocmmendations, safety and visual scanning strategies  Plan: Patient agrees to discharge.  Patient goals were partially met. Patient is being discharged due to lack of progress.  ?????        Carey Bullocks, OTR/L 07/17/20 16:11    [facility] [address]  [place], [place], [address] Phone: [phone]   Fax:  [place]  Name: Ruben Chavez MRN: [id number] Date of Birth: May 30, 1951

## 2020-07-22 ENCOUNTER — Telehealth (HOSPITAL_COMMUNITY): Payer: Self-pay

## 2020-07-22 NOTE — Telephone Encounter (Signed)
Returned pt's call, no answer, left vm. AW  

## 2020-07-24 ENCOUNTER — Telehealth (HOSPITAL_COMMUNITY): Payer: Self-pay | Admitting: Radiology

## 2020-07-24 ENCOUNTER — Telehealth (HOSPITAL_COMMUNITY): Payer: Self-pay

## 2020-07-24 ENCOUNTER — Other Ambulatory Visit (HOSPITAL_COMMUNITY): Payer: Self-pay | Admitting: Interventional Radiology

## 2020-07-24 DIAGNOSIS — I63311 Cerebral infarction due to thrombosis of right middle cerebral artery: Secondary | ICD-10-CM

## 2020-07-24 NOTE — Telephone Encounter (Signed)
Pt called with multiple questions concerning his medical issues. He is unclear about his follow-ups and what exactly the stroke did to his brain. He has confusion about which arteries were compromised due to the stroke and how they will be followed in the future. Pt wishes to come in for a consultation ASAP. Appt made for tomorrow to discuss. JM

## 2020-07-25 ENCOUNTER — Ambulatory Visit (HOSPITAL_COMMUNITY)
Admission: RE | Admit: 2020-07-25 | Discharge: 2020-07-25 | Disposition: A | Discharge status: Home / Self Care | Payer: PPO | Source: Ambulatory Visit | Attending: Interventional Radiology | Admitting: Interventional Radiology

## 2020-07-25 DIAGNOSIS — I63311 Cerebral infarction due to thrombosis of right middle cerebral artery: Secondary | ICD-10-CM

## 2020-07-28 ENCOUNTER — Other Ambulatory Visit: Payer: Self-pay | Admitting: Internal Medicine

## 2020-07-28 ENCOUNTER — Telehealth: Payer: Self-pay | Admitting: Internal Medicine

## 2020-07-28 DIAGNOSIS — I10 Essential (primary) hypertension: Secondary | ICD-10-CM

## 2020-07-28 NOTE — Telephone Encounter (Signed)
Pt call and stated he ask for a refill on lisinopril-hydrochlorothiazide (ZESTORETIC) 20-25 MG tablet ,but was taken lisinopril 20mg  with out the [place] 20/25 and want a call back to see is he taken the right one.

## 2020-07-29 NOTE — Telephone Encounter (Signed)
Spoke with patient and he should continue lisinopril-hydrochlorothiazide (ZESTORETIC) 20-25 MG.

## 2020-08-08 ENCOUNTER — Telehealth (HOSPITAL_COMMUNITY): Payer: Self-pay

## 2020-08-08 NOTE — Telephone Encounter (Signed)
Returned pt's call, no answer, left vm. AW  

## 2020-08-11 ENCOUNTER — Other Ambulatory Visit: Payer: Self-pay | Admitting: Internal Medicine

## 2020-08-13 LAB — CUP PACEART REMOTE DEVICE CHECK
Date Time Interrogation Session: 20210905230342
Implantable Pulse Generator Implant Date: 20210222

## 2020-08-18 ENCOUNTER — Ambulatory Visit (INDEPENDENT_AMBULATORY_CARE_PROVIDER_SITE_OTHER): Payer: PPO | Admitting: *Deleted

## 2020-08-18 DIAGNOSIS — I63519 Cerebral infarction due to unspecified occlusion or stenosis of unspecified middle cerebral artery: Secondary | ICD-10-CM

## 2020-08-19 ENCOUNTER — Telehealth (HOSPITAL_COMMUNITY): Payer: Self-pay

## 2020-08-19 NOTE — Telephone Encounter (Signed)
Returned pt's call from 9/13 at 1326, no answer, left vm. AW

## 2020-08-20 NOTE — Progress Notes (Signed)
Carelink Summary Report / Loop Recorder 

## 2020-08-21 ENCOUNTER — Other Ambulatory Visit: Payer: Self-pay | Admitting: Internal Medicine

## 2020-09-01 ENCOUNTER — Other Ambulatory Visit: Payer: Self-pay | Admitting: Internal Medicine

## 2020-09-01 ENCOUNTER — Telehealth (HOSPITAL_COMMUNITY): Payer: Self-pay

## 2020-09-01 NOTE — Telephone Encounter (Signed)
Returned pt's call, no answer, left vm for pt to call Caryl Pina or [place]. AW

## 2020-09-08 ENCOUNTER — Telehealth: Payer: Self-pay

## 2020-09-08 NOTE — Telephone Encounter (Signed)
LMVM for pt that returned call re: SOONER APPT WITH  JM/NP.

## 2020-09-08 NOTE — Telephone Encounter (Signed)
Pt left a VM asking for a call back to discuss a sooner appt with Janett Billow, NP. Please call.

## 2020-09-09 NOTE — Telephone Encounter (Signed)
LMVM for pt that returning call second time about sooner appt.

## 2020-09-13 LAB — CUP PACEART REMOTE DEVICE CHECK
Date Time Interrogation Session: 20211008230131
Implantable Pulse Generator Implant Date: 20210222

## 2020-09-22 ENCOUNTER — Ambulatory Visit (INDEPENDENT_AMBULATORY_CARE_PROVIDER_SITE_OTHER): Payer: PPO

## 2020-09-22 DIAGNOSIS — I63519 Cerebral infarction due to unspecified occlusion or stenosis of unspecified middle cerebral artery: Secondary | ICD-10-CM

## 2020-09-24 NOTE — Progress Notes (Signed)
Carelink Summary Report / Loop Recorder 

## 2020-10-02 ENCOUNTER — Other Ambulatory Visit: Payer: Self-pay | Admitting: Internal Medicine

## 2020-10-03 ENCOUNTER — Other Ambulatory Visit: Payer: Self-pay | Admitting: Internal Medicine

## 2020-10-27 ENCOUNTER — Ambulatory Visit (INDEPENDENT_AMBULATORY_CARE_PROVIDER_SITE_OTHER): Payer: PPO

## 2020-10-27 DIAGNOSIS — I63411 Cerebral infarction due to embolism of right middle cerebral artery: Secondary | ICD-10-CM | POA: Diagnosis not present

## 2020-10-27 LAB — CUP PACEART REMOTE DEVICE CHECK
Date Time Interrogation Session: 20211121230647
Implantable Pulse Generator Implant Date: 20210222

## 2020-10-28 NOTE — Progress Notes (Signed)
Carelink Summary Report / Loop Recorder 

## 2020-11-10 ENCOUNTER — Telehealth: Payer: Self-pay | Admitting: Internal Medicine

## 2020-11-10 NOTE — Progress Notes (Signed)
  Chronic Care Management   Outreach Note  11/10/2020 Name: Ruben Chavez MRN: [id number] DOB: 1951-02-25  Referred by: Isaac Bliss, Rayford Halsted, MD Reason for referral : No chief complaint on file.   An unsuccessful telephone outreach was attempted today. The patient was referred to the pharmacist for assistance with care management and care coordination.   Follow Up Plan:   Carley Perdue [employer] Scheduler

## 2020-11-11 ENCOUNTER — Other Ambulatory Visit: Payer: Self-pay | Admitting: Internal Medicine

## 2020-11-11 DIAGNOSIS — F339 Major depressive disorder, recurrent, unspecified: Secondary | ICD-10-CM

## 2020-11-12 ENCOUNTER — Ambulatory Visit: Payer: PPO | Admitting: Adult Health

## 2020-11-12 ENCOUNTER — Encounter: Payer: Self-pay | Admitting: Adult Health

## 2020-11-12 ENCOUNTER — Other Ambulatory Visit: Payer: Self-pay

## 2020-11-12 VITALS — BP 120/72 | HR 74 | Ht 71.0 in | Wt 170.0 lb

## 2020-11-12 DIAGNOSIS — I10 Essential (primary) hypertension: Secondary | ICD-10-CM | POA: Diagnosis not present

## 2020-11-12 DIAGNOSIS — E785 Hyperlipidemia, unspecified: Secondary | ICD-10-CM

## 2020-11-12 DIAGNOSIS — I639 Cerebral infarction, unspecified: Secondary | ICD-10-CM

## 2020-11-12 NOTE — Patient Instructions (Addendum)
Recommend follow up with Dr. Dagoberto Ligas regarding left hand "spasticity" and possible benefit of botox injections  Follow up with Dr. Estanislado Pandy soon to reassess your carotid arteries   Continue aspirin 325 mg daily  and atorvastatin  for secondary stroke prevention  Continue to follow up with PCP regarding cholesterol and blood pressure management  Maintain strict control of hypertension with blood pressure goal below 130/90 and cholesterol with LDL cholesterol (bad cholesterol) goal below 70 mg/dL.      Followup in the future with me in 6 months or call earlier if needed     Thank you for coming to see [place] at [facility]. I hope we have been able to provide you high quality care today.  You may receive a patient satisfaction survey over the next few weeks. We would appreciate your feedback and comments so that we may continue to improve ourselves and the health of our patients.

## 2020-11-12 NOTE — Progress Notes (Signed)
[facility] [address] [place]. [place] [address] [phone]       STROKE FOLLOW UP NOTE  Ruben Chavez Date of Birth:  Dec 01, 1951 Medical Record Number:  [id number]   Reason for Referral: stroke follow up    SUBJECTIVE:   CHIEF COMPLAINT:  Chief Complaint  Patient presents with  . Follow-up    tx rm, alone, pt states he is doing well, states left hand is the same     HPI:   Today, 11/12/2020, Ruben Chavez returns for 6 month stroke follow up. Reports residual LUE weakness and hand spasticity.  He denies residual speech or cognitive impairment.  He has returned back to driving without difficulty after passing driving evaluation in June.  Denies new stroke/TIA symptoms.  Remains on aspirin and atorvastatin for secondary stroke prevention without side effects.  Blood pressure today 120/72.  Loop recorder has not shown atrial fibrillation thus far.  Underwent cerebral angiogram with Dr. Estanislado Pandy 05/30/2020 which showed continued right MCA occlusion, left ICA ulcerated plaque and R VA 30% stenosis.  He has multiple questions regarding study which were answered to the best of my ability.  No further concerns at this time.    History provided for reference purposes only Update 05/13/2020 JM: Ruben Chavez is being seen for follow-up regarding right MCA stroke in 01/2020.  Residual deficits of LUE weakness, mild dysarthria and cognitive impairment with short term memory concern. Continues to work with therapies with ongoing improvement.  Recent referral placed for driving evaluation as there was concern from current therapist regarding return to driving in regards to safety with altered awareness and cognition. Scheduled on June 23rd.  Completed 3 months DAPT and continues on aspirin alone as well as continuation of atorvastatin.  Blood pressure today 116/63.  Loop recorder is not shown atrial fibrillation thus far.  No concerns at this time.  Initial visit  03/19/2020, Ruben Chavez is being seen for hospital follow-up.  Residual deficits of LUE weakness, left facial droop, dysarthria and cognitive communication deficit.  He is currently being followed by PT/OT/ST for ongoing therapy needs.  He has not been able to return to driving at this time and feels as though this is due to decreased function of left hand.  Per review of ST note, suspicion for mild to moderate cognitive communication deficits with reduced awareness/insight of deficits.  He has continued on DAPT without bleeding or bruising.  Atorvastatin 20 mg daily initiated for stroke prevention and denies myalgias.  Blood pressure stable at 128/72.  Loop recorder has not shown atrial fibrillation thus far.  Continues to follow with PCP for HTN management.  No further concerns at this time.  Stroke admission 01/24/2020: RubenRuben Chavez a 69 y.o.malewith history of HTN, HLD, prostate cancer who presented on 01/24/2020 with L sided weakness, L hemisensory loss, L facial droop, L field cut, dysarthria and anosognosia.  Evaluated by stroke team with stroke work-up revealing right MCA infarct due to right MCA occlusion s/p TPA and IR with TICI 2B recanalization -embolic secondary to unknown source.  Loop recorder placed to rule out atrial fibrillation.  Recommended DAPT for 3 months then aspirin alone.  Hypertensive urgency upon arrival with BP 180/120 stabilized during admission and resumed lisinopril 20 mg daily.  No history of DM or HLD.  No prior history of stroke.  Other active problems include history of prostate cancer, hypokalemia, hypocalcemia, leukocytosis and urinary retention.  Residual deficits of mild leftfacialdroop, left hemiparesis and  decreased sensation and discharged to CIR for ongoing therapy needs.  Stroke: [name]  CT head subacute R lateral temporal lobe and  temporoparietal area infarcts.  CTA head &neck R M2 superior division occlusion.B ICA bifurcation atherosclerosis.   CT perfusion pseudonormalization R temporal lobe. R frontoparietal jxn infarct 46cc, R MCA 72cc.  Cerebral angio TICI2b revascularization R MCA  MRI -Patchy and scattered acute right MCA territory infarct.Petechial blood in the right temporal lobe.  2D Echo- EF 60 - 65%. No cardiac source of emboli identified.   LE venous Doppler - negative for DVT bilaterally  TEEneg PFO or other SOE  loop placement2/22([name])   LDL54 - no statin needed given goal LDL  HgbA1c 5.0  UDS pending  Lovenox for VTE prophylaxis  No antithromboticprior to admission, now on ASA 325 mg and Plavix 75 daily. Continue DAPT for 3 months and then ASA alone.  Therapy recommendations: CIR  Disposition: CIR      ROS:   14 system review of systems performed and negative with exception of those listed in HPI  PMH:  Past Medical History:  Diagnosis Date  . Elevated PSA   . Hyperlipidemia    pt says not anymore  . Hypertension   . Prostate cancer ([place]) 2013    PSH:  Past Surgical History:  Procedure Laterality Date  . bilateral inguinal hernia  2008  . BUBBLE STUDY  01/28/2020   Procedure: BUBBLE STUDY;  Surgeon: [name], MD;  Location: [place];  Service: Cardiovascular;;  . CYST REMOVAL TRUNK  2016   sebaceous cyst  . CYSTOSCOPY  04/22/2016   Procedure: CYSTOSCOPY;  Surgeon: [name], MD;  Location: [facility];  Service: Urology;;  . IR ANGIO INTRA EXTRACRAN SEL COM CAROTID INNOMINATE BILAT MOD SED  05/30/2020  . IR ANGIO VERTEBRAL SEL SUBCLAVIAN INNOMINATE UNI R MOD SED  01/25/2020  . IR ANGIO VERTEBRAL SEL VERTEBRAL BILAT MOD SED  05/30/2020  . IR CT HEAD LTD  01/25/2020  . IR PERCUTANEOUS ART THROMBECTOMY/INFUSION INTRACRANIAL INC DIAG ANGIO  01/25/2020  . IR US GUIDE VASC ACCESS RIGHT  05/30/2020  . KNEE ARTHROSCOPY  2007    right  . LOOP RECORDER INSERTION N/A 01/28/2020   Procedure: LOOP RECORDER INSERTION;  Surgeon: [name], MD;  Location: [place] [facility];  Service: Cardiovascular;  Laterality: N/A;  . PROSTATE BIOPSY  2008  . PROSTATE BIOPSY  2013  . PROSTATE BIOPSY  01/28/2016  . RADIOACTIVE SEED IMPLANT N/A 04/22/2016   Procedure: RADIOACTIVE SEED IMPLANT/BRACHYTHERAPY IMPLANT;  Surgeon: [name], MD;  Location: [facility];  Service: Urology;  Laterality: N/A;  . RADIOLOGY WITH ANESTHESIA N/A 01/24/2020   Procedure: IR WITH ANESTHESIA;  Surgeon: [name], MD;  Location: [place];  Service: Radiology;  Laterality: N/A;  . TEE WITHOUT CARDIOVERSION N/A 01/28/2020   Procedure: TRANSESOPHAGEAL ECHOCARDIOGRAM (TEE);  Surgeon: [name], MD;  Location: [facility];  Service: Cardiovascular;  Laterality: N/A;  . TONSILLECTOMY    . TOTAL HIP ARTHROPLASTY  2001   left  . TOTAL HIP ARTHROPLASTY Right 01/18/2018   Procedure: RIGHT TOTAL HIP ARTHROPLASTY ANTERIOR APPROACH;  Surgeon: [name], MD;  Location: [facility];  Service: Orthopedics;  Laterality: Right;    Social History:  Social History   Socioeconomic History  . Marital status: Married    Spouse name:  Not on file  . Number of children: Not on file  . Years of education: Not on file  . Highest education level: Not on file  Occupational History  . Not on file  Tobacco Use  . Smoking status: Former Smoker    Packs/day: 1.00    Years: 32.00    Pack years: 32.00    Types: Cigarettes    Quit date: 06/06/1999    Years since quitting: 21.4  . Smokeless tobacco: Never Used  Vaping Use  . Vaping Use: Never used  Substance and Sexual Activity  . Alcohol use: Yes    Alcohol/week: 14.0 standard drinks    Types: 14 Shots of liquor per week  . Drug use: No  . Sexual activity: Yes  Other Topics Concern  . Not on file  Social History Narrative  . Not on file   Social Determinants of Health    Financial Resource Strain:   . Difficulty of Paying Living Expenses: Not on file  Food Insecurity:   . Worried About Charity fundraiser in the Last Year: Not on file  . Ran Out of Food in the Last Year: Not on file  Transportation Needs:   . Lack of Transportation (Medical): Not on file  . Lack of Transportation (Non-Medical): Not on file  Physical Activity:   . Days of Exercise per Week: Not on file  . Minutes of Exercise per Session: Not on file  Stress:   . Feeling of Stress : Not on file  Social Connections:   . Frequency of Communication with Friends and Family: Not on file  . Frequency of Social Gatherings with Friends and Family: Not on file  . Attends Religious Services: Not on file  . Active Member of Clubs or Organizations: Not on file  . Attends Archivist Meetings: Not on file  . Marital Status: Not on file  Intimate Partner Violence:   . Fear of Current or Ex-Partner: Not on file  . Emotionally Abused: Not on file  . Physically Abused: Not on file  . Sexually Abused: Not on file    Family History:  Family History  Problem Relation Age of Onset  . Pancreatic cancer Mother   . Heart disease Father   . Colon cancer Neg Hx     Medications:   Current Outpatient Medications on File Prior to Visit  Medication Sig Dispense Refill  . acetaminophen (TYLENOL) 325 MG tablet Take 2 tablets (650 mg total) by mouth every 4 (four) hours as needed for mild pain (or temp > 37.5 C (99.5 F)).    Marland Kitchen aspirin EC 325 MG EC tablet Take 1 tablet (325 mg total) by mouth daily. 30 tablet 0  . atorvastatin (LIPITOR) 20 MG tablet TAKE 1 TABLET(20 MG) BY MOUTH DAILY AT 6 PM 90 tablet 1  . buPROPion (WELLBUTRIN XL) 300 MG 24 hr tablet TAKE 1 TABLET(300 MG) BY MOUTH DAILY 90 tablet 0  . folic acid (FOLVITE) 1 MG tablet TAKE 1 TABLET(1 MG) BY MOUTH DAILY 90 tablet 1  . lisinopril-hydrochlorothiazide (ZESTORETIC) 20-25 MG tablet TAKE 1 TABLET BY MOUTH DAILY 90 tablet 1  .  Magnesium 250 MG TABS Take 250 mg by mouth daily.     . Multiple Vitamin (MULTIVITAMIN WITH MINERALS) TABS tablet Take 1 tablet by mouth daily.    . pantoprazole (PROTONIX) 40 MG tablet TAKE 1 TABLET(40 MG) BY MOUTH DAILY 90 tablet 1  . senna-docusate (SENOKOT-S) 8.6-50 MG tablet Take 2  tablets by mouth daily.    . traZODone (DESYREL) 100 MG tablet TAKE 1 TABLET(100 MG) BY MOUTH AT BEDTIME 90 tablet 0   No current facility-administered medications on file prior to visit.    Allergies:   Allergies  Allergen Reactions  . Sulfa Antibiotics Swelling and Other (See Comments)    Swelling in ankles   . Advil [Ibuprofen] Other (See Comments)    "Dots on chest" (Petechiae)  . Aleve [Naproxen] Other (See Comments)    "Dots on chest" (Petechiae)  . Betadine [Povidone Iodine] Itching and Rash  . Chloroxylenol (Antiseptic) Itching, Rash and Other (See Comments)    PCMX surgical sterilizing scrub  . Poison Ivy Extract Rash  . Povidone-Iodine Itching and Rash    BETADINE      OBJECTIVE:  Vitals  Vitals:   11/12/20 1311  BP: 120/72  Pulse: 74  Weight: 170 lb (77.1 kg)  Height: _0  (1.803 m)   Body mass index is 23.71 kg/m. No exam data present   Physical exam:  General: well developed, well nourished,  pleasant elderly Caucasian male, seated, in no evident distress Head: head normocephalic and atraumatic.   Neck: supple with no carotid or supraclavicular bruits Cardiovascular: regular rate and rhythm, no murmurs Musculoskeletal: no deformity Skin:  no rash/petichiae Vascular:  Normal pulses all extremities   Neurologic Exam Mental Status: Awake and fully alert. fluent speech and language. Oriented to place and time. Recent  and remote memory intact. Attention span, concentration and fund of knowledge appropriate during visit. Mood and affect appropriate.  Cranial Nerves: Pupils equal, briskly reactive to light. Extraocular movements full without nystagmus. Visual fields  full to confrontation. Hearing intact. Facial sensation intact. Mild left lower facial weakness Motor: Full strength in all tested extremities except left hand weakness with increased tone and limited flexibility Sensory.:  Intact light touch, pinprick and vibratory sensation Coordination: Rapid alternating movements normal in all extremities except decreased left hand. Finger-to-nose and heel-to-shin performed accurately bilaterally. Gait and Station: Arises from chair without difficulty. Stance is normal. Gait demonstrates normal stride length and balance Reflexes: 1+ and symmetric. Toes downgoing.        ASSESSMENT: Ruben Chavez is a 69 y.o. year old male presented with left-sided weakness, left hemisensory loss, left facial droop, left field cut, dysarthria and anosognosia on 01/24/2020 with stroke work-up revealing right MCA infarct due to right MCA occlusion s/p TPA and IR with TICI 2B recanalization -embolic secondary to unknown source therefore loop recorder placed which has not shown atrial fibrillation . Vascular risk factors include HTN, HLD and history of EtOH use.  Residual deficit of mild dysarthria, LUE weakness and cognitive impairment     PLAN:  1. Left MCA stroke:  -Residual deficits: Left hand weakness with increased spasticity.  Advised to contact PMR to further discuss possible benefit of Botox injections -Loop recorder has not shown atrial fibrillation thus far.  Continuously monitored by cardiology  -Continue aspirin 325 mg daily  and atorvastatin 20 mg daily for secondary stroke prevention.   -discussed secondary stroke prevention and importance of close PCP follow up for aggressive stroke risk management 2. HTN: BP goal <130/90. Stable on lisinopril-hydrochlorothiazide per PCP 3. HLD: LDL 70. On atorvastatin 28m daily monitored and prescribed by PCP 4. Intra and extracranial stenosis: Monitored by Dr. DEstanislado Pandy  Plans on undergoing CUS at the end of this  month -awaiting to be contacted to schedule.  He had multiple questions regarding cerebral arteriogram completed in  05/2020 which were answered to the best my ability and deferred further questions or concerns to Dr. Estanislado Pandy    Follow up in 6 months or call earlier if needed  I spent 30 minutes of face-to-face and non-face-to-face time with patient.  This included previsit chart review, lab review, study review, order entry, electronic health record documentation, patient education and discussion regarding history of prior stroke, residual deficits, recent cerebral arteriogram and indication for surveillance monitoring, importance of managing stroke risk factors and answered all questions to patient satisfaction   Frann Rider, [facility]  [facility] [address] [place], [place] [address]  Phone [phone] Fax [phone] Note: This document was prepared with digital dictation and possible smart phrase technology. Any transcriptional errors that result from this process are unintentional.

## 2020-11-13 ENCOUNTER — Encounter: Payer: Self-pay | Admitting: Adult Health

## 2020-11-13 NOTE — Progress Notes (Signed)
I agree with the above plan 

## 2020-11-19 ENCOUNTER — Ambulatory Visit: Payer: PPO | Admitting: Adult Health

## 2020-11-19 ENCOUNTER — Ambulatory Visit: Payer: PPO | Admitting: Internal Medicine

## 2020-11-21 ENCOUNTER — Telehealth: Payer: Self-pay | Admitting: Internal Medicine

## 2020-11-21 NOTE — Progress Notes (Signed)
°  Chronic Care Management   Outreach Note  11/21/2020 Name: Ruben Chavez MRN: [id number] DOB: Mar 06, 1951  Referred by: Isaac Bliss, Rayford Halsted, MD Reason for referral : No chief complaint on file.   A second unsuccessful telephone outreach was attempted today. The patient was referred to pharmacist for assistance with care management and care coordination.  Follow Up Plan:   Carley Perdue [employer] Scheduler

## 2020-11-30 LAB — CUP PACEART REMOTE DEVICE CHECK
Date Time Interrogation Session: 20211224230124
Implantable Pulse Generator Implant Date: 20210222

## 2020-12-01 ENCOUNTER — Ambulatory Visit (INDEPENDENT_AMBULATORY_CARE_PROVIDER_SITE_OTHER): Payer: PPO

## 2020-12-01 DIAGNOSIS — I63519 Cerebral infarction due to unspecified occlusion or stenosis of unspecified middle cerebral artery: Secondary | ICD-10-CM

## 2020-12-06 DIAGNOSIS — C229 Malignant neoplasm of liver, not specified as primary or secondary: Secondary | ICD-10-CM

## 2020-12-06 HISTORY — DX: Malignant neoplasm of liver, not specified as primary or secondary: C22.9

## 2020-12-09 ENCOUNTER — Telehealth (HOSPITAL_COMMUNITY): Payer: Self-pay

## 2020-12-09 NOTE — Telephone Encounter (Signed)
Called to schedule us carotid, no answer, left vm. AW  

## 2020-12-11 NOTE — Progress Notes (Signed)
Carelink Summary Report / Loop Recorder 

## 2020-12-18 ENCOUNTER — Other Ambulatory Visit: Payer: Self-pay

## 2020-12-19 ENCOUNTER — Encounter: Payer: Self-pay | Admitting: Internal Medicine

## 2020-12-19 ENCOUNTER — Ambulatory Visit (INDEPENDENT_AMBULATORY_CARE_PROVIDER_SITE_OTHER): Payer: PPO | Admitting: Internal Medicine

## 2020-12-19 VITALS — BP 130/70 | HR 65 | Temp 98.3°F | Wt 172.3 lb

## 2020-12-19 DIAGNOSIS — Z23 Encounter for immunization: Secondary | ICD-10-CM

## 2020-12-19 DIAGNOSIS — I63511 Cerebral infarction due to unspecified occlusion or stenosis of right middle cerebral artery: Secondary | ICD-10-CM | POA: Diagnosis not present

## 2020-12-19 DIAGNOSIS — G8194 Hemiplegia, unspecified affecting left nondominant side: Secondary | ICD-10-CM

## 2020-12-19 DIAGNOSIS — D170 Benign lipomatous neoplasm of skin and subcutaneous tissue of head, face and neck: Secondary | ICD-10-CM | POA: Diagnosis not present

## 2020-12-19 DIAGNOSIS — E785 Hyperlipidemia, unspecified: Secondary | ICD-10-CM | POA: Diagnosis not present

## 2020-12-19 DIAGNOSIS — I10 Essential (primary) hypertension: Secondary | ICD-10-CM

## 2020-12-19 NOTE — Progress Notes (Signed)
Established Patient Office Visit     This visit occurred during the SARS-CoV-2 public health emergency.  Safety protocols were in place, including screening questions prior to the visit, additional usage of staff PPE, and extensive cleaning of exam room while observing appropriate contact time as indicated for disinfecting solutions.    CC/Reason for Visit: Follow-up chronic medical conditions  HPI: Ruben Chavez is a 70 y.o. male who is coming in today for the above mentioned reasons. Past Medical History is significant for: Well-controlled hypertension, well-controlled hyperlipidemia, history of prostate cancer, history of right hip replacement in 2019 due to osteoarthritis.  He suffered a right MCA CVA in February 2021.  He has been on Wellbutrin since the stroke due to decreased patience and anger issues.  He is requesting flu vaccine.  He has a lipoma in the back of his neck that he feels has been growing in size and causing some pain and would like referral to a local surgeon to consider excision.   Past Medical/Surgical History: Past Medical History:  Diagnosis Date  . Elevated PSA   . Hyperlipidemia    pt says not anymore  . Hypertension   . Prostate cancer ([place]) 2013    Past Surgical History:  Procedure Laterality Date  . bilateral inguinal hernia  2008  . BUBBLE STUDY  01/28/2020   Procedure: BUBBLE STUDY;  Surgeon: [name], MD;  Location: [place];  Service: Cardiovascular;;  . CYST REMOVAL TRUNK  2016   sebaceous cyst  . CYSTOSCOPY  04/22/2016   Procedure: CYSTOSCOPY;  Surgeon: [name], MD;  Location: [facility];  Service: Urology;;  . IR ANGIO INTRA EXTRACRAN SEL COM CAROTID INNOMINATE BILAT MOD SED  05/30/2020  . IR ANGIO VERTEBRAL SEL SUBCLAVIAN INNOMINATE UNI R MOD SED  01/25/2020  . IR ANGIO VERTEBRAL SEL VERTEBRAL BILAT MOD SED  05/30/2020  . IR CT HEAD LTD  01/25/2020  . IR PERCUTANEOUS ART THROMBECTOMY/INFUSION  INTRACRANIAL INC DIAG ANGIO  01/25/2020  . IR US GUIDE VASC ACCESS RIGHT  05/30/2020  . KNEE ARTHROSCOPY  2007   right  . LOOP RECORDER INSERTION N/A 01/28/2020   Procedure: LOOP RECORDER INSERTION;  Surgeon: [name], MD;  Location: [facility];  Service: Cardiovascular;  Laterality: N/A;  . PROSTATE BIOPSY  2008  . PROSTATE BIOPSY  2013  . PROSTATE BIOPSY  01/28/2016  . RADIOACTIVE SEED IMPLANT N/A 04/22/2016   Procedure: RADIOACTIVE SEED IMPLANT/BRACHYTHERAPY IMPLANT;  Surgeon: [name], MD;  Location: [facility];  Service: Urology;  Laterality: N/A;  . RADIOLOGY WITH ANESTHESIA N/A 01/24/2020   Procedure: IR WITH ANESTHESIA;  Surgeon: [name], MD;  Location: [place];  Service: Radiology;  Laterality: N/A;  . TEE WITHOUT CARDIOVERSION N/A 01/28/2020   Procedure: TRANSESOPHAGEAL ECHOCARDIOGRAM (TEE);  Surgeon: [name], MD;  Location: [facility];  Service: Cardiovascular;  Laterality: N/A;  . TONSILLECTOMY    . TOTAL HIP ARTHROPLASTY  2001   left  . TOTAL HIP ARTHROPLASTY Right 01/18/2018   Procedure: RIGHT TOTAL HIP ARTHROPLASTY ANTERIOR APPROACH;  Surgeon: [name], MD;  Location: [facility];  Service: Orthopedics;  Laterality: Right;    Social History:  reports that he quit smoking about 21 years ago. His smoking use included cigarettes. He has a 32.00 pack-year smoking history. He has never used smokeless tobacco. He reports current alcohol use of about 14.0 standard drinks of alcohol per week. He reports that he  does not use drugs.  Allergies: Allergies  Allergen Reactions  . Sulfa Antibiotics Swelling and Other (See Comments)    Swelling in ankles   . Advil [Ibuprofen] Other (See Comments)    "Dots on chest" (Petechiae)  . Aleve [Naproxen] Other (See Comments)    "Dots on chest" (Petechiae)  . Betadine [Povidone Iodine] Itching and Rash  . Chloroxylenol (Antiseptic) Itching, Rash and Other (See Comments)    PCMX surgical  sterilizing scrub  . Poison Ivy Extract Rash  . Povidone-Iodine Itching and Rash    BETADINE    Family History:  Family History  Problem Relation Age of Onset  . Pancreatic cancer Mother   . Heart disease Father   . Colon cancer Neg Hx      Current Outpatient Medications:  .  acetaminophen (TYLENOL) 325 MG tablet, Take 2 tablets (650 mg total) by mouth every 4 (four) hours as needed for mild pain (or temp > 37.5 C (99.5 F))., Disp:  , Rfl:  .  aspirin EC 325 MG EC tablet, Take 1 tablet (325 mg total) by mouth daily., Disp: 30 tablet, Rfl: 0 .  atorvastatin (LIPITOR) 20 MG tablet, TAKE 1 TABLET(20 MG) BY MOUTH DAILY AT 6 PM, Disp: 90 tablet, Rfl: 1 .  buPROPion (WELLBUTRIN XL) 300 MG 24 hr tablet, TAKE 1 TABLET(300 MG) BY MOUTH DAILY, Disp: 90 tablet, Rfl: 0 .  folic acid (FOLVITE) 1 MG tablet, TAKE 1 TABLET(1 MG) BY MOUTH DAILY, Disp: 90 tablet, Rfl: 1 .  lisinopril-hydrochlorothiazide (ZESTORETIC) 20-25 MG tablet, TAKE 1 TABLET BY MOUTH DAILY, Disp: 90 tablet, Rfl: 1 .  Magnesium 250 MG TABS, Take 250 mg by mouth daily. , Disp: , Rfl:  .  Multiple Vitamin (MULTIVITAMIN WITH MINERALS) TABS tablet, Take 1 tablet by mouth daily., Disp:  , Rfl:  .  pantoprazole (PROTONIX) 40 MG tablet, TAKE 1 TABLET(40 MG) BY MOUTH DAILY, Disp: 90 tablet, Rfl: 1 .  senna-docusate (SENOKOT-S) 8.6-50 MG tablet, Take 2 tablets by mouth daily., Disp:  , Rfl:  .  traZODone (DESYREL) 100 MG tablet, TAKE 1 TABLET(100 MG) BY MOUTH AT BEDTIME, Disp: 90 tablet, Rfl: 0  Review of Systems:  Constitutional: Denies fever, chills, diaphoresis, appetite change and fatigue.  HEENT: Denies photophobia, eye pain, redness, hearing loss, ear pain, congestion, sore throat, rhinorrhea, sneezing, mouth sores, trouble swallowing, neck pain, neck stiffness and tinnitus.   Respiratory: Denies SOB, DOE, cough, chest tightness,  and wheezing.   Cardiovascular: Denies chest pain, palpitations and leg swelling.  Gastrointestinal:  Denies nausea, vomiting, abdominal pain, diarrhea, constipation, blood in stool and abdominal distention.  Genitourinary: Denies dysuria, urgency, frequency, hematuria, flank pain and difficulty urinating.  Endocrine: Denies: hot or cold intolerance, sweats, changes in hair or nails, polyuria, polydipsia. Musculoskeletal: Denies myalgias, back pain, joint swelling, arthralgias and gait problem.  Skin: Denies pallor, rash and wound.  Neurological: Denies dizziness, seizures, syncope, weakness, light-headedness, numbness and headaches.  Hematological: Denies adenopathy. Easy bruising, personal or family bleeding history  Psychiatric/Behavioral: Denies suicidal ideation, mood changes, confusion, nervousness, sleep disturbance and agitation    Physical Exam: Vitals:   12/19/20 1439  BP: 130/70  Pulse: 65  Temp: 98.3 F (36.8 C)  TempSrc: Oral  SpO2: 98%  Weight: 172 lb 4.8 oz (78.2 kg)    Body mass index is 24.03 kg/m.   Constitutional: NAD, calm, comfortable Eyes: PERRL, lids and conjunctivae normal, wears corrective lenses ENMT: Mucous membranes are moist. Respiratory: clear to auscultation bilaterally, no  wheezing, no crackles. Normal respiratory effort. No accessory muscle use.  Cardiovascular: Regular rate and rhythm, no murmurs / rubs / gallops. No extremity edema. Marland Kitchen  Psychiatric: Normal judgment and insight. Alert and oriented x 3. Normal mood.    Impression and Plan:  Right middle cerebral artery stroke (HCC) Left hemiparesis ([place]) -Noted  Hyperlipidemia, unspecified hyperlipidemia type -Well-controlled on statin therapy with a most recent LDL of 54 in February 2021.  Essential hypertension -Well-controlled.  Lipoma of neck -Surgical referral.  Need for influenza vaccination -Flu vaccine administered today    Patient Instructions  -Nice seeing you today!!  -Flu vaccine today.  -Schedule follow up for your physical in 3 months. Please come in fasting  that day.     Lelon Frohlich, MD [facility]

## 2020-12-19 NOTE — Patient Instructions (Signed)
-  Nice seeing you today!!  -Flu vaccine today.  -Schedule follow up for your physical in 3 months. Please come in fasting that day.

## 2020-12-24 ENCOUNTER — Ambulatory Visit (INDEPENDENT_AMBULATORY_CARE_PROVIDER_SITE_OTHER): Payer: PPO | Admitting: Internal Medicine

## 2020-12-24 ENCOUNTER — Encounter: Payer: Self-pay | Admitting: Internal Medicine

## 2020-12-24 ENCOUNTER — Other Ambulatory Visit: Payer: Self-pay

## 2020-12-24 VITALS — BP 102/68 | HR 56 | Temp 97.9°F | Ht 68.0 in | Wt 166.3 lb

## 2020-12-24 DIAGNOSIS — Z0000 Encounter for general adult medical examination without abnormal findings: Secondary | ICD-10-CM | POA: Diagnosis not present

## 2020-12-24 DIAGNOSIS — G8194 Hemiplegia, unspecified affecting left nondominant side: Secondary | ICD-10-CM

## 2020-12-24 DIAGNOSIS — I10 Essential (primary) hypertension: Secondary | ICD-10-CM | POA: Diagnosis not present

## 2020-12-24 DIAGNOSIS — Z1211 Encounter for screening for malignant neoplasm of colon: Secondary | ICD-10-CM

## 2020-12-24 DIAGNOSIS — I69398 Other sequelae of cerebral infarction: Secondary | ICD-10-CM | POA: Diagnosis not present

## 2020-12-24 DIAGNOSIS — I63511 Cerebral infarction due to unspecified occlusion or stenosis of right middle cerebral artery: Secondary | ICD-10-CM | POA: Diagnosis not present

## 2020-12-24 DIAGNOSIS — R252 Cramp and spasm: Secondary | ICD-10-CM | POA: Diagnosis not present

## 2020-12-24 DIAGNOSIS — Z86010 Personal history of colonic polyps: Secondary | ICD-10-CM | POA: Diagnosis not present

## 2020-12-24 DIAGNOSIS — E785 Hyperlipidemia, unspecified: Secondary | ICD-10-CM

## 2020-12-24 DIAGNOSIS — C61 Malignant neoplasm of prostate: Secondary | ICD-10-CM

## 2020-12-24 LAB — LIPID PANEL
Cholesterol: 94 mg/dL (ref 0–200)
HDL: 41.5 mg/dL (ref >39.00)
LDL Cholesterol: 41 mg/dL (ref 0–99)
NonHDL: 52.72
Total CHOL/HDL Ratio: 2
Triglycerides: 61 mg/dL (ref 0.0–149.0)
VLDL: 12.2 mg/dL (ref 0.0–40.0)

## 2020-12-24 LAB — COMPREHENSIVE METABOLIC PANEL
ALT: 18 U/L (ref 0–53)
AST: 18 U/L (ref 0–37)
Albumin: 4.7 g/dL (ref 3.5–5.2)
Alkaline Phosphatase: 57 U/L (ref 39–117)
BUN: 23 mg/dL (ref 6–23)
CO2: 31 mEq/L (ref 19–32)
Calcium: 10.2 mg/dL (ref 8.4–10.5)
Chloride: 104 mEq/L (ref 96–112)
Creatinine, Ser: 1.12 mg/dL (ref 0.40–1.50)
GFR: 66.35 mL/min (ref >60.00)
Glucose, Bld: 95 mg/dL (ref 70–99)
Potassium: 4.8 mEq/L (ref 3.5–5.1)
Sodium: 141 mEq/L (ref 135–145)
Total Bilirubin: 0.9 mg/dL (ref 0.2–1.2)
Total Protein: 7.6 g/dL (ref 6.0–8.3)

## 2020-12-24 LAB — CBC WITH DIFFERENTIAL/PLATELET
Basophils Absolute: 0 10*3/uL (ref 0.0–0.1)
Basophils Relative: 0.4 % (ref 0.0–3.0)
Eosinophils Absolute: 0.2 10*3/uL (ref 0.0–0.7)
Eosinophils Relative: 2.4 % (ref 0.0–5.0)
HCT: 44 % (ref 39.0–52.0)
Hemoglobin: 14.6 g/dL (ref 13.0–17.0)
Lymphocytes Relative: 16.8 % (ref 12.0–46.0)
Lymphs Abs: 1.1 10*3/uL (ref 0.7–4.0)
MCHC: 33.1 g/dL (ref 30.0–36.0)
MCV: 90.4 fl (ref 78.0–100.0)
Monocytes Absolute: 0.5 10*3/uL (ref 0.1–1.0)
Monocytes Relative: 7.6 % (ref 3.0–12.0)
Neutro Abs: 4.7 10*3/uL (ref 1.4–7.7)
Neutrophils Relative %: 72.8 % (ref 43.0–77.0)
Platelets: 260 10*3/uL (ref 150.0–400.0)
RBC: 4.87 Mil/uL (ref 4.22–5.81)
RDW: 14.6 % (ref 11.5–15.5)
WBC: 6.5 10*3/uL (ref 4.0–10.5)

## 2020-12-24 LAB — HEMOGLOBIN A1C: Hgb A1c MFr Bld: 5.6 % (ref 4.6–6.5)

## 2020-12-24 LAB — VITAMIN D 25 HYDROXY (VIT D DEFICIENCY, FRACTURES): VITD: 73.03 ng/mL (ref 30.00–100.00)

## 2020-12-24 LAB — TSH: TSH: 1.43 u[IU]/mL (ref 0.35–4.50)

## 2020-12-24 LAB — VITAMIN B12: Vitamin B-12: 626 pg/mL (ref 211–911)

## 2020-12-24 MED ORDER — TRAZODONE HCL 100 MG PO TABS: 100.0000 mg | ORAL_TABLET | Freq: Every evening | ORAL | 0 refills | Status: DC | PRN

## 2020-12-24 NOTE — Addendum Note (Signed)
Addended by: Westley Hummer B on: 12/24/2020 04:13 PM   Modules accepted: Orders

## 2020-12-24 NOTE — Patient Instructions (Signed)
-Nice seeing you today!!  -Lab work today; will notify you once results are available.  -Schedule follow up in 6 months or sooner as needed.   Preventive Care 71 Years and Older, Male Preventive care refers to lifestyle choices and visits with your health care provider that can promote health and wellness. This includes:  A yearly physical exam. This is also called an annual wellness visit.  Regular dental and eye exams.  Immunizations.  Screening for certain conditions.  Healthy lifestyle choices, such as: ? Eating a healthy diet. ? Getting regular exercise. ? Not using drugs or products that contain nicotine and tobacco. ? Limiting alcohol use. What can I expect for my preventive care visit? Physical exam Your health care provider will check your:  Height and weight. These may be used to calculate your BMI (body mass index). BMI is a measurement that tells if you are at a healthy weight.  Heart rate and blood pressure.  Body temperature.  Skin for abnormal spots. Counseling Your health care provider may ask you questions about your:  Past medical problems.  Family's medical history.  Alcohol, tobacco, and drug use.  Emotional well-being.  Home life and relationship well-being.  Sexual activity.  Diet, exercise, and sleep habits.  History of falls.  Memory and ability to understand (cognition).  Work and work environment.  Access to firearms. What immunizations do I need? Vaccines are usually given at various ages, according to a schedule. Your health care provider will recommend vaccines for you based on your age, medical history, and lifestyle or other factors, such as travel or where you work.   What tests do I need? Blood tests  Lipid and cholesterol levels. These may be checked every 5 years, or more often depending on your overall health.  Hepatitis C test.  Hepatitis B test. Screening  Lung cancer screening. You may have this screening  every year starting at age 55 if you have a 30-pack-year history of smoking and currently smoke or have quit within the past 15 years.  Colorectal cancer screening. ? All adults should have this screening starting at age 50 and continuing until age 75. ? Your health care provider may recommend screening at age 45 if you are at increased risk. ? You will have tests every 1-10 years, depending on your results and the type of screening test.  Prostate cancer screening. Recommendations will vary depending on your family history and other risks.  Genital exam to check for testicular cancer or hernias.  Diabetes screening. ? This is done by checking your blood sugar (glucose) after you have not eaten for a while (fasting). ? You may have this done every 1-3 years.  Abdominal aortic aneurysm (AAA) screening. You may need this if you are a current or former smoker.  STD (sexually transmitted disease) testing, if you are at risk. Follow these instructions at home: Eating and drinking  Eat a diet that includes fresh fruits and vegetables, whole grains, lean protein, and low-fat dairy products. Limit your intake of foods with high amounts of sugar, saturated fats, and salt.  Take vitamin and mineral supplements as recommended by your health care provider.  Do not drink alcohol if your health care provider tells you not to drink.  If you drink alcohol: ? Limit how much you have to 0-2 drinks a day. ? Be aware of how much alcohol is in your drink. In the [place] one drink equals one 12 oz bottle of beer (355 mL),   one 5 oz glass of wine (148 mL), or one 1 oz glass of hard liquor (44 mL).   Lifestyle  Take daily care of your teeth and gums. Brush your teeth every morning and night with fluoride toothpaste. Floss one time each day.  Stay active. Exercise for at least 30 minutes 5 or more days each week.  Do not use any products that contain nicotine or tobacco, such as cigarettes, e-cigarettes,  and chewing tobacco. If you need help quitting, ask your health care provider.  Do not use drugs.  If you are sexually active, practice safe sex. Use a condom or other form of protection to prevent STIs (sexually transmitted infections).  Talk with your health care provider about taking a low-dose aspirin or statin.  Find healthy ways to cope with stress, such as: ? Meditation, yoga, or listening to music. ? Journaling. ? Talking to a trusted person. ? Spending time with friends and family. Safety  Always wear your seat belt while driving or riding in a vehicle.  Do not drive: ? If you have been drinking alcohol. Do not ride with someone who has been drinking. ? When you are tired or distracted. ? While texting.  Wear a helmet and other protective equipment during sports activities.  If you have firearms in your house, make sure you follow all gun safety procedures. What's next?  Visit your health care provider once a year for an annual wellness visit.  Ask your health care provider how often you should have your eyes and teeth checked.  Stay up to date on all vaccines. This information is not intended to replace advice given to you by your health care provider. Make sure you discuss any questions you have with your health care provider. Document Revised: 08/21/2019 Document Reviewed: 11/16/2018 Elsevier Patient Education  2021 Elsevier Inc.  

## 2020-12-24 NOTE — Progress Notes (Signed)
Established Patient Office Visit     This visit occurred during the SARS-CoV-2 public health emergency.  Safety protocols were in place, including screening questions prior to the visit, additional usage of staff PPE, and extensive cleaning of exam room while observing appropriate contact time as indicated for disinfecting solutions.    CC/Reason for Visit: Annual preventive exam and subsequent Medicare wellness visit  HPI: Ruben Chavez is a 70 y.o. male who is coming in today for the above mentioned reasons. Past Medical History is significant for: mentioned reasons. Past Medical History is significant for: Well-controlled hypertension, well-controlled hyperlipidemia, history of prostate cancer, history of right hip replacement in 2019 due to osteoarthritis. He suffered a right MCA CVA in February 2021.  He has been on Wellbutrin since the stroke due to decreased patience and anger issues.  No acute issues today.  He had a colonoscopy in 2017.  He follows with his urologist due to his history of prostate cancer on a yearly basis.  All immunizations are up-to-date including COVID x3 and influenza.   Past Medical/Surgical History: Past Medical History:  Diagnosis Date  . Elevated PSA   . Hyperlipidemia    pt says not anymore  . Hypertension   . Prostate cancer ([place]) 2013    Past Surgical History:  Procedure Laterality Date  . bilateral inguinal hernia  2008  . BUBBLE STUDY  01/28/2020   Procedure: BUBBLE STUDY;  Surgeon: [name], MD;  Location: [facility];  Service: Cardiovascular;;  . CYST REMOVAL TRUNK  2016   sebaceous cyst  . CYSTOSCOPY  04/22/2016   Procedure: CYSTOSCOPY;  Surgeon: [name], MD;  Location: [facility];  Service: Urology;;  . IR ANGIO INTRA EXTRACRAN SEL COM CAROTID INNOMINATE BILAT MOD SED  05/30/2020  . IR ANGIO VERTEBRAL SEL SUBCLAVIAN INNOMINATE UNI R MOD SED  01/25/2020  . IR ANGIO VERTEBRAL SEL VERTEBRAL BILAT MOD  SED  05/30/2020  . IR CT HEAD LTD  01/25/2020  . IR PERCUTANEOUS ART THROMBECTOMY/INFUSION INTRACRANIAL INC DIAG ANGIO  01/25/2020  . IR US GUIDE VASC ACCESS RIGHT  05/30/2020  . KNEE ARTHROSCOPY  2007   right  . LOOP RECORDER INSERTION N/A 01/28/2020   Procedure: LOOP RECORDER INSERTION;  Surgeon: [name], MD;  Location: [facility];  Service: Cardiovascular;  Laterality: N/A;  . PROSTATE BIOPSY  2008  . PROSTATE BIOPSY  2013  . PROSTATE BIOPSY  01/28/2016  . RADIOACTIVE SEED IMPLANT N/A 04/22/2016   Procedure: RADIOACTIVE SEED IMPLANT/BRACHYTHERAPY IMPLANT;  Surgeon: [name], MD;  Location: [facility];  Service: Urology;  Laterality: N/A;  . RADIOLOGY WITH ANESTHESIA N/A 01/24/2020   Procedure: IR WITH ANESTHESIA;  Surgeon: [name], MD;  Location: [place];  Service: Radiology;  Laterality: N/A;  . TEE WITHOUT CARDIOVERSION N/A 01/28/2020   Procedure: TRANSESOPHAGEAL ECHOCARDIOGRAM (TEE);  Surgeon: [name], MD;  Location: [facility];  Service: Cardiovascular;  Laterality: N/A;  . TONSILLECTOMY    . TOTAL HIP ARTHROPLASTY  2001   left  . TOTAL HIP ARTHROPLASTY Right 01/18/2018   Procedure: RIGHT TOTAL HIP ARTHROPLASTY ANTERIOR APPROACH;  Surgeon: [name], MD;  Location: [facility];  Service: Orthopedics;  Laterality: Right;    Social History:  reports that he quit smoking about 21 years ago. His smoking use included cigarettes. He has a 32.00 pack-year smoking history. He has never used smokeless tobacco. He reports current alcohol use of about 14.0  standard drinks of alcohol per week. He reports that he does not use drugs.  Allergies: Allergies  Allergen Reactions  . Sulfa Antibiotics Swelling and Other (See Comments)    Swelling in ankles   . Advil [Ibuprofen] Other (See Comments)    "Dots on chest" (Petechiae)  . Aleve [Naproxen] Other (See Comments)    "Dots on chest" (Petechiae)  . Betadine [Povidone Iodine] Itching and  Rash  . Chloroxylenol (Antiseptic) Itching, Rash and Other (See Comments)    PCMX surgical sterilizing scrub  . Poison Ivy Extract Rash  . Povidone-Iodine Itching and Rash    BETADINE    Family History:  Family History  Problem Relation Age of Onset  . Pancreatic cancer Mother   . Heart disease Father   . Colon cancer Neg Hx      Current Outpatient Medications:  .  acetaminophen (TYLENOL) 325 MG tablet, Take 2 tablets (650 mg total) by mouth every 4 (four) hours as needed for mild pain (or temp > 37.5 C (99.5 F))., Disp:  , Rfl:  .  aspirin EC 325 MG EC tablet, Take 1 tablet (325 mg total) by mouth daily., Disp: 30 tablet, Rfl: 0 .  atorvastatin (LIPITOR) 20 MG tablet, TAKE 1 TABLET(20 MG) BY MOUTH DAILY AT 6 PM, Disp: 90 tablet, Rfl: 1 .  buPROPion (WELLBUTRIN XL) 300 MG 24 hr tablet, TAKE 1 TABLET(300 MG) BY MOUTH DAILY, Disp: 90 tablet, Rfl: 0 .  folic acid (FOLVITE) 1 MG tablet, TAKE 1 TABLET(1 MG) BY MOUTH DAILY, Disp: 90 tablet, Rfl: 1 .  lisinopril-hydrochlorothiazide (ZESTORETIC) 20-25 MG tablet, TAKE 1 TABLET BY MOUTH DAILY, Disp: 90 tablet, Rfl: 1 .  Magnesium 250 MG TABS, Take 250 mg by mouth daily. , Disp: , Rfl:  .  Multiple Vitamin (MULTIVITAMIN WITH MINERALS) TABS tablet, Take 1 tablet by mouth daily., Disp:  , Rfl:  .  senna-docusate (SENOKOT-S) 8.6-50 MG tablet, Take 2 tablets by mouth daily., Disp:  , Rfl:  .  pantoprazole (PROTONIX) 40 MG tablet, TAKE 1 TABLET(40 MG) BY MOUTH DAILY (Patient not taking: Reported on 12/24/2020), Disp: 90 tablet, Rfl: 1 .  traZODone (DESYREL) 100 MG tablet, Take 1 tablet (100 mg total) by mouth at bedtime as needed for sleep., Disp: 90 tablet, Rfl: 0  Review of Systems:  Constitutional: Denies fever, chills, diaphoresis, appetite change and fatigue.  HEENT: Denies photophobia, eye pain, redness, hearing loss, ear pain, congestion, sore throat, rhinorrhea, sneezing, mouth sores, trouble swallowing, neck pain, neck stiffness and tinnitus.    Respiratory: Denies SOB, DOE, cough, chest tightness,  and wheezing.   Cardiovascular: Denies chest pain, palpitations and leg swelling.  Gastrointestinal: Denies nausea, vomiting, abdominal pain, diarrhea, constipation, blood in stool and abdominal distention.  Genitourinary: Denies dysuria, urgency, frequency, hematuria, flank pain and difficulty urinating.  Endocrine: Denies: hot or cold intolerance, sweats, changes in hair or nails, polyuria, polydipsia. Musculoskeletal: Denies myalgias, back pain, joint swelling, arthralgias and gait problem.  Skin: Denies pallor, rash and wound.  Neurological: Denies dizziness, seizures, syncope, weakness, light-headedness, numbness and headaches.  Hematological: Denies adenopathy. Easy bruising, personal or family bleeding history  Psychiatric/Behavioral: Denies suicidal ideation, mood changes, confusion, nervousness, sleep disturbance and agitation    Physical Exam: Vitals:   12/24/20 1420  BP: 102/68  Pulse: (!) 56  Temp: 97.9 F (36.6 C)  TempSrc: Oral  SpO2: 95%  Weight: 166 lb 4.8 oz (75.4 kg)  Height: 5\' 8"  (1.727 m)    Body mass index  is 25.29 kg/m.  Constitutional: NAD, calm, comfortable Eyes: PERRL, lids and conjunctivae normal, wears corrective lenses ENMT: Mucous membranes are moist. Posterior pharynx clear of any exudate or lesions. Normal dentition. Tympanic membrane is pearly white, no erythema or bulging. Neck: normal, supple, no masses, no thyromegaly Respiratory: clear to auscultation bilaterally, no wheezing, no crackles. Normal respiratory effort. No accessory muscle use.  Cardiovascular: Regular rate and rhythm, no murmurs / rubs / gallops. No extremity edema. 2+ pedal pulses.  Abdomen: no tenderness, no masses palpated. No hepatosplenomegaly. Bowel sounds positive.  Musculoskeletal: no clubbing / cyanosis. No joint deformity upper and lower extremities. Good ROM, no contractures. Normal muscle tone.  Skin: no  rashes, lesions, ulcers. No induration Neurologic: CN 2-12 grossly intact.  Left upper extremity hemiparesis. Psychiatric: Normal judgment and insight. Alert and oriented x 3. Normal mood.    Subsequent Medicare wellness visit   1. Risk factors, based on past  M,S,F -cardiovascular disease risk factors include age, gender, history of hypertension, history of hyperlipidemia, prior CVA   2.  Physical activities: Nothing other than routine housework   3.  Depression/mood:  Stable, not depressed   4.  Hearing:  No perceived issues   5.  ADL's: Independent in all ADLs   6.  Fall risk:  Moderate fall risk due to recent CVA   7.  Home safety: No problems identified   8.  Height weight, and visual acuity: height and weight as above, vision:   Visual Acuity Screening   Right eye Left eye Both eyes  Without correction:     With correction: 20/25 20/25 20/25      9.  Counseling:  Continue risk factor modification, increase physical activity   10. Lab orders based on risk factors: Laboratory update will be reviewed   11. Referral :  None today   12. Care plan:  Follow-up with me in 6 months   13. Cognitive assessment:  No cognitive impairment   14. Screening: Patient provided with a written and personalized 5-10 year screening schedule in the AVS.   yes   15. Provider List Update:   PCP, cardiology, urology  16. Advance Directives: Full code   [facility] Office Visit from 10/09/2019 in [place] at [place]  PHQ-9 Total Score 0      Fall Risk  03/28/2020 03/28/2020 02/25/2020 10/09/2019 02/21/2019  Falls in the past year? 0 0 0 0 0  Number falls in past yr: - - - 0 0  Injury with Fall? - - - 0 0     Impression and Plan:  Encounter for preventive health examination  -He has routine eye and dental care. -Immunizations are up-to-date including COVID x3. -Screening labs today. -Healthy lifestyle discussed in detail. -He is overdue for screening colonoscopy, he  will call GI to arrange. -He sees urology on a yearly basis due to his history of prostate cancer.  Right middle cerebral artery stroke (HCC) Left hemiparesis (HCC) Spasticity as late effect of cerebrovascular accident (CVA)  - Plan: traZODone (DESYREL) 100 MG tablet -At baseline.  Hyperlipidemia, unspecified hyperlipidemia type  - Plan: Lipid panel -Last LDL was 54 in February 21, goal less than 70.  Prostate cancer ([place]) -Followed by urology.  Essential hypertension  -Well-controlled.  History of colonic polyps -He had a colonoscopy in January 2017 and is a 5-year callback. -He will contact GI to schedule.     Patient Instructions   -Nice seeing you today!!  -Lab work today; will notify  you once results are available.  -Schedule follow up in 6 months or sooner as needed.   Preventive Care 41 Years and Older, Male Preventive care refers to lifestyle choices and visits with your health care provider that can promote health and wellness. This includes:  A yearly physical exam. This is also called an annual wellness visit.  Regular dental and eye exams.  Immunizations.  Screening for certain conditions.  Healthy lifestyle choices, such as: ? Eating a healthy diet. ? Getting regular exercise. ? Not using drugs or products that contain nicotine and tobacco. ? Limiting alcohol use. What can I expect for my preventive care visit? Physical exam Your health care provider will check your:  Height and weight. These may be used to calculate your BMI (body mass index). BMI is a measurement that tells if you are at a healthy weight.  Heart rate and blood pressure.  Body temperature.  Skin for abnormal spots. Counseling Your health care provider may ask you questions about your:  Past medical problems.  Family's medical history.  Alcohol, tobacco, and drug use.  Emotional well-being.  Home life and relationship well-being.  Sexual activity.  Diet,  exercise, and sleep habits.  History of falls.  Memory and ability to understand (cognition).  Work and work Statistician.  Access to firearms. What immunizations do I need? Vaccines are usually given at various ages, according to a schedule. Your health care provider will recommend vaccines for you based on your age, medical history, and lifestyle or other factors, such as travel or where you work.   What tests do I need? Blood tests  Lipid and cholesterol levels. These may be checked every 5 years, or more often depending on your overall health.  Hepatitis C test.  Hepatitis B test. Screening  Lung cancer screening. You may have this screening every year starting at age 80 if you have a 30-pack-year history of smoking and currently smoke or have quit within the past 15 years.  Colorectal cancer screening. ? All adults should have this screening starting at age 70 and continuing until age 74. ? Your health care provider may recommend screening at age 46 if you are at increased risk. ? You will have tests every 1-10 years, depending on your results and the type of screening test.  Prostate cancer screening. Recommendations will vary depending on your family history and other risks.  Genital exam to check for testicular cancer or hernias.  Diabetes screening. ? This is done by checking your blood sugar (glucose) after you have not eaten for a while (fasting). ? You may have this done every 1-3 years.  Abdominal aortic aneurysm (AAA) screening. You may need this if you are a current or former smoker.  STD (sexually transmitted disease) testing, if you are at risk. Follow these instructions at home: Eating and drinking  Eat a diet that includes fresh fruits and vegetables, whole grains, lean protein, and low-fat dairy products. Limit your intake of foods with high amounts of sugar, saturated fats, and salt.  Take vitamin and mineral supplements as recommended by your health  care provider.  Do not drink alcohol if your health care provider tells you not to drink.  If you drink alcohol: ? Limit how much you have to 0-2 drinks a day. ? Be aware of how much alcohol is in your drink. In the [place] one drink equals one 12 oz bottle of beer (355 mL), one 5 oz glass of wine (148 mL), or  one 1 oz glass of hard liquor (44 mL).   Lifestyle  Take daily care of your teeth and gums. Brush your teeth every morning and night with fluoride toothpaste. Floss one time each day.  Stay active. Exercise for at least 30 minutes 5 or more days each week.  Do not use any products that contain nicotine or tobacco, such as cigarettes, e-cigarettes, and chewing tobacco. If you need help quitting, ask your health care provider.  Do not use drugs.  If you are sexually active, practice safe sex. Use a condom or other form of protection to prevent STIs (sexually transmitted infections).  Talk with your health care provider about taking a low-dose aspirin or statin.  Find healthy ways to cope with stress, such as: ? Meditation, yoga, or listening to music. ? Journaling. ? Talking to a trusted person. ? Spending time with friends and family. Safety  Always wear your seat belt while driving or riding in a vehicle.  Do not drive: ? If you have been drinking alcohol. Do not ride with someone who has been drinking. ? When you are tired or distracted. ? While texting.  Wear a helmet and other protective equipment during sports activities.  If you have firearms in your house, make sure you follow all gun safety procedures. What's next?  Visit your health care provider once a year for an annual wellness visit.  Ask your health care provider how often you should have your eyes and teeth checked.  Stay up to date on all vaccines. This information is not intended to replace advice given to you by your health care provider. Make sure you discuss any questions you have with your health  care provider. Document Revised: 08/21/2019 Document Reviewed: 11/16/2018 Elsevier Patient Education  2021 [place], MD [facility]

## 2020-12-26 ENCOUNTER — Telehealth: Payer: Self-pay | Admitting: Internal Medicine

## 2020-12-26 DIAGNOSIS — C61 Malignant neoplasm of prostate: Secondary | ICD-10-CM

## 2020-12-26 NOTE — Telephone Encounter (Signed)
Spoke with patient and a lab appointment scheduled. 

## 2020-12-26 NOTE — Telephone Encounter (Signed)
Pt is calling wanting to get his PSA results and would like for it to be mailed to his home address.  And pt stated if it wasn't done he is not going to come back due to him living a long ways away and that we should still have the blood from 12/24/2020.  Pt stated that he did not ask for it and that it has always been part of his CPE in the past 10 years and not sure why he should start asking for it now.

## 2020-12-26 NOTE — Telephone Encounter (Signed)
He follows with urology for his history. I am assuming they do his PSAs?

## 2020-12-29 ENCOUNTER — Other Ambulatory Visit: Payer: Self-pay

## 2020-12-29 ENCOUNTER — Telehealth (HOSPITAL_COMMUNITY): Payer: Self-pay

## 2020-12-29 ENCOUNTER — Other Ambulatory Visit (INDEPENDENT_AMBULATORY_CARE_PROVIDER_SITE_OTHER): Payer: PPO

## 2020-12-29 ENCOUNTER — Telehealth: Payer: Self-pay

## 2020-12-29 DIAGNOSIS — C61 Malignant neoplasm of prostate: Secondary | ICD-10-CM

## 2020-12-29 NOTE — Telephone Encounter (Signed)
Returned pt's call. He would like to come in for a consult, no answer, left vm for him to call me or Anderson Malta. AW

## 2020-12-29 NOTE — Telephone Encounter (Signed)
Patient called stating Ruben Chavez from Neurology recommended Botox for him and is suppose to contact you about that. Patient is coming in on Friday and would like to discuss that with you so he can get it scheduled at check out.

## 2020-12-30 LAB — PSA: PSA: 0.09 ng/mL — ABNORMAL LOW (ref 0.10–4.00)

## 2020-12-31 ENCOUNTER — Telehealth: Payer: Self-pay | Admitting: Internal Medicine

## 2020-12-31 NOTE — Telephone Encounter (Signed)
The patient called wanted to know why he hasn't received a call about the referral for his Lipoma of neck.  He told me to call him back because I was interrupting his GPS.  I called the patient back to let him know that the for [facility].  The patient asked if I can text the number to him and I stated that I can send it to his MyChart and he said you can't text from your personal phone and I told him that is a HIPPA violation and he continued yelling and told me to send it to his e-mail.  Deborah sent him the information to his MyChart for him to contact [employer].

## 2020-12-31 NOTE — Telephone Encounter (Signed)
I called [facility] to check the referral status because the patient referral has been moved to review on [facility]; without any notes.   I was told to  Leave a message for the referral coordinator at [facility]. I left a msg on her VM to contact the pt for an update on the status of his referral  And call the patient back

## 2021-01-02 ENCOUNTER — Encounter: Payer: Self-pay | Admitting: Physical Medicine and Rehabilitation

## 2021-01-02 ENCOUNTER — Encounter: Payer: PPO | Attending: Physical Medicine and Rehabilitation | Admitting: Physical Medicine and Rehabilitation

## 2021-01-02 ENCOUNTER — Other Ambulatory Visit: Payer: Self-pay

## 2021-01-02 VITALS — BP 123/74 | HR 68 | Temp 98.0°F | Ht 68.0 in | Wt 171.1 lb

## 2021-01-02 DIAGNOSIS — R252 Cramp and spasm: Secondary | ICD-10-CM | POA: Diagnosis not present

## 2021-01-02 DIAGNOSIS — G8194 Hemiplegia, unspecified affecting left nondominant side: Secondary | ICD-10-CM | POA: Diagnosis not present

## 2021-01-02 DIAGNOSIS — I69398 Other sequelae of cerebral infarction: Secondary | ICD-10-CM | POA: Diagnosis not present

## 2021-01-02 NOTE — Patient Instructions (Signed)
Patient is a 71 yr old male with R MCA CVA s/p loop recorder with hx of prostate CA s/p 3 yr ago; HTN, HLD, low back pain and LUE edema here for f/u.    1. Went over Botox with pt with pt- can help flexibility not strength -  Will schedule with Dr Letta Pate or Dr Posey Pronto- will need every 3 months or so; is a treatment not a cure. Might take a few treatments to get to ideal dose.   2. Will wait on spasticity Oral meds since will be doing Botox. Esp because not having pain.   3. Discussed constraint induced therapy- cover R hand with oven mitt- and then try to use LUE for 6+ hours/day.   4.  Can use constraint induced up to 10 years past stroke.     5. F/U in 6 weeks- for f/u on spasticity injections

## 2021-01-02 NOTE — Progress Notes (Addendum)
Subjective:    Patient ID: Ruben Chavez, male    DOB: July 23, 1951, 70 y.o.   MRN: [id number]  HPI  Patient is a 70 yr old male with R MCA CVA s/p loop recorder with hx of prostate CA s/p 3 yr ago; HTN, HLD, low back pain and LUE edema here for f/u.   Saw IR again 6/21- - Dr Patrecia Pour- R MCA still occluded in June 2021- body had compensated with a lot of additional vessels.    Still has weakness on L side.   Has some food they used to love, now tastes like cardboard.   Hasn't put back on weight- lost 10-20 lbs over the hospitalization last year- hasn't put back on.   Sees Ruben Chavez when goes to "see Dr. [name]  Never sees Doctor-  Having more spasticity- Neuro suggested Botox for LUE.  L hand/fingers and wrist are the main issues.   Also wears resting hand splint at night at home-   Not painful in LE except if puts too much weight on L hand/wrist.    Pain Inventory Average Pain 0 Pain Right Now 0 My pain is no pain  In the last 24 hours, has pain interfered with the following? General activity 0 Relation with others 0 Enjoyment of life 0 What TIME of day is your pain at its worst? na Sleep (in general) Good  Pain is worse with: no pain Pain improves with: no pain Relief from Meds: no pain  Family History  Problem Relation Age of Onset  . Pancreatic cancer Mother   . Heart disease Father   . Colon cancer Neg Hx    Social History   Socioeconomic History  . Marital status: Married    Spouse name: Not on file  . Number of children: Not on file  . Years of education: Not on file  . Highest education level: Not on file  Occupational History  . Not on file  Tobacco Use  . Smoking status: Former Smoker    Packs/day: 1.00    Years: 32.00    Pack years: 32.00    Types: Cigarettes    Quit date: 06/06/1999    Years since quitting: 21.5  . Smokeless tobacco: Never Used  Vaping Use  . Vaping Use: Never used  Substance and Sexual Activity  . Alcohol  use: Yes    Alcohol/week: 14.0 standard drinks    Types: 14 Shots of liquor per week  . Drug use: No  . Sexual activity: Yes  Other Topics Concern  . Not on file  Social History Narrative  . Not on file   Social Determinants of Health   Financial Resource Strain: Not on file  Food Insecurity: Not on file  Transportation Needs: Not on file  Physical Activity: Not on file  Stress: Not on file  Social Connections: Not on file   Past Surgical History:  Procedure Laterality Date  . bilateral inguinal hernia  2008  . BUBBLE STUDY  01/28/2020   Procedure: BUBBLE STUDY;  Surgeon: [name], MD;  Location: [place];  Service: Cardiovascular;;  . CYST REMOVAL TRUNK  2016   sebaceous cyst  . CYSTOSCOPY  04/22/2016   Procedure: CYSTOSCOPY;  Surgeon: [name], MD;  Location: [facility];  Service: Urology;;  . IR ANGIO INTRA EXTRACRAN SEL COM CAROTID INNOMINATE BILAT MOD SED  05/30/2020  . IR ANGIO VERTEBRAL SEL SUBCLAVIAN INNOMINATE UNI R MOD SED  01/25/2020  . IR  ANGIO VERTEBRAL SEL VERTEBRAL BILAT MOD SED  05/30/2020  . IR CT HEAD LTD  01/25/2020  . IR PERCUTANEOUS ART THROMBECTOMY/INFUSION INTRACRANIAL INC DIAG ANGIO  01/25/2020  . IR US GUIDE VASC ACCESS RIGHT  05/30/2020  . KNEE ARTHROSCOPY  2007   right  . LOOP RECORDER INSERTION N/A 01/28/2020   Procedure: LOOP RECORDER INSERTION;  Surgeon: [name], MD;  Location: [place] [facility];  Service: Cardiovascular;  Laterality: N/A;  . PROSTATE BIOPSY  2008  . PROSTATE BIOPSY  2013  . PROSTATE BIOPSY  01/28/2016  . RADIOACTIVE SEED IMPLANT N/A 04/22/2016   Procedure: RADIOACTIVE SEED IMPLANT/BRACHYTHERAPY IMPLANT;  Surgeon: [name], MD;  Location: [facility];  Service: Urology;  Laterality: N/A;  . RADIOLOGY WITH ANESTHESIA N/A 01/24/2020   Procedure: IR WITH ANESTHESIA;  Surgeon: [name], MD;  Location: [place];  Service: Radiology;  Laterality: N/A;  . TEE WITHOUT  CARDIOVERSION N/A 01/28/2020   Procedure: TRANSESOPHAGEAL ECHOCARDIOGRAM (TEE);  Surgeon: [name], MD;  Location: [facility];  Service: Cardiovascular;  Laterality: N/A;  . TONSILLECTOMY    . TOTAL HIP ARTHROPLASTY  2001   left  . TOTAL HIP ARTHROPLASTY Right 01/18/2018   Procedure: RIGHT TOTAL HIP ARTHROPLASTY ANTERIOR APPROACH;  Surgeon: [name], MD;  Location: [facility];  Service: Orthopedics;  Laterality: Right;   Past Surgical History:  Procedure Laterality Date  . bilateral inguinal hernia  2008  . BUBBLE STUDY  01/28/2020   Procedure: BUBBLE STUDY;  Surgeon: [name], MD;  Location: [place];  Service: Cardiovascular;;  . CYST REMOVAL TRUNK  2016   sebaceous cyst  . CYSTOSCOPY  04/22/2016   Procedure: CYSTOSCOPY;  Surgeon: [name], MD;  Location: [facility];  Service: Urology;;  . IR ANGIO INTRA EXTRACRAN SEL COM CAROTID INNOMINATE BILAT MOD SED  05/30/2020  . IR ANGIO VERTEBRAL SEL SUBCLAVIAN INNOMINATE UNI R MOD SED  01/25/2020  . IR ANGIO VERTEBRAL SEL VERTEBRAL BILAT MOD SED  05/30/2020  . IR CT HEAD LTD  01/25/2020  . IR PERCUTANEOUS ART THROMBECTOMY/INFUSION INTRACRANIAL INC DIAG ANGIO  01/25/2020  . IR US GUIDE VASC ACCESS RIGHT  05/30/2020  . KNEE ARTHROSCOPY  2007   right  . LOOP RECORDER INSERTION N/A 01/28/2020   Procedure: LOOP RECORDER INSERTION;  Surgeon: [name], MD;  Location: [place] [facility];  Service: Cardiovascular;  Laterality: N/A;  . PROSTATE BIOPSY  2008  . PROSTATE BIOPSY  2013  . PROSTATE BIOPSY  01/28/2016  . RADIOACTIVE SEED IMPLANT N/A 04/22/2016   Procedure: RADIOACTIVE SEED IMPLANT/BRACHYTHERAPY IMPLANT;  Surgeon: [name], MD;  Location: [facility];  Service: Urology;  Laterality: N/A;  . RADIOLOGY WITH ANESTHESIA N/A 01/24/2020   Procedure: IR WITH ANESTHESIA;  Surgeon: [name], MD;  Location: [place];  Service: Radiology;  Laterality: N/A;  . TEE WITHOUT  CARDIOVERSION N/A 01/28/2020   Procedure: TRANSESOPHAGEAL ECHOCARDIOGRAM (TEE);  Surgeon: [name], MD;  Location: [facility]COPY;  Service: Cardiovascular;  Laterality: N/A;  . TONSILLECTOMY    . TOTAL HIP ARTHROPLASTY  2001   left  . TOTAL HIP ARTHROPLASTY Right 01/18/2018   Procedure: RIGHT TOTAL HIP ARTHROPLASTY ANTERIOR APPROACH;  Surgeon: [name], MD;  Location: [facility];  Service: Orthopedics;  Laterality: Right;   Past Medical History:  Diagnosis Date  . Elevated PSA   . Hyperlipidemia    pt says not anymore  . Hypertension   . Prostate  cancer ([place]) 2013   BP 123/74   Pulse 68   Temp 98 F (36.7 C)   Ht 5\' 8"  (1.727 m)   Wt 171 lb 1.6 oz (77.6 kg)   SpO2 98%   BMI 26.02 kg/m   Opioid Risk Score:   Fall Risk Score:  `1  Depression screen PHQ 2/9  Depression screen [facility] 2/9 12/24/2020 03/19/2020 10/09/2019 02/21/2019 08/21/2018 10/05/2016 05/04/2016  Decreased Interest 0 0 0 0 0 0 0  Down, Depressed, Hopeless 0 0 0 0 0 0 0  PHQ - 2 Score 0 0 0 0 0 0 0  Altered sleeping - - 0 - - - -  Tired, decreased energy 0 - 0 - - - -  Change in appetite 0 - 0 - - - -  Feeling bad or failure about yourself  0 - 0 - - - -  Trouble concentrating 0 - 0 - - - -  Moving slowly or fidgety/restless 0 - 0 - - - -  Suicidal thoughts 0 - 0 - - - -  PHQ-9 Score - - 0 - - - -  Difficult doing work/chores - - Not difficult at all - - - -  Some recent data might be hidden    Review of Systems An entire ROS was completed and found to be negative except for HPI    Objective:   Physical Exam Awake, alert, appropriate, no assistive device, appropriate, talkative, NAD Wearing resting L thumb abduction splint Pt has mild LUE radial deviation Can get MCPs to neutral on L hand Cannot get PIPs to neutral on LUE  No clonus in L wrist  MAS of 3 in L wrist and 3 in fingers MAS of 0 in L elbow and L shoulder Strongly (+) Hoffmans on LUE (hx of compound L wrist fx in teen years).    MS: LUE- deltoid 5-/5, biceps 5-/5, triceps 5-/5, WE 2/5, grip 2/5, and finger abd 2-/5 Neuro: decreased sensation./absent of LUE LLUE intact to light touch (says no temperature sensation on LUE as well)    Assessment & Plan:   Patient is a 70 yr old male with R MCA CVA s/p loop recorder with hx of prostate CA s/p 3 yr ago; HTN, HLD, low back pain and LUE edema here for f/u.    1. Went over Botox with pt with pt- can help flexibility not strength -  Will schedule with Dr Letta Pate or Dr Posey Pronto- will need every 3 months or so; is a treatment not a cure. Might take a few treatments to get to ideal dose. Max 200 units  2. Will wait on spasticity Oral meds since will be doing Botox. Esp because not having pain.   3. Discussed constraint induced therapy- cover R hand with oven mitt- and then try to use LUE for 6+ hours/day.   4.  Can use constraint induced up to 10 years past stroke.     5. F/U in 6 weeks- for f/u on spasticity injections  I spent a total of 30 minutes on visit- as detailed above.   Going over Botox and risks/benefits how it works and prognosis.

## 2021-01-03 LAB — CUP PACEART REMOTE DEVICE CHECK
Date Time Interrogation Session: 20220126230651
Implantable Pulse Generator Implant Date: 20210222

## 2021-01-05 ENCOUNTER — Ambulatory Visit (INDEPENDENT_AMBULATORY_CARE_PROVIDER_SITE_OTHER): Payer: PPO

## 2021-01-05 DIAGNOSIS — I63411 Cerebral infarction due to embolism of right middle cerebral artery: Secondary | ICD-10-CM

## 2021-01-12 ENCOUNTER — Encounter: Payer: Self-pay | Admitting: Gastroenterology

## 2021-01-13 NOTE — Progress Notes (Signed)
Carelink Summary Report / Loop Recorder 

## 2021-01-18 ENCOUNTER — Other Ambulatory Visit: Payer: Self-pay | Admitting: Internal Medicine

## 2021-01-18 DIAGNOSIS — I10 Essential (primary) hypertension: Secondary | ICD-10-CM

## 2021-01-27 ENCOUNTER — Encounter: Payer: Self-pay | Admitting: Physical Medicine & Rehabilitation

## 2021-01-27 ENCOUNTER — Encounter: Payer: PPO | Attending: Physical Medicine and Rehabilitation | Admitting: Physical Medicine & Rehabilitation

## 2021-01-27 ENCOUNTER — Other Ambulatory Visit: Payer: Self-pay

## 2021-01-27 VITALS — BP 105/63 | HR 73 | Temp 98.3°F | Ht 68.0 in | Wt 168.0 lb

## 2021-01-27 DIAGNOSIS — G8110 Spastic hemiplegia affecting unspecified side: Secondary | ICD-10-CM | POA: Diagnosis not present

## 2021-01-27 NOTE — Progress Notes (Signed)
Dysport Injection for spasticity using needle EMG guidance  Dilution: 200 Units/ml Indication: Severe spasticity which interferes with ADL,mobility and/or  hygiene and is unresponsive to medication management and other conservative care Informed consent was obtained after describing risks and benefits of the procedure with the patient. This includes bleeding, bruising, infection, excessive weakness, or medication side effects. A REMS form is on file and signed. Needle:  needle electrode Number of units per muscle Left  Pronator teres 100U FDS 200U FDP 100U FCR 100U All injections were done after obtaining appropriate EMG activity and after negative drawback for blood. The patient tolerated the procedure well. Post procedure instructions were given. A followup appointment was made.   Discussed possible need for dosage adjustment - if grip strength reduced excessively then would change FDS dose to 100U dysport, if insufficient relaxation then increase FDP to 200U

## 2021-01-27 NOTE — Patient Instructions (Signed)

## 2021-01-29 DIAGNOSIS — D171 Benign lipomatous neoplasm of skin and subcutaneous tissue of trunk: Secondary | ICD-10-CM | POA: Diagnosis not present

## 2021-02-05 ENCOUNTER — Other Ambulatory Visit: Payer: Self-pay | Admitting: Internal Medicine

## 2021-02-05 DIAGNOSIS — F339 Major depressive disorder, recurrent, unspecified: Secondary | ICD-10-CM

## 2021-02-09 ENCOUNTER — Ambulatory Visit (INDEPENDENT_AMBULATORY_CARE_PROVIDER_SITE_OTHER): Payer: PPO

## 2021-02-09 ENCOUNTER — Ambulatory Visit: Payer: PPO | Admitting: Orthopedic Surgery

## 2021-02-09 ENCOUNTER — Ambulatory Visit: Payer: Self-pay

## 2021-02-09 DIAGNOSIS — M79671 Pain in right foot: Secondary | ICD-10-CM

## 2021-02-09 DIAGNOSIS — M79672 Pain in left foot: Secondary | ICD-10-CM

## 2021-02-09 DIAGNOSIS — I63519 Cerebral infarction due to unspecified occlusion or stenosis of unspecified middle cerebral artery: Secondary | ICD-10-CM

## 2021-02-09 DIAGNOSIS — M205X9 Other deformities of toe(s) (acquired), unspecified foot: Secondary | ICD-10-CM

## 2021-02-11 LAB — CUP PACEART REMOTE DEVICE CHECK
Date Time Interrogation Session: 20220228230820
Implantable Pulse Generator Implant Date: 20210222

## 2021-02-12 ENCOUNTER — Encounter: Payer: Self-pay | Admitting: Orthopedic Surgery

## 2021-02-12 NOTE — Progress Notes (Signed)
Office Visit Note   Patient: Ruben Chavez           Date of Birth: 06-21-1951           MRN: [id number] Visit Date: 02/09/2021              Requested by: Isaac Bliss, Rayford Halsted, MD [place],  [place] [id number] PCP: Isaac Bliss, Rayford Halsted, MD  Chief Complaint  Patient presents with  . Right Foot - Pain  . Left Foot - Pain      HPI: Patient is a 70 year old gentleman who is seen for initial evaluation for painful claw toes both feet.  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  Patient states he has had pain for the past 3 weeks but is not as painful today.  He wants to be proactive.  He does not have currently.  Assessment & Plan: Visit Diagnoses:  1. Bilateral foot pain   2. Acquired claw toe, unspecified laterality     Plan: Recommended Achilles stretching a minute at a time 5 times a day and this was demonstrated.  Recommended holding on surgical intervention.  If he develops any open ulcers to follow-up immediately for evaluation.  Discussed that if he would require surgical intervention he would require a Weil osteotomy of the second third and fourth metatarsals with PIP resection of the second and third toes.  Discussed that if he is unable to stretch out his Achilles he will also require a gastrocnemius recession.  Follow-Up Instructions: Return if symptoms worsen or fail to improve.   Ortho Exam  Patient is alert, oriented, no adenopathy, well-dressed, normal affect, normal respiratory effort. Examination patient has fixed clawing of his toes worse on the right than the left.  Patient states he is currently on aspirin for a previous stroke.  He is status post bilateral total hip arthroplasties status post knee arthroscopy.  Patient states that his stroke involve the left upper extremity with equinus contracture developed secondary to the stroke.  Patient has a good dorsalis pedis and  posterior tibial pulse bilaterally there is equinus contracture worse on the left with dorsiflexion 20 degrees short of neutral with his knee extended.  He has fixed clawing of the second and third toes bilaterally worse on the right.  There are no open ulcers no sausage digit swelling no signs of infection.  Imaging: No results found. No images are attached to the encounter.  Labs: Lab Results  Component Value Date   HGBA1C 5.6 12/24/2020   HGBA1C 5.0 01/25/2020   HGBA1C 5.3 10/09/2019     Lab Results  Component Value Date   ALBUMIN 4.7 12/24/2020   ALBUMIN 2.7 (L) 01/29/2020   ALBUMIN 4.2 01/24/2020    Lab Results  Component Value Date   MG 1.8 01/28/2020   Lab Results  Component Value Date   VD25OH 73.03 12/24/2020   VD25OH 81.18 10/09/2019    No results found for: PREALBUMIN CBC EXTENDED Latest Ref Rng & Units 12/24/2020 05/30/2020 02/11/2020  WBC 4.0 - 10.5 K/uL 6.5 8.1 6.0  RBC 4.22 - 5.81 Mil/uL 4.87 4.07(L) 3.45(L)  HGB 13.0 - 17.0 g/dL 14.6 12.0(L) 10.7(L)  HCT 39.0 - 52.0 % 44.0 36.6(L) 32.3(L)  PLT 150.0 - 400.0 K/uL 260.0 230 471(H)  NEUTROABS 1.4 - 7.7 K/uL 4.7 6.5 3.9  LYMPHSABS 0.7 - 4.0 K/uL 1.1 0.9 1.1     There is  no height or weight on file to calculate BMI.  Orders:  Orders Placed This Encounter  Procedures  . XR Foot 2 Views Right  . XR Foot 2 Views Left   No orders of the defined types were placed in this encounter.    Procedures: No procedures performed  Clinical Data: No additional findings.  ROS:  All other systems negative, except as noted in the HPI. Review of Systems  Objective: Vital Signs: There were no vitals taken for this visit.  Specialty Comments:  No specialty comments available.  PMFS History: Patient Active Problem List   Diagnosis Date Noted  . Left hemiparesis ([place]) 03/28/2020  . Chronic bilateral low back pain without sciatica 02/25/2020  . Spasticity as late effect of cerebrovascular accident (CVA)  02/25/2020  . Cognitive and neurobehavioral dysfunction   . Hypertensive urgency 01/28/2020  . Urinary retention 01/28/2020  . Right middle cerebral artery stroke ([place]) 01/28/2020  . Stroke (cerebrum) ([place]) -  [name] 01/25/2020  . Acute respiratory failure with hypoxia ([place])   . Hypotension   . Hypokalemia   . Hypocalcemia   . Encephalopathy acute   . Stroke ([place]) 01/24/2020  . Middle cerebral artery embolism, right 01/24/2020  . Hyperlipidemia 02/21/2019  . Impingement syndrome of right shoulder region 02/21/2018  . OA (osteoarthritis) of hip 01/18/2018  . Prostate cancer ([place]) 03/04/2016  . Essential hypertension 11/27/2014  . History of colonic polyps 09/26/2014  . BACK PAIN, LEFT 12/22/2009  . PROSTATE SPECIFIC ANTIGEN, ELEVATED 12/20/2008  . LATERAL EPICONDYLITIS 07/15/2008  . ACTINIC KERATOSIS, FOREHEAD, LEFT 05/23/2008  . WART, LEFT HAND 07/19/2007  . ERECTILE DYSFUNCTION, MILD 07/19/2007  . HERNIA, BILATERAL INGUINAL W/O OBST/GANGRENE 07/19/2007  . MENISCUS TEAR 07/19/2007  . Bilateral inguinal hernia 07/19/2007   Past Medical History:  Diagnosis Date  . Elevated PSA   . Hyperlipidemia    pt says not anymore  . Hypertension   . Prostate cancer ([place]) 2013    Family History  Problem Relation Age of Onset  . Pancreatic cancer Mother   . Heart disease Father   . Colon cancer Neg Hx     Past Surgical History:  Procedure Laterality Date  . bilateral inguinal hernia  2008  . BUBBLE STUDY  01/28/2020   Procedure: BUBBLE STUDY;  Surgeon: [name], MD;  Location: [place];  Service: Cardiovascular;;  . CYST REMOVAL TRUNK  2016   sebaceous cyst  . CYSTOSCOPY  04/22/2016   Procedure: CYSTOSCOPY;  Surgeon: [name], MD;  Location: [facility];  Service: Urology;;  . IR ANGIO INTRA EXTRACRAN SEL COM CAROTID INNOMINATE BILAT MOD  SED  05/30/2020  . IR ANGIO VERTEBRAL SEL SUBCLAVIAN INNOMINATE UNI R MOD SED  01/25/2020  . IR ANGIO VERTEBRAL SEL VERTEBRAL BILAT MOD SED  05/30/2020  . IR CT HEAD LTD  01/25/2020  . IR PERCUTANEOUS ART THROMBECTOMY/INFUSION INTRACRANIAL INC DIAG ANGIO  01/25/2020  . IR US GUIDE VASC ACCESS RIGHT  05/30/2020  . KNEE ARTHROSCOPY  2007   right  . LOOP RECORDER INSERTION N/A 01/28/2020   Procedure: LOOP RECORDER INSERTION;  Surgeon: [name], MD;  Location: [place] [facility];  Service: Cardiovascular;  Laterality: N/A;  . PROSTATE BIOPSY  2008  . PROSTATE BIOPSY  2013  . PROSTATE BIOPSY  01/28/2016  . RADIOACTIVE SEED IMPLANT N/A 04/22/2016   Procedure:  RADIOACTIVE SEED IMPLANT/BRACHYTHERAPY IMPLANT;  Surgeon: [name], MD;  Location: [facility];  Service: Urology;  Laterality: N/A;  . RADIOLOGY WITH ANESTHESIA N/A 01/24/2020   Procedure: IR WITH ANESTHESIA;  Surgeon: [name], MD;  Location: [place];  Service: Radiology;  Laterality: N/A;  . TEE WITHOUT CARDIOVERSION N/A 01/28/2020   Procedure: TRANSESOPHAGEAL ECHOCARDIOGRAM (TEE);  Surgeon: [name], MD;  Location: [facility]COPY;  Service: Cardiovascular;  Laterality: N/A;  . TONSILLECTOMY    . TOTAL HIP ARTHROPLASTY  2001   left  . TOTAL HIP ARTHROPLASTY Right 01/18/2018   Procedure: RIGHT TOTAL HIP ARTHROPLASTY ANTERIOR APPROACH;  Surgeon: [name], MD;  Location: [facility];  Service: Orthopedics;  Laterality: Right;   Social History   Occupational History  . Not on file  Tobacco Use  . Smoking status: Former Smoker    Packs/day: 1.00    Years: 32.00    Pack years: 32.00    Types: Cigarettes    Quit date: 06/06/1999    Years since quitting: 21.7  . Smokeless tobacco: Never Used  Vaping Use  . Vaping Use: Never used  Substance and Sexual Activity  . Alcohol use: Yes    Alcohol/week: 14.0 standard drinks    Types: 14 Shots of liquor per week  . Drug use: No  . Sexual activity: Yes

## 2021-02-13 ENCOUNTER — Encounter: Payer: PPO | Attending: Physical Medicine and Rehabilitation | Admitting: Physical Medicine and Rehabilitation

## 2021-02-13 ENCOUNTER — Other Ambulatory Visit: Payer: Self-pay

## 2021-02-13 ENCOUNTER — Encounter: Payer: Self-pay | Admitting: Physical Medicine and Rehabilitation

## 2021-02-13 VITALS — BP 118/72 | HR 70 | Temp 98.7°F | Ht 68.0 in | Wt 169.6 lb

## 2021-02-13 DIAGNOSIS — G8194 Hemiplegia, unspecified affecting left nondominant side: Secondary | ICD-10-CM | POA: Diagnosis not present

## 2021-02-13 DIAGNOSIS — I69398 Other sequelae of cerebral infarction: Secondary | ICD-10-CM | POA: Insufficient documentation

## 2021-02-13 DIAGNOSIS — R252 Cramp and spasm: Secondary | ICD-10-CM | POA: Insufficient documentation

## 2021-02-13 NOTE — Progress Notes (Signed)
Subjective:    Patient ID: Ruben Chavez, male    DOB: 1951-06-28, 70 y.o.   MRN: [id number]  HPI   Patient is a 70 yr old male with R MCA CVA s/p loop recorder with hx of prostate CA s/p 3 yr ago; HTN, HLD, low back pain and LUE edema here for f/u. Stroke was 2/21.   Got EMG guided Dysport 200 units on 01/27/21.  L hand- Pronator teres, FDS, FDP  And FCR  Hasn't felt any change at all-   Has done some research and knew should feel something by now.  Hasn't felt any improvement in tightness/tone.     Pain Inventory Average Pain 4 Pain Right Now 0 My pain is intermittent and sharp  In the last 24 hours, has pain interfered with the following? General activity 0 Relation with others 0 Enjoyment of life 0 What TIME of day is your pain at its worst? daytime Sleep (in general) Fair  Pain is worse with: some activites Pain improves with: na Relief from Meds: na  Family History  Problem Relation Age of Onset  . Pancreatic cancer Mother   . Heart disease Father   . Colon cancer Neg Hx    Social History   Socioeconomic History  . Marital status: Married    Spouse name: Not on file  . Number of children: Not on file  . Years of education: Not on file  . Highest education level: Not on file  Occupational History  . Not on file  Tobacco Use  . Smoking status: Former Smoker    Packs/day: 1.00    Years: 32.00    Pack years: 32.00    Types: Cigarettes    Quit date: 06/06/1999    Years since quitting: 21.7  . Smokeless tobacco: Never Used  Vaping Use  . Vaping Use: Never used  Substance and Sexual Activity  . Alcohol use: Yes    Alcohol/week: 14.0 standard drinks    Types: 14 Shots of liquor per week  . Drug use: No  . Sexual activity: Yes  Other Topics Concern  . Not on file  Social History Narrative  . Not on file   Social Determinants of Health   Financial Resource Strain: Not on file  Food Insecurity: Not on file  Transportation Needs: Not on file   Physical Activity: Not on file  Stress: Not on file  Social Connections: Not on file   Past Surgical History:  Procedure Laterality Date  . bilateral inguinal hernia  2008  . BUBBLE STUDY  01/28/2020   Procedure: BUBBLE STUDY;  Surgeon: [name], MD;  Location: [place];  Service: Cardiovascular;;  . CYST REMOVAL TRUNK  2016   sebaceous cyst  . CYSTOSCOPY  04/22/2016   Procedure: CYSTOSCOPY;  Surgeon: [name], MD;  Location: [facility];  Service: Urology;;  . IR ANGIO INTRA EXTRACRAN SEL COM CAROTID INNOMINATE BILAT MOD SED  05/30/2020  . IR ANGIO VERTEBRAL SEL SUBCLAVIAN INNOMINATE UNI R MOD SED  01/25/2020  . IR ANGIO VERTEBRAL SEL VERTEBRAL BILAT MOD SED  05/30/2020  . IR CT HEAD LTD  01/25/2020  . IR PERCUTANEOUS ART THROMBECTOMY/INFUSION INTRACRANIAL INC DIAG ANGIO  01/25/2020  . IR US GUIDE VASC ACCESS RIGHT  05/30/2020  . KNEE ARTHROSCOPY  2007   right  . LOOP RECORDER INSERTION N/A 01/28/2020   Procedure: LOOP RECORDER INSERTION;  Surgeon: [name], MD;  Location: [place] [facility];  Service: Cardiovascular;  Laterality: N/A;  . PROSTATE BIOPSY  2008  . PROSTATE BIOPSY  2013  . PROSTATE BIOPSY  01/28/2016  . RADIOACTIVE SEED IMPLANT N/A 04/22/2016   Procedure: RADIOACTIVE SEED IMPLANT/BRACHYTHERAPY IMPLANT;  Surgeon: [name], MD;  Location: [facility];  Service: Urology;  Laterality: N/A;  . RADIOLOGY WITH ANESTHESIA N/A 01/24/2020   Procedure: IR WITH ANESTHESIA;  Surgeon: [name], MD;  Location: [place];  Service: Radiology;  Laterality: N/A;  . TEE WITHOUT CARDIOVERSION N/A 01/28/2020   Procedure: TRANSESOPHAGEAL ECHOCARDIOGRAM (TEE);  Surgeon: [name], MD;  Location: [facility];  Service: Cardiovascular;  Laterality: N/A;  . TONSILLECTOMY    . TOTAL HIP ARTHROPLASTY  2001   left  . TOTAL HIP ARTHROPLASTY Right 01/18/2018   Procedure: RIGHT TOTAL HIP ARTHROPLASTY ANTERIOR APPROACH;  Surgeon:  [name], MD;  Location: [facility];  Service: Orthopedics;  Laterality: Right;   Past Surgical History:  Procedure Laterality Date  . bilateral inguinal hernia  2008  . BUBBLE STUDY  01/28/2020   Procedure: BUBBLE STUDY;  Surgeon: [name], MD;  Location: [place];  Service: Cardiovascular;;  . CYST REMOVAL TRUNK  2016   sebaceous cyst  . CYSTOSCOPY  04/22/2016   Procedure: CYSTOSCOPY;  Surgeon: [name], MD;  Location: [facility];  Service: Urology;;  . IR ANGIO INTRA EXTRACRAN SEL COM CAROTID INNOMINATE BILAT MOD SED  05/30/2020  . IR ANGIO VERTEBRAL SEL SUBCLAVIAN INNOMINATE UNI R MOD SED  01/25/2020  . IR ANGIO VERTEBRAL SEL VERTEBRAL BILAT MOD SED  05/30/2020  . IR CT HEAD LTD  01/25/2020  . IR PERCUTANEOUS ART THROMBECTOMY/INFUSION INTRACRANIAL INC DIAG ANGIO  01/25/2020  . IR US GUIDE VASC ACCESS RIGHT  05/30/2020  . KNEE ARTHROSCOPY  2007   right  . LOOP RECORDER INSERTION N/A 01/28/2020   Procedure: LOOP RECORDER INSERTION;  Surgeon: [name], MD;  Location: [place] [facility];  Service: Cardiovascular;  Laterality: N/A;  . PROSTATE BIOPSY  2008  . PROSTATE BIOPSY  2013  . PROSTATE BIOPSY  01/28/2016  . RADIOACTIVE SEED IMPLANT N/A 04/22/2016   Procedure: RADIOACTIVE SEED IMPLANT/BRACHYTHERAPY IMPLANT;  Surgeon: [name], MD;  Location: [facility];  Service: Urology;  Laterality: N/A;  . RADIOLOGY WITH ANESTHESIA N/A 01/24/2020   Procedure: IR WITH ANESTHESIA;  Surgeon: [name], MD;  Location: [place];  Service: Radiology;  Laterality: N/A;  . TEE WITHOUT CARDIOVERSION N/A 01/28/2020   Procedure: TRANSESOPHAGEAL ECHOCARDIOGRAM (TEE);  Surgeon: [name], MD;  Location: [facility] ENDOSCOPY;  Service: Cardiovascular;  Laterality: N/A;  . TONSILLECTOMY    . TOTAL HIP ARTHROPLASTY  2001   left  . TOTAL HIP ARTHROPLASTY Right 01/18/2018   Procedure: RIGHT TOTAL HIP ARTHROPLASTY ANTERIOR APPROACH;  Surgeon: [name], MD;  Location: [facility];  Service: Orthopedics;  Laterality: Right;   Past Medical History:  Diagnosis Date  . Elevated PSA   . Hyperlipidemia    pt says not anymore  . Hypertension   . Prostate cancer ([place]) 2013   BP 118/72   Pulse 70   Temp 98.7 F (37.1 C)   Ht 5\' 8"  (1.727 m)   Wt 169 lb 9.6 oz (76.9 kg)   SpO2 96%   BMI 25.79 kg/m   Opioid Risk Score:   Fall Risk Score:  `1  Depression screen [facility] 2/9  Depression screen [facility] 2/9 01/27/2021 12/24/2020 03/19/2020 10/09/2019 02/21/2019 08/21/2018 10/05/2016  Decreased  Interest 0 0 0 0 0 0 0  Down, Depressed, Hopeless 0 0 0 0 0 0 0  PHQ - 2 Score 0 0 0 0 0 0 0  Altered sleeping - - - 0 - - -  Tired, decreased energy - 0 - 0 - - -  Change in appetite - 0 - 0 - - -  Feeling bad or failure about yourself  - 0 - 0 - - -  Trouble concentrating - 0 - 0 - - -  Moving slowly or fidgety/restless - 0 - 0 - - -  Suicidal thoughts - 0 - 0 - - -  PHQ-9 Score - - - 0 - - -  Difficult doing work/chores - - - Not difficult at all - - -  Some recent data might be hidden     Review of Systems  Musculoskeletal:       Hand  All other systems reviewed and are negative.      Objective:   Physical Exam  MAS of 1 at max of L hand- so MUCH better s/p Dysport!Marland Kitchen  Hoffman's (+) LUE Can not completely resolve flexion contracture of L 4th digit, but other fingers can be made straight or go into slight extension.  No assistive device- awake, alert, NAD Wearing L hand splint-  MS: Grip is 2-5 and finger abd is trace/5        Assessment & Plan:    Patient is a 70 yr old male with R MCA CVA s/p loop recorder with hx of prostate CA s/p 3 yr ago; HTN, HLD, low back pain and LUE edema here for f/u. Stroke was 2/21.  1. I think the [employer] Ke[employer] Glove would be a good investment. Look on [email or web address] vs [employer], etc.   2. Therapy putty start with extra light- and go from there- to move fingers slightly- but would work best AFTER using Agricultural engineer.    3. OK to use Saebo glove since prostate CA was >3 yrs ago.   4. Don't need oral spasticity meds, because Dysport has worked so well.    5. F/U in 3 months AFTER next Dysport appointment.   - needs f/u for Dr Letta Pate. For Dysport  I spent a total of 25 minutes on visit- as detailed above.    Discussed with pt about spasticity and function and the difference between the 2- and how that relates to his recovery.

## 2021-02-13 NOTE — Patient Instructions (Signed)
  Patient is a 70 yr old male with R MCA CVA s/p loop recorder with hx of prostate CA s/p 3 yr ago; HTN, HLD, low back pain and LUE edema here for f/u. Stroke was 2/21.  1. I think the Rowe Gre[employer] Glove would be a good investment. Look on [email or web address] vs [place], etc.   2. Therapy putty start with extra light- and go from there- to move fingers slightly- but would work best AFTER using Agricultural engineer.   3. OK to use Saebo glove since prostate CA was >3 yrs ago.   4. Don't need oral spasticity meds, because Dysport has worked so well.    5. F/U in 3 months AFTER next Dysport appointment.   - needs f/u for Dr Letta Pate. For Dysport

## 2021-02-17 NOTE — Progress Notes (Signed)
Carelink Summary Report / Loop Recorder 

## 2021-02-28 ENCOUNTER — Other Ambulatory Visit: Payer: Self-pay | Admitting: Internal Medicine

## 2021-02-28 DIAGNOSIS — I69398 Other sequelae of cerebral infarction: Secondary | ICD-10-CM

## 2021-02-28 DIAGNOSIS — R252 Cramp and spasm: Secondary | ICD-10-CM

## 2021-03-05 ENCOUNTER — Telehealth: Payer: Self-pay | Admitting: Internal Medicine

## 2021-03-05 MED ORDER — TRIAMCINOLONE ACETONIDE 0.1 % EX CREA
1.0000 | TOPICAL_CREAM | Freq: Two times a day (BID) | CUTANEOUS | 0 refills | Status: DC
Start: 2021-03-05 — End: 2021-06-26

## 2021-03-05 NOTE — Telephone Encounter (Signed)
Patient is calling and requesting a new prescription for Triamcinolone 30G for skin rash between his 2 toes. Per patient he has been taking his wife medication. Offered pt an appointment but declined.  [facility] [place], [address]  [place], [place] [address]  Phone:  [phone] Fax:  [phone] CB is [phone]

## 2021-03-05 NOTE — Telephone Encounter (Signed)
Refill sent.

## 2021-03-14 LAB — CUP PACEART REMOTE DEVICE CHECK
Date Time Interrogation Session: 20220402230714
Implantable Pulse Generator Implant Date: 20210222

## 2021-03-16 ENCOUNTER — Ambulatory Visit (INDEPENDENT_AMBULATORY_CARE_PROVIDER_SITE_OTHER): Payer: PPO

## 2021-03-16 DIAGNOSIS — I63519 Cerebral infarction due to unspecified occlusion or stenosis of unspecified middle cerebral artery: Secondary | ICD-10-CM

## 2021-03-23 ENCOUNTER — Telehealth: Payer: Self-pay | Admitting: Internal Medicine

## 2021-03-23 NOTE — Telephone Encounter (Signed)
Pt is having surgery, and he was advised to stop his aspirin 25 mg tabs one week before surgery. pt would like to know if that will be ok

## 2021-03-24 NOTE — Telephone Encounter (Signed)
Patient is aware 

## 2021-03-27 NOTE — Progress Notes (Signed)
Carelink Summary Report / Loop Recorder 

## 2021-04-16 ENCOUNTER — Other Ambulatory Visit: Payer: Self-pay | Admitting: General Surgery

## 2021-04-16 DIAGNOSIS — D170 Benign lipomatous neoplasm of skin and subcutaneous tissue of head, face and neck: Secondary | ICD-10-CM | POA: Diagnosis not present

## 2021-04-16 DIAGNOSIS — M7989 Other specified soft tissue disorders: Secondary | ICD-10-CM | POA: Diagnosis not present

## 2021-04-16 DIAGNOSIS — D171 Benign lipomatous neoplasm of skin and subcutaneous tissue of trunk: Secondary | ICD-10-CM | POA: Diagnosis not present

## 2021-04-20 ENCOUNTER — Ambulatory Visit (INDEPENDENT_AMBULATORY_CARE_PROVIDER_SITE_OTHER): Payer: PPO

## 2021-04-20 DIAGNOSIS — I63519 Cerebral infarction due to unspecified occlusion or stenosis of unspecified middle cerebral artery: Secondary | ICD-10-CM

## 2021-04-21 ENCOUNTER — Telehealth: Payer: Self-pay | Admitting: Internal Medicine

## 2021-04-21 LAB — CUP PACEART REMOTE DEVICE CHECK
Date Time Interrogation Session: 20220514230617
Implantable Pulse Generator Implant Date: 20210222

## 2021-04-21 IMAGING — CT CT ANGIO NECK
2 of 11 series · 7 of 33 positions shown · IV contrast (omnipaque)
Comparison: CTA head neck 01/24/2020

CLINICAL DATA: History of stroke.

EXAM:
CT ANGIOGRAPHY HEAD AND NECK
TECHNIQUE: Multidetector CT imaging of the head and neck was performed using
the standard protocol during bolus administration of intravenous
contrast. Multiplanar CT image reconstructions and MIPs were
obtained to evaluate the vascular anatomy. Carotid stenosis
measurements (when applicable) are obtained utilizing NASCET
criteria, using the distal internal carotid diameter as the
denominator.
CONTRAST:  100mL OMNIPAQUE IOHEXOL 350 MG/ML SOLN

[Series 12: cta neck · axial · 0.52mm/px · z∈[-204,-78]mm · 2 of 191 slices shown]
[im 64/191  soft-tissue]
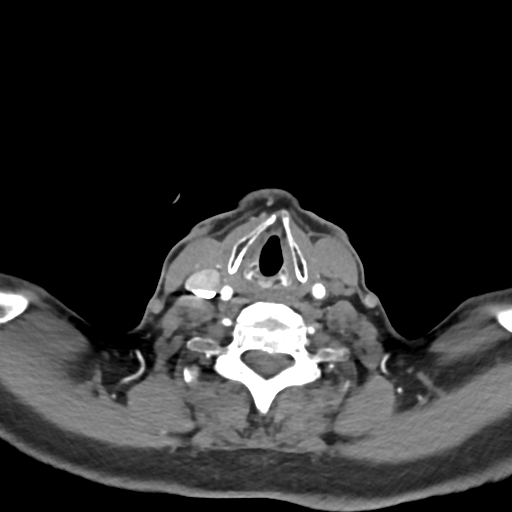
[im 127/191  bone]
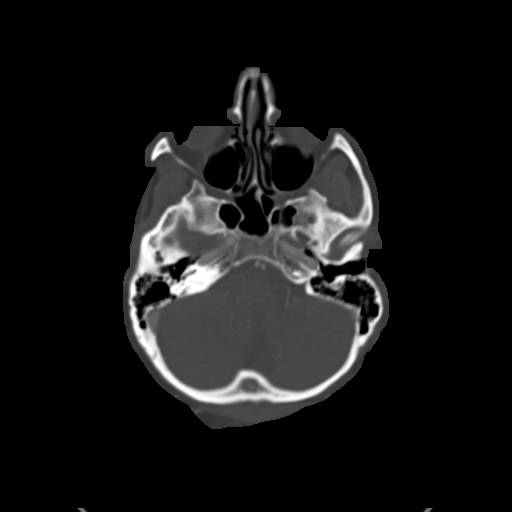

[Series 14: cta neck axial · axial · 0.39mm/px · z∈[-269,-17]mm · 5 of 380 slices shown]
[im 64/380  soft-tissue]
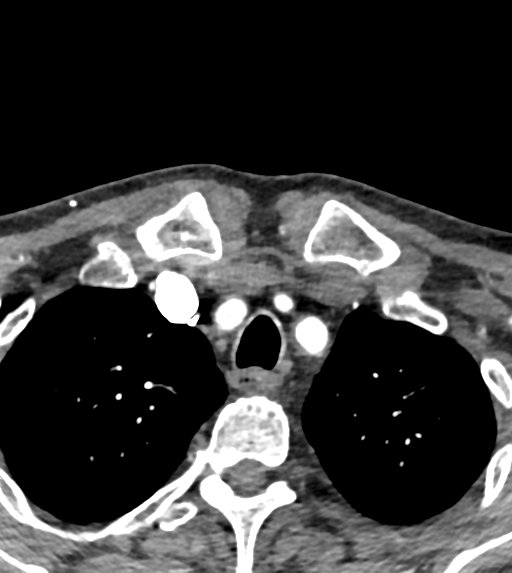
[im 127/380  soft-tissue]
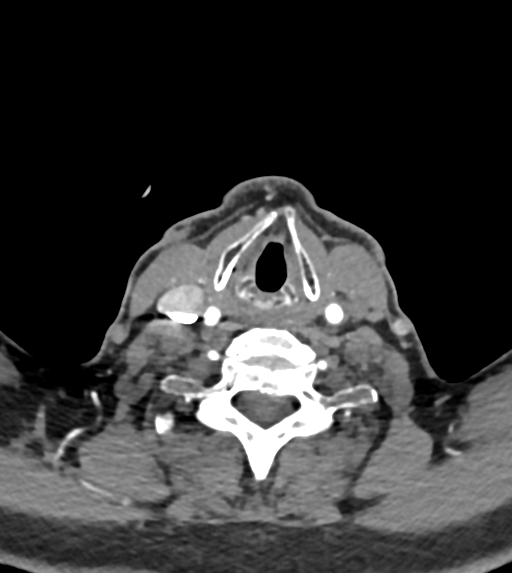
[im 190/380  soft-tissue]
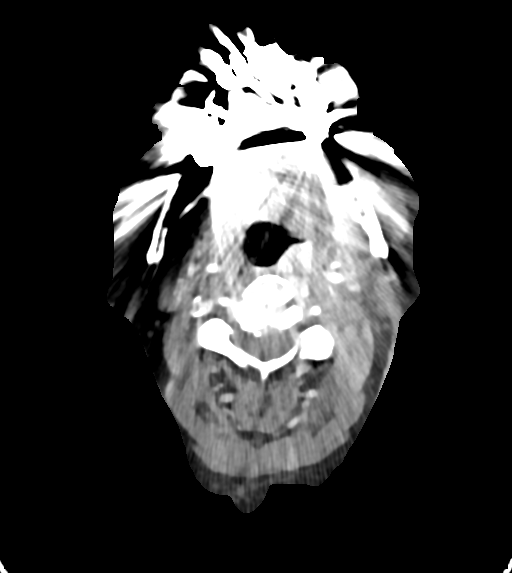
[im 253/380  soft-tissue]
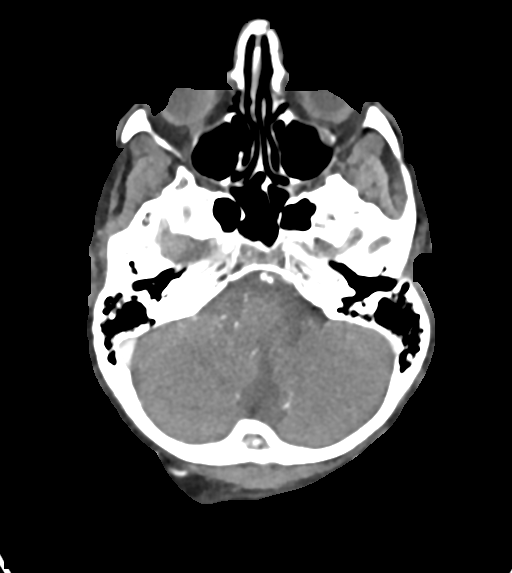
[im 316/380  soft-tissue]
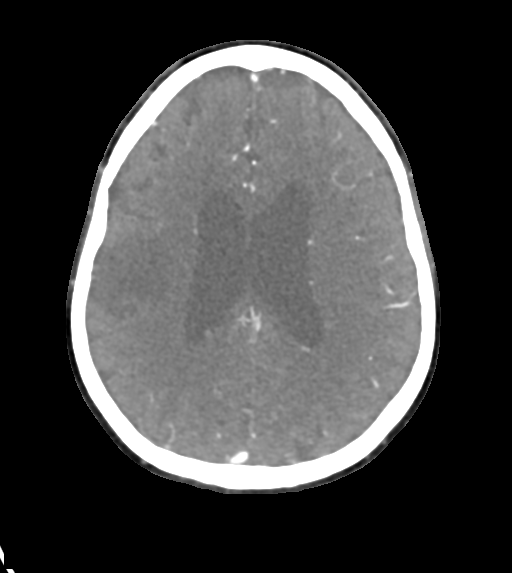

[7 of 33 positions shown; findings below may reference images not displayed]

FINDINGS: CT HEAD FINDINGS

Brain: Chronic infarct right MCA territory. Infarct involves the
right anterior and middle frontal lobe as well as the lateral
temporal lobe. The insula also is involved. Infarct territory
similar to the diffusion abnormality on MRI from 01/25/2020

There is mild atrophy.  No acute infarct, hemorrhage, mass.

Vascular: Negative for hyperdense vessel

Skull: Negative

Sinuses: Paranasal sinuses clear.

Orbits: Negative

Review of the MIP images confirms the above findings

CTA NECK FINDINGS

Aortic arch: Standard branching. Imaged portion shows no evidence of
aneurysm or dissection. No significant stenosis of the major arch
vessel origins.

Right carotid system: Mild atherosclerotic disease right carotid
bifurcation without significant stenosis.

Left carotid system: Moderate atherosclerotic calcification left
carotid bifurcation. Less than 25% diameter stenosis proximal left
internal carotid artery.

Vertebral arteries: Both vertebral arteries are patent to the
basilar. Mild atherosclerotic plaque right vertebral artery origin
causing moderate to severe stenosis best seen on the sagittal
images. Left vertebral artery origin is widely patent.

Skeleton: Cervical spondylosis.  No acute skeletal lesion.

Other neck: Negative for mass or adenopathy in the neck.

Upper chest: Lung apices clear bilaterally. Atherosclerotic aortic
arch.

Review of the MIP images confirms the above findings

CTA HEAD FINDINGS

Anterior circulation: Mild atherosclerotic calcification in the
cavernous carotid bilaterally without stenosis. Anterior cerebral
arteries patent bilaterally without stenosis. Left middle cerebral
artery widely patent

Occlusion distal right M1 segment. There is a small early branch of
the right middle cerebral artery which appears to extend superiorly
and anteriorly. The inferior division of the right MCA appears
occluded with overall decreased perfusion in the right MCA territory
including the superior and inferior MCA territories.

Posterior circulation: Both vertebral arteries patent to the basilar
with mild atherosclerotic disease distally. PICA patent bilaterally.
Basilar widely patent. Superior cerebellar and posterior cerebral
arteries patent bilaterally without stenosis.

Venous sinuses: Normal venous enhancement.

Anatomic variants: None

Review of the MIP images confirms the above findings
IMPRESSION: 1. Chronic right MCA infarct similar to the prior MRI. No acute
abnormality.
2. Mild atherosclerotic disease in the carotid bifurcation
bilaterally. Less than 25% diameter stenosis proximal left internal
carotid artery. Right internal carotid artery without significant
stenosis.
3. Moderate to severe stenosis origin of right vertebral artery
unchanged. Left vertebral artery patent. Mild atherosclerotic
disease distal vertebral artery bilaterally.
4. Interval occlusion of distal right M1 segment with poor perfusion
in the superior and inferior division of the right MCA territory.
There is a small early branch of the right MCA which appears to
extend superiorly.

## 2021-04-21 NOTE — Telephone Encounter (Signed)
Left message on machine for patient returning his call.  More information needed. 

## 2021-04-21 NOTE — Telephone Encounter (Signed)
Pt would like to have a call back to discuss him getting the third COVID booster shot.

## 2021-04-22 ENCOUNTER — Telehealth: Payer: Self-pay | Admitting: Internal Medicine

## 2021-04-22 NOTE — Telephone Encounter (Signed)
Left message on machine for patient returning our call 

## 2021-04-22 NOTE — Telephone Encounter (Signed)
Pt call and stated he call yesterday and dr.Hernandez call him back but he missed her call and want a call back today.

## 2021-04-22 NOTE — Telephone Encounter (Signed)
Spoke with patient.

## 2021-04-22 NOTE — Telephone Encounter (Signed)
Patient is calling with a concern if he should get his second Covid booster vaccine.  Patient went to his dentist 2 days ago and there was a sign that if you have had a Covid vaccine to let the dentist know.  Patient was informed that " the vaccine can cause proteins to shed and can cause blood clots".  Patient is concerned because of his history of stroke.  Okay to leave a message.    Per Dr Jerilee Hoh.  Okay for patient to get his booster shot.

## 2021-04-22 NOTE — Telephone Encounter (Signed)
Left message on machine for patient to get is vaccination.

## 2021-04-28 ENCOUNTER — Ambulatory Visit: Payer: PPO | Admitting: Physical Medicine & Rehabilitation

## 2021-04-30 ENCOUNTER — Ambulatory Visit: Payer: PPO | Admitting: Physical Medicine & Rehabilitation

## 2021-05-05 ENCOUNTER — Encounter: Payer: PPO | Attending: Physical Medicine and Rehabilitation | Admitting: Physical Medicine & Rehabilitation

## 2021-05-05 ENCOUNTER — Encounter: Payer: Self-pay | Admitting: Physical Medicine & Rehabilitation

## 2021-05-05 ENCOUNTER — Other Ambulatory Visit: Payer: Self-pay

## 2021-05-05 VITALS — BP 131/78 | HR 66 | Temp 98.4°F | Ht 68.0 in | Wt 168.4 lb

## 2021-05-05 DIAGNOSIS — I69398 Other sequelae of cerebral infarction: Secondary | ICD-10-CM | POA: Insufficient documentation

## 2021-05-05 DIAGNOSIS — R252 Cramp and spasm: Secondary | ICD-10-CM | POA: Diagnosis not present

## 2021-05-05 NOTE — Progress Notes (Signed)
Dysport Injection for spasticity using needle EMG guidance  Dilution: 200 Units/ml Indication: Severe spasticity which interferes with ADL,mobility and/or  hygiene and is unresponsive to medication management and other conservative care Informed consent was obtained after describing risks and benefits of the procedure with the patient. This includes bleeding, bruising, infection, excessive weakness, or medication side effects. A REMS form is on file and signed. Needle:  needle electrode Number of units per muscle Left  Pronator teres 200U FDS 0U FDP 100U FCR 200U All injections were done after obtaining appropriate EMG activity and after negative drawback for blood. The patient tolerated the procedure well. Post procedure instructions were given. A followup appointment was made.   Would rec 300 U next injection in 82months  FCR 200U PT 100U

## 2021-05-05 NOTE — Patient Instructions (Signed)

## 2021-05-06 ENCOUNTER — Other Ambulatory Visit: Payer: Self-pay | Admitting: Internal Medicine

## 2021-05-06 DIAGNOSIS — I10 Essential (primary) hypertension: Secondary | ICD-10-CM

## 2021-05-07 ENCOUNTER — Other Ambulatory Visit: Payer: Self-pay | Admitting: Internal Medicine

## 2021-05-11 ENCOUNTER — Encounter: Payer: PPO | Attending: Physical Medicine and Rehabilitation | Admitting: Physical Medicine and Rehabilitation

## 2021-05-11 ENCOUNTER — Other Ambulatory Visit: Payer: Self-pay

## 2021-05-11 ENCOUNTER — Encounter: Payer: Self-pay | Admitting: Physical Medicine and Rehabilitation

## 2021-05-11 VITALS — BP 127/74 | HR 74 | Temp 98.4°F | Ht 68.0 in | Wt 166.2 lb

## 2021-05-11 DIAGNOSIS — R252 Cramp and spasm: Secondary | ICD-10-CM | POA: Diagnosis not present

## 2021-05-11 DIAGNOSIS — I69398 Other sequelae of cerebral infarction: Secondary | ICD-10-CM | POA: Insufficient documentation

## 2021-05-11 DIAGNOSIS — G8194 Hemiplegia, unspecified affecting left nondominant side: Secondary | ICD-10-CM | POA: Diagnosis not present

## 2021-05-11 NOTE — Patient Instructions (Signed)
  Patient is a 70 yr old male with R MCA CVA s/p loop recorder with hx of prostate CA s/p 3 yr ago; HTN, HLD, low back pain and LUE edema here for f/u.Stroke was 2/21. Here for f/u on Stroke.  S/p Botox on LUE 5/31- as above by Dr Letta Pate.   1. Not on any meds for spasticity- con't therapy/ROM regimen.  2. S/P Botox- didn't do FPB or FDS due to Saebo splint usage.   3. Suggest using therapy balls or putty 15 minutes 2x/day. Don't do more.  Can increase frequency- 3x/day, but don't increase time.   4.  F/u in 3 months.

## 2021-05-11 NOTE — Progress Notes (Signed)
Subjective:    Patient ID: Ruben Chavez, male    DOB: Jan 27, 1951, 70 y.o.   MRN: [id number]  HPI  Patient is a 70 yr old male with R MCA CVA s/p loop recorder with hx of prostate CA s/p 3 yr ago; HTN, HLD, low back pain and LUE edema here for f/u.Stroke was 2/21. Here for f/u on Stroke.    Left upper extremity- 05/05/21 Botox   Pronator teres 200U FDS 0U FDP 100U FCR 200U   Was using Saebo glove-  Sent it back to them.  Called sales rep- the one he had was rubber bands only; and new one supposedly has estim as well rubber bands   Met someone who discussed therapy putty and therapy balls.  Used putty x 6 weeks then therapy balls, and back to putty in past 2 days.  Hasn't noticed any major improvement so far.   Had a lipoma removed from low back and back of neck.  3 weeks ago.  Dr Rosendo Gros did the surgery.    Pain Inventory Average Pain 0 Pain Right Now 0 My pain is intermittent, sharp and and only if he puts weight on his left wrist  In the last 24 hours, has pain interfered with the following? General activity 0 Relation with others 0 Enjoyment of life 0 What TIME of day is your pain at its worst? daytime Sleep (in general) Fair  Pain is worse with: some activites Pain improves with: na Relief from Meds: na  Family History  Problem Relation Age of Onset  . Pancreatic cancer Mother   . Heart disease Father   . Colon cancer Neg Hx    Social History   Socioeconomic History  . Marital status: Married    Spouse name: Not on file  . Number of children: Not on file  . Years of education: Not on file  . Highest education level: Not on file  Occupational History  . Not on file  Tobacco Use  . Smoking status: Former Smoker    Packs/day: 1.00    Years: 32.00    Pack years: 32.00    Types: Cigarettes    Quit date: 06/06/1999    Years since quitting: 21.9  . Smokeless tobacco: Never Used  Vaping Use  . Vaping Use: Never used  Substance and Sexual  Activity  . Alcohol use: Yes    Alcohol/week: 14.0 standard drinks    Types: 14 Shots of liquor per week  . Drug use: No  . Sexual activity: Yes  Other Topics Concern  . Not on file  Social History Narrative  . Not on file   Social Determinants of Health   Financial Resource Strain: Not on file  Food Insecurity: Not on file  Transportation Needs: Not on file  Physical Activity: Not on file  Stress: Not on file  Social Connections: Not on file   Past Surgical History:  Procedure Laterality Date  . bilateral inguinal hernia  2008  . BUBBLE STUDY  01/28/2020   Procedure: BUBBLE STUDY;  Surgeon: [name], MD;  Location: [place];  Service: Cardiovascular;;  . CYST REMOVAL TRUNK  2016   sebaceous cyst  . CYSTOSCOPY  04/22/2016   Procedure: CYSTOSCOPY;  Surgeon: [name], MD;  Location: [name];  Service: Urology;;  . IR ANGIO INTRA EXTRACRAN SEL COM CAROTID INNOMINATE BILAT MOD SED  05/30/2020  . IR ANGIO VERTEBRAL SEL SUBCLAVIAN INNOMINATE UNI R MOD SED  01/25/2020  .  IR ANGIO VERTEBRAL SEL VERTEBRAL BILAT MOD SED  05/30/2020  . IR CT HEAD LTD  01/25/2020  . IR PERCUTANEOUS ART THROMBECTOMY/INFUSION INTRACRANIAL INC DIAG ANGIO  01/25/2020  . IR US GUIDE VASC ACCESS RIGHT  05/30/2020  . KNEE ARTHROSCOPY  2007   right  . LOOP RECORDER INSERTION N/A 01/28/2020   Procedure: LOOP RECORDER INSERTION;  Surgeon: [name], MD;  Location: [place] [facility];  Service: Cardiovascular;  Laterality: N/A;  . PROSTATE BIOPSY  2008  . PROSTATE BIOPSY  2013  . PROSTATE BIOPSY  01/28/2016  . RADIOACTIVE SEED IMPLANT N/A 04/22/2016   Procedure: RADIOACTIVE SEED IMPLANT/BRACHYTHERAPY IMPLANT;  Surgeon: [name], MD;  Location: [facility];  Service: Urology;  Laterality: N/A;  . RADIOLOGY WITH ANESTHESIA N/A 01/24/2020   Procedure: IR WITH ANESTHESIA;  Surgeon: [name], MD;  Location: [place];  Service: Radiology;  Laterality:  N/A;  . TEE WITHOUT CARDIOVERSION N/A 01/28/2020   Procedure: TRANSESOPHAGEAL ECHOCARDIOGRAM (TEE);  Surgeon: [name], MD;  Location: [facility] END[facility]COPY;  Service: Cardiovascular;  Laterality: N/A;  . TONSILLECTOMY    . TOTAL HIP ARTHROPLASTY  2001   left  . TOTAL HIP ARTHROPLASTY Right 01/18/2018   Procedure: RIGHT TOTAL HIP ARTHROPLASTY ANTERIOR APPROACH;  Surgeon: [name], MD;  Location: [facility];  Service: Orthopedics;  Laterality: Right;   Past Surgical History:  Procedure Laterality Date  . bilateral inguinal hernia  2008  . BUBBLE STUDY  01/28/2020   Procedure: BUBBLE STUDY;  Surgeon: [name], MD;  Location: [place];  Service: Cardiovascular;;  . CYST REMOVAL TRUNK  2016   sebaceous cyst  . CYSTOSCOPY  04/22/2016   Procedure: CYSTOSCOPY;  Surgeon: [name], MD;  Location: [facility];  Service: Urology;;  . IR ANGIO INTRA EXTRACRAN SEL COM CAROTID INNOMINATE BILAT MOD SED  05/30/2020  . IR ANGIO VERTEBRAL SEL SUBCLAVIAN INNOMINATE UNI R MOD SED  01/25/2020  . IR ANGIO VERTEBRAL SEL VERTEBRAL BILAT MOD SED  05/30/2020  . IR CT HEAD LTD  01/25/2020  . IR PERCUTANEOUS ART THROMBECTOMY/INFUSION INTRACRANIAL INC DIAG ANGIO  01/25/2020  . IR US GUIDE VASC ACCESS RIGHT  05/30/2020  . KNEE ARTHROSCOPY  2007   right  . LOOP RECORDER INSERTION N/A 01/28/2020   Procedure: LOOP RECORDER INSERTION;  Surgeon: [name], MD;  Location: [place] [facility];  Service: Cardiovascular;  Laterality: N/A;  . PROSTATE BIOPSY  2008  . PROSTATE BIOPSY  2013  . PROSTATE BIOPSY  01/28/2016  . RADIOACTIVE SEED IMPLANT N/A 04/22/2016   Procedure: RADIOACTIVE SEED IMPLANT/BRACHYTHERAPY IMPLANT;  Surgeon: [name], MD;  Location: [facility];  Service: Urology;  Laterality: N/A;  . RADIOLOGY WITH ANESTHESIA N/A 01/24/2020   Procedure: IR WITH ANESTHESIA;  Surgeon: [name], MD;  Location: [place];  Service: Radiology;  Laterality: N/A;  .  TEE WITHOUT CARDIOVERSION N/A 01/28/2020   Procedure: TRANSESOPHAGEAL ECHOCARDIOGRAM (TEE);  Surgeon: [name], MD;  Location: [facility] ENDOSCOPY;  Service: Cardiovascular;  Laterality: N/A;  . TONSILLECTOMY    . TOTAL HIP ARTHROPLASTY  2001   left  . TOTAL HIP ARTHROPLASTY Right 01/18/2018   Procedure: RIGHT TOTAL HIP ARTHROPLASTY ANTERIOR APPROACH;  Surgeon: [name], MD;  Location: [facility];  Service: Orthopedics;  Laterality: Right;   Past Medical History:  Diagnosis Date  . Elevated PSA   . Hyperlipidemia    pt says not anymore  . Hypertension   .  Prostate cancer ([place]) 2013   There were no vitals taken for this visit.  Opioid Risk Score:   Fall Risk Score:  `1  Depression screen PHQ 2/9  Depression screen [facility] 2/9 05/11/2021 01/27/2021 12/24/2020 03/19/2020 10/09/2019 02/21/2019 08/21/2018  Decreased Interest 0 0 0 0 0 0 0  Down, Depressed, Hopeless 0 0 0 0 0 0 0  PHQ - 2 Score 0 0 0 0 0 0 0  Altered sleeping - - - - 0 - -  Tired, decreased energy - - 0 - 0 - -  Change in appetite - - 0 - 0 - -  Feeling bad or failure about yourself  - - 0 - 0 - -  Trouble concentrating - - 0 - 0 - -  Moving slowly or fidgety/restless - - 0 - 0 - -  Suicidal thoughts - - 0 - 0 - -  PHQ-9 Score - - - - 0 - -  Difficult doing work/chores - - - - Not difficult at all - -  Some recent data might be hidden   Review of Systems  Constitutional: Negative.   HENT: Negative.   Eyes: Negative.   Respiratory: Negative.   Cardiovascular: Negative.   Gastrointestinal: Negative.   Endocrine: Negative.   Genitourinary: Negative.   Musculoskeletal:       Spasticity  Skin: Negative.   Allergic/Immunologic: Negative.   Neurological: Negative.   Hematological: Negative.   Psychiatric/Behavioral: Negative.   All other systems reviewed and are negative.      Objective:   Physical Exam  Awake, alert, appropriate, occ tangential, NAD LUE- MAS of 2-3 in L elbow; L wrist MAS of 2; fingers MAS of 0  in thumb, but MAS of 1+ in rest of fingers.   Very slight loss of Extension in L 4th digit at PIP and also in 5th digit at PIP.  4th digit of L hand more curled at rest than rest of hand Skin: a few ecchymoses on LUE-        Assessment & Plan:    Patient is a 70 yr old male with R MCA CVA s/p loop recorder with hx of prostate CA s/p 3 yr ago; HTN, HLD, low back pain and LUE edema here for f/u.Stroke was 2/21. Here for f/u on Stroke.  S/p Botox on LUE 5/31- as above by Dr Letta Pate.   1. Not on any meds for spasticity- con't therapy/ROM regimen.  2. S/P Botox- didn't do FPB or FDS due to Saebo splint usage.   3. Suggest using therapy balls or putty 15 minutes 2x/day. Don't do more.  Can increase frequency- 3x/day, but don't increase time.   4.  F/u in 3 months.    I spent a total of 20 minutes on visit- discussing therapy tools to improve spasticity.

## 2021-05-12 NOTE — Progress Notes (Signed)
Carelink Summary Report / Loop Recorder 

## 2021-05-13 ENCOUNTER — Ambulatory Visit: Payer: PPO | Admitting: Adult Health

## 2021-05-13 IMAGING — XA IR ANGIO INTRA EXTRACRAN SEL COM CAROTID INNOMINATE BILAT MOD SE
1 series · 11 of 24 positions shown · IV contrast (IODINE)
Comparison: CT angiogram of the head and neck May 08, 2020.

CLINICAL DATA: Previous history of a right middle cerebral artery
occlusion requiring thrombectomy with partial recanalization. Recent
CT angiogram of the head and neck suggestive of either reocclusion,
or of a severe stenosis involving the right middle cerebral artery
superior division.

EXAM:
BILATERAL COMMON CAROTID AND INNOMINATE ANGIOGRAPHY
TECHNIQUE: Informed written consent was obtained from the patient after a
thorough discussion of the procedural risks, benefits and
alternatives. All questions were addressed. Maximal Sterile Barrier
Technique was utilized including caps, mask, sterile gowns, sterile
gloves, sterile drape, hand hygiene and skin antiseptic. A timeout
was performed prior to the initiation of the procedure.

[Series 300: dr. (person_name) · 11 of 195 slices shown]
[im 9/195]
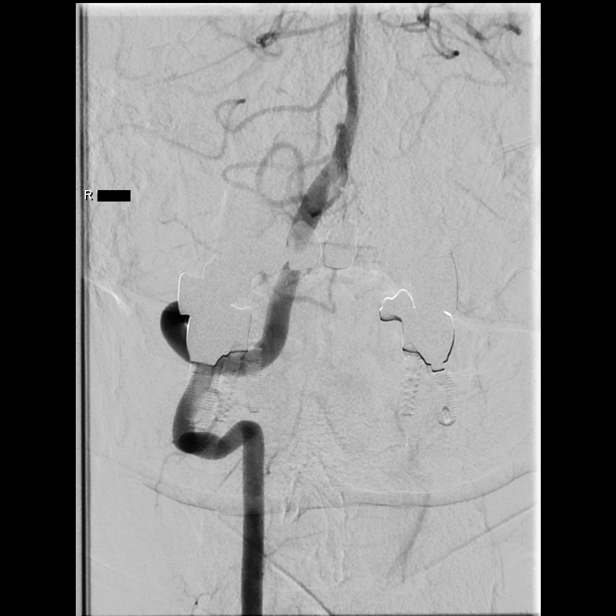
[im 26/195]
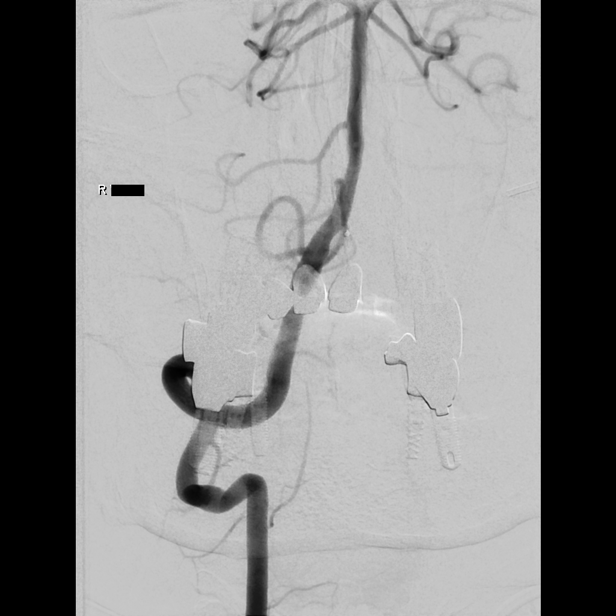
[im 43/195]
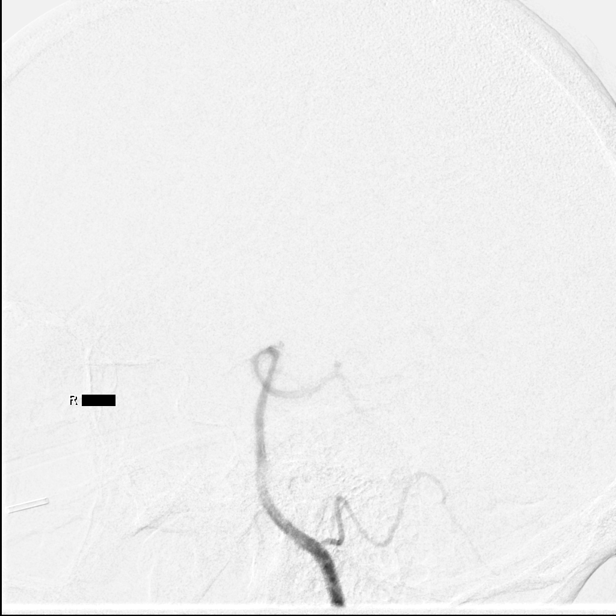
[im 60/195]
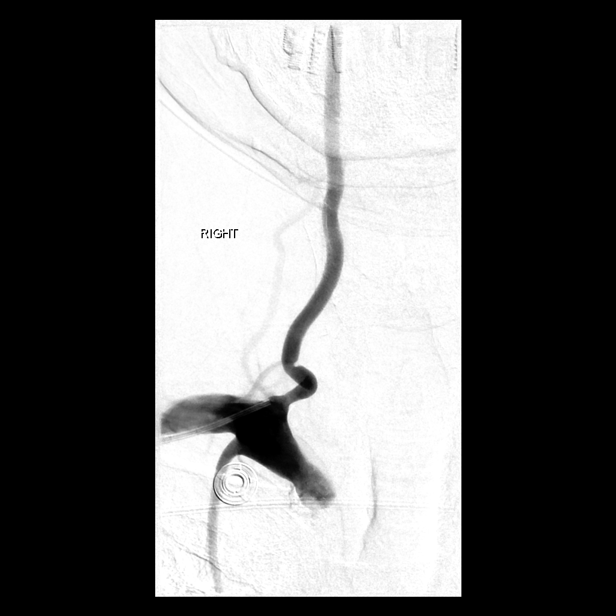
[im 76/195]
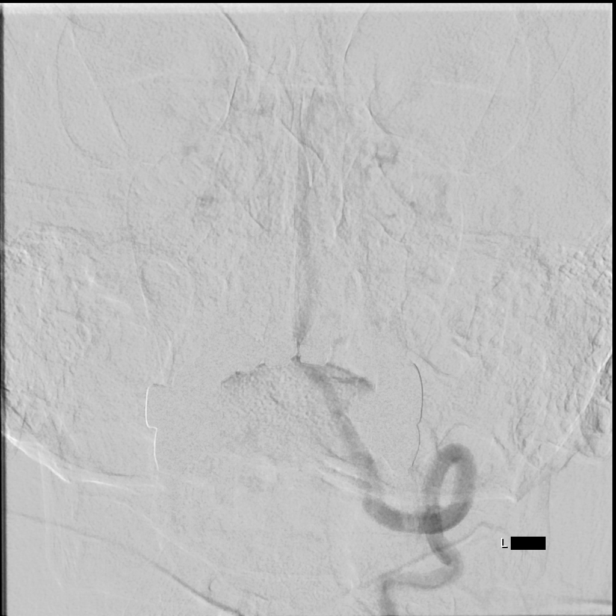
[im 102/195]
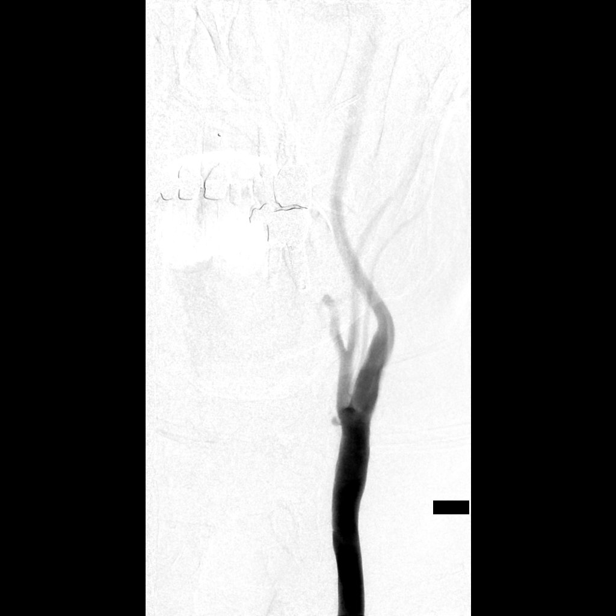
[im 119/195]
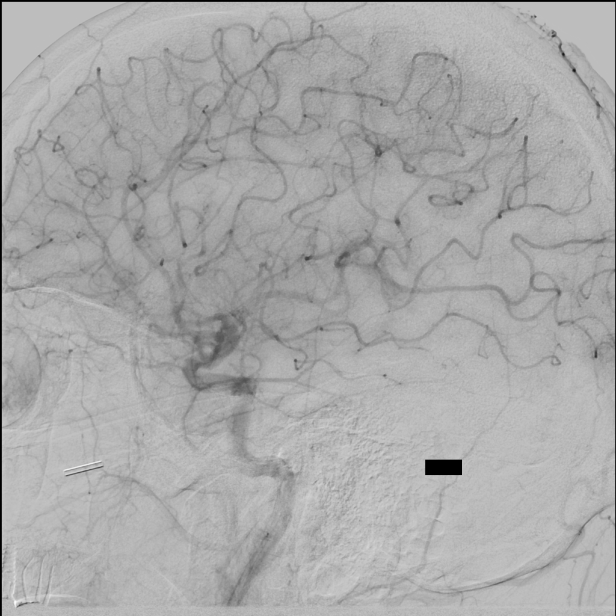
[im 135/195]
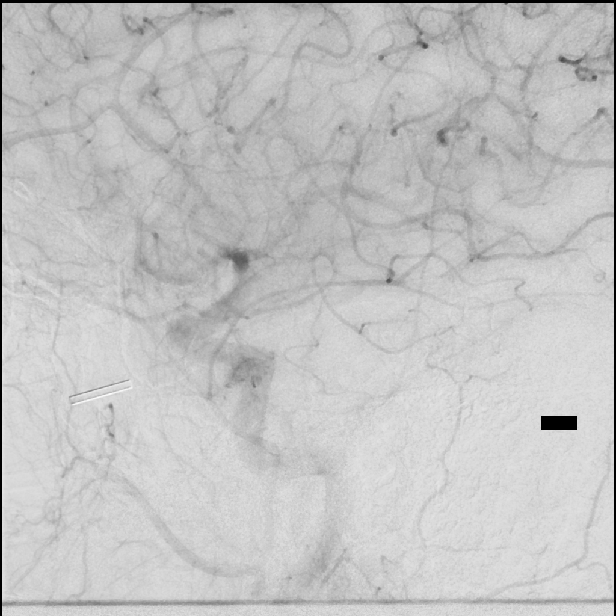
[im 152/195]
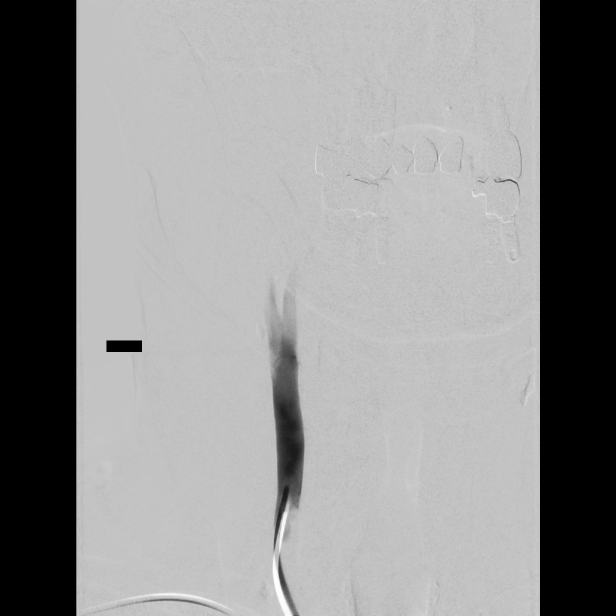
[im 169/195]
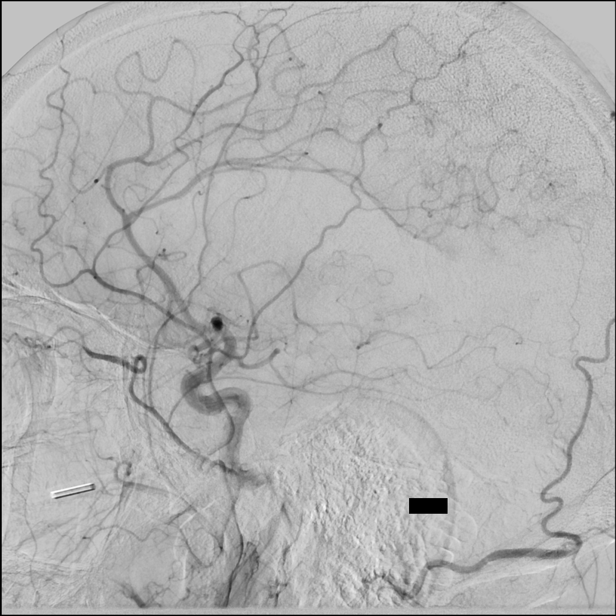
[im 186/195]
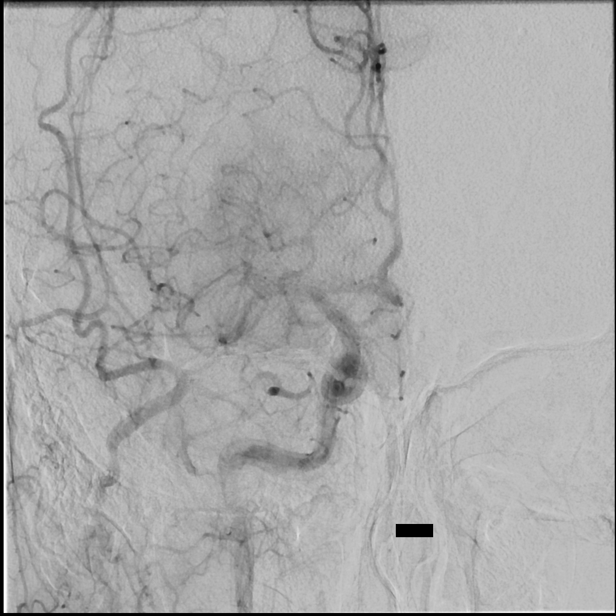

[11 of 24 positions shown; findings below may reference images not displayed]

MEDICATIONS:
Heparin 0777 units IA; no antibiotic was administered within 1 hour
of the procedure.

ANESTHESIA/SEDATION:
Versed 1 mg IV; Fentanyl 25 mcg IV

Moderate Sedation Time:  30 minutes

The patient was continuously monitored during the procedure by the
interventional radiology nurse under my direct supervision.

CONTRAST:  Isovue 300 approximately 60 mL

FLUOROSCOPY TIME:  Fluoroscopy Time: 8 minutes 30 seconds (6666
mGy).

COMPLICATIONS:
None immediate.
The right forearm to the wrist was prepped and draped in the usual
sterile manner. Ultrasound was then used to evaluate the morphology
of the right radial artery which was then documented. A dorsal
palmar anastomosis was verified to be present.

Using ultrasound guidance, and a micropuncture set, access into the
right radial artery was obtained without difficulty over a
inch micro guidewire, a [DATE] French radial sheath was then inserted
without event. The obturator, and micro guidewire were removed. Good
aspiration from the side port of the sheath was obtained. Cocktail
of 200 units of heparin, 100 mcg of nitroglycerin, and approximately
1 mg of verapamil was then infused in diluted form without event.

Over a 0.035 inch Roadrunner guidewire, a 5 Maly Crone
diagnostic catheter was advanced to the aortic arch region, and
selectively positioned in the right vertebral artery, the right
common carotid artery, the left common carotid artery and the left
vertebral artery.

Following the procedure, the right radial puncture site was sealed
with a wrist band. Distal right radial pulse was verified to be
present.
FINDINGS: .:
FINDINGS: .
The origin the right vertebral artery demonstrates approximately 30%
stenosis. More distally the vessel is seen to opacify to cranial
skull base. Wide patency is seen of the right posterior-inferior
cerebellar artery and the right vertebrobasilar junction.

The basilar artery, the posterior cerebral arteries, the superior
cerebellar arteries, and the anterior-inferior cerebellar arteries
opacify into the capillary and venous phases. Unopacified blood is
seen in the basilar artery from the contralateral vertebral artery.

Delayed arterial phase demonstrates retrograde opacification via the
right P3 leptomeningeal branches opacifying the posterior cortical
and subcortical right MCA distribution. The left vertebral artery
origin demonstrates mild stenosis at its origin. The vessel,
otherwise, opacifies to the cranial skull base.

Wide patency is seen of the left vertebrobasilar junction and the
left posterior-inferior cerebellar artery.

The opacified portion of the basilar artery, the posterior cerebral
arteries, the superior cerebellar arteries and the anterior-inferior
cerebellar arteries are widely patent. Unopacified blood is seen in
the basilar artery from the contralateral vertebral artery.

The left common carotid arteriogram demonstrates the left external
carotid artery and its major branches to be widely patent.

The left internal carotid artery at the bulb along its posterior
wall demonstrates a smooth atherosclerotic soft plaque associated
with a small ulceration more distally. The vessel is seen to opacify
to the cranial skull base. The petrous segment is widely patent.
There is approximately 50% stenosis at the petrous cavernous
junction.

Distal to this the cavernous, and the supraclinoid segments are
widely patent.

The left middle cerebral artery and the left anterior cerebral
artery opacify into the capillary and venous phases.

The right common carotid arteriogram demonstrates the right external
carotid artery and its major branches to be widely patent.

The right internal carotid artery at the bulb to the cranial skull
base is widely patent.

The petrous, cavernous and the supraclinoid segments are also
patent. A right posterior communicating artery opacifies. The right
middle cerebral artery M1 segment is widely patent.

There is a tapered occlusion of the superior division of the right
middle cerebral artery proximally.

Patency is maintained of the inferior division of the right middle
cerebral artery.

The delayed arterial phase demonstrates hyper perfused right
posterior frontal and parietal subcortical white matter extending
from the mid to the posterior corpus callosum.

The right anterior cerebral artery opacifies into the capillary and
venous phases with transient cross-filling via the anterior
communicating artery of the right anterior cerebral artery A2
segment.
IMPRESSION: Angiographic occlusion of the superior division of the right middle
cerebral artery proximally.

Retrograde collateralization of the posterior parietal cortical and
subcortical area via leptomeningeal collaterals arising from the
right P3 segment of right posterior cerebral artery.

Approximately 50% stenosis of the left petrous cavernous junction.

Ulcerated plaque along the posterior wall of the left internal
carotid artery bulb.

Approximately 30% stenosis of the right vertebral artery origin.

PLAN:
Findings were reviewed with the patient. Patient was advised to
continue with his present medical regimen. Denies use of tobacco
products.

A follow-up ultrasound of the carotids will be obtained in 6 months
time.

Patient expressed an understanding and agreement with the above
management plan.

## 2021-05-15 ENCOUNTER — Ambulatory Visit: Payer: PPO | Admitting: Physical Medicine and Rehabilitation

## 2021-05-15 ENCOUNTER — Telehealth: Payer: Self-pay | Admitting: Internal Medicine

## 2021-05-15 NOTE — Telephone Encounter (Signed)
Spoke with patient and he states "its okay to wait until Dr Jerilee Hoh is back in the office on Tuesday".  Patient would like a call back.

## 2021-05-15 NOTE — Telephone Encounter (Signed)
Patient is calling and is requesting a call back regarding some blisters on his hand. Pt declined appointment, please advise.  CB is [phone]

## 2021-05-18 ENCOUNTER — Telehealth: Payer: Self-pay | Admitting: Internal Medicine

## 2021-05-18 NOTE — Telephone Encounter (Signed)
Pt call and stated he want to talk to dr.Hernandez and he don't want to talk to Apolonio Schneiders and want dr.Hernandez to give him a call back.

## 2021-05-21 NOTE — Telephone Encounter (Signed)
Spoke with the pt and offered an appt with the PCP on tomorrow.  Patient stated he scheduled an appt for next week and would prefer to await this as he is out of town.

## 2021-05-25 ENCOUNTER — Ambulatory Visit (INDEPENDENT_AMBULATORY_CARE_PROVIDER_SITE_OTHER): Payer: PPO

## 2021-05-25 DIAGNOSIS — I63411 Cerebral infarction due to embolism of right middle cerebral artery: Secondary | ICD-10-CM

## 2021-05-26 LAB — CUP PACEART REMOTE DEVICE CHECK
Date Time Interrogation Session: 20220616230406
Implantable Pulse Generator Implant Date: 20210222

## 2021-05-27 ENCOUNTER — Other Ambulatory Visit: Payer: Self-pay

## 2021-05-28 ENCOUNTER — Encounter: Payer: Self-pay | Admitting: Internal Medicine

## 2021-05-28 ENCOUNTER — Ambulatory Visit (INDEPENDENT_AMBULATORY_CARE_PROVIDER_SITE_OTHER): Payer: PPO | Admitting: Internal Medicine

## 2021-05-28 VITALS — BP 100/58 | HR 83 | Temp 98.1°F | Wt 161.0 lb

## 2021-05-28 DIAGNOSIS — R634 Abnormal weight loss: Secondary | ICD-10-CM

## 2021-05-28 DIAGNOSIS — D692 Other nonthrombocytopenic purpura: Secondary | ICD-10-CM

## 2021-05-28 NOTE — Progress Notes (Signed)
Established Patient Office Visit     This visit occurred during the SARS-CoV-2 public health emergency.  Safety protocols were in place, including screening questions prior to the visit, additional usage of staff PPE, and extensive cleaning of exam room while observing appropriate contact time as indicated for disinfecting solutions.    CC/Reason for Visit: Discussed weight loss and blood blisters  HPI: Ruben Chavez is a 70 y.o. male who is coming in today for the above mentioned reasons.  He had a CVA in February 2021 with resultant left upper extremity paresis.  He is concerned because he has lost about 25 pounds since his stroke.  He has lost 5 pounds since his last visit in January.  He currently weighs 161 pounds.  He tells me that he just has lost his appetite.  If the food is very appetizing to him he will eat a full plate, he has a good breakfast and a good dinner but will usually skip lunch.  He has been noticing some purpura and excessive skin fragility with recently some blood blisters over his hand.  Past Medical/Surgical History: Past Medical History:  Diagnosis Date   Elevated PSA    Hyperlipidemia    pt says not anymore   Hypertension    Prostate cancer ([place]) 2013    Past Surgical History:  Procedure Laterality Date   bilateral inguinal hernia  2008   BUBBLE STUDY  01/28/2020   Procedure: BUBBLE STUDY;  Surgeon: [name], MD;  Location: [facility] ENDOSCOPY;  Service: Cardiovascular;;   CYST REMOVAL TRUNK  2016   sebaceous cyst   CYSTOSCOPY  04/22/2016   Procedure: CYSTOSCOPY;  Surgeon: [name], MD;  Location: [place];  Service: Urology;;   IR ANGIO INTRA EXTRACRAN SEL COM CAROTID INNOMINATE BILAT MOD SED  05/30/2020   IR ANGIO VERTEBRAL SEL SUBCLAVIAN INNOMINATE UNI R MOD SED  01/25/2020   IR ANGIO VERTEBRAL SEL VERTEBRAL BILAT MOD SED  05/30/2020   IR CT HEAD LTD  01/25/2020   IR PERCUTANEOUS ART THROMBECTOMY/INFUSION INTRACRANIAL  INC DIAG ANGIO  01/25/2020   IR US GUIDE VASC ACCESS RIGHT  05/30/2020   KNEE ARTHROSCOPY  2007   right   LOOP RECORDER INSERTION N/A 01/28/2020   Procedure: LOOP RECORDER INSERTION;  Surgeon: [name], MD;  Location: [place] [facility];  Service: Cardiovascular;  Laterality: N/A;   PROSTATE BIOPSY  2008   PROSTATE BIOPSY  2013   PROSTATE BIOPSY  01/28/2016   RADIOACTIVE SEED IMPLANT N/A 04/22/2016   Procedure: RADIOACTIVE SEED IMPLANT/BRACHYTHERAPY IMPLANT;  Surgeon: [name], MD;  Location: [facility];  Service: Urology;  Laterality: N/A;   RADIOLOGY WITH ANESTHESIA N/A 01/24/2020   Procedure: IR WITH ANESTHESIA;  Surgeon: [name], MD;  Location: [place];  Service: Radiology;  Laterality: N/A;   TEE WITHOUT CARDIOVERSION N/A 01/28/2020   Procedure: TRANSESOPHAGEAL ECHOCARDIOGRAM (TEE);  Surgeon: [name], MD;  Location: [facility];  Service: Cardiovascular;  Laterality: N/A;   TONSILLECTOMY     TOTAL HIP ARTHROPLASTY  2001   left   TOTAL HIP ARTHROPLASTY Right 01/18/2018   Procedure: RIGHT TOTAL HIP ARTHROPLASTY ANTERIOR APPROACH;  Surgeon: [name], MD;  Location: [facility];  Service: Orthopedics;  Laterality: Right;    Social History:  reports that he quit smoking about 21 years ago. His smoking use included cigarettes. He has a 32.00 pack-year smoking history. He has never used smokeless tobacco. He reports  current alcohol use of about 14.0 standard drinks of alcohol per week. He reports that he does not use drugs.  Allergies: Allergies  Allergen Reactions   Sulfa Antibiotics Swelling and Other (See Comments)    Swelling in ankles    Advil [Ibuprofen] Other (See Comments)    "Dots on chest" (Petechiae)   Aleve [Naproxen] Other (See Comments)    "Dots on chest" (Petechiae)   Betadine [Povidone Iodine] Itching and Rash   Chloroxylenol (Antiseptic) Itching, Rash and Other (See Comments)    PCMX surgical sterilizing scrub   Poison  Ivy Extract Rash   Povidone-Iodine Itching and Rash    BETADINE    Family History:  Family History  Problem Relation Age of Onset   Pancreatic cancer Mother    Heart disease Father    Colon cancer Neg Hx      Current Outpatient Medications:    aspirin EC 325 MG EC tablet, Take 1 tablet (325 mg total) by mouth daily., Disp: 30 tablet, Rfl: 0   atorvastatin (LIPITOR) 20 MG tablet, TAKE 1 TABLET(20 MG) BY MOUTH DAILY AT 6 PM, Disp: 90 tablet, Rfl: 1   buPROPion (WELLBUTRIN XL) 300 MG 24 hr tablet, TAKE 1 TABLET(300 MG) BY MOUTH DAILY, Disp: 90 tablet, Rfl: 1   buPROPion (WELLBUTRIN) 75 MG tablet, TAKE 1 TABLET(75 MG) BY MOUTH TWICE DAILY AT 10 AM AND AT 4 PM, Disp: 180 tablet, Rfl: 1   folic acid (FOLVITE) 1 MG tablet, TAKE 1 TABLET(1 MG) BY MOUTH DAILY, Disp: 90 tablet, Rfl: 1   lisinopril-hydrochlorothiazide (ZESTORETIC) 20-25 MG tablet, TAKE 1 TABLET BY MOUTH DAILY, Disp: 90 tablet, Rfl: 1   Magnesium 250 MG TABS, Take 250 mg by mouth daily. , Disp: , Rfl:    Multiple Vitamin (MULTIVITAMIN WITH MINERALS) TABS tablet, Take 1 tablet by mouth daily., Disp:  , Rfl:    senna-docusate (SENOKOT-S) 8.6-50 MG tablet, Take 2 tablets by mouth daily., Disp:  , Rfl:    traZODone (DESYREL) 100 MG tablet, TAKE 1 TABLET(100 MG) BY MOUTH AT BEDTIME AS NEEDED FOR SLEEP, Disp: 90 tablet, Rfl: 1   triamcinolone (KENALOG) 0.1 %, Apply 1 application topically 2 (two) times daily., Disp: 30 g, Rfl: 0  Review of Systems:  Constitutional: Denies fever, chills, diaphoresis and fatigue.  HEENT: Denies photophobia, eye pain, redness, hearing loss, ear pain, congestion, sore throat, rhinorrhea, sneezing, mouth sores, trouble swallowing, neck pain, neck stiffness and tinnitus.   Respiratory: Denies SOB, DOE, cough, chest tightness,  and wheezing.   Cardiovascular: Denies chest pain, palpitations and leg swelling.  Gastrointestinal: Denies nausea, vomiting, abdominal pain, diarrhea, constipation, blood in stool and  abdominal distention.  Genitourinary: Denies dysuria, urgency, frequency, hematuria, flank pain and difficulty urinating.  Endocrine: Denies: hot or cold intolerance, sweats, changes in hair or nails, polyuria, polydipsia. Musculoskeletal: Denies myalgias, back pain, joint swelling, arthralgias and gait problem.  Skin: Denies pallor, rash and wound.  Neurological: Denies dizziness, seizures, syncope, weakness, light-headedness, numbness and headaches.  Hematological: Denies adenopathy. Easy bruising, personal or family bleeding history  Psychiatric/Behavioral: Denies suicidal ideation, mood changes, confusion, nervousness, sleep disturbance and agitation    Physical Exam: Vitals:   05/28/21 1524  BP: (!) 100/58  Pulse: 83  Temp: 98.1 F (36.7 C)  TempSrc: Oral  SpO2: 97%  Weight: 161 lb (73 kg)    Body mass index is 24.48 kg/m.   Constitutional: NAD, calm, comfortable Eyes: PERRL, lids and conjunctivae normal, wears corrective lenses ENMT: Mucous membranes  are moist.  Respiratory: clear to auscultation bilaterally, no wheezing, no crackles. Normal respiratory effort. No accessory muscle use.  Cardiovascular: Regular rate and rhythm, no murmurs / rubs / gallops. No extremity edema. 2+ pedal pulses. No carotid bruits.  Skin: Multiple small bruises, purpura and blood blisters over both forearms, arms and hands Psychiatric: Normal judgment and insight. Alert and oriented x 3. Normal mood.    Impression and Plan:  Weight loss -He will try and have a meal replacement shake or protein shake at lunchtime, have advised that if this does not halt his weight loss we may need to consider dietitian evaluation. -He has no true difficulty swallowing.  Senile purpura (HCC) -Noted, due to age and aspirin.  Time spent: 31 minutes reviewing chart, interviewing and examining patient and formulating plan of care.     Lelon Frohlich, MD [facility]

## 2021-06-02 ENCOUNTER — Telehealth: Payer: Self-pay | Admitting: *Deleted

## 2021-06-02 NOTE — Telephone Encounter (Signed)
Patient is requesting a refill of pantoprazole (PROTONIX) 40 MG tablet .  Patient started medication while hospitalized but stopped after symptoms subsided.  He now is having some nausea in the morning and wonders if it is "due to some of his medications"?  Okay to refill?

## 2021-06-04 MED ORDER — PANTOPRAZOLE SODIUM 40 MG PO TBEC: 40.0000 mg | DELAYED_RELEASE_TABLET | Freq: Every day | ORAL | 1 refills | Status: DC

## 2021-06-04 NOTE — Telephone Encounter (Signed)
Left detailed message on machine for patient that the refill has been sent.

## 2021-06-09 ENCOUNTER — Other Ambulatory Visit: Payer: Self-pay

## 2021-06-09 ENCOUNTER — Ambulatory Visit (INDEPENDENT_AMBULATORY_CARE_PROVIDER_SITE_OTHER): Payer: PPO | Admitting: Family Medicine

## 2021-06-09 ENCOUNTER — Encounter: Payer: Self-pay | Admitting: Family Medicine

## 2021-06-09 VITALS — BP 106/60 | HR 79 | Temp 98.2°F | Wt 155.2 lb

## 2021-06-09 DIAGNOSIS — R634 Abnormal weight loss: Secondary | ICD-10-CM

## 2021-06-09 DIAGNOSIS — R17 Unspecified jaundice: Secondary | ICD-10-CM

## 2021-06-09 LAB — HEPATIC FUNCTION PANEL
ALT: 537 U/L — ABNORMAL HIGH (ref 0–53)
AST: 592 U/L — ABNORMAL HIGH (ref 0–37)
Albumin: 3.4 g/dL — ABNORMAL LOW (ref 3.5–5.2)
Alkaline Phosphatase: 2387 U/L — ABNORMAL HIGH (ref 39–117)
Bilirubin, Direct: 10.2 mg/dL — ABNORMAL HIGH (ref 0.0–0.3)
Total Bilirubin: 18.5 mg/dL — ABNORMAL HIGH (ref 0.2–1.2)
Total Protein: 6.4 g/dL (ref 6.0–8.3)

## 2021-06-09 LAB — BASIC METABOLIC PANEL
BUN: 29 mg/dL — ABNORMAL HIGH (ref 6–23)
CO2: 27 mEq/L (ref 19–32)
Calcium: 9.2 mg/dL (ref 8.4–10.5)
Chloride: 89 mEq/L — ABNORMAL LOW (ref 96–112)
Creatinine, Ser: 1.14 mg/dL (ref 0.40–1.50)
GFR: 64.75 mL/min (ref >60.00)
Glucose, Bld: 94 mg/dL (ref 70–99)
Potassium: 4.1 mEq/L (ref 3.5–5.1)
Sodium: 125 mEq/L — ABNORMAL LOW (ref 135–145)

## 2021-06-09 LAB — CBC WITH DIFFERENTIAL/PLATELET
Basophils Relative: 0 % (ref 0.0–3.0)
Eosinophils Relative: 2 % (ref 0.0–5.0)
HCT: 34.7 % — ABNORMAL LOW (ref 39.0–52.0)
Hemoglobin: 11.6 g/dL — ABNORMAL LOW (ref 13.0–17.0)
Lymphocytes Relative: 8 % — ABNORMAL LOW (ref 12.0–46.0)
MCHC: 33.4 g/dL (ref 30.0–36.0)
MCV: 90.6 fl (ref 78.0–100.0)
Monocytes Relative: 11 % (ref 3.0–12.0)
Neutrophils Relative %: 79 % — ABNORMAL HIGH (ref 43.0–77.0)
Platelets: 458 10*3/uL — ABNORMAL HIGH (ref 150.0–400.0)
RBC: 3.83 Mil/uL — ABNORMAL LOW (ref 4.22–5.81)
RDW: 14.1 % (ref 11.5–15.5)
WBC: 13.5 10*3/uL — ABNORMAL HIGH (ref 4.0–10.5)

## 2021-06-09 LAB — LIPASE: Lipase: 51 U/L (ref 11.0–59.0)

## 2021-06-09 LAB — AMYLASE: Amylase: 70 U/L (ref 27–131)

## 2021-06-09 NOTE — Progress Notes (Signed)
   Subjective:    Patient ID: Ruben Chavez, male    DOB: 1951/07/22, 70 y.o.   MRN: [id number]  HPI Here with his wife for the onset several days ago of jaundice. His sclera are yellow, his stools are lighter in color than usual, and his urine is brown. He has been losing weight for the past few months and his appetite is quite poor. In fact he was seen here on 05-28-21 and he has lost 7 lbs since then. He denies any pain or fever or nausea. No recent travel or trauma. He has been taking Lipitor for several years. Of note his mother died from a rare form of small bowel cancer than involved her bile duct. He has been treated for prostate cancer with radioactive seeds, and his recent PSA was reportedly "normal".    Review of Systems  Constitutional:  Positive for appetite change and unexpected weight change. Negative for fever.  Respiratory: Negative.    Cardiovascular: Negative.   Gastrointestinal: Negative.   Genitourinary: Negative.       Objective:   Physical Exam Constitutional:      Comments: He is obviously jaundiced with yellow sclerae   Cardiovascular:     Rate and Rhythm: Normal rate and regular rhythm.     Pulses: Normal pulses.     Heart sounds: Normal heart sounds.  Pulmonary:     Effort: Pulmonary effort is normal.     Breath sounds: Normal breath sounds.  Abdominal:     General: Abdomen is flat. Bowel sounds are normal. There is no distension.     Palpations: Abdomen is soft.     Tenderness: There is no abdominal tenderness. There is no guarding or rebound.     Hernia: No hernia is present.     Comments: The liver edge is palpable about 3 cm below the right inferior rib margin   Lymphadenopathy:     Cervical: No cervical adenopathy.  Skin:    Coloration: Skin is jaundiced.     Findings: No rash.  Neurological:     Mental Status: He is alert and oriented to person, place, and time.          Assessment & Plan:  He has had anorexia and unintentional weight  loss, now with jaundice. I am concerned about a likely obstruction to the bile duct. We will set up a contrasted CT of the abdomen and pelvis asap. Get labs today including liver enzymes and CBC. Rule out hepatitis. He has a follow up visit scheduled with Dr. Jerilee Hoh, his PCP, on Friday. We spent 35 minutes discussing these issues.  Alysia Penna, MD

## 2021-06-10 ENCOUNTER — Telehealth: Payer: Self-pay | Admitting: Internal Medicine

## 2021-06-10 LAB — HEPATITIS C ANTIBODY
Hepatitis C Ab: NONREACTIVE
SIGNAL TO CUT-OFF: 0.01 (ref <1.00)

## 2021-06-10 LAB — HEPATITIS A ANTIBODY, IGM: Hep A IgM: NONREACTIVE

## 2021-06-10 LAB — HEPATITIS B SURFACE ANTIGEN: Hepatitis B Surface Ag: NONREACTIVE

## 2021-06-10 NOTE — Telephone Encounter (Signed)
Pt is calling to see if he could speak w/Dr.Fry to add some information about his visit on yesterday. Pt only wanted to speak w/Dr. Sarajane Jews.

## 2021-06-11 ENCOUNTER — Other Ambulatory Visit: Payer: Self-pay

## 2021-06-11 ENCOUNTER — Ambulatory Visit (INDEPENDENT_AMBULATORY_CARE_PROVIDER_SITE_OTHER)
Admission: RE | Admit: 2021-06-11 | Discharge: 2021-06-11 | Disposition: A | Discharge status: Home / Self Care | Payer: PPO | Source: Ambulatory Visit | Attending: Family Medicine | Admitting: Family Medicine

## 2021-06-11 DIAGNOSIS — R17 Unspecified jaundice: Secondary | ICD-10-CM | POA: Diagnosis not present

## 2021-06-11 DIAGNOSIS — N200 Calculus of kidney: Secondary | ICD-10-CM | POA: Diagnosis not present

## 2021-06-11 DIAGNOSIS — R634 Abnormal weight loss: Secondary | ICD-10-CM

## 2021-06-11 DIAGNOSIS — R630 Anorexia: Secondary | ICD-10-CM | POA: Diagnosis not present

## 2021-06-11 MED ORDER — IOHEXOL 300 MG/ML  SOLN
100.0000 mL | Freq: Once | INTRAMUSCULAR | Status: AC | PRN
  Administered 2021-06-11: 100 mL via INTRAVENOUS

## 2021-06-11 NOTE — Progress Notes (Signed)
Carelink Summary Report / Loop Recorder 

## 2021-06-11 NOTE — Telephone Encounter (Signed)
Pt is calling to check the status of the below msg and wanted to know if he could get a call back today he is aware that the provider works a 1/2 day today.

## 2021-06-12 ENCOUNTER — Ambulatory Visit (INDEPENDENT_AMBULATORY_CARE_PROVIDER_SITE_OTHER): Payer: PPO | Admitting: Internal Medicine

## 2021-06-12 VITALS — BP 102/68 | HR 79 | Temp 98.0°F | Wt 154.0 lb

## 2021-06-12 DIAGNOSIS — R17 Unspecified jaundice: Secondary | ICD-10-CM | POA: Diagnosis not present

## 2021-06-12 DIAGNOSIS — R9389 Abnormal findings on diagnostic imaging of other specified body structures: Secondary | ICD-10-CM | POA: Diagnosis not present

## 2021-06-12 DIAGNOSIS — R634 Abnormal weight loss: Secondary | ICD-10-CM

## 2021-06-12 DIAGNOSIS — I700 Atherosclerosis of aorta: Secondary | ICD-10-CM | POA: Diagnosis not present

## 2021-06-12 NOTE — Telephone Encounter (Signed)
Pt is calling back to speak with Izora Gala

## 2021-06-12 NOTE — Telephone Encounter (Signed)
Pt has a f/u visit with PCP today

## 2021-06-12 NOTE — Progress Notes (Signed)
   Established Patient Office Visit     This visit occurred during the SARS-CoV-2 public health emergency.  Safety protocols were in place, including screening questions prior to the visit, additional usage of staff PPE, and extensive cleaning of exam room while observing appropriate contact time as indicated for disinfecting solutions.    CC/Reason for Visit: Follow-up labs and CT scan.  HPI: Ruben Chavez is a 70 y.o. male who is coming in today for the above mentioned reasons.  He is here today with his wife to follow-up on labs and CT scan.  I saw him on June 23 at which time he had been complaining of a slow gradual weight loss and fatigue.  We discussed a dietary log, incorporating some meal replacement shakes and follow-up.  In the interim he developed jaundice and saw Dr. Fry on 7/5.  Labs and CT scan were ordered.  Labs demonstrated marked  transaminitis: With a total bilirubin of 18.5, alk phos of 2387, AST 592, ALT 537.  Acute hepatitis markers were negative, lipase and amylase were normal, CBC demonstrated mild anemia with hemoglobin of 11.6 and leukocytosis of 13.5.  A CT scan of the abdomen and pelvis was performed yesterday with findings of moderate to marked intrahepatic bile duct dilatation with an ill-defined, infiltrative soft tissue within the convergence of the dilated intrahepatic bile ducts raising concern for cholangiocarcinoma.  He is here today to discuss these findings.  He has lost 7 additional pounds since his visit on June 23.  Past Medical/Surgical History: Past Medical History:  Diagnosis Date   Elevated PSA    Hyperlipidemia    pt says not anymore   Hypertension    Prostate cancer (HCC) 2013    Past Surgical History:  Procedure Laterality Date   bilateral inguinal hernia  2008   BUBBLE STUDY  01/28/2020   Procedure: BUBBLE STUDY;  Surgeon: Nishan, Peter C, MD;  Location: [facility];  Service: Cardiovascular;;   CYST REMOVAL TRUNK  2016    sebaceous cyst   CYSTOSCOPY  04/22/2016   Procedure: CYSTOSCOPY;  Surgeon: Stephen Dahlstedt, MD;  Location: [facility];  Service: Urology;;   IR ANGIO INTRA EXTRACRAN SEL COM CAROTID INNOMINATE BILAT MOD SED  05/30/2020   IR ANGIO VERTEBRAL SEL SUBCLAVIAN INNOMINATE UNI R MOD SED  01/25/2020   IR ANGIO VERTEBRAL SEL VERTEBRAL BILAT MOD SED  05/30/2020   IR CT HEAD LTD  01/25/2020   IR PERCUTANEOUS ART THROMBECTOMY/INFUSION INTRACRANIAL INC DIAG ANGIO  01/25/2020   IR US GUIDE VASC ACCESS RIGHT  05/30/2020   KNEE ARTHROSCOPY  2007   right   LOOP RECORDER INSERTION N/A 01/28/2020   Procedure: LOOP RECORDER INSERTION;  Surgeon: Klein, Steven C, MD;  Location: [facility];  Service: Cardiovascular;  Laterality: N/A;   PROSTATE BIOPSY  2008   PROSTATE BIOPSY  2013   PROSTATE BIOPSY  01/28/2016   RADIOACTIVE SEED IMPLANT N/A 04/22/2016   Procedure: RADIOACTIVE SEED IMPLANT/BRACHYTHERAPY IMPLANT;  Surgeon: Stephen Dahlstedt, MD;  Location: [facility];  Service: Urology;  Laterality: N/A;   RADIOLOGY WITH ANESTHESIA N/A 01/24/2020   Procedure: IR WITH ANESTHESIA;  Surgeon: Deveshwar, Sanjeev, MD;  Location: [facility];  Service: Radiology;  Laterality: N/A;   TEE WITHOUT CARDIOVERSION N/A 01/28/2020   Procedure: TRANSESOPHAGEAL ECHOCARDIOGRAM (TEE);  Surgeon: Nishan, Peter C, MD;  Location: [facility];  Service: Cardiovascular;  Laterality: N/A;   TONSILLECTOMY     TOTAL HIP ARTHROPLASTY    2001   left   TOTAL HIP ARTHROPLASTY Right 01/18/2018   Procedure: RIGHT TOTAL HIP ARTHROPLASTY ANTERIOR APPROACH;  Surgeon: Aluisio, Frank, MD;  Location: [facility];  Service: Orthopedics;  Laterality: Right;    Social History:  reports that he quit smoking about 22 years ago. His smoking use included cigarettes. He has a 32.00 pack-year smoking history. He has never used smokeless tobacco. He reports current alcohol use of about 14.0 standard drinks of alcohol per week. He reports that  he does not use drugs.  Allergies: Allergies  Allergen Reactions   Sulfa Antibiotics Swelling and Other (See Comments)    Swelling in ankles    Advil [Ibuprofen] Other (See Comments)    "Dots on chest" (Petechiae)   Aleve [Naproxen] Other (See Comments)    "Dots on chest" (Petechiae)   Betadine [Povidone Iodine] Itching and Rash   Chloroxylenol (Antiseptic) Itching, Rash and Other (See Comments)    PCMX surgical sterilizing scrub   Poison Ivy Extract Rash   Povidone-Iodine Itching and Rash    BETADINE    Family History:  Family History  Problem Relation Age of Onset   Pancreatic cancer Mother    Heart disease Father    Colon cancer Neg Hx      Current Outpatient Medications:    aspirin EC 325 MG EC tablet, Take 1 tablet (325 mg total) by mouth daily., Disp: 30 tablet, Rfl: 0   atorvastatin (LIPITOR) 20 MG tablet, TAKE 1 TABLET(20 MG) BY MOUTH DAILY AT 6 PM, Disp: 90 tablet, Rfl: 1   buPROPion (WELLBUTRIN XL) 300 MG 24 hr tablet, TAKE 1 TABLET(300 MG) BY MOUTH DAILY, Disp: 90 tablet, Rfl: 1   buPROPion (WELLBUTRIN) 75 MG tablet, TAKE 1 TABLET(75 MG) BY MOUTH TWICE DAILY AT 10 AM AND AT 4 PM, Disp: 180 tablet, Rfl: 1   folic acid (FOLVITE) 1 MG tablet, TAKE 1 TABLET(1 MG) BY MOUTH DAILY, Disp: 90 tablet, Rfl: 1   lisinopril-hydrochlorothiazide (ZESTORETIC) 20-25 MG tablet, TAKE 1 TABLET BY MOUTH DAILY, Disp: 90 tablet, Rfl: 1   Magnesium 250 MG TABS, Take 250 mg by mouth daily. , Disp: , Rfl:    Multiple Vitamin (MULTIVITAMIN WITH MINERALS) TABS tablet, Take 1 tablet by mouth daily., Disp:  , Rfl:    pantoprazole (PROTONIX) 40 MG tablet, Take 1 tablet (40 mg total) by mouth daily., Disp: 90 tablet, Rfl: 1   senna-docusate (SENOKOT-S) 8.6-50 MG tablet, Take 2 tablets by mouth daily., Disp:  , Rfl:    traZODone (DESYREL) 100 MG tablet, TAKE 1 TABLET(100 MG) BY MOUTH AT BEDTIME AS NEEDED FOR SLEEP, Disp: 90 tablet, Rfl: 1   triamcinolone (KENALOG) 0.1 %, Apply 1 application  topically 2 (two) times daily., Disp: 30 g, Rfl: 0  Review of Systems:  Constitutional: Denies fever, chills, diaphoresis, positive for appetite change and fatigue.  HEENT: Denies photophobia, eye pain, redness, hearing loss, ear pain, congestion, sore throat, rhinorrhea, sneezing, mouth sores, trouble swallowing, neck pain, neck stiffness and tinnitus.   Respiratory: Denies SOB, DOE, cough, chest tightness,  and wheezing.   Cardiovascular: Denies chest pain, palpitations and leg swelling.  Gastrointestinal: Denies nausea, vomiting, abdominal pain, diarrhea, constipation, blood in stool and abdominal distention.  Genitourinary: Denies dysuria, urgency, frequency, hematuria, flank pain and difficulty urinating.  Endocrine: Denies: hot or cold intolerance, sweats, changes in hair or nails, polyuria, polydipsia. Musculoskeletal: Denies myalgias, back pain, joint swelling, arthralgias and gait problem.  Skin: Denies pallor, rash and wound.  Neurological:   Denies dizziness, seizures, syncope, weakness, light-headedness, numbness and headaches.  Hematological: Denies adenopathy. Easy bruising, personal or family bleeding history  Psychiatric/Behavioral: Denies suicidal ideation, mood changes, confusion, nervousness, sleep disturbance and agitation    Physical Exam: Vitals:   06/12/21 1028  BP: 102/68  Pulse: 79  Temp: 98 F (36.7 C)  TempSrc: Oral  SpO2: 98%  Weight: 154 lb (69.9 kg)    Body mass index is 23.42 kg/m.   Constitutional: NAD, calm, comfortable, yellow tint to conjunctiva, sclera and skin Eyes: PERRL, lids and conjunctivae normal ENMT: Mucous membranes are moist.  Respiratory: clear to auscultation bilaterally, no wheezing, no crackles. Normal respiratory effort. No accessory muscle use.  Cardiovascular: Regular rate and rhythm, no murmurs / rubs / gallops. No extremity edema. 2+ pedal pulses. No carotid bruits.  Abdomen: no tenderness, no masses palpated. No  hepatosplenomegaly. Bowel sounds positive.  Skin: He is jaundiced   Impression and Plan:  Jaundice  Weight loss, unintentional  Abnormal CT scan  -Appears to possibly have cholangiocarcinoma based on CT scan findings. No pancreatic mass on CT. -Urgent referral to GI has been placed as he will likely need ERCP for biopsy and palliative stenting.  Further recommendations to follow but will likely need oncology referral as well.  Aortic atherosclerosis -Incidental finding on CT.  He is on statin therapy.  Time spent: 33 minutes reviewing chart, interviewing and discussing findings with patient and wife, examining patient and formulating plan of care.     Lelon Frohlich, MD [facility]

## 2021-06-18 ENCOUNTER — Telehealth (HOSPITAL_COMMUNITY): Payer: Self-pay

## 2021-06-18 ENCOUNTER — Encounter: Payer: Self-pay | Admitting: Adult Health

## 2021-06-18 ENCOUNTER — Ambulatory Visit: Payer: PPO | Admitting: Adult Health

## 2021-06-18 VITALS — BP 104/67 | HR 74 | Ht 71.0 in | Wt 151.2 lb

## 2021-06-18 DIAGNOSIS — I10 Essential (primary) hypertension: Secondary | ICD-10-CM

## 2021-06-18 DIAGNOSIS — I639 Cerebral infarction, unspecified: Secondary | ICD-10-CM

## 2021-06-18 DIAGNOSIS — E785 Hyperlipidemia, unspecified: Secondary | ICD-10-CM | POA: Diagnosis not present

## 2021-06-18 NOTE — Telephone Encounter (Signed)
Called to schedule us carotid, no answer, left vm. AW  

## 2021-06-18 NOTE — Progress Notes (Signed)
I agree with the above plan 

## 2021-06-18 NOTE — Patient Instructions (Signed)
Continue aspirin 325 mg daily  and atorvastatin 20 mg daily for secondary stroke prevention  Will reach out to Dr. Arlean Hopping office to see if follow up visit is needed - if you do not hear from his office by end of next week, please ensure you call them  Continue to follow up with PCP regarding cholesterol and blood pressure management  Maintain strict control of hypertension with blood pressure goal below 130/90 and cholesterol with LDL cholesterol (bad cholesterol) goal below 70 mg/dL.        Thank you for coming to see [place] at [facility]. I hope we have been able to provide you high quality care today.  You may receive a patient satisfaction survey over the next few weeks. We would appreciate your feedback and comments so that we may continue to improve ourselves and the health of our patients.

## 2021-06-18 NOTE — Progress Notes (Signed)
[facility] [address] [place]. [name] [phone]       STROKE FOLLOW UP NOTE  [name] Date of Birth:  11/11/1951 Medical Record Number:  [id number]   Reason for Referral: stroke follow up    SUBJECTIVE:   CHIEF COMPLAINT:  Chief Complaint  Patient presents with   Stroke    Rm 2, 6 month FU, wife Ruben Chavez     HPI:   Today, 06/18/2021, Ruben Chavez returns for 2-monthstroke follow-up accompanied by his wife KZigmund Chavez  Stable since prior visit without new stroke/TIA symptoms.  Reports residual LUE weakness and hand spasticity which has been stable without worsening - continues to follow with PMR Dr. LDagoberto Chavez Dr. KLetta Chavez Botox injections last received 05/05/2021.  He also reports continued short-term memory difficulties which have slightly worsened in setting of recent health concerns.  Reports compliance on aspirin and atorvastatin without associated side effects.  Blood pressure today 104/67.  Loop recorder has not shown atrial fibrillation thus far.  He has evaluation tomorrow with GI for jaundice, weight loss and possible cholangiocarcinoma based on CT findings.     History provided for reference purposes only Update 11/12/2020 JM: Mr. BSkibinskireturns for 6 month stroke follow up. Reports residual LUE weakness and hand spasticity.  He denies residual speech or cognitive impairment.  He has returned back to driving without difficulty after passing driving evaluation in June.  Denies new stroke/TIA symptoms.  Remains on aspirin and atorvastatin for secondary stroke prevention without side effects.  Blood pressure today 120/72.  Loop recorder has not shown atrial fibrillation thus far.  Underwent cerebral angiogram with Dr. DEstanislado Pandy6/25/2021 which showed continued right MCA occlusion, left ICA ulcerated plaque and R VA 30% stenosis.  He has multiple questions regarding study which were answered to the best of my ability.  No further  concerns at this time.  Update 05/13/2020 [name] being seen for follow-up regarding right MCA stroke in 01/2020.  Residual deficits of LUE weakness, mild dysarthria and cognitive impairment with short term memory concern. Continues to work with therapies with ongoing improvement.  Recent referral placed for driving evaluation as there was concern from current therapist regarding return to driving in regards to safety with altered awareness and cognition. Scheduled on June 23rd.  Completed 3 months DAPT and continues on aspirin alone as well as continuation of atorvastatin.  Blood pressure today 116/63.  Loop recorder is not shown atrial fibrillation thus far.  No concerns at this time.  Initial visit 03/19/2020, Ruben Chavez being seen for hospital follow-up.  Residual deficits of LUE weakness, left facial droop, dysarthria and cognitive communication deficit.  He is currently being followed by PT/OT/ST for ongoing therapy needs.  He has not been able to return to driving at this time and feels as though this is due to decreased function of left hand.  Per review of ST note, suspicion for mild to moderate cognitive communication deficits with reduced awareness/insight of deficits.  He has continued on DAPT without bleeding or bruising.  Atorvastatin 20 mg daily initiated for stroke prevention and denies myalgias.  Blood pressure stable at 128/72.  Loop recorder has not shown atrial fibrillation thus far.  Continues to follow with PCP for HTN management.  No further concerns at this time.  Stroke admission 01/24/2020: Mr. Ruben SHEINis a 70y.o. male with history of HTN, HLD, prostate cancer who presented on 01/24/2020 with L sided  weakness, L hemisensory loss, L facial droop, L field cut, dysarthria and anosognosia.  Evaluated by stroke team with stroke work-up revealing right MCA infarct due to right MCA occlusion s/p TPA and IR with TICI 2B recanalization -embolic secondary to unknown  source.  Loop recorder placed to rule out atrial fibrillation.  Recommended DAPT for 3 months then aspirin alone.  Hypertensive urgency upon arrival with BP 180/120 stabilized during admission and resumed lisinopril 20 mg daily.  No history of DM or HLD.  No prior history of stroke.  Other active problems include history of prostate cancer, hypokalemia, hypocalcemia, leukocytosis and urinary retention.  Residual deficits of mild leftfacialdroop, left hemiparesis and decreased sensation and discharged to CIR for ongoing therapy needs.  Stroke:   [name] CT head subacute R lateral temporal lobe and temporoparietal area infarcts.    CTA head & neck R M2 superior division occlusion. B ICA bifurcation atherosclerosis.  CT perfusion pseudonormalization R temporal lobe. R frontoparietal jxn infarct 46cc, R MCA 72cc. Cerebral angio TICI2b revascularization R MCA MRI - Patchy and scattered acute right MCA territory infarct. Petechial blood in the right temporal lobe. 2D Echo - EF 60 - 65%. No cardiac source of emboli identified.  Chavez venous Doppler - negative for DVT bilaterally TEE neg PFO or other SOE  loop placement 2/22 Ruben Chavez)   LDL 54 - no statin needed given goal LDL HgbA1c 5.0 UDS pending Lovenox for VTE prophylaxis No antithrombotic prior to admission, now on ASA 325 mg and Plavix 75 daily. Continue DAPT for 3 months and then ASA alone. Therapy recommendations:  CIR Disposition:  CIR      ROS:   14 system review of systems performed and negative with exception of those listed in HPI  PMH:  Past Medical History:  Diagnosis Date   Elevated PSA    Hyperlipidemia    pt says not anymore   Hypertension    Prostate cancer ([place]) 2013    PSH:  Past Surgical History:  Procedure Laterality Date   bilateral inguinal hernia  2008   BUBBLE STUDY  01/28/2020   Procedure: BUBBLE STUDY;   Surgeon: Ruben Hector, MD;  Location: [facility];  Service: Cardiovascular;;   CYST REMOVAL TRUNK  2016   sebaceous cyst   CYSTOSCOPY  04/22/2016   Procedure: CYSTOSCOPY;  Surgeon: [name], MD;  Location: [facility];  Service: Urology;;   IR ANGIO INTRA EXTRACRAN SEL COM CAROTID INNOMINATE BILAT MOD SED  05/30/2020   IR ANGIO VERTEBRAL SEL SUBCLAVIAN INNOMINATE UNI R MOD SED  01/25/2020   IR ANGIO VERTEBRAL SEL VERTEBRAL BILAT MOD SED  05/30/2020   IR CT HEAD LTD  01/25/2020   IR PERCUTANEOUS ART THROMBECTOMY/INFUSION INTRACRANIAL INC DIAG ANGIO  01/25/2020   IR US GUIDE VASC ACCESS RIGHT  05/30/2020   KNEE ARTHROSCOPY  2007   right   LOOP RECORDER INSERTION N/A 01/28/2020   Procedure: LOOP RECORDER INSERTION;  Surgeon: [name], MD;  Location: [facility];  Service: Cardiovascular;  Laterality: N/A;   PROSTATE BIOPSY  2008   PROSTATE BIOPSY  2013   PROSTATE BIOPSY  01/28/2016   RADIOACTIVE SEED IMPLANT N/A 04/22/2016   Procedure: RADIOACTIVE SEED IMPLANT/BRACHYTHERAPY IMPLANT;  Surgeon: [name], MD;  Location[facility];  Service: Urology;  Laterality: N/A;   RADIOLOGY WITH ANESTHESIA N/A 01/24/2020  Procedure: IR WITH ANESTHESIA;  Surgeon: [name], MD;  Location: [facility];  Service: Radiology;  Laterality: N/A;   TEE WITHOUT CARDIOVERSION N/A 01/28/2020   Procedure: TRANSESOPHAGEAL ECHOCARDIOGRAM (TEE);  Surgeon: Ruben Hector, MD;  Location: [facility];  Service: Cardiovascular;  Laterality: N/A;   TONSILLECTOMY     TOTAL HIP ARTHROPLASTY  2001   left   TOTAL HIP ARTHROPLASTY Right 01/18/2018   Procedure: RIGHT TOTAL HIP ARTHROPLASTY ANTERIOR APPROACH;  Surgeon: [name], MD;  Location: [facility];  Service: Orthopedics;  Laterality: Right;    Social History:  Social History   Socioeconomic History   Marital status: Married    Spouse name: Ruben Chavez   Number of children: Not on file   Years of education: Not  on file   Highest education level: Not on file  Occupational History   Not on file  Tobacco Use   Smoking status: Former    Packs/day: 1.00    Years: 32.00    Pack years: 32.00    Types: Cigarettes    Quit date: 06/06/1999    Years since quitting: 22.0   Smokeless tobacco: Never  Vaping Use   Vaping Use: Never used  Substance and Sexual Activity   Alcohol use: Yes    Alcohol/week: 14.0 standard drinks    Types: 14 Shots of liquor per week   Drug use: No   Sexual activity: Yes  Other Topics Concern   Not on file  Social History Narrative   Not on file   Social Determinants of Health   Financial Resource Strain: Not on file  Food Insecurity: Not on file  Transportation Needs: Not on file  Physical Activity: Not on file  Stress: Not on file  Social Connections: Not on file  Intimate Partner Violence: Not on file    Family History:  Family History  Problem Relation Age of Onset   Pancreatic cancer Mother    Heart disease Father    Colon cancer Neg Hx     Medications:   Current Outpatient Medications on File Prior to Visit  Medication Sig Dispense Refill   aspirin EC 325 MG EC tablet Take 1 tablet (325 mg total) by mouth daily. 30 tablet 0   atorvastatin (LIPITOR) 20 MG tablet TAKE 1 TABLET(20 MG) BY MOUTH DAILY AT 6 PM 90 tablet 1   buPROPion (WELLBUTRIN XL) 300 MG 24 hr tablet TAKE 1 TABLET(300 MG) BY MOUTH DAILY 90 tablet 1   buPROPion (WELLBUTRIN) 75 MG tablet TAKE 1 TABLET(75 MG) BY MOUTH TWICE DAILY AT 10 AM AND AT 4 PM 423 tablet 1   folic acid (FOLVITE) 1 MG tablet TAKE 1 TABLET(1 MG) BY MOUTH DAILY 90 tablet 1   lisinopril-hydrochlorothiazide (ZESTORETIC) 20-25 MG tablet TAKE 1 TABLET BY MOUTH DAILY 90 tablet 1   Magnesium 250 MG TABS Take 250 mg by mouth daily.      Multiple Vitamin (MULTIVITAMIN WITH MINERALS) TABS tablet Take 1 tablet by mouth daily.     pantoprazole (PROTONIX) 40 MG tablet Take 1 tablet (40 mg total) by mouth daily. 90 tablet 1    senna-docusate (SENOKOT-S) 8.6-50 MG tablet Take 2 tablets by mouth daily.     traZODone (DESYREL) 100 MG tablet TAKE 1 TABLET(100 MG) BY MOUTH AT BEDTIME AS NEEDED FOR SLEEP 90 tablet 1   triamcinolone (KENALOG) 0.1 % Apply 1 application topically 2 (two) times daily. 30 g 0   No current facility-administered medications on file prior to visit.  Allergies:   Allergies  Allergen Reactions   Sulfa Antibiotics Swelling and Other (See Comments)    Swelling in ankles    Advil [Ibuprofen] Other (See Comments)    "Dots on chest" (Petechiae)   Aleve [Naproxen] Other (See Comments)    "Dots on chest" (Petechiae)   Betadine [Povidone Iodine] Itching and Rash   Chloroxylenol (Antiseptic) Itching, Rash and Other (See Comments)    PCMX surgical sterilizing scrub   Poison Ivy Extract Rash   Povidone-Iodine Itching and Rash    BETADINE      OBJECTIVE:  Vitals  Vitals:   06/18/21 1303  BP: 104/67  Pulse: 74  Weight: 151 lb 3.2 oz (68.6 kg)  Height: _0  (1.803 m)    Body mass index is 21.09 kg/m. No results found.   Physical exam:  General: Frail pleasant elderly Caucasian male, seated, in no evident distress with yellow tint to conjunctiva, sclera and skin Head: head normocephalic and atraumatic.   Neck: supple with no carotid or supraclavicular bruits Cardiovascular: regular rate and rhythm, no murmurs Musculoskeletal: no deformity Skin: Jaundiced throughout Vascular:  Normal pulses all extremities   Neurologic Exam Mental Status: Awake and fully alert. fluent speech and language. Oriented to place and time. Recent  and remote memory intact. Attention span, concentration and fund of knowledge appropriate during visit. Mood and affect appropriate.  Cranial Nerves: Pupils equal, briskly reactive to light. Extraocular movements full without nystagmus. Visual fields full to confrontation. Hearing intact. Facial sensation intact. Mild left lower facial weakness Motor: Full  strength in all tested extremities except left hand weakness with increased tone and limited flexibility Sensory.:  Intact light touch, pinprick and vibratory sensation Coordination: Rapid alternating movements normal in all extremities except decreased left hand. Finger-to-nose and heel-to-shin performed accurately bilaterally. Gait and Station: Arises from chair without difficulty. Stance is normal. Gait demonstrates normal stride length and balance Reflexes: 1+ and symmetric. Toes downgoing.        ASSESSMENT: CHANEY Chavez is a 70 y.o. year old male presented with left-sided weakness, left hemisensory loss, left facial droop, left field cut, dysarthria and anosognosia on 01/24/2020 with stroke work-up revealing right MCA infarct due to right MCA occlusion s/p TPA and IR with TICI 2B recanalization -embolic secondary to unknown source therefore loop recorder placed which has not shown atrial fibrillation . Vascular risk factors include HTN, HLD and history of EtOH use.  He is currently being worked up by GI for possible cholangiocarcinoma based on CT scan as well as lab work, jaundice skin and weight loss     PLAN:  Left MCA stroke:  -Residual deficits: Left hand weakness with increased spasticity and short term memory concerns.  Continue to follow with PMR Dr. Letta Pate for spasticity management. Short term memory concerns more likely in setting of recent medical issues with increased stress, lack of adequate nutrition and weight loss -Loop recorder has not shown atrial fibrillation thus far.  Continuously monitored by cardiology  -Continue aspirin 325 mg daily  and atorvastatin 20 mg daily for secondary stroke prevention.   -discussed secondary stroke prevention and importance of close PCP follow up for aggressive stroke risk management HTN: BP goal <130/90. Stable on lisinopril-hydrochlorothiazide per PCP HLD: LDL 70. On atorvastatin 13m daily monitored and prescribed by PCP -prior  lipid panel satisfactory Intra and extracranial stenosis: Monitored by Dr. DEstanislado Chavez Need to schedule f/u visit as carotid duplex recommended 6 months after prior angiogram which would have been 12/2020 -  will reach out to IR schedulers for further assistance    Overall stable from stroke standpoint routinely monitored by PCP for aggressive stroke risk factor management and recommend follow-up on an as-needed basis   CC:  [facility] provider: Dr. Eleanora Neighbor, MD    I spent 36 minutes of face-to-face and non-face-to-face time with patient and wife's.  This included previsit chart review, lab review, study review, order entry, electronic health record documentation, patient education and discussion regarding history of prior stroke as well as secondary stroke prevention measures and aggressive stroke risk factor management, residual deficits as well as concern of short-term memory and likely etiologies, recent cerebral arteriogram and indication for surveillance monitoring and answered all questions to patient and wife's satisfaction   Frann Rider, AGNP-BC  [facility] [address] [place],  [address]  Phone [phone] Fax [phone] Note: This document was prepared with digital dictation and possible smart phrase technology. Any transcriptional errors that result from this process are unintentional.

## 2021-06-19 ENCOUNTER — Ambulatory Visit (INDEPENDENT_AMBULATORY_CARE_PROVIDER_SITE_OTHER): Payer: PPO | Admitting: Gastroenterology

## 2021-06-19 ENCOUNTER — Telehealth: Payer: Self-pay

## 2021-06-19 ENCOUNTER — Other Ambulatory Visit (INDEPENDENT_AMBULATORY_CARE_PROVIDER_SITE_OTHER): Payer: PPO

## 2021-06-19 ENCOUNTER — Encounter: Payer: Self-pay | Admitting: Gastroenterology

## 2021-06-19 VITALS — BP 100/56 | HR 82 | Ht 71.0 in | Wt 150.2 lb

## 2021-06-19 DIAGNOSIS — R935 Abnormal findings on diagnostic imaging of other abdominal regions, including retroperitoneum: Secondary | ICD-10-CM

## 2021-06-19 DIAGNOSIS — K831 Obstruction of bile duct: Secondary | ICD-10-CM

## 2021-06-19 DIAGNOSIS — R17 Unspecified jaundice: Secondary | ICD-10-CM

## 2021-06-19 DIAGNOSIS — R634 Abnormal weight loss: Secondary | ICD-10-CM

## 2021-06-19 LAB — CBC WITH DIFFERENTIAL/PLATELET
Basophils Relative: 0 % (ref 0.0–3.0)
Eosinophils Relative: 0 % (ref 0.0–5.0)
HCT: 31 % — ABNORMAL LOW (ref 39.0–52.0)
Hemoglobin: 10.6 g/dL — ABNORMAL LOW (ref 13.0–17.0)
Lymphocytes Relative: 7 % — ABNORMAL LOW (ref 12.0–46.0)
MCHC: 34.2 g/dL (ref 30.0–36.0)
MCV: 88.7 fl (ref 78.0–100.0)
Monocytes Relative: 3 % (ref 3.0–12.0)
Neutrophils Relative %: 90 % — ABNORMAL HIGH (ref 43.0–77.0)
Platelets: 488 10*3/uL — ABNORMAL HIGH (ref 150.0–400.0)
RBC: 3.5 Mil/uL — ABNORMAL LOW (ref 4.22–5.81)
RDW: 14.7 % (ref 11.5–15.5)
WBC: 15.7 10*3/uL — ABNORMAL HIGH (ref 4.0–10.5)

## 2021-06-19 LAB — COMPREHENSIVE METABOLIC PANEL
ALT: 476 U/L — ABNORMAL HIGH (ref 0–53)
AST: 417 U/L — ABNORMAL HIGH (ref 0–37)
Albumin: 3.1 g/dL — ABNORMAL LOW (ref 3.5–5.2)
Alkaline Phosphatase: 2495 U/L — ABNORMAL HIGH (ref 39–117)
BUN: 24 mg/dL — ABNORMAL HIGH (ref 6–23)
CO2: 25 mEq/L (ref 19–32)
Calcium: 9 mg/dL (ref 8.4–10.5)
Chloride: 90 mEq/L — ABNORMAL LOW (ref 96–112)
Creatinine, Ser: 1.18 mg/dL (ref 0.40–1.50)
GFR: 62.11 mL/min (ref >60.00)
Glucose, Bld: 110 mg/dL — ABNORMAL HIGH (ref 70–99)
Potassium: 3 mEq/L — ABNORMAL LOW (ref 3.5–5.1)
Sodium: 124 mEq/L — ABNORMAL LOW (ref 135–145)
Total Bilirubin: 28.8 mg/dL — ABNORMAL HIGH (ref 0.2–1.2)
Total Protein: 6.5 g/dL (ref 6.0–8.3)

## 2021-06-19 LAB — PROTIME-INR
INR: 1.7 ratio — ABNORMAL HIGH (ref 0.8–1.0)
Prothrombin Time: 18.5 s — ABNORMAL HIGH (ref 9.6–13.1)

## 2021-06-19 NOTE — Progress Notes (Signed)
06/19/2021 Ruben Chavez [id number] [birth year]   HISTORY OF PRESENT ILLNESS: This is a 70 year old male who is a patient Dr. Lavina Hamman known him for only colonoscopy in 2017.  He is here today with his wife as a referral from Dr. Jerilee Hoh for jaundice and weight loss.  Patient is here today with his wife.  They tell me that he started noticing the jaundice about 2 weeks ago.  To me that his weight has dropped about 28 pounds over the past year.  Labs performed 10 days ago showed a bilirubin of 18.5 and an alk phos of over 2000 and AST and ALT in the 500s.  5 months ago LFTs were completely normal.  CT scan of the abdomen pelvis with and without contrast as follows:  IMPRESSION: 1. There is moderate to marked intrahepatic bile duct dilatation with transition to normal caliber common bile duct. There is a ill-defined, infiltrative soft tissue within the convergence of the dilated intrahepatic bile ducts. The diagnosis of exclusion is cholangiocarcinoma. More definitive characterization with contrast enhanced MRI/MRCP is recommended. 2. No evidence for nodal metastasis or solid organ metastasis within the abdomen or pelvis. 3. Nonobstructing right renal calculus. 4. Aortic atherosclerosis.   Aortic Atherosclerosis (ICD10-I70.0).  He denies fevers, chills, abdominal pain.  He is complaining of a lot of constipation saying that his last good bowel movement was a week ago.  His stools were clay colored at that time.  They tell me that he has not been eating or drinking much.  He has been taking 2 Senna every night.     Past Medical History:  Diagnosis Date   Elevated PSA    Hyperlipidemia    pt says not anymore   Hypertension    Prostate cancer ([place]) 2013   Past Surgical History:  Procedure Laterality Date   bilateral inguinal hernia  2008   BUBBLE STUDY  01/28/2020   Procedure: BUBBLE STUDY;  Surgeon: [name], MD;  [facility];  Service: Cardiovascular;;    CYST REMOVAL TRUNK  2016   sebaceous cyst   CYSTOSCOPY  04/22/2016   Procedure: CYSTOSCOPY;  Surgeon: [name], MD;  Location: [facility];  Service: Urology;;   IR ANGIO INTRA EXTRACRAN SEL COM CAROTID INNOMINATE BILAT MOD SED  05/30/2020   IR ANGIO VERTEBRAL SEL SUBCLAVIAN INNOMINATE UNI R MOD SED  01/25/2020   IR ANGIO VERTEBRAL SEL VERTEBRAL BILAT MOD SED  05/30/2020   IR CT HEAD LTD  01/25/2020   IR PERCUTANEOUS ART THROMBECTOMY/INFUSION INTRACRANIAL INC DIAG ANGIO  01/25/2020   IR US GUIDE VASC ACCESS RIGHT  05/30/2020   KNEE ARTHROSCOPY  2007   right   LOOP RECORDER INSERTION N/A 01/28/2020   Procedure: LOOP RECORDER INSERTION;  Surgeon: [name], MD;  Location: [facility];  Service: Cardiovascular;  Laterality: N/A;   PROSTATE BIOPSY  2008   PROSTATE BIOPSY  2013   PROSTATE BIOPSY  01/28/2016   RADIOACTIVE SEED IMPLANT N/A 04/22/2016   Procedure: RADIOACTIVE SEED IMPLANT/BRACHYTHERAPY IMPLANT;  Surgeon: [name], MD;  Location: [facility];  Service: Urology;  Laterality: N/A;   RADIOLOGY WITH ANESTHESIA N/A 01/24/2020   Procedure: IR WITH ANESTHESIA;  Surgeon: [name], MD;  Location: [facility];  Service: Radiology;  Laterality: N/A;   TEE WITHOUT CARDIOVERSION N/A 01/28/2020   Procedure: TRANSESOPHAGEAL ECHOCARDIOGRAM (TEE);  Surgeon: [name], MD;  Location: [facility];  Service: Cardiovascular;  Laterality: N/A;  TONSILLECTOMY     TOTAL HIP ARTHROPLASTY  2001   left   TOTAL HIP ARTHROPLASTY Right 01/18/2018   Procedure: RIGHT TOTAL HIP ARTHROPLASTY ANTERIOR APPROACH;  Surgeon: [name], MD;  Location: [facility];  Service: Orthopedics;  Laterality: Right;    reports that he quit smoking about 22 years ago. His smoking use included cigarettes. He has a 32.00 pack-year smoking history. He has never used smokeless tobacco. He reports current alcohol use of about 14.0 standard drinks of alcohol per week. He  reports that he does not use drugs. family history includes Heart disease in his father; Pancreatic cancer in his mother. Allergies  Allergen Reactions   Sulfa Antibiotics Swelling and Other (See Comments)    Swelling in ankles    Advil [Ibuprofen] Other (See Comments)    "Dots on chest" (Petechiae)   Aleve [Naproxen] Other (See Comments)    "Dots on chest" (Petechiae)   Betadine [Povidone Iodine] Itching and Rash   Chloroxylenol (Antiseptic) Itching, Rash and Other (See Comments)    PCMX surgical sterilizing scrub   Poison Ivy Extract Rash   Povidone-Iodine Itching and Rash    BETADINE      Outpatient Encounter Medications as of 06/19/2021  Medication Sig   aspirin EC 325 MG EC tablet Take 1 tablet (325 mg total) by mouth daily.   atorvastatin (LIPITOR) 20 MG tablet TAKE 1 TABLET(20 MG) BY MOUTH DAILY AT 6 PM   buPROPion (WELLBUTRIN XL) 300 MG 24 hr tablet TAKE 1 TABLET(300 MG) BY MOUTH DAILY   folic acid (FOLVITE) 1 MG tablet TAKE 1 TABLET(1 MG) BY MOUTH DAILY   lisinopril-hydrochlorothiazide (ZESTORETIC) 20-25 MG tablet TAKE 1 TABLET BY MOUTH DAILY   Magnesium 250 MG TABS Take 250 mg by mouth daily.    Multiple Vitamin (MULTIVITAMIN WITH MINERALS) TABS tablet Take 1 tablet by mouth daily.   pantoprazole (PROTONIX) 40 MG tablet Take 1 tablet (40 mg total) by mouth daily.   senna-docusate (SENOKOT-S) 8.6-50 MG tablet Take 2 tablets by mouth daily.   traZODone (DESYREL) 100 MG tablet TAKE 1 TABLET(100 MG) BY MOUTH AT BEDTIME AS NEEDED FOR SLEEP   triamcinolone (KENALOG) 0.1 % Apply 1 application topically 2 (two) times daily.   [DISCONTINUED] buPROPion (WELLBUTRIN) 75 MG tablet TAKE 1 TABLET(75 MG) BY MOUTH TWICE DAILY AT 10 AM AND AT 4 PM   No facility-administered encounter medications on file as of 06/19/2021.     REVIEW OF SYSTEMS  : All other systems reviewed and negative except where noted in the History of Present Illness.   PHYSICAL EXAM: BP (!) 100/56   Pulse 82    Ht _0  (1.803 m)   Wt 150 lb 3.2 oz (68.1 kg)   BMI 20.95 kg/m  General:  Ill-appearing white male in no acute distress; deeply jaundiced Head: Normocephalic and atraumatic Eyes:  Deep scleral icterus Ears: Normal auditory acuity Lungs: Clear throughout to auscultation; no W/R/R. Heart: Regular rate and rhythm; no M/R/G. Abdomen: Soft, non-distended.  BS present.  Non-tender. Musculoskeletal: Symmetrical with no gross deformities  Skin: No lesions on visible extremities Extremities: No edema  Neurological: Alert oriented x 4, grossly non-focal Psychological:  Alert and cooperative. Normal mood and affect  ASSESSMENT AND PLAN: *70 year old male with sudden onset painless/obstructive jaundice that was first noticed a couple of weeks ago.  Labs 10 days ago showed a total bilirubin of 18.5 and alk phos over 2000.  CT scan showed moderate to marked intrahepatic bile duct dilatation  with transition to normal caliber common bile duct. There is a ill-defined, infiltrative soft tissue within the convergence of the dilated intrahepatic bile ducts. The diagnosis of exclusion is cholangiocarcinoma.  Discussed with Dr. Ardis Hughs and Dr. Rush Landmark.  We are tentatively planning for ERCP with Dr. Ardis Hughs on Thursday, July 21 for decompression and brushings for cytology.  Prior to that we will obtain MRI abdomen/MRCP to better delineate the area.  I discussed with him that if he were to develop fevers, shaking chills, abdominal pain in the interim then he would need to proceed to the ER.  I also discussed with him that if ERCP is not successful to decompress his biliary tree then he would likely need admission to the hospital and be seen for percutaneous biliary drain with interventional radiology.  Will check CBC, CMP, PT/INR today. *Constipation:  Last good BM was a week ago, has not been eating or drinking much.  We discussed that drinking fluids can help with constipation.  I recommended that he use 2 fleets  enemas this evening, about an hour apart and drink a bottle of magnesium citrate to get things moving from both the top and the bottom.  Then he will begin drinking MiraLAX 1 capful twice daily mixed in 8 ounces of liquid.   CC:  Isaac Bliss, [place]*

## 2021-06-19 NOTE — Patient Instructions (Addendum)
If you are age 71 or older, your body mass index should be between 23-30. Your Body mass index is 20.95 kg/m. If this is out of the aforementioned range listed, please consider follow up with your Primary Care Provider. __________________________________________________________  The [facility] providers would like to encourage you to use [facility] to communicate with providers for non-urgent requests or questions.  Due to long hold times on the telephone, sending your provider a message by [facility] may be a faster and more efficient way to get a response.  Please allow 48 business hours for a response.  Please remember that this is for non-urgent requests.   You have been scheduled for an MRI at [facility] on 06/23/2021. Your appointment time is 12:00 pm. Please arrive to admitting (at main entrance of the hospital) 30 minutes prior to your appointment time for registration purposes. Please make certain not to have anything to eat or drink 6 hours prior to your test. In addition, if you have any metal in your body, have a pacemaker or defibrillator, please be sure to let your ordering physician know. This test typically takes 45 minutes to 1 hour to complete. Should you need to reschedule, please call [phone] to do so.  We have scheduled you for ERCP for 06/25/2021  Use 2 enemas this afternoon an hour apart. Drink a bottle of magnesium citrate this afternoon. Use 1 capful of Miralax twice daily in 8 ounces of water.  We will call you about results and a follow up.  Thank you for entrusting me with your care and choosing [facility].  Alonza Bogus, PA-C

## 2021-06-19 NOTE — Progress Notes (Signed)
Reviewed and agree with management plan which was developed with Dr. Ardis Hughs, Dr. Rush Landmark and me.   Pricilla Riffle. Fuller Plan, MD [facility]

## 2021-06-19 NOTE — Telephone Encounter (Signed)
-----   Message from Milus Banister, MD sent at 06/19/2021  7:18 AM EDT ----- If nobody else can do this sooner, I can go ahead with ERCP next Thursday (July 21st).   I do not consider myself expert at intrahepatic ERCP work but I think I should be able to decompress him and hopefully get adequate cytology as well.   ----- Message ----- From: Irving Copas., MD Sent: 06/19/2021   5:27 AM EDT To: Milus Banister, MD, Ladene Artist, MD, #  JZ, Interesting case. I have already scheduled outpatient urgent EUS/ERCPs every day, so I don't have any availability to add on another outpatient. If MS/DJ/CG/JP/RG have earlier availability or can help that is the only way that will happen next week as an outpatient unless he is admitted. I have a publication day and opened up a morning on 7/27 at [facility].  I think if he can tolerate the MRI/MRCP, it will better define whether we will be able to access both the left/right system to see what stenting may be possible.  Also it can help [place] understand the chance that this is already involving the vasculature (query PV invasion), where in which it would have to be noted that he would be at higher risk of bleeding as well. Let him know that whomever tries, if we cannot access, the biliary tree that he would then need to be admitted for PBD placement. Hope that helps and we can see if anyone else replies before you see him in clinic if they can help earlier, otherwise, you know where you can put him. If he elects to have the date I outlined, then he needs to be aware that any fevers/chills/progressive pain, will require him to come into the hospital. Thanks. GM  ----- Message ----- From: Loralie Champagne, PA-C Sent: 06/18/2021   2:54 PM EDT To: Irving Copas., MD  I am seeing this patient in clinic tomorrow.  I wanted you to look at his CT scan.  I had already talked to Dr. Fuller Plan and Dr. Ardis Hughs about him and they told me to speak with you.   My question was does he really require an MRI/MRCP or can we go straight to ERCP and if so when could we try to get this done.  We were just trying to be proactive since we knew he was coming in.  Obviously you will see that his bilirubin is newly elevated at 18.5.  Thank you,  Jess

## 2021-06-22 ENCOUNTER — Encounter (HOSPITAL_COMMUNITY): Payer: Self-pay

## 2021-06-22 ENCOUNTER — Inpatient Hospital Stay (HOSPITAL_COMMUNITY)
Admission: EM | Admit: 2021-06-22 | Discharge: 2021-06-26 | DRG: 445 | Disposition: A | Discharge status: Home Health | Payer: PPO | Attending: Family Medicine | Admitting: Family Medicine

## 2021-06-22 ENCOUNTER — Other Ambulatory Visit: Payer: Self-pay

## 2021-06-22 ENCOUNTER — Telehealth: Payer: Self-pay

## 2021-06-22 DIAGNOSIS — C240 Malignant neoplasm of extrahepatic bile duct: Secondary | ICD-10-CM

## 2021-06-22 DIAGNOSIS — Z888 Allergy status to other drugs, medicaments and biological substances status: Secondary | ICD-10-CM | POA: Diagnosis not present

## 2021-06-22 DIAGNOSIS — E871 Hypo-osmolality and hyponatremia: Secondary | ICD-10-CM

## 2021-06-22 DIAGNOSIS — C801 Malignant (primary) neoplasm, unspecified: Secondary | ICD-10-CM

## 2021-06-22 DIAGNOSIS — K831 Obstruction of bile duct: Secondary | ICD-10-CM | POA: Diagnosis not present

## 2021-06-22 DIAGNOSIS — R933 Abnormal findings on diagnostic imaging of other parts of digestive tract: Secondary | ICD-10-CM | POA: Diagnosis not present

## 2021-06-22 DIAGNOSIS — I10 Essential (primary) hypertension: Secondary | ICD-10-CM | POA: Diagnosis not present

## 2021-06-22 DIAGNOSIS — Z20822 Contact with and (suspected) exposure to covid-19: Secondary | ICD-10-CM | POA: Diagnosis not present

## 2021-06-22 DIAGNOSIS — Z4659 Encounter for fitting and adjustment of other gastrointestinal appliance and device: Secondary | ICD-10-CM | POA: Diagnosis not present

## 2021-06-22 DIAGNOSIS — K2289 Other specified disease of esophagus: Secondary | ICD-10-CM | POA: Diagnosis not present

## 2021-06-22 DIAGNOSIS — I69331 Monoplegia of upper limb following cerebral infarction affecting right dominant side: Secondary | ICD-10-CM

## 2021-06-22 DIAGNOSIS — K7689 Other specified diseases of liver: Secondary | ICD-10-CM | POA: Diagnosis not present

## 2021-06-22 DIAGNOSIS — D72829 Elevated white blood cell count, unspecified: Secondary | ICD-10-CM | POA: Diagnosis not present

## 2021-06-22 DIAGNOSIS — K2950 Unspecified chronic gastritis without bleeding: Secondary | ICD-10-CM | POA: Diagnosis present

## 2021-06-22 DIAGNOSIS — Z79899 Other long term (current) drug therapy: Secondary | ICD-10-CM

## 2021-06-22 DIAGNOSIS — G8194 Hemiplegia, unspecified affecting left nondominant side: Secondary | ICD-10-CM | POA: Diagnosis not present

## 2021-06-22 DIAGNOSIS — Z7982 Long term (current) use of aspirin: Secondary | ICD-10-CM

## 2021-06-22 DIAGNOSIS — C221 Intrahepatic bile duct carcinoma: Secondary | ICD-10-CM | POA: Diagnosis present

## 2021-06-22 DIAGNOSIS — Z6820 Body mass index (BMI) 20.0-20.9, adult: Secondary | ICD-10-CM | POA: Diagnosis not present

## 2021-06-22 DIAGNOSIS — E876 Hypokalemia: Secondary | ICD-10-CM | POA: Diagnosis present

## 2021-06-22 DIAGNOSIS — Z96643 Presence of artificial hip joint, bilateral: Secondary | ICD-10-CM | POA: Diagnosis not present

## 2021-06-22 DIAGNOSIS — R17 Unspecified jaundice: Secondary | ICD-10-CM | POA: Diagnosis not present

## 2021-06-22 DIAGNOSIS — E785 Hyperlipidemia, unspecified: Secondary | ICD-10-CM | POA: Diagnosis present

## 2021-06-22 DIAGNOSIS — R945 Abnormal results of liver function studies: Secondary | ICD-10-CM | POA: Diagnosis not present

## 2021-06-22 DIAGNOSIS — R932 Abnormal findings on diagnostic imaging of liver and biliary tract: Secondary | ICD-10-CM | POA: Diagnosis not present

## 2021-06-22 DIAGNOSIS — Z8546 Personal history of malignant neoplasm of prostate: Secondary | ICD-10-CM

## 2021-06-22 DIAGNOSIS — F32A Depression, unspecified: Secondary | ICD-10-CM | POA: Diagnosis not present

## 2021-06-22 DIAGNOSIS — K820 Obstruction of gallbladder: Secondary | ICD-10-CM | POA: Diagnosis not present

## 2021-06-22 DIAGNOSIS — Z87891 Personal history of nicotine dependence: Secondary | ICD-10-CM

## 2021-06-22 DIAGNOSIS — E872 Acidosis: Secondary | ICD-10-CM | POA: Diagnosis present

## 2021-06-22 DIAGNOSIS — D490 Neoplasm of unspecified behavior of digestive system: Secondary | ICD-10-CM | POA: Diagnosis not present

## 2021-06-22 DIAGNOSIS — Z800 Family history of malignant neoplasm of digestive organs: Secondary | ICD-10-CM

## 2021-06-22 DIAGNOSIS — R972 Elevated prostate specific antigen [PSA]: Secondary | ICD-10-CM | POA: Diagnosis not present

## 2021-06-22 DIAGNOSIS — E222 Syndrome of inappropriate secretion of antidiuretic hormone: Secondary | ICD-10-CM | POA: Diagnosis not present

## 2021-06-22 DIAGNOSIS — Z882 Allergy status to sulfonamides status: Secondary | ICD-10-CM | POA: Diagnosis not present

## 2021-06-22 DIAGNOSIS — E440 Moderate protein-calorie malnutrition: Secondary | ICD-10-CM | POA: Insufficient documentation

## 2021-06-22 DIAGNOSIS — R911 Solitary pulmonary nodule: Secondary | ICD-10-CM | POA: Diagnosis not present

## 2021-06-22 DIAGNOSIS — G4700 Insomnia, unspecified: Secondary | ICD-10-CM | POA: Diagnosis not present

## 2021-06-22 DIAGNOSIS — Z9889 Other specified postprocedural states: Secondary | ICD-10-CM | POA: Diagnosis not present

## 2021-06-22 DIAGNOSIS — I63519 Cerebral infarction due to unspecified occlusion or stenosis of unspecified middle cerebral artery: Secondary | ICD-10-CM | POA: Diagnosis not present

## 2021-06-22 DIAGNOSIS — I639 Cerebral infarction, unspecified: Secondary | ICD-10-CM | POA: Diagnosis present

## 2021-06-22 DIAGNOSIS — Z8249 Family history of ischemic heart disease and other diseases of the circulatory system: Secondary | ICD-10-CM

## 2021-06-22 DIAGNOSIS — I700 Atherosclerosis of aorta: Secondary | ICD-10-CM | POA: Diagnosis not present

## 2021-06-22 DIAGNOSIS — K2970 Gastritis, unspecified, without bleeding: Secondary | ICD-10-CM | POA: Diagnosis not present

## 2021-06-22 LAB — CBC WITH DIFFERENTIAL/PLATELET
Abs Immature Granulocytes: 0.07 10*3/uL (ref 0.00–0.07)
Basophils Absolute: 0 10*3/uL (ref 0.0–0.1)
Basophils Relative: 0 %
Eosinophils Absolute: 0 10*3/uL (ref 0.0–0.5)
Eosinophils Relative: 0 %
HCT: 33.5 % — ABNORMAL LOW (ref 39.0–52.0)
Hemoglobin: 11.3 g/dL — ABNORMAL LOW (ref 13.0–17.0)
Immature Granulocytes: 1 %
Lymphocytes Relative: 3 %
Lymphs Abs: 0.5 10*3/uL — ABNORMAL LOW (ref 0.7–4.0)
MCH: 29.7 pg (ref 26.0–34.0)
MCHC: 33.7 g/dL (ref 30.0–36.0)
MCV: 87.9 fL (ref 80.0–100.0)
Monocytes Absolute: 1.1 10*3/uL — ABNORMAL HIGH (ref 0.1–1.0)
Monocytes Relative: 8 %
Neutro Abs: 12.6 10*3/uL — ABNORMAL HIGH (ref 1.7–7.7)
Neutrophils Relative %: 88 %
Platelets: 562 10*3/uL — ABNORMAL HIGH (ref 150–400)
RBC: 3.81 MIL/uL — ABNORMAL LOW (ref 4.22–5.81)
RDW: 16.9 % — ABNORMAL HIGH (ref 11.5–15.5)
WBC: 14.3 10*3/uL — ABNORMAL HIGH (ref 4.0–10.5)
nRBC: 0 % (ref 0.0–0.2)

## 2021-06-22 LAB — COMPREHENSIVE METABOLIC PANEL
ALT: 436 U/L — ABNORMAL HIGH (ref 0–44)
AST: 420 U/L — ABNORMAL HIGH (ref 15–41)
Albumin: 2.6 g/dL — ABNORMAL LOW (ref 3.5–5.0)
Alkaline Phosphatase: 2274 U/L — ABNORMAL HIGH (ref 38–126)
Anion gap: 12 (ref 5–15)
BUN: 27 mg/dL — ABNORMAL HIGH (ref 8–23)
CO2: 25 mmol/L (ref 22–32)
Calcium: 9.5 mg/dL (ref 8.9–10.3)
Chloride: 90 mmol/L — ABNORMAL LOW (ref 98–111)
Creatinine, Ser: 0.57 mg/dL — ABNORMAL LOW (ref 0.61–1.24)
GFR, Estimated: >60 mL/min (ref >60)
Glucose, Bld: 85 mg/dL (ref 70–99)
Potassium: 3.1 mmol/L — ABNORMAL LOW (ref 3.5–5.1)
Sodium: 127 mmol/L — ABNORMAL LOW (ref 135–145)
Total Bilirubin: 28.1 mg/dL (ref 0.3–1.2)
Total Protein: 7.8 g/dL (ref 6.5–8.1)

## 2021-06-22 LAB — URINALYSIS, ROUTINE W REFLEX MICROSCOPIC
Glucose, UA: NEGATIVE mg/dL
Ketones, ur: 5 mg/dL — AB
Leukocytes,Ua: NEGATIVE
Nitrite: NEGATIVE
Protein, ur: 30 mg/dL — AB
Specific Gravity, Urine: 1.023 (ref 1.005–1.030)
pH: 6 (ref 5.0–8.0)

## 2021-06-22 LAB — RESP PANEL BY RT-PCR (FLU A&B, COVID) ARPGX2
Influenza A by PCR: NEGATIVE
Influenza B by PCR: NEGATIVE
SARS Coronavirus 2 by RT PCR: NEGATIVE

## 2021-06-22 LAB — LIPASE, BLOOD: Lipase: 40 U/L (ref 11–51)

## 2021-06-22 LAB — MAGNESIUM: Magnesium: 2.8 mg/dL — ABNORMAL HIGH (ref 1.7–2.4)

## 2021-06-22 LAB — PROTIME-INR
INR: 1.8 — ABNORMAL HIGH (ref 0.8–1.2)
Prothrombin Time: 20.9 seconds — ABNORMAL HIGH (ref 11.4–15.2)

## 2021-06-22 LAB — ETHANOL: Alcohol, Ethyl (B): <10 mg/dL (ref <10)

## 2021-06-22 LAB — LACTIC ACID, PLASMA: Lactic Acid, Venous: 0.8 mmol/L (ref 0.5–1.9)

## 2021-06-22 LAB — AMMONIA: Ammonia: 65 umol/L — ABNORMAL HIGH (ref 9–35)

## 2021-06-22 LAB — ACETAMINOPHEN LEVEL: Acetaminophen (Tylenol), Serum: <10 ug/mL — ABNORMAL LOW (ref 10–30)

## 2021-06-22 MED ORDER — CEFEPIME HCL 2 G IJ SOLR
2.0000 g | Freq: Three times a day (TID) | INTRAMUSCULAR | Status: AC
Start: 2021-06-22 — End: 2021-06-25
  Administered 2021-06-22 – 2021-06-25 (×10): 2 g via INTRAVENOUS
  Filled 2021-06-22 (×10): qty 2

## 2021-06-22 MED ORDER — METRONIDAZOLE 500 MG/100ML IV SOLN
500.0000 mg | Freq: Three times a day (TID) | INTRAVENOUS | Status: DC
  Administered 2021-06-23 – 2021-06-26 (×8): 500 mg via INTRAVENOUS
  Filled 2021-06-22 (×8): qty 100

## 2021-06-22 MED ORDER — SENNOSIDES-DOCUSATE SODIUM 8.6-50 MG PO TABS
2.0000 | ORAL_TABLET | Freq: Every day | ORAL | Status: DC
  Administered 2021-06-23 – 2021-06-26 (×4): 2 via ORAL
  Filled 2021-06-22 (×4): qty 2

## 2021-06-22 MED ORDER — METRONIDAZOLE 500 MG/100ML IV SOLN
500.0000 mg | Freq: Once | INTRAVENOUS | Status: AC
  Administered 2021-06-22: 500 mg via INTRAVENOUS
  Filled 2021-06-22: qty 100

## 2021-06-22 MED ORDER — SODIUM CHLORIDE 0.9 % IV SOLN
2.0000 g | Freq: Once | INTRAVENOUS | Status: AC
  Administered 2021-06-22: 2 g via INTRAVENOUS
  Filled 2021-06-22: qty 2

## 2021-06-22 MED ORDER — PANTOPRAZOLE SODIUM 40 MG PO TBEC
40.0000 mg | DELAYED_RELEASE_TABLET | Freq: Every day | ORAL | Status: DC
  Administered 2021-06-22 – 2021-06-26 (×5): 40 mg via ORAL
  Filled 2021-06-22 (×5): qty 1

## 2021-06-22 MED ORDER — SODIUM CHLORIDE 0.9 % IV BOLUS
1000.0000 mL | Freq: Once | INTRAVENOUS | Status: AC
  Administered 2021-06-22: 1000 mL via INTRAVENOUS

## 2021-06-22 MED ORDER — ENOXAPARIN SODIUM 40 MG/0.4ML IJ SOSY
40.0000 mg | PREFILLED_SYRINGE | INTRAMUSCULAR | Status: DC
  Administered 2021-06-22: 40 mg via SUBCUTANEOUS
  Filled 2021-06-22: qty 0.4

## 2021-06-22 MED ORDER — POTASSIUM CHLORIDE IN NACL 20-0.9 MEQ/L-% IV SOLN
INTRAVENOUS | Status: DC
  Filled 2021-06-22 (×4): qty 1000

## 2021-06-22 MED ORDER — TRAZODONE HCL 100 MG PO TABS
100.0000 mg | ORAL_TABLET | Freq: Every day | ORAL | Status: DC
  Administered 2021-06-22 – 2021-06-25 (×4): 100 mg via ORAL
  Filled 2021-06-22 (×4): qty 1

## 2021-06-22 MED ORDER — ASPIRIN EC 325 MG PO TBEC
325.0000 mg | DELAYED_RELEASE_TABLET | Freq: Every day | ORAL | Status: DC
  Administered 2021-06-22 – 2021-06-24 (×3): 325 mg via ORAL
  Filled 2021-06-22 (×3): qty 1

## 2021-06-22 MED ORDER — BUPROPION HCL ER (XL) 150 MG PO TB24
150.0000 mg | ORAL_TABLET | Freq: Every day | ORAL | Status: DC
  Administered 2021-06-22 – 2021-06-26 (×5): 150 mg via ORAL
  Filled 2021-06-22 (×5): qty 1

## 2021-06-22 NOTE — Consult Note (Signed)
Consultation  Referring Provider: Dr. Melina Copa     Primary Care Physician:  Isaac Bliss, Rayford Halsted, MD Primary Gastroenterologist:   Dr. Fuller Plan      Reason for Consultation:   Painless jaundice, increasing LFTs, leukocytosis         HPI:   Ruben Chavez is a 70 y.o. male with a past medical history as listed below, who was sent to the ED for "abnormal labs and jaundice".  Patient is familiar to our service and recently seen in our outpatient clinic.    06/19/2021 patient seen in our outpatient office by Alonza Bogus, PA for jaundice and weight loss.  At that time discussed that they had started noticing the jaundice about 2 weeks ago and his weight had dropped around 28 pounds over the past year.  Labs performed 10 days prior showed a bili of 18.5, alk phos over 2000, AST and ALT in the 500s.  5 months ago LFTs were completely normal.  CT scan of the abdomen pelvis with and without contrast showed moderate to marked intrahepatic bile duct dilation with transition to normal caliber common bile duct, ill-defined infiltrative soft tissue within the convergence of the dilated intrahepatic bile ducts, diagnosis of exclusion is cholangiocarcinoma.  MRCP recommended.  No evidence for nodal metastasis or solid organ metastasis within the abdomen or pelvis.  Nonobstructing right renal calculus and aortic atherosclerosis.  On that time he complained of lot of constipation and that his stools were clay colored.  He had not been eating or drinking very much and was taking 2 senna every night.  Denied fever, chills or abdominal pain.  At that time plan was for MRCP within the next few days and ERCP on Thursday, July 21 for decompression and brushings for cytology.  He was told to come to the ER if you develop fevers, shaking chills or abdominal pain.  He was told to use 2 fleets enemas that night about an hour apart and drink a bottle of mag citrate to get things moving and then start MiraLAX 1 cap twice  daily.    Today, the patient describes that he has had a months worth of jaundice, weight loss, fatigue, poor appetite and light-colored stools as well as dark-colored urine.  Since being seen in the clinic on Friday his wife tells me that he has gotten even weaker though he seems to disagree stating that he has just been "generally weak over the past week".  He has not been having much of an appetite.  He tried to do the mag citrate cleanse but after drinking some of it he vomited.  Does tell me though that he had a bowel movement once daily over the past couple of days with daily Miralax, this morning they did not do his normal MiraLAX because he did not want to take it before coming to the ER.            Denies any abdominal pain, fever or chills.  ER course: WBC 14.3, hemoglobin 11.3, platelets 562, CMP pending (CMP 57/15/22 with an AST 417, ALT 476, total bili 28.8, alk phos 2495, potassium 3.  GI history: 12/25/2015 colonoscopy with 3 sessile polyps in the sigmoid colon, sessile polyp in transverse colon and grade 1 internal hemorrhoids, repeat recommended in 5 years  Past Medical History:  Diagnosis Date   Elevated PSA    Hyperlipidemia    pt says not anymore   Hypertension    Prostate cancer (  [place]) 2013    Past Surgical History:  Procedure Laterality Date   bilateral inguinal hernia  2008   BUBBLE STUDY  01/28/2020   Procedure: BUBBLE STUDY;  Surgeon: [name], MD;  Location: [place];  Service: Cardiovascular;;   CYST REMOVAL TRUNK  2016   sebaceous cyst   CYSTOSCOPY  04/22/2016   Procedure: CYSTOSCOPY;  Surgeon: [name], MD;  Location: [place];  Service: Urology;;   IR ANGIO INTRA EXTRACRAN SEL COM CAROTID INNOMINATE BILAT MOD SED  05/30/2020   IR ANGIO VERTEBRAL SEL SUBCLAVIAN INNOMINATE UNI R MOD SED  01/25/2020   IR ANGIO VERTEBRAL SEL VERTEBRAL BILAT MOD SED  05/30/2020   IR CT HEAD LTD  01/25/2020   IR PERCUTANEOUS ART  THROMBECTOMY/INFUSION INTRACRANIAL INC DIAG ANGIO  01/25/2020   IR US GUIDE VASC ACCESS RIGHT  05/30/2020   KNEE ARTHROSCOPY  2007   right   LOOP RECORDER INSERTION N/A 01/28/2020   Procedure: LOOP RECORDER INSERTION;  Surgeon: [name], MD;  Location: [place] [facility];  Service: Cardiovascular;  Laterality: N/A;   PROSTATE BIOPSY  2008   PROSTATE BIOPSY  2013   PROSTATE BIOPSY  01/28/2016   RADIOACTIVE SEED IMPLANT N/A 04/22/2016   Procedure: RADIOACTIVE SEED IMPLANT/BRACHYTHERAPY IMPLANT;  Surgeon: [name], MD;  Location: [facility];  Service: Urology;  Laterality: N/A;   RADIOLOGY WITH ANESTHESIA N/A 01/24/2020   Procedure: IR WITH ANESTHESIA;  Surgeon: [name], MD;  Location: [place];  Service: Radiology;  Laterality: N/A;   TEE WITHOUT CARDIOVERSION N/A 01/28/2020   Procedure: TRANSESOPHAGEAL ECHOCARDIOGRAM (TEE);  Surgeon: [name], MD;  Location: [facility] ENDOSCOPY;  Service: Cardiovascular;  Laterality: N/A;   TONSILLECTOMY     TOTAL HIP ARTHROPLASTY  2001   left   TOTAL HIP ARTHROPLASTY Right 01/18/2018   Procedure: RIGHT TOTAL HIP ARTHROPLASTY ANTERIOR APPROACH;  Surgeon: [name], MD;  Location: [facility];  Service: Orthopedics;  Laterality: Right;    Family History  Problem Relation Age of Onset   Pancreatic cancer Mother        small bowel cancer   Heart disease Father    Colon cancer Neg Hx    Stomach cancer Neg Hx     Social History   Tobacco Use   Smoking status: Former    Packs/day: 1.00    Years: 32.00    Pack years: 32.00    Types: Cigarettes    Quit date: 06/06/1999    Years since quitting: 22.0   Smokeless tobacco: Never  Vaping Use   Vaping Use: Never used  Substance Use Topics   Alcohol use: Not Currently    Alcohol/week: 14.0 standard drinks    Types: 72 Shots of liquor per week   Drug use: No    Prior to Admission medications   Medication Sig Start Date End Date Taking? Authorizing Provider  aspirin EC  325 MG EC tablet Take 1 tablet (325 mg total) by mouth daily. 01/29/20   [name], NP  atorvastatin (LIPITOR) 20 MG tablet TAKE 1 TABLET(20 MG) BY MOUTH DAILY AT 6 PM 02/05/21   Isaac Bliss, Rayford Halsted, MD  buPROPion (WELLBUTRIN XL) 300 MG 24 hr tablet TAKE 1 TABLET(300 MG) BY MOUTH DAILY 02/05/21   Isaac Bliss, Rayford Halsted, MD  folic acid (FOLVITE) 1 MG tablet TAKE 1 TABLET(1 MG) BY MOUTH DAILY 02/05/21   Isaac Bliss, Rayford Halsted, MD  lisinopril-hydrochlorothiazide (ZESTORETIC) 20-25 MG tablet TAKE  1 TABLET BY MOUTH DAILY 05/06/21   Isaac Bliss, Rayford Halsted, MD  Magnesium 250 MG TABS Take 250 mg by mouth daily.     [provider]  Multiple Vitamin (MULTIVITAMIN WITH MINERALS) TABS tablet Take 1 tablet by mouth daily. 01/29/20   [name], NP  pantoprazole (PROTONIX) 40 MG tablet Take 1 tablet (40 mg total) by mouth daily. 06/04/21   Isaac Bliss, Rayford Halsted, MD  senna-docusate (SENOKOT-S) 8.6-50 MG tablet Take 2 tablets by mouth daily. 02/13/20   Angiulli, Lavon Paganini, PA-C  traZODone (DESYREL) 100 MG tablet TAKE 1 TABLET(100 MG) BY MOUTH AT BEDTIME AS NEEDED FOR SLEEP 03/03/21   Isaac Bliss, Rayford Halsted, MD  triamcinolone (KENALOG) 0.1 % Apply 1 application topically 2 (two) times daily. 03/05/21   Isaac Bliss, Rayford Halsted, MD    No current facility-administered medications for this encounter.   Current Outpatient Medications  Medication Sig Dispense Refill   aspirin EC 325 MG EC tablet Take 1 tablet (325 mg total) by mouth daily. 30 tablet 0   atorvastatin (LIPITOR) 20 MG tablet TAKE 1 TABLET(20 MG) BY MOUTH DAILY AT 6 PM 90 tablet 1   buPROPion (WELLBUTRIN XL) 300 MG 24 hr tablet TAKE 1 TABLET(300 MG) BY MOUTH DAILY 90 tablet 1   folic acid (FOLVITE) 1 MG tablet TAKE 1 TABLET(1 MG) BY MOUTH DAILY 90 tablet 1   lisinopril-hydrochlorothiazide (ZESTORETIC) 20-25 MG tablet TAKE 1 TABLET BY MOUTH DAILY 90 tablet 1   Magnesium 250 MG TABS Take 250 mg by mouth daily.       Multiple Vitamin (MULTIVITAMIN WITH MINERALS) TABS tablet Take 1 tablet by mouth daily.     pantoprazole (PROTONIX) 40 MG tablet Take 1 tablet (40 mg total) by mouth daily. 90 tablet 1   senna-docusate (SENOKOT-S) 8.6-50 MG tablet Take 2 tablets by mouth daily.     traZODone (DESYREL) 100 MG tablet TAKE 1 TABLET(100 MG) BY MOUTH AT BEDTIME AS NEEDED FOR SLEEP 90 tablet 1   triamcinolone (KENALOG) 0.1 % Apply 1 application topically 2 (two) times daily. 30 g 0    Allergies as of 06/22/2021 - Review Complete 06/22/2021  Allergen Reaction Noted   Sulfa antibiotics Swelling and Other (See Comments) 01/18/2018   Advil [ibuprofen] Other (See Comments) 01/24/2020   Aleve [naproxen] Other (See Comments) 01/24/2020   Betadine [povidone iodine] Itching and Rash 01/18/2018   Chloroxylenol (antiseptic) Itching, Rash, and Other (See Comments) 01/03/2018   Poison ivy extract Rash 01/18/2018   Povidone-iodine Itching and Rash 03/09/2010     Review of Systems:    Constitutional: No fever or chills Skin: No rash  Cardiovascular: No chest pain Respiratory: No SOB Gastrointestinal: See HPI and otherwise negative Genitourinary: No dysuria  Neurological: No headache, dizziness or syncope Musculoskeletal: No new muscle or joint pain Hematologic: No bleeding Psychiatric: No history of depression or anxiety    Physical Exam:  Vital signs in last 24 hours: Temp:  [98.5 F (36.9 C)] 98.5 F (36.9 C) (07/18 1140) Pulse Rate:  [71-83] 71 (07/18 1330) Resp:  [16-20] 20 (07/18 1330) BP: (106-116)/(57-68) 107/57 (07/18 1330) SpO2:  [98 %-100 %] 99 % (07/18 1330) Weight:  [65.3 kg] 65.3 kg (07/18 1204)   General:   Pleasant Jaundiced Caucasian male appears to be in NAD, Well developed, Well nourished, alert and cooperative Head:  Normocephalic and atraumatic. Eyes:   PEERL, EOMI. No icterus. Conjunctiva pink. Ears:  Normal auditory acuity. Neck:  Supple Throat: Oral cavity  and pharynx without  inflammation, swelling or lesion.  Lungs: Respirations even and unlabored. Lungs clear to auscultation bilaterally.   No wheezes, crackles, or rhonchi.  Heart: Normal S1, S2. No MRG. Regular rate and rhythm. No peripheral edema, cyanosis or pallor.  Abdomen:  Soft, nondistended, nontender. No rebound or guarding. Normal bowel sounds. No appreciable masses or hepatomegaly. Rectal:  Not performed.  Msk:  Symmetrical without gross deformities. Peripheral pulses intact.  Extremities:  Without edema, no deformity or joint abnormality. Neurologic:  Alert and  oriented x4;  grossly normal neurologically.   Skin:   Dry and intact without significant lesions or rashes. Psychiatric: Demonstrates good judgement and reason without abnormal affect or behaviors. Impaired short term memory   LAB RESULTS: Recent Labs    06/19/21 1425 06/22/21 1312  WBC 15.7* 14.3*  HGB 10.6* 11.3*  HCT 31.0* 33.5*  PLT 488.0* 562*   BMET Recent Labs    06/19/21 1425  NA 124*  K 3.0*  CL 90*  CO2 25  GLUCOSE 110*  BUN 24*  CREATININE 1.18  CALCIUM 9.0   Hepatic Function Latest Ref Rng & Units 06/19/2021 06/09/2021 12/24/2020  Total Protein 6.0 - 8.3 g/dL 6.5 6.4 7.6  Albumin 3.5 - 5.2 g/dL 3.1(L) 3.4(L) 4.7  AST 0 - 37 U/L 417(H) 592(H) 18  ALT 0 - 53 U/L 476(H) 537(H) 18  Alk Phosphatase 39 - 117 U/L 2,495(H) 2,387(H) 57  Total Bilirubin 0.2 - 1.2 mg/dL 28.8(H) 18.5(H) 0.9  Bilirubin, Direct 0.0 - 0.3 mg/dL - 10.2(H) -     PT/INR Recent Labs    06/19/21 1425  LABPROT 18.5*  INR 1.7*     Impression / Plan:   Impression: 1.  Elevated LFTs: See HPI and labs above,with imaging questioning cholangiocarcinoma 2.  Painless jaundice: With above 3.  Leukocytosis: Concern for ascending cholangitis 4.  Hypokalemia  Plan: 1.  Agree with MRCP, will reorder today. 2.  Plan will be to proceed with ERCP likely on 06/24/2021 with Dr. Rush Landmark for decompression and bile duct brushings for cytology.  Did  review risks, benefits, limitations and alternatives the patient today and he agrees to proceed. 3.  Continue to monitor daily LFTs 4.  Agree with supportive measures including correcting his hypokalemia prior to procedure scheduled for Wednesday 5.  Patient can be on a regular diet today and tomorrow and will be n.p.o. at midnight on 7/19 6.  Would recommend starting antibiotics given leukocytosis  Thank you for your kind consultation, we will continue to follow.  Lavone Nian Rema Lievanos  06/22/2021, 1:51 PM

## 2021-06-22 NOTE — H&P (View-Only) (Signed)
Consultation  Referring Provider: Dr. Melina Copa     Primary Care Physician:  Isaac Bliss, Rayford Halsted, MD Primary Gastroenterologist:   Dr. Fuller Plan      Reason for Consultation:   Painless jaundice, increasing LFTs, leukocytosis         HPI:   Ruben Chavez is a 70 y.o. male with a past medical history as listed below, who was sent to the ED for "abnormal labs and jaundice".  Patient is familiar to our service and recently seen in our outpatient clinic.    06/19/2021 patient seen in our outpatient office by Alonza Bogus, PA for jaundice and weight loss.  At that time discussed that they had started noticing the jaundice about 2 weeks ago and his weight had dropped around 28 pounds over the past year.  Labs performed 10 days prior showed a bili of 18.5, alk phos over 2000, AST and ALT in the 500s.  5 months ago LFTs were completely normal.  CT scan of the abdomen pelvis with and without contrast showed moderate to marked intrahepatic bile duct dilation with transition to normal caliber common bile duct, ill-defined infiltrative soft tissue within the convergence of the dilated intrahepatic bile ducts, diagnosis of exclusion is cholangiocarcinoma.  MRCP recommended.  No evidence for nodal metastasis or solid organ metastasis within the abdomen or pelvis.  Nonobstructing right renal calculus and aortic atherosclerosis.  On that time he complained of lot of constipation and that his stools were clay colored.  He had not been eating or drinking very much and was taking 2 senna every night.  Denied fever, chills or abdominal pain.  At that time plan was for MRCP within the next few days and ERCP on Thursday, July 21 for decompression and brushings for cytology.  He was told to come to the ER if you develop fevers, shaking chills or abdominal pain.  He was told to use 2 fleets enemas that night about an hour apart and drink a bottle of mag citrate to get things moving and then start MiraLAX 1 cap twice  daily.    Today, the patient describes that he has had a months worth of jaundice, weight loss, fatigue, poor appetite and light-colored stools as well as dark-colored urine.  Since being seen in the clinic on Friday his wife tells me that he has gotten even weaker though he seems to disagree stating that he has just been "generally weak over the past week".  He has not been having much of an appetite.  He tried to do the mag citrate cleanse but after drinking some of it he vomited.  Does tell me though that he had a bowel movement once daily over the past couple of days with daily Miralax, this morning they did not do his normal MiraLAX because he did not want to take it before coming to the ER.            Denies any abdominal pain, fever or chills.  ER course: WBC 14.3, hemoglobin 11.3, platelets 562, CMP pending (CMP 57/15/22 with an AST 417, ALT 476, total bili 28.8, alk phos 2495, potassium 3.  GI history: 12/25/2015 colonoscopy with 3 sessile polyps in the sigmoid colon, sessile polyp in transverse colon and grade 1 internal hemorrhoids, repeat recommended in 5 years  Past Medical History:  Diagnosis Date   Elevated PSA    Hyperlipidemia    pt says not anymore   Hypertension    Prostate cancer (  [place]) 2013    Past Surgical History:  Procedure Laterality Date   bilateral inguinal hernia  2008   BUBBLE STUDY  01/28/2020   Procedure: BUBBLE STUDY;  Surgeon: [name], MD;  Location: [place];  Service: Cardiovascular;;   CYST REMOVAL TRUNK  2016   sebaceous cyst   CYSTOSCOPY  04/22/2016   Procedure: CYSTOSCOPY;  Surgeon: [name], MD;  Location: [facility];  Service: Urology;;   IR ANGIO INTRA EXTRACRAN SEL COM CAROTID INNOMINATE BILAT MOD SED  05/30/2020   IR ANGIO VERTEBRAL SEL SUBCLAVIAN INNOMINATE UNI R MOD SED  01/25/2020   IR ANGIO VERTEBRAL SEL VERTEBRAL BILAT MOD SED  05/30/2020   IR CT HEAD LTD  01/25/2020   IR PERCUTANEOUS ART  THROMBECTOMY/INFUSION INTRACRANIAL INC DIAG ANGIO  01/25/2020   IR US GUIDE VASC ACCESS RIGHT  05/30/2020   KNEE ARTHROSCOPY  2007   right   LOOP RECORDER INSERTION N/A 01/28/2020   Procedure: LOOP RECORDER INSERTION;  Surgeon: [name], MD;  Location: [facility];  Service: Cardiovascular;  Laterality: N/A;   PROSTATE BIOPSY  2008   PROSTATE BIOPSY  2013   PROSTATE BIOPSY  01/28/2016   RADIOACTIVE SEED IMPLANT N/A 04/22/2016   Procedure: RADIOACTIVE SEED IMPLANT/BRACHYTHERAPY IMPLANT;  Surgeon: [name], MD;  Location: [facility];  Service: Urology;  Laterality: N/A;   RADIOLOGY WITH ANESTHESIA N/A 01/24/2020   Procedure: IR WITH ANESTHESIA;  Surgeon: [name], MD;  Location: [place];  Service: Radiology;  Laterality: N/A;   TEE WITHOUT CARDIOVERSION N/A 01/28/2020   Procedure: TRANSESOPHAGEAL ECHOCARDIOGRAM (TEE);  Surgeon: [name], MD;  Location: [facility]OSCOPY;  Service: Cardiovascular;  Laterality: N/A;   TONSILLECTOMY     TOTAL HIP ARTHROPLASTY  2001   left   TOTAL HIP ARTHROPLASTY Right 01/18/2018   Procedure: RIGHT TOTAL HIP ARTHROPLASTY ANTERIOR APPROACH;  Surgeon: [name], MD;  Location: [facility];  Service: Orthopedics;  Laterality: Right;    Family History  Problem Relation Age of Onset   Pancreatic cancer Mother        small bowel cancer   Heart disease Father    Colon cancer Neg Hx    Stomach cancer Neg Hx     Social History   Tobacco Use   Smoking status: Former    Packs/day: 1.00    Years: 32.00    Pack years: 32.00    Types: Cigarettes    Quit date: 06/06/1999    Years since quitting: 22.0   Smokeless tobacco: Never  Vaping Use   Vaping Use: Never used  Substance Use Topics   Alcohol use: Not Currently    Alcohol/week: 14.0 standard drinks    Types: 72 Shots of liquor per week   Drug use: No    Prior to Admission medications   Medication Sig Start Date End Date Taking? Authorizing Provider  aspirin EC  325 MG EC tablet Take 1 tablet (325 mg total) by mouth daily. 01/29/20   [name], NP  atorvastatin (LIPITOR) 20 MG tablet TAKE 1 TABLET(20 MG) BY MOUTH DAILY AT 6 PM 02/05/21   Isaac Bliss, Rayford Halsted, MD  buPROPion (WELLBUTRIN XL) 300 MG 24 hr tablet TAKE 1 TABLET(300 MG) BY MOUTH DAILY 02/05/21   Isaac Bliss, Rayford Halsted, MD  folic acid (FOLVITE) 1 MG tablet TAKE 1 TABLET(1 MG) BY MOUTH DAILY 02/05/21   Isaac Bliss, Rayford Halsted, MD  lisinopril-hydrochlorothiazide (ZESTORETIC) 20-25 MG tablet TAKE  1 TABLET BY MOUTH DAILY 05/06/21   Isaac Bliss, Rayford Halsted, MD  Magnesium 250 MG TABS Take 250 mg by mouth daily.     [provider]  Multiple Vitamin (MULTIVITAMIN WITH MINERALS) TABS tablet Take 1 tablet by mouth daily. 01/29/20   [name], NP  pantoprazole (PROTONIX) 40 MG tablet Take 1 tablet (40 mg total) by mouth daily. 06/04/21   Isaac Bliss, Rayford Halsted, MD  senna-docusate (SENOKOT-S) 8.6-50 MG tablet Take 2 tablets by mouth daily. 02/13/20   Angiulli, Lavon Paganini, PA-C  traZODone (DESYREL) 100 MG tablet TAKE 1 TABLET(100 MG) BY MOUTH AT BEDTIME AS NEEDED FOR SLEEP 03/03/21   Isaac Bliss, Rayford Halsted, MD  triamcinolone (KENALOG) 0.1 % Apply 1 application topically 2 (two) times daily. 03/05/21   Isaac Bliss, Rayford Halsted, MD    No current facility-administered medications for this encounter.   Current Outpatient Medications  Medication Sig Dispense Refill   aspirin EC 325 MG EC tablet Take 1 tablet (325 mg total) by mouth daily. 30 tablet 0   atorvastatin (LIPITOR) 20 MG tablet TAKE 1 TABLET(20 MG) BY MOUTH DAILY AT 6 PM 90 tablet 1   buPROPion (WELLBUTRIN XL) 300 MG 24 hr tablet TAKE 1 TABLET(300 MG) BY MOUTH DAILY 90 tablet 1   folic acid (FOLVITE) 1 MG tablet TAKE 1 TABLET(1 MG) BY MOUTH DAILY 90 tablet 1   lisinopril-hydrochlorothiazide (ZESTORETIC) 20-25 MG tablet TAKE 1 TABLET BY MOUTH DAILY 90 tablet 1   Magnesium 250 MG TABS Take 250 mg by mouth daily.       Multiple Vitamin (MULTIVITAMIN WITH MINERALS) TABS tablet Take 1 tablet by mouth daily.     pantoprazole (PROTONIX) 40 MG tablet Take 1 tablet (40 mg total) by mouth daily. 90 tablet 1   senna-docusate (SENOKOT-S) 8.6-50 MG tablet Take 2 tablets by mouth daily.     traZODone (DESYREL) 100 MG tablet TAKE 1 TABLET(100 MG) BY MOUTH AT BEDTIME AS NEEDED FOR SLEEP 90 tablet 1   triamcinolone (KENALOG) 0.1 % Apply 1 application topically 2 (two) times daily. 30 g 0    Allergies as of 06/22/2021 - Review Complete 06/22/2021  Allergen Reaction Noted   Sulfa antibiotics Swelling and Other (See Comments) 01/18/2018   Advil [ibuprofen] Other (See Comments) 01/24/2020   Aleve [naproxen] Other (See Comments) 01/24/2020   Betadine [povidone iodine] Itching and Rash 01/18/2018   Chloroxylenol (antiseptic) Itching, Rash, and Other (See Comments) 01/03/2018   Poison ivy extract Rash 01/18/2018   Povidone-iodine Itching and Rash 03/09/2010     Review of Systems:    Constitutional: No fever or chills Skin: No rash  Cardiovascular: No chest pain Respiratory: No SOB Gastrointestinal: See HPI and otherwise negative Genitourinary: No dysuria  Neurological: No headache, dizziness or syncope Musculoskeletal: No new muscle or joint pain Hematologic: No bleeding Psychiatric: No history of depression or anxiety    Physical Exam:  Vital signs in last 24 hours: Temp:  [98.5 F (36.9 C)] 98.5 F (36.9 C) (07/18 1140) Pulse Rate:  [71-83] 71 (07/18 1330) Resp:  [16-20] 20 (07/18 1330) BP: (106-116)/(57-68) 107/57 (07/18 1330) SpO2:  [98 %-100 %] 99 % (07/18 1330) Weight:  [65.3 kg] 65.3 kg (07/18 1204)   General:   Pleasant Jaundiced Caucasian male appears to be in NAD, Well developed, Well nourished, alert and cooperative Head:  Normocephalic and atraumatic. Eyes:   PEERL, EOMI. No icterus. Conjunctiva pink. Ears:  Normal auditory acuity. Neck:  Supple Throat: Oral cavity  and pharynx without  inflammation, swelling or lesion.  Lungs: Respirations even and unlabored. Lungs clear to auscultation bilaterally.   No wheezes, crackles, or rhonchi.  Heart: Normal S1, S2. No MRG. Regular rate and rhythm. No peripheral edema, cyanosis or pallor.  Abdomen:  Soft, nondistended, nontender. No rebound or guarding. Normal bowel sounds. No appreciable masses or hepatomegaly. Rectal:  Not performed.  Msk:  Symmetrical without gross deformities. Peripheral pulses intact.  Extremities:  Without edema, no deformity or joint abnormality. Neurologic:  Alert and  oriented x4;  grossly normal neurologically.   Skin:   Dry and intact without significant lesions or rashes. Psychiatric: Demonstrates good judgement and reason without abnormal affect or behaviors. Impaired short term memory   LAB RESULTS: Recent Labs    06/19/21 1425 06/22/21 1312  WBC 15.7* 14.3*  HGB 10.6* 11.3*  HCT 31.0* 33.5*  PLT 488.0* 562*   BMET Recent Labs    06/19/21 1425  NA 124*  K 3.0*  CL 90*  CO2 25  GLUCOSE 110*  BUN 24*  CREATININE 1.18  CALCIUM 9.0   Hepatic Function Latest Ref Rng & Units 06/19/2021 06/09/2021 12/24/2020  Total Protein 6.0 - 8.3 g/dL 6.5 6.4 7.6  Albumin 3.5 - 5.2 g/dL 3.1(L) 3.4(L) 4.7  AST 0 - 37 U/L 417(H) 592(H) 18  ALT 0 - 53 U/L 476(H) 537(H) 18  Alk Phosphatase 39 - 117 U/L 2,495(H) 2,387(H) 57  Total Bilirubin 0.2 - 1.2 mg/dL 28.8(H) 18.5(H) 0.9  Bilirubin, Direct 0.0 - 0.3 mg/dL - 10.2(H) -     PT/INR Recent Labs    06/19/21 1425  LABPROT 18.5*  INR 1.7*     Impression / Plan:   Impression: 1.  Elevated LFTs: See HPI and labs above,with imaging questioning cholangiocarcinoma 2.  Painless jaundice: With above 3.  Leukocytosis: Concern for ascending cholangitis 4.  Hypokalemia  Plan: 1.  Agree with MRCP, will reorder today. 2.  Plan will be to proceed with ERCP likely on 06/24/2021 with Dr. Rush Landmark for decompression and bile duct brushings for cytology.  Did  review risks, benefits, limitations and alternatives the patient today and he agrees to proceed. 3.  Continue to monitor daily LFTs 4.  Agree with supportive measures including correcting his hypokalemia prior to procedure scheduled for Wednesday 5.  Patient can be on a regular diet today and tomorrow and will be n.p.o. at midnight on 7/19 6.  Would recommend starting antibiotics given leukocytosis  Thank you for your kind consultation, we will continue to follow.  Lavone Nian Guiselle Mian  06/22/2021, 1:51 PM

## 2021-06-22 NOTE — ED Provider Notes (Addendum)
Emergency Medicine Provider Triage Evaluation Note  Ruben Chavez , a 69 y.o. male  was evaluated in triage.  Pt complains of fatigue, anorexia, and yellowing of the skin and eyes x2 weeks.  Additionally has pale stools and really dark-colored urine.  Poor p.o. tolerance due to nausea.  History of heavy drinking for approximately 20 years, no longer drinks at this time but unable to provide timeline for cessation.  Presents at recommendation of his PCP for concern for abnormal laboratory studies.  Findings of abdominal pelvic CT on 7/7 concerning for pharyngeal carcinoma.  Patient scheduled for MRI and MRCP later this week, return to the ER today due to worsening laboratory studies at his PCP.  Review of Systems  Positive: Jaundice, scleral icterus, dry mucous membranes, pale stools, dark urine, NBNB emesis, constipation Negative: Abdominal pain, diarrhea, chest pain, shortness of breath  Physical Exam  BP 116/68   Pulse 78   Temp 98.5 F (36.9 C) (Oral)   Resp 16   Ht 5\' 11"  (1.803 m)   Wt 65.3 kg   SpO2 98%   BMI 20.08 kg/m  Gen:   Awake, no distress   Resp:  Normal effort  MSK:   Moves extremities without difficulty  Other:  Jaundice, scleral icterus  Medical Decision Making  Medically screening exam initiated at 12:30 PM.  Appropriate orders placed.  [name] was informed that the remainder of the evaluation will be completed by another provider, this initial triage assessment does not replace that evaluation, and the importance of remaining in the ED until their evaluation is complete.  Vital signs are normal at this time, however patient clinically appears significantly jaundiced.  Will inform RN for prioritization of his movement to the back.  This chart was dictated using voice recognition software, Dragon. Despite the best efforts of this provider to proofread and correct errors, errors may still occur which can change documentation meaning.    Emeline Darling, PA-C 06/22/21 1232    Emerita Berkemeier, Gypsy Balsam, PA-C 06/22/21 1234    Hayden Rasmussen, MD 06/22/21 [phone]

## 2021-06-22 NOTE — Progress Notes (Signed)
Pharmacy Antibiotic Note  Ruben Chavez is a 70 y.o. male admitted on 06/22/2021 with  intra-abdominal infection- jaundice with possible cholangitis .  Pharmacy has been consulted for Cefepime dosing.  Plan: Cefepime 2gm IV q8h No dose adjustments anticipated.  Pharmacy will sign off & monitor peripherally using electronic surveillance software.   Height: 5\' 11"  (180.3 cm) Weight: 65.3 kg (144 lb) IBW/kg (Calculated) : 75.3  Temp (24hrs), Avg:98.5 F (36.9 C), Min:98.5 F (36.9 C), Max:98.5 F (36.9 C)  Recent Labs  Lab 06/19/21 1425 06/22/21 1312  WBC 15.7* 14.3*  CREATININE 1.18 0.57*  LATICACIDVEN  --  0.8    Estimated Creatinine Clearance: 78.2 mL/min (A) (by C-G formula based on SCr of 0.57 mg/dL (L)).    Allergies  Allergen Reactions   Sulfa Antibiotics Swelling and Other (See Comments)    Swelling in ankles    Advil [Ibuprofen] Other (See Comments)    "Dots on chest" (Petechiae)   Aleve [Naproxen] Other (See Comments)    "Dots on chest" (Petechiae)   Betadine [Povidone Iodine] Itching and Rash   Chloroxylenol (Antiseptic) Itching, Rash and Other (See Comments)    PCMX surgical sterilizing scrub   Poison Ivy Extract Rash   Povidone-Iodine Itching and Rash    BETADINE    Thank you for allowing pharmacy to be a part of this patient's care.  Netta Cedars PharmD 06/22/2021 4:57 PM

## 2021-06-22 NOTE — H&P (Signed)
History and Physical    Ruben Chavez WGN:[id number] DOB: 01-Nov-1951 DOA: 06/22/2021  PCP: Isaac Bliss, Rayford Halsted, MD  Patient coming from: home   Chief Complaint: jaundice  HPI: Ruben Chavez is a 70 y.o. male with medical history significant for history cva, hypretension, who presents with above.  Developed painless jaundice about a month ago. Saw GI recently, plan for mrcp/ercp in a few days. Over past 2 weeks has developed extreme fatigue and also loss of appetite. Vomited once. No fevers, no abdominal pain. No chest pain, no cough.  ED Course:   GI consult, labs  Review of Systems: As per HPI otherwise 10 point review of systems negative.    Past Medical History:  Diagnosis Date   Elevated PSA    Hyperlipidemia    pt says not anymore   Hypertension    Prostate cancer ([place]) 2013    Past Surgical History:  Procedure Laterality Date   bilateral inguinal hernia  2008   BUBBLE STUDY  01/28/2020   Procedure: BUBBLE STUDY;  Surgeon: [name], MD;  Location: [facility] ENDOSCOPY;  Service: Cardiovascular;;   CYST REMOVAL TRUNK  2016   sebaceous cyst   CYSTOSCOPY  04/22/2016   Procedure: CYSTOSCOPY;  Surgeon: [name], MD;  Location: [place];  Service: Urology;;   IR ANGIO INTRA EXTRACRAN SEL COM CAROTID INNOMINATE BILAT MOD SED  05/30/2020   IR ANGIO VERTEBRAL SEL SUBCLAVIAN INNOMINATE UNI R MOD SED  01/25/2020   IR ANGIO VERTEBRAL SEL VERTEBRAL BILAT MOD SED  05/30/2020   IR CT HEAD LTD  01/25/2020   IR PERCUTANEOUS ART THROMBECTOMY/INFUSION INTRACRANIAL INC DIAG ANGIO  01/25/2020   IR US GUIDE VASC ACCESS RIGHT  05/30/2020   KNEE ARTHROSCOPY  2007   right   LOOP RECORDER INSERTION N/A 01/28/2020   Procedure: LOOP RECORDER INSERTION;  Surgeon: [name], MD;  Location: [place];  Service: Cardiovascular;  Laterality: N/A;   PROSTATE BIOPSY  2008   PROSTATE BIOPSY  2013   PROSTATE BIOPSY  01/28/2016   RADIOACTIVE SEED  IMPLANT N/A 04/22/2016   Procedure: RADIOACTIVE SEED IMPLANT/BRACHYTHERAPY IMPLANT;  Surgeon: [name], MD;  Location: [facility];  Service: Urology;  Laterality: N/A;   RADIOLOGY WITH ANESTHESIA N/A 01/24/2020   Procedure: IR WITH ANESTHESIA;  Surgeon: [name], MD;  Location: [place];  Service: Radiology;  Laterality: N/A;   TEE WITHOUT CARDIOVERSION N/A 01/28/2020   Procedure: TRANSESOPHAGEAL ECHOCARDIOGRAM (TEE);  Surgeon: [name], MD;  Location: [facility] ENDOSCOPY;  Service: Cardiovascular;  Laterality: N/A;   TONSILLECTOMY     TOTAL HIP ARTHROPLASTY  2001   left   TOTAL HIP ARTHROPLASTY Right 01/18/2018   Procedure: RIGHT TOTAL HIP ARTHROPLASTY ANTERIOR APPROACH;  Surgeon: [name], MD;  Location: [facility] ORS;  Service: Orthopedics;  Laterality: Right;     reports that he quit smoking about 22 years ago. His smoking use included cigarettes. He has a 32.00 pack-year smoking history. He has never used smokeless tobacco. He reports previous alcohol use of about 14.0 standard drinks of alcohol per week. He reports that he does not use drugs.  Allergies  Allergen Reactions   Sulfa Antibiotics Swelling and Other (See Comments)    Swelling in ankles    Advil [Ibuprofen] Other (See Comments)    "Dots on chest" (Petechiae)   Aleve [Naproxen] Other (See Comments)    "Dots on chest" (Petechiae)   Betadine [Povidone Iodine] Itching and  Rash   Chloroxylenol (Antiseptic) Itching, Rash and Other (See Comments)    PCMX surgical sterilizing scrub   Poison Ivy Extract Rash   Povidone-Iodine Itching and Rash    BETADINE    Family History  Problem Relation Age of Onset   Pancreatic cancer Mother        small bowel cancer   Heart disease Father    Colon cancer Neg Hx    Stomach cancer Neg Hx     Prior to Admission medications   Medication Sig Start Date End Date Taking? Authorizing Provider  aspirin EC 325 MG EC tablet Take 1 tablet (325 mg total) by mouth  daily. 01/29/20   [name], NP  atorvastatin (LIPITOR) 20 MG tablet TAKE 1 TABLET(20 MG) BY MOUTH DAILY AT 6 PM 02/05/21   Isaac Bliss, Rayford Halsted, MD  buPROPion (WELLBUTRIN XL) 300 MG 24 hr tablet TAKE 1 TABLET(300 MG) BY MOUTH DAILY 02/05/21   Isaac Bliss, Rayford Halsted, MD  folic acid (FOLVITE) 1 MG tablet TAKE 1 TABLET(1 MG) BY MOUTH DAILY 02/05/21   Isaac Bliss, Rayford Halsted, MD  lisinopril-hydrochlorothiazide (ZESTORETIC) 20-25 MG tablet TAKE 1 TABLET BY MOUTH DAILY 05/06/21   Isaac Bliss, Rayford Halsted, MD  Magnesium 250 MG TABS Take 250 mg by mouth daily.     [provider]  Multiple Vitamin (MULTIVITAMIN WITH MINERALS) TABS tablet Take 1 tablet by mouth daily. 01/29/20   [name], NP  pantoprazole (PROTONIX) 40 MG tablet Take 1 tablet (40 mg total) by mouth daily. 06/04/21   Isaac Bliss, Rayford Halsted, MD  senna-docusate (SENOKOT-S) 8.6-50 MG tablet Take 2 tablets by mouth daily. 02/13/20   Angiulli, Lavon Paganini, PA-C  traZODone (DESYREL) 100 MG tablet TAKE 1 TABLET(100 MG) BY MOUTH AT BEDTIME AS NEEDED FOR SLEEP 03/03/21   Isaac Bliss, Rayford Halsted, MD  triamcinolone (KENALOG) 0.1 % Apply 1 application topically 2 (two) times daily. 03/05/21   Erline Hau, MD    Physical Exam: Vitals:   06/22/21 1500 06/22/21 1530 06/22/21 1600 06/22/21 1630  BP: (!) 112/52 (!) 106/48 (!) 108/58 (!) 122/56  Pulse: 71 67 72 71  Resp: _0 Temp:      TempSrc:      SpO2: 100% 100% 95% 100%  Weight:      Height:        Constitutional: No acute distress Head: Atraumatic Eyes: scleral icterus ENM: Moist mucous membranes. Normal dentition.  Neck: Supple Respiratory: Clear to auscultation bilaterally, no wheezing/rales/rhonchi. Normal respiratory effort. No accessory muscle use. . Cardiovascular: Regular rate and rhythm. No murmurs/rubs/gallops. Abdomen: Non-tender, non-distended. No masses. No rebound or guarding. Positive bowel sounds. Musculoskeletal: No joint  deformity upper and lower extremities. Normal ROM, no contractures. Normal muscle tone.  Skin: No rashes, lesions, or ulcers. Marked jaundice Extremities: No peripheral edema. Palpable peripheral pulses. Neurologic: Alert, moving all 4 extremities. Psychiatric: Normal insight and judgement.   Labs on Admission: I have personally reviewed following labs and imaging studies  CBC: Recent Labs  Lab 06/19/21 1425 06/22/21 1312  WBC 15.7* 14.3*  NEUTROABS  --  12.6*  HGB 10.6* 11.3*  HCT 31.0* 33.5*  MCV 88.7 87.9  PLT 488.0* 403*   Basic Metabolic Panel: Recent Labs  Lab 06/19/21 1425 06/22/21 1312  NA 124* 127*  K 3.0* 3.1*  CL 90* 90*  CO2 25 25  GLUCOSE 110* 85  BUN 24* 27*  CREATININE 1.18 0.57*  CALCIUM 9.0  9.5   GFR: Estimated Creatinine Clearance: 78.2 mL/min (A) (by C-G formula based on SCr of 0.57 mg/dL (L)). Liver Function Tests: Recent Labs  Lab 06/19/21 1425 06/22/21 1312  AST 417* 420*  ALT 476* 436*  ALKPHOS 2,495* 2,274*  BILITOT 28.8* 28.1*  PROT 6.5 7.8  ALBUMIN 3.1* 2.6*   Recent Labs  Lab 06/22/21 1312  LIPASE 40   Recent Labs  Lab 06/22/21 1312  AMMONIA 65*   Coagulation Profile: Recent Labs  Lab 06/19/21 1425 06/22/21 1312  INR 1.7* 1.8*   Cardiac Enzymes: No results for input(s): CKTOTAL, CKMB, CKMBINDEX, TROPONINI in the last 168 hours. BNP (last 3 results) No results for input(s): PROBNP in the last 8760 hours. HbA1C: No results for input(s): HGBA1C in the last 72 hours. CBG: No results for input(s): GLUCAP in the last 168 hours. Lipid Profile: No results for input(s): CHOL, HDL, LDLCALC, TRIG, CHOLHDL, LDLDIRECT in the last 72 hours. Thyroid Function Tests: No results for input(s): TSH, T4TOTAL, FREET4, T3FREE, THYROIDAB in the last 72 hours. Anemia Panel: No results for input(s): VITAMINB12, FOLATE, FERRITIN, TIBC, IRON, RETICCTPCT in the last 72 hours. Urine analysis:    Component Value Date/Time   COLORURINE  yellow 03/13/2010 1145   APPEARANCEUR Clear 03/13/2010 1145   LABSPEC 1.025 03/13/2010 1145   PHURINE 6.0 03/13/2010 1145   HGBUR negative 03/13/2010 1145   BILIRUBINUR 1+ 11/13/2019 1341   PROTEINUR Positive (A) 11/13/2019 1341   UROBILINOGEN 0.2 11/13/2019 1341   UROBILINOGEN 0.2 03/13/2010 1145   NITRITE 0.2 11/13/2019 1341   NITRITE negative 03/13/2010 1145   LEUKOCYTESUR Negative 11/13/2019 1341    Radiological Exams on Admission: No results found.   Assessment/Plan Active Problems:   Essential hypertension   Stroke (cerebrum) (HCC) -  [name]   Bile duct obstruction   # Bile duct obstruction One month painless jaundice. CT from 7/7 shows intrahepatic bile duct dilation with ill-defined infiltrative soft tissue mass. Cholangiocarcinoma on ddx. Admitted for expedited w/u. LFTs markedly elevated - gi advising abx, will continue cefepime/flagyl - plan for expedited mrcp/ercp, final gi recs are pending, will make npo after midnight  # Hyponatremia, chronic # Hypokalemia 2/2 reduced po, hepatic process, hctz. - hold home lisinopril/hctz - normal saline @ 125 w/ 20 meq kcl - f/u mg - f/u serum osm, urine osm, urine sodium - trend  # HTN Here borderline low bp - hold home lisin/hctz  # Hx CVA # Partial left hemiparesis - cont home aspirin, hold statin given sig liver dysfunction  # Insomnia - home trazodone  # Depression - home wellbutrin  DVT prophylaxis: lovenox Code Status: full  Family Communication: none @ bedside  Consults called: GI   Level of care: Med-Surg Status is: Inpatient  Remains inpatient appropriate because:Inpatient level of care appropriate due to severity of illness  Dispo: The patient is from: Home              Anticipated d/c is to: Home              Patient currently is not medically stable to d/c.   Difficult to place  patient No        Desma Maxim MD [facility] Pager [phone]  If 7PM-7AM, please contact night-coverage [email or web address] Password [facility]  06/22/2021, 4:39 PM

## 2021-06-22 NOTE — ED Triage Notes (Signed)
Patient has several abnormal labs including WBC 15.7, K+-3.0 and AST-417. ALT-476.  Patient states he has been jaundiced x 2 weeks.

## 2021-06-22 NOTE — Telephone Encounter (Signed)
I spoke with the patient's wife.  She is advised to take him to [facility] ED.  She states they got my message and just arrived at [facility] and will go to the ED

## 2021-06-22 NOTE — Progress Notes (Signed)
Ruben Chavez, I reviewed his OV and labs just now.  His sodium is down to 124, K 3.0, Tbili up to 29 and INR is newly elevated to 1.7.  I think he is having liver decompensation +/- cholangitis and it is probably best that he not wait for care, ERCP.  I think admission to hospital today is a good idea to address his decompensation prior to ERCP attempt while inpatient.  I'm including GM as he is covering 'purple' doc this week.

## 2021-06-22 NOTE — Telephone Encounter (Signed)
Message Received: Today Ladene Artist, MD sent to Marlon Pel, RN Jess if off today. She saw this patient last week and for reasons outlined by DJ please call the patient to recommend admission via the ED today.         Previous Messages      Routed Note  Author: Milus Banister, MD Service: Gastroenterology Author Type: Physician  Filed: 06/22/2021  5:53 AM Encounter Date: 06/19/2021 Status: Signed  Editor: Milus Banister, MD (Physician)          Cammy Copa, I reviewed his OV and labs just now.  His sodium is down to 124, K 3.0, Tbili up to 29 and INR is newly elevated to 1.7.  I think he is having liver decompensation +/- cholangitis and it is probably best that he not wait for care, ERCP.  I think admission to hospital today is a good idea to address his decompensation prior to ERCP attempt while inpatient.  I'm including GM as he is covering 'purple' doc this week       Left message for patient to call back on both his and his wife's listed numbers

## 2021-06-22 NOTE — ED Provider Notes (Signed)
[facility] DEPT Provider Note   CSN: [id number] Arrival date & time: 06/22/21  1127     History Chief Complaint  Patient presents with   abnormal labs    Ruben Chavez is a 70 y.o. male.  Elevation of abnormal labs and jaundice.  He has had a months worth of jaundice weight loss fatigue poor appetite light-colored stools dark-colored urine.  He had an abnormal outpatient CAT scan.  He has been seen by low-power GI and is set up for an MRCP and an endoscopy.  He had some labs done recently and it was recommended that he come to the hospital and be admitted.  He denies any chest pain or abdominal pain.  Intermittent cough.  No headache or fevers.  The history is provided by the patient.  Weakness Severity:  Severe Onset quality:  Gradual Duration:  3 weeks Timing:  Intermittent Progression:  Worsening Chronicity:  New Relieved by:  Nothing Worsened by:  Activity Ineffective treatments:  Rest Associated symptoms: cough and difficulty walking   Associated symptoms: no abdominal pain, no chest pain, no diarrhea, no dysuria, no falls, no fever, no headaches, no melena, no nausea, no shortness of breath and no vomiting       Past Medical History:  Diagnosis Date   Elevated PSA    Hyperlipidemia    pt says not anymore   Hypertension    Prostate cancer ([place]) 2013    Patient Active Problem List   Diagnosis Date Noted   Abnormal abdominal CT scan 06/19/2021   Jaundice 06/19/2021   Weight loss, unintentional 06/19/2021   Biliary obstruction 06/19/2021   Left hemiparesis ([place]) 03/28/2020   Chronic bilateral low back pain without sciatica 02/25/2020   Spasticity as late effect of cerebrovascular accident (CVA) 02/25/2020   Cognitive and neurobehavioral dysfunction    Hypertensive urgency 01/28/2020   Urinary retention 01/28/2020   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   Stroke (cerebrum) ([place]) -  R MAC infarct due to R MCA  occlusion s/p tPA and IR with TICI2b recanalization - embolic secondary to unknown source 01/25/2020   Acute respiratory failure with hypoxia ([facility])    Hypotension    Hypokalemia    Hypocalcemia    Encephalopathy acute    Stroke ([place]) 01/24/2020   Middle cerebral artery embolism, right 01/24/2020   Hyperlipidemia 02/21/2019   Impingement syndrome of right shoulder region 02/21/2018   OA (osteoarthritis) of hip 01/18/2018   Prostate cancer ([place]) 03/04/2016   Essential hypertension 11/27/2014   History of colonic polyps 09/26/2014   BACK PAIN, LEFT 12/22/2009   PROSTATE SPECIFIC ANTIGEN, ELEVATED 12/20/2008   LATERAL EPICONDYLITIS 07/15/2008   ACTINIC KERATOSIS, FOREHEAD, LEFT 05/23/2008   WART, LEFT HAND 07/19/2007   ERECTILE DYSFUNCTION, MILD 07/19/2007   HERNIA, BILATERAL INGUINAL W/O OBST/GANGRENE 07/19/2007   MENISCUS TEAR 07/19/2007   Bilateral inguinal hernia 07/19/2007    Past Surgical History:  Procedure Laterality Date   bilateral inguinal hernia  2008   BUBBLE STUDY  01/28/2020   Procedure: BUBBLE STUDY;  Surgeon: [name], MD;  Location: [facility];  Service: Cardiovascular;;   CYST REMOVAL TRUNK  2016   sebaceous cyst   CYSTOSCOPY  04/22/2016   Procedure: CYSTOSCOPY;  Surgeon: [name], MD;  Location: [facility];  Service: Urology;;   IR ANGIO INTRA EXTRACRAN SEL COM CAROTID INNOMINATE BILAT MOD SED  05/30/2020   IR ANGIO VERTEBRAL SEL SUBCLAVIAN INNOMINATE UNI R MOD SED  01/25/2020   IR ANGIO VERTEBRAL SEL VERTEBRAL BILAT MOD SED  05/30/2020   IR CT HEAD LTD  01/25/2020   IR PERCUTANEOUS ART THROMBECTOMY/INFUSION INTRACRANIAL INC DIAG ANGIO  01/25/2020   IR US GUIDE VASC ACCESS RIGHT  05/30/2020   KNEE ARTHROSCOPY  2007   right   LOOP RECORDER INSERTION N/A 01/28/2020   Procedure: LOOP RECORDER INSERTION;  Surgeon: [name], MD;  Location: [facility];  Service: Cardiovascular;  Laterality: N/A;   PROSTATE BIOPSY  2008    PROSTATE BIOPSY  2013   PROSTATE BIOPSY  01/28/2016   RADIOACTIVE SEED IMPLANT N/A 04/22/2016   Procedure: RADIOACTIVE SEED IMPLANT/BRACHYTHERAPY IMPLANT;  Surgeon: [name], MD;  Location: [facility];  Service: Urology;  Laterality: N/A;   RADIOLOGY WITH ANESTHESIA N/A 01/24/2020   Procedure: IR WITH ANESTHESIA;  Surgeon: [name], MD;  Location: [facility];  Service: Radiology;  Laterality: N/A;   TEE WITHOUT CARDIOVERSION N/A 01/28/2020   Procedure: TRANSESOPHAGEAL ECHOCARDIOGRAM (TEE);  Surgeon: [name], MD;  Location: [facility];  Service: Cardiovascular;  Laterality: N/A;   TONSILLECTOMY     TOTAL HIP ARTHROPLASTY  2001   left   TOTAL HIP ARTHROPLASTY Right 01/18/2018   Procedure: RIGHT TOTAL HIP ARTHROPLASTY ANTERIOR APPROACH;  Surgeon: [name], MD;  Location: [facility];  Service: Orthopedics;  Laterality: Right;       Family History  Problem Relation Age of Onset   Pancreatic cancer Mother        small bowel cancer   Heart disease Father    Colon cancer Neg Hx    Stomach cancer Neg Hx     Social History   Tobacco Use   Smoking status: Former    Packs/day: 1.00    Years: 32.00    Pack years: 32.00    Types: Cigarettes    Quit date: 06/06/1999    Years since quitting: 22.0   Smokeless tobacco: Never  Vaping Use   Vaping Use: Never used  Substance Use Topics   Alcohol use: Not Currently    Alcohol/week: 14.0 standard drinks    Types: 44 Shots of liquor per week   Drug use: No    Home Medications Prior to Admission medications   Medication Sig Start Date End Date Taking? Authorizing Provider  aspirin EC 325 MG EC tablet Take 1 tablet (325 mg total) by mouth daily. 01/29/20   [name], NP  atorvastatin (LIPITOR) 20 MG tablet TAKE 1 TABLET(20 MG) BY MOUTH DAILY AT 6 PM 02/05/21   Isaac Bliss, Rayford Halsted, MD  buPROPion (WELLBUTRIN XL) 300 MG 24 hr tablet TAKE 1 TABLET(300 MG) BY MOUTH DAILY 02/05/21   Isaac Bliss,  Rayford Halsted, MD  folic acid (FOLVITE) 1 MG tablet TAKE 1 TABLET(1 MG) BY MOUTH DAILY 02/05/21   Isaac Bliss, Rayford Halsted, MD  lisinopril-hydrochlorothiazide (ZESTORETIC) 20-25 MG tablet TAKE 1 TABLET BY MOUTH DAILY 05/06/21   Isaac Bliss, Rayford Halsted, MD  Magnesium 250 MG TABS Take 250 mg by mouth daily.     [provider]  Multiple Vitamin (MULTIVITAMIN WITH MINERALS) TABS tablet Take 1 tablet by mouth daily. 01/29/20   [name], NP  pantoprazole (PROTONIX) 40 MG tablet Take 1 tablet (40 mg total) by mouth daily. 06/04/21   Isaac Bliss, Rayford Halsted, MD  senna-docusate (SENOKOT-S) 8.6-50 MG tablet Take 2 tablets by mouth daily. 02/13/20   Angiulli, Lavon Paganini, PA-C  traZODone (DESYREL) 100 MG tablet  TAKE 1 TABLET(100 MG) BY MOUTH AT BEDTIME AS NEEDED FOR SLEEP 03/03/21   Isaac Bliss, Rayford Halsted, MD  triamcinolone (KENALOG) 0.1 % Apply 1 application topically 2 (two) times daily. 03/05/21   Isaac Bliss, Rayford Halsted, MD    Allergies    Sulfa antibiotics, Advil [ibuprofen], Aleve [naproxen], Betadine [povidone iodine], Chloroxylenol (antiseptic), Poison ivy extract, and Povidone-iodine  Review of Systems   Review of Systems  Constitutional:  Negative for fever.  HENT:  Negative for sore throat.   Eyes:  Negative for visual disturbance.  Respiratory:  Positive for cough. Negative for shortness of breath.   Cardiovascular:  Negative for chest pain.  Gastrointestinal:  Negative for abdominal pain, diarrhea, melena, nausea and vomiting.  Genitourinary:  Negative for dysuria.  Musculoskeletal:  Positive for gait problem. Negative for falls.  Skin:  Positive for color change. Negative for rash.  Neurological:  Positive for weakness. Negative for headaches.   Physical Exam Updated Vital Signs BP 116/68   Pulse 78   Temp 98.5 F (36.9 C) (Oral)   Resp 16   Ht _0  (1.803 m)   Wt 65.3 kg   SpO2 98%   BMI 20.08 kg/m   Physical Exam Vitals and nursing note reviewed.   Constitutional:      Appearance: Normal appearance. He is well-developed.  HENT:     Head: Normocephalic and atraumatic.  Eyes:     General: Scleral icterus present.     Conjunctiva/sclera: Conjunctivae normal.  Cardiovascular:     Rate and Rhythm: Normal rate and regular rhythm.     Heart sounds: No murmur heard. Pulmonary:     Effort: Pulmonary effort is normal. No respiratory distress.     Breath sounds: Normal breath sounds.  Abdominal:     Palpations: Abdomen is soft.     Tenderness: There is no abdominal tenderness. There is no guarding or rebound.  Musculoskeletal:        General: Normal range of motion.     Cervical back: Neck supple.  Skin:    General: Skin is warm and dry.     Capillary Refill: Capillary refill takes less than 2 seconds.     Coloration: Skin is jaundiced.  Neurological:     General: No focal deficit present.     Mental Status: He is alert and oriented to person, place, and time.     Comments: He is awake and alert.  No facial asymmetry or speech difficulty.  He has some baseline left hand weakness.  Walks with a cane.    ED Results / Procedures / Treatments   Labs (all labs ordered are listed, but only abnormal results are displayed) Labs Reviewed  COMPREHENSIVE METABOLIC PANEL - Abnormal; Notable for the following components:      Result Value   Sodium 127 (*)    Potassium 3.1 (*)    Chloride 90 (*)    BUN 27 (*)    Creatinine, Ser 0.57 (*)    Albumin 2.6 (*)    AST 420 (*)    ALT 436 (*)    Alkaline Phosphatase 2,274 (*)    Total Bilirubin 28.1 (*)    All other components within normal limits  CBC WITH DIFFERENTIAL/PLATELET - Abnormal; Notable for the following components:   WBC 14.3 (*)    RBC 3.81 (*)    Hemoglobin 11.3 (*)    HCT 33.5 (*)    RDW 16.9 (*)    Platelets 562 (*)  Neutro Abs 12.6 (*)    Lymphs Abs 0.5 (*)    Monocytes Absolute 1.1 (*)    All other components within normal limits  ACETAMINOPHEN LEVEL - Abnormal;  Notable for the following components:   Acetaminophen (Tylenol), Serum <10 (*)    All other components within normal limits  AMMONIA - Abnormal; Notable for the following components:   Ammonia 65 (*)    All other components within normal limits  PROTIME-INR - Abnormal; Notable for the following components:   Prothrombin Time 20.9 (*)    INR 1.8 (*)    All other components within normal limits  MAGNESIUM - Abnormal; Notable for the following components:   Magnesium 2.8 (*)    All other components within normal limits  RESP PANEL BY RT-PCR (FLU A&B, COVID) ARPGX2  CULTURE, BLOOD (ROUTINE X 2)  CULTURE, BLOOD (ROUTINE X 2)  LIPASE, BLOOD  LACTIC ACID, PLASMA  ETHANOL  URINALYSIS, ROUTINE W REFLEX MICROSCOPIC    EKG None  Radiology No results found.  Procedures Procedures   Medications Ordered in ED Medications  ceFEPIme (MAXIPIME) 2 g in sodium chloride 0.9 % 100 mL IVPB (has no administration in time range)  ceFEPIme (MAXIPIME) 2 g in sodium chloride 0.9 % 100 mL IVPB (0 g Intravenous Stopped 06/22/21 1502)    And  metroNIDAZOLE (FLAGYL) IVPB 500 mg (0 mg Intravenous Stopped 06/22/21 1602)  sodium chloride 0.9 % bolus 1,000 mL (1,000 mLs Intravenous New Bag/Given 06/22/21 1428)    ED Course  I have reviewed the triage vital signs and the nursing notes.  Pertinent labs & imaging results that were available during my care of the patient were reviewed by me and considered in my medical decision making (see chart for details).  Clinical Course as of 06/22/21 1828  Mon Jun 22, 2021  1327 Discussed with GI [place].  [MB]  5956 I would like patient on antibiotics.  They will follow along. [MB]  1620 Discussed with Dr. Si Raider.  He will evaluate patient for admission [MB]    Clinical Course User Index [MB] Hayden Rasmussen, MD   MDM Rules/Calculators/A&P                         This patient complains of abnormal labs, continued generalized weakness, jaundice; this  involves an extensive number of treatment Options and is a complaint that carries with it a high risk of complications and Morbidity. The differential includes liver failure, cholangitis, COVID angiosarcoma, pancreatic cancer  I ordered, reviewed and interpreted labs, which included CBC with elevated white count, hemoglobin stable from priors, chemistries with low sodium low potassium elevated BUN, elevated LFTs elevated bilirubin elevated alk phos consistent with some liver disease or obstruction, COVID testing negative, INR elevated reflecting some liver disease, ammonia elevated reflecting some liver damage I ordered medication IV antibiotics for possible cholangitis Additional history obtained from patient's wife Previous records obtained and reviewed in epic including prior GI and PCP notes I consulted GI PA Melba Coon and [facility] hospitalist [facility] and discussed lab and imaging findings  Critical Interventions: None  After the interventions stated above, I reevaluated the patient and found patient is still be feeling fatigued otherwise hemodynamically stable.  He will need admission to the hospital for likely MRCP and endoscopy intervention.  He is agreeable to plan   Final Clinical Impression(s) / ED Diagnoses Final diagnoses:  Obstructive jaundice  Hyponatremia  Hypokalemia    Rx /  DC Orders ED Discharge Orders     None        Hayden Rasmussen, MD 06/22/21 [phone]

## 2021-06-23 ENCOUNTER — Encounter (HOSPITAL_COMMUNITY): Payer: Self-pay | Admitting: Obstetrics and Gynecology

## 2021-06-23 ENCOUNTER — Inpatient Hospital Stay (HOSPITAL_COMMUNITY): Payer: PPO | Admitting: Anesthesiology

## 2021-06-23 ENCOUNTER — Inpatient Hospital Stay (HOSPITAL_COMMUNITY): Payer: PPO

## 2021-06-23 ENCOUNTER — Ambulatory Visit (HOSPITAL_COMMUNITY): Admission: RE | Admit: 2021-06-23 | Payer: PPO | Source: Ambulatory Visit

## 2021-06-23 ENCOUNTER — Encounter (HOSPITAL_COMMUNITY): Payer: Self-pay

## 2021-06-23 ENCOUNTER — Encounter (HOSPITAL_COMMUNITY)
Admission: EM | Disposition: A | Discharge status: Home Health | Payer: Self-pay | Source: Home / Self Care | Attending: Internal Medicine

## 2021-06-23 DIAGNOSIS — E876 Hypokalemia: Secondary | ICD-10-CM

## 2021-06-23 DIAGNOSIS — E871 Hypo-osmolality and hyponatremia: Secondary | ICD-10-CM | POA: Diagnosis not present

## 2021-06-23 DIAGNOSIS — Z4659 Encounter for fitting and adjustment of other gastrointestinal appliance and device: Secondary | ICD-10-CM

## 2021-06-23 DIAGNOSIS — K831 Obstruction of bile duct: Secondary | ICD-10-CM | POA: Diagnosis not present

## 2021-06-23 DIAGNOSIS — K2970 Gastritis, unspecified, without bleeding: Secondary | ICD-10-CM

## 2021-06-23 HISTORY — PX: BILIARY DILATION: SHX6850

## 2021-06-23 HISTORY — PX: ERCP: SHX5425

## 2021-06-23 HISTORY — PX: REMOVAL OF STONES: SHX5545

## 2021-06-23 HISTORY — PX: BIOPSY: SHX5522

## 2021-06-23 HISTORY — PX: BILIARY BRUSHING: SHX6843

## 2021-06-23 HISTORY — PX: PANCREATIC STENT PLACEMENT: SHX5539

## 2021-06-23 HISTORY — PX: BILIARY STENT PLACEMENT: SHX5538

## 2021-06-23 HISTORY — PX: SPHINCTEROTOMY: SHX5544

## 2021-06-23 LAB — COMPREHENSIVE METABOLIC PANEL
ALT: 340 U/L — ABNORMAL HIGH (ref 0–44)
AST: 325 U/L — ABNORMAL HIGH (ref 15–41)
Albumin: 2.2 g/dL — ABNORMAL LOW (ref 3.5–5.0)
Alkaline Phosphatase: 1911 U/L — ABNORMAL HIGH (ref 38–126)
Anion gap: 10 (ref 5–15)
BUN: 25 mg/dL — ABNORMAL HIGH (ref 8–23)
CO2: 24 mmol/L (ref 22–32)
Calcium: 9.2 mg/dL (ref 8.9–10.3)
Chloride: 95 mmol/L — ABNORMAL LOW (ref 98–111)
Creatinine, Ser: 0.44 mg/dL — ABNORMAL LOW (ref 0.61–1.24)
GFR, Estimated: >60 mL/min (ref >60)
Glucose, Bld: 67 mg/dL — ABNORMAL LOW (ref 70–99)
Potassium: 3.4 mmol/L — ABNORMAL LOW (ref 3.5–5.1)
Sodium: 129 mmol/L — ABNORMAL LOW (ref 135–145)
Total Bilirubin: 23.5 mg/dL (ref 0.3–1.2)
Total Protein: 6.7 g/dL (ref 6.5–8.1)

## 2021-06-23 LAB — CBC
HCT: 29.7 % — ABNORMAL LOW (ref 39.0–52.0)
Hemoglobin: 10.1 g/dL — ABNORMAL LOW (ref 13.0–17.0)
MCH: 30.1 pg (ref 26.0–34.0)
MCHC: 34 g/dL (ref 30.0–36.0)
MCV: 88.4 fL (ref 80.0–100.0)
Platelets: 439 10*3/uL — ABNORMAL HIGH (ref 150–400)
RBC: 3.36 MIL/uL — ABNORMAL LOW (ref 4.22–5.81)
RDW: 16.6 % — ABNORMAL HIGH (ref 11.5–15.5)
WBC: 13 10*3/uL — ABNORMAL HIGH (ref 4.0–10.5)
nRBC: 0 % (ref 0.0–0.2)

## 2021-06-23 LAB — SODIUM, URINE, RANDOM: Sodium, Ur: 23 mmol/L

## 2021-06-23 LAB — OSMOLALITY, URINE: Osmolality, Ur: 620 mOsm/kg (ref 300–900)

## 2021-06-23 SURGERY — ERCP, WITH INTERVENTION IF INDICATED
Anesthesia: General

## 2021-06-23 MED ORDER — SODIUM CHLORIDE 0.9 % IV SOLN: INTRAVENOUS | Status: DC

## 2021-06-23 MED ORDER — FENTANYL CITRATE (PF) 100 MCG/2ML IJ SOLN
INTRAMUSCULAR | Status: AC
  Filled 2021-06-23: qty 2

## 2021-06-23 MED ORDER — GLUCAGON HCL RDNA (DIAGNOSTIC) 1 MG IJ SOLR
INTRAMUSCULAR | Status: AC
  Filled 2021-06-23: qty 1

## 2021-06-23 MED ORDER — SODIUM CHLORIDE 0.9 % IV SOLN
INTRAVENOUS | Status: DC | PRN
  Administered 2021-06-23: 30 mL

## 2021-06-23 MED ORDER — LACTATED RINGERS IV SOLN: INTRAVENOUS | Status: DC

## 2021-06-23 MED ORDER — INDOMETHACIN 50 MG RE SUPP
100.0000 mg | Freq: Once | RECTAL | Status: DC
  Filled 2021-06-23: qty 2

## 2021-06-23 MED ORDER — INDOMETHACIN 50 MG RE SUPP
RECTAL | Status: AC
  Filled 2021-06-23: qty 2

## 2021-06-23 MED ORDER — SUGAMMADEX SODIUM 200 MG/2ML IV SOLN
INTRAVENOUS | Status: DC | PRN
  Administered 2021-06-23: 200 mg via INTRAVENOUS

## 2021-06-23 MED ORDER — ONDANSETRON HCL 4 MG/2ML IJ SOLN
INTRAMUSCULAR | Status: DC | PRN
  Administered 2021-06-23: 4 mg via INTRAVENOUS

## 2021-06-23 MED ORDER — PHENYLEPHRINE HCL-NACL 10-0.9 MG/250ML-% IV SOLN
INTRAVENOUS | Status: DC | PRN
  Administered 2021-06-23: 40 ug/min via INTRAVENOUS

## 2021-06-23 MED ORDER — GLUCAGON HCL RDNA (DIAGNOSTIC) 1 MG IJ SOLR
INTRAMUSCULAR | Status: DC | PRN
  Administered 2021-06-23 (×4): .25 mg via INTRAVENOUS

## 2021-06-23 MED ORDER — FENTANYL CITRATE (PF) 250 MCG/5ML IJ SOLN
INTRAMUSCULAR | Status: DC | PRN
  Administered 2021-06-23 (×2): 50 ug via INTRAVENOUS

## 2021-06-23 MED ORDER — ROCURONIUM BROMIDE 10 MG/ML (PF) SYRINGE
PREFILLED_SYRINGE | INTRAVENOUS | Status: DC | PRN
  Administered 2021-06-23: 50 mg via INTRAVENOUS

## 2021-06-23 MED ORDER — DEXAMETHASONE SODIUM PHOSPHATE 10 MG/ML IJ SOLN
INTRAMUSCULAR | Status: DC | PRN
  Administered 2021-06-23: 5 mg via INTRAVENOUS

## 2021-06-23 MED ORDER — GADOBUTROL 1 MMOL/ML IV SOLN
7.0000 mL | Freq: Once | INTRAVENOUS | Status: AC | PRN
  Administered 2021-06-23: 7 mL via INTRAVENOUS

## 2021-06-23 MED ORDER — PROPOFOL 10 MG/ML IV BOLUS
INTRAVENOUS | Status: DC | PRN
  Administered 2021-06-23: 130 mg via INTRAVENOUS

## 2021-06-23 MED ORDER — ENOXAPARIN SODIUM 40 MG/0.4ML IJ SOSY
40.0000 mg | PREFILLED_SYRINGE | INTRAMUSCULAR | Status: DC
  Administered 2021-06-23 – 2021-06-25 (×3): 40 mg via SUBCUTANEOUS
  Filled 2021-06-23 (×3): qty 0.4

## 2021-06-23 MED ORDER — ONDANSETRON HCL 4 MG/2ML IJ SOLN: 4.0000 mg | Freq: Once | INTRAMUSCULAR | Status: DC | PRN

## 2021-06-23 MED ORDER — LIDOCAINE 2% (20 MG/ML) 5 ML SYRINGE
INTRAMUSCULAR | Status: DC | PRN
  Administered 2021-06-23: 60 mg via INTRAVENOUS

## 2021-06-23 MED ORDER — PHENYLEPHRINE 40 MCG/ML (10ML) SYRINGE FOR IV PUSH (FOR BLOOD PRESSURE SUPPORT)
PREFILLED_SYRINGE | INTRAVENOUS | Status: DC | PRN
  Administered 2021-06-23: 40 ug via INTRAVENOUS
  Administered 2021-06-23 (×5): 80 ug via INTRAVENOUS

## 2021-06-23 NOTE — ED Notes (Signed)
Pt to MRI

## 2021-06-23 NOTE — Progress Notes (Signed)
Brief operative note  ERCP with biliary cannulation and wire placement into the left system. Biliary stent placed. Prophylactic pancreas stent placed. Gastric biopsies obtained due to severe gastritis. Updated family. Will evaluate patient in PACU. May advance diet as tolerated from clear liquids to heart healthy. Monitor for signs/symptoms of pancreatitis.  For probation note later this evening.  Justice Britain, MD [facility] Office # [phone]

## 2021-06-23 NOTE — Progress Notes (Signed)
Progress Note    Ruben Chavez  KDT:[id number] DOB: [birth year]  DOA: 06/22/2021 PCP: Isaac Bliss, Rayford Halsted, MD    Brief Narrative:     Medical records reviewed and are as summarized below:  Ruben Chavez is an 70 y.o. male with medical history significant for history cva, hypretension, who presents with above.   Developed painless jaundice about a month ago. Saw GI recently, plan for mrcp/ercp in a few days. Over past 2 weeks has developed extreme fatigue and also loss of appetite. MRCP done with plans for ERCP today.   Assessment/Plan:   Active Problems:   Essential hypertension   Stroke (cerebrum) (HCC) -  [name]   Bile duct obstruction   # Bile duct obstruction One month painless jaundice. CT from 7/7 shows intrahepatic bile duct dilation with ill-defined infiltrative soft tissue mass.  -MRI: 1. Marked diffuse intrahepatic biliary ductal dilatation with abrupt caliber transition at the biliary hilum. Suggestion of a poorly marginated enhancing 2.1 x 1.4 cm soft tissue mass at the biliary hilum with associated mildly restricted diffusion. 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 ERCP with brush biopsy correlation already planned by report. 2. Mild porta hepatis lymphadenopathy, indeterminate for metastatic disease. -ERCP planned for later today   # Hyponatremia/Hypokalemia 2/2 reduced po, hepatic process, hctz. - hold home lisinopril/hctz - normal saline @ 125 w/ 20 meq kcl   # HTN - hold home lisin/hctz   # Hx CVA # Partial left hemiparesis - cont home aspirin, hold statin given sig liver dysfunction   # Insomnia - home trazodone   # Depression - home wellbutrin      Family Communication/Anticipated D/C date and plan/Code Status   DVT prophylaxis: Lovenox ordered. Code Status: Full  Code.  Disposition Plan: Status is: Inpatient  Remains inpatient appropriate because:Inpatient level of care appropriate due to severity of illness  Dispo: The patient is from: Home              Anticipated d/c is to:  tbd              Patient currently is not medically stable to d/c.   Difficult to place patient No         Medical Consultants:   GI    Subjective:   Asking for some liquid  Objective:    Vitals:   06/23/21 0430 06/23/21 0700 06/23/21 0731 06/23/21 0800  BP: (!) 109/53 (!) 121/55 (!) 117/55 (!) 125/55  Pulse: 82 61 67 66  Resp: _0 Temp: 97.9 F (36.6 C)  97.9 F (36.6 C)   TempSrc: Oral  Oral   SpO2: 98% 100% 98% 99%  Weight:      Height:        Intake/Output Summary (Last 24 hours) at 06/23/2021 1051 Last data filed at 06/23/2021 9983 Gross per 24 hour  Intake 1280 ml  Output --  Net 1280 ml   Filed Weights   06/22/21 1204  Weight: 65.3 kg    Exam:  General: Appearance:    Jaundiced ill appearing male in no acute distress     Lungs:      respirations unlabored  Heart:    Normal heart rate.    MS:   All extremities are intact.    Neurologic:   Awake, alert, oriented x  3. No apparent focal neurological           defect.      Data Reviewed:   I have personally reviewed following labs and imaging studies:  Labs: Labs show the following:   Basic Metabolic Panel: Recent Labs  Lab 06/19/21 1425 06/22/21 1312 06/23/21 0435  NA 124* 127* 129*  K 3.0* 3.1* 3.4*  CL 90* 90* 95*  CO2 _0 GLUCOSE 110* 85 67*  BUN 24* 27* 25*  CREATININE 1.18 0.57* 0.44*  CALCIUM 9.0 9.5 9.2  MG  --  2.8*  --    GFR Estimated Creatinine Clearance: 78.2 mL/min (A) (by C-G formula based on SCr of 0.44 mg/dL (L)). Liver Function Tests: Recent Labs  Lab 06/19/21 1425 06/22/21 1312 06/23/21 0435  AST 417* 420* 325*  ALT 476* 436* 340*  ALKPHOS 2,495* 2,274* 1,911*  BILITOT 28.8* 28.1* 23.5*  PROT 6.5 7.8 6.7  ALBUMIN 3.1*  2.6* 2.2*   Recent Labs  Lab 06/22/21 1312  LIPASE 40   Recent Labs  Lab 06/22/21 1312  AMMONIA 65*   Coagulation profile Recent Labs  Lab 06/19/21 1425 06/22/21 1312  INR 1.7* 1.8*    CBC: Recent Labs  Lab 06/19/21 1425 06/22/21 1312 06/23/21 0435  WBC 15.7* 14.3* 13.0*  NEUTROABS  --  12.6*  --   HGB 10.6* 11.3* 10.1*  HCT 31.0* 33.5* 29.7*  MCV 88.7 87.9 88.4  PLT 488.0* 562* 439*   Cardiac Enzymes: No results for input(s): CKTOTAL, CKMB, CKMBINDEX, TROPONINI in the last 168 hours. BNP (last 3 results) No results for input(s): PROBNP in the last 8760 hours. CBG: No results for input(s): GLUCAP in the last 168 hours. D-Dimer: No results for input(s): DDIMER in the last 72 hours. Hgb A1c: No results for input(s): HGBA1C in the last 72 hours. Lipid Profile: No results for input(s): CHOL, HDL, LDLCALC, TRIG, CHOLHDL, LDLDIRECT in the last 72 hours. Thyroid function studies: No results for input(s): TSH, T4TOTAL, T3FREE, THYROIDAB in the last 72 hours.  Invalid input(s): FREET3 Anemia work up: No results for input(s): VITAMINB12, FOLATE, FERRITIN, TIBC, IRON, RETICCTPCT in the last 72 hours. Sepsis Labs: Recent Labs  Lab 06/19/21 1425 06/22/21 1312 06/23/21 0435  WBC 15.7* 14.3* 13.0*  LATICACIDVEN  --  0.8  --     Microbiology Recent Results (from the past 240 hour(s))  Resp Panel by RT-PCR (Flu A&B, Covid) Nasopharyngeal Swab     Status: None   Collection Time: 06/22/21  1:12 PM   Specimen: Nasopharyngeal Swab; Nasopharyngeal(NP) swabs in vial transport medium  Result Value Ref Range Status   SARS Coronavirus 2 by RT PCR NEGATIVE NEGATIVE Final    Comment: (NOTE) SARS-CoV-2 target nucleic acids are NOT DETECTED.  The SARS-CoV-2 RNA is generally detectable in upper respiratory specimens during the acute phase of infection. The lowest concentration of SARS-CoV-2 viral copies this assay can detect is 138 copies/mL. A negative result does not  preclude SARS-Cov-2 infection and should not be used as the sole basis for treatment or other patient management decisions. A negative result may occur with  improper specimen collection/handling, submission of specimen other than nasopharyngeal swab, presence of viral mutation(s) within the areas targeted by this assay, and inadequate number of viral copies(<138 copies/mL). A negative result must be combined with clinical observations, patient history, and epidemiological information. The expected result is Negative.  Fact Sheet for Patients:  EntrepreneurPulse.com.au  Fact Sheet for Healthcare Providers:  IncredibleEmployment.be  This test is no t yet approved or cleared by the [place] and  has been authorized for detection and/or diagnosis of SARS-CoV-2 by FDA under an Emergency Use Authorization (EUA). This EUA will remain  in effect (meaning this test can be used) for the duration of the COVID-19 declaration under Section 564(b)(1) of the Act, 21 U.S.C.section 360bbb-3(b)(1), unless the authorization is terminated  or revoked sooner.       Influenza A by PCR NEGATIVE NEGATIVE Final   Influenza B by PCR NEGATIVE NEGATIVE Final    Comment: (NOTE) The Xpert Xpress SARS-CoV-2/FLU/RSV plus assay is intended as an aid in the diagnosis of influenza from Nasopharyngeal swab specimens and should not be used as a sole basis for treatment. Nasal washings and aspirates are unacceptable for Xpert Xpress SARS-CoV-2/FLU/RSV testing.  Fact Sheet for Patients: EntrepreneurPulse.com.au  Fact Sheet for Healthcare Providers: IncredibleEmployment.be  This test is not yet approved or cleared by the [place] FDA and has been authorized for detection and/or diagnosis of SARS-CoV-2 by FDA under an Emergency Use Authorization (EUA). This EUA will remain in effect (meaning this test can be used) for the  duration of the COVID-19 declaration under Section 564(b)(1) of the Act, 21 U.S.C. section 360bbb-3(b)(1), unless the authorization is terminated or revoked.  Performed at [facility], [place] [address] [place], [place] [address]   Culture, blood (routine x 2)     Status: None (Preliminary result)   Collection Time: 06/22/21  2:38 PM   Specimen: BLOOD  Result Value Ref Range Status   Specimen Description   Final    BLOOD SITE NOT SPECIFIED Performed at [place] [address] [place], [place] [address]    Special Requests   Final    BOTTLES DRAWN AEROBIC AND ANAEROBIC Blood Culture results may not be optimal due to an inadequate volume of blood received in culture bottles Performed at [facility] [address] [place], [place] [address]    Culture   Final    NO GROWTH < 24 HOURS Performed at [place] [address], [place], [place] [address]    Report Status PENDING  Incomplete  Culture, blood (routine x 2)     Status: None (Preliminary result)   Collection Time: 06/22/21  2:38 PM   Specimen: BLOOD  Result Value Ref Range Status   Specimen Description   Final    BLOOD SITE NOT SPECIFIED Performed at [place] [address] [place], [place] [address]    Special Requests   Final    BOTTLES DRAWN AEROBIC AND ANAEROBIC Blood Culture adequate volume Performed at [facility] [address], [place], [place] [address]    Culture   Final    NO GROWTH < 24 HOURS Performed at [facility] [address], [place], [place] [address]    Report Status PENDING  Incomplete    Procedures and diagnostic studies:  MR 3D Recon At Scanner  Result Date: 06/23/2021 CLINICAL DATA:  Obstructive jaundice and leukocytosis. Intrahepatic biliary ductal dilatation with concern for centrally obstructing biliary mass on recent CT. Planned for ERCP this evening. EXAM: MRI  ABDOMEN WITHOUT AND WITH CONTRAST (INCLUDING MRCP) TECHNIQUE: Multiplanar multisequence MR imaging of the abdomen was performed both before and after the administration of intravenous contrast. Heavily T2-weighted images of the biliary and pancreatic ducts were obtained, and three-dimensional MRCP images were rendered by post processing. CONTRAST:  81m GADAVIST GADOBUTROL 1 MMOL/ML IV SOLN COMPARISON:  06/11/2021 CT abdomen/pelvis. FINDINGS: Lower chest: No acute abnormality at the lung bases. Hepatobiliary: Normal liver size and configuration. No hepatic steatosis. There is a 1.0 cm peripheral right liver dome hemangioma (series 26/image 26) with progressive discontinuous peripheral nodular enhancement. A few small lobulated and otherwise simple liver cysts are scattered in anterior left liver, largest 1.3 cm in the segment 4 left liver (series 5/image 18). There is marked diffuse intrahepatic biliary ductal dilatation with abrupt caliber transition at the biliary hilum. Suggestion of a poorly marginated enhancing obstructing 2.1 x 1.4 cm soft tissue mass at the biliary hilum (series 5/image 16) with associated mildly restricted diffusion on series 3/image 52. Common bile duct diameter 3 mm. No choledocholithiasis. Gallbladder is contracted with no gallstones. No definite gallbladder wall thickening accounting for contracted state. Pancreas: No pancreatic mass or duct dilation.  No pancreas divisum. Spleen: Normal size. No mass. Adrenals/Urinary Tract: Normal adrenals. No hydronephrosis. Scattered simple bilateral renal cortical cysts, largest 4.6 cm in the posterior lower right kidney. No suspicious renal masses. Stomach/Bowel: Normal non-distended stomach. Visualized small and large bowel is normal caliber, with no bowel wall thickening. Vascular/Lymphatic: Atherosclerotic nonaneurysmal abdominal aorta. Patent portal, splenic, hepatic and renal veins. Mildly enlarged 1.4 cm short axis diameter additional porta  hepatis node (series 5/image 19). No pathologically enlarged lymph nodes in the abdomen. Other: No abdominal ascites or focal fluid collection. Musculoskeletal: No aggressive appearing focal osseous lesions. IMPRESSION: 1. Marked diffuse intrahepatic biliary ductal dilatation with abrupt caliber transition at the biliary hilum. Suggestion of a poorly marginated enhancing 2.1 x 1.4 cm soft tissue mass at the biliary hilum with associated mildly restricted diffusion. 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 ERCP with brush biopsy correlation already planned by report. 2. Mild porta hepatis lymphadenopathy, indeterminate for metastatic disease. Electronically Signed   By: JIlona SorrelM.D.   On: 06/23/2021 10:48   MR ABDOMEN MRCP W WO CONTAST  Result Date: 06/23/2021 CLINICAL DATA:  Obstructive jaundice and leukocytosis. Intrahepatic biliary ductal dilatation with concern for centrally obstructing biliary mass on recent CT. Planned for ERCP this evening. EXAM: MRI ABDOMEN WITHOUT AND WITH CONTRAST (INCLUDING MRCP) TECHNIQUE: Multiplanar multisequence MR imaging of the abdomen was performed both before and after the administration of intravenous contrast. Heavily T2-weighted images of the biliary and pancreatic ducts were obtained, and three-dimensional MRCP images were rendered by post processing. CONTRAST:  720mGADAVIST GADOBUTROL 1 MMOL/ML IV SOLN COMPARISON:  06/11/2021 CT abdomen/pelvis. FINDINGS: Lower chest: No acute abnormality at the lung bases. Hepatobiliary: Normal liver size and configuration. No hepatic steatosis. There is a 1.0 cm peripheral right liver dome hemangioma (series 26/image 26) with progressive discontinuous peripheral nodular enhancement. A few small lobulated and otherwise simple liver cysts are scattered in anterior left liver, largest 1.3 cm in the segment 4 left liver (series 5/image 18). There is marked diffuse intrahepatic biliary ductal  dilatation with abrupt caliber transition at the biliary hilum. Suggestion of a poorly marginated enhancing obstructing 2.1 x 1.4 cm soft tissue mass at the biliary hilum (series 5/image 16) with associated mildly restricted diffusion on series 3/image 52. Common bile duct diameter 3 mm. No choledocholithiasis. Gallbladder is contracted with no gallstones. No definite gallbladder wall thickening accounting for contracted state. Pancreas: No pancreatic mass or duct dilation.  No pancreas divisum. Spleen: Normal size. No mass. Adrenals/Urinary Tract: Normal adrenals. No hydronephrosis. Scattered simple bilateral renal cortical cysts, largest 4.6 cm  in the posterior lower right kidney. No suspicious renal masses. Stomach/Bowel: Normal non-distended stomach. Visualized small and large bowel is normal caliber, with no bowel wall thickening. Vascular/Lymphatic: Atherosclerotic nonaneurysmal abdominal aorta. Patent portal, splenic, hepatic and renal veins. Mildly enlarged 1.4 cm short axis diameter additional porta hepatis node (series 5/image 19). No pathologically enlarged lymph nodes in the abdomen. Other: No abdominal ascites or focal fluid collection. Musculoskeletal: No aggressive appearing focal osseous lesions. IMPRESSION: 1. Marked diffuse intrahepatic biliary ductal dilatation with abrupt caliber transition at the biliary hilum. Suggestion of a poorly marginated enhancing 2.1 x 1.4 cm soft tissue mass at the biliary hilum with associated mildly restricted diffusion. 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 ERCP with brush biopsy correlation already planned by report. 2. Mild porta hepatis lymphadenopathy, indeterminate for metastatic disease. Electronically Signed   By: Ilona Sorrel M.D.   On: 06/23/2021 10:48    Medications:    aspirin  325 mg Oral Daily   buPROPion  150 mg Oral Daily   enoxaparin (LOVENOX) injection  40 mg Subcutaneous Q24H   pantoprazole  40  mg Oral Daily   senna-docusate  2 tablet Oral Daily   traZODone  100 mg Oral QHS   Continuous Infusions:  0.9 % NaCl with KCl 20 mEq / L 125 mL/hr at 06/23/21 0434   ceFEPime (MAXIPIME) IV 2 g (06/23/21 3825)   metronidazole       LOS: 1 day   Geradine Girt  [facility]   How to contact the [facility] Attending or Consulting provider [place] or covering provider during after hours [place], for this patient?  Check the care team in [facility] and look for a) attending/consulting [facility] provider listed and b) the [facility] team listed Log into [email or web address] and use [place]'s universal password to access. If you do not have the password, please contact the hospital operator. Locate the [facility] provider you are looking for under [facility] and page to a number that you can be directly reached. If you still have difficulty reaching the provider, please page the [facility] (Director on Call) for the Hospitalists listed on amion for assistance.  06/23/2021, 10:51 AM

## 2021-06-23 NOTE — Progress Notes (Signed)
    Progress Note   Subjective  Chief Complaint: Obstructive jaundice and leukocytosis, abnormal CT suggesting evidence of a mass/lesion in the bifurcation of the biliary tree concerning for potential cholangiocarcinoma  This morning the patient is doing well, denies any new complaints or concerns overnight other than that he has "cottonmouth", he wonders if he can have anything to drink or eat today.   Objective   Vital signs in last 24 hours: Temp:  [97.9 F (36.6 C)-98.5 F (36.9 C)] 97.9 F (36.6 C) (07/19 0731) Pulse Rate:  [61-89] 66 (07/19 0800) Resp:  [15-20] 18 (07/19 0800) BP: (93-131)/(48-83) 125/55 (07/19 0800) SpO2:  [95 %-100 %] 99 % (07/19 0800) Weight:  [65.3 kg] 65.3 kg (07/18 1204)   General:    white male in NAD Heart:  Regular rate and rhythm; no murmurs Lungs: Respirations even and unlabored, lungs CTA bilaterally Abdomen:  Soft, mild RUQ ttp and nondistended. Normal bowel sounds. Psych:  Cooperative. Normal mood and affect.  Intake/Output from previous day: 07/18 0701 - 07/19 0700 In: 1280 [IV Piggyback:1280] Out: -    Lab Results: Recent Labs    06/22/21 1312 06/23/21 0435  WBC 14.3* 13.0*  HGB 11.3* 10.1*  HCT 33.5* 29.7*  PLT 562* 439*   BMET Recent Labs    06/22/21 1312 06/23/21 0435  NA 127* 129*  K 3.1* 3.4*  CL 90* 95*  CO2 25 24  GLUCOSE 85 67*  BUN 27* 25*  CREATININE 0.57* 0.44*  CALCIUM 9.5 9.2   LFT Recent Labs    06/23/21 0435  PROT 6.7  ALBUMIN 2.2*  AST 325*  ALT 340*  ALKPHOS 1,911*  BILITOT 23.5*   PT/INR Recent Labs    06/22/21 1312  LABPROT 20.9*  INR 1.8*     Assessment / Plan:   Assessment: 1.  Elevated LFTs: Imaging question cholangiocarcinoma, leukocytosis suggesting ascending cholangitis 2.  Painless jaundice 3.  Leukocytosis 4.  Hypokalemia: Improving  Plan: 1.  Discussed with MRI team that MRCP will be done within the next hour. 2.  Plans are to proceed with ERCP this evening likely  around 5:00 PM.  This will be completed with Dr. Rush Landmark for decompression and bile duct brushings for cytology. 3.  Patient was asking for clear liquids this morning.  Allowed him clear liquids until 11:00 after discussion with anesthesiology that he needs to be n.p.o. for 6 hours. 4.  We will continue to supplement with potassium as this is not quite normal yet. 5.  Continue antibiotics and other supportive measures.  Thank you for your kind consultation.  We will continue to follow.     LOS: 1 day   Ruben Chavez  06/23/2021, 9:30 AM

## 2021-06-23 NOTE — Progress Notes (Signed)
Assisted Arthuro in filling out an advance directive.  We will attempt to find a notary, but typically that is more often done on the units once a patient is admitted.  I reassured him that his wife, whom he wanted to assign as his proxy, was already the person who would make the decisions because she is his next of kin.    [place], [place] Pager, [phone] 12:22 PM

## 2021-06-23 NOTE — Transfer of Care (Signed)
Immediate Anesthesia Transfer of Care Note  Patient: Ruben Chavez  Procedure(s) Performed: ENDOSCOPIC RETROGRADE CHOLANGIOPANCREATOGRAPHY (ERCP) SPHINCTEROTOMY BILIARY BRUSHING  Patient Location: PACU  Anesthesia Type:General  Level of Consciousness: sedated  Airway & Oxygen Therapy: Patient Spontanous Breathing and Patient connected to face mask oxygen  Post-op Assessment: Report given to RN and Post -op Vital signs reviewed and stable  Post vital signs: Reviewed and stable  Last Vitals:  Vitals Value Taken Time  BP 144/66 06/23/21 1735  Temp    Pulse 67 06/23/21 1737  Resp 12 06/23/21 1737  SpO2 100 % 06/23/21 1737  Vitals shown include unvalidated device data.  Last Pain:  Vitals:   06/23/21 1355  TempSrc: Oral  PainSc: 0-No pain         Complications: No notable events documented.

## 2021-06-23 NOTE — Anesthesia Procedure Notes (Signed)
Procedure Name: Intubation Date/Time: 06/23/2021 3:28 PM Performed by: Talbot Grumbling, CRNA Pre-anesthesia Checklist: Patient identified, Emergency Drugs available, Patient being monitored and Suction available Patient Re-evaluated:Patient Re-evaluated prior to induction Oxygen Delivery Method: Circle system utilized Preoxygenation: Pre-oxygenation with 100% oxygen Induction Type: IV induction Ventilation: Mask ventilation without difficulty Laryngoscope Size: Mac and 3 Grade View: Grade II Tube type: Oral Tube size: 7.5 mm Number of attempts: 1 Airway Equipment and Method: Stylet Placement Confirmation: ETT inserted through vocal cords under direct vision, positive ETCO2 and breath sounds checked- equal and bilateral Secured at: 21 cm Tube secured with: Tape Dental Injury: Teeth and Oropharynx as per pre-operative assessment

## 2021-06-23 NOTE — Interval H&P Note (Signed)
History and Physical Interval Note:  06/23/2021 2:58 PM  Ruben Chavez  has presented today for surgery, with the diagnosis of Abnormal LFTs and dilated bile duct.  The various methods of treatment have been discussed with the patient and family. After consideration of risks, benefits and other options for treatment, the patient has consented to  Procedure(s): ENDOSCOPIC RETROGRADE CHOLANGIOPANCREATOGRAPHY (ERCP) (N/A) as a surgical intervention.  The patient's history has been reviewed, patient examined, no change in status, stable for surgery.  I have reviewed the patient's chart and labs.  Questions were answered to the patient's satisfaction.    The risks of an ERCP were discussed at length, including but not limited to the risk of perforation, bleeding, abdominal pain, post-ERCP pancreatitis (while usually mild can be severe and even life threatening).    [employer]

## 2021-06-23 NOTE — Anesthesia Preprocedure Evaluation (Addendum)
Anesthesia Evaluation  Patient identified by MRN, date of birth, ID band Patient awake    Reviewed: Allergy & Precautions, NPO status , Patient's Chart, lab work & pertinent test results  History of Anesthesia Complications Negative for: history of anesthetic complications  Airway Mallampati: II  TM Distance: >3 FB Neck ROM: Full    Dental  (+) Dental Advisory Given, Implants   Pulmonary former smoker,    Pulmonary exam normal        Cardiovascular hypertension, Pt. on medications Normal cardiovascular exam   '21 TEE - EF 60 to 65%. Trivial MR    Neuro/Psych CVA, Residual Symptoms negative psych ROS   GI/Hepatic negative GI ROS, (+) Hepatitis -  Endo/Other   Na 129 K 3.4 Cl 95   Renal/GU negative Renal ROS    Prostate cancer     Musculoskeletal  (+) Arthritis , Osteoarthritis,    Abdominal   Peds  Hematology  (+) anemia ,  INR 1.8    Anesthesia Other Findings   Reproductive/Obstetrics                            Anesthesia Physical Anesthesia Plan  ASA: 3  Anesthesia Plan: General   Post-op Pain Management:    Induction: Intravenous  PONV Risk Score and Plan: 2 and Treatment may vary due to age or medical condition, Ondansetron and Dexamethasone  Airway Management Planned: Oral ETT  Additional Equipment: None  Intra-op Plan:   Post-operative Plan: Extubation in OR  Informed Consent: I have reviewed the patients History and Physical, chart, labs and discussed the procedure including the risks, benefits and alternatives for the proposed anesthesia with the patient or authorized representative who has indicated his/her understanding and acceptance.     Dental advisory given  Plan Discussed with: CRNA and Anesthesiologist  Anesthesia Plan Comments:        Anesthesia Quick Evaluation

## 2021-06-23 NOTE — ED Notes (Signed)
Pt back from MRI 

## 2021-06-23 NOTE — Op Note (Signed)
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 Tilden Dome, RN,                            Laverda Sorenson, Technician, Marla Roe, CRNA Referring MD:             Pricilla Riffle. Fuller Plan, MD, Alonza Bogus PA, PA, Erline Hau Medicines:                General Anesthesia, Glucagon 1 mg IV, Antibiotics                            not administered since given earlier in day,                            Indomethacin not administered due to NSAID allergy Complications:            No immediate complications. Estimated Blood Loss:     Estimated blood loss was minimal. Procedure:                Pre-Anesthesia Assessment:                           - Prior to the procedure, a History and Physical                            was performed, and patient medications and                            allergies were reviewed. The patient's tolerance of                            previous anesthesia was also reviewed. The risks                            and benefits of the procedure and the sedation  options and risks were discussed with the patient.                            All questions were answered, and informed consent                            was obtained. Prior Anticoagulants: The patient has                            taken Lovenox (enoxaparin), last dose was day of                            procedure.  ASA Grade Assessment: III - A patient                            with severe systemic disease. After reviewing the                            risks and benefits, the patient was deemed in                            satisfactory condition to undergo the procedure.                           After obtaining informed consent, the scope was                            passed under direct vision. Throughout the                            procedure, the patient's blood pressure, pulse, and                            oxygen saturations were monitored continuously. The                            [id number]-H190 ([id number]) Olympus gastroscope was                            introduced through the mouth, and used to inject                            contrast into and used to locate the major papilla.                            The Olympus [id number]) was introduced                            through the mouth, and used to inject contrast into                            and used to inject contrast into the bile duct. The  ERCP was technically difficult and complex due to                            difficulty passing guidewires through biliary                            ductal stenosis. Successful completion of the                            procedure was aided by performing the maneuvers                            documented (below) in this report. The patient                            tolerated the procedure. Scope In: Scope Out: Findings:      The scout film was normal.      The esophagus was successfully intubated under direct vision without       detailed examination of the pharynx, larynx, and associated structures.       A standard esophagogastroduodenoscopy scope was used for the examination       of the upper gastrointestinal tract. The scope was passed under direct       vision through the upper GI tract. No gross lesions were noted in the       entire esophagus.  The Z-line was irregular and was found 43 cm from the       incisors. Diffuse moderate inflammation characterized by erosions,       erythema and friability was found in the entire examined stomach -       biopsied for HP evaluation. No gross lesions were noted in the duodenal       bulb, in the first portion of the duodenum and in the second portion of       the duodenum. The major papilla was adjacent to a diverticulum but       otherwise normal.      Repeated attempts at biliary cannulation were not successful while using       a wire-guided approach. This led to placement of the wire within the       pancreatic duct on 2 occasions. Decision was made to pursue a       double-wire approach. The wire was left within the pancreatic duct.      A second short 0.035 inch Soft Jagwire was passed into the biliary tree       after insinuation of the sphincterotome and significant bowing within       the ampullary orifice. The Hydratome sphincterotome was then passed over       the guidewire and the bile duct was then deeply cannulated. Contrast was       injected. I personally interpreted the bile duct images. Ductal flow of       contrast was adequate. Image quality was adequate. Contrast extended to       the bifurcation. Opacification of the entire biliary tree except for the       cystic duct and gallbladder was successful. The lower third of the main       bile duct and middle third of the main bile duct were normal. The upper  third of the main bile duct and hepatic duct bifurcation were partially       obstructed by what appeared to be a mass. The left main hepatic duct was       moderately dilated. I could not visualize the right hepatic duct. A 6 mm       biliary sphincterotomy was made with a monofilament Hydratome       sphincterotome using ERBE electrocautery. There was no       post-sphincterotomy bleeding. To discover objects, the biliary tree was       swept with a retrieval  balloon starting at the bifurcation. Sludge was       swept from the duct. Dilation of the common bile duct       stricturing/narrowing at the CHD/Bifurcation with a Hurricane 4 mm       balloon dilator was successful. Cells for cytology were obtained by       brushing in the upper third of the main bile duct and the hepatic duct       bifurcation (2 sets were obtained). One 8.5 Fr by 12 cm transpapillary       plastic biliary stent with a single external flap and a single internal       flap was placed into the left hepatic duct. Bile and contrast flowed       through the stent. The stent was positioned too far downstream however       (towards the lumen of the GI tract). This stent was removed using a       Raptor grasping device. In so doing, I lost the pancreatic wire.      Subsequently, a short 0.035 inch Soft Jagwire was passed into the       biliary tree. I could not get the wire to go into the right system       however. One 7 Fr by 9 cm transpapillary plastic biliary stent with a       single external flap and a single internal flap was placed into the left       hepatic duct. Bile flowed through the stent. The stent was in good       position.      With inability to give Indomethacine, I decided to try and cannulate the       pancreatic tree again. With the first attempt at a 6 o'clock angle, a       short 0.035 inch Soft Jagwire passed successfully into the pancreatic       duct into the genu of the pancreas. A pancreatogram was not performed.       One 4 Fr by 5 cm temporary plastic pancreatic stent with a single       external pigtail was placed into the ventral pancreatic duct. The stent       was in good position.      The duodenoscope was withdrawn from the patient. Impression:               - No gross lesions in esophagus. Z-line irregular,                            43 cm from the incisors.                           - Gastritis. Biopsied.                           -  No  gross lesions in the duodenal bulb, in the                            first portion of the duodenum and in the second                            portion of the duodenum.                           - The major papilla was adjacent to a diverticulum                            but otherwise normal.                           - A biliary tract obstruction secondary to what                            appeared to be a mass was found in the                            CHD/Bifurcation of the right and left hepatic ducts.                           - The left main hepatic duct was moderately                            dilated. I could not visualize or get the right                            hepatic system to fill with contrast even under                            balloon injection.                           - A biliary sphincterotomy was performed.                           - The biliary tree was swept and sludge was found.                           - The stricture was dilated and then cytology was                            obtained via brushings (x2 sets).                           - Initial plastic biliary stent was too long and                            had to be removed. This was then replaced with a 9  cm by 7 Fr stent that traversed into the left                            hepatic system.                           - One temporary plastic pancreatic stent was placed                            into the ventral pancreatic duct to decrease PEP. Moderate Sedation:      Not Applicable - Patient had care per Anesthesia. Recommendation:           - The patient will be observed post-procedure,                            until all discharge criteria are met.                           - Return patient to hospital ward for ongoing care.                           - Observe patient's clinical course.                           - Await cytology results and await path results.                            - Check liver enzymes (AST, ALT, alkaline                            phosphatase, bilirubin) in the morning. It will                            take up to 2-3 weeks to see if we can normalize the                            bilirubin with the current stent. The patient may                            end up need PBD drainage if he does not normalize.                           - Patient will need a KUB 2-view in 10-14 days to                            ensure pancreatic stent has migrated successfully.                            If still present at that time will need to be                            scheduled for EGD with stent pull.                           -  Return to this GI lab for stent exchange at ERCP                            in 3 months.                           - Watch for pancreatitis, bleeding, perforation,                            and cholangitis.                           - The findings and recommendations were discussed                            with the patient.                           - The findings and recommendations were discussed                            with the patient's family. Procedure Code(s):        --- Professional ---                           [phone], Endoscopic retrograde                            cholangiopancreatography (ERCP); with removal and                            exchange of stent(s), biliary or pancreatic duct,                            including pre- and post-dilation and guide wire                            passage, when performed, including sphincterotomy,                            when performed, each stent exchanged                           43276, 75, Endoscopic retrograde                            cholangiopancreatography (ERCP); with removal and                            exchange of stent(s), biliary or pancreatic duct,                            including pre- and post-dilation and guide wire                             passage, when performed, including sphincterotomy,  when performed, each stent exchanged                           43264, Endoscopic retrograde                            cholangiopancreatography (ERCP); with removal of                            calculi/debris from biliary/pancreatic duct(s) Diagnosis Code(s):        --- Professional ---                           K22.8, Other specified diseases of esophagus                           K29.70, Gastritis, unspecified, without bleeding                           K83.1, Obstruction of bile duct                           Z46.59, Encounter for fitting and adjustment of                            other gastrointestinal appliance and device                           R17, Unspecified jaundice                           R94.5, Abnormal results of liver function studies                           D49.0, Neoplasm of unspecified behavior of                            digestive system                           K83.8, Other specified diseases of biliary tract                           R93.5, Abnormal findings on diagnostic imaging of                            other abdominal regions, including retroperitoneum                           R93.2, Abnormal findings on diagnostic imaging of                            liver and biliary tract CPT copyright 2019 [employer]. All rights reserved. The codes documented in this report are preliminary and upon coder review may  be revised to meet current compliance requirements. Justice Britain, MD 06/23/2021 10:12:18 PM Number of Addenda: 0

## 2021-06-24 ENCOUNTER — Encounter (HOSPITAL_COMMUNITY): Payer: Self-pay | Admitting: Gastroenterology

## 2021-06-24 DIAGNOSIS — E785 Hyperlipidemia, unspecified: Secondary | ICD-10-CM | POA: Diagnosis not present

## 2021-06-24 DIAGNOSIS — I10 Essential (primary) hypertension: Secondary | ICD-10-CM | POA: Diagnosis not present

## 2021-06-24 DIAGNOSIS — C240 Malignant neoplasm of extrahepatic bile duct: Secondary | ICD-10-CM

## 2021-06-24 DIAGNOSIS — C801 Malignant (primary) neoplasm, unspecified: Secondary | ICD-10-CM

## 2021-06-24 DIAGNOSIS — K831 Obstruction of bile duct: Secondary | ICD-10-CM | POA: Diagnosis not present

## 2021-06-24 DIAGNOSIS — R933 Abnormal findings on diagnostic imaging of other parts of digestive tract: Secondary | ICD-10-CM

## 2021-06-24 DIAGNOSIS — I63519 Cerebral infarction due to unspecified occlusion or stenosis of unspecified middle cerebral artery: Secondary | ICD-10-CM

## 2021-06-24 DIAGNOSIS — Z9889 Other specified postprocedural states: Secondary | ICD-10-CM

## 2021-06-24 DIAGNOSIS — E440 Moderate protein-calorie malnutrition: Secondary | ICD-10-CM | POA: Insufficient documentation

## 2021-06-24 LAB — COMPREHENSIVE METABOLIC PANEL
ALT: 279 U/L — ABNORMAL HIGH (ref 0–44)
AST: 236 U/L — ABNORMAL HIGH (ref 15–41)
Albumin: 2 g/dL — ABNORMAL LOW (ref 3.5–5.0)
Alkaline Phosphatase: 966 U/L — ABNORMAL HIGH (ref 38–126)
Anion gap: 12 (ref 5–15)
BUN: 28 mg/dL — ABNORMAL HIGH (ref 8–23)
CO2: 19 mmol/L — ABNORMAL LOW (ref 22–32)
Calcium: 9.1 mg/dL (ref 8.9–10.3)
Chloride: 98 mmol/L (ref 98–111)
Creatinine, Ser: 0.66 mg/dL (ref 0.61–1.24)
GFR, Estimated: >60 mL/min (ref >60)
Glucose, Bld: 122 mg/dL — ABNORMAL HIGH (ref 70–99)
Potassium: 4.2 mmol/L (ref 3.5–5.1)
Sodium: 129 mmol/L — ABNORMAL LOW (ref 135–145)
Total Bilirubin: 20.4 mg/dL (ref 0.3–1.2)
Total Protein: 6.4 g/dL — ABNORMAL LOW (ref 6.5–8.1)

## 2021-06-24 LAB — CUP PACEART REMOTE DEVICE CHECK
Date Time Interrogation Session: 20220719230535
Implantable Pulse Generator Implant Date: 20210222

## 2021-06-24 LAB — CBC
HCT: 29.4 % — ABNORMAL LOW (ref 39.0–52.0)
Hemoglobin: 9.9 g/dL — ABNORMAL LOW (ref 13.0–17.0)
MCH: 30.7 pg (ref 26.0–34.0)
MCHC: 33.7 g/dL (ref 30.0–36.0)
MCV: 91 fL (ref 80.0–100.0)
Platelets: 457 10*3/uL — ABNORMAL HIGH (ref 150–400)
RBC: 3.23 MIL/uL — ABNORMAL LOW (ref 4.22–5.81)
RDW: 17 % — ABNORMAL HIGH (ref 11.5–15.5)
WBC: 15.7 10*3/uL — ABNORMAL HIGH (ref 4.0–10.5)
nRBC: 0 % (ref 0.0–0.2)

## 2021-06-24 LAB — BILIRUBIN, DIRECT: Bilirubin, Direct: 10.7 mg/dL — ABNORMAL HIGH (ref 0.0–0.2)

## 2021-06-24 MED ORDER — SODIUM BICARBONATE 650 MG PO TABS
1300.0000 mg | ORAL_TABLET | Freq: Three times a day (TID) | ORAL | Status: DC
  Administered 2021-06-24 – 2021-06-26 (×6): 1300 mg via ORAL
  Filled 2021-06-24 (×6): qty 2

## 2021-06-24 MED ORDER — ASPIRIN EC 81 MG PO TBEC
81.0000 mg | DELAYED_RELEASE_TABLET | Freq: Every day | ORAL | Status: DC
  Administered 2021-06-25 – 2021-06-26 (×2): 81 mg via ORAL
  Filled 2021-06-24 (×2): qty 1

## 2021-06-24 MED ORDER — ADULT MULTIVITAMIN W/MINERALS CH
1.0000 | ORAL_TABLET | Freq: Every day | ORAL | Status: DC
  Administered 2021-06-24 – 2021-06-26 (×3): 1 via ORAL
  Filled 2021-06-24 (×3): qty 1

## 2021-06-24 MED ORDER — RESOURCE INSTANT PROTEIN PO PWD PACKET
1.0000 | Freq: Three times a day (TID) | ORAL | Status: DC
  Filled 2021-06-24: qty 6

## 2021-06-24 NOTE — Progress Notes (Signed)
PROGRESS NOTE    MARTESE VANATTA  MPN:[id number] DOB: 1951-04-23 DOA: 06/22/2021 PCP: Isaac Bliss, Rayford Halsted, MD    Brief Narrative:  Mr. Brensinger was admitted to the hospital with the working diagnosis of obstructive jaundice, due to Klatskin tumor.   70 year old male past medical history for hypertension and remote CVA who presented with jaundice.  Reported 1 month of worsening painless jaundice.  Over the last 2 weeks prior to hospitalization he developed extreme generalized weakness and poor appetite that prompted him to come to the hospital. On his initial physical examination blood pressure 112/52, heart rate 71, respiratory rate 20, oxygen saturation 95%, positive icterus, lungs clear to auscultation, heart S1-S2, present, rhythmic, abdomen soft, no organomegaly, nontender, no lower extremity edema.  Sodium 127, potassium 3.1, chloride 90, bicarb 25, glucose 85, BUN 27, creatinine 0.57, magnesium 2.8, alkaline phosphatase 2274, lipase 40, AST 420, ALT 436, total bilirubin is 28.1, ammonia 65.  White count 14.3, hemoglobin 11.3, hematocrit 33.5, platelets 562. SARS COVID-19 negative.  Urinalysis Pacific gravity 1.023, 30 protein, 0-5 white cells. Serum osmolality 620, urine sodium 23.  MRCP with diffuse intrahepatic biliary duct limitation with abrupt caliber transition at the biliary hilum.  Suggesting 2.1 x 1.4 cm soft tissue mass at the biliary hilum.  Suspicious for hilar cholangiocarcinoma. (Klatskin tumor).   07/19 ERCP with biliary tract obstruction secondary to mass in the common hepatic duct-bifurcation of the right and left hepatic ducts. Performed biliary sphincterectomy, stricture dilatation, and plastic biliary stent was placed (traversed into the left hepatic system).   One temporary plastic pancreatic stent was placed into the ventral pancreatic duct. Brushings were obtained for cytology.  Assessment & Plan:   Principal Problem:   Obstructive jaundice due to  malignant neoplasm [facility]) Active Problems:   Essential hypertension   Hyperlipidemia   Stroke (HCC)   Bile duct obstruction   Klatskin's tumor (HCC)  Obstructive jaundice, common hepatic duct obstruction, Klatskin tumor.  Patient sp ERCP and stent placement biliary duct and pancreatic duct. Pathology still pending. Patient tolerating well full liquid diet, now advanced to heart healthy.  Wbc is 15,7 patient has been afebrile.  ALK down to 966, AST 236 and ALT 279. T Bil 20.4 Direct 10.7  Continue supportive medical care, will liberate his diet for now, and consult nutrition. Continue pain control with morphine and antiemetics to zofran.  No clinical sings of cholangitis, will continue antibiotic for 24 hrs more hours, if no clinical signs of infection with plan to dc antibiotic therapy. Continue with metronidazole and cefepime for now.  Ok to discontinue telemetry.   2. HTN. Blood pressure has ben stable, today is 116/69 mmHg. Continue close monitoring.  Continue to hold on lisinopril and hctz for now.   3. CVA hx. Continue asa, holding on statin until improvement in LFTs.  Decrease dose of aspirin 81 mg  4. Depression/ insomnia. Continue with trazodone and wellbutrin.   5. Hyponatremia and hypokalemia/ non anion gap metabolic acidosis. Na continue to be low down to 129 with K at 4,2 and serum bicarbonate at 19. High urine osmolality, clinically patient is euvolemic, urine Na is low.  Suspected SIADH. Add sodium bicarbonate tid, liberate diet and discontinue IV fluids. Follow up renal function and electrolytes in am.   Patient continue to be at high risk for worsening obstructive jaundice   Status is: Inpatient  Remains inpatient appropriate because:Inpatient level of care appropriate due to severity of illness  Dispo: The patient is from: Home  Anticipated d/c is to: Home              Patient currently is not medically stable to d/c.   Difficult to place  patient No   DVT prophylaxis: Enoxaparin   Code Status:   full  Family Communication:  No family at the bedside      Consultants:  GI   Procedures:  ERCP (pancreatic and biliary stents)  Antimicrobials:  Cefepime and metronidazole     Subjective: Patient is feeling better, but continue to be weak and deconditioned, jaundice is improving but not yet back to baseline.   Objective: Vitals:   06/23/21 1837 06/23/21 2223 06/24/21 0153 06/24/21 0518  BP: (!) 153/76 136/81 103/61 116/69  Pulse: 66 73 74 67  Resp: 18 18 18 18   Temp: 97.7 F (36.5 C) 97.8 F (36.6 C) 97.6 F (36.4 C) 97.7 F (36.5 C)  TempSrc: Oral Oral Axillary Oral  SpO2: 100% 98% 95% 98%  Weight:      Height:        Intake/Output Summary (Last 24 hours) at 06/24/2021 1139 Last data filed at 06/24/2021 0725 Gross per 24 hour  Intake 2325.89 ml  Output 170 ml  Net 2155.89 ml   Filed Weights   06/22/21 1204 06/23/21 1355  Weight: 65.3 kg 65.3 kg    Examination:   General: Not in pain or dyspnea. Deconditioned  Neurology: Awake and alert, non focal  E ENT: positive pallor, positive icterus, oral mucosa moist Cardiovascular: No JVD. S1-S2 present, rhythmic, no gallops, rubs, or murmurs. No lower extremity edema. Pulmonary: positive breath sounds bilaterally, adequate air movement, no wheezing, rhonchi or rales. Gastrointestinal. Abdomen soft and non tender Skin. No rashes Musculoskeletal: no joint deformities     Data Reviewed: I have personally reviewed following labs and imaging studies  CBC: Recent Labs  Lab 06/19/21 1425 06/22/21 1312 06/23/21 0435 06/24/21 0937  WBC 15.7* 14.3* 13.0* 15.7*  NEUTROABS  --  12.6*  --   --   HGB 10.6* 11.3* 10.1* 9.9*  HCT 31.0* 33.5* 29.7* 29.4*  MCV 88.7 87.9 88.4 91.0  PLT 488.0* 562* 439* 263*   Basic Metabolic Panel: Recent Labs  Lab 06/19/21 1425 06/22/21 1312 06/23/21 0435 06/24/21 0433  NA 124* 127* 129* 129*  K 3.0* 3.1* 3.4* 4.2   CL 90* 90* 95* 98  CO2 25 25 24  19*  GLUCOSE 110* 85 67* 122*  BUN 24* 27* 25* 28*  CREATININE 1.18 0.57* 0.44* 0.66  CALCIUM 9.0 9.5 9.2 9.1  MG  --  2.8*  --   --    GFR: Estimated Creatinine Clearance: 78.2 mL/min (by C-G formula based on SCr of 0.66 mg/dL). Liver Function Tests: Recent Labs  Lab 06/19/21 1425 06/22/21 1312 06/23/21 0435 06/24/21 0433  AST 417* 420* 325* 236*  ALT 476* 436* 340* 279*  ALKPHOS 2,495* 2,274* 1,911* 966*  BILITOT 28.8* 28.1* 23.5* 20.4*  PROT 6.5 7.8 6.7 6.4*  ALBUMIN 3.1* 2.6* 2.2* 2.0*   Recent Labs  Lab 06/22/21 1312  LIPASE 40   Recent Labs  Lab 06/22/21 1312  AMMONIA 65*   Coagulation Profile: Recent Labs  Lab 06/19/21 1425 06/22/21 1312  INR 1.7* 1.8*   Cardiac Enzymes: No results for input(s): CKTOTAL, CKMB, CKMBINDEX, TROPONINI in the last 168 hours. BNP (last 3 results) No results for input(s): PROBNP in the last 8760 hours. HbA1C: No results for input(s): HGBA1C in the last 72 hours. CBG: No results for input(s):  GLUCAP in the last 168 hours. Lipid Profile: No results for input(s): CHOL, HDL, LDLCALC, TRIG, CHOLHDL, LDLDIRECT in the last 72 hours. Thyroid Function Tests: No results for input(s): TSH, T4TOTAL, FREET4, T3FREE, THYROIDAB in the last 72 hours. Anemia Panel: No results for input(s): VITAMINB12, FOLATE, FERRITIN, TIBC, IRON, RETICCTPCT in the last 72 hours.    Radiology Studies: I have reviewed all of the imaging during this hospital visit personally     Scheduled Meds:  aspirin  325 mg Oral Daily   buPROPion  150 mg Oral Daily   enoxaparin (LOVENOX) injection  40 mg Subcutaneous Q24H   indomethacin  100 mg Rectal Once   pantoprazole  40 mg Oral Daily   senna-docusate  2 tablet Oral Daily   traZODone  100 mg Oral QHS   Continuous Infusions:  0.9 % NaCl with KCl 20 mEq / L 125 mL/hr at 06/24/21 1024   ceFEPime (MAXIPIME) IV 2 g (06/24/21 0544)   metronidazole 500 mg (06/24/21 0620)      LOS: 2 days        Josanna Hefel Gerome Apley, MD

## 2021-06-24 NOTE — Evaluation (Signed)
Occupational Therapy Evaluation Patient Details Name: Ruben Chavez MRN: [id number] DOB: 22-Jun-1951 Today's Date: 06/24/2021    History of Present Illness Ruben Chavez is a 70 y.o. male with medical history significant for history cva with left sided hemiparesis, hypretension, who presents to the hospital with jaundice. Patient s/p ERCP 7/19.   Clinical Impression   [name] is a 70 year old man who presents with left sided hemiparesis without functional use of left hand, generalized weakness, impaired balance and decreased activity tolerance resulting in a decline in baseline independence. On evaluation patient needing min assist for transfers, min assist for ADLs and activity limited to just to the chair. Patient will benefit from skilled OT services while in hospital to improve deficits and learn compensatory strategies as needed in order to return to PLOF.  Expect patient should recover well while in hospital and return home with assistance of wife without OT needs.      Follow Up Recommendations  No OT follow up    Equipment Recommendations  None recommended by OT    Recommendations for Other Services       Precautions / Restrictions Precautions Precautions: Fall Precaution Comments: Left sided hemiparesis, L hand exhibits minimal function Restrictions Weight Bearing Restrictions: No      Mobility Bed Mobility Overal bed mobility: Needs Assistance Bed Mobility: Supine to Sit     Supine to sit: Min assist;HOB elevated     General bed mobility comments: min assist for trunk lift off to transfer to side of bed    Transfers Overall transfer level: Needs assistance Equipment used: 1 person hand held assist Transfers: Sit to/from [facility] Sit to Stand: Min assist         General transfer comment: Min assist to power up from bed. Min assist for steadying to take steps to recliner.    Balance Overall balance assessment: Needs  assistance Sitting-balance support: No upper extremity supported;Feet supported Sitting balance-Leahy Scale: Fair     Standing balance support: During functional activity Standing balance-Leahy Scale: Poor                             ADL either performed or assessed with clinical judgement   ADL Overall ADL's : Needs assistance/impaired Eating/Feeding: Set up;Sitting   Grooming: Set up;Bed level;Oral care;Wash/dry face Grooming Details (indicate cue type and reason): performed oral care seated in chair - limited by baseline limited left hand function Upper Body Bathing: Set up;Sitting   Lower Body Bathing: Minimal assistance;Sit to/from stand   Upper Body Dressing : Set up;Sitting   Lower Body Dressing: Minimal assistance;Sit to/from stand   Toilet Transfer: Minimal assistance;Stand-pivot;BSC   Toileting- Clothing Manipulation and Hygiene: Minimal assistance;Sit to/from stand       Functional mobility during ADLs: Minimal assistance       Vision Patient Visual Report: No change from baseline       Perception     Praxis      Pertinent Vitals/Pain Pain Assessment: No/denies pain     Hand Dominance Right   Extremity/Trunk Assessment Upper Extremity Assessment Upper Extremity Assessment: RUE deficits/detail;LUE deficits/detail RUE Deficits / Details: WFL ROM 5/5 strength RUE Sensation: WNL RUE Coordination: WNL LUE Deficits / Details: Left shoulder exhibits gross ROM and 4-/5 strength, 4/5 elbow strength, wrist 3-/5, fingers 2+/5 LUE Sensation: decreased light touch;decreased proprioception LUE Coordination: decreased fine motor;decreased gross motor   Lower Extremity Assessment Lower Extremity  Assessment: Defer to PT evaluation   Cervical / Trunk Assessment Cervical / Trunk Assessment: Normal   Communication Communication Communication: Other (comment) (slurred speech)   Cognition Arousal/Alertness: Awake/alert Behavior During Therapy: WFL  for tasks assessed/performed Overall Cognitive Status: Within Functional Limits for tasks assessed                                     General Comments       Exercises     Shoulder Instructions      Home Living Family/patient expects to be discharged to:: Private residence Living Arrangements: Spouse/significant other Available Help at Discharge: Family Type of Home: House Home Access: Stairs to enter Technical brewer of Steps: 1   Home Layout: 1/2 bath on main level     Bathroom Shower/Tub: Walk-in Psychologist, prison and probation services: [place]: Kasandra Knudsen - single point   Additional Comments: Stroke in Feb 2021      Prior Functioning/Environment Level of Independence: Needs assistance  Gait / Transfers Assistance Needed: cane ADL's / Homemaking Assistance Needed: mostly independent            OT Problem List: Decreased strength;Decreased range of motion;Decreased activity tolerance;Impaired balance (sitting and/or standing)      OT Treatment/Interventions: Self-care/ADL training;DME and/or AE instruction;Therapeutic activities;Balance training;Patient/family education    OT Goals(Current goals can be found in the care plan section) Acute Rehab OT Goals Patient Stated Goal: to get stronger OT Goal Formulation: With patient Time For Goal Achievement: 07/08/21 Potential to Achieve Goals: Good  OT Frequency: Min 2X/week   Barriers to D/C:            Co-evaluation              AM-PAC OT "6 Clicks" Daily Activity     Outcome Measure Help from another person eating meals?: A Little Help from another person taking care of personal grooming?: A Little Help from another person toileting, which includes using toliet, bedpan, or urinal?: A Little Help from another person bathing (including washing, rinsing, drying)?: A Little Help from another person to put on and taking off regular upper body clothing?: A Little Help from another  person to put on and taking off regular lower body clothing?: A Little 6 Click Score: 18   End of Session Nurse Communication: Mobility status  Activity Tolerance: Patient tolerated treatment well Patient left: in chair;with call bell/phone within reach  OT Visit Diagnosis: Unsteadiness on feet (R26.81);Muscle weakness (generalized) (M62.81)                Time: 9381-0175 OT Time Calculation (min): 24 min Charges:  OT General Charges $OT Visit: 1 Visit OT Evaluation $OT Eval Low Complexity: 1 Low OT Treatments $Self Care/Home Management : 8-22 mins  Jonathan Corpus, OTR/L [place] [phone] Pager: [phone]   Lenward Chancellor 06/24/2021, 12:02 PM

## 2021-06-24 NOTE — Evaluation (Signed)
Physical Therapy Evaluation Patient Details Name: Ruben Chavez MRN: [id number] DOB: 1951/02/17 Today's Date: 06/24/2021   History of Present Illness  Ruben Chavez is a 70 y.o. male with medical history significant for history cva with left sided hemiparesis, hypretension, who presents to the hospital with jaundice. Patient s/p ERCP 7/19.  Clinical Impression  Pt admitted with above diagnosis.  Pt reports independence at baseline, denies falls, denies amb with cane until just before admission. Pt with extremely unsteady gait today. Recommend HHPT vs OPPT depending on progress. Continue to follow in acute setting.  Pt currently with functional limitations due to the deficits listed below (see PT Problem List). Pt will benefit from skilled PT to increase their independence and safety with mobility to allow discharge to the venue listed below.       Follow Up Recommendations Home health PT;Outpatient PT;Supervision for mobility/OOB (vs)    Equipment Recommendations  None recommended by PT    Recommendations for Other Services       Precautions / Restrictions Precautions Precautions: Fall Precaution Comments: Left sided hemiparesis, L hand exhibits minimal function Restrictions Weight Bearing Restrictions: No      Mobility  Bed Mobility Overal bed mobility: Needs Assistance Bed Mobility: Supine to Sit     Supine to sit: Min assist;HOB elevated     General bed mobility comments: in recliner on arrival    Transfers Overall transfer level: Needs assistance Equipment used: None Transfers: Sit to/from Stand Sit to Stand: Min assist         General transfer comment: cues for R hand placement. min assist to power up from chair, 2 attempts to come to full stand  Ambulation/Gait Ambulation/Gait assistance: Min assist Gait Distance (Feet): 200 Feet Assistive device: Straight cane Gait Pattern/deviations: Step-through pattern;Decreased stride length;Decreased  dorsiflexion - right;Decreased dorsiflexion - left;Festinating;Narrow base of support     General Gait Details: pt with initially very slow gait velocity, after ~ 100' pt exhibited incr trunk flexion, incr velocity with festinating type gait. had pt stop x2, continued with festinating gait requiring assist to prevent anterior LOB  Stairs            Wheelchair Mobility    Modified Rankin (Stroke Patients Only)       Balance Overall balance assessment: Needs assistance Sitting-balance support: No upper extremity supported;Feet supported Sitting balance-Leahy Scale: Fair     Standing balance support: Single extremity supported Standing balance-Leahy Scale: Poor Standing balance comment: reliant on UE support for static. required external assist for dynamic tasks                             Pertinent Vitals/Pain Pain Assessment: No/denies pain    Home Living Family/patient expects to be discharged to:: Private residence Living Arrangements: Spouse/significant other Available Help at Discharge: Family Type of Home: House Home Access: Stairs to enter   Technical brewer of Steps: 1 Home Layout: 1/2 bath on main level Home Equipment: Kasandra Knudsen - single point Additional Comments: Stroke in Feb 2021    Prior Function Level of Independence: Needs assistance   Gait / Transfers Assistance Needed: pt reports only amb with cane since he "has been weak"  ADL's / Homemaking Assistance Needed: mostly independent per pt. wife not present        Hand Dominance        Extremity/Trunk Assessment   Upper Extremity Assessment Upper Extremity Assessment: Defer to OT evaluation RUE Deficits /  Details: WFL ROM 5/5 strength RUE Sensation: WNL RUE Coordination: WNL LUE Deficits / Details: Left shoulder exhibits gross ROM and 4-/5 strength, 4/5 elbow strength, wrist 3-/5, fingers 2+/5 LUE Sensation: decreased light touch;decreased proprioception LUE Coordination:  decreased fine motor;decreased gross motor    Lower Extremity Assessment Lower Extremity Assessment: RLE deficits/detail;LLE deficits/detail RLE Deficits / Details: grossly WFL for strength and ROM RLE Coordination: decreased gross motor LLE Deficits / Details: grossly WFL for strength and ROM LLE Coordination: decreased gross motor    Cervical / Trunk Assessment Cervical / Trunk Assessment: Normal  Communication   Communication: Other (comment) (slurred speech at times)  Cognition Arousal/Alertness: Awake/alert Behavior During Therapy: WFL for tasks assessed/performed Overall Cognitive Status: No family/caregiver present to determine baseline cognitive functioning Area of Impairment: Problem solving                             Problem Solving: Slow processing;Difficulty sequencing;Requires verbal cues        General Comments      Exercises     Assessment/Plan    PT Assessment Patient needs continued PT services  PT Problem List Decreased strength;Decreased mobility;Decreased activity tolerance;Decreased balance;Decreased coordination       PT Treatment Interventions DME instruction;Therapeutic activities;Gait training;Functional mobility training;Therapeutic exercise;Patient/family education;Balance training    PT Goals (Current goals can be found in the Care Plan section)  Acute Rehab PT Goals Patient Stated Goal: to get stronger PT Goal Formulation: With patient Time For Goal Achievement: 07/09/21 Potential to Achieve Goals: Good    Frequency Min 3X/week   Barriers to discharge        Co-evaluation               AM-PAC PT "6 Clicks" Mobility  Outcome Measure Help needed turning from your back to your side while in a flat bed without using bedrails?: A Little Help needed moving from lying on your back to sitting on the side of a flat bed without using bedrails?: A Little Help needed moving to and from a bed to a chair (including a  wheelchair)?: A Little Help needed standing up from a chair using your arms (e.g., wheelchair or bedside chair)?: A Little Help needed to walk in hospital room?: A Little Help needed climbing 3-5 steps with a railing? : A Little 6 Click Score: 18    End of Session Equipment Utilized During Treatment: Gait belt Activity Tolerance: Patient tolerated treatment well Patient left: with call bell/phone within reach;in chair (no alarm on arrival) Nurse Communication: Mobility status PT Visit Diagnosis: Other abnormalities of gait and mobility (R26.89)    Time: 1340-1400 PT Time Calculation (min) (ACUTE ONLY): 20 min   Charges:   PT Evaluation $PT Eval Low Complexity: [place], PT  Acute Rehab Dept ([place]) [phone] Pager [phone]  06/24/2021   [facility] 06/24/2021, 2:42 PM

## 2021-06-24 NOTE — Progress Notes (Signed)
Initial Nutrition Assessment  DOCUMENTATION CODES:   Non-severe (moderate) malnutrition in context of chronic illness  INTERVENTION:   -Magic cup TID with meals, each supplement provides 290 kcal and 9 grams of protein  -Multivitamin with minerals daily  -Beneprotein TID with meals, each provides 25 kcals and 6g protein  NUTRITION DIAGNOSIS:   Moderate Malnutrition related to chronic illness as evidenced by percent weight loss, moderate fat depletion, moderate muscle depletion.  GOAL:   Patient will meet greater than or equal to 90% of their needs  MONITOR:   PO intake, Supplement acceptance, Labs, Weight trends, I & O's  REASON FOR ASSESSMENT:   Malnutrition Screening Tool    ASSESSMENT:   70 y.o. male with medical history significant for history cva, hypretension, who developed painless jaundice about a month ago. Saw GI recently, plan for mrcp/ercp in a few days. Over past 2 weeks has developed extreme fatigue and also loss of appetite.  Patient in room, awaiting his lunch tray. His diet was just advanced to heart healthy today. States he is looking forward to his mashed potatoes. Pt likes ice cream and plans to eat some with every meal. Will order Magic cups. Pt states he has not been eating well for 1-2 weeks PTA. Tried Premier Protein and another brand of protein shakes but didn't like them. He is willing to try Beneprotein mixed with chocolate pudding.  Pt reports he has been consistently losing weight since his stroke. Pt reports 28 lbs total of weight loss. UBW is 180 lbs. Per weight records, pt has lost 23 lbs since 2/22 (13% wt loss x 5 months, significant for time frame).   Medications: Senokot  Labs reviewed.   NUTRITION - FOCUSED PHYSICAL EXAM:  Flowsheet Row Most Recent Value  Orbital Region Mild depletion  Upper Arm Region Moderate depletion  Thoracic and Lumbar Region Unable to assess  Buccal Region Mild depletion  Temple Region Mild depletion   Clavicle Bone Region Moderate depletion  Clavicle and Acromion Bone Region Moderate depletion  Scapular Bone Region Moderate depletion  Dorsal Hand Mild depletion  Patellar Region Unable to assess  Anterior Thigh Region Unable to assess  Posterior Calf Region Unable to assess  Edema (RD Assessment) None  Hair Reviewed  Eyes Reviewed  Mouth Reviewed       Diet Order:   Diet Order             Diet Heart Room service appropriate? Yes; Fluid consistency: Thin  Diet effective now                   EDUCATION NEEDS:   Education needs have been addressed  Skin:  Skin Assessment: Reviewed RN Assessment  Last BM:  7/16  Height:   Ht Readings from Last 1 Encounters:  06/23/21 5\' 11"  (1.803 m)    Weight:   Wt Readings from Last 1 Encounters:  06/23/21 65.3 kg    BMI:  Body mass index is 20.08 kg/m.  Estimated Nutritional Needs:   Kcal:  1900-2100  Protein:  85-100g  Fluid:  2L/day   Clayton Bibles, MS, RD, LDN Inpatient Clinical Dietitian Contact information available via Amion

## 2021-06-24 NOTE — Anesthesia Postprocedure Evaluation (Signed)
Anesthesia Post Note  Patient: Ruben Chavez  Procedure(s) Performed: ENDOSCOPIC RETROGRADE CHOLANGIOPANCREATOGRAPHY (ERCP) SPHINCTEROTOMY BILIARY BRUSHING BILIARY DILATION REMOVAL OF STONES BIOPSY     Patient location during evaluation: PACU Anesthesia Type: General Level of consciousness: awake and alert Pain management: pain level controlled Vital Signs Assessment: post-procedure vital signs reviewed and stable Respiratory status: spontaneous breathing, nonlabored ventilation, respiratory function stable and patient connected to nasal cannula oxygen Cardiovascular status: blood pressure returned to baseline and stable Postop Assessment: no apparent nausea or vomiting Anesthetic complications: no   No notable events documented.  Last Vitals:  Vitals:   06/24/21 0153 06/24/21 0518  BP: 103/61 116/69  Pulse: 74 67  Resp: 18 18  Temp: 36.4 C 36.5 C  SpO2: 95% 98%    Last Pain:  Vitals:   06/24/21 0518  TempSrc: Oral  PainSc:                  Audry Pili

## 2021-06-24 NOTE — Progress Notes (Addendum)
[facility] Progress Note  CC:  Malignant biliary obstruction  Subjective:  Feeling a little better today.  No specific complaints.  Tolerating some full liquids so nurse has advanced to heart healthy for lunch today.  Objective:  Vital signs in last 24 hours: Temp:  [97.6 F (36.4 C)-98 F (36.7 C)] 97.7 F (36.5 C) (07/20 0518) Pulse Rate:  [47-74] 67 (07/20 0518) Resp:  [14-19] 18 (07/20 0518) BP: (103-159)/(61-81) 116/69 (07/20 0518) SpO2:  [93 %-100 %] 98 % (07/20 0518) Weight:  [65.3 kg] 65.3 kg (07/19 1355) Last BM Date: 06/20/21 General:  Alert, chronically ill-appearing, in NAD; jaundice noted Heart:  Regular rate and rhythm; no murmurs Pulm:  CTAB.  No W/R/R. Abdomen:  Soft, non-distended.  BS present.  Non-tender.   Extremities:  Without edema. Neurologic:  Alert and oriented x 4;  grossly normal neurologically. Psych:  Alert and cooperative. Normal mood and affect.  Intake/Output from previous day: 07/19 0701 - 07/20 0700 In: 2265.9 [I.V.:1865.9; IV Piggyback:400] Out: 170 [Urine:170] Intake/Output this shift: Total I/O In: 60 [P.O.:60] Out: -   Lab Results: Recent Labs    06/22/21 1312 06/23/21 0435  WBC 14.3* 13.0*  HGB 11.3* 10.1*  HCT 33.5* 29.7*  PLT 562* 439*   BMET Recent Labs    06/22/21 1312 06/23/21 0435 06/24/21 0433  NA 127* 129* 129*  K 3.1* 3.4* 4.2  CL 90* 95* 98  CO2 25 24 19*  GLUCOSE 85 67* 122*  BUN 27* 25* 28*  CREATININE 0.57* 0.44* 0.66  CALCIUM 9.5 9.2 9.1   LFT Recent Labs    06/24/21 0433  PROT 6.4*  ALBUMIN 2.0*  AST 236*  ALT 279*  ALKPHOS 966*  BILITOT 20.4*  BILIDIR 10.7*   PT/INR Recent Labs    06/22/21 1312  LABPROT 20.9*  INR 1.8*    MR 3D Recon At Scanner  Result Date: 06/23/2021 CLINICAL DATA:  Obstructive jaundice and leukocytosis. Intrahepatic biliary ductal dilatation with concern for centrally obstructing biliary mass on recent CT. Planned for ERCP this evening.  EXAM: MRI ABDOMEN WITHOUT AND WITH CONTRAST (INCLUDING MRCP) TECHNIQUE: Multiplanar multisequence MR imaging of the abdomen was performed both before and after the administration of intravenous contrast. Heavily T2-weighted images of the biliary and pancreatic ducts were obtained, and three-dimensional MRCP images were rendered by post processing. CONTRAST:  68mL GADAVIST GADOBUTROL 1 MMOL/ML IV SOLN COMPARISON:  06/11/2021 CT abdomen/pelvis. FINDINGS: Lower chest: No acute abnormality at the lung bases. Hepatobiliary: Normal liver size and configuration. No hepatic steatosis. There is a 1.0 cm peripheral right liver dome hemangioma (series 26/image 26) with progressive discontinuous peripheral nodular enhancement. A few small lobulated and otherwise simple liver cysts are scattered in anterior left liver, largest 1.3 cm in the segment 4 left liver (series 5/image 18). There is marked diffuse intrahepatic biliary ductal dilatation with abrupt caliber transition at the biliary hilum. Suggestion of a poorly marginated enhancing obstructing 2.1 x 1.4 cm soft tissue mass at the biliary hilum (series 5/image 16) with associated mildly restricted diffusion on series 3/image 52. Common bile duct diameter 3 mm. No choledocholithiasis. Gallbladder is contracted with no gallstones. No definite gallbladder wall thickening accounting for contracted state. Pancreas: No pancreatic mass or duct dilation.  No pancreas divisum. Spleen: Normal size. No mass. Adrenals/Urinary Tract: Normal adrenals. No hydronephrosis. Scattered simple bilateral renal cortical cysts, largest 4.6 cm in the posterior lower right kidney. No suspicious renal masses. Stomach/Bowel: Normal non-distended stomach. Visualized  small and large bowel is normal caliber, with no bowel wall thickening. Vascular/Lymphatic: Atherosclerotic nonaneurysmal abdominal aorta. Patent portal, splenic, hepatic and renal veins. Mildly enlarged 1.4 cm short axis diameter  additional porta hepatis node (series 5/image 19). No pathologically enlarged lymph nodes in the abdomen. Other: No abdominal ascites or focal fluid collection. Musculoskeletal: No aggressive appearing focal osseous lesions. IMPRESSION: 1. Marked diffuse intrahepatic biliary ductal dilatation with abrupt caliber transition at the biliary hilum. Suggestion of a poorly marginated enhancing 2.1 x 1.4 cm soft tissue mass at the biliary hilum with associated mildly restricted diffusion. 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 ERCP with brush biopsy correlation already planned by report. 2. Mild porta hepatis lymphadenopathy, indeterminate for metastatic disease. Electronically Signed   By: Ilona Sorrel M.D.   On: 06/23/2021 10:48   DG ERCP BILIARY & PANCREATIC DUCTS  Result Date: 06/24/2021 CLINICAL DATA:  Obstructive jaundice, leukocytosis and possible Klatskin tumor. EXAM: ERCP TECHNIQUE: Multiple spot images obtained with the fluoroscopic device and submitted for interpretation post-procedure. FLUOROSCOPY TIME:  Fluoroscopy Time:  11 minutes 3 seconds Number of Acquired Spot Images: 0 COMPARISON:  None. FINDINGS: A total of 27 spot images are submitted for review. The images demonstrate a flexible duodenal scope in the descending duodenum with wire cannulation of both the pancreatic and common bile duct. Cholangiogram is performed. There is no significant dilation of the common bile duct. The cystic duct is patent with contrast material entering the gallbladder lumen. Diffuse dilation of the intrahepatic ducts present. On the final images, both pancreatic and biliary duct plastic stents have been placed. IMPRESSION: 1. Intrahepatic biliary ductal dilatation without evidence of common bile duct dilation suggesting obstruction at the level of the hepatic confluence. 2. Placement of plastic pancreatic and biliary stents. These images were submitted for radiologic  interpretation only. Please see the procedural report for the amount of contrast and the fluoroscopy time utilized. Electronically Signed   By: Jacqulynn Cadet M.D.   On: 06/24/2021 07:57   MR ABDOMEN MRCP W WO CONTAST  Result Date: 06/23/2021 CLINICAL DATA:  Obstructive jaundice and leukocytosis. Intrahepatic biliary ductal dilatation with concern for centrally obstructing biliary mass on recent CT. Planned for ERCP this evening. EXAM: MRI ABDOMEN WITHOUT AND WITH CONTRAST (INCLUDING MRCP) TECHNIQUE: Multiplanar multisequence MR imaging of the abdomen was performed both before and after the administration of intravenous contrast. Heavily T2-weighted images of the biliary and pancreatic ducts were obtained, and three-dimensional MRCP images were rendered by post processing. CONTRAST:  93mL GADAVIST GADOBUTROL 1 MMOL/ML IV SOLN COMPARISON:  06/11/2021 CT abdomen/pelvis. FINDINGS: Lower chest: No acute abnormality at the lung bases. Hepatobiliary: Normal liver size and configuration. No hepatic steatosis. There is a 1.0 cm peripheral right liver dome hemangioma (series 26/image 26) with progressive discontinuous peripheral nodular enhancement. A few small lobulated and otherwise simple liver cysts are scattered in anterior left liver, largest 1.3 cm in the segment 4 left liver (series 5/image 18). There is marked diffuse intrahepatic biliary ductal dilatation with abrupt caliber transition at the biliary hilum. Suggestion of a poorly marginated enhancing obstructing 2.1 x 1.4 cm soft tissue mass at the biliary hilum (series 5/image 16) with associated mildly restricted diffusion on series 3/image 52. Common bile duct diameter 3 mm. No choledocholithiasis. Gallbladder is contracted with no gallstones. No definite gallbladder wall thickening accounting for contracted state. Pancreas: No pancreatic mass or duct dilation.  No pancreas divisum. Spleen: Normal size. No mass. Adrenals/Urinary Tract: Normal adrenals.  No  hydronephrosis. Scattered simple bilateral renal cortical cysts, largest 4.6 cm in the posterior lower right kidney. No suspicious renal masses. Stomach/Bowel: Normal non-distended stomach. Visualized small and large bowel is normal caliber, with no bowel wall thickening. Vascular/Lymphatic: Atherosclerotic nonaneurysmal abdominal aorta. Patent portal, splenic, hepatic and renal veins. Mildly enlarged 1.4 cm short axis diameter additional porta hepatis node (series 5/image 19). No pathologically enlarged lymph nodes in the abdomen. Other: No abdominal ascites or focal fluid collection. Musculoskeletal: No aggressive appearing focal osseous lesions. IMPRESSION: 1. Marked diffuse intrahepatic biliary ductal dilatation with abrupt caliber transition at the biliary hilum. Suggestion of a poorly marginated enhancing 2.1 x 1.4 cm soft tissue mass at the biliary hilum with associated mildly restricted diffusion. 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 ERCP with brush biopsy correlation already planned by report. 2. Mild porta hepatis lymphadenopathy, indeterminate for metastatic disease. Electronically Signed   By: Ilona Sorrel M.D.   On: 06/23/2021 10:48    Assessment / Plan: 1.  Elevated LFTs: Imaging question cholangiocarcinoma, leukocytosis suggesting ascending cholangitis.  ERCP with biliary cannulation and wire placement into the left system.  Biliary stent placed.  Prophylactic pancreas stent placed.  Gastric biopsies obtained due to severe gastritis.  Tumor markers pending.  Cytology and pathology pending.  LFTs trending down nicely so far.  Holding off any any PBD with IR for now.  Continue to trend.  Is on abx and should complete at least 5 days worth.  2.  Painless jaundice 3.  Leukocytosis:  Stable for now.  Will monitor.  Is afebrile. 4.  Hypokalemia: Improving 5.  Hyponatremia:  Na+ still 129 this AM.   LOS: 2 days   Laban Emperor. Zehr  06/24/2021,  9:15 AM      Attending 97 Attestation   I have taken an interval history, reviewed the chart and examined the patient.   Slight improvement in LFTs.  Pathology not back yet.  Alkaline phosphatase has dramatically decreased which is very hopeful and suggestive of improvement in biliary obstruction.  Will take up to 2 to 3 weeks to see a complete normalization or what this particular stent is able to do.  Hopefully he will be doing well into tomorrow and can be discharged home soon.  I agree with the Advanced Practitioner's note, impression, and recommendations with updates and my documentation above.   Justice Britain, MD [facility] Advanced Endoscopy Office # [phone]

## 2021-06-25 ENCOUNTER — Encounter (HOSPITAL_COMMUNITY): Admission: RE | Payer: Self-pay | Source: Home / Self Care

## 2021-06-25 ENCOUNTER — Encounter: Payer: Self-pay | Admitting: Gastroenterology

## 2021-06-25 ENCOUNTER — Ambulatory Visit (HOSPITAL_COMMUNITY): Admission: RE | Admit: 2021-06-25 | Payer: PPO | Source: Home / Self Care | Admitting: Gastroenterology

## 2021-06-25 ENCOUNTER — Inpatient Hospital Stay (HOSPITAL_COMMUNITY): Payer: PPO

## 2021-06-25 DIAGNOSIS — E785 Hyperlipidemia, unspecified: Secondary | ICD-10-CM | POA: Diagnosis not present

## 2021-06-25 DIAGNOSIS — K831 Obstruction of bile duct: Secondary | ICD-10-CM | POA: Diagnosis not present

## 2021-06-25 DIAGNOSIS — E440 Moderate protein-calorie malnutrition: Secondary | ICD-10-CM

## 2021-06-25 DIAGNOSIS — I10 Essential (primary) hypertension: Secondary | ICD-10-CM | POA: Diagnosis not present

## 2021-06-25 DIAGNOSIS — C221 Intrahepatic bile duct carcinoma: Secondary | ICD-10-CM

## 2021-06-25 DIAGNOSIS — C801 Malignant (primary) neoplasm, unspecified: Secondary | ICD-10-CM | POA: Diagnosis not present

## 2021-06-25 DIAGNOSIS — C240 Malignant neoplasm of extrahepatic bile duct: Secondary | ICD-10-CM | POA: Diagnosis not present

## 2021-06-25 LAB — CBC WITH DIFFERENTIAL/PLATELET
Abs Immature Granulocytes: 0.08 10*3/uL — ABNORMAL HIGH (ref 0.00–0.07)
Basophils Absolute: 0.1 10*3/uL (ref 0.0–0.1)
Basophils Relative: 0 %
Eosinophils Absolute: 0.1 10*3/uL (ref 0.0–0.5)
Eosinophils Relative: 0 %
HCT: 31.2 % — ABNORMAL LOW (ref 39.0–52.0)
Hemoglobin: 10.2 g/dL — ABNORMAL LOW (ref 13.0–17.0)
Immature Granulocytes: 1 %
Lymphocytes Relative: 5 %
Lymphs Abs: 0.8 10*3/uL (ref 0.7–4.0)
MCH: 30.2 pg (ref 26.0–34.0)
MCHC: 32.7 g/dL (ref 30.0–36.0)
MCV: 92.3 fL (ref 80.0–100.0)
Monocytes Absolute: 1.3 10*3/uL — ABNORMAL HIGH (ref 0.1–1.0)
Monocytes Relative: 8 %
Neutro Abs: 15 10*3/uL — ABNORMAL HIGH (ref 1.7–7.7)
Neutrophils Relative %: 86 %
Platelets: 447 10*3/uL — ABNORMAL HIGH (ref 150–400)
RBC: 3.38 MIL/uL — ABNORMAL LOW (ref 4.22–5.81)
RDW: 16.9 % — ABNORMAL HIGH (ref 11.5–15.5)
WBC: 17.4 10*3/uL — ABNORMAL HIGH (ref 4.0–10.5)
nRBC: 0 % (ref 0.0–0.2)

## 2021-06-25 LAB — HEPATIC FUNCTION PANEL
ALT: 231 U/L — ABNORMAL HIGH (ref 0–44)
AST: 181 U/L — ABNORMAL HIGH (ref 15–41)
Albumin: 2.1 g/dL — ABNORMAL LOW (ref 3.5–5.0)
Alkaline Phosphatase: 1435 U/L — ABNORMAL HIGH (ref 38–126)
Bilirubin, Direct: 8 mg/dL — ABNORMAL HIGH (ref 0.0–0.2)
Indirect Bilirubin: 5.9 mg/dL — ABNORMAL HIGH (ref 0.3–0.9)
Total Bilirubin: 13.9 mg/dL — ABNORMAL HIGH (ref 0.3–1.2)
Total Protein: 6.4 g/dL — ABNORMAL LOW (ref 6.5–8.1)

## 2021-06-25 LAB — BASIC METABOLIC PANEL
Anion gap: 11 (ref 5–15)
BUN: 24 mg/dL — ABNORMAL HIGH (ref 8–23)
CO2: 20 mmol/L — ABNORMAL LOW (ref 22–32)
Calcium: 9 mg/dL (ref 8.9–10.3)
Chloride: 100 mmol/L (ref 98–111)
Creatinine, Ser: 0.6 mg/dL — ABNORMAL LOW (ref 0.61–1.24)
GFR, Estimated: >60 mL/min (ref >60)
Glucose, Bld: 101 mg/dL — ABNORMAL HIGH (ref 70–99)
Potassium: 3.8 mmol/L (ref 3.5–5.1)
Sodium: 131 mmol/L — ABNORMAL LOW (ref 135–145)

## 2021-06-25 LAB — CANCER ANTIGEN 19-9: CA 19-9: 351 U/mL — ABNORMAL HIGH (ref 0–35)

## 2021-06-25 LAB — CEA: CEA: 4.9 ng/mL — ABNORMAL HIGH (ref 0.0–4.7)

## 2021-06-25 LAB — PROTIME-INR
INR: 1.3 — ABNORMAL HIGH (ref 0.8–1.2)
Prothrombin Time: 16.2 seconds — ABNORMAL HIGH (ref 11.4–15.2)

## 2021-06-25 LAB — AFP TUMOR MARKER: AFP, Serum, Tumor Marker: 2.8 ng/mL (ref 0.0–8.4)

## 2021-06-25 LAB — CYTOLOGY - NON PAP

## 2021-06-25 SURGERY — ENDOSCOPIC RETROGRADE CHOLANGIOPANCREATOGRAPHY (ERCP) WITH PROPOFOL
Anesthesia: General

## 2021-06-25 MED ORDER — CIPROFLOXACIN HCL 500 MG PO TABS
500.0000 mg | ORAL_TABLET | Freq: Two times a day (BID) | ORAL | Status: DC
  Administered 2021-06-26: 500 mg via ORAL
  Filled 2021-06-25: qty 1

## 2021-06-25 MED ORDER — IOHEXOL 350 MG/ML SOLN
100.0000 mL | Freq: Once | INTRAVENOUS | Status: AC | PRN
  Administered 2021-06-25: 60 mL via INTRAVENOUS

## 2021-06-25 MED ORDER — CIPROFLOXACIN HCL 500 MG PO TABS: 500.0000 mg | ORAL_TABLET | Freq: Two times a day (BID) | ORAL | Status: DC

## 2021-06-25 NOTE — Progress Notes (Signed)
PROGRESS NOTE    HOLTEN SPANO  SWF:[id number] DOB: 1951/10/22 DOA: 06/22/2021 PCP: Isaac Bliss, Rayford Halsted, MD    Brief Narrative:  Mr. Garrelts was admitted to the hospital with the working diagnosis of obstructive jaundice, due to Klatskin tumor.    70 year old male past medical history for hypertension and remote CVA who presented with jaundice.  Reported 1 month of worsening painless jaundice.  Over the last 2 weeks prior to hospitalization he developed extreme generalized weakness and poor appetite that prompted him to come to the hospital. On his initial physical examination blood pressure 112/52, heart rate 71, respiratory rate 20, oxygen saturation 95%, positive icterus, lungs clear to auscultation, heart S1-S2, present, rhythmic, abdomen soft, no organomegaly, nontender, no lower extremity edema.   Sodium 127, potassium 3.1, chloride 90, bicarb 25, glucose 85, BUN 27, creatinine 0.57, magnesium 2.8, alkaline phosphatase 2274, lipase 40, AST 420, ALT 436, total bilirubin is 28.1, ammonia 65.  White count 14.3, hemoglobin 11.3, hematocrit 33.5, platelets 562. SARS COVID-19 negative.   Urinalysis Pacific gravity 1.023, 30 protein, 0-5 white cells. Serum osmolality 620, urine sodium 23.   MRCP with diffuse intrahepatic biliary duct limitation with abrupt caliber transition at the biliary hilum.  Suggesting 2.1 x 1.4 cm soft tissue mass at the biliary hilum.  Suspicious for hilar cholangiocarcinoma. (Klatskin tumor).   07/19 ERCP with biliary tract obstruction secondary to mass in the common hepatic duct-bifurcation of the right and left hepatic ducts. Performed biliary sphincterectomy, stricture dilatation, and plastic biliary stent was placed (traversed into the left hepatic system).   One temporary plastic pancreatic stent was placed into the ventral pancreatic duct. Brushings were obtained for cytology.  Pending cytology report, plan for possible dc home 07/22 if improvement  in wbc    Assessment & Plan:   Principal Problem:   Obstructive jaundice due to malignant neoplasm [facility]) Active Problems:   Essential hypertension   Hyperlipidemia   Stroke (HCC)   Bile duct obstruction   Klatskin's tumor (HCC)   Malnutrition of moderate degree   Obstructive jaundice, common hepatic duct obstruction, Klatskin tumor. Biliary adenocarcinoma.  Patient sp ERCP and stent placement biliary duct and pancreatic duct.  Pathology positive for malignant cells consistent with adenocarcinoma. Gastric biopsy negative for H Pylori. Positive for gastritis.   No nausea or vomiting, tolerating po well.  Today with worsening wbc is 17,4 . ALK  up to 1,425 from 966, AST 181 and ALT 231. T Bil down to 13,9 from 20.4 (indirect is 5.9 and direct 8,0)    Plan to continue antibiotic therapy with cefepime and metronidazole for now. Follow up cell count in am, if improvement in leukocytosis, possible dc home with oral antibiotic therapy.  Consult oncology as inpatient.    2. HTN. Patient at home on lisinopril and hctz. His blood pressure today 127 to 220 mmHg systolic, will continue to hold on antihypertensive medications.    3. CVA hx. On asa, resume statin when LFT' back to baseline. Chronic right upper extremity weakness.    4. Depression/ insomnia. Continue with trazodone and wellbutrin.   5. Hyponatremia and hypokalemia/ non anion gap metabolic acidosis. SIADH. Patient is tolerating po well, renal function with serum cr at 0.60 with K at 3,8 and serum bicarbonate at 20. Will continue with oral sodium bicarbonate for one more day and follow up renal function in am. Continue to hold on IV fluids for now.    6. Moderate calorie protein malnutrition. Continue with nutritional  supplements.  Follow with PT recommendations.   Patient continue to be at high risk for worsening leukocytosis   Status is: Inpatient  Remains inpatient appropriate because:IV treatments appropriate due  to intensity of illness or inability to take PO  Dispo: The patient is from: Home              Anticipated d/c is to: Home              Patient currently is not medically stable to d/c.   Difficult to place patient No   DVT prophylaxis: Enoxaparin   Code Status:   full  Family Communication:  I spoke with patient's wife at the bedside, we talked in detail about patient's condition, plan of care and prognosis and all questions were addressed.      Nutrition Status: Nutrition Problem: Moderate Malnutrition Etiology: chronic illness Signs/Symptoms: percent weight loss, moderate fat depletion, moderate muscle depletion Interventions: Refer to RD note for recommendations, MVI, Magic cup     Consultants:  GI   Procedures:  ERCP (pancreatic and biliary stents)   Antimicrobials:  Cefepime and metronidazole     Subjective: Patient with no abdominal pain, no nausea or vomiting, no chest pain or dyspnea,   Objective: Vitals:   06/24/21 1318 06/24/21 2005 06/25/21 0505 06/25/21 1408  BP: 127/80 128/73 134/74 127/72  Pulse: 66 72 68 73  Resp: _0 Temp:  97.9 F (36.6 C) 98.2 F (36.8 C) 98.3 F (36.8 C)  TempSrc:  Oral Oral Oral  SpO2: 100% 100% 100% 100%  Weight:      Height:        Intake/Output Summary (Last 24 hours) at 06/25/2021 1424 Last data filed at 06/25/2021 0600 Gross per 24 hour  Intake 1054.14 ml  Output 550 ml  Net 504.14 ml   Filed Weights   06/22/21 1204 06/23/21 1355  Weight: 65.3 kg 65.3 kg    Examination:   General: Not in pain or dyspnea  Neurology: Awake and alert, non focal  E ENT: mild pallor, positive icterus, oral mucosa moist Cardiovascular: No JVD. S1-S2 present, rhythmic, no gallops, rubs, or murmurs. No lower extremity edema. Pulmonary: positive breath sounds bilaterally, adequate air movement, no wheezing, rhonchi or rales. Gastrointestinal. Abdomen. Mild distention but soft and non tender Skin. No  rashes Musculoskeletal: no joint deformities     Data Reviewed: I have personally reviewed following labs and imaging studies  CBC: Recent Labs  Lab 06/19/21 1425 06/22/21 1312 06/23/21 0435 06/24/21 0937 06/25/21 0541  WBC 15.7* 14.3* 13.0* 15.7* 17.4*  NEUTROABS  --  12.6*  --   --  15.0*  HGB 10.6* 11.3* 10.1* 9.9* 10.2*  HCT 31.0* 33.5* 29.7* 29.4* 31.2*  MCV 88.7 87.9 88.4 91.0 92.3  PLT 488.0* 562* 439* 457* 174*   Basic Metabolic Panel: Recent Labs  Lab 06/19/21 1425 06/22/21 1312 06/23/21 0435 06/24/21 0433 06/25/21 0541  NA 124* 127* 129* 129* 131*  K 3.0* 3.1* 3.4* 4.2 3.8  CL 90* 90* 95* 98 100  CO2 _1 19* 20*  GLUCOSE 110* 85 67* 122* 101*  BUN 24* 27* 25* 28* 24*  CREATININE 1.18 0.57* 0.44* 0.66 0.60*  CALCIUM 9.0 9.5 9.2 9.1 9.0  MG  --  2.8*  --   --   --    GFR: Estimated Creatinine Clearance: 78.2 mL/min (A) (by C-G formula based on SCr of 0.6 mg/dL (L)). Liver Function Tests: Recent Labs  Lab  06/19/21 1425 06/22/21 1312 06/23/21 0435 06/24/21 0433 06/25/21 0541  AST 417* 420* 325* 236* 181*  ALT 476* 436* 340* 279* 231*  ALKPHOS 2,495* 2,274* 1,911* 966* 1,435*  BILITOT 28.8* 28.1* 23.5* 20.4* 13.9*  PROT 6.5 7.8 6.7 6.4* 6.4*  ALBUMIN 3.1* 2.6* 2.2* 2.0* 2.1*   Recent Labs  Lab 06/22/21 1312  LIPASE 40   Recent Labs  Lab 06/22/21 1312  AMMONIA 65*   Coagulation Profile: Recent Labs  Lab 06/19/21 1425 06/22/21 1312 06/25/21 1214  INR 1.7* 1.8* 1.3*   Cardiac Enzymes: No results for input(s): CKTOTAL, CKMB, CKMBINDEX, TROPONINI in the last 168 hours. BNP (last 3 results) No results for input(s): PROBNP in the last 8760 hours. HbA1C: No results for input(s): HGBA1C in the last 72 hours. CBG: No results for input(s): GLUCAP in the last 168 hours. Lipid Profile: No results for input(s): CHOL, HDL, LDLCALC, TRIG, CHOLHDL, LDLDIRECT in the last 72 hours. Thyroid Function Tests: No results for input(s): TSH,  T4TOTAL, FREET4, T3FREE, THYROIDAB in the last 72 hours. Anemia Panel: No results for input(s): VITAMINB12, FOLATE, FERRITIN, TIBC, IRON, RETICCTPCT in the last 72 hours.    Radiology Studies: I have reviewed all of the imaging during this hospital visit personally     Scheduled Meds:  aspirin  81 mg Oral Daily   buPROPion  150 mg Oral Daily   enoxaparin (LOVENOX) injection  40 mg Subcutaneous Q24H   indomethacin  100 mg Rectal Once   multivitamin with minerals  1 tablet Oral Daily   pantoprazole  40 mg Oral Daily   senna-docusate  2 tablet Oral Daily   sodium bicarbonate  1,300 mg Oral TID   traZODone  100 mg Oral QHS   Continuous Infusions:  ceFEPime (MAXIPIME) IV 2 g (06/25/21 0512)   metronidazole 500 mg (06/25/21 0759)     LOS: 3 days        Jarrod Bodkins Gerome Apley, MD

## 2021-06-25 NOTE — Consult Note (Addendum)
[place] [phone]   Fax:([phone] Media NOTE  Patient Care Team: Isaac Bliss, Rayford Halsted, MD as PCP - General (Internal Medicine)  Hematological/Oncological History # Obstructive Biliary Mass, Concerning for Cholangiocarcinoma 06/11/2021: CT A/P showed a marked intrahepatic bile duct dilatation with transition to normal caliber common bile duct. There is a ill-defined, infiltrative soft tissue within the convergence of the dilated intrahepatic bile ducts. Planned for outpatient ERCP 06/22/2021: presented to the ED with elevated LFTs and jaundice. ALK phos 2495, AST 417, ALT 436, Bilirubin 28.8.  06/23/2021: MRI abdomen showed diffuse intrahepatic biliary ductal dilatation with abrupt caliber transition at the biliary hilum. Suggestion of a poorly marginated enhancing 2.1 x 1.4 cm soft tissue mass at the biliary hilum with associated mildly restricted diffusion. ERCP with brushings performed. Pathology results consistent with adenocarcinoma 06/25/2021: establish care with Dr. Lorenso Courier   CHIEF COMPLAINTS/PURPOSE OF CONSULTATION:  "Obstructing biliary mass "  HISTORY OF PRESENTING ILLNESS:  Ruben Chavez 70 y.o. male with medical history significant for HLD, HTN, and prostate cancer who presents for evaluation of an obstructing biliary mass concerning for cholangiocarcinoma.   On review of the previous records Ruben Chavez underwent CT scan of the abdomen due to weight loss, anorexia, and jaundice.  CT scan showed marked intrahepatic bile duct dilatation with transition to normal caliber common bile duct.  There was an ill-defined soft tissue mass concerning for malignancy.  The patient was subsequently planned for an outpatient ERCP but due to rapidly worsening LFTs was admitted through the emergency department on 06/22/2021.  At that time he was noted to have an alk phos of 2495, AST of 417, ALT of 436, and a bilirubin of 28.8.  MRI abdomen on  06/23/2021 confirmed intrahepatic ductal dilatation with a poorly enhancing mass 2.1 x 1.4 cm at the biliary hilum.  ERCP was performed to alleviate the obstruction and brushings were taken.  These brushings are consistent with adenocarcinoma.  Due to the concern for the presence of a cholangiocarcinoma the patient was referred to oncology for further evaluation management.  On exam today Ruben Chavez had unfortunately just thrown up.  He drank a lot of milk and he thinks he may have drank too much and it caused him to have nausea and he vomited.  He reports that otherwise he feels better after the placement of the stent.  He notes he is not having any fevers, chills, sweats.  He is having issues with his bowels moving.  He also just completed his CT scan of the chest with a read currently pending.  A full 10 point ROS is listed below.  The bulk of our discussion focused on the diagnosis of a cholangiocarcinoma and the steps moving forward.  MEDICAL HISTORY:  Past Medical History:  Diagnosis Date   Elevated PSA    Hyperlipidemia    pt says not anymore   Hypertension    Prostate cancer ([place]) 2013    SURGICAL HISTORY: Past Surgical History:  Procedure Laterality Date   bilateral inguinal hernia  2008   BILIARY BRUSHING  06/23/2021   Procedure: BILIARY BRUSHING;  Surgeon: Irving Copas., MD;  Location: [facility];  Service: Gastroenterology;;   BILIARY DILATION  06/23/2021   Procedure: BILIARY DILATION;  Surgeon: Irving Copas., MD;  Location: [facility];  Service: Gastroenterology;;   BILIARY STENT PLACEMENT N/A 06/23/2021   Procedure: BILIARY STENT PLACEMENT;  Surgeon: Irving Copas., MD;  Location: [facility];  Service:  Gastroenterology;  Laterality: N/A;   BIOPSY  06/23/2021   Procedure: BIOPSY;  Surgeon: Rush Landmark Telford Nab., MD;  Location: [facility];  Service: Gastroenterology;;   Kathleen Argue STUDY  01/28/2020   Procedure: BUBBLE STUDY;  Surgeon: [name], MD;  Location: [place];  Service: Cardiovascular;;   CYST REMOVAL TRUNK  2016   sebaceous cyst   CYSTOSCOPY  04/22/2016   Procedure: CYSTOSCOPY;  Surgeon: [name], MD;  Location: [facility];  Service: Urology;;   ERCP N/A 06/23/2021   Procedure: ENDOSCOPIC RETROGRADE CHOLANGIOPANCREATOGRAPHY (ERCP);  Surgeon: Irving Copas., MD;  Location: [facility];  Service: Gastroenterology;  Laterality: N/A;   IR ANGIO INTRA EXTRACRAN SEL COM CAROTID INNOMINATE BILAT MOD SED  05/30/2020   IR ANGIO VERTEBRAL SEL SUBCLAVIAN INNOMINATE UNI R MOD SED  01/25/2020   IR ANGIO VERTEBRAL SEL VERTEBRAL BILAT MOD SED  05/30/2020   IR CT HEAD LTD  01/25/2020   IR PERCUTANEOUS ART THROMBECTOMY/INFUSION INTRACRANIAL INC DIAG ANGIO  01/25/2020   IR US GUIDE VASC ACCESS RIGHT  05/30/2020   KNEE ARTHROSCOPY  2007   right   LOOP RECORDER INSERTION N/A 01/28/2020   Procedure: LOOP RECORDER INSERTION;  Surgeon: [name], MD;  Location: [place] [facility];  Service: Cardiovascular;  Laterality: N/A;   PANCREATIC STENT PLACEMENT  06/23/2021   Procedure: PANCREATIC STENT PLACEMENT;  Surgeon: Irving Copas., MD;  Location: [facility];  Service: Gastroenterology;;   PROSTATE BIOPSY  2008   PROSTATE BIOPSY  2013   PROSTATE BIOPSY  01/28/2016   RADIOACTIVE SEED IMPLANT N/A 04/22/2016   Procedure: RADIOACTIVE SEED IMPLANT/BRACHYTHERAPY IMPLANT;  Surgeon: [name], MD;  Location: [facility];  Service: Urology;  Laterality: N/A;   RADIOLOGY WITH ANESTHESIA N/A 01/24/2020   Procedure: IR WITH ANESTHESIA;  Surgeon: [name], MD;  Location: [place];  Service: Radiology;  Laterality: N/A;   REMOVAL OF STONES  06/23/2021   Procedure: REMOVAL OF STONES;  Surgeon: Rush Landmark Telford Nab., MD;  Location: [facility];  Service: Gastroenterology;;   Joan Mayans  06/23/2021   Procedure: Joan Mayans;  Surgeon: Rush Landmark Telford Nab., MD;   Location: [facility];  Service: Gastroenterology;;   TEE WITHOUT CARDIOVERSION N/A 01/28/2020   Procedure: TRANSESOPHAGEAL ECHOCARDIOGRAM (TEE);  Surgeon: [name], MD;  Location: [facility];  Service: Cardiovascular;  Laterality: N/A;   TONSILLECTOMY     TOTAL HIP ARTHROPLASTY  2001   left   TOTAL HIP ARTHROPLASTY Right 01/18/2018   Procedure: RIGHT TOTAL HIP ARTHROPLASTY ANTERIOR APPROACH;  Surgeon: [name], MD;  Location: [facility];  Service: Orthopedics;  Laterality: Right;    SOCIAL HISTORY: Social History   Socioeconomic History   Marital status: Married    Spouse name: [name]   Number of children: Not on file   Years of education: Not on file   Highest education level: Not on file  Occupational History   Not on file  Tobacco Use   Smoking status: Former    Packs/day: 1.00    Years: 32.00    Pack years: 32.00    Types: Cigarettes    Quit date: 06/06/1999    Years since quitting: 22.0   Smokeless tobacco: Never  Vaping Use   Vaping Use: Never used  Substance and Sexual Activity   Alcohol use: Not Currently    Alcohol/week: 14.0 standard drinks    Types: 14 Shots of liquor per week   Drug use: No   Sexual activity: Yes  Other Topics Concern   Not on file  Social History Narrative   Not on file   Social Determinants of Health   Financial Resource Strain: Not on file  Food Insecurity: Not on file  Transportation Needs: Not on file  Physical Activity: Not on file  Stress: Not on file  Social Connections: Not on file  Intimate Partner Violence: Not on file    FAMILY HISTORY: Family History  Problem Relation Age of Onset   Pancreatic cancer Mother        small bowel cancer   Heart disease Father    Colon cancer Neg Hx    Stomach cancer Neg Hx     ALLERGIES:  is allergic to sulfa antibiotics, advil [ibuprofen], aleve [naproxen], betadine [povidone iodine], chloroxylenol (antiseptic), poison ivy extract, and povidone-iodine.  MEDICATIONS:   Current Facility-Administered Medications  Medication Dose Route Frequency Provider Last Rate Last Admin   aspirin EC tablet 81 mg  81 mg Oral Daily Arrien, Jimmy Picket, MD   81 mg at 06/25/21 1107   buPROPion (WELLBUTRIN XL) 24 hr tablet 150 mg  150 mg Oral Daily Gwynne Edinger, MD   150 mg at 06/25/21 1107   ceFEPIme (MAXIPIME) 2 g in sodium chloride 0.9 % 100 mL IVPB  2 g Intravenous Q8H Polly Cobia, RPH   Stopped at 06/25/21 1517   [START ON 06/26/2021] ciprofloxacin (CIPRO) tablet 500 mg  500 mg Oral BID Mansouraty, Telford Nab., MD       enoxaparin (LOVENOX) injection 40 mg  40 mg Subcutaneous Q24H Vann, Jessica U, DO   40 mg at 06/24/21 2109   indomethacin (INDOCIN) 50 MG suppository 100 mg  100 mg Rectal Once Levin Erp, PA       metroNIDAZOLE (FLAGYL) IVPB 500 mg  500 mg Intravenous Q8H Wouk, Ailene Rud, MD 100 mL/hr at 06/25/21 1647 500 mg at 06/25/21 1647   multivitamin with minerals tablet 1 tablet  1 tablet Oral Daily Arrien, Jimmy Picket, MD   1 tablet at 06/25/21 1107   ondansetron (ZOFRAN) injection 4 mg  4 mg Intravenous Once PRN Audry Pili, MD       pantoprazole (PROTONIX) EC tablet 40 mg  40 mg Oral Daily Gwynne Edinger, MD   40 mg at 06/25/21 1107   senna-docusate (Senokot-S) tablet 2 tablet  2 tablet Oral Daily Gwynne Edinger, MD   2 tablet at 06/25/21 1107   sodium bicarbonate tablet 1,300 mg  1,300 mg Oral TID Tawni Millers, MD   1,300 mg at 06/25/21 1647   traZODone (DESYREL) tablet 100 mg  100 mg Oral QHS Gwynne Edinger, MD   100 mg at 06/24/21 2108    REVIEW OF SYSTEMS:   Constitutional: ( - ) fevers, ( - )  chills , ( - ) night sweats Eyes: ( - ) blurriness of vision, ( - ) double vision, ( - ) watery eyes Ears, nose, mouth, throat, and face: ( - ) mucositis, ( - ) sore throat Respiratory: ( - ) cough, ( - ) dyspnea, ( - ) wheezes Cardiovascular: ( - ) palpitation, ( - ) chest discomfort, ( - ) lower extremity  swelling Gastrointestinal:  ( - ) nausea, ( - ) heartburn, ( - ) change in bowel habits Skin: ( - ) abnormal skin rashes Lymphatics: ( - ) new lymphadenopathy, ( - ) easy bruising Neurological: ( - ) numbness, ( - ) tingling, ( - ) new  weaknesses Behavioral/Psych: ( - ) mood change, ( - ) new changes  All other systems were reviewed with the patient and are negative.  PHYSICAL EXAMINATION: ECOG PERFORMANCE STATUS: 2 - Symptomatic, <50% confined to bed  Vitals:   06/25/21 0505 06/25/21 1408  BP: 134/74 127/72  Pulse: 68 73  Resp: 18 16  Temp: 98.2 F (36.8 C) 98.3 F (36.8 C)  SpO2: 100% 100%   Filed Weights   06/22/21 1204 06/23/21 1355  Weight: 144 lb (65.3 kg) 144 lb (65.3 kg)    GENERAL: jaundiced elderly Caucasian male in NAD  SKIN: jaundiced, color, texture, turgor are normal, no rashes or significant lesions EYES: conjunctiva are pink and non-injected, sclera jaundiced. LUNGS: clear to auscultation and percussion with normal breathing effort HEART: regular rate & rhythm and no murmurs and no lower extremity edema ABDOMEN: soft, non-tender, non-distended, normal bowel sounds Musculoskeletal: no cyanosis of digits and no clubbing  PSYCH: alert & oriented x 3, fluent speech NEURO: Paresis of the left side of the body, predominantly the left arm.  Patient is ambulatory.  LABORATORY DATA:  I have reviewed the data as listed CBC Latest Ref Rng & Units 06/25/2021 06/24/2021 06/23/2021  WBC 4.0 - 10.5 K/uL 17.4(H) 15.7(H) 13.0(H)  Hemoglobin 13.0 - 17.0 g/dL 10.2(L) 9.9(L) 10.1(L)  Hematocrit 39.0 - 52.0 % 31.2(L) 29.4(L) 29.7(L)  Platelets 150 - 400 K/uL 447(H) 457(H) 439(H)    CMP Latest Ref Rng & Units 06/25/2021 06/24/2021 06/23/2021  Glucose 70 - 99 mg/dL 101(H) 122(H) 67(L)  BUN 8 - 23 mg/dL 24(H) 28(H) 25(H)  Creatinine 0.61 - 1.24 mg/dL 0.60(L) 0.66 0.44(L)  Sodium 135 - 145 mmol/L 131(L) 129(L) 129(L)  Potassium 3.5 - 5.1 mmol/L 3.8 4.2 3.4(L)  Chloride 98 - 111  mmol/L 100 98 95(L)  CO2 22 - 32 mmol/L 20(L) 19(L) 24  Calcium 8.9 - 10.3 mg/dL 9.0 9.1 9.2  Total Protein 6.5 - 8.1 g/dL 6.4(L) 6.4(L) 6.7  Total Bilirubin 0.3 - 1.2 mg/dL 13.9(H) 20.4(HH) 23.5(HH)  Alkaline Phos 38 - 126 U/L 1,435(H) 966(H) 1,911(H)  AST 15 - 41 U/L 181(H) 236(H) 325(H)  ALT 0 - 44 U/L 231(H) 279(H) 340(H)     PATHOLOGY: NON GYN CYTOLOGY - NON PAP  CASE: [id number]  PATIENT: Ruben Chavez  Non-Gynecological Cytology Report      Clinical History: Abnormal LFTs and dilated bile duct; irregular Z-line  @ 43  Specimen Submitted:  A. COMMON BILE DUCT, STRICTURE, BRUSHING:    FINAL MICROSCOPIC DIAGNOSIS:  - Malignant cells consistent with adenocarcinoma   SPECIMEN ADEQUACY:  Satisfactory for evaluation   DIAGNOSTIC COMMENTS:  Dr. Tresa Moore agrees.   GROSS:  Received is/are 20 ccs of slightly cloudy fluid with brush. (CM:cm)  Prepared:  Smears:  0  Concentration Method (Thin Prep):  1  Cell Block:  1  Additional Studies:  n/a      Final Diagnosis performed by Claudette Laws, MD.   Electronically signed  06/25/2021       RADIOGRAPHIC STUDIES: I have personally reviewed the radiological images as listed and agreed with the findings in the report: Biliary obstruction with biliary mass, concern for cholangiocarcinoma. CT ABDOMEN PELVIS W WO CONTRAST  Result Date: 06/11/2021 CLINICAL DATA:  Weight loss, anorexia and jaundice. Evaluate for malignancy. EXAM: CT ABDOMEN AND PELVIS WITHOUT AND WITH CONTRAST TECHNIQUE: Multidetector CT imaging of the abdomen and pelvis was performed following the standard protocol before and following the bolus administration of intravenous contrast. CONTRAST:  152m  OMNIPAQUE IOHEXOL 300 MG/ML  SOLN COMPARISON:  Renal scratch set CT AP 11/14/2019. FINDINGS: Lower chest: No acute abnormality. Hepatobiliary: There is moderate to marked intrahepatic bile duct dilatation. Dilated intrahepatic bile ducts converge centrally. The  common bile duct is nondilated. At the convergence of the dilated bile ducts there is focal luminal narrowing of the portal vein, image 50/12. Poorly defined (and difficult to measure) infiltrative soft tissue within the convergence of the intrahepatic bile ducts measures approximately 2.2 x 1.6 cm, image 30/10 and image 47/12. The 2 fluid attenuating structures are noted within segment 4 and segment 2 which are favored to represent small cysts. The gallbladder appears collapsed. Pancreas: No signs of pancreatic mass, inflammation or main duct dilatation. Spleen: Normal in size without focal abnormality. Adrenals/Urinary Tract: Normal appearance of the adrenal glands. 4 mm nonobstructing stone identified within inferior pole of the right kidney. Simple appearing cyst arises off the inferior pole of the right kidney measuring 5.1 cm, image 49/7. No hydronephrosis identified bilaterally. Urinary bladder is largely obscured by beam hardening artifact from patient's arthroplasty devices. Stomach/Bowel: Stomach appears normal. The appendix is visualized and is within normal limits. No bowel wall thickening, inflammation, or distension. Vascular/Lymphatic: Aortic atherosclerosis. No aneurysm. No adenopathy identified within the abdomen or pelvis. Reproductive: Seed implants noted within the prostate gland. Other: No free fluid or fluid collections. Musculoskeletal: There is no acute or suspicious osseous findings. The multilevel degenerative disc disease identified within the lumbar spine. IMPRESSION: 1. There is moderate to marked intrahepatic bile duct dilatation with transition to normal caliber common bile duct. There is a ill-defined, infiltrative soft tissue within the convergence of the dilated intrahepatic bile ducts. The diagnosis of exclusion is cholangiocarcinoma. More definitive characterization with contrast enhanced MRI/MRCP is recommended. 2. No evidence for nodal metastasis or solid organ metastasis within  the abdomen or pelvis. 3. Nonobstructing right renal calculus. 4. Aortic atherosclerosis. Aortic Atherosclerosis (ICD10-I70.0). These results will be called to the ordering clinician or representative by the Radiologist Assistant, and communication documented in the PACS or [employer]. Electronically Signed   By: Kerby Moors M.D.   On: 06/11/2021 16:13   MR 3D Recon At Scanner  Result Date: 06/23/2021 CLINICAL DATA:  Obstructive jaundice and leukocytosis. Intrahepatic biliary ductal dilatation with concern for centrally obstructing biliary mass on recent CT. Planned for ERCP this evening. EXAM: MRI ABDOMEN WITHOUT AND WITH CONTRAST (INCLUDING MRCP) TECHNIQUE: Multiplanar multisequence MR imaging of the abdomen was performed both before and after the administration of intravenous contrast. Heavily T2-weighted images of the biliary and pancreatic ducts were obtained, and three-dimensional MRCP images were rendered by post processing. CONTRAST:  64m GADAVIST GADOBUTROL 1 MMOL/ML IV SOLN COMPARISON:  06/11/2021 CT abdomen/pelvis. FINDINGS: Lower chest: No acute abnormality at the lung bases. Hepatobiliary: Normal liver size and configuration. No hepatic steatosis. There is a 1.0 cm peripheral right liver dome hemangioma (series 26/image 26) with progressive discontinuous peripheral nodular enhancement. A few small lobulated and otherwise simple liver cysts are scattered in anterior left liver, largest 1.3 cm in the segment 4 left liver (series 5/image 18). There is marked diffuse intrahepatic biliary ductal dilatation with abrupt caliber transition at the biliary hilum. Suggestion of a poorly marginated enhancing obstructing 2.1 x 1.4 cm soft tissue mass at the biliary hilum (series 5/image 16) with associated mildly restricted diffusion on series 3/image 52. Common bile duct diameter 3 mm. No choledocholithiasis. Gallbladder is contracted with no gallstones. No definite gallbladder wall thickening  accounting for  contracted state. Pancreas: No pancreatic mass or duct dilation.  No pancreas divisum. Spleen: Normal size. No mass. Adrenals/Urinary Tract: Normal adrenals. No hydronephrosis. Scattered simple bilateral renal cortical cysts, largest 4.6 cm in the posterior lower right kidney. No suspicious renal masses. Stomach/Bowel: Normal non-distended stomach. Visualized small and large bowel is normal caliber, with no bowel wall thickening. Vascular/Lymphatic: Atherosclerotic nonaneurysmal abdominal aorta. Patent portal, splenic, hepatic and renal veins. Mildly enlarged 1.4 cm short axis diameter additional porta hepatis node (series 5/image 19). No pathologically enlarged lymph nodes in the abdomen. Other: No abdominal ascites or focal fluid collection. Musculoskeletal: No aggressive appearing focal osseous lesions. IMPRESSION: 1. Marked diffuse intrahepatic biliary ductal dilatation with abrupt caliber transition at the biliary hilum. Suggestion of a poorly marginated enhancing 2.1 x 1.4 cm soft tissue mass at the biliary hilum with associated mildly restricted diffusion. 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 ERCP with brush biopsy correlation already planned by report. 2. Mild porta hepatis lymphadenopathy, indeterminate for metastatic disease. Electronically Signed   By: Ilona Sorrel M.D.   On: 06/23/2021 10:48   DG ERCP BILIARY & PANCREATIC DUCTS  Result Date: 06/24/2021 CLINICAL DATA:  Obstructive jaundice, leukocytosis and possible Klatskin tumor. EXAM: ERCP TECHNIQUE: Multiple spot images obtained with the fluoroscopic device and submitted for interpretation post-procedure. FLUOROSCOPY TIME:  Fluoroscopy Time:  11 minutes 3 seconds Number of Acquired Spot Images: 0 COMPARISON:  None. FINDINGS: A total of 27 spot images are submitted for review. The images demonstrate a flexible duodenal scope in the descending duodenum with wire cannulation of both the  pancreatic and common bile duct. Cholangiogram is performed. There is no significant dilation of the common bile duct. The cystic duct is patent with contrast material entering the gallbladder lumen. Diffuse dilation of the intrahepatic ducts present. On the final images, both pancreatic and biliary duct plastic stents have been placed. IMPRESSION: 1. Intrahepatic biliary ductal dilatation without evidence of common bile duct dilation suggesting obstruction at the level of the hepatic confluence. 2. Placement of plastic pancreatic and biliary stents. These images were submitted for radiologic interpretation only. Please see the procedural report for the amount of contrast and the fluoroscopy time utilized. Electronically Signed   By: Jacqulynn Cadet M.D.   On: 06/24/2021 07:57   MR ABDOMEN MRCP W WO CONTAST  Result Date: 06/23/2021 CLINICAL DATA:  Obstructive jaundice and leukocytosis. Intrahepatic biliary ductal dilatation with concern for centrally obstructing biliary mass on recent CT. Planned for ERCP this evening. EXAM: MRI ABDOMEN WITHOUT AND WITH CONTRAST (INCLUDING MRCP) TECHNIQUE: Multiplanar multisequence MR imaging of the abdomen was performed both before and after the administration of intravenous contrast. Heavily T2-weighted images of the biliary and pancreatic ducts were obtained, and three-dimensional MRCP images were rendered by post processing. CONTRAST:  49m GADAVIST GADOBUTROL 1 MMOL/ML IV SOLN COMPARISON:  06/11/2021 CT abdomen/pelvis. FINDINGS: Lower chest: No acute abnormality at the lung bases. Hepatobiliary: Normal liver size and configuration. No hepatic steatosis. There is a 1.0 cm peripheral right liver dome hemangioma (series 26/image 26) with progressive discontinuous peripheral nodular enhancement. A few small lobulated and otherwise simple liver cysts are scattered in anterior left liver, largest 1.3 cm in the segment 4 left liver (series 5/image 18). There is marked diffuse  intrahepatic biliary ductal dilatation with abrupt caliber transition at the biliary hilum. Suggestion of a poorly marginated enhancing obstructing 2.1 x 1.4 cm soft tissue mass at the biliary hilum (series 5/image 16) with associated mildly restricted  diffusion on series 3/image 52. Common bile duct diameter 3 mm. No choledocholithiasis. Gallbladder is contracted with no gallstones. No definite gallbladder wall thickening accounting for contracted state. Pancreas: No pancreatic mass or duct dilation.  No pancreas divisum. Spleen: Normal size. No mass. Adrenals/Urinary Tract: Normal adrenals. No hydronephrosis. Scattered simple bilateral renal cortical cysts, largest 4.6 cm in the posterior lower right kidney. No suspicious renal masses. Stomach/Bowel: Normal non-distended stomach. Visualized small and large bowel is normal caliber, with no bowel wall thickening. Vascular/Lymphatic: Atherosclerotic nonaneurysmal abdominal aorta. Patent portal, splenic, hepatic and renal veins. Mildly enlarged 1.4 cm short axis diameter additional porta hepatis node (series 5/image 19). No pathologically enlarged lymph nodes in the abdomen. Other: No abdominal ascites or focal fluid collection. Musculoskeletal: No aggressive appearing focal osseous lesions. IMPRESSION: 1. Marked diffuse intrahepatic biliary ductal dilatation with abrupt caliber transition at the biliary hilum. Suggestion of a poorly marginated enhancing 2.1 x 1.4 cm soft tissue mass at the biliary hilum with associated mildly restricted diffusion. 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 ERCP with brush biopsy correlation already planned by report. 2. Mild porta hepatis lymphadenopathy, indeterminate for metastatic disease. Electronically Signed   By: Ilona Sorrel M.D.   On: 06/23/2021 10:48   CUP PACEART REMOTE DEVICE CHECK  Result Date: 06/24/2021 ILR summary report received. Battery status OK. Normal device  function. No new symptom, brady, or pause episodes. No new AF episodes. One previously viewed and reviewed tachycardia episode.  Monthly summary reports and ROV/PRN Kathy Breach, RN, CCDS, [employer]   ASSESSMENT & PLAN Ruben Chavez 70 y.o. male with medical history significant for HLD, HTN, and prostate cancer who presents for evaluation of an obstructing biliary mass concerning for cholangiocarcinoma.   After review of the labs, review of the records, and discussion with the patient the patients findings are most consistent with a localized cholangiocarcinoma.  This mass has the potential to be resectable and therefore I recommend prompt referral to a tertiary care center for consideration of resection.  This patient could be referred to [facility] or [facility] for this procedure.  If the mass is not found to be resectable we can have him return to our clinic for consideration of palliative chemotherapy.  If resection is feasible then we will have him return for palliative adjuvant chemotherapy, most likely with capecitabine.  Patient is status post stent placement and once he is medically stable would recommend discharge with prompt surgical follow-up.  We provided the patient with the contact information for our clinic in order to contact us once surgical decisions are made.  #Localized Intrahepatic Cholangiocarcinoma -- Findings are most consistent with an intrahepatic cholangiocarcinoma.  There is no evidence of other disease within the abdomen.  CT chest ordered to complete staging --Tumor markers ordered with an AFP of 2.8, CA 19-9 of 351, CEA of 4.9 --Agree with referral to tertiary care center for consideration of resection of this mass.  Patient noted that his insurance would cover [facility] and that this was closest to home.  Would recommend referral to Dr. Clovis Riley or Dr. Crisoforo Oxford with surgical oncology. --If patient is found to be  a candidate for surgical resection we will have him return to our clinic for adjuvant chemotherapy status post resection --If the patient is unresectable we will have him return to clinic for palliative chemotherapy --Oncology will continue to follow as needed   All questions were answered.  The patient knows to call the clinic with any problems, questions or concerns.  A total of more than 55 minutes were spent on this encounter with face-to-face time and non-face-to-face time, including preparing to see the patient, ordering tests and/or medications, counseling the patient and coordination of care as outlined above.   Ledell Peoples, MD Department of Hematology/Oncology [place] at [facility] Phone: [phone] Pager: [phone] Email: [email or web address]  06/25/2021 6:32 PM

## 2021-06-25 NOTE — Progress Notes (Signed)
[facility] Progress Note  CC:  Malignant biliary obstruction  Subjective:  Anxious for biopsy results, otherwise is feeling better.  Objective:  Vital signs in last 24 hours: Temp:  [97.9 F (36.6 C)-98.2 F (36.8 C)] 98.2 F (36.8 C) (07/21 0505) Pulse Rate:  [66-72] 68 (07/21 0505) Resp:  [18] 18 (07/21 0505) BP: (127-134)/(73-80) 134/74 (07/21 0505) SpO2:  [100 %] 100 % (07/21 0505) Last BM Date: 06/20/21 General:  Alert, chronically ill-appearing, in NAD;  deep jaundice noted. Heart:  Regular rate and rhythm; no murmurs Pulm:  CTAB.  No W/R/R. Abdomen:  Soft, non-distended.  BS present.  Non-tender. Extremities:  Without edema. Neurologic:  Alert and oriented x 4;  grossly normal neurologically.  Intake/Output from previous day: 07/20 0701 - 07/21 0700 In: 1474.1 [P.O.:420; I.V.:334; IV Piggyback:720.1] Out: 550 [Urine:550]  Lab Results: Recent Labs    06/23/21 0435 06/24/21 0937 06/25/21 0541  WBC 13.0* 15.7* 17.4*  HGB 10.1* 9.9* 10.2*  HCT 29.7* 29.4* 31.2*  PLT 439* 457* 447*   BMET Recent Labs    06/23/21 0435 06/24/21 0433 06/25/21 0541  NA 129* 129* 131*  K 3.4* 4.2 3.8  CL 95* 98 100  CO2 24 19* 20*  GLUCOSE 67* 122* 101*  BUN 25* 28* 24*  CREATININE 0.44* 0.66 0.60*  CALCIUM 9.2 9.1 9.0   LFT Recent Labs    06/25/21 0541  PROT 6.4*  ALBUMIN 2.1*  AST 181*  ALT 231*  ALKPHOS 1,435*  BILITOT 13.9*  BILIDIR 8.0*  IBILI 5.9*   PT/INR Recent Labs    06/22/21 1312  LABPROT 20.9*  INR 1.8*   MR 3D Recon At Scanner  Result Date: 06/23/2021 CLINICAL DATA:  Obstructive jaundice and leukocytosis. Intrahepatic biliary ductal dilatation with concern for centrally obstructing biliary mass on recent CT. Planned for ERCP this evening. EXAM: MRI ABDOMEN WITHOUT AND WITH CONTRAST (INCLUDING MRCP) TECHNIQUE: Multiplanar multisequence MR imaging of the abdomen was performed both before and after the administration of  intravenous contrast. Heavily T2-weighted images of the biliary and pancreatic ducts were obtained, and three-dimensional MRCP images were rendered by post processing. CONTRAST:  41mL GADAVIST GADOBUTROL 1 MMOL/ML IV SOLN COMPARISON:  06/11/2021 CT abdomen/pelvis. FINDINGS: Lower chest: No acute abnormality at the lung bases. Hepatobiliary: Normal liver size and configuration. No hepatic steatosis. There is a 1.0 cm peripheral right liver dome hemangioma (series 26/image 26) with progressive discontinuous peripheral nodular enhancement. A few small lobulated and otherwise simple liver cysts are scattered in anterior left liver, largest 1.3 cm in the segment 4 left liver (series 5/image 18). There is marked diffuse intrahepatic biliary ductal dilatation with abrupt caliber transition at the biliary hilum. Suggestion of a poorly marginated enhancing obstructing 2.1 x 1.4 cm soft tissue mass at the biliary hilum (series 5/image 16) with associated mildly restricted diffusion on series 3/image 52. Common bile duct diameter 3 mm. No choledocholithiasis. Gallbladder is contracted with no gallstones. No definite gallbladder wall thickening accounting for contracted state. Pancreas: No pancreatic mass or duct dilation.  No pancreas divisum. Spleen: Normal size. No mass. Adrenals/Urinary Tract: Normal adrenals. No hydronephrosis. Scattered simple bilateral renal cortical cysts, largest 4.6 cm in the posterior lower right kidney. No suspicious renal masses. Stomach/Bowel: Normal non-distended stomach. Visualized small and large bowel is normal caliber, with no bowel wall thickening. Vascular/Lymphatic: Atherosclerotic nonaneurysmal abdominal aorta. Patent portal, splenic, hepatic and renal veins. Mildly enlarged 1.4 cm short axis diameter additional porta hepatis node (series  5/image 19). No pathologically enlarged lymph nodes in the abdomen. Other: No abdominal ascites or focal fluid collection. Musculoskeletal: No aggressive  appearing focal osseous lesions. IMPRESSION: 1. Marked diffuse intrahepatic biliary ductal dilatation with abrupt caliber transition at the biliary hilum. Suggestion of a poorly marginated enhancing 2.1 x 1.4 cm soft tissue mass at the biliary hilum with associated mildly restricted diffusion. 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 ERCP with brush biopsy correlation already planned by report. 2. Mild porta hepatis lymphadenopathy, indeterminate for metastatic disease. Electronically Signed   By: Ilona Sorrel M.D.   On: 06/23/2021 10:48   DG ERCP BILIARY & PANCREATIC DUCTS  Result Date: 06/24/2021 CLINICAL DATA:  Obstructive jaundice, leukocytosis and possible Klatskin tumor. EXAM: ERCP TECHNIQUE: Multiple spot images obtained with the fluoroscopic device and submitted for interpretation post-procedure. FLUOROSCOPY TIME:  Fluoroscopy Time:  11 minutes 3 seconds Number of Acquired Spot Images: 0 COMPARISON:  None. FINDINGS: A total of 27 spot images are submitted for review. The images demonstrate a flexible duodenal scope in the descending duodenum with wire cannulation of both the pancreatic and common bile duct. Cholangiogram is performed. There is no significant dilation of the common bile duct. The cystic duct is patent with contrast material entering the gallbladder lumen. Diffuse dilation of the intrahepatic ducts present. On the final images, both pancreatic and biliary duct plastic stents have been placed. IMPRESSION: 1. Intrahepatic biliary ductal dilatation without evidence of common bile duct dilation suggesting obstruction at the level of the hepatic confluence. 2. Placement of plastic pancreatic and biliary stents. These images were submitted for radiologic interpretation only. Please see the procedural report for the amount of contrast and the fluoroscopy time utilized. Electronically Signed   By: Jacqulynn Cadet M.D.   On: 06/24/2021 07:57   MR  ABDOMEN MRCP W WO CONTAST  Result Date: 06/23/2021 CLINICAL DATA:  Obstructive jaundice and leukocytosis. Intrahepatic biliary ductal dilatation with concern for centrally obstructing biliary mass on recent CT. Planned for ERCP this evening. EXAM: MRI ABDOMEN WITHOUT AND WITH CONTRAST (INCLUDING MRCP) TECHNIQUE: Multiplanar multisequence MR imaging of the abdomen was performed both before and after the administration of intravenous contrast. Heavily T2-weighted images of the biliary and pancreatic ducts were obtained, and three-dimensional MRCP images were rendered by post processing. CONTRAST:  50mL GADAVIST GADOBUTROL 1 MMOL/ML IV SOLN COMPARISON:  06/11/2021 CT abdomen/pelvis. FINDINGS: Lower chest: No acute abnormality at the lung bases. Hepatobiliary: Normal liver size and configuration. No hepatic steatosis. There is a 1.0 cm peripheral right liver dome hemangioma (series 26/image 26) with progressive discontinuous peripheral nodular enhancement. A few small lobulated and otherwise simple liver cysts are scattered in anterior left liver, largest 1.3 cm in the segment 4 left liver (series 5/image 18). There is marked diffuse intrahepatic biliary ductal dilatation with abrupt caliber transition at the biliary hilum. Suggestion of a poorly marginated enhancing obstructing 2.1 x 1.4 cm soft tissue mass at the biliary hilum (series 5/image 16) with associated mildly restricted diffusion on series 3/image 52. Common bile duct diameter 3 mm. No choledocholithiasis. Gallbladder is contracted with no gallstones. No definite gallbladder wall thickening accounting for contracted state. Pancreas: No pancreatic mass or duct dilation.  No pancreas divisum. Spleen: Normal size. No mass. Adrenals/Urinary Tract: Normal adrenals. No hydronephrosis. Scattered simple bilateral renal cortical cysts, largest 4.6 cm in the posterior lower right kidney. No suspicious renal masses. Stomach/Bowel: Normal non-distended stomach.  Visualized small and large bowel is normal caliber, with no  bowel wall thickening. Vascular/Lymphatic: Atherosclerotic nonaneurysmal abdominal aorta. Patent portal, splenic, hepatic and renal veins. Mildly enlarged 1.4 cm short axis diameter additional porta hepatis node (series 5/image 19). No pathologically enlarged lymph nodes in the abdomen. Other: No abdominal ascites or focal fluid collection. Musculoskeletal: No aggressive appearing focal osseous lesions. IMPRESSION: 1. Marked diffuse intrahepatic biliary ductal dilatation with abrupt caliber transition at the biliary hilum. Suggestion of a poorly marginated enhancing 2.1 x 1.4 cm soft tissue mass at the biliary hilum with associated mildly restricted diffusion. 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 ERCP with brush biopsy correlation already planned by report. 2. Mild porta hepatis lymphadenopathy, indeterminate for metastatic disease. Electronically Signed   By: Ilona Sorrel M.D.   On: 06/23/2021 10:48   CUP PACEART REMOTE DEVICE CHECK  Result Date: 06/24/2021 ILR summary report received. Battery status OK. Normal device function. No new symptom, brady, or pause episodes. No new AF episodes. One previously viewed and reviewed tachycardia episode.  Monthly summary reports and ROV/PRN Kathy Breach, RN, CCDS, [employer]   Assessment / Plan: 1.  Elevated LFTs: Imaging question cholangiocarcinoma, leukocytosis suggesting ascending cholangitis.  ERCP with biliary cannulation and wire placement into the left system.  Biliary stent placed.  Prophylactic pancreas stent placed.  Gastric biopsies obtained due to severe gastritis.   Tumor markers pending.  Cytology and pathology pending.   LFTs trending down nicely so far, total bili down to 13.9K but ALP did bump up some.  Holding off any any PBD with IR for now.  Continue to trend.   Is on abx and should complete at least 5 days worth.  2.   Painless jaundice 3.  Leukocytosis:  WBC count up to 17.4K today.  Will monitor.  Is afebrile. 4.  Hyponatremia:  Na+ still 131 this AM.  **Hopefully we will see a decline in patient's WBC count tomorrow and then he should be ready for discharge.  I will have Dr. Rush Landmark clarify/outline his recommendations for antibiotics.     LOS: 3 days   Laban Emperor. Zyann Mabry  06/25/2021, 9:22 AM

## 2021-06-25 NOTE — Progress Notes (Signed)
Physical Therapy Treatment Patient Details Name: Ruben Chavez MRN: [id number] DOB: 11/24/1951 Today's Date: 06/25/2021    History of Present Illness Ruben Chavez is a 70 y.o. male with medical history significant for history cva with left sided hemiparesis, hypretension, who presents to the hospital with jaundice. Patient s/p ERCP 7/19.    PT Comments    Patient continues to be motivated to mobilize and progress with acute PT. He required min assist for sit<>stands and gait with SPC, cues for safe technique and to improve posture, step length, and cane sequencing with gait. Min assist provided to prevent anterior LOB due to forward trunk flexion and lean. EOS pt in recliner with wife in room. Acute PT will continue to progress pt as able.    Follow Up Recommendations  Home health PT;Outpatient PT;Supervision for mobility/OOB (OPPT neuro rehab)     Equipment Recommendations  None recommended by PT    Recommendations for Other Services       Precautions / Restrictions Precautions Precautions: Fall Precaution Comments: Left sided hemiparesis, L hand exhibits minimal function Restrictions Weight Bearing Restrictions: No    Mobility  Bed Mobility               General bed mobility comments: pt OOB in recliner    Transfers Overall transfer level: Needs assistance Equipment used: None Transfers: Sit to/from Stand Sit to Stand: Min assist         General transfer comment: cue for technique/Rt hand placement, extra time to process. 3 attemtps to fully rise from recliner with Min assist to power up.  Ambulation/Gait Ambulation/Gait assistance: Min assist Gait Distance (Feet): 100 Feet Assistive device: Straight cane Gait Pattern/deviations: Step-to pattern;Decreased step length - right;Decreased step length - left;Decreased stride length;Shuffle;Trunk flexed;Narrow base of support Gait velocity: decr   General Gait Details: pt with slow short steps and  flexed psoture. cues required throughout for upright posture and assist to prevent anterior lean. Visual cues of floor tile used to facilitate increased step length and greater distance for [facility] placement.   Stairs             Wheelchair Mobility    Modified Rankin (Stroke Patients Only)       Balance Overall balance assessment: Needs assistance Sitting-balance support: No upper extremity supported;Feet supported Sitting balance-Leahy Scale: Fair     Standing balance support: Single extremity supported Standing balance-Leahy Scale: Poor Standing balance comment: reliant on SPC and external support of therapist for gait                            Cognition Arousal/Alertness: Awake/alert Behavior During Therapy: WFL for tasks assessed/performed Overall Cognitive Status: No family/caregiver present to determine baseline cognitive functioning Area of Impairment: Problem solving                             Problem Solving: Slow processing;Difficulty sequencing;Requires verbal cues        Exercises      General Comments        Pertinent Vitals/Pain Pain Assessment: No/denies pain    Home Living                      Prior Function            PT Goals (current goals can now be found in the care plan section) Acute Rehab PT Goals  Patient Stated Goal: to get stronger PT Goal Formulation: With patient Time For Goal Achievement: 07/09/21 Potential to Achieve Goals: Good Progress towards PT goals: Progressing toward goals    Frequency    Min 3X/week      PT Plan Current plan remains appropriate    Co-evaluation              AM-PAC PT "6 Clicks" Mobility   Outcome Measure  Help needed turning from your back to your side while in a flat bed without using bedrails?: A Little Help needed moving from lying on your back to sitting on the side of a flat bed without using bedrails?: A Little Help needed moving to and  from a bed to a chair (including a wheelchair)?: A Little Help needed standing up from a chair using your arms (e.g., wheelchair or bedside chair)?: A Little Help needed to walk in hospital room?: A Little Help needed climbing 3-5 steps with a railing? : A Lot 6 Click Score: 17    End of Session Equipment Utilized During Treatment: Gait belt Activity Tolerance: Patient tolerated treatment well Patient left: in chair;with call bell/phone within reach;with chair alarm set;with family/visitor present Nurse Communication: Mobility status PT Visit Diagnosis: Other abnormalities of gait and mobility (R26.89)     Time: 3159-4585 PT Time Calculation (min) (ACUTE ONLY): 32 min  Charges:  $Gait Training: 23-37 mins                     Verner Mould, DPT Acute Rehabilitation Services Office [phone] Pager [phone]    Ruben Chavez 06/25/2021, 6:42 PM

## 2021-06-26 ENCOUNTER — Other Ambulatory Visit: Payer: Self-pay

## 2021-06-26 DIAGNOSIS — C801 Malignant (primary) neoplasm, unspecified: Secondary | ICD-10-CM | POA: Diagnosis not present

## 2021-06-26 DIAGNOSIS — K831 Obstruction of bile duct: Secondary | ICD-10-CM | POA: Diagnosis not present

## 2021-06-26 DIAGNOSIS — C240 Malignant neoplasm of extrahepatic bile duct: Secondary | ICD-10-CM

## 2021-06-26 LAB — COMPREHENSIVE METABOLIC PANEL
ALT: 195 U/L — ABNORMAL HIGH (ref 0–44)
AST: 142 U/L — ABNORMAL HIGH (ref 15–41)
Albumin: 2 g/dL — ABNORMAL LOW (ref 3.5–5.0)
Alkaline Phosphatase: 1253 U/L — ABNORMAL HIGH (ref 38–126)
Anion gap: 11 (ref 5–15)
BUN: 21 mg/dL (ref 8–23)
CO2: 21 mmol/L — ABNORMAL LOW (ref 22–32)
Calcium: 8.8 mg/dL — ABNORMAL LOW (ref 8.9–10.3)
Chloride: 101 mmol/L (ref 98–111)
Creatinine, Ser: 0.52 mg/dL — ABNORMAL LOW (ref 0.61–1.24)
GFR, Estimated: >60 mL/min (ref >60)
Glucose, Bld: 94 mg/dL (ref 70–99)
Potassium: 3.4 mmol/L — ABNORMAL LOW (ref 3.5–5.1)
Sodium: 133 mmol/L — ABNORMAL LOW (ref 135–145)
Total Bilirubin: 12.8 mg/dL — ABNORMAL HIGH (ref 0.3–1.2)
Total Protein: 6 g/dL — ABNORMAL LOW (ref 6.5–8.1)

## 2021-06-26 LAB — PROTIME-INR
INR: 1.1 (ref 0.8–1.2)
Prothrombin Time: 14.4 seconds (ref 11.4–15.2)

## 2021-06-26 LAB — CBC
HCT: 27.2 % — ABNORMAL LOW (ref 39.0–52.0)
Hemoglobin: 9 g/dL — ABNORMAL LOW (ref 13.0–17.0)
MCH: 30.2 pg (ref 26.0–34.0)
MCHC: 33.1 g/dL (ref 30.0–36.0)
MCV: 91.3 fL (ref 80.0–100.0)
Platelets: 414 10*3/uL — ABNORMAL HIGH (ref 150–400)
RBC: 2.98 MIL/uL — ABNORMAL LOW (ref 4.22–5.81)
RDW: 17.2 % — ABNORMAL HIGH (ref 11.5–15.5)
WBC: 13.2 10*3/uL — ABNORMAL HIGH (ref 4.0–10.5)
nRBC: 0 % (ref 0.0–0.2)

## 2021-06-26 LAB — SURGICAL PATHOLOGY

## 2021-06-26 MED ORDER — CIPROFLOXACIN HCL 500 MG PO TABS: 500.0000 mg | ORAL_TABLET | Freq: Two times a day (BID) | ORAL | 0 refills | Status: AC

## 2021-06-26 MED ORDER — POTASSIUM CHLORIDE 20 MEQ PO PACK
40.0000 meq | PACK | Freq: Once | ORAL | Status: AC
  Administered 2021-06-26: 40 meq via ORAL
  Filled 2021-06-26: qty 2

## 2021-06-26 MED ORDER — ASPIRIN 81 MG PO TBEC: 81.0000 mg | DELAYED_RELEASE_TABLET | Freq: Every day | ORAL | 1 refills | Status: DC

## 2021-06-26 NOTE — Progress Notes (Addendum)
[facility] Progress Note  CC:  Malignant biliary obstruction  Subjective:  Feeling better each day.  Eating well.  No abdominal pain.  Objective:  Vital signs in last 24 hours: Temp:  [98 F (36.7 C)-98.4 F (36.9 C)] 98.4 F (36.9 C) (07/22 0435) Pulse Rate:  [73-84] 84 (07/22 0435) Resp:  [16-18] 18 (07/22 0435) BP: (127-148)/(72-86) 148/86 (07/22 0435) SpO2:  [98 %-100 %] 99 % (07/22 0435) Last BM Date: 06/20/21 General:  Alert, chronically ill-appearing, in NAD Heart:  Regular rate and rhythm; no murmurs Pulm:  CTAB.  No W/R/R. Abdomen:  Soft, non-distended.  BS present.  Non-tender. Extremities:  Without edema.  Intake/Output from previous day: 07/21 0701 - 07/22 0700 In: 782.9 [P.O.:440; IV Piggyback:342.9] Out: -  Intake/Output this shift: Total I/O In: -  Out: 800 [Urine:800]  Lab Results: Recent Labs    06/24/21 0937 06/25/21 0541 06/26/21 0444  WBC 15.7* 17.4* 13.2*  HGB 9.9* 10.2* 9.0*  HCT 29.4* 31.2* 27.2*  PLT 457* 447* 414*   BMET Recent Labs    06/24/21 0433 06/25/21 0541 06/26/21 0444  NA 129* 131* 133*  K 4.2 3.8 3.4*  CL 98 100 101  CO2 19* 20* 21*  GLUCOSE 122* 101* 94  BUN 28* 24* 21  CREATININE 0.66 0.60* 0.52*  CALCIUM 9.1 9.0 8.8*   LFT Recent Labs    06/25/21 0541 06/26/21 0444  PROT 6.4* 6.0*  ALBUMIN 2.1* 2.0*  AST 181* 142*  ALT 231* 195*  ALKPHOS 1,435* 1,253*  BILITOT 13.9* 12.8*  BILIDIR 8.0*  --   IBILI 5.9*  --    PT/INR Recent Labs    06/25/21 1214 06/26/21 0444  LABPROT 16.2* 14.4  INR 1.3* 1.1   CT CHEST W CONTRAST  Result Date: 06/25/2021 CLINICAL DATA:  Pancreatic cancer, staging EXAM: CT CHEST WITH CONTRAST TECHNIQUE: Multidetector CT imaging of the chest was performed during intravenous contrast administration. CONTRAST:  14m OMNIPAQUE IOHEXOL 350 MG/ML SOLN COMPARISON:  06/11/2021 FINDINGS: Cardiovascular: The heart and great vessels are unremarkable without pericardial  effusion. Ectasia of the ascending thoracic aorta measuring up to 3.9 cm, without aneurysm or dissection. Moderate atherosclerosis of the aortic arch and coronary vasculature. Mediastinum/Nodes: No enlarged mediastinal, hilar, or axillary lymph nodes. Thyroid gland, trachea, and esophagus demonstrate no significant findings. Lungs/Pleura: No acute airspace disease, effusion, or pneumothorax. Minimal hypoventilatory changes within the dependent lower lobes. Central airways are patent. 8 x 6 x 7 mm nodule within the superior segment left lower lobe reference image 65/5. No other pulmonary nodules or masses. Upper Abdomen: Interval biliary duct stent placement. Persistent intrahepatic biliary duct dilation, with pneumobilia compatible with recent intervention. Please refer to MRI report describing obstructing mass at the porta hepatis. Musculoskeletal: No acute or destructive bony lesions. Loop recorder left anterior chest wall. Reconstructed images demonstrate no additional findings. IMPRESSION: 1. Indeterminate left lower lobe pulmonary nodule with mean diameter of 7 mm. Metastatic disease cannot be excluded, and close attention on follow-up is recommended. 2. Ectasia of the ascending thoracic aorta without frank aneurysm, maximal diameter 3.9 cm. 3. Interval placement of a common bile duct stent, with pneumobilia and persistent intrahepatic duct dilation as above. 4.  Aortic Atherosclerosis (ICD10-I70.0). Electronically Signed   By: MRanda NgoM.D.   On: 06/25/2021 20:11    Assessment / Plan: 1.  Elevated LFTs: Imaging question cholangiocarcinoma, leukocytosis suggesting ascending cholangitis.  ERCP with biliary cannulation and wire placement into the left system.  Biliary stent placed.  Prophylactic pancreas stent placed.  Gastric biopsies obtained due to severe gastritis.   CA 19-9 is 351.  Cytology showed adenocarcinoma.  Gastric biopsies just chronic gastritis without Hpylori.   LFTs trending down  nicely so far, total bili down to 12.8 and ALP down some again as well  Holding off any any PBD with IR for now.  Continue to trend.   Is on abx and should compete a total of 7 days per Dr. Donneta Romberg note from 7/21.  2.  Painless jaundice 3.  Leukocytosis:  WBC count down to 13.2K today.  Will monitor.  Is afebrile. 4.  Hyponatremia:  Na+ 133 this AM.   **Ok for discharge today.  Dr. Rush Landmark trying to coordinate surgical referral.  Oncology aware of patient as well.   LOS: 4 days   Laban Emperor. Zehr  06/26/2021, 9:21 AM      Attending 1 Attestation   I have taken an interval history, reviewed the chart and examined the patient.   My outpatient nurse will be working on coordinating a [facility] referral to surgical oncology (pancreaticobiliary).  Patient to continue antibiotics for a total of a 7-day course.  Hopefully will continue to see improvement in his liver tests.  We will plan to repeat LFTs in 1 week (will be placed under his primary GI name as I will be away next week) just to ensure that LFTs are improving.  Hopefully he will be a candidate for surgery and hopefully the stent will be enough.  If patient has continued jaundice after 2 weeks of the stent placement then he may require a percutaneous biliary drain.  Time will tell.  Patient appreciative for the help he is received.  I agree with the Advanced Practitioner's note, impression, and recommendations with updates and my documentation above.   Justice Britain, MD [facility] Advanced Endoscopy Office # [phone]

## 2021-06-26 NOTE — Progress Notes (Signed)
Cmp

## 2021-06-26 NOTE — Progress Notes (Signed)
Brief oncology note:  Message sent to GI navigator at the cancer center to contact [facility] surgical oncology to arrange for evaluation for consideration of surgical resection.  Mikey Bussing, DNP, AGPCNP-BC, AOCNP

## 2021-06-26 NOTE — Progress Notes (Signed)
Occupational Therapy Treatment Patient Details Name: Ruben Chavez MRN: [id number] DOB: 25-Oct-1951 Today's Date: 06/26/2021    History of present illness Ruben Chavez is a 70 y.o. male with medical history significant for history cva with left sided hemiparesis, hypretension, who presents to the hospital with jaundice. Patient s/p ERCP 7/19.   OT comments  Patient was seated in bed waiting for lunch items at start of session. Patient was min guard for supine to sit on edge of bed with increased time and HOB raised. Patient was noted to have slight edema in L hand with patient having poor awareness of hand location during bed mobility. Patient was min A to transfer with cane from edge of bed to recliner in room. Patient was perseverating on d/c during session. D/c plan remains appropriate.    Follow Up Recommendations  No OT follow up    Equipment Recommendations  None recommended by OT    Recommendations for Other Services      Precautions / Restrictions Precautions Precautions: Fall Precaution Comments: Left sided hemiparesis, L hand exhibits minimal function Restrictions Weight Bearing Restrictions: No       Mobility Bed Mobility Overal bed mobility: Needs Assistance Bed Mobility: Supine to Sit     Supine to sit: Min assist;HOB elevated          Transfers Overall transfer level: Needs assistance Equipment used: Straight cane Transfers: Sit to/from Stand Sit to Stand: Min assist         General transfer comment: cue for technique/Rt hand placement, extra time to process. .    Balance                                           ADL either performed or assessed with clinical judgement   ADL Overall ADL's : Needs assistance/impaired Eating/Feeding: Set up;Sitting                                   Functional mobility during ADLs: Minimal assistance;Cane;Cueing for safety;Cueing for sequencing       Vision        Perception     Praxis      Cognition Arousal/Alertness: Awake/alert Behavior During Therapy: WFL for tasks assessed/performed Overall Cognitive Status: No family/caregiver present to determine baseline cognitive functioning                                          Exercises     Shoulder Instructions       General Comments      Pertinent Vitals/ Pain       Pain Assessment: No/denies pain  Home Living                                          Prior Functioning/Environment              Frequency  Min 2X/week        Progress Toward Goals  OT Goals(current goals can now be found in the care plan section)  Progress towards OT goals: Progressing toward goals  Acute Rehab OT Goals Patient Stated  Goal: to get stronger OT Goal Formulation: With patient Time For Goal Achievement: 07/08/21 Potential to Achieve Goals: Good  Plan Discharge plan remains appropriate    Co-evaluation                 AM-PAC OT "6 Clicks" Daily Activity     Outcome Measure   Help from another person eating meals?: A Little Help from another person taking care of personal grooming?: A Little Help from another person toileting, which includes using toliet, bedpan, or urinal?: A Little   Help from another person to put on and taking off regular upper body clothing?: A Little Help from another person to put on and taking off regular lower body clothing?: A Little 6 Click Score: 15    End of Session Equipment Utilized During Treatment: Gait belt  OT Visit Diagnosis: Unsteadiness on feet (R26.81);Muscle weakness (generalized) (M62.81)   Activity Tolerance Patient tolerated treatment well   Patient Left in chair;with call bell/phone within reach;with chair alarm set   Nurse Communication Other (comment) (convayed patients questions about d/c.)        Time: 1204-1232 OT Time Calculation (min): 28 min  Charges: OT General Charges $OT  Visit: 1 Visit OT Treatments $Self Care/Home Management : 23-37 mins  Jackelyn Poling OTR/L, [facility] Office# [phone] Pager# [phone]    Ruben Chavez 06/26/2021, 12:38 PM

## 2021-06-26 NOTE — Progress Notes (Signed)
Patient will be discharging home with family later this afternoon. Belongings were returned and education on medications will be provided.

## 2021-06-26 NOTE — Discharge Summary (Addendum)
Physician Discharge Summary  Ruben Chavez [id number] DOB: [birth year] DOA: 06/22/2021  PCP: Isaac Bliss, Rayford Halsted, MD  Admit date: 06/22/2021  Discharge date: 06/26/2021  Admitted From: Home. Disposition: Home Health services  Recommendations for Outpatient Follow-up:  Follow up with PCP in 1-2 weeks. Please obtain BMP/CBC in one week. Advised to follow-up with GI Dr. Rush Landmark in 2 weeks.   Advised to take ciprofloxacin 500 mg twice daily for 4 more days. Advised to follow-up with general surgical team at [place]: Home PT Equipment/Devices: None  Discharge Condition: Good CODE STATUS:Full code Diet recommendation: Heart Healthy   Brief [facility] Course: This 70 years old male with PMH significant for hypertension and remote CVA presented in the ED with yellow discoloration.  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  Patient reports 1 month history of worsening painless jaundice, over the last 2 weeks prior to hospitalization he has developed extreme generalized weakness and poor appetite which prompted him to come to the emergency department.  MRCP with diffuse intrahepatic biliary duct dilatation with  soft tissue mass at biliary hilum suspicious for cholangiocarcinoma.  GI was consulted patient underwent ERCP , Patient underwent biliary sphincterectomy, stricture dilatation and plastic biliary stent was placed, brushings were obtained for cytology.  Patient liver enzymes trended down, bilirubin has improved , patient tolerated clear liquid diet,  advance to full liquids to soft.  Patient cleared from GI to be discharged on ciprofloxacin twice a day for 4 more days to complete 7-day treatment.  Patient will follow-up with [facility] for surgical oncology. Patient feels better and wants to be discharged.   He was managed for below problems.  Discharge Diagnoses:  Principal Problem:   Obstructive  jaundice due to malignant neoplasm [facility]) Active Problems:   Essential hypertension   Hyperlipidemia   Stroke (HCC)   Bile duct obstruction   Klatskin's tumor (HCC)   Malnutrition of moderate degree  Obstructive jaundice, common hepatic duct obstruction, Klatskin tumor. Biliary adenocarcinoma.  Patient sp ERCP and stent placement in biliary duct and pancreatic duct. Pathology positive for malignant cells consistent with adenocarcinoma. Gastric biopsy negative for H Pylori. Positive for gastritis. Patient denies nausea or vomiting tolerated p.o. well. Today with worsening wbc is 17,4 . LFTs and total bilirubin trending down. Plan to continue antibiotic therapy with cefepime and metronidazole for now. WBC count has improved,  patient remains afebrile, GI cleared the patient for discharge. Patient will follow up with GI in 1 week. Appointment has been made with [facility] surgical team for possible surgical resection of the mass.    HTN. Patient at home on lisinopril and hctz.  Resume blood pressure medications.   CVA hx. On asa, resume statin when LFT' back to baseline. Chronic right upper extremity weakness.     Depression/ insomnia. Continue with trazodone and wellbutrin.   Hyponatremia and hypokalemia/ non anion gap metabolic acidosis. SIADH. Patient is tolerating po well, renal function back to normal.  Electrolytes improved.   Moderate calorie protein malnutrition. Continue with nutritional supplements. Follow with PT recommendations.      Discharge Instructions  Discharge Instructions     Call MD for:  difficulty breathing, headache or visual disturbances   Complete by: As directed    Call MD for:  persistant dizziness or light-headedness   Complete by: As directed    Call MD for:  persistant nausea and vomiting   Complete by: As directed  Call MD for:  severe uncontrolled pain   Complete by: As directed    Diet - low sodium heart healthy   Complete by:  As directed    Diet Carb Modified   Complete by: As directed    Discharge instructions   Complete by: As directed    Advised to follow-up with primary care physician in 1 week. Advised to follow-up with GI Dr. Rush Landmark in 2 weeks.   Advised to take ciprofloxacin 500 mg twice daily for 4 more days.   Increase activity slowly   Complete by: As directed       Allergies as of 06/26/2021       Reactions   Sulfa Antibiotics Swelling, Other (See Comments)   Swelling in ankles   Advil [ibuprofen] Other (See Comments)   "Dots on chest" (Petechiae)   Aleve [naproxen] Other (See Comments)   "Dots on chest" (Petechiae)   Betadine [povidone Iodine] Itching, Rash   Chloroxylenol (antiseptic) Itching, Rash, Other (See Comments)   PCMX surgical sterilizing scrub   Poison Ivy Extract Rash   Povidone-iodine Itching, Rash   BETADINE        Medication List     STOP taking these medications    atorvastatin 20 MG tablet Commonly known as: LIPITOR   senna-docusate 8.6-50 MG tablet Commonly known as: Senokot-S   triamcinolone cream 0.1 % Commonly known as: KENALOG       TAKE these medications    acetaminophen 500 MG tablet Commonly known as: TYLENOL Take 500 mg by mouth daily as needed (pain).   aspirin 81 MG EC tablet Take 1 tablet (81 mg total) by mouth daily. Swallow whole. Start taking on: June 27, 2021 What changed:  medication strength how much to take additional instructions   buPROPion 300 MG 24 hr tablet Commonly known as: WELLBUTRIN XL TAKE 1 TABLET(300 MG) BY MOUTH DAILY What changed: See the new instructions.   ciprofloxacin 500 MG tablet Commonly known as: CIPRO Take 1 tablet (500 mg total) by mouth 2 (two) times daily for 4 days.   folic acid 1 MG tablet Commonly known as: FOLVITE TAKE 1 TABLET(1 MG) BY MOUTH DAILY What changed: See the new instructions.   lisinopril-hydrochlorothiazide 20-25 MG tablet Commonly known as: ZESTORETIC TAKE 1 TABLET  BY MOUTH DAILY   Magnesium 250 MG Tabs Take 250 mg by mouth every morning.   multivitamin with minerals Tabs tablet Take 1 tablet by mouth daily.   pantoprazole 40 MG tablet Commonly known as: PROTONIX Take 1 tablet (40 mg total) by mouth daily.   polyethylene glycol 17 g packet Commonly known as: MIRALAX / GLYCOLAX Take 17 g by mouth daily as needed (constipation).   traZODone 100 MG tablet Commonly known as: DESYREL TAKE 1 TABLET(100 MG) BY MOUTH AT BEDTIME AS NEEDED FOR SLEEP What changed: See the new instructions.               Durable Medical Equipment  (From admission, onward)           Start     Ordered   06/26/21 1203  For home use only DME standard manual wheelchair with seat cushion  Once       Comments: Patient suffers from previous stroke with residual left sided weakness which impairs their ability to perform daily activities like toileting and dressing in the home.  A cane will not resolve issue with performing activities of daily living. A wheelchair will allow patient to safely perform daily  activities. Patient can safely propel the wheelchair in the home or has a caregiver who can provide assistance. Length of need lifetime. Accessories: elevating leg rests (ELRs), wheel locks, extensions and anti-tippers.   06/26/21 1218   06/26/21 1144  For home use only DME Wheelchair electric  Once        06/26/21 1144   06/26/21 1127  For home use only DME Hospital bed  Once       Question Answer Comment  Length of Need Lifetime   Patient has (list medical condition): Generalsed weakness   The above medical condition requires: Patient requires the ability to reposition frequently   Bed type Semi-electric   Support Surface: Low Air loss Mattress      06/26/21 [place] [place] Follow up.   Why: This is the supplier of your bed and wheelchair Contact information: [place]  [address] [phone]         Isaac Bliss, Rayford Halsted, MD Follow up in 1 week(s).   Specialty: Internal Medicine Contact information: [place] [address] [phone]         Mansouraty, Telford Nab., MD Follow up in 2 week(s).   Specialties: Gastroenterology, Internal Medicine Contact information: [place] [address] [phone]                Allergies  Allergen Reactions   Sulfa Antibiotics Swelling and Other (See Comments)    Swelling in ankles    Advil [Ibuprofen] Other (See Comments)    "Dots on chest" (Petechiae)   Aleve [Naproxen] Other (See Comments)    "Dots on chest" (Petechiae)   Betadine [Povidone Iodine] Itching and Rash   Chloroxylenol (Antiseptic) Itching, Rash and Other (See Comments)    PCMX surgical sterilizing scrub   Poison Ivy Extract Rash   Povidone-Iodine Itching and Rash    BETADINE    Consultations: GI IR ONCOLOGY   Procedures/Studies: CT ABDOMEN PELVIS W WO CONTRAST  Result Date: 06/11/2021 CLINICAL DATA:  Weight loss, anorexia and jaundice. Evaluate for malignancy. EXAM: CT ABDOMEN AND PELVIS WITHOUT AND WITH CONTRAST TECHNIQUE: Multidetector CT imaging of the abdomen and pelvis was performed following the standard protocol before and following the bolus administration of intravenous contrast. CONTRAST:  125m OMNIPAQUE IOHEXOL 300 MG/ML  SOLN COMPARISON:  Renal scratch set CT AP 11/14/2019. FINDINGS: Lower chest: No acute abnormality. Hepatobiliary: There is moderate to marked intrahepatic bile duct dilatation. Dilated intrahepatic bile ducts converge centrally. The common bile duct is nondilated. At the convergence of the dilated bile ducts there is focal luminal narrowing of the portal vein, image 50/12. Poorly defined (and difficult to measure) infiltrative soft tissue within the convergence of the intrahepatic bile ducts measures approximately 2.2 x 1.6 cm, image 30/10 and image 47/12.  The 2 fluid attenuating structures are noted within segment 4 and segment 2 which are favored to represent small cysts. The gallbladder appears collapsed. Pancreas: No signs of pancreatic mass, inflammation or main duct dilatation. Spleen: Normal in size without focal abnormality. Adrenals/Urinary Tract: Normal appearance of the adrenal glands. 4 mm nonobstructing stone identified within inferior pole of the right kidney. Simple appearing cyst arises off the inferior pole of the right kidney measuring 5.1 cm, image 49/7. No hydronephrosis identified bilaterally. Urinary bladder is largely obscured by beam hardening artifact from patient's arthroplasty devices. Stomach/Bowel:  Stomach appears normal. The appendix is visualized and is within normal limits. No bowel wall thickening, inflammation, or distension. Vascular/Lymphatic: Aortic atherosclerosis. No aneurysm. No adenopathy identified within the abdomen or pelvis. Reproductive: Seed implants noted within the prostate gland. Other: No free fluid or fluid collections. Musculoskeletal: There is no acute or suspicious osseous findings. The multilevel degenerative disc disease identified within the lumbar spine. IMPRESSION: 1. There is moderate to marked intrahepatic bile duct dilatation with transition to normal caliber common bile duct. There is a ill-defined, infiltrative soft tissue within the convergence of the dilated intrahepatic bile ducts. The diagnosis of exclusion is cholangiocarcinoma. More definitive characterization with contrast enhanced MRI/MRCP is recommended. 2. No evidence for nodal metastasis or solid organ metastasis within the abdomen or pelvis. 3. Nonobstructing right renal calculus. 4. Aortic atherosclerosis. Aortic Atherosclerosis (ICD10-I70.0). These results will be called to the ordering clinician or representative by the Radiologist Assistant, and communication documented in the PACS or [employer]. Electronically Signed   By: Kerby Moors M.D.   On: 06/11/2021 16:13   CT CHEST W CONTRAST  Result Date: 06/25/2021 CLINICAL DATA:  Pancreatic cancer, staging EXAM: CT CHEST WITH CONTRAST TECHNIQUE: Multidetector CT imaging of the chest was performed during intravenous contrast administration. CONTRAST:  51m OMNIPAQUE IOHEXOL 350 MG/ML SOLN COMPARISON:  06/11/2021 FINDINGS: Cardiovascular: The heart and great vessels are unremarkable without pericardial effusion. Ectasia of the ascending thoracic aorta measuring up to 3.9 cm, without aneurysm or dissection. Moderate atherosclerosis of the aortic arch and coronary vasculature. Mediastinum/Nodes: No enlarged mediastinal, hilar, or axillary lymph nodes. Thyroid gland, trachea, and esophagus demonstrate no significant findings. Lungs/Pleura: No acute airspace disease, effusion, or pneumothorax. Minimal hypoventilatory changes within the dependent lower lobes. Central airways are patent. 8 x 6 x 7 mm nodule within the superior segment left lower lobe reference image 65/5. No other pulmonary nodules or masses. Upper Abdomen: Interval biliary duct stent placement. Persistent intrahepatic biliary duct dilation, with pneumobilia compatible with recent intervention. Please refer to MRI report describing obstructing mass at the porta hepatis. Musculoskeletal: No acute or destructive bony lesions. Loop recorder left anterior chest wall. Reconstructed images demonstrate no additional findings. IMPRESSION: 1. Indeterminate left lower lobe pulmonary nodule with mean diameter of 7 mm. Metastatic disease cannot be excluded, and close attention on follow-up is recommended. 2. Ectasia of the ascending thoracic aorta without frank aneurysm, maximal diameter 3.9 cm. 3. Interval placement of a common bile duct stent, with pneumobilia and persistent intrahepatic duct dilation as above. 4.  Aortic Atherosclerosis (ICD10-I70.0). Electronically Signed   By: MRanda NgoM.D.   On: 06/25/2021 20:11   MR 3D Recon At  Scanner  Result Date: 06/23/2021 CLINICAL DATA:  Obstructive jaundice and leukocytosis. Intrahepatic biliary ductal dilatation with concern for centrally obstructing biliary mass on recent CT. Planned for ERCP this evening. EXAM: MRI ABDOMEN WITHOUT AND WITH CONTRAST (INCLUDING MRCP) TECHNIQUE: Multiplanar multisequence MR imaging of the abdomen was performed both before and after the administration of intravenous contrast. Heavily T2-weighted images of the biliary and pancreatic ducts were obtained, and three-dimensional MRCP images were rendered by post processing. CONTRAST:  714mGADAVIST GADOBUTROL 1 MMOL/ML IV SOLN COMPARISON:  06/11/2021 CT abdomen/pelvis. FINDINGS: Lower chest: No acute abnormality at the lung bases. Hepatobiliary: Normal liver size and configuration. No hepatic steatosis. There is a 1.0 cm peripheral right liver dome hemangioma (series 26/image 26) with progressive discontinuous peripheral nodular enhancement. A few small lobulated and otherwise simple liver cysts are scattered in anterior  left liver, largest 1.3 cm in the segment 4 left liver (series 5/image 18). There is marked diffuse intrahepatic biliary ductal dilatation with abrupt caliber transition at the biliary hilum. Suggestion of a poorly marginated enhancing obstructing 2.1 x 1.4 cm soft tissue mass at the biliary hilum (series 5/image 16) with associated mildly restricted diffusion on series 3/image 52. Common bile duct diameter 3 mm. No choledocholithiasis. Gallbladder is contracted with no gallstones. No definite gallbladder wall thickening accounting for contracted state. Pancreas: No pancreatic mass or duct dilation.  No pancreas divisum. Spleen: Normal size. No mass. Adrenals/Urinary Tract: Normal adrenals. No hydronephrosis. Scattered simple bilateral renal cortical cysts, largest 4.6 cm in the posterior lower right kidney. No suspicious renal masses. Stomach/Bowel: Normal non-distended stomach. Visualized small and  large bowel is normal caliber, with no bowel wall thickening. Vascular/Lymphatic: Atherosclerotic nonaneurysmal abdominal aorta. Patent portal, splenic, hepatic and renal veins. Mildly enlarged 1.4 cm short axis diameter additional porta hepatis node (series 5/image 19). No pathologically enlarged lymph nodes in the abdomen. Other: No abdominal ascites or focal fluid collection. Musculoskeletal: No aggressive appearing focal osseous lesions. IMPRESSION: 1. Marked diffuse intrahepatic biliary ductal dilatation with abrupt caliber transition at the biliary hilum. Suggestion of a poorly marginated enhancing 2.1 x 1.4 cm soft tissue mass at the biliary hilum with associated mildly restricted diffusion. 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 ERCP with brush biopsy correlation already planned by report. 2. Mild porta hepatis lymphadenopathy, indeterminate for metastatic disease. Electronically Signed   By: Ilona Sorrel M.D.   On: 06/23/2021 10:48   DG ERCP BILIARY & PANCREATIC DUCTS  Result Date: 06/24/2021 CLINICAL DATA:  Obstructive jaundice, leukocytosis and possible Klatskin tumor. EXAM: ERCP TECHNIQUE: Multiple spot images obtained with the fluoroscopic device and submitted for interpretation post-procedure. FLUOROSCOPY TIME:  Fluoroscopy Time:  11 minutes 3 seconds Number of Acquired Spot Images: 0 COMPARISON:  None. FINDINGS: A total of 27 spot images are submitted for review. The images demonstrate a flexible duodenal scope in the descending duodenum with wire cannulation of both the pancreatic and common bile duct. Cholangiogram is performed. There is no significant dilation of the common bile duct. The cystic duct is patent with contrast material entering the gallbladder lumen. Diffuse dilation of the intrahepatic ducts present. On the final images, both pancreatic and biliary duct plastic stents have been placed. IMPRESSION: 1. Intrahepatic biliary ductal  dilatation without evidence of common bile duct dilation suggesting obstruction at the level of the hepatic confluence. 2. Placement of plastic pancreatic and biliary stents. These images were submitted for radiologic interpretation only. Please see the procedural report for the amount of contrast and the fluoroscopy time utilized. Electronically Signed   By: Jacqulynn Cadet M.D.   On: 06/24/2021 07:57   MR ABDOMEN MRCP W WO CONTAST  Result Date: 06/23/2021 CLINICAL DATA:  Obstructive jaundice and leukocytosis. Intrahepatic biliary ductal dilatation with concern for centrally obstructing biliary mass on recent CT. Planned for ERCP this evening. EXAM: MRI ABDOMEN WITHOUT AND WITH CONTRAST (INCLUDING MRCP) TECHNIQUE: Multiplanar multisequence MR imaging of the abdomen was performed both before and after the administration of intravenous contrast. Heavily T2-weighted images of the biliary and pancreatic ducts were obtained, and three-dimensional MRCP images were rendered by post processing. CONTRAST:  79m GADAVIST GADOBUTROL 1 MMOL/ML IV SOLN COMPARISON:  06/11/2021 CT abdomen/pelvis. FINDINGS: Lower chest: No acute abnormality at the lung bases. Hepatobiliary: Normal liver size and configuration. No hepatic steatosis. There is a 1.0 cm peripheral  right liver dome hemangioma (series 26/image 26) with progressive discontinuous peripheral nodular enhancement. A few small lobulated and otherwise simple liver cysts are scattered in anterior left liver, largest 1.3 cm in the segment 4 left liver (series 5/image 18). There is marked diffuse intrahepatic biliary ductal dilatation with abrupt caliber transition at the biliary hilum. Suggestion of a poorly marginated enhancing obstructing 2.1 x 1.4 cm soft tissue mass at the biliary hilum (series 5/image 16) with associated mildly restricted diffusion on series 3/image 52. Common bile duct diameter 3 mm. No choledocholithiasis. Gallbladder is contracted with no  gallstones. No definite gallbladder wall thickening accounting for contracted state. Pancreas: No pancreatic mass or duct dilation.  No pancreas divisum. Spleen: Normal size. No mass. Adrenals/Urinary Tract: Normal adrenals. No hydronephrosis. Scattered simple bilateral renal cortical cysts, largest 4.6 cm in the posterior lower right kidney. No suspicious renal masses. Stomach/Bowel: Normal non-distended stomach. Visualized small and large bowel is normal caliber, with no bowel wall thickening. Vascular/Lymphatic: Atherosclerotic nonaneurysmal abdominal aorta. Patent portal, splenic, hepatic and renal veins. Mildly enlarged 1.4 cm short axis diameter additional porta hepatis node (series 5/image 19). No pathologically enlarged lymph nodes in the abdomen. Other: No abdominal ascites or focal fluid collection. Musculoskeletal: No aggressive appearing focal osseous lesions. IMPRESSION: 1. Marked diffuse intrahepatic biliary ductal dilatation with abrupt caliber transition at the biliary hilum. Suggestion of a poorly marginated enhancing 2.1 x 1.4 cm soft tissue mass at the biliary hilum with associated mildly restricted diffusion. 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 ERCP with brush biopsy correlation already planned by report. 2. Mild porta hepatis lymphadenopathy, indeterminate for metastatic disease. Electronically Signed   By: Ilona Sorrel M.D.   On: 06/23/2021 10:48   CUP PACEART REMOTE DEVICE CHECK  Result Date: 06/24/2021 ILR summary report received. Battery status OK. Normal device function. No new symptom, brady, or pause episodes. No new AF episodes. One previously viewed and reviewed tachycardia episode.  Monthly summary reports and ROV/PRN Kathy Breach, RN, CCDS, [employer]   MRCP, ERCP   Subjective: Patient was seen and examined at bedside.  Overnight events noted.  Patient reports feeling much improved.  Pain has improved,  he has  tolerated full liquid diet.  Patient want to be discharged  Discharge Exam: Vitals:   06/25/21 2024 06/26/21 0435  BP: (!) 142/76 (!) 148/86  Pulse: 77 84  Resp: 18 18  Temp: 98 F (36.7 C) 98.4 F (36.9 C)  SpO2: 98% 99%   Vitals:   06/25/21 0505 06/25/21 1408 06/25/21 2024 06/26/21 0435  BP: 134/74 127/72 (!) 142/76 (!) 148/86  Pulse: 68 73 77 84  Resp: '18 16 18 18  '$ Temp: 98.2 F (36.8 C) 98.3 F (36.8 C) 98 F (36.7 C) 98.4 F (36.9 C)  TempSrc: Oral Oral Oral Oral  SpO2: 100% 100% 98% 99%  Weight:      Height:        General: Pt is alert, awake, not in acute distress Cardiovascular: RRR, S1/S2 +, no rubs, no gallops Respiratory: CTA bilaterally, no wheezing, no rhonchi Abdominal: Soft, NT, ND, bowel sounds + Extremities: no edema, no cyanosis    The results of significant diagnostics from this hospitalization (including imaging, microbiology, ancillary and laboratory) are listed below for reference.     Microbiology: Recent Results (from the past 240 hour(s))  Resp Panel by RT-PCR (Flu A&B, Covid) Nasopharyngeal Swab     Status: None   Collection Time: 06/22/21  1:12 PM   Specimen: Nasopharyngeal Swab; Nasopharyngeal(NP) swabs in vial transport medium  Result Value Ref Range Status   SARS Coronavirus 2 by RT PCR NEGATIVE NEGATIVE Final    Comment: (NOTE) SARS-CoV-2 target nucleic acids are NOT DETECTED.  The SARS-CoV-2 RNA is generally detectable in upper respiratory specimens during the acute phase of infection. The lowest concentration of SARS-CoV-2 viral copies this assay can detect is 138 copies/mL. A negative result does not preclude SARS-Cov-2 infection and should not be used as the sole basis for treatment or other patient management decisions. A negative result may occur with  improper specimen collection/handling, submission of specimen other than nasopharyngeal swab, presence of viral mutation(s) within the areas targeted by this assay, and  inadequate number of viral copies(<138 copies/mL). A negative result must be combined with clinical observations, patient history, and epidemiological information. The expected result is Negative.  Fact Sheet for Patients:  EntrepreneurPulse.com.au  Fact Sheet for Healthcare Providers:  IncredibleEmployment.be  This test is no t yet approved or cleared by the [place] FDA and  has been authorized for detection and/or diagnosis of SARS-CoV-2 by FDA under an Emergency Use Authorization (EUA). This EUA will remain  in effect (meaning this test can be used) for the duration of the COVID-19 declaration under Section 564(b)(1) of the Act, 21 U.S.C.section 360bbb-3(b)(1), unless the authorization is terminated  or revoked sooner.       Influenza A by PCR NEGATIVE NEGATIVE Final   Influenza B by PCR NEGATIVE NEGATIVE Final    Comment: (NOTE) The Xpert Xpress SARS-CoV-2/FLU/RSV plus assay is intended as an aid in the diagnosis of influenza from Nasopharyngeal swab specimens and should not be used as a sole basis for treatment. Nasal washings and aspirates are unacceptable for Xpert Xpress SARS-CoV-2/FLU/RSV testing.  Fact Sheet for Patients: EntrepreneurPulse.com.au  Fact Sheet for Healthcare Providers: IncredibleEmployment.be  This test is not yet approved or cleared by the [place] FDA and has been authorized for detection and/or diagnosis of SARS-CoV-2 by FDA under an Emergency Use Authorization (EUA). This EUA will remain in effect (meaning this test can be used) for the duration of the COVID-19 declaration under Section 564(b)(1) of the Act, 21 U.S.C. section 360bbb-3(b)(1), unless the authorization is terminated or revoked.  Performed at [facility], [place] [address] [place], [place] [address]   Culture, blood (routine x 2)     Status: None (Preliminary result)   Collection  Time: 06/22/21  2:38 PM   Specimen: BLOOD  Result Value Ref Range Status   Specimen Description   Final    BLOOD SITE NOT SPECIFIED Performed at [place] [address] [place], [place] [address]    Special Requests   Final    BOTTLES DRAWN AEROBIC AND ANAEROBIC Blood Culture results may not be optimal due to an inadequate volume of blood received in culture bottles Performed at [place] [address] [place], [place] [address]    Culture   Final    NO GROWTH 4 DAYS Performed at [facility], [place] [address] [place], [place] [address]    Report Status PENDING  Incomplete  Culture, blood (routine x 2)     Status: None (Preliminary result)   Collection Time: 06/22/21  2:38 PM   Specimen: BLOOD  Result Value Ref Range Status   Specimen Description   Final    BLOOD SITE NOT SPECIFIED Performed at [place]  [address] [place], [place] [address]    Special Requests   Final    BOTTLES DRAWN AEROBIC AND ANAEROBIC Blood Culture adequate volume Performed at [place] [address] [place], [place] [address]    Culture   Final    NO GROWTH 4 DAYS Performed at [facility], [place] [address], [place], [place] [address]    Report Status PENDING  Incomplete     Labs: BNP (last 3 results) No results for input(s): BNP in the last 8760 hours. Basic Metabolic Panel: Recent Labs  Lab 06/22/21 1312 06/23/21 0435 06/24/21 0433 06/25/21 0541 06/26/21 0444  NA 127* 129* 129* 131* 133*  K 3.1* 3.4* 4.2 3.8 3.4*  CL 90* 95* 98 100 101  CO2 25 24 19* 20* 21*  GLUCOSE 85 67* 122* 101* 94  BUN 27* 25* 28* 24* 21  CREATININE 0.57* 0.44* 0.66 0.60* 0.52*  CALCIUM 9.5 9.2 9.1 9.0 8.8*  MG 2.8*  --   --   --   --    Liver Function Tests: Recent Labs  Lab 06/22/21 1312 06/23/21 0435 06/24/21 0433 06/25/21 0541 06/26/21 0444  AST 420* 325* 236* 181* 142*  ALT 436* 340* 279*  231* 195*  ALKPHOS 2,274* 1,911* 966* 1,435* 1,253*  BILITOT 28.1* 23.5* 20.4* 13.9* 12.8*  PROT 7.8 6.7 6.4* 6.4* 6.0*  ALBUMIN 2.6* 2.2* 2.0* 2.1* 2.0*   Recent Labs  Lab 06/22/21 1312  LIPASE 40   Recent Labs  Lab 06/22/21 1312  AMMONIA 65*   CBC: Recent Labs  Lab 06/22/21 1312 06/23/21 0435 06/24/21 0937 06/25/21 0541 06/26/21 0444  WBC 14.3* 13.0* 15.7* 17.4* 13.2*  NEUTROABS 12.6*  --   --  15.0*  --   HGB 11.3* 10.1* 9.9* 10.2* 9.0*  HCT 33.5* 29.7* 29.4* 31.2* 27.2*  MCV 87.9 88.4 91.0 92.3 91.3  PLT 562* 439* 457* 447* 414*   Cardiac Enzymes: No results for input(s): CKTOTAL, CKMB, CKMBINDEX, TROPONINI in the last 168 hours. BNP: Invalid input(s): POCBNP CBG: No results for input(s): GLUCAP in the last 168 hours. D-Dimer No results for input(s): DDIMER in the last 72 hours. Hgb A1c No results for input(s): HGBA1C in the last 72 hours. Lipid Profile No results for input(s): CHOL, HDL, LDLCALC, TRIG, CHOLHDL, LDLDIRECT in the last 72 hours. Thyroid function studies No results for input(s): TSH, T4TOTAL, T3FREE, THYROIDAB in the last 72 hours.  Invalid input(s): FREET3 Anemia work up No results for input(s): VITAMINB12, FOLATE, FERRITIN, TIBC, IRON, RETICCTPCT in the last 72 hours. Urinalysis    Component Value Date/Time   COLORURINE AMBER (A) 06/22/2021 2100   APPEARANCEUR HAZY (A) 06/22/2021 2100   LABSPEC 1.023 06/22/2021 2100   PHURINE 6.0 06/22/2021 2100   GLUCOSEU NEGATIVE 06/22/2021 2100   HGBUR SMALL (A) 06/22/2021 2100   HGBUR negative 03/13/2010 1145   BILIRUBINUR MODERATE (A) 06/22/2021 2100   BILIRUBINUR 1+ 11/13/2019 1341   KETONESUR 5 (A) 06/22/2021 2100   PROTEINUR 30 (A) 06/22/2021 2100   UROBILINOGEN 0.2 11/13/2019 1341   UROBILINOGEN 0.2 03/13/2010 1145   NITRITE NEGATIVE 06/22/2021 2100   LEUKOCYTESUR NEGATIVE 06/22/2021 2100   Sepsis Labs Invalid input(s): PROCALCITONIN,  WBC,  LACTICIDVEN Microbiology Recent Results  (from the past 240 hour(s))  Resp Panel by RT-PCR (Flu A&B, Covid) Nasopharyngeal Swab     Status: None   Collection Time: 06/22/21  1:12 PM   Specimen: Nasopharyngeal Swab; Nasopharyngeal(NP) swabs in vial transport medium  Result Value  Ref Range Status   SARS Coronavirus 2 by RT PCR NEGATIVE NEGATIVE Final    Comment: (NOTE) SARS-CoV-2 target nucleic acids are NOT DETECTED.  The SARS-CoV-2 RNA is generally detectable in upper respiratory specimens during the acute phase of infection. The lowest concentration of SARS-CoV-2 viral copies this assay can detect is 138 copies/mL. A negative result does not preclude SARS-Cov-2 infection and should not be used as the sole basis for treatment or other patient management decisions. A negative result may occur with  improper specimen collection/handling, submission of specimen other than nasopharyngeal swab, presence of viral mutation(s) within the areas targeted by this assay, and inadequate number of viral copies(<138 copies/mL). A negative result must be combined with clinical observations, patient history, and epidemiological information. The expected result is Negative.  Fact Sheet for Patients:  EntrepreneurPulse.com.au  Fact Sheet for Healthcare Providers:  IncredibleEmployment.be  This test is no t yet approved or cleared by the [place] FDA and  has been authorized for detection and/or diagnosis of SARS-CoV-2 by FDA under an Emergency Use Authorization (EUA). This EUA will remain  in effect (meaning this test can be used) for the duration of the COVID-19 declaration under Section 564(b)(1) of the Act, 21 U.S.C.section 360bbb-3(b)(1), unless the authorization is terminated  or revoked sooner.       Influenza A by PCR NEGATIVE NEGATIVE Final   Influenza B by PCR NEGATIVE NEGATIVE Final    Comment: (NOTE) The Xpert Xpress SARS-CoV-2/FLU/RSV plus assay is intended as an aid in the  diagnosis of influenza from Nasopharyngeal swab specimens and should not be used as a sole basis for treatment. Nasal washings and aspirates are unacceptable for Xpert Xpress SARS-CoV-2/FLU/RSV testing.  Fact Sheet for Patients: EntrepreneurPulse.com.au  Fact Sheet for Healthcare Providers: IncredibleEmployment.be  This test is not yet approved or cleared by the [place] FDA and has been authorized for detection and/or diagnosis of SARS-CoV-2 by FDA under an Emergency Use Authorization (EUA). This EUA will remain in effect (meaning this test can be used) for the duration of the COVID-19 declaration under Section 564(b)(1) of the Act, 21 U.S.C. section 360bbb-3(b)(1), unless the authorization is terminated or revoked.  Performed at [facility], [place] [address] [place], [place] [address]   Culture, blood (routine x 2)     Status: None (Preliminary result)   Collection Time: 06/22/21  2:38 PM   Specimen: BLOOD  Result Value Ref Range Status   Specimen Description   Final    BLOOD SITE NOT SPECIFIED Performed at [place] [address] [place], [place] [address]    Special Requests   Final    BOTTLES DRAWN AEROBIC AND ANAEROBIC Blood Culture results may not be optimal due to an inadequate volume of blood received in culture bottles Performed at [place] [address] [place], [place] [address]    Culture   Final    NO GROWTH 4 DAYS Performed at [facility], [place] [address], [place], [place] [address]    Report Status PENDING  Incomplete  Culture, blood (routine x 2)     Status: None (Preliminary result)   Collection Time: 06/22/21  2:38 PM   Specimen: BLOOD  Result Value Ref Range Status   Specimen Description   Final    BLOOD SITE NOT SPECIFIED Performed at [place] [address], [place],  [address]    Special Requests    Final  BOTTLES DRAWN AEROBIC AND ANAEROBIC Blood Culture adequate volume Performed at [place] [address], [place], [place] [address]    Culture   Final    NO GROWTH 4 DAYS Performed at [facility], [place] [address], [place], [place] [address]    Report Status PENDING  Incomplete     Time coordinating discharge: Over 30 minutes  SIGNED:   Shawna Clamp, MD  [facility] 06/26/2021, 12:28 PM Pager   If 7PM-7AM, please contact night-coverage [email or web address]

## 2021-06-26 NOTE — Clinical Social Work Note (Signed)
    Durable Medical Equipment  (From admission, onward)           Start     Ordered   06/26/21 1203  For home use only DME standard manual wheelchair with seat cushion  Once       Comments: Patient suffers from previous stroke with residual left sided weakness which impairs their ability to perform daily activities like toileting and dressing in the home.  A cane will not resolve issue with performing activities of daily living. A wheelchair will allow patient to safely perform daily activities. Patient can safely propel the wheelchair in the home or has a caregiver who can provide assistance. Length of need lifetime. Accessories: elevating leg rests (ELRs), wheel locks, extensions and anti-tippers.   06/26/21 1218   06/26/21 1144  For home use only DME Wheelchair electric  Once        06/26/21 1144   06/26/21 1127  For home use only DME Hospital bed  Once       Question Answer Comment  Length of Need Lifetime   Patient has (list medical condition): Generalsed weakness   The above medical condition requires: Patient requires the ability to reposition frequently   Bed type Semi-electric   Support Surface: Low Air loss Mattress      06/26/21 1127

## 2021-06-26 NOTE — Care Management Important Message (Signed)
Important Message  Patient Details IM Letter given to the Patient. Name: GREEN SCHERR MRN: [id number] Date of Birth: 12/31/1950   Medicare Important Message Given:  Yes     Kerin Salen 06/26/2021, 11:55 AM

## 2021-06-26 NOTE — Discharge Instructions (Signed)
Advised to follow-up with primary care physician in 1 week. Advised to follow-up with GI Dr. Rush Landmark in 2 weeks.   Advised to take ciprofloxacin 500 mg twice daily for 4 more days.

## 2021-06-26 NOTE — TOC Transition Note (Signed)
Transition of Care [facility]) - CM/SW Discharge Note   Patient Details  Name: Ruben Chavez MRN: [id number] Date of Birth: [birth year]  Transition of Care [facility]) CM/SW Contact:  Trish Mage, LCSW Phone Number: 06/26/2021, 11:44 AM   Clinical Narrative:   Patient who is stable for discharge today will return home, wife will transport.  [place] for delivery of bed, wheelchair.  Bed to be delivered home, wheelchair to room.  Orders seen and appreciated. Mr Mcphie states he was seen at [facility] for outpatient PT several months ago, and would prefer to return there.  Referral sent. No further needs identified.  TOC sign off.    Final next level of care: Home/Self Care Barriers to Discharge: No Barriers Identified   Patient Goals and CMS Choice        Discharge Placement                       Discharge Plan and Services                                     Social Determinants of Health (SDOH) Interventions     Readmission Risk Interventions No flowsheet data found.

## 2021-06-27 LAB — CULTURE, BLOOD (ROUTINE X 2)
Culture: NO GROWTH
Culture: NO GROWTH
Special Requests: ADEQUATE

## 2021-06-29 ENCOUNTER — Telehealth (HOSPITAL_COMMUNITY): Payer: Self-pay

## 2021-06-29 ENCOUNTER — Ambulatory Visit (INDEPENDENT_AMBULATORY_CARE_PROVIDER_SITE_OTHER): Payer: PPO

## 2021-06-29 ENCOUNTER — Telehealth: Payer: Self-pay

## 2021-06-29 ENCOUNTER — Telehealth: Payer: Self-pay | Admitting: Gastroenterology

## 2021-06-29 DIAGNOSIS — I63411 Cerebral infarction due to embolism of right middle cerebral artery: Secondary | ICD-10-CM

## 2021-06-29 NOTE — Telephone Encounter (Signed)
Noted thank you

## 2021-06-29 NOTE — Telephone Encounter (Signed)
Noted  

## 2021-06-29 NOTE — Telephone Encounter (Signed)
Patient wife calling to schedule 2 week follow up with Gi Provider (Dr. Rush Landmark)). Scheduling can provde 09/22. Wife states patient needs to been prior t 09/22 due to scheduled appt with [facility] 07/28/21 for consultation.  Plz advise  Thank you

## 2021-06-29 NOTE — Telephone Encounter (Signed)
-----   Message from Marlon Pel, RN sent at 06/26/2021  3:52 PM EDT ----- Regarding: RE: Referral to [facility] Mr. Marigene Ehlers has been scheduled with [facility] Dr. Crisoforo Oxford for 07/28/21 1:30.  They will review his records for an earlier appointment consideration.  His wife is aware of the appointment.  ----- Message ----- From: Royston Bake, RN Sent: 06/26/2021  11:13 AM EDT To: Timothy Lasso, RN, Orson Slick, MD Subject: Referral to [facility]                                 Darrik Richman,  Good morning.  I saw your note about trying to get this patient in at [facility].  Have you been able to speak with anyone?  Tollie Pizza

## 2021-06-29 NOTE — Telephone Encounter (Signed)
Called to schedule us carotid, no answer, left vm. AW  

## 2021-06-29 NOTE — Telephone Encounter (Signed)
Pt's wife Curt Bears returned your call and stated that pt got an appt at [facility] this coming Friday so pt is all set. Pls disregard other request.

## 2021-06-29 NOTE — Telephone Encounter (Signed)
Per note pt does not need a 2 week f/u in the office per se.  He will need labs next week and [facility] appt.   Left message on machine to call back    My outpatient nurse will be working on coordinating a [facility] referral to surgical oncology (pancreaticobiliary).  Patient to continue antibiotics for a total of a 7-day course.  Hopefully will continue to see improvement in his liver tests.  We will plan to repeat LFTs in 1 week (will be placed under his primary GI name as I will be away next week) just to ensure that LFTs are improving.  Hopefully he will be a candidate for surgery and hopefully the stent will be enough.  If patient has continued jaundice after 2 weeks of the stent placement then he may require a percutaneous biliary drain.  Time will tell.  Patient appreciative for the help he is received.

## 2021-06-30 ENCOUNTER — Telehealth: Payer: Self-pay | Admitting: Internal Medicine

## 2021-06-30 DIAGNOSIS — R339 Retention of urine, unspecified: Secondary | ICD-10-CM

## 2021-06-30 NOTE — Telephone Encounter (Signed)
Patients wife is needing a Rx sent to [place] on [address] for a condom catheter with tubing and bag. He needs at least 5 until he goes to [facility] on Friday.  [phone]  Fax: [phone]  Please advise

## 2021-06-30 NOTE — Telephone Encounter (Signed)
*  Constipation:  Last good BM was a week ago, has not been eating or drinking much.  We discussed that drinking fluids can help with constipation.  I recommended that he use 2 fleets enemas this evening, about an hour apart and drink a bottle of magnesium citrate to get things moving from both the top and the bottom.  Then he will begin drinking MiraLAX 1 capful twice daily mixed in 8 ounces of liquid.

## 2021-06-30 NOTE — Telephone Encounter (Signed)
Order faxed and confirmed.

## 2021-06-30 NOTE — Telephone Encounter (Signed)
Left message on machine to call back see note from last office visit with Alonza Bogus below.

## 2021-06-30 NOTE — Addendum Note (Signed)
Addended by: Westley Hummer B on: 06/30/2021 03:55 PM   Modules accepted: Orders

## 2021-06-30 NOTE — Telephone Encounter (Signed)
Patient's wife called states the patients constipation not getting any better even with the medication given please advise.

## 2021-07-01 ENCOUNTER — Telehealth (HOSPITAL_COMMUNITY): Payer: Self-pay

## 2021-07-01 ENCOUNTER — Other Ambulatory Visit: Payer: Self-pay

## 2021-07-01 ENCOUNTER — Other Ambulatory Visit (INDEPENDENT_AMBULATORY_CARE_PROVIDER_SITE_OTHER): Payer: PPO

## 2021-07-01 DIAGNOSIS — C240 Malignant neoplasm of extrahepatic bile duct: Secondary | ICD-10-CM | POA: Diagnosis not present

## 2021-07-01 LAB — COMPREHENSIVE METABOLIC PANEL
ALT: 195 U/L — ABNORMAL HIGH (ref 0–53)
AST: 193 U/L — ABNORMAL HIGH (ref 0–37)
Albumin: 2.9 g/dL — ABNORMAL LOW (ref 3.5–5.2)
Alkaline Phosphatase: 1677 U/L — ABNORMAL HIGH (ref 39–117)
BUN: 10 mg/dL (ref 6–23)
CO2: 25 mEq/L (ref 19–32)
Calcium: 8.6 mg/dL (ref 8.4–10.5)
Chloride: 93 mEq/L — ABNORMAL LOW (ref 96–112)
Creatinine, Ser: 0.66 mg/dL (ref 0.40–1.50)
GFR: 94.39 mL/min (ref >60.00)
Glucose, Bld: 108 mg/dL — ABNORMAL HIGH (ref 70–99)
Potassium: 3.4 mEq/L — ABNORMAL LOW (ref 3.5–5.1)
Sodium: 125 mEq/L — ABNORMAL LOW (ref 135–145)
Total Bilirubin: 15.7 mg/dL — ABNORMAL HIGH (ref 0.2–1.2)
Total Protein: 6.3 g/dL (ref 6.0–8.3)

## 2021-07-01 NOTE — Telephone Encounter (Signed)
Called pt to schedule US carotid f/u. Pt has just been referred to [facility] for workup regarding his liver. He would like to hold off for now since he will be going back and forth to [facility]. He will call back to schedule. AW

## 2021-07-01 NOTE — Telephone Encounter (Signed)
Unable to reach the pt by phone will await further communication from the pt.   

## 2021-07-01 NOTE — Telephone Encounter (Signed)
Left message on machine to call back  

## 2021-07-02 ENCOUNTER — Encounter: Payer: Self-pay | Admitting: Internal Medicine

## 2021-07-02 ENCOUNTER — Telehealth: Payer: Self-pay | Admitting: Internal Medicine

## 2021-07-02 ENCOUNTER — Ambulatory Visit (INDEPENDENT_AMBULATORY_CARE_PROVIDER_SITE_OTHER): Payer: PPO | Admitting: Internal Medicine

## 2021-07-02 VITALS — BP 110/64 | HR 71 | Temp 98.5°F | Wt 148.5 lb

## 2021-07-02 DIAGNOSIS — Z09 Encounter for follow-up examination after completed treatment for conditions other than malignant neoplasm: Secondary | ICD-10-CM

## 2021-07-02 DIAGNOSIS — C240 Malignant neoplasm of extrahepatic bile duct: Secondary | ICD-10-CM | POA: Diagnosis not present

## 2021-07-02 DIAGNOSIS — K5900 Constipation, unspecified: Secondary | ICD-10-CM | POA: Diagnosis not present

## 2021-07-02 DIAGNOSIS — C801 Malignant (primary) neoplasm, unspecified: Secondary | ICD-10-CM

## 2021-07-02 DIAGNOSIS — K831 Obstruction of bile duct: Secondary | ICD-10-CM | POA: Diagnosis not present

## 2021-07-02 DIAGNOSIS — R17 Unspecified jaundice: Secondary | ICD-10-CM | POA: Diagnosis not present

## 2021-07-02 DIAGNOSIS — R634 Abnormal weight loss: Secondary | ICD-10-CM | POA: Diagnosis not present

## 2021-07-02 DIAGNOSIS — E440 Moderate protein-calorie malnutrition: Secondary | ICD-10-CM

## 2021-07-02 MED ORDER — MAGNESIUM CITRATE PO SOLN: 1.0000 | Freq: Once | ORAL | 1 refills | Status: AC

## 2021-07-02 NOTE — Progress Notes (Signed)
Established Patient Office Visit     This visit occurred during the SARS-CoV-2 public health emergency.  Safety protocols were in place, including screening questions prior to the visit, additional usage of staff PPE, and extensive cleaning of exam room while observing appropriate contact time as indicated for disinfecting solutions.    CC/Reason for Visit: Hospital follow-up  HPI: Ruben Chavez is a 70 y.o. male who is coming in today for the above mentioned reasons.  He was hospitalized from 06/22/2021-06/26/2021 due to obstructive jaundice and from what was believed to be cholangiocarcinoma.  In the hospital he was provided with IV fluids, antibiotics, he had an ERCP with palliative stent placement.  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  He has an appointment tomorrow with surgical oncology at [facility].  He had labs yesterday that showed slight improvement in his hyperbilirubinemia.  He is here with his wife today.  They state that his appetite has improved.  He is still very weak.  He has been very constipated, no bowel movement since hospital discharge.  Past Medical/Surgical History: Past Medical History:  Diagnosis Date   Elevated PSA    Hyperlipidemia    pt says not anymore   Hypertension    Prostate cancer ([place]) 2013    Past Surgical History:  Procedure Laterality Date   bilateral inguinal hernia  2008   BILIARY BRUSHING  06/23/2021   Procedure: BILIARY BRUSHING;  Surgeon: Irving Copas., MD;  Location: [facility];  Service: Gastroenterology;;   BILIARY DILATION  06/23/2021   Procedure: BILIARY DILATION;  Surgeon: Irving Copas., MD;  Location: [facility];  Service: Gastroenterology;;   BILIARY STENT PLACEMENT N/A 06/23/2021   Procedure: BILIARY STENT PLACEMENT;  Surgeon: Irving Copas., MD;  Location: [facility];  Service: Gastroenterology;  Laterality:  N/A;   BIOPSY  06/23/2021   Procedure: BIOPSY;  Surgeon: Rush Landmark Telford Nab., MD;  Location: [facility];  Service: Gastroenterology;;   Kathleen Argue STUDY  01/28/2020   Procedure: BUBBLE STUDY;  Surgeon: [name], MD;  Location: [place];  Service: Cardiovascular;;   CYST REMOVAL TRUNK  2016   sebaceous cyst   CYSTOSCOPY  04/22/2016   Procedure: CYSTOSCOPY;  Surgeon: [name], MD;  Location: [facility];  Service: Urology;;   ERCP N/A 06/23/2021   Procedure: ENDOSCOPIC RETROGRADE CHOLANGIOPANCREATOGRAPHY (ERCP);  Surgeon: Irving Copas., MD;  Location: [facility];  Service: Gastroenterology;  Laterality: N/A;   IR ANGIO INTRA EXTRACRAN SEL COM CAROTID INNOMINATE BILAT MOD SED  05/30/2020   IR ANGIO VERTEBRAL SEL SUBCLAVIAN INNOMINATE UNI R MOD SED  01/25/2020   IR ANGIO VERTEBRAL SEL VERTEBRAL BILAT MOD SED  05/30/2020   IR CT HEAD LTD  01/25/2020   IR PERCUTANEOUS ART THROMBECTOMY/INFUSION INTRACRANIAL INC DIAG ANGIO  01/25/2020   IR US GUIDE VASC ACCESS RIGHT  05/30/2020   KNEE ARTHROSCOPY  2007   right   LOOP RECORDER INSERTION N/A 01/28/2020   Procedure: LOOP RECORDER INSERTION;  Surgeon: [name], MD;  Location: [facility];  Service: Cardiovascular;  Laterality: N/A;   PANCREATIC STENT PLACEMENT  06/23/2021   Procedure: PANCREATIC STENT PLACEMENT;  Surgeon: Irving Copas., MD;  Location: [facility];  Service: Gastroenterology;;   PROSTATE BIOPSY  2008   PROSTATE BIOPSY  2013   PROSTATE BIOPSY  01/28/2016   RADIOACTIVE SEED IMPLANT N/A 04/22/2016   Procedure: RADIOACTIVE SEED IMPLANT/BRACHYTHERAPY IMPLANT;  Surgeon: [name], MD;  Location: [facility];  Service: Urology;  Laterality: N/A;   RADIOLOGY WITH ANESTHESIA N/A 01/24/2020   Procedure: IR WITH ANESTHESIA;  Surgeon: [name], MD;  Location: [place];  Service: Radiology;  Laterality: N/A;   REMOVAL OF STONES  06/23/2021   Procedure:  REMOVAL OF STONES;  Surgeon: Rush Landmark Telford Nab., MD;  Location: [facility];  Service: Gastroenterology;;   Joan Mayans  06/23/2021   Procedure: Joan Mayans;  Surgeon: Rush Landmark Telford Nab., MD;  Location: [facility];  Service: Gastroenterology;;   TEE WITHOUT CARDIOVERSION N/A 01/28/2020   Procedure: TRANSESOPHAGEAL ECHOCARDIOGRAM (TEE);  Surgeon: [name], MD;  Location: [facility];  Service: Cardiovascular;  Laterality: N/A;   TONSILLECTOMY     TOTAL HIP ARTHROPLASTY  2001   left   TOTAL HIP ARTHROPLASTY Right 01/18/2018   Procedure: RIGHT TOTAL HIP ARTHROPLASTY ANTERIOR APPROACH;  Surgeon: [name], MD;  Location: [facility];  Service: Orthopedics;  Laterality: Right;    Social History:  reports that he quit smoking about 22 years ago. His smoking use included cigarettes. He has a 32.00 pack-year smoking history. He has never used smokeless tobacco. He reports previous alcohol use of about 14.0 standard drinks of alcohol per week. He reports that he does not use drugs.  Allergies: Allergies  Allergen Reactions   Sulfa Antibiotics Swelling and Other (See Comments)    Swelling in ankles    Advil [Ibuprofen] Other (See Comments)    "Dots on chest" (Petechiae)   Aleve [Naproxen] Other (See Comments)    "Dots on chest" (Petechiae)   Betadine [Povidone Iodine] Itching and Rash   Chloroxylenol (Antiseptic) Itching, Rash and Other (See Comments)    PCMX surgical sterilizing scrub   Poison Ivy Extract Rash   Povidone-Iodine Itching and Rash    BETADINE    Family History:  Family History  Problem Relation Age of Onset   Pancreatic cancer Mother        small bowel cancer   Heart disease Father    Colon cancer Neg Hx    Stomach cancer Neg Hx      Current Outpatient Medications:    acetaminophen (TYLENOL) 500 MG tablet, Take 500 mg by mouth daily as needed (pain)., Disp: , Rfl:    aspirin EC 81 MG EC tablet, Take 1 tablet (81 mg total) by mouth daily.  Swallow whole., Disp: 30 tablet, Rfl: 1   buPROPion (WELLBUTRIN XL) 300 MG 24 hr tablet, TAKE 1 TABLET(300 MG) BY MOUTH DAILY, Disp: 90 tablet, Rfl: 1   folic acid (FOLVITE) 1 MG tablet, TAKE 1 TABLET(1 MG) BY MOUTH DAILY, Disp: 90 tablet, Rfl: 1   lisinopril-hydrochlorothiazide (ZESTORETIC) 20-25 MG tablet, TAKE 1 TABLET BY MOUTH DAILY, Disp: 90 tablet, Rfl: 1   Magnesium 250 MG TABS, Take 250 mg by mouth every morning., Disp: , Rfl:    Multiple Vitamin (MULTIVITAMIN WITH MINERALS) TABS tablet, Take 1 tablet by mouth daily., Disp:  , Rfl:    pantoprazole (PROTONIX) 40 MG tablet, Take 1 tablet (40 mg total) by mouth daily., Disp: 90 tablet, Rfl: 1   polyethylene glycol (MIRALAX / GLYCOLAX) 17 g packet, Take 17 g by mouth daily as needed (constipation)., Disp: , Rfl:    traZODone (DESYREL) 100 MG tablet, TAKE 1 TABLET(100 MG) BY MOUTH AT BEDTIME AS NEEDED FOR SLEEP, Disp: 90 tablet, Rfl: 1  Review of Systems:  Constitutional: Denies fever, chills, diaphoresis. HEENT: Denies photophobia, eye pain, redness, hearing loss, ear  pain, congestion, sore throat, rhinorrhea, sneezing, mouth sores, trouble swallowing, neck pain, neck stiffness and tinnitus.   Respiratory: Denies SOB, DOE, cough, chest tightness,  and wheezing.   Cardiovascular: Denies chest pain, palpitations and leg swelling.  Gastrointestinal: Denies nausea, vomiting, abdominal pain, diarrhea,  blood in stool and abdominal distention.  Genitourinary: Denies dysuria, urgency, frequency, hematuria, flank pain and difficulty urinating.  Endocrine: Denies: hot or cold intolerance, sweats, changes in hair or nails, polyuria, polydipsia. Musculoskeletal: Denies myalgias, back pain, joint swelling, arthralgias and gait problem.  Skin: Denies pallor, rash and wound.  Neurological: Denies dizziness, seizures, syncope,  light-headedness, numbness and headaches.  Hematological: Denies adenopathy. Easy bruising, personal or family bleeding history   Psychiatric/Behavioral: Denies suicidal ideation, mood changes, confusion, nervousness, sleep disturbance and agitation    Physical Exam: Vitals:   07/02/21 1308  BP: 110/64  Pulse: 71  Temp: 98.5 F (36.9 C)  TempSrc: Oral  SpO2: 97%  Weight: 148 lb 8 oz (67.4 kg)    Body mass index is 20.71 kg/m.   Constitutional: NAD, calm, comfortable very icteric Eyes: PERRL, scleral jaundice present ENMT: Mucous membranes are moist.  Psychiatric: Normal judgment and insight. Alert and oriented x 3. Normal mood.    Impression and Plan:  Hospital discharge follow-up [place]'s tumor ([place]) Jaundice Obstructive jaundice due to malignant neoplasm (HCC) Weight loss, unintentional Malnutrition of moderate degree Constipation, unspecified constipation type  [facility] charts have been reviewed in detail, medication reconciliation has been performed. -Labs requested by discharging physician were drawn yesterday by GI. -He has completed his course of antibiotics. -He has his initial visit with surgical oncology at [facility] tomorrow. -For his constipation I will give him some mag citrate.  They have tried Colace, MiraLAX without relief.  Time spent: 34 minutes reviewing chart, interviewing patient and wife, examining patient and formulating plan of care.    Lelon Frohlich, MD [facility]

## 2021-07-02 NOTE — Telephone Encounter (Signed)
[employer] called to let Dr. Jerilee Hoh know that they are not in network with the insurance.

## 2021-07-03 ENCOUNTER — Other Ambulatory Visit: Payer: Self-pay | Admitting: Internal Medicine

## 2021-07-03 DIAGNOSIS — C221 Intrahepatic bile duct carcinoma: Secondary | ICD-10-CM

## 2021-07-03 DIAGNOSIS — R17 Unspecified jaundice: Secondary | ICD-10-CM | POA: Diagnosis not present

## 2021-07-03 DIAGNOSIS — R29898 Other symptoms and signs involving the musculoskeletal system: Secondary | ICD-10-CM

## 2021-07-07 DIAGNOSIS — K831 Obstruction of bile duct: Secondary | ICD-10-CM | POA: Diagnosis not present

## 2021-07-07 DIAGNOSIS — C221 Intrahepatic bile duct carcinoma: Secondary | ICD-10-CM | POA: Diagnosis not present

## 2021-07-07 DIAGNOSIS — C249 Malignant neoplasm of biliary tract, unspecified: Secondary | ICD-10-CM | POA: Diagnosis not present

## 2021-07-09 ENCOUNTER — Other Ambulatory Visit: Payer: Self-pay

## 2021-07-09 ENCOUNTER — Telehealth: Payer: Self-pay | Admitting: Hematology and Oncology

## 2021-07-09 NOTE — Telephone Encounter (Signed)
Called to schedule lab and f/u per provider request ( 8/4 sch msg), however, after speaking to patients wife, she will call back to schedule after the patients appt at [facility].

## 2021-07-09 NOTE — Progress Notes (Signed)
Opened in error

## 2021-07-10 ENCOUNTER — Emergency Department (HOSPITAL_COMMUNITY): Payer: PPO

## 2021-07-10 ENCOUNTER — Inpatient Hospital Stay (HOSPITAL_COMMUNITY)
Admission: EM | Admit: 2021-07-10 | Discharge: 2021-07-13 | DRG: 871 | Disposition: A | Discharge status: Home / Self Care | Payer: PPO | Attending: Internal Medicine | Admitting: Internal Medicine

## 2021-07-10 ENCOUNTER — Other Ambulatory Visit: Payer: Self-pay

## 2021-07-10 ENCOUNTER — Encounter (HOSPITAL_COMMUNITY): Payer: Self-pay

## 2021-07-10 DIAGNOSIS — K831 Obstruction of bile duct: Secondary | ICD-10-CM | POA: Diagnosis not present

## 2021-07-10 DIAGNOSIS — Z20822 Contact with and (suspected) exposure to covid-19: Secondary | ICD-10-CM | POA: Diagnosis present

## 2021-07-10 DIAGNOSIS — M47816 Spondylosis without myelopathy or radiculopathy, lumbar region: Secondary | ICD-10-CM | POA: Diagnosis not present

## 2021-07-10 DIAGNOSIS — C240 Malignant neoplasm of extrahepatic bile duct: Secondary | ICD-10-CM

## 2021-07-10 DIAGNOSIS — E785 Hyperlipidemia, unspecified: Secondary | ICD-10-CM | POA: Diagnosis present

## 2021-07-10 DIAGNOSIS — Z872 Personal history of diseases of the skin and subcutaneous tissue: Secondary | ICD-10-CM | POA: Diagnosis not present

## 2021-07-10 DIAGNOSIS — Z87891 Personal history of nicotine dependence: Secondary | ICD-10-CM | POA: Diagnosis not present

## 2021-07-10 DIAGNOSIS — K8309 Other cholangitis: Secondary | ICD-10-CM | POA: Diagnosis not present

## 2021-07-10 DIAGNOSIS — A419 Sepsis, unspecified organism: Principal | ICD-10-CM | POA: Diagnosis present

## 2021-07-10 DIAGNOSIS — Z800 Family history of malignant neoplasm of digestive organs: Secondary | ICD-10-CM

## 2021-07-10 DIAGNOSIS — Z8249 Family history of ischemic heart disease and other diseases of the circulatory system: Secondary | ICD-10-CM | POA: Diagnosis not present

## 2021-07-10 DIAGNOSIS — Z883 Allergy status to other anti-infective agents status: Secondary | ICD-10-CM | POA: Diagnosis not present

## 2021-07-10 DIAGNOSIS — Z0389 Encounter for observation for other suspected diseases and conditions ruled out: Secondary | ICD-10-CM | POA: Diagnosis not present

## 2021-07-10 DIAGNOSIS — E876 Hypokalemia: Secondary | ICD-10-CM | POA: Diagnosis not present

## 2021-07-10 DIAGNOSIS — Z7982 Long term (current) use of aspirin: Secondary | ICD-10-CM

## 2021-07-10 DIAGNOSIS — Z96643 Presence of artificial hip joint, bilateral: Secondary | ICD-10-CM | POA: Diagnosis present

## 2021-07-10 DIAGNOSIS — F32A Depression, unspecified: Secondary | ICD-10-CM | POA: Diagnosis not present

## 2021-07-10 DIAGNOSIS — C221 Intrahepatic bile duct carcinoma: Secondary | ICD-10-CM | POA: Diagnosis not present

## 2021-07-10 DIAGNOSIS — R17 Unspecified jaundice: Secondary | ICD-10-CM | POA: Diagnosis not present

## 2021-07-10 DIAGNOSIS — K838 Other specified diseases of biliary tract: Secondary | ICD-10-CM | POA: Diagnosis not present

## 2021-07-10 DIAGNOSIS — R509 Fever, unspecified: Secondary | ICD-10-CM

## 2021-07-10 DIAGNOSIS — Z882 Allergy status to sulfonamides status: Secondary | ICD-10-CM

## 2021-07-10 DIAGNOSIS — R7989 Other specified abnormal findings of blood chemistry: Secondary | ICD-10-CM | POA: Diagnosis not present

## 2021-07-10 DIAGNOSIS — G9341 Metabolic encephalopathy: Secondary | ICD-10-CM | POA: Diagnosis not present

## 2021-07-10 DIAGNOSIS — Z886 Allergy status to analgesic agent status: Secondary | ICD-10-CM

## 2021-07-10 DIAGNOSIS — Z9109 Other allergy status, other than to drugs and biological substances: Secondary | ICD-10-CM

## 2021-07-10 DIAGNOSIS — Z79899 Other long term (current) drug therapy: Secondary | ICD-10-CM | POA: Diagnosis not present

## 2021-07-10 DIAGNOSIS — I69351 Hemiplegia and hemiparesis following cerebral infarction affecting right dominant side: Secondary | ICD-10-CM | POA: Diagnosis not present

## 2021-07-10 DIAGNOSIS — Z8546 Personal history of malignant neoplasm of prostate: Secondary | ICD-10-CM | POA: Diagnosis not present

## 2021-07-10 DIAGNOSIS — I959 Hypotension, unspecified: Secondary | ICD-10-CM | POA: Diagnosis not present

## 2021-07-10 DIAGNOSIS — R6510 Systemic inflammatory response syndrome (SIRS) of non-infectious origin without acute organ dysfunction: Secondary | ICD-10-CM | POA: Diagnosis present

## 2021-07-10 DIAGNOSIS — Z888 Allergy status to other drugs, medicaments and biological substances status: Secondary | ICD-10-CM

## 2021-07-10 DIAGNOSIS — C801 Malignant (primary) neoplasm, unspecified: Secondary | ICD-10-CM | POA: Diagnosis not present

## 2021-07-10 DIAGNOSIS — E871 Hypo-osmolality and hyponatremia: Secondary | ICD-10-CM | POA: Diagnosis present

## 2021-07-10 DIAGNOSIS — I10 Essential (primary) hypertension: Secondary | ICD-10-CM | POA: Diagnosis present

## 2021-07-10 DIAGNOSIS — N200 Calculus of kidney: Secondary | ICD-10-CM | POA: Diagnosis not present

## 2021-07-10 DIAGNOSIS — Z95810 Presence of automatic (implantable) cardiac defibrillator: Secondary | ICD-10-CM | POA: Diagnosis not present

## 2021-07-10 LAB — I-STAT CHEM 8, ED
BUN: 12 mg/dL (ref 8–23)
Calcium, Ion: 1.12 mmol/L — ABNORMAL LOW (ref 1.15–1.40)
Chloride: 99 mmol/L (ref 98–111)
Creatinine, Ser: 0.6 mg/dL — ABNORMAL LOW (ref 0.61–1.24)
Glucose, Bld: 95 mg/dL (ref 70–99)
HCT: 30 % — ABNORMAL LOW (ref 39.0–52.0)
Hemoglobin: 10.2 g/dL — ABNORMAL LOW (ref 13.0–17.0)
Potassium: 3.9 mmol/L (ref 3.5–5.1)
Sodium: 133 mmol/L — ABNORMAL LOW (ref 135–145)
TCO2: 22 mmol/L (ref 22–32)

## 2021-07-10 LAB — COMPREHENSIVE METABOLIC PANEL
ALT: 113 U/L — ABNORMAL HIGH (ref 0–44)
AST: 94 U/L — ABNORMAL HIGH (ref 15–41)
Albumin: 2.5 g/dL — ABNORMAL LOW (ref 3.5–5.0)
Alkaline Phosphatase: 845 U/L — ABNORMAL HIGH (ref 38–126)
Anion gap: 8 (ref 5–15)
BUN: 15 mg/dL (ref 8–23)
CO2: 24 mmol/L (ref 22–32)
Calcium: 8.9 mg/dL (ref 8.9–10.3)
Chloride: 99 mmol/L (ref 98–111)
Creatinine, Ser: 0.8 mg/dL (ref 0.61–1.24)
GFR, Estimated: >60 mL/min (ref >60)
Glucose, Bld: 95 mg/dL (ref 70–99)
Potassium: 3.8 mmol/L (ref 3.5–5.1)
Sodium: 131 mmol/L — ABNORMAL LOW (ref 135–145)
Total Bilirubin: 7.5 mg/dL — ABNORMAL HIGH (ref 0.3–1.2)
Total Protein: 6.6 g/dL (ref 6.5–8.1)

## 2021-07-10 LAB — PROTIME-INR
INR: 1 (ref 0.8–1.2)
INR: 1 (ref 0.8–1.2)
Prothrombin Time: 13 seconds (ref 11.4–15.2)
Prothrombin Time: 13.4 seconds (ref 11.4–15.2)

## 2021-07-10 LAB — URINALYSIS, ROUTINE W REFLEX MICROSCOPIC
Bilirubin Urine: NEGATIVE
Glucose, UA: NEGATIVE mg/dL
Hgb urine dipstick: NEGATIVE
Ketones, ur: NEGATIVE mg/dL
Leukocytes,Ua: NEGATIVE
Nitrite: NEGATIVE
Protein, ur: NEGATIVE mg/dL
Specific Gravity, Urine: >1.046 — ABNORMAL HIGH (ref 1.005–1.030)
pH: 7 (ref 5.0–8.0)

## 2021-07-10 LAB — APTT: aPTT: 34 seconds (ref 24–36)

## 2021-07-10 LAB — CBC WITH DIFFERENTIAL/PLATELET
Abs Immature Granulocytes: 0.04 10*3/uL (ref 0.00–0.07)
Basophils Absolute: 0 10*3/uL (ref 0.0–0.1)
Basophils Relative: 0 %
Eosinophils Absolute: 0 10*3/uL (ref 0.0–0.5)
Eosinophils Relative: 0 %
HCT: 29.2 % — ABNORMAL LOW (ref 39.0–52.0)
Hemoglobin: 9.5 g/dL — ABNORMAL LOW (ref 13.0–17.0)
Immature Granulocytes: 0 %
Lymphocytes Relative: 2 %
Lymphs Abs: 0.3 10*3/uL — ABNORMAL LOW (ref 0.7–4.0)
MCH: 30.9 pg (ref 26.0–34.0)
MCHC: 32.5 g/dL (ref 30.0–36.0)
MCV: 95.1 fL (ref 80.0–100.0)
Monocytes Absolute: 0.7 10*3/uL (ref 0.1–1.0)
Monocytes Relative: 6 %
Neutro Abs: 9.8 10*3/uL — ABNORMAL HIGH (ref 1.7–7.7)
Neutrophils Relative %: 92 %
Platelets: 395 10*3/uL (ref 150–400)
RBC: 3.07 MIL/uL — ABNORMAL LOW (ref 4.22–5.81)
RDW: 17.4 % — ABNORMAL HIGH (ref 11.5–15.5)
WBC: 10.8 10*3/uL — ABNORMAL HIGH (ref 4.0–10.5)
nRBC: 0 % (ref 0.0–0.2)

## 2021-07-10 LAB — RESP PANEL BY RT-PCR (FLU A&B, COVID) ARPGX2
Influenza A by PCR: NEGATIVE
Influenza B by PCR: NEGATIVE
SARS Coronavirus 2 by RT PCR: NEGATIVE

## 2021-07-10 LAB — LACTIC ACID, PLASMA
Lactic Acid, Venous: 1.3 mmol/L (ref 0.5–1.9)
Lactic Acid, Venous: 1.8 mmol/L (ref 0.5–1.9)

## 2021-07-10 LAB — AMMONIA: Ammonia: 50 umol/L — ABNORMAL HIGH (ref 9–35)

## 2021-07-10 LAB — LIPASE, BLOOD: Lipase: 27 U/L (ref 11–51)

## 2021-07-10 MED ORDER — ONDANSETRON HCL 4 MG PO TABS: 4.0000 mg | ORAL_TABLET | Freq: Four times a day (QID) | ORAL | Status: DC | PRN

## 2021-07-10 MED ORDER — ASPIRIN EC 325 MG PO TBEC
325.0000 mg | DELAYED_RELEASE_TABLET | Freq: Every day | ORAL | Status: DC
  Administered 2021-07-11 – 2021-07-13 (×3): 325 mg via ORAL
  Filled 2021-07-10 (×4): qty 1

## 2021-07-10 MED ORDER — VANCOMYCIN HCL 1250 MG/250ML IV SOLN
1250.0000 mg | Freq: Once | INTRAVENOUS | Status: AC
  Administered 2021-07-10: 1250 mg via INTRAVENOUS
  Filled 2021-07-10: qty 250

## 2021-07-10 MED ORDER — VANCOMYCIN HCL 1250 MG/250ML IV SOLN
1250.0000 mg | INTRAVENOUS | Status: DC
  Administered 2021-07-10 – 2021-07-12 (×2): 1250 mg via INTRAVENOUS
  Filled 2021-07-10 (×2): qty 250

## 2021-07-10 MED ORDER — METRONIDAZOLE 500 MG/100ML IV SOLN
500.0000 mg | Freq: Two times a day (BID) | INTRAVENOUS | Status: DC
  Administered 2021-07-10 – 2021-07-11 (×3): 500 mg via INTRAVENOUS
  Filled 2021-07-10 (×3): qty 100

## 2021-07-10 MED ORDER — ENOXAPARIN SODIUM 40 MG/0.4ML IJ SOSY
40.0000 mg | PREFILLED_SYRINGE | INTRAMUSCULAR | Status: DC
  Administered 2021-07-10 – 2021-07-12 (×3): 40 mg via SUBCUTANEOUS
  Filled 2021-07-10 (×3): qty 0.4

## 2021-07-10 MED ORDER — LACTATED RINGERS IV BOLUS (SEPSIS)
1000.0000 mL | Freq: Once | INTRAVENOUS | Status: AC
  Administered 2021-07-10: 1000 mL via INTRAVENOUS

## 2021-07-10 MED ORDER — ADULT MULTIVITAMIN W/MINERALS CH
1.0000 | ORAL_TABLET | Freq: Every day | ORAL | Status: DC
  Administered 2021-07-10 – 2021-07-13 (×4): 1 via ORAL
  Filled 2021-07-10 (×4): qty 1

## 2021-07-10 MED ORDER — TRAZODONE HCL 100 MG PO TABS
100.0000 mg | ORAL_TABLET | Freq: Every day | ORAL | Status: DC
  Administered 2021-07-10 – 2021-07-12 (×3): 100 mg via ORAL
  Filled 2021-07-10 (×3): qty 1

## 2021-07-10 MED ORDER — ONDANSETRON HCL 4 MG/2ML IJ SOLN: 4.0000 mg | Freq: Four times a day (QID) | INTRAMUSCULAR | Status: DC | PRN

## 2021-07-10 MED ORDER — IOHEXOL 350 MG/ML SOLN
80.0000 mL | Freq: Once | INTRAVENOUS | Status: AC | PRN
  Administered 2021-07-10: 80 mL via INTRAVENOUS

## 2021-07-10 MED ORDER — PANTOPRAZOLE SODIUM 40 MG PO TBEC
40.0000 mg | DELAYED_RELEASE_TABLET | Freq: Every day | ORAL | Status: DC
  Administered 2021-07-10 – 2021-07-13 (×4): 40 mg via ORAL
  Filled 2021-07-10 (×4): qty 1

## 2021-07-10 MED ORDER — SODIUM CHLORIDE 0.9 % IV SOLN
2.0000 g | Freq: Three times a day (TID) | INTRAVENOUS | Status: DC
  Administered 2021-07-10 – 2021-07-12 (×5): 2 g via INTRAVENOUS
  Filled 2021-07-10 (×6): qty 2

## 2021-07-10 MED ORDER — VANCOMYCIN HCL IN DEXTROSE 1-5 GM/200ML-% IV SOLN: 1000.0000 mg | Freq: Once | INTRAVENOUS | Status: DC

## 2021-07-10 MED ORDER — FOLIC ACID 1 MG PO TABS
1.0000 mg | ORAL_TABLET | Freq: Every day | ORAL | Status: DC
  Administered 2021-07-10 – 2021-07-13 (×4): 1 mg via ORAL
  Filled 2021-07-10 (×4): qty 1

## 2021-07-10 MED ORDER — SODIUM CHLORIDE 0.9 % IV SOLN: INTRAVENOUS | Status: DC

## 2021-07-10 MED ORDER — METRONIDAZOLE 500 MG/100ML IV SOLN
500.0000 mg | Freq: Once | INTRAVENOUS | Status: AC
  Administered 2021-07-10: 500 mg via INTRAVENOUS
  Filled 2021-07-10: qty 100

## 2021-07-10 MED ORDER — SODIUM CHLORIDE 0.9 % IV SOLN
2.0000 g | Freq: Once | INTRAVENOUS | Status: AC
  Administered 2021-07-10: 2 g via INTRAVENOUS
  Filled 2021-07-10: qty 2

## 2021-07-10 NOTE — H&P (Signed)
History and Physical    Ruben Chavez [id number] DOB: [birth year] DOA: 07/10/2021  PCP: Isaac Bliss, Rayford Halsted, MD  Patient coming from: home  Chief Complaint: chills  HPI: Ruben Chavez is a 70 y.o. male with medical history significant of bile duct adenocarcinoma, CVE w/ right side residuals, HLD, HTN. Presenting with fevers, confusion, weakness. History from his wife. He was seen in the hospital for biliary obstruction. A mass was noted at the bifurcation of the hepatic ducts. A left hepatic duct stent was placed. He had cytology positive for adenocarcinoma. He was sent for follow up with [facility] surgical oncology for further w/u. Per wife, on 07/07/21 there was some difficulty with further stent placement. He was found to have a complete obliteration of the right hepatic duct system. He was able to go home. His wife noted a fever that evening. The patient had fevers again for the next day. They spoke with the surgery team at [facility]. He was prescribed cipro. He has not improved on this medication. He has become more weak and confused. He has continued to have fevers. His wife became concerned this morning and called for EMS. She denies any other aggravating or alleviating factors.   ED Course: He was found to be hypotensive. His liver enzymes were elevated, but improved from previous lab draws. He was started on broad spec abx for concern of febrile illness w/o source.   Review of Systems:  Review of systems is otherwise negative for all not mentioned in HPI.   PMHx Past Medical History:  Diagnosis Date   Elevated PSA    Hyperlipidemia    pt says not anymore   Hypertension    Prostate cancer ([place]) 2013    PSHx Past Surgical History:  Procedure Laterality Date   bilateral inguinal hernia  2008   BILIARY BRUSHING  06/23/2021   Procedure: BILIARY BRUSHING;  Surgeon: Irving Copas., MD;  Location: [facility];  Service: Gastroenterology;;   BILIARY DILATION  06/23/2021    Procedure: BILIARY DILATION;  Surgeon: Irving Copas., MD;  Location: [facility];  Service: Gastroenterology;;   BILIARY STENT PLACEMENT N/A 06/23/2021   Procedure: BILIARY STENT PLACEMENT;  Surgeon: Irving Copas., MD;  Location: [facility];  Service: Gastroenterology;  Laterality: N/A;   BIOPSY  06/23/2021   Procedure: BIOPSY;  Surgeon: Rush Landmark Telford Nab., MD;  Location: [facility];  Service: Gastroenterology;;   Kathleen Argue STUDY  01/28/2020   Procedure: BUBBLE STUDY;  Surgeon: [name], MD;  Location: [facility];  Service: Cardiovascular;;   CYST REMOVAL TRUNK  2016   sebaceous cyst   CYSTOSCOPY  04/22/2016   Procedure: CYSTOSCOPY;  Surgeon: [name], MD;  Location: [facility];  Service: Urology;;   ERCP N/A 06/23/2021   Procedure: ENDOSCOPIC RETROGRADE CHOLANGIOPANCREATOGRAPHY (ERCP);  Surgeon: Irving Copas., MD;  Location: [facility];  Service: Gastroenterology;  Laterality: N/A;   IR ANGIO INTRA EXTRACRAN SEL COM CAROTID INNOMINATE BILAT MOD SED  05/30/2020   IR ANGIO VERTEBRAL SEL SUBCLAVIAN INNOMINATE UNI R MOD SED  01/25/2020   IR ANGIO VERTEBRAL SEL VERTEBRAL BILAT MOD SED  05/30/2020   IR CT HEAD LTD  01/25/2020   IR PERCUTANEOUS ART THROMBECTOMY/INFUSION INTRACRANIAL INC DIAG ANGIO  01/25/2020   IR US GUIDE VASC ACCESS RIGHT  05/30/2020   KNEE ARTHROSCOPY  2007   right   LOOP RECORDER INSERTION N/A 01/28/2020   Procedure: LOOP RECORDER INSERTION;  Surgeon: [name], MD;  Location:  [facility];  Service: Cardiovascular;  Laterality: N/A;   PANCREATIC STENT PLACEMENT  06/23/2021   Procedure: PANCREATIC STENT PLACEMENT;  Surgeon: Irving Copas., MD;  Location: [facility];  Service: Gastroenterology;;   PROSTATE BIOPSY  2008   PROSTATE BIOPSY  2013   PROSTATE BIOPSY  01/28/2016   RADIOACTIVE SEED IMPLANT N/A 04/22/2016   Procedure: RADIOACTIVE SEED IMPLANT/BRACHYTHERAPY IMPLANT;  Surgeon: [name], MD;  [facility];  Service: Urology;  Laterality: N/A;   RADIOLOGY WITH ANESTHESIA N/A 01/24/2020   Procedure: IR WITH ANESTHESIA;  Surgeon: [name], MD;  Location: [facility];  Service: Radiology;  Laterality: N/A;   REMOVAL OF STONES  06/23/2021   Procedure: REMOVAL OF STONES;  Surgeon: Rush Landmark Telford Nab., MD;  Location: [facility];  Service: Gastroenterology;;   Joan Mayans  06/23/2021   Procedure: Joan Mayans;  Surgeon: Rush Landmark Telford Nab., MD;  Location: [facility];  Service: Gastroenterology;;   TEE WITHOUT CARDIOVERSION N/A 01/28/2020   Procedure: TRANSESOPHAGEAL ECHOCARDIOGRAM (TEE);  Surgeon: [name], MD;  Location: [facility];  Service: Cardiovascular;  Laterality: N/A;   TONSILLECTOMY     TOTAL HIP ARTHROPLASTY  2001   left   TOTAL HIP ARTHROPLASTY Right 01/18/2018   Procedure: RIGHT TOTAL HIP ARTHROPLASTY ANTERIOR APPROACH;  Surgeon: [name], MD;  Location: [facility];  Service: Orthopedics;  Laterality: Right;    SocHx  reports that he quit smoking about 22 years ago. His smoking use included cigarettes. He has a 32.00 pack-year smoking history. He has never used smokeless tobacco. He reports previous alcohol use of about 14.0 standard drinks of alcohol per week. He reports that he does not use drugs.  Allergies  Allergen Reactions   Sulfa Antibiotics Swelling and Other (See Comments)    Swelling in ankles  Swelling in ankles   Advil [Ibuprofen] Other (See Comments)    "Dots on chest" (Petechiae)   Aleve [Naproxen] Other (See Comments)    "Dots on chest" (Petechiae)   Betadine [Povidone Iodine] Itching and Rash   Chloroxylenol (Antiseptic) Itching, Rash and Other (See Comments)    PCMX surgical sterilizing scrub   Poison Ivy Extract Rash   Povidone-Iodine Itching and Rash    BETADINE    FamHx Family History  Problem Relation Age of Onset   Pancreatic cancer Mother        small bowel cancer    Heart disease Father    Colon cancer Neg Hx    Stomach cancer Neg Hx     Prior to Admission medications   Medication Sig Start Date End Date Taking? Authorizing Provider  acetaminophen (TYLENOL) 500 MG tablet Take 500 mg by mouth daily as needed (pain).    [provider]  aspirin EC 81 MG EC tablet Take 1 tablet (81 mg total) by mouth daily. Swallow whole. 06/27/21   Shawna Clamp, MD  buPROPion (WELLBUTRIN XL) 300 MG 24 hr tablet TAKE 1 TABLET(300 MG) BY MOUTH DAILY 02/05/21   Isaac Bliss, Rayford Halsted, MD  folic acid (FOLVITE) 1 MG tablet TAKE 1 TABLET(1 MG) BY MOUTH DAILY 02/05/21   Isaac Bliss, Rayford Halsted, MD  lisinopril-hydrochlorothiazide (ZESTORETIC) 20-25 MG tablet TAKE 1 TABLET BY MOUTH DAILY 05/06/21   Isaac Bliss, Rayford Halsted, MD  Magnesium 250 MG TABS Take 250 mg by mouth every morning.    [provider]  Multiple Vitamin (MULTIVITAMIN WITH MINERALS) TABS tablet Take 1 tablet by mouth daily. 01/29/20   [name], NP  pantoprazole (PROTONIX) 40 MG tablet Take 1 tablet (40 mg total) by mouth daily. 06/04/21   Isaac Bliss, Rayford Halsted, MD  polyethylene glycol (MIRALAX / GLYCOLAX) 17 g packet Take 17 g by mouth daily as needed (constipation).    [provider]  traZODone (DESYREL) 100 MG tablet TAKE 1 TABLET(100 MG) BY MOUTH AT BEDTIME AS NEEDED FOR SLEEP 03/03/21   Isaac Bliss, Rayford Halsted, MD    Physical Exam: Vitals:   07/10/21 1230 07/10/21 1245 07/10/21 1300 07/10/21 1315  BP: (!) 89/53  (!) 91/55   Pulse: 74 74 75 73  Resp: '17 18 17 15  '$ Temp:      SpO2: 100% 100% 100% 100%  Weight:      Height:        General: 70 y.o. male resting in bed in NAD Eyes: PERRL, icteric sclera ENMT: Nares patent w/o discharge, orophaynx clear, dentition normal, ears w/o discharge/lesions/ulcers Neck: Supple, trachea midline Cardiovascular: RRR, +S1, S2, no m/g/r, equal pulses throughout Respiratory: CTABL, no w/r/r, normal WOB GI: BS+, NDNT, no masses  noted, no organomegaly noted MSK: No e/c/c Skin: No rashes, bruises, ulcerations noted; jaundiced Neuro: A&O x 3, chronic RUE weakness d/t previous stroke Psyc: Pleasantly confused and odd affect, calm/cooperative  Labs on Admission: I have personally reviewed following labs and imaging studies  CBC: Recent Labs  Lab 07/10/21 1004 07/10/21 1021  WBC 10.8*  --   NEUTROABS 9.8*  --   HGB 9.5* 10.2*  HCT 29.2* 30.0*  MCV 95.1  --   PLT 395  --    Basic Metabolic Panel: Recent Labs  Lab 07/10/21 1004 07/10/21 1021  NA 131* 133*  K 3.8 3.9  CL 99 99  CO2 24  --   GLUCOSE 95 95  BUN 15 12  CREATININE 0.80 0.60*  CALCIUM 8.9  --    GFR: Estimated Creatinine Clearance: 78.2 mL/min (A) (by C-G formula based on SCr of 0.6 mg/dL (L)). Liver Function Tests: Recent Labs  Lab 07/10/21 1004  AST 94*  ALT 113*  ALKPHOS 845*  BILITOT 7.5*  PROT 6.6  ALBUMIN 2.5*   Recent Labs  Lab 07/10/21 1004  LIPASE 27   Recent Labs  Lab 07/10/21 1004  AMMONIA 50*   Coagulation Profile: Recent Labs  Lab 07/10/21 1004  INR 1.0   Cardiac Enzymes: No results for input(s): CKTOTAL, CKMB, CKMBINDEX, TROPONINI in the last 168 hours. BNP (last 3 results) No results for input(s): PROBNP in the last 8760 hours. HbA1C: No results for input(s): HGBA1C in the last 72 hours. CBG: No results for input(s): GLUCAP in the last 168 hours. Lipid Profile: No results for input(s): CHOL, HDL, LDLCALC, TRIG, CHOLHDL, LDLDIRECT in the last 72 hours. Thyroid Function Tests: No results for input(s): TSH, T4TOTAL, FREET4, T3FREE, THYROIDAB in the last 72 hours. Anemia Panel: No results for input(s): VITAMINB12, FOLATE, FERRITIN, TIBC, IRON, RETICCTPCT in the last 72 hours. Urine analysis:    Component Value Date/Time   COLORURINE AMBER (A) 06/22/2021 2100   APPEARANCEUR HAZY (A) 06/22/2021 2100   LABSPEC 1.023 06/22/2021 2100   PHURINE 6.0 06/22/2021 2100   GLUCOSEU NEGATIVE 06/22/2021 2100    HGBUR SMALL (A) 06/22/2021 2100   HGBUR negative 03/13/2010 1145   BILIRUBINUR MODERATE (A) 06/22/2021 2100   BILIRUBINUR 1+ 11/13/2019 1341   KETONESUR 5 (A) 06/22/2021 2100   PROTEINUR 30 (A) 06/22/2021 2100   UROBILINOGEN 0.2 11/13/2019 1341   UROBILINOGEN 0.2 03/13/2010 1145  NITRITE NEGATIVE 06/22/2021 2100   LEUKOCYTESUR NEGATIVE 06/22/2021 2100    Radiological Exams on Admission: CT ABDOMEN PELVIS W CONTRAST  Result Date: 07/10/2021 CLINICAL DATA:  Abdominal abscess/infection. Altered mental status. Biliary tumor, likely cholangiocarcinoma. Jaundice. EXAM: CT ABDOMEN AND PELVIS WITH CONTRAST TECHNIQUE: Multidetector CT imaging of the abdomen and pelvis was performed using the standard protocol following bolus administration of intravenous contrast. CONTRAST:  68m OMNIPAQUE IOHEXOL 350 MG/ML SOLN COMPARISON:  06/11/2021 and MRI from 06/23/2021 FINDINGS: Lower chest: Mild dependent atelectasis in both lower lobes. Descending thoracic aortic atherosclerotic calcification. Hepatobiliary: There has been placement of a biliary stent which extends into the left bile duct. Interval decompression of the left biliary tree although there is continued moderate biliary dilatation in the right hepatic lobe. Indistinct tissue planes along the confluence of the right and left hepatic lobes suspicious for Klatskin tumor. Contracted gallbladder containing high density material compatible with contrast medium. 1.1 by 0.9 cm cyst in segment 2 of the liver. Nonspecific 0.6 cm hypodense lesion in segment 4a. Probable 1.2 by 0.8 cm cyst in segment 4b of the liver on image 28 series 2, versus a dilated biliary element. Nonspecific 0.3 cm hypodense lesion posteriorly in the right hepatic lobe on image 31 series 2. Nonspecific hypodense peripheral lesion in the right hepatic lobe measuring 1.3 by 1.0 cm on image 18 series 2, although previously characterized as a hemangioma. Pancreas: Unremarkable Spleen:  Unremarkable Adrenals/Urinary Tract: 5.1 by 4.3 cm Bosniak category 1 cyst of the right kidney lower pole. Distal ureters in urinary bladder obscured by streak artifact from the patient's hip implants. 0.6 cm stone or cluster of stones in the left kidney lower pole on image 69 series 4. Adrenal glands unremarkable. Stomach/Bowel: Prominent stool throughout the colon favors constipation. Vascular/Lymphatic: Aortoiliac atherosclerotic vascular disease. Reproductive: Brachytherapy seeds in the prostate gland, otherwise obscured by streak artifact from the hip implants. Hypodense lesion in the right scrotum measuring up to 2.9 cm in diameter, possibly an epididymal cyst or spermatocele but not fully characterized on today's exam. Other: No intra-abdominal abscess is identified. Musculoskeletal: Hernia mesh markers along the lower anterior abdominal wall. Bilateral total hip prostheses, a 1.3 by 0.9 cm lucency along the upper margin of the right acetabular shell component is nonspecific but could conceivably reflect early particulate disease. Slightly exaggerated posterior angulation of the left acetabular shell component. Old healed left posterior rib fractures. Multilevel lumbar spondylosis and degenerative disc disease with resulting lumbar impingement. IMPRESSION: 1. Interval placement of a biliary stent extending from the duodenum into the left biliary system. There has been decompression of the left biliary system but the right hepatic lobe biliary system is still moderately dilated. 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 2. Several small hypodense lesions in the liver are probably cysts or similar benign lesions. One of these has been previously characterized as a hemangioma. Surveillance suggested. 3. No compelling findings of distant metastatic spread. 4.  Prominent stool throughout the colon favors constipation. 5. Other imaging findings of potential clinical significance: Minimal atelectasis in both  lower lobes. Aortic Atherosclerosis (ICD10-I70.0). Nonobstructive left nephrolithiasis. Brachytherapy seeds in the prostate gland. Possible right epididymal cyst or spermatocele. Lumbar spondylosis and degenerative disc disease causing multilevel impingement. Electronically Signed   By: WVan ClinesM.D.   On: 07/10/2021 12:48   DG Chest Port 1 View  Result Date: 07/10/2021 CLINICAL DATA:  Questionable sepsis EXAM: PORTABLE CHEST 1 VIEW COMPARISON:  03/18/2016 FINDINGS: Normal heart size and mediastinal  contours. There is no edema, consolidation, effusion, or pneumothorax. Subcutaneous implantable cardiac device. IMPRESSION: No evidence of active disease. Electronically Signed   By: Monte Fantasia M.D.   On: 07/10/2021 09:39    EKG: Independently reviewed. 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   Difficult to place patient No  Time spent coordinating admission: 70 minutes  [facility]  If 7PM-7AM, please contact night-coverage [email or web address]  07/10/2021, 1:40  PM

## 2021-07-10 NOTE — Progress Notes (Signed)
A consult was received from an ED physician for vancomycin and cefepime per pharmacy dosing (for an indication other than meningitis). The patient's profile has been reviewed for ht/wt/allergies/indication/available labs. A one time order has been placed for the above antibiotics.  Further antibiotics/pharmacy consults should be ordered by admitting physician if indicated.                       Reuel Boom, PharmD, BCPS [phone] 07/10/2021, 9:31 AM

## 2021-07-10 NOTE — ED Notes (Signed)
Pt placed on male primofit upon request.

## 2021-07-10 NOTE — ED Provider Notes (Signed)
Emergency Medicine Provider Triage Evaluation Note  Ruben Chavez , a 70 y.o. male  was evaluated in triage.  Pt complains of altered mental status and fever at home for 2 days.  Recently diagnosed with hepatocellular carcinoma, confused, unable to provide any further insight into his course..  Review of Systems  Positive: Weakness, fever Negative: Chest pain, shortness of breat  Physical Exam  BP (!) 84/59   Pulse 96   Temp 98.6 F (37 C)   Resp 20   Ht '5\' 11"'$  (1.803 m)   Wt 67 kg   SpO2 96%   BMI 20.60 kg/m  Gen:   Awake, no distress   Resp:  Normal effort  MSK:   Moves extremities without difficulty  Other:  Hypotensive with systolic in the [id number].  Jaundiced with petechiae.  Lower abdomen generally tender to palpation.  Medical Decision Making  Medically screening exam initiated at 9:01 AM.  Appropriate orders placed.  [name] was informed that the remainder of the evaluation will be completed by another provider, this initial triage assessment does not replace that evaluation, and the importance of remaining in the ED until their evaluation is complete.  Patient to be roomed next, RN informed.  Attending physician at the bedside to further evaluate the patient.  This chart was dictated using voice recognition software, Dragon. Despite the best efforts of this provider to proofread and correct errors, errors may still occur which can change documentation meaning.    Aura Dials 07/10/21 0902    Godfrey Pick, MD 07/10/21 [phone]

## 2021-07-10 NOTE — ED Triage Notes (Signed)
Per EMS, from home, fever x2 days. Worsening weakness x1 week. Hx liver cancer. Non ambulatory due to weakness.

## 2021-07-10 NOTE — Sepsis Progress Note (Signed)
eLink is following this Code Sepsis. °

## 2021-07-10 NOTE — ED Provider Notes (Signed)
[facility] DEPT Provider Note   CSN: [id number] Arrival date & time: 07/10/21  0830     History Chief Complaint  Patient presents with   Fever    Ruben Chavez is a 70 y.o. male.   Fever Associated symptoms: chills and confusion   Associated symptoms: no chest pain, no congestion, no cough, no diarrhea, no dysuria, no ear pain, no myalgias, no nausea, no rash, no sore throat and no vomiting   70 year old male with history of bile duct cancer presenting for 2 days of fever and generalized weakness.  On Monday (4 days ago), he had a liver shunt placed via endoscopic procedure.  At the time, he had a previously placed pancreatic shunt removed.  2 days later he developed severe fatigue and chills.  He has continued to have severe fatigue and chills over the past 2 days.  He was started on ciprofloxacin which he is on day 3 of.  At baseline, he is able to ambulate and be active.  He has not even been able to get to the edge of the bed for the past couple days.  He has been able to tolerate p.o. and his wife has been encouraging food and drink intake.  Since his new shunt was placed, his jaundice has improved, per his wife.  T-max at home was 102 degrees.  Patient's wife reports that he has intermittent confusion that appears to be present when he has fever.    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   Malnutrition of moderate degree 06/24/2021   Abnormal abdominal CT scan 06/19/2021   Jaundice 06/19/2021   Weight loss, unintentional 06/19/2021   Bile duct obstruction 06/19/2021   Left hemiparesis ([place]) 03/28/2020   Chronic  bilateral low back pain without sciatica 02/25/2020   Spasticity as late effect of cerebrovascular accident (CVA) 02/25/2020   Cognitive and neurobehavioral dysfunction    Hypertensive urgency 01/28/2020   Urinary retention 01/28/2020   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   Stroke (cerebrum) ([place]) -  [name] 01/25/2020   Acute respiratory failure with hypoxia (HCC)    Hypotension    Hypokalemia    Hypocalcemia    Encephalopathy acute    Stroke ([place]) 01/24/2020   Middle cerebral artery embolism, right 01/24/2020   Hyperlipidemia 02/21/2019   Impingement syndrome of right shoulder region 02/21/2018   OA (osteoarthritis) of hip 01/18/2018   Prostate cancer ([place]) 03/04/2016   Essential hypertension 11/27/2014   History of colonic polyps 09/26/2014   BACK PAIN, LEFT 12/22/2009   PROSTATE SPECIFIC ANTIGEN, ELEVATED 12/20/2008   LATERAL EPICONDYLITIS 07/15/2008   ACTINIC KERATOSIS, FOREHEAD, LEFT 05/23/2008   WART, LEFT HAND 07/19/2007   ERECTILE DYSFUNCTION, MILD 07/19/2007   HERNIA, BILATERAL INGUINAL W/O OBST/GANGRENE 07/19/2007   MENISCUS TEAR 07/19/2007   Bilateral inguinal hernia 07/19/2007    Past Surgical History:  Procedure Laterality Date   bilateral inguinal hernia  2008   BILIARY BRUSHING  06/23/2021   Procedure: BILIARY BRUSHING;  Surgeon:  Mansouraty, Telford Nab., MD;  Location: [facility];  Service: Gastroenterology;;   BILIARY DILATION  06/23/2021   Procedure: BILIARY DILATION;  Surgeon: Irving Copas., MD;  Location: [facility];  Service: Gastroenterology;;   BILIARY STENT PLACEMENT N/A 06/23/2021   Procedure: BILIARY STENT PLACEMENT;  Surgeon: Irving Copas., MD;  Location: [facility];  Service: Gastroenterology;  Laterality: N/A;   BIOPSY  06/23/2021   Procedure: BIOPSY;  Surgeon: Rush Landmark Telford Nab., MD;  Location:  [facility];  Service: Gastroenterology;;   Kathleen Argue STUDY  01/28/2020   Procedure: BUBBLE STUDY;  Surgeon: [name], MD;  Location: [facility];  Service: Cardiovascular;;   CYST REMOVAL TRUNK  2016   sebaceous cyst   CYSTOSCOPY  04/22/2016   Procedure: CYSTOSCOPY;  Surgeon: [name], MD;  Location: [facility];  Service: Urology;;   ERCP N/A 06/23/2021   Procedure: ENDOSCOPIC RETROGRADE CHOLANGIOPANCREATOGRAPHY (ERCP);  Surgeon: Irving Copas., MD;  Location: [facility];  Service: Gastroenterology;  Laterality: N/A;   IR ANGIO INTRA EXTRACRAN SEL COM CAROTID INNOMINATE BILAT MOD SED  05/30/2020   IR ANGIO VERTEBRAL SEL SUBCLAVIAN INNOMINATE UNI R MOD SED  01/25/2020   IR ANGIO VERTEBRAL SEL VERTEBRAL BILAT MOD SED  05/30/2020   IR CT HEAD LTD  01/25/2020   IR PERCUTANEOUS ART THROMBECTOMY/INFUSION INTRACRANIAL INC DIAG ANGIO  01/25/2020   IR US GUIDE VASC ACCESS RIGHT  05/30/2020   KNEE ARTHROSCOPY  2007   right   LOOP RECORDER INSERTION N/A 01/28/2020   Procedure: LOOP RECORDER INSERTION;  Surgeon: [name], MD;  Location: [facility];  Service: Cardiovascular;  Laterality: N/A;   PANCREATIC STENT PLACEMENT  06/23/2021   Procedure: PANCREATIC STENT PLACEMENT;  Surgeon: Irving Copas., MD;  Location: [facility];  Service: Gastroenterology;;   PROSTATE BIOPSY  2008   PROSTATE BIOPSY  2013   PROSTATE BIOPSY  01/28/2016   RADIOACTIVE SEED IMPLANT N/A 04/22/2016   Procedure: RADIOACTIVE SEED IMPLANT/BRACHYTHERAPY IMPLANT;  Surgeon: [name], MD;  Location: [facility];  Service: Urology;  Laterality: N/A;   RADIOLOGY WITH ANESTHESIA N/A 01/24/2020   Procedure: IR WITH ANESTHESIA;  Surgeon: [name], MD;  Location: [facility];  Service: Radiology;  Laterality: N/A;   REMOVAL OF STONES  06/23/2021   Procedure: REMOVAL OF STONES;  Surgeon: Rush Landmark Telford Nab., MD;  Location: [facility];  Service:  Gastroenterology;;   Joan Mayans  06/23/2021   Procedure: Joan Mayans;  Surgeon: Rush Landmark Telford Nab., MD;  Location: [facility];  Service: Gastroenterology;;   TEE WITHOUT CARDIOVERSION N/A 01/28/2020   Procedure: TRANSESOPHAGEAL ECHOCARDIOGRAM (TEE);  Surgeon: [name], MD;  Location: [facility];  Service: Cardiovascular;  Laterality: N/A;   TONSILLECTOMY     TOTAL HIP ARTHROPLASTY  2001   left   TOTAL HIP ARTHROPLASTY Right 01/18/2018   Procedure: RIGHT TOTAL HIP ARTHROPLASTY ANTERIOR APPROACH;  Surgeon: [name], MD;  Location: [facility];  Service: Orthopedics;  Laterality: Right;       Family History  Problem Relation Age of Onset   Pancreatic cancer Mother        small bowel cancer   Heart disease Father    Colon cancer Neg Hx    Stomach cancer Neg Hx     Social History   Tobacco Use   Smoking status: Former    Packs/day: 1.00    Years: 32.00    Pack years: 32.00    Types: Cigarettes    Quit date:  06/06/1999    Years since quitting: 22.1   Smokeless tobacco: Never  Vaping Use   Vaping Use: Never used  Substance Use Topics   Alcohol use: Not Currently    Alcohol/week: 14.0 standard drinks    Types: 14 Shots of liquor per week   Drug use: No    Home Medications Prior to Admission medications   Medication Sig Start Date End Date Taking? Authorizing Provider  aspirin EC 325 MG tablet Take 325 mg by mouth daily.   Yes [provider]  buPROPion (WELLBUTRIN XL) 300 MG 24 hr tablet TAKE 1 TABLET(300 MG) BY MOUTH DAILY 02/05/21  Yes Isaac Bliss, Rayford Halsted, MD  ciprofloxacin (CIPRO) 500 MG tablet Take 500 mg by mouth 2 (two) times daily. 07/09/21  Yes [provider]  folic acid (FOLVITE) 1 MG tablet TAKE 1 TABLET(1 MG) BY MOUTH DAILY 02/05/21  Yes Isaac Bliss, Rayford Halsted, MD  lisinopril-hydrochlorothiazide (ZESTORETIC) 20-25 MG tablet TAKE 1 TABLET BY MOUTH DAILY 05/06/21  Yes Isaac Bliss, Rayford Halsted, MD  Magnesium 250 MG TABS  Take 250 mg by mouth every morning.   Yes [provider]  Multiple Vitamin (MULTIVITAMIN WITH MINERALS) TABS tablet Take 1 tablet by mouth daily. 01/29/20  Yes [name], NP  pantoprazole (PROTONIX) 40 MG tablet Take 1 tablet (40 mg total) by mouth daily. 06/04/21  Yes Isaac Bliss, Rayford Halsted, MD  polyethylene glycol (MIRALAX / GLYCOLAX) 17 g packet Take 17 g by mouth daily as needed (constipation).   Yes [provider]  traZODone (DESYREL) 100 MG tablet TAKE 1 TABLET(100 MG) BY MOUTH AT BEDTIME AS NEEDED FOR SLEEP Patient taking differently: Take 100 mg by mouth at bedtime. 03/03/21  Yes Isaac Bliss, Rayford Halsted, MD  aspirin EC 81 MG EC tablet Take 1 tablet (81 mg total) by mouth daily. Swallow whole. Patient not taking: No sig reported 06/27/21   Shawna Clamp, MD    Allergies    Sulfa antibiotics, Advil [ibuprofen], Aleve [naproxen], Betadine [povidone iodine], Chloroxylenol (antiseptic), Poison ivy extract, and Povidone-iodine  Review of Systems   Review of Systems  Constitutional:  Positive for activity change, chills, fatigue and fever.  HENT:  Negative for congestion, ear pain, facial swelling, sore throat and trouble swallowing.   Eyes:  Negative for visual disturbance.  Respiratory:  Negative for cough, shortness of breath and wheezing.   Cardiovascular:  Negative for chest pain, palpitations and leg swelling.  Gastrointestinal:  Negative for abdominal pain, diarrhea, nausea and vomiting.  Genitourinary:  Negative for dysuria and hematuria.  Musculoskeletal:  Positive for gait problem. Negative for arthralgias, back pain, myalgias and neck pain.  Skin:  Positive for color change. Negative for rash.  Neurological:  Positive for weakness (Generalized). Negative for seizures and syncope.  Psychiatric/Behavioral:  Positive for confusion.   All other systems reviewed and are negative.  Physical Exam Updated Vital Signs BP (!) 95/57   Pulse 70   Temp 98.6  F (37 C)   Resp 16   Ht _0  (1.753 m)   Wt 65.3 kg   SpO2 100%   BMI 21.27 kg/m   Physical Exam Vitals and nursing note reviewed.  Constitutional:      General: He is not in acute distress.    Appearance: He is well-developed. He is ill-appearing (Chronically). He is not toxic-appearing or diaphoretic.  HENT:     Head: Normocephalic and atraumatic.     Right Ear: External ear normal.  Left Ear: External ear normal.     Nose: Nose normal.     Mouth/Throat:     Mouth: Mucous membranes are moist.     Pharynx: Oropharynx is clear.  Eyes:     Conjunctiva/sclera: Conjunctivae normal.  Cardiovascular:     Rate and Rhythm: Normal rate and regular rhythm.     Heart sounds: No murmur heard. Pulmonary:     Effort: Pulmonary effort is normal. No respiratory distress.     Breath sounds: Normal breath sounds.  Abdominal:     General: Abdomen is flat.     Palpations: Abdomen is soft.     Tenderness: There is no abdominal tenderness. There is no guarding.  Musculoskeletal:        General: No swelling or tenderness.     Cervical back: Neck supple.     Right lower leg: No edema.     Left lower leg: No edema.  Skin:    General: Skin is warm and dry.     Coloration: Skin is jaundiced.  Neurological:     General: No focal deficit present.     Mental Status: He is alert and oriented to person, place, and time.     Cranial Nerves: No cranial nerve deficit.     Sensory: No sensory deficit.     Motor: No weakness.     Coordination: Coordination normal.  Psychiatric:        Mood and Affect: Mood normal.        Behavior: Behavior normal.        Thought Content: Thought content normal.        Judgment: Judgment normal.    ED Results / Procedures / Treatments   Labs (all labs ordered are listed, but only abnormal results are displayed) Labs Reviewed  COMPREHENSIVE METABOLIC PANEL - Abnormal; Notable for the following components:      Result Value   Sodium 131 (*)    Albumin  2.5 (*)    AST 94 (*)    ALT 113 (*)    Alkaline Phosphatase 845 (*)    Total Bilirubin 7.5 (*)    All other components within normal limits  CBC WITH DIFFERENTIAL/PLATELET - Abnormal; Notable for the following components:   WBC 10.8 (*)    RBC 3.07 (*)    Hemoglobin 9.5 (*)    HCT 29.2 (*)    RDW 17.4 (*)    Neutro Abs 9.8 (*)    Lymphs Abs 0.3 (*)    All other components within normal limits  URINALYSIS, ROUTINE W REFLEX MICROSCOPIC - Abnormal; Notable for the following components:   Color, Urine AMBER (*)    Specific Gravity, Urine >1.046 (*)    All other components within normal limits  AMMONIA - Abnormal; Notable for the following components:   Ammonia 50 (*)    All other components within normal limits  I-STAT CHEM 8, ED - Abnormal; Notable for the following components:   Sodium 133 (*)    Creatinine, Ser 0.60 (*)    Calcium, Ion 1.12 (*)    Hemoglobin 10.2 (*)    HCT 30.0 (*)    All other components within normal limits  RESP PANEL BY RT-PCR (FLU A&B, COVID) ARPGX2  CULTURE, BLOOD (ROUTINE X 2)  CULTURE, BLOOD (ROUTINE X 2)  URINE CULTURE  LACTIC ACID, PLASMA  LACTIC ACID, PLASMA  PROTIME-INR  APTT  LIPASE, BLOOD  PROTIME-INR  PROTIME-INR  CORTISOL-AM, BLOOD  PROCALCITONIN  COMPREHENSIVE METABOLIC PANEL  CBC    EKG None  Radiology CT ABDOMEN PELVIS W CONTRAST  Result Date: 07/10/2021 CLINICAL DATA:  Abdominal abscess/infection. Altered mental status. Biliary tumor, likely cholangiocarcinoma. Jaundice. EXAM: CT ABDOMEN AND PELVIS WITH CONTRAST TECHNIQUE: Multidetector CT imaging of the abdomen and pelvis was performed using the standard protocol following bolus administration of intravenous contrast. CONTRAST:  83m OMNIPAQUE IOHEXOL 350 MG/ML SOLN COMPARISON:  06/11/2021 and MRI from 06/23/2021 FINDINGS: Lower chest: Mild dependent atelectasis in both lower lobes. Descending thoracic aortic atherosclerotic calcification. Hepatobiliary: There has been  placement of a biliary stent which extends into the left bile duct. Interval decompression of the left biliary tree although there is continued moderate biliary dilatation in the right hepatic lobe. Indistinct tissue planes along the confluence of the right and left hepatic lobes suspicious for Klatskin tumor. Contracted gallbladder containing high density material compatible with contrast medium. 1.1 by 0.9 cm cyst in segment 2 of the liver. Nonspecific 0.6 cm hypodense lesion in segment 4a. Probable 1.2 by 0.8 cm cyst in segment 4b of the liver on image 28 series 2, versus a dilated biliary element. Nonspecific 0.3 cm hypodense lesion posteriorly in the right hepatic lobe on image 31 series 2. Nonspecific hypodense peripheral lesion in the right hepatic lobe measuring 1.3 by 1.0 cm on image 18 series 2, although previously characterized as a hemangioma. Pancreas: Unremarkable Spleen: Unremarkable Adrenals/Urinary Tract: 5.1 by 4.3 cm Bosniak category 1 cyst of the right kidney lower pole. Distal ureters in urinary bladder obscured by streak artifact from the patient's hip implants. 0.6 cm stone or cluster of stones in the left kidney lower pole on image 69 series 4. Adrenal glands unremarkable. Stomach/Bowel: Prominent stool throughout the colon favors constipation. Vascular/Lymphatic: Aortoiliac atherosclerotic vascular disease. Reproductive: Brachytherapy seeds in the prostate gland, otherwise obscured by streak artifact from the hip implants. Hypodense lesion in the right scrotum measuring up to 2.9 cm in diameter, possibly an epididymal cyst or spermatocele but not fully characterized on today's exam. Other: No intra-abdominal abscess is identified. Musculoskeletal: Hernia mesh markers along the lower anterior abdominal wall. Bilateral total hip prostheses, a 1.3 by 0.9 cm lucency along the upper margin of the right acetabular shell component is nonspecific but could conceivably reflect early particulate  disease. Slightly exaggerated posterior angulation of the left acetabular shell component. Old healed left posterior rib fractures. Multilevel lumbar spondylosis and degenerative disc disease with resulting lumbar impingement. IMPRESSION: 1. Interval placement of a biliary stent extending from the duodenum into the left biliary system. There has been decompression of the left biliary system but the right hepatic lobe biliary system is still moderately dilated. 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 2. Several small hypodense lesions in the liver are probably cysts or similar benign lesions. One of these has been previously characterized as a hemangioma. Surveillance suggested. 3. No compelling findings of distant metastatic spread. 4.  Prominent stool throughout the colon favors constipation. 5. Other imaging findings of potential clinical significance: Minimal atelectasis in both lower lobes. Aortic Atherosclerosis (ICD10-I70.0). Nonobstructive left nephrolithiasis. Brachytherapy seeds in the prostate gland. Possible right epididymal cyst or spermatocele. Lumbar spondylosis and degenerative disc disease causing multilevel impingement. Electronically Signed   By: WVan ClinesM.D.   On: 07/10/2021 12:48   DG Chest Port 1 View  Result Date: 07/10/2021 CLINICAL DATA:  Questionable sepsis EXAM: PORTABLE CHEST 1 VIEW COMPARISON:  03/18/2016 FINDINGS: Normal heart size and mediastinal contours. There is no edema, consolidation, effusion, or pneumothorax.  Subcutaneous implantable cardiac device. IMPRESSION: No evidence of active disease. Electronically Signed   By: Monte Fantasia M.D.   On: 07/10/2021 09:39    Procedures Procedures   Medications Ordered in ED Medications  aspirin EC tablet 325 mg (has no administration in time range)  pantoprazole (PROTONIX) EC tablet 40 mg (40 mg Oral Given 04/13/96 7414)  folic acid (FOLVITE) tablet 1 mg (1 mg Oral Given 07/10/21 1744)  multivitamin with  minerals tablet 1 tablet (1 tablet Oral Given 07/10/21 1744)  enoxaparin (LOVENOX) injection 40 mg (40 mg Subcutaneous Given 07/10/21 1744)  0.9 %  sodium chloride infusion ( Intravenous New Bag/Given 07/10/21 1743)  metroNIDAZOLE (FLAGYL) IVPB 500 mg (has no administration in time range)  ondansetron (ZOFRAN) tablet 4 mg (has no administration in time range)    Or  ondansetron (ZOFRAN) injection 4 mg (has no administration in time range)  lactated ringers bolus 1,000 mL (0 mLs Intravenous Stopped 07/10/21 1237)  lactated ringers bolus 1,000 mL (0 mLs Intravenous Stopped 07/10/21 1405)  ceFEPIme (MAXIPIME) 2 g in sodium chloride 0.9 % 100 mL IVPB (0 g Intravenous Stopped 07/10/21 1105)  metroNIDAZOLE (FLAGYL) IVPB 500 mg (0 mg Intravenous Stopped 07/10/21 1155)  vancomycin (VANCOREADY) IVPB 1250 mg/250 mL (0 mg Intravenous Stopped 07/10/21 1352)  iohexol (OMNIPAQUE) 350 MG/ML injection 80 mL (80 mLs Intravenous Contrast Given 07/10/21 1137)    ED Course  I have reviewed the triage vital signs and the nursing notes.  Pertinent labs & imaging results that were available during my care of the patient were reviewed by me and considered in my medical decision making (see chart for details).    MDM Rules/Calculators/A&P                           Patient is a 70 year old male who presents for 2 days of chills, rigors, generalized weakness, and fatigue.  On arrival in the ED, patient found to be hypotensive with blood pressure of 84/59.  Despite this, patient maintained a normal heart rate.  Patient arrives afebrile, however, he did receive Tylenol prior to arrival.  His wife is present at bedside.  Patient's wife states that he has had intermittent confusion that seems to be present when he fevers.  T-max at home was 102 degrees.  In the ED, patient is alert and oriented.  Laboratory work-up was initiated.  IV fluids and broad-spectrum antibiotics were ordered.  Lab work was unremarkable.  Patient has elevated  hepatobiliary enzymes, however, these appear to be downtrending from his previous lab work.  Given no clear source of infection and his recent procedure, CT scan of abdomen pelvis was ordered.  CT scan shows decompression of the biliary system on the left with right hepatic lobe biliary ducts still moderately dilated.  There were no findings of intra-abdominal infection/abscess on CT scan.  Source of recent symptoms is still indeterminate.  Following 30 cc/kg bolus of IV fluids, patient's blood pressures remained low in the range of 90s over 50s.  He continued to deny any physical complaints.  He remained alert and oriented.  Patient was admitted to hospitalist for further management.  Final Clinical Impression(s) / ED Diagnoses Final diagnoses:  Fever and chills    Rx / DC Orders ED Discharge Orders     None        Godfrey Pick, MD 07/10/21 1750

## 2021-07-10 NOTE — Consult Note (Addendum)
Consultation  Referring Provider:  Dr. Marylyn Ishihara    Primary Care Physician:  Isaac Bliss, Rayford Halsted, MD Primary Gastroenterologist:   Dr. Fuller Plan / Dr. Jocelyn Lamer ( [facility])    Reason for Consultation: Biliary obstruction, cholangiocarcinoma, fevers          HPI:   Ruben Chavez is a 70 y.o. male with a past medical history as listed below including hypertension and CVA, who presented to the ER today with fever and generalized weakness.    Patient underwent ERCP on 07/07/2021 as below at [facility], stent placement into the left system but could not get into the right.  Per review of LFTs today it appears that all LFTs have decreased alk phos 845 (1460 on 07/03/2021), AST 94 (143 on 07/03/2021), ALT 113 (168 on 07/03/2021) with a total bili of 7.5 (12.5 on 7/29).  White count minimally elevated at 10.8 (8.8 on 07/03/2021).    Today, I spoke with the patient but he is very confused as to timelines and exactly what is going on, he mentions doctors names who are not involved with his care.  Due to this I called his wife to get a little bit better history.  She explains that he started with a fever on Wednesday evening, 07/08/2021 for which she started him on Tylenol, along with that became somewhat confused.  She tells me that he always has "stroke brain", but this typically only hinders his short-term memory and details and even this level of confusion is abnormal for him.  She was able to keep his fever mostly down, but after he started Ciprofloxacin yesterday morning he continued to feel bad and they brought him to the ER.  She tells me other than the fever and chills and some weakness and confusion he has been experiencing no other symptoms.    No change in bowel habits, no nausea, vomiting or complaints of abdominal pain.     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 Jyl Heinz at [facility]: At that time discussed patient had a 1 month history of worsening jaundice, associated weakness and poor appetite,  MRCP 7/18 showed diffuse intrahepatic biliary ductal dilation with abrupt caliber change at the biliary hilum suggesting a soft tissue mass ERCP 7/19 showed biliary tract obstruction secondary to mass of the common hepatic duct bifurcation, stent was placed, brushings obtained for cytology which resulted in malignant cells consistent with adenocarcinoma, CT chest obtained left lower lobe nodule concerning for possible metastatic disease; plan: At that time plan was for evaluation for chemotherapy and if good response could consider surgery, he had a referral made to med onc.  Also discussed that he needed a repeat ERCP for placement of additional stent is only  1 side of the liver is decompressed at first ERCP and bilirubin was still elevated (More GI history in the chart)  Past Medical History:  Diagnosis Date   Elevated PSA    Hyperlipidemia    pt says not anymore   Hypertension    Prostate cancer ([place]) 2013    Past Surgical History:  Procedure Laterality Date   bilateral inguinal hernia  2008   BILIARY BRUSHING  06/23/2021   Procedure: BILIARY BRUSHING;  Surgeon: Irving Copas., MD;  Location: [facility];  Service: Gastroenterology;;   BILIARY DILATION  06/23/2021    Procedure: BILIARY DILATION;  Surgeon: Irving Copas., MD;  Location: [facility];  Service: Gastroenterology;;   BILIARY STENT PLACEMENT N/A 06/23/2021   Procedure: BILIARY STENT PLACEMENT;  Surgeon: Irving Copas., MD;  Location: [facility];  Service: Gastroenterology;  Laterality: N/A;   BIOPSY  06/23/2021   Procedure: BIOPSY;  Surgeon: Rush Landmark Telford Nab., MD;  Location: [facility];  Service: Gastroenterology;;   Kathleen Argue STUDY  01/28/2020   Procedure: BUBBLE STUDY;  Surgeon: [name], MD;  Location: [place];  Service: Cardiovascular;;   CYST REMOVAL TRUNK  2016   sebaceous cyst   CYSTOSCOPY  04/22/2016   Procedure: CYSTOSCOPY;  Surgeon: [name], MD;  Location: [facility];  Service: Urology;;   ERCP N/A 06/23/2021   Procedure: ENDOSCOPIC RETROGRADE CHOLANGIOPANCREATOGRAPHY (ERCP);  Surgeon: Irving Copas., MD;  Location: [facility];  Service: Gastroenterology;  Laterality: N/A;   IR ANGIO INTRA EXTRACRAN SEL COM CAROTID INNOMINATE BILAT MOD SED  05/30/2020   IR ANGIO VERTEBRAL SEL SUBCLAVIAN INNOMINATE UNI R MOD SED  01/25/2020   IR ANGIO VERTEBRAL SEL VERTEBRAL BILAT MOD SED  05/30/2020   IR CT HEAD LTD  01/25/2020   IR PERCUTANEOUS ART THROMBECTOMY/INFUSION INTRACRANIAL INC DIAG ANGIO  01/25/2020   IR US GUIDE VASC ACCESS RIGHT  05/30/2020   KNEE ARTHROSCOPY  2007   right   LOOP RECORDER INSERTION N/A 01/28/2020   Procedure: LOOP RECORDER INSERTION;  Surgeon: [name], MD;  Location: [place] [facility];  Service: Cardiovascular;  Laterality: N/A;   PANCREATIC STENT PLACEMENT  06/23/2021   Procedure: PANCREATIC STENT PLACEMENT;  Surgeon: Irving Copas., MD;  Location: [facility];  Service: Gastroenterology;;   PROSTATE BIOPSY  2008   PROSTATE BIOPSY  2013   PROSTATE BIOPSY  01/28/2016   RADIOACTIVE SEED IMPLANT N/A 04/22/2016   Procedure: RADIOACTIVE SEED IMPLANT/BRACHYTHERAPY IMPLANT;  Surgeon: [name], MD;  Location: [facility];  Service: Urology;  Laterality: N/A;   RADIOLOGY WITH ANESTHESIA N/A 01/24/2020   Procedure: IR WITH ANESTHESIA;  Surgeon: [name], MD;  Location: [place];  Service: Radiology;  Laterality: N/A;   REMOVAL OF STONES  06/23/2021   Procedure: REMOVAL OF STONES;  Surgeon: Rush Landmark Telford Nab., MD;  Location: [facility];  Service: Gastroenterology;;   Joan Mayans  06/23/2021   Procedure: Joan Mayans;  Surgeon: Rush Landmark Telford Nab., MD;  Location: [facility];  Service: Gastroenterology;;   TEE WITHOUT CARDIOVERSION N/A 01/28/2020   Procedure: TRANSESOPHAGEAL ECHOCARDIOGRAM (TEE);  Surgeon: [name], MD;  Location: [facility];  Service: Cardiovascular;  Laterality: N/A;   TONSILLECTOMY     TOTAL HIP ARTHROPLASTY  2001   left   TOTAL HIP ARTHROPLASTY Right 01/18/2018   Procedure: RIGHT TOTAL HIP ARTHROPLASTY ANTERIOR APPROACH;  Surgeon: [name], MD;  Location: [facility];  Service: Orthopedics;  Laterality: Right;    Family History  Problem Relation Age of Onset   Pancreatic cancer Mother        small bowel cancer   Heart disease Father    Colon cancer Neg Hx    Stomach cancer Neg Hx     Social History   Tobacco Use   Smoking status: Former    Packs/day: 1.00    Years: 32.00    Pack years: 32.00    Types: Cigarettes    Quit date: 06/06/1999    Years since quitting: 22.1   Smokeless tobacco: Never  Vaping Use   Vaping Use: Never used  Substance Use Topics   Alcohol use: Not Currently    Alcohol/week: 14.0 standard drinks    Types: 14 Shots of liquor per week   Drug use: No    Prior to Admission medications   Medication Sig Start Date End Date Taking? Authorizing Provider  aspirin EC 325 MG tablet Take 325 mg by mouth daily.   Yes [provider]  buPROPion (WELLBUTRIN XL) 300 MG 24 hr tablet TAKE 1 TABLET(300 MG) BY MOUTH DAILY 02/05/21  Yes Isaac Bliss, Rayford Halsted, MD  ciprofloxacin  (CIPRO) 500 MG tablet Take 500 mg by mouth 2 (two) times daily. 07/09/21  Yes [provider]  folic acid (FOLVITE) 1 MG tablet TAKE 1 TABLET(1 MG) BY MOUTH DAILY 02/05/21  Yes Isaac Bliss, Rayford Halsted, MD  lisinopril-hydrochlorothiazide (ZESTORETIC) 20-25 MG tablet TAKE 1 TABLET BY MOUTH DAILY 05/06/21  Yes Isaac Bliss, Rayford Halsted, MD  Magnesium 250 MG TABS Take 250 mg by mouth every morning.   Yes [provider]  Multiple Vitamin (MULTIVITAMIN WITH MINERALS) TABS tablet Take 1 tablet by mouth daily. 01/29/20  Yes [name], NP  pantoprazole (PROTONIX) 40 MG tablet Take 1 tablet (40 mg total) by mouth daily. 06/04/21  Yes Isaac Bliss, Rayford Halsted, MD  polyethylene glycol (MIRALAX / GLYCOLAX) 17 g packet Take 17 g by mouth daily as needed (constipation).   Yes [provider]  traZODone (DESYREL) 100 MG tablet TAKE 1 TABLET(100 MG) BY MOUTH AT BEDTIME AS NEEDED FOR SLEEP Patient taking differently: Take 100 mg by mouth at bedtime. 03/03/21  Yes Isaac Bliss, Rayford Halsted, MD  aspirin EC 81 MG EC tablet Take 1 tablet (81 mg total) by mouth daily. Swallow whole. Patient not taking: No sig reported 06/27/21   Shawna Clamp, MD    No current facility-administered medications for this encounter.   Current Outpatient Medications  Medication Sig Dispense Refill   aspirin EC 325 MG tablet Take 325 mg by mouth daily.     buPROPion (WELLBUTRIN XL) 300 MG 24 hr tablet TAKE 1 TABLET(300 MG) BY MOUTH DAILY 90 tablet 1   ciprofloxacin (CIPRO) 500 MG tablet Take 500 mg by mouth 2 (two) times daily.     folic acid (FOLVITE) 1 MG tablet TAKE 1 TABLET(1 MG) BY MOUTH DAILY 90 tablet 1   lisinopril-hydrochlorothiazide (ZESTORETIC) 20-25 MG tablet TAKE 1 TABLET BY MOUTH DAILY 90 tablet 1   Magnesium 250 MG TABS Take 250 mg by mouth every morning.     Multiple Vitamin (MULTIVITAMIN WITH MINERALS) TABS tablet Take 1 tablet by mouth daily.     pantoprazole (PROTONIX) 40 MG tablet Take  1 tablet (40 mg total) by mouth daily. 90 tablet 1   polyethylene glycol (MIRALAX / GLYCOLAX) 17 g packet Take 17 g by mouth daily as needed (constipation).     traZODone (DESYREL) 100 MG tablet TAKE 1 TABLET(100  MG) BY MOUTH AT BEDTIME AS NEEDED FOR SLEEP (Patient taking differently: Take 100 mg by mouth at bedtime.) 90 tablet 1   aspirin EC 81 MG EC tablet Take 1 tablet (81 mg total) by mouth daily. Swallow whole. (Patient not taking: No sig reported) 30 tablet 1    Allergies as of 07/10/2021 - Review Complete 07/10/2021  Allergen Reaction Noted   Sulfa antibiotics Swelling and Other (See Comments) 01/18/2018   Advil [ibuprofen] Other (See Comments) 01/24/2020   Aleve [naproxen] Other (See Comments) 01/24/2020   Betadine [povidone iodine] Itching and Rash 01/18/2018   Chloroxylenol (antiseptic) Itching, Rash, and Other (See Comments) 01/03/2018   Poison ivy extract Rash 01/18/2018   Povidone-iodine Itching and Rash 03/09/2010     Review of Systems:    Constitutional: No weight loss Skin: No rash  Cardiovascular: No chest pain Respiratory: No SOB  Gastrointestinal: See HPI and otherwise negative Genitourinary: No dysuria  Neurological: +confusion Musculoskeletal: No new muscle or joint pain Hematologic: No bleeding  Psychiatric: No history of depression or anxiety    Physical Exam:  Vital signs in last 24 hours: Temp:  [98.6 F (37 C)] 98.6 F (37 C) (08/05 0851) Pulse Rate:  [72-101] 74 (08/05 1330) Resp:  [13-20] 15 (08/05 1330) BP: (71-96)/(50-65) 96/59 (08/05 1330) SpO2:  [94 %-100 %] 100 % (08/05 1330) Weight:  [65.3 kg-67 kg] 65.3 kg (08/05 1012)   General:   Pleasant, Jaundiced, Ill appearing, Caucasian male appears to be in NAD, Well developed, Well nourished, alert and cooperative Head:  Normocephalic and atraumatic. Eyes:   PEERL, EOMI. +icterus. Conjunctiva pink. Ears:  Normal auditory acuity. Neck:  Supple Throat: Oral cavity and pharynx without  inflammation, swelling or lesion.  Lungs: Respirations even and unlabored. Lungs clear to auscultation bilaterally.   No wheezes, crackles, or rhonchi.  Heart: Normal S1, S2. No MRG. Regular rate and rhythm. No peripheral edema, cyanosis or pallor.  Abdomen:  Soft, nondistended, nontender. No rebound or guarding. Normal bowel sounds. No appreciable masses or hepatomegaly. Rectal:  Not performed.  Msk:  Symmetrical without gross deformities. Peripheral pulses intact.  Extremities:  Without edema, no deformity or joint abnormality.  Neurologic:  Alert and  oriented x3 Skin:   Dry and intact without significant lesions or rashes. Psychiatric: Confused, impaired memory   LAB RESULTS: Recent Labs    07/10/21 1004 07/10/21 1021  WBC 10.8*  --   HGB 9.5* 10.2*  HCT 29.2* 30.0*  PLT 395  --    BMET Recent Labs    07/10/21 1004 07/10/21 1021  NA 131* 133*  K 3.8 3.9  CL 99 99  CO2 24  --   GLUCOSE 95 95  BUN 15 12  CREATININE 0.80 0.60*  CALCIUM 8.9  --    LFT Recent Labs    07/10/21 1004  PROT 6.6  ALBUMIN 2.5*  AST 94*  ALT 113*  ALKPHOS 845*  BILITOT 7.5*   PT/INR Recent Labs    07/10/21 1004  LABPROT 13.0  INR 1.0    STUDIES: CT ABDOMEN PELVIS W CONTRAST  Result Date: 07/10/2021 CLINICAL DATA:  Abdominal abscess/infection. Altered mental status. Biliary tumor, likely cholangiocarcinoma. Jaundice. EXAM: CT ABDOMEN AND PELVIS WITH CONTRAST TECHNIQUE: Multidetector CT imaging of the abdomen and pelvis was performed using the standard protocol following bolus administration of intravenous contrast. CONTRAST:  56m OMNIPAQUE IOHEXOL 350 MG/ML SOLN COMPARISON:  06/11/2021 and MRI from 06/23/2021 FINDINGS: Lower chest: Mild dependent atelectasis in both lower lobes.  Descending thoracic aortic atherosclerotic calcification. Hepatobiliary: There has been placement of a biliary stent which extends into the left bile duct. Interval decompression of the left biliary tree  although there is continued moderate biliary dilatation in the right hepatic lobe. Indistinct tissue planes along the confluence of the right and left hepatic lobes suspicious for Klatskin tumor. Contracted gallbladder containing high density material compatible with contrast medium. 1.1 by 0.9 cm cyst in segment 2 of the liver. Nonspecific 0.6 cm hypodense lesion in segment 4a. Probable 1.2 by 0.8 cm cyst in segment 4b of the liver on image 28 series 2, versus a dilated biliary element. Nonspecific 0.3 cm hypodense lesion posteriorly in the right hepatic lobe on image 31 series 2. Nonspecific hypodense peripheral lesion in the right hepatic lobe measuring 1.3 by 1.0 cm on image 18 series 2, although previously characterized as a hemangioma. Pancreas: Unremarkable Spleen: Unremarkable Adrenals/Urinary Tract: 5.1 by 4.3 cm Bosniak category 1 cyst of the right kidney lower pole. Distal ureters in urinary bladder obscured by streak artifact from the patient's hip implants. 0.6 cm stone or cluster of stones in the left kidney lower pole on image 69 series 4. Adrenal glands unremarkable. Stomach/Bowel: Prominent stool throughout the colon favors constipation. Vascular/Lymphatic: Aortoiliac atherosclerotic vascular disease. Reproductive: Brachytherapy seeds in the prostate gland, otherwise obscured by streak artifact from the hip implants. Hypodense lesion in the right scrotum measuring up to 2.9 cm in diameter, possibly an epididymal cyst or spermatocele but not fully characterized on today's exam. Other: No intra-abdominal abscess is identified. Musculoskeletal: Hernia mesh markers along the lower anterior abdominal wall. Bilateral total hip prostheses, a 1.3 by 0.9 cm lucency along the upper margin of the right acetabular shell component is nonspecific but could conceivably reflect early particulate disease. Slightly exaggerated posterior angulation of the left acetabular shell component. Old healed left posterior rib  fractures. Multilevel lumbar spondylosis and degenerative disc disease with resulting lumbar impingement. IMPRESSION: 1. Interval placement of a biliary stent extending from the duodenum into the left biliary system. There has been decompression of the left biliary system but the right hepatic lobe biliary system is still moderately dilated. 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 2. Several small hypodense lesions in the liver are probably cysts or similar benign lesions. One of these has been previously characterized as a hemangioma. Surveillance suggested. 3. No compelling findings of distant metastatic spread. 4.  Prominent stool throughout the colon favors constipation. 5. Other imaging findings of potential clinical significance: Minimal atelectasis in both lower lobes. Aortic Atherosclerosis (ICD10-I70.0). Nonobstructive left nephrolithiasis. Brachytherapy seeds in the prostate gland. Possible right epididymal cyst or spermatocele. Lumbar spondylosis and degenerative disc disease causing multilevel impingement. Electronically Signed   By: Van Clines M.D.   On: 07/10/2021 12:48   DG Chest Port 1 View  Result Date: 07/10/2021 CLINICAL DATA:  Questionable sepsis EXAM: PORTABLE CHEST 1 VIEW COMPARISON:  03/18/2016 FINDINGS: Normal heart size and mediastinal contours. There is no edema, consolidation, effusion, or pneumothorax. Subcutaneous implantable cardiac device. IMPRESSION: No evidence of active disease. Electronically Signed   By: Monte Fantasia M.D.   On: 07/10/2021 09:39     Impression / Plan:   Impression: 1.  Sepsis: Confusion, fever, chills and leukocytosis, recent ERCP with stent exchange on 07/06/2021 at [facility], see HPI for details, LFTs are trending down; consider biliary source versus other 2.  Known bile duct cancer: Currently following with [facility], but previously seen by Dr. Rush Landmark (  originally Dr. Fuller Plan patient), recent ERCP is not HPI, plans for oncology  consult per [facility] for chemotherapy prior to possible surgery 3.  History of stroke: Right-sided deficits  Plan: 1.  Agree with antibiotics 2.  Investigate for other sources of infection, confusion 3.  Continue to monitor LFTs and CBC 4.  Patient is okay to eat from a GI standpoint, no plan for procedures today. 5.  Consult IR to consider percutaneous drain of right system.  Thank you for your kind consultation, we will continue to follow.  Lavone Nian [facility]  07/10/2021, 2:48 PM     Attending Physician Note   I have taken a history, reviewed the chart and examined the patient. I personally saw the patient and performed a substantive portion of this encounter, including a complete performance of at least one of the key components, in conjunction with the APP. I agree with the APP's note, impression and recommendations.   Impression / Recommendation:  Cholangiocarcinoma involving the hilum of the liver with left and right intrahepatic obstruction now with fevers, chills, leukocytosis, confusion.  ERCP on 7/19 with 7 fr, 9 cm left hepatic stent placed by Dr. Rush Landmark and ERCP on 8/2 with 10 fr, 12 cm left hepatic stent placed by Dr. Delrae Alfred at [facility]. Both ERCPs unable to access right hepatic system. For mgmt of his cholangiocarcinoma he is now followed at [facility]: Oncology, Hepatobiliary surgery, Dr. Jyl Heinz, and GI, Dr. Jocelyn Lamer.    IV Maxipime Consult IR to consider right hepatic system drainage    Lucio Edward, MD [facility] See [facility], [place] [facility], for our on call provider

## 2021-07-10 NOTE — ED Notes (Signed)
Patient given meal tray.

## 2021-07-10 NOTE — Progress Notes (Signed)
Pharmacy Antibiotic Note  Ruben Chavez is a 70 y.o. male admitted on 07/10/2021 with sepsis, hx biliary stent, fevers began 8/3 > started Cipro as outpatient.  Pharmacy has been consulted for Vancomycin & Cefepime dosing.  Plan: Cefepime 2gm q8 Vanc '1250mg'$  x1 in ED, followed by '1250mg'$  q24 Flagyl '500mg'$  IV q12 per MD, dose & schedule appropriate  Height: '5\' 9"'$  (175.3 cm) Weight: 65.3 kg (144 lb) IBW/kg (Calculated) : 70.7  Temp (24hrs), Avg:98.6 F (37 C), Min:98.6 F (37 C), Max:98.6 F (37 C)  Recent Labs  Lab 07/10/21 1004 07/10/21 1021 07/10/21 1239  WBC 10.8*  --   --   CREATININE 0.80 0.60*  --   LATICACIDVEN 1.3  --  1.8    Estimated Creatinine Clearance: 78.2 mL/min (A) (by C-G formula based on SCr of 0.6 mg/dL (L)).    Allergies  Allergen Reactions   Sulfa Antibiotics Swelling and Other (See Comments)    Swelling in ankles  Swelling in ankles   Advil [Ibuprofen] Other (See Comments)    "Dots on chest" (Petechiae)   Aleve [Naproxen] Other (See Comments)    "Dots on chest" (Petechiae)   Betadine [Povidone Iodine] Itching and Rash   Chloroxylenol (Antiseptic) Itching, Rash and Other (See Comments)    PCMX surgical sterilizing scrub   Poison Ivy Extract Rash   Povidone-Iodine Itching and Rash    BETADINE    Antimicrobials this admission: 8/5 Vancomycin >>  8/5 Cefepime >>  8/5 Flagyl >>  Dose adjustments this admission:  Microbiology results: 8/5 BCx: sent 8/5 UCx: sent   Prev Cx 7/18 BCx: ng-final  Thank you for allowing pharmacy to be a part of this patient's care.  Minda Ditto PharmD 07/10/2021 5:14 PM

## 2021-07-11 ENCOUNTER — Inpatient Hospital Stay (HOSPITAL_COMMUNITY): Payer: PPO

## 2021-07-11 ENCOUNTER — Encounter (HOSPITAL_COMMUNITY): Payer: Self-pay | Admitting: Internal Medicine

## 2021-07-11 LAB — COMPREHENSIVE METABOLIC PANEL
ALT: 86 U/L — ABNORMAL HIGH (ref 0–44)
AST: 73 U/L — ABNORMAL HIGH (ref 15–41)
Albumin: 2.2 g/dL — ABNORMAL LOW (ref 3.5–5.0)
Alkaline Phosphatase: 731 U/L — ABNORMAL HIGH (ref 38–126)
Anion gap: 8 (ref 5–15)
BUN: 13 mg/dL (ref 8–23)
CO2: 22 mmol/L (ref 22–32)
Calcium: 8.5 mg/dL — ABNORMAL LOW (ref 8.9–10.3)
Chloride: 103 mmol/L (ref 98–111)
Creatinine, Ser: 0.67 mg/dL (ref 0.61–1.24)
GFR, Estimated: >60 mL/min (ref >60)
Glucose, Bld: 85 mg/dL (ref 70–99)
Potassium: 3.6 mmol/L (ref 3.5–5.1)
Sodium: 133 mmol/L — ABNORMAL LOW (ref 135–145)
Total Bilirubin: 6 mg/dL — ABNORMAL HIGH (ref 0.3–1.2)
Total Protein: 5.8 g/dL — ABNORMAL LOW (ref 6.5–8.1)

## 2021-07-11 LAB — CBC
HCT: 27 % — ABNORMAL LOW (ref 39.0–52.0)
Hemoglobin: 8.9 g/dL — ABNORMAL LOW (ref 13.0–17.0)
MCH: 31.8 pg (ref 26.0–34.0)
MCHC: 33 g/dL (ref 30.0–36.0)
MCV: 96.4 fL (ref 80.0–100.0)
Platelets: 333 10*3/uL (ref 150–400)
RBC: 2.8 MIL/uL — ABNORMAL LOW (ref 4.22–5.81)
RDW: 17.3 % — ABNORMAL HIGH (ref 11.5–15.5)
WBC: 5.6 10*3/uL (ref 4.0–10.5)
nRBC: 0 % (ref 0.0–0.2)

## 2021-07-11 LAB — PROTIME-INR
INR: 1.1 (ref 0.8–1.2)
Prothrombin Time: 13.7 seconds (ref 11.4–15.2)

## 2021-07-11 LAB — CORTISOL-AM, BLOOD: Cortisol - AM: 11.6 ug/dL (ref 6.7–22.6)

## 2021-07-11 LAB — PROCALCITONIN: Procalcitonin: 1.43 ng/mL

## 2021-07-11 MED ORDER — LACTULOSE 10 GM/15ML PO SOLN
20.0000 g | Freq: Two times a day (BID) | ORAL | Status: DC | PRN
  Administered 2021-07-11: 20 g via ORAL
  Filled 2021-07-11: qty 30

## 2021-07-11 NOTE — Progress Notes (Addendum)
Progress Note   Subjective  Feels better today.    Objective  Vital signs in last 24 hours: Pulse Rate:  [65-87] 72 (08/06 0630) Resp:  [11-20] 20 (08/06 0630) BP: (86-120)/(52-82) 118/64 (08/06 0630) SpO2:  [98 %-100 %] 98 % (08/06 0630) Weight:  [65.3 kg] 65.3 kg (08/05 1012)    General: Alert, well-developed, in NAD Heart:  Regular rate and rhythm; no murmurs Chest: Clear to ascultation bilaterally Abdomen:  Soft, nontender and nondistended. Normal bowel sounds, without guarding, and without rebound.   Extremities:  Without edema. Neurologic:  Alert and  oriented x4; grossly normal neurologically. Psych:  Alert and cooperative. Confused  Intake/Output from previous day: 08/05 0701 - 08/06 0700 In: 2897.8 [IV Piggyback:2897.8] Out: 1000 [Urine:1000] Intake/Output this shift: No intake/output data recorded.  Lab Results: Recent Labs    07/10/21 1004 07/10/21 1021 07/11/21 0340  WBC 10.8*  --  5.6  HGB 9.5* 10.2* 8.9*  HCT 29.2* 30.0* 27.0*  PLT 395  --  333   BMET Recent Labs    07/10/21 1004 07/10/21 1021 07/11/21 0340  NA 131* 133* 133*  K 3.8 3.9 3.6  CL 99 99 103  CO2 24  --  22  GLUCOSE 95 95 85  BUN '15 12 13  '$ CREATININE 0.80 0.60* 0.67  CALCIUM 8.9  --  8.5*   LFT Recent Labs    07/11/21 0340  PROT 5.8*  ALBUMIN 2.2*  AST 73*  ALT 86*  ALKPHOS 731*  BILITOT 6.0*   PT/INR Recent Labs    07/10/21 1647 07/11/21 0340  LABPROT 13.4 13.7  INR 1.0 1.1   Hepatitis Panel No results for input(s): HEPBSAG, HCVAB, HEPAIGM, HEPBIGM in the last 72 hours.  Studies/Results: CT ABDOMEN PELVIS W CONTRAST  Result Date: 07/10/2021 CLINICAL DATA:  Abdominal abscess/infection. Altered mental status. Biliary tumor, likely cholangiocarcinoma. Jaundice. EXAM: CT ABDOMEN AND PELVIS WITH CONTRAST TECHNIQUE: Multidetector CT imaging of the abdomen and pelvis was performed using the standard protocol following bolus administration of intravenous  contrast. CONTRAST:  73m OMNIPAQUE IOHEXOL 350 MG/ML SOLN COMPARISON:  06/11/2021 and MRI from 06/23/2021 FINDINGS: Lower chest: Mild dependent atelectasis in both lower lobes. Descending thoracic aortic atherosclerotic calcification. Hepatobiliary: There has been placement of a biliary stent which extends into the left bile duct. Interval decompression of the left biliary tree although there is continued moderate biliary dilatation in the right hepatic lobe. Indistinct tissue planes along the confluence of the right and left hepatic lobes suspicious for Klatskin tumor. Contracted gallbladder containing high density material compatible with contrast medium. 1.1 by 0.9 cm cyst in segment 2 of the liver. Nonspecific 0.6 cm hypodense lesion in segment 4a. Probable 1.2 by 0.8 cm cyst in segment 4b of the liver on image 28 series 2, versus a dilated biliary element. Nonspecific 0.3 cm hypodense lesion posteriorly in the right hepatic lobe on image 31 series 2. Nonspecific hypodense peripheral lesion in the right hepatic lobe measuring 1.3 by 1.0 cm on image 18 series 2, although previously characterized as a hemangioma. Pancreas: Unremarkable Spleen: Unremarkable Adrenals/Urinary Tract: 5.1 by 4.3 cm Bosniak category 1 cyst of the right kidney lower pole. Distal ureters in urinary bladder obscured by streak artifact from the patient's hip implants. 0.6 cm stone or cluster of stones in the left kidney lower pole on image 69 series 4. Adrenal glands unremarkable. Stomach/Bowel: Prominent stool throughout the colon favors constipation. Vascular/Lymphatic: Aortoiliac atherosclerotic vascular disease. Reproductive: Brachytherapy seeds in the prostate  gland, otherwise obscured by streak artifact from the hip implants. Hypodense lesion in the right scrotum measuring up to 2.9 cm in diameter, possibly an epididymal cyst or spermatocele but not fully characterized on today's exam. Other: No intra-abdominal abscess is identified.  Musculoskeletal: Hernia mesh markers along the lower anterior abdominal wall. Bilateral total hip prostheses, a 1.3 by 0.9 cm lucency along the upper margin of the right acetabular shell component is nonspecific but could conceivably reflect early particulate disease. Slightly exaggerated posterior angulation of the left acetabular shell component. Old healed left posterior rib fractures. Multilevel lumbar spondylosis and degenerative disc disease with resulting lumbar impingement. IMPRESSION: 1. Interval placement of a biliary stent extending from the duodenum into the left biliary system. There has been decompression of the left biliary system but the right hepatic lobe biliary system is still moderately dilated. 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 2. Several small hypodense lesions in the liver are probably cysts or similar benign lesions. One of these has been previously characterized as a hemangioma. Surveillance suggested. 3. No compelling findings of distant metastatic spread. 4.  Prominent stool throughout the colon favors constipation. 5. Other imaging findings of potential clinical significance: Minimal atelectasis in both lower lobes. Aortic Atherosclerosis (ICD10-I70.0). Nonobstructive left nephrolithiasis. Brachytherapy seeds in the prostate gland. Possible right epididymal cyst or spermatocele. Lumbar spondylosis and degenerative disc disease causing multilevel impingement. Electronically Signed   By: Van Clines M.D.   On: 07/10/2021 12:48   DG Chest Port 1 View  Result Date: 07/10/2021 CLINICAL DATA:  Questionable sepsis EXAM: PORTABLE CHEST 1 VIEW COMPARISON:  03/18/2016 FINDINGS: Normal heart size and mediastinal contours. There is no edema, consolidation, effusion, or pneumothorax. Subcutaneous implantable cardiac device. IMPRESSION: No evidence of active disease. Electronically Signed   By: Monte Fantasia M.D.   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 The left system is drained by biliary stent. The right system is not drained and is dilated. He is clinically improving on IV Maxipime. IR consulted and recommends to hold on right system drainage as his LFTs and fever have improved. If he does not continue to improve will reassess the need for right system drainage. Trend LFTs, CBC, fever. Advance diet. His cholangiocarcinoma care has been transferred to [facility] so further follow up and future hospitalizations needed should be with [place] to see again on Monday so please call for questions, problems until then.     LOS: 1 day   Zaylynn Rickett T. Fuller Plan MD 07/11/2021, 10:06 AM See Shea Evans, [facility], to contact our on call provider

## 2021-07-11 NOTE — H&P (Addendum)
Chief Complaint: Patient was seen in consultation today for image guided percutaneous biliary drain placement Chief Complaint  Patient presents with   Fever   at the request of Dr. Marylyn Ishihara, T.   Referring Physician(s): Dr. Marylyn Ishihara, T.  Supervising Physician: Ruthann Cancer  Patient Status: [facility] - ED  History of Present Illness: Ruben Chavez is a 70 y.o. male with PMH of hyperlipidemia, HTN, CVA with right-sided residuals, prostate cancer in 2013, recent diagnosis of bile duct adenocarcinoma s/p stent placement in the left left hepatic duct on 06/24/2021 who presented to [facility] ED with fever, confusion, weakness. Patient was referred to [facility] surgical oncology after the diagnosis of adenocarcinoma, attempt was made to place a stent in the right hepatic duct on 8/2/ 22; however, it was found that the patient had complete obliteration of the right hepatic duct system.  Patient developed fever on 07/08/2021 was prescribed Cipro with no improvement, wife called EMS on 07/10/2021 and the patient was brought to [facility] ED. Patient underwent CT abdomen pelvis with contrast on 07/10/2021 which showed moderate biliary dilation in the right hepatic lobe.  IR was consulted for image guided percutaneous biliary drain placement. Case was reviewed and approved by Dr. Serafina Royals.  Patient was evaluated in ED.  Patient laying in bed, not in acute distress.  States that he is feeling better than yesterday.  Denise headache, fever, chills, shortness of breath, cough, chest pain, abdominal pain, nausea ,vomiting, and bleeding.   Past Medical History:  Diagnosis Date   Elevated PSA    Hyperlipidemia    pt says not anymore   Hypertension    Prostate cancer ([place]) 2013    Past Surgical History:  Procedure Laterality Date   bilateral inguinal hernia  2008   BILIARY BRUSHING  06/23/2021   Procedure: BILIARY BRUSHING;  Surgeon: Irving Copas., MD;  Location: [facility];  Service:  Gastroenterology;;   BILIARY DILATION  06/23/2021   Procedure: BILIARY DILATION;  Surgeon: Irving Copas., MD;  Location: [facility];  Service: Gastroenterology;;   BILIARY STENT PLACEMENT N/A 06/23/2021   Procedure: BILIARY STENT PLACEMENT;  Surgeon: Irving Copas., MD;  Location: [facility];  Service: Gastroenterology;  Laterality: N/A;   BIOPSY  06/23/2021   Procedure: BIOPSY;  Surgeon: Rush Landmark Telford Nab., MD;  Location: [facility];  Service: Gastroenterology;;   Kathleen Argue STUDY  01/28/2020   Procedure: BUBBLE STUDY;  Surgeon: [name], MD;  Location: [place];  Service: Cardiovascular;;   CYST REMOVAL TRUNK  2016   sebaceous cyst   CYSTOSCOPY  04/22/2016   Procedure: CYSTOSCOPY;  Surgeon: [name], MD;  Location: [facility];  Service: Urology;;   ERCP N/A 06/23/2021   Procedure: ENDOSCOPIC RETROGRADE CHOLANGIOPANCREATOGRAPHY (ERCP);  Surgeon: Irving Copas., MD;  Location: [facility];  Service: Gastroenterology;  Laterality: N/A;   IR ANGIO INTRA EXTRACRAN SEL COM CAROTID INNOMINATE BILAT MOD SED  05/30/2020   IR ANGIO VERTEBRAL SEL SUBCLAVIAN INNOMINATE UNI R MOD SED  01/25/2020   IR ANGIO VERTEBRAL SEL VERTEBRAL BILAT MOD SED  05/30/2020   IR CT HEAD LTD  01/25/2020   IR PERCUTANEOUS ART THROMBECTOMY/INFUSION INTRACRANIAL INC DIAG ANGIO  01/25/2020   IR US GUIDE VASC ACCESS RIGHT  05/30/2020   KNEE ARTHROSCOPY  2007   right   LOOP RECORDER INSERTION N/A 01/28/2020   Procedure: LOOP RECORDER INSERTION;  Surgeon: [name], MD;  Location: [place] [facility];  Service: Cardiovascular;  Laterality: N/A;  PANCREATIC STENT PLACEMENT  06/23/2021   Procedure: PANCREATIC STENT PLACEMENT;  Surgeon: Irving Copas., MD;  Location: [facility];  Service: Gastroenterology;;   PROSTATE BIOPSY  2008   PROSTATE BIOPSY  2013   PROSTATE BIOPSY  01/28/2016   RADIOACTIVE SEED IMPLANT N/A 04/22/2016   Procedure: RADIOACTIVE SEED  IMPLANT/BRACHYTHERAPY IMPLANT;  Surgeon: [name], MD;  Location: [facility];  Service: Urology;  Laterality: N/A;   RADIOLOGY WITH ANESTHESIA N/A 01/24/2020   Procedure: IR WITH ANESTHESIA;  Surgeon: [name], MD;  Location: [place];  Service: Radiology;  Laterality: N/A;   REMOVAL OF STONES  06/23/2021   Procedure: REMOVAL OF STONES;  Surgeon: Rush Landmark Telford Nab., MD;  Location: [facility];  Service: Gastroenterology;;   Joan Mayans  06/23/2021   Procedure: Joan Mayans;  Surgeon: Rush Landmark Telford Nab., MD;  Location: [facility];  Service: Gastroenterology;;   TEE WITHOUT CARDIOVERSION N/A 01/28/2020   Procedure: TRANSESOPHAGEAL ECHOCARDIOGRAM (TEE);  Surgeon: [name], MD;  Location: [facility];  Service: Cardiovascular;  Laterality: N/A;   TONSILLECTOMY     TOTAL HIP ARTHROPLASTY  2001   left   TOTAL HIP ARTHROPLASTY Right 01/18/2018   Procedure: RIGHT TOTAL HIP ARTHROPLASTY ANTERIOR APPROACH;  Surgeon: [name], MD;  Location: [facility];  Service: Orthopedics;  Laterality: Right;    Allergies: Sulfa antibiotics, Advil [ibuprofen], Aleve [naproxen], Betadine [povidone iodine], Chloroxylenol (antiseptic), Poison ivy extract, and Povidone-iodine  Medications: Prior to Admission medications   Medication Sig Start Date End Date Taking? Authorizing Provider  aspirin EC 325 MG tablet Take 325 mg by mouth daily.   Yes [provider]  buPROPion (WELLBUTRIN XL) 300 MG 24 hr tablet TAKE 1 TABLET(300 MG) BY MOUTH DAILY 02/05/21  Yes Isaac Bliss, Rayford Halsted, MD  ciprofloxacin (CIPRO) 500 MG tablet Take 500 mg by mouth 2 (two) times daily. 07/09/21  Yes [provider]  folic acid (FOLVITE) 1 MG tablet TAKE 1 TABLET(1 MG) BY MOUTH DAILY 02/05/21  Yes Isaac Bliss, Rayford Halsted, MD  lisinopril-hydrochlorothiazide (ZESTORETIC) 20-25 MG tablet TAKE 1 TABLET BY MOUTH DAILY 05/06/21  Yes Isaac Bliss, Rayford Halsted, MD  Magnesium 250 MG  TABS Take 250 mg by mouth every morning.   Yes [provider]  Multiple Vitamin (MULTIVITAMIN WITH MINERALS) TABS tablet Take 1 tablet by mouth daily. 01/29/20  Yes [name], NP  pantoprazole (PROTONIX) 40 MG tablet Take 1 tablet (40 mg total) by mouth daily. 06/04/21  Yes Isaac Bliss, Rayford Halsted, MD  polyethylene glycol (MIRALAX / GLYCOLAX) 17 g packet Take 17 g by mouth daily as needed (constipation).   Yes [provider]  traZODone (DESYREL) 100 MG tablet TAKE 1 TABLET(100 MG) BY MOUTH AT BEDTIME AS NEEDED FOR SLEEP Patient taking differently: Take 100 mg by mouth at bedtime. 03/03/21  Yes Isaac Bliss, Rayford Halsted, MD  aspirin EC 81 MG EC tablet Take 1 tablet (81 mg total) by mouth daily. Swallow whole. Patient not taking: No sig reported 06/27/21   Shawna Clamp, MD     Family History  Problem Relation Age of Onset   Pancreatic cancer Mother        small bowel cancer   Heart disease Father    Colon cancer Neg Hx    Stomach cancer Neg Hx     Social History   Socioeconomic History   Marital status: Married    Spouse name: [name]   Number of children: Not on file   Years of education:  Not on file   Highest education level: Not on file  Occupational History   Not on file  Tobacco Use   Smoking status: Former    Packs/day: 1.00    Years: 32.00    Pack years: 32.00    Types: Cigarettes    Quit date: 06/06/1999    Years since quitting: 22.1   Smokeless tobacco: Never  Vaping Use   Vaping Use: Never used  Substance and Sexual Activity   Alcohol use: Not Currently    Alcohol/week: 14.0 standard drinks    Types: 14 Shots of liquor per week   Drug use: No   Sexual activity: Yes  Other Topics Concern   Not on file  Social History Narrative   Not on file   Social Determinants of Health   Financial Resource Strain: Not on file  Food Insecurity: Not on file  Transportation Needs: Not on file  Physical Activity: Not on file  Stress: Not on file   Social Connections: Not on file     Review of Systems: A 12 point ROS discussed and pertinent positives are indicated in the HPI above.  All other systems are negative.   Vital Signs: BP 118/64   Pulse 72   Temp 98.6 F (37 C)   Resp 20   Ht '5\' 9"'$  (1.753 m)   Wt 144 lb (65.3 kg)   SpO2 98%   BMI 21.27 kg/m   Physical Exam Vitals reviewed.  Constitutional:      General: He is not in acute distress.    Appearance: Normal appearance. He is not ill-appearing.  HENT:     Head: Normocephalic and atraumatic.  Cardiovascular:     Rate and Rhythm: Normal rate and regular rhythm.     Pulses: Normal pulses.     Heart sounds: Normal heart sounds.  Pulmonary:     Effort: Pulmonary effort is normal.     Breath sounds: Normal breath sounds.  Abdominal:     General: Abdomen is flat. Bowel sounds are normal.     Palpations: Abdomen is soft.  Musculoskeletal:     Cervical back: Neck supple.  Skin:    General: Skin is warm.     Comments: Scleral icterus   Neurological:     Mental Status: He is alert and oriented to person, place, and time.  Psychiatric:        Mood and Affect: Mood normal.        Behavior: Behavior normal.        Judgment: Judgment normal.    MD Evaluation Airway: WNL Heart: WNL Abdomen: WNL Chest/ Lungs: WNL ASA  Classification: 3 Mallampati/Airway Score: One  Imaging: CT ABDOMEN PELVIS W WO CONTRAST  Result Date: 06/11/2021 CLINICAL DATA:  Weight loss, anorexia and jaundice. Evaluate for malignancy. EXAM: CT ABDOMEN AND PELVIS WITHOUT AND WITH CONTRAST TECHNIQUE: Multidetector CT imaging of the abdomen and pelvis was performed following the standard protocol before and following the bolus administration of intravenous contrast. CONTRAST:  181m OMNIPAQUE IOHEXOL 300 MG/ML  SOLN COMPARISON:  Renal scratch set CT AP 11/14/2019. FINDINGS: Lower chest: No acute abnormality. Hepatobiliary: There is moderate to marked intrahepatic bile duct dilatation. Dilated  intrahepatic bile ducts converge centrally. The common bile duct is nondilated. At the convergence of the dilated bile ducts there is focal luminal narrowing of the portal vein, image 50/12. Poorly defined (and difficult to measure) infiltrative soft tissue within the convergence of the intrahepatic bile ducts measures approximately 2.2 x  1.6 cm, image 30/10 and image 47/12. The 2 fluid attenuating structures are noted within segment 4 and segment 2 which are favored to represent small cysts. The gallbladder appears collapsed. Pancreas: No signs of pancreatic mass, inflammation or main duct dilatation. Spleen: Normal in size without focal abnormality. Adrenals/Urinary Tract: Normal appearance of the adrenal glands. 4 mm nonobstructing stone identified within inferior pole of the right kidney. Simple appearing cyst arises off the inferior pole of the right kidney measuring 5.1 cm, image 49/7. No hydronephrosis identified bilaterally. Urinary bladder is largely obscured by beam hardening artifact from patient's arthroplasty devices. Stomach/Bowel: Stomach appears normal. The appendix is visualized and is within normal limits. No bowel wall thickening, inflammation, or distension. Vascular/Lymphatic: Aortic atherosclerosis. No aneurysm. No adenopathy identified within the abdomen or pelvis. Reproductive: Seed implants noted within the prostate gland. Other: No free fluid or fluid collections. Musculoskeletal: There is no acute or suspicious osseous findings. The multilevel degenerative disc disease identified within the lumbar spine. IMPRESSION: 1. There is moderate to marked intrahepatic bile duct dilatation with transition to normal caliber common bile duct. There is a ill-defined, infiltrative soft tissue within the convergence of the dilated intrahepatic bile ducts. The diagnosis of exclusion is cholangiocarcinoma. More definitive characterization with contrast enhanced MRI/MRCP is recommended. 2. No evidence for  nodal metastasis or solid organ metastasis within the abdomen or pelvis. 3. Nonobstructing right renal calculus. 4. Aortic atherosclerosis. Aortic Atherosclerosis (ICD10-I70.0). These results will be called to the ordering clinician or representative by the Radiologist Assistant, and communication documented in the PACS or [employer]. Electronically Signed   By: Kerby Moors M.D.   On: 06/11/2021 16:13   CT CHEST W CONTRAST  Result Date: 06/25/2021 CLINICAL DATA:  Pancreatic cancer, staging EXAM: CT CHEST WITH CONTRAST TECHNIQUE: Multidetector CT imaging of the chest was performed during intravenous contrast administration. CONTRAST:  81m OMNIPAQUE IOHEXOL 350 MG/ML SOLN COMPARISON:  06/11/2021 FINDINGS: Cardiovascular: The heart and great vessels are unremarkable without pericardial effusion. Ectasia of the ascending thoracic aorta measuring up to 3.9 cm, without aneurysm or dissection. Moderate atherosclerosis of the aortic arch and coronary vasculature. Mediastinum/Nodes: No enlarged mediastinal, hilar, or axillary lymph nodes. Thyroid gland, trachea, and esophagus demonstrate no significant findings. Lungs/Pleura: No acute airspace disease, effusion, or pneumothorax. Minimal hypoventilatory changes within the dependent lower lobes. Central airways are patent. 8 x 6 x 7 mm nodule within the superior segment left lower lobe reference image 65/5. No other pulmonary nodules or masses. Upper Abdomen: Interval biliary duct stent placement. Persistent intrahepatic biliary duct dilation, with pneumobilia compatible with recent intervention. Please refer to MRI report describing obstructing mass at the porta hepatis. Musculoskeletal: No acute or destructive bony lesions. Loop recorder left anterior chest wall. Reconstructed images demonstrate no additional findings. IMPRESSION: 1. Indeterminate left lower lobe pulmonary nodule with mean diameter of 7 mm. Metastatic disease cannot be excluded, and close  attention on follow-up is recommended. 2. Ectasia of the ascending thoracic aorta without frank aneurysm, maximal diameter 3.9 cm. 3. Interval placement of a common bile duct stent, with pneumobilia and persistent intrahepatic duct dilation as above. 4.  Aortic Atherosclerosis (ICD10-I70.0). Electronically Signed   By: MRanda NgoM.D.   On: 06/25/2021 20:11   CT ABDOMEN PELVIS W CONTRAST  Result Date: 07/10/2021 CLINICAL DATA:  Abdominal abscess/infection. Altered mental status. Biliary tumor, likely cholangiocarcinoma. Jaundice. EXAM: CT ABDOMEN AND PELVIS WITH CONTRAST TECHNIQUE: Multidetector CT imaging of the abdomen and pelvis was performed using the standard protocol  following bolus administration of intravenous contrast. CONTRAST:  90m OMNIPAQUE IOHEXOL 350 MG/ML SOLN COMPARISON:  06/11/2021 and MRI from 06/23/2021 FINDINGS: Lower chest: Mild dependent atelectasis in both lower lobes. Descending thoracic aortic atherosclerotic calcification. Hepatobiliary: There has been placement of a biliary stent which extends into the left bile duct. Interval decompression of the left biliary tree although there is continued moderate biliary dilatation in the right hepatic lobe. Indistinct tissue planes along the confluence of the right and left hepatic lobes suspicious for Klatskin tumor. Contracted gallbladder containing high density material compatible with contrast medium. 1.1 by 0.9 cm cyst in segment 2 of the liver. Nonspecific 0.6 cm hypodense lesion in segment 4a. Probable 1.2 by 0.8 cm cyst in segment 4b of the liver on image 28 series 2, versus a dilated biliary element. Nonspecific 0.3 cm hypodense lesion posteriorly in the right hepatic lobe on image 31 series 2. Nonspecific hypodense peripheral lesion in the right hepatic lobe measuring 1.3 by 1.0 cm on image 18 series 2, although previously characterized as a hemangioma. Pancreas: Unremarkable Spleen: Unremarkable Adrenals/Urinary Tract: 5.1 by 4.3  cm Bosniak category 1 cyst of the right kidney lower pole. Distal ureters in urinary bladder obscured by streak artifact from the patient's hip implants. 0.6 cm stone or cluster of stones in the left kidney lower pole on image 69 series 4. Adrenal glands unremarkable. Stomach/Bowel: Prominent stool throughout the colon favors constipation. Vascular/Lymphatic: Aortoiliac atherosclerotic vascular disease. Reproductive: Brachytherapy seeds in the prostate gland, otherwise obscured by streak artifact from the hip implants. Hypodense lesion in the right scrotum measuring up to 2.9 cm in diameter, possibly an epididymal cyst or spermatocele but not fully characterized on today's exam. Other: No intra-abdominal abscess is identified. Musculoskeletal: Hernia mesh markers along the lower anterior abdominal wall. Bilateral total hip prostheses, a 1.3 by 0.9 cm lucency along the upper margin of the right acetabular shell component is nonspecific but could conceivably reflect early particulate disease. Slightly exaggerated posterior angulation of the left acetabular shell component. Old healed left posterior rib fractures. Multilevel lumbar spondylosis and degenerative disc disease with resulting lumbar impingement. IMPRESSION: 1. Interval placement of a biliary stent extending from the duodenum into the left biliary system. There has been decompression of the left biliary system but the right hepatic lobe biliary system is still moderately dilated. 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 2. Several small hypodense lesions in the liver are probably cysts or similar benign lesions. One of these has been previously characterized as a hemangioma. Surveillance suggested. 3. No compelling findings of distant metastatic spread. 4.  Prominent stool throughout the colon favors constipation. 5. Other imaging findings of potential clinical significance: Minimal atelectasis in both lower lobes. Aortic Atherosclerosis  (ICD10-I70.0). Nonobstructive left nephrolithiasis. Brachytherapy seeds in the prostate gland. Possible right epididymal cyst or spermatocele. Lumbar spondylosis and degenerative disc disease causing multilevel impingement. Electronically Signed   By: WVan ClinesM.D.   On: 07/10/2021 12:48   MR 3D Recon At Scanner  Result Date: 06/23/2021 CLINICAL DATA:  Obstructive jaundice and leukocytosis. Intrahepatic biliary ductal dilatation with concern for centrally obstructing biliary mass on recent CT. Planned for ERCP this evening. EXAM: MRI ABDOMEN WITHOUT AND WITH CONTRAST (INCLUDING MRCP) TECHNIQUE: Multiplanar multisequence MR imaging of the abdomen was performed both before and after the administration of intravenous contrast. Heavily T2-weighted images of the biliary and pancreatic ducts were obtained, and three-dimensional MRCP images were rendered by post processing. CONTRAST:  785mGADAVIST GADOBUTROL 1  MMOL/ML IV SOLN COMPARISON:  06/11/2021 CT abdomen/pelvis. FINDINGS: Lower chest: No acute abnormality at the lung bases. Hepatobiliary: Normal liver size and configuration. No hepatic steatosis. There is a 1.0 cm peripheral right liver dome hemangioma (series 26/image 26) with progressive discontinuous peripheral nodular enhancement. A few small lobulated and otherwise simple liver cysts are scattered in anterior left liver, largest 1.3 cm in the segment 4 left liver (series 5/image 18). There is marked diffuse intrahepatic biliary ductal dilatation with abrupt caliber transition at the biliary hilum. Suggestion of a poorly marginated enhancing obstructing 2.1 x 1.4 cm soft tissue mass at the biliary hilum (series 5/image 16) with associated mildly restricted diffusion on series 3/image 52. Common bile duct diameter 3 mm. No choledocholithiasis. Gallbladder is contracted with no gallstones. No definite gallbladder wall thickening accounting for contracted state. Pancreas: No pancreatic mass or duct  dilation.  No pancreas divisum. Spleen: Normal size. No mass. Adrenals/Urinary Tract: Normal adrenals. No hydronephrosis. Scattered simple bilateral renal cortical cysts, largest 4.6 cm in the posterior lower right kidney. No suspicious renal masses. Stomach/Bowel: Normal non-distended stomach. Visualized small and large bowel is normal caliber, with no bowel wall thickening. Vascular/Lymphatic: Atherosclerotic nonaneurysmal abdominal aorta. Patent portal, splenic, hepatic and renal veins. Mildly enlarged 1.4 cm short axis diameter additional porta hepatis node (series 5/image 19). No pathologically enlarged lymph nodes in the abdomen. Other: No abdominal ascites or focal fluid collection. Musculoskeletal: No aggressive appearing focal osseous lesions. IMPRESSION: 1. Marked diffuse intrahepatic biliary ductal dilatation with abrupt caliber transition at the biliary hilum. Suggestion of a poorly marginated enhancing 2.1 x 1.4 cm soft tissue mass at the biliary hilum with associated mildly restricted diffusion. 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 ERCP with brush biopsy correlation already planned by report. 2. Mild porta hepatis lymphadenopathy, indeterminate for metastatic disease. Electronically Signed   By: Ilona Sorrel M.D.   On: 06/23/2021 10:48   DG Chest Port 1 View  Result Date: 07/10/2021 CLINICAL DATA:  Questionable sepsis EXAM: PORTABLE CHEST 1 VIEW COMPARISON:  03/18/2016 FINDINGS: Normal heart size and mediastinal contours. There is no edema, consolidation, effusion, or pneumothorax. Subcutaneous implantable cardiac device. IMPRESSION: No evidence of active disease. Electronically Signed   By: Monte Fantasia M.D.   On: 07/10/2021 09:39   DG ERCP BILIARY & PANCREATIC DUCTS  Result Date: 06/24/2021 CLINICAL DATA:  Obstructive jaundice, leukocytosis and possible Klatskin tumor. EXAM: ERCP TECHNIQUE: Multiple spot images obtained with the fluoroscopic  device and submitted for interpretation post-procedure. FLUOROSCOPY TIME:  Fluoroscopy Time:  11 minutes 3 seconds Number of Acquired Spot Images: 0 COMPARISON:  None. FINDINGS: A total of 27 spot images are submitted for review. The images demonstrate a flexible duodenal scope in the descending duodenum with wire cannulation of both the pancreatic and common bile duct. Cholangiogram is performed. There is no significant dilation of the common bile duct. The cystic duct is patent with contrast material entering the gallbladder lumen. Diffuse dilation of the intrahepatic ducts present. On the final images, both pancreatic and biliary duct plastic stents have been placed. IMPRESSION: 1. Intrahepatic biliary ductal dilatation without evidence of common bile duct dilation suggesting obstruction at the level of the hepatic confluence. 2. Placement of plastic pancreatic and biliary stents. These images were submitted for radiologic interpretation only. Please see the procedural report for the amount of contrast and the fluoroscopy time utilized. Electronically Signed   By: Jacqulynn Cadet M.D.   On: 06/24/2021 07:57   MR  ABDOMEN MRCP W WO CONTAST  Result Date: 06/23/2021 CLINICAL DATA:  Obstructive jaundice and leukocytosis. Intrahepatic biliary ductal dilatation with concern for centrally obstructing biliary mass on recent CT. Planned for ERCP this evening. EXAM: MRI ABDOMEN WITHOUT AND WITH CONTRAST (INCLUDING MRCP) TECHNIQUE: Multiplanar multisequence MR imaging of the abdomen was performed both before and after the administration of intravenous contrast. Heavily T2-weighted images of the biliary and pancreatic ducts were obtained, and three-dimensional MRCP images were rendered by post processing. CONTRAST:  98m GADAVIST GADOBUTROL 1 MMOL/ML IV SOLN COMPARISON:  06/11/2021 CT abdomen/pelvis. FINDINGS: Lower chest: No acute abnormality at the lung bases. Hepatobiliary: Normal liver size and configuration. No  hepatic steatosis. There is a 1.0 cm peripheral right liver dome hemangioma (series 26/image 26) with progressive discontinuous peripheral nodular enhancement. A few small lobulated and otherwise simple liver cysts are scattered in anterior left liver, largest 1.3 cm in the segment 4 left liver (series 5/image 18). There is marked diffuse intrahepatic biliary ductal dilatation with abrupt caliber transition at the biliary hilum. Suggestion of a poorly marginated enhancing obstructing 2.1 x 1.4 cm soft tissue mass at the biliary hilum (series 5/image 16) with associated mildly restricted diffusion on series 3/image 52. Common bile duct diameter 3 mm. No choledocholithiasis. Gallbladder is contracted with no gallstones. No definite gallbladder wall thickening accounting for contracted state. Pancreas: No pancreatic mass or duct dilation.  No pancreas divisum. Spleen: Normal size. No mass. Adrenals/Urinary Tract: Normal adrenals. No hydronephrosis. Scattered simple bilateral renal cortical cysts, largest 4.6 cm in the posterior lower right kidney. No suspicious renal masses. Stomach/Bowel: Normal non-distended stomach. Visualized small and large bowel is normal caliber, with no bowel wall thickening. Vascular/Lymphatic: Atherosclerotic nonaneurysmal abdominal aorta. Patent portal, splenic, hepatic and renal veins. Mildly enlarged 1.4 cm short axis diameter additional porta hepatis node (series 5/image 19). No pathologically enlarged lymph nodes in the abdomen. Other: No abdominal ascites or focal fluid collection. Musculoskeletal: No aggressive appearing focal osseous lesions. IMPRESSION: 1. Marked diffuse intrahepatic biliary ductal dilatation with abrupt caliber transition at the biliary hilum. Suggestion of a poorly marginated enhancing 2.1 x 1.4 cm soft tissue mass at the biliary hilum with associated mildly restricted diffusion. 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  ERCP with brush biopsy correlation already planned by report. 2. Mild porta hepatis lymphadenopathy, indeterminate for metastatic disease. Electronically Signed   By: JIlona SorrelM.D.   On: 06/23/2021 10:48   CUP PACEART REMOTE DEVICE CHECK  Result Date: 06/24/2021 ILR summary report received. Battery status OK. Normal device function. No new symptom, brady, or pause episodes. No new AF episodes. One previously viewed and reviewed tachycardia episode.  Monthly summary reports and ROV/PRN MKathy Breach RN, CCDS, [employer]   Labs:  CBC: Recent Labs    06/25/21 0541 06/26/21 0444 07/10/21 1004 07/10/21 1021 07/11/21 0340  WBC 17.4* 13.2* 10.8*  --  5.6  HGB 10.2* 9.0* 9.5* 10.2* 8.9*  HCT 31.2* 27.2* 29.2* 30.0* 27.0*  PLT 447* 414* 395  --  333    COAGS: Recent Labs    06/26/21 0444 07/10/21 1004 07/10/21 1647 07/11/21 0340  INR 1.1 1.0 1.0 1.1  APTT  --  34  --   --     BMP: Recent Labs    06/25/21 0541 06/26/21 0444 07/01/21 1458 07/10/21 1004 07/10/21 1021 07/11/21 0340  NA 131* 133* 125* 131* 133* 133*  K 3.8 3.4* 3.4* 3.8 3.9 3.6  CL 100  101 93* 99 99 103  CO2 20* 21* 25 24  --  22  GLUCOSE 101* 94 108* 95 95 85  BUN 24* '21 10 15 12 13  '$ CALCIUM 9.0 8.8* 8.6 8.9  --  8.5*  CREATININE 0.60* 0.52* 0.66 0.80 0.60* 0.67  GFRNONAA >60 >60  --  >60  --  >60    LIVER FUNCTION TESTS: Recent Labs    06/26/21 0444 07/01/21 1458 07/10/21 1004 07/11/21 0340  BILITOT 12.8* 15.7* 7.5* 6.0*  AST 142* 193* 94* 73*  ALT 195* 195* 113* 86*  ALKPHOS 1,253* 1,677* 845* 731*  PROT 6.0* 6.3 6.6 5.8*  ALBUMIN 2.0* 2.9* 2.5* 2.2*    TUMOR MARKERS: No results for input(s): AFPTM, CEA, CA199, CHROMGRNA in the last 8760 hours.  Assessment and Plan: 70 y.o. 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 Patient underwent CT abdomen pelvis with contrast on 07/10/2021 which showed moderate biliary dilation in the right hepatic lobe.  IR was consulted for image guided percutaneous biliary drain placement. Case was reviewed and approved by Dr. Serafina Royals, however,  labs this morning revealed no leukocytosis, improving LFT with trending down T bili from 7.5 to 6.0.  No objective fever documented since the time of the presentation to the ED.  Decision was made to HOLD the procedure as the patient condition is improving, the risk of the procedure may outweigh the benefit at this point.  After through discussion and shared decision making with the patient and his wife (via telephone,) both agree to put the biliary drain placement with IR on hold.   Please call IR radiologist on call for questions and concerns over the weekend.  Sent secure chat to attending provider Dr. Sloan Leiter, informed him that IR is not planning on doing a procedure for Mr. Arcia and asked him to d/c NPO order at his discretion.   Thank you for this interesting consult.  I greatly enjoyed meeting [employer] and look forward to participating in their care.  A copy of this report was sent to the requesting provider on this date.  Electronically Signed: Tera Mater, PA-C 07/11/2021, 9:10 AM   I spent a total of  30 Minutes   in face to face in clinical consultation, greater than 50% of which was counseling/coordinating care for PBD placement

## 2021-07-11 NOTE — ED Notes (Signed)
Pt taken to IT by IR tech

## 2021-07-11 NOTE — Plan of Care (Signed)

## 2021-07-11 NOTE — ED Notes (Signed)
Patient turned to right side. Patient cleaned with new primofit applied.

## 2021-07-11 NOTE — ED Notes (Signed)
Patient frustrated because he said no one has been in his room in 30 minutes. RN told patient we are doing the best we can and trying to work as fast as possible.

## 2021-07-11 NOTE — Progress Notes (Signed)
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   Assessment & Plan:   Active Problems:   SIRS (systemic inflammatory response syndrome) (HCC)  SIRS: Possible source biliary duct.  Blood cultures and urine cultures pending.  Clinically stabilizing.  Treated with vancomycin and Flagyl and cefepime.  We will continue until clinical improvement or 48 hours of initiation of therapy.  Acute metabolic encephalopathy: Secondary to above.  Mental status improved.  Adenocarcinoma of the common hepatic duct: Biliary obstruction.  Followed by gastroenterology and IR.  With some improvement of bilirubin today, holding off on percutaneous biliary drain. Daily LFTs and electrolytes. Patient to be followed at [facility] for treatment of adenocarcinoma.  Hypertension: Risk of hypotension.  Holding home medications.  Depression: Resume Wellbutrin.     DVT prophylaxis: enoxaparin (LOVENOX) injection 40 mg Start: 07/10/21 1800   Code Status: Full code Family Communication: None Disposition Plan: Status is: Inpatient  Remains inpatient appropriate because:Inpatient level of care appropriate due to severity of illness  Dispo: The patient is from: Home              Anticipated d/c is to: Home              Patient currently is not medically stable to d/c.   Difficult to place patient No         Consultants:  Gastroenterology Interventional radiology  Procedures:  None  Antimicrobials:  Vancomycin, Flagyl and cefepime 8/5---   Subjective: Patient  seen in the morning rounds.  He was still in the emergency room.  Alert and awake and was able to verbalize his conversation with radiology provider.  He tells me that IR doctor called his wife and explained to them about waiting and seeing for any improvement.  Denies any nausea vomiting or abdominal pain.  He was eager to eat.  Objective: Vitals:   07/11/21 0400 07/11/21 0600 07/11/21 0630 07/11/21 1020  BP: 114/65 120/64 118/64 117/86  Pulse: 70 66 72 71  Resp: '11 11 20 18  '$ Temp:    98.2 F (36.8 C)  TempSrc:    Tympanic  SpO2: 100% 100% 98% 99%  Weight:      Height:        Intake/Output Summary (Last 24 hours) at 07/11/2021 1541 Last data filed at 07/11/2021 1500 Gross per 24 hour  Intake 2542.31 ml  Output 1000 ml  Net 1542.31 ml   Filed Weights   07/10/21 0848 07/10/21 1012  Weight: 67 kg 65.3 kg    Examination:  General exam: Appears calm and comfortable  Frail and debilitated.  Icteric. Respiratory system: Clear to auscultation. Respiratory effort normal.  No added sounds. Cardiovascular system: S1 & S2 heard, RRR.  Gastrointestinal system: Soft and nontender.  Bowel sounds present. Central nervous system: Alert and oriented.  Patient has right-sided weakness more than left.   Data Reviewed: I have personally reviewed following labs and imaging studies  CBC: Recent Labs  Lab 07/10/21 1004 07/10/21 1021 07/11/21 0340  WBC 10.8*  --  5.6  NEUTROABS 9.8*  --   --   HGB  9.5* 10.2* 8.9*  HCT 29.2* 30.0* 27.0*  MCV 95.1  --  96.4  PLT 395  --  0000000   Basic Metabolic Panel: Recent Labs  Lab 07/10/21 1004 07/10/21 1021 07/11/21 0340  NA 131* 133* 133*  K 3.8 3.9 3.6  CL 99 99 103  CO2 24  --  22  GLUCOSE 95 95 85  BUN '15 12 13  '$ CREATININE 0.80 0.60* 0.67  CALCIUM 8.9  --  8.5*   GFR: Estimated Creatinine Clearance: 78.2 mL/min (by C-G formula based on SCr of 0.67 mg/dL). Liver Function Tests: Recent Labs  Lab 07/10/21 1004 07/11/21 0340  AST 94*  73*  ALT 113* 86*  ALKPHOS 845* 731*  BILITOT 7.5* 6.0*  PROT 6.6 5.8*  ALBUMIN 2.5* 2.2*   Recent Labs  Lab 07/10/21 1004  LIPASE 27   Recent Labs  Lab 07/10/21 1004  AMMONIA 50*   Coagulation Profile: Recent Labs  Lab 07/10/21 1004 07/10/21 1647 07/11/21 0340  INR 1.0 1.0 1.1   Cardiac Enzymes: No results for input(s): CKTOTAL, CKMB, CKMBINDEX, TROPONINI in the last 168 hours. BNP (last 3 results) No results for input(s): PROBNP in the last 8760 hours. HbA1C: No results for input(s): HGBA1C in the last 72 hours. CBG: No results for input(s): GLUCAP in the last 168 hours. Lipid Profile: No results for input(s): CHOL, HDL, LDLCALC, TRIG, CHOLHDL, LDLDIRECT in the last 72 hours. Thyroid Function Tests: No results for input(s): TSH, T4TOTAL, FREET4, T3FREE, THYROIDAB in the last 72 hours. Anemia Panel: No results for input(s): VITAMINB12, FOLATE, FERRITIN, TIBC, IRON, RETICCTPCT in the last 72 hours. Sepsis Labs: Recent Labs  Lab 07/10/21 1004 07/10/21 1239 07/11/21 0340  PROCALCITON  --   --  1.43  LATICACIDVEN 1.3 1.8  --     Recent Results (from the past 240 hour(s))  Resp Panel by RT-PCR (Flu A&B, Covid) Nasopharyngeal Swab     Status: None   Collection Time: 07/10/21  9:11 AM   Specimen: Nasopharyngeal Swab; Nasopharyngeal(NP) swabs in vial transport medium  Result Value Ref Range Status   SARS Coronavirus 2 by RT PCR NEGATIVE NEGATIVE Final    Comment: (NOTE) SARS-CoV-2 target nucleic acids are NOT DETECTED.  The SARS-CoV-2 RNA is generally detectable in upper respiratory specimens during the acute phase of infection. The lowest concentration of SARS-CoV-2 viral copies this assay can detect is 138 copies/mL. A negative result does not preclude SARS-Cov-2 infection and should not be used as the sole basis for treatment or other patient management decisions. A negative result may occur with  improper specimen collection/handling, submission of  specimen other than nasopharyngeal swab, presence of viral mutation(s) within the areas targeted by this assay, and inadequate number of viral copies(<138 copies/mL). A negative result must be combined with clinical observations, patient history, and epidemiological information. The expected result is Negative.  Fact Sheet for Patients:  EntrepreneurPulse.com.au  Fact Sheet for Healthcare Providers:  IncredibleEmployment.be  This test is no t yet approved or cleared by the [place] FDA and  has been authorized for detection and/or diagnosis of SARS-CoV-2 by FDA under an Emergency Use Authorization (EUA). This EUA will remain  in effect (meaning this test can be used) for the duration of the COVID-19 declaration under Section 564(b)(1) of the Act, 21 U.S.C.section 360bbb-3(b)(1), unless the authorization is terminated  or revoked sooner.       Influenza A by PCR NEGATIVE NEGATIVE Final   Influenza B by PCR NEGATIVE NEGATIVE Final  Comment: (NOTE) The Xpert Xpress SARS-CoV-2/FLU/RSV plus assay is intended as an aid in the diagnosis of influenza from Nasopharyngeal swab specimens and should not be used as a sole basis for treatment. Nasal washings and aspirates are unacceptable for Xpert Xpress SARS-CoV-2/FLU/RSV testing.  Fact Sheet for Patients: EntrepreneurPulse.com.au  Fact Sheet for Healthcare Providers: IncredibleEmployment.be  This test is not yet approved or cleared by the [place] FDA and has been authorized for detection and/or diagnosis of SARS-CoV-2 by FDA under an Emergency Use Authorization (EUA). This EUA will remain in effect (meaning this test can be used) for the duration of the COVID-19 declaration under Section 564(b)(1) of the Act, 21 U.S.C. section 360bbb-3(b)(1), unless the authorization is terminated or revoked.  Performed at [facility], [place]  [address] [place], [place] [address]   Blood Culture (routine x 2)     Status: None (Preliminary result)   Collection Time: 07/10/21  9:55 AM   Specimen: BLOOD RIGHT WRIST  Result Value Ref Range Status   Specimen Description   Final    BLOOD RIGHT WRIST Performed at [facility], [address] [place], [place] [address]    Special Requests   Final    BOTTLES DRAWN AEROBIC AND ANAEROBIC Blood Culture results may not be optimal due to an inadequate volume of blood received in culture bottles Performed at [place] [address] [place], [place] [address]    Culture   Final    NO GROWTH 1 DAY Performed at [facility], [place] [address], [place], [place] [address]    Report Status PENDING  Incomplete  Blood Culture (routine x 2)     Status: None (Preliminary result)   Collection Time: 07/10/21 10:05 AM   Specimen: BLOOD  Result Value Ref Range Status   Specimen Description   Final    BLOOD RIGHT ANTECUBITAL Performed at [facility] [address], [employer] [place], [place] [address]    Special Requests   Final    BOTTLES DRAWN AEROBIC AND ANAEROBIC Blood Culture results may not be optimal due to an inadequate volume of blood received in culture bottles Performed at [place] [address], [place], [place] [address]    Culture   Final    NO GROWTH 1 DAY Performed at [facility], [place] [address] [place], [place] [address]    Report Status PENDING  Incomplete         Radiology Studies: CT ABDOMEN PELVIS W CONTRAST  Result Date: 07/10/2021 CLINICAL DATA:  Abdominal abscess/infection. Altered mental status. Biliary tumor, likely cholangiocarcinoma. Jaundice. EXAM: CT ABDOMEN AND PELVIS WITH CONTRAST TECHNIQUE: Multidetector CT imaging of the abdomen and pelvis was performed using the standard protocol following bolus administration of intravenous contrast. CONTRAST:  69m OMNIPAQUE IOHEXOL 350 MG/ML  SOLN COMPARISON:  06/11/2021 and MRI from 06/23/2021 FINDINGS: Lower chest: Mild dependent atelectasis in both lower lobes. Descending thoracic aortic atherosclerotic calcification. Hepatobiliary: There has been placement of a biliary stent which extends into the left bile duct. Interval decompression of the left biliary tree although there is continued moderate biliary dilatation in the right hepatic lobe. Indistinct tissue planes along the confluence of the right and left hepatic lobes suspicious for Klatskin tumor. Contracted gallbladder containing high density material compatible with contrast medium. 1.1 by 0.9 cm cyst in segment 2 of the liver. Nonspecific 0.6 cm hypodense lesion in segment 4a. Probable 1.2 by 0.8 cm cyst  in segment 4b of the liver on image 28 series 2, versus a dilated biliary element. Nonspecific 0.3 cm hypodense lesion posteriorly in the right hepatic lobe on image 31 series 2. Nonspecific hypodense peripheral lesion in the right hepatic lobe measuring 1.3 by 1.0 cm on image 18 series 2, although previously characterized as a hemangioma. Pancreas: Unremarkable Spleen: Unremarkable Adrenals/Urinary Tract: 5.1 by 4.3 cm Bosniak category 1 cyst of the right kidney lower pole. Distal ureters in urinary bladder obscured by streak artifact from the patient's hip implants. 0.6 cm stone or cluster of stones in the left kidney lower pole on image 69 series 4. Adrenal glands unremarkable. Stomach/Bowel: Prominent stool throughout the colon favors constipation. Vascular/Lymphatic: Aortoiliac atherosclerotic vascular disease. Reproductive: Brachytherapy seeds in the prostate gland, otherwise obscured by streak artifact from the hip implants. Hypodense lesion in the right scrotum measuring up to 2.9 cm in diameter, possibly an epididymal cyst or spermatocele but not fully characterized on today's exam. Other: No intra-abdominal abscess is identified. Musculoskeletal: Hernia mesh markers along the lower  anterior abdominal wall. Bilateral total hip prostheses, a 1.3 by 0.9 cm lucency along the upper margin of the right acetabular shell component is nonspecific but could conceivably reflect early particulate disease. Slightly exaggerated posterior angulation of the left acetabular shell component. Old healed left posterior rib fractures. Multilevel lumbar spondylosis and degenerative disc disease with resulting lumbar impingement. IMPRESSION: 1. Interval placement of a biliary stent extending from the duodenum into the left biliary system. There has been decompression of the left biliary system but the right hepatic lobe biliary system is still moderately dilated. 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 2. Several small hypodense lesions in the liver are probably cysts or similar benign lesions. One of these has been previously characterized as a hemangioma. Surveillance suggested. 3. No compelling findings of distant metastatic spread. 4.  Prominent stool throughout the colon favors constipation. 5. Other imaging findings of potential clinical significance: Minimal atelectasis in both lower lobes. Aortic Atherosclerosis (ICD10-I70.0). Nonobstructive left nephrolithiasis. Brachytherapy seeds in the prostate gland. Possible right epididymal cyst or spermatocele. Lumbar spondylosis and degenerative disc disease causing multilevel impingement. Electronically Signed   By: Van Clines M.D.   On: 07/10/2021 12:48   DG Chest Port 1 View  Result Date: 07/10/2021 CLINICAL DATA:  Questionable sepsis EXAM: PORTABLE CHEST 1 VIEW COMPARISON:  03/18/2016 FINDINGS: Normal heart size and mediastinal contours. There is no edema, consolidation, effusion, or pneumothorax. Subcutaneous implantable cardiac device. IMPRESSION: No evidence of active disease. Electronically Signed   By: Monte Fantasia M.D.   On: 07/10/2021 09:39        Scheduled Meds:  aspirin EC  325 mg Oral Daily   enoxaparin (LOVENOX) injection   40 mg Subcutaneous [id number]   folic acid  1 mg Oral Daily   multivitamin with minerals  1 tablet Oral Daily   pantoprazole  40 mg Oral Daily   traZODone  100 mg Oral QHS   Continuous Infusions:  sodium chloride 100 mL/hr at 07/11/21 0205   ceFEPime (MAXIPIME) IV 2 g (07/11/21 1430)   metronidazole 500 mg (07/11/21 1322)   vancomycin Stopped (07/11/21 0102)     LOS: 1 day    Time spent: 30 minutes    Barb Merino, MD [facility] Pager [phone]

## 2021-07-11 NOTE — ED Notes (Signed)
Patient cleaned, new primofit applied on patient.

## 2021-07-11 NOTE — ED Notes (Signed)
Patient turned to left side

## 2021-07-12 LAB — CBC WITH DIFFERENTIAL/PLATELET
Abs Immature Granulocytes: 0.03 10*3/uL (ref 0.00–0.07)
Basophils Absolute: 0 10*3/uL (ref 0.0–0.1)
Basophils Relative: 1 %
Eosinophils Absolute: 0.3 10*3/uL (ref 0.0–0.5)
Eosinophils Relative: 5 %
HCT: 26.2 % — ABNORMAL LOW (ref 39.0–52.0)
Hemoglobin: 8.6 g/dL — ABNORMAL LOW (ref 13.0–17.0)
Immature Granulocytes: 1 %
Lymphocytes Relative: 18 %
Lymphs Abs: 1.1 10*3/uL (ref 0.7–4.0)
MCH: 31 pg (ref 26.0–34.0)
MCHC: 32.8 g/dL (ref 30.0–36.0)
MCV: 94.6 fL (ref 80.0–100.0)
Monocytes Absolute: 0.6 10*3/uL (ref 0.1–1.0)
Monocytes Relative: 10 %
Neutro Abs: 4 10*3/uL (ref 1.7–7.7)
Neutrophils Relative %: 65 %
Platelets: 361 10*3/uL (ref 150–400)
RBC: 2.77 MIL/uL — ABNORMAL LOW (ref 4.22–5.81)
RDW: 17 % — ABNORMAL HIGH (ref 11.5–15.5)
WBC: 6 10*3/uL (ref 4.0–10.5)
nRBC: 0 % (ref 0.0–0.2)

## 2021-07-12 LAB — COMPREHENSIVE METABOLIC PANEL
ALT: 90 U/L — ABNORMAL HIGH (ref 0–44)
AST: 92 U/L — ABNORMAL HIGH (ref 15–41)
Albumin: 2 g/dL — ABNORMAL LOW (ref 3.5–5.0)
Alkaline Phosphatase: 874 U/L — ABNORMAL HIGH (ref 38–126)
Anion gap: 7 (ref 5–15)
BUN: 12 mg/dL (ref 8–23)
CO2: 21 mmol/L — ABNORMAL LOW (ref 22–32)
Calcium: 8.1 mg/dL — ABNORMAL LOW (ref 8.9–10.3)
Chloride: 104 mmol/L (ref 98–111)
Creatinine, Ser: 0.58 mg/dL — ABNORMAL LOW (ref 0.61–1.24)
GFR, Estimated: >60 mL/min (ref >60)
Glucose, Bld: 86 mg/dL (ref 70–99)
Potassium: 3.5 mmol/L (ref 3.5–5.1)
Sodium: 132 mmol/L — ABNORMAL LOW (ref 135–145)
Total Bilirubin: 5.6 mg/dL — ABNORMAL HIGH (ref 0.3–1.2)
Total Protein: 5.6 g/dL — ABNORMAL LOW (ref 6.5–8.1)

## 2021-07-12 LAB — URINE CULTURE: Culture: 5000 — AB

## 2021-07-12 LAB — PHOSPHORUS: Phosphorus: 2.5 mg/dL (ref 2.5–4.6)

## 2021-07-12 LAB — MAGNESIUM: Magnesium: 2 mg/dL (ref 1.7–2.4)

## 2021-07-12 MED ORDER — PROSOURCE PLUS PO LIQD
30.0000 mL | Freq: Two times a day (BID) | ORAL | Status: DC
  Administered 2021-07-12 – 2021-07-13 (×2): 30 mL via ORAL
  Filled 2021-07-12 (×3): qty 30

## 2021-07-12 MED ORDER — AMOXICILLIN-POT CLAVULANATE 875-125 MG PO TABS
1.0000 | ORAL_TABLET | Freq: Two times a day (BID) | ORAL | Status: DC
  Administered 2021-07-12 – 2021-07-13 (×3): 1 via ORAL
  Filled 2021-07-12 (×3): qty 1

## 2021-07-12 MED ORDER — ENSURE ENLIVE PO LIQD
237.0000 mL | Freq: Two times a day (BID) | ORAL | Status: DC
  Administered 2021-07-12 – 2021-07-13 (×2): 237 mL via ORAL

## 2021-07-12 NOTE — Plan of Care (Signed)
  Problem: Education: Goal: Knowledge of General Education information will improve Description: Including pain rating scale, medication(s)/side effects and non-pharmacologic comfort measures Outcome: Progressing   Problem: Clinical Measurements: Goal: Diagnostic test results will improve Outcome: Progressing   Problem: Activity: Goal: Risk for activity intolerance will decrease Outcome: Progressing   Problem: Coping: Goal: Level of anxiety will decrease Outcome: Progressing   Problem: Safety: Goal: Ability to remain free from injury will improve Outcome: Progressing

## 2021-07-12 NOTE — Progress Notes (Signed)
Initial Nutrition Assessment RD working remotely.  DOCUMENTATION CODES:   Not applicable  INTERVENTION:  - will order Ensure Enlive BID, each supplement provides 350 kcal and 20 grams of protein. - will order Magic Cup BID with meals, each supplement provides 290 kcal and 9 grams of protein. - will order 30 ml Prosource Plus BID, each supplement provides 100 kcal and 15 grams protein.  - complete NFPE when feasible.   NUTRITION DIAGNOSIS:   Increased nutrient needs related to acute illness, chronic illness, cancer and cancer related treatments as evidenced by estimated needs.  GOAL:   Patient will meet greater than or equal to 90% of their needs  MONITOR:   PO intake, Supplement acceptance, Labs, Weight trends  REASON FOR ASSESSMENT:   Malnutrition Screening Tool  ASSESSMENT:   70 year old male with medical history of HTN, stroke with R-sided paresis, recent dx of cholangiocarcinoma with biliary obstruction 7/19 and ERCP on 8/2, HLD, and hx of prostate cancer. He is followed at [facility] for cancer treatment. He presented to the ED with fever and confusion.  Diet advanced from NPO to Heart Healthy on 8/5 at 1730, changed back to NPO yesterday at midnight, and advanced to Soft yesterday at 0948. No intakes have been documented this admission.  Patient was seen by another [facility] RD on 7/20. At that time he met criteria for moderate malnutrition in the context of chronic illness as evidenced by  percent weight loss, moderate fat depletion, moderate muscle depletion.  During that time, patient reported enjoying ice cream and planning to order it with every meal. He reported decreased appetite and intakes x1-2 weeks prior to that admission.   At that time he reported UBW of 180 lb and that he was consistently losing weight. Weight on 8/5 documented as both 144 lb and 148 lb; no other weights since that time. Weight on 06/12/21 was 154 lb. This indicates 6-10 lb weight  loss (4-6.5% body weight) in the past 1 month.  Per notes: - SIRS - acute metabolic encephalopathy--improved - adenocarcinoma of common hepatic duct with recent biliary obstruction - from home with plan to return home at the time of d/c   Labs reviewed; Na: 132 mmol/l, creatinine: 0.58 mg/dl, Ca: 8.1 mg/dl, Alk Phos and LFTs elevated and up from 8/6. Medications reviewed; 1 mg folvite/day, 40 mg oral protonix/day.    NUTRITION - FOCUSED PHYSICAL EXAM:  Unable to complete at this time.  Diet Order:   Diet Order             DIET SOFT Room service appropriate? Yes; Fluid consistency: Thin  Diet effective now                   EDUCATION NEEDS:   No education needs have been identified at this time  Skin:  Skin Assessment: Reviewed RN Assessment  Last BM:  8/6 (type 4 x1)  Height:   Ht Readings from Last 1 Encounters:  07/10/21 5' 9" (1.753 m)    Weight:   Wt Readings from Last 1 Encounters:  07/10/21 65.3 kg      Estimated Nutritional Needs:  Kcal:  1900-2100 kcal Protein:  95-110 grams Fluid:  >/= 2.2 L/day      Jarome Matin, MS, RD, LDN, CNSC Inpatient Clinical Dietitian RD pager # available in [facility]  After hours/weekend pager # available in [facility]

## 2021-07-12 NOTE — Progress Notes (Signed)
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  Assessment & Plan:   Active Problems:   Bile duct obstruction   Obstructive jaundice due to malignant neoplasm (HCC)   SIRS (systemic inflammatory response syndrome) (HCC)  SIRS: Possible source biliary duct.  No positive cultures yet.  Clinically stabilizing.  Treated with vancomycin and Flagyl and cefepime. With clinical improvement, will change to oral Augmentin for 7 days.   Acute metabolic encephalopathy: Secondary to above.  Mental status improved.  Adenocarcinoma of the common hepatic duct: Biliary obstruction.  Followed by gastroenterology and IR.  Clinically stabilizing, interventional radiology decided to hold off on percutaneous biliary drain.  Bilirubin with slight improvement.   If continues to have stable symptoms, will discharge home tomorrow to keep up his appointment with [facility] for treatment of adenocarcinoma.    Hypertension: Risk of hypotension.  Holding home medications.  Depression: Resume Wellbutrin.  Mobilize.  Titrate to oral medications.  Discontinue telemetry.  Work with PT OT.  Can transfer to [facility] bed.   DVT prophylaxis: enoxaparin (LOVENOX) injection 40 mg Start: 07/10/21 1800   Code Status: Full code Family Communication: Wife on the phone. Disposition Plan: Status is: Inpatient  Remains inpatient appropriate because:Inpatient level of care appropriate due to severity of illness  Dispo: The patient is from: Home              Anticipated d/c is to: Home               Patient currently is not medically stable to d/c.   Difficult to place patient No         Consultants:  Gastroenterology Interventional radiology  Procedures:  None  Antimicrobials:  Vancomycin, Flagyl and cefepime 8/5--- 8/7 Augmentin 8/7---   Subjective: Patient seen and examined.  No overnight events.  Afebrile.  He wants to work with physical therapy.  He tells me that he wants to stay up on his own and plans to go home.  Objective: Vitals:   07/11/21 2017 07/12/21 0011 07/12/21 0200 07/12/21 0405  BP: 132/70 (!) 120/58 119/65 103/66  Pulse: 79 69 70 69  Resp: '18 16 14 18  '$ Temp: 98.4 F (36.9 C) 98.4 F (36.9 C)  97.6 F (36.4 C)  TempSrc: Oral Oral  Oral  SpO2: 100% 100% 100% 100%  Weight:      Height:        Intake/Output Summary (Last 24 hours) at 07/12/2021 1157 Last data filed at 07/12/2021 0645 Gross per 24 hour  Intake 3427.92 ml  Output 750 ml  Net 2677.92 ml   Filed Weights   07/10/21 0848 07/10/21 1012  Weight: 67 kg 65.3 kg    Examination:  General exam: Appears calm and comfortable  Frail and debilitated.  Icteric. Respiratory system: Clear to auscultation. Respiratory effort normal.  No added sounds. Cardiovascular system: S1 & S2 heard, RRR.  Gastrointestinal system: Soft and nontender.  Bowel sounds present. Central nervous system: Alert and oriented.  Patient has chronic right-sided weakness more than left.   Data Reviewed: I have personally reviewed following labs and imaging  studies  CBC: Recent Labs  Lab 07/10/21 1004 07/10/21 1021 07/11/21 0340 07/12/21 0247  WBC 10.8*  --  5.6 6.0  NEUTROABS 9.8*  --   --  4.0  HGB 9.5* 10.2* 8.9* 8.6*  HCT 29.2* 30.0* 27.0* 26.2*  MCV 95.1  --  96.4 94.6  PLT 395  --  333 [id number]   Basic Metabolic Panel: Recent Labs  Lab 07/10/21 1004 07/10/21 1021 07/11/21 0340 07/12/21 0247  NA 131* 133* 133* 132*  K 3.8 3.9 3.6 3.5  CL 99 99 103 104  CO2 24  --  22 21*  GLUCOSE 95 95  85 86  BUN '15 12 13 12  '$ CREATININE 0.80 0.60* 0.67 0.58*  CALCIUM 8.9  --  8.5* 8.1*  MG  --   --   --  2.0  PHOS  --   --   --  2.5   GFR: Estimated Creatinine Clearance: 78.2 mL/min (A) (by C-G formula based on SCr of 0.58 mg/dL (L)). Liver Function Tests: Recent Labs  Lab 07/10/21 1004 07/11/21 0340 07/12/21 0247  AST 94* 73* 92*  ALT 113* 86* 90*  ALKPHOS 845* 731* 874*  BILITOT 7.5* 6.0* 5.6*  PROT 6.6 5.8* 5.6*  ALBUMIN 2.5* 2.2* 2.0*   Recent Labs  Lab 07/10/21 1004  LIPASE 27   Recent Labs  Lab 07/10/21 1004  AMMONIA 50*   Coagulation Profile: Recent Labs  Lab 07/10/21 1004 07/10/21 1647 07/11/21 0340  INR 1.0 1.0 1.1   Cardiac Enzymes: No results for input(s): CKTOTAL, CKMB, CKMBINDEX, TROPONINI in the last 168 hours. BNP (last 3 results) No results for input(s): PROBNP in the last 8760 hours. HbA1C: No results for input(s): HGBA1C in the last 72 hours. CBG: No results for input(s): GLUCAP in the last 168 hours. Lipid Profile: No results for input(s): CHOL, HDL, LDLCALC, TRIG, CHOLHDL, LDLDIRECT in the last 72 hours. Thyroid Function Tests: No results for input(s): TSH, T4TOTAL, FREET4, T3FREE, THYROIDAB in the last 72 hours. Anemia Panel: No results for input(s): VITAMINB12, FOLATE, FERRITIN, TIBC, IRON, RETICCTPCT in the last 72 hours. Sepsis Labs: Recent Labs  Lab 07/10/21 1004 07/10/21 1239 07/11/21 0340  PROCALCITON  --   --  1.43  LATICACIDVEN 1.3 1.8  --     Recent Results (from the past 240 hour(s))  Resp Panel by RT-PCR (Flu A&B, Covid) Nasopharyngeal Swab     Status: None   Collection Time: 07/10/21  9:11 AM   Specimen: Nasopharyngeal Swab; Nasopharyngeal(NP) swabs in vial transport medium  Result Value Ref Range Status   SARS Coronavirus 2 by RT PCR NEGATIVE NEGATIVE Final    Comment: (NOTE) SARS-CoV-2 target nucleic acids are NOT DETECTED.  The SARS-CoV-2 RNA is generally detectable in upper respiratory specimens during  the acute phase of infection. The lowest concentration of SARS-CoV-2 viral copies this assay can detect is 138 copies/mL. A negative result does not preclude SARS-Cov-2 infection and should not be used as the sole basis for treatment or other patient management decisions. A negative result may occur with  improper specimen collection/handling, submission of specimen other than nasopharyngeal swab, presence of viral mutation(s) within the areas targeted by this assay, and inadequate number of viral copies(<138 copies/mL). A negative result must be combined with clinical observations, patient history, and epidemiological information. The expected result is Negative.  Fact Sheet for Patients:  EntrepreneurPulse.com.au  Fact Sheet for Healthcare Providers:  IncredibleEmployment.be  This test is no t yet approved or cleared by  the [employer] and  has been authorized for detection and/or diagnosis of SARS-CoV-2 by FDA under an Emergency Use Authorization (EUA). This EUA will remain  in effect (meaning this test can be used) for the duration of the COVID-19 declaration under Section 564(b)(1) of the Act, 21 U.S.C.section 360bbb-3(b)(1), unless the authorization is terminated  or revoked sooner.       Influenza A by PCR NEGATIVE NEGATIVE Final   Influenza B by PCR NEGATIVE NEGATIVE Final    Comment: (NOTE) The Xpert Xpress SARS-CoV-2/FLU/RSV plus assay is intended as an aid in the diagnosis of influenza from Nasopharyngeal swab specimens and should not be used as a sole basis for treatment. Nasal washings and aspirates are unacceptable for Xpert Xpress SARS-CoV-2/FLU/RSV testing.  Fact Sheet for Patients: EntrepreneurPulse.com.au  Fact Sheet for Healthcare Providers: IncredibleEmployment.be  This test is not yet approved or cleared by the [place] FDA and has been authorized for detection and/or  diagnosis of SARS-CoV-2 by FDA under an Emergency Use Authorization (EUA). This EUA will remain in effect (meaning this test can be used) for the duration of the COVID-19 declaration under Section 564(b)(1) of the Act, 21 U.S.C. section 360bbb-3(b)(1), unless the authorization is terminated or revoked.  Performed at [facility], [place] [address], [place], [place] [address]   Blood Culture (routine x 2)     Status: None (Preliminary result)   Collection Time: 07/10/21  9:55 AM   Specimen: BLOOD RIGHT WRIST  Result Value Ref Range Status   Specimen Description   Final    BLOOD RIGHT WRIST Performed at [facility], [address] [place], [place] [address]    Special Requests   Final    BOTTLES DRAWN AEROBIC AND ANAEROBIC Blood Culture results may not be optimal due to an inadequate volume of blood received in culture bottles Performed at [facility] [address] [place], [place] [address]    Culture   Final    NO GROWTH 2 DAYS Performed at [facility] [address] [place], [place] [address]    Report Status PENDING  Incomplete  Blood Culture (routine x 2)     Status: None (Preliminary result)   Collection Time: 07/10/21 10:05 AM   Specimen: BLOOD  Result Value Ref Range Status   Specimen Description   Final    BLOOD RIGHT ANTECUBITAL Performed at [facility] [address] [place], [place] [address]    Special Requests   Final    BOTTLES DRAWN AEROBIC AND ANAEROBIC Blood Culture results may not be optimal due to an inadequate volume of blood received in culture bottles Performed at [facility] [address] [place], [place] [address]    Culture   Final    NO GROWTH 2 DAYS Performed at [facility] [address] [place], [place] [address]    Report Status PENDING  Incomplete         Radiology Studies: CT ABDOMEN PELVIS W CONTRAST  Result Date:  07/10/2021 CLINICAL DATA:  Abdominal abscess/infection. Altered mental status. Biliary tumor, likely cholangiocarcinoma. Jaundice. EXAM: CT ABDOMEN AND PELVIS WITH CONTRAST TECHNIQUE: Multidetector CT imaging of the abdomen and pelvis was performed using the standard protocol following bolus administration of intravenous contrast. CONTRAST:  5m OMNIPAQUE IOHEXOL 350 MG/ML SOLN COMPARISON:  06/11/2021 and MRI from 06/23/2021 FINDINGS: Lower chest: Mild dependent atelectasis in both lower lobes. Descending thoracic aortic atherosclerotic calcification. Hepatobiliary:  There has been placement of a biliary stent which extends into the left bile duct. Interval decompression of the left biliary tree although there is continued moderate biliary dilatation in the right hepatic lobe. Indistinct tissue planes along the confluence of the right and left hepatic lobes suspicious for Klatskin tumor. Contracted gallbladder containing high density material compatible with contrast medium. 1.1 by 0.9 cm cyst in segment 2 of the liver. Nonspecific 0.6 cm hypodense lesion in segment 4a. Probable 1.2 by 0.8 cm cyst in segment 4b of the liver on image 28 series 2, versus a dilated biliary element. Nonspecific 0.3 cm hypodense lesion posteriorly in the right hepatic lobe on image 31 series 2. Nonspecific hypodense peripheral lesion in the right hepatic lobe measuring 1.3 by 1.0 cm on image 18 series 2, although previously characterized as a hemangioma. Pancreas: Unremarkable Spleen: Unremarkable Adrenals/Urinary Tract: 5.1 by 4.3 cm Bosniak category 1 cyst of the right kidney lower pole. Distal ureters in urinary bladder obscured by streak artifact from the patient's hip implants. 0.6 cm stone or cluster of stones in the left kidney lower pole on image 69 series 4. Adrenal glands unremarkable. Stomach/Bowel: Prominent stool throughout the colon favors constipation. Vascular/Lymphatic: Aortoiliac atherosclerotic vascular disease.  Reproductive: Brachytherapy seeds in the prostate gland, otherwise obscured by streak artifact from the hip implants. Hypodense lesion in the right scrotum measuring up to 2.9 cm in diameter, possibly an epididymal cyst or spermatocele but not fully characterized on today's exam. Other: No intra-abdominal abscess is identified. Musculoskeletal: Hernia mesh markers along the lower anterior abdominal wall. Bilateral total hip prostheses, a 1.3 by 0.9 cm lucency along the upper margin of the right acetabular shell component is nonspecific but could conceivably reflect early particulate disease. Slightly exaggerated posterior angulation of the left acetabular shell component. Old healed left posterior rib fractures. Multilevel lumbar spondylosis and degenerative disc disease with resulting lumbar impingement. IMPRESSION: 1. Interval placement of a biliary stent extending from the duodenum into the left biliary system. There has been decompression of the left biliary system but the right hepatic lobe biliary system is still moderately dilated. 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 2. Several small hypodense lesions in the liver are probably cysts or similar benign lesions. One of these has been previously characterized as a hemangioma. Surveillance suggested. 3. No compelling findings of distant metastatic spread. 4.  Prominent stool throughout the colon favors constipation. 5. Other imaging findings of potential clinical significance: Minimal atelectasis in both lower lobes. Aortic Atherosclerosis (ICD10-I70.0). Nonobstructive left nephrolithiasis. Brachytherapy seeds in the prostate gland. Possible right epididymal cyst or spermatocele. Lumbar spondylosis and degenerative disc disease causing multilevel impingement. Electronically Signed   By: Van Clines M.D.   On: 07/10/2021 12:48        Scheduled Meds:  (feeding supplement) PROSource Plus  30 mL Oral BID BM   amoxicillin-clavulanate  1  tablet Oral Q12H   aspirin EC  325 mg Oral Daily   enoxaparin (LOVENOX) injection  40 mg Subcutaneous Q24H   feeding supplement  237 mL Oral BID BM   folic acid  1 mg Oral Daily   multivitamin with minerals  1 tablet Oral Daily   pantoprazole  40 mg Oral Daily   traZODone  100 mg Oral QHS   Continuous Infusions:     LOS: 2 days    Time spent: 30 minutes    Barb Merino, MD [facility] Pager [phone]

## 2021-07-13 DIAGNOSIS — R6510 Systemic inflammatory response syndrome (SIRS) of non-infectious origin without acute organ dysfunction: Secondary | ICD-10-CM

## 2021-07-13 LAB — CBC WITH DIFFERENTIAL/PLATELET
Abs Immature Granulocytes: 0.07 10*3/uL (ref 0.00–0.07)
Basophils Absolute: 0 10*3/uL (ref 0.0–0.1)
Basophils Relative: 1 %
Eosinophils Absolute: 0.5 10*3/uL (ref 0.0–0.5)
Eosinophils Relative: 7 %
HCT: 26.7 % — ABNORMAL LOW (ref 39.0–52.0)
Hemoglobin: 8.8 g/dL — ABNORMAL LOW (ref 13.0–17.0)
Immature Granulocytes: 1 %
Lymphocytes Relative: 18 %
Lymphs Abs: 1.3 10*3/uL (ref 0.7–4.0)
MCH: 30.9 pg (ref 26.0–34.0)
MCHC: 33 g/dL (ref 30.0–36.0)
MCV: 93.7 fL (ref 80.0–100.0)
Monocytes Absolute: 0.7 10*3/uL (ref 0.1–1.0)
Monocytes Relative: 9 %
Neutro Abs: 4.8 10*3/uL (ref 1.7–7.7)
Neutrophils Relative %: 64 %
Platelets: 375 10*3/uL (ref 150–400)
RBC: 2.85 MIL/uL — ABNORMAL LOW (ref 4.22–5.81)
RDW: 17 % — ABNORMAL HIGH (ref 11.5–15.5)
WBC: 7.4 10*3/uL (ref 4.0–10.5)
nRBC: 0 % (ref 0.0–0.2)

## 2021-07-13 LAB — COMPREHENSIVE METABOLIC PANEL
ALT: 89 U/L — ABNORMAL HIGH (ref 0–44)
AST: 95 U/L — ABNORMAL HIGH (ref 15–41)
Albumin: 2.2 g/dL — ABNORMAL LOW (ref 3.5–5.0)
Alkaline Phosphatase: 1026 U/L — ABNORMAL HIGH (ref 38–126)
Anion gap: 9 (ref 5–15)
BUN: 12 mg/dL (ref 8–23)
CO2: 21 mmol/L — ABNORMAL LOW (ref 22–32)
Calcium: 8.7 mg/dL — ABNORMAL LOW (ref 8.9–10.3)
Chloride: 107 mmol/L (ref 98–111)
Creatinine, Ser: 0.55 mg/dL — ABNORMAL LOW (ref 0.61–1.24)
GFR, Estimated: >60 mL/min (ref >60)
Glucose, Bld: 87 mg/dL (ref 70–99)
Potassium: 3.4 mmol/L — ABNORMAL LOW (ref 3.5–5.1)
Sodium: 137 mmol/L (ref 135–145)
Total Bilirubin: 4.8 mg/dL — ABNORMAL HIGH (ref 0.3–1.2)
Total Protein: 5.7 g/dL — ABNORMAL LOW (ref 6.5–8.1)

## 2021-07-13 MED ORDER — AMOXICILLIN-POT CLAVULANATE 875-125 MG PO TABS: 1.0000 | ORAL_TABLET | Freq: Two times a day (BID) | ORAL | 0 refills | Status: AC

## 2021-07-13 MED ORDER — LACTULOSE 10 GM/15ML PO SOLN: 20.0000 g | Freq: Two times a day (BID) | ORAL | 2 refills | Status: DC | PRN

## 2021-07-13 MED ORDER — POTASSIUM CHLORIDE CRYS ER 20 MEQ PO TBCR
40.0000 meq | EXTENDED_RELEASE_TABLET | Freq: Once | ORAL | Status: AC
  Administered 2021-07-13: 40 meq via ORAL
  Filled 2021-07-13: qty 2

## 2021-07-13 NOTE — Care Management Important Message (Signed)
Important Message  Patient Details IM Letter placed in Patient's room. Name: Ruben Chavez MRN: [id number] Date of Birth: [birth year]   Medicare Important Message Given:  Yes     Kerin Salen 07/13/2021, 2:51 PM

## 2021-07-13 NOTE — Plan of Care (Signed)
  Problem: Activity: Goal: Risk for activity intolerance will decrease 07/13/2021 1545 by Lennie Hummer, RN Outcome: Adequate for Discharge 07/13/2021 1030 by Lennie Hummer, RN Outcome: Progressing   Problem: Coping: Goal: Level of anxiety will decrease 07/13/2021 1545 by Lennie Hummer, RN Outcome: Adequate for Discharge 07/13/2021 1030 by Lennie Hummer, RN Outcome: Progressing   Problem: Elimination: Goal: Will not experience complications related to bowel motility Outcome: Adequate for Discharge   Problem: Safety: Goal: Ability to remain free from injury will improve 07/13/2021 1545 by Lennie Hummer, RN Outcome: Adequate for Discharge 07/13/2021 1030 by Lennie Hummer, RN Outcome: Progressing

## 2021-07-13 NOTE — Evaluation (Signed)
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   Clinical Impression   Patient evaluated by Occupational Therapy with no further acute OT needs identified. All education has been completed and the patient has no further questions. Patient reported being at baseline for ADLs at this time. Patient declined for therapist to assess transfers, ADLs or mobility at this time. Patient reported being d/c today home with wife.  Patient inquired about where to find long lasting condom cath to use at home. Patient was educated on concerns about functional strength, skin integrity and hygiene with chronic use. Patient verbalized understanding. See below for any follow-up Occupational Therapy or equipment needs. OT is signing off. Thank you for this referral.     Follow Up Recommendations  No OT follow up    Equipment Recommendations  None recommended by OT    Recommendations for Other Services       Precautions / Restrictions Precautions Precautions: Fall Precaution Comments: Left sided hemiparesis, Restrictions Weight Bearing Restrictions: No      Mobility Bed Mobility Overal bed mobility: Needs Assistance Bed Mobility: Supine to Sit     Supine to sit: HOB elevated     General bed mobility comments: unable to assess. patient reported " go as PT from earlier"    Transfers Overall transfer level: Needs assistance Equipment used: Straight cane Transfers: Sit to/from Stand Sit to Stand: Min assist         General transfer  comment: patient directed technique to be performed. first time pulled up on recliner arm, second pulled up on therapist stating that is technique he uses PTA with wife assisting.    Balance Overall balance assessment: Needs assistance Sitting-balance support: No upper extremity supported;Feet supported Sitting balance-Leahy Scale: Good     Standing balance support: Single extremity supported Standing balance-Leahy Scale: Poor Standing balance comment: reliant on SPC and very close guarding for gait                           ADL either performed or assessed with clinical judgement   ADL Overall ADL's : Needs assistance/impaired Eating/Feeding: Set up;Sitting Eating/Feeding Details (indicate cue type and reason): moving items around on tray table to set up for meal. Grooming: Set up;Bed level;Oral care;Wash/dry face   Upper Body Bathing: Set up;Sitting   Lower Body Bathing: Minimal assistance;Sit to/from stand   Upper Body Dressing : Set up;Sitting   Lower Body Dressing: Minimal assistance;Sit to/from stand   Toilet Transfer: Minimal Production assistant, radio Details (indicate cue type and reason): patient declined to complete transfers on this date reporting to " go as the PT she saw everything already" Toileting- Clothing Manipulation and Hygiene: Minimal assistance;Sit to/from stand       Functional mobility during ADLs: Minimal assistance;Cane;Cueing for safety;Cueing for sequencing General ADL Comments: patient reported beign back at baseline at this time and not needing OT services. patient repeadly asked therapist to go ask PT about earlier session when asked to particiapte in tasks. patient inquired about condomn caths that would last longer than one day. patient was  educated on functional ativity tolerance, skin integrity and hygiene concerns with use of condom caths at all times. patient did not appear to have good self awareness of deficits.  patient reported when he was back to 100% he would stop using condom caths at all times.     Vision Patient Visual Report: No change from baseline       Perception     Praxis      Pertinent Vitals/Pain Pain Assessment: No/denies pain     Hand Dominance Right   Extremity/Trunk Assessment Upper Extremity Assessment Upper Extremity Assessment: Defer to OT evaluation RUE Deficits / Details: WFL ROM 5/5 strength RUE Sensation: WNL RUE Coordination: WNL LUE Deficits / Details: Left shoulder exhibits gross ROM and 4-/5 strength, 4/5 elbow strength, wrist 3-/5, fingers 2+/5 LUE Sensation: decreased light touch;decreased proprioception LUE Coordination: decreased fine motor;decreased gross motor   Lower Extremity Assessment RLE Deficits / Details: grossly WFL for strength and ROM LLE Deficits / Details: grossly WFL for strength and ROM for mobility  and ambulation LLE Coordination: decreased gross motor   Cervical / Trunk Assessment Cervical / Trunk Assessment: Kyphotic   Communication Communication Communication: No difficulties   Cognition Arousal/Alertness: Awake/alert Behavior During Therapy: WFL for tasks assessed/performed Overall Cognitive Status: No family/caregiver present to determine baseline cognitive functioning Area of Impairment: Problem solving                             Problem Solving: Slow processing;Difficulty sequencing;Requires verbal cues General Comments: generally verbally processes his actions for mobility, step by step.   General Comments       Exercises     Shoulder Instructions      Home Living Family/patient expects to be discharged to:: Private residence Living Arrangements: Spouse/significant other Available Help at Discharge: Family;Available 24 hours/day Type of Home: House Home Access: Stairs to enter [employer] of Steps: 1 Entrance Stairs-Rails: None Home Layout: 1/2 bath on main level;Two  level Alternate Level Stairs-Number of Steps: 14 Alternate Level Stairs-Rails: Right;Left Bathroom Shower/Tub: Occupational psychologist: Standard     Home Equipment: Kasandra Knudsen - single point   Additional Comments: Stroke in Feb 2021      Prior Functioning/Environment Level of Independence: Needs assistance  Gait / Transfers Assistance Needed: ambulates with cane,negotiates steps mod. I,  Indep with shower, states drives.. ADL's / Homemaking Assistance Needed: mostly independent per pt.            OT Problem List:        OT Treatment/Interventions:      OT Goals(Current goals can be found in the care plan section) Acute Rehab OT Goals Patient Stated Goal: [place]  OT Frequency:     Barriers to D/C:            Co-evaluation              AM-PAC OT "6 Clicks" Daily Activity     Outcome Measure Help from another person eating meals?: A Little Help from another person taking care of personal grooming?: A Little Help from another person toileting, which includes using toliet, bedpan, or urinal?: A Little Help from another person bathing (including washing, rinsing, drying)?: A Little Help from another person to put on and taking off regular upper body clothing?: A Little Help from another person to put on and taking off regular lower body  clothing?: A Little 6 Click Score: 18   End of Session Nurse Communication: Other (comment) (nurse cleared patient to participate on this date.)  Activity Tolerance: Patient tolerated treatment well Patient left: in chair;with call bell/phone within reach;with chair alarm set  OT Visit Diagnosis: Muscle weakness (generalized) (M62.81)                Time: [id number] OT Time Calculation (min): 15 min Charges:  OT General Charges $OT Visit: 1 Visit OT Evaluation $OT Eval Low Complexity: 1 Low  Leota Sauers, MS Acute Rehabilitation Department Office# [phone] Pager#  [phone]   [place] 07/13/2021, 1:30 PM

## 2021-07-13 NOTE — Progress Notes (Addendum)
Patient ID: Ruben Chavez, male   DOB: 1951-07-14, 70 y.o.   MRN: [id number]    Progress Note   Subjective   Day # 4  CC; cholangiocarcinoma with biliary obstruction  Status postdrainage of left system 06/23/2021 (here), ERCP 07/07/2021 [facility]  Currently on Augmentin  Labs today WBC 7.4/hemoglobin 8.8/hematocrit 26.7/platelets 375 T bili 4.8/alk phos 1026/AST 95/ALT 89-stable  Eating solid food without difficulty, no complaints of nausea no abdominal pain.  He is asking about physical therapy before transitioning to home.   Objective   Vital signs in last 24 hours: Temp:  [97.6 F (36.4 C)-98.4 F (36.9 C)] 97.6 F (36.4 C) (08/08 0330) Pulse Rate:  [70-81] 70 (08/08 0330) Resp:  [17-19] 19 (08/08 0330) BP: (107-129)/(61-70) 129/70 (08/08 0330) SpO2:  [99 %-100 %] 100 % (08/08 0330) Last BM Date: 07/11/21 General:   Older white male in NAD, mild scleral icterus Heart:  Regular rate and rhythm; no murmurs Lungs: Respirations even and unlabored, lungs CTA bilaterally Abdomen:  Soft, nontender and nondistended. Normal bowel sounds. Extremities:  Without edema. Neurologic:  Alert and oriented,  grossly normal neurologically. Psych:  Cooperative. Normal mood and affect.  Intake/Output from previous day: 08/07 0701 - 08/08 0700 In: 1140 [P.O.:1140] Out: 1550 [Urine:1550] Intake/Output this shift: No intake/output data recorded.  Lab Results: Recent Labs    07/11/21 0340 07/12/21 0247 07/13/21 0329  WBC 5.6 6.0 7.4  HGB 8.9* 8.6* 8.8*  HCT 27.0* 26.2* 26.7*  PLT 333 361 375   BMET Recent Labs    07/11/21 0340 07/12/21 0247 07/13/21 0329  NA 133* 132* 137  K 3.6 3.5 3.4*  CL 103 104 107  CO2 22 21* 21*  GLUCOSE 85 86 87  BUN 13 12 12   CREATININE 0.67 0.58* 0.55*  CALCIUM 8.5* 8.1* 8.7*   LFT Recent Labs    07/13/21 0329  PROT 5.7*  ALBUMIN 2.2*  AST 95*  ALT 89*  ALKPHOS 1,026*  BILITOT 4.8*   PT/INR Recent Labs    07/10/21 1647  07/11/21 0340  LABPROT 13.4 13.7  INR 1.0 1.1        Assessment / Plan:    #7 70 year old white male with new diagnosis of cholangiocarcinoma at the hilum with biliary obstruction.  He has had drainage of the left system with biliary stent, right system not drained. Admitted with SIRS, fever and confusion consistent with early cholangitis.  He has improved with IV antibiotics and has transition to Augmentin. LFTs improved/stable  Plan; hopefully patient can be discharged home in the next 24 hours once he is cleared by PT. He relates he has follow-up with oncology at [facility] later this week.  Will need labs repeated at that time. He will eventually need to be right biliary system drained, should get GI follow-up appointment arranged [facility] as well.    Active Problems:   Bile duct obstruction   Obstructive jaundice due to malignant neoplasm (HCC)   SIRS (systemic inflammatory response syndrome) (HCC)     LOS: 3 days   Amy Esterwood PA-C 07/13/2021, 8:49 AM  GI ATTENDING  Interval history data reviewed.  Agree with interval progress note as outlined above.  Plans for discharge on antibiotics noted.  The patient will follow-up with [facility] later this week regarding his biliary cancer.  We will sign off  Coyt Govoni N. Geri Seminole., M.D. [facility] Division of Gastroenterology

## 2021-07-13 NOTE — Evaluation (Signed)
Physical Therapy Evaluation Patient Details Name: Ruben Chavez MRN: [id number] DOB: 11/09/1951 Today's Date: 07/13/2021   History of Present Illness  70 year old gentleman with multiple medical issues including hypertension and stroke with right-sided paresis, recently diagnosed with cholangiocarcinoma due to biliary obstruction on 7/19, ERCP on 8/2 due to malignant common hepatic duct stricture followed up at [facility] presents back to the hospital with fever and confusion.  Clinical Impression  The patient is eager to ambulate and return home.  The patient  presents with slow and methodical instructions on how he mobilized PTA.Marland Kitchen Patient ambulated x 130' using SPC, taking  increased time for slow pace, step to  pattern. Patient  noted to have decreased step length and more flexed posture near end of distance. PT provided very close guarding. Gait appeared  unsteady at times and PT  provided  extra close guard, especially when a hospital bed coming down hall and during 180* turn around.  PTA, mod I in gait and negotiated flight of steps, also stated he drove prior to previous admission recently  Patient declined follow up PT .  Pt admitted with above diagnosis.  Pt currently with functional limitations due to the deficits listed below (see PT Problem List). Pt will benefit from skilled PT to increase their independence and safety with mobility to allow discharge to the venue listed below.        Follow Up Recommendations  (patient declines PT after DC.)    Equipment Recommendations  None recommended by PT    Recommendations for Other Services       Precautions / Restrictions Precautions Precautions: Fall Precaution Comments: Left sided hemiparesis,      Mobility  Bed Mobility Overal bed mobility: Needs Assistance Bed Mobility: Supine to Sit     Supine to sit: HOB elevated     General bed mobility comments: used rail and then HHA to fully sit upright.     Transfers Overall transfer level: Needs assistance Equipment used: Straight cane Transfers: Sit to/from Stand Sit to Stand: Min assist         General transfer comment: patient directed technique to be performed. first time pulled up on recliner arm, second pulled up on therapist stating that is technique he uses PTA with wife assisting.  Ambulation/Gait Ambulation/Gait assistance: Min assist Gait Distance (Feet): 130 Feet Assistive device: Straight cane Gait Pattern/deviations: Step-to pattern;Decreased step length - right;Decreased step length - left;Decreased stride length;Shuffle;Trunk flexed;Narrow base of support Gait velocity: decr Gait velocity interpretation: <1.8 ft/sec, indicate of risk for recurrent falls General Gait Details: Step length shortened  near end of walk. Also notedtrunk instability and more fleexed in trunk. patient denies feeling  fatigued.  Stairs            Wheelchair Mobility    Modified Rankin (Stroke Patients Only)       Balance Overall balance assessment: Needs assistance Sitting-balance support: No upper extremity supported;Feet supported Sitting balance-Leahy Scale: Good     Standing balance support: Single extremity supported Standing balance-Leahy Scale: Poor Standing balance comment: reliant on SPC and very close guarding for gait                             Pertinent Vitals/Pain Pain Assessment: No/denies pain    Home Living Family/patient expects to be discharged to:: Private residence Living Arrangements: Spouse/significant other Available Help at Discharge: Family;Available 24 hours/day Type of Home: House Home Access: Stairs  to enter Entrance Stairs-Rails: None Entrance Stairs-Number of Steps: 1 Home Layout: 1/2 bath on main level;Two level Home Equipment: Kasandra Knudsen - single point Additional Comments: Stroke in Feb 2021    Prior Function Level of Independence: Needs assistance   Gait / Transfers  Assistance Needed: ambulates with cane,negotiates steps mod. I,  Indep with shower, states drives..  ADL's / Homemaking Assistance Needed: mostly independent per pt.        Hand Dominance        Extremity/Trunk Assessment   Upper Extremity Assessment Upper Extremity Assessment: Defer to OT evaluation    Lower Extremity Assessment RLE Deficits / Details: grossly WFL for strength and ROM LLE Deficits / Details: grossly WFL for strength and ROM for mobility  and ambulation LLE Coordination: decreased gross motor    Cervical / Trunk Assessment Cervical / Trunk Assessment: Kyphotic  Communication      Cognition Arousal/Alertness: Awake/alert Behavior During Therapy: WFL for tasks assessed/performed Overall Cognitive Status: No family/caregiver present to determine baseline cognitive functioning Area of Impairment: Problem solving                             Problem Solving: Slow processing;Difficulty sequencing;Requires verbal cues General Comments: generally verbally processes his actions for mobility, step by step.      General Comments      Exercises     Assessment/Plan    PT Assessment Patient needs continued PT services  PT Problem List Decreased strength;Decreased mobility;Decreased activity tolerance;Decreased balance;Decreased coordination       PT Treatment Interventions DME instruction;Therapeutic activities;Gait training;Functional mobility training;Therapeutic exercise;Patient/family education;Balance training    PT Goals (Current goals can be found in the Care Plan section)  Acute Rehab PT Goals Patient Stated Goal: [place] PT Goal Formulation: With patient Time For Goal Achievement: 07/27/21 Potential to Achieve Goals: Good    Frequency Min 3X/week   Barriers to discharge        Co-evaluation               AM-PAC PT "6 Clicks" Mobility  Outcome Measure Help needed turning  from your back to your side while in a flat bed without using bedrails?: A Little Help needed moving from lying on your back to sitting on the side of a flat bed without using bedrails?: A Little Help needed moving to and from a bed to a chair (including a wheelchair)?: A Little Help needed standing up from a chair using your arms (e.g., wheelchair or bedside chair)?: A Little Help needed to walk in hospital room?: A Little Help needed climbing 3-5 steps with a railing? : A Lot 6 Click Score: 17    End of Session Equipment Utilized During Treatment: Gait belt Activity Tolerance: Patient tolerated treatment well Patient left: in chair;with call bell/phone within reach;with chair alarm set Nurse Communication: Mobility status PT Visit Diagnosis: Other abnormalities of gait and mobility (R26.89);Other symptoms and signs involving the nervous system (R29.898)    Time: [id number] PT Time Calculation (min) (ACUTE ONLY): 50 min   Charges:   PT Evaluation $PT Eval Low Complexity: 1 Low PT Treatments $Gait Training: 23-37 mins        Tresa Endo PT Acute Rehabilitation Services Pager [phone] Office [phone]   Claretha Cooper 07/13/2021, 11:45 AM

## 2021-07-13 NOTE — Discharge Summary (Signed)
Physician Discharge Summary  Ruben Chavez [id number] DOB: January 05, 1951 DOA: 07/10/2021  PCP: Isaac Bliss, Rayford Halsted, MD  Admit date: 07/10/2021 Discharge date: 07/13/2021  Admitted From: Home Disposition: Home  Recommendations for Outpatient Follow-up:  Follow-up with oncology at [facility] as a scheduled this Thursday. Recheck liver function test in 1 week.  Home Health: Not applicable.  Declined. Equipment/Devices: Not applicable  Discharge Condition: Stable CODE STATUS: Full code Diet recommendation: Regular diet  Discharge summary: 70 year old Ruben Chavez with multiple medical issues including hypertension and history of a stroke with right hemiparesis who was recently diagnosed with cholangiocarcinoma as diagnosed after finding biliary obstruction on 7/19.  ERCP on 8/2 due to malignant common hepatic duct stricture and followed at [facility] presented to the hospital with fever and confusion.  Low-grade temperature and confusion, admitted to hospital and treated with broad-spectrum antibiotics for presumed biliary source.  Patient was found with biliary obstruction.  Followed by gastroenterology and IR.  Patient clinically stabilized and liver functions improved. Blood cultures negative. Currently asymptomatic.  Sepsis with possible biliary source.  Plan: Due to rapid improvement of symptoms, antibiotics were titrated down, will discharge with 7 more days of Augmentin. Patient is a scheduled to follow-up with oncology at [facility] that he will keep up. He will resume all his home medications.   Discharge Diagnoses:  Active Problems:   Bile duct obstruction   Obstructive jaundice due to malignant neoplasm (HCC)   SIRS (systemic inflammatory response syndrome) [facility])    Discharge Instructions  Discharge Instructions     Call MD for:  persistant nausea and vomiting   Complete by: As directed    Call MD for:  severe uncontrolled pain   Complete by: As  directed    Call MD for:  temperature >100.4   Complete by: As directed    Diet - low sodium heart healthy   Complete by: As directed    Increase activity slowly   Complete by: As directed       Allergies as of 07/13/2021       Reactions   Sulfa Antibiotics Swelling, Other (See Comments)   Swelling in ankles Swelling in ankles   Advil [ibuprofen] Other (See Comments)   "Dots on chest" (Petechiae)   Aleve [naproxen] Other (See Comments)   "Dots on chest" (Petechiae)   Betadine [povidone Iodine] Itching, Rash   Chloroxylenol (antiseptic) Itching, Rash, Other (See Comments)   PCMX surgical sterilizing scrub   Poison Ivy Extract Rash   Povidone-iodine Itching, Rash   BETADINE        Medication List     STOP taking these medications    ciprofloxacin 500 MG tablet Commonly known as: CIPRO   polyethylene glycol 17 g packet Commonly known as: MIRALAX / GLYCOLAX       TAKE these medications    amoxicillin-clavulanate 875-125 MG tablet Commonly known as: AUGMENTIN Take 1 tablet by mouth every 12 (twelve) hours for 6 days.   aspirin EC 325 MG tablet Take 325 mg by mouth daily. What changed: Another medication with the same name was removed. Continue taking this medication, and follow the directions you see here.   buPROPion 300 MG 24 hr tablet Commonly known as: WELLBUTRIN XL TAKE 1 TABLET(300 MG) BY MOUTH DAILY   folic acid 1 MG tablet Commonly known as: FOLVITE TAKE 1 TABLET(1 MG) BY MOUTH DAILY   lactulose 10 GM/15ML solution Commonly known as: CHRONULAC Take 30 mLs (20 g total) by mouth  2 (two) times daily as needed for mild constipation or moderate constipation.   lisinopril-hydrochlorothiazide 20-25 MG tablet Commonly known as: ZESTORETIC TAKE 1 TABLET BY MOUTH DAILY   Magnesium 250 MG Tabs Take 250 mg by mouth every morning.   multivitamin with minerals Tabs tablet Take 1 tablet by mouth daily.   pantoprazole 40 MG tablet Commonly known as:  PROTONIX Take 1 tablet (40 mg total) by mouth daily.   traZODone 100 MG tablet Commonly known as: DESYREL TAKE 1 TABLET(100 MG) BY MOUTH AT BEDTIME AS NEEDED FOR SLEEP What changed: See the new instructions.        Allergies  Allergen Reactions   Sulfa Antibiotics Swelling and Other (See Comments)    Swelling in ankles  Swelling in ankles   Advil [Ibuprofen] Other (See Comments)    "Dots on chest" (Petechiae)   Aleve [Naproxen] Other (See Comments)    "Dots on chest" (Petechiae)   Betadine [Povidone Iodine] Itching and Rash   Chloroxylenol (Antiseptic) Itching, Rash and Other (See Comments)    PCMX surgical sterilizing scrub   Poison Ivy Extract Rash   Povidone-Iodine Itching and Rash    BETADINE    Consultations: Gastroenterology Interventional radiology   Procedures/Studies: CT CHEST W CONTRAST  Result Date: 06/25/2021 CLINICAL DATA:  Pancreatic cancer, staging EXAM: CT CHEST WITH CONTRAST TECHNIQUE: Multidetector CT imaging of the chest was performed during intravenous contrast administration. CONTRAST:  56m OMNIPAQUE IOHEXOL 350 MG/ML SOLN COMPARISON:  06/11/2021 FINDINGS: Cardiovascular: The heart and great vessels are unremarkable without pericardial effusion. Ectasia of the ascending thoracic aorta measuring up to 3.9 cm, without aneurysm or dissection. Moderate atherosclerosis of the aortic arch and coronary vasculature. Mediastinum/Nodes: No enlarged mediastinal, hilar, or axillary lymph nodes. Thyroid gland, trachea, and esophagus demonstrate no significant findings. Lungs/Pleura: No acute airspace disease, effusion, or pneumothorax. Minimal hypoventilatory changes within the dependent lower lobes. Central airways are patent. 8 x 6 x 7 mm nodule within the superior segment left lower lobe reference image 65/5. No other pulmonary nodules or masses. Upper Abdomen: Interval biliary duct stent placement. Persistent intrahepatic biliary duct dilation, with pneumobilia  compatible with recent intervention. Please refer to MRI report describing obstructing mass at the porta hepatis. Musculoskeletal: No acute or destructive bony lesions. Loop recorder left anterior chest wall. Reconstructed images demonstrate no additional findings. IMPRESSION: 1. Indeterminate left lower lobe pulmonary nodule with mean diameter of 7 mm. Metastatic disease cannot be excluded, and close attention on follow-up is recommended. 2. Ectasia of the ascending thoracic aorta without frank aneurysm, maximal diameter 3.9 cm. 3. Interval placement of a common bile duct stent, with pneumobilia and persistent intrahepatic duct dilation as above. 4.  Aortic Atherosclerosis (ICD10-I70.0). Electronically Signed   By: MRanda NgoM.D.   On: 06/25/2021 20:11   CT ABDOMEN PELVIS W CONTRAST  Result Date: 07/10/2021 CLINICAL DATA:  Abdominal abscess/infection. Altered mental status. Biliary tumor, likely cholangiocarcinoma. Jaundice. EXAM: CT ABDOMEN AND PELVIS WITH CONTRAST TECHNIQUE: Multidetector CT imaging of the abdomen and pelvis was performed using the standard protocol following bolus administration of intravenous contrast. CONTRAST:  885mOMNIPAQUE IOHEXOL 350 MG/ML SOLN COMPARISON:  06/11/2021 and MRI from 06/23/2021 FINDINGS: Lower chest: Mild dependent atelectasis in both lower lobes. Descending thoracic aortic atherosclerotic calcification. Hepatobiliary: There has been placement of a biliary stent which extends into the left bile duct. Interval decompression of the left biliary tree although there is continued moderate biliary dilatation in the right hepatic lobe. Indistinct tissue planes along the  confluence of the right and left hepatic lobes suspicious for Klatskin tumor. Contracted gallbladder containing high density material compatible with contrast medium. 1.1 by 0.9 cm cyst in segment 2 of the liver. Nonspecific 0.6 cm hypodense lesion in segment 4a. Probable 1.2 by 0.8 cm cyst in segment 4b of  the liver on image 28 series 2, versus a dilated biliary element. Nonspecific 0.3 cm hypodense lesion posteriorly in the right hepatic lobe on image 31 series 2. Nonspecific hypodense peripheral lesion in the right hepatic lobe measuring 1.3 by 1.0 cm on image 18 series 2, although previously characterized as a hemangioma. Pancreas: Unremarkable Spleen: Unremarkable Adrenals/Urinary Tract: 5.1 by 4.3 cm Bosniak category 1 cyst of the right kidney lower pole. Distal ureters in urinary bladder obscured by streak artifact from the patient's hip implants. 0.6 cm stone or cluster of stones in the left kidney lower pole on image 69 series 4. Adrenal glands unremarkable. Stomach/Bowel: Prominent stool throughout the colon favors constipation. Vascular/Lymphatic: Aortoiliac atherosclerotic vascular disease. Reproductive: Brachytherapy seeds in the prostate gland, otherwise obscured by streak artifact from the hip implants. Hypodense lesion in the right scrotum measuring up to 2.9 cm in diameter, possibly an epididymal cyst or spermatocele but not fully characterized on today's exam. Other: No intra-abdominal abscess is identified. Musculoskeletal: Hernia mesh markers along the lower anterior abdominal wall. Bilateral total hip prostheses, a 1.3 by 0.9 cm lucency along the upper margin of the right acetabular shell component is nonspecific but could conceivably reflect early particulate disease. Slightly exaggerated posterior angulation of the left acetabular shell component. Old healed left posterior rib fractures. Multilevel lumbar spondylosis and degenerative disc disease with resulting lumbar impingement. IMPRESSION: 1. Interval placement of a biliary stent extending from the duodenum into the left biliary system. There has been decompression of the left biliary system but the right hepatic lobe biliary system is still moderately dilated. 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 2. Several small hypodense lesions  in the liver are probably cysts or similar benign lesions. One of these has been previously characterized as a hemangioma. Surveillance suggested. 3. No compelling findings of distant metastatic spread. 4.  Prominent stool throughout the colon favors constipation. 5. Other imaging findings of potential clinical significance: Minimal atelectasis in both lower lobes. Aortic Atherosclerosis (ICD10-I70.0). Nonobstructive left nephrolithiasis. Brachytherapy seeds in the prostate gland. Possible right epididymal cyst or spermatocele. Lumbar spondylosis and degenerative disc disease causing multilevel impingement. Electronically Signed   By: Van Clines M.D.   On: 07/10/2021 12:48   MR 3D Recon At Scanner  Result Date: 06/23/2021 CLINICAL DATA:  Obstructive jaundice and leukocytosis. Intrahepatic biliary ductal dilatation with concern for centrally obstructing biliary mass on recent CT. Planned for ERCP this evening. EXAM: MRI ABDOMEN WITHOUT AND WITH CONTRAST (INCLUDING MRCP) TECHNIQUE: Multiplanar multisequence MR imaging of the abdomen was performed both before and after the administration of intravenous contrast. Heavily T2-weighted images of the biliary and pancreatic ducts were obtained, and three-dimensional MRCP images were rendered by post processing. CONTRAST:  67m GADAVIST GADOBUTROL 1 MMOL/ML IV SOLN COMPARISON:  06/11/2021 CT abdomen/pelvis. FINDINGS: Lower chest: No acute abnormality at the lung bases. Hepatobiliary: Normal liver size and configuration. No hepatic steatosis. There is a 1.0 cm peripheral right liver dome hemangioma (series 26/image 26) with progressive discontinuous peripheral nodular enhancement. A few small lobulated and otherwise simple liver cysts are scattered in anterior left liver, largest 1.3 cm in the segment 4 left liver (series 5/image 18). There is marked  diffuse intrahepatic biliary ductal dilatation with abrupt caliber transition at the biliary hilum. Suggestion of a  poorly marginated enhancing obstructing 2.1 x 1.4 cm soft tissue mass at the biliary hilum (series 5/image 16) with associated mildly restricted diffusion on series 3/image 52. Common bile duct diameter 3 mm. No choledocholithiasis. Gallbladder is contracted with no gallstones. No definite gallbladder wall thickening accounting for contracted state. Pancreas: No pancreatic mass or duct dilation.  No pancreas divisum. Spleen: Normal size. No mass. Adrenals/Urinary Tract: Normal adrenals. No hydronephrosis. Scattered simple bilateral renal cortical cysts, largest 4.6 cm in the posterior lower right kidney. No suspicious renal masses. Stomach/Bowel: Normal non-distended stomach. Visualized small and large bowel is normal caliber, with no bowel wall thickening. Vascular/Lymphatic: Atherosclerotic nonaneurysmal abdominal aorta. Patent portal, splenic, hepatic and renal veins. Mildly enlarged 1.4 cm short axis diameter additional porta hepatis node (series 5/image 19). No pathologically enlarged lymph nodes in the abdomen. Other: No abdominal ascites or focal fluid collection. Musculoskeletal: No aggressive appearing focal osseous lesions. IMPRESSION: 1. Marked diffuse intrahepatic biliary ductal dilatation with abrupt caliber transition at the biliary hilum. Suggestion of a poorly marginated enhancing 2.1 x 1.4 cm soft tissue mass at the biliary hilum with associated mildly restricted diffusion. 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 ERCP with brush biopsy correlation already planned by report. 2. Mild porta hepatis lymphadenopathy, indeterminate for metastatic disease. Electronically Signed   By: Ilona Sorrel M.D.   On: 06/23/2021 10:48   DG Chest Port 1 View  Result Date: 07/10/2021 CLINICAL DATA:  Questionable sepsis EXAM: PORTABLE CHEST 1 VIEW COMPARISON:  03/18/2016 FINDINGS: Normal heart size and mediastinal contours. There is no edema, consolidation, effusion, or  pneumothorax. Subcutaneous implantable cardiac device. IMPRESSION: No evidence of active disease. Electronically Signed   By: Monte Fantasia M.D.   On: 07/10/2021 09:39   DG ERCP BILIARY & PANCREATIC DUCTS  Result Date: 06/24/2021 CLINICAL DATA:  Obstructive jaundice, leukocytosis and possible Klatskin tumor. EXAM: ERCP TECHNIQUE: Multiple spot images obtained with the fluoroscopic device and submitted for interpretation post-procedure. FLUOROSCOPY TIME:  Fluoroscopy Time:  11 minutes 3 seconds Number of Acquired Spot Images: 0 COMPARISON:  None. FINDINGS: A total of 27 spot images are submitted for review. The images demonstrate a flexible duodenal scope in the descending duodenum with wire cannulation of both the pancreatic and common bile duct. Cholangiogram is performed. There is no significant dilation of the common bile duct. The cystic duct is patent with contrast material entering the gallbladder lumen. Diffuse dilation of the intrahepatic ducts present. On the final images, both pancreatic and biliary duct plastic stents have been placed. IMPRESSION: 1. Intrahepatic biliary ductal dilatation without evidence of common bile duct dilation suggesting obstruction at the level of the hepatic confluence. 2. Placement of plastic pancreatic and biliary stents. These images were submitted for radiologic interpretation only. Please see the procedural report for the amount of contrast and the fluoroscopy time utilized. Electronically Signed   By: Jacqulynn Cadet M.D.   On: 06/24/2021 07:57   MR ABDOMEN MRCP W WO CONTAST  Result Date: 06/23/2021 CLINICAL DATA:  Obstructive jaundice and leukocytosis. Intrahepatic biliary ductal dilatation with concern for centrally obstructing biliary mass on recent CT. Planned for ERCP this evening. EXAM: MRI ABDOMEN WITHOUT AND WITH CONTRAST (INCLUDING MRCP) TECHNIQUE: Multiplanar multisequence MR imaging of the abdomen was performed both before and after the administration  of intravenous contrast. Heavily T2-weighted images of the biliary and pancreatic ducts were obtained, and  three-dimensional MRCP images were rendered by post processing. CONTRAST:  92m GADAVIST GADOBUTROL 1 MMOL/ML IV SOLN COMPARISON:  06/11/2021 CT abdomen/pelvis. FINDINGS: Lower chest: No acute abnormality at the lung bases. Hepatobiliary: Normal liver size and configuration. No hepatic steatosis. There is a 1.0 cm peripheral right liver dome hemangioma (series 26/image 26) with progressive discontinuous peripheral nodular enhancement. A few small lobulated and otherwise simple liver cysts are scattered in anterior left liver, largest 1.3 cm in the segment 4 left liver (series 5/image 18). There is marked diffuse intrahepatic biliary ductal dilatation with abrupt caliber transition at the biliary hilum. Suggestion of a poorly marginated enhancing obstructing 2.1 x 1.4 cm soft tissue mass at the biliary hilum (series 5/image 16) with associated mildly restricted diffusion on series 3/image 52. Common bile duct diameter 3 mm. No choledocholithiasis. Gallbladder is contracted with no gallstones. No definite gallbladder wall thickening accounting for contracted state. Pancreas: No pancreatic mass or duct dilation.  No pancreas divisum. Spleen: Normal size. No mass. Adrenals/Urinary Tract: Normal adrenals. No hydronephrosis. Scattered simple bilateral renal cortical cysts, largest 4.6 cm in the posterior lower right kidney. No suspicious renal masses. Stomach/Bowel: Normal non-distended stomach. Visualized small and large bowel is normal caliber, with no bowel wall thickening. Vascular/Lymphatic: Atherosclerotic nonaneurysmal abdominal aorta. Patent portal, splenic, hepatic and renal veins. Mildly enlarged 1.4 cm short axis diameter additional porta hepatis node (series 5/image 19). No pathologically enlarged lymph nodes in the abdomen. Other: No abdominal ascites or focal fluid collection. Musculoskeletal: No  aggressive appearing focal osseous lesions. IMPRESSION: 1. Marked diffuse intrahepatic biliary ductal dilatation with abrupt caliber transition at the biliary hilum. Suggestion of a poorly marginated enhancing 2.1 x 1.4 cm soft tissue mass at the biliary hilum with associated mildly restricted diffusion. 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 ERCP with brush biopsy correlation already planned by report. 2. Mild porta hepatis lymphadenopathy, indeterminate for metastatic disease. Electronically Signed   By: JIlona SorrelM.D.   On: 06/23/2021 10:48   CUP PACEART REMOTE DEVICE CHECK  Result Date: 06/24/2021 ILR summary report received. Battery status OK. Normal device function. No new symptom, brady, or pause episodes. No new AF episodes. One previously viewed and reviewed tachycardia episode.  Monthly summary reports and ROV/PRN MKathy Breach RN, CCDS, [employer]  (Echo, Carotid, EGD, Colonoscopy, ERCP)    Subjective: Patient seen and examined.  No overnight events.  Denies any nausea or vomiting.  Eager to go home.  Denies any abdominal pain. He tells me that lactulose was very good for his constipation and wants some prescription.  He is very much looking forward to go to follow-up at GI surgeon this week to discuss any treatment options. He walked with physical therapy, but declined to be needing further treatments at home.    Discharge Exam: Vitals:   07/12/21 2037 07/13/21 0330  BP: 128/65 129/70  Pulse: 81 70  Resp: 17 19  Temp: 98.4 F (36.9 C) 97.6 F (36.4 C)  SpO2: 99% 100%   Vitals:   07/12/21 0405 07/12/21 1401 07/12/21 2037 07/13/21 0330  BP: 103/66 107/61 128/65 129/70  Pulse: 69 79 81 70  Resp: '18 17 17 19  '$ Temp: 97.6 F (36.4 C) 98.1 F (36.7 C) 98.4 F (36.9 C) 97.6 F (36.4 C)  TempSrc: Oral Oral Oral Oral  SpO2: 100% 99% 99% 100%  Weight:      Height:        General: Pt is alert, awake,  not in acute  distress Mildly icteric. Cardiovascular: RRR, S1/S2 +, no rubs, no gallops Respiratory: CTA bilaterally, no wheezing, no rhonchi Abdominal: Soft, NT, ND, bowel sounds + Extremities: no edema, no cyanosis    The results of significant diagnostics from this hospitalization (including imaging, microbiology, ancillary and laboratory) are listed below for reference.     Microbiology: Recent Results (from the past 240 hour(s))  Resp Panel by RT-PCR (Flu A&B, Covid) Nasopharyngeal Swab     Status: None   Collection Time: 07/10/21  9:11 AM   Specimen: Nasopharyngeal Swab; Nasopharyngeal(NP) swabs in vial transport medium  Result Value Ref Range Status   SARS Coronavirus 2 by RT PCR NEGATIVE NEGATIVE Final    Comment: (NOTE) SARS-CoV-2 target nucleic acids are NOT DETECTED.  The SARS-CoV-2 RNA is generally detectable in upper respiratory specimens during the acute phase of infection. The lowest concentration of SARS-CoV-2 viral copies this assay can detect is 138 copies/mL. A negative result does not preclude SARS-Cov-2 infection and should not be used as the sole basis for treatment or other patient management decisions. A negative result may occur with  improper specimen collection/handling, submission of specimen other than nasopharyngeal swab, presence of viral mutation(s) within the areas targeted by this assay, and inadequate number of viral copies(<138 copies/mL). A negative result must be combined with clinical observations, patient history, and epidemiological information. The expected result is Negative.  Fact Sheet for Patients:  EntrepreneurPulse.com.au  Fact Sheet for Healthcare Providers:  IncredibleEmployment.be  This test is no t yet approved or cleared by the [place] FDA and  has been authorized for detection and/or diagnosis of SARS-CoV-2 by FDA under an Emergency Use Authorization (EUA). This EUA will remain  in effect  (meaning this test can be used) for the duration of the COVID-19 declaration under Section 564(b)(1) of the Act, 21 U.S.C.section 360bbb-3(b)(1), unless the authorization is terminated  or revoked sooner.       Influenza A by PCR NEGATIVE NEGATIVE Final   Influenza B by PCR NEGATIVE NEGATIVE Final    Comment: (NOTE) The Xpert Xpress SARS-CoV-2/FLU/RSV plus assay is intended as an aid in the diagnosis of influenza from Nasopharyngeal swab specimens and should not be used as a sole basis for treatment. Nasal washings and aspirates are unacceptable for Xpert Xpress SARS-CoV-2/FLU/RSV testing.  Fact Sheet for Patients: EntrepreneurPulse.com.au  Fact Sheet for Healthcare Providers: IncredibleEmployment.be  This test is not yet approved or cleared by the [place] FDA and has been authorized for detection and/or diagnosis of SARS-CoV-2 by FDA under an Emergency Use Authorization (EUA). This EUA will remain in effect (meaning this test can be used) for the duration of the COVID-19 declaration under Section 564(b)(1) of the Act, 21 U.S.C. section 360bbb-3(b)(1), unless the authorization is terminated or revoked.  Performed at [facility], [place] [address], [place], [place] [address]   Blood Culture (routine x 2)     Status: None (Preliminary result)   Collection Time: 07/10/21  9:55 AM   Specimen: BLOOD RIGHT WRIST  Result Value Ref Range Status   Specimen Description   Final    BLOOD RIGHT WRIST Performed at [facility], [address], [place], [place] [address]    Special Requests   Final    BOTTLES DRAWN AEROBIC AND ANAEROBIC Blood Culture results may not be optimal due to an inadequate volume of blood received in culture bottles Performed at [place] [address] [place], [place]  [address]    Culture   Final    NO GROWTH 2 DAYS Performed at [facility], [place] [address],  [place], [place] [address]    Report Status PENDING  Incomplete  Blood Culture (routine x 2)     Status: None (Preliminary result)   Collection Time: 07/10/21 10:05 AM   Specimen: BLOOD  Result Value Ref Range Status   Specimen Description   Final    BLOOD RIGHT ANTECUBITAL Performed at [place] [address], [place], [place] [address]    Special Requests   Final    BOTTLES DRAWN AEROBIC AND ANAEROBIC Blood Culture results may not be optimal due to an inadequate volume of blood received in culture bottles Performed at [place] [address], [place], [place] [address]    Culture   Final    NO GROWTH 2 DAYS Performed at [place] [address], [place], [place] [address]    Report Status PENDING  Incomplete  Urine Culture     Status: Abnormal   Collection Time: 07/10/21  2:02 PM   Specimen: In/Out Cath Urine  Result Value Ref Range Status   Specimen Description   Final    IN/OUT CATH URINE Performed at [facility], [place] [address], [place], [place] [address]    Special Requests   Final    NONE Performed at [facility], [place] [address], [place], [place] [address]    Culture 5,000 COLONIES/mL YEAST (A)  Final   Report Status 07/12/2021 FINAL  Final     Labs: BNP (last 3 results) No results for input(s): BNP in the last 8760 hours. Basic Metabolic Panel: Recent Labs  Lab 07/10/21 1004 07/10/21 1021 07/11/21 0340 07/12/21 0247 07/13/21 0329  NA 131* 133* 133* 132* 137  K 3.8 3.9 3.6 3.5 3.4*  CL 99 99 103 104 107  CO2 24  --  22 21* 21*  GLUCOSE 95 95 85 86 87  BUN '15 12 13 12 12  '$ CREATININE 0.80 0.60* 0.67 0.58* 0.55*  CALCIUM 8.9  --  8.5* 8.1* 8.7*  MG  --   --   --  2.0  --   PHOS  --   --   --  2.5  --    Liver Function Tests: Recent Labs  Lab 07/10/21 1004 07/11/21 0340 07/12/21 0247 07/13/21 0329  AST 94* 73* 92* 95*  ALT 113* 86* 90* 89*  ALKPHOS 845*  731* 874* 1,026*  BILITOT 7.5* 6.0* 5.6* 4.8*  PROT 6.6 5.8* 5.6* 5.7*  ALBUMIN 2.5* 2.2* 2.0* 2.2*   Recent Labs  Lab 07/10/21 1004  LIPASE 27   Recent Labs  Lab 07/10/21 1004  AMMONIA 50*   CBC: Recent Labs  Lab 07/10/21 1004 07/10/21 1021 07/11/21 0340 07/12/21 0247 07/13/21 0329  WBC 10.8*  --  5.6 6.0 7.4  NEUTROABS 9.8*  --   --  4.0 4.8  HGB 9.5* 10.2* 8.9* 8.6* 8.8*  HCT 29.2* 30.0* 27.0* 26.2* 26.7*  MCV 95.1  --  96.4 94.6 93.7  PLT 395  --  333 361 375   Cardiac Enzymes: No results for input(s): CKTOTAL, CKMB, CKMBINDEX, TROPONINI in the last 168 hours. BNP: Invalid input(s): POCBNP CBG: No results for input(s): GLUCAP in the last 168 hours. D-Dimer No results for input(s): DDIMER in the last 72 hours. Hgb A1c No results for input(s): HGBA1C in the last 72 hours.  Lipid Profile No results for input(s): CHOL, HDL, LDLCALC, TRIG, CHOLHDL, LDLDIRECT in the last 72 hours. Thyroid function studies No results for input(s): TSH, T4TOTAL, T3FREE, THYROIDAB in the last 72 hours.  Invalid input(s): FREET3 Anemia work up No results for input(s): VITAMINB12, FOLATE, FERRITIN, TIBC, IRON, RETICCTPCT in the last 72 hours. Urinalysis    Component Value Date/Time   COLORURINE AMBER (A) 07/10/2021 1402   APPEARANCEUR CLEAR 07/10/2021 1402   LABSPEC >1.046 (H) 07/10/2021 1402   PHURINE 7.0 07/10/2021 1402   GLUCOSEU NEGATIVE 07/10/2021 1402   HGBUR NEGATIVE 07/10/2021 1402   HGBUR negative 03/13/2010 1145   BILIRUBINUR NEGATIVE 07/10/2021 1402   BILIRUBINUR 1+ 11/13/2019 1341   KETONESUR NEGATIVE 07/10/2021 1402   PROTEINUR NEGATIVE 07/10/2021 1402   UROBILINOGEN 0.2 11/13/2019 1341   UROBILINOGEN 0.2 03/13/2010 1145   NITRITE NEGATIVE 07/10/2021 1402   LEUKOCYTESUR NEGATIVE 07/10/2021 1402   Sepsis Labs Invalid input(s): PROCALCITONIN,  WBC,  LACTICIDVEN Microbiology Recent Results (from the past 240 hour(s))  Resp Panel by RT-PCR (Flu A&B, Covid)  Nasopharyngeal Swab     Status: None   Collection Time: 07/10/21  9:11 AM   Specimen: Nasopharyngeal Swab; Nasopharyngeal(NP) swabs in vial transport medium  Result Value Ref Range Status   SARS Coronavirus 2 by RT PCR NEGATIVE NEGATIVE Final    Comment: (NOTE) SARS-CoV-2 target nucleic acids are NOT DETECTED.  The SARS-CoV-2 RNA is generally detectable in upper respiratory specimens during the acute phase of infection. The lowest concentration of SARS-CoV-2 viral copies this assay can detect is 138 copies/mL. A negative result does not preclude SARS-Cov-2 infection and should not be used as the sole basis for treatment or other patient management decisions. A negative result may occur with  improper specimen collection/handling, submission of specimen other than nasopharyngeal swab, presence of viral mutation(s) within the areas targeted by this assay, and inadequate number of viral copies(<138 copies/mL). A negative result must be combined with clinical observations, patient history, and epidemiological information. The expected result is Negative.  Fact Sheet for Patients:  EntrepreneurPulse.com.au  Fact Sheet for Healthcare Providers:  IncredibleEmployment.be  This test is no t yet approved or cleared by the [place] FDA and  has been authorized for detection and/or diagnosis of SARS-CoV-2 by FDA under an Emergency Use Authorization (EUA). This EUA will remain  in effect (meaning this test can be used) for the duration of the COVID-19 declaration under Section 564(b)(1) of the Act, 21 U.S.C.section 360bbb-3(b)(1), unless the authorization is terminated  or revoked sooner.       Influenza A by PCR NEGATIVE NEGATIVE Final   Influenza B by PCR NEGATIVE NEGATIVE Final    Comment: (NOTE) The Xpert Xpress SARS-CoV-2/FLU/RSV plus assay is intended as an aid in the diagnosis of influenza from Nasopharyngeal swab specimens and should not be  used as a sole basis for treatment. Nasal washings and aspirates are unacceptable for Xpert Xpress SARS-CoV-2/FLU/RSV testing.  Fact Sheet for Patients: EntrepreneurPulse.com.au  Fact Sheet for Healthcare Providers: IncredibleEmployment.be  This test is not yet approved or cleared by the [place] FDA and has been authorized for detection and/or diagnosis of SARS-CoV-2 by FDA under an Emergency Use Authorization (EUA). This EUA will remain in effect (meaning this test can be used) for the duration of the COVID-19 declaration under Section 564(b)(1) of the Act, 21 U.S.C. section 360bbb-3(b)(1), unless the authorization is terminated or revoked.  Performed at [facility], [place] [address] [place], [place] [address]  Blood Culture (routine x 2)     Status: None (Preliminary result)   Collection Time: 07/10/21  9:55 AM   Specimen: BLOOD RIGHT WRIST  Result Value Ref Range Status   Specimen Description   Final    BLOOD RIGHT WRIST Performed at [facility], [address] [place], [place] [address]    Special Requests   Final    BOTTLES DRAWN AEROBIC AND ANAEROBIC Blood Culture results may not be optimal due to an inadequate volume of blood received in culture bottles Performed at [place] [address] [place], [place] [address]    Culture   Final    NO GROWTH 2 DAYS Performed at [place] [address], [place], [place] [address]    Report Status PENDING  Incomplete  Blood Culture (routine x 2)     Status: None (Preliminary result)   Collection Time: 07/10/21 10:05 AM   Specimen: BLOOD  Result Value Ref Range Status   Specimen Description   Final    BLOOD RIGHT ANTECUBITAL Performed at [place] [address] [place], [place] [address]    Special Requests   Final    BOTTLES DRAWN AEROBIC AND ANAEROBIC Blood Culture results may not be optimal due  to an inadequate volume of blood received in culture bottles Performed at [facility] [address] [place], [place] [address]    Culture   Final    NO GROWTH 2 DAYS Performed at [place] [address] [address], [place], [place] [address]    Report Status PENDING  Incomplete  Urine Culture     Status: Abnormal   Collection Time: 07/10/21  2:02 PM   Specimen: In/Out Cath Urine  Result Value Ref Range Status   Specimen Description   Final    IN/OUT CATH URINE Performed at [facility], [place] [address], [place], [place] [address]    Special Requests   Final    NONE Performed at [facility], [place] [address], [place], [place] [address]    Culture 5,000 COLONIES/mL YEAST (A)  Final   Report Status 07/12/2021 FINAL  Final     Time coordinating discharge:  32 minutes  SIGNED:   Barb Merino, MD  [facility] 07/13/2021, 1:02 PM

## 2021-07-13 NOTE — Plan of Care (Signed)
  Problem: Activity: Goal: Risk for activity intolerance will decrease Outcome: Progressing   Problem: Coping: Goal: Level of anxiety will decrease Outcome: Progressing   Problem: Safety: Goal: Ability to remain free from injury will improve Outcome: Progressing   

## 2021-07-15 ENCOUNTER — Other Ambulatory Visit: Payer: PPO

## 2021-07-15 ENCOUNTER — Ambulatory Visit: Payer: PPO | Admitting: Physician Assistant

## 2021-07-15 LAB — CULTURE, BLOOD (ROUTINE X 2)
Culture: NO GROWTH
Culture: NO GROWTH

## 2021-07-16 DIAGNOSIS — Z8673 Personal history of transient ischemic attack (TIA), and cerebral infarction without residual deficits: Secondary | ICD-10-CM | POA: Diagnosis not present

## 2021-07-16 DIAGNOSIS — C221 Intrahepatic bile duct carcinoma: Secondary | ICD-10-CM | POA: Diagnosis not present

## 2021-07-16 DIAGNOSIS — R110 Nausea: Secondary | ICD-10-CM | POA: Diagnosis not present

## 2021-07-19 ENCOUNTER — Other Ambulatory Visit: Payer: Self-pay | Admitting: Internal Medicine

## 2021-07-19 DIAGNOSIS — I69398 Other sequelae of cerebral infarction: Secondary | ICD-10-CM

## 2021-07-19 DIAGNOSIS — R252 Cramp and spasm: Secondary | ICD-10-CM

## 2021-07-21 NOTE — Progress Notes (Signed)
Carelink Summary Report / Loop Recorder 

## 2021-07-22 ENCOUNTER — Ambulatory Visit: Payer: PPO | Admitting: Internal Medicine

## 2021-07-23 DIAGNOSIS — Z79899 Other long term (current) drug therapy: Secondary | ICD-10-CM | POA: Diagnosis not present

## 2021-07-23 DIAGNOSIS — C221 Intrahepatic bile duct carcinoma: Secondary | ICD-10-CM | POA: Diagnosis not present

## 2021-07-23 DIAGNOSIS — K831 Obstruction of bile duct: Secondary | ICD-10-CM | POA: Diagnosis not present

## 2021-07-24 DIAGNOSIS — C221 Intrahepatic bile duct carcinoma: Secondary | ICD-10-CM | POA: Diagnosis not present

## 2021-07-24 DIAGNOSIS — Z4682 Encounter for fitting and adjustment of non-vascular catheter: Secondary | ICD-10-CM | POA: Diagnosis not present

## 2021-07-24 DIAGNOSIS — Z95828 Presence of other vascular implants and grafts: Secondary | ICD-10-CM | POA: Diagnosis not present

## 2021-07-24 DIAGNOSIS — R9431 Abnormal electrocardiogram [ECG] [EKG]: Secondary | ICD-10-CM | POA: Diagnosis not present

## 2021-07-24 DIAGNOSIS — Z978 Presence of other specified devices: Secondary | ICD-10-CM | POA: Diagnosis not present

## 2021-07-24 DIAGNOSIS — K5909 Other constipation: Secondary | ICD-10-CM | POA: Diagnosis not present

## 2021-07-27 ENCOUNTER — Emergency Department (HOSPITAL_COMMUNITY): Payer: PPO

## 2021-07-27 ENCOUNTER — Encounter (HOSPITAL_COMMUNITY): Payer: Self-pay

## 2021-07-27 ENCOUNTER — Inpatient Hospital Stay (HOSPITAL_COMMUNITY)
Admission: EM | Admit: 2021-07-27 | Discharge: 2021-08-03 | DRG: 919 | Disposition: A | Discharge status: Other Acute Inpatient Hospital | Payer: PPO | Attending: Internal Medicine | Admitting: Internal Medicine

## 2021-07-27 DIAGNOSIS — Z87891 Personal history of nicotine dependence: Secondary | ICD-10-CM | POA: Diagnosis not present

## 2021-07-27 DIAGNOSIS — C221 Intrahepatic bile duct carcinoma: Secondary | ICD-10-CM | POA: Diagnosis present

## 2021-07-27 DIAGNOSIS — Y828 Other medical devices associated with adverse incidents: Secondary | ICD-10-CM | POA: Diagnosis present

## 2021-07-27 DIAGNOSIS — K8309 Other cholangitis: Secondary | ICD-10-CM | POA: Diagnosis not present

## 2021-07-27 DIAGNOSIS — F39 Unspecified mood [affective] disorder: Secondary | ICD-10-CM | POA: Diagnosis not present

## 2021-07-27 DIAGNOSIS — Z20822 Contact with and (suspected) exposure to covid-19: Secondary | ICD-10-CM | POA: Diagnosis not present

## 2021-07-27 DIAGNOSIS — I10 Essential (primary) hypertension: Secondary | ICD-10-CM | POA: Diagnosis present

## 2021-07-27 DIAGNOSIS — Z049 Encounter for examination and observation for unspecified reason: Secondary | ICD-10-CM | POA: Diagnosis not present

## 2021-07-27 DIAGNOSIS — Z96643 Presence of artificial hip joint, bilateral: Secondary | ICD-10-CM | POA: Diagnosis present

## 2021-07-27 DIAGNOSIS — T85898A Other specified complication of other internal prosthetic devices, implants and grafts, initial encounter: Secondary | ICD-10-CM | POA: Diagnosis not present

## 2021-07-27 DIAGNOSIS — R109 Unspecified abdominal pain: Secondary | ICD-10-CM | POA: Diagnosis not present

## 2021-07-27 DIAGNOSIS — E876 Hypokalemia: Secondary | ICD-10-CM | POA: Diagnosis not present

## 2021-07-27 DIAGNOSIS — Z886 Allergy status to analgesic agent status: Secondary | ICD-10-CM

## 2021-07-27 DIAGNOSIS — R1011 Right upper quadrant pain: Secondary | ICD-10-CM | POA: Diagnosis not present

## 2021-07-27 DIAGNOSIS — Z8546 Personal history of malignant neoplasm of prostate: Secondary | ICD-10-CM | POA: Diagnosis not present

## 2021-07-27 DIAGNOSIS — Z882 Allergy status to sulfonamides status: Secondary | ICD-10-CM | POA: Diagnosis not present

## 2021-07-27 DIAGNOSIS — Z7982 Long term (current) use of aspirin: Secondary | ICD-10-CM

## 2021-07-27 DIAGNOSIS — Z800 Family history of malignant neoplasm of digestive organs: Secondary | ICD-10-CM

## 2021-07-27 DIAGNOSIS — Z79899 Other long term (current) drug therapy: Secondary | ICD-10-CM

## 2021-07-27 DIAGNOSIS — R509 Fever, unspecified: Secondary | ICD-10-CM | POA: Diagnosis not present

## 2021-07-27 DIAGNOSIS — G8194 Hemiplegia, unspecified affecting left nondominant side: Secondary | ICD-10-CM | POA: Diagnosis not present

## 2021-07-27 DIAGNOSIS — Z8249 Family history of ischemic heart disease and other diseases of the circulatory system: Secondary | ICD-10-CM | POA: Diagnosis not present

## 2021-07-27 DIAGNOSIS — I69354 Hemiplegia and hemiparesis following cerebral infarction affecting left non-dominant side: Secondary | ICD-10-CM | POA: Diagnosis not present

## 2021-07-27 DIAGNOSIS — I63411 Cerebral infarction due to embolism of right middle cerebral artery: Secondary | ICD-10-CM | POA: Diagnosis not present

## 2021-07-27 DIAGNOSIS — C801 Malignant (primary) neoplasm, unspecified: Secondary | ICD-10-CM | POA: Diagnosis not present

## 2021-07-27 DIAGNOSIS — K831 Obstruction of bile duct: Secondary | ICD-10-CM | POA: Diagnosis present

## 2021-07-27 DIAGNOSIS — E785 Hyperlipidemia, unspecified: Secondary | ICD-10-CM | POA: Diagnosis not present

## 2021-07-27 DIAGNOSIS — Z66 Do not resuscitate: Secondary | ICD-10-CM | POA: Diagnosis not present

## 2021-07-27 DIAGNOSIS — Z9889 Other specified postprocedural states: Secondary | ICD-10-CM | POA: Diagnosis not present

## 2021-07-27 DIAGNOSIS — K5900 Constipation, unspecified: Secondary | ICD-10-CM | POA: Diagnosis not present

## 2021-07-27 DIAGNOSIS — I69334 Monoplegia of upper limb following cerebral infarction affecting left non-dominant side: Secondary | ICD-10-CM | POA: Diagnosis not present

## 2021-07-27 DIAGNOSIS — F329 Major depressive disorder, single episode, unspecified: Secondary | ICD-10-CM | POA: Diagnosis not present

## 2021-07-27 DIAGNOSIS — R945 Abnormal results of liver function studies: Secondary | ICD-10-CM | POA: Diagnosis not present

## 2021-07-27 DIAGNOSIS — R932 Abnormal findings on diagnostic imaging of liver and biliary tract: Secondary | ICD-10-CM | POA: Diagnosis not present

## 2021-07-27 DIAGNOSIS — M25511 Pain in right shoulder: Secondary | ICD-10-CM | POA: Diagnosis not present

## 2021-07-27 DIAGNOSIS — D72829 Elevated white blood cell count, unspecified: Secondary | ICD-10-CM | POA: Diagnosis not present

## 2021-07-27 DIAGNOSIS — R1084 Generalized abdominal pain: Secondary | ICD-10-CM | POA: Diagnosis not present

## 2021-07-27 DIAGNOSIS — Z888 Allergy status to other drugs, medicaments and biological substances status: Secondary | ICD-10-CM

## 2021-07-27 LAB — COMPREHENSIVE METABOLIC PANEL
ALT: 64 U/L — ABNORMAL HIGH (ref 0–44)
AST: 49 U/L — ABNORMAL HIGH (ref 15–41)
Albumin: 3.2 g/dL — ABNORMAL LOW (ref 3.5–5.0)
Alkaline Phosphatase: 937 U/L — ABNORMAL HIGH (ref 38–126)
Anion gap: 10 (ref 5–15)
BUN: 14 mg/dL (ref 8–23)
CO2: 26 mmol/L (ref 22–32)
Calcium: 9.2 mg/dL (ref 8.9–10.3)
Chloride: 100 mmol/L (ref 98–111)
Creatinine, Ser: 0.63 mg/dL (ref 0.61–1.24)
GFR, Estimated: >60 mL/min (ref >60)
Glucose, Bld: 124 mg/dL — ABNORMAL HIGH (ref 70–99)
Potassium: 4.3 mmol/L (ref 3.5–5.1)
Sodium: 136 mmol/L (ref 135–145)
Total Bilirubin: 3.9 mg/dL — ABNORMAL HIGH (ref 0.3–1.2)
Total Protein: 7.5 g/dL (ref 6.5–8.1)

## 2021-07-27 LAB — CBC WITH DIFFERENTIAL/PLATELET
Abs Immature Granulocytes: 0.05 10*3/uL (ref 0.00–0.07)
Basophils Absolute: 0.1 10*3/uL (ref 0.0–0.1)
Basophils Relative: 0 %
Eosinophils Absolute: 0.2 10*3/uL (ref 0.0–0.5)
Eosinophils Relative: 1 %
HCT: 33.8 % — ABNORMAL LOW (ref 39.0–52.0)
Hemoglobin: 11.1 g/dL — ABNORMAL LOW (ref 13.0–17.0)
Immature Granulocytes: 0 %
Lymphocytes Relative: 3 %
Lymphs Abs: 0.5 10*3/uL — ABNORMAL LOW (ref 0.7–4.0)
MCH: 30.7 pg (ref 26.0–34.0)
MCHC: 32.8 g/dL (ref 30.0–36.0)
MCV: 93.6 fL (ref 80.0–100.0)
Monocytes Absolute: 0.9 10*3/uL (ref 0.1–1.0)
Monocytes Relative: 5 %
Neutro Abs: 14.7 10*3/uL — ABNORMAL HIGH (ref 1.7–7.7)
Neutrophils Relative %: 91 %
Platelets: 430 10*3/uL — ABNORMAL HIGH (ref 150–400)
RBC: 3.61 MIL/uL — ABNORMAL LOW (ref 4.22–5.81)
RDW: 15.5 % (ref 11.5–15.5)
WBC: 16.3 10*3/uL — ABNORMAL HIGH (ref 4.0–10.5)
nRBC: 0 % (ref 0.0–0.2)

## 2021-07-27 LAB — URINALYSIS, ROUTINE W REFLEX MICROSCOPIC
Bacteria, UA: NONE SEEN
Bilirubin Urine: NEGATIVE
Glucose, UA: NEGATIVE mg/dL
Hgb urine dipstick: NEGATIVE
Ketones, ur: NEGATIVE mg/dL
Leukocytes,Ua: NEGATIVE
Nitrite: NEGATIVE
Protein, ur: 30 mg/dL — AB
Specific Gravity, Urine: 1.028 (ref 1.005–1.030)
pH: 5 (ref 5.0–8.0)

## 2021-07-27 LAB — LIPASE, BLOOD: Lipase: 30 U/L (ref 11–51)

## 2021-07-27 LAB — TROPONIN I (HIGH SENSITIVITY): Troponin I (High Sensitivity): 12 ng/L (ref <18)

## 2021-07-27 MED ORDER — IOHEXOL 350 MG/ML SOLN
80.0000 mL | Freq: Once | INTRAVENOUS | Status: AC | PRN
  Administered 2021-07-27: 80 mL via INTRAVENOUS

## 2021-07-27 MED ORDER — SODIUM CHLORIDE 0.9 % IV BOLUS
1000.0000 mL | Freq: Once | INTRAVENOUS | Status: AC
  Administered 2021-07-27: 1000 mL via INTRAVENOUS

## 2021-07-27 MED ORDER — HYDROMORPHONE HCL 1 MG/ML IJ SOLN
1.0000 mg | Freq: Once | INTRAMUSCULAR | Status: AC
  Administered 2021-07-27: 1 mg via INTRAVENOUS
  Filled 2021-07-27: qty 1

## 2021-07-27 NOTE — ED Triage Notes (Signed)
Patient arrives via EMS from home. Patient reports sudden onset of pain in upper right shoulder, pt became diaphoretic and had to be put in bed. Pt reports pain radiates down right side. 300  mcg fentanyl given en route. Pt rates pain 6/10.   EMS Vitals: BP 190/98 HR 84 SPO2 98% RA RR 16 20 g RH

## 2021-07-27 NOTE — ED Notes (Signed)
Pt requests to contact his wife Lyndel Pleasure for an update. [phone]

## 2021-07-27 NOTE — ED Provider Notes (Signed)
[facility] DEPT Provider Note   CSN: [id number] Arrival date & time: 07/27/21  2046     History No chief complaint on file.   Ruben Chavez is a 70 y.o. male.  Patient with history of recent diagnosis of cholangiocarcinoma and biliary obstruction status post biliary tube placement at [facility] last week, presents with chief complaint of sudden right upper quadrant pain and right shoulder pain started about 2 hours prior to arrival.  He been doing well prior to symptom onset.  Describes the pain as dull and severe.  EMS describes him as appearing diaphoretic, given 250 mcg of fentanyl on route.  Patient otherwise denies any chest pain.  He states right upper quadrant pain is at the location of his cholecystostomy tube.  He otherwise denies any reports of fevers or cough or vomiting or diarrhea.      Past Medical History:  Diagnosis Date   Elevated PSA    Hyperlipidemia    pt says not anymore   Hypertension    Prostate cancer [facility]) 2013    Patient Active Problem List   Diagnosis Date Noted   SIRS (systemic inflammatory response syndrome) ([place]) 07/10/2021   Obstructive jaundice due to malignant neoplasm ([place]) 06/24/2021   Kl[place]kin's tumor ([place]) 06/24/2021   Malnutrition of moderate degree 06/24/2021   Abnormal abdominal CT scan 06/19/2021   Jaundice 06/19/2021   Weight loss, unintentional 06/19/2021   Bile duct obstruction 06/19/2021   Left hemiparesis ([place]) 03/28/2020   Chronic bilateral low back pain without sciatica 02/25/2020   Spasticity as late effect of cerebrovascular accident (CVA) 02/25/2020   Cognitive and neurobehavioral dysfunction    Hypertensive urgency 01/28/2020   Urinary retention 01/28/2020   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   Stroke (cerebrum) ([place]) -  [name] 01/25/2020   Acute respiratory  failure with hypoxia (H[facility])    Hypotension    Hypokalemia    Hypocalcemia    Encephalopathy acute    Stroke ([place]) 01/24/2020   Middle cerebral artery embolism, right 01/24/2020   Hyperlipidemia 02/21/2019   Impingement syndrome of right shoulder region 02/21/2018   OA (osteoarthritis) of hip 01/18/2018   Prostate cancer ([place]) 03/04/2016   Essential hypertension 11/27/2014   History of colonic polyps 09/26/2014   BACK PAIN, LEFT 12/22/2009   PROSTATE SPECIFIC ANTIGEN, ELEVATED 12/20/2008   LATERAL EPICONDYLITIS 07/15/2008   ACTINIC KERATOSIS, FOREHEAD, LEFT 05/23/2008   WART, LEFT HAND 07/19/2007   ERECTILE DYSFUNCTION, MILD 07/19/2007   HERNIA, BILATERAL INGUINAL W/O OBST/GANGRENE 07/19/2007   MENISCUS TEAR 07/19/2007   Bilateral inguinal hernia 07/19/2007    Past Surgical History:  Procedure Laterality Date   bilateral inguinal hernia  2008   BILIARY BRUSHING  06/23/2021   Procedure: BILIARY BRUSHING;  Surgeon: Irving Copas., MD;  Location: [facility];  Service: Gastroenterology;;   BILIARY DILATION  06/23/2021   Procedure: BILIARY DILATION;  Surgeon: Irving Copas., MD;  Location: [facility];  Service: Gastroenterology;;   BILIARY STENT PLACEMENT N/A 06/23/2021   Procedure: BILIARY STENT PLACEMENT;  Surgeon: Irving Copas., MD;  Location: [facility];  Service: Gastroenterology;  Laterality: N/A;   BIOPSY  06/23/2021   Procedure: BIOPSY;  Surgeon: Rush Landmark Telford Nab., MD;  Location: [facility];  Service: Gastroenterology;;   Kathleen Argue STUDY  01/28/2020   Procedure: BUBBLE STUDY;  Surgeon: [name], MD;  Location: [facility];  Service: Cardiovascular;;   CYST REMOVAL TRUNK  2016   sebaceous cyst   CYSTOSCOPY  04/22/2016   Procedure: CYSTOSCOPY;  Surgeon: [name], MD;  Location: [facility];  Service: Urology;;   ERCP N/A 06/23/2021   Procedure: ENDOSCOPIC RETROGRADE CHOLANGIOPANCREATOGRAPHY (ERCP);  Surgeon:  Irving Copas., MD;  Location: [facility];  Service: Gastroenterology;  Laterality: N/A;   IR ANGIO INTRA EXTRACRAN SEL COM CAROTID INNOMINATE BILAT MOD SED  05/30/2020   IR ANGIO VERTEBRAL SEL SUBCLAVIAN INNOMINATE UNI R MOD SED  01/25/2020   IR ANGIO VERTEBRAL SEL VERTEBRAL BILAT MOD SED  05/30/2020   IR CT HEAD LTD  01/25/2020   IR PERCUTANEOUS ART THROMBECTOMY/INFUSION INTRACRANIAL INC DIAG ANGIO  01/25/2020   IR US GUIDE VASC ACCESS RIGHT  05/30/2020   KNEE ARTHROSCOPY  2007   right   LOOP RECORDER INSERTION N/A 01/28/2020   Procedure: LOOP RECORDER INSERTION;  Surgeon: [name], MD;  Location: [facility];  Service: Cardiovascular;  Laterality: N/A;   PANCREATIC STENT PLACEMENT  06/23/2021   Procedure: PANCREATIC STENT PLACEMENT;  Surgeon: Irving Copas., MD;  Location: [facility];  Service: Gastroenterology;;   PROSTATE BIOPSY  2008   PROSTATE BIOPSY  2013   PROSTATE BIOPSY  01/28/2016   RADIOACTIVE SEED IMPLANT N/A 04/22/2016   Procedure: RADIOACTIVE SEED IMPLANT/BRACHYTHERAPY IMPLANT;  Surgeon: [name], MD;  Location: [facility];  Service: Urology;  Laterality: N/A;   RADIOLOGY WITH ANESTHESIA N/A 01/24/2020   Procedure: IR WITH ANESTHESIA;  Surgeon: [name], MD;  Location: [facility];  Service: Radiology;  Laterality: N/A;   REMOVAL OF STONES  06/23/2021   Procedure: REMOVAL OF STONES;  Surgeon: Rush Landmark Telford Nab., MD;  Location: [facility];  Service: Gastroenterology;;   Joan Mayans  06/23/2021   Procedure: Joan Mayans;  Surgeon: Rush Landmark Telford Nab., MD;  Location: [facility];  Service: Gastroenterology;;   TEE WITHOUT CARDIOVERSION N/A 01/28/2020   Procedure: TRANSESOPHAGEAL ECHOCARDIOGRAM (TEE);  Surgeon: [name], MD;  Location[facility];  Service: Cardiovascular;  Laterality: N/A;   TONSILLECTOMY     TOTAL HIP ARTHROPLASTY  2001   left   TOTAL HIP ARTHROPLASTY Right 01/18/2018   Procedure:  RIGHT TOTAL HIP ARTHROPLASTY ANTERIOR APPROACH;  Surgeon: [name], MD;  Location: [facility];  Service: Orthopedics;  Laterality: Right;       Family History  Problem Relation Age of Onset   Pancreatic cancer Mother        small bowel cancer   Heart disease Father    Colon cancer Neg Hx    Stomach cancer Neg Hx     Social History   Tobacco Use   Smoking status: Former    Packs/day: 1.00    Years: 32.00    Pack years: 32.00    Types: Cigarettes    Quit date: 06/06/1999    Years since quitting: 22.1   Smokeless tobacco: Never  Vaping Use   Vaping Use: Never used  Substance Use Topics   Alcohol use: Not Currently    Alcohol/week: 14.0 standard drinks    Types: 87 Shots of liquor per week   Drug use: No    Home Medications Prior to Admission medications   Medication Sig Start Date End Date Taking? Authorizing Provider  aspirin EC 325 MG tablet Take 325 mg by mouth daily.    [provider]  buPROPion (WELLBUTRIN XL) 300 MG 24 hr tablet TAKE 1 TABLET(300  MG) BY MOUTH DAILY 02/05/21   Isaac Bliss, Rayford Halsted, MD  folic acid (FOLVITE) 1 MG tablet TAKE 1 TABLET(1 MG) BY MOUTH DAILY 02/05/21   Isaac Bliss, Rayford Halsted, MD  lactulose (CHRONULAC) 10 GM/15ML solution Take 30 mLs (20 g total) by mouth 2 (two) times daily as needed for mild constipation or moderate constipation. 07/13/21   Barb Merino, MD  lisinopril-hydrochlorothiazide (ZESTORETIC) 20-25 MG tablet TAKE 1 TABLET BY MOUTH DAILY 05/06/21   Isaac Bliss, Rayford Halsted, MD  Magnesium 250 MG TABS Take 250 mg by mouth every morning.    [provider]  Multiple Vitamin (MULTIVITAMIN WITH MINERALS) TABS tablet Take 1 tablet by mouth daily. 01/29/20   [name], NP  pantoprazole (PROTONIX) 40 MG tablet Take 1 tablet (40 mg total) by mouth daily. 06/04/21   Isaac Bliss, Rayford Halsted, MD  traZODone (DESYREL) 100 MG tablet TAKE 1 TABLET(100 MG) BY MOUTH AT BEDTIME 07/21/21   Isaac Bliss, Rayford Halsted, MD     Allergies    Sulfa antibiotics, Advil [ibuprofen], Aleve [naproxen], Betadine [povidone iodine], Chloroxylenol (antiseptic), Poison ivy extract, and Povidone-iodine  Review of Systems   Review of Systems  Constitutional:  Negative for fever.  HENT:  Negative for ear pain and sore throat.   Eyes:  Negative for pain.  Respiratory:  Negative for cough.   Cardiovascular:  Negative for chest pain.  Gastrointestinal:  Positive for abdominal pain.  Genitourinary:  Negative for flank pain.  Musculoskeletal:  Negative for back pain.  Skin:  Negative for color change and rash.  Neurological:  Negative for syncope.  All other systems reviewed and are negative.  Physical Exam Updated Vital Signs BP (!) 174/83   Pulse 82   Temp 98.7 F (37.1 C) (Oral)   Resp 19   SpO2 100%   Physical Exam Constitutional:      Appearance: He is well-developed.  HENT:     Head: Normocephalic.     Nose: Nose normal.  Eyes:     Extraocular Movements: Extraocular movements intact.  Cardiovascular:     Rate and Rhythm: Normal rate.  Pulmonary:     Effort: Pulmonary effort is normal.  Abdominal:     Comments: Upper quadrant cholecystostomy tube is in place, does not appear attached to a bag.  No tenderness at the site at this time.  Musculoskeletal:     Comments: Patient has no pain with range of motion of the upper extremities.  Neurovascular intact bilateral upper extremities.  Skin:    Coloration: Skin is not jaundiced.  Neurological:     Mental Status: He is alert. Mental status is at baseline.    ED Results / Procedures / Treatments   Labs (all labs ordered are listed, but only abnormal results are displayed) Labs Reviewed  CBC WITH DIFFERENTIAL/PLATELET - Abnormal; Notable for the following components:      Result Value   WBC 16.3 (*)    RBC 3.61 (*)    Hemoglobin 11.1 (*)    HCT 33.8 (*)    Platelets 430 (*)    Neutro Abs 14.7 (*)    Lymphs Abs 0.5 (*)    All other components  within normal limits  COMPREHENSIVE METABOLIC PANEL - Abnormal; Notable for the following components:   Glucose, Bld 124 (*)    Albumin 3.2 (*)    AST 49 (*)    ALT 64 (*)    Alkaline Phosphatase 937 (*)    Total Bilirubin 3.9 (*)  All other components within normal limits  URINALYSIS, ROUTINE W REFLEX MICROSCOPIC - Abnormal; Notable for the following components:   Color, Urine AMBER (*)    Protein, ur 30 (*)    All other components within normal limits  LIPASE, BLOOD  TROPONIN I (HIGH SENSITIVITY)  TROPONIN I (HIGH SENSITIVITY)    EKG EKG Interpretation  Date/Time:  Monday July 27 2021 21:44:40 EDT Ventricular Rate:  83 PR Interval:  161 QRS Duration: 100 QT Interval:  421 QTC Calculation: 495 R Axis:   -17 Text Interpretation: Sinus rhythm Borderline left axis deviation Nonspecific T abnrm, anterolateral leads Minimal ST elevation, inferior leads Borderline prolonged QT interval Confirmed by Thamas Jaegers ([id number]) on 07/27/2021 9:46:53 PM  Radiology No results found.  Procedures Procedures   Medications Ordered in ED Medications  iohexol (OMNIPAQUE) 350 MG/ML injection 80 mL (has no administration in time range)  HYDROmorphone (DILAUDID) injection 1 mg (has no administration in time range)  sodium chloride 0.9 % bolus 1,000 mL (1,000 mLs Intravenous New Bag/Given 07/27/21 2140)    ED Course  I have reviewed the triage vital signs and the nursing notes.  Pertinent labs & imaging results that were available during my care of the patient were reviewed by me and considered in my medical decision making (see chart for details).    MDM Rules/Calculators/A&P                           White count appears elevated here today 16.  Liver enzymes appear to be continue to be elevated.  T bili elevated as well but appears to be trending downwards from prior lab values.  Lab values on August 18 appear to have shown a T bili of 4.4 and alk phos of 1200.  His labs today show T  bili of 3.9 and alk phos of 900.  Given IV Dilaudid for pain management.  CT abdomen pelvis ordered and pending imaging.   Final Clinical Impression(s) / ED Diagnoses Final diagnoses:  Abdominal pain, unspecified abdominal location    Rx / DC Orders ED Discharge Orders     None        Luna Fuse, MD 07/27/21 2304

## 2021-07-28 ENCOUNTER — Telehealth: Payer: Self-pay

## 2021-07-28 ENCOUNTER — Encounter (HOSPITAL_COMMUNITY): Payer: Self-pay | Admitting: Emergency Medicine

## 2021-07-28 DIAGNOSIS — R945 Abnormal results of liver function studies: Secondary | ICD-10-CM

## 2021-07-28 DIAGNOSIS — D72829 Elevated white blood cell count, unspecified: Secondary | ICD-10-CM | POA: Diagnosis not present

## 2021-07-28 DIAGNOSIS — R1011 Right upper quadrant pain: Secondary | ICD-10-CM | POA: Diagnosis present

## 2021-07-28 DIAGNOSIS — I69354 Hemiplegia and hemiparesis following cerebral infarction affecting left non-dominant side: Secondary | ICD-10-CM | POA: Diagnosis not present

## 2021-07-28 DIAGNOSIS — C221 Intrahepatic bile duct carcinoma: Secondary | ICD-10-CM

## 2021-07-28 DIAGNOSIS — R1084 Generalized abdominal pain: Secondary | ICD-10-CM | POA: Diagnosis not present

## 2021-07-28 DIAGNOSIS — Z8546 Personal history of malignant neoplasm of prostate: Secondary | ICD-10-CM | POA: Diagnosis not present

## 2021-07-28 DIAGNOSIS — Z886 Allergy status to analgesic agent status: Secondary | ICD-10-CM | POA: Diagnosis not present

## 2021-07-28 DIAGNOSIS — K831 Obstruction of bile duct: Secondary | ICD-10-CM | POA: Diagnosis present

## 2021-07-28 DIAGNOSIS — Z882 Allergy status to sulfonamides status: Secondary | ICD-10-CM | POA: Diagnosis not present

## 2021-07-28 DIAGNOSIS — I63411 Cerebral infarction due to embolism of right middle cerebral artery: Secondary | ICD-10-CM | POA: Diagnosis not present

## 2021-07-28 DIAGNOSIS — Z87891 Personal history of nicotine dependence: Secondary | ICD-10-CM | POA: Diagnosis not present

## 2021-07-28 DIAGNOSIS — Z9889 Other specified postprocedural states: Secondary | ICD-10-CM | POA: Diagnosis not present

## 2021-07-28 DIAGNOSIS — K8309 Other cholangitis: Secondary | ICD-10-CM

## 2021-07-28 DIAGNOSIS — Z7982 Long term (current) use of aspirin: Secondary | ICD-10-CM | POA: Diagnosis not present

## 2021-07-28 DIAGNOSIS — Z8249 Family history of ischemic heart disease and other diseases of the circulatory system: Secondary | ICD-10-CM | POA: Diagnosis not present

## 2021-07-28 DIAGNOSIS — Z79899 Other long term (current) drug therapy: Secondary | ICD-10-CM | POA: Diagnosis not present

## 2021-07-28 DIAGNOSIS — Y828 Other medical devices associated with adverse incidents: Secondary | ICD-10-CM | POA: Diagnosis present

## 2021-07-28 DIAGNOSIS — I10 Essential (primary) hypertension: Secondary | ICD-10-CM | POA: Diagnosis present

## 2021-07-28 DIAGNOSIS — Z96643 Presence of artificial hip joint, bilateral: Secondary | ICD-10-CM | POA: Diagnosis present

## 2021-07-28 DIAGNOSIS — Z20822 Contact with and (suspected) exposure to covid-19: Secondary | ICD-10-CM | POA: Diagnosis present

## 2021-07-28 DIAGNOSIS — R932 Abnormal findings on diagnostic imaging of liver and biliary tract: Secondary | ICD-10-CM | POA: Diagnosis not present

## 2021-07-28 DIAGNOSIS — F39 Unspecified mood [affective] disorder: Secondary | ICD-10-CM | POA: Diagnosis present

## 2021-07-28 DIAGNOSIS — T85898A Other specified complication of other internal prosthetic devices, implants and grafts, initial encounter: Secondary | ICD-10-CM | POA: Diagnosis present

## 2021-07-28 DIAGNOSIS — E785 Hyperlipidemia, unspecified: Secondary | ICD-10-CM | POA: Diagnosis present

## 2021-07-28 DIAGNOSIS — E876 Hypokalemia: Secondary | ICD-10-CM | POA: Diagnosis not present

## 2021-07-28 DIAGNOSIS — Z800 Family history of malignant neoplasm of digestive organs: Secondary | ICD-10-CM | POA: Diagnosis not present

## 2021-07-28 DIAGNOSIS — Z888 Allergy status to other drugs, medicaments and biological substances status: Secondary | ICD-10-CM | POA: Diagnosis not present

## 2021-07-28 LAB — COMPREHENSIVE METABOLIC PANEL
ALT: 53 U/L — ABNORMAL HIGH (ref 0–44)
AST: 30 U/L (ref 15–41)
Albumin: 3 g/dL — ABNORMAL LOW (ref 3.5–5.0)
Alkaline Phosphatase: 811 U/L — ABNORMAL HIGH (ref 38–126)
Anion gap: 9 (ref 5–15)
BUN: 12 mg/dL (ref 8–23)
CO2: 26 mmol/L (ref 22–32)
Calcium: 9.2 mg/dL (ref 8.9–10.3)
Chloride: 99 mmol/L (ref 98–111)
Creatinine, Ser: 0.66 mg/dL (ref 0.61–1.24)
GFR, Estimated: >60 mL/min (ref >60)
Glucose, Bld: 132 mg/dL — ABNORMAL HIGH (ref 70–99)
Potassium: 4.1 mmol/L (ref 3.5–5.1)
Sodium: 134 mmol/L — ABNORMAL LOW (ref 135–145)
Total Bilirubin: 3.8 mg/dL — ABNORMAL HIGH (ref 0.3–1.2)
Total Protein: 7 g/dL (ref 6.5–8.1)

## 2021-07-28 LAB — CBC WITH DIFFERENTIAL/PLATELET
Abs Immature Granulocytes: 0.22 10*3/uL — ABNORMAL HIGH (ref 0.00–0.07)
Basophils Absolute: 0 10*3/uL (ref 0.0–0.1)
Basophils Relative: 0 %
Eosinophils Absolute: 0 10*3/uL (ref 0.0–0.5)
Eosinophils Relative: 0 %
HCT: 36.7 % — ABNORMAL LOW (ref 39.0–52.0)
Hemoglobin: 11.8 g/dL — ABNORMAL LOW (ref 13.0–17.0)
Immature Granulocytes: 1 %
Lymphocytes Relative: 2 %
Lymphs Abs: 0.5 10*3/uL — ABNORMAL LOW (ref 0.7–4.0)
MCH: 29.9 pg (ref 26.0–34.0)
MCHC: 32.2 g/dL (ref 30.0–36.0)
MCV: 92.9 fL (ref 80.0–100.0)
Monocytes Absolute: 1.1 10*3/uL — ABNORMAL HIGH (ref 0.1–1.0)
Monocytes Relative: 4 %
Neutro Abs: 24.8 10*3/uL — ABNORMAL HIGH (ref 1.7–7.7)
Neutrophils Relative %: 93 %
Platelets: 453 10*3/uL — ABNORMAL HIGH (ref 150–400)
RBC: 3.95 MIL/uL — ABNORMAL LOW (ref 4.22–5.81)
RDW: 15.4 % (ref 11.5–15.5)
WBC: 26.7 10*3/uL — ABNORMAL HIGH (ref 4.0–10.5)
nRBC: 0 % (ref 0.0–0.2)

## 2021-07-28 LAB — RESP PANEL BY RT-PCR (FLU A&B, COVID) ARPGX2
Influenza A by PCR: NEGATIVE
Influenza B by PCR: NEGATIVE
SARS Coronavirus 2 by RT PCR: NEGATIVE

## 2021-07-28 LAB — TROPONIN I (HIGH SENSITIVITY): Troponin I (High Sensitivity): 13 ng/L (ref <18)

## 2021-07-28 LAB — MAGNESIUM: Magnesium: 1.9 mg/dL (ref 1.7–2.4)

## 2021-07-28 MED ORDER — ONDANSETRON HCL 4 MG PO TABS: 4.0000 mg | ORAL_TABLET | Freq: Four times a day (QID) | ORAL | Status: DC | PRN

## 2021-07-28 MED ORDER — SODIUM CHLORIDE 0.9% FLUSH: 10.0000 mL | INTRAVENOUS | Status: DC | PRN

## 2021-07-28 MED ORDER — ENOXAPARIN SODIUM 40 MG/0.4ML IJ SOSY
40.0000 mg | PREFILLED_SYRINGE | INTRAMUSCULAR | Status: DC
  Administered 2021-07-28 – 2021-08-02 (×6): 40 mg via SUBCUTANEOUS
  Filled 2021-07-28 (×6): qty 0.4

## 2021-07-28 MED ORDER — HYDROCHLOROTHIAZIDE 25 MG PO TABS
25.0000 mg | ORAL_TABLET | Freq: Every day | ORAL | Status: DC
  Administered 2021-07-29: 25 mg via ORAL
  Filled 2021-07-28 (×2): qty 1

## 2021-07-28 MED ORDER — TRAZODONE HCL 50 MG PO TABS
100.0000 mg | ORAL_TABLET | Freq: Every day | ORAL | Status: DC
  Administered 2021-07-28 – 2021-08-03 (×8): 100 mg via ORAL
  Filled 2021-07-28 (×3): qty 2
  Filled 2021-07-28: qty 1
  Filled 2021-07-28 (×4): qty 2

## 2021-07-28 MED ORDER — SODIUM CHLORIDE 0.9% FLUSH
3.0000 mL | Freq: Two times a day (BID) | INTRAVENOUS | Status: DC
  Administered 2021-07-28 – 2021-08-03 (×10): 3 mL via INTRAVENOUS

## 2021-07-28 MED ORDER — HYDROMORPHONE HCL 1 MG/ML IJ SOLN
1.0000 mg | INTRAMUSCULAR | Status: DC | PRN
  Administered 2021-07-28 (×2): 1 mg via INTRAVENOUS
  Filled 2021-07-28 (×2): qty 1

## 2021-07-28 MED ORDER — PIPERACILLIN-TAZOBACTAM 3.375 G IVPB
3.3750 g | Freq: Three times a day (TID) | INTRAVENOUS | Status: DC
  Administered 2021-07-28 – 2021-07-31 (×9): 3.375 g via INTRAVENOUS
  Filled 2021-07-28 (×10): qty 50

## 2021-07-28 MED ORDER — SODIUM CHLORIDE 0.9 % IV SOLN: INTRAVENOUS | Status: DC

## 2021-07-28 MED ORDER — CHLORHEXIDINE GLUCONATE CLOTH 2 % EX PADS
6.0000 | MEDICATED_PAD | Freq: Every day | CUTANEOUS | Status: DC
  Administered 2021-07-28 – 2021-08-02 (×6): 6 via TOPICAL

## 2021-07-28 MED ORDER — ACETAMINOPHEN 325 MG PO TABS
650.0000 mg | ORAL_TABLET | Freq: Four times a day (QID) | ORAL | Status: DC | PRN
  Administered 2021-07-28 – 2021-08-03 (×5): 650 mg via ORAL
  Filled 2021-07-28 (×5): qty 2

## 2021-07-28 MED ORDER — VANCOMYCIN HCL IN DEXTROSE 1-5 GM/200ML-% IV SOLN
1000.0000 mg | Freq: Once | INTRAVENOUS | Status: AC
  Administered 2021-07-28: 1000 mg via INTRAVENOUS
  Filled 2021-07-28: qty 200

## 2021-07-28 MED ORDER — HYDROMORPHONE HCL 1 MG/ML IJ SOLN
1.0000 mg | INTRAMUSCULAR | Status: DC | PRN
Start: 2021-07-28 — End: 2021-08-04
  Administered 2021-07-28 – 2021-07-29 (×3): 1 mg via INTRAVENOUS
  Filled 2021-07-28 (×3): qty 1

## 2021-07-28 MED ORDER — LISINOPRIL 20 MG PO TABS
20.0000 mg | ORAL_TABLET | Freq: Every day | ORAL | Status: DC
  Administered 2021-07-28 – 2021-08-03 (×7): 20 mg via ORAL
  Filled 2021-07-28 (×7): qty 1

## 2021-07-28 MED ORDER — HYDRALAZINE HCL 20 MG/ML IJ SOLN
10.0000 mg | Freq: Four times a day (QID) | INTRAMUSCULAR | Status: DC | PRN
  Administered 2021-07-31: 10 mg via INTRAVENOUS
  Filled 2021-07-28: qty 1

## 2021-07-28 MED ORDER — ASPIRIN EC 325 MG PO TBEC
325.0000 mg | DELAYED_RELEASE_TABLET | Freq: Every day | ORAL | Status: DC
  Administered 2021-07-28 – 2021-08-03 (×7): 325 mg via ORAL
  Filled 2021-07-28 (×8): qty 1

## 2021-07-28 MED ORDER — PIPERACILLIN-TAZOBACTAM 3.375 G IVPB 30 MIN
3.3750 g | Freq: Once | INTRAVENOUS | Status: AC
  Administered 2021-07-28: 3.375 g via INTRAVENOUS
  Filled 2021-07-28: qty 50

## 2021-07-28 MED ORDER — MORPHINE SULFATE (PF) 2 MG/ML IV SOLN
2.0000 mg | Freq: Once | INTRAVENOUS | Status: AC
  Administered 2021-07-28: 2 mg via INTRAVENOUS
  Filled 2021-07-28: qty 1

## 2021-07-28 MED ORDER — FOLIC ACID 1 MG PO TABS
1.0000 mg | ORAL_TABLET | Freq: Every day | ORAL | Status: DC
  Administered 2021-07-28 – 2021-08-03 (×7): 1 mg via ORAL
  Filled 2021-07-28 (×7): qty 1

## 2021-07-28 MED ORDER — BACLOFEN 10 MG PO TABS: 10.0000 mg | ORAL_TABLET | Freq: Two times a day (BID) | ORAL | Status: DC | PRN

## 2021-07-28 MED ORDER — PANTOPRAZOLE SODIUM 40 MG PO TBEC
40.0000 mg | DELAYED_RELEASE_TABLET | Freq: Every day | ORAL | Status: DC
  Administered 2021-07-28 – 2021-08-03 (×7): 40 mg via ORAL
  Filled 2021-07-28 (×7): qty 1

## 2021-07-28 MED ORDER — HYDRALAZINE HCL 20 MG/ML IJ SOLN: 2.0000 mg | Freq: Once | INTRAMUSCULAR | Status: DC

## 2021-07-28 MED ORDER — BUPROPION HCL ER (XL) 300 MG PO TB24
300.0000 mg | ORAL_TABLET | Freq: Every day | ORAL | Status: DC
  Administered 2021-07-28 – 2021-08-03 (×7): 300 mg via ORAL
  Filled 2021-07-28 (×7): qty 1

## 2021-07-28 MED ORDER — LISINOPRIL-HYDROCHLOROTHIAZIDE 20-25 MG PO TABS: 1.0000 | ORAL_TABLET | Freq: Every day | ORAL | Status: DC

## 2021-07-28 MED ORDER — BACLOFEN 10 MG PO TABS
10.0000 mg | ORAL_TABLET | Freq: Three times a day (TID) | ORAL | Status: DC | PRN
  Administered 2021-07-28 – 2021-08-01 (×5): 10 mg via ORAL
  Filled 2021-07-28 (×5): qty 1

## 2021-07-28 MED ORDER — ONDANSETRON HCL 4 MG/2ML IJ SOLN: 4.0000 mg | Freq: Four times a day (QID) | INTRAMUSCULAR | Status: DC | PRN

## 2021-07-28 MED ORDER — ACETAMINOPHEN 650 MG RE SUPP: 650.0000 mg | Freq: Four times a day (QID) | RECTAL | Status: DC | PRN

## 2021-07-28 NOTE — Consult Note (Signed)
Consultation  Referring Provider: [facility]/ Rathore MD Primary Care Physician:  Isaac Bliss, Rayford Halsted, MD Primary Gastroenterologist: Mina Marble Tandy Gaw Dr Delrae Alfred  Reason for Consultation: Cholangiocarcinoma, admitted with abrupt onset of right upper quadrant pain.  HPI: Ruben Chavez is a 70 y.o. male who has very recently been diagnosed with cholangiocarcinoma.  He had initial ERCP done by Dr. Rush Landmark 06/23/2021 with finding of a mass at the hepatic duct bifurcation, biliary stent was placed, and brushings were done which were positive for adenocarcinoma. Patient established care at [facility], and after evaluation by surgical oncology on 07/03/2021 was advised to have chemotherapy as first-line management.  He was noted to have persistently elevated bilirubin at that time and was referred to GI at [facility].  He underwent ERCP on 07/07/2021 with finding of a 3 cm stricture at the common hepatic duct, previous stent was removed and the stricture was dilated and then had placement of a 10 French by 12 cm stent to the left intrahepatic system, the right system noted to be completely obliterated. He had persistent hyperbilirubinemia and was referred to IR [facility] and underwent percutaneous drainage of the right system on 07/23/2021.  Placement of a 10 French internal/external drain.  This was The following day. At that time T bili was 4.4/alk phos 1278/AST 94/ALT 95.  Was planned to have repeat ERCP in 1 month.  Yesterday presented to the emergency room here after abrupt onset of right upper quadrant pain and right shoulder pain. Noted to have elevated WBC of 16,000 T bili 3.9/alk phos 937/AST 49/ALT 64. CT of the abdomen and pelvis was done which showed the right-sided intrahepatic dilation to be improved and there was mild increase in the left-sided system dilation raising question of possible obstruction of the indwelling biliary stent.  He has been started on IV and  biotics/Zosyn  Today WBC up to 26,000, hemoglobin 11.8/hematocrit 36.7 T bili 3.8/alk phos 811/AST 30/ALT 53.  He is still having a lot of pain today, says it hurts every time he takes a deep breath, he is also developed hiccups.  Apparently had some hiccups last week and has a prescription for baclofen which he had not needed. Denies any nausea or vomiting and would like some clear liquids.     Past Medical History:  Diagnosis Date   Elevated PSA    Hyperlipidemia    pt says not anymore   Hypertension    Prostate cancer ([place]) 2013    Past Surgical History:  Procedure Laterality Date   bilateral inguinal hernia  2008   BILIARY BRUSHING  06/23/2021   Procedure: BILIARY BRUSHING;  Surgeon: Irving Copas., MD;  Location: [facility];  Service: Gastroenterology;;   BILIARY DILATION  06/23/2021   Procedure: BILIARY DILATION;  Surgeon: Irving Copas., MD;  Location: [facility];  Service: Gastroenterology;;   BILIARY STENT PLACEMENT N/A 06/23/2021   Procedure: BILIARY STENT PLACEMENT;  Surgeon: Irving Copas., MD;  Location: [facility];  Service: Gastroenterology;  Laterality: N/A;   BIOPSY  06/23/2021   Procedure: BIOPSY;  Surgeon: Rush Landmark Telford Nab., MD;  Location: [facility];  Service: Gastroenterology;;   Kathleen Argue STUDY  01/28/2020   Procedure: BUBBLE STUDY;  Surgeon: [name], MD;  Location: [place];  Service: Cardiovascular;;   CYST REMOVAL TRUNK  2016   sebaceous cyst   CYSTOSCOPY  04/22/2016   Procedure: CYSTOSCOPY;  Surgeon: [name], MD;  Location: [facility];  Service: Urology;;   ERCP N/A  06/23/2021   Procedure: ENDOSCOPIC RETROGRADE CHOLANGIOPANCREATOGRAPHY (ERCP);  Surgeon: Irving Copas., MD;  Location: [facility];  Service: Gastroenterology;  Laterality: N/A;   IR ANGIO INTRA EXTRACRAN SEL COM CAROTID INNOMINATE BILAT MOD SED  05/30/2020   IR ANGIO VERTEBRAL SEL SUBCLAVIAN INNOMINATE UNI R  MOD SED  01/25/2020   IR ANGIO VERTEBRAL SEL VERTEBRAL BILAT MOD SED  05/30/2020   IR CT HEAD LTD  01/25/2020   IR PERCUTANEOUS ART THROMBECTOMY/INFUSION INTRACRANIAL INC DIAG ANGIO  01/25/2020   IR US GUIDE VASC ACCESS RIGHT  05/30/2020   KNEE ARTHROSCOPY  2007   right   LOOP RECORDER INSERTION N/A 01/28/2020   Procedure: LOOP RECORDER INSERTION;  Surgeon: [name], MD;  Location: [facility];  Service: Cardiovascular;  Laterality: N/A;   PANCREATIC STENT PLACEMENT  06/23/2021   Procedure: PANCREATIC STENT PLACEMENT;  Surgeon: Irving Copas., MD;  Location: [facility];  Service: Gastroenterology;;   PROSTATE BIOPSY  2008   PROSTATE BIOPSY  2013   PROSTATE BIOPSY  01/28/2016   RADIOACTIVE SEED IMPLANT N/A 04/22/2016   Procedure: RADIOACTIVE SEED IMPLANT/BRACHYTHERAPY IMPLANT;  Surgeon: [name], MD;  Location: [facility];  Service: Urology;  Laterality: N/A;   RADIOLOGY WITH ANESTHESIA N/A 01/24/2020   Procedure: IR WITH ANESTHESIA;  Surgeon: [name], MD;  Location: [place];  Service: Radiology;  Laterality: N/A;   REMOVAL OF STONES  06/23/2021   Procedure: REMOVAL OF STONES;  Surgeon: Rush Landmark Telford Nab., MD;  Location: [facility];  Service: Gastroenterology;;   Joan Mayans  06/23/2021   Procedure: Joan Mayans;  Surgeon: Rush Landmark Telford Nab., MD;  Location: [facility];  Service: Gastroenterology;;   TEE WITHOUT CARDIOVERSION N/A 01/28/2020   Procedure: TRANSESOPHAGEAL ECHOCARDIOGRAM (TEE);  Surgeon: [name], MD;  Location: [facility];  Service: Cardiovascular;  Laterality: N/A;   TONSILLECTOMY     TOTAL HIP ARTHROPLASTY  2001   left   TOTAL HIP ARTHROPLASTY Right 01/18/2018   Procedure: RIGHT TOTAL HIP ARTHROPLASTY ANTERIOR APPROACH;  Surgeon: [name], MD;  Location: [facility];  Service: Orthopedics;  Laterality: Right;    Prior to Admission medications   Medication Sig Start Date End Date Taking? Authorizing  Provider  aspirin EC 325 MG tablet Take 325 mg by mouth daily.   Yes [provider]  Baclofen 5 MG TABS Take 10 mg by mouth 2 (two) times daily as needed (hiccups). 07/24/21  Yes [provider]  buPROPion (WELLBUTRIN XL) 300 MG 24 hr tablet TAKE 1 TABLET(300 MG) BY MOUTH DAILY Patient taking differently: Take 300 mg by mouth daily. 02/05/21  Yes Isaac Bliss, Rayford Halsted, MD  folic acid (FOLVITE) 1 MG tablet TAKE 1 TABLET(1 MG) BY MOUTH DAILY Patient taking differently: Take 1 mg by mouth daily. 02/05/21  Yes Isaac Bliss, Rayford Halsted, MD  lactulose (CHRONULAC) 10 GM/15ML solution Take 30 mLs (20 g total) by mouth 2 (two) times daily as needed for mild constipation or moderate constipation. 07/13/21  Yes Barb Merino, MD  lisinopril-hydrochlorothiazide (ZESTORETIC) 20-25 MG tablet TAKE 1 TABLET BY MOUTH DAILY 05/06/21  Yes Isaac Bliss, Rayford Halsted, MD  Magnesium 250 MG TABS Take 250 mg by mouth every morning.   Yes [provider]  Multiple Vitamin (MULTIVITAMIN WITH MINERALS) TABS tablet Take 1 tablet by mouth daily. 01/29/20  Yes [name], NP  ondansetron (ZOFRAN) 8 MG tablet Take 8 mg by mouth 3 (three) times daily as needed for nausea/vomiting. 07/16/21  Yes [provider]  pantoprazole (PROTONIX) 40 MG tablet Take 1 tablet (40 mg total) by mouth daily. 06/04/21  Yes Isaac Bliss, Rayford Halsted, MD  prochlorperazine (COMPAZINE) 10 MG tablet Take 10 mg by mouth every 6 (six) hours as needed for nausea/vomiting. 07/16/21  Yes [provider]  traZODone (DESYREL) 100 MG tablet TAKE 1 TABLET(100 MG) BY MOUTH AT BEDTIME Patient taking differently: Take 100 mg by mouth at bedtime. 07/21/21  Yes Isaac Bliss, Rayford Halsted, MD    Current Facility-Administered Medications  Medication Dose Route Frequency Provider Last Rate Last Admin   HYDROmorphone (DILAUDID) injection 1 mg  1 mg Intravenous Q4H PRN Shela Leff, MD   1 mg at 07/28/21 0735    piperacillin-tazobactam (ZOSYN) IVPB 3.375 g  3.375 g Intravenous Q8H Emiliano Dyer, RPH       traZODone (DESYREL) tablet 100 mg  100 mg Oral QHS Shela Leff, MD   100 mg at 07/28/21 0335    Allergies as of 07/27/2021 - Review Complete 07/27/2021  Allergen Reaction Noted   Sulfa antibiotics Swelling and Other (See Comments) 01/18/2018   Advil [ibuprofen] Other (See Comments) 01/24/2020   Aleve [naproxen] Other (See Comments) 01/24/2020   Betadine [povidone iodine] Itching and Rash 01/18/2018   Chloroxylenol (antiseptic) Itching, Rash, and Other (See Comments) 01/03/2018   Poison ivy extract Rash 01/18/2018   Povidone-iodine Itching and Rash 03/09/2010    Family History  Problem Relation Age of Onset   Pancreatic cancer Mother        small bowel cancer   Heart disease Father    Colon cancer Neg Hx    Stomach cancer Neg Hx     Social History   Socioeconomic History   Marital status: Married    Spouse name: [name]   Number of children: Not on file   Years of education: Not on file   Highest education level: Not on file  Occupational History   Not on file  Tobacco Use   Smoking status: Former    Packs/day: 1.00    Years: 32.00    Pack years: 32.00    Types: Cigarettes    Quit date: 06/06/1999    Years since quitting: 22.1   Smokeless tobacco: Never  Vaping Use   Vaping Use: Never used  Substance and Sexual Activity   Alcohol use: Not Currently    Alcohol/week: 14.0 standard drinks    Types: 14 Shots of liquor per week   Drug use: No   Sexual activity: Yes  Other Topics Concern   Not on file  Social History Narrative   Not on file   Social Determinants of Health   Financial Resource Strain: Not on file  Food Insecurity: Not on file  Transportation Needs: Not on file  Physical Activity: Not on file  Stress: Not on file  Social Connections: Not on file  Intimate Partner Violence: Not on file    Review of Systems: Pertinent positive and negative  review of systems were noted in the above HPI section.  All other review of systems was otherwise negative.   Physical Exam: Vital signs in last 24 hours: Temp:  [98 F (36.7 C)-98.7 F (37.1 C)] 98.3 F (36.8 C) (08/23 0830) Pulse Rate:  [80-102] 96 (08/23 0830) Resp:  [8-20] 20 (08/23 0830) BP: (117-189)/(63-96) 168/89 (08/23 0830) SpO2:  [97 %-100 %] 99 % (08/23 0830) Last BM Date: 07/26/21 General:   Alert, jaundiced ill-appearing older white male, pleasant and cooperative in NAD-hollering out  with pain intermittently Head:  Normocephalic and atraumatic. Eyes: Sclera anicteric   conjunctiva pink. Ears:  Normal auditory acuity. Nose:  No deformity, discharge,  or lesions. Mouth:  No deformity or lesions.   Neck:  Supple; no masses or thyromegaly. Lungs:  Clear throughout to auscultation.   No wheezes, crackles, or rhonchi.  Heart:  Regular rate and rhythm; no murmurs, clicks, rubs,  or gallops. Abdomen:  Soft, bowel sounds are present some mild tenderness in the right upper quadrant, no rebound, external Biliary Drain capped Rectal:  Deferred  Msk:  Symmetrical without gross deformities. . Pulses:  Normal pulses noted. Extremities:  Without clubbing or edema. Neurologic:  Alert and  oriented, left hemiparesis Skin:  Intact without significant lesions or rashes.. Psych:  Alert and cooperative. Normal mood and affect.  Intake/Output from previous day: 08/22 0701 - 08/23 0700 In: 1250 [IV Piggyback:1250] Out: -  Intake/Output this shift: No intake/output data recorded.  Lab Results: Recent Labs    07/27/21 2141 07/28/21 0849  WBC 16.3* 26.7*  HGB 11.1* 11.8*  HCT 33.8* 36.7*  PLT 430* 453*   BMET Recent Labs    07/27/21 2141 07/28/21 0849  NA 136 134*  K 4.3 4.1  CL 100 99  CO2 26 26  GLUCOSE 124* 132*  BUN 14 12  CREATININE 0.63 0.66  CALCIUM 9.2 9.2   LFT Recent Labs    07/28/21 0849  PROT 7.0  ALBUMIN 3.0*  AST 30  ALT 53*  ALKPHOS 811*   BILITOT 3.8*   PT/INR No results for input(s): LABPROT, INR in the last 72 hours. Hepatitis Panel No results for input(s): HEPBSAG, HCVAB, HEPAIGM, HEPBIGM in the last 72 hours.    IMPRESSION:  #15 70 year old white male with new diagnosis of cholangiocarcinoma with obstructive jaundice.  Admitted now with acute onset of right upper quadrant and right shoulder pain for the past 24 hours  Patient has developed a progressive leukocytosis, no fever, and LFTs are stable to improved  Picture consistent with developing cholangitis  CT imaging shows improvement in the right-sided intrahepatic ductal dilation after placement of the percutaneous biliary drain at [facility] 07/23/2021  There is some slight increase in the left intrahepatic dilation suggesting possible partial occlusion of the indwelling biliary stent   #2 history of prior CVA with left hemiparesis #3.  Prostate CA #4.  Hypertension  Plan; Start clear liquids IV Zosyn Blood cultures pending Trend LFTs Baclofen 10 mg 3 times daily as needed for hiccups Pain control Patient is on the waiting list for transfer to [facility] though apparently currently no beds.  Hopefully he can get transferred to [facility] within the next 24 hours.  Will discuss further with Dr. Rush Landmark regarding potential need for repeat ERCP with stent exchange     Kysa Calais PA-C 07/28/2021, 10:13 AM

## 2021-07-28 NOTE — ED Notes (Signed)
Patient called out requesting his urinal. When I went into the room patient said he needed his cell phone picked up off the floor and charged. I put his urinal on the bed rail and picked up the phone. I plugged his phone up however it was charged at 100 percent. RN is bed side now.

## 2021-07-28 NOTE — ED Provider Notes (Signed)
1235 Case d/w Dr. Melven Sartorius of oncology at [place], will need IV abx and admission but no beds at this time.  Placed on transfer list.    Randal Buba, Cerina Leary, MD 07/28/21 [id number]

## 2021-07-28 NOTE — Telephone Encounter (Signed)
-----   Message from Irving Copas., MD sent at 07/27/2021  3:12 PM EDT ----- Regarding: Follow-up Denyla Cortese, You can take this patient ERCP recall off our list.  He has had stent exchange and PPDs done at [facility]. He can follow-up with Dr. Fuller Plan in the future if needed. Thanks. GM  FYI MS

## 2021-07-28 NOTE — ED Notes (Signed)
Patient refusing blood draw for second troponin until his pain has been controlled.  MD notified of patient request for additional pain medication.

## 2021-07-28 NOTE — ED Notes (Signed)
Patient continues to call out every 3-5 minutes to see a doctor and to receive pain medication. Hospitalist has been paged and notified. Patient inconsolable. This RN offered reassurance to patient that his situation is being monitored and everyone is working diligently to ensure he is cared for. Patient states "I am dying, I can't believe the doctor is putting me off. I am going to die"

## 2021-07-28 NOTE — ED Notes (Addendum)
Adjusted BP cuff and rechecked blood pressure. Pt's BP 159/79. Per MD, hold hydralazine.

## 2021-07-28 NOTE — ED Notes (Signed)
Pt presents to be very upset and aggressive over pain management. Pt advises he wants "An anesthesiologist to come in and knock me out."

## 2021-07-28 NOTE — ED Notes (Signed)
Patient pressing call bell every 3-5 minutes for pain medication and to see a doctor. All medication ordered has been given.Tried to explain to pt that the hospitalist is very busy and will see him as quickly as possible. Patient states "I am dying, I am being left here to die". Unable to console or redirect.

## 2021-07-28 NOTE — ED Notes (Signed)
Unable to proceed with patient care. Patient states "noone is going to want to be around me until I get some pain medication". Patient unwilling to allow for additional blood draw until pain medication is administered. MD notified, awaiting orders.

## 2021-07-28 NOTE — ED Notes (Signed)
Spoke with wife to notify her that patient will be moving to [id number].

## 2021-07-28 NOTE — Progress Notes (Signed)
Pharmacy Antibiotic Note  Ruben Chavez is a 70 y.o. male admitted on 07/27/2021 with acute cholangitis.  Pharmacy has been consulted for Zosyn dosing.  Plan: Zosyn 3.375g IV q8h (4 hour infusion). No dose adjustments needed, Pharmacy will sign off    Temp (24hrs), Avg:98.3 F (36.8 C), Min:98 F (36.7 C), Max:98.7 F (37.1 C)  Recent Labs  Lab 07/27/21 2141  WBC 16.3*  CREATININE 0.63    CrCl cannot be calculated (Unknown ideal weight.).    Allergies  Allergen Reactions   Sulfa Antibiotics Swelling and Other (See Comments)    Swelling in ankles  Swelling in ankles   Advil [Ibuprofen] Other (See Comments)    "Dots on chest" (Petechiae)   Aleve [Naproxen] Other (See Comments)    "Dots on chest" (Petechiae)   Betadine [Povidone Iodine] Itching and Rash   Chloroxylenol (Antiseptic) Itching, Rash and Other (See Comments)    PCMX surgical sterilizing scrub   Poison Ivy Extract Rash   Povidone-Iodine Itching and Rash    BETADINE    Antimicrobials this admission: 8/23 Vancomycin x 1 8/23 Zosyn >>  Dose adjustments this admission: N/a  Microbiology results: 8/23 BCx:  Thank you for allowing pharmacy to be a part of this patient's care.  Peggyann Juba, PharmD, BCPS Pharmacy: [phone] 07/28/2021 8:47 AM

## 2021-07-28 NOTE — H&P (Signed)
History and Physical    Ruben Chavez [id number] DOB: 1951-01-05 DOA: 07/27/2021  PCP: Isaac Bliss, Rayford Halsted, MD  Patient coming from: Home  I have personally briefly reviewed patient's old medical records in [place]  Chief Complaint: Abdominal pain  HPI: Ruben Chavez is a 70 y.o. male with medical history significant of hypertension, recent diagnosis of cholangiocarcinoma and biliary obstruction status post biliary tube placement at [facility] on 07/23/2021 presented to ED with a complaint of right upper quadrant abdominal pain of sudden duration.  Patient was doing fine up until 4 days since his drain was placed.  He started pain suddenly in the right upper quadrant, which was dull and severe and radiating to the right shoulder.  Per wife, he had subjective fever but temperature was 99, he had diaphoresis as well.  Did not have any other associated symptoms such as nausea, vomiting, constipation, diarrhea.  ED Course: Upon arrival to ED, he was fairly well except some elevated blood pressure.  X-ray right shoulder unremarkable, chest x-ray unremarkable, CT abdomen pelvis with contrast showed stable internal biliary stent extending into segment 2 of the liver, slight interval increase in intrahepatic biliary duct dilatation within segment 2 suggesting possible obstruction of the catheter.  However the right-sided internal and external catheter looked okay.  Had elevated LFTs, alkaline phosphatase and bilirubin however compared to just 2 weeks ago, they were slightly better.  ED physician discussed case with Dr. Melven Sartorius of oncology at [place], and based on his recommendations, will need IV abx and admission but no beds at this time.  Placed on transfer list.  Hospital service were consulted to admit the patient here.  Review of Systems: As per HPI otherwise negative.    Past Medical History:  Diagnosis Date   Elevated PSA    Hyperlipidemia    pt says not anymore    Hypertension    Prostate cancer ([place]) 2013    Past Surgical History:  Procedure Laterality Date   bilateral inguinal hernia  2008   BILIARY BRUSHING  06/23/2021   Procedure: BILIARY BRUSHING;  Surgeon: Irving Copas., MD;  Location: [facility];  Service: Gastroenterology;;   BILIARY DILATION  06/23/2021   Procedure: BILIARY DILATION;  Surgeon: Irving Copas., MD;  Location: [facility];  Service: Gastroenterology;;   BILIARY STENT PLACEMENT N/A 06/23/2021   Procedure: BILIARY STENT PLACEMENT;  Surgeon: Irving Copas., MD;  Location: [facility];  Service: Gastroenterology;  Laterality: N/A;   BIOPSY  06/23/2021   Procedure: BIOPSY;  Surgeon: Rush Landmark Telford Nab., MD;  Location: [facility];  Service: Gastroenterology;;   Kathleen Argue STUDY  01/28/2020   Procedure: BUBBLE STUDY;  Surgeon: [name], MD;  Location: [facility];  Service: Cardiovascular;;   CYST REMOVAL TRUNK  2016   sebaceous cyst   CYSTOSCOPY  04/22/2016   Procedure: CYSTOSCOPY;  Surgeon: [name], MD;  Location: [facility];  Service: Urology;;   ERCP N/A 06/23/2021   Procedure: ENDOSCOPIC RETROGRADE CHOLANGIOPANCREATOGRAPHY (ERCP);  Surgeon: Irving Copas., MD;  Location: [facility];  Service: Gastroenterology;  Laterality: N/A;   IR ANGIO INTRA EXTRACRAN SEL COM CAROTID INNOMINATE BILAT MOD SED  05/30/2020   IR ANGIO VERTEBRAL SEL SUBCLAVIAN INNOMINATE UNI R MOD SED  01/25/2020   IR ANGIO VERTEBRAL SEL VERTEBRAL BILAT MOD SED  05/30/2020   IR CT HEAD LTD  01/25/2020   IR PERCUTANEOUS ART THROMBECTOMY/INFUSION INTRACRANIAL INC DIAG ANGIO  01/25/2020   IR US  GUIDE VASC ACCESS RIGHT  05/30/2020   KNEE ARTHROSCOPY  2007   right   LOOP RECORDER INSERTION N/A 01/28/2020   Procedure: LOOP RECORDER INSERTION;  Surgeon: [name], MD;  Location: [place] [facility];  Service: Cardiovascular;  Laterality: N/A;   PANCREATIC STENT PLACEMENT  06/23/2021   Procedure:  PANCREATIC STENT PLACEMENT;  Surgeon: Irving Copas., MD;  Location: [facility];  Service: Gastroenterology;;   PROSTATE BIOPSY  2008   PROSTATE BIOPSY  2013   PROSTATE BIOPSY  01/28/2016   RADIOACTIVE SEED IMPLANT N/A 04/22/2016   Procedure: RADIOACTIVE SEED IMPLANT/BRACHYTHERAPY IMPLANT;  Surgeon: [name], MD;  Location: [facility];  Service: Urology;  Laterality: N/A;   RADIOLOGY WITH ANESTHESIA N/A 01/24/2020   Procedure: IR WITH ANESTHESIA;  Surgeon: [name], MD;  Location: [place];  Service: Radiology;  Laterality: N/A;   REMOVAL OF STONES  06/23/2021   Procedure: REMOVAL OF STONES;  Surgeon: Rush Landmark Telford Nab., MD;  Location: [facility];  Service: Gastroenterology;;   Joan Mayans  06/23/2021   Procedure: Joan Mayans;  Surgeon: Rush Landmark Telford Nab., MD;  Location: [facility];  Service: Gastroenterology;;   TEE WITHOUT CARDIOVERSION N/A 01/28/2020   Procedure: TRANSESOPHAGEAL ECHOCARDIOGRAM (TEE);  Surgeon: [name], MD;  Location: [facility];  Service: Cardiovascular;  Laterality: N/A;   TONSILLECTOMY     TOTAL HIP ARTHROPLASTY  2001   left   TOTAL HIP ARTHROPLASTY Right 01/18/2018   Procedure: RIGHT TOTAL HIP ARTHROPLASTY ANTERIOR APPROACH;  Surgeon: [name], MD;  Location: [facility];  Service: Orthopedics;  Laterality: Right;     reports that he quit smoking about 22 years ago. His smoking use included cigarettes. He has a 32.00 pack-year smoking history. He has never used smokeless tobacco. He reports that he does not currently use alcohol after a past usage of about 14.0 standard drinks per week. He reports that he does not use drugs.  Allergies  Allergen Reactions   Sulfa Antibiotics Swelling and Other (See Comments)    Swelling in ankles  Swelling in ankles   Advil [Ibuprofen] Other (See Comments)    "Dots on chest" (Petechiae)   Aleve [Naproxen] Other (See Comments)    "Dots on chest" (Petechiae)    Betadine [Povidone Iodine] Itching and Rash   Chloroxylenol (Antiseptic) Itching, Rash and Other (See Comments)    PCMX surgical sterilizing scrub   Poison Ivy Extract Rash   Povidone-Iodine Itching and Rash    BETADINE    Family History  Problem Relation Age of Onset   Pancreatic cancer Mother        small bowel cancer   Heart disease Father    Colon cancer Neg Hx    Stomach cancer Neg Hx     Prior to Admission medications   Medication Sig Start Date End Date Taking? Authorizing Provider  aspirin EC 325 MG tablet Take 325 mg by mouth daily.   Yes [provider]  Baclofen 5 MG TABS Take 10 mg by mouth 2 (two) times daily as needed (hiccups). 07/24/21  Yes [provider]  buPROPion (WELLBUTRIN XL) 300 MG 24 hr tablet TAKE 1 TABLET(300 MG) BY MOUTH DAILY Patient taking differently: Take 300 mg by mouth daily. 02/05/21  Yes Isaac Bliss, Rayford Halsted, MD  folic acid (FOLVITE) 1 MG tablet TAKE 1 TABLET(1 MG) BY MOUTH DAILY Patient taking differently: Take 1 mg by mouth daily. 02/05/21  Yes Isaac Bliss, Rayford Halsted, MD  lactulose ([place]) 10 GM/15ML  solution Take 30 mLs (20 g total) by mouth 2 (two) times daily as needed for mild constipation or moderate constipation. 07/13/21  Yes Barb Merino, MD  lisinopril-hydrochlorothiazide (ZESTORETIC) 20-25 MG tablet TAKE 1 TABLET BY MOUTH DAILY 05/06/21  Yes Isaac Bliss, Rayford Halsted, MD  Magnesium 250 MG TABS Take 250 mg by mouth every morning.   Yes [provider]  Multiple Vitamin (MULTIVITAMIN WITH MINERALS) TABS tablet Take 1 tablet by mouth daily. 01/29/20  Yes [name], NP  ondansetron (ZOFRAN) 8 MG tablet Take 8 mg by mouth 3 (three) times daily as needed for nausea/vomiting. 07/16/21  Yes [provider]  pantoprazole (PROTONIX) 40 MG tablet Take 1 tablet (40 mg total) by mouth daily. 06/04/21  Yes Isaac Bliss, Rayford Halsted, MD  prochlorperazine (COMPAZINE) 10 MG tablet Take 10 mg by mouth every 6  (six) hours as needed for nausea/vomiting. 07/16/21  Yes [provider]  traZODone (DESYREL) 100 MG tablet TAKE 1 TABLET(100 MG) BY MOUTH AT BEDTIME Patient taking differently: Take 100 mg by mouth at bedtime. 07/21/21  Yes Erline Hau, MD    Physical Exam: Vitals:   07/28/21 0615 07/28/21 0630 07/28/21 0730 07/28/21 0830  BP:  (!) 145/71 117/74 (!) 168/89  Pulse: (!) 102 96 95 96  Resp:  '18 18 20  '$ Temp:   98 F (36.7 C) 98.3 F (36.8 C)  TempSrc:    Oral  SpO2: 100% 99% 100% 99%    Constitutional: NAD, calm, comfortable Vitals:   07/28/21 0615 07/28/21 0630 07/28/21 0730 07/28/21 0830  BP:  (!) 145/71 117/74 (!) 168/89  Pulse: (!) 102 96 95 96  Resp:  '18 18 20  '$ Temp:   98 F (36.7 C) 98.3 F (36.8 C)  TempSrc:    Oral  SpO2: 100% 99% 100% 99%   Eyes: PERRL, lids and conjunctivae normal ENMT: Mucous membranes are moist. Posterior pharynx clear of any exudate or lesions.Normal dentition.  Neck: normal, supple, no masses, no thyromegaly Respiratory: clear to auscultation bilaterally, no wheezing, no crackles. Normal respiratory effort. No accessory muscle use.  Cardiovascular: Regular rate and rhythm, no murmurs / rubs / gallops. No extremity edema. 2+ pedal pulses. No carotid bruits.  Abdomen: Epigastric, right upper quadrant and periumbilical tenderness.  Bowel sounds positive.  Musculoskeletal: no clubbing / cyanosis. No joint deformity upper and lower extremities. Good ROM, no contractures. Normal muscle tone.  Skin: no rashes, lesions, ulcers. No induration Neurologic: CN 2-12 grossly intact. Sensation intact, DTR normal. Strength 5/5 in all 4.  Psychiatric: Normal judgment and insight. Alert and oriented x 3. Normal mood.    Labs on Admission: I have personally reviewed following labs and imaging studies  CBC: Recent Labs  Lab 07/27/21 2141 07/28/21 0849  WBC 16.3* 26.7*  NEUTROABS 14.7* 24.8*  HGB 11.1* 11.8*  HCT 33.8* 36.7*  MCV 93.6  92.9  PLT 430* 0000000*   Basic Metabolic Panel: Recent Labs  Lab 07/27/21 2141 07/28/21 0849  NA 136 134*  K 4.3 4.1  CL 100 99  CO2 26 26  GLUCOSE 124* 132*  BUN 14 12  CREATININE 0.63 0.66  CALCIUM 9.2 9.2  MG  --  1.9   GFR: CrCl cannot be calculated (Unknown ideal weight.). Liver Function Tests: Recent Labs  Lab 07/27/21 2141 07/28/21 0849  AST 49* 30  ALT 64* 53*  ALKPHOS 937* 811*  BILITOT 3.9* 3.8*  PROT 7.5 7.0  ALBUMIN 3.2* 3.0*   Recent  Labs  Lab 07/27/21 2141  LIPASE 30   No results for input(s): AMMONIA in the last 168 hours. Coagulation Profile: No results for input(s): INR, PROTIME in the last 168 hours. Cardiac Enzymes: No results for input(s): CKTOTAL, CKMB, CKMBINDEX, TROPONINI in the last 168 hours. BNP (last 3 results) No results for input(s): PROBNP in the last 8760 hours. HbA1C: No results for input(s): HGBA1C in the last 72 hours. CBG: No results for input(s): GLUCAP in the last 168 hours. Lipid Profile: No results for input(s): CHOL, HDL, LDLCALC, TRIG, CHOLHDL, LDLDIRECT in the last 72 hours. Thyroid Function Tests: No results for input(s): TSH, T4TOTAL, FREET4, T3FREE, THYROIDAB in the last 72 hours. Anemia Panel: No results for input(s): VITAMINB12, FOLATE, FERRITIN, TIBC, IRON, RETICCTPCT in the last 72 hours. Urine analysis:    Component Value Date/Time   COLORURINE AMBER (A) 07/27/2021 2141   APPEARANCEUR CLEAR 07/27/2021 2141   LABSPEC 1.028 07/27/2021 2141   PHURINE 5.0 07/27/2021 2141   GLUCOSEU NEGATIVE 07/27/2021 2141   HGBUR NEGATIVE 07/27/2021 2141   HGBUR negative 03/13/2010 1145   BILIRUBINUR NEGATIVE 07/27/2021 2141   BILIRUBINUR 1+ 11/13/2019 1341   KETONESUR NEGATIVE 07/27/2021 2141   PROTEINUR 30 (A) 07/27/2021 2141   UROBILINOGEN 0.2 11/13/2019 1341   UROBILINOGEN 0.2 03/13/2010 1145   NITRITE NEGATIVE 07/27/2021 2141   LEUKOCYTESUR NEGATIVE 07/27/2021 2141    Radiological Exams on Admission: DG  Shoulder Right  Result Date: 07/27/2021 CLINICAL DATA:  Shoulder pain EXAM: RIGHT SHOULDER - 2+ VIEW COMPARISON:  None. FINDINGS: No acute bony abnormality. Specifically, no fracture, subluxation, or dislocation. Joint spaces maintained. Soft tissues are intact. IMPRESSION: No acute bony abnormality. Electronically Signed   By: Rolm Baptise M.D.   On: 07/27/2021 23:51   CT Abdomen Pelvis W Contrast  Result Date: 07/27/2021 CLINICAL DATA:  Abdominal pain, acute, nonlocalized Pain at cholecystostomy tube site EXAM: CT ABDOMEN AND PELVIS WITH CONTRAST TECHNIQUE: Multidetector CT imaging of the abdomen and pelvis was performed using the standard protocol following bolus administration of intravenous contrast. CONTRAST:  45m OMNIPAQUE IOHEXOL 350 MG/ML SOLN COMPARISON:  07/10/2021, MRI 06/23/2021 FINDINGS: Lower chest: Bibasilar atelectasis is present. Central venous catheter tip noted within the a right atrium. Global cardiac size within normal limits. Hepatobiliary: Internal biliary stent is again seen extending from the common duct within the pancreatic head into the a segment 2 biliary tree. There has been interval placement of a right internal/external biliary drainage catheter. There is mild infiltration of the perihepatic tissue adjacent to the a catheter exit site, however, there is no discrete drainable fluid collection identified. Right-sided intrahepatic biliary ductal dilation has improved. Mild left-sided biliary ductal dilation has developed within segment 2. Cyst within segment 4 of the liver is unchanged. The gallbladder is mildly distended, but is otherwise unremarkable. Pancreas: Unremarkable Spleen: Unremarkable Adrenals/Urinary Tract: The adrenal glands are unremarkable. The kidneys are normal in size and position. Simple cortical cyst noted within the lower pole the right kidney. 3 mm nonobstructing calculus noted within the lower pole of the left kidney. The bladder is largely obscured by  streak artifact from bilateral total hip arthroplasty. Stomach/Bowel: Stomach and visualized small bowel are unremarkable. Appendix normal. Moderate stool noted within the ascending and transverse colon without evidence of obstruction. The large bowel is otherwise unremarkable. No free intraperitoneal gas or fluid. Vascular/Lymphatic: Extensive aortoiliac atherosclerotic calcification. No aortic aneurysm. No pathologic adenopathy within the abdomen and pelvis. Reproductive: Brachytherapy seeds noted within the prostate gland. Other: Probable  bilateral inguinal hernia repair with mesh. Rectum unremarkable. Musculoskeletal: Bilateral total hip arthroplasty has been performed. Degenerative changes are seen within the lumbar spine. No acute bone abnormality. IMPRESSION: Interval right-sided internal/external biliary drainage catheter placement. Catheter appears propria Lea position with interval decrease in right intrahepatic biliary ductal dilation. Mild perihepatic soft tissue infiltration adjacent to the catheter exit site is noted, possibly postprocedural in nature, however, no discrete drainable fluid collection is identified. Stable internal biliary stent extending into segment 2 of the liver. Slight interval increase in intrahepatic biliary ductal dilation within segment 2 suggesting possible obstruction of the catheter. Moderate stool throughout the colon without evidence of obstruction. Minimal left nonobstructing nephrolithiasis Aortic Atherosclerosis (ICD10-I70.0). Electronically Signed   By: Fidela Salisbury M.D.   On: 07/27/2021 23:54   DG Chest Port 1 View  Result Date: 07/27/2021 CLINICAL DATA:  Leukocytosis EXAM: PORTABLE CHEST 1 VIEW COMPARISON:  07/10/2021 FINDINGS: Right Port-A-Cath is in place with the tip in the SVC. Heart is normal size. No confluent airspace opacities or effusions. No acute bony abnormality. IMPRESSION: No active disease. Electronically Signed   By: Rolm Baptise M.D.   On:  07/27/2021 23:51    EKG: Independently reviewed.  Sinus tachycardia, nonspecific T wave abdominal disease,  Assessment/Plan Active Problems:   Cholangitis   Intrahepatic biliary duct dilatation, likely stent obstruction in a patient with cholangiocarcinoma and recent biliary drain placement/possible acute cholangitis: LFTs elevated but better than before.  Patient with pain.  Continue as needed IV Dilaudid.  Has significant leukocytosis which actually worsened significantly over the course of last few hours.  He is afebrile here.  Per his oncologist recommendations, continue antibiotics and no need for vancomycin so we will only place him on Zosyn.  I have consulted [facility] here, he already had established relationship with them.  He is on the list for [facility] to [facility].  Essential hypertension: Slightly elevated.  Resume home dose of lisinopril and hydrochlorothiazide.  As needed hydralazine.  DVT prophylaxis:  Code Status: Full code Family Communication: None present at bedside.  Plan of care discussed with patient and then discussed with his wife over the phone. Disposition Plan: Potential transfer to [facility] if bed available otherwise Home when medically stable. Consults called: GI   Status is: Inpatient  Remains inpatient appropriate because:Inpatient level of care appropriate due to severity of illness  Dispo: The patient is from: Home              Anticipated d/c is to: Home              Patient currently is not medically stable to d/c.   Difficult to place patient No       Darliss Cheney MD [facility]  07/28/2021, 9:41 AM  To contact the attending provider between 7A-7P or the covering provider during after hours 7P-7A, please log into the web site [email or web address]

## 2021-07-29 ENCOUNTER — Other Ambulatory Visit: Payer: Self-pay

## 2021-07-29 DIAGNOSIS — D72829 Elevated white blood cell count, unspecified: Secondary | ICD-10-CM

## 2021-07-29 DIAGNOSIS — R932 Abnormal findings on diagnostic imaging of liver and biliary tract: Secondary | ICD-10-CM

## 2021-07-29 LAB — COMPREHENSIVE METABOLIC PANEL
ALT: 35 U/L (ref 0–44)
AST: 25 U/L (ref 15–41)
Albumin: 2.4 g/dL — ABNORMAL LOW (ref 3.5–5.0)
Alkaline Phosphatase: 606 U/L — ABNORMAL HIGH (ref 38–126)
Anion gap: 10 (ref 5–15)
BUN: 21 mg/dL (ref 8–23)
CO2: 23 mmol/L (ref 22–32)
Calcium: 8.7 mg/dL — ABNORMAL LOW (ref 8.9–10.3)
Chloride: 102 mmol/L (ref 98–111)
Creatinine, Ser: 1.03 mg/dL (ref 0.61–1.24)
GFR, Estimated: >60 mL/min (ref >60)
Glucose, Bld: 100 mg/dL — ABNORMAL HIGH (ref 70–99)
Potassium: 4.1 mmol/L (ref 3.5–5.1)
Sodium: 135 mmol/L (ref 135–145)
Total Bilirubin: 3.3 mg/dL — ABNORMAL HIGH (ref 0.3–1.2)
Total Protein: 6.2 g/dL — ABNORMAL LOW (ref 6.5–8.1)

## 2021-07-29 LAB — CBC WITH DIFFERENTIAL/PLATELET
Basophils Absolute: 0 10*3/uL (ref 0.0–0.1)
Basophils Relative: 0 %
Eosinophils Absolute: 0.3 10*3/uL (ref 0.0–0.5)
Eosinophils Relative: 1 %
HCT: 36.5 % — ABNORMAL LOW (ref 39.0–52.0)
Hemoglobin: 12 g/dL — ABNORMAL LOW (ref 13.0–17.0)
Immature Granulocytes: 3 %
Lymphocytes Relative: 5 %
Lymphs Abs: 1.4 10*3/uL (ref 0.7–4.0)
MCH: 30.2 pg (ref 26.0–34.0)
MCHC: 32.9 g/dL (ref 30.0–36.0)
MCV: 91.9 fL (ref 80.0–100.0)
Monocytes Absolute: 1.9 10*3/uL — ABNORMAL HIGH (ref 0.1–1.0)
Monocytes Relative: 7 %
Neutro Abs: 23.3 10*3/uL — ABNORMAL HIGH (ref 1.7–7.7)
Neutrophils Relative %: 84 %
Other: 3 %
Platelets: 450 10*3/uL — ABNORMAL HIGH (ref 150–400)
RBC: 3.97 MIL/uL — ABNORMAL LOW (ref 4.22–5.81)
RDW: 15.4 % (ref 11.5–15.5)
WBC: 27.7 10*3/uL — ABNORMAL HIGH (ref 4.0–10.5)
nRBC: 0 % (ref 0.0–0.2)

## 2021-07-29 LAB — LACTIC ACID, PLASMA
Lactic Acid, Venous: 1 mmol/L (ref 0.5–1.9)
Lactic Acid, Venous: 1.5 mmol/L (ref 0.5–1.9)

## 2021-07-29 NOTE — Progress Notes (Signed)
PROGRESS NOTE    Ruben Chavez  [id number] DOB: [birth year] DOA: 07/27/2021 PCP: Isaac Bliss, Rayford Halsted, MD     Brief Narrative:  Ruben Chavez is a 70 y.o. male with medical history significant of hypertension, recent diagnosis of cholangiocarcinoma and biliary obstruction status post biliary tube placement at [facility] on 07/23/2021 presented to ED with a complaint of right upper quadrant abdominal pain.  Patient was doing fine up until 4 days since his drain was placed.  He started having pain suddenly in the right upper quadrant, which was dull and severe and radiating to the right shoulder.  Per wife, he had subjective fever but temperature was 99, he had diaphoresis as well.  Did not have any other associated symptoms such as nausea, vomiting, constipation, diarrhea. CT abdomen pelvis with contrast showed stable internal biliary stent extending into segment 2 of the liver, slight interval increase in intrahepatic biliary duct dilatation within segment 2 suggesting possible obstruction of the catheter.  However the right-sided internal and external catheter looked okay.  Had elevated LFTs, alkaline phosphatase and bilirubin however compared to just 2 weeks ago, they were slightly better. ED physician discussed case with Dr. Melven Sartorius of oncology at [place], and based on his recommendations, will need IV abx and admission but no beds at this time.  Placed on transfer list.   New events last 24 hours / Subjective: Patient states that he is feeling better, no nausea or vomiting, denies any abdominal pain.  Assessment & Plan:   Active Problems:   Essential hypertension   Bile duct obstruction   Cholangitis   Concern for cholangitis, either stent obstruction with intrahepatic biliary duct dilatation versus recent percutaneous bile duct manipulation History of cholangiocarcinoma with recent biliary drain placement -Continue IV Zosyn -Blood cultures pending -GI following -LFT  improved, GI does not feel the stent in the left system is obstructed, possibly seeded from recent percutaneous bile duct manipulation -Currently on waitlist to transfer to [facility] -Diet advanced today  Essential hypertension -Continue lisinopril  Mood disorder -Continue Wellbutrin, trazodone     DVT prophylaxis:  enoxaparin (LOVENOX) injection 40 mg Start: 07/28/21 2200  Code Status:     Code Status Orders  (From admission, onward)           Start     Ordered   07/28/21 1022  Full code  Continuous        07/28/21 1023           Code Status History     Date Active Date Inactive Code Status Order ID Comments User Context   07/10/2021 1730 07/13/2021 2121 Full Code [id number]  Jonnie Finner, DO ED   06/22/2021 2146 06/26/2021 2125 Full Code [id number]  Gwynne Edinger, MD ED   01/28/2020 1831 02/13/2020 1720 Full Code [id number]  Cathlyn Parsons, PA-C Inpatient   01/28/2020 1831 01/28/2020 1831 Full Code [id number]  Cathlyn Parsons, PA-C Inpatient   01/25/2020 0005 01/28/2020 1743 Full Code [id number]  Kerney Elbe, MD ED   01/18/2018 1921 01/19/2018 1937 Full Code [id number]  [name], MD Inpatient      Advance Directive Documentation    Flowsheet Row Most Recent Value  Type of Advance Directive Living will  Pre-existing out of facility DNR order (yellow form or pink MOST form) --  "MOST" Form in Place? --      Family Communication: No family at bedside Disposition Plan:  Status is: Inpatient  Remains inpatient appropriate because:IV  treatments appropriate due to intensity of illness or inability to take PO  Dispo: The patient is from: Home              Anticipated d/c is to: Home              Patient currently is not medically stable to d/c.   Difficult to place patient No      Consultants:  GI   Antimicrobials:  Anti-infectives (From admission, onward)    Start     Dose/Rate Route Frequency Ordered Stop   07/28/21 1000   piperacillin-tazobactam (ZOSYN) IVPB 3.375 g        3.375 g 12.5 mL/hr over 240 Minutes Intravenous Every 8 hours 07/28/21 0846     07/28/21 0045  vancomycin (VANCOCIN) IVPB 1000 mg/200 mL premix        1,000 mg 200 mL/hr over 60 Minutes Intravenous  Once 07/28/21 0038 07/28/21 0309   07/28/21 0045  piperacillin-tazobactam (ZOSYN) IVPB 3.375 g        3.375 g 100 mL/hr over 30 Minutes Intravenous  Once 07/28/21 0038 07/28/21 0238        Objective: Vitals:   07/28/21 2206 07/29/21 0210 07/29/21 0614 07/29/21 1447  BP: (!) 95/53 (!) '87/50 95/63 96/63 '$  Pulse: 89 83 87 92  Resp: '17 17 17 20  '$ Temp: 98.1 F (36.7 C) 98 F (36.7 C) 98 F (36.7 C) 98.1 F (36.7 C)  TempSrc: Oral   Oral  SpO2: 95% 98% 98% 100%    Intake/Output Summary (Last 24 hours) at 07/29/2021 1507 Last data filed at 07/29/2021 0900 Gross per 24 hour  Intake 849.99 ml  Output --  Net 849.99 ml   There were no vitals filed for this visit.  Examination:  General exam: Appears calm and comfortable  Respiratory system: Clear to auscultation. Respiratory effort normal. No respiratory distress. No conversational dyspnea.  Cardiovascular system: S1 & S2 heard, RRR. No murmurs. No pedal edema. Gastrointestinal system: Abdomen is nondistended, soft and nontender. Normal bowel sounds heard. Central nervous system: Alert and oriented. No focal neurological deficits. Speech clear.  Extremities: Symmetric in appearance  Skin: No rashes, lesions or ulcers on exposed skin  Psychiatry: Judgement and insight appear normal. Mood & affect appropriate.   Data Reviewed: I have personally reviewed following labs and imaging studies  CBC: Recent Labs  Lab 07/27/21 2141 07/28/21 0849 07/29/21 0500  WBC 16.3* 26.7* 27.7*  NEUTROABS 14.7* 24.8* 23.3*  HGB 11.1* 11.8* 12.0*  HCT 33.8* 36.7* 36.5*  MCV 93.6 92.9 91.9  PLT 430* 453* [id number]*   Basic Metabolic Panel: Recent Labs  Lab 07/27/21 2141 07/28/21 0849  07/29/21 0500  NA 136 134* 135  K 4.3 4.1 4.1  CL 100 99 102  CO2 '26 26 23  '$ GLUCOSE 124* 132* 100*  BUN '14 12 21  '$ CREATININE 0.63 0.66 1.03  CALCIUM 9.2 9.2 8.7*  MG  --  1.9  --    GFR: CrCl cannot be calculated (Unknown ideal weight.). Liver Function Tests: Recent Labs  Lab 07/27/21 2141 07/28/21 0849 07/29/21 0500  AST 49* 30 25  ALT 64* 53* 35  ALKPHOS 937* 811* 606*  BILITOT 3.9* 3.8* 3.3*  PROT 7.5 7.0 6.2*  ALBUMIN 3.2* 3.0* 2.4*   Recent Labs  Lab 07/27/21 2141  LIPASE 30   No results for input(s): AMMONIA in the last 168 hours. Coagulation Profile: No results for input(s): INR, PROTIME in the last 168 hours. Cardiac Enzymes:  No results for input(s): CKTOTAL, CKMB, CKMBINDEX, TROPONINI in the last 168 hours. BNP (last 3 results) No results for input(s): PROBNP in the last 8760 hours. HbA1C: No results for input(s): HGBA1C in the last 72 hours. CBG: No results for input(s): GLUCAP in the last 168 hours. Lipid Profile: No results for input(s): CHOL, HDL, LDLCALC, TRIG, CHOLHDL, LDLDIRECT in the last 72 hours. Thyroid Function Tests: No results for input(s): TSH, T4TOTAL, FREET4, T3FREE, THYROIDAB in the last 72 hours. Anemia Panel: No results for input(s): VITAMINB12, FOLATE, FERRITIN, TIBC, IRON, RETICCTPCT in the last 72 hours. Sepsis Labs: No results for input(s): PROCALCITON, LATICACIDVEN in the last 168 hours.  Recent Results (from the past 240 hour(s))  Blood culture (routine x 2)     Status: None (Preliminary result)   Collection Time: 07/28/21  1:49 AM   Specimen: BLOOD RIGHT HAND  Result Value Ref Range Status   Specimen Description   Final    BLOOD RIGHT HAND Performed at [facility], [place] [address], [place], [place] [address]    Special Requests   Final    BOTTLES DRAWN AEROBIC AND ANAEROBIC Blood Culture adequate volume Performed at [facility] [address] [place], [place] [address]    Culture   Final     NO GROWTH 1 DAY Performed at [facility], [place] [address] [place], [place] [address]    Report Status PENDING  Incomplete  Resp Panel by RT-PCR (Flu A&B, Covid) Nasopharyngeal Swab     Status: None   Collection Time: 07/28/21  1:51 AM   Specimen: Nasopharyngeal Swab; Nasopharyngeal(NP) swabs in vial transport medium  Result Value Ref Range Status   SARS Coronavirus 2 by RT PCR NEGATIVE NEGATIVE Final    Comment: (NOTE) SARS-CoV-2 target nucleic acids are NOT DETECTED.  The SARS-CoV-2 RNA is generally detectable in upper respiratory specimens during the acute phase of infection. The lowest concentration of SARS-CoV-2 viral copies this assay can detect is 138 copies/mL. A negative result does not preclude SARS-Cov-2 infection and should not be used as the sole basis for treatment or other patient management decisions. A negative result may occur with  improper specimen collection/handling, submission of specimen other than nasopharyngeal swab, presence of viral mutation(s) within the areas targeted by this assay, and inadequate number of viral copies(<138 copies/mL). A negative result must be combined with clinical observations, patient history, and epidemiological information. The expected result is Negative.  Fact Sheet for Patients:  EntrepreneurPulse.com.au  Fact Sheet for Healthcare Providers:  IncredibleEmployment.be  This test is no t yet approved or cleared by the [place] FDA and  has been authorized for detection and/or diagnosis of SARS-CoV-2 by FDA under an Emergency Use Authorization (EUA). This EUA will remain  in effect (meaning this test can be used) for the duration of the COVID-19 declaration under Section 564(b)(1) of the Act, 21 U.S.C.section 360bbb-3(b)(1), unless the authorization is terminated  or revoked sooner.       Influenza A by PCR NEGATIVE NEGATIVE Final   Influenza B by PCR NEGATIVE NEGATIVE  Final    Comment: (NOTE) The Xpert Xpress SARS-CoV-2/FLU/RSV plus assay is intended as an aid in the diagnosis of influenza from Nasopharyngeal swab specimens and should not be used as a sole basis for treatment. Nasal washings and aspirates are unacceptable for Xpert Xpress SARS-CoV-2/FLU/RSV testing.  Fact Sheet for Patients: EntrepreneurPulse.com.au  Fact Sheet for Healthcare Providers: IncredibleEmployment.be  This test is not yet approved  or cleared by the [place] and has been authorized for detection and/or diagnosis of SARS-CoV-2 by FDA under an Emergency Use Authorization (EUA). This EUA will remain in effect (meaning this test can be used) for the duration of the COVID-19 declaration under Section 564(b)(1) of the Act, 21 U.S.C. section 360bbb-3(b)(1), unless the authorization is terminated or revoked.  Performed at [facility], [place] [address], [place], [place] [address]   Blood culture (routine x 2)     Status: None (Preliminary result)   Collection Time: 07/28/21  2:05 AM   Specimen: BLOOD  Result Value Ref Range Status   Specimen Description   Final    BLOOD BLOOD RIGHT FOREARM Performed at [facility] [address], [place], [place] [address]    Special Requests   Final    BOTTLES DRAWN AEROBIC AND ANAEROBIC Blood Culture adequate volume Performed at [facility] [address] [place], [place] [address]    Culture   Final    NO GROWTH 1 DAY Performed at [facility], [place] [address], [place], [place] [address]    Report Status PENDING  Incomplete      Radiology Studies: DG Shoulder Right  Result Date: 07/27/2021 CLINICAL DATA:  Shoulder pain EXAM: RIGHT SHOULDER - 2+ VIEW COMPARISON:  None. FINDINGS: No acute bony abnormality. Specifically, no fracture, subluxation, or dislocation. Joint spaces maintained. Soft tissues are intact. IMPRESSION:  No acute bony abnormality. Electronically Signed   By: Rolm Baptise M.D.   On: 07/27/2021 23:51   CT Abdomen Pelvis W Contrast  Result Date: 07/27/2021 CLINICAL DATA:  Abdominal pain, acute, nonlocalized Pain at cholecystostomy tube site EXAM: CT ABDOMEN AND PELVIS WITH CONTRAST TECHNIQUE: Multidetector CT imaging of the abdomen and pelvis was performed using the standard protocol following bolus administration of intravenous contrast. CONTRAST:  6m OMNIPAQUE IOHEXOL 350 MG/ML SOLN COMPARISON:  07/10/2021, MRI 06/23/2021 FINDINGS: Lower chest: Bibasilar atelectasis is present. Central venous catheter tip noted within the a right atrium. Global cardiac size within normal limits. Hepatobiliary: Internal biliary stent is again seen extending from the common duct within the pancreatic head into the a segment 2 biliary tree. There has been interval placement of a right internal/external biliary drainage catheter. There is mild infiltration of the perihepatic tissue adjacent to the a catheter exit site, however, there is no discrete drainable fluid collection identified. Right-sided intrahepatic biliary ductal dilation has improved. Mild left-sided biliary ductal dilation has developed within segment 2. Cyst within segment 4 of the liver is unchanged. The gallbladder is mildly distended, but is otherwise unremarkable. Pancreas: Unremarkable Spleen: Unremarkable Adrenals/Urinary Tract: The adrenal glands are unremarkable. The kidneys are normal in size and position. Simple cortical cyst noted within the lower pole the right kidney. 3 mm nonobstructing calculus noted within the lower pole of the left kidney. The bladder is largely obscured by streak artifact from bilateral total hip arthroplasty. Stomach/Bowel: Stomach and visualized small bowel are unremarkable. Appendix normal. Moderate stool noted within the ascending and transverse colon without evidence of obstruction. The large bowel is otherwise unremarkable.  No free intraperitoneal gas or fluid. Vascular/Lymphatic: Extensive aortoiliac atherosclerotic calcification. No aortic aneurysm. No pathologic adenopathy within the abdomen and pelvis. Reproductive: Brachytherapy seeds noted within the prostate gland. Other: Probable bilateral inguinal hernia repair with mesh. Rectum unremarkable. Musculoskeletal: Bilateral total hip arthroplasty has been performed. Degenerative changes are seen within the lumbar spine. No acute bone abnormality. IMPRESSION: Interval right-sided internal/external  biliary drainage catheter placement. Catheter appears propria Lea position with interval decrease in right intrahepatic biliary ductal dilation. Mild perihepatic soft tissue infiltration adjacent to the catheter exit site is noted, possibly postprocedural in nature, however, no discrete drainable fluid collection is identified. Stable internal biliary stent extending into segment 2 of the liver. Slight interval increase in intrahepatic biliary ductal dilation within segment 2 suggesting possible obstruction of the catheter. Moderate stool throughout the colon without evidence of obstruction. Minimal left nonobstructing nephrolithiasis Aortic Atherosclerosis (ICD10-I70.0). Electronically Signed   By: Fidela Salisbury M.D.   On: 07/27/2021 23:54   DG Chest Port 1 View  Result Date: 07/27/2021 CLINICAL DATA:  Leukocytosis EXAM: PORTABLE CHEST 1 VIEW COMPARISON:  07/10/2021 FINDINGS: Right Port-A-Cath is in place with the tip in the SVC. Heart is normal size. No confluent airspace opacities or effusions. No acute bony abnormality. IMPRESSION: No active disease. Electronically Signed   By: Rolm Baptise M.D.   On: 07/27/2021 23:51      Scheduled Meds:  aspirin EC  325 mg Oral Daily   buPROPion  300 mg Oral Daily   Chlorhexidine Gluconate Cloth  6 each Topical Daily   enoxaparin (LOVENOX) injection  40 mg Subcutaneous [id number]   folic acid  1 mg Oral Daily   lisinopril  20 mg Oral Daily    And   hydrochlorothiazide  25 mg Oral Daily   pantoprazole  40 mg Oral Daily   sodium chloride flush  3 mL Intravenous Q12H   traZODone  100 mg Oral QHS   Continuous Infusions:  sodium chloride Stopped (07/29/21 0217)   piperacillin-tazobactam (ZOSYN)  IV 3.375 g (07/29/21 1024)     LOS: 1 day      Time spent: 25 minutes   Dessa Phi, DO [facility] 07/29/2021, 3:07 PM   Available via Epic secure chat 7am-7pm After these hours, please refer to coverage provider listed on [email or web address]

## 2021-07-29 NOTE — Telephone Encounter (Signed)
The recall has been removed for ERCP as requested.

## 2021-07-29 NOTE — Progress Notes (Signed)
Patient ID: Ruben Chavez, male   DOB: [birth year], 70 y.o.   MRN: [id number]    Progress Note   Subjective   Day # 2 CC; cholangiocarcinoma, acute right upper quadrant pain  Today's labs-WBC 27.7/hemoglobin 12/hematocrit 36.5 T bili 3.3/alk phos 606/AST 25/ALT 35-continued improvement  Blood cultures negative so far  Patient feeling better today, not having severe pain and hiccups have resolved.  No complaints of nausea and would like to advance diet  -  Objective   Vital signs in last 24 hours: Temp:  [97.9 F (36.6 C)-100.5 F (38.1 C)] 98 F (36.7 C) (08/24 2094) Pulse Rate:  [83-104] 87 (08/24 0614) Resp:  [17-20] 17 (08/24 7096) BP: (87-134)/(50-74) 95/63 (08/24 2836) SpO2:  [95 %-98 %] 98 % (08/24 0614) Last BM Date: 07/26/21 General:    Chronically ill-appearing elderly white male in NAD-less jaundiced Heart:  Regular rate and rhythm; no murmurs Lungs: Respirations even and unlabored, lungs CTA bilaterally Abdomen:  Soft, mild tenderness in the right upper quadrant, no guarding ,and nondistended. Normal bowel sounds. Extremities:  Without edema. Neurologic:  Alert and oriented,  grossly normal neurologically. Psych:  Cooperative. Normal mood and affect.  Intake/Output from previous day: 08/23 0701 - 08/24 0700 In: 615 [I.V.:505.6; IV Piggyback:109.4] Out: -  Intake/Output this shift: No intake/output data recorded.  Lab Results: Recent Labs    07/27/21 2141 07/28/21 0849 07/29/21 0500  WBC 16.3* 26.7* 27.7*  HGB 11.1* 11.8* 12.0*  HCT 33.8* 36.7* 36.5*  PLT 430* 453* 450*   BMET Recent Labs    07/27/21 2141 07/28/21 0849 07/29/21 0500  NA 136 134* 135  K 4.3 4.1 4.1  CL 100 99 102  CO2 26 26 23   GLUCOSE 124* 132* 100*  BUN 14 12 21   CREATININE 0.63 0.66 1.03  CALCIUM 9.2 9.2 8.7*   LFT Recent Labs    07/29/21 0500  PROT 6.2*  ALBUMIN 2.4*  AST 25  ALT 35  ALKPHOS 606*  BILITOT 3.3*   PT/INR No results for input(s): LABPROT, INR  in the last 72 hours.  Studies/Results: DG Shoulder Right  Result Date: 07/27/2021 CLINICAL DATA:  Shoulder pain EXAM: RIGHT SHOULDER - 2+ VIEW COMPARISON:  None. FINDINGS: No acute bony abnormality. Specifically, no fracture, subluxation, or dislocation. Joint spaces maintained. Soft tissues are intact. IMPRESSION: No acute bony abnormality. Electronically Signed   By: Rolm Baptise M.D.   On: 07/27/2021 23:51   CT Abdomen Pelvis W Contrast  Result Date: 07/27/2021 CLINICAL DATA:  Abdominal pain, acute, nonlocalized Pain at cholecystostomy tube site EXAM: CT ABDOMEN AND PELVIS WITH CONTRAST TECHNIQUE: Multidetector CT imaging of the abdomen and pelvis was performed using the standard protocol following bolus administration of intravenous contrast. CONTRAST:  20m OMNIPAQUE IOHEXOL 350 MG/ML SOLN COMPARISON:  07/10/2021, MRI 06/23/2021 FINDINGS: Lower chest: Bibasilar atelectasis is present. Central venous catheter tip noted within the a right atrium. Global cardiac size within normal limits. Hepatobiliary: Internal biliary stent is again seen extending from the common duct within the pancreatic head into the a segment 2 biliary tree. There has been interval placement of a right internal/external biliary drainage catheter. There is mild infiltration of the perihepatic tissue adjacent to the a catheter exit site, however, there is no discrete drainable fluid collection identified. Right-sided intrahepatic biliary ductal dilation has improved. Mild left-sided biliary ductal dilation has developed within segment 2. Cyst within segment 4 of the liver is unchanged. The gallbladder is mildly distended, but is otherwise unremarkable.  Pancreas: Unremarkable Spleen: Unremarkable Adrenals/Urinary Tract: The adrenal glands are unremarkable. The kidneys are normal in size and position. Simple cortical cyst noted within the lower pole the right kidney. 3 mm nonobstructing calculus noted within the lower pole of the left  kidney. The bladder is largely obscured by streak artifact from bilateral total hip arthroplasty. Stomach/Bowel: Stomach and visualized small bowel are unremarkable. Appendix normal. Moderate stool noted within the ascending and transverse colon without evidence of obstruction. The large bowel is otherwise unremarkable. No free intraperitoneal gas or fluid. Vascular/Lymphatic: Extensive aortoiliac atherosclerotic calcification. No aortic aneurysm. No pathologic adenopathy within the abdomen and pelvis. Reproductive: Brachytherapy seeds noted within the prostate gland. Other: Probable bilateral inguinal hernia repair with mesh. Rectum unremarkable. Musculoskeletal: Bilateral total hip arthroplasty has been performed. Degenerative changes are seen within the lumbar spine. No acute bone abnormality. IMPRESSION: Interval right-sided internal/external biliary drainage catheter placement. Catheter appears propria Lea position with interval decrease in right intrahepatic biliary ductal dilation. Mild perihepatic soft tissue infiltration adjacent to the catheter exit site is noted, possibly postprocedural in nature, however, no discrete drainable fluid collection is identified. Stable internal biliary stent extending into segment 2 of the liver. Slight interval increase in intrahepatic biliary ductal dilation within segment 2 suggesting possible obstruction of the catheter. Moderate stool throughout the colon without evidence of obstruction. Minimal left nonobstructing nephrolithiasis Aortic Atherosclerosis (ICD10-I70.0). Electronically Signed   By: Fidela Salisbury M.D.   On: 07/27/2021 23:54   DG Chest Port 1 View  Result Date: 07/27/2021 CLINICAL DATA:  Leukocytosis EXAM: PORTABLE CHEST 1 VIEW COMPARISON:  07/10/2021 FINDINGS: Right Port-A-Cath is in place with the tip in the SVC. Heart is normal size. No confluent airspace opacities or effusions. No acute bony abnormality. IMPRESSION: No active disease. Electronically  Signed   By: Rolm Baptise M.D.   On: 07/27/2021 23:51       Assessment / Plan:     #106 70 year old white male with new diagnosis of cholangiocarcinoma, currently being followed at [facility].  Initial ERCP for diagnosis was done here with stent placement. Patient had persistent jaundice, underwent repeat ERCP 07/07/2021 with a 3 cm stricture at the common hepatic duct, previous stent was removed stricture was dilated and then he had placement of a 10 French by 12 cm stent into the left intrahepatic system, right system noted again to be completely obliterated. With persistence of hyperbilirubinemia he then went for percutaneous drainage of the right system on 07/23/2021 with placement of a 10 French internal/external drain-, drain was capped 24 hours later.  Patient presented here with acute severe right upper quadrant pain radiating into the right shoulder  This was associated with leukocytosis, but overall has had improvement in the LFTs  Patient on IV Zosyn Blood cultures negative thus far  He is feeling better and LFTs continue to improve though he has persistent significant leukocytosis.  With improvement in LFTs and is not felt that the stent in the left system is obstructed, may have seeded from recent percutaneous bile duct manipulation.  Plan; No plan for endoscopic intervention here at this time Continue IV Zosyn Await blood cultures Will advance to regular diet Continue baclofen for hiccups, and analgesics as needed. If patient does not wind up being transferred to [facility], he will need a follow-up appointment with GI there very quickly after discharge.     Active Problems:   Essential hypertension   Bile duct obstruction   Cholangitis     LOS: 1 day  Keshayla Schrum PA-C 07/29/2021, 8:40 AM

## 2021-07-30 DIAGNOSIS — R1084 Generalized abdominal pain: Secondary | ICD-10-CM

## 2021-07-30 LAB — COMPREHENSIVE METABOLIC PANEL
ALT: 29 U/L (ref 0–44)
AST: 24 U/L (ref 15–41)
Albumin: 2.2 g/dL — ABNORMAL LOW (ref 3.5–5.0)
Alkaline Phosphatase: 453 U/L — ABNORMAL HIGH (ref 38–126)
Anion gap: 8 (ref 5–15)
BUN: 17 mg/dL (ref 8–23)
CO2: 23 mmol/L (ref 22–32)
Calcium: 8.4 mg/dL — ABNORMAL LOW (ref 8.9–10.3)
Chloride: 103 mmol/L (ref 98–111)
Creatinine, Ser: 0.62 mg/dL (ref 0.61–1.24)
GFR, Estimated: >60 mL/min (ref >60)
Glucose, Bld: 100 mg/dL — ABNORMAL HIGH (ref 70–99)
Potassium: 3.2 mmol/L — ABNORMAL LOW (ref 3.5–5.1)
Sodium: 134 mmol/L — ABNORMAL LOW (ref 135–145)
Total Bilirubin: 2.7 mg/dL — ABNORMAL HIGH (ref 0.3–1.2)
Total Protein: 5.9 g/dL — ABNORMAL LOW (ref 6.5–8.1)

## 2021-07-30 LAB — CBC
HCT: 29 % — ABNORMAL LOW (ref 39.0–52.0)
Hemoglobin: 9.6 g/dL — ABNORMAL LOW (ref 13.0–17.0)
MCH: 30.3 pg (ref 26.0–34.0)
MCHC: 33.1 g/dL (ref 30.0–36.0)
MCV: 91.5 fL (ref 80.0–100.0)
Platelets: 322 10*3/uL (ref 150–400)
RBC: 3.17 MIL/uL — ABNORMAL LOW (ref 4.22–5.81)
RDW: 15.1 % (ref 11.5–15.5)
WBC: 15.3 10*3/uL — ABNORMAL HIGH (ref 4.0–10.5)
nRBC: 0 % (ref 0.0–0.2)

## 2021-07-30 MED ORDER — POTASSIUM CHLORIDE CRYS ER 20 MEQ PO TBCR
40.0000 meq | EXTENDED_RELEASE_TABLET | ORAL | Status: AC
Start: 2021-07-30 — End: 2021-07-30
  Administered 2021-07-30 (×2): 40 meq via ORAL
  Filled 2021-07-30 (×2): qty 2

## 2021-07-30 NOTE — Progress Notes (Signed)
Ruben Chavez phoned this morning stating still no bed available and that patient is still on wait list.

## 2021-07-30 NOTE — Progress Notes (Signed)
     [facility] Progress Note  CC:  Cholangiocarcinoma, acute RUQ abdominal pain  Subjective:  Feels much better.  Getting ready to eat breakfast.  Has a dull ache in the RUQ.  Hiccups still doing well on baclofen.  WBC much better today.  Objective:  Vital signs in last 24 hours: Temp:  [98.1 F (36.7 C)-98.2 F (36.8 C)] 98.2 F (36.8 C) (08/24 2016) Pulse Rate:  [78-94] 78 (08/25 0634) Resp:  [18-20] 20 (08/25 0634) BP: (96-134)/(62-63) 134/62 (08/25 0634) SpO2:  [100 %] 100 % (08/25 0634) Last BM Date: 07/26/21 General:  Alert, chronically ill-appearing, in NAD Heart:  Regular rate and rhythm; no murmurs Pulm:  CTAB.  No W/R/R. Abdomen:  Soft, non-distended.  BS present.  Minimal TTP in RUQ. Extremities:  Without edema. Neurologic:  Alert and  oriented x 4;  grossly normal neurologically. Psych:  Alert and cooperative. Normal mood and affect.  Intake/Output from previous day: 08/24 0701 - 08/25 0700 In: 235 [P.O.:235] Out: 1000 [Urine:1000]  Lab Results: Recent Labs    07/28/21 0849 07/29/21 0500 07/30/21 0514  WBC 26.7* 27.7* 15.3*  HGB 11.8* 12.0* 9.6*  HCT 36.7* 36.5* 29.0*  PLT 453* 450* 322   BMET Recent Labs    07/28/21 0849 07/29/21 0500 07/30/21 0514  NA 134* 135 134*  K 4.1 4.1 3.2*  CL 99 102 103  CO2 '26 23 23  '$ GLUCOSE 132* 100* 100*  BUN '12 21 17  '$ CREATININE 0.66 1.03 0.62  CALCIUM 9.2 8.7* 8.4*   LFT Recent Labs    07/30/21 0514  PROT 5.9*  ALBUMIN 2.2*  AST 24  ALT 29  ALKPHOS 453*  BILITOT 2.7*   Assessment / Plan: 70 year old white male with new diagnosis of cholangiocarcinoma, currently being followed at [facility]  Initial ERCP for diagnosis was done here with stent placement. Patient had persistent jaundice, underwent repeat ERCP 07/07/2021 with a 3 cm stricture at the common hepatic duct, previous stent was removed stricture was dilated and then he had placement of a 10 French by 12 cm stent into the  left intrahepatic system, right system noted again to be completely obliterated. With persistence of hyperbilirubinemia he then went for percutaneous drainage of the right system on 07/23/2021 with placement of a 10 French internal/external drain, drain was capped 24 hours later.   Patient presented here with acute severe right upper quadrant pain radiating into the right shoulder.   This was associated with leukocytosis, but overall has had improvement in the LFTs, which continue to improve and this morning, 8/25, leukocytosis is improved as well at 15.3K this morning down from 27.7K yesterday.   Patient on IV Zosyn. Blood cultures negative thus far. Pain is much better.   With improvement in LFTs and is not felt that the stent in the left system is obstructed, may have seeded from recent percutaneous bile duct manipulation.  **Continue current treatment plan with IV abx, pain medication, baclofen for hiccups, etc. **If he does not get transferred to [facility] then he will need a quick follow-up with then upon discharge.  As of this note a bed was still not available for transfer.    LOS: 2 days   Laban Emperor. Jaquarious Grey  07/30/2021, 9:50 AM

## 2021-07-30 NOTE — Progress Notes (Signed)
PROGRESS NOTE    Ruben Chavez  [id number] DOB: [birth year] DOA: 07/27/2021 PCP: Isaac Bliss, Rayford Halsted, MD     Brief Narrative:  Ruben Chavez is a 70 y.o. male with medical history significant of hypertension, recent diagnosis of cholangiocarcinoma and biliary obstruction status post biliary tube placement at [facility] on 07/23/2021 presented to ED with a complaint of right upper quadrant abdominal pain.  Patient was doing fine up until 4 days since his drain was placed.  He started having pain suddenly in the right upper quadrant, which was dull and severe and radiating to the right shoulder.  Per wife, he had subjective fever but temperature was 99, he had diaphoresis as well.  Did not have any other associated symptoms such as nausea, vomiting, constipation, diarrhea. CT abdomen pelvis with contrast showed stable internal biliary stent extending into segment 2 of the liver, slight interval increase in intrahepatic biliary duct dilatation within segment 2 suggesting possible obstruction of the catheter.  However the right-sided internal and external catheter looked okay.  Had elevated LFTs, alkaline phosphatase and bilirubin however compared to just 2 weeks ago, they were slightly better. ED physician discussed case with Dr. Melven Sartorius of oncology at [place], and based on his recommendations, will need IV abx and admission but no beds at this time.  Placed on transfer list.   New events last 24 hours / Subjective: WBC much better, has some soreness in the right upper quadrant.  No bed available at [place]:   Active Problems:   Essential hypertension   Bile duct obstruction   Cholangitis   Concern for cholangitis, either stent obstruction with intrahepatic biliary duct dilatation versus recent percutaneous bile duct manipulation History of cholangiocarcinoma with recent biliary drain placement -Continue IV Zosyn -Blood cultures negative to  date -GI following -LFT improved, GI does not feel the stent in the left system is obstructed, possibly seeded from recent percutaneous bile duct manipulation -Currently on waitlist to transfer to [place] -WBC improved  Essential hypertension -Continue lisinopril  Mood disorder -Continue Wellbutrin, trazodone  Hypokalemia -Replace, trend     DVT prophylaxis:  enoxaparin (LOVENOX) injection 40 mg Start: 07/28/21 2200  Code Status:     Code Status Orders  (From admission, onward)           Start     Ordered   07/28/21 1022  Full code  Continuous        07/28/21 1023           Code Status History     Date Active Date Inactive Code Status Order ID Comments User Context   07/10/2021 1730 07/13/2021 2121 Full Code [id number]  Jonnie Finner, DO ED   06/22/2021 2146 06/26/2021 2125 Full Code [id number]  Gwynne Edinger, MD ED   01/28/2020 1831 02/13/2020 1720 Full Code [id number]  Cathlyn Parsons, PA-C Inpatient   01/28/2020 1831 01/28/2020 1831 Full Code [id number]  Cathlyn Parsons, PA-C Inpatient   01/25/2020 0005 01/28/2020 1743 Full Code [id number]  Kerney Elbe, MD ED   01/18/2018 1921 01/19/2018 1937 Full Code [id number]  [name], MD Inpatient      Advance Directive Documentation    Flowsheet Row Most Recent Value  Type of Advance Directive Living will  Pre-existing out of facility DNR order (yellow form or pink MOST form) --  "MOST" Form in Place? --      Family Communication: No family at bedside Disposition Plan:  Status is: Inpatient  Remains inpatient appropriate because:IV treatments appropriate due to intensity of illness or inability to take PO  Dispo: The patient is from: Home              Anticipated d/c is to: Home              Patient currently is not medically stable to d/c.   Difficult to place patient No      Consultants:  GI   Antimicrobials:  Anti-infectives (From admission, onward)    Start     Dose/Rate Route  Frequency Ordered Stop   07/28/21 1000  piperacillin-tazobactam (ZOSYN) IVPB 3.375 g        3.375 g 12.5 mL/hr over 240 Minutes Intravenous Every 8 hours 07/28/21 0846     07/28/21 0045  vancomycin (VANCOCIN) IVPB 1000 mg/200 mL premix        1,000 mg 200 mL/hr over 60 Minutes Intravenous  Once 07/28/21 0038 07/28/21 0309   07/28/21 0045  piperacillin-tazobactam (ZOSYN) IVPB 3.375 g        3.375 g 100 mL/hr over 30 Minutes Intravenous  Once 07/28/21 0038 07/28/21 0238        Objective: Vitals:   07/29/21 2016 07/29/21 2132 07/30/21 0634 07/30/21 1004  BP: 116/63  134/62 (!) 120/50  Pulse: 94  78 75  Resp: '18  20 14  '$ Temp: 98.2 F (36.8 C)     TempSrc: Oral     SpO2: 100%  100% 99%  Height:  '5\' 9"'$  (1.753 m)      Intake/Output Summary (Last 24 hours) at 07/30/2021 1310 Last data filed at 07/30/2021 1223 Gross per 24 hour  Intake --  Output 1400 ml  Net -1400 ml    There were no vitals filed for this visit. Examination: General exam: Appears calm and comfortable, no acute distress Respiratory system: Clear to auscultation. Respiratory effort normal. Cardiovascular system: S1 & S2 heard, RRR. No pedal edema. Gastrointestinal system: Abdomen is nondistended, soft and nontender. Normal bowel sounds heard. + Biliary drain in place Central nervous system: Alert and oriented. Non focal exam. Speech clear  Extremities: Symmetric in appearance bilaterally  Skin: No rashes, lesions or ulcers on exposed skin  Psychiatry: Judgement and insight appear stable. Mood & affect appropriate.    Data Reviewed: I have personally reviewed following labs and imaging studies  CBC: Recent Labs  Lab 07/27/21 2141 07/28/21 0849 07/29/21 0500 07/30/21 0514  WBC 16.3* 26.7* 27.7* 15.3*  NEUTROABS 14.7* 24.8* 23.3*  --   HGB 11.1* 11.8* 12.0* 9.6*  HCT 33.8* 36.7* 36.5* 29.0*  MCV 93.6 92.9 91.9 91.5  PLT 430* 453* 450* [id number]    Basic Metabolic Panel: Recent Labs  Lab 07/27/21 2141  07/28/21 0849 07/29/21 0500 07/30/21 0514  NA 136 134* 135 134*  K 4.3 4.1 4.1 3.2*  CL 100 99 102 103  CO2 '26 26 23 23  '$ GLUCOSE 124* 132* 100* 100*  BUN '14 12 21 17  '$ CREATININE 0.63 0.66 1.03 0.62  CALCIUM 9.2 9.2 8.7* 8.4*  MG  --  1.9  --   --     GFR: CrCl cannot be calculated (Unknown ideal weight.). Liver Function Tests: Recent Labs  Lab 07/27/21 2141 07/28/21 0849 07/29/21 0500 07/30/21 0514  AST 49* '30 25 24  '$ ALT 64* 53* 35 29  ALKPHOS 937* 811* 606* 453*  BILITOT 3.9* 3.8* 3.3* 2.7*  PROT 7.5 7.0 6.2* 5.9*  ALBUMIN 3.2* 3.0* 2.4* 2.2*  Recent Labs  Lab 07/27/21 2141  LIPASE 30    No results for input(s): AMMONIA in the last 168 hours. Coagulation Profile: No results for input(s): INR, PROTIME in the last 168 hours. Cardiac Enzymes: No results for input(s): CKTOTAL, CKMB, CKMBINDEX, TROPONINI in the last 168 hours. BNP (last 3 results) No results for input(s): PROBNP in the last 8760 hours. HbA1C: No results for input(s): HGBA1C in the last 72 hours. CBG: No results for input(s): GLUCAP in the last 168 hours. Lipid Profile: No results for input(s): CHOL, HDL, LDLCALC, TRIG, CHOLHDL, LDLDIRECT in the last 72 hours. Thyroid Function Tests: No results for input(s): TSH, T4TOTAL, FREET4, T3FREE, THYROIDAB in the last 72 hours. Anemia Panel: No results for input(s): VITAMINB12, FOLATE, FERRITIN, TIBC, IRON, RETICCTPCT in the last 72 hours. Sepsis Labs: Recent Labs  Lab 07/29/21 1601 07/29/21 1854  LATICACIDVEN 1.0 1.5    Recent Results (from the past 240 hour(s))  Blood culture (routine x 2)     Status: None (Preliminary result)   Collection Time: 07/28/21  1:49 AM   Specimen: BLOOD RIGHT HAND  Result Value Ref Range Status   Specimen Description   Final    BLOOD RIGHT HAND Performed at [facility], [place] [address] [place], [place] [address]    Special Requests   Final    BOTTLES DRAWN AEROBIC AND ANAEROBIC Blood Culture adequate  volume Performed at [facility] [address] [place], [place] [address]    Culture   Final    NO GROWTH 2 DAYS Performed at [facility] [address], [place], [place] [address]    Report Status PENDING  Incomplete  Resp Panel by RT-PCR (Flu A&B, Covid) Nasopharyngeal Swab     Status: None   Collection Time: 07/28/21  1:51 AM   Specimen: Nasopharyngeal Swab; Nasopharyngeal(NP) swabs in vial transport medium  Result Value Ref Range Status   SARS Coronavirus 2 by RT PCR NEGATIVE NEGATIVE Final    Comment: (NOTE) SARS-CoV-2 target nucleic acids are NOT DETECTED.  The SARS-CoV-2 RNA is generally detectable in upper respiratory specimens during the acute phase of infection. The lowest concentration of SARS-CoV-2 viral copies this assay can detect is 138 copies/mL. A negative result does not preclude SARS-Cov-2 infection and should not be used as the sole basis for treatment or other patient management decisions. A negative result may occur with  improper specimen collection/handling, submission of specimen other than nasopharyngeal swab, presence of viral mutation(s) within the areas targeted by this assay, and inadequate number of viral copies(<138 copies/mL). A negative result must be combined with clinical observations, patient history, and epidemiological information. The expected result is Negative.  Fact Sheet for Patients:  EntrepreneurPulse.com.au  Fact Sheet for Healthcare Providers:  IncredibleEmployment.be  This test is no t yet approved or cleared by the [place] FDA and  has been authorized for detection and/or diagnosis of SARS-CoV-2 by FDA under an Emergency Use Authorization (EUA). This EUA will remain  in effect (meaning this test can be used) for the duration of the COVID-19 declaration under Section 564(b)(1) of the Act, 21 U.S.C.section 360bbb-3(b)(1), unless the authorization is terminated   or revoked sooner.       Influenza A by PCR NEGATIVE NEGATIVE Final   Influenza B by PCR NEGATIVE NEGATIVE Final    Comment: (NOTE) The Xpert Xpress SARS-CoV-2/FLU/RSV plus assay is intended as an aid in the diagnosis of influenza from Nasopharyngeal swab specimens and should  not be used as a sole basis for treatment. Nasal washings and aspirates are unacceptable for Xpert Xpress SARS-CoV-2/FLU/RSV testing.  Fact Sheet for Patients: EntrepreneurPulse.com.au  Fact Sheet for Healthcare Providers: IncredibleEmployment.be  This test is not yet approved or cleared by the [place] FDA and has been authorized for detection and/or diagnosis of SARS-CoV-2 by FDA under an Emergency Use Authorization (EUA). This EUA will remain in effect (meaning this test can be used) for the duration of the COVID-19 declaration under Section 564(b)(1) of the Act, 21 U.S.C. section 360bbb-3(b)(1), unless the authorization is terminated or revoked.  Performed at [facility], [place] [address] [place], [place] [address]   Blood culture (routine x 2)     Status: None (Preliminary result)   Collection Time: 07/28/21  2:05 AM   Specimen: BLOOD  Result Value Ref Range Status   Specimen Description   Final    BLOOD BLOOD RIGHT FOREARM Performed at [facility] [address] [place], [place] [address]    Special Requests   Final    BOTTLES DRAWN AEROBIC AND ANAEROBIC Blood Culture adequate volume Performed at [facility] [address], [place], [place] [address]    Culture   Final    NO GROWTH 2 DAYS Performed at [facility] [address] [place], [place] [address]    Report Status PENDING  Incomplete       Radiology Studies: No results found.    Scheduled Meds:  aspirin EC  325 mg Oral Daily   buPROPion  300 mg Oral Daily   Chlorhexidine Gluconate Cloth  6 each Topical Daily    enoxaparin (LOVENOX) injection  40 mg Subcutaneous [id number]   folic acid  1 mg Oral Daily   lisinopril  20 mg Oral Daily   pantoprazole  40 mg Oral Daily   sodium chloride flush  3 mL Intravenous Q12H   traZODone  100 mg Oral QHS   Continuous Infusions:  piperacillin-tazobactam (ZOSYN)  IV 3.375 g (07/30/21 0946)     LOS: 2 days      Time spent: 25 minutes   Dessa Phi, DO [facility] 07/30/2021, 1:10 PM   Available via Epic secure chat 7am-7pm After these hours, please refer to coverage provider listed on [email or web address]

## 2021-07-31 LAB — CBC
HCT: 28.4 % — ABNORMAL LOW (ref 39.0–52.0)
Hemoglobin: 9.4 g/dL — ABNORMAL LOW (ref 13.0–17.0)
MCH: 30.2 pg (ref 26.0–34.0)
MCHC: 33.1 g/dL (ref 30.0–36.0)
MCV: 91.3 fL (ref 80.0–100.0)
Platelets: 345 10*3/uL (ref 150–400)
RBC: 3.11 MIL/uL — ABNORMAL LOW (ref 4.22–5.81)
RDW: 15.3 % (ref 11.5–15.5)
WBC: 12.6 10*3/uL — ABNORMAL HIGH (ref 4.0–10.5)
nRBC: 0 % (ref 0.0–0.2)

## 2021-07-31 LAB — COMPREHENSIVE METABOLIC PANEL
ALT: 38 U/L (ref 0–44)
AST: 47 U/L — ABNORMAL HIGH (ref 15–41)
Albumin: 2.2 g/dL — ABNORMAL LOW (ref 3.5–5.0)
Alkaline Phosphatase: 499 U/L — ABNORMAL HIGH (ref 38–126)
Anion gap: 5 (ref 5–15)
BUN: 14 mg/dL (ref 8–23)
CO2: 22 mmol/L (ref 22–32)
Calcium: 8.5 mg/dL — ABNORMAL LOW (ref 8.9–10.3)
Chloride: 108 mmol/L (ref 98–111)
Creatinine, Ser: 0.46 mg/dL — ABNORMAL LOW (ref 0.61–1.24)
GFR, Estimated: >60 mL/min (ref >60)
Glucose, Bld: 110 mg/dL — ABNORMAL HIGH (ref 70–99)
Potassium: 4 mmol/L (ref 3.5–5.1)
Sodium: 135 mmol/L (ref 135–145)
Total Bilirubin: 2.2 mg/dL — ABNORMAL HIGH (ref 0.3–1.2)
Total Protein: 5.8 g/dL — ABNORMAL LOW (ref 6.5–8.1)

## 2021-07-31 MED ORDER — AMOXICILLIN-POT CLAVULANATE 875-125 MG PO TABS
1.0000 | ORAL_TABLET | Freq: Two times a day (BID) | ORAL | Status: DC
  Administered 2021-07-31 – 2021-08-01 (×4): 1 via ORAL
  Filled 2021-07-31 (×4): qty 1

## 2021-07-31 NOTE — Care Management Important Message (Signed)
Medicare IM printed for [facility] [employer] to give to the patient. 

## 2021-07-31 NOTE — Progress Notes (Signed)
PROGRESS NOTE    Ruben Chavez  [id number] DOB: 1951/06/11 DOA: 07/27/2021 PCP: Isaac Bliss, Rayford Halsted, MD     Brief Narrative:  Ruben Chavez is a 70 y.o. male with medical history significant of hypertension, recent diagnosis of cholangiocarcinoma and biliary obstruction status post biliary tube placement at [facility] on 07/23/2021 presented to ED with a complaint of right upper quadrant abdominal pain.  Patient was doing fine up until 4 days since his drain was placed.  He started having pain suddenly in the right upper quadrant, which was dull and severe and radiating to the right shoulder.  Per wife, he had subjective fever but temperature was 99, he had diaphoresis as well.  Did not have any other associated symptoms such as nausea, vomiting, constipation, diarrhea. CT abdomen pelvis with contrast showed stable internal biliary stent extending into segment 2 of the liver, slight interval increase in intrahepatic biliary duct dilatation within segment 2 suggesting possible obstruction of the catheter.  However the right-sided internal and external catheter looked okay.  Had elevated LFTs, alkaline phosphatase and bilirubin however compared to just 2 weeks ago, they were slightly better. ED physician discussed case with Dr. Melven Sartorius of oncology at [facility], and based on his recommendations, will need IV abx and admission but no beds at this time.  Placed on transfer list.   New events last 24 hours / Subjective: White count continues to improve.  Still no word on bed availability at [facility] that he has not been out of bed at all during hospital stay.  Assessment & Plan:   Active Problems:   Essential hypertension   Bile duct obstruction   Cholangitis   Concern for cholangitis, either stent obstruction with intrahepatic biliary duct dilatation versus recent percutaneous bile duct manipulation History of cholangiocarcinoma with recent biliary drain  placement -IV Zosyn --> Augmentin -Blood cultures negative to date -GI signed off 8/25 -LFT improved, GI does not feel the stent in the left system is obstructed, possibly seeded from recent percutaneous bile duct manipulation -WBC improved  Essential hypertension -Continue lisinopril  Mood disorder -Continue Wellbutrin, trazodone    DVT prophylaxis:  enoxaparin (LOVENOX) injection 40 mg Start: 07/28/21 2200  Code Status:     Code Status Orders  (From admission, onward)           Start     Ordered   07/28/21 1022  Full code  Continuous        07/28/21 1023           Code Status History     Date Active Date Inactive Code Status Order ID Comments User Context   07/10/2021 1730 07/13/2021 2121 Full Code [id number]  Jonnie Finner, DO ED   06/22/2021 2146 06/26/2021 2125 Full Code [id number]  Gwynne Edinger, MD ED   01/28/2020 1831 02/13/2020 1720 Full Code [id number]  Cathlyn Parsons, PA-C Inpatient   01/28/2020 1831 01/28/2020 1831 Full Code [id number]  Cathlyn Parsons, PA-C Inpatient   01/25/2020 0005 01/28/2020 1743 Full Code [id number]  Kerney Elbe, MD ED   01/18/2018 1921 01/19/2018 1937 Full Code [id number]  [name], MD Inpatient      Advance Directive Documentation    Flowsheet Row Most Recent Value  Type of Advance Directive Living will  Pre-existing out of facility DNR order (yellow form or pink MOST form) --  "MOST" Form in Place? --      Family Communication: No family at bedside Disposition Plan:  Status is: Inpatient  Remains inpatient appropriate because:Unsafe d/c plan  Dispo: The patient is from: Home              Anticipated d/c is to: Home              Patient currently is not medically stable to d/c.  Need PT   Difficult to place patient No      Consultants:  GI   Antimicrobials:  Anti-infectives (From admission, onward)    Start     Dose/Rate Route Frequency Ordered Stop   07/31/21 1000  amoxicillin-clavulanate  (AUGMENTIN) 875-125 MG per tablet 1 tablet        1 tablet Oral Every 12 hours 07/31/21 0855     07/28/21 1000  piperacillin-tazobactam (ZOSYN) IVPB 3.375 g  Status:  Discontinued        3.375 g 12.5 mL/hr over 240 Minutes Intravenous Every 8 hours 07/28/21 0846 07/31/21 0855   07/28/21 0045  vancomycin (VANCOCIN) IVPB 1000 mg/200 mL premix        1,000 mg 200 mL/hr over 60 Minutes Intravenous  Once 07/28/21 0038 07/28/21 0309   07/28/21 0045  piperacillin-tazobactam (ZOSYN) IVPB 3.375 g        3.375 g 100 mL/hr over 30 Minutes Intravenous  Once 07/28/21 0038 07/28/21 0238        Objective: Vitals:   07/30/21 2041 07/31/21 0500 07/31/21 0515 07/31/21 0810  BP: (!) 132/46  (!) 167/68 (!) 146/86  Pulse: 89  84 85  Resp: 18  18   Temp:   97.9 F (36.6 C) (!) 97.5 F (36.4 C)  TempSrc:   Oral Oral  SpO2: 98%  97% 99%  Weight:  67.9 kg    Height:        Intake/Output Summary (Last 24 hours) at 07/31/2021 1533 Last data filed at 07/31/2021 1500 Gross per 24 hour  Intake 650.57 ml  Output 1550 ml  Net -899.43 ml    Filed Weights   07/31/21 0500  Weight: 67.9 kg   Examination: General exam: Appears calm and comfortable  Respiratory system: Clear to auscultation. Respiratory effort normal. Cardiovascular system: S1 & S2 heard, RRR. No pedal edema. Gastrointestinal system: Abdomen is nondistended, soft and nontender. Normal bowel sounds heard. Central nervous system: Alert and oriented. Non focal exam. Speech clear  Extremities: Symmetric in appearance bilaterally  Skin: No rashes, lesions or ulcers on exposed skin  Psychiatry: Judgement and insight appear stable. Mood & affect appropriate.     Data Reviewed: I have personally reviewed following labs and imaging studies  CBC: Recent Labs  Lab 07/27/21 2141 07/28/21 0849 07/29/21 0500 07/30/21 0514 07/31/21 0520  WBC 16.3* 26.7* 27.7* 15.3* 12.6*  NEUTROABS 14.7* 24.8* 23.3*  --   --   HGB 11.1* 11.8* 12.0* 9.6*  9.4*  HCT 33.8* 36.7* 36.5* 29.0* 28.4*  MCV 93.6 92.9 91.9 91.5 91.3  PLT 430* 453* 450* 322 [id number]    Basic Metabolic Panel: Recent Labs  Lab 07/27/21 2141 07/28/21 0849 07/29/21 0500 07/30/21 0514 07/31/21 0520  NA 136 134* 135 134* 135  K 4.3 4.1 4.1 3.2* 4.0  CL 100 99 102 103 108  CO2 '26 26 23 23 22  '$ GLUCOSE 124* 132* 100* 100* 110*  BUN '14 12 21 17 14  '$ CREATININE 0.63 0.66 1.03 0.62 0.46*  CALCIUM 9.2 9.2 8.7* 8.4* 8.5*  MG  --  1.9  --   --   --  GFR: Estimated Creatinine Clearance: 81.3 mL/min (A) (by C-G formula based on SCr of 0.46 mg/dL (L)). Liver Function Tests: Recent Labs  Lab 07/27/21 2141 07/28/21 0849 07/29/21 0500 07/30/21 0514 07/31/21 0520  AST 49* '30 25 24 '$ 47*  ALT 64* 53* 35 29 38  ALKPHOS 937* 811* 606* 453* 499*  BILITOT 3.9* 3.8* 3.3* 2.7* 2.2*  PROT 7.5 7.0 6.2* 5.9* 5.8*  ALBUMIN 3.2* 3.0* 2.4* 2.2* 2.2*    Recent Labs  Lab 07/27/21 2141  LIPASE 30    No results for input(s): AMMONIA in the last 168 hours. Coagulation Profile: No results for input(s): INR, PROTIME in the last 168 hours. Cardiac Enzymes: No results for input(s): CKTOTAL, CKMB, CKMBINDEX, TROPONINI in the last 168 hours. BNP (last 3 results) No results for input(s): PROBNP in the last 8760 hours. HbA1C: No results for input(s): HGBA1C in the last 72 hours. CBG: No results for input(s): GLUCAP in the last 168 hours. Lipid Profile: No results for input(s): CHOL, HDL, LDLCALC, TRIG, CHOLHDL, LDLDIRECT in the last 72 hours. Thyroid Function Tests: No results for input(s): TSH, T4TOTAL, FREET4, T3FREE, THYROIDAB in the last 72 hours. Anemia Panel: No results for input(s): VITAMINB12, FOLATE, FERRITIN, TIBC, IRON, RETICCTPCT in the last 72 hours. Sepsis Labs: Recent Labs  Lab 07/29/21 1601 07/29/21 1854  LATICACIDVEN 1.0 1.5     Recent Results (from the past 240 hour(s))  Blood culture (routine x 2)     Status: None (Preliminary result)   Collection  Time: 07/28/21  1:49 AM   Specimen: BLOOD RIGHT HAND  Result Value Ref Range Status   Specimen Description   Final    BLOOD RIGHT HAND Performed at [facility], [place] [address], [place], [place] [address]    Special Requests   Final    BOTTLES DRAWN AEROBIC AND ANAEROBIC Blood Culture adequate volume Performed at [facility] [address], [place], [place] [address]    Culture   Final    NO GROWTH 3 DAYS Performed at [facility], [place] [address], [place], [place] [address]    Report Status PENDING  Incomplete  Resp Panel by RT-PCR (Flu A&B, Covid) Nasopharyngeal Swab     Status: None   Collection Time: 07/28/21  1:51 AM   Specimen: Nasopharyngeal Swab; Nasopharyngeal(NP) swabs in vial transport medium  Result Value Ref Range Status   SARS Coronavirus 2 by RT PCR NEGATIVE NEGATIVE Final    Comment: (NOTE) SARS-CoV-2 target nucleic acids are NOT DETECTED.  The SARS-CoV-2 RNA is generally detectable in upper respiratory specimens during the acute phase of infection. The lowest concentration of SARS-CoV-2 viral copies this assay can detect is 138 copies/mL. A negative result does not preclude SARS-Cov-2 infection and should not be used as the sole basis for treatment or other patient management decisions. A negative result may occur with  improper specimen collection/handling, submission of specimen other than nasopharyngeal swab, presence of viral mutation(s) within the areas targeted by this assay, and inadequate number of viral copies(<138 copies/mL). A negative result must be combined with clinical observations, patient history, and epidemiological information. The expected result is Negative.  Fact Sheet for Patients:  EntrepreneurPulse.com.au  Fact Sheet for Healthcare Providers:  IncredibleEmployment.be  This test is no t yet approved or cleared by the [place] FDA and  has been authorized for  detection and/or diagnosis of SARS-CoV-2 by FDA under an Emergency Use Authorization (EUA). This EUA will remain  in effect (  meaning this test can be used) for the duration of the COVID-19 declaration under Section 564(b)(1) of the Act, 21 U.S.C.section 360bbb-3(b)(1), unless the authorization is terminated  or revoked sooner.       Influenza A by PCR NEGATIVE NEGATIVE Final   Influenza B by PCR NEGATIVE NEGATIVE Final    Comment: (NOTE) The Xpert Xpress SARS-CoV-2/FLU/RSV plus assay is intended as an aid in the diagnosis of influenza from Nasopharyngeal swab specimens and should not be used as a sole basis for treatment. Nasal washings and aspirates are unacceptable for Xpert Xpress SARS-CoV-2/FLU/RSV testing.  Fact Sheet for Patients: EntrepreneurPulse.com.au  Fact Sheet for Healthcare Providers: IncredibleEmployment.be  This test is not yet approved or cleared by the [place] FDA and has been authorized for detection and/or diagnosis of SARS-CoV-2 by FDA under an Emergency Use Authorization (EUA). This EUA will remain in effect (meaning this test can be used) for the duration of the COVID-19 declaration under Section 564(b)(1) of the Act, 21 U.S.C. section 360bbb-3(b)(1), unless the authorization is terminated or revoked.  Performed at [facility], [place] [address], [place], [place] [address]   Blood culture (routine x 2)     Status: None (Preliminary result)   Collection Time: 07/28/21  2:05 AM   Specimen: BLOOD  Result Value Ref Range Status   Specimen Description   Final    BLOOD BLOOD RIGHT FOREARM Performed at [facility] [address], [place], [place] [address]    Special Requests   Final    BOTTLES DRAWN AEROBIC AND ANAEROBIC Blood Culture adequate volume Performed at [facility] [address], [place], [place] [address]    Culture   Final    NO GROWTH 3  DAYS Performed at [facility], [place] [address] [place], [place] [address]    Report Status PENDING  Incomplete       Radiology Studies: No results found.    Scheduled Meds:  amoxicillin-clavulanate  1 tablet Oral Q12H   aspirin EC  325 mg Oral Daily   buPROPion  300 mg Oral Daily   Chlorhexidine Gluconate Cloth  6 each Topical Daily   enoxaparin (LOVENOX) injection  40 mg Subcutaneous [id number]   folic acid  1 mg Oral Daily   lisinopril  20 mg Oral Daily   pantoprazole  40 mg Oral Daily   sodium chloride flush  3 mL Intravenous Q12H   traZODone  100 mg Oral QHS   Continuous Infusions:     LOS: 3 days      Time spent: 25 minutes   Dessa Phi, DO [facility] 07/31/2021, 3:33 PM   Available via Epic secure chat 7am-7pm After these hours, please refer to coverage provider listed on [email or web address]

## 2021-07-31 NOTE — Evaluation (Addendum)
Physical Therapy Evaluation Patient Details Name: Ruben Chavez MRN: [id number] DOB: 06-05-1951 Today's Date: 07/31/2021   History of Present Illness  Pt is a 70 y.o. male who presented to ED with a complaint of right upper quadrant abdominal pain with bile duct obstruction and Cholangitis. Past medical history significant of hypertension, recent diagnosis of cholangiocarcinoma and biliary obstruction status post biliary tube placement at [facility] on 07/23/2021.  Clinical Impression  Pt is a 70 y.o. male with above HPI. Pt reports he was independent with use of SPC and for performance of ADLs prior to admission with intermittent assist from his wife for bed mobility and transfers. Pt required MOD A for bed mobility with MIN-MOD A +2 for power up and stability with sit to stand transfers. Pt unable to progress to ambulation due to significantly decreased activity tolerance and high fall risk due to instability. Pt reports that he lives with his wife and his son lives in the area, but works during the day and may be able to provide intermittent assist. Recommend SNF at this time as pt requires increased assistance for performance and safety with all mobility and has decreased caregiver support for current mobility needs. PT discussed rehab stay upon d/c with pt and his wife, pt and wife expressing goal is to go home or potentially waiting on bed at [facility] for further medical management?. If pt and family decide to go home he will need 24/7 supervision and HHPT, pt would be appropriate for PTAR transport to maintain safety if going home today. Pt will benefit from continued skilled PT to increase his independence and maximize safety with mobility.      Follow Up Recommendations SNF (if pt and family decline 24/7 supervision and HHPT)    Equipment Recommendations  None recommended by PT    Recommendations for Other Services       Precautions / Restrictions  Precautions Precautions: Fall Precaution Comments: Left sided hemiparesis, UE (Stroke ~Feb 2021) Required Braces or Orthoses:  (Pt wearing brace on L hand) Restrictions Weight Bearing Restrictions: No      Mobility  Bed Mobility Overal bed mobility: Needs Assistance Bed Mobility: Supine to Sit     Supine to sit: Mod assist;HOB elevated     General bed mobility comments: Pt required MOD A to bring trunk to upright and hips to midline, assist at chuck pad to assist with scooting for feet flat in prep for sit to stand, pt able to initate scooting movement. Pt required MIN-MOD A for seated balance, pt with posterior and Rt lateral lean, requiring intermittent physical assist and verbal cues to correct with difficulty maintaining    Transfers Overall transfer level: Needs assistance Equipment used: 2 person hand held assist Transfers: Sit to/from Stand Sit to Stand: Min assist;+2 safety/equipment         General transfer comment: pt providing information for how his wife assists him at home, stating "she doesn't always have to do this, only sometimes". MIN A+2 for safety with HHA on R for power up to stand. Pt with standing tolerance of 3-5s before requesting to sit. MIN-MOD A+2 for maintenance of standing balance, pt with heavy lean to the Rt into therapist in standing and with B LEs intermittently supported against bed.  Ambulation/Gait                Stairs            Wheelchair Mobility    Modified Rankin (  Stroke Patients Only)       Balance Overall balance assessment: Needs assistance Sitting-balance support: Feet supported;Single extremity supported Sitting balance-Leahy Scale: Poor Sitting balance - Comments: MIN-MOD A to maintain upright seated balance   Standing balance support: Bilateral upper extremity supported;During functional activity Standing balance-Leahy Scale: Poor Standing balance comment: reliant on +2 support to maintain standing                              Pertinent Vitals/Pain Pain Assessment: No/denies pain    Home Living Family/patient expects to be discharged to:: Private residence Living Arrangements: Spouse/significant other Available Help at Discharge: Family;Available 24 hours/day Type of Home: House (townhome) Home Access: Stairs to enter Entrance Stairs-Rails: None Entrance Stairs-Number of Steps: 2 Home Layout: 1/2 bath on main level;Two level Home Equipment: Cane - single point;Walker - 2 wheels;Bedside commode;Wheelchair - manual;Hospital bed (urinal) Additional Comments: Pt states that he was walking with cane prior to admission. Lives with his wife, who is a retired Haematologist and son lives nearby. Pt typically lives on 2nd floor, but has rented hospital bed and will be staying on first floor when he returns home.    Prior Function Level of Independence: Independent with assistive device(s)   Gait / Transfers Assistance Needed: use of cane     Comments: (I) with feeding, bathing, dressing as well. States that wife helps him intermittently with bed mobility if needed and does home management, cooking tasks. Pt's wife states that "ambulance has had to come about 6 times to come get him off the floor"     Hand Dominance   Dominant Hand: Right    Extremity/Trunk Assessment   Upper Extremity Assessment Upper Extremity Assessment: Generalized weakness    Lower Extremity Assessment Lower Extremity Assessment: Generalized weakness    Cervical / Trunk Assessment Cervical / Trunk Assessment: Kyphotic  Communication   Communication: No difficulties  Cognition Arousal/Alertness: Awake/alert Behavior During Therapy: WFL for tasks assessed/performed;Agitated Overall Cognitive Status: Within Functional Limits for tasks assessed                                 General Comments: Pt requiring with increased time and set up for mobility, slow and carefully planned movements       General Comments      Exercises     Assessment/Plan    PT Assessment Patient needs continued PT services  PT Problem List Decreased strength;Decreased activity tolerance;Decreased balance;Decreased mobility       PT Treatment Interventions DME instruction;Therapeutic activities;Gait training;Functional mobility training;Therapeutic exercise;Patient/family education;Balance training    PT Goals (Current goals can be found in the Care Plan section)  Acute Rehab PT Goals Patient Stated Goal: get stronger, recover quickly and be able to go to [place] PT Goal Formulation: With patient/family Time For Goal Achievement: 08/14/21 Potential to Achieve Goals: Good    Frequency Min 3X/week   Barriers to discharge        Co-evaluation               AM-PAC PT "6 Clicks" Mobility  Outcome Measure Help needed turning from your back to your side while in a flat bed without using bedrails?: A Little Help needed moving from lying on your back to sitting on the side of a flat bed without using bedrails?: A Lot Help needed moving to and from a  bed to a chair (including a wheelchair)?: A Lot Help needed standing up from a chair using your arms (e.g., wheelchair or bedside chair)?: A Lot Help needed to walk in hospital room?: Total Help needed climbing 3-5 steps with a railing? : Total 6 Click Score: 11    End of Session   Activity Tolerance: Patient limited by fatigue Patient left: in bed;with call bell/phone within reach;with bed alarm set;with family/visitor present Nurse Communication: Mobility status PT Visit Diagnosis: Unsteadiness on feet (R26.81);Muscle weakness (generalized) (M62.81);Other abnormalities of gait and mobility (R26.89)    Time: [id number] PT Time Calculation (min) (ACUTE ONLY): 42 min   Charges:   PT Evaluation $PT Eval Moderate Complexity: 1 Mod PT Treatments $Therapeutic Activity: 23-37 mins        Festus Barren PT, DPT  Acute Rehabilitation  Services  Office [phone]   07/31/2021, 4:59 PM

## 2021-07-31 NOTE — Care Management Important Message (Signed)
Important Message  Patient Details  Name: MARLEY GREULICH MRN: [id number] Date of Birth: 1951-07-19   Medicare Important Message Given:  Yes     Sherie Don, [place] 07/31/2021, 1:55 PM

## 2021-08-01 LAB — COMPREHENSIVE METABOLIC PANEL
ALT: 52 U/L — ABNORMAL HIGH (ref 0–44)
AST: 60 U/L — ABNORMAL HIGH (ref 15–41)
Albumin: 2.4 g/dL — ABNORMAL LOW (ref 3.5–5.0)
Alkaline Phosphatase: 589 U/L — ABNORMAL HIGH (ref 38–126)
Anion gap: 8 (ref 5–15)
BUN: 12 mg/dL (ref 8–23)
CO2: 21 mmol/L — ABNORMAL LOW (ref 22–32)
Calcium: 8.4 mg/dL — ABNORMAL LOW (ref 8.9–10.3)
Chloride: 101 mmol/L (ref 98–111)
Creatinine, Ser: 0.52 mg/dL — ABNORMAL LOW (ref 0.61–1.24)
GFR, Estimated: >60 mL/min (ref >60)
Glucose, Bld: 102 mg/dL — ABNORMAL HIGH (ref 70–99)
Potassium: 3.6 mmol/L (ref 3.5–5.1)
Sodium: 130 mmol/L — ABNORMAL LOW (ref 135–145)
Total Bilirubin: 2 mg/dL — ABNORMAL HIGH (ref 0.3–1.2)
Total Protein: 6.4 g/dL — ABNORMAL LOW (ref 6.5–8.1)

## 2021-08-01 LAB — CBC
HCT: 29.6 % — ABNORMAL LOW (ref 39.0–52.0)
Hemoglobin: 9.8 g/dL — ABNORMAL LOW (ref 13.0–17.0)
MCH: 29.7 pg (ref 26.0–34.0)
MCHC: 33.1 g/dL (ref 30.0–36.0)
MCV: 89.7 fL (ref 80.0–100.0)
Platelets: 419 10*3/uL — ABNORMAL HIGH (ref 150–400)
RBC: 3.3 MIL/uL — ABNORMAL LOW (ref 4.22–5.81)
RDW: 15.1 % (ref 11.5–15.5)
WBC: 12.1 10*3/uL — ABNORMAL HIGH (ref 4.0–10.5)
nRBC: 0 % (ref 0.0–0.2)

## 2021-08-01 LAB — AMMONIA: Ammonia: 81 umol/L — ABNORMAL HIGH (ref 9–35)

## 2021-08-01 MED ORDER — LACTULOSE 10 GM/15ML PO SOLN
20.0000 g | Freq: Two times a day (BID) | ORAL | Status: DC
  Administered 2021-08-01 – 2021-08-03 (×5): 20 g via ORAL
  Filled 2021-08-01 (×5): qty 30

## 2021-08-01 MED ORDER — DOCUSATE SODIUM 100 MG PO CAPS
100.0000 mg | ORAL_CAPSULE | Freq: Two times a day (BID) | ORAL | Status: DC
  Administered 2021-08-01 – 2021-08-03 (×5): 100 mg via ORAL
  Filled 2021-08-01 (×5): qty 1

## 2021-08-01 MED ORDER — POLYETHYLENE GLYCOL 3350 17 G PO PACK
17.0000 g | PACK | Freq: Every day | ORAL | Status: DC | PRN
  Administered 2021-08-01: 17 g via ORAL
  Filled 2021-08-01: qty 1

## 2021-08-01 NOTE — TOC Initial Note (Signed)
Transition of Care [facility]) - Initial/Assessment Note    Patient Details  Name: Ruben Chavez MRN: [id number] Date of Birth: March 25, 1951  Transition of Care [facility]) CM/SW Contact:    Lynnell Catalan, RN Phone Number: 08/01/2021, 10:41 AM  Clinical Narrative:                 Pt from home with spouse. Per nursing staff, after wife saw pt work with physical therapy she is agreeable to short term SNF. FL2 faxed out to area SNFs. Auth started with [facility] for SNF and ambulance transport. TOC will continue to follow.  Expected Discharge Plan: Skilled Nursing Facility Barriers to Discharge: Continued Medical Work up   Expected Discharge Plan and Services Expected Discharge Plan: [place]   Discharge Planning Services: CM Consult     Expected Discharge Date:  (unknown)                  Prior Living Arrangements/Services   Lives with:: Spouse Patient language and need for interpreter reviewed:: Yes        Need for Family Participation in Patient Care: Yes (Comment) Care giver support system in place?: Yes (comment)   Criminal Activity/Legal Involvement Pertinent to Current Situation/Hospitalization: No - Comment as needed  Activities of Daily Living Home Assistive Devices/Equipment: Cane (specify quad or straight), Wheelchair, Other (Comment) (single point cane, walk-in shower) ADL Screening (condition at time of admission) Patient's cognitive ability adequate to safely complete daily activities?: Yes Is the patient deaf or have difficulty hearing?: No Does the patient have difficulty seeing, even when wearing glasses/contacts?: No Does the patient have difficulty concentrating, remembering, or making decisions?: Yes Patient able to express need for assistance with ADLs?: Yes Does the patient have difficulty dressing or bathing?: Yes Independently performs ADLs?: No Communication: Independent Dressing (OT): Needs assistance Is this a change from baseline?:  Pre-admission baseline Grooming: Needs assistance Is this a change from baseline?: Pre-admission baseline Feeding: Needs assistance Is this a change from baseline?: Pre-admission baseline Bathing: Needs assistance Is this a change from baseline?: Pre-admission baseline Toileting: Needs assistance Is this a change from baseline?: Pre-admission baseline In/Out Bed: Needs assistance Is this a change from baseline?: Pre-admission baseline Walks in Home: Needs assistance Is this a change from baseline?: Pre-admission baseline Does the patient have difficulty walking or climbing stairs?: Yes (secondary to weakness) Weakness of Legs: Both Weakness of Arms/Hands: None  Permission Sought/Granted                  Emotional Assessment       Orientation: : Oriented to Self, Oriented to Place, Oriented to  Time, Oriented to Situation      Admission diagnosis:  Cholangitis [K83.09] Abdominal pain, unspecified abdominal location [R10.9] Patient Active Problem List   Diagnosis Date Noted   Cholangitis 07/28/2021   SIRS (systemic inflammatory response syndrome) ([place]) 07/10/2021   Obstructive jaundice due to malignant neoplasm ([place]) 06/24/2021   Klatskin's tumor ([place]) 06/24/2021   Malnutrition of moderate degree 06/24/2021   Abnormal abdominal CT scan 06/19/2021   Jaundice 06/19/2021   Weight loss, unintentional 06/19/2021   Bile duct obstruction 06/19/2021   Left hemiparesis ([place]) 03/28/2020   Chronic bilateral low back pain without sciatica 02/25/2020   Spasticity as late effect of cerebrovascular accident (CVA) 02/25/2020   Cognitive and neurobehavioral dysfunction    Hypertensive urgency 01/28/2020   Urinary retention 01/28/2020   Right middle cerebral artery stroke ([place]) 01/28/2020   Stroke (cerebrum) ([place]) -  [name] 01/25/2020   Acute respiratory failure with hypoxia  (HCC)    Hypotension    Hypokalemia    Hypocalcemia    Encephalopathy acute    Stroke ([place]) 01/24/2020   Middle cerebral artery embolism, right 01/24/2020   Hyperlipidemia 02/21/2019   Impingement syndrome of right shoulder region 02/21/2018   OA (osteoarthritis) of hip 01/18/2018   Prostate cancer ([place]) 03/04/2016   Essential hypertension 11/27/2014   History of colonic polyps 09/26/2014   BACK PAIN, LEFT 12/22/2009   PROSTATE SPECIFIC ANTIGEN, ELEVATED 12/20/2008   LATERAL EPICONDYLITIS 07/15/2008   ACTINIC KERATOSIS, FOREHEAD, LEFT 05/23/2008   WART, LEFT HAND 07/19/2007   ERECTILE DYSFUNCTION, MILD 07/19/2007   HERNIA, BILATERAL INGUINAL W/O OBST/GANGRENE 07/19/2007   MENISCUS TEAR 07/19/2007   Bilateral inguinal hernia 07/19/2007   PCP:  Isaac Bliss, Rayford Halsted, MD Pharmacy:   [facility] [place], [name] [address] Phone: [phone] Fax: [phone]     Social Determinants of Health (SDOH) Interventions    Readmission Risk Interventions Readmission Risk Prevention Plan 08/01/2021  Transportation Screening Complete  PCP or Specialist Appt within 3-5 Days Complete  HRI or Home Care Consult Complete  Social Work Consult for [place] Planning/Counseling Complete  Palliative Care Screening Not Applicable  Medication Review Press photographer) Complete  Some recent data might be hidden

## 2021-08-01 NOTE — Progress Notes (Addendum)
PROGRESS NOTE    Ruben Chavez  [id number] DOB: [birth year] DOA: 07/27/2021 PCP: Isaac Bliss, Rayford Halsted, MD     Brief Narrative:  Ruben Chavez is a 70 y.o. male with medical history significant of hypertension, recent diagnosis of cholangiocarcinoma and biliary obstruction status post biliary tube placement at [facility] on 07/23/2021 presented to ED with a complaint of right upper quadrant abdominal pain.  Patient was doing fine up until 4 days since his drain was placed.  He started having pain suddenly in the right upper quadrant, which was dull and severe and radiating to the right shoulder.  Per wife, he had subjective fever but temperature was 99, he had diaphoresis as well.  Did not have any other associated symptoms such as nausea, vomiting, constipation, diarrhea. CT abdomen pelvis with contrast showed stable internal biliary stent extending into segment 2 of the liver, slight interval increase in intrahepatic biliary duct dilatation within segment 2 suggesting possible obstruction of the catheter.  However the right-sided internal and external catheter looked okay.  Had elevated LFTs, alkaline phosphatase and bilirubin however compared to just 2 weeks ago, they were slightly better. ED physician discussed case with Dr. Melven Sartorius of oncology at [place], and based on his recommendations, will need IV abx and admission but no beds at this time.  Placed on transfer list.   New events last 24 hours / Subjective: Patient was sleeping and was woken up by me for examination.  Patient states that he felt like he was in the twilight zone, had some confusion overnight and disorientation where he had initially thought that he had gone home and come back to the hospital.  He has no physical complaints on examination this morning.  I called and spoke with [place] transfer line, patient remains on wait list, no beds yet.  They will call us daily with update.  Assessment &  Plan:   Active Problems:   Essential hypertension   Bile duct obstruction   Cholangitis   Concern for cholangitis, either stent obstruction with intrahepatic biliary duct dilatation versus recent percutaneous bile duct manipulation 8/18 History of cholangiocarcinoma with recent biliary drain placement  -IV Zosyn --> Augmentin -Blood cultures negative to date -GI signed off 8/25 -LFT initially improved, GI did not feel the stent in the left system is obstructed, possibly seeded from recent percutaneous bile duct manipulation -WBC improved -LFT with slight increase over the last day.  Check ammonia level with his disorientation overnight. -Discussed with Dr. Collene Mares over the phone, stable bilirubin level is ensuring.  No further recommendations.  Continue to trend CMP. -Consult IR today due to leaking of percutaneous biliary drain  Essential hypertension -Continue lisinopril  Mood disorder -Continue Wellbutrin, trazodone    DVT prophylaxis:  enoxaparin (LOVENOX) injection 40 mg Start: 07/28/21 2200  Code Status:     Code Status Orders  (From admission, onward)           Start     Ordered   07/28/21 1022  Full code  Continuous        07/28/21 1023           Code Status History     Date Active Date Inactive Code Status Order ID Comments User Context   07/10/2021 1730 07/13/2021 2121 Full Code [id number]  Jonnie Finner, DO ED   06/22/2021 2146 06/26/2021 2125 Full Code [id number]  Gwynne Edinger, MD ED   01/28/2020 1831 02/13/2020 1720 Full Code [id number]  Elizabeth Sauer Inpatient   01/28/2020 1831 01/28/2020 1831 Full Code [id number]  Elizabeth Sauer Inpatient   01/25/2020 0005 01/28/2020 1743 Full Code [id number]  Kerney Elbe, MD ED   01/18/2018 1921 01/19/2018 1937 Full Code [id number]  [name], MD Inpatient      Advance Directive Documentation    Flowsheet Row Most Recent Value  Type of Advance Directive Living will  Pre-existing out of  facility DNR order (yellow form or pink MOST form) --  "MOST" Form in Place? --      Family Communication: Discussed with wife over the phone  Disposition Plan:  Status is: Inpatient  Remains inpatient appropriate because:Unsafe d/c plan and Inpatient level of care appropriate due to severity of illness  Dispo: The patient is from: Home              Anticipated d/c is to: SNF              Patient currently is not medically stable to d/c.     Difficult to place patient No      Consultants:  GI   Antimicrobials:  Anti-infectives (From admission, onward)    Start     Dose/Rate Route Frequency Ordered Stop   07/31/21 1000  amoxicillin-clavulanate (AUGMENTIN) 875-125 MG per tablet 1 tablet        1 tablet Oral Every 12 hours 07/31/21 0855     07/28/21 1000  piperacillin-tazobactam (ZOSYN) IVPB 3.375 g  Status:  Discontinued        3.375 g 12.5 mL/hr over 240 Minutes Intravenous Every 8 hours 07/28/21 0846 07/31/21 0855   07/28/21 0045  vancomycin (VANCOCIN) IVPB 1000 mg/200 mL premix        1,000 mg 200 mL/hr over 60 Minutes Intravenous  Once 07/28/21 0038 07/28/21 0309   07/28/21 0045  piperacillin-tazobactam (ZOSYN) IVPB 3.375 g        3.375 g 100 mL/hr over 30 Minutes Intravenous  Once 07/28/21 0038 07/28/21 0238        Objective: Vitals:   07/31/21 0515 07/31/21 0810 07/31/21 2214 08/01/21 0457  BP: (!) 167/68 (!) 146/86 (!) 124/114 (!) 141/73  Pulse: 84 85 95 88  Resp: '18  18 14  '$ Temp: 97.9 F (36.6 C) (!) 97.5 F (36.4 C) 98.4 F (36.9 C) 98 F (36.7 C)  TempSrc: Oral Oral Oral Oral  SpO2: 97% 99% 99% 99%  Weight:    69.1 kg  Height:        Intake/Output Summary (Last 24 hours) at 08/01/2021 1343 Last data filed at 08/01/2021 1000 Gross per 24 hour  Intake --  Output 1600 ml  Net -1600 ml    Filed Weights   07/31/21 0500 08/01/21 0457  Weight: 67.9 kg 69.1 kg    Examination: General exam: Appears calm and comfortable  Respiratory system:  Clear to auscultation. Respiratory effort normal. Cardiovascular system: S1 & S2 heard, RRR. No pedal edema. Gastrointestinal system: Abdomen is nondistended, soft and nontender.  Central nervous system: Alert and oriented. Non focal exam. Speech clear  Extremities: Symmetric in appearance bilaterally  Skin: No rashes, lesions or ulcers on exposed skin  Psychiatry: Judgement and insight appear stable.   Data Reviewed: I have personally reviewed following labs and imaging studies  CBC: Recent Labs  Lab 07/27/21 2141 07/28/21 0849 07/29/21 0500 07/30/21 0514 07/31/21 0520 08/01/21 0524  WBC 16.3* 26.7* 27.7* 15.3* 12.6* 12.1*  NEUTROABS 14.7* 24.8* 23.3*  --   --   --  HGB 11.1* 11.8* 12.0* 9.6* 9.4* 9.8*  HCT 33.8* 36.7* 36.5* 29.0* 28.4* 29.6*  MCV 93.6 92.9 91.9 91.5 91.3 89.7  PLT 430* 453* 450* 322 345 419*    Basic Metabolic Panel: Recent Labs  Lab 07/28/21 0849 07/29/21 0500 07/30/21 0514 07/31/21 0520 08/01/21 0524  NA 134* 135 134* 135 130*  K 4.1 4.1 3.2* 4.0 3.6  CL 99 102 103 108 101  CO2 '26 23 23 22 '$ 21*  GLUCOSE 132* 100* 100* 110* 102*  BUN '12 21 17 14 12  '$ CREATININE 0.66 1.03 0.62 0.46* 0.52*  CALCIUM 9.2 8.7* 8.4* 8.5* 8.4*  MG 1.9  --   --   --   --     GFR: Estimated Creatinine Clearance: 82.8 mL/min (A) (by C-G formula based on SCr of 0.52 mg/dL (L)). Liver Function Tests: Recent Labs  Lab 07/28/21 0849 07/29/21 0500 07/30/21 0514 07/31/21 0520 08/01/21 0524  AST '30 25 24 '$ 47* 60*  ALT 53* 35 29 38 52*  ALKPHOS 811* 606* 453* 499* 589*  BILITOT 3.8* 3.3* 2.7* 2.2* 2.0*  PROT 7.0 6.2* 5.9* 5.8* 6.4*  ALBUMIN 3.0* 2.4* 2.2* 2.2* 2.4*    Recent Labs  Lab 07/27/21 2141  LIPASE 30    No results for input(s): AMMONIA in the last 168 hours. Coagulation Profile: No results for input(s): INR, PROTIME in the last 168 hours. Cardiac Enzymes: No results for input(s): CKTOTAL, CKMB, CKMBINDEX, TROPONINI in the last 168 hours. BNP (last 3  results) No results for input(s): PROBNP in the last 8760 hours. HbA1C: No results for input(s): HGBA1C in the last 72 hours. CBG: No results for input(s): GLUCAP in the last 168 hours. Lipid Profile: No results for input(s): CHOL, HDL, LDLCALC, TRIG, CHOLHDL, LDLDIRECT in the last 72 hours. Thyroid Function Tests: No results for input(s): TSH, T4TOTAL, FREET4, T3FREE, THYROIDAB in the last 72 hours. Anemia Panel: No results for input(s): VITAMINB12, FOLATE, FERRITIN, TIBC, IRON, RETICCTPCT in the last 72 hours. Sepsis Labs: Recent Labs  Lab 07/29/21 1601 07/29/21 1854  LATICACIDVEN 1.0 1.5     Recent Results (from the past 240 hour(s))  Blood culture (routine x 2)     Status: None (Preliminary result)   Collection Time: 07/28/21  1:49 AM   Specimen: BLOOD RIGHT HAND  Result Value Ref Range Status   Specimen Description   Final    BLOOD RIGHT HAND Performed at [facility], [place] [address], [place], [place] [address]    Special Requests   Final    BOTTLES DRAWN AEROBIC AND ANAEROBIC Blood Culture adequate volume Performed at [facility] [address], [place], [place] [address]    Culture   Final    NO GROWTH 4 DAYS Performed at [facility], [employer] [address] [place], [place] [address]    Report Status PENDING  Incomplete  Resp Panel by RT-PCR (Flu A&B, Covid) Nasopharyngeal Swab     Status: None   Collection Time: 07/28/21  1:51 AM   Specimen: Nasopharyngeal Swab; Nasopharyngeal(NP) swabs in vial transport medium  Result Value Ref Range Status   SARS Coronavirus 2 by RT PCR NEGATIVE NEGATIVE Final    Comment: (NOTE) SARS-CoV-2 target nucleic acids are NOT DETECTED.  The SARS-CoV-2 RNA is generally detectable in upper respiratory specimens during the acute phase of infection. The lowest concentration of SARS-CoV-2 viral copies this assay can detect is 138 copies/mL. A negative result does not preclude SARS-Cov-2 infection and should  not be used as the sole basis for treatment or other patient management decisions. A negative result may occur with  improper specimen collection/handling, submission of specimen other than nasopharyngeal swab, presence of viral mutation(s) within the areas targeted by this assay, and inadequate number of viral copies(<138 copies/mL). A negative result must be combined with clinical observations, patient history, and epidemiological information. The expected result is Negative.  Fact Sheet for Patients:  EntrepreneurPulse.com.au  Fact Sheet for Healthcare Providers:  IncredibleEmployment.be  This test is no t yet approved or cleared by the [place] FDA and  has been authorized for detection and/or diagnosis of SARS-CoV-2 by FDA under an Emergency Use Authorization (EUA). This EUA will remain  in effect (meaning this test can be used) for the duration of the COVID-19 declaration under Section 564(b)(1) of the Act, 21 U.S.C.section 360bbb-3(b)(1), unless the authorization is terminated  or revoked sooner.       Influenza A by PCR NEGATIVE NEGATIVE Final   Influenza B by PCR NEGATIVE NEGATIVE Final    Comment: (NOTE) The Xpert Xpress SARS-CoV-2/FLU/RSV plus assay is intended as an aid in the diagnosis of influenza from Nasopharyngeal swab specimens and should not be used as a sole basis for treatment. Nasal washings and aspirates are unacceptable for Xpert Xpress SARS-CoV-2/FLU/RSV testing.  Fact Sheet for Patients: EntrepreneurPulse.com.au  Fact Sheet for Healthcare Providers: IncredibleEmployment.be  This test is not yet approved or cleared by the [place] FDA and has been authorized for detection and/or diagnosis of SARS-CoV-2 by FDA under an Emergency Use Authorization (EUA). This EUA will remain in effect (meaning this test can be used) for the duration of the COVID-19 declaration under  Section 564(b)(1) of the Act, 21 U.S.C. section 360bbb-3(b)(1), unless the authorization is terminated or revoked.  Performed at [facility], [place] [address] [place], [place] [address]   Blood culture (routine x 2)     Status: None (Preliminary result)   Collection Time: 07/28/21  2:05 AM   Specimen: BLOOD  Result Value Ref Range Status   Specimen Description   Final    BLOOD BLOOD RIGHT FOREARM Performed at [facility] [address] [place], [place] [address]    Special Requests   Final    BOTTLES DRAWN AEROBIC AND ANAEROBIC Blood Culture adequate volume Performed at [facility] [address] [place], [place] [address]    Culture   Final    NO GROWTH 4 DAYS Performed at [facility], [place] [address], [place], [place] [address]    Report Status PENDING  Incomplete       Radiology Studies: No results found.    Scheduled Meds:  amoxicillin-clavulanate  1 tablet Oral Q12H   aspirin EC  325 mg Oral Daily   buPROPion  300 mg Oral Daily   Chlorhexidine Gluconate Cloth  6 each Topical Daily   docusate sodium  100 mg Oral BID   enoxaparin (LOVENOX) injection  40 mg Subcutaneous [id number]   folic acid  1 mg Oral Daily   lisinopril  20 mg Oral Daily   pantoprazole  40 mg Oral Daily   sodium chloride flush  3 mL Intravenous Q12H   traZODone  100 mg Oral QHS   Continuous Infusions:     LOS: 4 days      Time spent: 35 minutes   Dessa Phi, DO [facility] 08/01/2021, 1:43 PM   Available via Epic secure chat 7am-7pm After these  hours, please refer to coverage provider listed on [email or web address]

## 2021-08-02 LAB — COMPREHENSIVE METABOLIC PANEL
ALT: 66 U/L — ABNORMAL HIGH (ref 0–44)
AST: 70 U/L — ABNORMAL HIGH (ref 15–41)
Albumin: 2.3 g/dL — ABNORMAL LOW (ref 3.5–5.0)
Alkaline Phosphatase: 521 U/L — ABNORMAL HIGH (ref 38–126)
Anion gap: 8 (ref 5–15)
BUN: 14 mg/dL (ref 8–23)
CO2: 23 mmol/L (ref 22–32)
Calcium: 8.7 mg/dL — ABNORMAL LOW (ref 8.9–10.3)
Chloride: 108 mmol/L (ref 98–111)
Creatinine, Ser: 0.52 mg/dL — ABNORMAL LOW (ref 0.61–1.24)
GFR, Estimated: >60 mL/min (ref >60)
Glucose, Bld: 124 mg/dL — ABNORMAL HIGH (ref 70–99)
Potassium: 3.7 mmol/L (ref 3.5–5.1)
Sodium: 139 mmol/L (ref 135–145)
Total Bilirubin: 1.9 mg/dL — ABNORMAL HIGH (ref 0.3–1.2)
Total Protein: 6.3 g/dL — ABNORMAL LOW (ref 6.5–8.1)

## 2021-08-02 LAB — CULTURE, BLOOD (ROUTINE X 2)
Culture: NO GROWTH
Culture: NO GROWTH
Special Requests: ADEQUATE
Special Requests: ADEQUATE

## 2021-08-02 LAB — CBC
HCT: 29.2 % — ABNORMAL LOW (ref 39.0–52.0)
Hemoglobin: 9.4 g/dL — ABNORMAL LOW (ref 13.0–17.0)
MCH: 29.4 pg (ref 26.0–34.0)
MCHC: 32.2 g/dL (ref 30.0–36.0)
MCV: 91.3 fL (ref 80.0–100.0)
Platelets: 429 K/uL — ABNORMAL HIGH (ref 150–400)
RBC: 3.2 MIL/uL — ABNORMAL LOW (ref 4.22–5.81)
RDW: 15.3 % (ref 11.5–15.5)
WBC: 13.8 K/uL — ABNORMAL HIGH (ref 4.0–10.5)
nRBC: 0 % (ref 0.0–0.2)

## 2021-08-02 MED ORDER — PIPERACILLIN-TAZOBACTAM 3.375 G IVPB
3.3750 g | Freq: Three times a day (TID) | INTRAVENOUS | Status: DC
  Administered 2021-08-02 – 2021-08-03 (×5): 3.375 g via INTRAVENOUS
  Filled 2021-08-02 (×8): qty 50

## 2021-08-02 NOTE — Progress Notes (Addendum)
IR team asked to eval biliary drain. Placed at [facility] on 8/18, then capped on 8/19 Pt was given instructions if leaking at drain site to reconnect to gravity bag  and contact his team at [facility], but he does not remember these instructions. Has now been admitted to [facility] and drain noted to have leakage around drain from skin site.  Drain intact. Bilious drainage from skin site. Flushes easily with return of bilious fluid.  Connected to new gravity bag.  Expect leakage at skin to stop now that connected to gravity bag. Flush bid with saline to ensure proper drain function. Drain care orders placed  Follow up with primary IR/GI team at [facility] as planned.  Call this IR team if any further drain concerns during this admission  Ascencion Dike PA-C Interventional Radiology 08/02/2021 3:06 PM

## 2021-08-02 NOTE — TOC Progression Note (Signed)
Transition of Care [facility]) - Progression Note    Patient Details  Name: Ruben Chavez MRN: [id number] Date of Birth: 1951/11/24  Transition of Care [facility]) CM/SW Contact  Ziaire Hagos, Marjie Skiff, RN Phone Number: 08/02/2021, 12:30 PM  Clinical Narrative:     Josem Kaufmann for SNF received from [facility] [phone]. Auth for ambulance transport received as well [id number]. Auth for ambulance good for 5 days. No SNF bed offers yet. TOC will follow for SNF bed offers. [place] will need updated on which SNF bed chosen.  Expected Discharge Plan: Skilled Nursing Facility Barriers to Discharge: Continued Medical Work up  Expected Discharge Plan and Services Expected Discharge Plan: [place]   Discharge Planning Services: CM Consult     Expected Discharge Date:  (unknown)                  Social Determinants of Health (SDOH) Interventions    Readmission Risk Interventions Readmission Risk Prevention Plan 08/01/2021  Transportation Screening Complete  PCP or Specialist Appt within 3-5 Days Complete  HRI or Home Care Consult Complete  Social Work Consult for [place] Planning/Counseling Complete  Palliative Care Screening Not Applicable  Medication Review Press photographer) Complete  Some recent data might be hidden

## 2021-08-02 NOTE — Progress Notes (Signed)
PROGRESS NOTE    KHIZAR SMITHER  [id number] DOB: 31-Jan-1951 DOA: 07/27/2021 PCP: Isaac Bliss, Rayford Halsted, MD     Brief Narrative:  Ruben Chavez is a 70 y.o. male with medical history significant of hypertension, recent diagnosis of cholangiocarcinoma and biliary obstruction status post biliary tube placement at [facility] on 07/23/2021 presented to ED with a complaint of right upper quadrant abdominal pain.  Patient was doing fine up until 4 days since his drain was placed.  He started having pain suddenly in the right upper quadrant, which was dull and severe and radiating to the right shoulder.  Per wife, he had subjective fever but temperature was 99, he had diaphoresis as well.  Did not have any other associated symptoms such as nausea, vomiting, constipation, diarrhea. CT abdomen pelvis with contrast showed stable internal biliary stent extending into segment 2 of the liver, slight interval increase in intrahepatic biliary duct dilatation within segment 2 suggesting possible obstruction of the catheter.  However the right-sided internal and external catheter looked okay.  Had elevated LFTs, alkaline phosphatase and bilirubin however compared to just 2 weeks ago, they were slightly better. ED physician discussed case with Dr. Melven Sartorius of oncology at [place], and based on his recommendations, will need IV abx and admission but no beds at this time.  Placed on transfer list.   New events last 24 hours / Subjective: Patient no longer experiencing "twilight zone" confusion that he did yesterday.  He is on bedpan, trying to have his first bowel movement in a week.  Has no abdominal pain.  Assessment & Plan:   Active Problems:   Essential hypertension   Bile duct obstruction   Cholangitis   Concern for cholangitis, either stent obstruction with intrahepatic biliary duct dilatation versus recent percutaneous bile duct manipulation 8/18 History of cholangiocarcinoma with recent  biliary drain placement  -IV Zosyn --> Augmentin --> back on IV Zosyn due to increasing WBC and LFT -Blood cultures negative to date -GI signed off 8/25 -LFT initially improved, GI did not feel the stent in the left system is obstructed, possibly seeded from recent percutaneous bile duct manipulation -LFT continue to increase, although bilirubin levels remain improved -Discussed with Dr. Collene Mares over the phone on 8/27, stable bilirubin level is ensuring.  No further recommendations.  Continue to trend CMP. -Consulted IR due to leaking of percutaneous biliary drain -Ordered lactulose  Essential hypertension -Continue lisinopril  Mood disorder -Continue Wellbutrin, trazodone    DVT prophylaxis:  enoxaparin (LOVENOX) injection 40 mg Start: 07/28/21 2200  Code Status:     Code Status Orders  (From admission, onward)           Start     Ordered   07/28/21 1022  Full code  Continuous        07/28/21 1023           Code Status History     Date Active Date Inactive Code Status Order ID Comments User Context   07/10/2021 1730 07/13/2021 2121 Full Code [id number]  Jonnie Finner, DO ED   06/22/2021 2146 06/26/2021 2125 Full Code [id number]  Gwynne Edinger, MD ED   01/28/2020 1831 02/13/2020 1720 Full Code [id number]  Cathlyn Parsons, PA-C Inpatient   01/28/2020 1831 01/28/2020 1831 Full Code [id number]  Cathlyn Parsons, PA-C Inpatient   01/25/2020 0005 01/28/2020 1743 Full Code [id number]  Kerney Elbe, MD ED   01/18/2018 1921 01/19/2018 1937 Full Code [id number]  [name], MD  Inpatient      Advance Directive Documentation    [place] Most Recent Value  Type of Advance Directive Living will  Pre-existing out of facility DNR order (yellow form or pink MOST form) --  "MOST" Form in Place? --      Family Communication: Discussed with wife over the phone 8/27 Disposition Plan:  Status is: Inpatient  Remains inpatient appropriate because:Unsafe d/c plan, IV treatments  appropriate due to intensity of illness or inability to take PO, and Inpatient level of care appropriate due to severity of illness  Dispo: The patient is from: Home              Anticipated d/c is to: SNF              Patient currently is not medically stable to d/c.     Difficult to place patient No      Consultants:  GI IR   Antimicrobials:  Anti-infectives (From admission, onward)    Start     Dose/Rate Route Frequency Ordered Stop   08/02/21 0815  piperacillin-tazobactam (ZOSYN) IVPB 3.375 g        3.375 g 12.5 mL/hr over 240 Minutes Intravenous Every 8 hours 08/02/21 0725     07/31/21 1000  amoxicillin-clavulanate (AUGMENTIN) 875-125 MG per tablet 1 tablet  Status:  Discontinued        1 tablet Oral Every 12 hours 07/31/21 0855 08/02/21 0725   07/28/21 1000  piperacillin-tazobactam (ZOSYN) IVPB 3.375 g  Status:  Discontinued        3.375 g 12.5 mL/hr over 240 Minutes Intravenous Every 8 hours 07/28/21 0846 07/31/21 0855   07/28/21 0045  vancomycin (VANCOCIN) IVPB 1000 mg/200 mL premix        1,000 mg 200 mL/hr over 60 Minutes Intravenous  Once 07/28/21 0038 07/28/21 0309   07/28/21 0045  piperacillin-tazobactam (ZOSYN) IVPB 3.375 g        3.375 g 100 mL/hr over 30 Minutes Intravenous  Once 07/28/21 0038 07/28/21 0238        Objective: Vitals:   08/01/21 1352 08/01/21 2141 08/02/21 0500 08/02/21 1127  BP: (!) 146/71 (!) 146/77 138/66 (!) 143/74  Pulse: 83 (!) 102 85   Resp: '17 20 20   '$ Temp: 98.2 F (36.8 C) 98.5 F (36.9 C) 98.5 F (36.9 C)   TempSrc: Oral Oral Oral   SpO2: 100% 99% 99%   Weight:   66.4 kg   Height:        Intake/Output Summary (Last 24 hours) at 08/02/2021 1341 Last data filed at 08/02/2021 1126 Gross per 24 hour  Intake 893 ml  Output 1300 ml  Net -407 ml    Filed Weights   07/31/21 0500 08/01/21 0457 08/02/21 0500  Weight: 67.9 kg 69.1 kg 66.4 kg    Examination: General exam: Appears calm and comfortable, weak  appearing Respiratory system: Clear to auscultation. Respiratory effort normal. Cardiovascular system: S1 & S2 heard, RRR. No pedal edema. Gastrointestinal system: Abdomen is nondistended, soft and nontender. Normal bowel sounds heard. + Right-sided biliary drain in place Central nervous system: Alert and oriented. Non focal exam. Speech clear  Extremities: Symmetric in appearance bilaterally  Skin: No rashes, lesions or ulcers on exposed skin  Psychiatry: Judgement and insight appear stable. Mood & affect appropriate.    Data Reviewed: I have personally reviewed following labs and imaging studies  CBC: Recent Labs  Lab 07/27/21 2141 07/28/21 0849 07/29/21 0500 07/30/21 0514 07/31/21 0520 08/01/21  [id number] 08/02/21 0420  WBC 16.3* 26.7* 27.7* 15.3* 12.6* 12.1* 13.8*  NEUTROABS 14.7* 24.8* 23.3*  --   --   --   --   HGB 11.1* 11.8* 12.0* 9.6* 9.4* 9.8* 9.4*  HCT 33.8* 36.7* 36.5* 29.0* 28.4* 29.6* 29.2*  MCV 93.6 92.9 91.9 91.5 91.3 89.7 91.3  PLT 430* 453* 450* 322 345 419* 429*    Basic Metabolic Panel: Recent Labs  Lab 07/28/21 0849 07/29/21 0500 07/30/21 0514 07/31/21 0520 08/01/21 0524 08/02/21 0420  NA 134* 135 134* 135 130* 139  K 4.1 4.1 3.2* 4.0 3.6 3.7  CL 99 102 103 108 101 108  CO2 '26 23 23 22 '$ 21* 23  GLUCOSE 132* 100* 100* 110* 102* 124*  BUN '12 21 17 14 12 14  '$ CREATININE 0.66 1.03 0.62 0.46* 0.52* 0.52*  CALCIUM 9.2 8.7* 8.4* 8.5* 8.4* 8.7*  MG 1.9  --   --   --   --   --     GFR: Estimated Creatinine Clearance: 79.5 mL/min (A) (by C-G formula based on SCr of 0.52 mg/dL (L)). Liver Function Tests: Recent Labs  Lab 07/29/21 0500 07/30/21 0514 07/31/21 0520 08/01/21 0524 08/02/21 0420  AST 25 24 47* 60* 70*  ALT 35 29 38 52* 66*  ALKPHOS 606* 453* 499* 589* 521*  BILITOT 3.3* 2.7* 2.2* 2.0* 1.9*  PROT 6.2* 5.9* 5.8* 6.4* 6.3*  ALBUMIN 2.4* 2.2* 2.2* 2.4* 2.3*    Recent Labs  Lab 07/27/21 2141  LIPASE 30    Recent Labs  Lab  08/01/21 1440  AMMONIA 81*   Coagulation Profile: No results for input(s): INR, PROTIME in the last 168 hours. Cardiac Enzymes: No results for input(s): CKTOTAL, CKMB, CKMBINDEX, TROPONINI in the last 168 hours. BNP (last 3 results) No results for input(s): PROBNP in the last 8760 hours. HbA1C: No results for input(s): HGBA1C in the last 72 hours. CBG: No results for input(s): GLUCAP in the last 168 hours. Lipid Profile: No results for input(s): CHOL, HDL, LDLCALC, TRIG, CHOLHDL, LDLDIRECT in the last 72 hours. Thyroid Function Tests: No results for input(s): TSH, T4TOTAL, FREET4, T3FREE, THYROIDAB in the last 72 hours. Anemia Panel: No results for input(s): VITAMINB12, FOLATE, FERRITIN, TIBC, IRON, RETICCTPCT in the last 72 hours. Sepsis Labs: Recent Labs  Lab 07/29/21 1601 07/29/21 1854  LATICACIDVEN 1.0 1.5     Recent Results (from the past 240 hour(s))  Blood culture (routine x 2)     Status: None (Preliminary result)   Collection Time: 07/28/21  1:49 AM   Specimen: BLOOD RIGHT HAND  Result Value Ref Range Status   Specimen Description   Final    BLOOD RIGHT HAND Performed at [facility], [place] [address], [place], [place] [address]    Special Requests   Final    BOTTLES DRAWN AEROBIC AND ANAEROBIC Blood Culture adequate volume Performed at [facility] [address] [place], [place] [address]    Culture   Final    NO GROWTH 4 DAYS Performed at [facility], [place] [address], [place], [place] [address]    Report Status PENDING  Incomplete  Resp Panel by RT-PCR (Flu A&B, Covid) Nasopharyngeal Swab     Status: None   Collection Time: 07/28/21  1:51 AM   Specimen: Nasopharyngeal Swab; Nasopharyngeal(NP) swabs in vial transport medium  Result Value Ref Range Status   SARS Coronavirus 2 by RT PCR NEGATIVE NEGATIVE Final    Comment: (NOTE)  SARS-CoV-2 target nucleic acids are NOT DETECTED.  The SARS-CoV-2 RNA is generally detectable  in upper respiratory specimens during the acute phase of infection. The lowest concentration of SARS-CoV-2 viral copies this assay can detect is 138 copies/mL. A negative result does not preclude SARS-Cov-2 infection and should not be used as the sole basis for treatment or other patient management decisions. A negative result may occur with  improper specimen collection/handling, submission of specimen other than nasopharyngeal swab, presence of viral mutation(s) within the areas targeted by this assay, and inadequate number of viral copies(<138 copies/mL). A negative result must be combined with clinical observations, patient history, and epidemiological information. The expected result is Negative.  Fact Sheet for Patients:  EntrepreneurPulse.com.au  Fact Sheet for Healthcare Providers:  IncredibleEmployment.be  This test is no t yet approved or cleared by the [place] FDA and  has been authorized for detection and/or diagnosis of SARS-CoV-2 by FDA under an Emergency Use Authorization (EUA). This EUA will remain  in effect (meaning this test can be used) for the duration of the COVID-19 declaration under Section 564(b)(1) of the Act, 21 U.S.C.section 360bbb-3(b)(1), unless the authorization is terminated  or revoked sooner.       Influenza A by PCR NEGATIVE NEGATIVE Final   Influenza B by PCR NEGATIVE NEGATIVE Final    Comment: (NOTE) The Xpert Xpress SARS-CoV-2/FLU/RSV plus assay is intended as an aid in the diagnosis of influenza from Nasopharyngeal swab specimens and should not be used as a sole basis for treatment. Nasal washings and aspirates are unacceptable for Xpert Xpress SARS-CoV-2/FLU/RSV testing.  Fact Sheet for Patients: EntrepreneurPulse.com.au  Fact Sheet for Healthcare Providers: IncredibleEmployment.be  This test is not yet approved or cleared by the [place] FDA and has  been authorized for detection and/or diagnosis of SARS-CoV-2 by FDA under an Emergency Use Authorization (EUA). This EUA will remain in effect (meaning this test can be used) for the duration of the COVID-19 declaration under Section 564(b)(1) of the Act, 21 U.S.C. section 360bbb-3(b)(1), unless the authorization is terminated or revoked.  Performed at [facility], [place] [address] [place], [place] [address]   Blood culture (routine x 2)     Status: None (Preliminary result)   Collection Time: 07/28/21  2:05 AM   Specimen: BLOOD  Result Value Ref Range Status   Specimen Description   Final    BLOOD BLOOD RIGHT FOREARM Performed at [facility] [address] [place], [place] [address]    Special Requests   Final    BOTTLES DRAWN AEROBIC AND ANAEROBIC Blood Culture adequate volume Performed at [facility] [address] [place], [place] [address]    Culture   Final    NO GROWTH 4 DAYS Performed at [facility], [place] [address] [place], [place] [address]    Report Status PENDING  Incomplete       Radiology Studies: No results found.    Scheduled Meds:  aspirin EC  325 mg Oral Daily   buPROPion  300 mg Oral Daily   Chlorhexidine Gluconate Cloth  6 each Topical Daily   docusate sodium  100 mg Oral BID   enoxaparin (LOVENOX) injection  40 mg Subcutaneous [id number]   folic acid  1 mg Oral Daily   lactulose  20 g Oral BID   lisinopril  20 mg Oral Daily   pantoprazole  40 mg Oral Daily   sodium chloride flush  3 mL Intravenous  Q12H   traZODone  100 mg Oral QHS   Continuous Infusions:  piperacillin-tazobactam (ZOSYN)  IV 3.375 g (08/02/21 0922)      LOS: 5 days      Time spent: 25 minutes   Dessa Phi, DO [facility] 08/02/2021, 1:41 PM   Available via Epic secure chat 7am-7pm After these hours, please refer to coverage provider listed on [email or web address]

## 2021-08-02 NOTE — Plan of Care (Signed)

## 2021-08-03 ENCOUNTER — Ambulatory Visit (INDEPENDENT_AMBULATORY_CARE_PROVIDER_SITE_OTHER): Payer: PPO

## 2021-08-03 DIAGNOSIS — Z66 Do not resuscitate: Secondary | ICD-10-CM | POA: Diagnosis not present

## 2021-08-03 DIAGNOSIS — C240 Malignant neoplasm of extrahepatic bile duct: Secondary | ICD-10-CM | POA: Diagnosis not present

## 2021-08-03 DIAGNOSIS — K831 Obstruction of bile duct: Secondary | ICD-10-CM | POA: Diagnosis not present

## 2021-08-03 DIAGNOSIS — I639 Cerebral infarction, unspecified: Secondary | ICD-10-CM | POA: Diagnosis not present

## 2021-08-03 DIAGNOSIS — K8309 Other cholangitis: Secondary | ICD-10-CM | POA: Diagnosis not present

## 2021-08-03 DIAGNOSIS — I63411 Cerebral infarction due to embolism of right middle cerebral artery: Secondary | ICD-10-CM

## 2021-08-03 DIAGNOSIS — R7401 Elevation of levels of liver transaminase levels: Secondary | ICD-10-CM | POA: Diagnosis not present

## 2021-08-03 DIAGNOSIS — D72819 Decreased white blood cell count, unspecified: Secondary | ICD-10-CM | POA: Diagnosis not present

## 2021-08-03 DIAGNOSIS — Z9689 Presence of other specified functional implants: Secondary | ICD-10-CM | POA: Diagnosis not present

## 2021-08-03 DIAGNOSIS — T85638A Leakage of other specified internal prosthetic devices, implants and grafts, initial encounter: Secondary | ICD-10-CM | POA: Diagnosis not present

## 2021-08-03 DIAGNOSIS — C221 Intrahepatic bile duct carcinoma: Secondary | ICD-10-CM | POA: Diagnosis not present

## 2021-08-03 DIAGNOSIS — K8301 Primary sclerosing cholangitis: Secondary | ICD-10-CM | POA: Diagnosis not present

## 2021-08-03 DIAGNOSIS — F329 Major depressive disorder, single episode, unspecified: Secondary | ICD-10-CM | POA: Diagnosis not present

## 2021-08-03 DIAGNOSIS — I69334 Monoplegia of upper limb following cerebral infarction affecting left non-dominant side: Secondary | ICD-10-CM | POA: Diagnosis not present

## 2021-08-03 DIAGNOSIS — Z4682 Encounter for fitting and adjustment of non-vascular catheter: Secondary | ICD-10-CM | POA: Diagnosis not present

## 2021-08-03 DIAGNOSIS — F331 Major depressive disorder, recurrent, moderate: Secondary | ICD-10-CM | POA: Diagnosis not present

## 2021-08-03 DIAGNOSIS — R7989 Other specified abnormal findings of blood chemistry: Secondary | ICD-10-CM | POA: Diagnosis not present

## 2021-08-03 DIAGNOSIS — R21 Rash and other nonspecific skin eruption: Secondary | ICD-10-CM | POA: Diagnosis not present

## 2021-08-03 DIAGNOSIS — I10 Essential (primary) hypertension: Secondary | ICD-10-CM | POA: Diagnosis not present

## 2021-08-03 DIAGNOSIS — Z87891 Personal history of nicotine dependence: Secondary | ICD-10-CM | POA: Diagnosis not present

## 2021-08-03 DIAGNOSIS — R590 Localized enlarged lymph nodes: Secondary | ICD-10-CM | POA: Diagnosis not present

## 2021-08-03 DIAGNOSIS — R451 Restlessness and agitation: Secondary | ICD-10-CM | POA: Diagnosis not present

## 2021-08-03 DIAGNOSIS — R9431 Abnormal electrocardiogram [ECG] [EKG]: Secondary | ICD-10-CM | POA: Diagnosis not present

## 2021-08-03 DIAGNOSIS — D72829 Elevated white blood cell count, unspecified: Secondary | ICD-10-CM | POA: Diagnosis not present

## 2021-08-03 DIAGNOSIS — Z96643 Presence of artificial hip joint, bilateral: Secondary | ICD-10-CM | POA: Diagnosis not present

## 2021-08-03 DIAGNOSIS — K838 Other specified diseases of biliary tract: Secondary | ICD-10-CM | POA: Diagnosis not present

## 2021-08-03 DIAGNOSIS — L304 Erythema intertrigo: Secondary | ICD-10-CM | POA: Diagnosis not present

## 2021-08-03 DIAGNOSIS — Z8546 Personal history of malignant neoplasm of prostate: Secondary | ICD-10-CM | POA: Diagnosis not present

## 2021-08-03 DIAGNOSIS — Z8673 Personal history of transient ischemic attack (TIA), and cerebral infarction without residual deficits: Secondary | ICD-10-CM | POA: Diagnosis not present

## 2021-08-03 DIAGNOSIS — K5900 Constipation, unspecified: Secondary | ICD-10-CM | POA: Diagnosis not present

## 2021-08-03 LAB — COMPREHENSIVE METABOLIC PANEL
ALT: 62 U/L — ABNORMAL HIGH (ref 0–44)
AST: 52 U/L — ABNORMAL HIGH (ref 15–41)
Albumin: 2.3 g/dL — ABNORMAL LOW (ref 3.5–5.0)
Alkaline Phosphatase: 487 U/L — ABNORMAL HIGH (ref 38–126)
Anion gap: 8 (ref 5–15)
BUN: 16 mg/dL (ref 8–23)
CO2: 23 mmol/L (ref 22–32)
Calcium: 8.5 mg/dL — ABNORMAL LOW (ref 8.9–10.3)
Chloride: 104 mmol/L (ref 98–111)
Creatinine, Ser: 0.59 mg/dL — ABNORMAL LOW (ref 0.61–1.24)
GFR, Estimated: >60 mL/min (ref >60)
Glucose, Bld: 100 mg/dL — ABNORMAL HIGH (ref 70–99)
Potassium: 3.6 mmol/L (ref 3.5–5.1)
Sodium: 135 mmol/L (ref 135–145)
Total Bilirubin: 1.8 mg/dL — ABNORMAL HIGH (ref 0.3–1.2)
Total Protein: 6 g/dL — ABNORMAL LOW (ref 6.5–8.1)

## 2021-08-03 LAB — CBC
HCT: 27.7 % — ABNORMAL LOW (ref 39.0–52.0)
Hemoglobin: 9 g/dL — ABNORMAL LOW (ref 13.0–17.0)
MCH: 29.9 pg (ref 26.0–34.0)
MCHC: 32.5 g/dL (ref 30.0–36.0)
MCV: 92 fL (ref 80.0–100.0)
Platelets: 422 10*3/uL — ABNORMAL HIGH (ref 150–400)
RBC: 3.01 MIL/uL — ABNORMAL LOW (ref 4.22–5.81)
RDW: 15.3 % (ref 11.5–15.5)
WBC: 13.9 10*3/uL — ABNORMAL HIGH (ref 4.0–10.5)
nRBC: 0 % (ref 0.0–0.2)

## 2021-08-03 NOTE — Progress Notes (Signed)
[facility] picked patient up for transfer to [facility]. Belongings sent with patient, paperwork given to transport, report given to the accepting RN, Dr. Olena Heckle verbally ordered patient to be transferred, patient given Tylenol and Trazodone prior to pick up. Will call patient wife regarding the transfer. Patient VSS.

## 2021-08-03 NOTE — Progress Notes (Signed)
Physical Therapy Treatment Patient Details Name: Ruben Chavez MRN: [id number] DOB: Aug 09, 1951 Today's Date: 08/03/2021    History of Present Illness Pt is a 70 y.o. male who presented to ED with a complaint of right upper quadrant abdominal pain. Past medical history significant of hypertension, recent diagnosis of cholangiocarcinoma and biliary obstruction status post biliary tube placement at [facility] on 07/23/2021.    PT Comments    Patient progressing to hallway ambulation this session with hand hold assist.  Still weak and unstable with cane.  NR max 110.  Feel he will continue to progress with skilled PT in the acute setting and follow up SNF level rehab at d/c.    Follow Up Recommendations  SNF     Equipment Recommendations  None recommended by PT    Recommendations for Other Services       Precautions / Restrictions Precautions Precautions: Fall Precaution Comments: Left sided hemiparesis, UE (Stroke ~Feb 2021) Required Braces or Orthoses: Other Brace Other Brace: L resting hand splint    Mobility  Bed Mobility               General bed mobility comments: up in chair    Transfers Overall transfer level: Needs assistance Equipment used: Rolling walker (2 wheeled) Transfers: Sit to/from Stand Sit to Stand: Mod assist;Min assist         General transfer comment: up from recliner x1 min to mod A but noted biliary drain tube around chair armrest once pt c/o pulling so cued to sit back down and replaced with RN aware.  Second stand again min to mod A pt using counting and momentum  Ambulation/Gait Ambulation/Gait assistance: Mod assist;Min assist Gait Distance (Feet): 80 Feet Assistive device: 1 person hand held assist (interchanged with wall rail) Gait Pattern/deviations: Step-to pattern;Decreased stride length;Trunk flexed;Shuffle     General Gait Details: initially up to RW from sitting, but once standing removed walker and offerred HHA, then once  in hallway pt using rail on wall, then HHA to move to opposite side of hall   Stairs             Wheelchair Mobility    Modified Rankin (Stroke Patients Only)       Balance Overall balance assessment: Needs assistance Sitting-balance support: Feet supported Sitting balance-Leahy Scale: Fair Sitting balance - Comments: in recliner leaning L at times, can sit edge of chair with S   Standing balance support: Single extremity supported Standing balance-Leahy Scale: Poor Standing balance comment: UE support for balance                            Cognition Arousal/Alertness: Awake/alert Behavior During Therapy: WFL for tasks assessed/performed Overall Cognitive Status: No family/caregiver present to determine baseline cognitive functioning Area of Impairment: Memory;Problem solving;Following commands                     Memory: Decreased short-term memory Following Commands: Follows multi-step commands with increased time     Problem Solving: Slow processing;Requires verbal cues        Exercises      General Comments General comments (skin integrity, edema, etc.): took long time to finish eating breakfast, then RN in to deliver medications, RN aware to check on drain position      Pertinent Vitals/Pain Pain Assessment: No/denies pain    Home Living  Prior Function            PT Goals (current goals can now be found in the care plan section) Progress towards PT goals: Progressing toward goals    Frequency    Min 3X/week      PT Plan Current plan remains appropriate    Co-evaluation              AM-PAC PT "6 Clicks" Mobility   Outcome Measure  Help needed turning from your back to your side while in a flat bed without using bedrails?: A Little Help needed moving from lying on your back to sitting on the side of a flat bed without using bedrails?: A Lot Help needed moving to and from a bed to  a chair (including a wheelchair)?: A Lot Help needed standing up from a chair using your arms (e.g., wheelchair or bedside chair)?: A Lot Help needed to walk in hospital room?: A Lot Help needed climbing 3-5 steps with a railing? : A Lot 6 Click Score: 13    End of Session Equipment Utilized During Treatment: Gait belt Activity Tolerance: Patient tolerated treatment well Patient left: in chair;with call bell/phone within reach;with chair alarm set   PT Visit Diagnosis: Unsteadiness on feet (R26.81);Muscle weakness (generalized) (M62.81);Other abnormalities of gait and mobility (R26.89)     Time: [id number] PT Time Calculation (min) (ACUTE ONLY): 28 min  Charges:  $Gait Training: 8-22 mins $Therapeutic Activity: 8-22 mins                     Ruben Chavez, PT Acute Rehabilitation Services Pager:[phone] Office:[phone] 08/03/2021    Reginia Naas 08/03/2021, 2:13 PM

## 2021-08-03 NOTE — Progress Notes (Signed)
PROGRESS NOTE    Ruben Chavez  [id number] DOB: 1951/08/10 DOA: 07/27/2021 PCP: Isaac Bliss, Rayford Halsted, MD     Brief Narrative:  Ruben Chavez is a 70 y.o. male with medical history significant of hypertension, recent diagnosis of cholangiocarcinoma and biliary obstruction status post biliary tube placement at [facility] on 07/23/2021 presented to ED with a complaint of right upper quadrant abdominal pain.  Patient was doing fine up until 4 days since his drain was placed.  He started having pain suddenly in the right upper quadrant, which was dull and severe and radiating to the right shoulder.  Per wife, he had subjective fever but temperature was 99, he had diaphoresis as well.  Did not have any other associated symptoms such as nausea, vomiting, constipation, diarrhea. CT abdomen pelvis with contrast showed stable internal biliary stent extending into segment 2 of the liver, slight interval increase in intrahepatic biliary duct dilatation within segment 2 suggesting possible obstruction of the catheter.  However the right-sided internal and external catheter looked okay.  Had elevated LFTs, alkaline phosphatase and bilirubin however compared to just 2 weeks ago, they were slightly better. ED physician discussed case with Dr. Melven Sartorius of oncology at [place], and based on his recommendations, will need IV abx and admission but no beds at this time.  Placed on transfer list.   New events last 24 hours / Subjective: Patient had 2 bowel movements yesterday.  Continues to be very weak, needs help getting set up for breakfast.  No physical complaints this morning.  Assessment & Plan:   Active Problems:   Essential hypertension   Bile duct obstruction   Cholangitis   Concern for cholangitis, either stent obstruction with intrahepatic biliary duct dilatation versus recent percutaneous bile duct manipulation 8/18 History of cholangiocarcinoma with recent biliary drain placement  -IV  Zosyn --> Augmentin --> back on IV Zosyn due to increasing WBC and LFT -Blood cultures negative to date -GI signed off 8/25 -LFT initially improved, GI did not feel the stent in the left system is obstructed, possibly seeded from recent percutaneous bile duct manipulation -LFT continue to increase, although bilirubin levels remain improved -Discussed with Dr. Collene Mares over the phone on 8/27, stable bilirubin level is ensuring.  No further recommendations.  Continue to trend CMP. -Consulted IR due to leaking of percutaneous biliary drain.  Drain was connected to gravity bag. -Continue lactulose -LFT and bilirubin continues to improve.  Essential hypertension -Continue lisinopril  Mood disorder -Continue Wellbutrin, trazodone    DVT prophylaxis:  enoxaparin (LOVENOX) injection 40 mg Start: 07/28/21 2200  Code Status:     Code Status Orders  (From admission, onward)           Start     Ordered   07/28/21 1022  Full code  Continuous        07/28/21 1023           Code Status History     Date Active Date Inactive Code Status Order ID Comments User Context   07/10/2021 1730 07/13/2021 2121 Full Code [id number]  Jonnie Finner, DO ED   06/22/2021 2146 06/26/2021 2125 Full Code [id number]  Gwynne Edinger, MD ED   01/28/2020 1831 02/13/2020 1720 Full Code [id number]  Cathlyn Parsons, PA-C Inpatient   01/28/2020 1831 01/28/2020 1831 Full Code [id number]  Cathlyn Parsons, PA-C Inpatient   01/25/2020 0005 01/28/2020 1743 Full Code [id number]  Kerney Elbe, MD ED   01/18/2018 1921 01/19/2018 1937  Full Code [id number]  [name], MD Inpatient      Advance Directive Documentation    Flowsheet Row Most Recent Value  Type of Advance Directive Living will  Pre-existing out of facility DNR order (yellow form or pink MOST form) --  "MOST" Form in Place? --      Family Communication: Discussed with wife over the phone today Disposition Plan:  Status is: Inpatient  Remains  inpatient appropriate because:Unsafe d/c plan, IV treatments appropriate due to intensity of illness or inability to take PO, and Inpatient level of care appropriate due to severity of illness  Dispo: The patient is from: Home              Anticipated d/c is to: SNF              Patient currently is not medically stable to d/c.     Difficult to place patient No      Consultants:  GI IR   Antimicrobials:  Anti-infectives (From admission, onward)    Start     Dose/Rate Route Frequency Ordered Stop   08/02/21 0815  piperacillin-tazobactam (ZOSYN) IVPB 3.375 g        3.375 g 12.5 mL/hr over 240 Minutes Intravenous Every 8 hours 08/02/21 0725     07/31/21 1000  amoxicillin-clavulanate (AUGMENTIN) 875-125 MG per tablet 1 tablet  Status:  Discontinued        1 tablet Oral Every 12 hours 07/31/21 0855 08/02/21 0725   07/28/21 1000  piperacillin-tazobactam (ZOSYN) IVPB 3.375 g  Status:  Discontinued        3.375 g 12.5 mL/hr over 240 Minutes Intravenous Every 8 hours 07/28/21 0846 07/31/21 0855   07/28/21 0045  vancomycin (VANCOCIN) IVPB 1000 mg/200 mL premix        1,000 mg 200 mL/hr over 60 Minutes Intravenous  Once 07/28/21 0038 07/28/21 0309   07/28/21 0045  piperacillin-tazobactam (ZOSYN) IVPB 3.375 g        3.375 g 100 mL/hr over 30 Minutes Intravenous  Once 07/28/21 0038 07/28/21 0238        Objective: Vitals:   08/02/21 2113 08/03/21 0423 08/03/21 0455 08/03/21 1227  BP: 133/67 125/61  (!) 142/77  Pulse: 96 83    Resp: 20 18    Temp: 99 F (37.2 C) 97.6 F (36.4 C)    TempSrc: Oral Oral    SpO2: 99% 98%    Weight:   68.1 kg   Height:        Intake/Output Summary (Last 24 hours) at 08/03/2021 1332 Last data filed at 08/03/2021 1234 Gross per 24 hour  Intake 953 ml  Output 1160 ml  Net -207 ml    Filed Weights   08/01/21 0457 08/02/21 0500 08/03/21 0455  Weight: 69.1 kg 66.4 kg 68.1 kg    Examination: General exam: Appears calm and comfortable,  frail Respiratory system: Clear to auscultation. Respiratory effort normal. Cardiovascular system: S1 & S2 heard, RRR. No pedal edema. Gastrointestinal system: Abdomen is nondistended, soft and nontender. Normal bowel sounds heard. + Biliary drain in place with gravity bag Central nervous system: Alert and oriented. Non focal exam. Speech clear  Extremities: Symmetric in appearance bilaterally  Skin: No rashes, lesions or ulcers on exposed skin  Psychiatry: Judgement and insight appear stable. Mood & affect appropriate.     Data Reviewed: I have personally reviewed following labs and imaging studies  CBC: Recent Labs  Lab 07/27/21 2141 07/28/21 0849 07/29/21 0500 07/30/21 [id number]  07/31/21 0520 08/01/21 0524 08/02/21 0420 08/03/21 0419  WBC 16.3* 26.7* 27.7* 15.3* 12.6* 12.1* 13.8* 13.9*  NEUTROABS 14.7* 24.8* 23.3*  --   --   --   --   --   HGB 11.1* 11.8* 12.0* 9.6* 9.4* 9.8* 9.4* 9.0*  HCT 33.8* 36.7* 36.5* 29.0* 28.4* 29.6* 29.2* 27.7*  MCV 93.6 92.9 91.9 91.5 91.3 89.7 91.3 92.0  PLT 430* 453* 450* 322 345 419* 429* 422*    Basic Metabolic Panel: Recent Labs  Lab 07/28/21 0849 07/29/21 0500 07/30/21 0514 07/31/21 0520 08/01/21 0524 08/02/21 0420 08/03/21 0419  NA 134*   < > 134* 135 130* 139 135  K 4.1   < > 3.2* 4.0 3.6 3.7 3.6  CL 99   < > 103 108 101 108 104  CO2 26   < > 23 22 21* 23 23  GLUCOSE 132*   < > 100* 110* 102* 124* 100*  BUN 12   < > '17 14 12 14 16  '$ CREATININE 0.66   < > 0.62 0.46* 0.52* 0.52* 0.59*  CALCIUM 9.2   < > 8.4* 8.5* 8.4* 8.7* 8.5*  MG 1.9  --   --   --   --   --   --    < > = values in this interval not displayed.    GFR: Estimated Creatinine Clearance: 81.6 mL/min (A) (by C-G formula based on SCr of 0.59 mg/dL (L)). Liver Function Tests: Recent Labs  Lab 07/30/21 0514 07/31/21 0520 08/01/21 0524 08/02/21 0420 08/03/21 0419  AST 24 47* 60* 70* 52*  ALT 29 38 52* 66* 62*  ALKPHOS 453* 499* 589* 521* 487*  BILITOT 2.7* 2.2*  2.0* 1.9* 1.8*  PROT 5.9* 5.8* 6.4* 6.3* 6.0*  ALBUMIN 2.2* 2.2* 2.4* 2.3* 2.3*    Recent Labs  Lab 07/27/21 2141  LIPASE 30    Recent Labs  Lab 08/01/21 1440  AMMONIA 81*    Coagulation Profile: No results for input(s): INR, PROTIME in the last 168 hours. Cardiac Enzymes: No results for input(s): CKTOTAL, CKMB, CKMBINDEX, TROPONINI in the last 168 hours. BNP (last 3 results) No results for input(s): PROBNP in the last 8760 hours. HbA1C: No results for input(s): HGBA1C in the last 72 hours. CBG: No results for input(s): GLUCAP in the last 168 hours. Lipid Profile: No results for input(s): CHOL, HDL, LDLCALC, TRIG, CHOLHDL, LDLDIRECT in the last 72 hours. Thyroid Function Tests: No results for input(s): TSH, T4TOTAL, FREET4, T3FREE, THYROIDAB in the last 72 hours. Anemia Panel: No results for input(s): VITAMINB12, FOLATE, FERRITIN, TIBC, IRON, RETICCTPCT in the last 72 hours. Sepsis Labs: Recent Labs  Lab 07/29/21 1601 07/29/21 1854  LATICACIDVEN 1.0 1.5     Recent Results (from the past 240 hour(s))  Blood culture (routine x 2)     Status: None   Collection Time: 07/28/21  1:49 AM   Specimen: BLOOD RIGHT HAND  Result Value Ref Range Status   Specimen Description   Final    BLOOD RIGHT HAND Performed at [facility], [place] [address] [place], [place] [address]    Special Requests   Final    BOTTLES DRAWN AEROBIC AND ANAEROBIC Blood Culture adequate volume Performed at [place] [address], [place], [place] [address]    Culture   Final    NO GROWTH 5 DAYS Performed at [facility], [place] [address] [place], [place], [place] [address]    Report Status  08/02/2021 FINAL  Final  Resp Panel by RT-PCR (Flu A&B, Covid) Nasopharyngeal Swab     Status: None   Collection Time: 07/28/21  1:51 AM   Specimen: Nasopharyngeal Swab; Nasopharyngeal(NP) swabs in vial transport medium  Result Value Ref Range Status   SARS Coronavirus 2 by RT PCR  NEGATIVE NEGATIVE Final    Comment: (NOTE) SARS-CoV-2 target nucleic acids are NOT DETECTED.  The SARS-CoV-2 RNA is generally detectable in upper respiratory specimens during the acute phase of infection. The lowest concentration of SARS-CoV-2 viral copies this assay can detect is 138 copies/mL. A negative result does not preclude SARS-Cov-2 infection and should not be used as the sole basis for treatment or other patient management decisions. A negative result may occur with  improper specimen collection/handling, submission of specimen other than nasopharyngeal swab, presence of viral mutation(s) within the areas targeted by this assay, and inadequate number of viral copies(<138 copies/mL). A negative result must be combined with clinical observations, patient history, and epidemiological information. The expected result is Negative.  Fact Sheet for Patients:  EntrepreneurPulse.com.au  Fact Sheet for Healthcare Providers:  IncredibleEmployment.be  This test is no t yet approved or cleared by the [place] FDA and  has been authorized for detection and/or diagnosis of SARS-CoV-2 by FDA under an Emergency Use Authorization (EUA). This EUA will remain  in effect (meaning this test can be used) for the duration of the COVID-19 declaration under Section 564(b)(1) of the Act, 21 U.S.C.section 360bbb-3(b)(1), unless the authorization is terminated  or revoked sooner.       Influenza A by PCR NEGATIVE NEGATIVE Final   Influenza B by PCR NEGATIVE NEGATIVE Final    Comment: (NOTE) The Xpert Xpress SARS-CoV-2/FLU/RSV plus assay is intended as an aid in the diagnosis of influenza from Nasopharyngeal swab specimens and should not be used as a sole basis for treatment. Nasal washings and aspirates are unacceptable for Xpert Xpress SARS-CoV-2/FLU/RSV testing.  Fact Sheet for Patients: EntrepreneurPulse.com.au  Fact Sheet for  Healthcare Providers: IncredibleEmployment.be  This test is not yet approved or cleared by the [place] FDA and has been authorized for detection and/or diagnosis of SARS-CoV-2 by FDA under an Emergency Use Authorization (EUA). This EUA will remain in effect (meaning this test can be used) for the duration of the COVID-19 declaration under Section 564(b)(1) of the Act, 21 U.S.C. section 360bbb-3(b)(1), unless the authorization is terminated or revoked.  Performed at [facility], [place] [address], [place], [place] [address]   Blood culture (routine x 2)     Status: None   Collection Time: 07/28/21  2:05 AM   Specimen: BLOOD  Result Value Ref Range Status   Specimen Description   Final    BLOOD BLOOD RIGHT FOREARM Performed at [place] [address] [place], [place] [address]    Special Requests   Final    BOTTLES DRAWN AEROBIC AND ANAEROBIC Blood Culture adequate volume Performed at [place] [address], [place], [place] [address]    Culture   Final    NO GROWTH 5 DAYS Performed at [facility], [place] [address], [place],  [address]    Report Status 08/02/2021 FINAL  Final       Radiology Studies: No results found.    Scheduled Meds:  aspirin EC  325 mg Oral Daily   buPROPion  300 mg Oral Daily   Chlorhexidine Gluconate Cloth  6 each Topical Daily  docusate sodium  100 mg Oral BID   enoxaparin (LOVENOX) injection  40 mg Subcutaneous [id number]   folic acid  1 mg Oral Daily   lactulose  20 g Oral BID   lisinopril  20 mg Oral Daily   pantoprazole  40 mg Oral Daily   sodium chloride flush  3 mL Intravenous Q12H   traZODone  100 mg Oral QHS   Continuous Infusions:  piperacillin-tazobactam (ZOSYN)  IV 3.375 g (08/03/21 0502)      LOS: 6 days      Time spent: 25 minutes   Dessa Phi, DO [facility] 08/03/2021, 1:32 PM   Available via Epic secure  chat 7am-7pm After these hours, please refer to coverage provider listed on [email or web address]

## 2021-08-03 NOTE — Plan of Care (Signed)

## 2021-08-04 ENCOUNTER — Other Ambulatory Visit: Payer: Self-pay | Admitting: Internal Medicine

## 2021-08-04 DIAGNOSIS — K8301 Primary sclerosing cholangitis: Secondary | ICD-10-CM | POA: Diagnosis not present

## 2021-08-04 DIAGNOSIS — L304 Erythema intertrigo: Secondary | ICD-10-CM | POA: Diagnosis not present

## 2021-08-04 DIAGNOSIS — R9431 Abnormal electrocardiogram [ECG] [EKG]: Secondary | ICD-10-CM | POA: Diagnosis not present

## 2021-08-04 DIAGNOSIS — K5900 Constipation, unspecified: Secondary | ICD-10-CM | POA: Diagnosis not present

## 2021-08-04 DIAGNOSIS — D72819 Decreased white blood cell count, unspecified: Secondary | ICD-10-CM | POA: Diagnosis not present

## 2021-08-04 DIAGNOSIS — F329 Major depressive disorder, single episode, unspecified: Secondary | ICD-10-CM | POA: Diagnosis not present

## 2021-08-04 DIAGNOSIS — I639 Cerebral infarction, unspecified: Secondary | ICD-10-CM | POA: Diagnosis not present

## 2021-08-04 DIAGNOSIS — I10 Essential (primary) hypertension: Secondary | ICD-10-CM | POA: Diagnosis not present

## 2021-08-04 DIAGNOSIS — R7401 Elevation of levels of liver transaminase levels: Secondary | ICD-10-CM | POA: Diagnosis not present

## 2021-08-04 DIAGNOSIS — C240 Malignant neoplasm of extrahepatic bile duct: Secondary | ICD-10-CM | POA: Diagnosis not present

## 2021-08-04 DIAGNOSIS — Z9689 Presence of other specified functional implants: Secondary | ICD-10-CM | POA: Diagnosis not present

## 2021-08-04 DIAGNOSIS — F339 Major depressive disorder, recurrent, unspecified: Secondary | ICD-10-CM

## 2021-08-04 LAB — CUP PACEART REMOTE DEVICE CHECK
Date Time Interrogation Session: 20220821230634
Implantable Pulse Generator Implant Date: 20210222

## 2021-08-04 NOTE — Discharge Summary (Signed)
Physician Discharge Summary  DUANNE YALE [id number] DOB: 04-21-1951 DOA: 07/27/2021  PCP: Isaac Bliss, Rayford Halsted, MD  Admit date: 07/27/2021 Discharge date: 08/04/2021  Admitted From: Home Disposition:  Transferred to [facility]  Discharge Condition: Stable CODE STATUS: Full  Diet recommendation: Regular   Brief/Interim Summary: Ruben Chavez is a 70 y.o. male with medical history significant of hypertension, recent diagnosis of cholangiocarcinoma and biliary obstruction status post biliary tube placement at [facility] on 07/23/2021 presented to ED with a complaint of right upper quadrant abdominal pain.  Patient was doing fine up until 4 days since his drain was placed.  He started having pain suddenly in the right upper quadrant, which was dull and severe and radiating to the right shoulder.  Per wife, he had subjective fever but temperature was 99, he had diaphoresis as well.  Did not have any other associated symptoms such as nausea, vomiting, constipation, diarrhea. CT abdomen pelvis with contrast showed stable internal biliary stent extending into segment 2 of the liver, slight interval increase in intrahepatic biliary duct dilatation within segment 2 suggesting possible obstruction of the catheter.  However the right-sided internal and external catheter looked okay.  Had elevated LFTs, alkaline phosphatase and bilirubin however compared to just 2 weeks ago, they were slightly better. ED physician discussed case with Dr. Melven Sartorius of oncology at [facility], and based on his recommendations, will need IV abx and admission but no beds at this time.  Placed on transfer list.   During patient's hospitalization, GI was consulted as well as IR. Patient was treated with IV zosyn for concern for cholangitis. Due to leaking of biliary stent, IR saw patient and connected his drain to a gravity bag. His LFT had initial improvement, then worsened. On 8/29, patient was transferred to [facility] for further care.   Discharge Diagnoses:  Active Problems:   Essential hypertension   Bile duct obstruction   Cholangitis  Concern for cholangitis, either stent obstruction with intrahepatic biliary duct dilatation versus recent percutaneous bile duct manipulation 8/18 History of cholangiocarcinoma with recent biliary drain placement  -IV Zosyn --> Augmentin --> back on IV Zosyn due to increasing WBC and LFT -Blood cultures negative to date -LFT initially improved, GI did not feel the stent in the left system is obstructed, possibly seeded from recent percutaneous bile duct manipulation. GI signed off.  -LFT continue to increase, although bilirubin levels remain improved -Discussed with Dr. Collene Mares (GI) over the phone on 8/27, stable bilirubin level is ensuring.  No further recommendations.  Continue to trend CMP. -Consulted IR due to leaking of percutaneous biliary drain.  Drain was connected to gravity bag. -Continue lactulose -LFT and bilirubin improved overnight  Essential hypertension -Continue lisinopril   Mood disorder -Continue Wellbutrin, trazodone   Discharge Instructions   Allergies as of 08/03/2021       Reactions   Sulfa Antibiotics Swelling, Other (See Comments)   Swelling in ankles Swelling in ankles   Advil [ibuprofen] Other (See Comments)   "Dots on chest" (Petechiae)   Aleve [naproxen] Other (See Comments)   "Dots on chest" (Petechiae)   Betadine [povidone Iodine] Itching, Rash   Chloroxylenol (antiseptic) Itching, Rash, Other (See Comments)   PCMX surgical sterilizing scrub   Poison Ivy Extract Rash   Povidone-iodine Itching, Rash   BETADINE        Medication List     STOP taking these medications    aspirin EC 325 MG tablet   Baclofen  5 MG Tabs   buPROPion 300 MG 24 hr tablet Commonly known as: WELLBUTRIN XL   folic acid 1 MG tablet Commonly known as: FOLVITE   lactulose 10 GM/15ML solution Commonly known as: CHRONULAC    lisinopril-hydrochlorothiazide 20-25 MG tablet Commonly known as: ZESTORETIC   Magnesium 250 MG Tabs   multivitamin with minerals Tabs tablet   ondansetron 8 MG tablet Commonly known as: ZOFRAN   pantoprazole 40 MG tablet Commonly known as: PROTONIX   prochlorperazine 10 MG tablet Commonly known as: COMPAZINE   traZODone 100 MG tablet Commonly known as: DESYREL        Allergies  Allergen Reactions   Sulfa Antibiotics Swelling and Other (See Comments)    Swelling in ankles  Swelling in ankles   Advil [Ibuprofen] Other (See Comments)    "Dots on chest" (Petechiae)   Aleve [Naproxen] Other (See Comments)    "Dots on chest" (Petechiae)   Betadine [Povidone Iodine] Itching and Rash   Chloroxylenol (Antiseptic) Itching, Rash and Other (See Comments)    PCMX surgical sterilizing scrub   Poison Ivy Extract Rash   Povidone-Iodine Itching and Rash    BETADINE    Consultations: GI IR    Procedures/Studies: DG Shoulder Right  Result Date: 07/27/2021 CLINICAL DATA:  Shoulder pain EXAM: RIGHT SHOULDER - 2+ VIEW COMPARISON:  None. FINDINGS: No acute bony abnormality. Specifically, no fracture, subluxation, or dislocation. Joint spaces maintained. Soft tissues are intact. IMPRESSION: No acute bony abnormality. Electronically Signed   By: Rolm Baptise M.D.   On: 07/27/2021 23:51   CT Abdomen Pelvis W Contrast  Result Date: 07/27/2021 CLINICAL DATA:  Abdominal pain, acute, nonlocalized Pain at cholecystostomy tube site EXAM: CT ABDOMEN AND PELVIS WITH CONTRAST TECHNIQUE: Multidetector CT imaging of the abdomen and pelvis was performed using the standard protocol following bolus administration of intravenous contrast. CONTRAST:  2m OMNIPAQUE IOHEXOL 350 MG/ML SOLN COMPARISON:  07/10/2021, MRI 06/23/2021 FINDINGS: Lower chest: Bibasilar atelectasis is present. Central venous catheter tip noted within the a right atrium. Global cardiac size within normal limits. Hepatobiliary:  Internal biliary stent is again seen extending from the common duct within the pancreatic head into the a segment 2 biliary tree. There has been interval placement of a right internal/external biliary drainage catheter. There is mild infiltration of the perihepatic tissue adjacent to the a catheter exit site, however, there is no discrete drainable fluid collection identified. Right-sided intrahepatic biliary ductal dilation has improved. Mild left-sided biliary ductal dilation has developed within segment 2. Cyst within segment 4 of the liver is unchanged. The gallbladder is mildly distended, but is otherwise unremarkable. Pancreas: Unremarkable Spleen: Unremarkable Adrenals/Urinary Tract: The adrenal glands are unremarkable. The kidneys are normal in size and position. Simple cortical cyst noted within the lower pole the right kidney. 3 mm nonobstructing calculus noted within the lower pole of the left kidney. The bladder is largely obscured by streak artifact from bilateral total hip arthroplasty. Stomach/Bowel: Stomach and visualized small bowel are unremarkable. Appendix normal. Moderate stool noted within the ascending and transverse colon without evidence of obstruction. The large bowel is otherwise unremarkable. No free intraperitoneal gas or fluid. Vascular/Lymphatic: Extensive aortoiliac atherosclerotic calcification. No aortic aneurysm. No pathologic adenopathy within the abdomen and pelvis. Reproductive: Brachytherapy seeds noted within the prostate gland. Other: Probable bilateral inguinal hernia repair with mesh. Rectum unremarkable. Musculoskeletal: Bilateral total hip arthroplasty has been performed. Degenerative changes are seen within the lumbar spine. No acute bone abnormality. IMPRESSION: Interval right-sided internal/external biliary  drainage catheter placement. Catheter appears propria Lea position with interval decrease in right intrahepatic biliary ductal dilation. Mild perihepatic soft  tissue infiltration adjacent to the catheter exit site is noted, possibly postprocedural in nature, however, no discrete drainable fluid collection is identified. Stable internal biliary stent extending into segment 2 of the liver. Slight interval increase in intrahepatic biliary ductal dilation within segment 2 suggesting possible obstruction of the catheter. Moderate stool throughout the colon without evidence of obstruction. Minimal left nonobstructing nephrolithiasis Aortic Atherosclerosis (ICD10-I70.0). Electronically Signed   By: Fidela Salisbury M.D.   On: 07/27/2021 23:54   CT ABDOMEN PELVIS W CONTRAST  Result Date: 07/10/2021 CLINICAL DATA:  Abdominal abscess/infection. Altered mental status. Biliary tumor, likely cholangiocarcinoma. Jaundice. EXAM: CT ABDOMEN AND PELVIS WITH CONTRAST TECHNIQUE: Multidetector CT imaging of the abdomen and pelvis was performed using the standard protocol following bolus administration of intravenous contrast. CONTRAST:  74m OMNIPAQUE IOHEXOL 350 MG/ML SOLN COMPARISON:  06/11/2021 and MRI from 06/23/2021 FINDINGS: Lower chest: Mild dependent atelectasis in both lower lobes. Descending thoracic aortic atherosclerotic calcification. Hepatobiliary: There has been placement of a biliary stent which extends into the left bile duct. Interval decompression of the left biliary tree although there is continued moderate biliary dilatation in the right hepatic lobe. Indistinct tissue planes along the confluence of the right and left hepatic lobes suspicious for Klatskin tumor. Contracted gallbladder containing high density material compatible with contrast medium. 1.1 by 0.9 cm cyst in segment 2 of the liver. Nonspecific 0.6 cm hypodense lesion in segment 4a. Probable 1.2 by 0.8 cm cyst in segment 4b of the liver on image 28 series 2, versus a dilated biliary element. Nonspecific 0.3 cm hypodense lesion posteriorly in the right hepatic lobe on image 31 series 2. Nonspecific hypodense  peripheral lesion in the right hepatic lobe measuring 1.3 by 1.0 cm on image 18 series 2, although previously characterized as a hemangioma. Pancreas: Unremarkable Spleen: Unremarkable Adrenals/Urinary Tract: 5.1 by 4.3 cm Bosniak category 1 cyst of the right kidney lower pole. Distal ureters in urinary bladder obscured by streak artifact from the patient's hip implants. 0.6 cm stone or cluster of stones in the left kidney lower pole on image 69 series 4. Adrenal glands unremarkable. Stomach/Bowel: Prominent stool throughout the colon favors constipation. Vascular/Lymphatic: Aortoiliac atherosclerotic vascular disease. Reproductive: Brachytherapy seeds in the prostate gland, otherwise obscured by streak artifact from the hip implants. Hypodense lesion in the right scrotum measuring up to 2.9 cm in diameter, possibly an epididymal cyst or spermatocele but not fully characterized on today's exam. Other: No intra-abdominal abscess is identified. Musculoskeletal: Hernia mesh markers along the lower anterior abdominal wall. Bilateral total hip prostheses, a 1.3 by 0.9 cm lucency along the upper margin of the right acetabular shell component is nonspecific but could conceivably reflect early particulate disease. Slightly exaggerated posterior angulation of the left acetabular shell component. Old healed left posterior rib fractures. Multilevel lumbar spondylosis and degenerative disc disease with resulting lumbar impingement. IMPRESSION: 1. Interval placement of a biliary stent extending from the duodenum into the left biliary system. There has been decompression of the left biliary system but the right hepatic lobe biliary system is still moderately dilated. 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 2. Several small hypodense lesions in the liver are probably cysts or similar benign lesions. One of these has been previously characterized as a hemangioma. Surveillance suggested. 3. No compelling findings of distant  metastatic spread. 4.  Prominent stool throughout the colon favors  constipation. 5. Other imaging findings of potential clinical significance: Minimal atelectasis in both lower lobes. Aortic Atherosclerosis (ICD10-I70.0). Nonobstructive left nephrolithiasis. Brachytherapy seeds in the prostate gland. Possible right epididymal cyst or spermatocele. Lumbar spondylosis and degenerative disc disease causing multilevel impingement. Electronically Signed   By: Van Clines M.D.   On: 07/10/2021 12:48   DG Chest Port 1 View  Result Date: 07/27/2021 CLINICAL DATA:  Leukocytosis EXAM: PORTABLE CHEST 1 VIEW COMPARISON:  07/10/2021 FINDINGS: Right Port-A-Cath is in place with the tip in the SVC. Heart is normal size. No confluent airspace opacities or effusions. No acute bony abnormality. IMPRESSION: No active disease. Electronically Signed   By: Rolm Baptise M.D.   On: 07/27/2021 23:51   DG Chest Port 1 View  Result Date: 07/10/2021 CLINICAL DATA:  Questionable sepsis EXAM: PORTABLE CHEST 1 VIEW COMPARISON:  03/18/2016 FINDINGS: Normal heart size and mediastinal contours. There is no edema, consolidation, effusion, or pneumothorax. Subcutaneous implantable cardiac device. IMPRESSION: No evidence of active disease. Electronically Signed   By: Monte Fantasia M.D.   On: 07/10/2021 09:39       Discharge Exam: Vitals:   08/03/21 2121 08/03/21 2131  BP: 121/66 121/66  Pulse: 86 86  Resp: 18 16  Temp: 97.9 F (36.6 C) 97.9 F (36.6 C)  SpO2: 98% 98%    General exam: Appears calm and comfortable, frail Respiratory system: Clear to auscultation. Respiratory effort normal. Cardiovascular system: S1 & S2 heard, RRR. No pedal edema. Gastrointestinal system: Abdomen is nondistended, soft and nontender. Normal bowel sounds heard. + Biliary drain in place with gravity bag Central nervous system: Alert and oriented. Non focal exam. Speech clear  Extremities: Symmetric in appearance bilaterally  Skin: No  rashes, lesions or ulcers on exposed skin  Psychiatry: Judgement and insight appear stable. Mood & affect appropriate.     The results of significant diagnostics from this hospitalization (including imaging, microbiology, ancillary and laboratory) are listed below for reference.     Microbiology: Recent Results (from the past 240 hour(s))  Blood culture (routine x 2)     Status: None   Collection Time: 07/28/21  1:49 AM   Specimen: BLOOD RIGHT HAND  Result Value Ref Range Status   Specimen Description   Final    BLOOD RIGHT HAND Performed at [facility], [address], [place], [place] [address]    Special Requests   Final    BOTTLES DRAWN AEROBIC AND ANAEROBIC Blood Culture adequate volume Performed at [facility] [address], [place], [place] [address]    Culture   Final    NO GROWTH 5 DAYS Performed at [facility], [place] [address], [place], [place] [address]    Report Status 08/02/2021 FINAL  Final  Resp Panel by RT-PCR (Flu A&B, Covid) Nasopharyngeal Swab     Status: None   Collection Time: 07/28/21  1:51 AM   Specimen: Nasopharyngeal Swab; Nasopharyngeal(NP) swabs in vial transport medium  Result Value Ref Range Status   SARS Coronavirus 2 by RT PCR NEGATIVE NEGATIVE Final    Comment: (NOTE) SARS-CoV-2 target nucleic acids are NOT DETECTED.  The SARS-CoV-2 RNA is generally detectable in upper respiratory specimens during the acute phase of infection. The lowest concentration of SARS-CoV-2 viral copies this assay can detect is 138 copies/mL. A negative result does not preclude SARS-Cov-2 infection and should not be used as the sole basis for treatment or other patient management decisions. A negative result may occur  with  improper specimen collection/handling, submission of specimen other than nasopharyngeal swab, presence of viral mutation(s) within the areas targeted by this assay, and inadequate number of viral copies(<138  copies/mL). A negative result must be combined with clinical observations, patient history, and epidemiological information. The expected result is Negative.  Fact Sheet for Patients:  EntrepreneurPulse.com.au  Fact Sheet for Healthcare Providers:  IncredibleEmployment.be  This test is no t yet approved or cleared by the [place] FDA and  has been authorized for detection and/or diagnosis of SARS-CoV-2 by FDA under an Emergency Use Authorization (EUA). This EUA will remain  in effect (meaning this test can be used) for the duration of the COVID-19 declaration under Section 564(b)(1) of the Act, 21 U.S.C.section 360bbb-3(b)(1), unless the authorization is terminated  or revoked sooner.       Influenza A by PCR NEGATIVE NEGATIVE Final   Influenza B by PCR NEGATIVE NEGATIVE Final    Comment: (NOTE) The Xpert Xpress SARS-CoV-2/FLU/RSV plus assay is intended as an aid in the diagnosis of influenza from Nasopharyngeal swab specimens and should not be used as a sole basis for treatment. Nasal washings and aspirates are unacceptable for Xpert Xpress SARS-CoV-2/FLU/RSV testing.  Fact Sheet for Patients: EntrepreneurPulse.com.au  Fact Sheet for Healthcare Providers: IncredibleEmployment.be  This test is not yet approved or cleared by the [place] FDA and has been authorized for detection and/or diagnosis of SARS-CoV-2 by FDA under an Emergency Use Authorization (EUA). This EUA will remain in effect (meaning this test can be used) for the duration of the COVID-19 declaration under Section 564(b)(1) of the Act, 21 U.S.C. section 360bbb-3(b)(1), unless the authorization is terminated or revoked.  Performed at [facility], [place] [address] [place], [place] [address]   Blood culture (routine x 2)     Status: None   Collection Time: 07/28/21  2:05 AM   Specimen: BLOOD  Result Value Ref  Range Status   Specimen Description   Final    BLOOD BLOOD RIGHT FOREARM Performed at [facility] [address], [place], [place] [address]    Special Requests   Final    BOTTLES DRAWN AEROBIC AND ANAEROBIC Blood Culture adequate volume Performed at [facility] [address], [place], [place] [address]    Culture   Final    NO GROWTH 5 DAYS Performed at [facility], [place] [address], [place], [place] [address]    Report Status 08/02/2021 FINAL  Final     Labs: BNP (last 3 results) No results for input(s): BNP in the last 8760 hours. Basic Metabolic Panel: Recent Labs  Lab 07/28/21 0849 07/29/21 0500 07/30/21 0514 07/31/21 0520 08/01/21 0524 08/02/21 0420 08/03/21 0419  NA 134*   < > 134* 135 130* 139 135  K 4.1   < > 3.2* 4.0 3.6 3.7 3.6  CL 99   < > 103 108 101 108 104  CO2 26   < > 23 22 21* 23 23  GLUCOSE 132*   < > 100* 110* 102* 124* 100*  BUN 12   < > '17 14 12 14 16  '$ CREATININE 0.66   < > 0.62 0.46* 0.52* 0.52* 0.59*  CALCIUM 9.2   < > 8.4* 8.5* 8.4* 8.7* 8.5*  MG 1.9  --   --   --   --   --   --    < > = values in this interval not displayed.   Liver  Function Tests: Recent Labs  Lab 07/30/21 0514 07/31/21 0520 08/01/21 0524 08/02/21 0420 08/03/21 0419  AST 24 47* 60* 70* 52*  ALT 29 38 52* 66* 62*  ALKPHOS 453* 499* 589* 521* 487*  BILITOT 2.7* 2.2* 2.0* 1.9* 1.8*  PROT 5.9* 5.8* 6.4* 6.3* 6.0*  ALBUMIN 2.2* 2.2* 2.4* 2.3* 2.3*   No results for input(s): LIPASE, AMYLASE in the last 168 hours. Recent Labs  Lab 08/01/21 1440  AMMONIA 81*   CBC: Recent Labs  Lab 07/28/21 0849 07/29/21 0500 07/30/21 0514 07/31/21 0520 08/01/21 0524 08/02/21 0420 08/03/21 0419  WBC 26.7* 27.7* 15.3* 12.6* 12.1* 13.8* 13.9*  NEUTROABS 24.8* 23.3*  --   --   --   --   --   HGB 11.8* 12.0* 9.6* 9.4* 9.8* 9.4* 9.0*  HCT 36.7* 36.5* 29.0* 28.4* 29.6* 29.2* 27.7*  MCV 92.9 91.9 91.5 91.3 89.7 91.3 92.0  PLT 453*  450* 322 345 419* 429* 422*   Cardiac Enzymes: No results for input(s): CKTOTAL, CKMB, CKMBINDEX, TROPONINI in the last 168 hours. BNP: Invalid input(s): POCBNP CBG: No results for input(s): GLUCAP in the last 168 hours. D-Dimer No results for input(s): DDIMER in the last 72 hours. Hgb A1c No results for input(s): HGBA1C in the last 72 hours. Lipid Profile No results for input(s): CHOL, HDL, LDLCALC, TRIG, CHOLHDL, LDLDIRECT in the last 72 hours. Thyroid function studies No results for input(s): TSH, T4TOTAL, T3FREE, THYROIDAB in the last 72 hours.  Invalid input(s): FREET3 Anemia work up No results for input(s): VITAMINB12, FOLATE, FERRITIN, TIBC, IRON, RETICCTPCT in the last 72 hours. Urinalysis    Component Value Date/Time   COLORURINE AMBER (A) 07/27/2021 2141   APPEARANCEUR CLEAR 07/27/2021 2141   LABSPEC 1.028 07/27/2021 2141   PHURINE 5.0 07/27/2021 2141   GLUCOSEU NEGATIVE 07/27/2021 2141   HGBUR NEGATIVE 07/27/2021 2141   HGBUR negative 03/13/2010 1145   BILIRUBINUR NEGATIVE 07/27/2021 2141   BILIRUBINUR 1+ 11/13/2019 1341   KETONESUR NEGATIVE 07/27/2021 2141   PROTEINUR 30 (A) 07/27/2021 2141   UROBILINOGEN 0.2 11/13/2019 1341   UROBILINOGEN 0.2 03/13/2010 1145   NITRITE NEGATIVE 07/27/2021 2141   LEUKOCYTESUR NEGATIVE 07/27/2021 2141   Sepsis Labs Invalid input(s): PROCALCITONIN,  WBC,  LACTICIDVEN Microbiology Recent Results (from the past 240 hour(s))  Blood culture (routine x 2)     Status: None   Collection Time: 07/28/21  1:49 AM   Specimen: BLOOD RIGHT HAND  Result Value Ref Range Status   Specimen Description   Final    BLOOD RIGHT HAND Performed at [facility], [place] [address], [place], [place] [address]    Special Requests   Final    BOTTLES DRAWN AEROBIC AND ANAEROBIC Blood Culture adequate volume Performed at [facility] [address] [place], [place] [address]    Culture   Final    NO GROWTH 5 DAYS Performed  at [facility], [place] [address] [place], [place] [address]    Report Status 08/02/2021 FINAL  Final  Resp Panel by RT-PCR (Flu A&B, Covid) Nasopharyngeal Swab     Status: None   Collection Time: 07/28/21  1:51 AM   Specimen: Nasopharyngeal Swab; Nasopharyngeal(NP) swabs in vial transport medium  Result Value Ref Range Status   SARS Coronavirus 2 by RT PCR NEGATIVE NEGATIVE Final    Comment: (NOTE) SARS-CoV-2 target nucleic acids are NOT DETECTED.  The SARS-CoV-2 RNA is generally detectable in upper respiratory specimens during the acute  phase of infection. The lowest concentration of SARS-CoV-2 viral copies this assay can detect is 138 copies/mL. A negative result does not preclude SARS-Cov-2 infection and should not be used as the sole basis for treatment or other patient management decisions. A negative result may occur with  improper specimen collection/handling, submission of specimen other than nasopharyngeal swab, presence of viral mutation(s) within the areas targeted by this assay, and inadequate number of viral copies(<138 copies/mL). A negative result must be combined with clinical observations, patient history, and epidemiological information. The expected result is Negative.  Fact Sheet for Patients:  EntrepreneurPulse.com.au  Fact Sheet for Healthcare Providers:  IncredibleEmployment.be  This test is no t yet approved or cleared by the [place] FDA and  has been authorized for detection and/or diagnosis of SARS-CoV-2 by FDA under an Emergency Use Authorization (EUA). This EUA will remain  in effect (meaning this test can be used) for the duration of the COVID-19 declaration under Section 564(b)(1) of the Act, 21 U.S.C.section 360bbb-3(b)(1), unless the authorization is terminated  or revoked sooner.       Influenza A by PCR NEGATIVE NEGATIVE Final   Influenza B by PCR NEGATIVE NEGATIVE Final    Comment: (NOTE) The  Xpert Xpress SARS-CoV-2/FLU/RSV plus assay is intended as an aid in the diagnosis of influenza from Nasopharyngeal swab specimens and should not be used as a sole basis for treatment. Nasal washings and aspirates are unacceptable for Xpert Xpress SARS-CoV-2/FLU/RSV testing.  Fact Sheet for Patients: EntrepreneurPulse.com.au  Fact Sheet for Healthcare Providers: IncredibleEmployment.be  This test is not yet approved or cleared by the [place] FDA and has been authorized for detection and/or diagnosis of SARS-CoV-2 by FDA under an Emergency Use Authorization (EUA). This EUA will remain in effect (meaning this test can be used) for the duration of the COVID-19 declaration under Section 564(b)(1) of the Act, 21 U.S.C. section 360bbb-3(b)(1), unless the authorization is terminated or revoked.  Performed at [facility], [place] [address], [place], [place] [address]   Blood culture (routine x 2)     Status: None   Collection Time: 07/28/21  2:05 AM   Specimen: BLOOD  Result Value Ref Range Status   Specimen Description   Final    BLOOD BLOOD RIGHT FOREARM Performed at [facility] [address] [place], [place] [address]    Special Requests   Final    BOTTLES DRAWN AEROBIC AND ANAEROBIC Blood Culture adequate volume Performed at [facility] [address], [place], [place] [address]    Culture   Final    NO GROWTH 5 DAYS Performed at [facility], [place] [address], [place],  [address]    Report Status 08/02/2021 FINAL  Final     Patient was seen and examined on the day of discharge and was found to be in stable condition. He was transferred overnight 8/29.    SIGNED:  Dessa Phi, DO [facility] 08/04/2021, 7:13 AM

## 2021-08-04 NOTE — Progress Notes (Signed)
Patient discharged to [facility] on 08/03/21 at 2131.

## 2021-08-05 DIAGNOSIS — I10 Essential (primary) hypertension: Secondary | ICD-10-CM | POA: Diagnosis not present

## 2021-08-05 DIAGNOSIS — Z9689 Presence of other specified functional implants: Secondary | ICD-10-CM | POA: Diagnosis not present

## 2021-08-05 DIAGNOSIS — C240 Malignant neoplasm of extrahepatic bile duct: Secondary | ICD-10-CM | POA: Diagnosis not present

## 2021-08-05 DIAGNOSIS — D72829 Elevated white blood cell count, unspecified: Secondary | ICD-10-CM | POA: Diagnosis not present

## 2021-08-05 DIAGNOSIS — K831 Obstruction of bile duct: Secondary | ICD-10-CM | POA: Diagnosis not present

## 2021-08-05 DIAGNOSIS — R590 Localized enlarged lymph nodes: Secondary | ICD-10-CM | POA: Diagnosis not present

## 2021-08-05 DIAGNOSIS — Z4682 Encounter for fitting and adjustment of non-vascular catheter: Secondary | ICD-10-CM | POA: Diagnosis not present

## 2021-08-05 DIAGNOSIS — K8309 Other cholangitis: Secondary | ICD-10-CM | POA: Diagnosis not present

## 2021-08-05 DIAGNOSIS — I639 Cerebral infarction, unspecified: Secondary | ICD-10-CM | POA: Diagnosis not present

## 2021-08-05 DIAGNOSIS — R21 Rash and other nonspecific skin eruption: Secondary | ICD-10-CM | POA: Diagnosis not present

## 2021-08-05 DIAGNOSIS — R451 Restlessness and agitation: Secondary | ICD-10-CM | POA: Diagnosis not present

## 2021-08-05 DIAGNOSIS — K5900 Constipation, unspecified: Secondary | ICD-10-CM | POA: Diagnosis not present

## 2021-08-05 DIAGNOSIS — L304 Erythema intertrigo: Secondary | ICD-10-CM | POA: Diagnosis not present

## 2021-08-05 DIAGNOSIS — F329 Major depressive disorder, single episode, unspecified: Secondary | ICD-10-CM | POA: Diagnosis not present

## 2021-08-06 DIAGNOSIS — Z8673 Personal history of transient ischemic attack (TIA), and cerebral infarction without residual deficits: Secondary | ICD-10-CM | POA: Diagnosis not present

## 2021-08-06 DIAGNOSIS — D72829 Elevated white blood cell count, unspecified: Secondary | ICD-10-CM | POA: Diagnosis not present

## 2021-08-06 DIAGNOSIS — R7401 Elevation of levels of liver transaminase levels: Secondary | ICD-10-CM | POA: Diagnosis not present

## 2021-08-06 DIAGNOSIS — I10 Essential (primary) hypertension: Secondary | ICD-10-CM | POA: Diagnosis not present

## 2021-08-06 DIAGNOSIS — K831 Obstruction of bile duct: Secondary | ICD-10-CM | POA: Diagnosis not present

## 2021-08-06 DIAGNOSIS — Z9689 Presence of other specified functional implants: Secondary | ICD-10-CM | POA: Diagnosis not present

## 2021-08-06 DIAGNOSIS — F331 Major depressive disorder, recurrent, moderate: Secondary | ICD-10-CM | POA: Diagnosis not present

## 2021-08-06 DIAGNOSIS — K8309 Other cholangitis: Secondary | ICD-10-CM | POA: Diagnosis not present

## 2021-08-06 DIAGNOSIS — L304 Erythema intertrigo: Secondary | ICD-10-CM | POA: Diagnosis not present

## 2021-08-06 DIAGNOSIS — C240 Malignant neoplasm of extrahepatic bile duct: Secondary | ICD-10-CM | POA: Diagnosis not present

## 2021-08-11 ENCOUNTER — Encounter: Payer: PPO | Admitting: Physical Medicine & Rehabilitation

## 2021-08-13 DIAGNOSIS — Z7982 Long term (current) use of aspirin: Secondary | ICD-10-CM | POA: Diagnosis not present

## 2021-08-13 DIAGNOSIS — Z87442 Personal history of urinary calculi: Secondary | ICD-10-CM | POA: Diagnosis not present

## 2021-08-13 DIAGNOSIS — Z800 Family history of malignant neoplasm of digestive organs: Secondary | ICD-10-CM | POA: Diagnosis not present

## 2021-08-13 DIAGNOSIS — C221 Intrahepatic bile duct carcinoma: Secondary | ICD-10-CM | POA: Diagnosis not present

## 2021-08-13 DIAGNOSIS — Z8673 Personal history of transient ischemic attack (TIA), and cerebral infarction without residual deficits: Secondary | ICD-10-CM | POA: Diagnosis not present

## 2021-08-13 DIAGNOSIS — Z5111 Encounter for antineoplastic chemotherapy: Secondary | ICD-10-CM | POA: Diagnosis not present

## 2021-08-13 DIAGNOSIS — Z87891 Personal history of nicotine dependence: Secondary | ICD-10-CM | POA: Diagnosis not present

## 2021-08-13 DIAGNOSIS — Z79899 Other long term (current) drug therapy: Secondary | ICD-10-CM | POA: Diagnosis not present

## 2021-08-13 NOTE — Progress Notes (Signed)
Carelink Summary Report / Loop Recorder 

## 2021-08-18 DIAGNOSIS — C221 Intrahepatic bile duct carcinoma: Secondary | ICD-10-CM | POA: Diagnosis not present

## 2021-08-18 DIAGNOSIS — K831 Obstruction of bile duct: Secondary | ICD-10-CM | POA: Diagnosis not present

## 2021-08-18 DIAGNOSIS — R17 Unspecified jaundice: Secondary | ICD-10-CM | POA: Diagnosis not present

## 2021-08-20 DIAGNOSIS — Z95828 Presence of other vascular implants and grafts: Secondary | ICD-10-CM | POA: Diagnosis not present

## 2021-08-20 DIAGNOSIS — C221 Intrahepatic bile duct carcinoma: Secondary | ICD-10-CM | POA: Diagnosis not present

## 2021-08-25 ENCOUNTER — Encounter: Payer: Self-pay | Admitting: Rehabilitation

## 2021-08-25 ENCOUNTER — Ambulatory Visit: Payer: PPO | Attending: Internal Medicine | Admitting: Rehabilitation

## 2021-08-25 ENCOUNTER — Ambulatory Visit: Payer: PPO | Admitting: Occupational Therapy

## 2021-08-25 ENCOUNTER — Other Ambulatory Visit: Payer: Self-pay

## 2021-08-25 DIAGNOSIS — R2681 Unsteadiness on feet: Secondary | ICD-10-CM | POA: Diagnosis not present

## 2021-08-25 DIAGNOSIS — R29818 Other symptoms and signs involving the nervous system: Secondary | ICD-10-CM | POA: Diagnosis not present

## 2021-08-25 DIAGNOSIS — I69354 Hemiplegia and hemiparesis following cerebral infarction affecting left non-dominant side: Secondary | ICD-10-CM

## 2021-08-25 DIAGNOSIS — M6281 Muscle weakness (generalized): Secondary | ICD-10-CM | POA: Insufficient documentation

## 2021-08-25 DIAGNOSIS — R278 Other lack of coordination: Secondary | ICD-10-CM

## 2021-08-25 DIAGNOSIS — R41840 Attention and concentration deficit: Secondary | ICD-10-CM | POA: Insufficient documentation

## 2021-08-25 DIAGNOSIS — R2689 Other abnormalities of gait and mobility: Secondary | ICD-10-CM | POA: Insufficient documentation

## 2021-08-25 NOTE — Therapy (Signed)
[facility] [address] [place], [place], [address] Phone: [phone]   Fax:  [phone]  Physical Therapy Evaluation  Patient Details  Name: Ruben Chavez MRN: [id number] Date of Birth: 09-Jun-1951 Referring Provider (PT): Leeann Must, MD [facility] MD)   Encounter Date: 08/25/2021   PT End of Session - 08/25/21 1219     Visit Number 1    Number of Visits 17    Date for PT Re-Evaluation 10/24/21    Authorization Type [employer]    Progress Note Due on Visit 10    PT Start Time 1017    PT Stop Time 1102    PT Time Calculation (min) 45 min    Activity Tolerance Patient tolerated treatment well    Behavior During Therapy [facility] for tasks assessed/performed             Past Medical History:  Diagnosis Date   Elevated PSA    Hyperlipidemia    pt says not anymore   Hypertension    Prostate cancer ([place]) 2013    Past Surgical History:  Procedure Laterality Date   bilateral inguinal hernia  2008   BILIARY BRUSHING  06/23/2021   Procedure: BILIARY BRUSHING;  Surgeon: Irving Copas., MD;  Location: [facility];  Service: Gastroenterology;;   BILIARY DILATION  06/23/2021   Procedure: BILIARY DILATION;  Surgeon: Irving Copas., MD;  Location: [facility];  Service: Gastroenterology;;   BILIARY STENT PLACEMENT N/A 06/23/2021   Procedure: BILIARY STENT PLACEMENT;  Surgeon: Irving Copas., MD;  Location: [facility];  Service: Gastroenterology;  Laterality: N/A;   BIOPSY  06/23/2021   Procedure: BIOPSY;  Surgeon: Rush Landmark Telford Nab., MD;  Location: [facility];  Service: Gastroenterology;;   Kathleen Argue STUDY  01/28/2020   Procedure: BUBBLE STUDY;  Surgeon: [name], MD;  Location: [facility];  Service: Cardiovascular;;   CYST REMOVAL TRUNK  2016   sebaceous cyst   CYSTOSCOPY  04/22/2016   Procedure: CYSTOSCOPY;  Surgeon: [name], MD;  Location: [facility];  Service: Urology;;   ERCP N/A 06/23/2021   Procedure: ENDOSCOPIC RETROGRADE CHOLANGIOPANCREATOGRAPHY (ERCP);  Surgeon: Irving Copas., MD;  Location: [facility];  Service: Gastroenterology;  Laterality: N/A;   IR ANGIO INTRA EXTRACRAN SEL COM CAROTID INNOMINATE BILAT MOD SED  05/30/2020   IR ANGIO VERTEBRAL SEL SUBCLAVIAN INNOMINATE UNI R MOD SED  01/25/2020   IR ANGIO VERTEBRAL SEL VERTEBRAL BILAT MOD SED  05/30/2020   IR CT HEAD LTD  01/25/2020   IR PERCUTANEOUS ART THROMBECTOMY/INFUSION INTRACRANIAL INC DIAG ANGIO  01/25/2020   IR US GUIDE VASC ACCESS RIGHT  05/30/2020   KNEE ARTHROSCOPY  2007   right   LOOP RECORDER INSERTION N/A 01/28/2020   Procedure: LOOP RECORDER INSERTION;  Surgeon: [name], MD;  Location: [facility];  Service: Cardiovascular;  Laterality: N/A;   PANCREATIC STENT PLACEMENT  06/23/2021   Procedure: PANCREATIC STENT PLACEMENT;  Surgeon: Irving Copas., MD;  Location: [facility];  Service: Gastroenterology;;   PROSTATE BIOPSY  2008   PROSTATE BIOPSY  2013   PROSTATE BIOPSY  01/28/2016   RADIOACTIVE SEED IMPLANT N/A 04/22/2016   Procedure: RADIOACTIVE SEED IMPLANT/BRACHYTHERAPY IMPLANT;  Surgeon: [name], MD;  Location: [facility];  Service: Urology;  Laterality: N/A;   RADIOLOGY WITH ANESTHESIA N/A 01/24/2020   Procedure: IR WITH ANESTHESIA;  Surgeon: [name], MD;  Location: [facility];  Service: Radiology;  Laterality: N/A;   REMOVAL OF STONES  06/23/2021   Procedure: REMOVAL OF STONES;  Surgeon: Rush Landmark Telford Nab., MD;  Location: [facility];  Service: Gastroenterology;;   Joan Mayans  06/23/2021   Procedure: Joan Mayans;  Surgeon: Rush Landmark Telford Nab., MD;  Location: [facility];  Service: Gastroenterology;;   TEE WITHOUT CARDIOVERSION N/A 01/28/2020   Procedure: TRANSESOPHAGEAL ECHOCARDIOGRAM (TEE);  Surgeon: [name], MD;  Location: [facility];  Service:  Cardiovascular;  Laterality: N/A;   TONSILLECTOMY     TOTAL HIP ARTHROPLASTY  2001   left   TOTAL HIP ARTHROPLASTY Right 01/18/2018   Procedure: RIGHT TOTAL HIP ARTHROPLASTY ANTERIOR APPROACH;  Surgeon: [name], MD;  Location: [facility];  Service: Orthopedics;  Laterality: Right;    There were no vitals filed for this visit.    Subjective Assessment - 08/25/21 1027     Subjective Pt talks about weakness and decreased balance following discharge from hospital in Feb 2018 and then was newly diagnosed with cancer (cholangiocarcinoma) and is receiving chemotherapy weekly in hopes of reducing size of tumor for surgery.    Pertinent History history of CVA in 01/2020, recent dx of cholangiocarcinoma in July 2022 getting weekly chemo treatments    Limitations Walking;Standing    How long can you stand comfortably? 3-4 mins    How long can you walk comfortably? 5-10 mins    Patient Stated Goals "To not worry about being off balance."    Currently in Pain? No/denies   does have intermittent lower back pain               [facility] PT Assessment - 08/25/21 1031       Assessment   Medical Diagnosis Cholangiocarcinoma (debility and imbalance following this)    Referring Provider (PT) Leeann Must, MD   [facility] MD   Onset Date/Surgical Date --   Notes a decline in balance/function since stomach infection   Hand Dominance Right    Prior Therapy Has had previous OT/PT at this clinic      Precautions   Precautions Fall    Precaution Comments Active cancer, waste collection back      Balance Screen   Has the patient fallen in the past 6 months Yes    How many times? 1   slipped out of hospital bed   Has the patient had a decrease in activity level because of a fear of falling?  No    Is the patient reluctant to leave their home because of a fear of falling?  No      Home Environment   Living Environment Private residence    Living Arrangements Spouse/significant other     Available Help at Discharge Family;Available PRN/intermittently    Type of Home House    Home Access Stairs to enter    Entrance Stairs-Number of Steps 1    Entrance Stairs-Rails None    Home Layout Two level;Able to live on main level with bedroom/bathroom   on main level in hospital bed   Alternate Level Stairs-Number of Steps 13    Alternate Level Stairs-Rails [place] - single point;Shower seat;Grab bars - tub/shower;Hospital bed   walk in shower     Prior Function   Level of Independence Independent with basic ADLs;Independent with household mobility without device;Independent with community mobility with device;Independent with transfers    Vocation Retired    Leisure Read and watch TV, would like to get back to being  able to get in/out of boat (used to live on boat)-is still able to do      Cognition   Overall Cognitive Status History of cognitive impairments - at baseline    Memory Impaired      Sensation   Light Touch Impaired Detail    Light Touch Impaired Details Impaired LUE      Coordination   Gross Motor Movements are Fluid and Coordinated Yes    Fine Motor Movements are Fluid and Coordinated Yes      ROM / Strength   AROM / PROM / Strength Strength      Strength   Overall Strength Deficits    Overall Strength Comments R LE overall 4/5 (hip flex 3+/5).  L hip flex 3+/5, L knee ext 4/5, L knee flex 3+/5, L ankle DF 4/5.  Hip abd/add 4/5 in seated position      Transfers   Transfers Sit to Stand;Stand to Sit    Sit to Stand 6: Modified independent (Device/Increase time)    Five time sit to stand comments  14.75 secs without UE support    Stand to Sit 6: Modified independent (Device/Increase time)      Ambulation/Gait   Ambulation/Gait Yes    Ambulation/Gait Assistance 5: Supervision    Ambulation/Gait Assistance Details For safety with higher level balance testing    Ambulation Distance (Feet) 200 Feet   around therapy gym   Assistive  device None    Gait Pattern Step-through pattern;Decreased stride length;Trunk flexed    Ambulation Surface Level;Indoor    Gait velocity 2.00 ft/sec without AD    Stairs Yes    Stairs Assistance 5: Supervision    Stair Management Technique One rail Right;Alternating pattern;Forwards    Number of Stairs 4    Height of Stairs 6      Functional Gait  Assessment   Gait assessed  Yes    Gait Level Surface Walks 20 ft, slow speed, abnormal gait pattern, evidence for imbalance or deviates 10-15 in outside of the 12 in walkway width. Requires more than 7 sec to ambulate 20 ft.   11 secs   Change in Gait Speed Able to change speed, demonstrates mild gait deviations, deviates 6-10 in outside of the 12 in walkway width, or no gait deviations, unable to achieve a major change in velocity, or uses a change in velocity, or uses an assistive device.    Gait with Horizontal Head Turns Performs head turns smoothly with slight change in gait velocity (eg, minor disruption to smooth gait path), deviates 6-10 in outside 12 in walkway width, or uses an assistive device.    Gait with Vertical Head Turns Performs task with slight change in gait velocity (eg, minor disruption to smooth gait path), deviates 6 - 10 in outside 12 in walkway width or uses assistive device    Gait and Pivot Turn Pivot turns safely within 3 sec and stops quickly with no loss of balance.    Step Over Obstacle Is able to step over one shoe box (4.5 in total height) but must slow down and adjust steps to clear box safely. May require verbal cueing.    Gait with Narrow Base of Support Ambulates 4-7 steps.    Gait with Eyes Closed Walks 20 ft, uses assistive device, slower speed, mild gait deviations, deviates 6-10 in outside 12 in walkway width. Ambulates 20 ft in less than 9 sec but greater than 7 sec.    Ambulating Backwards Walks  20 ft, uses assistive device, slower speed, mild gait deviations, deviates 6-10 in outside 12 in walkway width.     Steps Alternating feet, must use rail.    Total Score 18    FGA comment: < 19 = high risk fall                        Objective measurements completed on examination: See above findings.                PT Education - 08/25/21 1219     Education Details Educated on evaluation findings, POC, goals    Person(s) Educated Patient    Methods Explanation    Comprehension Verbalized understanding              PT Short Term Goals - 08/25/21 1226       PT SHORT TERM GOAL #1   Title Pt will be independent with initial HEP in order to build upon functional gains made in therapy. ALL STGS DUE 09/24/21    Time 4    Period Weeks    Status New    Target Date 09/24/21      PT SHORT TERM GOAL #2   Title Pt will improve FGA score to at least a 22/30 in order to indicate decr fall risk.    Baseline 18/30    Time 4    Period Weeks    Status New      PT SHORT TERM GOAL #3   Title Pt will ambulate at least 300' over level surfaces while scanning environment with supervision and no LOB.    Time 4    Period Weeks    Status New      PT SHORT TERM GOAL #4   Title Pt will decr 5x sit <> stand time to 11.5 seconds or less in order to indicate improved functional LE strength.    Baseline 14.75  secs    Time 4    Period Weeks    Status New      PT SHORT TERM GOAL #5   Title Pt will improve gait speed to at least 2.6 ft/sec in order to demo improved gait efficiency.    Baseline 2.00 ft/sec    Time 4    Period Weeks    Status New               PT Long Term Goals - 08/25/21 1227       PT LONG TERM GOAL #1   Title Pt will be independent with final HEP in order to build upon functional gains made in therapy. ALL LTGS DUE 10/24/21    Time 8    Period Weeks    Status New    Target Date 10/24/21      PT LONG TERM GOAL #2   Title Pt will improve FGA score to at least a 26/30 in order to indicate decr fall risk.    Time 8    Period Weeks    Status  New      PT LONG TERM GOAL #3   Title 30 second chair stand goal to be written as appropriate to demo improved LE strength and endurance.    Time 8    Period Weeks    Status New      PT LONG TERM GOAL #4   Title Pt will improve gait speed to at least 3.2 ft/sec in order to demo improved gait  efficiency in the community.    Time 8    Period Weeks    Status New      PT LONG TERM GOAL #5   Title Pt will ambulate at least 500' over unlevel surfaces with mod I and scanning environment in order to improve community mobility.    Time 8    Period Weeks    Status New                    Plan - 08/25/21 1220     Clinical Impression Statement Pt is pleasant 70 yo male who is familiar to this clinic last year receiving therapy post CVA (01/2021) with recent diagnosis of cholangiocarcinoma s/p biliary stent in July of 2022 and HTN as well as HLD.  He reports decline in balance and strength since leaving hospital.  Upon PT evaluation, note gait speed of 2.103f/sec which is indicative of limited community ambulator, 5TSS time of 14.75 secs indicative of increased fall risk and decreased functional strength, and FGA score of 18/30 (a decline from 28/30 at last episode of care) indicative of high fall risk.  Pt will benefit from skilled OP neuro to address deficits.    Personal Factors and Comorbidities Age;Comorbidity 3+    Comorbidities see above    Examination-Activity Limitations Locomotion Level;Squat;Stairs;Stand    Examination-Participation Restrictions Community Activity;Driving    Stability/Clinical Decision Making Evolving/Moderate complexity    Clinical Decision Making Moderate    Rehab Potential Good    PT Frequency 2x / week    PT Duration 8 weeks    PT Treatment/Interventions ADLs/Self Care Home Management;Gait training;Stair training;Functional mobility training;Therapeutic activities;Therapeutic exercise;Balance training;Neuromuscular re-education;Patient/family  education;Vestibular    PT Next Visit Plan 30 sec chair stand, Initiate HEP for strength (sit<>stand, standing hip strengthening) and balance-can look at old HEP to see if it can be modified, balance on compliant surfaces, gait speed, nustep/scifit    Consulted and Agree with Plan of Care Patient             Patient will benefit from skilled therapeutic intervention in order to improve the following deficits and impairments:  Decreased activity tolerance, Decreased balance, Decreased endurance, Decreased mobility, Decreased strength, Difficulty walking, Impaired perceived functional ability, Postural dysfunction  Visit Diagnosis: Muscle weakness (generalized)  Other abnormalities of gait and mobility  Unsteadiness on feet     Problem List Patient Active Problem List   Diagnosis Date Noted   Cholangitis 07/28/2021   SIRS (systemic inflammatory response syndrome) ([place] 07/10/2021   Obstructive jaundice due to malignant neoplasm ([facility] 06/24/2021   [place]'s tumor ([place] 06/24/2021   Malnutrition of moderate degree 06/24/2021   Abnormal abdominal CT scan 06/19/2021   Jaundice 06/19/2021   Weight loss, unintentional 06/19/2021   Bile duct obstruction 06/19/2021   Left hemiparesis ([facility] 03/28/2020   Chronic bilateral low back pain without sciatica 02/25/2020   Spasticity as late effect of cerebrovascular accident (CVA) 02/25/2020   Cognitive and neurobehavioral dysfunction    Hypertensive urgency 01/28/2020   Urinary retention 01/28/2020   Right middle cerebral artery stroke ([facility] [place] 01/28/2020   Stroke (cerebrum) (HCC) -  [name] 01/25/2020   Acute respiratory failure with hypoxia (HCC)    Hypotension    Hypokalemia    Hypocalcemia    Encephalopathy acute    Stroke ([facility] 01/24/2020   Middle cerebral artery embolism, right 01/24/2020  Hyperlipidemia 02/21/2019    Impingement syndrome of right shoulder region 02/21/2018   OA (osteoarthritis) of hip 01/18/2018   Prostate cancer ([place]) 03/04/2016   Essential hypertension 11/27/2014   History of colonic polyps 09/26/2014   BACK PAIN, LEFT 12/22/2009   PROSTATE SPECIFIC ANTIGEN, ELEVATED 12/20/2008   LATERAL EPICONDYLITIS 07/15/2008   ACTINIC KERATOSIS, FOREHEAD, LEFT 05/23/2008   WART, LEFT HAND 07/19/2007   ERECTILE DYSFUNCTION, MILD 07/19/2007   HERNIA, BILATERAL INGUINAL W/O OBST/GANGRENE 07/19/2007   MENISCUS TEAR 07/19/2007   Bilateral inguinal hernia 07/19/2007    Cameron Sprang, PT, MPT [facility] [address] [place], [place], [address] Phone: [phone]   Fax:  [phone] 08/25/21, 12:31 PM   Name: Ruben Chavez MRN: [id number] Date of Birth: 1951-11-25

## 2021-08-26 NOTE — Addendum Note (Signed)
Addended by: Hans Eden on: 08/26/2021 08:29 AM   Modules accepted: Orders

## 2021-08-26 NOTE — Therapy (Signed)
[facility] [address] [place], [place], [address] Phone: [phone]   Fax:  [phone]  Occupational Therapy Evaluation  Patient Details  Name: Ruben Chavez MRN: [id number] Date of Birth: [birth year] Referring Provider (OT): Dr. Cyndi Lennert   Encounter Date: 08/25/2021   OT End of Session - 08/26/21 0810     Visit Number 1    Number of Visits 17    Date for OT Re-Evaluation 10/25/21    Authorization Type [employer]    Progress Note Due on Visit 10    OT Start Time 1100    OT Stop Time 1145    OT Time Calculation (min) 45 min    Activity Tolerance Patient tolerated treatment well    Behavior During Therapy [facility] for tasks assessed/performed             Past Medical History:  Diagnosis Date   Elevated PSA    Hyperlipidemia    pt says not anymore   Hypertension    Prostate cancer ([place]) 2013    Past Surgical History:  Procedure Laterality Date   bilateral inguinal hernia  2008   BILIARY BRUSHING  06/23/2021   Procedure: BILIARY BRUSHING;  Surgeon: Irving Copas., MD;  Location: [facility];  Service: Gastroenterology;;   BILIARY DILATION  06/23/2021   Procedure: BILIARY DILATION;  Surgeon: Irving Copas., MD;  Location: [facility];  Service: Gastroenterology;;   BILIARY STENT PLACEMENT N/A 06/23/2021   Procedure: BILIARY STENT PLACEMENT;  Surgeon: Irving Copas., MD;  Location: [facility];  Service: Gastroenterology;  Laterality: N/A;   BIOPSY  06/23/2021   Procedure: BIOPSY;  Surgeon: Rush Landmark Telford Nab., MD;  Location: [facility];  Service: Gastroenterology;;   Kathleen Argue STUDY  01/28/2020   Procedure: BUBBLE STUDY;  Surgeon: [name], MD;  Location: [facility];  Service: Cardiovascular;;   CYST REMOVAL TRUNK  2016   sebaceous cyst   CYSTOSCOPY  04/22/2016   Procedure: CYSTOSCOPY;  Surgeon: [name], MD;  Location: [facility];   Service: Urology;;   ERCP N/A 06/23/2021   Procedure: ENDOSCOPIC RETROGRADE CHOLANGIOPANCREATOGRAPHY (ERCP);  Surgeon: Irving Copas., MD;  Location: [facility];  Service: Gastroenterology;  Laterality: N/A;   IR ANGIO INTRA EXTRACRAN SEL COM CAROTID INNOMINATE BILAT MOD SED  05/30/2020   IR ANGIO VERTEBRAL SEL SUBCLAVIAN INNOMINATE UNI R MOD SED  01/25/2020   IR ANGIO VERTEBRAL SEL VERTEBRAL BILAT MOD SED  05/30/2020   IR CT HEAD LTD  01/25/2020   IR PERCUTANEOUS ART THROMBECTOMY/INFUSION INTRACRANIAL INC DIAG ANGIO  01/25/2020   IR US GUIDE VASC ACCESS RIGHT  05/30/2020   KNEE ARTHROSCOPY  2007   right   LOOP RECORDER INSERTION N/A 01/28/2020   Procedure: LOOP RECORDER INSERTION;  Surgeon: [name], MD;  Location: [facility];  Service: Cardiovascular;  Laterality: N/A;   PANCREATIC STENT PLACEMENT  06/23/2021   Procedure: PANCREATIC STENT PLACEMENT;  Surgeon: Irving Copas., MD;  Location: [facility];  Service: Gastroenterology;;   PROSTATE BIOPSY  2008   PROSTATE BIOPSY  2013   PROSTATE BIOPSY  01/28/2016   RADIOACTIVE SEED IMPLANT N/A 04/22/2016   Procedure: RADIOACTIVE SEED IMPLANT/BRACHYTHERAPY IMPLANT;  Surgeon: [name], MD;  Location: [facility];  Service: Urology;  Laterality: N/A;   RADIOLOGY WITH ANESTHESIA N/A 01/24/2020   Procedure: IR WITH ANESTHESIA;  Surgeon: [name], MD;  Location: [facility];  Service: Radiology;  Laterality: N/A;  REMOVAL OF STONES  06/23/2021   Procedure: REMOVAL OF STONES;  Surgeon: Rush Landmark Telford Nab., MD;  Location: [facility];  Service: Gastroenterology;;   Joan Mayans  06/23/2021   Procedure: Joan Mayans;  Surgeon: Mansouraty, Telford Nab., MD;  Location: [facility];  Service: Gastroenterology;;   TEE WITHOUT CARDIOVERSION N/A 01/28/2020   Procedure: TRANSESOPHAGEAL ECHOCARDIOGRAM (TEE);  Surgeon: [name], MD;  Location: [facility];  Service: Cardiovascular;  Laterality:  N/A;   TONSILLECTOMY     TOTAL HIP ARTHROPLASTY  2001   left   TOTAL HIP ARTHROPLASTY Right 01/18/2018   Procedure: RIGHT TOTAL HIP ARTHROPLASTY ANTERIOR APPROACH;  Surgeon: [name], MD;  Location: [facility];  Service: Orthopedics;  Laterality: Right;    There were no vitals filed for this visit.   Subjective Assessment - 08/25/21 1110     Subjective  I want to work on balance, a little on my arm, and endurance    Pertinent History cholangiocarcinoma recently diagnosed 06/2021. PMH: s/p CVA w/ Lt hemiparesis 01/24/20, HTN, prostate CA 2017, Rt sh surgery 2020    Limitations active CA, Hernia, waste collection bag    Patient Stated Goals work on my balance and my arm function    Currently in Pain? No/denies               [facility] OT Assessment - 08/25/21 1115       Assessment   Medical Diagnosis cholangiocarcinoma   recently diagnosed 06/2021   Referring Provider (OT) Dr. Cyndi Lennert    Onset Date/Surgical Date --   diagnosed 06/2021   Hand Dominance Right    Prior Therapy extensive OPOT last year from March - August 2021      Precautions   Precautions Fall    Precaution Comments active CA, Mudlogger Screen   Has the patient fallen in the past 6 months --   see P.T. eval     Home  Environment   Bathroom Building control surveyor;Door    Additional Comments Pt lives in 2 story home, bedroom/full bathroom on 2nd floor, 1/2 bath on first floor    Lives With Spouse      Prior Function   Level of Independence Independent with basic ADLs    Vocation Retired    Leisure Read and watch TV, would like to get back to being able to get in/out of boat (used to live on boat)-is still able to do      ADL   Eating/Feeding Modified independent    Grooming Modified independent    Upper Body Bathing Modified independent    Lower Body Bathing Modified independent    Upper Body Dressing Independent    Lower Body Dressing Modified independent   slip on shoes, elastic  waist pants   Toilet Transfer Modified independent    Tub/Shower Transfer Modified independent      IADL   Shopping --   wife does, pt sometimes accompanies   Light Housekeeping --   load dishwasher   Meal Prep Needs to have meals prepared and served   Pt can do simple snack prep one handed if needed   [place] on family or friends for transportation   has not driven since July 4287     Mobility   Mobility Status Comments walks w/ cane      Written Expression   Dominant Hand Right      Vision - History   Additional Comments TBA further  in functional context - pt did have some Lt inattention w/ stroke but this improved upon d/c last year. Pt does have upcoming eye appointment. Pt reports decreased acuity w/ small print      Activity Tolerance   Activity Tolerance Comments decreased endurance since CA      Cognition   Cognition Comments Pt reports short term memory impairments      Observation/Other Assessments   Observations Lt wrist/hand w/ minimal movement. Decreased distal control LUE especially as shoulder goes higher      Sensation   Light Touch Impaired Detail    Proprioception Impaired by gross assessment    Additional Comments no detection of light touch      Coordination   Gross Motor Movements are Fluid and Coordinated No    Fine Motor Movements are Fluid and Coordinated No    Box and Blocks LUE = 2   LUE = 10 at time of d/c 07/2020     Edema   Edema mild Lt hand      ROM / Strength   AROM / PROM / Strength AROM      AROM   Overall AROM Comments RUE AROM WNL's. LUE shoulder approx 75% but w/ decreased control especially distally - unable to control forearm rotation and wrist. Minimal wrist extension (trying to go into supination for wrist ext), Approx 50% gross finger flexion, 25-50% gross finger ext but flucutates w/ repetition                                OT Short Term Goals - 08/26/21 0820       OT SHORT TERM GOAL  #1   Title Pt will be independent with revised/updated HEP    Time 4    Period Weeks    Status New      OT SHORT TERM GOAL #2   Title Will review splint wear and care and update splints prn    Time 4    Period Weeks    Status New      OT SHORT TERM GOAL #3   Title Pt to consistently cut food with rocker knife I'ly    Baseline pt admits to having wife do it mostly    Time 4    Period Weeks    Status New      OT SHORT TERM GOAL #4   Title Pt will be able to use LUE consistently as a stabilizer for simple ADL tasks.    Time 4    Period Weeks    Status New               OT Long Term Goals - 08/26/21 9147       OT LONG TERM GOAL #1   Title Pt will improve LUE function by performing 8 on Box & Blocks test    Baseline Lt = 2 (was 10 at d/c last year)    Time 8    Period Weeks    Status New      OT LONG TERM GOAL #2   Title Pt will consistently make simple snack w/ one handed techniques and A/E prn    Time 8    Period Weeks    Status New      OT LONG TERM GOAL #3   Title Pt will perform 15 min of physical activity w/o rest to increase endurance    Time 8  Period Weeks    Status New                   Plan - 08/26/21 0811     Clinical Impression Statement Pt is a 70 y.o. male who is familiar to this clinic seen extensively by O.T. from March to August in 2021 following Rt CVA 01/24/20. Pt recently diagnosed with cholangiocarcinoma in July 2022 after symptoms of weight loss and fevers. Pt reports deconditioning, as well as decrease balance and some loss of LUE function (pt with limited use distal LUE from stroke but has declined some). Pt would benefit from O.T. to address these deficits, maximize function, increase endurance, and hopefully improve LUE function to baseline of last year.    OT Occupational Profile and History Detailed Assessment- Review of Records and additional review of physical, cognitive, psychosocial history related to current functional  performance    Occupational performance deficits (Please refer to evaluation for details): IADL's;Leisure;Social Participation    Body Structure / Function / Physical Skills Strength;Balance;Dexterity;Body mechanics;UE functional use;ROM;IADL;Endurance;Coordination;Mobility;FMC    Cognitive Skills Attention    Comorbidities Affecting Occupational Performance: Presence of comorbidities impacting occupational performance    Comorbidities impacting occupational performance description: residual Lt hemiparesis from stroke, active CA    Modification or Assistance to Complete Evaluation  No modification of tasks or assist necessary to complete eval    OT Frequency 2x / week    OT Duration 8 weeks   anticipate only 6 weeks   OT Treatment/Interventions Self-care/ADL training;Moist Heat;Fluidtherapy;DME and/or AE instruction;Therapeutic activities;Splinting;Coping strategies training;Cognitive remediation/compensation;Therapeutic exercise;Neuromuscular education;Cryotherapy;Functional Mobility Training;Passive range of motion;Visual/perceptual remediation/compensation;Patient/family education;Manual Therapy;Electrical Stimulation;Energy conservation;Paraffin;Ultrasound    Plan update HEP for LUE, assess current splints    Consulted and Agree with Plan of Care Patient             Patient will benefit from skilled therapeutic intervention in order to improve the following deficits and impairments:   Body Structure / Function / Physical Skills: Strength, Balance, Dexterity, Body mechanics, UE functional use, ROM, IADL, Endurance, Coordination, Mobility, [facility] Cognitive Skills: Attention     Visit Diagnosis: Hemiplegia and hemiparesis following cerebral infarction affecting left non-dominant side (HCC)  Other lack of coordination  Other symptoms and signs involving the nervous system  Unsteadiness on feet  Attention and concentration deficit    Problem List Patient Active Problem List    Diagnosis Date Noted   Cholangitis 07/28/2021   SIRS (systemic inflammatory response syndrome) ([place]) 07/10/2021   Obstructive jaundice due to malignant neoplasm ([place]) 06/24/2021   [place]'s tumor ([place]) 06/24/2021   Malnutrition of moderate degree 06/24/2021   Abnormal abdominal CT scan 06/19/2021   Jaundice 06/19/2021   Weight loss, unintentional 06/19/2021   Bile duct obstruction 06/19/2021   Left hemiparesis ([place]) 03/28/2020   Chronic bilateral low back pain without sciatica 02/25/2020   Spasticity as late effect of cerebrovascular accident (CVA) 02/25/2020   Cognitive and neurobehavioral dysfunction    Hypertensive urgency 01/28/2020   Urinary retention 01/28/2020   Right middle cerebral artery stroke ([place]) 01/28/2020   Stroke (cerebrum) ([place]) -  [name] 01/25/2020   Acute respiratory failure with hypoxia (HCC)    Hypotension    Hypokalemia    Hypocalcemia    Encephalopathy acute    Stroke ([place]) 01/24/2020   Middle cerebral artery embolism, right 01/24/2020   Hyperlipidemia 02/21/2019   Impingement  syndrome of right shoulder region 02/21/2018   OA (osteoarthritis) of hip 01/18/2018   Prostate cancer ([place]) 03/04/2016   Essential hypertension 11/27/2014   History of colonic polyps 09/26/2014   BACK PAIN, LEFT 12/22/2009   PROSTATE SPECIFIC ANTIGEN, ELEVATED 12/20/2008   LATERAL EPICONDYLITIS 07/15/2008   ACTINIC KERATOSIS, FOREHEAD, LEFT 05/23/2008   WART, LEFT HAND 07/19/2007   ERECTILE DYSFUNCTION, MILD 07/19/2007   HERNIA, BILATERAL INGUINAL W/O OBST/GANGRENE 07/19/2007   MENISCUS TEAR 07/19/2007   Bilateral inguinal hernia 07/19/2007    Carey Bullocks, OTR/L 08/26/2021, 8:25 AM  [place] [address] [place], [place], [address] Phone: [phone]   Fax:  [phone]  Name: Ruben Chavez MRN: [id number] Date of Birth: Sep 25, 1951

## 2021-08-27 DIAGNOSIS — Z5111 Encounter for antineoplastic chemotherapy: Secondary | ICD-10-CM | POA: Diagnosis not present

## 2021-08-27 DIAGNOSIS — G8194 Hemiplegia, unspecified affecting left nondominant side: Secondary | ICD-10-CM | POA: Diagnosis not present

## 2021-08-27 DIAGNOSIS — C221 Intrahepatic bile duct carcinoma: Secondary | ICD-10-CM | POA: Diagnosis not present

## 2021-08-27 DIAGNOSIS — C801 Malignant (primary) neoplasm, unspecified: Secondary | ICD-10-CM | POA: Diagnosis not present

## 2021-08-28 ENCOUNTER — Encounter: Payer: PPO | Admitting: Physical Medicine and Rehabilitation

## 2021-08-31 ENCOUNTER — Ambulatory Visit: Payer: PPO | Admitting: Occupational Therapy

## 2021-08-31 ENCOUNTER — Other Ambulatory Visit: Payer: Self-pay

## 2021-08-31 DIAGNOSIS — R278 Other lack of coordination: Secondary | ICD-10-CM

## 2021-08-31 DIAGNOSIS — I69354 Hemiplegia and hemiparesis following cerebral infarction affecting left non-dominant side: Secondary | ICD-10-CM

## 2021-08-31 DIAGNOSIS — R29818 Other symptoms and signs involving the nervous system: Secondary | ICD-10-CM

## 2021-08-31 DIAGNOSIS — R41840 Attention and concentration deficit: Secondary | ICD-10-CM

## 2021-08-31 DIAGNOSIS — M6281 Muscle weakness (generalized): Secondary | ICD-10-CM | POA: Diagnosis not present

## 2021-08-31 NOTE — Patient Instructions (Signed)
Cranial Flexion: Overhead Arm Extension - Supine (Medicine Diona Foley)    Lie with knees bent, arms beyond head, holding paper towel roll. Lift paper towel roll up to and above face, pressing hands together Repeat _10___ times per set. Do __2-3_ sets per day

## 2021-08-31 NOTE — Therapy (Signed)
[facility] [address] [place], [place], [address] Phone: [phone]   Fax:  [phone]  Occupational Therapy Treatment  Patient Details  Name: Ruben Chavez MRN: [id number] Date of Birth: 08-30-1951 Referring Provider (OT): Dr. Cyndi Lennert   Encounter Date: 08/31/2021   OT End of Session - 08/31/21 1209     Visit Number 2    Number of Visits 17    Date for OT Re-Evaluation 10/25/21    Authorization Type [employer]    Progress Note Due on Visit 10    OT Start Time 1115   pt arrived late   OT Stop Time 1147    OT Time Calculation (min) 32 min    Activity Tolerance Patient tolerated treatment well    Behavior During Therapy [facility] for tasks assessed/performed             Past Medical History:  Diagnosis Date   Elevated PSA    Hyperlipidemia    pt says not anymore   Hypertension    Prostate cancer ([place]) 2013    Past Surgical History:  Procedure Laterality Date   bilateral inguinal hernia  2008   BILIARY BRUSHING  06/23/2021   Procedure: BILIARY BRUSHING;  Surgeon: Irving Copas., MD;  Location: [facility];  Service: Gastroenterology;;   BILIARY DILATION  06/23/2021   Procedure: BILIARY DILATION;  Surgeon: Irving Copas., MD;  Location: [facility];  Service: Gastroenterology;;   BILIARY STENT PLACEMENT N/A 06/23/2021   Procedure: BILIARY STENT PLACEMENT;  Surgeon: Irving Copas., MD;  Location: [facility];  Service: Gastroenterology;  Laterality: N/A;   BIOPSY  06/23/2021   Procedure: BIOPSY;  Surgeon: Rush Landmark Telford Nab., MD;  Location: [facility];  Service: Gastroenterology;;   Kathleen Argue STUDY  01/28/2020   Procedure: BUBBLE STUDY;  Surgeon: [name], MD;  Location: [facility];  Service: Cardiovascular;;   CYST REMOVAL TRUNK  2016   sebaceous cyst   CYSTOSCOPY  04/22/2016   Procedure: CYSTOSCOPY;  Surgeon: [name], MD;  Location[facility];  Service: Urology;;   ERCP N/A 06/23/2021   Procedure: ENDOSCOPIC RETROGRADE CHOLANGIOPANCREATOGRAPHY (ERCP);  Surgeon: Irving Copas., MD;  Location: [facility];  Service: Gastroenterology;  Laterality: N/A;   IR ANGIO INTRA EXTRACRAN SEL COM CAROTID INNOMINATE BILAT MOD SED  05/30/2020   IR ANGIO VERTEBRAL SEL SUBCLAVIAN INNOMINATE UNI R MOD SED  01/25/2020   IR ANGIO VERTEBRAL SEL VERTEBRAL BILAT MOD SED  05/30/2020   IR CT HEAD LTD  01/25/2020   IR PERCUTANEOUS ART THROMBECTOMY/INFUSION INTRACRANIAL INC DIAG ANGIO  01/25/2020   IR US GUIDE VASC ACCESS RIGHT  05/30/2020   KNEE ARTHROSCOPY  2007   right   LOOP RECORDER INSERTION N/A 01/28/2020   Procedure: LOOP RECORDER INSERTION;  Surgeon: [name], MD;  Location: [facility];  Service: Cardiovascular;  Laterality: N/A;   PANCREATIC STENT PLACEMENT  06/23/2021   Procedure: PANCREATIC STENT PLACEMENT;  Surgeon: Irving Copas., MD;  Location: [facility];  Service: Gastroenterology;;   PROSTATE BIOPSY  2008   PROSTATE BIOPSY  2013   PROSTATE BIOPSY  01/28/2016   RADIOACTIVE SEED IMPLANT N/A 04/22/2016   Procedure: RADIOACTIVE SEED IMPLANT/BRACHYTHERAPY IMPLANT;  Surgeon: [name], MD;  Location: [facility];  Service: Urology;  Laterality: N/A;   RADIOLOGY WITH ANESTHESIA N/A 01/24/2020   Procedure: IR WITH ANESTHESIA;  Surgeon: [name], MD;  Location: [facility];  Service: Radiology;  Laterality: N/A;   REMOVAL OF STONES  06/23/2021   Procedure: REMOVAL OF STONES;  Surgeon: Rush Landmark Telford Nab., MD;  Location: [facility];  Service: Gastroenterology;;   Joan Mayans  06/23/2021   Procedure: Joan Mayans;  Surgeon: Rush Landmark Telford Nab., MD;  Location: [facility];  Service: Gastroenterology;;   TEE WITHOUT CARDIOVERSION N/A 01/28/2020   Procedure: TRANSESOPHAGEAL ECHOCARDIOGRAM (TEE);  Surgeon: [name], MD;  Location: [facility];  Service:  Cardiovascular;  Laterality: N/A;   TONSILLECTOMY     TOTAL HIP ARTHROPLASTY  2001   left   TOTAL HIP ARTHROPLASTY Right 01/18/2018   Procedure: RIGHT TOTAL HIP ARTHROPLASTY ANTERIOR APPROACH;  Surgeon: [name], MD;  Location: [facility];  Service: Orthopedics;  Laterality: Right;    There were no vitals filed for this visit.   Subjective Assessment - 08/31/21 1114     Subjective  I forgot my splints today    Pertinent History cholangiocarcinoma recently diagnosed 06/2021. PMH: s/p CVA w/ Lt hemiparesis 01/24/20, HTN, prostate CA 2017, Rt sh surgery 2020    Limitations active CA, Hernia, waste collection bag    Patient Stated Goals work on my balance and my arm function    Currently in Pain? No/denies                                  OT Education - 08/31/21 1147     Education Details review of previously issued HEP (for neuro re-educ and functional use LUE), formal HEP for bilateral shoulder flexion supine    Person(s) Educated Patient    Methods Explanation;Tactile cues;Demonstration;Verbal cues;Handout    Comprehension Verbalized understanding;Returned demonstration;Verbal cues required;Need further instruction;Tactile cues required              OT Short Term Goals - 08/31/21 1210       OT SHORT TERM GOAL #1   Title Pt will be independent with revised/updated HEP    Time 4    Period Weeks    Status On-going      OT SHORT TERM GOAL #2   Title Will review splint wear and care and update splints prn    Time 4    Period Weeks    Status New      OT SHORT TERM GOAL #3   Title Pt to consistently cut food with rocker knife I'ly    Baseline pt admits to having wife do it mostly    Time 4    Period Weeks    Status New      OT SHORT TERM GOAL #4   Title Pt will be able to use LUE consistently as a stabilizer for simple ADL tasks.    Time 4    Period Weeks    Status New               OT Long Term Goals - 08/26/21 4970       OT LONG  TERM GOAL #1   Title Pt will improve LUE function by performing 8 on Box & Blocks test    Baseline Lt = 2 (was 10 at d/c last year)    Time 8    Period Weeks    Status New      OT LONG TERM GOAL #2   Title Pt will consistently make simple snack w/ one handed techniques and A/E prn    Time 8    Period Weeks  Status New      OT LONG TERM GOAL #3   Title Pt will perform 15 min of physical activity w/o rest to increase endurance    Time 8    Period Weeks    Status New                   Plan - 08/31/21 1210     Clinical Impression Statement Pt arrived late today. Pt needs to be redirected as pt gets easily distracted and tangential. Pt forgot splints today therefore focused on reviewing/upating HEP for neuro re-education and functional use LUE   OT Occupational Profile and History Detailed Assessment- Review of Records and additional review of physical, cognitive, psychosocial history related to current functional performance    Occupational performance deficits (Please refer to evaluation for details): IADL's;Leisure;Social Participation    Body Structure / Function / Physical Skills Strength;Balance;Dexterity;Body mechanics;UE functional use;ROM;IADL;Endurance;Coordination;Mobility;FMC    Cognitive Skills Attention    Comorbidities Affecting Occupational Performance: Presence of comorbidities impacting occupational performance    Comorbidities impacting occupational performance description: residual Lt hemiparesis from stroke, active CA    Modification or Assistance to Complete Evaluation  No modification of tasks or assist necessary to complete eval    OT Frequency 2x / week    OT Duration 8 weeks   anticipate only 6 weeks   OT Treatment/Interventions Self-care/ADL training;Moist Heat;Fluidtherapy;DME and/or AE instruction;Therapeutic activities;Splinting;Coping strategies training;Cognitive remediation/compensation;Therapeutic exercise;Neuromuscular  education;Cryotherapy;Functional Mobility Training;Passive range of motion;Visual/perceptual remediation/compensation;Patient/family education;Manual Therapy;Electrical Stimulation;Energy conservation;Paraffin;Ultrasound    Plan Review shoulder ex in supine, assess all current splints and add new strapping/hooks prn, adjust pre-fab splint to better position (only uses pre-fab as back up but needs to be in correct position)    Consulted and Agree with Plan of Care Patient             Patient will benefit from skilled therapeutic intervention in order to improve the following deficits and impairments:   Body Structure / Function / Physical Skills: Strength, Balance, Dexterity, Body mechanics, UE functional use, ROM, IADL, Endurance, Coordination, Mobility, [facility] Cognitive Skills: Attention     Visit Diagnosis: Hemiplegia and hemiparesis following cerebral infarction affecting left non-dominant side (HCC)  Other lack of coordination  Other symptoms and signs involving the nervous system  Attention and concentration deficit    Problem List Patient Active Problem List   Diagnosis Date Noted   Cholangitis 07/28/2021   SIRS (systemic inflammatory response syndrome) ([place]) 07/10/2021   Obstructive jaundice due to malignant neoplasm ([place]) 06/24/2021   Klatskin's tumor ([place]) 06/24/2021   Malnutrition of moderate degree 06/24/2021   Abnormal abdominal CT scan 06/19/2021   Jaundice 06/19/2021   Weight loss, unintentional 06/19/2021   Bile duct obstruction 06/19/2021   Left hemiparesis ([place]) 03/28/2020   Chronic bilateral low back pain without sciatica 02/25/2020   Spasticity as late effect of cerebrovascular accident (CVA) 02/25/2020   Cognitive and neurobehavioral dysfunction    Hypertensive urgency 01/28/2020   Urinary retention 01/28/2020   Right middle cerebral artery stroke ([place]) 01/28/2020   Stroke (cerebrum) (HCC) -  R MAC infarct due to R MCA occlusion s/p tPA and IR with  TICI2b recanalization - embolic secondary to unknown source 01/25/2020   Acute respiratory failure with hypoxia (HCC)    Hypotension    Hypokalemia    Hypocalcemia    Encephalopathy acute    Stroke ([place]) 01/24/2020   Middle cerebral artery embolism, right 01/24/2020   Hyperlipidemia 02/21/2019   Impingement syndrome  of right shoulder region 02/21/2018   OA (osteoarthritis) of hip 01/18/2018   Prostate cancer ([place]) 03/04/2016   Essential hypertension 11/27/2014   History of colonic polyps 09/26/2014   BACK PAIN, LEFT 12/22/2009   PROSTATE SPECIFIC ANTIGEN, ELEVATED 12/20/2008   LATERAL EPICONDYLITIS 07/15/2008   ACTINIC KERATOSIS, FOREHEAD, LEFT 05/23/2008   WART, LEFT HAND 07/19/2007   ERECTILE DYSFUNCTION, MILD 07/19/2007   HERNIA, BILATERAL INGUINAL W/O OBST/GANGRENE 07/19/2007   MENISCUS TEAR 07/19/2007   Bilateral inguinal hernia 07/19/2007    Carey Bullocks, OTR/L 08/31/2021, 12:13 PM  [facility] [address] [place], [place], [address] Phone: [phone]   Fax:  [phone]  Name: Ruben Chavez MRN: [id number] Date of Birth: [birth year]

## 2021-09-01 ENCOUNTER — Ambulatory Visit: Payer: PPO | Admitting: Internal Medicine

## 2021-09-02 ENCOUNTER — Ambulatory Visit: Payer: PPO | Admitting: Occupational Therapy

## 2021-09-02 ENCOUNTER — Other Ambulatory Visit: Payer: Self-pay

## 2021-09-02 DIAGNOSIS — I69354 Hemiplegia and hemiparesis following cerebral infarction affecting left non-dominant side: Secondary | ICD-10-CM

## 2021-09-02 DIAGNOSIS — R41840 Attention and concentration deficit: Secondary | ICD-10-CM

## 2021-09-02 DIAGNOSIS — M6281 Muscle weakness (generalized): Secondary | ICD-10-CM | POA: Diagnosis not present

## 2021-09-02 DIAGNOSIS — R278 Other lack of coordination: Secondary | ICD-10-CM

## 2021-09-02 DIAGNOSIS — R2689 Other abnormalities of gait and mobility: Secondary | ICD-10-CM

## 2021-09-02 DIAGNOSIS — R29818 Other symptoms and signs involving the nervous system: Secondary | ICD-10-CM

## 2021-09-02 NOTE — Therapy (Signed)
[facility] [address] [place], [place], [address] Phone: [phone]   Fax:  [phone]  Occupational Therapy Treatment  Patient Details  Name: Ruben Chavez MRN: [id number] Date of Birth: [birth year] Referring Provider (OT): Dr. Cyndi Lennert   Encounter Date: 09/02/2021   OT End of Session - 09/02/21 1049     Visit Number 3    Number of Visits 17    Date for OT Re-Evaluation 10/25/21    Authorization Type [employer]    Authorization - Visit Number 3    Progress Note Due on Visit 10    OT Start Time 1020    OT Stop Time 1058    OT Time Calculation (min) 38 min    Activity Tolerance Patient tolerated treatment well    Behavior During Therapy [facility] for tasks assessed/performed             Past Medical History:  Diagnosis Date   Elevated PSA    Hyperlipidemia    pt says not anymore   Hypertension    Prostate cancer ([place]) 2013    Past Surgical History:  Procedure Laterality Date   bilateral inguinal hernia  2008   BILIARY BRUSHING  06/23/2021   Procedure: BILIARY BRUSHING;  Surgeon: Irving Copas., MD;  Location: [facility];  Service: Gastroenterology;;   BILIARY DILATION  06/23/2021   Procedure: BILIARY DILATION;  Surgeon: Irving Copas., MD;  Location: [facility];  Service: Gastroenterology;;   BILIARY STENT PLACEMENT N/A 06/23/2021   Procedure: BILIARY STENT PLACEMENT;  Surgeon: Irving Copas., MD;  Location: [facility];  Service: Gastroenterology;  Laterality: N/A;   BIOPSY  06/23/2021   Procedure: BIOPSY;  Surgeon: Rush Landmark Telford Nab., MD;  Location: [facility];  Service: Gastroenterology;;   Kathleen Argue STUDY  01/28/2020   Procedure: BUBBLE STUDY;  Surgeon: [name], MD;  Location: [facility];  Service: Cardiovascular;;   CYST REMOVAL TRUNK  2016   sebaceous cyst   CYSTOSCOPY  04/22/2016   Procedure: CYSTOSCOPY;  Surgeon: [name], MD;   Location: [facility];  Service: Urology;;   ERCP N/A 06/23/2021   Procedure: ENDOSCOPIC RETROGRADE CHOLANGIOPANCREATOGRAPHY (ERCP);  Surgeon: Irving Copas., MD;  Location: [facility];  Service: Gastroenterology;  Laterality: N/A;   IR ANGIO INTRA EXTRACRAN SEL COM CAROTID INNOMINATE BILAT MOD SED  05/30/2020   IR ANGIO VERTEBRAL SEL SUBCLAVIAN INNOMINATE UNI R MOD SED  01/25/2020   IR ANGIO VERTEBRAL SEL VERTEBRAL BILAT MOD SED  05/30/2020   IR CT HEAD LTD  01/25/2020   IR PERCUTANEOUS ART THROMBECTOMY/INFUSION INTRACRANIAL INC DIAG ANGIO  01/25/2020   IR US GUIDE VASC ACCESS RIGHT  05/30/2020   KNEE ARTHROSCOPY  2007   right   LOOP RECORDER INSERTION N/A 01/28/2020   Procedure: LOOP RECORDER INSERTION;  Surgeon: [name], MD;  Location: [facility];  Service: Cardiovascular;  Laterality: N/A;   PANCREATIC STENT PLACEMENT  06/23/2021   Procedure: PANCREATIC STENT PLACEMENT;  Surgeon: Irving Copas., MD;  [facility];  Service: Gastroenterology;;   PROSTATE BIOPSY  2008   PROSTATE BIOPSY  2013   PROSTATE BIOPSY  01/28/2016   RADIOACTIVE SEED IMPLANT N/A 04/22/2016   Procedure: RADIOACTIVE SEED IMPLANT/BRACHYTHERAPY IMPLANT;  Surgeon: [name], MD;  Location: [facility];  Service: Urology;  Laterality: N/A;   RADIOLOGY WITH ANESTHESIA N/A 01/24/2020   Procedure: IR WITH ANESTHESIA;  Surgeon: [name], MD;  Location: [facility];  Service: Radiology;  Laterality: N/A;   REMOVAL OF STONES  06/23/2021   Procedure: REMOVAL OF STONES;  Surgeon: Rush Landmark Telford Nab., MD;  Location: [facility];  Service: Gastroenterology;;   Joan Mayans  06/23/2021   Procedure: Joan Mayans;  Surgeon: Rush Landmark Telford Nab., MD;  Location: [facility];  Service: Gastroenterology;;   TEE WITHOUT CARDIOVERSION N/A 01/28/2020   Procedure: TRANSESOPHAGEAL ECHOCARDIOGRAM (TEE);  Surgeon: [name], MD;  Location: [facility];   Service: Cardiovascular;  Laterality: N/A;   TONSILLECTOMY     TOTAL HIP ARTHROPLASTY  2001   left   TOTAL HIP ARTHROPLASTY Right 01/18/2018   Procedure: RIGHT TOTAL HIP ARTHROPLASTY ANTERIOR APPROACH;  Surgeon: [name], MD;  Location: [facility];  Service: Orthopedics;  Laterality: Right;    There were no vitals filed for this visit.   Subjective Assessment - 09/02/21 1029     Subjective  Pt forgot his cusstom splints today    Pertinent History cholangiocarcinoma recently diagnosed 06/2021. PMH: s/p CVA w/ Lt hemiparesis 01/24/20, HTN, prostate CA 2017, Rt sh surgery 2020    Limitations active CA, Hernia, waste collection bag    Currently in Pain? No/denies                  Treatment: Pt brought in his prefab splint, therapist adjusted for improved positioning. Pt forgot his custom splints today. Therapist reviewed previously issued supine chest press exercise, min facilitation/ v.c for performance Sidelying on left side  to perform  elbow flexion extension and forearm supination/ pronation while looking at LUE, min facilitation, v.c for focus Seated functional grasp / release of wooden dowel pegs to remove from pegboard, min facilitation/ v.c                  OT Education - 09/02/21 1212     Education Details therapist cautioned pt against repetative use of putty as it may make his hand tighter, therapsit encouraged only limited use.    Person(s) Educated Patient    Methods Explanation;Demonstration;Verbal cues    Comprehension Verbalized understanding              OT Short Term Goals - 08/31/21 1210       OT SHORT TERM GOAL #1   Title Pt Ruben be independent with revised/updated HEP    Time 4    Period Weeks    Status On-going      OT SHORT TERM GOAL #2   Title Ruben review splint wear and care and update splints prn    Time 4    Period Weeks    Status New      OT SHORT TERM GOAL #3   Title Pt to consistently cut food with rocker knife I'ly     Baseline pt admits to having wife do it mostly    Time 4    Period Weeks    Status New      OT SHORT TERM GOAL #4   Title Pt Ruben be able to use LUE consistently as a stabilizer for simple ADL tasks.    Time 4    Period Weeks    Status New               OT Long Term Goals - 08/26/21 6767       OT LONG TERM GOAL #1   Title Pt Ruben improve LUE function by performing 8 on Box & Blocks test    Baseline Lt = 2 (was 10 at d/c  last year)    Time 8    Period Weeks    Status New      OT LONG TERM GOAL #2   Title Pt Ruben consistently make simple snack w/ one handed techniques and A/E prn    Time 8    Period Weeks    Status New      OT LONG TERM GOAL #3   Title Pt Ruben perform 15 min of physical activity w/o rest to increase endurance    Time 8    Period Weeks    Status New                   Plan - 09/02/21 1132     Clinical Impression Statement Pt is progressing towards goals. He continues to require v.c to stay focused during exercises.    OT Occupational Profile and History Detailed Assessment- Review of Records and additional review of physical, cognitive, psychosocial history related to current functional performance    Occupational performance deficits (Please refer to evaluation for details): IADL's;Leisure;Social Participation    Body Structure / Function / Physical Skills Strength;Balance;Dexterity;Body mechanics;UE functional use;ROM;IADL;Endurance;Coordination;Mobility;FMC    Cognitive Skills Attention    Comorbidities Affecting Occupational Performance: Presence of comorbidities impacting occupational performance    Comorbidities impacting occupational performance description: residual Lt hemiparesis from stroke, active CA    Modification or Assistance to Complete Evaluation  No modification of tasks or assist necessary to complete eval    OT Frequency 2x / week    OT Duration 8 weeks   anticipate only 6 weeks   OT Treatment/Interventions  Self-care/ADL training;Moist Heat;Fluidtherapy;DME and/or AE instruction;Therapeutic activities;Splinting;Coping strategies training;Cognitive remediation/compensation;Therapeutic exercise;Neuromuscular education;Cryotherapy;Functional Mobility Training;Passive range of motion;Visual/perceptual remediation/compensation;Patient/family education;Manual Therapy;Electrical Stimulation;Energy conservation;Paraffin;Ultrasound    Plan assess all  customsplints and add new strapping/hooks prn, work towards goals    Consulted and Agree with Plan of Care Patient             Patient Ruben benefit from skilled therapeutic intervention in order to improve the following deficits and impairments:   Body Structure / Function / Physical Skills: Strength, Balance, Dexterity, Body mechanics, UE functional use, ROM, IADL, Endurance, Coordination, Mobility, [facility] Cognitive Skills: Attention     Visit Diagnosis: Hemiplegia and hemiparesis following cerebral infarction affecting left non-dominant side (HCC)  Other lack of coordination  Other symptoms and signs involving the nervous system  Attention and concentration deficit  Muscle weakness (generalized)  Other abnormalities of gait and mobility    Problem List Patient Active Problem List   Diagnosis Date Noted   Cholangitis 07/28/2021   SIRS (systemic inflammatory response syndrome) (HCC) 07/10/2021   Obstructive jaundice due to malignant neoplasm ([place]) 06/24/2021   Klatskin's tumor ([place]) 06/24/2021   Malnutrition of moderate degree 06/24/2021   Abnormal abdominal CT scan 06/19/2021   Jaundice 06/19/2021   Weight loss, unintentional 06/19/2021   Bile duct obstruction 06/19/2021   Left hemiparesis ([place]) 03/28/2020   Chronic bilateral low back pain without sciatica 02/25/2020   Spasticity as late effect of cerebrovascular accident (CVA) 02/25/2020   Cognitive and neurobehavioral dysfunction    Hypertensive urgency 01/28/2020   Urinary retention  01/28/2020   Right middle cerebral artery stroke ([place]) 01/28/2020   Stroke (cerebrum) ([place]) -  [name] 01/25/2020   Acute respiratory failure with hypoxia (HCC)    Hypotension    Hypokalemia  Hypocalcemia    Encephalopathy acute    Stroke ([place]) 01/24/2020   Middle cerebral artery embolism, right 01/24/2020   Hyperlipidemia 02/21/2019   Impingement syndrome of right shoulder region 02/21/2018   OA (osteoarthritis) of hip 01/18/2018   Prostate cancer ([place]) 03/04/2016   Essential hypertension 11/27/2014   History of colonic polyps 09/26/2014   BACK PAIN, LEFT 12/22/2009   PROSTATE SPECIFIC ANTIGEN, ELEVATED 12/20/2008   LATERAL EPICONDYLITIS 07/15/2008   ACTINIC KERATOSIS, FOREHEAD, LEFT 05/23/2008   WART, LEFT HAND 07/19/2007   ERECTILE DYSFUNCTION, MILD 07/19/2007   HERNIA, BILATERAL INGUINAL W/O OBST/GANGRENE 07/19/2007   MENISCUS TEAR 07/19/2007   Bilateral inguinal hernia 07/19/2007    Anastacio Bua, OT/L 09/02/2021, 12:13 PM  [facility] [address] [place], [place], [address] Phone: [phone]   Fax:  [phone]  Name: Ruben Chavez MRN: [id number] Date of Birth: 29-Jun-1951

## 2021-09-07 ENCOUNTER — Ambulatory Visit (INDEPENDENT_AMBULATORY_CARE_PROVIDER_SITE_OTHER): Payer: PPO

## 2021-09-07 DIAGNOSIS — I63519 Cerebral infarction due to unspecified occlusion or stenosis of unspecified middle cerebral artery: Secondary | ICD-10-CM

## 2021-09-07 LAB — CUP PACEART REMOTE DEVICE CHECK
Date Time Interrogation Session: 20220923230602
Implantable Pulse Generator Implant Date: 20210222

## 2021-09-09 ENCOUNTER — Ambulatory Visit: Payer: PPO | Attending: Internal Medicine | Admitting: Occupational Therapy

## 2021-09-09 ENCOUNTER — Other Ambulatory Visit: Payer: Self-pay

## 2021-09-09 ENCOUNTER — Ambulatory Visit: Payer: PPO | Admitting: Physical Therapy

## 2021-09-09 DIAGNOSIS — R278 Other lack of coordination: Secondary | ICD-10-CM | POA: Insufficient documentation

## 2021-09-09 DIAGNOSIS — R2689 Other abnormalities of gait and mobility: Secondary | ICD-10-CM | POA: Diagnosis not present

## 2021-09-09 DIAGNOSIS — R414 Neurologic neglect syndrome: Secondary | ICD-10-CM | POA: Insufficient documentation

## 2021-09-09 DIAGNOSIS — I69354 Hemiplegia and hemiparesis following cerebral infarction affecting left non-dominant side: Secondary | ICD-10-CM | POA: Diagnosis not present

## 2021-09-09 DIAGNOSIS — M6281 Muscle weakness (generalized): Secondary | ICD-10-CM | POA: Insufficient documentation

## 2021-09-09 DIAGNOSIS — R2681 Unsteadiness on feet: Secondary | ICD-10-CM | POA: Diagnosis not present

## 2021-09-09 DIAGNOSIS — R41840 Attention and concentration deficit: Secondary | ICD-10-CM | POA: Diagnosis not present

## 2021-09-09 NOTE — Therapy (Signed)
[facility] [address] [place], [place], [address] Phone: [phone]   Fax:  [phone]  Occupational Therapy Treatment  Patient Details  Name: Ruben Chavez MRN: [id number] Date of Birth: Mar 01, 1951 Referring Provider (OT): Dr. Cyndi Lennert   Encounter Date: 09/09/2021   OT End of Session - 09/09/21 1154     Visit Number 4    Number of Visits 17    Date for OT Re-Evaluation 10/25/21    Authorization Type [employer]    Authorization - Visit Number 4    Progress Note Due on Visit 10    OT Start Time 1110    OT Stop Time 1145    OT Time Calculation (min) 35 min    Activity Tolerance Patient tolerated treatment well    Behavior During Therapy [facility] for tasks assessed/performed             Past Medical History:  Diagnosis Date   Elevated PSA    Hyperlipidemia    pt says not anymore   Hypertension    Prostate cancer ([place]) 2013    Past Surgical History:  Procedure Laterality Date   bilateral inguinal hernia  2008   BILIARY BRUSHING  06/23/2021   Procedure: BILIARY BRUSHING;  Surgeon: Irving Copas., MD;  Location: [facility];  Service: Gastroenterology;;   BILIARY DILATION  06/23/2021   Procedure: BILIARY DILATION;  Surgeon: Irving Copas., MD;  Location: [facility];  Service: Gastroenterology;;   BILIARY STENT PLACEMENT N/A 06/23/2021   Procedure: BILIARY STENT PLACEMENT;  Surgeon: Irving Copas., MD;  Location: [facility];  Service: Gastroenterology;  Laterality: N/A;   BIOPSY  06/23/2021   Procedure: BIOPSY;  Surgeon: Rush Landmark Telford Nab., MD;  Location: [facility];  Service: Gastroenterology;;   Kathleen Argue STUDY  01/28/2020   Procedure: BUBBLE STUDY;  Surgeon: [name], MD;  Location: [facility];  Service: Cardiovascular;;   CYST REMOVAL TRUNK  2016   sebaceous cyst   CYSTOSCOPY  04/22/2016   Procedure: CYSTOSCOPY;  Surgeon: [name], MD;   Location: [facility];  Service: Urology;;   ERCP N/A 06/23/2021   Procedure: ENDOSCOPIC RETROGRADE CHOLANGIOPANCREATOGRAPHY (ERCP);  Surgeon: Irving Copas., MD;  Location: [facility];  Service: Gastroenterology;  Laterality: N/A;   IR ANGIO INTRA EXTRACRAN SEL COM CAROTID INNOMINATE BILAT MOD SED  05/30/2020   IR ANGIO VERTEBRAL SEL SUBCLAVIAN INNOMINATE UNI R MOD SED  01/25/2020   IR ANGIO VERTEBRAL SEL VERTEBRAL BILAT MOD SED  05/30/2020   IR CT HEAD LTD  01/25/2020   IR PERCUTANEOUS ART THROMBECTOMY/INFUSION INTRACRANIAL INC DIAG ANGIO  01/25/2020   IR US GUIDE VASC ACCESS RIGHT  05/30/2020   KNEE ARTHROSCOPY  2007   right   LOOP RECORDER INSERTION N/A 01/28/2020   Procedure: LOOP RECORDER INSERTION;  Surgeon: [name], MD;  Location: [facility];  Service: Cardiovascular;  Laterality: N/A;   PANCREATIC STENT PLACEMENT  06/23/2021   Procedure: PANCREATIC STENT PLACEMENT;  Surgeon: Irving Copas., MD;  Location: [facility];  Service: Gastroenterology;;   PROSTATE BIOPSY  2008   PROSTATE BIOPSY  2013   PROSTATE BIOPSY  01/28/2016   RADIOACTIVE SEED IMPLANT N/A 04/22/2016   Procedure: RADIOACTIVE SEED IMPLANT/BRACHYTHERAPY IMPLANT;  Surgeon: [name], MD;  Location: [facility];  Service: Urology;  Laterality: N/A;   RADIOLOGY WITH ANESTHESIA N/A 01/24/2020   Procedure: IR WITH ANESTHESIA;  Surgeon: [name], MD;  Location: [facility];  Service: Radiology;  Laterality: N/A;   REMOVAL OF STONES  06/23/2021   Procedure: REMOVAL OF STONES;  Surgeon: Rush Landmark Telford Nab., MD;  Location: [facility];  Service: Gastroenterology;;   Joan Mayans  06/23/2021   Procedure: Joan Mayans;  Surgeon: Rush Landmark Telford Nab., MD;  Location: [facility];  Service: Gastroenterology;;   TEE WITHOUT CARDIOVERSION N/A 01/28/2020   Procedure: TRANSESOPHAGEAL ECHOCARDIOGRAM (TEE);  Surgeon: [name], MD;  Location: [facility];   Service: Cardiovascular;  Laterality: N/A;   TONSILLECTOMY     TOTAL HIP ARTHROPLASTY  2001   left   TOTAL HIP ARTHROPLASTY Right 01/18/2018   Procedure: RIGHT TOTAL HIP ARTHROPLASTY ANTERIOR APPROACH;  Surgeon: [name], MD;  Location: [facility];  Service: Orthopedics;  Laterality: Right;    There were no vitals filed for this visit.   Subjective Assessment - 09/09/21 1117     Subjective  I double up the chemo treatments tomorrow. I've fallen 3 times in the last 2 weeks (slipped out of the bed and out of the chair, then standing to unplug my phone charger) - however pt not using cane in house or community    Pertinent History cholangiocarcinoma recently diagnosed 06/2021. PMH: s/p CVA w/ Lt hemiparesis 01/24/20, HTN, prostate CA 2017, Rt sh surgery 2020    Limitations active CA, Hernia, waste collection bag    Patient Stated Goals work on my balance and my arm function    Currently in Pain? No/denies              Pt arrived late today and forgot 2 custom splints again. Pt requested new strapping and therapist preferred to measure to old strap and w/ splint on but pt forgets to bring in splints and demanded strap be cut today.   Pt forgot ex in supine and forgot handout had been provided - therefore reprinted and added chest press motion as well w/ paper towel roll. Pt demo each with cues to attend to Lt hand/UE for positioning   Therapist had to assist pt in entering next week's appointments into phone.   Pt reports 3 falls over last 2 weeks but is not using cane with community ambulation.   Pt appears to have had a decline in memory and safety awareness.  Concerned re: driving and therapist expressed concern with driving but pt continues to say he is ok to drive. (He passed driving evaluation last year following stroke, but has had decline since cancer diagnosis)                       OT Short Term Goals - 08/31/21 1210       OT SHORT TERM GOAL #1   Title  Pt will be independent with revised/updated HEP    Time 4    Period Weeks    Status On-going      OT SHORT TERM GOAL #2   Title Will review splint wear and care and update splints prn    Time 4    Period Weeks    Status New      OT SHORT TERM GOAL #3   Title Pt to consistently cut food with rocker knife I'ly    Baseline pt admits to having wife do it mostly    Time 4    Period Weeks    Status New      OT SHORT TERM GOAL #4   Title Pt will be able to use LUE consistently as a stabilizer  for simple ADL tasks.    Time 4    Period Weeks    Status New               OT Long Term Goals - 08/26/21 5364       OT LONG TERM GOAL #1   Title Pt will improve LUE function by performing 8 on [employer] test    Baseline Lt = 2 (was 10 at d/c last year)    Time 8    Period Weeks    Status New      OT LONG TERM GOAL #2   Title Pt will consistently make simple snack w/ one handed techniques and A/E prn    Time 8    Period Weeks    Status New      OT LONG TERM GOAL #3   Title Pt will perform 15 min of physical activity w/o rest to increase endurance    Time 8    Period Weeks    Status New                   Plan - 09/09/21 1155     Clinical Impression Statement Pt with decreased awareness into current deficits. Pt demo decreased safety awareness and decreased cognition/memory. Pt reports 3 falls over last 2 weeks but is walking w/o cane. Pt also with decreased memory forgetting ex's and required assist to put next weeks appointments into phone. Pt also continues to forget to bring in all splints as requested last 3 visits (pt did remember pre-fab splint only)    OT Occupational Profile and History Detailed Assessment- Review of Records and additional review of physical, cognitive, psychosocial history related to current functional performance    Occupational performance deficits (Please refer to evaluation for details): IADL's;Leisure;Social Participation    Body  Structure / Function / Physical Skills Strength;Balance;Dexterity;Body mechanics;UE functional use;ROM;IADL;Endurance;Coordination;Mobility;FMC    Cognitive Skills Attention    Comorbidities Affecting Occupational Performance: Presence of comorbidities impacting occupational performance    Comorbidities impacting occupational performance description: residual Lt hemiparesis from stroke, active CA    Modification or Assistance to Complete Evaluation  No modification of tasks or assist necessary to complete eval    OT Frequency 2x / week    OT Duration 8 weeks   anticipate only 6 weeks   OT Treatment/Interventions Self-care/ADL training;Moist Heat;Fluidtherapy;DME and/or AE instruction;Therapeutic activities;Splinting;Coping strategies training;Cognitive remediation/compensation;Therapeutic exercise;Neuromuscular education;Cryotherapy;Functional Mobility Training;Passive range of motion;Visual/perceptual remediation/compensation;Patient/family education;Manual Therapy;Electrical Stimulation;Energy conservation;Paraffin;Ultrasound    Plan assess all  customsplints and add new strapping/hooks prn, work towards goals    Consulted and Agree with Plan of Care Patient             Patient will benefit from skilled therapeutic intervention in order to improve the following deficits and impairments:   Body Structure / Function / Physical Skills: Strength, Balance, Dexterity, Body mechanics, UE functional use, ROM, IADL, Endurance, Coordination, Mobility, [facility] Cognitive Skills: Attention     Visit Diagnosis: Hemiplegia and hemiparesis following cerebral infarction affecting left non-dominant side (HCC)  Other lack of coordination  Attention and concentration deficit  Neurologic neglect syndrome    Problem List Patient Active Problem List   Diagnosis Date Noted   Cholangitis 07/28/2021   SIRS (systemic inflammatory response syndrome) ([place]) 07/10/2021   Obstructive jaundice due to malignant  neoplasm ([place]) 06/24/2021   [place]'s tumor ([place]) 06/24/2021   Malnutrition of moderate degree 06/24/2021   Abnormal abdominal CT scan 06/19/2021  Jaundice 06/19/2021   Weight loss, unintentional 06/19/2021   Bile duct obstruction 06/19/2021   Left hemiparesis ([place]) 03/28/2020   Chronic bilateral low back pain without sciatica 02/25/2020   Spasticity as late effect of cerebrovascular accident (CVA) 02/25/2020   Cognitive and neurobehavioral dysfunction    Hypertensive urgency 01/28/2020   Urinary retention 01/28/2020   Right middle cerebral artery stroke ([place]) 01/28/2020   Stroke (cerebrum) ([place]) -  [name] 01/25/2020   Acute respiratory failure with hypoxia (HCC)    Hypotension    Hypokalemia    Hypocalcemia    Encephalopathy acute    Stroke ([place]) 01/24/2020   Middle cerebral artery embolism, right 01/24/2020   Hyperlipidemia 02/21/2019   Impingement syndrome of right shoulder region 02/21/2018   OA (osteoarthritis) of hip 01/18/2018   Prostate cancer ([place]) 03/04/2016   Essential hypertension 11/27/2014   History of colonic polyps 09/26/2014   BACK PAIN, LEFT 12/22/2009   PROSTATE SPECIFIC ANTIGEN, ELEVATED 12/20/2008   LATERAL EPICONDYLITIS 07/15/2008   ACTINIC KERATOSIS, FOREHEAD, LEFT 05/23/2008   WART, LEFT HAND 07/19/2007   ERECTILE DYSFUNCTION, MILD 07/19/2007   HERNIA, BILATERAL INGUINAL W/O OBST/GANGRENE 07/19/2007   MENISCUS TEAR 07/19/2007   Bilateral inguinal hernia 07/19/2007    Carey Bullocks, OTR/L 09/09/2021, 11:59 AM  [facility] [address] [place], [place], [address] Phone: [phone]   Fax:  [phone]  Name: PRADYUN ISHMAN MRN: [id number] Date of Birth: 1951-05-03

## 2021-09-10 ENCOUNTER — Other Ambulatory Visit: Payer: Self-pay

## 2021-09-10 DIAGNOSIS — Z87891 Personal history of nicotine dependence: Secondary | ICD-10-CM | POA: Diagnosis not present

## 2021-09-10 DIAGNOSIS — Z7952 Long term (current) use of systemic steroids: Secondary | ICD-10-CM | POA: Diagnosis not present

## 2021-09-10 DIAGNOSIS — Z800 Family history of malignant neoplasm of digestive organs: Secondary | ICD-10-CM | POA: Diagnosis not present

## 2021-09-10 DIAGNOSIS — Z8673 Personal history of transient ischemic attack (TIA), and cerebral infarction without residual deficits: Secondary | ICD-10-CM | POA: Diagnosis not present

## 2021-09-10 DIAGNOSIS — Z5111 Encounter for antineoplastic chemotherapy: Secondary | ICD-10-CM | POA: Diagnosis not present

## 2021-09-10 DIAGNOSIS — K5900 Constipation, unspecified: Secondary | ICD-10-CM | POA: Diagnosis not present

## 2021-09-10 DIAGNOSIS — C221 Intrahepatic bile duct carcinoma: Secondary | ICD-10-CM | POA: Diagnosis not present

## 2021-09-10 DIAGNOSIS — Z7982 Long term (current) use of aspirin: Secondary | ICD-10-CM | POA: Diagnosis not present

## 2021-09-11 ENCOUNTER — Ambulatory Visit: Payer: PPO | Admitting: Internal Medicine

## 2021-09-14 NOTE — Progress Notes (Signed)
Carelink Summary Report / Loop Recorder 

## 2021-09-16 ENCOUNTER — Ambulatory Visit: Payer: PPO | Admitting: Occupational Therapy

## 2021-09-16 ENCOUNTER — Ambulatory Visit: Payer: PPO

## 2021-09-16 ENCOUNTER — Other Ambulatory Visit: Payer: Self-pay

## 2021-09-16 DIAGNOSIS — R2681 Unsteadiness on feet: Secondary | ICD-10-CM

## 2021-09-16 DIAGNOSIS — R41840 Attention and concentration deficit: Secondary | ICD-10-CM

## 2021-09-16 DIAGNOSIS — M6281 Muscle weakness (generalized): Secondary | ICD-10-CM

## 2021-09-16 DIAGNOSIS — R2689 Other abnormalities of gait and mobility: Secondary | ICD-10-CM

## 2021-09-16 DIAGNOSIS — I69354 Hemiplegia and hemiparesis following cerebral infarction affecting left non-dominant side: Secondary | ICD-10-CM

## 2021-09-16 NOTE — Therapy (Signed)
[facility] [address] [place], [place], [address] Phone: [phone]   Fax:  [phone]  Physical Therapy Treatment  Patient Details  Name: Ruben Chavez MRN: [id number] Date of Birth: 1951/07/28 Referring Provider (PT): Leeann Must, MD [facility] MD)   Encounter Date: 09/16/2021   PT End of Session - 09/16/21 1103     Visit Number 2    Number of Visits 17    Date for PT Re-Evaluation 10/24/21    Authorization Type [employer]    Progress Note Due on Visit 10    PT Start Time 1015    PT Stop Time 1102    PT Time Calculation (min) 47 min    Activity Tolerance Patient tolerated treatment well    Behavior During Therapy [facility] for tasks assessed/performed             Past Medical History:  Diagnosis Date   Elevated PSA    Hyperlipidemia    pt says not anymore   Hypertension    Prostate cancer ([place]) 2013    Past Surgical History:  Procedure Laterality Date   bilateral inguinal hernia  2008   BILIARY BRUSHING  06/23/2021   Procedure: BILIARY BRUSHING;  Surgeon: Irving Copas., MD;  Location: [facility];  Service: Gastroenterology;;   BILIARY DILATION  06/23/2021   Procedure: BILIARY DILATION;  Surgeon: Irving Copas., MD;  Location: [facility];  Service: Gastroenterology;;   BILIARY STENT PLACEMENT N/A 06/23/2021   Procedure: BILIARY STENT PLACEMENT;  Surgeon: Irving Copas., MD;  Location: [facility];  Service: Gastroenterology;  Laterality: N/A;   BIOPSY  06/23/2021   Procedure: BIOPSY;  Surgeon: Rush Landmark Telford Nab., MD;  Location: [facility];  Service: Gastroenterology;;   Kathleen Argue STUDY  01/28/2020   Procedure: BUBBLE STUDY;  Surgeon: [name], MD;  Location: [facility];  Service: Cardiovascular;;   CYST REMOVAL TRUNK  2016   sebaceous cyst   CYSTOSCOPY  04/22/2016   Procedure: CYSTOSCOPY;  Surgeon: [name], MD;  Location: [facility];  Service: Urology;;   ERCP N/A 06/23/2021   Procedure: ENDOSCOPIC RETROGRADE CHOLANGIOPANCREATOGRAPHY (ERCP);  Surgeon: Irving Copas., MD;  Location: [facility];  Service: Gastroenterology;  Laterality: N/A;   IR ANGIO INTRA EXTRACRAN SEL COM CAROTID INNOMINATE BILAT MOD SED  05/30/2020   IR ANGIO VERTEBRAL SEL SUBCLAVIAN INNOMINATE UNI R MOD SED  01/25/2020   IR ANGIO VERTEBRAL SEL VERTEBRAL BILAT MOD SED  05/30/2020   IR CT HEAD LTD  01/25/2020   IR PERCUTANEOUS ART THROMBECTOMY/INFUSION INTRACRANIAL INC DIAG ANGIO  01/25/2020   IR US GUIDE VASC ACCESS RIGHT  05/30/2020   KNEE ARTHROSCOPY  2007   right   LOOP RECORDER INSERTION N/A 01/28/2020   Procedure: LOOP RECORDER INSERTION;  Surgeon: [name], MD;  Location: [facility];  Service: Cardiovascular;  Laterality: N/A;   PANCREATIC STENT PLACEMENT  06/23/2021   Procedure: PANCREATIC STENT PLACEMENT;  Surgeon: Irving Copas., MD;  Location: [facility];  Service: Gastroenterology;;   PROSTATE BIOPSY  2008   PROSTATE BIOPSY  2013   PROSTATE BIOPSY  01/28/2016   RADIOACTIVE SEED IMPLANT N/A 04/22/2016   Procedure: RADIOACTIVE SEED IMPLANT/BRACHYTHERAPY IMPLANT;  Surgeon: [name], MD;  Location: [facility];  Service: Urology;  Laterality: N/A;   RADIOLOGY WITH ANESTHESIA N/A 01/24/2020   Procedure: IR WITH ANESTHESIA;  Surgeon: [name], MD;  Location: [facility];  Service: Radiology;  Laterality: N/A;   REMOVAL OF STONES  06/23/2021   Procedure: REMOVAL OF STONES;  Surgeon: Rush Landmark Telford Nab., MD;  Location: [facility];  Service: Gastroenterology;;   Joan Mayans  06/23/2021   Procedure: Joan Mayans;  Surgeon: Rush Landmark Telford Nab., MD;  [facility];  Service: Gastroenterology;;   TEE WITHOUT CARDIOVERSION N/A 01/28/2020   Procedure: TRANSESOPHAGEAL ECHOCARDIOGRAM (TEE);  Surgeon: [name], MD;  Location: [facility];  Service:  Cardiovascular;  Laterality: N/A;   TONSILLECTOMY     TOTAL HIP ARTHROPLASTY  2001   left   TOTAL HIP ARTHROPLASTY Right 01/18/2018   Procedure: RIGHT TOTAL HIP ARTHROPLASTY ANTERIOR APPROACH;  Surgeon: [name], MD;  [facility]: [facility];  Service: Orthopedics;  Laterality: Right;    There were no vitals filed for this visit.   Subjective Assessment - 09/16/21 1018     Subjective Pt just finished with OT. He reports that he is just feeling weaker but still better than after initial infection in stomach. He started chemo about 3 weeks ago. Has 2 weeks on then week off on Thursdays.  Next week is his off week. Pt reports that he has been doing well with no side effects from the chemo. He has gotten appetite back and eating better. Pt brought in long list of meds that OT made a copy of that were not in the chart. Pt reports a couple falls in last month since PT eval. He slipped out of bed on to floor for one and could not get up with wife's assistance. Second fall was when reaching over to take plug out of outlet with no injury and again fire department came out to help him off floor. Pt is wearing a first alert that is sensitive for falls now.    Pertinent History history of CVA in 01/2020, recent dx of cholangiocarcinoma in July 2022 getting weekly chemo treatments    Limitations Walking;Standing    How long can you stand comfortably? 3-4 mins    How long can you walk comfortably? 5-10 mins    Patient Stated Goals "To not worry about being off balance."    Currently in Pain? No/denies                [facility] PT Assessment - 09/16/21 1029       Precautions   Precautions Fall    Precaution Comments active CA, bile duct drain on right side                           [facility] Adult PT Treatment/Exercise - 09/16/21 1029       Transfers   Transfers Sit to Stand;Stand to Sit    Sit to Stand 6: Modified independent (Device/Increase time);5: Supervision    Sit to Stand  Details Verbal cues for technique    Stand to Sit 6: Modified independent (Device/Increase time)    Comments 30 sec sit to stand=7 reps from chair without hands      Ambulation/Gait   Ambulation/Gait Yes    Ambulation/Gait Assistance 5: Supervision    Ambulation/Gait Assistance Details around in gym between activities with slow cadence    Assistive device None    Gait Pattern Step-through pattern;Decreased step length - right;Decreased step length - left;Decreased arm swing - left    Ambulation Surface Level;Indoor      Exercises   Exercises Other Exercises    Other Exercises  PT updated prior HEP as noted below performing  with patient.           Exercises Hooklying Clamshell with Resistance with red theraband- 1 x daily - 7 x weekly - 3 sets - 10 reps  -performed x 10 reps with cues to bring LLE out the same as right. Sit to Stand - 1 x daily - 7 x weekly - 3 sets - 5 reps  -performed x 5 with red theraband around thighs Forward Step Up (Mirrored) - 1 x daily - 5 x weekly - 2 sets - 10 reps  -performed at bottom of steps holding railing.5 reps each foot. Lateral Step Up with Unilateral Counter Support - 1 x daily - 5 x weekly - 2 sets - 10 reps  --performed at bottom of steps holding railing.5 reps each foot. Standing Toe Taps - 1 x daily - 7 x weekly - 2 sets - 10 reps Romberg Stance Eyes Closed on Foam Pad - 1 x daily - 5 x weekly - 3 sets - 30 hold  -performed in corner on pillow with chair in front. Increased sway with eyes closed. Standing eyes open with head motions - 2 x daily - 5 x weekly - 2 sets - 10 reps looking up/down and left/right on pillow in corner Single Leg Stance - 2 x daily - 7 x weekly - 1 sets - 4 reps - 10 sec hold- with fingertip support on right          PT Education - 09/16/21 1110     Education Details Updated prior HEP for strength and balance    Person(s) Educated Patient    Methods Explanation;Demonstration;Handout    Comprehension  Verbalized understanding;Returned demonstration              PT Short Term Goals - 08/25/21 1226       PT SHORT TERM GOAL #1   Title Pt will be independent with initial HEP in order to build upon functional gains made in therapy. ALL STGS DUE 09/24/21    Time 4    Period Weeks    Status New    Target Date 09/24/21      PT SHORT TERM GOAL #2   Title Pt will improve FGA score to at least a 22/30 in order to indicate decr fall risk.    Baseline 18/30    Time 4    Period Weeks    Status New      PT SHORT TERM GOAL #3   Title Pt will ambulate at least 300' over level surfaces while scanning environment with supervision and no LOB.    Time 4    Period Weeks    Status New      PT SHORT TERM GOAL #4   Title Pt will decr 5x sit <> stand time to 11.5 seconds or less in order to indicate improved functional LE strength.    Baseline 14.75  secs    Time 4    Period Weeks    Status New      PT SHORT TERM GOAL #5   Title Pt will improve gait speed to at least 2.6 ft/sec in order to demo improved gait efficiency.    Baseline 2.00 ft/sec    Time 4    Period Weeks    Status New               PT Long Term Goals - 08/25/21 1227       PT LONG TERM GOAL #1  Title Pt will be independent with final HEP in order to build upon functional gains made in therapy. ALL LTGS DUE 10/24/21    Time 8    Period Weeks    Status New    Target Date 10/24/21      PT LONG TERM GOAL #2   Title Pt will improve FGA score to at least a 26/30 in order to indicate decr fall risk.    Time 8    Period Weeks    Status New      PT LONG TERM GOAL #3   Title 30 second chair stand goal to be written as appropriate to demo improved LE strength and endurance.    Time 8    Period Weeks    Status New      PT LONG TERM GOAL #4   Title Pt will improve gait speed to at least 3.2 ft/sec in order to demo improved gait efficiency in the community.    Time 8    Period Weeks    Status New      PT LONG  TERM GOAL #5   Title Pt will ambulate at least 500' over unlevel surfaces with mod I and scanning environment in order to improve community mobility.    Time 8    Period Weeks    Status New                   Plan - 09/16/21 1111     Clinical Impression Statement Pt returns after almost a month since evaluation. Pt reports doing well with chemo with no side effects and getting a little more energy. PT focused on updating prior HEP to be sure it is still appropriate for patient for strengthening/balance activities. Performed 30 sec sit to stand completing 7 reps indicating decreased functional strength and activity tolerance.    Personal Factors and Comorbidities Age;Comorbidity 3+    Comorbidities see above    Examination-Activity Limitations Locomotion Level;Squat;Stairs;Stand    Examination-Participation Restrictions Community Activity;Driving    Stability/Clinical Decision Making Evolving/Moderate complexity    Rehab Potential Good    PT Frequency 2x / week    PT Duration 8 weeks    PT Treatment/Interventions ADLs/Self Care Home Management;Gait training;Stair training;Functional mobility training;Therapeutic activities;Therapeutic exercise;Balance training;Neuromuscular re-education;Patient/family education;Vestibular    PT Next Visit Plan How is updated HEP going? balance on compliant surfaces, gait speed, nustep/scifit    Consulted and Agree with Plan of Care Patient             Patient will benefit from skilled therapeutic intervention in order to improve the following deficits and impairments:  Decreased activity tolerance, Decreased balance, Decreased endurance, Decreased mobility, Decreased strength, Difficulty walking, Impaired perceived functional ability, Postural dysfunction  Visit Diagnosis: Other abnormalities of gait and mobility  Muscle weakness (generalized)  Unsteadiness on feet     Problem List Patient Active Problem List   Diagnosis Date Noted    Cholangitis 07/28/2021   SIRS (systemic inflammatory response syndrome) ([place]) 07/10/2021   Obstructive jaundice due to malignant neoplasm ([place]) 06/24/2021   Klatskin's tumor ([place]) 06/24/2021   Malnutrition of moderate degree 06/24/2021   Abnormal abdominal CT scan 06/19/2021   Jaundice 06/19/2021   Weight loss, unintentional 06/19/2021   Bile duct obstruction 06/19/2021   Left hemiparesis ([place]) 03/28/2020   Chronic bilateral low back pain without sciatica 02/25/2020   Spasticity as late effect of cerebrovascular accident (CVA) 02/25/2020   Cognitive and neurobehavioral dysfunction  Hypertensive urgency 01/28/2020   Urinary retention 01/28/2020   Right middle cerebral artery stroke ([place]) 01/28/2020   Stroke (cerebrum) ([place]) -  [name] 01/25/2020   Acute respiratory failure with hypoxia (HCC)    Hypotension    Hypokalemia    Hypocalcemia    Encephalopathy acute    Stroke ([place]) 01/24/2020   Middle cerebral artery embolism, right 01/24/2020   Hyperlipidemia 02/21/2019   Impingement syndrome of right shoulder region 02/21/2018   OA (osteoarthritis) of hip 01/18/2018   Prostate cancer ([place]) 03/04/2016   Essential hypertension 11/27/2014   History of colonic polyps 09/26/2014   BACK PAIN, LEFT 12/22/2009   PROSTATE SPECIFIC ANTIGEN, ELEVATED 12/20/2008   LATERAL EPICONDYLITIS 07/15/2008   ACTINIC KERATOSIS, FOREHEAD, LEFT 05/23/2008   WART, LEFT HAND 07/19/2007   ERECTILE DYSFUNCTION, MILD 07/19/2007   HERNIA, BILATERAL INGUINAL W/O OBST/GANGRENE 07/19/2007   MENISCUS TEAR 07/19/2007   Bilateral inguinal hernia 07/19/2007    Electa Sniff, PT, DPT, NCS 09/16/2021, 11:14 AM  [facility] [address] [place], [place], [address] Phone: [phone]   Fax:  [phone]  Name: LAVONTA TILLIS MRN:  [id number] Date of Birth: [birth year]

## 2021-09-16 NOTE — Therapy (Signed)
[facility] [address] [place], [place], [address] Phone: [phone]   Fax:  [phone]  Occupational Therapy Treatment  Patient Details  Name: Ruben Chavez MRN: [id number] Date of Birth: 11/12/1951 Referring Provider (OT): Dr. Cyndi Lennert   Encounter Date: 09/16/2021   OT End of Session - 09/16/21 1020     Visit Number 5    Number of Visits 17    Date for OT Re-Evaluation 10/25/21    Authorization Type [employer]    Authorization - Visit Number 5    Progress Note Due on Visit 10    OT Start Time 0950   pt arrived over 15 min late   OT Stop Time 1014    OT Time Calculation (min) 24 min    Activity Tolerance Patient tolerated treatment well    Behavior During Therapy [facility] for tasks assessed/performed             Past Medical History:  Diagnosis Date   Elevated PSA    Hyperlipidemia    pt says not anymore   Hypertension    Prostate cancer ([place]) 2013    Past Surgical History:  Procedure Laterality Date   bilateral inguinal hernia  2008   BILIARY BRUSHING  06/23/2021   Procedure: BILIARY BRUSHING;  Surgeon: Irving Copas., MD;  Location: [facility];  Service: Gastroenterology;;   BILIARY DILATION  06/23/2021   Procedure: BILIARY DILATION;  Surgeon: Irving Copas., MD;  Location: [facility];  Service: Gastroenterology;;   BILIARY STENT PLACEMENT N/A 06/23/2021   Procedure: BILIARY STENT PLACEMENT;  Surgeon: Irving Copas., MD;  Location: [facility];  Service: Gastroenterology;  Laterality: N/A;   BIOPSY  06/23/2021   Procedure: BIOPSY;  Surgeon: Rush Landmark Telford Nab., MD;  Location: [facility];  Service: Gastroenterology;;   Kathleen Argue STUDY  01/28/2020   Procedure: BUBBLE STUDY;  Surgeon: [name], MD;  Location: [facility];  Service: Cardiovascular;;   CYST REMOVAL TRUNK  2016   sebaceous cyst   CYSTOSCOPY  04/22/2016   Procedure: CYSTOSCOPY;  Surgeon:  [name], MD;  Location: [facility];  Service: Urology;;   ERCP N/A 06/23/2021   Procedure: ENDOSCOPIC RETROGRADE CHOLANGIOPANCREATOGRAPHY (ERCP);  Surgeon: Irving Copas., MD;  Location: [facility];  Service: Gastroenterology;  Laterality: N/A;   IR ANGIO INTRA EXTRACRAN SEL COM CAROTID INNOMINATE BILAT MOD SED  05/30/2020   IR ANGIO VERTEBRAL SEL SUBCLAVIAN INNOMINATE UNI R MOD SED  01/25/2020   IR ANGIO VERTEBRAL SEL VERTEBRAL BILAT MOD SED  05/30/2020   IR CT HEAD LTD  01/25/2020   IR PERCUTANEOUS ART THROMBECTOMY/INFUSION INTRACRANIAL INC DIAG ANGIO  01/25/2020   IR US GUIDE VASC ACCESS RIGHT  05/30/2020   KNEE ARTHROSCOPY  2007   right   LOOP RECORDER INSERTION N/A 01/28/2020   Procedure: LOOP RECORDER INSERTION;  Surgeon: [name], MD;  Location: [facility];  Service: Cardiovascular;  Laterality: N/A;   PANCREATIC STENT PLACEMENT  06/23/2021   Procedure: PANCREATIC STENT PLACEMENT;  Surgeon: Irving Copas., MD;  Location: [facility];  Service: Gastroenterology;;   PROSTATE BIOPSY  2008   PROSTATE BIOPSY  2013   PROSTATE BIOPSY  01/28/2016   RADIOACTIVE SEED IMPLANT N/A 04/22/2016   Procedure: RADIOACTIVE SEED IMPLANT/BRACHYTHERAPY IMPLANT;  Surgeon: [name], MD;  Location: [facility];  Service: Urology;  Laterality: N/A;   RADIOLOGY WITH ANESTHESIA N/A 01/24/2020   Procedure: IR WITH ANESTHESIA;  Surgeon:  [name], MD;  Location: [facility];  Service: Radiology;  Laterality: N/A;   REMOVAL OF STONES  06/23/2021   Procedure: REMOVAL OF STONES;  Surgeon: Rush Landmark Telford Nab., MD;  Location: [facility];  Service: Gastroenterology;;   Joan Mayans  06/23/2021   Procedure: Joan Mayans;  Surgeon: Rush Landmark Telford Nab., MD;  Location[facility];  Service: Gastroenterology;;   TEE WITHOUT CARDIOVERSION N/A 01/28/2020   Procedure: TRANSESOPHAGEAL ECHOCARDIOGRAM (TEE);  Surgeon: [name], MD;   Location: [facility];  Service: Cardiovascular;  Laterality: N/A;   TONSILLECTOMY     TOTAL HIP ARTHROPLASTY  2001   left   TOTAL HIP ARTHROPLASTY Right 01/18/2018   Procedure: RIGHT TOTAL HIP ARTHROPLASTY ANTERIOR APPROACH;  Surgeon: [name], MD;  Location: [facility];  Service: Orthopedics;  Laterality: Right;    There were no vitals filed for this visit.   Subjective Assessment - 09/16/21 1019     Subjective  I brought my medication list and my resting hand splint. I can't find my thumb splint    Pertinent History cholangiocarcinoma recently diagnosed 06/2021. PMH: s/p CVA w/ Lt hemiparesis 01/24/20, HTN, prostate CA 2017, Rt sh surgery 2020    Limitations active CA, Hernia, waste collection bag    Patient Stated Goals work on my balance and my arm function    Currently in Pain? No/denies              Pt late again today.   Pt brought in resting hand splint for adjustments prn - replaced all straps and 1 hook. Cleaned splint and removed debris/fuzz from hooks. Reviewed wear and care.   Pt brought in list of medications - to be scanned in Epic  Discussed bed positioning with pt and provided handout. Recommended sleeping on Rt side w/ pillow under Lt arm and leg                      OT Short Term Goals - 08/31/21 1210       OT SHORT TERM GOAL #1   Title Pt will be independent with revised/updated HEP    Time 4    Period Weeks    Status On-going      OT SHORT TERM GOAL #2   Title Will review splint wear and care and update splints prn    Time 4    Period Weeks    Status New      OT SHORT TERM GOAL #3   Title Pt to consistently cut food with rocker knife I'ly    Baseline pt admits to having wife do it mostly    Time 4    Period Weeks    Status New      OT SHORT TERM GOAL #4   Title Pt will be able to use LUE consistently as a stabilizer for simple ADL tasks.    Time 4    Period Weeks    Status New               OT Long Term Goals  - 08/26/21 0626       OT LONG TERM GOAL #1   Title Pt will improve LUE function by performing 8 on [employer] test    Baseline Lt = 2 (was 10 at d/c last year)    Time 8    Period Weeks    Status New      OT LONG TERM GOAL #2   Title Pt will consistently  make simple snack w/ one handed techniques and A/E prn    Time 8    Period Weeks    Status New      OT LONG TERM GOAL #3   Title Pt will perform 15 min of physical activity w/o rest to increase endurance    Time 8    Period Weeks    Status New                   Plan - 09/16/21 1021     Clinical Impression Statement Pt arrived late again today but did remember splint and meds list today.    OT Occupational Profile and History Detailed Assessment- Review of Records and additional review of physical, cognitive, psychosocial history related to current functional performance    Occupational performance deficits (Please refer to evaluation for details): IADL's;Leisure;Social Participation    Body Structure / Function / Physical Skills Strength;Balance;Dexterity;Body mechanics;UE functional use;ROM;IADL;Endurance;Coordination;Mobility;FMC    Cognitive Skills Attention    Comorbidities Affecting Occupational Performance: Presence of comorbidities impacting occupational performance    Comorbidities impacting occupational performance description: residual Lt hemiparesis from stroke, active CA    Modification or Assistance to Complete Evaluation  No modification of tasks or assist necessary to complete eval    OT Frequency 2x / week    OT Duration 8 weeks   anticipate only 6 weeks   OT Treatment/Interventions Self-care/ADL training;Moist Heat;Fluidtherapy;DME and/or AE instruction;Therapeutic activities;Splinting;Coping strategies training;Cognitive remediation/compensation;Therapeutic exercise;Neuromuscular education;Cryotherapy;Functional Mobility Training;Passive range of motion;Visual/perceptual  remediation/compensation;Patient/family education;Manual Therapy;Electrical Stimulation;Energy conservation;Paraffin;Ultrasound    Plan fabricate new thumb splint since pt cannot find old one (per pt request), progress towards goals    Consulted and Agree with Plan of Care Patient             Patient will benefit from skilled therapeutic intervention in order to improve the following deficits and impairments:   Body Structure / Function / Physical Skills: Strength, Balance, Dexterity, Body mechanics, UE functional use, ROM, IADL, Endurance, Coordination, Mobility, [facility] Cognitive Skills: Attention     Visit Diagnosis: Hemiplegia and hemiparesis following cerebral infarction affecting left non-dominant side (HCC)  Attention and concentration deficit    Problem List Patient Active Problem List   Diagnosis Date Noted   Cholangitis 07/28/2021   SIRS (systemic inflammatory response syndrome) ([facility]) 07/10/2021   Obstructive jaundice due to malignant neoplasm ([place]) 06/24/2021   Klatskin's tumor ([place]) 06/24/2021   Malnutrition of moderate degree 06/24/2021   Abnormal abdominal CT scan 06/19/2021   Jaundice 06/19/2021   Weight loss, unintentional 06/19/2021   Bile duct obstruction 06/19/2021   Left hemiparesis ([place]) 03/28/2020   Chronic bilateral low back pain without sciatica 02/25/2020   Spasticity as late effect of cerebrovascular accident (CVA) 02/25/2020   Cognitive and neurobehavioral dysfunction    Hypertensive urgency 01/28/2020   Urinary retention 01/28/2020   Right middle cerebral artery stroke ([place]) 01/28/2020   Stroke (cerebrum) (HCC) -  [name] 01/25/2020   Acute respiratory failure with hypoxia (HCC)    Hypotension    Hypokalemia    Hypocalcemia    Encephalopathy acute    Stroke ([place]) 01/24/2020   Middle cerebral artery embolism, right 01/24/2020    Hyperlipidemia 02/21/2019   Impingement syndrome of right shoulder region 02/21/2018   OA (osteoarthritis) of hip 01/18/2018   Prostate cancer ([place]) 03/04/2016   Essential hypertension 11/27/2014   History  of colonic polyps 09/26/2014   BACK PAIN, LEFT 12/22/2009   PROSTATE SPECIFIC ANTIGEN, ELEVATED 12/20/2008   LATERAL EPICONDYLITIS 07/15/2008   ACTINIC KERATOSIS, FOREHEAD, LEFT 05/23/2008   WART, LEFT HAND 07/19/2007   ERECTILE DYSFUNCTION, MILD 07/19/2007   HERNIA, BILATERAL INGUINAL W/O OBST/GANGRENE 07/19/2007   MENISCUS TEAR 07/19/2007   Bilateral inguinal hernia 07/19/2007    Carey Bullocks, OTR/L 09/16/2021, 10:23 AM  [facility] [address] [place], [place], [address] Phone: [phone]   Fax:  [phone]  Name: YAMATO KOPF MRN: [id number] Date of Birth: 1951-02-11

## 2021-09-16 NOTE — Patient Instructions (Signed)
Access Code: [email or web address] URL: [email or web address] Date: 09/16/2021 Prepared by: Cherly Anderson  Exercises Hooklying Clamshell with Resistance - 1 x daily - 7 x weekly - 3 sets - 10 reps Sit to Stand - 1 x daily - 7 x weekly - 3 sets - 5 reps Forward Step Up (Mirrored) - 1 x daily - 5 x weekly - 2 sets - 10 reps Lateral Step Up with Unilateral Counter Support - 1 x daily - 5 x weekly - 2 sets - 10 reps Standing Toe Taps - 1 x daily - 7 x weekly - 2 sets - 10 reps Romberg Stance Eyes Closed on Foam Pad - 1 x daily - 5 x weekly - 3 sets - 30 hold Standing eyes open with head motions - 2 x daily - 5 x weekly - 2 sets - 10 reps Single Leg Stance - 2 x daily - 7 x weekly - 1 sets - 4 reps - 10 sec hold

## 2021-09-21 ENCOUNTER — Ambulatory Visit: Payer: PPO | Admitting: Occupational Therapy

## 2021-09-21 ENCOUNTER — Other Ambulatory Visit: Payer: Self-pay

## 2021-09-21 ENCOUNTER — Ambulatory Visit: Payer: PPO

## 2021-09-21 DIAGNOSIS — R41840 Attention and concentration deficit: Secondary | ICD-10-CM

## 2021-09-21 DIAGNOSIS — M6281 Muscle weakness (generalized): Secondary | ICD-10-CM

## 2021-09-21 DIAGNOSIS — R2689 Other abnormalities of gait and mobility: Secondary | ICD-10-CM

## 2021-09-21 DIAGNOSIS — I69354 Hemiplegia and hemiparesis following cerebral infarction affecting left non-dominant side: Secondary | ICD-10-CM

## 2021-09-21 DIAGNOSIS — R2681 Unsteadiness on feet: Secondary | ICD-10-CM

## 2021-09-21 NOTE — Therapy (Signed)
[facility] [address] [place], [place], [address] Phone: [phone]   Fax:  [phone]  Physical Therapy Treatment  Patient Details  Name: Ruben Chavez MRN: [id number] Date of Birth: [birth year] Referring Provider (PT): Leeann Must, MD [facility] MD)   Encounter Date: 09/21/2021   PT End of Session - 09/21/21 1018     Visit Number 3    Number of Visits 17    Date for PT Re-Evaluation 10/24/21    Authorization Type [employer]    Progress Note Due on Visit 10    PT Start Time 1015    PT Stop Time 1057    PT Time Calculation (min) 42 min    Activity Tolerance Patient tolerated treatment well    Behavior During Therapy [facility] for tasks assessed/performed             Past Medical History:  Diagnosis Date   Elevated PSA    Hyperlipidemia    pt says not anymore   Hypertension    Prostate cancer ([place]) 2013    Past Surgical History:  Procedure Laterality Date   bilateral inguinal hernia  2008   BILIARY BRUSHING  06/23/2021   Procedure: BILIARY BRUSHING;  Surgeon: Irving Copas., MD;  Location: [facility];  Service: Gastroenterology;;   BILIARY DILATION  06/23/2021   Procedure: BILIARY DILATION;  Surgeon: Irving Copas., MD;  Location: [facility];  Service: Gastroenterology;;   BILIARY STENT PLACEMENT N/A 06/23/2021   Procedure: BILIARY STENT PLACEMENT;  Surgeon: Irving Copas., MD;  Location: [facility];  Service: Gastroenterology;  Laterality: N/A;   BIOPSY  06/23/2021   Procedure: BIOPSY;  Surgeon: Rush Landmark Telford Nab., MD;  Location: [facility];  Service: Gastroenterology;;   Kathleen Argue STUDY  01/28/2020   Procedure: BUBBLE STUDY;  Surgeon: [name], MD;  Location: [facility];  Service: Cardiovascular;;   CYST REMOVAL TRUNK  2016   sebaceous cyst   CYSTOSCOPY  04/22/2016   Procedure: CYSTOSCOPY;  Surgeon: [name], MD;  Location: [facility];  Service: Urology;;   ERCP N/A 06/23/2021   Procedure: ENDOSCOPIC RETROGRADE CHOLANGIOPANCREATOGRAPHY (ERCP);  Surgeon: Irving Copas., MD;  Location: [facility];  Service: Gastroenterology;  Laterality: N/A;   IR ANGIO INTRA EXTRACRAN SEL COM CAROTID INNOMINATE BILAT MOD SED  05/30/2020   IR ANGIO VERTEBRAL SEL SUBCLAVIAN INNOMINATE UNI R MOD SED  01/25/2020   IR ANGIO VERTEBRAL SEL VERTEBRAL BILAT MOD SED  05/30/2020   IR CT HEAD LTD  01/25/2020   IR PERCUTANEOUS ART THROMBECTOMY/INFUSION INTRACRANIAL INC DIAG ANGIO  01/25/2020   IR US GUIDE VASC ACCESS RIGHT  05/30/2020   KNEE ARTHROSCOPY  2007   right   LOOP RECORDER INSERTION N/A 01/28/2020   Procedure: LOOP RECORDER INSERTION;  Surgeon: [name], MD;  Location: [facility];  Service: Cardiovascular;  Laterality: N/A;   PANCREATIC STENT PLACEMENT  06/23/2021   Procedure: PANCREATIC STENT PLACEMENT;  Surgeon: Irving Copas., MD;  Location: [facility];  Service: Gastroenterology;;   PROSTATE BIOPSY  2008   PROSTATE BIOPSY  2013   PROSTATE BIOPSY  01/28/2016   RADIOACTIVE SEED IMPLANT N/A 04/22/2016   Procedure: RADIOACTIVE SEED IMPLANT/BRACHYTHERAPY IMPLANT;  Surgeon: [name], MD;  Location: [facility];  Service: Urology;  Laterality: N/A;   RADIOLOGY WITH ANESTHESIA N/A 01/24/2020   Procedure: IR WITH ANESTHESIA;  Surgeon: [name], MD;  Location: [facility];  Service: Radiology;  Laterality: N/A;   REMOVAL OF STONES  06/23/2021   Procedure: REMOVAL OF STONES;  Surgeon: Rush Landmark Telford Nab., MD;  Location: [facility];  Service: Gastroenterology;;   Joan Mayans  06/23/2021   Procedure: Joan Mayans;  Surgeon: Rush Landmark Telford Nab., MD;  Location: [facility];  Service: Gastroenterology;;   TEE WITHOUT CARDIOVERSION N/A 01/28/2020   Procedure: TRANSESOPHAGEAL ECHOCARDIOGRAM (TEE);  Surgeon: [name], MD;  Location: [facility];  Service:  Cardiovascular;  Laterality: N/A;   TONSILLECTOMY     TOTAL HIP ARTHROPLASTY  2001   left   TOTAL HIP ARTHROPLASTY Right 01/18/2018   Procedure: RIGHT TOTAL HIP ARTHROPLASTY ANTERIOR APPROACH;  Surgeon: [name], MD;  Location: [facility];  Service: Orthopedics;  Laterality: Right;    There were no vitals filed for this visit.   Subjective Assessment - 09/21/21 1018     Subjective Pt reports that he is sore today. He reports that he was trying to move a dish off table and he dropped it. Was trying to pick it up and he fell. He tried to scoot over to his hospital bed to be able to pull up but could not get up. Was on the floor for 3 hours. He called his wife who was sleeping upstairs at that time and she came down to unlock the door so he could call fire department. Pt's daughter was also there from out of town and she was able to help him up. He only got 2.5 hours of sleep last night due to this.    Pertinent History history of CVA in 01/2020, recent dx of cholangiocarcinoma in July 2022 getting weekly chemo treatments    Limitations Walking;Standing    How long can you stand comfortably? 3-4 mins    How long can you walk comfortably? 5-10 mins    Patient Stated Goals "To not worry about being off balance."    Currently in Pain? No/denies                               [facility] Adult PT Treatment/Exercise - 09/21/21 1026       Transfers   Transfers Floor to Transfer    Floor to Transfer 4: Min assist    Floor to Transfer Details (indicate cue type and reason) Pt was cued to turn to right to come up on knees to be able to support with right arm better. Be sure to pull legs up to get them under him. Once on knees pt can use side of mat to place both arms on. Left elbow able to provide some support. Came to stand from there CGA.    Floor to Transfer Details Verbal cues for technique      Ambulation/Gait   Ambulation/Gait Yes    Ambulation/Gait Assistance 5: Supervision     Ambulation/Gait Assistance Details Pt was cued to try to increase step length and cadence with relaxing arms for more arm swing. HR=90 after gait    Ambulation Distance (Feet) 345 Feet    Assistive device None    Gait Pattern Step-through pattern;Decreased arm swing - left;Decreased step length - right;Decreased step length - left    Ambulation Surface Level;Indoor      Neuro Re-ed    Neuro Re-ed Details  Dynamic gait activities: reciprocal steps over 4 yard sticks and weaving in and out of 4 cones x 4 bouts then tapping each cone with each foot before stepping over x  2 bouts CGA.                     PT Education - 09/21/21 1331     Education Details PT discussed with pt that if he should fall again he should not wait 3 hours to notify family that he needs help.    Person(s) Educated Patient    Methods Explanation    Comprehension Verbalized understanding              PT Short Term Goals - 08/25/21 1226       PT SHORT TERM GOAL #1   Title Pt will be independent with initial HEP in order to build upon functional gains made in therapy. ALL STGS DUE 09/24/21    Time 4    Period Weeks    Status New    Target Date 09/24/21      PT SHORT TERM GOAL #2   Title Pt will improve FGA score to at least a 22/30 in order to indicate decr fall risk.    Baseline 18/30    Time 4    Period Weeks    Status New      PT SHORT TERM GOAL #3   Title Pt will ambulate at least 300' over level surfaces while scanning environment with supervision and no LOB.    Time 4    Period Weeks    Status New      PT SHORT TERM GOAL #4   Title Pt will decr 5x sit <> stand time to 11.5 seconds or less in order to indicate improved functional LE strength.    Baseline 14.75  secs    Time 4    Period Weeks    Status New      PT SHORT TERM GOAL #5   Title Pt will improve gait speed to at least 2.6 ft/sec in order to demo improved gait efficiency.    Baseline 2.00 ft/sec    Time 4    Period  Weeks    Status New               PT Long Term Goals - 08/25/21 1227       PT LONG TERM GOAL #1   Title Pt will be independent with final HEP in order to build upon functional gains made in therapy. ALL LTGS DUE 10/24/21    Time 8    Period Weeks    Status New    Target Date 10/24/21      PT LONG TERM GOAL #2   Title Pt will improve FGA score to at least a 26/30 in order to indicate decr fall risk.    Time 8    Period Weeks    Status New      PT LONG TERM GOAL #3   Title 30 second chair stand goal to be written as appropriate to demo improved LE strength and endurance.    Time 8    Period Weeks    Status New      PT LONG TERM GOAL #4   Title Pt will improve gait speed to at least 3.2 ft/sec in order to demo improved gait efficiency in the community.    Time 8    Period Weeks    Status New      PT LONG TERM GOAL #5   Title Pt will ambulate at least 500' over unlevel surfaces with mod I and scanning environment in order to improve  community mobility.    Time 8    Period Weeks    Status New                   Plan - 09/21/21 1332     Clinical Impression Statement PT worked on fall recover with floor transfer today as pt has been unable to get up after falls at home on his own. Pt able to perform min assist. Instructed to roll to right when coming up on knees so that his stronger arm can help more. Will need continued practice to assess carryover but pt was more fatigued today due to lack of sleep with fall.    Personal Factors and Comorbidities Age;Comorbidity 3+    Comorbidities see above    Examination-Activity Limitations Locomotion Level;Squat;Stairs;Stand    Examination-Participation Restrictions Community Activity;Driving    Stability/Clinical Decision Making Evolving/Moderate complexity    Rehab Potential Good    PT Frequency 2x / week    PT Duration 8 weeks    PT Treatment/Interventions ADLs/Self Care Home Management;Gait training;Stair  training;Functional mobility training;Therapeutic activities;Therapeutic exercise;Balance training;Neuromuscular re-education;Patient/family education;Vestibular    PT Next Visit Plan Check STGs next visit. Continue to work on floor transfers. balance on compliant surfaces,  gait training, nustep/scifit    Consulted and Agree with Plan of Care Patient             Patient will benefit from skilled therapeutic intervention in order to improve the following deficits and impairments:  Decreased activity tolerance, Decreased balance, Decreased endurance, Decreased mobility, Decreased strength, Difficulty walking, Impaired perceived functional ability, Postural dysfunction  Visit Diagnosis: Other abnormalities of gait and mobility  Muscle weakness (generalized)  Unsteadiness on feet     Problem List Patient Active Problem List   Diagnosis Date Noted   Cholangitis 07/28/2021   SIRS (systemic inflammatory response syndrome) ([place]) 07/10/2021   Obstructive jaundice due to malignant neoplasm ([place]) 06/24/2021   Klatskin's tumor ([place]) 06/24/2021   Malnutrition of moderate degree 06/24/2021   Abnormal abdominal CT scan 06/19/2021   Jaundice 06/19/2021   Weight loss, unintentional 06/19/2021   Bile duct obstruction 06/19/2021   Left hemiparesis ([place]) 03/28/2020   Chronic bilateral low back pain without sciatica 02/25/2020   Spasticity as late effect of cerebrovascular accident (CVA) 02/25/2020   Cognitive and neurobehavioral dysfunction    Hypertensive urgency 01/28/2020   Urinary retention 01/28/2020   Right middle cerebral artery stroke ([place]) 01/28/2020   Stroke (cerebrum) ([place]) -  [name] 01/25/2020   Acute respiratory failure with hypoxia (HCC)    Hypotension    Hypokalemia    Hypocalcemia    Encephalopathy acute    Stroke ([place]) 01/24/2020   Middle cerebral artery  embolism, right 01/24/2020   Hyperlipidemia 02/21/2019   Impingement syndrome of right shoulder region 02/21/2018   OA (osteoarthritis) of hip 01/18/2018   Prostate cancer ([place]) 03/04/2016   Essential hypertension 11/27/2014   History of colonic polyps 09/26/2014   BACK PAIN, LEFT 12/22/2009   PROSTATE SPECIFIC ANTIGEN, ELEVATED 12/20/2008   LATERAL EPICONDYLITIS 07/15/2008   ACTINIC KERATOSIS, FOREHEAD, LEFT 05/23/2008   WART, LEFT HAND 07/19/2007   ERECTILE DYSFUNCTION, MILD 07/19/2007   HERNIA, BILATERAL INGUINAL W/O OBST/GANGRENE 07/19/2007   MENISCUS TEAR 07/19/2007   Bilateral inguinal hernia 07/19/2007    Electa Sniff, PT, DPT, NCS 09/21/2021, 1:37 PM  [facility] [address] [place], [place], [address] Phone: [phone]   Fax:  [phone]  Name: Ruben Chavez MRN: [id number] Date of Birth: 30-Jul-1951

## 2021-09-21 NOTE — Therapy (Signed)
[facility] [address] [place], [place], [address] Phone: [phone]   Fax:  [phone]  Occupational Therapy Treatment  Patient Details  Name: Ruben Chavez MRN: [id number] Date of Birth: 01/02/1951 Referring Provider (OT): Dr. Cyndi Lennert   Encounter Date: 09/21/2021   OT End of Session - 09/21/21 1104     Visit Number 6    Number of Visits 17    Date for OT Re-Evaluation 10/25/21    Authorization Type [employer]    Authorization - Visit Number 6    Progress Note Due on Visit 10    OT Start Time 1100    OT Stop Time 1145    OT Time Calculation (min) 45 min    Activity Tolerance Patient tolerated treatment well    Behavior During Therapy [facility] for tasks assessed/performed             Past Medical History:  Diagnosis Date   Elevated PSA    Hyperlipidemia    pt says not anymore   Hypertension    Prostate cancer ([place]) 2013    Past Surgical History:  Procedure Laterality Date   bilateral inguinal hernia  2008   BILIARY BRUSHING  06/23/2021   Procedure: BILIARY BRUSHING;  Surgeon: Irving Copas., MD;  Location: [facility];  Service: Gastroenterology;;   BILIARY DILATION  06/23/2021   Procedure: BILIARY DILATION;  Surgeon: Irving Copas., MD;  Location: [facility];  Service: Gastroenterology;;   BILIARY STENT PLACEMENT N/A 06/23/2021   Procedure: BILIARY STENT PLACEMENT;  Surgeon: Irving Copas., MD;  Location: [facility];  Service: Gastroenterology;  Laterality: N/A;   BIOPSY  06/23/2021   Procedure: BIOPSY;  Surgeon: Rush Landmark Telford Nab., MD;  Location: [facility];  Service: Gastroenterology;;   Kathleen Argue STUDY  01/28/2020   Procedure: BUBBLE STUDY;  Surgeon: [name], MD;  Location: [facility];  Service: Cardiovascular;;   CYST REMOVAL TRUNK  2016   sebaceous cyst   CYSTOSCOPY  04/22/2016   Procedure: CYSTOSCOPY;  Surgeon: [name], MD;   Location: [facility];  Service: Urology;;   ERCP N/A 06/23/2021   Procedure: ENDOSCOPIC RETROGRADE CHOLANGIOPANCREATOGRAPHY (ERCP);  Surgeon: Irving Copas., MD;  Location: [facility];  Service: Gastroenterology;  Laterality: N/A;   IR ANGIO INTRA EXTRACRAN SEL COM CAROTID INNOMINATE BILAT MOD SED  05/30/2020   IR ANGIO VERTEBRAL SEL SUBCLAVIAN INNOMINATE UNI R MOD SED  01/25/2020   IR ANGIO VERTEBRAL SEL VERTEBRAL BILAT MOD SED  05/30/2020   IR CT HEAD LTD  01/25/2020   IR PERCUTANEOUS ART THROMBECTOMY/INFUSION INTRACRANIAL INC DIAG ANGIO  01/25/2020   IR US GUIDE VASC ACCESS RIGHT  05/30/2020   KNEE ARTHROSCOPY  2007   right   LOOP RECORDER INSERTION N/A 01/28/2020   Procedure: LOOP RECORDER INSERTION;  Surgeon: [name], MD;  Location: [facility];  Service: Cardiovascular;  Laterality: N/A;   PANCREATIC STENT PLACEMENT  06/23/2021   Procedure: PANCREATIC STENT PLACEMENT;  Surgeon: Irving Copas., MD;  Location[facility];  Service: Gastroenterology;;   PROSTATE BIOPSY  2008   PROSTATE BIOPSY  2013   PROSTATE BIOPSY  01/28/2016   RADIOACTIVE SEED IMPLANT N/A 04/22/2016   Procedure: RADIOACTIVE SEED IMPLANT/BRACHYTHERAPY IMPLANT;  Surgeon: [name], MD;  Location: [facility];  Service: Urology;  Laterality: N/A;   RADIOLOGY WITH ANESTHESIA N/A 01/24/2020   Procedure: IR WITH ANESTHESIA;  Surgeon: [name], MD;  Location: [facility];  Service: Radiology;  Laterality: N/A;   REMOVAL OF STONES  06/23/2021   Procedure: REMOVAL OF STONES;  Surgeon: Rush Landmark Telford Nab., MD;  Location: [facility];  Service: Gastroenterology;;   Joan Mayans  06/23/2021   Procedure: Joan Mayans;  Surgeon: Rush Landmark Telford Nab., MD;  [facility];  Service: Gastroenterology;;   TEE WITHOUT CARDIOVERSION N/A 01/28/2020   Procedure: TRANSESOPHAGEAL ECHOCARDIOGRAM (TEE);  Surgeon: [name], MD;  Location: [facility];   Service: Cardiovascular;  Laterality: N/A;   TONSILLECTOMY     TOTAL HIP ARTHROPLASTY  2001   left   TOTAL HIP ARTHROPLASTY Right 01/18/2018   Procedure: RIGHT TOTAL HIP ARTHROPLASTY ANTERIOR APPROACH;  Surgeon: [name], MD;  Location: [facility];  Service: Orthopedics;  Laterality: Right;    There were no vitals filed for this visit.   Subjective Assessment - 09/21/21 1102     Subjective  I had a fall last night    Pertinent History cholangiocarcinoma recently diagnosed 06/2021. PMH: s/p CVA w/ Lt hemiparesis 01/24/20, HTN, prostate CA 2017, Rt sh surgery 2020    Limitations active CA, Hernia, waste collection bag    Patient Stated Goals work on my balance and my arm function    Currently in Pain? No/denies            Pt had another fall - P.T. aware  Fabricated and fitted new CMC splint as pt lost previous one. Reviewed wear and care of CMC and resting hand splint. Pt reports resting hand splint going well at night.   Practiced use of rocker knife to cut "meat" and provided handout on where/how to purchase.                       OT Education - 09/21/21 1125     Education Details Splint wear and care, rocker knife info    Person(s) Educated Patient    Methods Explanation;Handout    Comprehension Verbalized understanding              OT Short Term Goals - 09/21/21 1207       OT SHORT TERM GOAL #1   Title Pt will be independent with revised/updated HEP    Time 4    Period Weeks    Status On-going      OT SHORT TERM GOAL #2   Title Will review splint wear and care and update splints prn    Time 4    Period Weeks    Status On-going      OT SHORT TERM GOAL #3   Title Pt to consistently cut food with rocker knife I'ly    Baseline pt admits to having wife do it mostly    Time 4    Period Weeks    Status On-going   09/21/21: practiced in clinic w/ min cues     OT SHORT TERM GOAL #4   Title Pt will be able to use LUE consistently as a  stabilizer for simple ADL tasks.    Time 4    Period Weeks    Status On-going               OT Long Term Goals - 08/26/21 9702       OT LONG TERM GOAL #1   Title Pt will improve LUE function by performing 8 on [employer] test    Baseline Lt = 2 (was 10 at d/c last year)    Time 8  Period Weeks    Status New      OT LONG TERM GOAL #2   Title Pt will consistently make simple snack w/ one handed techniques and A/E prn    Time 8    Period Weeks    Status New      OT LONG TERM GOAL #3   Title Pt will perform 15 min of physical activity w/o rest to increase endurance    Time 8    Period Weeks    Status New                   Plan - 09/21/21 1208     Clinical Impression Statement Pt progressing towards all STG's. Pt easily distracted and tangential    OT Occupational Profile and History Detailed Assessment- Review of Records and additional review of physical, cognitive, psychosocial history related to current functional performance    Occupational performance deficits (Please refer to evaluation for details): IADL's;Leisure;Social Participation    Body Structure / Function / Physical Skills Strength;Balance;Dexterity;Body mechanics;UE functional use;ROM;IADL;Endurance;Coordination;Mobility;FMC    Cognitive Skills Attention    Comorbidities Affecting Occupational Performance: Presence of comorbidities impacting occupational performance    Comorbidities impacting occupational performance description: residual Lt hemiparesis from stroke, active CA    Modification or Assistance to Complete Evaluation  No modification of tasks or assist necessary to complete eval    OT Frequency 2x / week    OT Duration 8 weeks   anticipate only 6 weeks   OT Treatment/Interventions Self-care/ADL training;Moist Heat;Fluidtherapy;DME and/or AE instruction;Therapeutic activities;Splinting;Coping strategies training;Cognitive remediation/compensation;Therapeutic exercise;Neuromuscular  education;Cryotherapy;Functional Mobility Training;Passive range of motion;Visual/perceptual remediation/compensation;Patient/family education;Manual Therapy;Electrical Stimulation;Energy conservation;Paraffin;Ultrasound    Plan assess thumb splint and review HEP for neuro re-educ then assess STG's #1-2, work on wrist ext, if time UBE w/ hand wrapped    Consulted and Agree with Plan of Care Patient             Patient will benefit from skilled therapeutic intervention in order to improve the following deficits and impairments:   Body Structure / Function / Physical Skills: Strength, Balance, Dexterity, Body mechanics, UE functional use, ROM, IADL, Endurance, Coordination, Mobility, [facility] Cognitive Skills: Attention     Visit Diagnosis: Hemiplegia and hemiparesis following cerebral infarction affecting left non-dominant side (HCC)  Attention and concentration deficit    Problem List Patient Active Problem List   Diagnosis Date Noted   Cholangitis 07/28/2021   SIRS (systemic inflammatory response syndrome) ([place]) 07/10/2021   Obstructive jaundice due to malignant neoplasm ([place]) 06/24/2021   Klatskin's tumor ([place]) 06/24/2021   Malnutrition of moderate degree 06/24/2021   Abnormal abdominal CT scan 06/19/2021   Jaundice 06/19/2021   Weight loss, unintentional 06/19/2021   Bile duct obstruction 06/19/2021   Left hemiparesis ([place]) 03/28/2020   Chronic bilateral low back pain without sciatica 02/25/2020   Spasticity as late effect of cerebrovascular accident (CVA) 02/25/2020   Cognitive and neurobehavioral dysfunction    Hypertensive urgency 01/28/2020   Urinary retention 01/28/2020   Right middle cerebral artery stroke ([place]) 01/28/2020   Stroke (cerebrum) ([place]) -  [name] 01/25/2020   Acute respiratory failure with hypoxia (HCC)    Hypotension    Hypokalemia     Hypocalcemia    Encephalopathy acute    Stroke ([place]) 01/24/2020   Middle cerebral artery embolism, right 01/24/2020   Hyperlipidemia 02/21/2019   Impingement  syndrome of right shoulder region 02/21/2018   OA (osteoarthritis) of hip 01/18/2018   Prostate cancer ([place]) 03/04/2016   Essential hypertension 11/27/2014   History of colonic polyps 09/26/2014   BACK PAIN, LEFT 12/22/2009   PROSTATE SPECIFIC ANTIGEN, ELEVATED 12/20/2008   LATERAL EPICONDYLITIS 07/15/2008   ACTINIC KERATOSIS, FOREHEAD, LEFT 05/23/2008   WART, LEFT HAND 07/19/2007   ERECTILE DYSFUNCTION, MILD 07/19/2007   HERNIA, BILATERAL INGUINAL W/O OBST/GANGRENE 07/19/2007   MENISCUS TEAR 07/19/2007   Bilateral inguinal hernia 07/19/2007    Carey Bullocks, OTR/L 09/21/2021, 12:11 PM  [facility] [address] [place], [place], [address] Phone: [phone]   Fax:  [phone]  Name: Ruben Chavez MRN: [id number] Date of Birth: [birth year]

## 2021-09-21 NOTE — Patient Instructions (Signed)
  SPLINT WEAR AND CARE:   Thumb splint is to wear during the day  Resting hand splint is to wear at night  Keep splints away from heat sources like stove, furnace, or car on a hot day. Keep splint away from pets and children.   Clean splint with rubbing alcohol 1-2x/day. Make sure your hand/forearm is clean and fully dry before applying splint.   Monitor your skin for any red spots or pressure areas that do not fade. If a lingering reddened area occurs, please take off and bring to next appointment for adjustments

## 2021-09-23 ENCOUNTER — Ambulatory Visit: Payer: PPO | Admitting: Rehabilitation

## 2021-09-23 ENCOUNTER — Ambulatory Visit: Payer: PPO | Admitting: Occupational Therapy

## 2021-09-23 ENCOUNTER — Other Ambulatory Visit: Payer: Self-pay

## 2021-09-23 DIAGNOSIS — I69354 Hemiplegia and hemiparesis following cerebral infarction affecting left non-dominant side: Secondary | ICD-10-CM

## 2021-09-23 DIAGNOSIS — R2689 Other abnormalities of gait and mobility: Secondary | ICD-10-CM

## 2021-09-23 DIAGNOSIS — R278 Other lack of coordination: Secondary | ICD-10-CM

## 2021-09-23 DIAGNOSIS — R414 Neurologic neglect syndrome: Secondary | ICD-10-CM

## 2021-09-23 DIAGNOSIS — R41840 Attention and concentration deficit: Secondary | ICD-10-CM

## 2021-09-23 DIAGNOSIS — R2681 Unsteadiness on feet: Secondary | ICD-10-CM

## 2021-09-23 DIAGNOSIS — M6281 Muscle weakness (generalized): Secondary | ICD-10-CM

## 2021-09-23 NOTE — Therapy (Signed)
[facility] [address] [place], [place], [address] Phone: [phone]   Fax:  [phone]  Physical Therapy Treatment  Patient Details  Name: Ruben Chavez MRN: [id number] Date of Birth: Mar 29, 1951 Referring Provider (PT): Leeann Must, MD [facility] MD)   Encounter Date: 09/23/2021   PT End of Session - 09/23/21 1357     Visit Number 4    Number of Visits 17    Date for PT Re-Evaluation 10/24/21    Authorization Type [employer]    Progress Note Due on Visit 10    PT Start Time 1147    PT Stop Time 1232    PT Time Calculation (min) 45 min    Activity Tolerance Patient tolerated treatment well    Behavior During Therapy [facility] for tasks assessed/performed             Past Medical History:  Diagnosis Date   Elevated PSA    Hyperlipidemia    pt says not anymore   Hypertension    Prostate cancer ([place]) 2013    Past Surgical History:  Procedure Laterality Date   bilateral inguinal hernia  2008   BILIARY BRUSHING  06/23/2021   Procedure: BILIARY BRUSHING;  Surgeon: Irving Copas., MD;  Location: [facility];  Service: Gastroenterology;;   BILIARY DILATION  06/23/2021   Procedure: BILIARY DILATION;  Surgeon: Irving Copas., MD;  Location: [facility];  Service: Gastroenterology;;   BILIARY STENT PLACEMENT N/A 06/23/2021   Procedure: BILIARY STENT PLACEMENT;  Surgeon: Irving Copas., MD;  Location: [facility];  Service: Gastroenterology;  Laterality: N/A;   BIOPSY  06/23/2021   Procedure: BIOPSY;  Surgeon: Rush Landmark Telford Nab., MD;  Location: [facility];  Service: Gastroenterology;;   Kathleen Argue STUDY  01/28/2020   Procedure: BUBBLE STUDY;  Surgeon: [name], MD;  Location: [facility];  Service: Cardiovascular;;   CYST REMOVAL TRUNK  2016   sebaceous cyst   CYSTOSCOPY  04/22/2016   Procedure: CYSTOSCOPY;  Surgeon: [name], MD;  Location: [facility];  Service: Urology;;   ERCP N/A 06/23/2021   Procedure: ENDOSCOPIC RETROGRADE CHOLANGIOPANCREATOGRAPHY (ERCP);  Surgeon: Irving Copas., MD;  Location: [facility];  Service: Gastroenterology;  Laterality: N/A;   IR ANGIO INTRA EXTRACRAN SEL COM CAROTID INNOMINATE BILAT MOD SED  05/30/2020   IR ANGIO VERTEBRAL SEL SUBCLAVIAN INNOMINATE UNI R MOD SED  01/25/2020   IR ANGIO VERTEBRAL SEL VERTEBRAL BILAT MOD SED  05/30/2020   IR CT HEAD LTD  01/25/2020   IR PERCUTANEOUS ART THROMBECTOMY/INFUSION INTRACRANIAL INC DIAG ANGIO  01/25/2020   IR US GUIDE VASC ACCESS RIGHT  05/30/2020   KNEE ARTHROSCOPY  2007   right   LOOP RECORDER INSERTION N/A 01/28/2020   Procedure: LOOP RECORDER INSERTION;  Surgeon: [name], MD;  Location: [facility];  Service: Cardiovascular;  Laterality: N/A;   PANCREATIC STENT PLACEMENT  06/23/2021   Procedure: PANCREATIC STENT PLACEMENT;  Surgeon: Irving Copas., MD;  Location: [facility];  Service: Gastroenterology;;   PROSTATE BIOPSY  2008   PROSTATE BIOPSY  2013   PROSTATE BIOPSY  01/28/2016   RADIOACTIVE SEED IMPLANT N/A 04/22/2016   Procedure: RADIOACTIVE SEED IMPLANT/BRACHYTHERAPY IMPLANT;  Surgeon: [name], MD;  Location: [facility];  Service: Urology;  Laterality: N/A;   RADIOLOGY WITH ANESTHESIA N/A 01/24/2020   Procedure: IR WITH ANESTHESIA;  Surgeon: [name], MD;  Location: [facility];  Service: Radiology;  Laterality: N/A;   REMOVAL OF STONES  06/23/2021   Procedure: REMOVAL OF STONES;  Surgeon: Rush Landmark Telford Nab., MD;  Location: [facility];  Service: Gastroenterology;;   Joan Mayans  06/23/2021   Procedure: Joan Mayans;  Surgeon: Rush Landmark Telford Nab., MD;  Location: [facility];  Service: Gastroenterology;;   TEE WITHOUT CARDIOVERSION N/A 01/28/2020   Procedure: TRANSESOPHAGEAL ECHOCARDIOGRAM (TEE);  Surgeon: [name], MD;  Location: [facility];  Service:  Cardiovascular;  Laterality: N/A;   TONSILLECTOMY     TOTAL HIP ARTHROPLASTY  2001   left   TOTAL HIP ARTHROPLASTY Right 01/18/2018   Procedure: RIGHT TOTAL HIP ARTHROPLASTY ANTERIOR APPROACH;  Surgeon: [name], MD;  Location: [facility];  Service: Orthopedics;  Laterality: Right;    There were no vitals filed for this visit.       [facility] PT Assessment - 09/23/21 1214       Transfers   Transfers Floor to Transfer    Five time sit to stand comments  13.53 secs without UE support    Floor to Transfer 5: Supervision;4: Min assist    Floor to Transfer Details (indicate cue type and reason) Performed x 2 reps with pt turning R as in previous session.  Pt needed max cues and demo today to recall what previous PT had done, but he seemed to do a little better, needing min A initially then more min/guard to close S.  Cues to not stand all the way up, but just enough to sit on surface to avoid any dizziness.    Floor to Transfer Details Verbal cues for technique      Ambulation/Gait   Ambulation/Gait Yes    Ambulation/Gait Assistance 5: Supervision    Ambulation/Gait Assistance Details Pt ambulated around therapy gym scanning evironment (cued by PT) x 300' at S level with min cues for posture and improved arm swing throughout.  No LOB noted    Ambulation Distance (Feet) 300 Feet    Assistive device None    Gait Pattern Step-through pattern;Decreased arm swing - left;Decreased step length - right;Decreased step length - left    Ambulation Surface Level;Indoor    Gait velocity 2.75 ft/sec without device      Functional Gait  Assessment   Gait assessed  Yes    Gait Level Surface Walks 20 ft, slow speed, abnormal gait pattern, evidence for imbalance or deviates 10-15 in outside of the 12 in walkway width. Requires more than 7 sec to ambulate 20 ft.   8.18   Change in Gait Speed Able to smoothly change walking speed without loss of balance or gait deviation. Deviate no more than 6 in outside of  the 12 in walkway width.    Gait with Horizontal Head Turns Performs head turns smoothly with slight change in gait velocity (eg, minor disruption to smooth gait path), deviates 6-10 in outside 12 in walkway width, or uses an assistive device.    Gait with Vertical Head Turns Performs head turns with no change in gait. Deviates no more than 6 in outside 12 in walkway width.    Gait and Pivot Turn Pivot turns safely within 3 sec and stops quickly with no loss of balance.    Step Over Obstacle Is able to step over one shoe box (4.5 in total height) without changing gait speed. No evidence of imbalance.    Gait with Narrow Base of Support Ambulates 4-7 steps.    Gait with Eyes Closed Walks 20 ft, uses assistive device,  slower speed, mild gait deviations, deviates 6-10 in outside 12 in walkway width. Ambulates 20 ft in less than 9 sec but greater than 7 sec.    Ambulating Backwards Walks 20 ft, uses assistive device, slower speed, mild gait deviations, deviates 6-10 in outside 12 in walkway width.    Steps Alternating feet, must use rail.    Total Score 21                                    PT Education - 09/23/21 1356     Education Details Educated on progress towards/meeting STGs and POC    Person(s) Educated Patient    Methods Explanation    Comprehension Verbalized understanding              PT Short Term Goals - 09/23/21 1156       PT SHORT TERM GOAL #1   Title Pt will be independent with initial HEP in order to build upon functional gains made in therapy. ALL STGS DUE 09/24/21    Baseline Pt reports doing them most days at home    Time 4    Period Weeks    Status Achieved    Target Date 09/24/21      PT SHORT TERM GOAL #2   Title Pt will improve FGA score to at least a 22/30 in order to indicate decr fall risk.    Baseline 18/30 to 21/30    Time 4    Period Weeks    Status Partially Met      PT SHORT TERM GOAL #3   Title Pt will ambulate at  least 300' over level surfaces while scanning environment with supervision and no LOB.    Baseline met    Time 4    Period Weeks    Status Achieved      PT SHORT TERM GOAL #4   Title Pt will decr 5x sit <> stand time to 11.5 seconds or less in order to indicate improved functional LE strength.    Baseline 14.75  secs to 13.53 secs without support    Time 4    Period Weeks    Status Partially Met      PT SHORT TERM GOAL #5   Title Pt will improve gait speed to at least 2.6 ft/sec in order to demo improved gait efficiency.    Baseline 2.00 ft/sec to 2.75 ft/sec    Time 4    Period Weeks    Status Achieved               PT Long Term Goals - 08/25/21 1227       PT LONG TERM GOAL #1   Title Pt will be independent with final HEP in order to build upon functional gains made in therapy. ALL LTGS DUE 10/24/21    Time 8    Period Weeks    Status New    Target Date 10/24/21      PT LONG TERM GOAL #2   Title Pt will improve FGA score to at least a 26/30 in order to indicate decr fall risk.    Time 8    Period Weeks    Status New      PT LONG TERM GOAL #3   Title 30 second chair stand goal to be written as appropriate to demo improved LE strength and endurance.    Time 8  Period Weeks    Status New      PT LONG TERM GOAL #4   Title Pt will improve gait speed to at least 3.2 ft/sec in order to demo improved gait efficiency in the community.    Time 8    Period Weeks    Status New      PT LONG TERM GOAL #5   Title Pt will ambulate at least 500' over unlevel surfaces with mod I and scanning environment in order to improve community mobility.    Time 8    Period Weeks    Status New                   Plan - 09/23/21 1357     Clinical Impression Statement Pt has met 3/5 STGs, making progress towards remaining 2 goals for FGA and 5TSS test.  He has done well increasing gait speed to community ambulator level however still has higher balance deficits and poor  posture.  Will continue to benefit from skilled PT to address remaining deficits.    Personal Factors and Comorbidities Age;Comorbidity 3+    Comorbidities see above    Examination-Activity Limitations Locomotion Level;Squat;Stairs;Stand    Examination-Participation Restrictions Community Activity;Driving    Stability/Clinical Decision Making Evolving/Moderate complexity    Rehab Potential Good    PT Frequency 2x / week    PT Duration 8 weeks    PT Treatment/Interventions ADLs/Self Care Home Management;Gait training;Stair training;Functional mobility training;Therapeutic activities;Therapeutic exercise;Balance training;Neuromuscular re-education;Patient/family education;Vestibular    PT Next Visit Plan Continue to work on floor transfers. balance on compliant surfaces,  gait training, nustep/scifit    Consulted and Agree with Plan of Care Patient             Patient will benefit from skilled therapeutic intervention in order to improve the following deficits and impairments:  Decreased activity tolerance, Decreased balance, Decreased endurance, Decreased mobility, Decreased strength, Difficulty walking, Impaired perceived functional ability, Postural dysfunction  Visit Diagnosis: Other abnormalities of gait and mobility  Unsteadiness on feet  Muscle weakness (generalized)     Problem List Patient Active Problem List   Diagnosis Date Noted   Cholangitis 07/28/2021   SIRS (systemic inflammatory response syndrome) ([place]) 07/10/2021   Obstructive jaundice due to malignant neoplasm ([place]) 06/24/2021   Klatskin's tumor ([place]) 06/24/2021   Malnutrition of moderate degree 06/24/2021   Abnormal abdominal CT scan 06/19/2021   Jaundice 06/19/2021   Weight loss, unintentional 06/19/2021   Bile duct obstruction 06/19/2021   Left hemiparesis ([place]) 03/28/2020   Chronic bilateral low back pain without sciatica 02/25/2020   Spasticity as late effect of cerebrovascular accident (CVA)  02/25/2020   Cognitive and neurobehavioral dysfunction    Hypertensive urgency 01/28/2020   Urinary retention 01/28/2020   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   Stroke (cerebrum) ([place]) -  [name] 01/25/2020   Acute respiratory failure with hypoxia (HCC)    Hypotension    Hypokalemia    Hypocalcemia    Encephalopathy acute    Stroke ([place]) 01/24/2020   Middle cerebral artery embolism, right 01/24/2020   Hyperlipidemia 02/21/2019   Impingement syndrome of right shoulder region 02/21/2018   OA (osteoarthritis) of hip 01/18/2018   Prostate cancer ([place]) 03/04/2016   Essential hypertension 11/27/2014   History of colonic polyps 09/26/2014   BACK PAIN, LEFT 12/22/2009   PROSTATE SPECIFIC  ANTIGEN, ELEVATED 12/20/2008   LATERAL EPICONDYLITIS 07/15/2008   ACTINIC KERATOSIS, FOREHEAD, LEFT 05/23/2008   WART, LEFT HAND 07/19/2007   ERECTILE DYSFUNCTION, MILD 07/19/2007   HERNIA, BILATERAL INGUINAL W/O OBST/GANGRENE 07/19/2007   MENISCUS TEAR 07/19/2007   Bilateral inguinal hernia 07/19/2007    Cameron Sprang, PT, MPT [facility] [address] [place], [place], [address] Phone: [phone]   Fax:  [phone] 09/23/21, 2:00 PM   Name: FRANTZ QUATTRONE MRN: [id number] Date of Birth: 1951/09/05

## 2021-09-23 NOTE — Therapy (Addendum)
[facility] [address] [place], [place], [address] Phone: [phone]   Fax:  [phone]  Occupational Therapy Treatment  Patient Details  Name: Ruben Chavez MRN: [id number] Date of Birth: Nov 25, 1951 Referring Provider (OT): Dr. Cyndi Lennert   Encounter Date: 09/23/2021   OT End of Session - 09/23/21 1116     Visit Number 7    Number of Visits 17    Date for OT Re-Evaluation 10/25/21    Authorization Type [employer]    Authorization - Visit Number 7    Progress Note Due on Visit 10    OT Start Time 1115    OT Stop Time 1145    OT Time Calculation (min) 30 min             Past Medical History:  Diagnosis Date   Elevated PSA    Hyperlipidemia    pt says not anymore   Hypertension    Prostate cancer ([place]) 2013    Past Surgical History:  Procedure Laterality Date   bilateral inguinal hernia  2008   BILIARY BRUSHING  06/23/2021   Procedure: BILIARY BRUSHING;  Surgeon: Irving Copas., MD;  Location: [facility];  Service: Gastroenterology;;   BILIARY DILATION  06/23/2021   Procedure: BILIARY DILATION;  Surgeon: Irving Copas., MD;  Location: [facility];  Service: Gastroenterology;;   BILIARY STENT PLACEMENT N/A 06/23/2021   Procedure: BILIARY STENT PLACEMENT;  Surgeon: Irving Copas., MD;  Location: [facility];  Service: Gastroenterology;  Laterality: N/A;   BIOPSY  06/23/2021   Procedure: BIOPSY;  Surgeon: Rush Landmark Telford Nab., MD;  Location: [facility];  Service: Gastroenterology;;   Kathleen Argue STUDY  01/28/2020   Procedure: BUBBLE STUDY;  Surgeon: [name], MD;  Location: [facility];  Service: Cardiovascular;;   CYST REMOVAL TRUNK  2016   sebaceous cyst   CYSTOSCOPY  04/22/2016   Procedure: CYSTOSCOPY;  Surgeon: [name], MD;  Location: [facility];  Service: Urology;;   ERCP N/A 06/23/2021   Procedure: ENDOSCOPIC RETROGRADE  CHOLANGIOPANCREATOGRAPHY (ERCP);  Surgeon: Irving Copas., MD;  Location: [facility];  Service: Gastroenterology;  Laterality: N/A;   IR ANGIO INTRA EXTRACRAN SEL COM CAROTID INNOMINATE BILAT MOD SED  05/30/2020   IR ANGIO VERTEBRAL SEL SUBCLAVIAN INNOMINATE UNI R MOD SED  01/25/2020   IR ANGIO VERTEBRAL SEL VERTEBRAL BILAT MOD SED  05/30/2020   IR CT HEAD LTD  01/25/2020   IR PERCUTANEOUS ART THROMBECTOMY/INFUSION INTRACRANIAL INC DIAG ANGIO  01/25/2020   IR US GUIDE VASC ACCESS RIGHT  05/30/2020   KNEE ARTHROSCOPY  2007   right   LOOP RECORDER INSERTION N/A 01/28/2020   Procedure: LOOP RECORDER INSERTION;  Surgeon: [name], MD;  Location: [facility];  Service: Cardiovascular;  Laterality: N/A;   PANCREATIC STENT PLACEMENT  06/23/2021   Procedure: PANCREATIC STENT PLACEMENT;  Surgeon: Irving Copas., MD;  Location[facility];  Service: Gastroenterology;;   PROSTATE BIOPSY  2008   PROSTATE BIOPSY  2013   PROSTATE BIOPSY  01/28/2016   RADIOACTIVE SEED IMPLANT N/A 04/22/2016   Procedure: RADIOACTIVE SEED IMPLANT/BRACHYTHERAPY IMPLANT;  Surgeon: [name], MD;  Location[facility];  Service: Urology;  Laterality: N/A;   RADIOLOGY WITH ANESTHESIA N/A 01/24/2020   Procedure: IR WITH ANESTHESIA;  Surgeon: [name], MD;  Location: [facility];  Service: Radiology;  Laterality: N/A;   REMOVAL OF STONES  06/23/2021   Procedure: REMOVAL OF STONES;  Surgeon: Irving Copas., MD;  Location: [facility];  Service: Gastroenterology;;   Joan Mayans  06/23/2021   Procedure: Joan Mayans;  Surgeon: Rush Landmark Telford Nab., MD;  Location: [facility];  Service: Gastroenterology;;   TEE WITHOUT CARDIOVERSION N/A 01/28/2020   Procedure: TRANSESOPHAGEAL ECHOCARDIOGRAM (TEE);  Surgeon: [name], MD;  Location: [facility];  Service: Cardiovascular;  Laterality: N/A;   TONSILLECTOMY     TOTAL HIP ARTHROPLASTY  2001   left   TOTAL HIP  ARTHROPLASTY Right 01/18/2018   Procedure: RIGHT TOTAL HIP ARTHROPLASTY ANTERIOR APPROACH;  Surgeon: [name], MD;  Location: [facility];  Service: Orthopedics;  Laterality: Right;    There were no vitals filed for this visit.     Pain; Pt denies pain Treatment: splint check performed, short opponens splint is fitting well without issues. Blotchiness on dorsal hand present but pt reports these are a reaction to meds and were present prior to application of splint. Pt attributes to a medication reaction.                    OT Education - 09/23/21 1155     Education Details reviewed supine shoulder flexion with paper towel roll, added weightbearing exercises to HEP- see pt instructions, mod v.c and demonstration required    Person(s) Educated Patient    Methods Explanation;Handout;Demonstration;Verbal cues;Tactile cues    Comprehension Verbalized understanding;Returned demonstration;Verbal cues required;Need further instruction              OT Short Term Goals - 09/23/21 1158       OT SHORT TERM GOAL #1   Title Pt will be independent with revised/updated HEP    Time 4    Period Weeks    Status On-going   needs reinforcement 09/23/21     OT SHORT TERM GOAL #2   Title Will review splint wear and care and update splints prn    Time 4    Period Weeks    Status On-going   daytime splint appears to be fitting well without issues 09/23/21     OT SHORT TERM GOAL #3   Title Pt to consistently cut food with rocker knife I'ly    Baseline pt admits to having wife do it mostly    Time 4    Period Weeks    Status On-going   09/21/21: practiced in clinic w/ min cues     OT SHORT TERM GOAL #4   Title Pt will be able to use LUE consistently as a stabilizer for simple ADL tasks.    Time 4    Period Weeks    Status On-going               OT Long Term Goals - 08/26/21 1700       OT LONG TERM GOAL #1   Title Pt will improve LUE function by performing 8 on  [employer] test    Baseline Lt = 2 (was 10 at d/c last year)    Time 8    Period Weeks    Status New      OT LONG TERM GOAL #2   Title Pt will consistently make simple snack w/ one handed techniques and A/E prn    Time 8    Period Weeks    Status New      OT LONG TERM GOAL #3   Title Pt will perform 15 min of physical activity w/o rest to increase endurance  Time 8    Period Weeks    Status New                   Plan - 09/23/21 1157     Clinical Impression Statement Pt progressing towards goals. he is limited by cognitve deficits.    OT Occupational Profile and History Detailed Assessment- Review of Records and additional review of physical, cognitive, psychosocial history related to current functional performance    Occupational performance deficits (Please refer to evaluation for details): IADL's;Leisure;Social Participation    Body Structure / Function / Physical Skills Strength;Balance;Dexterity;Body mechanics;UE functional use;ROM;IADL;Endurance;Coordination;Mobility;FMC    Cognitive Skills Attention    Comorbidities Affecting Occupational Performance: Presence of comorbidities impacting occupational performance    Comorbidities impacting occupational performance description: residual Lt hemiparesis from stroke, active CA    Modification or Assistance to Complete Evaluation  No modification of tasks or assist necessary to complete eval    OT Frequency 2x / week    OT Duration 8 weeks   anticipate only 6 weeks   OT Treatment/Interventions Self-care/ADL training;Moist Heat;Fluidtherapy;DME and/or AE instruction;Therapeutic activities;Splinting;Coping strategies training;Cognitive remediation/compensation;Therapeutic exercise;Neuromuscular education;Cryotherapy;Functional Mobility Training;Passive range of motion;Visual/perceptual remediation/compensation;Patient/family education;Manual Therapy;Electrical Stimulation;Energy conservation;Paraffin;Ultrasound    Plan  review updated  HEP for neuro re-educ then assess STG's #1-2, work on wrist ext, it time UBE w/ hand wrapped    Consulted and Agree with Plan of Care Patient             Patient will benefit from skilled therapeutic intervention in order to improve the following deficits and impairments:   Body Structure / Function / Physical Skills: Strength, Balance, Dexterity, Body mechanics, UE functional use, ROM, IADL, Endurance, Coordination, Mobility, [facility] Cognitive Skills: Attention     Visit Diagnosis: Hemiplegia and hemiparesis following cerebral infarction affecting left non-dominant side (HCC)  Attention and concentration deficit  Muscle weakness (generalized)  Unsteadiness on feet  Other lack of coordination  Neurologic neglect syndrome    Problem List Patient Active Problem List   Diagnosis Date Noted   Cholangitis 07/28/2021   SIRS (systemic inflammatory response syndrome) ([place]) 07/10/2021   Obstructive jaundice due to malignant neoplasm ([place]) 06/24/2021   Klatskin's tumor ([place]) 06/24/2021   Malnutrition of moderate degree 06/24/2021   Abnormal abdominal CT scan 06/19/2021   Jaundice 06/19/2021   Weight loss, unintentional 06/19/2021   Bile duct obstruction 06/19/2021   Left hemiparesis ([place]) 03/28/2020   Chronic bilateral low back pain without sciatica 02/25/2020   Spasticity as late effect of cerebrovascular accident (CVA) 02/25/2020   Cognitive and neurobehavioral dysfunction    Hypertensive urgency 01/28/2020   Urinary retention 01/28/2020   Right middle cerebral artery stroke ([place]) 01/28/2020   Stroke (cerebrum) (HCC) -  [name] 01/25/2020   Acute respiratory failure with hypoxia (HCC)    Hypotension    Hypokalemia    Hypocalcemia    Encephalopathy acute    Stroke ([place]) 01/24/2020   Middle cerebral artery embolism, right 01/24/2020   Hyperlipidemia  02/21/2019   Impingement syndrome of right shoulder region 02/21/2018   OA (osteoarthritis) of hip 01/18/2018   Prostate cancer ([place]) 03/04/2016   Essential hypertension 11/27/2014   History of colonic polyps 09/26/2014   BACK PAIN, LEFT 12/22/2009   PROSTATE SPECIFIC ANTIGEN, ELEVATED 12/20/2008   LATERAL EPICONDYLITIS 07/15/2008   ACTINIC KERATOSIS, FOREHEAD, LEFT 05/23/2008   WART, LEFT HAND 07/19/2007  ERECTILE DYSFUNCTION, MILD 07/19/2007   HERNIA, BILATERAL INGUINAL W/O OBST/GANGRENE 07/19/2007   MENISCUS TEAR 07/19/2007   Bilateral inguinal hernia 07/19/2007    Wilbern Pennypacker, OT/L 09/23/2021, 12:00 PM  [facility] [address] [place], [place], [address] Phone: [phone]   Fax:  [phone]  Name: JOAKIM HUESMAN MRN: [id number] Date of Birth: Jun 14, 1951

## 2021-09-23 NOTE — Patient Instructions (Signed)
Lateral Weight Shift: Upper Trunk Leading   Sit with feet flat on floor. Bring ____ shoulder, head and arm toward side until forearm/elbow just touches sitting surface. Hold __10__ seconds. Return to upright position by pushing through arm Repeat _10___ times per session. Do __2__ sessions per day.    [email or web address]   SITTING: Forward Weight Shift   Sit upright, place both hands on sitting surface. Lean chest forward, keep back straight. Hold _5__ seconds. _10__ reps per set, _2__ sets per day.  Place wedge under hand if wrist range of motion is limited.  Marland Kitchen

## 2021-09-25 DIAGNOSIS — Z5111 Encounter for antineoplastic chemotherapy: Secondary | ICD-10-CM | POA: Diagnosis not present

## 2021-09-25 DIAGNOSIS — Z452 Encounter for adjustment and management of vascular access device: Secondary | ICD-10-CM | POA: Diagnosis not present

## 2021-09-25 DIAGNOSIS — C221 Intrahepatic bile duct carcinoma: Secondary | ICD-10-CM | POA: Diagnosis not present

## 2021-09-26 DIAGNOSIS — G8194 Hemiplegia, unspecified affecting left nondominant side: Secondary | ICD-10-CM | POA: Diagnosis not present

## 2021-09-26 DIAGNOSIS — C801 Malignant (primary) neoplasm, unspecified: Secondary | ICD-10-CM | POA: Diagnosis not present

## 2021-09-28 DIAGNOSIS — Z4682 Encounter for fitting and adjustment of non-vascular catheter: Secondary | ICD-10-CM | POA: Diagnosis not present

## 2021-09-28 DIAGNOSIS — C221 Intrahepatic bile duct carcinoma: Secondary | ICD-10-CM | POA: Diagnosis not present

## 2021-09-29 ENCOUNTER — Ambulatory Visit: Payer: PPO | Admitting: Rehabilitation

## 2021-09-29 ENCOUNTER — Encounter: Payer: Self-pay | Admitting: Rehabilitation

## 2021-09-29 ENCOUNTER — Ambulatory Visit: Payer: PPO | Admitting: Occupational Therapy

## 2021-09-29 ENCOUNTER — Other Ambulatory Visit: Payer: Self-pay

## 2021-09-29 DIAGNOSIS — R2681 Unsteadiness on feet: Secondary | ICD-10-CM

## 2021-09-29 DIAGNOSIS — I69354 Hemiplegia and hemiparesis following cerebral infarction affecting left non-dominant side: Secondary | ICD-10-CM | POA: Diagnosis not present

## 2021-09-29 DIAGNOSIS — R41840 Attention and concentration deficit: Secondary | ICD-10-CM

## 2021-09-29 DIAGNOSIS — R414 Neurologic neglect syndrome: Secondary | ICD-10-CM

## 2021-09-29 DIAGNOSIS — M6281 Muscle weakness (generalized): Secondary | ICD-10-CM

## 2021-09-29 DIAGNOSIS — R2689 Other abnormalities of gait and mobility: Secondary | ICD-10-CM

## 2021-09-29 NOTE — Therapy (Signed)
[facility] [address] [place], [place], [address] Phone: [phone]   Fax:  [phone]  Physical Therapy Treatment  Patient Details  Name: Ruben Chavez MRN: [id number] Date of Birth: Jul 05, 1951 Referring Provider (PT): Leeann Must, MD [facility] MD)   Encounter Date: 09/29/2021   PT End of Session - 09/29/21 1356     Visit Number 5    Number of Visits 17    Date for PT Re-Evaluation 10/24/21    Authorization Type [employer]    Progress Note Due on Visit 10    PT Start Time 1104    PT Stop Time 1145    PT Time Calculation (min) 41 min    Activity Tolerance Patient tolerated treatment well    Behavior During Therapy [facility] for tasks assessed/performed             Past Medical History:  Diagnosis Date   Elevated PSA    Hyperlipidemia    pt says not anymore   Hypertension    Prostate cancer ([place]) 2013    Past Surgical History:  Procedure Laterality Date   bilateral inguinal hernia  2008   BILIARY BRUSHING  06/23/2021   Procedure: BILIARY BRUSHING;  Surgeon: Irving Copas., MD;  Location: [facility];  Service: Gastroenterology;;   BILIARY DILATION  06/23/2021   Procedure: BILIARY DILATION;  Surgeon: Irving Copas., MD;  Location: [facility];  Service: Gastroenterology;;   BILIARY STENT PLACEMENT N/A 06/23/2021   Procedure: BILIARY STENT PLACEMENT;  Surgeon: Irving Copas., MD;  Location: [facility];  Service: Gastroenterology;  Laterality: N/A;   BIOPSY  06/23/2021   Procedure: BIOPSY;  Surgeon: Rush Landmark Telford Nab., MD;  Location: [facility];  Service: Gastroenterology;;   Kathleen Argue STUDY  01/28/2020   Procedure: BUBBLE STUDY;  Surgeon: [name], MD;  Location: [facility];  Service: Cardiovascular;;   CYST REMOVAL TRUNK  2016   sebaceous cyst   CYSTOSCOPY  04/22/2016   Procedure: CYSTOSCOPY;  Surgeon: [name], MD;  Location: [facility];  Service: Urology;;   ERCP N/A 06/23/2021   Procedure: ENDOSCOPIC RETROGRADE CHOLANGIOPANCREATOGRAPHY (ERCP);  Surgeon: Irving Copas., MD;  Location: [facility];  Service: Gastroenterology;  Laterality: N/A;   IR ANGIO INTRA EXTRACRAN SEL COM CAROTID INNOMINATE BILAT MOD SED  05/30/2020   IR ANGIO VERTEBRAL SEL SUBCLAVIAN INNOMINATE UNI R MOD SED  01/25/2020   IR ANGIO VERTEBRAL SEL VERTEBRAL BILAT MOD SED  05/30/2020   IR CT HEAD LTD  01/25/2020   IR PERCUTANEOUS ART THROMBECTOMY/INFUSION INTRACRANIAL INC DIAG ANGIO  01/25/2020   IR US GUIDE VASC ACCESS RIGHT  05/30/2020   KNEE ARTHROSCOPY  2007   right   LOOP RECORDER INSERTION N/A 01/28/2020   Procedure: LOOP RECORDER INSERTION;  Surgeon: [name], MD;  Location: [facility];  Service: Cardiovascular;  Laterality: N/A;   PANCREATIC STENT PLACEMENT  06/23/2021   Procedure: PANCREATIC STENT PLACEMENT;  Surgeon: Irving Copas., MD;  Location: [facility];  Service: Gastroenterology;;   PROSTATE BIOPSY  2008   PROSTATE BIOPSY  2013   PROSTATE BIOPSY  01/28/2016   RADIOACTIVE SEED IMPLANT N/A 04/22/2016   Procedure: RADIOACTIVE SEED IMPLANT/BRACHYTHERAPY IMPLANT;  Surgeon: [name], MD;  Location: [facility];  Service: Urology;  Laterality: N/A;   RADIOLOGY WITH ANESTHESIA N/A 01/24/2020   Procedure: IR WITH ANESTHESIA;  Surgeon: [name], MD;  Location: [facility];  Service: Radiology;  Laterality: N/A;   REMOVAL OF STONES  06/23/2021   Procedure: REMOVAL OF STONES;  Surgeon: Rush Landmark Telford Nab., MD;  Location: [facility];  Service: Gastroenterology;;   Joan Mayans  06/23/2021   Procedure: Joan Mayans;  Surgeon: Rush Landmark Telford Nab., MD;  Location: [facility];  Service: Gastroenterology;;   TEE WITHOUT CARDIOVERSION N/A 01/28/2020   Procedure: TRANSESOPHAGEAL ECHOCARDIOGRAM (TEE);  Surgeon: [name], MD;  Location: [facility];  Service:  Cardiovascular;  Laterality: N/A;   TONSILLECTOMY     TOTAL HIP ARTHROPLASTY  2001   left   TOTAL HIP ARTHROPLASTY Right 01/18/2018   Procedure: RIGHT TOTAL HIP ARTHROPLASTY ANTERIOR APPROACH;  Surgeon: [name], MD;  Location: [facility];  Service: Orthopedics;  Laterality: Right;    There were no vitals filed for this visit.   Subjective Assessment - 09/29/21 1108     Subjective Pt reports no changes since last session, no pain.    Pertinent History history of CVA in 01/2020, recent dx of cholangiocarcinoma in July 2022 getting weekly chemo treatments    Patient Stated Goals "To not worry about being off balance."    Currently in Pain? No/denies                               [facility] Adult PT Treatment/Exercise - 09/29/21 1109       Transfers   Transfers Floor to Transfer    Floor to Transfer 5: Supervision;4: Min assist    Floor to Transfer Details (indicate cue type and reason) Pt able to perform floor to mat transfer at S level today with min cues for rolling to stronger arm to push up.      Ambulation/Gait   Ambulation/Gait Yes    Ambulation/Gait Assistance 5: Supervision    Ambulation/Gait Assistance Details Ambulation around gym for active rest x 230' with emphasis on upright posture, increased stride length and improved gait speed.  He does well with cuing, however once cuing stops, needs continued reminders.    Ambulation Distance (Feet) 230 Feet    Assistive device None    Gait Pattern Step-through pattern;Decreased arm swing - left;Decreased step length - right;Decreased step length - left    Ambulation Surface Level;Indoor      Self-Care   Self-Care Other Self-Care Comments    Other Self-Care Comments  Pt with continued questions regarding schedule.  PT/OT have already gone over and printed schedule for pt and he is insistent on wanting schedule in phone.  Assisted as needed during session.      Neuro Re-ed    Neuro Re-ed Details  Worked on high  level balance for stepping strategy as well as hip strengthening to carryover to improved gait.  Standing at counter top with light UE support alt cone taps x 10 reps>no UE support x 10 reps, tapping across then in front x 10 reps with light UE support>no UE support x 10 reps.  Pt needs mod cues at time to recall task.  Stepping laterally over barrier x 20 reps with light UE support and cues for posture.  In corner standing on foam airex with feet apart EO x 20 secs>head motions side/side and up/down x 10 reps each.  Feet apart EC x 2 sets of 20 secs>head motions x 10 reps up/down and side/side.  Cues for more midline posture as he tends to shift more onto R LE and cues for more upright posture through (in LEs as  well as he maintained flexed posture there as well).  Forward steps alt to floor and back to airex x 10 reps each (with chair in front for support if needed)>lateral step offs x 10 reps each direction (again with intermittent light UE support).      Exercises   Exercises Knee/Hip      Knee/Hip Exercises: Aerobic   Stepper Scifit stepper with BLEs only at level 1.5 resistance x 8 mins (bumped up resistance to 2 during last 2 mins) maintaining rpms in 80's throughout.                       PT Short Term Goals - 09/23/21 1156       PT SHORT TERM GOAL #1   Title Pt will be independent with initial HEP in order to build upon functional gains made in therapy. ALL STGS DUE 09/24/21    Baseline Pt reports doing them most days at home    Time 4    Period Weeks    Status Achieved    Target Date 09/24/21      PT SHORT TERM GOAL #2   Title Pt will improve FGA score to at least a 22/30 in order to indicate decr fall risk.    Baseline 18/30 to 21/30    Time 4    Period Weeks    Status Partially Met      PT SHORT TERM GOAL #3   Title Pt will ambulate at least 300' over level surfaces while scanning environment with supervision and no LOB.    Baseline met    Time 4    Period  Weeks    Status Achieved      PT SHORT TERM GOAL #4   Title Pt will decr 5x sit <> stand time to 11.5 seconds or less in order to indicate improved functional LE strength.    Baseline 14.75  secs to 13.53 secs without support    Time 4    Period Weeks    Status Partially Met      PT SHORT TERM GOAL #5   Title Pt will improve gait speed to at least 2.6 ft/sec in order to demo improved gait efficiency.    Baseline 2.00 ft/sec to 2.75 ft/sec    Time 4    Period Weeks    Status Achieved               PT Long Term Goals - 08/25/21 1227       PT LONG TERM GOAL #1   Title Pt will be independent with final HEP in order to build upon functional gains made in therapy. ALL LTGS DUE 10/24/21    Time 8    Period Weeks    Status New    Target Date 10/24/21      PT LONG TERM GOAL #2   Title Pt will improve FGA score to at least a 26/30 in order to indicate decr fall risk.    Time 8    Period Weeks    Status New      PT LONG TERM GOAL #3   Title 30 second chair stand goal to be written as appropriate to demo improved LE strength and endurance.    Time 8    Period Weeks    Status New      PT LONG TERM GOAL #4   Title Pt will improve gait speed to at least 3.2 ft/sec in order  to demo improved gait efficiency in the community.    Time 8    Period Weeks    Status New      PT LONG TERM GOAL #5   Title Pt will ambulate at least 500' over unlevel surfaces with mod I and scanning environment in order to improve community mobility.    Time 8    Period Weeks    Status New                   Plan - 09/29/21 1357     Clinical Impression Statement Skilled session focused on floor recovery for carryover during last sessions.  His cognition seemed improved this session and he performed at S level with very min cues for turning towards R arm to push up from supine position into SL>tall kneeling.  Also continue to work on high level balance with emphasis on stepping strategy for  improved balance and gait.    Personal Factors and Comorbidities Age;Comorbidity 3+    Comorbidities see above    Examination-Activity Limitations Locomotion Level;Squat;Stairs;Stand    Examination-Participation Restrictions Community Activity;Driving    Stability/Clinical Decision Making Evolving/Moderate complexity    Rehab Potential Good    PT Frequency 2x / week    PT Duration 8 weeks    PT Treatment/Interventions ADLs/Self Care Home Management;Gait training;Stair training;Functional mobility training;Therapeutic activities;Therapeutic exercise;Balance training;Neuromuscular re-education;Patient/family education;Vestibular    PT Next Visit Plan Continue to work on floor transfers as needed . balance on compliant surfaces,  gait training, nustep/scifit    Consulted and Agree with Plan of Care Patient             Patient will benefit from skilled therapeutic intervention in order to improve the following deficits and impairments:  Decreased activity tolerance, Decreased balance, Decreased endurance, Decreased mobility, Decreased strength, Difficulty walking, Impaired perceived functional ability, Postural dysfunction  Visit Diagnosis: Muscle weakness (generalized)  Unsteadiness on feet  Other abnormalities of gait and mobility     Problem List Patient Active Problem List   Diagnosis Date Noted   Cholangitis 07/28/2021   SIRS (systemic inflammatory response syndrome) ([place]) 07/10/2021   Obstructive jaundice due to malignant neoplasm ([place]) 06/24/2021   Klatskin's tumor ([place]) 06/24/2021   Malnutrition of moderate degree 06/24/2021   Abnormal abdominal CT scan 06/19/2021   Jaundice 06/19/2021   Weight loss, unintentional 06/19/2021   Bile duct obstruction 06/19/2021   Left hemiparesis ([place]) 03/28/2020   Chronic bilateral low back pain without sciatica 02/25/2020   Spasticity as late effect of cerebrovascular accident (CVA) 02/25/2020   Cognitive and neurobehavioral  dysfunction    Hypertensive urgency 01/28/2020   Urinary retention 01/28/2020   Right middle cerebral artery stroke ([place]) 01/28/2020   Stroke (cerebrum) ([place]) -  [name] 01/25/2020   Acute respiratory failure with hypoxia (HCC)    Hypotension    Hypokalemia    Hypocalcemia    Encephalopathy acute    Stroke ([place]) 01/24/2020   Middle cerebral artery embolism, right 01/24/2020   Hyperlipidemia 02/21/2019   Impingement syndrome of right shoulder region 02/21/2018   OA (osteoarthritis) of hip 01/18/2018   Prostate cancer ([place]) 03/04/2016   Essential hypertension 11/27/2014   History of colonic polyps 09/26/2014   BACK PAIN, LEFT 12/22/2009   PROSTATE SPECIFIC ANTIGEN, ELEVATED 12/20/2008   LATERAL EPICONDYLITIS 07/15/2008   ACTINIC KERATOSIS, FOREHEAD, LEFT 05/23/2008  WART, LEFT HAND 07/19/2007   ERECTILE DYSFUNCTION, MILD 07/19/2007   HERNIA, BILATERAL INGUINAL W/O OBST/GANGRENE 07/19/2007   MENISCUS TEAR 07/19/2007   Bilateral inguinal hernia 07/19/2007    Cameron Sprang, PT, MPT [facility] [address] [place], [place], [address] Phone: [phone]   Fax:  [phone] 09/29/21, 2:00 PM   Name: Ruben Chavez MRN: [id number] Date of Birth: [birth year]

## 2021-09-29 NOTE — Therapy (Signed)
[facility] [address] [place], [place], [address] Phone: [phone]   Fax:  [phone]  Occupational Therapy Treatment  Patient Details  Name: Ruben Chavez MRN: [id number] Date of Birth: 1951-04-29 Referring Provider (OT): Dr. Cyndi Lennert   Encounter Date: 09/29/2021   OT End of Session - 09/29/21 1037     Visit Number 8    Number of Visits 17    Date for OT Re-Evaluation 10/25/21    Authorization Type [employer]    Authorization - Visit Number 8    Progress Note Due on Visit 10    OT Start Time 1020    OT Stop Time 1100    OT Time Calculation (min) 40 min    Activity Tolerance Patient tolerated treatment well    Behavior During Therapy [facility] for tasks assessed/performed             Past Medical History:  Diagnosis Date   Elevated PSA    Hyperlipidemia    pt says not anymore   Hypertension    Prostate cancer ([place]) 2013    Past Surgical History:  Procedure Laterality Date   bilateral inguinal hernia  2008   BILIARY BRUSHING  06/23/2021   Procedure: BILIARY BRUSHING;  Surgeon: Irving Copas., MD;  Location: [facility];  Service: Gastroenterology;;   BILIARY DILATION  06/23/2021   Procedure: BILIARY DILATION;  Surgeon: Irving Copas., MD;  Location: [facility];  Service: Gastroenterology;;   BILIARY STENT PLACEMENT N/A 06/23/2021   Procedure: BILIARY STENT PLACEMENT;  Surgeon: Irving Copas., MD;  Location: [facility];  Service: Gastroenterology;  Laterality: N/A;   BIOPSY  06/23/2021   Procedure: BIOPSY;  Surgeon: Rush Landmark Telford Nab., MD;  Location: [facility];  Service: Gastroenterology;;   Kathleen Argue STUDY  01/28/2020   Procedure: BUBBLE STUDY;  Surgeon: [name], MD;  Location: [facility];  Service: Cardiovascular;;   CYST REMOVAL TRUNK  2016   sebaceous cyst   CYSTOSCOPY  04/22/2016   Procedure: CYSTOSCOPY;  Surgeon: [name], MD;   Location: [facility];  Service: Urology;;   ERCP N/A 06/23/2021   Procedure: ENDOSCOPIC RETROGRADE CHOLANGIOPANCREATOGRAPHY (ERCP);  Surgeon: Irving Copas., MD;  Location: [facility];  Service: Gastroenterology;  Laterality: N/A;   IR ANGIO INTRA EXTRACRAN SEL COM CAROTID INNOMINATE BILAT MOD SED  05/30/2020   IR ANGIO VERTEBRAL SEL SUBCLAVIAN INNOMINATE UNI R MOD SED  01/25/2020   IR ANGIO VERTEBRAL SEL VERTEBRAL BILAT MOD SED  05/30/2020   IR CT HEAD LTD  01/25/2020   IR PERCUTANEOUS ART THROMBECTOMY/INFUSION INTRACRANIAL INC DIAG ANGIO  01/25/2020   IR US GUIDE VASC ACCESS RIGHT  05/30/2020   KNEE ARTHROSCOPY  2007   right   LOOP RECORDER INSERTION N/A 01/28/2020   Procedure: LOOP RECORDER INSERTION;  Surgeon: [name], MD;  Location: [facility];  Service: Cardiovascular;  Laterality: N/A;   PANCREATIC STENT PLACEMENT  06/23/2021   Procedure: PANCREATIC STENT PLACEMENT;  Surgeon: Irving Copas., MD;  [facility];  Service: Gastroenterology;;   PROSTATE BIOPSY  2008   PROSTATE BIOPSY  2013   PROSTATE BIOPSY  01/28/2016   RADIOACTIVE SEED IMPLANT N/A 04/22/2016   Procedure: RADIOACTIVE SEED IMPLANT/BRACHYTHERAPY IMPLANT;  Surgeon: [name], MD;  Location: [facility];  Service: Urology;  Laterality: N/A;   RADIOLOGY WITH ANESTHESIA N/A 01/24/2020   Procedure: IR WITH ANESTHESIA;  Surgeon: [name], MD;  Location: [facility];  Service: Radiology;  Laterality: N/A;   REMOVAL OF STONES  06/23/2021   Procedure: REMOVAL OF STONES;  Surgeon: Rush Landmark Telford Nab., MD;  Location: [facility];  Service: Gastroenterology;;   Joan Mayans  06/23/2021   Procedure: Joan Mayans;  Surgeon: Rush Landmark Telford Nab., MD;  [facility];  Service: Gastroenterology;;   TEE WITHOUT CARDIOVERSION N/A 01/28/2020   Procedure: TRANSESOPHAGEAL ECHOCARDIOGRAM (TEE);  Surgeon: [name], MD;  Location: [facility];   Service: Cardiovascular;  Laterality: N/A;   TONSILLECTOMY     TOTAL HIP ARTHROPLASTY  2001   left   TOTAL HIP ARTHROPLASTY Right 01/18/2018   Procedure: RIGHT TOTAL HIP ARTHROPLASTY ANTERIOR APPROACH;  Surgeon: [name], MD;  [facility];  Service: Orthopedics;  Laterality: Right;    There were no vitals filed for this visit.   Subjective Assessment - 09/29/21 1022     Subjective  I have family visiting right now    Pertinent History cholangiocarcinoma recently diagnosed 06/2021. PMH: s/p CVA w/ Lt hemiparesis 01/24/20, HTN, prostate CA 2017, Rt sh surgery 2020    Limitations active CA, Hernia, waste collection bag    Patient Stated Goals work on my balance and my arm function    Currently in Pain? No/denies             Reviewed wt bearing HEP for neuro re-educ. Worked on wrist extension for neuro re-educ w/ cues to prevent compensations and rest breaks required d/t muscle fatigue. Instructed to do 5 reps, rest, then do 5 more.   UBE  x 8 min w/ Lt hand wrapped for reciprocal movement pattern and UB endurance/conditioning  Reviewed short term goals and progress to date.  Box & Blocks LUE = 13 (w/ thumb splint on)   Practiced picking up blocks Lt hand w/ thumb splint on  Pt more attentive today and not as distracted.                          OT Short Term Goals - 09/29/21 1039       OT SHORT TERM GOAL #1   Title Pt will be independent with revised/updated HEP    Time 4    Period Weeks    Status Achieved      OT SHORT TERM GOAL #2   Title Will review splint wear and care and update splints prn    Time 4    Period Weeks    Status Achieved      OT SHORT TERM GOAL #3   Title Pt to consistently cut food with rocker knife I'ly    Baseline pt admits to having wife do it mostly    Time 4    Period Weeks    Status Achieved      OT SHORT TERM GOAL #4   Title Pt will be able to use LUE consistently as a stabilizer for simple ADL tasks.     Time 4    Period Weeks    Status On-going               OT Long Term Goals - 09/29/21 1054       OT LONG TERM GOAL #1   Title Pt will improve LUE function by performing 8 on Box & Blocks test    Baseline Lt = 2 (was 10 at d/c last year)    Time 8    Period Weeks    Status Achieved   LUE =  13 (w/ thumb splint on)     OT LONG TERM GOAL #2   Title Pt will consistently make simple snack w/ one handed techniques and A/E prn    Time 8    Period Weeks    Status New      OT LONG TERM GOAL #3   Title Pt will perform 15 min of physical activity w/o rest to increase endurance    Time 8    Period Weeks    Status New                   Plan - 09/29/21 1039     Clinical Impression Statement Pt has met 3/4 STG's and 1 LTG. Pt limited by cognitive deficits.    OT Occupational Profile and History Detailed Assessment- Review of Records and additional review of physical, cognitive, psychosocial history related to current functional performance    Occupational performance deficits (Please refer to evaluation for details): IADL's;Leisure;Social Participation    Body Structure / Function / Physical Skills Strength;Balance;Dexterity;Body mechanics;UE functional use;ROM;IADL;Endurance;Coordination;Mobility;FMC    Cognitive Skills Attention    Comorbidities Affecting Occupational Performance: Presence of comorbidities impacting occupational performance    Comorbidities impacting occupational performance description: residual Lt hemiparesis from stroke, active CA    Modification or Assistance to Complete Evaluation  No modification of tasks or assist necessary to complete eval    OT Frequency 2x / week    OT Duration 8 weeks   anticipate only 6 weeks   OT Treatment/Interventions Self-care/ADL training;Moist Heat;Fluidtherapy;DME and/or AE instruction;Therapeutic activities;Splinting;Coping strategies training;Cognitive remediation/compensation;Therapeutic exercise;Neuromuscular  education;Cryotherapy;Functional Mobility Training;Passive range of motion;Visual/perceptual remediation/compensation;Patient/family education;Manual Therapy;Electrical Stimulation;Energy conservation;Paraffin;Ultrasound    Plan continue progress towards STG #4 and remaining LTG's. Anticipate d/c in 1-2 weeks    Consulted and Agree with Plan of Care Patient             Patient will benefit from skilled therapeutic intervention in order to improve the following deficits and impairments:   Body Structure / Function / Physical Skills: Strength, Balance, Dexterity, Body mechanics, UE functional use, ROM, IADL, Endurance, Coordination, Mobility, [facility] Cognitive Skills: Attention     Visit Diagnosis: Hemiplegia and hemiparesis following cerebral infarction affecting left non-dominant side (HCC)  Attention and concentration deficit  Muscle weakness (generalized)  Neurologic neglect syndrome    Problem List Patient Active Problem List   Diagnosis Date Noted   Cholangitis 07/28/2021   SIRS (systemic inflammatory response syndrome) ([place]) 07/10/2021   Obstructive jaundice due to malignant neoplasm ([place]) 06/24/2021   Klatskin's tumor ([place]) 06/24/2021   Malnutrition of moderate degree 06/24/2021   Abnormal abdominal CT scan 06/19/2021   Jaundice 06/19/2021   Weight loss, unintentional 06/19/2021   Bile duct obstruction 06/19/2021   Left hemiparesis ([place]) 03/28/2020   Chronic bilateral low back pain without sciatica 02/25/2020   Spasticity as late effect of cerebrovascular accident (CVA) 02/25/2020   Cognitive and neurobehavioral dysfunction    Hypertensive urgency 01/28/2020   Urinary retention 01/28/2020   Right middle cerebral artery stroke ([place]) 01/28/2020   Stroke (cerebrum) ([place]) -  [name] 01/25/2020   Acute respiratory failure with hypoxia (HCC)    Hypotension     Hypokalemia    Hypocalcemia    Encephalopathy acute    Stroke ([place]) 01/24/2020   Middle cerebral artery embolism, right 01/24/2020   Hyperlipidemia 02/21/2019   Impingement syndrome of  right shoulder region 02/21/2018   OA (osteoarthritis) of hip 01/18/2018   Prostate cancer ([place]) 03/04/2016   Essential hypertension 11/27/2014   History of colonic polyps 09/26/2014   BACK PAIN, LEFT 12/22/2009   PROSTATE SPECIFIC ANTIGEN, ELEVATED 12/20/2008   LATERAL EPICONDYLITIS 07/15/2008   ACTINIC KERATOSIS, FOREHEAD, LEFT 05/23/2008   WART, LEFT HAND 07/19/2007   ERECTILE DYSFUNCTION, MILD 07/19/2007   HERNIA, BILATERAL INGUINAL W/O OBST/GANGRENE 07/19/2007   MENISCUS TEAR 07/19/2007   Bilateral inguinal hernia 07/19/2007    Carey Bullocks, OTR/L 09/29/2021, 11:00 AM  [facility] [address] [place], [place], [address] Phone: [phone]   Fax:  [phone]  Name: JCEON ALVERIO MRN: [id number] Date of Birth: [birth year]

## 2021-09-30 ENCOUNTER — Ambulatory Visit: Payer: PPO | Admitting: Occupational Therapy

## 2021-09-30 ENCOUNTER — Ambulatory Visit: Payer: PPO | Admitting: Internal Medicine

## 2021-09-30 ENCOUNTER — Ambulatory Visit: Payer: PPO | Admitting: Physical Therapy

## 2021-10-06 ENCOUNTER — Ambulatory Visit (INDEPENDENT_AMBULATORY_CARE_PROVIDER_SITE_OTHER): Payer: PPO | Admitting: Internal Medicine

## 2021-10-06 ENCOUNTER — Telehealth: Payer: Self-pay | Admitting: Internal Medicine

## 2021-10-06 ENCOUNTER — Other Ambulatory Visit: Payer: Self-pay

## 2021-10-06 ENCOUNTER — Encounter: Payer: Self-pay | Admitting: Internal Medicine

## 2021-10-06 ENCOUNTER — Encounter: Payer: PPO | Admitting: Physical Medicine & Rehabilitation

## 2021-10-06 DIAGNOSIS — F320 Major depressive disorder, single episode, mild: Secondary | ICD-10-CM | POA: Diagnosis not present

## 2021-10-06 DIAGNOSIS — Z23 Encounter for immunization: Secondary | ICD-10-CM | POA: Diagnosis not present

## 2021-10-06 DIAGNOSIS — C221 Intrahepatic bile duct carcinoma: Secondary | ICD-10-CM | POA: Diagnosis not present

## 2021-10-06 NOTE — Telephone Encounter (Signed)
Ruben Chavez with Dr. Ledell Noss office states the patient left his device monitor somewhere. Phone: [phone] Also on epic and teams

## 2021-10-06 NOTE — Addendum Note (Signed)
Addended by: Westley Hummer B on: 10/06/2021 05:21 PM   Modules accepted: Orders

## 2021-10-06 NOTE — Patient Instructions (Signed)
-  Nice seeing you today!!  -Flu vaccine today.  -Schedule follow up in 6 months or sooner as needed.

## 2021-10-06 NOTE — Progress Notes (Signed)
Established Patient Office Visit     This visit occurred during the SARS-CoV-2 public health emergency.  Safety protocols were in place, including screening questions prior to the visit, additional usage of staff PPE, and extensive cleaning of exam room while observing appropriate contact time as indicated for disinfecting solutions.    CC/Reason for Visit: Follow-up chronic conditions  HPI: Ruben Chavez is a 70 y.o. male who is coming in today for the above mentioned reasons. Past Medical History is significant for: Recent diagnosis of cholangiocarcinoma undergoing chemotherapy treatment at [facility].  He also has a history of hypertension, hyperlipidemia, prior CVA, prostate cancer.  He has been doing as well as can be expected in regards to his recent cholangiocarcinoma diagnosis and treatment.  He has an appointment with his oncologist next week.  He is requesting a flu vaccine today.  He is requesting information on cognitive behavioral therapy.  He has misplaced his monitor that goes with his loop recorder and is requesting prescription for replacement.   Past Medical/Surgical History: Past Medical History:  Diagnosis Date   Elevated PSA    Hyperlipidemia    pt says not anymore   Hypertension    Prostate cancer (Ruben Chavez) 2013    Past Surgical History:  Procedure Laterality Date   bilateral inguinal hernia  2008   BILIARY BRUSHING  06/23/2021   Procedure: BILIARY BRUSHING;  Surgeon: Ruben Chavez., MD;  Location: [facility];  Service: Gastroenterology;;   BILIARY DILATION  06/23/2021   Procedure: BILIARY DILATION;  Surgeon: Ruben Chavez., MD;  Location: [facility];  Service: Gastroenterology;;   BILIARY STENT PLACEMENT N/A 06/23/2021   Procedure: BILIARY STENT PLACEMENT;  Surgeon: Ruben Chavez., MD;  Location: [facility];  Service: Gastroenterology;  Laterality: N/A;   BIOPSY  06/23/2021   Procedure: BIOPSY;  Surgeon:  Ruben Landmark Telford Nab., MD;  Location: [facility];  Service: Gastroenterology;;   Ruben Chavez STUDY  01/28/2020   Procedure: BUBBLE STUDY;  Surgeon: Ruben Hector, MD;  Location: [facility];  Service: Cardiovascular;;   CYST REMOVAL TRUNK  2016   sebaceous cyst   CYSTOSCOPY  04/22/2016   Procedure: CYSTOSCOPY;  Surgeon: Ruben Gallo, MD;  Location: [facility];  Service: Urology;;   ERCP N/A 06/23/2021   Procedure: ENDOSCOPIC RETROGRADE CHOLANGIOPANCREATOGRAPHY (ERCP);  Surgeon: Ruben Chavez., MD;  Location: [facility];  Service: Gastroenterology;  Laterality: N/A;   IR ANGIO INTRA EXTRACRAN SEL COM CAROTID INNOMINATE BILAT MOD SED  05/30/2020   IR ANGIO VERTEBRAL SEL SUBCLAVIAN INNOMINATE UNI R MOD SED  01/25/2020   IR ANGIO VERTEBRAL SEL VERTEBRAL BILAT MOD SED  05/30/2020   IR CT HEAD LTD  01/25/2020   IR PERCUTANEOUS ART THROMBECTOMY/INFUSION INTRACRANIAL INC DIAG ANGIO  01/25/2020   IR US GUIDE VASC ACCESS RIGHT  05/30/2020   KNEE ARTHROSCOPY  2007   right   LOOP RECORDER INSERTION N/A 01/28/2020   Procedure: LOOP RECORDER INSERTION;  Surgeon: Ruben Sprang, MD;  Location: [facility];  Service: Cardiovascular;  Laterality: N/A;   PANCREATIC STENT PLACEMENT  06/23/2021   Procedure: PANCREATIC STENT PLACEMENT;  Surgeon: Ruben Chavez., MD;  Location: [facility];  Service: Gastroenterology;;   PROSTATE BIOPSY  2008   PROSTATE BIOPSY  2013   PROSTATE BIOPSY  01/28/2016   RADIOACTIVE SEED IMPLANT N/A 04/22/2016   Procedure: RADIOACTIVE SEED IMPLANT/BRACHYTHERAPY IMPLANT;  Surgeon: Ruben Gallo, MD;  Location: [facility];  Service:  Urology;  Laterality: N/A;   RADIOLOGY WITH ANESTHESIA N/A 01/24/2020   Procedure: IR WITH ANESTHESIA;  Surgeon: Ruben Bras, MD;  Location: [place];  Service: Radiology;  Laterality: N/A;   REMOVAL OF STONES  06/23/2021   Procedure: REMOVAL OF STONES;  Surgeon: Ruben Landmark Telford Nab., MD;   Location: [facility];  Service: Gastroenterology;;   Ruben Chavez  06/23/2021   Procedure: Ruben Chavez;  Surgeon: Ruben Landmark Telford Nab., MD;  Location: [facility];  Service: Gastroenterology;;   TEE WITHOUT CARDIOVERSION N/A 01/28/2020   Procedure: TRANSESOPHAGEAL ECHOCARDIOGRAM (TEE);  Surgeon: Ruben Hector, MD;  Location: [facility];  Service: Cardiovascular;  Laterality: N/A;   TONSILLECTOMY     TOTAL HIP ARTHROPLASTY  2001   left   TOTAL HIP ARTHROPLASTY Right 01/18/2018   Procedure: RIGHT TOTAL HIP ARTHROPLASTY ANTERIOR APPROACH;  Surgeon: Ruben Arabian, MD;  Location: [facility];  Service: Orthopedics;  Laterality: Right;    Social History:  reports that he quit smoking about 22 years ago. His smoking use included cigarettes. He has a 32.00 pack-year smoking history. He has never used smokeless tobacco. He reports that he does not currently use alcohol after a past usage of about 14.0 standard drinks per week. He reports that he does not use drugs.  Allergies: Allergies  Allergen Reactions   Sulfa Antibiotics Swelling and Other (See Comments)    Swelling in ankles  Swelling in ankles   Advil [Ibuprofen] Other (See Comments)    "Dots on chest" (Petechiae)   Aleve [Naproxen] Other (See Comments)    "Dots on chest" (Petechiae)   Betadine [Povidone Iodine] Itching and Rash   Chloroxylenol (Antiseptic) Itching, Rash and Other (See Comments)    PCMX surgical sterilizing scrub   Poison Ivy Extract Rash   Povidone-Iodine Itching and Rash    BETADINE    Family History:  Family History  Problem Relation Age of Onset   Pancreatic cancer Mother        small bowel cancer   Heart disease Father    Colon cancer Neg Hx    Stomach cancer Neg Hx      Current Outpatient Medications:    ASPIRIN LOW DOSE 81 MG EC tablet, Take 81 mg by mouth daily., Disp: , Rfl:    Baclofen 5 MG TABS, Take by mouth., Disp: , Rfl:    buPROPion (WELLBUTRIN XL) 150 MG 24 hr tablet, Take 150 mg  by mouth daily as needed., Disp: , Rfl:    folic acid (FOLVITE) 1 MG tablet, Take 1 mg by mouth daily., Disp: , Rfl:    KRILL OIL PO, Take 1 tablet by mouth daily., Disp: , Rfl:    lidocaine (XYLOCAINE) 5 % ointment, Apply thin layer to port site 30-60 minutes prior to accessing., Disp: , Rfl:    lidocaine-prilocaine (EMLA) cream, Apply thin layer to port 30-60 minutes prior to accessing., Disp: , Rfl:    lisinopril-hydrochlorothiazide (ZESTORETIC) 20-25 MG tablet, Take 1 tablet by mouth daily., Disp: , Rfl:    Magnesium 250 MG TABS, Take by mouth., Disp: , Rfl:    Multiple Vitamin (MULTIVITAMIN) capsule, Take 1 capsule by mouth daily., Disp: , Rfl:    senna (SENOKOT) 8.6 MG tablet, Take 1 tablet by mouth daily., Disp: , Rfl:    traZODone (DESYREL) 100 MG tablet, Take 100 mg by mouth at bedtime., Disp: , Rfl:   Review of Systems:  Constitutional: Denies fever, chills, diaphoresis, appetite change. HEENT: Denies photophobia, eye pain, redness, hearing loss, ear pain,  congestion, sore throat, rhinorrhea, sneezing, mouth sores, trouble swallowing, neck pain, neck stiffness and tinnitus.   Respiratory: Denies SOB, DOE, cough, chest tightness,  and wheezing.   Cardiovascular: Denies chest pain, palpitations and leg swelling.  Gastrointestinal: Denies nausea, vomiting,  diarrhea, constipation, blood in stool and abdominal distention.  Genitourinary: Denies dysuria, urgency, frequency, hematuria, flank pain and difficulty urinating.  Endocrine: Denies: hot or cold intolerance, sweats, changes in hair or nails, polyuria, polydipsia. Musculoskeletal: Denies myalgias, back pain, joint swelling, arthralgias and gait problem.  Skin: Denies pallor, rash and wound.  Neurological: Denies dizziness, seizures, syncope, weakness, light-headedness, numbness and headaches.  Hematological: Denies adenopathy. Easy bruising, personal or family bleeding history  Psychiatric/Behavioral: Denies suicidal ideation,  confusion, nervousness, sleep disturbance and agitation    Physical Exam: Vitals:   10/06/21 1443  BP: 102/68  Pulse: 74  Temp: 98 F (36.7 C)  TempSrc: Oral  SpO2: 98%  Weight: 145 lb 8 oz (66 kg)    Body mass index is 21.49 kg/m.   Constitutional: NAD, calm, comfortable Eyes: PERRL, lids and conjunctivae normal, wears corrective lenses ENMT: Mucous membranes are moist.  Respiratory: clear to auscultation bilaterally, no wheezing, no crackles. Normal respiratory effort. No accessory muscle use.  Cardiovascular: Regular rate and rhythm, no murmurs / rubs / gallops. No extremity edema.  Psychiatric: Normal judgment and insight. Alert and oriented x 3. Normal mood.    Impression and Plan:  Need for influenza vaccination -Flu vaccine in office today.  Cholangiocarcinoma ([place]) -Followed by oncology and IR and GI at [facility].  Notes have been reviewed.  Depression, major, single episode, mild (HCC) -Agree with CBT, information on how to schedule has been provided to him today.  Time spent: 32 minutes reviewing chart, interviewing and examining patient and formulating plan of care.   Patient Instructions  -Nice seeing you today!!  -Flu vaccine today.  -Schedule follow up in 6 months or sooner as needed.   Lelon Frohlich, MD [facility]

## 2021-10-07 ENCOUNTER — Ambulatory Visit: Payer: PPO | Attending: Internal Medicine | Admitting: Physical Therapy

## 2021-10-07 ENCOUNTER — Encounter: Payer: Self-pay | Admitting: Physical Therapy

## 2021-10-07 ENCOUNTER — Ambulatory Visit: Payer: PPO | Admitting: Occupational Therapy

## 2021-10-07 DIAGNOSIS — R2689 Other abnormalities of gait and mobility: Secondary | ICD-10-CM | POA: Insufficient documentation

## 2021-10-07 DIAGNOSIS — I69354 Hemiplegia and hemiparesis following cerebral infarction affecting left non-dominant side: Secondary | ICD-10-CM | POA: Diagnosis not present

## 2021-10-07 DIAGNOSIS — R41840 Attention and concentration deficit: Secondary | ICD-10-CM | POA: Diagnosis not present

## 2021-10-07 DIAGNOSIS — M6281 Muscle weakness (generalized): Secondary | ICD-10-CM | POA: Insufficient documentation

## 2021-10-07 DIAGNOSIS — R414 Neurologic neglect syndrome: Secondary | ICD-10-CM | POA: Insufficient documentation

## 2021-10-07 DIAGNOSIS — R2681 Unsteadiness on feet: Secondary | ICD-10-CM | POA: Diagnosis not present

## 2021-10-07 NOTE — Telephone Encounter (Signed)
Spoke with patient.  He states he last had the monitor when he was admitted at [facility].  He was transferred to [facility].  He has contacted both hospitals and neither has the monitor.    Advised patient that we can order a new monitor from [employer].  Pt states someone at [employer] told him a new monitor would cost thousands of dollars.  He states he spoke with his insurance company and they will pay for the monitor.  Advised patient that my ability is onl;y to order the new monitor, if [employer] is going to charge him for the new monitor, that is between him and [employer].      New monitor order submitted to [employer] by [facility], CMA.

## 2021-10-07 NOTE — Therapy (Signed)
[facility] [address] [place], [place], [address] Phone: [phone]   Fax:  [phone]  Occupational Therapy Treatment  Patient Details  Name: Ruben Chavez MRN: [id number] Date of Birth: September 25, 1951 Referring Provider (OT): Dr. Cyndi Lennert   Encounter Date: 10/07/2021   OT End of Session - 10/07/21 1350     Visit Number 9    Number of Visits 17    Date for OT Re-Evaluation 10/25/21    Authorization Type [employer]    Authorization - Visit Number 9    Progress Note Due on Visit 10    OT Start Time 1315    OT Stop Time 1400    OT Time Calculation (min) 45 min    Activity Tolerance Patient tolerated treatment well    Behavior During Therapy [facility] for tasks assessed/performed             Past Medical History:  Diagnosis Date   Elevated PSA    Hyperlipidemia    pt says not anymore   Hypertension    Prostate cancer ([place]) 2013    Past Surgical History:  Procedure Laterality Date   bilateral inguinal hernia  2008   BILIARY BRUSHING  06/23/2021   Procedure: BILIARY BRUSHING;  Surgeon: Irving Copas., MD;  Location: [facility];  Service: Gastroenterology;;   BILIARY DILATION  06/23/2021   Procedure: BILIARY DILATION;  Surgeon: Irving Copas., MD;  Location: [facility];  Service: Gastroenterology;;   BILIARY STENT PLACEMENT N/A 06/23/2021   Procedure: BILIARY STENT PLACEMENT;  Surgeon: Irving Copas., MD;  Location: [facility];  Service: Gastroenterology;  Laterality: N/A;   BIOPSY  06/23/2021   Procedure: BIOPSY;  Surgeon: Rush Landmark Telford Nab., MD;  Location: [facility];  Service: Gastroenterology;;   Kathleen Argue STUDY  01/28/2020   Procedure: BUBBLE STUDY;  Surgeon: [name], MD;  Location: [facility];  Service: Cardiovascular;;   CYST REMOVAL TRUNK  2016   sebaceous cyst   CYSTOSCOPY  04/22/2016   Procedure: CYSTOSCOPY;  Surgeon: [name], MD;   Location[facility];  Service: Urology;;   ERCP N/A 06/23/2021   Procedure: ENDOSCOPIC RETROGRADE CHOLANGIOPANCREATOGRAPHY (ERCP);  Surgeon: Irving Copas., MD;  Location: [facility];  Service: Gastroenterology;  Laterality: N/A;   IR ANGIO INTRA EXTRACRAN SEL COM CAROTID INNOMINATE BILAT MOD SED  05/30/2020   IR ANGIO VERTEBRAL SEL SUBCLAVIAN INNOMINATE UNI R MOD SED  01/25/2020   IR ANGIO VERTEBRAL SEL VERTEBRAL BILAT MOD SED  05/30/2020   IR CT HEAD LTD  01/25/2020   IR PERCUTANEOUS ART THROMBECTOMY/INFUSION INTRACRANIAL INC DIAG ANGIO  01/25/2020   IR US GUIDE VASC ACCESS RIGHT  05/30/2020   KNEE ARTHROSCOPY  2007   right   LOOP RECORDER INSERTION N/A 01/28/2020   Procedure: LOOP RECORDER INSERTION;  Surgeon: [name], MD;  Location: [facility];  Service: Cardiovascular;  Laterality: N/A;   PANCREATIC STENT PLACEMENT  06/23/2021   Procedure: PANCREATIC STENT PLACEMENT;  Surgeon: Irving Copas., MD;  [facility];  Service: Gastroenterology;;   PROSTATE BIOPSY  2008   PROSTATE BIOPSY  2013   PROSTATE BIOPSY  01/28/2016   RADIOACTIVE SEED IMPLANT N/A 04/22/2016   Procedure: RADIOACTIVE SEED IMPLANT/BRACHYTHERAPY IMPLANT;  Surgeon: [name], MD;  Location: [facility];  Service: Urology;  Laterality: N/A;   RADIOLOGY WITH ANESTHESIA N/A 01/24/2020   Procedure: IR WITH ANESTHESIA;  Surgeon: [name], MD;  Location: [facility];  Service: Radiology;  Laterality: N/A;   REMOVAL OF STONES  06/23/2021   Procedure: REMOVAL OF STONES;  Surgeon: Rush Landmark Telford Nab., MD;  Location: [facility];  Service: Gastroenterology;;   Joan Mayans  06/23/2021   Procedure: Joan Mayans;  Surgeon: Rush Landmark Telford Nab., MD;  [facility];  Service: Gastroenterology;;   TEE WITHOUT CARDIOVERSION N/A 01/28/2020   Procedure: TRANSESOPHAGEAL ECHOCARDIOGRAM (TEE);  Surgeon: [name], MD;  Location: [facility];   Service: Cardiovascular;  Laterality: N/A;   TONSILLECTOMY     TOTAL HIP ARTHROPLASTY  2001   left   TOTAL HIP ARTHROPLASTY Right 01/18/2018   Procedure: RIGHT TOTAL HIP ARTHROPLASTY ANTERIOR APPROACH;  Surgeon: [name], MD;  [facility];  Service: Orthopedics;  Laterality: Right;    There were no vitals filed for this visit.   Subjective Assessment - 10/07/21 1350     Subjective  I would like to make a sandwich    Pertinent History cholangiocarcinoma recently diagnosed 06/2021. PMH: s/p CVA w/ Lt hemiparesis 01/24/20, HTN, prostate CA 2017, Rt sh surgery 2020    Limitations active CA, Hernia, waste collection bag    Patient Stated Goals work on my balance and my arm function    Currently in Pain? No/denies             Unable to make sandwich today d/t no bread in clinic, however discussed A/E recommendations and task modifications that would allow pt to make sandwich one handed. Pt issued handout on A/E.  Also practiced using LUE as stabilizer to hold containers against chest while opening top with Rt hand.   UBE x 8 min, level 1 with Lt hand wrapped for reciprocal movement pattern and UB endurance.   Discussed d/c from O.T. next week                        OT Short Term Goals - 09/29/21 1039       OT SHORT TERM GOAL #1   Title Pt will be independent with revised/updated HEP    Time 4    Period Weeks    Status Achieved      OT SHORT TERM GOAL #2   Title Will review splint wear and care and update splints prn    Time 4    Period Weeks    Status Achieved      OT SHORT TERM GOAL #3   Title Pt to consistently cut food with rocker knife I'ly    Baseline pt admits to having wife do it mostly    Time 4    Period Weeks    Status Achieved      OT SHORT TERM GOAL #4   Title Pt will be able to use LUE consistently as a stabilizer for simple ADL tasks.    Time 4    Period Weeks    Status On-going               OT Long Term Goals -  09/29/21 1054       OT LONG TERM GOAL #1   Title Pt will improve LUE function by performing 8 on [employer] test    Baseline Lt = 2 (was 10 at d/c last year)    Time 8    Period Weeks    Status Achieved   LUE = 13 (w/ thumb splint on)     OT LONG TERM GOAL #2   Title Pt will consistently  make simple snack w/ one handed techniques and A/E prn    Time 8    Period Weeks    Status New      OT LONG TERM GOAL #3   Title Pt will perform 15 min of physical activity w/o rest to increase endurance    Time 8    Period Weeks    Status New                   Plan - 10/07/21 1351     Clinical Impression Statement Pt has met 3/4 STG's and 1 LTG. Pt limited by cognitive deficits.    OT Occupational Profile and History Detailed Assessment- Review of Records and additional review of physical, cognitive, psychosocial history related to current functional performance    Occupational performance deficits (Please refer to evaluation for details): IADL's;Leisure;Social Participation    Body Structure / Function / Physical Skills Strength;Balance;Dexterity;Body mechanics;UE functional use;ROM;IADL;Endurance;Coordination;Mobility;FMC    Cognitive Skills Attention    Comorbidities Affecting Occupational Performance: Presence of comorbidities impacting occupational performance    Comorbidities impacting occupational performance description: residual Lt hemiparesis from stroke, active CA    Modification or Assistance to Complete Evaluation  No modification of tasks or assist necessary to complete eval    OT Frequency 2x / week    OT Duration 8 weeks   anticipate only 6 weeks   OT Treatment/Interventions Self-care/ADL training;Moist Heat;Fluidtherapy;DME and/or AE instruction;Therapeutic activities;Splinting;Coping strategies training;Cognitive remediation/compensation;Therapeutic exercise;Neuromuscular education;Cryotherapy;Functional Mobility Training;Passive range of motion;Visual/perceptual  remediation/compensation;Patient/family education;Manual Therapy;Electrical Stimulation;Energy conservation;Paraffin;Ultrasound    Plan 10th progress note, continue progress towards STG #4 and remaining LTG's. Anticipate d/c next week    Consulted and Agree with Plan of Care Patient             Patient will benefit from skilled therapeutic intervention in order to improve the following deficits and impairments:   Body Structure / Function / Physical Skills: Strength, Balance, Dexterity, Body mechanics, UE functional use, ROM, IADL, Endurance, Coordination, Mobility, [facility] Cognitive Skills: Attention     Visit Diagnosis: Hemiplegia and hemiparesis following cerebral infarction affecting left non-dominant side (HCC)  Attention and concentration deficit  Neurologic neglect syndrome  Muscle weakness (generalized)    Problem List Patient Active Problem List   Diagnosis Date Noted   Cholangitis 07/28/2021   SIRS (systemic inflammatory response syndrome) ([place]) 07/10/2021   Obstructive jaundice due to malignant neoplasm ([place]) 06/24/2021   [place]'s tumor ([place]) 06/24/2021   Malnutrition of moderate degree 06/24/2021   Abnormal abdominal CT scan 06/19/2021   Jaundice 06/19/2021   Weight loss, unintentional 06/19/2021   Bile duct obstruction 06/19/2021   Left hemiparesis ([place]) 03/28/2020   Chronic bilateral low back pain without sciatica 02/25/2020   Spasticity as late effect of cerebrovascular accident (CVA) 02/25/2020   Cognitive and neurobehavioral dysfunction    Hypertensive urgency 01/28/2020   Urinary retention 01/28/2020   Right middle cerebral artery stroke ([place]) 01/28/2020   Stroke (cerebrum) ([place]) -  [name] 01/25/2020   Acute respiratory failure with hypoxia (HCC)    Hypotension    Hypokalemia    Hypocalcemia    Encephalopathy acute    Stroke ([place]) 01/24/2020    Middle cerebral artery embolism, right 01/24/2020   Hyperlipidemia 02/21/2019   Impingement syndrome of right shoulder region 02/21/2018   OA (osteoarthritis) of hip 01/18/2018   Prostate cancer ([place]) 03/04/2016  Essential hypertension 11/27/2014   History of colonic polyps 09/26/2014   BACK PAIN, LEFT 12/22/2009   PROSTATE SPECIFIC ANTIGEN, ELEVATED 12/20/2008   LATERAL EPICONDYLITIS 07/15/2008   ACTINIC KERATOSIS, FOREHEAD, LEFT 05/23/2008   WART, LEFT HAND 07/19/2007   ERECTILE DYSFUNCTION, MILD 07/19/2007   HERNIA, BILATERAL INGUINAL W/O OBST/GANGRENE 07/19/2007   MENISCUS TEAR 07/19/2007   Bilateral inguinal hernia 07/19/2007    Carey Bullocks, OTR/L 10/07/2021, 1:52 PM  [facility] [address] [place], [place], [address] Phone: [phone]   Fax:  [phone]  Name: Ruben Chavez MRN: [id number] Date of Birth: [birth year]

## 2021-10-07 NOTE — Telephone Encounter (Signed)
I tried to help the patient with ordering a monitor. He refused to speak with me and wanted to speak with Amy directly. He raised his voice and was very rude to me. I let him speak with Amy, rn.

## 2021-10-07 NOTE — Telephone Encounter (Signed)
Attempted to contact patient, no answer.  LVM for patient to callback to discuss.   Last communication from [facility] was 07/27/21.

## 2021-10-07 NOTE — Therapy (Signed)
[facility] [place] [address] [place], [place], [address] Phone: [phone]   Fax:  [phone]  Physical Therapy Treatment  Patient Details  Name: Ruben Chavez MRN: [id number] Date of Birth: 1951/01/30 Referring Provider (PT): Leeann Must, MD [facility] MD)   Encounter Date: 10/07/2021   PT End of Session - 10/07/21 1245     Visit Number 6    Number of Visits 17    Date for PT Re-Evaluation 10/24/21    Authorization Type [employer]    Progress Note Due on Visit 10    PT Start Time 1240   pt running late for appt   PT Stop Time 1315    PT Time Calculation (min) 35 min    Equipment Utilized During Treatment Gait belt    Activity Tolerance Patient tolerated treatment well    Behavior During Therapy [facility] for tasks assessed/performed             Past Medical History:  Diagnosis Date   Elevated PSA    Hyperlipidemia    pt says not anymore   Hypertension    Prostate cancer ([place]) 2013    Past Surgical History:  Procedure Laterality Date   bilateral inguinal hernia  2008   BILIARY BRUSHING  06/23/2021   Procedure: BILIARY BRUSHING;  Surgeon: Irving Copas., MD;  Location: [facility];  Service: Gastroenterology;;   BILIARY DILATION  06/23/2021   Procedure: BILIARY DILATION;  Surgeon: Irving Copas., MD;  Location: [facility];  Service: Gastroenterology;;   BILIARY STENT PLACEMENT N/A 06/23/2021   Procedure: BILIARY STENT PLACEMENT;  Surgeon: Irving Copas., MD;  Location: [facility];  Service: Gastroenterology;  Laterality: N/A;   BIOPSY  06/23/2021   Procedure: BIOPSY;  Surgeon: Rush Landmark Telford Nab., MD;  Location: [facility];  Service: Gastroenterology;;   Kathleen Argue STUDY  01/28/2020   Procedure: BUBBLE STUDY;  Surgeon: [name], MD;  Location: [place];  Service: Cardiovascular;;   CYST REMOVAL TRUNK  2016   sebaceous cyst   CYSTOSCOPY  04/22/2016    Procedure: CYSTOSCOPY;  Surgeon: [name], MD;  Location: [facility];  Service: Urology;;   ERCP N/A 06/23/2021   Procedure: ENDOSCOPIC RETROGRADE CHOLANGIOPANCREATOGRAPHY (ERCP);  Surgeon: Irving Copas., MD;  Location: [facility];  Service: Gastroenterology;  Laterality: N/A;   IR ANGIO INTRA EXTRACRAN SEL COM CAROTID INNOMINATE BILAT MOD SED  05/30/2020   IR ANGIO VERTEBRAL SEL SUBCLAVIAN INNOMINATE UNI R MOD SED  01/25/2020   IR ANGIO VERTEBRAL SEL VERTEBRAL BILAT MOD SED  05/30/2020   IR CT HEAD LTD  01/25/2020   IR PERCUTANEOUS ART THROMBECTOMY/INFUSION INTRACRANIAL INC DIAG ANGIO  01/25/2020   IR US GUIDE VASC ACCESS RIGHT  05/30/2020   KNEE ARTHROSCOPY  2007   right   LOOP RECORDER INSERTION N/A 01/28/2020   Procedure: LOOP RECORDER INSERTION;  Surgeon: [name], MD;  Location: [facility];  Service: Cardiovascular;  Laterality: N/A;   PANCREATIC STENT PLACEMENT  06/23/2021   Procedure: PANCREATIC STENT PLACEMENT;  Surgeon: Irving Copas., MD;  Location: [facility];  Service: Gastroenterology;;   PROSTATE BIOPSY  2008   PROSTATE BIOPSY  2013   PROSTATE BIOPSY  01/28/2016   RADIOACTIVE SEED IMPLANT N/A 04/22/2016   Procedure: RADIOACTIVE SEED IMPLANT/BRACHYTHERAPY IMPLANT;  Surgeon: [name], MD;  Location: [facility];  Service: Urology;  Laterality: N/A;   RADIOLOGY WITH ANESTHESIA N/A 01/24/2020   Procedure:  IR WITH ANESTHESIA;  Surgeon: [name], MD;  Location: [place];  Service: Radiology;  Laterality: N/A;   REMOVAL OF STONES  06/23/2021   Procedure: REMOVAL OF STONES;  Surgeon: Rush Landmark Telford Nab., MD;  Location: [facility];  Service: Gastroenterology;;   Joan Mayans  06/23/2021   Procedure: Joan Mayans;  Surgeon: Rush Landmark Telford Nab., MD;  Location: [facility];  Service: Gastroenterology;;   TEE WITHOUT CARDIOVERSION N/A 01/28/2020   Procedure: TRANSESOPHAGEAL ECHOCARDIOGRAM (TEE);   Surgeon: [name], MD;  Location: [facility];  Service: Cardiovascular;  Laterality: N/A;   TONSILLECTOMY     TOTAL HIP ARTHROPLASTY  2001   left   TOTAL HIP ARTHROPLASTY Right 01/18/2018   Procedure: RIGHT TOTAL HIP ARTHROPLASTY ANTERIOR APPROACH;  Surgeon: [name], MD;  Location: [facility];  Service: Orthopedics;  Laterality: Right;    There were no vitals filed for this visit.   Subjective Assessment - 10/07/21 1244     Subjective No new complaints. No falls or pain to report. States HEP is going well at home.    Pertinent History history of CVA in 01/2020, recent dx of cholangiocarcinoma in July 2022 getting weekly chemo treatments    Limitations Walking;Standing    How long can you stand comfortably? 3-4 mins    How long can you walk comfortably? 5-10 mins    Patient Stated Goals "To not worry about being off balance."    Currently in Pain? No/denies                   [facility] Adult PT Treatment/Exercise - 10/07/21 1246       Transfers   Transfers Sit to Stand;Stand to Sit    Sit to Stand 5: Supervision    Stand to Sit 5: Supervision      Ambulation/Gait   Ambulation/Gait Yes    Ambulation/Gait Assistance 5: Supervision    Ambulation/Gait Assistance Details gait around track working on increased step lenght and increased reciprocal arm swing. pt able to demo when cues provided for a few steps then arm swing decreased again (P)     Ambulation Distance (Feet) 350 Feet   x1, plus around clinic with session   Assistive device None    Gait Pattern Step-through pattern;Decreased arm swing - left;Decreased step length - right;Decreased step length - left    Ambulation Surface Level;Indoor      Knee/Hip Exercises: Aerobic   Nustep Level 3.0 with bil LE's/right UE for 6 minutes with goal >/= 50 steps per minute for strengthening                 Balance Exercises - 10/07/21 1300       Balance Exercises: Standing   Rockerboard  Anterior/posterior;EO;EC;20 seconds;Other reps (comment);Limitations;Intermittent UE support    Rockerboard Limitations in corner on balance board in anterior/posterior direction: rocking the board with emphasis on tall posture with EO, min guard to min assist for balance. Attempted to perform with EC with up to mod assist needed with cues on posture/weight shifting to assist with balance; Then had pt hold the board steady with EC with min/mod assist needed with cues/facilitation for posture/weight shifting for balance assitance.    Sit to Stand Standard surface;Foam/compliant surface;Limitations    Sit to Stand Limitations seated at edge of mat with feet on airex: sit<>stands with single right UE assist for 5 reps with emphasis on tall posture and controlled descent.  PT Short Term Goals - 09/23/21 1156       PT SHORT TERM GOAL #1   Title Pt will be independent with initial HEP in order to build upon functional gains made in therapy. ALL STGS DUE 09/24/21    Baseline Pt reports doing them most days at home    Time 4    Period Weeks    Status Achieved    Target Date 09/24/21      PT SHORT TERM GOAL #2   Title Pt will improve FGA score to at least a 22/30 in order to indicate decr fall risk.    Baseline 18/30 to 21/30    Time 4    Period Weeks    Status Partially Met      PT SHORT TERM GOAL #3   Title Pt will ambulate at least 300' over level surfaces while scanning environment with supervision and no LOB.    Baseline met    Time 4    Period Weeks    Status Achieved      PT SHORT TERM GOAL #4   Title Pt will decr 5x sit <> stand time to 11.5 seconds or less in order to indicate improved functional LE strength.    Baseline 14.75  secs to 13.53 secs without support    Time 4    Period Weeks    Status Partially Met      PT SHORT TERM GOAL #5   Title Pt will improve gait speed to at least 2.6 ft/sec in order to demo improved gait efficiency.    Baseline  2.00 ft/sec to 2.75 ft/sec    Time 4    Period Weeks    Status Achieved               PT Long Term Goals - 08/25/21 1227       PT LONG TERM GOAL #1   Title Pt will be independent with final HEP in order to build upon functional gains made in therapy. ALL LTGS DUE 10/24/21    Time 8    Period Weeks    Status New    Target Date 10/24/21      PT LONG TERM GOAL #2   Title Pt will improve FGA score to at least a 26/30 in order to indicate decr fall risk.    Time 8    Period Weeks    Status New      PT LONG TERM GOAL #3   Title 30 second chair stand goal to be written as appropriate to demo improved LE strength and endurance.    Time 8    Period Weeks    Status New      PT LONG TERM GOAL #4   Title Pt will improve gait speed to at least 3.2 ft/sec in order to demo improved gait efficiency in the community.    Time 8    Period Weeks    Status New      PT LONG TERM GOAL #5   Title Pt will ambulate at least 500' over unlevel surfaces with mod I and scanning environment in order to improve community mobility.    Time 8    Period Weeks    Status New                   Plan - 10/07/21 1246     Clinical Impression Statement Today's skilled session continued to focus on strengthening, gait and balance. No  issues noted or reported in session. The pt is progressing and should benefit from continued PT to progress toward unmet goals.    Personal Factors and Comorbidities Age;Comorbidity 3+    Comorbidities see above    Examination-Activity Limitations Locomotion Level;Squat;Stairs;Stand    Examination-Participation Restrictions Community Activity;Driving    Stability/Clinical Decision Making Evolving/Moderate complexity    Rehab Potential Good    PT Frequency 2x / week    PT Duration 8 weeks    PT Treatment/Interventions ADLs/Self Care Home Management;Gait training;Stair training;Functional mobility training;Therapeutic activities;Therapeutic exercise;Balance  training;Neuromuscular re-education;Patient/family education;Vestibular    PT Next Visit Plan Continue to work on floor transfers as needed . balance on compliant surfaces,  gait training, nustep/scifit    Consulted and Agree with Plan of Care Patient             Patient will benefit from skilled therapeutic intervention in order to improve the following deficits and impairments:  Decreased activity tolerance, Decreased balance, Decreased endurance, Decreased mobility, Decreased strength, Difficulty walking, Impaired perceived functional ability, Postural dysfunction  Visit Diagnosis: Hemiplegia and hemiparesis following cerebral infarction affecting left non-dominant side (HCC)  Muscle weakness (generalized)  Unsteadiness on feet  Other abnormalities of gait and mobility     Problem List Patient Active Problem List   Diagnosis Date Noted   Cholangitis 07/28/2021   SIRS (systemic inflammatory response syndrome) ([place]) 07/10/2021   Obstructive jaundice due to malignant neoplasm ([place]) 06/24/2021   [place]'s tumor ([place]) 06/24/2021   Malnutrition of moderate degree 06/24/2021   Abnormal abdominal CT scan 06/19/2021   Jaundice 06/19/2021   Weight loss, unintentional 06/19/2021   Bile duct obstruction 06/19/2021   Left hemiparesis ([place]) 03/28/2020   Chronic bilateral low back pain without sciatica 02/25/2020   Spasticity as late effect of cerebrovascular accident (CVA) 02/25/2020   Cognitive and neurobehavioral dysfunction    Hypertensive urgency 01/28/2020   Urinary retention 01/28/2020   Right middle cerebral artery stroke ([place]) 01/28/2020   Stroke (cerebrum) ([place]) -  [name] 01/25/2020   Acute respiratory failure with hypoxia (HCC)    Hypotension    Hypokalemia    Hypocalcemia    Encephalopathy acute    Stroke ([place]) 01/24/2020   Middle cerebral artery embolism,  right 01/24/2020   Hyperlipidemia 02/21/2019   Impingement syndrome of right shoulder region 02/21/2018   OA (osteoarthritis) of hip 01/18/2018   Prostate cancer ([place]) 03/04/2016   Essential hypertension 11/27/2014   History of colonic polyps 09/26/2014   BACK PAIN, LEFT 12/22/2009   PROSTATE SPECIFIC ANTIGEN, ELEVATED 12/20/2008   LATERAL EPICONDYLITIS 07/15/2008   ACTINIC KERATOSIS, FOREHEAD, LEFT 05/23/2008   WART, LEFT HAND 07/19/2007   ERECTILE DYSFUNCTION, MILD 07/19/2007   HERNIA, BILATERAL INGUINAL W/O OBST/GANGRENE 07/19/2007   MENISCUS TEAR 07/19/2007   Bilateral inguinal hernia 07/19/2007    Willow Ora, PTA, [facility] [address], [place], [place] [address] [phone] 10/08/21, 7:18 PM   Name: Ruben Chavez MRN: [id number] Date of Birth: 12-25-1950

## 2021-10-09 DIAGNOSIS — Z5111 Encounter for antineoplastic chemotherapy: Secondary | ICD-10-CM | POA: Diagnosis not present

## 2021-10-09 DIAGNOSIS — C221 Intrahepatic bile duct carcinoma: Secondary | ICD-10-CM | POA: Diagnosis not present

## 2021-10-13 ENCOUNTER — Ambulatory Visit: Payer: PPO | Admitting: Rehabilitation

## 2021-10-13 ENCOUNTER — Ambulatory Visit: Payer: PPO | Admitting: Occupational Therapy

## 2021-10-13 DIAGNOSIS — Z4659 Encounter for fitting and adjustment of other gastrointestinal appliance and device: Secondary | ICD-10-CM | POA: Diagnosis not present

## 2021-10-13 DIAGNOSIS — C221 Intrahepatic bile duct carcinoma: Secondary | ICD-10-CM | POA: Diagnosis not present

## 2021-10-13 DIAGNOSIS — K831 Obstruction of bile duct: Secondary | ICD-10-CM | POA: Diagnosis not present

## 2021-10-13 DIAGNOSIS — K838 Other specified diseases of biliary tract: Secondary | ICD-10-CM | POA: Diagnosis not present

## 2021-10-14 ENCOUNTER — Ambulatory Visit: Payer: PPO | Admitting: Physical Therapy

## 2021-10-14 ENCOUNTER — Encounter: Payer: PPO | Admitting: Occupational Therapy

## 2021-10-15 DIAGNOSIS — C221 Intrahepatic bile duct carcinoma: Secondary | ICD-10-CM | POA: Diagnosis not present

## 2021-10-19 ENCOUNTER — Ambulatory Visit: Payer: PPO | Admitting: Physical Medicine and Rehabilitation

## 2021-10-20 ENCOUNTER — Other Ambulatory Visit: Payer: Self-pay | Admitting: Internal Medicine

## 2021-10-20 ENCOUNTER — Encounter: Payer: Self-pay | Admitting: Physical Therapy

## 2021-10-20 ENCOUNTER — Ambulatory Visit: Payer: PPO | Admitting: Physical Therapy

## 2021-10-20 ENCOUNTER — Encounter: Payer: PPO | Admitting: Occupational Therapy

## 2021-10-20 ENCOUNTER — Other Ambulatory Visit: Payer: Self-pay

## 2021-10-20 DIAGNOSIS — R2681 Unsteadiness on feet: Secondary | ICD-10-CM

## 2021-10-20 DIAGNOSIS — I69354 Hemiplegia and hemiparesis following cerebral infarction affecting left non-dominant side: Secondary | ICD-10-CM | POA: Diagnosis not present

## 2021-10-20 DIAGNOSIS — M6281 Muscle weakness (generalized): Secondary | ICD-10-CM

## 2021-10-20 DIAGNOSIS — R2689 Other abnormalities of gait and mobility: Secondary | ICD-10-CM

## 2021-10-21 ENCOUNTER — Ambulatory Visit: Payer: PPO | Admitting: Rehabilitation

## 2021-10-21 ENCOUNTER — Encounter: Payer: PPO | Admitting: Occupational Therapy

## 2021-10-21 NOTE — Therapy (Addendum)
[facility] [address] [place], [place], [address] Phone: [phone]   Fax:  [phone]  Physical Therapy Treatment and Recert   Patient Details  Name: Ruben Chavez MRN: [id number] Date of Birth: July 28, 1951 Referring Provider (PT): Leeann Must, MD [facility] MD)   Encounter Date: 10/20/2021   PT End of Session - 10/20/21 1110     Visit Number 7    Number of Visits 17    Date for PT Re-Evaluation 10/24/21    Authorization Type [employer]    Progress Note Due on Visit 10    PT Start Time 1105    PT Stop Time 1145    PT Time Calculation (min) 40 min    Equipment Utilized During Treatment Gait belt    Activity Tolerance Patient tolerated treatment well    Behavior During Therapy [facility] for tasks assessed/performed             Past Medical History:  Diagnosis Date   Elevated PSA    Hyperlipidemia    pt says not anymore   Hypertension    Prostate cancer ([place]) 2013    Past Surgical History:  Procedure Laterality Date   bilateral inguinal hernia  2008   BILIARY BRUSHING  06/23/2021   Procedure: BILIARY BRUSHING;  Surgeon: Irving Copas., MD;  Location: [facility];  Service: Gastroenterology;;   BILIARY DILATION  06/23/2021   Procedure: BILIARY DILATION;  Surgeon: Irving Copas., MD;  Location: [facility];  Service: Gastroenterology;;   BILIARY STENT PLACEMENT N/A 06/23/2021   Procedure: BILIARY STENT PLACEMENT;  Surgeon: Irving Copas., MD;  Location: [facility];  Service: Gastroenterology;  Laterality: N/A;   BIOPSY  06/23/2021   Procedure: BIOPSY;  Surgeon: Rush Landmark Telford Nab., MD;  Location: [facility];  Service: Gastroenterology;;   Kathleen Argue STUDY  01/28/2020   Procedure: BUBBLE STUDY;  Surgeon: [name], MD;  Location: [facility];  Service: Cardiovascular;;   CYST REMOVAL TRUNK  2016   sebaceous cyst   CYSTOSCOPY  04/22/2016   Procedure:  CYSTOSCOPY;  Surgeon: [name], MD;  Location[facility];  Service: Urology;;   ERCP N/A 06/23/2021   Procedure: ENDOSCOPIC RETROGRADE CHOLANGIOPANCREATOGRAPHY (ERCP);  Surgeon: Irving Copas., MD;  Location: [facility];  Service: Gastroenterology;  Laterality: N/A;   IR ANGIO INTRA EXTRACRAN SEL COM CAROTID INNOMINATE BILAT MOD SED  05/30/2020   IR ANGIO VERTEBRAL SEL SUBCLAVIAN INNOMINATE UNI R MOD SED  01/25/2020   IR ANGIO VERTEBRAL SEL VERTEBRAL BILAT MOD SED  05/30/2020   IR CT HEAD LTD  01/25/2020   IR PERCUTANEOUS ART THROMBECTOMY/INFUSION INTRACRANIAL INC DIAG ANGIO  01/25/2020   IR US GUIDE VASC ACCESS RIGHT  05/30/2020   KNEE ARTHROSCOPY  2007   right   LOOP RECORDER INSERTION N/A 01/28/2020   Procedure: LOOP RECORDER INSERTION;  Surgeon: [name], MD;  Location: [facility];  Service: Cardiovascular;  Laterality: N/A;   PANCREATIC STENT PLACEMENT  06/23/2021   Procedure: PANCREATIC STENT PLACEMENT;  Surgeon: Irving Copas., MD;  Location: [facility];  Service: Gastroenterology;;   PROSTATE BIOPSY  2008   PROSTATE BIOPSY  2013   PROSTATE BIOPSY  01/28/2016   RADIOACTIVE SEED IMPLANT N/A 04/22/2016   Procedure: RADIOACTIVE SEED IMPLANT/BRACHYTHERAPY IMPLANT;  Surgeon: [name], MD;  Location: [facility];  Service: Urology;  Laterality: N/A;   RADIOLOGY WITH ANESTHESIA N/A 01/24/2020   Procedure: IR WITH ANESTHESIA;  Surgeon: [name], MD;  Location: [facility];  Service: Radiology;  Laterality: N/A;   REMOVAL OF STONES  06/23/2021   Procedure: REMOVAL OF STONES;  Surgeon: Rush Landmark Telford Nab., MD;  Location: [facility];  Service: Gastroenterology;;   Joan Mayans  06/23/2021   Procedure: Joan Mayans;  Surgeon: Rush Landmark Telford Nab., MD;  Location: [facility];  Service: Gastroenterology;;   TEE WITHOUT CARDIOVERSION N/A 01/28/2020   Procedure: TRANSESOPHAGEAL ECHOCARDIOGRAM (TEE);  Surgeon:  [name], MD;  Location: [facility];  Service: Cardiovascular;  Laterality: N/A;   TONSILLECTOMY     TOTAL HIP ARTHROPLASTY  2001   left   TOTAL HIP ARTHROPLASTY Right 01/18/2018   Procedure: RIGHT TOTAL HIP ARTHROPLASTY ANTERIOR APPROACH;  Surgeon: [name], MD;  Location: [facility];  Service: Orthopedics;  Laterality: Right;    There were no vitals filed for this visit.   Subjective Assessment - 10/20/21 1110     Subjective No new complaints. No falls or pain to report. Wants to practice floor transfers again.    Pertinent History history of CVA in 01/2020, recent dx of cholangiocarcinoma in July 2022 getting weekly chemo treatments    Limitations Walking;Standing    How long can you stand comfortably? 3-4 mins    How long can you walk comfortably? 5-10 mins    Patient Stated Goals "To not worry about being off balance."    Currently in Pain? No/denies                [facility] PT Assessment - 10/20/21 1114       Functional Gait  Assessment   Gait assessed  Yes    Gait Level Surface Walks 20 ft, slow speed, abnormal gait pattern, evidence for imbalance or deviates 10-15 in outside of the 12 in walkway width. Requires more than 7 sec to ambulate 20 ft.   7.49 sec 's   Change in Gait Speed Able to smoothly change walking speed without loss of balance or gait deviation. Deviate no more than 6 in outside of the 12 in walkway width.    Gait with Horizontal Head Turns Performs head turns smoothly with slight change in gait velocity (eg, minor disruption to smooth gait path), deviates 6-10 in outside 12 in walkway width, or uses an assistive device.    Gait with Vertical Head Turns Performs head turns with no change in gait. Deviates no more than 6 in outside 12 in walkway width.    Gait and Pivot Turn Pivot turns safely within 3 sec and stops quickly with no loss of balance.    Step Over Obstacle Is able to step over 2 stacked shoe boxes taped together (9 in total height)  without changing gait speed. No evidence of imbalance.    Gait with Narrow Base of Support Ambulates less than 4 steps heel to toe or cannot perform without assistance.    Gait with Eyes Closed Walks 20 ft, slow speed, abnormal gait pattern, evidence for imbalance, deviates 10-15 in outside 12 in walkway width. Requires more than 9 sec to ambulate 20 ft.   13.76 sec's   Ambulating Backwards Walks 20 ft, uses assistive device, slower speed, mild gait deviations, deviates 6-10 in outside 12 in walkway width.   17.56 sec's   Steps Alternating feet, must use rail.    Total Score 20    FGA comment: < 19 = high risk fall  [facility] PT Treatment/Exercise - 10/20/21 1114       Transfers   Transfers Sit to Stand;Stand to Sit    Sit to Stand 5: Supervision    Stand to Sit 5: Supervision    Comments 30 sec sit to stand= 10 reps from chair without hands      Ambulation/Gait   Ambulation/Gait Yes    Ambulation/Gait Assistance 5: Supervision    Ambulation/Gait Assistance Details continues to need cues for increased step/stride length and more upright posture with gait.    Ambulation Distance (Feet) --   around clinic with session   Assistive device None    Gait Pattern Step-through pattern;Decreased arm swing - left;Decreased step length - right;Decreased step length - left    Ambulation Surface Level;Indoor    Gait velocity 11.34 sec's= 2.90 ft/sec no AD      Self-Care   Self-Care Other Self-Care Comments    Other Self-Care Comments  reviewed HEP. Pt reports no issues at home. Will benefit from advancement as pt progresses in therapy.                       PT Short Term Goals - 09/23/21 1156       PT SHORT TERM GOAL #1   Title Pt will be independent with initial HEP in order to build upon functional gains made in therapy. ALL STGS DUE 09/24/21    Baseline Pt reports doing them most days at home    Time 4    Period Weeks    Status Achieved     Target Date 09/24/21      PT SHORT TERM GOAL #2   Title Pt will improve FGA score to at least a 22/30 in order to indicate decr fall risk.    Baseline 18/30 to 21/30    Time 4    Period Weeks    Status Partially Met      PT SHORT TERM GOAL #3   Title Pt will ambulate at least 300' over level surfaces while scanning environment with supervision and no LOB.    Baseline met    Time 4    Period Weeks    Status Achieved      PT SHORT TERM GOAL #4   Title Pt will decr 5x sit <> stand time to 11.5 seconds or less in order to indicate improved functional LE strength.    Baseline 14.75  secs to 13.53 secs without support    Time 4    Period Weeks    Status Partially Met      PT SHORT TERM GOAL #5   Title Pt will improve gait speed to at least 2.6 ft/sec in order to demo improved gait efficiency.    Baseline 2.00 ft/sec to 2.75 ft/sec    Time 4    Period Weeks    Status Achieved               PT Long Term Goals - 10/21/21 1414       PT LONG TERM GOAL #1   Title Pt will be independent with final HEP in order to build upon functional gains made in therapy. ALL LTGS DUE 10/24/21    Baseline 10/20/21: met with current program,    Status Achieved      PT LONG TERM GOAL #2   Title Pt will improve FGA score to at least a 26/30 in order to indicate decr fall risk.  Baseline 10/20/21: 20/30 scored today, decreased from baseline score of 26/30    Status Not Met      PT LONG TERM GOAL #3   Title 30 second chair stand goal to be written as appropriate to demo improved LE strength and endurance.    Baseline 10/20/21: 10 reps in 30 seconds, was 8 reps at last assesssment this episode of care with goal not set. goal to be set with recert    Status Partially Met      PT LONG TERM GOAL #4   Title Pt will improve gait speed to at least 3.2 ft/sec in order to demo improved gait efficiency in the community.    Baseline 10/20/21: 2.90 ft/sec no AD    Status Not Met      PT LONG TERM  GOAL #5   Title Pt will ambulate at least 500' over unlevel surfaces with mod I and scanning environment in order to improve community mobility.    Baseline 10/20/21: unable to assess due to weather conditions this ession    Status Unable to assess              PT Short Term Goals - 10/22/21 0829       PT SHORT TERM GOAL #1   Title =LTGs             PT Long Term Goals - 10/22/21 4132       PT LONG TERM GOAL #1   Title Pt will be independent with final HEP in order to build upon functional gains made in therapy. ALL LTGS DUE 11/21/21    Time 4    Period Weeks    Status On-going      PT LONG TERM GOAL #2   Title Pt will improve FGA score to at least a 26/30 in order to indicate decr fall risk.    Baseline 10/20/21: 20/30 scored today, decreased from baseline score of 26/30    Time 4    Period Weeks    Status On-going      PT LONG TERM GOAL #3   Title Pt will perform 15 sit to stands in 30 secs in order to indicate dec fall risk and improved functional strength.    Baseline 10/20/21: 10 reps in 30 seconds, was 8 reps at last assesssment this episode of care with goal not set. goal to be set with recert    Time 4    Period Weeks    Status Revised      PT LONG TERM GOAL #4   Title Pt will improve gait speed to at least 3.2 ft/sec in order to demo improved gait efficiency in the community.    Baseline 10/20/21: 2.90 ft/sec no AD    Time 4    Period Weeks    Status On-going      PT LONG TERM GOAL #5   Title Pt will ambulate at least 500' over unlevel surfaces with mod I and scanning environment in order to improve community mobility.    Baseline 10/20/21: unable to assess due to weather conditions this ession    Time 4    Period Weeks    Status On-going            Recert done by: Cameron Sprang, PT, MPT [facility] [address] [place], [place], [address] Phone: [phone]   Fax:  [phone] 10/22/21, 8:37  AM         Plan -  10/20/21 1111     Clinical Impression Statement Today's skilled session focused on progress toward goals. Pt did show improvement in the 10 meter gait speed, however not to goal level. The pt had a decrease in the FGA score this session comparted to last time it was performed. The pt has met the current HEP goal, however will benefit from updating as he progresses. Remainder of session returned to floor transfer training as pt continues to have trouble remembering the sequence/technique. Cues and assist needed this session with less of these on 2cd rep. Primary PT plans to recert to continue working towards unmet goals.    Personal Factors and Comorbidities Age;Comorbidity 3+    Comorbidities see above    Examination-Activity Limitations Locomotion Level;Squat;Stairs;Stand    Examination-Participation Restrictions Community Activity;Driving    Stability/Clinical Decision Making Evolving/Moderate complexity    Rehab Potential Good    PT Frequency 2x / week    PT Duration 8 weeks    PT Treatment/Interventions ADLs/Self Care Home Management;Gait training;Stair training;Functional mobility training;Therapeutic activities;Therapeutic exercise;Balance training;Neuromuscular re-education;Patient/family education;Vestibular    PT Next Visit Plan Continue to work on floor transfers as needed . balance on compliant surfaces,  gait training, nustep/scifit    Consulted and Agree with Plan of Care Patient             Patient will benefit from skilled therapeutic intervention in order to improve the following deficits and impairments:  Decreased activity tolerance, Decreased balance, Decreased endurance, Decreased mobility, Decreased strength, Difficulty walking, Impaired perceived functional ability, Postural dysfunction  Visit Diagnosis: Hemiplegia and hemiparesis following cerebral infarction affecting left non-dominant side (HCC)  Muscle weakness  (generalized)  Unsteadiness on feet  Other abnormalities of gait and mobility     Problem List Patient Active Problem List   Diagnosis Date Noted   Cholangitis 07/28/2021   SIRS (systemic inflammatory response syndrome) ([place]) 07/10/2021   Obstructive jaundice due to malignant neoplasm ([place]) 06/24/2021   Kl[place]kin's tumor ([place]) 06/24/2021   Malnutrition of moderate degree 06/24/2021   Abnormal abdominal CT scan 06/19/2021   Jaundice 06/19/2021   Weight loss, unintentional 06/19/2021   Bile duct obstruction 06/19/2021   Left hemiparesis ([place]) 03/28/2020   Chronic bilateral low back pain without sciatica 02/25/2020   Spasticity as late effect of cerebrovascular accident (CVA) 02/25/2020   Cognitive and neurobehavioral dysfunction    Hypertensive urgency 01/28/2020   Urinary retention 01/28/2020   Right middle cerebral artery stroke ([place]) 01/28/2020   Stroke (cerebrum) ([place]) -  [name] 01/25/2020   Acute respiratory failure with hypoxia (HCC)    Hypotension    Hypokalemia    Hypocalcemia    Encephalopathy acute    Stroke ([place]) 01/24/2020   Middle cerebral artery embolism, right 01/24/2020   Hyperlipidemia 02/21/2019   Impingement syndrome of right shoulder region 02/21/2018   OA (osteoarthritis) of hip 01/18/2018   Prostate cancer ([place]) 03/04/2016   Essential hypertension 11/27/2014   History of colonic polyps 09/26/2014   BACK PAIN, LEFT 12/22/2009   PROSTATE SPECIFIC ANTIGEN, ELEVATED 12/20/2008   LATERAL EPICONDYLITIS 07/15/2008   ACTINIC KERATOSIS, FOREHEAD, LEFT 05/23/2008   WART, LEFT HAND 07/19/2007   ERECTILE DYSFUNCTION, MILD 07/19/2007   HERNIA, BILATERAL INGUINAL W/O OBST/GANGRENE 07/19/2007   MENISCUS TEAR 07/19/2007   Bilateral inguinal hernia 07/19/2007    Willow Ora, PTA, CLT Outpatient [facility] [address], [address]  [place],  [place] [address] [phone] 10/21/21, 2:26 PM   Name: DAJAUN GOLDRING MRN: [id number] Date of Birth: 1951-06-05

## 2021-10-22 ENCOUNTER — Ambulatory Visit: Payer: PPO | Admitting: Physical Therapy

## 2021-10-22 DIAGNOSIS — C221 Intrahepatic bile duct carcinoma: Secondary | ICD-10-CM | POA: Diagnosis not present

## 2021-10-22 DIAGNOSIS — Z79899 Other long term (current) drug therapy: Secondary | ICD-10-CM | POA: Diagnosis not present

## 2021-10-22 DIAGNOSIS — Z5111 Encounter for antineoplastic chemotherapy: Secondary | ICD-10-CM | POA: Diagnosis not present

## 2021-10-22 NOTE — Addendum Note (Signed)
Addended by: Cameron Sprang A on: 10/22/2021 08:39 AM   Modules accepted: Orders

## 2021-10-23 ENCOUNTER — Encounter: Payer: PPO | Admitting: Physical Medicine and Rehabilitation

## 2021-10-26 ENCOUNTER — Encounter: Payer: PPO | Admitting: Occupational Therapy

## 2021-10-26 ENCOUNTER — Ambulatory Visit: Payer: PPO

## 2021-10-27 ENCOUNTER — Other Ambulatory Visit: Payer: Self-pay

## 2021-10-27 ENCOUNTER — Encounter: Payer: Self-pay | Admitting: Physical Therapy

## 2021-10-27 ENCOUNTER — Encounter: Payer: PPO | Admitting: Occupational Therapy

## 2021-10-27 ENCOUNTER — Ambulatory Visit: Payer: PPO | Admitting: Physical Therapy

## 2021-10-27 DIAGNOSIS — M6281 Muscle weakness (generalized): Secondary | ICD-10-CM

## 2021-10-27 DIAGNOSIS — R2681 Unsteadiness on feet: Secondary | ICD-10-CM

## 2021-10-27 DIAGNOSIS — G8194 Hemiplegia, unspecified affecting left nondominant side: Secondary | ICD-10-CM | POA: Diagnosis not present

## 2021-10-27 DIAGNOSIS — I69354 Hemiplegia and hemiparesis following cerebral infarction affecting left non-dominant side: Secondary | ICD-10-CM | POA: Diagnosis not present

## 2021-10-27 DIAGNOSIS — R2689 Other abnormalities of gait and mobility: Secondary | ICD-10-CM

## 2021-10-27 DIAGNOSIS — C801 Malignant (primary) neoplasm, unspecified: Secondary | ICD-10-CM | POA: Diagnosis not present

## 2021-10-27 NOTE — Therapy (Signed)
[facility] [address] [place], [place], [address] Phone: [phone]   Fax:  [phone]  Physical Therapy Treatment  Patient Details  Name: Ruben Chavez MRN: [id number] Date of Birth: 1951/09/23 Referring Provider (PT): Leeann Must, MD [facility] MD)   Encounter Date: 10/27/2021   PT End of Session - 10/27/21 1021     Visit Number 8    Number of Visits 17    Date for PT Re-Evaluation 11/21/21   per updated POC   Authorization Type [employer]    Progress Note Due on Visit 10    PT Start Time 1017    PT Stop Time 1100    PT Time Calculation (min) 43 min    Equipment Utilized During Treatment Gait belt    Activity Tolerance Patient tolerated treatment well    Behavior During Therapy [facility] for tasks assessed/performed             Past Medical History:  Diagnosis Date   Elevated PSA    Hyperlipidemia    pt says not anymore   Hypertension    Prostate cancer ([place]) 2013    Past Surgical History:  Procedure Laterality Date   bilateral inguinal hernia  2008   BILIARY BRUSHING  06/23/2021   Procedure: BILIARY BRUSHING;  Surgeon: Irving Copas., MD;  Location: [facility];  Service: Gastroenterology;;   BILIARY DILATION  06/23/2021   Procedure: BILIARY DILATION;  Surgeon: Irving Copas., MD;  Location: [facility];  Service: Gastroenterology;;   BILIARY STENT PLACEMENT N/A 06/23/2021   Procedure: BILIARY STENT PLACEMENT;  Surgeon: Irving Copas., MD;  Location: [facility];  Service: Gastroenterology;  Laterality: N/A;   BIOPSY  06/23/2021   Procedure: BIOPSY;  Surgeon: Rush Landmark Telford Nab., MD;  Location: [facility];  Service: Gastroenterology;;   Kathleen Argue STUDY  01/28/2020   Procedure: BUBBLE STUDY;  Surgeon: [name], MD;  Location: [facility];  Service: Cardiovascular;;   CYST REMOVAL TRUNK  2016   sebaceous cyst   CYSTOSCOPY  04/22/2016   Procedure:  CYSTOSCOPY;  Surgeon: [name], MD;  Location: [facility];  Service: Urology;;   ERCP N/A 06/23/2021   Procedure: ENDOSCOPIC RETROGRADE CHOLANGIOPANCREATOGRAPHY (ERCP);  Surgeon: Irving Copas., MD;  Location: [facility];  Service: Gastroenterology;  Laterality: N/A;   IR ANGIO INTRA EXTRACRAN SEL COM CAROTID INNOMINATE BILAT MOD SED  05/30/2020   IR ANGIO VERTEBRAL SEL SUBCLAVIAN INNOMINATE UNI R MOD SED  01/25/2020   IR ANGIO VERTEBRAL SEL VERTEBRAL BILAT MOD SED  05/30/2020   IR CT HEAD LTD  01/25/2020   IR PERCUTANEOUS ART THROMBECTOMY/INFUSION INTRACRANIAL INC DIAG ANGIO  01/25/2020   IR US GUIDE VASC ACCESS RIGHT  05/30/2020   KNEE ARTHROSCOPY  2007   right   LOOP RECORDER INSERTION N/A 01/28/2020   Procedure: LOOP RECORDER INSERTION;  Surgeon: [name], MD;  Location: [facility];  Service: Cardiovascular;  Laterality: N/A;   PANCREATIC STENT PLACEMENT  06/23/2021   Procedure: PANCREATIC STENT PLACEMENT;  Surgeon: Irving Copas., MD;  Location: [facility];  Service: Gastroenterology;;   PROSTATE BIOPSY  2008   PROSTATE BIOPSY  2013   PROSTATE BIOPSY  01/28/2016   RADIOACTIVE SEED IMPLANT N/A 04/22/2016   Procedure: RADIOACTIVE SEED IMPLANT/BRACHYTHERAPY IMPLANT;  Surgeon: [name], MD;  Location: [facility];  Service: Urology;  Laterality: N/A;   RADIOLOGY WITH ANESTHESIA N/A 01/24/2020   Procedure: IR WITH  ANESTHESIA;  Surgeon: [name], MD;  Location: [facility];  Service: Radiology;  Laterality: N/A;   REMOVAL OF STONES  06/23/2021   Procedure: REMOVAL OF STONES;  Surgeon: Rush Landmark Telford Nab., MD;  Location: [facility];  Service: Gastroenterology;;   Joan Mayans  06/23/2021   Procedure: Joan Mayans;  Surgeon: Rush Landmark Telford Nab., MD;  [facility];  Service: Gastroenterology;;   TEE WITHOUT CARDIOVERSION N/A 01/28/2020   Procedure: TRANSESOPHAGEAL ECHOCARDIOGRAM (TEE);  Surgeon:  [name], MD;  Location: [facility];  Service: Cardiovascular;  Laterality: N/A;   TONSILLECTOMY     TOTAL HIP ARTHROPLASTY  2001   left   TOTAL HIP ARTHROPLASTY Right 01/18/2018   Procedure: RIGHT TOTAL HIP ARTHROPLASTY ANTERIOR APPROACH;  Surgeon: [name], MD;  [facility]: [facility];  Service: Orthopedics;  Laterality: Right;    There were no vitals filed for this visit.   Subjective Assessment - 10/27/21 1020     Subjective No new complaints. No falls or pain to report. "usual back discomfort"    Pertinent History history of CVA in 01/2020, recent dx of cholangiocarcinoma in July 2022 getting weekly chemo treatments    Limitations Walking;Standing    How long can you stand comfortably? 3-4 mins    How long can you walk comfortably? 5-10 mins    Patient Stated Goals "To not worry about being off balance."    Currently in Pain? No/denies                      [facility] Adult PT Treatment/Exercise - 10/27/21 1022       Transfers   Transfers Sit to Stand;Stand to Sit    Sit to Stand 5: Supervision    Stand to Sit 5: Supervision      Ambulation/Gait   Ambulation/Gait Yes    Ambulation/Gait Assistance 5: Supervision;4: Min guard    Ambulation/Gait Assistance Details continued to work on increased step length and reciprocal arm swing left>right. used walking poles for 2 laps working on arm swing.    Ambulation Distance (Feet) 230 Feet   x 2, plus around clinic with session   Assistive device None    Gait Pattern Step-through pattern;Decreased arm swing - left;Decreased step length - right;Decreased step length - left    Ambulation Surface Level;Indoor      Knee/Hip Exercises: Aerobic   Nustep Level 3.0 with bil LE's/right UE for 8 minutes with goal >/= 50 steps per minute for strengthening                 Balance Exercises - 10/27/21 1051       Balance Exercises: Standing   Rockerboard Anterior/posterior;Lateral;EO;EC;30 seconds;Other reps  (comment);Intermittent UE support;Limitations    Rockerboard Limitations performed both ways with intermittent touch to bars for balance: rocking the board with emphasis on tall posture with EO, progressing to EC with min to mod assist for balance. cues needed on weight shifitng, movements at ankles with keeping shoulder over hips; then had pt work to [employer] steady with EC 30 sec's x 3 reps with min guard to min assist. with each balance loss pt cued to state which way he was leaning and cues on weight shifitng to correct balance. pt needed step by step instructions/cues for task being performed and balance corrections with each rep performed.    Sit to Stand Standard surface;Foam/compliant surface;Limitations    Sit to Stand Limitations seated at edge of mat with feet on airex: sit<>stands with single  right UE assist for 10 reps with emphasis on tall posture and controlled descent.                  PT Short Term Goals - 10/22/21 0829       PT SHORT TERM GOAL #1   Title =LTGs               PT Long Term Goals - 10/22/21 9563       PT LONG TERM GOAL #1   Title Pt will be independent with final HEP in order to build upon functional gains made in therapy. ALL LTGS DUE 11/21/21    Time 4    Period Weeks    Status On-going      PT LONG TERM GOAL #2   Title Pt will improve FGA score to at least a 26/30 in order to indicate decr fall risk.    Baseline 10/20/21: 20/30 scored today, decreased from baseline score of 26/30    Time 4    Period Weeks    Status On-going      PT LONG TERM GOAL #3   Title Pt will perform 15 sit to stands in 30 secs in order to indicate dec fall risk and improved functional strength.    Baseline 10/20/21: 10 reps in 30 seconds, was 8 reps at last assesssment this episode of care with goal not set. goal to be set with recert    Time 4    Period Weeks    Status Revised      PT LONG TERM GOAL #4   Title Pt will improve gait speed to at least 3.2  ft/sec in order to demo improved gait efficiency in the community.    Baseline 10/20/21: 2.90 ft/sec no AD    Time 4    Period Weeks    Status On-going      PT LONG TERM GOAL #5   Title Pt will ambulate at least 500' over unlevel surfaces with mod I and scanning environment in order to improve community mobility.    Baseline 10/20/21: unable to assess due to weather conditions this ession    Time 4    Period Weeks    Status On-going                   Plan - 10/27/21 1022     Clinical Impression Statement Today's skilled session continued to focus on strengthening, gait training and balance reaction training with no issues noted or reported in session. Continues to need direct, step by step instructions with tasks being performed. Minimal carryover for increased arm swing/step length with gait unless cues provided. The pt is making steady progress and should benefit from continued PT to progress toward unmet goals.    Personal Factors and Comorbidities Age;Comorbidity 3+    Comorbidities see above    Examination-Activity Limitations Locomotion Level;Squat;Stairs;Stand    Examination-Participation Restrictions Community Activity;Driving    Stability/Clinical Decision Making Evolving/Moderate complexity    Rehab Potential Good    PT Frequency 2x / week    PT Duration 4 weeks    PT Treatment/Interventions ADLs/Self Care Home Management;Gait training;Stair training;Functional mobility training;Therapeutic activities;Therapeutic exercise;Balance training;Neuromuscular re-education;Patient/family education;Vestibular    PT Next Visit Plan Continue to work on floor transfers as needed . balance on compliant surfaces,  gait training, nustep/scifit    Consulted and Agree with Plan of Care Patient             Patient will  benefit from skilled therapeutic intervention in order to improve the following deficits and impairments:  Decreased activity tolerance, Decreased balance,  Decreased endurance, Decreased mobility, Decreased strength, Difficulty walking, Impaired perceived functional ability, Postural dysfunction  Visit Diagnosis: Hemiplegia and hemiparesis following cerebral infarction affecting left non-dominant side (HCC)  Muscle weakness (generalized)  Unsteadiness on feet  Other abnormalities of gait and mobility     Problem List Patient Active Problem List   Diagnosis Date Noted   Cholangitis 07/28/2021   SIRS (systemic inflammatory response syndrome) ([place]) 07/10/2021   Obstructive jaundice due to malignant neoplasm ([place]) 06/24/2021   Klatskin's tumor ([place]) 06/24/2021   Malnutrition of moderate degree 06/24/2021   Abnormal abdominal CT scan 06/19/2021   Jaundice 06/19/2021   Weight loss, unintentional 06/19/2021   Bile duct obstruction 06/19/2021   Left hemiparesis ([place]) 03/28/2020   Chronic bilateral low back pain without sciatica 02/25/2020   Spasticity as late effect of cerebrovascular accident (CVA) 02/25/2020   Cognitive and neurobehavioral dysfunction    Hypertensive urgency 01/28/2020   Urinary retention 01/28/2020   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/28/2020   Stroke (cerebrum) ([place]) -  [name] 01/25/2020   Acute respiratory failure with hypoxia (HCC)    Hypotension    Hypokalemia    Hypocalcemia    Encephalopathy acute    Stroke ([place]) 01/24/2020   Middle cerebral artery embolism, right 01/24/2020   Hyperlipidemia 02/21/2019   Impingement syndrome of right shoulder region 02/21/2018   OA (osteoarthritis) of hip 01/18/2018   Prostate cancer ([place]) 03/04/2016   Essential hypertension 11/27/2014   History of colonic polyps 09/26/2014   BACK PAIN, LEFT 12/22/2009   PROSTATE SPECIFIC ANTIGEN, ELEVATED 12/20/2008   LATERAL EPICONDYLITIS 07/15/2008   ACTINIC KERATOSIS, FOREHEAD, LEFT 05/23/2008   WART, LEFT HAND  07/19/2007   ERECTILE DYSFUNCTION, MILD 07/19/2007   HERNIA, BILATERAL INGUINAL W/O OBST/GANGRENE 07/19/2007   MENISCUS TEAR 07/19/2007   Bilateral inguinal hernia 07/19/2007    Willow Ora, PTA, [facility] [address] [place], [place] [address] [phone] 10/27/21, 1:14 PM   Name: Ruben Chavez MRN: [id number] Date of Birth: 1951/03/12

## 2021-11-02 DIAGNOSIS — C221 Intrahepatic bile duct carcinoma: Secondary | ICD-10-CM | POA: Diagnosis not present

## 2021-11-02 DIAGNOSIS — K828 Other specified diseases of gallbladder: Secondary | ICD-10-CM | POA: Diagnosis not present

## 2021-11-02 DIAGNOSIS — R911 Solitary pulmonary nodule: Secondary | ICD-10-CM | POA: Diagnosis not present

## 2021-11-02 DIAGNOSIS — K838 Other specified diseases of biliary tract: Secondary | ICD-10-CM | POA: Diagnosis not present

## 2021-11-02 DIAGNOSIS — Z9689 Presence of other specified functional implants: Secondary | ICD-10-CM | POA: Diagnosis not present

## 2021-11-03 ENCOUNTER — Encounter: Payer: Self-pay | Admitting: Internal Medicine

## 2021-11-03 ENCOUNTER — Ambulatory Visit: Payer: PPO | Admitting: Rehabilitation

## 2021-11-03 ENCOUNTER — Ambulatory Visit (INDEPENDENT_AMBULATORY_CARE_PROVIDER_SITE_OTHER): Payer: PPO | Admitting: Internal Medicine

## 2021-11-03 ENCOUNTER — Encounter: Payer: Self-pay | Admitting: Rehabilitation

## 2021-11-03 VITALS — BP 120/70 | HR 76 | Temp 98.3°F | Ht 69.5 in | Wt 143.1 lb

## 2021-11-03 DIAGNOSIS — I10 Essential (primary) hypertension: Secondary | ICD-10-CM | POA: Diagnosis not present

## 2021-11-03 DIAGNOSIS — G8194 Hemiplegia, unspecified affecting left nondominant side: Secondary | ICD-10-CM

## 2021-11-03 DIAGNOSIS — R2689 Other abnormalities of gait and mobility: Secondary | ICD-10-CM

## 2021-11-03 DIAGNOSIS — M6281 Muscle weakness (generalized): Secondary | ICD-10-CM

## 2021-11-03 DIAGNOSIS — C221 Intrahepatic bile duct carcinoma: Secondary | ICD-10-CM | POA: Diagnosis not present

## 2021-11-03 DIAGNOSIS — I69354 Hemiplegia and hemiparesis following cerebral infarction affecting left non-dominant side: Secondary | ICD-10-CM

## 2021-11-03 DIAGNOSIS — R2681 Unsteadiness on feet: Secondary | ICD-10-CM

## 2021-11-03 DIAGNOSIS — E785 Hyperlipidemia, unspecified: Secondary | ICD-10-CM

## 2021-11-03 DIAGNOSIS — C61 Malignant neoplasm of prostate: Secondary | ICD-10-CM

## 2021-11-03 MED ORDER — BACLOFEN 5 MG PO TABS: ORAL_TABLET | ORAL | 1 refills | Status: DC

## 2021-11-03 NOTE — Therapy (Signed)
[facility] [address] [place], [place], [address] Phone: [phone]   Fax:  [phone]  Physical Therapy Treatment  Patient Details  Name: Ruben Chavez MRN: [id number] Date of Birth: [birth year] Referring Provider (PT): Leeann Must, MD [facility] MD)   Encounter Date: 11/03/2021   PT End of Session - 11/03/21 1204     Visit Number 9    Number of Visits 17    Date for PT Re-Evaluation 11/21/21   per updated POC   Authorization Type [employer]    Progress Note Due on Visit 10    PT Start Time 1104    PT Stop Time 1145    PT Time Calculation (min) 41 min    Equipment Utilized During Treatment Gait belt    Activity Tolerance Patient tolerated treatment well    Behavior During Therapy [facility] for tasks assessed/performed             Past Medical History:  Diagnosis Date   Elevated PSA    Hyperlipidemia    pt says not anymore   Hypertension    Prostate cancer ([place]) 2013    Past Surgical History:  Procedure Laterality Date   bilateral inguinal hernia  2008   BILIARY BRUSHING  06/23/2021   Procedure: BILIARY BRUSHING;  Surgeon: Irving Copas., MD;  Location: [facility];  Service: Gastroenterology;;   BILIARY DILATION  06/23/2021   Procedure: BILIARY DILATION;  Surgeon: Irving Copas., MD;  Location: [facility];  Service: Gastroenterology;;   BILIARY STENT PLACEMENT N/A 06/23/2021   Procedure: BILIARY STENT PLACEMENT;  Surgeon: Irving Copas., MD;  Location: [facility];  Service: Gastroenterology;  Laterality: N/A;   BIOPSY  06/23/2021   Procedure: BIOPSY;  Surgeon: Rush Landmark Telford Nab., MD;  Location: [facility];  Service: Gastroenterology;;   Kathleen Argue STUDY  01/28/2020   Procedure: BUBBLE STUDY;  Surgeon: [name], MD;  Location: [facility];  Service: Cardiovascular;;   CYST REMOVAL TRUNK  2016   sebaceous cyst   CYSTOSCOPY  04/22/2016   Procedure:  CYSTOSCOPY;  Surgeon: [name], MD;  Location: [facility];  Service: Urology;;   ERCP N/A 06/23/2021   Procedure: ENDOSCOPIC RETROGRADE CHOLANGIOPANCREATOGRAPHY (ERCP);  Surgeon: Irving Copas., MD;  Location: [facility];  Service: Gastroenterology;  Laterality: N/A;   IR ANGIO INTRA EXTRACRAN SEL COM CAROTID INNOMINATE BILAT MOD SED  05/30/2020   IR ANGIO VERTEBRAL SEL SUBCLAVIAN INNOMINATE UNI R MOD SED  01/25/2020   IR ANGIO VERTEBRAL SEL VERTEBRAL BILAT MOD SED  05/30/2020   IR CT HEAD LTD  01/25/2020   IR PERCUTANEOUS ART THROMBECTOMY/INFUSION INTRACRANIAL INC DIAG ANGIO  01/25/2020   IR US GUIDE VASC ACCESS RIGHT  05/30/2020   KNEE ARTHROSCOPY  2007   right   LOOP RECORDER INSERTION N/A 01/28/2020   Procedure: LOOP RECORDER INSERTION;  Surgeon: [name], MD;  Location: [facility];  Service: Cardiovascular;  Laterality: N/A;   PANCREATIC STENT PLACEMENT  06/23/2021   Procedure: PANCREATIC STENT PLACEMENT;  Surgeon: Irving Copas., MD;  [facility];  Service: Gastroenterology;;   PROSTATE BIOPSY  2008   PROSTATE BIOPSY  2013   PROSTATE BIOPSY  01/28/2016   RADIOACTIVE SEED IMPLANT N/A 04/22/2016   Procedure: RADIOACTIVE SEED IMPLANT/BRACHYTHERAPY IMPLANT;  Surgeon: [name], MD;  Location: [facility];  Service: Urology;  Laterality: N/A;   RADIOLOGY WITH ANESTHESIA N/A 01/24/2020   Procedure: IR WITH  ANESTHESIA;  Surgeon: [name], MD;  Location: [facility];  Service: Radiology;  Laterality: N/A;   REMOVAL OF STONES  06/23/2021   Procedure: REMOVAL OF STONES;  Surgeon: Rush Landmark Telford Nab., MD;  Location: [facility];  Service: Gastroenterology;;   Joan Mayans  06/23/2021   Procedure: Joan Mayans;  Surgeon: Rush Landmark Telford Nab., MD;  [facility];  Service: Gastroenterology;;   TEE WITHOUT CARDIOVERSION N/A 01/28/2020   Procedure: TRANSESOPHAGEAL ECHOCARDIOGRAM (TEE);  Surgeon:  [name], MD;  Location: [facility];  Service: Cardiovascular;  Laterality: N/A;   TONSILLECTOMY     TOTAL HIP ARTHROPLASTY  2001   left   TOTAL HIP ARTHROPLASTY Right 01/18/2018   Procedure: RIGHT TOTAL HIP ARTHROPLASTY ANTERIOR APPROACH;  Surgeon: [name], MD;  [facility];  Service: Orthopedics;  Laterality: Right;    There were no vitals filed for this visit.   Subjective Assessment - 11/03/21 1204     Subjective "Is this my last visit?"    Pertinent History history of CVA in 01/2020, recent dx of cholangiocarcinoma in July 2022 getting weekly chemo treatments    Patient Stated Goals "To not worry about being off balance."    Currently in Pain? No/denies                               [facility] Adult PT Treatment/Exercise - 11/03/21 1137       Bed Mobility   Bed Mobility Sitting - Scoot to Edge of Bed      Transfers   Transfers Sit to Stand;Stand to Sit    Sit to Stand 5: Supervision    Stand to Sit 5: Supervision      Ambulation/Gait   Ambulation/Gait Yes    Ambulation/Gait Assistance 5: Supervision    Ambulation/Gait Assistance Details Pt continues to have marked difficulties carrying over increased stride length and increased arm swing.  Despite cuing throughout, he shows very little improvement during session.    Ambulation Distance (Feet) 230 Feet   x 2 reps   Assistive device None    Gait Pattern Step-through pattern;Decreased arm swing - left;Decreased step length - right;Decreased step length - left    Ambulation Surface Level;Indoor      Self-Care   Self-Care Other Self-Care Comments    Other Self-Care Comments  Pt continues to be very perseverative on schedule and getting all appts in phone during session despite getting handout for pt during session.  Rescheduled last visit for DC.      Neuro Re-ed    Neuro Re-ed Details  continue to work on tasks to carryover to improve stride length with forward stepping over balance beam  x 10 reps (working to increase speed as we progress), lateral stepping over beam x 10 reps each direction both with single UE support.  Then worked on more reactionary balance tasks stepping to targets called out by therapist, including crossing midline.  Pt with difficulty at times following verbal cues, but did better with repetition.      Knee/Hip Exercises: Aerobic   Nustep [employer] level 4 resistance with BUEs/LE (LUE needing to be held in place with ace wrap).  Cues to maintain rpms in the 60-70's throughout.  Pt very distracted by scheduling issues and needing to reschedule last visit.                       PT Short Term Goals - 10/22/21  0829       PT SHORT TERM GOAL #1   Title =LTGs               PT Long Term Goals - 10/22/21 9381       PT LONG TERM GOAL #1   Title Pt will be independent with final HEP in order to build upon functional gains made in therapy. ALL LTGS DUE 11/21/21    Time 4    Period Weeks    Status On-going      PT LONG TERM GOAL #2   Title Pt will improve FGA score to at least a 26/30 in order to indicate decr fall risk.    Baseline 10/20/21: 20/30 scored today, decreased from baseline score of 26/30    Time 4    Period Weeks    Status On-going      PT LONG TERM GOAL #3   Title Pt will perform 15 sit to stands in 30 secs in order to indicate dec fall risk and improved functional strength.    Baseline 10/20/21: 10 reps in 30 seconds, was 8 reps at last assesssment this episode of care with goal not set. goal to be set with recert    Time 4    Period Weeks    Status Revised      PT LONG TERM GOAL #4   Title Pt will improve gait speed to at least 3.2 ft/sec in order to demo improved gait efficiency in the community.    Baseline 10/20/21: 2.90 ft/sec no AD    Time 4    Period Weeks    Status On-going      PT LONG TERM GOAL #5   Title Pt will ambulate at least 500' over unlevel surfaces with mod I and scanning environment in order to  improve community mobility.    Baseline 10/20/21: unable to assess due to weather conditions this ession    Time 4    Period Weeks    Status On-going                   Plan - 11/03/21 1205     Clinical Impression Statement Pt perseverative on schedule during session and difficult to redirect until his remaining visits were in his phone.  Remaining session continues to focus on balance with stepping strategy and crossing midline, along with tasks to carryover to improved gait pattern.  Pt shows little carryover during gait today.    Personal Factors and Comorbidities Age;Comorbidity 3+    Comorbidities see above    Examination-Activity Limitations Locomotion Level;Squat;Stairs;Stand    Examination-Participation Restrictions Community Activity;Driving    Stability/Clinical Decision Making Evolving/Moderate complexity    Rehab Potential Good    PT Frequency 2x / week    PT Duration 4 weeks    PT Treatment/Interventions ADLs/Self Care Home Management;Gait training;Stair training;Functional mobility training;Therapeutic activities;Therapeutic exercise;Balance training;Neuromuscular re-education;Patient/family education;Vestibular    PT Next Visit Plan PN!! Continue to work on floor transfers as needed . balance on compliant surfaces,  gait training, nustep/scifit    PT Home Exercise Plan Brooke-You will just be doing his goals and send me D/C! I had to reschedule one of his visits and it needed to be within his POC.    Consulted and Agree with Plan of Care Patient             Patient will benefit from skilled therapeutic intervention in order to improve the following deficits and impairments:  Decreased  activity tolerance, Decreased balance, Decreased endurance, Decreased mobility, Decreased strength, Difficulty walking, Impaired perceived functional ability, Postural dysfunction  Visit Diagnosis: Hemiplegia and hemiparesis following cerebral infarction affecting left  non-dominant side (HCC)  Muscle weakness (generalized)  Unsteadiness on feet  Other abnormalities of gait and mobility     Problem List Patient Active Problem List   Diagnosis Date Noted   Cholangitis 07/28/2021   SIRS (systemic inflammatory response syndrome) ([place]) 07/10/2021   Obstructive jaundice due to malignant neoplasm ([place]) 06/24/2021   Klatskin's tumor ([place]) 06/24/2021   Malnutrition of moderate degree 06/24/2021   Abnormal abdominal CT scan 06/19/2021   Jaundice 06/19/2021   Weight loss, unintentional 06/19/2021   Bile duct obstruction 06/19/2021   Left hemiparesis ([place]) 03/28/2020   Chronic bilateral low back pain without sciatica 02/25/2020   Spasticity as late effect of cerebrovascular accident (CVA) 02/25/2020   Cognitive and neurobehavioral dysfunction    Hypertensive urgency 01/28/2020   Urinary retention 01/28/2020   Right middle cerebral artery stroke ([place]) 01/28/2020   Stroke (cerebrum) ([place]) -  [name] 01/25/2020   Acute respiratory failure with hypoxia (HCC)    Hypotension    Hypokalemia    Hypocalcemia    Encephalopathy acute    Stroke ([place]) 01/24/2020   Middle cerebral artery embolism, right 01/24/2020   Hyperlipidemia 02/21/2019   Impingement syndrome of right shoulder region 02/21/2018   OA (osteoarthritis) of hip 01/18/2018   Prostate cancer ([place]) 03/04/2016   Essential hypertension 11/27/2014   History of colonic polyps 09/26/2014   BACK PAIN, LEFT 12/22/2009   PROSTATE SPECIFIC ANTIGEN, ELEVATED 12/20/2008   LATERAL EPICONDYLITIS 07/15/2008   ACTINIC KERATOSIS, FOREHEAD, LEFT 05/23/2008   WART, LEFT HAND 07/19/2007   ERECTILE DYSFUNCTION, MILD 07/19/2007   HERNIA, BILATERAL INGUINAL W/O OBST/GANGRENE 07/19/2007   MENISCUS TEAR 07/19/2007   Bilateral inguinal hernia 07/19/2007    Cameron Sprang, PT, MPT [facility] [address] [place], [place], [address] Phone: [phone]   Fax:  [phone] 11/03/21, 12:09 PM   Name: TAMMY ERICSSON MRN: [id number] Date of Birth: 1950-12-13

## 2021-11-03 NOTE — Progress Notes (Signed)
Established Patient Office Visit     This visit occurred during the SARS-CoV-2 public health emergency.  Safety protocols were in place, including screening questions prior to the visit, additional usage of staff PPE, and extensive cleaning of exam room while observing appropriate contact time as indicated for disinfecting solutions.    CC/Reason for Visit: Annual physical  HPI: Ruben Chavez is a 70 y.o. male who is coming in today for the above mentioned reasons.  He initially scheduled this visit as his physical, however he has already had 1 this calendar year so we have converted this to a follow-up.  He has a history of cholangiocarcinoma that was diagnosed earlier this year with ongoing treatment at [facility].  He recently had a follow-up CT scan and will discuss this with his oncologist during his upcoming appointment this week.  He also has a history of hypertension, hyperlipidemia, prior CVA with resultant left hemiparesis and prostate cancer.  He is requesting a refill of baclofen that he takes for muscle spasms as a result of his CVA.  All immunizations are up-to-date.  Past Medical/Surgical History: Past Medical History:  Diagnosis Date   Elevated PSA    Hyperlipidemia    pt says not anymore   Hypertension    Prostate cancer ([place]) 2013    Past Surgical History:  Procedure Laterality Date   bilateral inguinal hernia  2008   BILIARY BRUSHING  06/23/2021   Procedure: BILIARY BRUSHING;  Surgeon: Irving Copas., MD;  Location: [facility];  Service: Gastroenterology;;   BILIARY DILATION  06/23/2021   Procedure: BILIARY DILATION;  Surgeon: Irving Copas., MD;  Location: [facility];  Service: Gastroenterology;;   BILIARY STENT PLACEMENT N/A 06/23/2021   Procedure: BILIARY STENT PLACEMENT;  Surgeon: Irving Copas., MD;  Location: [facility];  Service: Gastroenterology;  Laterality: N/A;   BIOPSY  06/23/2021   Procedure: BIOPSY;  Surgeon:  Rush Landmark Telford Nab., MD;  Location: [facility];  Service: Gastroenterology;;   Kathleen Argue STUDY  01/28/2020   Procedure: BUBBLE STUDY;  Surgeon: [name], MD;  Location: [place];  Service: Cardiovascular;;   CYST REMOVAL TRUNK  2016   sebaceous cyst   CYSTOSCOPY  04/22/2016   Procedure: CYSTOSCOPY;  Surgeon: [name], MD;  Location[facility];  Service: Urology;;   ERCP N/A 06/23/2021   Procedure: ENDOSCOPIC RETROGRADE CHOLANGIOPANCREATOGRAPHY (ERCP);  Surgeon: Irving Copas., MD;  Location: [facility];  Service: Gastroenterology;  Laterality: N/A;   IR ANGIO INTRA EXTRACRAN SEL COM CAROTID INNOMINATE BILAT MOD SED  05/30/2020   IR ANGIO VERTEBRAL SEL SUBCLAVIAN INNOMINATE UNI R MOD SED  01/25/2020   IR ANGIO VERTEBRAL SEL VERTEBRAL BILAT MOD SED  05/30/2020   IR CT HEAD LTD  01/25/2020   IR PERCUTANEOUS ART THROMBECTOMY/INFUSION INTRACRANIAL INC DIAG ANGIO  01/25/2020   IR US GUIDE VASC ACCESS RIGHT  05/30/2020   KNEE ARTHROSCOPY  2007   right   LOOP RECORDER INSERTION N/A 01/28/2020   Procedure: LOOP RECORDER INSERTION;  Surgeon: [name], MD;  Location: [place] [facility];  Service: Cardiovascular;  Laterality: N/A;   PANCREATIC STENT PLACEMENT  06/23/2021   Procedure: PANCREATIC STENT PLACEMENT;  Surgeon: Irving Copas., MD;  Location: [facility];  Service: Gastroenterology;;   PROSTATE BIOPSY  2008   PROSTATE BIOPSY  2013   PROSTATE BIOPSY  01/28/2016   RADIOACTIVE SEED IMPLANT N/A 04/22/2016   Procedure: RADIOACTIVE SEED IMPLANT/BRACHYTHERAPY IMPLANT;  Surgeon:  [name], MD;  Location: [facility];  Service: Urology;  Laterality: N/A;   RADIOLOGY WITH ANESTHESIA N/A 01/24/2020   Procedure: IR WITH ANESTHESIA;  Surgeon: [name], MD;  Location: [place];  Service: Radiology;  Laterality: N/A;   REMOVAL OF STONES  06/23/2021   Procedure: REMOVAL OF STONES;  Surgeon: Rush Landmark Telford Nab., MD;   Location: [facility];  Service: Gastroenterology;;   Joan Mayans  06/23/2021   Procedure: Joan Mayans;  Surgeon: Rush Landmark Telford Nab., MD;  Location: [facility];  Service: Gastroenterology;;   TEE WITHOUT CARDIOVERSION N/A 01/28/2020   Procedure: TRANSESOPHAGEAL ECHOCARDIOGRAM (TEE);  Surgeon: [name], MD;  Location: [facility];  Service: Cardiovascular;  Laterality: N/A;   TONSILLECTOMY     TOTAL HIP ARTHROPLASTY  2001   left   TOTAL HIP ARTHROPLASTY Right 01/18/2018   Procedure: RIGHT TOTAL HIP ARTHROPLASTY ANTERIOR APPROACH;  Surgeon: [name], MD;  [facility]: [facility];  Service: Orthopedics;  Laterality: Right;    Social History:  reports that he quit smoking about 22 years ago. His smoking use included cigarettes. He has a 32.00 pack-year smoking history. He has never used smokeless tobacco. He reports that he does not currently use alcohol after a past usage of about 14.0 standard drinks per week. He reports that he does not use drugs.  Allergies: Allergies  Allergen Reactions   Sulfa Antibiotics Swelling and Other (See Comments)    Swelling in ankles  Swelling in ankles   Advil [Ibuprofen] Other (See Comments)    "Dots on chest" (Petechiae)   Aleve [Naproxen] Other (See Comments)    "Dots on chest" (Petechiae)   Betadine [Povidone Iodine] Itching and Rash   Chloroxylenol (Antiseptic) Itching, Rash and Other (See Comments)    PCMX surgical sterilizing scrub   Poison Ivy Extract Rash   Povidone-Iodine Itching and Rash    BETADINE    Family History:  Family History  Problem Relation Age of Onset   Pancreatic cancer Mother        small bowel cancer   Heart disease Father    Colon cancer Neg Hx    Stomach cancer Neg Hx      Current Outpatient Medications:    ASPIRIN LOW DOSE 81 MG EC tablet, Take 81 mg by mouth daily., Disp: , Rfl:    buPROPion (WELLBUTRIN XL) 150 MG 24 hr tablet, Take 150 mg by mouth daily as needed., Disp: , Rfl:    folic  acid (FOLVITE) 1 MG tablet, Take 1 mg by mouth daily., Disp: , Rfl:    KRILL OIL PO, Take 1 tablet by mouth daily., Disp: , Rfl:    lidocaine (XYLOCAINE) 5 % ointment, Apply thin layer to port site 30-60 minutes prior to accessing., Disp: , Rfl:    lidocaine-prilocaine (EMLA) cream, Apply thin layer to port 30-60 minutes prior to accessing., Disp: , Rfl:    lisinopril-hydrochlorothiazide (ZESTORETIC) 20-25 MG tablet, Take 1 tablet by mouth daily., Disp: , Rfl:    Magnesium 250 MG TABS, Take by mouth., Disp: , Rfl:    Multiple Vitamin (MULTIVITAMIN) capsule, Take 1 capsule by mouth daily., Disp: , Rfl:    senna (SENOKOT) 8.6 MG tablet, Take 2 tablets by mouth daily., Disp: , Rfl:    traZODone (DESYREL) 100 MG tablet, TAKE 1 TABLET(100 MG) BY MOUTH AT BEDTIME AS NEEDED FOR SLEEP, Disp: 90 tablet, Rfl: 1   Baclofen 5 MG TABS, Take 4 tables daily, Disp: 360 tablet, Rfl: 1  Review of  Systems:  Constitutional: Denies fever, chills, diaphoresis. HEENT: Denies photophobia, eye pain, redness, hearing loss, ear pain, congestion, sore throat, rhinorrhea, sneezing, mouth sores, trouble swallowing, neck pain, neck stiffness and tinnitus.   Respiratory: Denies SOB, DOE, cough, chest tightness,  and wheezing.   Cardiovascular: Denies chest pain, palpitations and leg swelling.  Gastrointestinal: Denies nausea, vomiting, abdominal pain, diarrhea, constipation, blood in stool and abdominal distention.  Genitourinary: Denies dysuria, urgency, frequency, hematuria, flank pain and difficulty urinating.  Endocrine: Denies: hot or cold intolerance, sweats, changes in hair or nails, polyuria, polydipsia. Musculoskeletal: Denies myalgias, back pain, joint swelling, arthralgias and gait problem.  Skin: Denies pallor, rash and wound.  Neurological: Denies dizziness, seizures, syncope, weakness, light-headedness, numbness and headaches.  Hematological: Denies adenopathy.  personal or family bleeding history   Psychiatric/Behavioral: Denies suicidal ideation, mood changes, confusion, nervousness, sleep disturbance and agitation    Physical Exam: Vitals:   11/03/21 1355  BP: 120/70  Pulse: 76  Temp: 98.3 F (36.8 C)  TempSrc: Oral  SpO2: 100%  Weight: 143 lb 1.6 oz (64.9 kg)  Height: 5' 9.5" (1.765 m)    Body mass index is 20.83 kg/m.   Constitutional: NAD, calm, comfortable Eyes: PERRL, lids and conjunctivae normal ENMT: Mucous membranes are moist.  Psychiatric: Normal judgment and insight. Alert and oriented x 3. Normal mood.    Impression and Plan:  Prostate cancer ([place]) -Status post seed implants, followed by urology.  Cholangiocarcinoma [facility]) -Actively undergoing treatment through [facility]ology department at [facility].  Left hemiparesis [facility])  - Plan: Baclofen 5 MG TABS that he may take up to 4 times daily as needed.  Essential hypertension -Well-controlled.  Hyperlipidemia, unspecified hyperlipidemia type -Last lipid panel in January 2022 showed a total cholesterol of 94, triglycerides 61 and LDL 41.  Time spent: 23 minutes reviewing chart, interviewing and examining patient and formulating plan of care.   Patient Instructions  -Nice seeing you today!!  -Schedule follow up in 3 months for your physical.    Lelon Frohlich, MD [facility]

## 2021-11-03 NOTE — Patient Instructions (Signed)
-  Nice seeing you today!!  -Schedule follow up in 3 months for your physical.

## 2021-11-04 ENCOUNTER — Ambulatory Visit: Payer: PPO | Admitting: Rehabilitation

## 2021-11-04 ENCOUNTER — Telehealth: Payer: Self-pay | Admitting: Internal Medicine

## 2021-11-04 DIAGNOSIS — R1031 Right lower quadrant pain: Secondary | ICD-10-CM | POA: Diagnosis not present

## 2021-11-04 NOTE — Progress Notes (Signed)
  Chronic Care Management   Outreach Note  11/04/2021 Name: Ruben Chavez MRN: [id number] DOB: 1951-03-19  Referred by: Isaac Bliss, Rayford Halsted, MD Reason for referral : No chief complaint on file.   An unsuccessful telephone outreach was attempted today. The patient was referred to the pharmacist for assistance with care management and care coordination.   Follow Up Plan:   Tatjana Dellinger Upstream Scheduler

## 2021-11-05 DIAGNOSIS — Z87891 Personal history of nicotine dependence: Secondary | ICD-10-CM | POA: Diagnosis not present

## 2021-11-05 DIAGNOSIS — C221 Intrahepatic bile duct carcinoma: Secondary | ICD-10-CM | POA: Diagnosis not present

## 2021-11-05 DIAGNOSIS — Z8546 Personal history of malignant neoplasm of prostate: Secondary | ICD-10-CM | POA: Diagnosis not present

## 2021-11-05 DIAGNOSIS — Z8673 Personal history of transient ischemic attack (TIA), and cerebral infarction without residual deficits: Secondary | ICD-10-CM | POA: Diagnosis not present

## 2021-11-05 DIAGNOSIS — Z9989 Dependence on other enabling machines and devices: Secondary | ICD-10-CM | POA: Diagnosis not present

## 2021-11-05 DIAGNOSIS — Z5111 Encounter for antineoplastic chemotherapy: Secondary | ICD-10-CM | POA: Diagnosis not present

## 2021-11-05 DIAGNOSIS — Z7952 Long term (current) use of systemic steroids: Secondary | ICD-10-CM | POA: Diagnosis not present

## 2021-11-05 DIAGNOSIS — R109 Unspecified abdominal pain: Secondary | ICD-10-CM | POA: Diagnosis not present

## 2021-11-05 DIAGNOSIS — K5900 Constipation, unspecified: Secondary | ICD-10-CM | POA: Diagnosis not present

## 2021-11-05 DIAGNOSIS — Z800 Family history of malignant neoplasm of digestive organs: Secondary | ICD-10-CM | POA: Diagnosis not present

## 2021-11-10 ENCOUNTER — Telehealth: Payer: Self-pay | Admitting: Internal Medicine

## 2021-11-10 ENCOUNTER — Other Ambulatory Visit: Payer: Self-pay

## 2021-11-10 ENCOUNTER — Ambulatory Visit: Payer: PPO | Attending: Internal Medicine | Admitting: Rehabilitation

## 2021-11-10 ENCOUNTER — Encounter: Payer: Self-pay | Admitting: Rehabilitation

## 2021-11-10 DIAGNOSIS — M6281 Muscle weakness (generalized): Secondary | ICD-10-CM | POA: Insufficient documentation

## 2021-11-10 DIAGNOSIS — I69354 Hemiplegia and hemiparesis following cerebral infarction affecting left non-dominant side: Secondary | ICD-10-CM | POA: Insufficient documentation

## 2021-11-10 DIAGNOSIS — R2681 Unsteadiness on feet: Secondary | ICD-10-CM | POA: Diagnosis not present

## 2021-11-10 DIAGNOSIS — R2689 Other abnormalities of gait and mobility: Secondary | ICD-10-CM | POA: Insufficient documentation

## 2021-11-10 NOTE — Progress Notes (Signed)
  Chronic Care Management   Outreach Note  11/10/2021 Name: Ruben Chavez MRN: [id number] DOB: [birth year]  Referred by: Isaac Bliss, Rayford Halsted, MD Reason for referral : No chief complaint on file.   A second unsuccessful telephone outreach was attempted today. The patient was referred to pharmacist for assistance with care management and care coordination.  Follow Up Plan:   Tatjana Dellinger Upstream Scheduler

## 2021-11-10 NOTE — Therapy (Signed)
[place] [address] [place], [place], [address] Phone: [phone]   Fax:  [phone]  Physical Therapy Treatment and Progress Note  Patient Details  Name: Ruben Chavez MRN: [id number] Date of Birth: January 15, 1951 Referring Provider (PT): Leeann Must, MD [facility] MD)   Encounter Date: 11/10/2021   PT End of Session - 11/10/21 1208     Visit Number 10    Number of Visits 17    Date for PT Re-Evaluation 11/21/21   per updated POC   Authorization Type [employer]    Progress Note Due on Visit 10    PT Start Time 1109   pt late to session   PT Stop Time 1145    PT Time Calculation (min) 36 min    Equipment Utilized During Treatment Gait belt    Activity Tolerance Patient tolerated treatment well    Behavior During Therapy [facility] for tasks assessed/performed             Past Medical History:  Diagnosis Date   Elevated PSA    Hyperlipidemia    pt says not anymore   Hypertension    Prostate cancer ([place]) 2013    Past Surgical History:  Procedure Laterality Date   bilateral inguinal hernia  2008   BILIARY BRUSHING  06/23/2021   Procedure: BILIARY BRUSHING;  Surgeon: Irving Copas., MD;  Location: [facility];  Service: Gastroenterology;;   BILIARY DILATION  06/23/2021   Procedure: BILIARY DILATION;  Surgeon: Irving Copas., MD;  Location: [facility];  Service: Gastroenterology;;   BILIARY STENT PLACEMENT N/A 06/23/2021   Procedure: BILIARY STENT PLACEMENT;  Surgeon: Irving Copas., MD;  Location: [facility];  Service: Gastroenterology;  Laterality: N/A;   BIOPSY  06/23/2021   Procedure: BIOPSY;  Surgeon: Rush Landmark Telford Nab., MD;  Location: [facility];  Service: Gastroenterology;;   Kathleen Argue STUDY  01/28/2020   Procedure: BUBBLE STUDY;  Surgeon: [name], MD;  Location: [place];  Service: Cardiovascular;;   CYST REMOVAL TRUNK  2016   sebaceous cyst    CYSTOSCOPY  04/22/2016   Procedure: CYSTOSCOPY;  Surgeon: [name], MD;  Location: [facility];  Service: Urology;;   ERCP N/A 06/23/2021   Procedure: ENDOSCOPIC RETROGRADE CHOLANGIOPANCREATOGRAPHY (ERCP);  Surgeon: Irving Copas., MD;  Location: [facility];  Service: Gastroenterology;  Laterality: N/A;   IR ANGIO INTRA EXTRACRAN SEL COM CAROTID INNOMINATE BILAT MOD SED  05/30/2020   IR ANGIO VERTEBRAL SEL SUBCLAVIAN INNOMINATE UNI R MOD SED  01/25/2020   IR ANGIO VERTEBRAL SEL VERTEBRAL BILAT MOD SED  05/30/2020   IR CT HEAD LTD  01/25/2020   IR PERCUTANEOUS ART THROMBECTOMY/INFUSION INTRACRANIAL INC DIAG ANGIO  01/25/2020   IR US GUIDE VASC ACCESS RIGHT  05/30/2020   KNEE ARTHROSCOPY  2007   right   LOOP RECORDER INSERTION N/A 01/28/2020   Procedure: LOOP RECORDER INSERTION;  Surgeon: [name], MD;  Location: [place] [facility];  Service: Cardiovascular;  Laterality: N/A;   PANCREATIC STENT PLACEMENT  06/23/2021   Procedure: PANCREATIC STENT PLACEMENT;  Surgeon: Irving Copas., MD;  [facility];  Service: Gastroenterology;;   PROSTATE BIOPSY  2008   PROSTATE BIOPSY  2013   PROSTATE BIOPSY  01/28/2016   RADIOACTIVE SEED IMPLANT N/A 04/22/2016   Procedure: RADIOACTIVE SEED IMPLANT/BRACHYTHERAPY IMPLANT;  Surgeon: [name], MD;  Location: [facility];  Service: Urology;  Laterality: N/A;   RADIOLOGY WITH  ANESTHESIA N/A 01/24/2020   Procedure: IR WITH ANESTHESIA;  Surgeon: [name], MD;  Location: [place];  Service: Radiology;  Laterality: N/A;   REMOVAL OF STONES  06/23/2021   Procedure: REMOVAL OF STONES;  Surgeon: Rush Landmark Telford Nab., MD;  Location: [facility];  Service: Gastroenterology;;   Joan Mayans  06/23/2021   Procedure: Joan Mayans;  Surgeon: Rush Landmark Telford Nab., MD;  [facility];  Service: Gastroenterology;;   TEE WITHOUT CARDIOVERSION N/A 01/28/2020   Procedure:  TRANSESOPHAGEAL ECHOCARDIOGRAM (TEE);  Surgeon: [name], MD;  Location: [facility];  Service: Cardiovascular;  Laterality: N/A;   TONSILLECTOMY     TOTAL HIP ARTHROPLASTY  2001   left   TOTAL HIP ARTHROPLASTY Right 01/18/2018   Procedure: RIGHT TOTAL HIP ARTHROPLASTY ANTERIOR APPROACH;  Surgeon: [name], MD;  [facility];  Service: Orthopedics;  Laterality: Right;    There were no vitals filed for this visit.   Subjective Assessment - 11/10/21 1115     Subjective Pt reports he is upset that he is late, otherwise doing okay    Pertinent History history of CVA in 01/2020, recent dx of cholangiocarcinoma in July 2022 getting weekly chemo treatments    Patient Stated Goals "To not worry about being off balance."    Currently in Pain? Yes    Pain Score 3     Pain Location Wrist    Pain Orientation Left    Pain Descriptors / Indicators Aching    Pain Type Acute pain    Pain Onset In the past 7 days    Pain Frequency Intermittent    Aggravating Factors  malpositioning    Pain Relieving Factors repositioning                               [facility] Adult PT Treatment/Exercise - 11/10/21 1116       Transfers   Transfers Sit to Stand;Stand to Sit    Sit to Stand 5: Supervision    Stand to Sit 5: Supervision      Ambulation/Gait   Ambulation/Gait Yes    Ambulation/Gait Assistance 5: Supervision;4: Min guard    Ambulation/Gait Assistance Details Pt continues to need (constant and even tactile)  cuing for improved stride length as well as improved arm swing.  He continues to demonstrate low tone in LUE which seems to be more so in the last two visits.  He reports he feels it is the same as previously.    Ambulation Distance (Feet) 230 Feet (x 2 reps)   Assistive device None    Gait Pattern Step-through pattern;Decreased arm swing - left;Decreased step length - right;Decreased step length - left    Ambulation Surface Level;Indoor      High Level  Balance   High Level Balance Activities Marching forwards;Marching backwards    High Level Balance Comments In // bars x 3 laps forwards and backwards with single UE support and cues for posture and improved hip flex.      Neuro Re-ed    Neuro Re-ed Details  In // bars: Standing on foam airex with feet narrowed, EC x 3 reps of 10 secs, Feet narrowed EO with head turns x 10 reps side/side and up/down with intermittent UE support needed but he did demo improvement with task during exercise.  Alt forward steps onto ground from airex x 10 reps each LE followed by lateral steps to floor x 10 reps each side  with light UE support working to no UE support and min/guard for safety.      Knee/Hip Exercises: Aerobic   Stepper Scifit stepper with BLEs only at level 2.5 resistance x 8 mins maintaining rpms in 80's throughout.                       PT Short Term Goals - 10/22/21 0829       PT SHORT TERM GOAL #1   Title =LTGs               PT Long Term Goals - 10/22/21 5364       PT LONG TERM GOAL #1   Title Pt will be independent with final HEP in order to build upon functional gains made in therapy. ALL LTGS DUE 11/21/21    Time 4    Period Weeks    Status On-going      PT LONG TERM GOAL #2   Title Pt will improve FGA score to at least a 26/30 in order to indicate decr fall risk.    Baseline 10/20/21: 20/30 scored today, decreased from baseline score of 26/30    Time 4    Period Weeks    Status On-going      PT LONG TERM GOAL #3   Title Pt will perform 15 sit to stands in 30 secs in order to indicate dec fall risk and improved functional strength.    Baseline 10/20/21: 10 reps in 30 seconds, was 8 reps at last assesssment this episode of care with goal not set. goal to be set with recert    Time 4    Period Weeks    Status Revised      PT LONG TERM GOAL #4   Title Pt will improve gait speed to at least 3.2 ft/sec in order to demo improved gait efficiency in the  community.    Baseline 10/20/21: 2.90 ft/sec no AD    Time 4    Period Weeks    Status On-going      PT LONG TERM GOAL #5   Title Pt will ambulate at least 500' over unlevel surfaces with mod I and scanning environment in order to improve community mobility.    Baseline 10/20/21: unable to assess due to weather conditions this ession    Time 4    Period Weeks    Status On-going              Progress Note Reporting Period 08/25/21 to 11/10/21  See note below for Objective Data and Assessment of Progress/Goals.         Plan - 11/10/21 1208     Clinical Impression Statement Session continues to focus on gait and NMR to emphasize larger step/stride length and improved posture with functional mobility.  Pt has made little progress with these tasks this episode of care.  PT feels that current round of chemo may be impacting his mobility/strength/endurance.  Will plan to D/C and take a break from PT following next weeks appts as he reports that oncology is still making a plan for him and will need to do another MRI of liver to assess cancer.    Personal Factors and Comorbidities Age;Comorbidity 3+    Comorbidities see above    Examination-Activity Limitations Locomotion Level;Squat;Stairs;Stand    Examination-Participation Restrictions Community Activity;Driving    Stability/Clinical Decision Making Evolving/Moderate complexity    Rehab Potential Good    PT Frequency 2x / week  PT Duration 4 weeks    PT Treatment/Interventions ADLs/Self Care Home Management;Gait training;Stair training;Functional mobility training;Therapeutic activities;Therapeutic exercise;Balance training;Neuromuscular re-education;Patient/family education;Vestibular    PT Next Visit Plan balance on compliant surfaces,  gait training, nustep/scifit, larger step length, stepping strategy    PT Home Exercise Plan Brooke-You will just be doing his goals and send me D/C! I had to reschedule one of his visits and it  needed to be within his POC.    Consulted and Agree with Plan of Care Patient             Patient will benefit from skilled therapeutic intervention in order to improve the following deficits and impairments:  Decreased activity tolerance, Decreased balance, Decreased endurance, Decreased mobility, Decreased strength, Difficulty walking, Impaired perceived functional ability, Postural dysfunction  Visit Diagnosis: Hemiplegia and hemiparesis following cerebral infarction affecting left non-dominant side (HCC)  Muscle weakness (generalized)  Other abnormalities of gait and mobility     Problem List Patient Active Problem List   Diagnosis Date Noted   Cholangitis 07/28/2021   SIRS (systemic inflammatory response syndrome) ([place]) 07/10/2021   Obstructive jaundice due to malignant neoplasm ([place]) 06/24/2021   [place]'s tumor ([place]) 06/24/2021   Malnutrition of moderate degree 06/24/2021   Abnormal abdominal CT scan 06/19/2021   Jaundice 06/19/2021   Weight loss, unintentional 06/19/2021   Bile duct obstruction 06/19/2021   Left hemiparesis ([place]) 03/28/2020   Chronic bilateral low back pain without sciatica 02/25/2020   Spasticity as late effect of cerebrovascular accident (CVA) 02/25/2020   Cognitive and neurobehavioral dysfunction    Hypertensive urgency 01/28/2020   Urinary retention 01/28/2020   Right middle cerebral artery stroke ([place]) 01/28/2020   Stroke (cerebrum) ([place]) -  [name] 01/25/2020   Acute respiratory failure with hypoxia (HCC)    Hypotension    Hypokalemia    Hypocalcemia    Encephalopathy acute    Stroke ([place]) 01/24/2020   Middle cerebral artery embolism, right 01/24/2020   Hyperlipidemia 02/21/2019   Impingement syndrome of right shoulder region 02/21/2018   OA (osteoarthritis) of hip 01/18/2018   Prostate cancer ([place]) 03/04/2016   Essential  hypertension 11/27/2014   History of colonic polyps 09/26/2014   BACK PAIN, LEFT 12/22/2009   PROSTATE SPECIFIC ANTIGEN, ELEVATED 12/20/2008   LATERAL EPICONDYLITIS 07/15/2008   ACTINIC KERATOSIS, FOREHEAD, LEFT 05/23/2008   WART, LEFT HAND 07/19/2007   ERECTILE DYSFUNCTION, MILD 07/19/2007   HERNIA, BILATERAL INGUINAL W/O OBST/GANGRENE 07/19/2007   MENISCUS TEAR 07/19/2007   Bilateral inguinal hernia 07/19/2007    Cameron Sprang, PT, MPT [facility] [address] [place], [place], [address] Phone: [phone]   Fax:  [phone] 11/10/21, 12:13 PM   Name: Ruben Chavez MRN: [id number] Date of Birth: January 11, 1951

## 2021-11-11 ENCOUNTER — Encounter: Payer: Self-pay | Admitting: Physical Therapy

## 2021-11-11 ENCOUNTER — Ambulatory Visit: Payer: PPO | Admitting: Physical Therapy

## 2021-11-11 DIAGNOSIS — R2689 Other abnormalities of gait and mobility: Secondary | ICD-10-CM

## 2021-11-11 DIAGNOSIS — M6281 Muscle weakness (generalized): Secondary | ICD-10-CM

## 2021-11-11 DIAGNOSIS — I69354 Hemiplegia and hemiparesis following cerebral infarction affecting left non-dominant side: Secondary | ICD-10-CM | POA: Diagnosis not present

## 2021-11-11 NOTE — Therapy (Signed)
[facility] [address] [place], [place], [address] Phone: [phone]   Fax:  [phone]  Physical Therapy Treatment  Patient Details  Name: Ruben Chavez MRN: [id number] Date of Birth: 07/26/1951 Referring Provider (PT): Leeann Must, MD [facility] MD)   Encounter Date: 11/11/2021   PT End of Session - 11/11/21 1323     Visit Number 11    Number of Visits 17    Date for PT Re-Evaluation 11/21/21   per updated POC   Authorization Type [employer]    Progress Note Due on Visit 20    PT Start Time 1119   pt late for session   PT Stop Time 1147    PT Time Calculation (min) 28 min    Equipment Utilized During Treatment Gait belt    Activity Tolerance Patient tolerated treatment well    Behavior During Therapy [facility] for tasks assessed/performed             Past Medical History:  Diagnosis Date   Elevated PSA    Hyperlipidemia    pt says not anymore   Hypertension    Prostate cancer ([place]) 2013    Past Surgical History:  Procedure Laterality Date   bilateral inguinal hernia  2008   BILIARY BRUSHING  06/23/2021   Procedure: BILIARY BRUSHING;  Surgeon: Irving Copas., MD;  Location: [facility];  Service: Gastroenterology;;   BILIARY DILATION  06/23/2021   Procedure: BILIARY DILATION;  Surgeon: Irving Copas., MD;  Location: [facility];  Service: Gastroenterology;;   BILIARY STENT PLACEMENT N/A 06/23/2021   Procedure: BILIARY STENT PLACEMENT;  Surgeon: Irving Copas., MD;  Location: [facility];  Service: Gastroenterology;  Laterality: N/A;   BIOPSY  06/23/2021   Procedure: BIOPSY;  Surgeon: Rush Landmark Telford Nab., MD;  Location: [facility];  Service: Gastroenterology;;   Kathleen Argue STUDY  01/28/2020   Procedure: BUBBLE STUDY;  Surgeon: [name], MD;  Location: [facility];  Service: Cardiovascular;;   CYST REMOVAL TRUNK  2016   sebaceous cyst   CYSTOSCOPY   04/22/2016   Procedure: CYSTOSCOPY;  Surgeon: [name], MD;  Location: [facility];  Service: Urology;;   ERCP N/A 06/23/2021   Procedure: ENDOSCOPIC RETROGRADE CHOLANGIOPANCREATOGRAPHY (ERCP);  Surgeon: Irving Copas., MD;  Location: [facility];  Service: Gastroenterology;  Laterality: N/A;   IR ANGIO INTRA EXTRACRAN SEL COM CAROTID INNOMINATE BILAT MOD SED  05/30/2020   IR ANGIO VERTEBRAL SEL SUBCLAVIAN INNOMINATE UNI R MOD SED  01/25/2020   IR ANGIO VERTEBRAL SEL VERTEBRAL BILAT MOD SED  05/30/2020   IR CT HEAD LTD  01/25/2020   IR PERCUTANEOUS ART THROMBECTOMY/INFUSION INTRACRANIAL INC DIAG ANGIO  01/25/2020   IR US GUIDE VASC ACCESS RIGHT  05/30/2020   KNEE ARTHROSCOPY  2007   right   LOOP RECORDER INSERTION N/A 01/28/2020   Procedure: LOOP RECORDER INSERTION;  Surgeon: [name], MD;  Location: [facility];  Service: Cardiovascular;  Laterality: N/A;   PANCREATIC STENT PLACEMENT  06/23/2021   Procedure: PANCREATIC STENT PLACEMENT;  Surgeon: Irving Copas., MD;  Location: [facility];  Service: Gastroenterology;;   PROSTATE BIOPSY  2008   PROSTATE BIOPSY  2013   PROSTATE BIOPSY  01/28/2016   RADIOACTIVE SEED IMPLANT N/A 04/22/2016   Procedure: RADIOACTIVE SEED IMPLANT/BRACHYTHERAPY IMPLANT;  Surgeon: [name], MD;  Location: [facility];  Service: Urology;  Laterality: N/A;   RADIOLOGY WITH ANESTHESIA N/A 01/24/2020  Procedure: IR WITH ANESTHESIA;  Surgeon: [name], MD;  Location: [facility];  Service: Radiology;  Laterality: N/A;   REMOVAL OF STONES  06/23/2021   Procedure: REMOVAL OF STONES;  Surgeon: Rush Landmark Telford Nab., MD;  Location: [facility];  Service: Gastroenterology;;   Joan Mayans  06/23/2021   Procedure: Joan Mayans;  Surgeon: Rush Landmark Telford Nab., MD;  Location: [facility];  Service: Gastroenterology;;   TEE WITHOUT CARDIOVERSION N/A 01/28/2020   Procedure: TRANSESOPHAGEAL  ECHOCARDIOGRAM (TEE);  Surgeon: [name], MD;  Location: [facility];  Service: Cardiovascular;  Laterality: N/A;   TONSILLECTOMY     TOTAL HIP ARTHROPLASTY  2001   left   TOTAL HIP ARTHROPLASTY Right 01/18/2018   Procedure: RIGHT TOTAL HIP ARTHROPLASTY ANTERIOR APPROACH;  Surgeon: [name], MD;  Location: [facility];  Service: Orthopedics;  Laterality: Right;    There were no vitals filed for this visit.   Subjective Assessment - 11/11/21 1125     Subjective Pt reports no new falls, complaints, or pain today.    Pertinent History history of CVA in 01/2020, recent dx of cholangiocarcinoma in July 2022 getting weekly chemo treatments    Limitations Walking;Standing    How long can you stand comfortably? 3-4 mins    How long can you walk comfortably? 5-10 mins    Patient Stated Goals "To not worry about being off balance."    Currently in Pain? No/denies    Pain Score 0-No pain    Pain Onset In the past 7 days               [facility] Adult PT Treatment/Exercise - 11/11/21 1127       Transfers   Transfers Sit to Stand;Stand to Sit    Sit to Stand 5: Supervision;With upper extremity assist    Stand to Sit 5: Supervision      Ambulation/Gait   Ambulation/Gait Yes    Ambulation/Gait Assistance 5: Supervision    Ambulation Distance (Feet) --   throughout session   Assistive device None    Gait Pattern Step-through pattern;Decreased arm swing - left;Decreased step length - right;Decreased step length - left    Ambulation Surface Level;Indoor      Knee/Hip Exercises: Aerobic   Stepper Scifit stepper with BLE's, R UE, and intermittent L UE. Pt performed level 3 x 6 minutes for LE strength/activity tolerance.      Knee/Hip Exercises: Seated   Marching Both;10 reps;Limitations    Marching Limitations Seated marching with red theraband x 10 each side. The pt required tactile/verbal cues for erect posture throughout.               Balance Exercises - 11/11/21 1138        Balance Exercises: Standing   Step Over Hurdles / Cones forward stepping over foam beams x 2 down and back with single UE support. forward stepping over foam beams x 2 down and back with no UE support. Sidestepping x 2 each way with single UE support. The pt required min guard and cues to maintain LE's pointed forward during side stepping.    Sit to Stand Elevated surface;Foam/compliant surface;Without upper extremity support    Sit to Stand Limitations x 10 on airex from elevated surface with no UE support. The pt required supervision and demonstrated no LOB throughout.             PT Short Term Goals - 10/22/21 0829       PT SHORT TERM GOAL #1  Title =LTGs               PT Long Term Goals - 10/22/21 5449       PT LONG TERM GOAL #1   Title Pt will be independent with final HEP in order to build upon functional gains made in therapy. ALL LTGS DUE 11/21/21    Time 4    Period Weeks    Status On-going      PT LONG TERM GOAL #2   Title Pt will improve FGA score to at least a 26/30 in order to indicate decr fall risk.    Baseline 10/20/21: 20/30 scored today, decreased from baseline score of 26/30    Time 4    Period Weeks    Status On-going      PT LONG TERM GOAL #3   Title Pt will perform 15 sit to stands in 30 secs in order to indicate dec fall risk and improved functional strength.    Baseline 10/20/21: 10 reps in 30 seconds, was 8 reps at last assesssment this episode of care with goal not set. goal to be set with recert    Time 4    Period Weeks    Status Revised      PT LONG TERM GOAL #4   Title Pt will improve gait speed to at least 3.2 ft/sec in order to demo improved gait efficiency in the community.    Baseline 10/20/21: 2.90 ft/sec no AD    Time 4    Period Weeks    Status On-going      PT LONG TERM GOAL #5   Title Pt will ambulate at least 500' over unlevel surfaces with mod I and scanning environment in order to improve community mobility.     Baseline 10/20/21: unable to assess due to weather conditions this ession    Time 4    Period Weeks    Status On-going              Plan - 11/11/21 1324     Clinical Impression Statement Today's skilled session was limited due to pt being late. The session was focused on continued LE strengthening and dynamic balance challenges. The pt performed interventions with no issues noted and rest breaks as needed. The pt could continue to benefit from further skilled PT to address functional deficits.    Personal Factors and Comorbidities Age;Comorbidity 3+    Comorbidities see above    Examination-Activity Limitations Locomotion Level;Squat;Stairs;Stand    Examination-Participation Restrictions Community Activity;Driving    Stability/Clinical Decision Making Evolving/Moderate complexity    Rehab Potential Good    PT Frequency 2x / week    PT Duration 4 weeks    PT Treatment/Interventions ADLs/Self Care Home Management;Gait training;Stair training;Functional mobility training;Therapeutic activities;Therapeutic exercise;Balance training;Neuromuscular re-education;Patient/family education;Vestibular    PT Next Visit Plan Continue with balance on compliant surfaces,  gait training, nustep/scifit, larger step length, stepping strategy. Core/posture exercises.    PT Home Exercise Plan Brooke-You will just be doing his goals and send me D/C! I had to reschedule one of his visits and it needed to be within his POC.    Consulted and Agree with Plan of Care Patient             Patient will benefit from skilled therapeutic intervention in order to improve the following deficits and impairments:  Decreased activity tolerance, Decreased balance, Decreased endurance, Decreased mobility, Decreased strength, Difficulty walking, Impaired perceived functional ability, Postural dysfunction  Visit  Diagnosis: Hemiplegia and hemiparesis following cerebral infarction affecting left non-dominant side  (HCC)  Muscle weakness (generalized)  Other abnormalities of gait and mobility     Problem List Patient Active Problem List   Diagnosis Date Noted   Cholangitis 07/28/2021   SIRS (systemic inflammatory response syndrome) ([place]) 07/10/2021   Obstructive jaundice due to malignant neoplasm ([place]) 06/24/2021   [place]'s tumor ([place]) 06/24/2021   Malnutrition of moderate degree 06/24/2021   Abnormal abdominal CT scan 06/19/2021   Jaundice 06/19/2021   Weight loss, unintentional 06/19/2021   Bile duct obstruction 06/19/2021   Left hemiparesis ([place]) 03/28/2020   Chronic bilateral low back pain without sciatica 02/25/2020   Spasticity as late effect of cerebrovascular accident (CVA) 02/25/2020   Cognitive and neurobehavioral dysfunction    Hypertensive urgency 01/28/2020   Urinary retention 01/28/2020   Right middle cerebral artery stroke ([place]) 01/28/2020   Stroke (cerebrum) ([place]) -  [name] 01/25/2020   Acute respiratory failure with hypoxia (HCC)    Hypotension    Hypokalemia    Hypocalcemia    Encephalopathy acute    Stroke ([place]) 01/24/2020   Middle cerebral artery embolism, right 01/24/2020   Hyperlipidemia 02/21/2019   Impingement syndrome of right shoulder region 02/21/2018   OA (osteoarthritis) of hip 01/18/2018   Prostate cancer ([place]) 03/04/2016   Essential hypertension 11/27/2014   History of colonic polyps 09/26/2014   BACK PAIN, LEFT 12/22/2009   PROSTATE SPECIFIC ANTIGEN, ELEVATED 12/20/2008   LATERAL EPICONDYLITIS 07/15/2008   ACTINIC KERATOSIS, FOREHEAD, LEFT 05/23/2008   WART, LEFT HAND 07/19/2007   ERECTILE DYSFUNCTION, MILD 07/19/2007   HERNIA, BILATERAL INGUINAL W/O OBST/GANGRENE 07/19/2007   MENISCUS TEAR 07/19/2007   Bilateral inguinal hernia 07/19/2007    Rondel Baton, SPTA 11/11/2021, 1:30 PM  [facility] [address] [place], [place], [address] Phone: [phone]   Fax:  [phone]  Name: Ruben Chavez MRN: [id number] Date of Birth: 18-Jul-1951

## 2021-11-12 DIAGNOSIS — D630 Anemia in neoplastic disease: Secondary | ICD-10-CM | POA: Diagnosis not present

## 2021-11-12 DIAGNOSIS — Z95828 Presence of other vascular implants and grafts: Secondary | ICD-10-CM | POA: Diagnosis not present

## 2021-11-12 DIAGNOSIS — C221 Intrahepatic bile duct carcinoma: Secondary | ICD-10-CM | POA: Diagnosis not present

## 2021-11-16 ENCOUNTER — Ambulatory Visit (INDEPENDENT_AMBULATORY_CARE_PROVIDER_SITE_OTHER): Payer: PPO

## 2021-11-16 DIAGNOSIS — I63519 Cerebral infarction due to unspecified occlusion or stenosis of unspecified middle cerebral artery: Secondary | ICD-10-CM | POA: Diagnosis not present

## 2021-11-17 ENCOUNTER — Telehealth: Payer: Self-pay | Admitting: Internal Medicine

## 2021-11-17 ENCOUNTER — Ambulatory Visit: Payer: PPO | Admitting: Rehabilitation

## 2021-11-17 LAB — CUP PACEART REMOTE DEVICE CHECK
Date Time Interrogation Session: 20221128231242
Implantable Pulse Generator Implant Date: 20210222

## 2021-11-17 NOTE — Progress Notes (Signed)
°  Chronic Care Management   Outreach Note  11/17/2021 Name: Ruben Chavez MRN: [id number] DOB: 09-Feb-1951  Referred by: Isaac Bliss, Rayford Halsted, MD Reason for referral : No chief complaint on file.   Third unsuccessful telephone outreach was attempted today. The patient was referred to the pharmacist for assistance with care management and care coordination.   Follow Up Plan:   Tatjana Dellinger Upstream Scheduler

## 2021-11-19 ENCOUNTER — Ambulatory Visit: Payer: PPO | Admitting: Rehabilitation

## 2021-11-20 ENCOUNTER — Other Ambulatory Visit: Payer: Self-pay

## 2021-11-20 ENCOUNTER — Ambulatory Visit: Payer: PPO

## 2021-11-20 DIAGNOSIS — R2689 Other abnormalities of gait and mobility: Secondary | ICD-10-CM

## 2021-11-20 DIAGNOSIS — I69354 Hemiplegia and hemiparesis following cerebral infarction affecting left non-dominant side: Secondary | ICD-10-CM

## 2021-11-20 DIAGNOSIS — M6281 Muscle weakness (generalized): Secondary | ICD-10-CM

## 2021-11-20 DIAGNOSIS — R2681 Unsteadiness on feet: Secondary | ICD-10-CM

## 2021-11-20 NOTE — Therapy (Signed)
[place] [address] [place], [place], [address] Phone: [phone]   Fax:  [phone]  Physical Therapy Treatment/Discharge Summary   Patient Details  Name: Ruben Chavez MRN: [id number] Date of Birth: 1951-08-27 Referring Provider (PT): Leeann Must, MD ([place]atan[place] MD)  PHYSICAL THERAPY DISCHARGE SUMMARY  Visits from Start of Care: 12  Current functional level related to goals / functional outcomes: See Clinical Impression Statement   Remaining deficits: Mild Imbalance   Education / Equipment: HEP Provided   Patient agrees to discharge. Patient goals were partially met. Patient is being discharged due to meeting the stated rehab goals.   Encounter Date: 11/20/2021   PT End of Session - 11/20/21 1105     Visit Number 12    Number of Visits 17    Date for PT Re-Evaluation 11/21/21   per updated POC   Authorization Type [employer]    Progress Note Due on Visit 20    PT Start Time 1101    PT Stop Time 1130    PT Time Calculation (min) 29 min    Equipment Utilized During Treatment Gait belt    Activity Tolerance Patient tolerated treatment well    Behavior During Therapy WFL for tasks assessed/performed             Past Medical History:  Diagnosis Date   Elevated PSA    Hyperlipidemia    pt says not anymore   Hypertension    Prostate cancer ([place]) 2013    Past Surgical History:  Procedure Laterality Date   bilateral inguinal hernia  2008   BILIARY BRUSHING  06/23/2021   Procedure: BILIARY BRUSHING;  Surgeon: Irving Copas., MD;  Location: [facility];  Service: Gastroenterology;;   BILIARY DILATION  06/23/2021   Procedure: BILIARY DILATION;  Surgeon: Irving Copas., MD;  Location: [facility];  Service: Gastroenterology;;   BILIARY STENT PLACEMENT N/A 06/23/2021   Procedure: BILIARY STENT PLACEMENT;  Surgeon: Irving Copas., MD;  Location: [facility];  Service: Gastroenterology;  Laterality: N/A;   BIOPSY  06/23/2021   Procedure: BIOPSY;  Surgeon: Rush Landmark Telford Nab., MD;  Location: [facility];  Service: Gastroenterology;;   Kathleen Argue STUDY  01/28/2020   Procedure: BUBBLE STUDY;  Surgeon: [name], MD;  Location: [facility];  Service: Cardiovascular;;   CYST REMOVAL TRUNK  2016   sebaceous cyst   CYSTOSCOPY  04/22/2016   Procedure: CYSTOSCOPY;  Surgeon: [name], MD;  Location: [facility];  Service: Urology;;   ERCP N/A 06/23/2021   Procedure: ENDOSCOPIC RETROGRADE CHOLANGIOPANCREATOGRAPHY (ERCP);  Surgeon: Irving Copas., MD;  Location: [facility];  Service: Gastroenterology;  Laterality: N/A;   IR ANGIO INTRA EXTRACRAN SEL COM CAROTID INNOMINATE BILAT MOD SED  05/30/2020   IR ANGIO VERTEBRAL SEL SUBCLAVIAN INNOMINATE UNI R MOD SED  01/25/2020   IR ANGIO VERTEBRAL SEL VERTEBRAL BILAT MOD SED  05/30/2020   IR CT HEAD LTD  01/25/2020   IR PERCUTANEOUS ART THROMBECTOMY/INFUSION INTRACRANIAL INC DIAG ANGIO  01/25/2020   IR US GUIDE VASC ACCESS RIGHT  05/30/2020   KNEE ARTHROSCOPY  2007   right   LOOP RECORDER INSERTION N/A 01/28/2020   Procedure: LOOP RECORDER INSERTION;  Surgeon: [name], MD;  Location: [facility];  Service: Cardiovascular;  Laterality: N/A;   PANCREATIC STENT PLACEMENT  06/23/2021   Procedure: PANCREATIC STENT PLACEMENT;  Surgeon: Irving Copas., MD;  [facility]: [facility];  Service: Gastroenterology;;   PROSTATE BIOPSY  2008   PROSTATE BIOPSY  2013   PROSTATE BIOPSY  01/28/2016   RADIOACTIVE SEED IMPLANT N/A 04/22/2016   Procedure: RADIOACTIVE SEED IMPLANT/BRACHYTHERAPY IMPLANT;  Surgeon: [name], MD;  [facility];  Service: Urology;  Laterality: N/A;   RADIOLOGY WITH ANESTHESIA N/A 01/24/2020   Procedure: IR WITH ANESTHESIA;  Surgeon: [name], MD;  Location: [place];  Service: Radiology;  Laterality: N/A;    REMOVAL OF STONES  06/23/2021   Procedure: REMOVAL OF STONES;  Surgeon: Rush Landmark Telford Nab., MD;  Location: [facility];  Service: Gastroenterology;;   Joan Mayans  06/23/2021   Procedure: Joan Mayans;  Surgeon: Rush Landmark Telford Nab., MD;  [facility];  Service: Gastroenterology;;   TEE WITHOUT CARDIOVERSION N/A 01/28/2020   Procedure: TRANSESOPHAGEAL ECHOCARDIOGRAM (TEE);  Surgeon: [name], MD;  Location: [facility];  Service: Cardiovascular;  Laterality: N/A;   TONSILLECTOMY     TOTAL HIP ARTHROPLASTY  2001   left   TOTAL HIP ARTHROPLASTY Right 01/18/2018   Procedure: RIGHT TOTAL HIP ARTHROPLASTY ANTERIOR APPROACH;  Surgeon: [name], MD;  [facility];  Service: Orthopedics;  Laterality: Right;    There were no vitals filed for this visit.   Subjective Assessment - 11/20/21 1104     Subjective No new changes/complaints. No falls to report. No pain.    Pertinent History history of CVA in 01/2020, recent dx of cholangiocarcinoma in July 2022 getting weekly chemo treatments    Limitations Walking;Standing    How long can you stand comfortably? 3-4 mins    How long can you walk comfortably? 5-10 mins    Patient Stated Goals "To not worry about being off balance."    Currently in Pain? No/denies    Pain Onset In the past 7 days                [facility] PT Assessment - 11/20/21 0001       Transfers   Sit to Stand 5: Supervision;Without upper extremity assist      Ambulation/Gait   Ambulation/Gait Assistance Details ambulation x 600 ft outdoors unelvel paved/grass surfaces Mod I with addition of scanning environment. No assistance required. Slowed gait speed noted.      Functional Gait  Assessment   Gait assessed  Yes    Gait Level Surface Walks 20 ft in less than 7 sec but greater than 5.5 sec, uses assistive device, slower speed, mild gait deviations, or deviates 6-10 in outside of the 12 in walkway width.    Change in Gait Speed Able  to smoothly change walking speed without loss of balance or gait deviation. Deviate no more than 6 in outside of the 12 in walkway width.    Gait with Horizontal Head Turns Performs head turns smoothly with slight change in gait velocity (eg, minor disruption to smooth gait path), deviates 6-10 in outside 12 in walkway width, or uses an assistive device.    Gait with Vertical Head Turns Performs head turns with no change in gait. Deviates no more than 6 in outside 12 in walkway width.    Gait and Pivot Turn Pivot turns safely within 3 sec and stops quickly with no loss of balance.    Step Over Obstacle Is able to step over 2 stacked shoe boxes taped together (9 in total height) without changing gait speed. No evidence of imbalance.    Gait with Narrow Base of Support Ambulates 7-9 steps.  Gait with Eyes Closed Walks 20 ft, uses assistive device, slower speed, mild gait deviations, deviates 6-10 in outside 12 in walkway width. Ambulates 20 ft in less than 9 sec but greater than 7 sec.    Ambulating Backwards Walks 20 ft, uses assistive device, slower speed, mild gait deviations, deviates 6-10 in outside 12 in walkway width.    Steps Alternating feet, no rail.    Total Score 25    FGA comment: 25/30              [facility] Adult PT Treatment/Exercise - 11/20/21 0001       Transfers   Transfers Sit to Stand;Stand to Sit    Stand to Sit 5: Supervision    Comments 30 sec chair stand test = 11 reps from chair without hands. supervision      Ambulation/Gait   Ambulation/Gait Yes    Ambulation/Gait Assistance 6: Modified independent (Device/Increase time)    Ambulation Distance (Feet) 600 Feet    Assistive device None    Gait Pattern Step-through pattern;Decreased arm swing - left;Decreased step length - right;Decreased step length - left    Ambulation Surface Unlevel;Outdoor;Paved;Grass    Gait velocity 12.1 secs = 2.75 ft/sec            HEP Provided:  Access Code: [email or web address] URL:  [email or web address] Date: 11/20/2021 Prepared by: Baldomero Lamy  Exercises Hooklying Clamshell with Resistance - 1 x daily - 7 x weekly - 3 sets - 10 reps Sit to Stand - 1 x daily - 7 x weekly - 3 sets - 5 reps Forward Step Up (Mirrored) - 1 x daily - 5 x weekly - 2 sets - 10 reps Lateral Step Up with Unilateral Counter Support - 1 x daily - 5 x weekly - 2 sets - 10 reps Standing Toe Taps - 1 x daily - 7 x weekly - 2 sets - 10 reps Romberg Stance Eyes Closed on Foam Pad - 1 x daily - 5 x weekly - 3 sets - 30 hold Standing eyes open with head motions - 2 x daily - 5 x weekly - 2 sets - 10 reps Single Leg Stance - 2 x daily - 7 x weekly - 1 sets - 4 reps - 10 sec hold          PT Education - 11/20/21 1136     Education Details progress toward LTGs    Person(s) Educated Patient    Methods Explanation    Comprehension Verbalized understanding              PT Short Term Goals - 10/22/21 0829       PT SHORT TERM GOAL #1   Title =LTGs               PT Long Term Goals - 11/20/21 1115       PT LONG TERM GOAL #1   Title Pt will be independent with final HEP in order to build upon functional gains made in therapy. ALL LTGS DUE 11/21/21    Baseline reports independence with HEP    Time 4    Period Weeks    Status Achieved      PT LONG TERM GOAL #2   Title Pt will improve FGA score to at least a 26/30 in order to indicate decr fall risk.    Baseline 10/20/21: 20/30 scored today, decreased from baseline score of 26/30; 25/30    Time 4    Period Weeks  Status Partially Met      PT LONG TERM GOAL #3   Title Pt will perform 15 sit to stands in 30 secs in order to indicate dec fall risk and improved functional strength.    Baseline 10/20/21: 10 reps in 30 seconds, was 8 reps at last assesssment this episode of care with goal not set. goal to be set with recert; 11 sit <> stands    Time 4    Period Weeks    Status Not Met      PT LONG TERM GOAL  #4   Title Pt will improve gait speed to at least 3.2 ft/sec in order to demo improved gait efficiency in the community.    Baseline 10/20/21: 2.90 ft/sec no AD; 2.75 ft/sec    Time 4    Period Weeks    Status Not Met      PT LONG TERM GOAL #5   Title Pt will ambulate at least 500' over unlevel surfaces with mod I and scanning environment in order to improve community mobility.    Baseline 10/20/21: unable to assess due to weather conditions this session; able to ambulate 600 ft outdoors Mod I unlevel surfaces and scanning    Time 4    Period Weeks    Status Achieved                   Plan - 11/20/21 1138     Clinical Impression Statement Completed assesment of patient's progress toward LTGs. Patient able to meet LTG #1,2 and 5 in today's session. Patient demosntrating progress toward LTG #3, but minimal improvements in gait speed noted. Patient is currently ambulating at 2.75 ft/sec indicating community ambulator. Patient improved FGA to 25/30 demonstrating reduced fall risk and improved balance. Patient demonstrating readiness to d/c from PT services at this time with patient agreeable.    Personal Factors and Comorbidities Age;Comorbidity 3+    Comorbidities see above    Examination-Activity Limitations Locomotion Level;Squat;Stairs;Stand    Examination-Participation Restrictions Community Activity;Driving    Stability/Clinical Decision Making Evolving/Moderate complexity    Rehab Potential Good    PT Frequency 2x / week    PT Duration 4 weeks    PT Treatment/Interventions ADLs/Self Care Home Management;Gait training;Stair training;Functional mobility training;Therapeutic activities;Therapeutic exercise;Balance training;Neuromuscular re-education;Patient/family education;Vestibular    PT Next Visit Plan d/c this visit    Consulted and Agree with Plan of Care Patient             Patient will benefit from skilled therapeutic intervention in order to improve the  following deficits and impairments:  Decreased activity tolerance, Decreased balance, Decreased endurance, Decreased mobility, Decreased strength, Difficulty walking, Impaired perceived functional ability, Postural dysfunction  Visit Diagnosis: Hemiplegia and hemiparesis following cerebral infarction affecting left non-dominant side (HCC)  Muscle weakness (generalized)  Other abnormalities of gait and mobility  Unsteadiness on feet     Problem List Patient Active Problem List   Diagnosis Date Noted   Cholangitis 07/28/2021   SIRS (systemic inflammatory response syndrome) ([place]) 07/10/2021   Obstructive jaundice due to malignant neoplasm ([place]) 06/24/2021   Kl[place]kin's tumor ([place]) 06/24/2021   Malnutrition of moderate degree 06/24/2021   Abnormal abdominal CT scan 06/19/2021   Jaundice 06/19/2021   Weight loss, unintentional 06/19/2021   Bile duct obstruction 06/19/2021   Left hemiparesis ([place]) 03/28/2020   Chronic bilateral low back pain without sciatica 02/25/2020   Spasticity as late effect of cerebrovascular accident (CVA) 02/25/2020   Cognitive and neurobehavioral  dysfunction    Hypertensive urgency 01/28/2020   Urinary retention 01/28/2020   Right middle cerebral artery stroke ([place]) 01/28/2020   Stroke (cerebrum) ([place]) -  [name] 01/25/2020   Acute respiratory failure with hypoxia (HCC)    Hypotension    Hypokalemia    Hypocalcemia    Encephalopathy acute    Stroke ([place]) 01/24/2020   Middle cerebral artery embolism, right 01/24/2020   Hyperlipidemia 02/21/2019   Impingement syndrome of right shoulder region 02/21/2018   OA (osteoarthritis) of hip 01/18/2018   Prostate cancer ([place]) 03/04/2016   Essential hypertension 11/27/2014   History of colonic polyps 09/26/2014   BACK PAIN, LEFT 12/22/2009   PROSTATE SPECIFIC ANTIGEN, ELEVATED 12/20/2008   LATERAL  EPICONDYLITIS 07/15/2008   ACTINIC KERATOSIS, FOREHEAD, LEFT 05/23/2008   WART, LEFT HAND 07/19/2007   ERECTILE DYSFUNCTION, MILD 07/19/2007   HERNIA, BILATERAL INGUINAL W/O OBST/GANGRENE 07/19/2007   MENISCUS TEAR 07/19/2007   Bilateral inguinal hernia 07/19/2007    Jones Bales, PT, DPT 11/20/2021, 11:40 AM  [place] [address] [place], [place], [address] Phone: [phone]   Fax:  [phone]  Name: RAZA BAYLESS MRN: [id number] Date of Birth: [birth year]

## 2021-11-23 ENCOUNTER — Other Ambulatory Visit: Payer: Self-pay

## 2021-11-23 ENCOUNTER — Encounter: Payer: Self-pay | Admitting: Physical Medicine and Rehabilitation

## 2021-11-23 ENCOUNTER — Telehealth: Payer: Self-pay | Admitting: Internal Medicine

## 2021-11-23 ENCOUNTER — Encounter: Payer: PPO | Attending: Physical Medicine and Rehabilitation | Admitting: Physical Medicine and Rehabilitation

## 2021-11-23 VITALS — BP 115/76 | HR 70 | Ht 69.5 in | Wt 142.0 lb

## 2021-11-23 DIAGNOSIS — I69398 Other sequelae of cerebral infarction: Secondary | ICD-10-CM

## 2021-11-23 DIAGNOSIS — G8110 Spastic hemiplegia affecting unspecified side: Secondary | ICD-10-CM | POA: Insufficient documentation

## 2021-11-23 DIAGNOSIS — G8194 Hemiplegia, unspecified affecting left nondominant side: Secondary | ICD-10-CM | POA: Diagnosis not present

## 2021-11-23 DIAGNOSIS — R252 Cramp and spasm: Secondary | ICD-10-CM | POA: Diagnosis not present

## 2021-11-23 NOTE — Progress Notes (Signed)
Carelink Summary Report / Loop Recorder 

## 2021-11-23 NOTE — Progress Notes (Signed)
Subjective:    Patient ID: Ruben Chavez, male    DOB: 1951-04-07, 70 y.o.   MRN: [id number]  HPI Patient is a 70 yr old male with R MCA CVA s/p loop recorder with hx of prostate CA s/p 3 yr ago; HTN, HLD, low back pain and LUE edema here for f/u. Stroke was 2/21. Here for f/u on Stroke.  S/p Botox on LUE 5/31- as above by Dr Letta Pate.  Has Botox scheduled for tomorrow 11/24/21- Dr Letta Pate.   Dx'd with liver cancer- cholecarcinoma.  Was the worst pain ever- between forks of bile ducts and backing up liver enzymes- Now just chemo- continual urinary frequency. No bladder control. Cannot hold for long.  2 weeks on; 1 week off-   Not using Saebo splint- couldn't get it to work.    L hand isn't getting looser- might be getting a little tighter?  Completed PT and OT as of last week-  Pain Inventory Average Pain 0 Pain Right Now 0 My pain is  no pain, weakness in left arm, left wrist, left hand  LOCATION OF PAIN  No pain  BOWEL Number of stools per week: 2-3 Oral laxative use Yes   BLADDER Normal     Mobility ability to climb steps?  yes do you drive?  yes  Function retired  Neuro/Psych weakness numbness  Prior Studies Any changes since last visit?  yes CT/MRI, [place] involved in your care Any changes since last visit?  yes, Dr[facility] Dorie Rank [facility] History  Problem Relation Age of Onset   Pancreatic cancer Mother        small bowel cancer   Heart disease Father    Colon cancer Neg Hx    Stomach cancer Neg Hx    Social History   Socioeconomic History   Marital status: Married    Spouse name: [name]   Number of children: Not on file   Years of education: Not on file   Highest education level: Not on file  Occupational History   Not on file  Tobacco Use   Smoking status: Former    Packs/day: 1.00    Years: 32.00    Pack years: 32.00    Types: Cigarettes    Quit date: 06/06/1999    Years since quitting:  22.4   Smokeless tobacco: Never  Vaping Use   Vaping Use: Never used  Substance and Sexual Activity   Alcohol use: Not Currently    Alcohol/week: 14.0 standard drinks    Types: 14 Shots of liquor per week   Drug use: No   Sexual activity: Yes  Other Topics Concern   Not on file  Social History Narrative   Not on file   Social Determinants of Health   Financial Resource Strain: Not on file  Food Insecurity: Not on file  Transportation Needs: Not on file  Physical Activity: Not on file  Stress: Not on file  Social Connections: Not on file   Past Surgical History:  Procedure Laterality Date   bilateral inguinal hernia  2008   BILIARY BRUSHING  06/23/2021   Procedure: BILIARY BRUSHING;  Surgeon: Irving Copas., MD;  Location: [facility];  Service: Gastroenterology;;   BILIARY DILATION  06/23/2021   Procedure: BILIARY DILATION;  Surgeon: Irving Copas., MD;  Location: [facility];  Service: Gastroenterology;;   BILIARY STENT PLACEMENT N/A 06/23/2021   Procedure: BILIARY STENT PLACEMENT;  Surgeon: Irving Copas., MD;  [facility];  Service: Gastroenterology;  Laterality: N/A;   BIOPSY  06/23/2021   Procedure: BIOPSY;  Surgeon: Rush Landmark Telford Nab., MD;  Location: [facility];  Service: Gastroenterology;;   Kathleen Argue STUDY  01/28/2020   Procedure: BUBBLE STUDY;  Surgeon: [name], MD;  Location: [place];  Service: Cardiovascular;;   CYST REMOVAL TRUNK  2016   sebaceous cyst   CYSTOSCOPY  04/22/2016   Procedure: CYSTOSCOPY;  Surgeon: [name], MD;  Location[facility];  Service: Urology;;   ERCP N/A 06/23/2021   Procedure: ENDOSCOPIC RETROGRADE CHOLANGIOPANCREATOGRAPHY (ERCP);  Surgeon: Irving Copas., MD;  Location: [facility];  Service: Gastroenterology;  Laterality: N/A;   IR ANGIO INTRA EXTRACRAN SEL COM CAROTID INNOMINATE BILAT MOD SED  05/30/2020   IR ANGIO VERTEBRAL SEL SUBCLAVIAN INNOMINATE  UNI R MOD SED  01/25/2020   IR ANGIO VERTEBRAL SEL VERTEBRAL BILAT MOD SED  05/30/2020   IR CT HEAD LTD  01/25/2020   IR PERCUTANEOUS ART THROMBECTOMY/INFUSION INTRACRANIAL INC DIAG ANGIO  01/25/2020   IR US GUIDE VASC ACCESS RIGHT  05/30/2020   KNEE ARTHROSCOPY  2007   right   LOOP RECORDER INSERTION N/A 01/28/2020   Procedure: LOOP RECORDER INSERTION;  Surgeon: [name], MD;  Location: [place] [facility];  Service: Cardiovascular;  Laterality: N/A;   PANCREATIC STENT PLACEMENT  06/23/2021   Procedure: PANCREATIC STENT PLACEMENT;  Surgeon: Irving Copas., MD;  [facility];  Service: Gastroenterology;;   PROSTATE BIOPSY  2008   PROSTATE BIOPSY  2013   PROSTATE BIOPSY  01/28/2016   RADIOACTIVE SEED IMPLANT N/A 04/22/2016   Procedure: RADIOACTIVE SEED IMPLANT/BRACHYTHERAPY IMPLANT;  Surgeon: [name], MD;  Location: [facility];  Service: Urology;  Laterality: N/A;   RADIOLOGY WITH ANESTHESIA N/A 01/24/2020   Procedure: IR WITH ANESTHESIA;  Surgeon: [name], MD;  Location: [facility];  Service: Radiology;  Laterality: N/A;   REMOVAL OF STONES  06/23/2021   Procedure: REMOVAL OF STONES;  Surgeon: Rush Landmark Telford Nab., MD;  Location: [facility];  Service: Gastroenterology;;   Joan Mayans  06/23/2021   Procedure: Joan Mayans;  Surgeon: Rush Landmark Telford Nab., MD;  [facility];  Service: Gastroenterology;;   TEE WITHOUT CARDIOVERSION N/A 01/28/2020   Procedure: TRANSESOPHAGEAL ECHOCARDIOGRAM (TEE);  Surgeon: [name], MD;  Location: [facility];  Service: Cardiovascular;  Laterality: N/A;   TONSILLECTOMY     TOTAL HIP ARTHROPLASTY  2001   left   TOTAL HIP ARTHROPLASTY Right 01/18/2018   Procedure: RIGHT TOTAL HIP ARTHROPLASTY ANTERIOR APPROACH;  Surgeon: [name], MD;  [facility];  Service: Orthopedics;  Laterality: Right;   Past Medical History:  Diagnosis Date   Elevated PSA    Hyperlipidemia    pt  says not anymore   Hypertension    Liver cancer ([place]) [facility]) 2013   Ht 5' 9.5" (1.765 m)    Wt 142 lb (64.4 kg)    BMI 20.67 kg/m   Opioid Risk Score:   Fall Risk Score:  `1  Depression screen PHQ 2/9  Depression screen [facility] 2/9 11/03/2021 05/11/2021 01/27/2021 12/24/2020 03/19/2020  Decreased Interest 0 0 0 0 0  Down, Depressed, Hopeless 0 0 0 0 0  PHQ - 2 Score 0 0 0 0 0  Altered sleeping 0 - - - -  Tired, decreased energy 0 - - 0 -  Change in appetite 0 - - 0 -  Feeling bad or failure about yourself  0 - - 0 -  Trouble concentrating 0 - - 0 -  Moving slowly or fidgety/restless 0 - - 0 -  Suicidal thoughts 0 - - 0 -  PHQ-9 Score 0 - - - -  Some recent data might be hidden    Review of Systems  Neurological:  Positive for weakness and numbness.  All other systems reviewed and are negative.     Objective:   Physical Exam  Awake, alert, appears older than last visit; appropriate, sitting on table; some ecchymoses noted in L>R hands/Ue's.  Wearing splint to wear during daytime by OT Neuro: LUE tone- MAS of 1+ to 2 in L shoulder; 2-3 in L elbow; 3 in L wrist and 2 in L fingers; cannot fully extend L 4th digit.  Hofmfan's brisk in LUE  MS: LUE- biceps 4-/5; triceps 4/5; WE 2/5- complicated due to tone; Grip 2/5; and FA 2-/5 LLE- 5/5 in LLE in HF, KE, KF DF and PF     Assessment & Plan:   Patient is a 70 yr old male with R MCA CVA s/p loop recorder with hx of prostate CA s/p 3 yr ago; HTN, HLD, low back pain and LUE edema here for f/u. Stroke was 2/21. Here for f/u on Stroke.  S/p Botox on LUE 5/31- as above by Dr Letta Pate.  Has Botox scheduled for tomorrow 11/24/21- Dr Letta Pate.  Also new dx of liver cancer- on chemo-   NO ESTIM!!!! No Saebo splint can be used at this time due to new Cancer dx.    2. Botox tomorrow- for LUE- to improve ROM, not strength.  Kicks in at day 5 and maximal effect at 2 weeks.   3. Using putty  some- please try and go back to using after Botox.   4. Spasticity has probably hit pretty close to maximal effect- I/e likely won't continue to get worse anymore- but would still do Botox q3-6 months for LUE.    5. F/U 6 months.    I spent a total of 21 minutes on pt- discussing new dx of cancer. And spasticity education.

## 2021-11-23 NOTE — Telephone Encounter (Signed)
Patient is requesting a phone call back from [place]. The phone call is pertaining to his blood pressure medication.  Patient could be contacted at [phone].  Please advise.

## 2021-11-23 NOTE — Patient Instructions (Signed)
Patient is a 70 yr old male with R MCA CVA s/p loop recorder with hx of prostate CA s/p 3 yr ago; HTN, HLD, low back pain and LUE edema here for f/u. Stroke was 2/21. Here for f/u on Stroke.  S/p Botox on LUE 5/31- as above by Dr Letta Pate.  Has Botox scheduled for tomorrow 11/24/21- Dr Letta Pate.  Also new dx of liver cancer- on chemo-   NO ESTIM!!!! No Saebo splint can be used at this time due to new Cancer dx.    2. Botox tomorrow- for LUE- to improve ROM, not strength.  Kicks in at day 5 and maximal effect at 2 weeks.   3. Using putty some- please try and go back to using after Botox.   4. Spasticity has probably hit pretty close to maximal effect- I/e likely won't continue to get worse anymore- but would still do Botox q3-6 months for LUE.    5. F/U 6 months.

## 2021-11-24 ENCOUNTER — Encounter: Payer: PPO | Admitting: Physical Medicine & Rehabilitation

## 2021-11-24 ENCOUNTER — Encounter: Payer: Self-pay | Admitting: Physical Medicine & Rehabilitation

## 2021-11-24 VITALS — BP 115/71 | HR 71 | Temp 98.5°F | Ht 69.5 in | Wt 141.2 lb

## 2021-11-24 DIAGNOSIS — G8194 Hemiplegia, unspecified affecting left nondominant side: Secondary | ICD-10-CM | POA: Diagnosis not present

## 2021-11-24 DIAGNOSIS — G8110 Spastic hemiplegia affecting unspecified side: Secondary | ICD-10-CM | POA: Diagnosis not present

## 2021-11-24 NOTE — Progress Notes (Signed)
Dysport Injection for spasticity using needle EMG guidance  Dilution: 200 Units/ml Indication: Severe spasticity which interferes with ADL,mobility and/or  hygiene and is unresponsive to medication management and other conservative care Informed consent was obtained after describing risks and benefits of the procedure with the patient. This includes bleeding, bruising, infection, excessive weakness, or medication side effects. A REMS form is on file and signed. Needle:  27g 1" needle electrode Number of units per muscle LEFT FCR 200 FDS 100 PT 100 FDP 100 All injections were done after obtaining appropriate EMG activity and after negative drawback for blood. The patient tolerated the procedure well. Post procedure instructions were given. A followup appointment was made.

## 2021-11-24 NOTE — Telephone Encounter (Signed)
Spoke with patient and he wasn't sure if he should stop is blood pressure medication.  He's blood pressure was 115/76.  Advised patient not to stop medication unless Dr Jerilee Hoh approves.  Patient verbally agrees.

## 2021-11-24 NOTE — Patient Instructions (Signed)

## 2021-11-26 DIAGNOSIS — Z5111 Encounter for antineoplastic chemotherapy: Secondary | ICD-10-CM | POA: Diagnosis not present

## 2021-11-26 DIAGNOSIS — C801 Malignant (primary) neoplasm, unspecified: Secondary | ICD-10-CM | POA: Diagnosis not present

## 2021-11-26 DIAGNOSIS — C221 Intrahepatic bile duct carcinoma: Secondary | ICD-10-CM | POA: Diagnosis not present

## 2021-11-26 DIAGNOSIS — Z79899 Other long term (current) drug therapy: Secondary | ICD-10-CM | POA: Diagnosis not present

## 2021-11-26 DIAGNOSIS — G8194 Hemiplegia, unspecified affecting left nondominant side: Secondary | ICD-10-CM | POA: Diagnosis not present

## 2021-12-04 DIAGNOSIS — R590 Localized enlarged lymph nodes: Secondary | ICD-10-CM | POA: Diagnosis not present

## 2021-12-04 DIAGNOSIS — C240 Malignant neoplasm of extrahepatic bile duct: Secondary | ICD-10-CM | POA: Diagnosis not present

## 2021-12-10 DIAGNOSIS — Z8673 Personal history of transient ischemic attack (TIA), and cerebral infarction without residual deficits: Secondary | ICD-10-CM | POA: Diagnosis not present

## 2021-12-10 DIAGNOSIS — Z800 Family history of malignant neoplasm of digestive organs: Secondary | ICD-10-CM | POA: Diagnosis not present

## 2021-12-10 DIAGNOSIS — C221 Intrahepatic bile duct carcinoma: Secondary | ICD-10-CM | POA: Diagnosis not present

## 2021-12-10 DIAGNOSIS — Z87891 Personal history of nicotine dependence: Secondary | ICD-10-CM | POA: Diagnosis not present

## 2021-12-10 DIAGNOSIS — K5900 Constipation, unspecified: Secondary | ICD-10-CM | POA: Diagnosis not present

## 2021-12-10 DIAGNOSIS — Z8546 Personal history of malignant neoplasm of prostate: Secondary | ICD-10-CM | POA: Diagnosis not present

## 2021-12-10 DIAGNOSIS — Z5111 Encounter for antineoplastic chemotherapy: Secondary | ICD-10-CM | POA: Diagnosis not present

## 2021-12-17 DIAGNOSIS — Z5111 Encounter for antineoplastic chemotherapy: Secondary | ICD-10-CM | POA: Diagnosis not present

## 2021-12-17 DIAGNOSIS — C221 Intrahepatic bile duct carcinoma: Secondary | ICD-10-CM | POA: Diagnosis not present

## 2021-12-19 ENCOUNTER — Other Ambulatory Visit: Payer: Self-pay | Admitting: Internal Medicine

## 2021-12-21 ENCOUNTER — Ambulatory Visit (INDEPENDENT_AMBULATORY_CARE_PROVIDER_SITE_OTHER): Payer: PPO

## 2021-12-21 DIAGNOSIS — I63519 Cerebral infarction due to unspecified occlusion or stenosis of unspecified middle cerebral artery: Secondary | ICD-10-CM | POA: Diagnosis not present

## 2021-12-21 LAB — CUP PACEART REMOTE DEVICE CHECK
Date Time Interrogation Session: 20230115230855
Implantable Pulse Generator Implant Date: 20210222

## 2021-12-23 ENCOUNTER — Telehealth: Payer: Self-pay | Admitting: Internal Medicine

## 2021-12-23 NOTE — Telephone Encounter (Signed)
Pt is calling he saw of TV yesterday he is  taking krill oil and side effect  long term of taking krill oil can affect flexibility  of joints. Pt take for heart maintenance. Please advise

## 2021-12-23 NOTE — Telephone Encounter (Signed)
LMTCB

## 2021-12-24 DIAGNOSIS — I69354 Hemiplegia and hemiparesis following cerebral infarction affecting left non-dominant side: Secondary | ICD-10-CM | POA: Diagnosis not present

## 2021-12-24 DIAGNOSIS — Z6821 Body mass index (BMI) 21.0-21.9, adult: Secondary | ICD-10-CM | POA: Diagnosis not present

## 2021-12-24 DIAGNOSIS — C228 Malignant neoplasm of liver, primary, unspecified as to type: Secondary | ICD-10-CM | POA: Diagnosis not present

## 2021-12-24 DIAGNOSIS — F339 Major depressive disorder, recurrent, unspecified: Secondary | ICD-10-CM | POA: Diagnosis not present

## 2021-12-24 DIAGNOSIS — E46 Unspecified protein-calorie malnutrition: Secondary | ICD-10-CM | POA: Diagnosis not present

## 2021-12-24 DIAGNOSIS — Z9181 History of falling: Secondary | ICD-10-CM | POA: Diagnosis not present

## 2021-12-27 DIAGNOSIS — C801 Malignant (primary) neoplasm, unspecified: Secondary | ICD-10-CM | POA: Diagnosis not present

## 2021-12-27 DIAGNOSIS — G8194 Hemiplegia, unspecified affecting left nondominant side: Secondary | ICD-10-CM | POA: Diagnosis not present

## 2021-12-29 DIAGNOSIS — C221 Intrahepatic bile duct carcinoma: Secondary | ICD-10-CM | POA: Diagnosis not present

## 2021-12-29 DIAGNOSIS — K831 Obstruction of bile duct: Secondary | ICD-10-CM | POA: Diagnosis not present

## 2021-12-29 DIAGNOSIS — Z4689 Encounter for fitting and adjustment of other specified devices: Secondary | ICD-10-CM | POA: Diagnosis not present

## 2021-12-30 NOTE — Progress Notes (Signed)
Carelink Summary Report / Loop Recorder 

## 2021-12-30 NOTE — Telephone Encounter (Signed)
Pt notified of PCP response & verb understanding. Denies ques/concerns at this time.

## 2021-12-30 NOTE — Telephone Encounter (Signed)
Patient is requesting a phone call back at [phone].  Please advise.

## 2021-12-31 ENCOUNTER — Telehealth: Payer: Self-pay | Admitting: Internal Medicine

## 2021-12-31 DIAGNOSIS — Z79899 Other long term (current) drug therapy: Secondary | ICD-10-CM | POA: Diagnosis not present

## 2021-12-31 DIAGNOSIS — C240 Malignant neoplasm of extrahepatic bile duct: Secondary | ICD-10-CM | POA: Diagnosis not present

## 2021-12-31 DIAGNOSIS — Z9689 Presence of other specified functional implants: Secondary | ICD-10-CM | POA: Diagnosis not present

## 2021-12-31 DIAGNOSIS — C221 Intrahepatic bile duct carcinoma: Secondary | ICD-10-CM | POA: Diagnosis not present

## 2021-12-31 DIAGNOSIS — Z800 Family history of malignant neoplasm of digestive organs: Secondary | ICD-10-CM | POA: Diagnosis not present

## 2021-12-31 NOTE — Telephone Encounter (Signed)
Patient called in requesting a refill for Hydrocodone to be sent to his pharmacy but I do not see this in his chart. Patient demanded for this to be done today. Informed him that provider would get to it once she gets a chance.  Patient could be contacted at [phone].  Please advise.

## 2022-01-01 NOTE — Telephone Encounter (Signed)
Upon review of medication list, hydrocodone is not an active medication on the list; has not been prescribed by PCP since 11/16/19. Medication is also not showing up as a recent prescription by any provider in the controlled substance database.  Pt notified of above. Says he has new pain management needs that he wants to speak to Dr Jerilee Hoh about. Pt offered in person visit, declines stating he is immobile. Offered video visit & states he wants to talk to provider on the phone. Phone visit scheduled.

## 2022-01-04 ENCOUNTER — Telehealth (INDEPENDENT_AMBULATORY_CARE_PROVIDER_SITE_OTHER): Payer: PPO | Admitting: Internal Medicine

## 2022-01-04 ENCOUNTER — Other Ambulatory Visit: Payer: Self-pay | Admitting: Internal Medicine

## 2022-01-04 ENCOUNTER — Encounter: Payer: Self-pay | Admitting: Internal Medicine

## 2022-01-04 VITALS — Ht 69.0 in | Wt 145.0 lb

## 2022-01-04 DIAGNOSIS — G8929 Other chronic pain: Secondary | ICD-10-CM

## 2022-01-04 DIAGNOSIS — C221 Intrahepatic bile duct carcinoma: Secondary | ICD-10-CM

## 2022-01-04 MED ORDER — HYDROCODONE-ACETAMINOPHEN 5-325 MG PO TABS: 1.0000 | ORAL_TABLET | Freq: Four times a day (QID) | ORAL | 0 refills | Status: DC | PRN

## 2022-01-04 NOTE — Progress Notes (Signed)
Virtual Visit via Telephone Note  I connected with Ruben Chavez on 01/04/22 at  9:00 AM EST by telephone and verified that I am speaking with the correct person using two identifiers.   I discussed the limitations, risks, security and privacy concerns of performing an evaluation and management service by telephone and the availability of in person appointments. I also discussed with the patient that there may be a patient responsible charge related to this service. The patient expressed understanding and agreed to proceed.  Location patient: home Location provider: work office Participants present for the call: patient, provider Patient did not have a visit in the prior 7 days to address this/these issue(s).   History of Present Illness:  He has scheduled this visit to discuss prescription of a narcotic.  He has been diagnosed with cholangiocarcinoma and states that his ongoing chemotherapy in addition to fatigue causes a lot of chronic pain issues.  He also had a stroke 3 years ago and still suffers from a multitude of left sided chronic pain and spasticity issues.   Observations/Objective: Patient sounds cheerful and well on the phone. I do not appreciate any increased work of breathing. Speech and thought processing are grossly intact. Patient reported vitals: None reported   Current Outpatient Medications:    ASPIRIN LOW DOSE 81 MG EC tablet, Take 81 mg by mouth daily., Disp: , Rfl:    Baclofen 5 MG TABS, Take 4 tables daily, Disp: 360 tablet, Rfl: 1   buPROPion (WELLBUTRIN XL) 150 MG 24 hr tablet, Take 150 mg by mouth daily as needed., Disp: , Rfl:    buPROPion (WELLBUTRIN XL) 150 MG 24 hr tablet, Take 1 tablet by mouth daily., Disp: , Rfl:    folic acid (FOLVITE) 1 MG tablet, Take 1 mg by mouth daily., Disp: , Rfl:    HYDROcodone-acetaminophen (NORCO) 5-325 MG tablet, Take 1 tablet by mouth every 6 (six) hours as needed for moderate pain., Disp: 60 tablet, Rfl: 0    KRILL OIL PO, Take 1 tablet by mouth daily., Disp: , Rfl:    lactulose (CHRONULAC) 10 GM/15ML solution, SMARTSIG:30 Milliliter(s) By Mouth Twice Daily PRN, Disp: , Rfl:    lidocaine (XYLOCAINE) 5 % ointment, Apply thin layer to port site 30-60 minutes prior to accessing., Disp: , Rfl:    lidocaine-prilocaine (EMLA) cream, Apply thin layer to port 30-60 minutes prior to accessing., Disp: , Rfl:    lisinopril-hydrochlorothiazide (ZESTORETIC) 20-25 MG tablet, TAKE 1 TABLET BY MOUTH DAILY, Disp: 90 tablet, Rfl: 0   Magnesium 250 MG TABS, Take by mouth., Disp: , Rfl:    Multiple Vitamin (MULTIVITAMIN) capsule, Take 1 capsule by mouth daily., Disp: , Rfl:    ondansetron (ZOFRAN) 8 MG tablet, Take 8 mg by mouth 3 (three) times daily., Disp: , Rfl:    prochlorperazine (COMPAZINE) 10 MG tablet, Take by mouth., Disp: , Rfl:    senna (SENOKOT) 8.6 MG tablet, Take 2 tablets by mouth daily., Disp: , Rfl:    traZODone (DESYREL) 100 MG tablet, TAKE 1 TABLET(100 MG) BY MOUTH AT BEDTIME AS NEEDED FOR SLEEP, Disp: 90 tablet, Rfl: 1  Review of Systems:  Constitutional: Denies fever, chills, diaphoresis, appetite change. HEENT: Denies photophobia, eye pain, redness, hearing loss, ear pain, congestion, sore throat, rhinorrhea, sneezing, mouth sores, trouble swallowing, neck pain, neck stiffness and tinnitus.   Respiratory: Denies SOB, DOE, cough, chest tightness,  and wheezing.   Cardiovascular: Denies chest pain, palpitations and leg swelling.  Gastrointestinal:  Denies nausea, vomiting, abdominal pain, diarrhea, constipation, blood in stool and abdominal distention.  Genitourinary: Denies dysuria, urgency, frequency, hematuria, flank pain and difficulty urinating.  Endocrine: Denies: hot or cold intolerance, sweats, changes in hair or nails, polyuria, polydipsia. Musculoskeletal: Positive for myalgias, back pain, joint swelling, arthralgias and gait problem.  Skin: Denies pallor, rash and wound.  Neurological:  Denies dizziness, seizures, syncope, weakness, light-headedness, numbness and headaches.  Hematological: Denies adenopathy. Easy bruising, personal or family bleeding history  Psychiatric/Behavioral: Denies suicidal ideation, mood changes, confusion, nervousness, sleep disturbance and agitation   Assessment and Plan:  Cholangiocarcinoma ([place]) Other chronic pain  -PDMP reviewed, no red flags, overdose risk or is 150. -I will prescribe Norco 5/325 mg to take 1 tablet every 6 hours as needed for pain limited for now to 60 tablets a month.    I discussed the assessment and treatment plan with the patient. The patient was provided an opportunity to ask questions and all were answered. The patient agreed with the plan and demonstrated an understanding of the instructions.   The patient was advised to call back or seek an in-person evaluation if the symptoms worsen or if the condition fails to improve as anticipated.  I provided 14 minutes of non-face-to-face time during this encounter.   Lelon Frohlich, MD [facility] at [facility]

## 2022-01-05 ENCOUNTER — Telehealth: Payer: Self-pay

## 2022-01-05 NOTE — Telephone Encounter (Signed)
Pt states that pharmacy says they do not have a Hydrocodone order. Confirmed that it was sent to correct pharmacy on 01/04/22 & they received it at 9:18am.  Pharmacy staff say a PA is required. States pt can get 7days worth for approx $2-3, while we are working on [place]. Another prescription may be needed.  Pt notified of above.  Will work on [employer]. Will notify pt of determination in approx 3-4days. Pt verb understanding.

## 2022-01-06 NOTE — Telephone Encounter (Signed)
Attempted to start PA with current insurance on file:   Information regarding your request Patient not eligible (does not have coverage with the PBM/payer)  Called pharmacy to verify pt prescription plan info. States PRX#[id number] PCN#[id number]  PA started in CoverMyMeds: Key: [id number]

## 2022-01-07 DIAGNOSIS — C221 Intrahepatic bile duct carcinoma: Secondary | ICD-10-CM | POA: Diagnosis not present

## 2022-01-07 DIAGNOSIS — Z4682 Encounter for fitting and adjustment of non-vascular catheter: Secondary | ICD-10-CM | POA: Diagnosis not present

## 2022-01-08 ENCOUNTER — Telehealth: Payer: Self-pay | Admitting: Internal Medicine

## 2022-01-08 ENCOUNTER — Encounter: Payer: PPO | Attending: Physical Medicine and Rehabilitation | Admitting: Physical Medicine & Rehabilitation

## 2022-01-08 DIAGNOSIS — R252 Cramp and spasm: Secondary | ICD-10-CM | POA: Insufficient documentation

## 2022-01-08 DIAGNOSIS — I69398 Other sequelae of cerebral infarction: Secondary | ICD-10-CM | POA: Insufficient documentation

## 2022-01-08 DIAGNOSIS — G8110 Spastic hemiplegia affecting unspecified side: Secondary | ICD-10-CM | POA: Insufficient documentation

## 2022-01-08 DIAGNOSIS — G8194 Hemiplegia, unspecified affecting left nondominant side: Secondary | ICD-10-CM | POA: Insufficient documentation

## 2022-01-08 NOTE — Telephone Encounter (Signed)
Pt call and want to know why he didn't get a psa and want a call back because he ask for one.

## 2022-01-11 NOTE — Telephone Encounter (Signed)
Okay to add PSA?

## 2022-01-11 NOTE — Telephone Encounter (Signed)
Patient informed of the message below.

## 2022-01-11 NOTE — Telephone Encounter (Signed)
Patient also stated he was awaiting a refill on Hydrocodone as PCP only sent in a 6-day supply and wanted to know what we could do to expedite this. I informed the patient a prior authorization was sent (per prior phone call note on 1/31), pending review for coverage or denial by the insurance and there is nothing we can do to expedite this.  I advised the patient he can contact his insurance if he prefers as the request was initially sent by our office via [email or web address].

## 2022-01-14 DIAGNOSIS — Z5111 Encounter for antineoplastic chemotherapy: Secondary | ICD-10-CM | POA: Diagnosis not present

## 2022-01-14 DIAGNOSIS — C221 Intrahepatic bile duct carcinoma: Secondary | ICD-10-CM | POA: Diagnosis not present

## 2022-01-19 NOTE — Telephone Encounter (Signed)
Outcome: Electronic Prior Authorization not supported. Submit via other methods  Next Steps:The plan will fax you a determination, typically within 1 to 5 business days. Follow up may be needed at [place] [employer] Coverage Determination Form Prior Authorization for [place] [employer] Medicare Members. [phone] [phone]

## 2022-01-21 DIAGNOSIS — C221 Intrahepatic bile duct carcinoma: Secondary | ICD-10-CM | POA: Diagnosis not present

## 2022-01-21 DIAGNOSIS — Z5111 Encounter for antineoplastic chemotherapy: Secondary | ICD-10-CM | POA: Diagnosis not present

## 2022-01-23 LAB — CUP PACEART REMOTE DEVICE CHECK
Date Time Interrogation Session: 20230217230356
Implantable Pulse Generator Implant Date: 20210222

## 2022-01-25 ENCOUNTER — Ambulatory Visit (INDEPENDENT_AMBULATORY_CARE_PROVIDER_SITE_OTHER): Payer: PPO

## 2022-01-25 DIAGNOSIS — I63519 Cerebral infarction due to unspecified occlusion or stenosis of unspecified middle cerebral artery: Secondary | ICD-10-CM | POA: Diagnosis not present

## 2022-01-26 ENCOUNTER — Telehealth: Payer: Self-pay | Admitting: Internal Medicine

## 2022-01-26 ENCOUNTER — Other Ambulatory Visit: Payer: Self-pay | Admitting: Internal Medicine

## 2022-01-26 DIAGNOSIS — C240 Malignant neoplasm of extrahepatic bile duct: Secondary | ICD-10-CM

## 2022-01-26 MED ORDER — HYDROCODONE-ACETAMINOPHEN 5-325 MG PO TABS
1.0000 | ORAL_TABLET | Freq: Four times a day (QID) | ORAL | 0 refills | Status: DC | PRN
Start: 2022-01-26 — End: 2022-03-11

## 2022-01-26 NOTE — Telephone Encounter (Signed)
Patient is requesting a call back about the hydrocodone. Patient also wants to know if he should get the pfizer booster shot.  Please advise.

## 2022-01-26 NOTE — Telephone Encounter (Signed)
Mieh from [facility] call and stated they need a new RX for HYDROcodone-acetaminophen ([place]) 5-325 MG table for pt.

## 2022-01-26 NOTE — Telephone Encounter (Signed)
Patient states he is still waiting for his hydrocodone to be approved my insurance.  He is requesting a call back on this matter.

## 2022-01-26 NOTE — Telephone Encounter (Signed)
Okay to fill? 

## 2022-01-27 DIAGNOSIS — C801 Malignant (primary) neoplasm, unspecified: Secondary | ICD-10-CM | POA: Diagnosis not present

## 2022-01-27 DIAGNOSIS — G8194 Hemiplegia, unspecified affecting left nondominant side: Secondary | ICD-10-CM | POA: Diagnosis not present

## 2022-01-27 NOTE — Telephone Encounter (Signed)
Rx was sent  

## 2022-01-27 NOTE — Telephone Encounter (Signed)
Patient is aware 

## 2022-01-27 NOTE — Telephone Encounter (Signed)
Left message on machine for patient to return our call 

## 2022-01-28 NOTE — Progress Notes (Signed)
Carelink Summary Report / Loop Recorder 

## 2022-02-04 DIAGNOSIS — R110 Nausea: Secondary | ICD-10-CM | POA: Diagnosis not present

## 2022-02-04 DIAGNOSIS — R531 Weakness: Secondary | ICD-10-CM | POA: Diagnosis not present

## 2022-02-04 DIAGNOSIS — C221 Intrahepatic bile duct carcinoma: Secondary | ICD-10-CM | POA: Diagnosis not present

## 2022-02-04 DIAGNOSIS — Z800 Family history of malignant neoplasm of digestive organs: Secondary | ICD-10-CM | POA: Diagnosis not present

## 2022-02-04 DIAGNOSIS — Z5111 Encounter for antineoplastic chemotherapy: Secondary | ICD-10-CM | POA: Diagnosis not present

## 2022-02-04 DIAGNOSIS — Z8546 Personal history of malignant neoplasm of prostate: Secondary | ICD-10-CM | POA: Diagnosis not present

## 2022-02-04 DIAGNOSIS — Z87891 Personal history of nicotine dependence: Secondary | ICD-10-CM | POA: Diagnosis not present

## 2022-02-04 DIAGNOSIS — Z8673 Personal history of transient ischemic attack (TIA), and cerebral infarction without residual deficits: Secondary | ICD-10-CM | POA: Diagnosis not present

## 2022-02-11 DIAGNOSIS — C221 Intrahepatic bile duct carcinoma: Secondary | ICD-10-CM | POA: Diagnosis not present

## 2022-02-11 DIAGNOSIS — Z5111 Encounter for antineoplastic chemotherapy: Secondary | ICD-10-CM | POA: Diagnosis not present

## 2022-02-24 DIAGNOSIS — G8194 Hemiplegia, unspecified affecting left nondominant side: Secondary | ICD-10-CM | POA: Diagnosis not present

## 2022-02-24 DIAGNOSIS — C801 Malignant (primary) neoplasm, unspecified: Secondary | ICD-10-CM | POA: Diagnosis not present

## 2022-02-25 DIAGNOSIS — Z5111 Encounter for antineoplastic chemotherapy: Secondary | ICD-10-CM | POA: Diagnosis not present

## 2022-02-25 DIAGNOSIS — C221 Intrahepatic bile duct carcinoma: Secondary | ICD-10-CM | POA: Diagnosis not present

## 2022-03-01 ENCOUNTER — Ambulatory Visit (INDEPENDENT_AMBULATORY_CARE_PROVIDER_SITE_OTHER): Payer: PPO

## 2022-03-01 DIAGNOSIS — C240 Malignant neoplasm of extrahepatic bile duct: Secondary | ICD-10-CM | POA: Diagnosis not present

## 2022-03-01 DIAGNOSIS — I63519 Cerebral infarction due to unspecified occlusion or stenosis of unspecified middle cerebral artery: Secondary | ICD-10-CM

## 2022-03-01 DIAGNOSIS — C221 Intrahepatic bile duct carcinoma: Secondary | ICD-10-CM | POA: Diagnosis not present

## 2022-03-01 DIAGNOSIS — R918 Other nonspecific abnormal finding of lung field: Secondary | ICD-10-CM | POA: Diagnosis not present

## 2022-03-01 DIAGNOSIS — R591 Generalized enlarged lymph nodes: Secondary | ICD-10-CM | POA: Diagnosis not present

## 2022-03-01 DIAGNOSIS — Z4682 Encounter for fitting and adjustment of non-vascular catheter: Secondary | ICD-10-CM | POA: Diagnosis not present

## 2022-03-02 LAB — CUP PACEART REMOTE DEVICE CHECK
Date Time Interrogation Session: 20230324230701
Date Time Interrogation Session: 20230324230701
Implantable Pulse Generator Implant Date: 20210222
Implantable Pulse Generator Implant Date: 20210222

## 2022-03-04 DIAGNOSIS — C221 Intrahepatic bile duct carcinoma: Secondary | ICD-10-CM | POA: Diagnosis not present

## 2022-03-04 DIAGNOSIS — Z8546 Personal history of malignant neoplasm of prostate: Secondary | ICD-10-CM | POA: Diagnosis not present

## 2022-03-04 DIAGNOSIS — Z5111 Encounter for antineoplastic chemotherapy: Secondary | ICD-10-CM | POA: Diagnosis not present

## 2022-03-04 DIAGNOSIS — Z87891 Personal history of nicotine dependence: Secondary | ICD-10-CM | POA: Diagnosis not present

## 2022-03-04 DIAGNOSIS — R7989 Other specified abnormal findings of blood chemistry: Secondary | ICD-10-CM | POA: Diagnosis not present

## 2022-03-04 DIAGNOSIS — Z800 Family history of malignant neoplasm of digestive organs: Secondary | ICD-10-CM | POA: Diagnosis not present

## 2022-03-04 DIAGNOSIS — Z5112 Encounter for antineoplastic immunotherapy: Secondary | ICD-10-CM | POA: Diagnosis not present

## 2022-03-04 DIAGNOSIS — Z8673 Personal history of transient ischemic attack (TIA), and cerebral infarction without residual deficits: Secondary | ICD-10-CM | POA: Diagnosis not present

## 2022-03-10 NOTE — Progress Notes (Signed)
Carelink Summary Report / Loop Recorder 

## 2022-03-11 ENCOUNTER — Other Ambulatory Visit: Payer: Self-pay | Admitting: Internal Medicine

## 2022-03-11 DIAGNOSIS — C240 Malignant neoplasm of extrahepatic bile duct: Secondary | ICD-10-CM

## 2022-03-11 MED ORDER — HYDROCODONE-ACETAMINOPHEN 5-325 MG PO TABS: 1.0000 | ORAL_TABLET | Freq: Four times a day (QID) | ORAL | 0 refills | Status: DC | PRN

## 2022-03-11 NOTE — Telephone Encounter (Signed)
1st refill was printed and shredded. ?2nd prescription sent. ?

## 2022-03-11 NOTE — Addendum Note (Signed)
Addended by: Westley Hummer B on: 03/11/2022 02:00 PM ? ? Modules accepted: Orders ? ?

## 2022-03-11 NOTE — Telephone Encounter (Signed)
01/04/22 last office visit.  Okay to refill? ?

## 2022-03-11 NOTE — Telephone Encounter (Signed)
Patient called in requesting a medication refill for HYDROcodone-acetaminophen (NORCO) 5-325 MG tablet [[id number]]  to be sent to his pharmacy. ? ?Patient is requesting a phone call today when this is done. ? ?Please advise. ?

## 2022-03-11 NOTE — Telephone Encounter (Signed)
Refill sent.

## 2022-03-17 DIAGNOSIS — C228 Malignant neoplasm of liver, primary, unspecified as to type: Secondary | ICD-10-CM | POA: Diagnosis not present

## 2022-03-17 DIAGNOSIS — F339 Major depressive disorder, recurrent, unspecified: Secondary | ICD-10-CM | POA: Diagnosis not present

## 2022-03-17 DIAGNOSIS — Z515 Encounter for palliative care: Secondary | ICD-10-CM | POA: Diagnosis not present

## 2022-03-17 DIAGNOSIS — Z6821 Body mass index (BMI) 21.0-21.9, adult: Secondary | ICD-10-CM | POA: Diagnosis not present

## 2022-03-17 DIAGNOSIS — E46 Unspecified protein-calorie malnutrition: Secondary | ICD-10-CM | POA: Diagnosis not present

## 2022-03-18 DIAGNOSIS — C221 Intrahepatic bile duct carcinoma: Secondary | ICD-10-CM | POA: Diagnosis not present

## 2022-03-18 DIAGNOSIS — Z5111 Encounter for antineoplastic chemotherapy: Secondary | ICD-10-CM | POA: Diagnosis not present

## 2022-03-19 ENCOUNTER — Telehealth: Payer: Self-pay | Admitting: Internal Medicine

## 2022-03-19 NOTE — Telephone Encounter (Signed)
Pt is calling and would like md to return his call pt would not elaborate the reason for the call . Pt said its not urgent he is aware md will be back in office on monday ?

## 2022-03-22 NOTE — Telephone Encounter (Signed)
Patient spoke with a nurse at [facility].  Patient states that he has broken capillaries that look like bruises.  The nurse advised him to stop his ASA 81 mg.  Patient has been taking Asprin due to a stroke.  Please advise.  ?

## 2022-03-22 NOTE — Telephone Encounter (Signed)
Patient is aware and will continue his Asprin daily. ?

## 2022-03-22 NOTE — Telephone Encounter (Signed)
Left message on machine for patient to return our call 

## 2022-03-25 DIAGNOSIS — Z5111 Encounter for antineoplastic chemotherapy: Secondary | ICD-10-CM | POA: Diagnosis not present

## 2022-03-25 DIAGNOSIS — C221 Intrahepatic bile duct carcinoma: Secondary | ICD-10-CM | POA: Diagnosis not present

## 2022-03-26 ENCOUNTER — Telehealth: Payer: Self-pay | Admitting: Internal Medicine

## 2022-03-26 DIAGNOSIS — R079 Chest pain, unspecified: Secondary | ICD-10-CM

## 2022-03-26 NOTE — Telephone Encounter (Signed)
Pt call and stated he want dr.Hernandez to call him back wouldn't tell me anything he said it is Important . ?

## 2022-03-26 NOTE — Telephone Encounter (Signed)
Rep from [facility] called and stated that pt called them to sched an appt about his chest pain however pt hasn't been with them in a few years and would need a new referral sent from his PCP. Rep asked if Dr. Jerilee Hoh can send a referral to [facility] for Dr. Johnsie Cancel. Rep stated that pt refused to give [place] a call to ask for the referral.  ? ?Please advise.  ?

## 2022-03-27 DIAGNOSIS — G8194 Hemiplegia, unspecified affecting left nondominant side: Secondary | ICD-10-CM | POA: Diagnosis not present

## 2022-03-27 DIAGNOSIS — C801 Malignant (primary) neoplasm, unspecified: Secondary | ICD-10-CM | POA: Diagnosis not present

## 2022-03-29 ENCOUNTER — Other Ambulatory Visit: Payer: Self-pay | Admitting: Internal Medicine

## 2022-03-29 NOTE — Telephone Encounter (Signed)
Referral placed.

## 2022-03-29 NOTE — Telephone Encounter (Signed)
Okay to refer? 

## 2022-04-01 LAB — CUP PACEART REMOTE DEVICE CHECK
Date Time Interrogation Session: 20230426231425
Implantable Pulse Generator Implant Date: 20210222

## 2022-04-01 NOTE — Progress Notes (Signed)
CARDIOLOGY CONSULT NOTE  ? ? ? ? ? ?Patient ID: ?Ruben Chavez ?MRN: [id number] ?DOB/AGE: 72/09/1951 72 y.o. ? ?Admit date: (Not on file) ?Referring Physician: Jerilee Hoh ?Primary Physician: Isaac Bliss, Rayford Halsted, MD ?Primary Cardiologist: New not seen over 2 years ?EP  Lovena Le  ?Reason for Consultation: Chest pain ? ? ? ?HPI:  71 y.o. referred back to general cardiology by Dr Jerilee Hoh for chest pain. Last seen by NP 2021 after CVA with left sided weakness field cut and facial droop R MCA occlusion post tPA and IR recanalization No PAF in hospital ILR placed TTE normal EF 60-65% Rx with ASA/Plavix History of HTN, HLD, prostate cancer with seed implant and smoking TEE at time of stroke normal negative bubble study 01/28/20 ? ?In interim has been diagnosed with choangiocarcinoma with chemotherapy Has chronic pain on left side from stroke and fatigue and prescribed Norco 12/2021 ILR with no PAF  ? ?Hist cancer is being cared for by [facility] Appears to have had port placement and ERCP's there ? ?He had one episdoed of pain during Cisplatin infusion Also anemic and required transfusion Has had chemo since last August and only one episode of pain  ? ?ROS ?All other systems reviewed and negative except as noted above ? ?Past Medical History:  ?Diagnosis Date  ?? Elevated PSA   ?? Hyperlipidemia   ? pt says not anymore  ?? Hypertension   ?? Liver cancer ([place]) 2022  ? [facility]  ?? Prostate cancer [facility]) 2013  ?  ?Family History  ?Problem Relation Age of Onset  ?? Pancreatic cancer Mother   ?     small bowel cancer  ?? Heart disease Father   ?? Colon cancer Neg Hx   ?? Stomach cancer Neg Hx   ?  ?Social History  ? ?Socioeconomic History  ?? Marital status: Married  ?  Spouse name: [name]  ?? Number of children: Not on file  ?? Years of education: Not on file  ?? Highest education level: Not on file  ?Occupational History  ?? Not on file  ?Tobacco Use  ?? Smoking status: Former  ?  Packs/day: 1.00  ?  Years:  32.00  ?  Pack years: 32.00  ?  Types: Cigarettes  ?  Quit date: 06/06/1999  ?  Years since quitting: 22.8  ?? Smokeless tobacco: Never  ?Vaping Use  ?? Vaping Use: Never used  ?Substance and Sexual Activity  ?? Alcohol use: Not Currently  ?  Alcohol/week: 14.0 standard drinks  ?  Types: 14 Shots of liquor per week  ?? Drug use: No  ?? Sexual activity: Yes  ?Other Topics Concern  ?? Not on file  ?Social History Narrative  ?? Not on file  ? ?Social Determinants of Health  ? ?Financial Resource Strain: Not on file  ?Food Insecurity: Not on file  ?Transportation Needs: Not on file  ?Physical Activity: Not on file  ?Stress: Not on file  ?Social Connections: Not on file  ?Intimate Partner Violence: Not on file  ?  ?Past Surgical History:  ?Procedure Laterality Date  ?? bilateral inguinal hernia  2008  ?? BILIARY BRUSHING  06/23/2021  ? Procedure: BILIARY BRUSHING;  Surgeon: Rush Landmark Telford Nab., MD;  Location: [facility];  Service: Gastroenterology;;  ?? BILIARY DILATION  06/23/2021  ? Procedure: BILIARY DILATION;  Surgeon: Rush Landmark Telford Nab., MD;  Location: [facility];  Service: Gastroenterology;;  ?? BILIARY STENT PLACEMENT N/A 06/23/2021  ? Procedure: BILIARY STENT PLACEMENT;  Surgeon: Irving Copas., MD;  Location: [facility];  Service: Gastroenterology;  Laterality: N/A;  ?? BIOPSY  06/23/2021  ? Procedure: BIOPSY;  Surgeon: Irving Copas., MD;  Location: [facility];  Service: Gastroenterology;;  ?? BUBBLE STUDY  01/28/2020  ? Procedure: BUBBLE STUDY;  Surgeon: [name], MD;  Location: [facility];  Service: Cardiovascular;;  ?? CYST REMOVAL TRUNK  2016  ? sebaceous cyst  ?? CYSTOSCOPY  04/22/2016  ? Procedure: CYSTOSCOPY;  Surgeon: [name], MD;  Location[facility];  Service: Urology;;  ?? ERCP N/A 06/23/2021  ? Procedure: ENDOSCOPIC RETROGRADE CHOLANGIOPANCREATOGRAPHY (ERCP);  Surgeon: Irving Copas., MD;  Location: [facility];  Service:  Gastroenterology;  Laterality: N/A;  ?? IR ANGIO INTRA EXTRACRAN SEL COM CAROTID INNOMINATE BILAT MOD SED  05/30/2020  ?? IR ANGIO VERTEBRAL SEL SUBCLAVIAN INNOMINATE UNI R MOD SED  01/25/2020  ?? IR ANGIO VERTEBRAL SEL VERTEBRAL BILAT MOD SED  05/30/2020  ?? IR CT HEAD LTD  01/25/2020  ?? IR PERCUTANEOUS ART THROMBECTOMY/INFUSION INTRACRANIAL INC DIAG ANGIO  01/25/2020  ?? IR US GUIDE VASC ACCESS RIGHT  05/30/2020  ?? KNEE ARTHROSCOPY  2007  ? right  ?? LOOP RECORDER INSERTION N/A 01/28/2020  ? Procedure: LOOP RECORDER INSERTION;  Surgeon: [name], MD;  Location: [facility];  Service: Cardiovascular;  Laterality: N/A;  ?? PANCREATIC STENT PLACEMENT  06/23/2021  ? Procedure: PANCREATIC STENT PLACEMENT;  Surgeon: Rush Landmark Telford Nab., MD;  Location: [facility];  Service: Gastroenterology;;  ?? PROSTATE BIOPSY  2008  ?? PROSTATE BIOPSY  2013  ?? PROSTATE BIOPSY  01/28/2016  ?? RADIOACTIVE SEED IMPLANT N/A 04/22/2016  ? Procedure: RADIOACTIVE SEED IMPLANT/BRACHYTHERAPY IMPLANT;  Surgeon: [name], MD;  Location: [facility];  Service: Urology;  Laterality: N/A;  ?? RADIOLOGY WITH ANESTHESIA N/A 01/24/2020  ? Procedure: IR WITH ANESTHESIA;  Surgeon: [name], MD;  Location: [facility];  Service: Radiology;  Laterality: N/A;  ?? REMOVAL OF STONES  06/23/2021  ? Procedure: REMOVAL OF STONES;  Surgeon: Rush Landmark Telford Nab., MD;  Location: [facility];  Service: Gastroenterology;;  ?? SPHINCTEROTOMY  06/23/2021  ? Procedure: SPHINCTEROTOMY;  Surgeon: Rush Landmark Telford Nab., MD;  Location: [facility];  Service: Gastroenterology;;  ?? TEE WITHOUT CARDIOVERSION N/A 01/28/2020  ? Procedure: TRANSESOPHAGEAL ECHOCARDIOGRAM (TEE);  Surgeon: [name], MD;  Location: [facility]COPY;  Service: Cardiovascular;  Laterality: N/A;  ?? TONSILLECTOMY    ?? TOTAL HIP ARTHROPLASTY  2001  ? left  ?? TOTAL HIP ARTHROPLASTY Right 01/18/2018  ? Procedure: RIGHT TOTAL HIP ARTHROPLASTY ANTERIOR  APPROACH;  Surgeon: [name], MD;  Location: [facility];  Service: Orthopedics;  Laterality: Right;  ?  ? ? ?Current Outpatient Medications:  ??  ASPIRIN LOW DOSE 81 MG EC tablet, Take 81 mg by mouth daily., Disp: , Rfl:  ??  Baclofen 5 MG TABS, Take 4 tables daily, Disp: 360 tablet, Rfl: 1 ??  buPROPion (WELLBUTRIN XL) 150 MG 24 hr tablet, Take 150 mg by mouth daily as needed., Disp: , Rfl:  ??  buPROPion (WELLBUTRIN XL) 150 MG 24 hr tablet, Take 1 tablet by mouth daily., Disp: , Rfl:  ??  folic acid (FOLVITE) 1 MG tablet, Take 1 tablet (1 mg total) by mouth daily., Disp: 90 tablet, Rfl: 1 ??  HYDROcodone-acetaminophen (NORCO) 5-325 MG tablet, Take 1 tablet by mouth every 6 (six) hours as needed for moderate pain., Disp: 60 tablet, Rfl: 0 ??  KRILL OIL PO,  Take 1 tablet by mouth daily., Disp: , Rfl:  ??  lactulose (CHRONULAC) 10 GM/15ML solution, SMARTSIG:30 Milliliter(s) By Mouth Twice Daily PRN, Disp: , Rfl:  ??  lidocaine (XYLOCAINE) 5 % ointment, Apply thin layer to port site 30-60 minutes prior to accessing., Disp: , Rfl:  ??  lidocaine-prilocaine (EMLA) cream, Apply thin layer to port 30-60 minutes prior to accessing., Disp: , Rfl:  ??  lisinopril-hydrochlorothiazide (ZESTORETIC) 20-25 MG tablet, TAKE 1 TABLET BY MOUTH DAILY, Disp: 90 tablet, Rfl: 0 ??  Magnesium 250 MG TABS, Take by mouth., Disp: , Rfl:  ??  Multiple Vitamin (MULTIVITAMIN) capsule, Take 1 capsule by mouth daily., Disp: , Rfl:  ??  ondansetron (ZOFRAN) 8 MG tablet, Take 8 mg by mouth 3 (three) times daily., Disp: , Rfl:  ??  prochlorperazine (COMPAZINE) 10 MG tablet, Take by mouth., Disp: , Rfl:  ??  senna (SENOKOT) 8.6 MG tablet, Take 2 tablets by mouth daily., Disp: , Rfl:  ??  traZODone (DESYREL) 100 MG tablet, TAKE 1 TABLET(100 MG) BY MOUTH AT BEDTIME, Disp: 90 tablet, Rfl: 1 ? ? ? ?Physical Exam: ?Blood pressure 130/70, pulse 74, height '5\' 9"'$  (1.753 m), weight 135 lb (61.2 kg), SpO2 98 %.   ? ?Affect appropriate ?Healthy:  appears  stated age ?HEENT: normal ?Neck supple with no adenopathy ?JVP normal no bruits no thyromegaly ?Lungs clear with no wheezing and good diaphragmatic motion ?Heart:  S1/S2 no murmur, no rub, gallop or click ?PMI normal

## 2022-04-05 ENCOUNTER — Ambulatory Visit: Payer: PPO | Admitting: Cardiovascular Disease

## 2022-04-05 ENCOUNTER — Ambulatory Visit (INDEPENDENT_AMBULATORY_CARE_PROVIDER_SITE_OTHER): Payer: PPO

## 2022-04-05 ENCOUNTER — Encounter: Payer: Self-pay | Admitting: Cardiovascular Disease

## 2022-04-05 VITALS — BP 130/70 | HR 74 | Ht 69.0 in | Wt 135.0 lb

## 2022-04-05 DIAGNOSIS — R079 Chest pain, unspecified: Secondary | ICD-10-CM

## 2022-04-05 DIAGNOSIS — I63519 Cerebral infarction due to unspecified occlusion or stenosis of unspecified middle cerebral artery: Secondary | ICD-10-CM | POA: Diagnosis not present

## 2022-04-05 NOTE — Patient Instructions (Addendum)
Medication Instructions:  ?Your physician recommends that you continue on your current medications as directed. Please refer to the Current Medication list given to you today. ? ?*If you need a refill on your cardiac medications before your next appointment, please call your pharmacy* ? ?Lab Work: ?If you have labs (blood work) drawn today and your tests are completely normal, you will receive your results only by: ?MyChart Message (if you have MyChart) OR ?A paper copy in the mail ?If you have any lab test that is abnormal or we need to change your treatment, we will call you to review the results. ? ?Testing/Procedures: ?Cardiac PET  ? ? ?Follow-Up: ?At [facility], you and your health needs are our priority.  As part of our continuing mission to provide you with exceptional heart care, we have created designated Provider Care Teams.  These Care Teams include your primary Cardiologist (physician) and Advanced Practice Providers (APPs -  Physician Assistants and Nurse Practitioners) who all work together to provide you with the care you need, when you need it. ? ?We recommend signing up for the patient portal called "MyChart".  Sign up information is provided on this After Visit Summary.  MyChart is used to connect with patients for Virtual Visits (Telemedicine).  Patients are able to view lab/test results, encounter notes, upcoming appointments, etc.  Non-urgent messages can be sent to your provider as well.   ?To learn more about what you can do with MyChart, go to [email or web address].   ? ?Your next appointment:   ?1 year(s) ? ?The format for your next appointment:   ?In Person ? ?Provider:   ?Jenkins Rouge, MD { ? ? ? ?Other Instructions ?How to Prepare for Your Cardiac PET/CT Stress Test: ? ?1. Please take your medications before your test:  with water. ? ?2. Nothing to eat or drink, except water, 3 hours prior to arrival time.   ?NO caffeine/decaffeinated products, or chocolate 12 hours prior to  arrival. ? ?3. NO perfume, cologne or lotion ? ?4. Total time is 1 to 2 hours; you may want to bring reading material for the waiting time. ? ?5. Please report to Admitting at the [place] Entrance 60 minutes early for your test. ? [address] ? [place], [place] [address] ? ?In preparation for your appointment, medication and supplies will be purchased.  Appointment availability is limited, so if you need to cancel or reschedule, please call the Radiology Department at [phone]  24 hours in advance to avoid a cancellation fee of $100.00 ? ?What to Expect After you Arrive: ? ?Once you arrive and check in for your appointment, you will be taken to a preparation room within the Radiology Department.  A technologist or Nurse will obtain your medical history, verify that you are correctly prepped for the exam, and explain the procedure.  Afterwards,  an IV will be started in your arm and electrodes will be placed on your skin for EKG monitoring during the stress portion of the exam. Then you will be escorted to the PET/CT scanner.  There, staff will get you positioned on the scanner and obtain a blood pressure and EKG.  During the exam, you will continue to be connected to the EKG and blood pressure machines.  A small, safe amount of a radioactive tracer will be injected in your IV to obtain a series of pictures of your heart along with an injection of a stress agent.   ? ?After your Exam: ? ?  It is recommended that you eat a meal and drink a caffeinated beverage to counter act any effects of the stress agent.  Drink plenty of fluids for the remainder of the day and urinate frequently for the first couple of hours after the exam.  Your doctor will inform you of your test results within 7-10 business days. ? ?For questions about your test or how to prepare for your test, please call: ?Marchia Bond, Cardiac Imaging Nurse Navigator  ?Gordy Clement, Cardiac Imaging Nurse Navigator ?Office:  [phone]  ? ?Important Information About Sugar ? ? ? ? ?  ?

## 2022-04-06 DIAGNOSIS — Z79899 Other long term (current) drug therapy: Secondary | ICD-10-CM | POA: Diagnosis not present

## 2022-04-06 DIAGNOSIS — Z4659 Encounter for fitting and adjustment of other gastrointestinal appliance and device: Secondary | ICD-10-CM | POA: Diagnosis not present

## 2022-04-06 DIAGNOSIS — C221 Intrahepatic bile duct carcinoma: Secondary | ICD-10-CM | POA: Diagnosis not present

## 2022-04-06 DIAGNOSIS — K831 Obstruction of bile duct: Secondary | ICD-10-CM | POA: Diagnosis not present

## 2022-04-08 DIAGNOSIS — R079 Chest pain, unspecified: Secondary | ICD-10-CM | POA: Diagnosis not present

## 2022-04-08 DIAGNOSIS — Z5111 Encounter for antineoplastic chemotherapy: Secondary | ICD-10-CM | POA: Diagnosis not present

## 2022-04-08 DIAGNOSIS — R7989 Other specified abnormal findings of blood chemistry: Secondary | ICD-10-CM | POA: Diagnosis not present

## 2022-04-08 DIAGNOSIS — Z800 Family history of malignant neoplasm of digestive organs: Secondary | ICD-10-CM | POA: Diagnosis not present

## 2022-04-08 DIAGNOSIS — M25439 Effusion, unspecified wrist: Secondary | ICD-10-CM | POA: Diagnosis not present

## 2022-04-08 DIAGNOSIS — C221 Intrahepatic bile duct carcinoma: Secondary | ICD-10-CM | POA: Diagnosis not present

## 2022-04-08 DIAGNOSIS — M7989 Other specified soft tissue disorders: Secondary | ICD-10-CM | POA: Diagnosis not present

## 2022-04-08 DIAGNOSIS — Z8546 Personal history of malignant neoplasm of prostate: Secondary | ICD-10-CM | POA: Diagnosis not present

## 2022-04-08 DIAGNOSIS — Z87891 Personal history of nicotine dependence: Secondary | ICD-10-CM | POA: Diagnosis not present

## 2022-04-15 DIAGNOSIS — D630 Anemia in neoplastic disease: Secondary | ICD-10-CM | POA: Diagnosis not present

## 2022-04-15 DIAGNOSIS — C221 Intrahepatic bile duct carcinoma: Secondary | ICD-10-CM | POA: Diagnosis not present

## 2022-04-15 DIAGNOSIS — Z5111 Encounter for antineoplastic chemotherapy: Secondary | ICD-10-CM | POA: Diagnosis not present

## 2022-04-16 ENCOUNTER — Telehealth: Payer: Self-pay | Admitting: Internal Medicine

## 2022-04-16 NOTE — Telephone Encounter (Signed)
Pt is calling and would like to up the trazodone mg and please sent to [place],  - [place] ?

## 2022-04-19 ENCOUNTER — Other Ambulatory Visit: Payer: Self-pay | Admitting: Internal Medicine

## 2022-04-19 DIAGNOSIS — C240 Malignant neoplasm of extrahepatic bile duct: Secondary | ICD-10-CM

## 2022-04-19 MED ORDER — HYDROCODONE-ACETAMINOPHEN 5-325 MG PO TABS: 1.0000 | ORAL_TABLET | Freq: Four times a day (QID) | ORAL | 0 refills | Status: DC | PRN

## 2022-04-19 NOTE — Progress Notes (Signed)
Carelink Summary Report / Loop Recorder 

## 2022-04-19 NOTE — Telephone Encounter (Signed)
Patient called request to increase dosage of  Trazodone from '100mg'$ , and to have hydro 5/325 refilled... ? ?Please call back [phone] ?

## 2022-04-20 MED ORDER — TRAZODONE HCL 100 MG PO TABS: ORAL_TABLET | ORAL | 1 refills | Status: DC

## 2022-04-20 NOTE — Telephone Encounter (Signed)
Spoke with patient.  He states that a couple of nights a week he has to take 2 Trazodone  100 mg to go to sleep.  Patient would like to know if there is anything else he can try, or is it okay for him to take 2 Trazodone ? ?

## 2022-04-20 NOTE — Telephone Encounter (Signed)
Ok to take 2  100 mg trazodone tabs occasionally if needed. Per Dr Jerilee Hoh. ?Patient is aware. ?

## 2022-04-20 NOTE — Addendum Note (Signed)
Addended by: Westley Hummer B on: 04/20/2022 08:49 AM ? ? Modules accepted: Orders ? ?

## 2022-04-26 DIAGNOSIS — G8194 Hemiplegia, unspecified affecting left nondominant side: Secondary | ICD-10-CM | POA: Diagnosis not present

## 2022-04-26 DIAGNOSIS — C801 Malignant (primary) neoplasm, unspecified: Secondary | ICD-10-CM | POA: Diagnosis not present

## 2022-04-28 ENCOUNTER — Other Ambulatory Visit: Payer: Self-pay | Admitting: Internal Medicine

## 2022-04-29 DIAGNOSIS — Z8546 Personal history of malignant neoplasm of prostate: Secondary | ICD-10-CM | POA: Diagnosis not present

## 2022-04-29 DIAGNOSIS — Z5111 Encounter for antineoplastic chemotherapy: Secondary | ICD-10-CM | POA: Diagnosis not present

## 2022-04-29 DIAGNOSIS — C221 Intrahepatic bile duct carcinoma: Secondary | ICD-10-CM | POA: Diagnosis not present

## 2022-04-29 DIAGNOSIS — Z79899 Other long term (current) drug therapy: Secondary | ICD-10-CM | POA: Diagnosis not present

## 2022-04-29 DIAGNOSIS — Z8673 Personal history of transient ischemic attack (TIA), and cerebral infarction without residual deficits: Secondary | ICD-10-CM | POA: Diagnosis not present

## 2022-04-29 DIAGNOSIS — M25439 Effusion, unspecified wrist: Secondary | ICD-10-CM | POA: Diagnosis not present

## 2022-04-29 DIAGNOSIS — R079 Chest pain, unspecified: Secondary | ICD-10-CM | POA: Diagnosis not present

## 2022-04-29 DIAGNOSIS — Z800 Family history of malignant neoplasm of digestive organs: Secondary | ICD-10-CM | POA: Diagnosis not present

## 2022-04-29 DIAGNOSIS — Z87891 Personal history of nicotine dependence: Secondary | ICD-10-CM | POA: Diagnosis not present

## 2022-05-06 DIAGNOSIS — C221 Intrahepatic bile duct carcinoma: Secondary | ICD-10-CM | POA: Diagnosis not present

## 2022-05-06 DIAGNOSIS — Z5111 Encounter for antineoplastic chemotherapy: Secondary | ICD-10-CM | POA: Diagnosis not present

## 2022-05-06 DIAGNOSIS — D630 Anemia in neoplastic disease: Secondary | ICD-10-CM | POA: Diagnosis not present

## 2022-05-07 ENCOUNTER — Other Ambulatory Visit: Payer: Self-pay | Admitting: Internal Medicine

## 2022-05-09 LAB — CUP PACEART REMOTE DEVICE CHECK
Date Time Interrogation Session: 20230602230304
Implantable Pulse Generator Implant Date: 20210222

## 2022-05-10 ENCOUNTER — Ambulatory Visit (INDEPENDENT_AMBULATORY_CARE_PROVIDER_SITE_OTHER): Payer: PPO

## 2022-05-10 DIAGNOSIS — I63519 Cerebral infarction due to unspecified occlusion or stenosis of unspecified middle cerebral artery: Secondary | ICD-10-CM

## 2022-05-18 ENCOUNTER — Other Ambulatory Visit: Payer: Self-pay | Admitting: Internal Medicine

## 2022-05-18 DIAGNOSIS — C240 Malignant neoplasm of extrahepatic bile duct: Secondary | ICD-10-CM

## 2022-05-18 DIAGNOSIS — H6121 Impacted cerumen, right ear: Secondary | ICD-10-CM | POA: Diagnosis not present

## 2022-05-18 MED ORDER — HYDROCODONE-ACETAMINOPHEN 5-325 MG PO TABS: 1.0000 | ORAL_TABLET | Freq: Four times a day (QID) | ORAL | 0 refills | Status: DC | PRN

## 2022-05-18 NOTE — Telephone Encounter (Signed)
Requesting refill of HYDROcodone-acetaminophen (NORCO) 5-325 MG tablet   [facility] [place], [place] [address] Phone:  [phone]  Fax:  [phone]

## 2022-05-18 NOTE — Telephone Encounter (Signed)
Refill sent.

## 2022-05-20 DIAGNOSIS — Z5111 Encounter for antineoplastic chemotherapy: Secondary | ICD-10-CM | POA: Diagnosis not present

## 2022-05-20 DIAGNOSIS — C221 Intrahepatic bile duct carcinoma: Secondary | ICD-10-CM | POA: Diagnosis not present

## 2022-05-24 DIAGNOSIS — Z4682 Encounter for fitting and adjustment of non-vascular catheter: Secondary | ICD-10-CM | POA: Diagnosis not present

## 2022-05-24 DIAGNOSIS — C221 Intrahepatic bile duct carcinoma: Secondary | ICD-10-CM | POA: Diagnosis not present

## 2022-05-26 NOTE — Progress Notes (Signed)
Carelink Summary Report / Loop Recorder 

## 2022-05-27 DIAGNOSIS — G8194 Hemiplegia, unspecified affecting left nondominant side: Secondary | ICD-10-CM | POA: Diagnosis not present

## 2022-05-27 DIAGNOSIS — C801 Malignant (primary) neoplasm, unspecified: Secondary | ICD-10-CM | POA: Diagnosis not present

## 2022-05-28 DIAGNOSIS — Z5111 Encounter for antineoplastic chemotherapy: Secondary | ICD-10-CM | POA: Diagnosis not present

## 2022-05-28 DIAGNOSIS — C221 Intrahepatic bile duct carcinoma: Secondary | ICD-10-CM | POA: Diagnosis not present

## 2022-05-28 DIAGNOSIS — D630 Anemia in neoplastic disease: Secondary | ICD-10-CM | POA: Diagnosis not present

## 2022-05-31 ENCOUNTER — Encounter: Payer: PPO | Attending: Physical Medicine and Rehabilitation | Admitting: Physical Medicine and Rehabilitation

## 2022-06-03 DIAGNOSIS — F0153 Vascular dementia, unspecified severity, with mood disturbance: Secondary | ICD-10-CM | POA: Diagnosis not present

## 2022-06-03 DIAGNOSIS — Z9221 Personal history of antineoplastic chemotherapy: Secondary | ICD-10-CM | POA: Diagnosis not present

## 2022-06-03 DIAGNOSIS — I999 Unspecified disorder of circulatory system: Secondary | ICD-10-CM | POA: Diagnosis not present

## 2022-06-03 DIAGNOSIS — C221 Intrahepatic bile duct carcinoma: Secondary | ICD-10-CM | POA: Diagnosis not present

## 2022-06-07 DIAGNOSIS — Z79899 Other long term (current) drug therapy: Secondary | ICD-10-CM | POA: Diagnosis not present

## 2022-06-07 DIAGNOSIS — C221 Intrahepatic bile duct carcinoma: Secondary | ICD-10-CM | POA: Diagnosis not present

## 2022-06-08 ENCOUNTER — Other Ambulatory Visit: Payer: Self-pay

## 2022-06-08 ENCOUNTER — Encounter (HOSPITAL_BASED_OUTPATIENT_CLINIC_OR_DEPARTMENT_OTHER): Payer: Self-pay | Admitting: Urology

## 2022-06-08 ENCOUNTER — Emergency Department (HOSPITAL_BASED_OUTPATIENT_CLINIC_OR_DEPARTMENT_OTHER): Payer: PPO

## 2022-06-08 ENCOUNTER — Emergency Department (HOSPITAL_BASED_OUTPATIENT_CLINIC_OR_DEPARTMENT_OTHER)
Admission: EM | Admit: 2022-06-08 | Discharge: 2022-06-08 | Disposition: A | Discharge status: Home / Self Care | Payer: PPO | Attending: Emergency Medicine | Admitting: Emergency Medicine

## 2022-06-08 DIAGNOSIS — W19XXXA Unspecified fall, initial encounter: Secondary | ICD-10-CM | POA: Diagnosis not present

## 2022-06-08 DIAGNOSIS — S61412A Laceration without foreign body of left hand, initial encounter: Secondary | ICD-10-CM | POA: Diagnosis not present

## 2022-06-08 DIAGNOSIS — M7989 Other specified soft tissue disorders: Secondary | ICD-10-CM | POA: Diagnosis not present

## 2022-06-08 DIAGNOSIS — Z79899 Other long term (current) drug therapy: Secondary | ICD-10-CM | POA: Diagnosis not present

## 2022-06-08 DIAGNOSIS — W07XXXA Fall from chair, initial encounter: Secondary | ICD-10-CM | POA: Insufficient documentation

## 2022-06-08 DIAGNOSIS — S51812A Laceration without foreign body of left forearm, initial encounter: Secondary | ICD-10-CM | POA: Insufficient documentation

## 2022-06-08 DIAGNOSIS — S51819A Laceration without foreign body of unspecified forearm, initial encounter: Secondary | ICD-10-CM

## 2022-06-08 DIAGNOSIS — R609 Edema, unspecified: Secondary | ICD-10-CM | POA: Diagnosis not present

## 2022-06-08 DIAGNOSIS — M19032 Primary osteoarthritis, left wrist: Secondary | ICD-10-CM | POA: Diagnosis not present

## 2022-06-08 DIAGNOSIS — M79632 Pain in left forearm: Secondary | ICD-10-CM | POA: Diagnosis not present

## 2022-06-08 DIAGNOSIS — S61512A Laceration without foreign body of left wrist, initial encounter: Secondary | ICD-10-CM | POA: Diagnosis not present

## 2022-06-08 DIAGNOSIS — S6992XA Unspecified injury of left wrist, hand and finger(s), initial encounter: Secondary | ICD-10-CM | POA: Diagnosis present

## 2022-06-08 MED ORDER — CEPHALEXIN 500 MG PO CAPS: 500.0000 mg | ORAL_CAPSULE | Freq: Two times a day (BID) | ORAL | 0 refills | Status: DC

## 2022-06-08 MED ORDER — CEPHALEXIN 250 MG PO CAPS
500.0000 mg | ORAL_CAPSULE | Freq: Once | ORAL | Status: AC
  Administered 2022-06-08: 500 mg via ORAL
  Filled 2022-06-08: qty 2

## 2022-06-08 NOTE — ED Triage Notes (Signed)
Pt brought by EMS for mechanical fall Obvious deformity to left wrist and hand  Skin tear to left hand noted Denies any pain, Denies hitting head H/o stroke that affects left arm

## 2022-06-08 NOTE — ED Provider Notes (Signed)
[facility] EMERGENCY DEPARTMENT Provider Note   CSN: [id number] Arrival date & time: 06/08/22  2018     History  Chief Complaint  Patient presents with   Ruben Chavez is a 71 y.o. male history of cholangiocarcinoma status post biliary tube, previous stroke with left-sided weakness here presenting with fall.  Patient states that he was sitting on the [place] chair and the chair tipped over and he landed on his left wrist area.  He was noted to have deformity of the left wrist and also skin tear.  Patient's tetanus is up-to-date.  Denies any head injury denies any hip injury or chest injury.  Patient states that he has chronic left hand swelling after his previous stroke.  He also is chronically weaker on the left side  The history is provided by the patient.       Home Medications Prior to Admission medications   Medication Sig Start Date End Date Taking? Authorizing Provider  ASPIRIN LOW DOSE 81 MG EC tablet Take 81 mg by mouth daily. 08/06/21   [provider]  Baclofen 5 MG TABS Take 4 tables daily 11/03/21   Isaac Bliss, Rayford Halsted, MD  buPROPion (WELLBUTRIN XL) 150 MG 24 hr tablet Take 150 mg by mouth daily as needed. 07/24/21   [provider]  buPROPion (WELLBUTRIN XL) 150 MG 24 hr tablet Take 1 tablet by mouth daily. 11/10/21   [provider]  folic acid (FOLVITE) 1 MG tablet TAKE 1 TABLET(1 MG) BY MOUTH DAILY 04/28/22   Isaac Bliss, Rayford Halsted, MD  HYDROcodone-acetaminophen (NORCO) 5-325 MG tablet Take 1 tablet by mouth every 6 (six) hours as needed for moderate pain. 05/18/22   Isaac Bliss, Rayford Halsted, MD  KRILL OIL PO Take 1 tablet by mouth daily.    [provider]  lactulose (CHRONULAC) 10 GM/15ML solution SMARTSIG:30 Milliliter(s) By Mouth Twice Daily PRN 10/23/21   [provider]  lidocaine (XYLOCAINE) 5 % ointment Apply thin layer to port site 30-60 minutes prior to accessing. 08/17/21    [provider]  lidocaine-prilocaine (EMLA) cream Apply thin layer to port 30-60 minutes prior to accessing. 08/13/21   [provider]  lisinopril-hydrochlorothiazide (ZESTORETIC) 20-25 MG tablet TAKE 1 TABLET BY MOUTH DAILY 12/22/21   Isaac Bliss, Rayford Halsted, MD  Magnesium 250 MG TABS Take by mouth.    [provider]  Multiple Vitamin (MULTIVITAMIN) capsule Take 1 capsule by mouth daily.    [provider]  ondansetron (ZOFRAN) 8 MG tablet Take 8 mg by mouth 3 (three) times daily. 11/02/21   [provider]  pantoprazole (PROTONIX) 40 MG tablet TAKE 1 TABLET(40 MG) BY MOUTH DAILY 05/10/22   Isaac Bliss, Rayford Halsted, MD  prochlorperazine (COMPAZINE) 10 MG tablet Take by mouth. 11/24/21   [provider]  senna (SENOKOT) 8.6 MG tablet Take 2 tablets by mouth daily.    [provider]  traZODone (DESYREL) 100 MG tablet Take 2 tabs at bedtime as needed 04/20/22   Isaac Bliss, Rayford Halsted, MD      Allergies    Sulfa antibiotics, Advil [ibuprofen], Aleve [naproxen], Betadine [povidone iodine], Chloroxylenol (antiseptic), Poison ivy extract, and Povidone-iodine    Review of Systems   Review of Systems  Musculoskeletal:        Left wrist pain  Skin:  Positive for wound.  All other systems reviewed and are negative.   Physical Exam Updated Vital Signs BP 140/88 (BP  Location: Right Arm)   Pulse 70   Temp 97.8 F (36.6 C) (Oral)   Resp 18   Ht '5\' 9"'$  (1.753 m)   Wt 61.2 kg   SpO2 100%   BMI 19.92 kg/m  Physical Exam Vitals and nursing note reviewed.  Constitutional:      Comments: Chronically ill  HENT:     Head: Normocephalic.     Nose: Nose normal.     Mouth/Throat:     Mouth: Mucous membranes are moist.  Eyes:     Extraocular Movements: Extraocular movements intact.     Pupils: Pupils are equal, round, and reactive to light.  Cardiovascular:     Rate and Rhythm: Normal rate and regular rhythm.     Pulses:  Normal pulses.     Heart sounds: Normal heart sounds.  Pulmonary:     Effort: Pulmonary effort is normal.     Breath sounds: Normal breath sounds.     Comments: No obvious rib tenderness.  Lungs are clear bilaterally Abdominal:     General: Abdomen is flat.     Palpations: Abdomen is soft.     Comments: Biliary tube in place  Musculoskeletal:     Cervical back: Normal range of motion and neck supple.     Comments: Patient has skin tear in the left hand and wrist and forearm area.  Patient has 2+ edema of the left hand and also some edema of the left forearm and that is chronic.  Patient is able to wiggle his fingers.  No midline spinal tenderness.  No obvious hip injury  Skin:    Capillary Refill: Capillary refill takes less than 2 seconds.     Comments: Skin tear in the left forearm and hand  Neurological:     Mental Status: He is alert.     Comments: Patient has 4 out of 5 strength of the left arm and leg which is chronic.  5 out of 5 strength in the right side.  Psychiatric:        Mood and Affect: Mood normal.        Behavior: Behavior normal.     ED Results / Procedures / Treatments   Labs (all labs ordered are listed, but only abnormal results are displayed) Labs Reviewed - No data to display  EKG None  Radiology No results found.  Procedures Procedures    The wound is cleansed, debrided of foreign material as much as possible, and dressed. The patient is alerted to watch for any signs of infection (redness, pus, pain, increased swelling or fever) and call if such occurs. Home wound care instructions are provided. Tetanus vaccination status reviewed: last tetanus booster within 10 years.   Medications Ordered in ED Medications - No data to display  ED Course/ Medical Decision Making/ A&P                           Medical Decision Making Ruben Chavez is a 71 y.o. male here presenting with fall.  Patient has skin tears in the left hand and wrist area.  He is  chronically weaker on the left side after previous stroke.  Plan to get x-ray of the hand and forearm.  10:31 PM X-rays show no fracture.  Patient has multiple skin tears and I applied petroleum dressing and nonstick as well as Kerlix.  Given that he may be potentially a dirty wound, will discharge patient home with Keflex  to prevent infection  Amount and/or Complexity of Data Reviewed Radiology: ordered.  Final Clinical Impression(s) / ED Diagnoses Final diagnoses:  None    Rx / DC Orders ED Discharge Orders     None         Drenda Freeze, MD 06/08/22 2232

## 2022-06-08 NOTE — Discharge Instructions (Signed)
Take Keflex twice daily for 5 days to prevent infection  You may remove the dressing in 2 days and just keep the wound clean and dry  Expect some bruising around the wound  See your doctor for follow-up  Return to ER if you have fever, purulent drainage, severe pain

## 2022-06-08 NOTE — ED Notes (Signed)
Pt verbalizes understanding of discharge instructions. Opportunity for questioning and answers were provided. Pt discharged from ED to home with family.    

## 2022-06-09 ENCOUNTER — Encounter: Payer: Self-pay | Admitting: Internal Medicine

## 2022-06-09 ENCOUNTER — Ambulatory Visit (INDEPENDENT_AMBULATORY_CARE_PROVIDER_SITE_OTHER): Payer: PPO | Admitting: Internal Medicine

## 2022-06-09 VITALS — BP 100/60 | HR 76 | Temp 98.1°F | Ht 69.0 in | Wt 139.0 lb

## 2022-06-09 DIAGNOSIS — K831 Obstruction of bile duct: Secondary | ICD-10-CM

## 2022-06-09 DIAGNOSIS — I10 Essential (primary) hypertension: Secondary | ICD-10-CM

## 2022-06-09 DIAGNOSIS — S51812D Laceration without foreign body of left forearm, subsequent encounter: Secondary | ICD-10-CM | POA: Diagnosis not present

## 2022-06-09 DIAGNOSIS — C801 Malignant (primary) neoplasm, unspecified: Secondary | ICD-10-CM | POA: Diagnosis not present

## 2022-06-09 DIAGNOSIS — C229 Malignant neoplasm of liver, not specified as primary or secondary: Secondary | ICD-10-CM | POA: Diagnosis not present

## 2022-06-09 DIAGNOSIS — C240 Malignant neoplasm of extrahepatic bile duct: Secondary | ICD-10-CM

## 2022-06-09 DIAGNOSIS — W19XXXD Unspecified fall, subsequent encounter: Secondary | ICD-10-CM | POA: Diagnosis not present

## 2022-06-09 DIAGNOSIS — Z09 Encounter for follow-up examination after completed treatment for conditions other than malignant neoplasm: Secondary | ICD-10-CM | POA: Diagnosis not present

## 2022-06-09 DIAGNOSIS — R634 Abnormal weight loss: Secondary | ICD-10-CM

## 2022-06-09 DIAGNOSIS — Z515 Encounter for palliative care: Secondary | ICD-10-CM | POA: Diagnosis not present

## 2022-06-09 DIAGNOSIS — S61512D Laceration without foreign body of left wrist, subsequent encounter: Secondary | ICD-10-CM | POA: Diagnosis not present

## 2022-06-09 DIAGNOSIS — S61412D Laceration without foreign body of left hand, subsequent encounter: Secondary | ICD-10-CM | POA: Diagnosis not present

## 2022-06-09 NOTE — Progress Notes (Signed)
Established Patient Office Visit     CC/Reason for Visit: ED follow-up, follow-up chronic conditions  HPI: Ruben Chavez is a 71 y.o. male who is coming in today for the above mentioned reasons. Past Medical History is significant for: He has a cholangiocarcinoma currently undergoing treatment at [facility] he suffered a mechanical fall off his porch injuring his left hand.  He went to the emergency department, fortunately no fracture, however because it was a dirty wound and he had multiple skin tears it was decided to place him on Keflex.  He feels dizzy at times.  Wife tells me that his oncology team is concerned about his blood pressure medication.  Blood pressure is currently 100/60 in office.  She tells me that during chemo it routinely drops into the 90s despite fluids.  He tells me that he saw his cardiologist who recommended a stress test however this has never been scheduled.   Past Medical/Surgical History: Past Medical History:  Diagnosis Date   Elevated PSA    Hyperlipidemia    pt says not anymore   Hypertension    Liver cancer [facility]) 2022   [facility]   Prostate cancer [facility]) 2013    Past Surgical History:  Procedure Laterality Date   bilateral inguinal hernia  2008   BILIARY BRUSHING  06/23/2021   Procedure: BILIARY BRUSHING;  Surgeon: Irving Copas., MD;  Location: [facility];  Service: Gastroenterology;;   BILIARY DILATION  06/23/2021   Procedure: BILIARY DILATION;  Surgeon: Irving Copas., MD;  Location: [facility];  Service: Gastroenterology;;   BILIARY STENT PLACEMENT N/A 06/23/2021   Procedure: BILIARY STENT PLACEMENT;  Surgeon: Irving Copas., MD;  Location: [facility];  Service: Gastroenterology;  Laterality: N/A;   BIOPSY  06/23/2021   Procedure: BIOPSY;  Surgeon: Rush Landmark Telford Nab., MD;  Location: [facility];  Service: Gastroenterology;;   Kathleen Argue STUDY  01/28/2020   Procedure: BUBBLE STUDY;   Surgeon: [name], MD;  Location: [facility];  Service: Cardiovascular;;   CYST REMOVAL TRUNK  2016   sebaceous cyst   CYSTOSCOPY  04/22/2016   Procedure: CYSTOSCOPY;  Surgeon: [name], MD;  Location: [facility];  Service: Urology;;   ERCP N/A 06/23/2021   Procedure: ENDOSCOPIC RETROGRADE CHOLANGIOPANCREATOGRAPHY (ERCP);  Surgeon: Irving Copas., MD;  Location: [facility];  Service: Gastroenterology;  Laterality: N/A;   IR ANGIO INTRA EXTRACRAN SEL COM CAROTID INNOMINATE BILAT MOD SED  05/30/2020   IR ANGIO VERTEBRAL SEL SUBCLAVIAN INNOMINATE UNI R MOD SED  01/25/2020   IR ANGIO VERTEBRAL SEL VERTEBRAL BILAT MOD SED  05/30/2020   IR CT HEAD LTD  01/25/2020   IR PERCUTANEOUS ART THROMBECTOMY/INFUSION INTRACRANIAL INC DIAG ANGIO  01/25/2020   IR US GUIDE VASC ACCESS RIGHT  05/30/2020   KNEE ARTHROSCOPY  2007   right   LOOP RECORDER INSERTION N/A 01/28/2020   Procedure: LOOP RECORDER INSERTION;  Surgeon: [name], MD;  Location: [facility];  Service: Cardiovascular;  Laterality: N/A;   PANCREATIC STENT PLACEMENT  06/23/2021   Procedure: PANCREATIC STENT PLACEMENT;  Surgeon: Irving Copas., MD;  Location: [facility];  Service: Gastroenterology;;   PROSTATE BIOPSY  2008   PROSTATE BIOPSY  2013   PROSTATE BIOPSY  01/28/2016   RADIOACTIVE SEED IMPLANT N/A 04/22/2016   Procedure: RADIOACTIVE SEED IMPLANT/BRACHYTHERAPY IMPLANT;  Surgeon: [name], MD;  Location: [facility];  Service: Urology;  Laterality: N/A;  RADIOLOGY WITH ANESTHESIA N/A 01/24/2020   Procedure: IR WITH ANESTHESIA;  Surgeon: [name], MD;  Location: [facility];  Service: Radiology;  Laterality: N/A;   REMOVAL OF STONES  06/23/2021   Procedure: REMOVAL OF STONES;  Surgeon: Rush Landmark Telford Nab., MD;  Location: [facility];  Service: Gastroenterology;;   Joan Mayans  06/23/2021   Procedure: Joan Mayans;  Surgeon: Rush Landmark Telford Nab., MD;  Location: [facility];  Service: Gastroenterology;;   TEE WITHOUT CARDIOVERSION N/A 01/28/2020   Procedure: TRANSESOPHAGEAL ECHOCARDIOGRAM (TEE);  Surgeon: [name], MD;  Location: [facility];  Service: Cardiovascular;  Laterality: N/A;   TONSILLECTOMY     TOTAL HIP ARTHROPLASTY  2001   left   TOTAL HIP ARTHROPLASTY Right 01/18/2018   Procedure: RIGHT TOTAL HIP ARTHROPLASTY ANTERIOR APPROACH;  Surgeon: [name], MD;  Location: [facility];  Service: Orthopedics;  Laterality: Right;    Social History:  reports that he quit smoking about 23 years ago. His smoking use included cigarettes. He has a 32.00 pack-year smoking history. He has never used smokeless tobacco. He reports that he does not currently use alcohol after a past usage of about 14.0 standard drinks of alcohol per week. He reports that he does not use drugs.  Allergies: Allergies  Allergen Reactions   Sulfa Antibiotics Swelling and Other (See Comments)    Swelling in ankles  Swelling in ankles   Advil [Ibuprofen] Other (See Comments)    "Dots on chest" (Petechiae)   Aleve [Naproxen] Other (See Comments)    "Dots on chest" (Petechiae)   Betadine [Povidone Iodine] Itching and Rash   Chloroxylenol (Antiseptic) Itching, Rash and Other (See Comments)    PCMX surgical sterilizing scrub   Poison Ivy Extract Rash   Povidone-Iodine Itching and Rash    BETADINE BETADINE BETADINE    Family History:  Family History  Problem Relation Age of Onset   Pancreatic cancer Mother        small bowel cancer   Heart disease Father    Colon cancer Neg Hx    Stomach cancer Neg Hx      Current Outpatient Medications:    ASPIRIN LOW DOSE 81 MG EC tablet, Take 81 mg by mouth daily., Disp: , Rfl:    Baclofen 5 MG TABS, Take 4 tables daily, Disp: 360 tablet, Rfl: 1   buPROPion (WELLBUTRIN XL) 150 MG 24 hr tablet, Take 150 mg by mouth daily as needed., Disp: , Rfl:    buPROPion (WELLBUTRIN XL) 150 MG 24 hr tablet, Take  1 tablet by mouth daily., Disp: , Rfl:    cephALEXin (KEFLEX) 500 MG capsule, Take 1 capsule (500 mg total) by mouth 2 (two) times daily., Disp: 10 capsule, Rfl: 0   folic acid (FOLVITE) 1 MG tablet, TAKE 1 TABLET(1 MG) BY MOUTH DAILY, Disp: 30 tablet, Rfl: 0   HYDROcodone-acetaminophen (NORCO) 5-325 MG tablet, Take 1 tablet by mouth every 6 (six) hours as needed for moderate pain., Disp: 60 tablet, Rfl: 0   KRILL OIL PO, Take 1 tablet by mouth daily., Disp: , Rfl:    lactulose (CHRONULAC) 10 GM/15ML solution, SMARTSIG:30 Milliliter(s) By Mouth Twice Daily PRN, Disp: , Rfl:    lidocaine (XYLOCAINE) 5 % ointment, Apply thin layer to port site 30-60 minutes prior to accessing., Disp: , Rfl:    lidocaine-prilocaine (EMLA) cream, Apply thin layer to port 30-60 minutes prior to accessing., Disp: , Rfl:    Magnesium 250 MG TABS, Take by mouth., Disp: , Rfl:  Multiple Vitamin (MULTIVITAMIN) capsule, Take 1 capsule by mouth daily., Disp: , Rfl:    ondansetron (ZOFRAN) 8 MG tablet, Take 8 mg by mouth 3 (three) times daily., Disp: , Rfl:    pantoprazole (PROTONIX) 40 MG tablet, TAKE 1 TABLET(40 MG) BY MOUTH DAILY, Disp: 90 tablet, Rfl: 1   prochlorperazine (COMPAZINE) 10 MG tablet, Take by mouth., Disp: , Rfl:    senna (SENOKOT) 8.6 MG tablet, Take 2 tablets by mouth daily., Disp: , Rfl:    traZODone (DESYREL) 100 MG tablet, Take 2 tabs at bedtime as needed, Disp: 180 tablet, Rfl: 1  Review of Systems:  Constitutional: Denies fever, chills, diaphoresis. HEENT: Denies photophobia, eye pain, redness, hearing loss, ear pain, congestion, sore throat, rhinorrhea, sneezing, mouth sores, trouble swallowing, neck pain, neck stiffness and tinnitus.   Respiratory: Denies SOB, DOE, cough, chest tightness,  and wheezing.   Cardiovascular: Denies chest pain, palpitations and leg swelling.  Gastrointestinal: Denies nausea, vomiting, abdominal pain, diarrhea, constipation, blood in stool and abdominal distention.   Genitourinary: Denies dysuria, urgency, frequency, hematuria, flank pain and difficulty urinating.  Endocrine: Denies: hot or cold intolerance, sweats, changes in hair or nails, polyuria, polydipsia. Musculoskeletal: Denies myalgias, back pain, joint swelling, arthralgias and gait problem.   Psychiatric/Behavioral: Denies suicidal ideation, mood changes, confusion, nervousness, sleep disturbance and agitation    Physical Exam: Vitals:   06/09/22 1410  BP: 100/60  Pulse: 76  Temp: 98.1 F (36.7 C)  TempSrc: Oral  SpO2: 99%  Weight: 139 lb (63 kg)  Height: '5\' 9"'$  (1.753 m)    Body mass index is 20.53 kg/m.    Constitutional: NAD, calm, comfortable, thin Eyes: PERRL, lids and conjunctivae normal ENMT: Mucous membranes are moist.  Respiratory: clear to auscultation bilaterally, no wheezing, no crackles. Normal respiratory effort. No accessory muscle use.  Cardiovascular: Regular rate and rhythm, no murmurs / rubs / gallops. No extremity edema. 2+ pedal pulses. No carotid bruits.  Abdomen: no tenderness, no masses palpated. No hepatosplenomegaly. Bowel sounds positive.  Musculoskeletal: no clubbing / cyanosis. No joint deformity upper and lower extremities. Good ROM, no contractures. Normal muscle tone.  Neurologic: CN 2-12 grossly intact. Sensation intact, DTR normal. Strength 5/5 in all 4.  Psychiatric: Normal judgment and insight. Alert and oriented x 3. Normal mood.    Impression and Plan:  [place]'s tumor ([place]) Obstructive jaundice due to malignant neoplasm ([place]) Weight loss, unintentional -Currently being managed by his oncology team at [facility] hypertension -I agree with discontinuing lisinopril/hydrochlorothiazide, he will keep check of his blood pressure and notify me of any significant changes.  Hospital discharge follow-up -He will complete out course of antibiotics.  He was advised to take dressing off tomorrow, he knows to follow-up with me for any  wound changes.    Time spent:31 minutes reviewing chart, interviewing and examining patient and formulating plan of care.     Lelon Frohlich, MD [facility]

## 2022-06-10 DIAGNOSIS — Z800 Family history of malignant neoplasm of digestive organs: Secondary | ICD-10-CM | POA: Diagnosis not present

## 2022-06-10 DIAGNOSIS — Z87891 Personal history of nicotine dependence: Secondary | ICD-10-CM | POA: Diagnosis not present

## 2022-06-10 DIAGNOSIS — Z8546 Personal history of malignant neoplasm of prostate: Secondary | ICD-10-CM | POA: Diagnosis not present

## 2022-06-10 DIAGNOSIS — C240 Malignant neoplasm of extrahepatic bile duct: Secondary | ICD-10-CM | POA: Diagnosis not present

## 2022-06-10 DIAGNOSIS — Z8673 Personal history of transient ischemic attack (TIA), and cerebral infarction without residual deficits: Secondary | ICD-10-CM | POA: Diagnosis not present

## 2022-06-10 DIAGNOSIS — C221 Intrahepatic bile duct carcinoma: Secondary | ICD-10-CM | POA: Diagnosis not present

## 2022-06-10 DIAGNOSIS — Z9181 History of falling: Secondary | ICD-10-CM | POA: Diagnosis not present

## 2022-06-10 DIAGNOSIS — Z5111 Encounter for antineoplastic chemotherapy: Secondary | ICD-10-CM | POA: Diagnosis not present

## 2022-06-11 ENCOUNTER — Telehealth: Payer: Self-pay | Admitting: Internal Medicine

## 2022-06-11 ENCOUNTER — Other Ambulatory Visit: Payer: Self-pay | Admitting: Internal Medicine

## 2022-06-11 DIAGNOSIS — C240 Malignant neoplasm of extrahepatic bile duct: Secondary | ICD-10-CM

## 2022-06-11 NOTE — Telephone Encounter (Signed)
Pt called, irate, wanting to know the status of his refill. Pt was told we are waiting for the MD to sign off on it. Pt said he and his wife have been calling all day and why can't someone get it done. Pt was told his PCP is out of office & it was sent to a different doctor. I also told Pt I spoke to the Nurse who said we are working on it. Pt began yelling and said "I've been hearing that all day!! Make it happen or you are not going to like who I become when I am in pain!!" Pt also said "If I don't get it by Monday, you don't want to see the hell I will raise!!" I assured Pt again that we are working on it and as soon as the MD signs off on it, he will get it. Pt said thank you and hung up.

## 2022-06-11 NOTE — Telephone Encounter (Signed)
Pt's spouse calling in checking on status, hoping they can get this filled prior to close of business. Requestng a call to confirm

## 2022-06-11 NOTE — Telephone Encounter (Addendum)
Pt's wife called to request that the Authorization request for a refill of the: HYDROcodone-acetaminophen (NORCO) 5-325 MG tablet  Pt was here today. They just called the pharmacy and were told there is nothing received as of yet.  Please send to:  [facility] [place], [place] Phone:  [phone]  Fax:  [phone]

## 2022-06-11 NOTE — Telephone Encounter (Signed)
Last refill per controlled substance database: 05/18/22 Last OV: 06/09/22 Next OV: none scheduled

## 2022-06-13 LAB — CUP PACEART REMOTE DEVICE CHECK
Date Time Interrogation Session: 20230705231117
Implantable Pulse Generator Implant Date: 20210222

## 2022-06-13 MED ORDER — HYDROCODONE-ACETAMINOPHEN 5-325 MG PO TABS: 1.0000 | ORAL_TABLET | Freq: Four times a day (QID) | ORAL | 0 refills | Status: DC | PRN

## 2022-06-14 ENCOUNTER — Ambulatory Visit (INDEPENDENT_AMBULATORY_CARE_PROVIDER_SITE_OTHER): Payer: PPO

## 2022-06-14 DIAGNOSIS — I63519 Cerebral infarction due to unspecified occlusion or stenosis of unspecified middle cerebral artery: Secondary | ICD-10-CM

## 2022-06-23 DIAGNOSIS — I679 Cerebrovascular disease, unspecified: Secondary | ICD-10-CM | POA: Diagnosis not present

## 2022-06-23 DIAGNOSIS — F0153 Vascular dementia, unspecified severity, with mood disturbance: Secondary | ICD-10-CM | POA: Diagnosis not present

## 2022-06-23 DIAGNOSIS — I69312 Visuospatial deficit and spatial neglect following cerebral infarction: Secondary | ICD-10-CM | POA: Diagnosis not present

## 2022-06-23 DIAGNOSIS — I69392 Facial weakness following cerebral infarction: Secondary | ICD-10-CM | POA: Diagnosis not present

## 2022-06-23 DIAGNOSIS — Z9221 Personal history of antineoplastic chemotherapy: Secondary | ICD-10-CM | POA: Diagnosis not present

## 2022-06-24 DIAGNOSIS — Z5111 Encounter for antineoplastic chemotherapy: Secondary | ICD-10-CM | POA: Diagnosis not present

## 2022-06-24 DIAGNOSIS — C221 Intrahepatic bile duct carcinoma: Secondary | ICD-10-CM | POA: Diagnosis not present

## 2022-06-26 DIAGNOSIS — C801 Malignant (primary) neoplasm, unspecified: Secondary | ICD-10-CM | POA: Diagnosis not present

## 2022-06-26 DIAGNOSIS — G8194 Hemiplegia, unspecified affecting left nondominant side: Secondary | ICD-10-CM | POA: Diagnosis not present

## 2022-07-08 DIAGNOSIS — Z8546 Personal history of malignant neoplasm of prostate: Secondary | ICD-10-CM | POA: Diagnosis not present

## 2022-07-08 DIAGNOSIS — D6481 Anemia due to antineoplastic chemotherapy: Secondary | ICD-10-CM | POA: Diagnosis not present

## 2022-07-08 DIAGNOSIS — C221 Intrahepatic bile duct carcinoma: Secondary | ICD-10-CM | POA: Diagnosis not present

## 2022-07-08 DIAGNOSIS — D649 Anemia, unspecified: Secondary | ICD-10-CM | POA: Diagnosis not present

## 2022-07-08 DIAGNOSIS — Z87891 Personal history of nicotine dependence: Secondary | ICD-10-CM | POA: Diagnosis not present

## 2022-07-08 DIAGNOSIS — R5383 Other fatigue: Secondary | ICD-10-CM | POA: Diagnosis not present

## 2022-07-08 DIAGNOSIS — Z800 Family history of malignant neoplasm of digestive organs: Secondary | ICD-10-CM | POA: Diagnosis not present

## 2022-07-08 DIAGNOSIS — Z8673 Personal history of transient ischemic attack (TIA), and cerebral infarction without residual deficits: Secondary | ICD-10-CM | POA: Diagnosis not present

## 2022-07-08 DIAGNOSIS — T451X5D Adverse effect of antineoplastic and immunosuppressive drugs, subsequent encounter: Secondary | ICD-10-CM | POA: Diagnosis not present

## 2022-07-14 LAB — CUP PACEART REMOTE DEVICE CHECK
Date Time Interrogation Session: 20230807231016
Implantable Pulse Generator Implant Date: 20210222

## 2022-07-15 NOTE — Progress Notes (Signed)
Carelink Summary Report / Loop Recorder 

## 2022-08-12 ENCOUNTER — Telehealth: Payer: Self-pay | Admitting: *Deleted

## 2022-08-12 NOTE — Patient Outreach (Signed)
  Care Coordination   08/12/2022 Name: Ruben Chavez MRN: [id number] DOB: 1951/11/08   Care Coordination Outreach Attempts:  An unsuccessful telephone outreach was attempted today to offer the patient information about available care coordination services as a benefit of their health plan.   Follow Up Plan:  Additional outreach attempts will be made to offer the patient care coordination information and services.   Encounter Outcome:  No Answer  Care Coordination Interventions Activated:  No   Care Coordination Interventions:  No, not indicated  .   Raina Mina, RN Care Management Coordinator [place] Office [phone]

## 2022-08-21 LAB — CUP PACEART REMOTE DEVICE CHECK
Date Time Interrogation Session: 20230909231051
Implantable Pulse Generator Implant Date: 20210222

## 2022-08-30 ENCOUNTER — Telehealth: Payer: Self-pay | Admitting: Cardiovascular Disease

## 2022-08-30 NOTE — Telephone Encounter (Signed)
Spouse calling to make office aware that pt is now under Hospice care and will no longer be sending transmissions for device. Please advise

## 2022-08-31 NOTE — Telephone Encounter (Signed)
I spoke with the pt wife to let her know I canceled the remotes and took him out of [facility].

## 2022-08-31 NOTE — Telephone Encounter (Signed)
I cancelled all upcoming remotes. Marked him inactive in [facility] and took the patient out of [facility].

## 2022-09-03 ENCOUNTER — Telehealth: Payer: Self-pay | Admitting: *Deleted

## 2022-09-03 NOTE — Patient Outreach (Signed)
  Care Coordination   09/03/2022 Name: Ruben Chavez MRN: [id number] DOB: [birth year]   Care Coordination Outreach Attempts:  A second unsuccessful outreach was attempted today to offer the patient with information about available care coordination services as a benefit of their health plan.     Follow Up Plan:  Additional outreach attempts will be made to offer the patient care coordination information and services.   Encounter Outcome:  No Answer  Care Coordination Interventions Activated:  No   Care Coordination Interventions:  No, not indicated    Raina Mina, RN Care Management Coordinator [place] Office [phone]

## 2023-01-15 DIAGNOSIS — Z743 Need for continuous supervision: Secondary | ICD-10-CM | POA: Diagnosis not present

## 2023-01-15 DIAGNOSIS — R58 Hemorrhage, not elsewhere classified: Secondary | ICD-10-CM | POA: Diagnosis not present

## 2023-01-20 DIAGNOSIS — R531 Weakness: Secondary | ICD-10-CM | POA: Diagnosis not present

## 2023-01-20 DIAGNOSIS — Z743 Need for continuous supervision: Secondary | ICD-10-CM | POA: Diagnosis not present

## 2023-04-07 ENCOUNTER — Encounter: Payer: Self-pay | Admitting: Internal Medicine

## 2023-04-07 NOTE — Telephone Encounter (Signed)
error 

## 2023-05-07 DEATH — deceased
